# Patient Record
Sex: Male | Born: 1940
Health system: Southern US, Community
[De-identification: ages and names within clinical notes are randomized; demographics above are authoritative.]

## PROBLEM LIST (undated history)

## (undated) DIAGNOSIS — G2581 Restless legs syndrome: Secondary | ICD-10-CM

## (undated) DIAGNOSIS — M545 Low back pain, unspecified: Secondary | ICD-10-CM

## (undated) DIAGNOSIS — C4491 Basal cell carcinoma of skin, unspecified: Secondary | ICD-10-CM

## (undated) DIAGNOSIS — L859 Epidermal thickening, unspecified: Secondary | ICD-10-CM

## (undated) DIAGNOSIS — C439 Malignant melanoma of skin, unspecified: Secondary | ICD-10-CM

## (undated) DIAGNOSIS — Z973 Presence of spectacles and contact lenses: Secondary | ICD-10-CM

## (undated) DIAGNOSIS — R42 Dizziness and giddiness: Secondary | ICD-10-CM

## (undated) DIAGNOSIS — Z720 Tobacco use: Secondary | ICD-10-CM

## (undated) DIAGNOSIS — M1990 Unspecified osteoarthritis, unspecified site: Secondary | ICD-10-CM

## (undated) DIAGNOSIS — H3530 Unspecified macular degeneration: Secondary | ICD-10-CM

## (undated) DIAGNOSIS — K219 Gastro-esophageal reflux disease without esophagitis: Secondary | ICD-10-CM

## (undated) DIAGNOSIS — K5730 Diverticulosis of large intestine without perforation or abscess without bleeding: Secondary | ICD-10-CM

## (undated) DIAGNOSIS — F1020 Alcohol dependence, uncomplicated: Secondary | ICD-10-CM

## (undated) DIAGNOSIS — Z8673 Personal history of transient ischemic attack (TIA), and cerebral infarction without residual deficits: Secondary | ICD-10-CM

## (undated) DIAGNOSIS — Z972 Presence of dental prosthetic device (complete) (partial): Secondary | ICD-10-CM

## (undated) DIAGNOSIS — Z9989 Dependence on other enabling machines and devices: Secondary | ICD-10-CM

## (undated) DIAGNOSIS — N400 Enlarged prostate without lower urinary tract symptoms: Secondary | ICD-10-CM

## (undated) DIAGNOSIS — F5104 Psychophysiologic insomnia: Secondary | ICD-10-CM

## (undated) DIAGNOSIS — S42402A Unspecified fracture of lower end of left humerus, initial encounter for closed fracture: Secondary | ICD-10-CM

## (undated) DIAGNOSIS — F329 Major depressive disorder, single episode, unspecified: Secondary | ICD-10-CM

## (undated) HISTORY — DX: Diverticulosis of large intestine without perforation or abscess without bleeding: K57.30

## (undated) HISTORY — DX: Restless legs syndrome: G25.81

## (undated) HISTORY — DX: Dizziness and giddiness: R42

## (undated) HISTORY — DX: Low back pain, unspecified: M54.50

## (undated) HISTORY — DX: Epidermal thickening, unspecified: L85.9

## (undated) HISTORY — PX: CATARACT EXTRACTION: SUR2

## (undated) HISTORY — DX: Basal cell carcinoma of skin, unspecified: C44.91

## (undated) HISTORY — DX: Alcohol dependence, uncomplicated: F10.20

## (undated) HISTORY — DX: Tobacco use: Z72.0

## (undated) HISTORY — DX: Major depressive disorder, single episode, unspecified: F32.9

## (undated) HISTORY — PX: TONSILLECTOMY: SUR1361

## (undated) HISTORY — DX: Psychophysiologic insomnia: F51.04

## (undated) HISTORY — PX: OTHER SURGICAL HISTORY: SHX169

## (undated) HISTORY — DX: Personal history of transient ischemic attack (TIA), and cerebral infarction without residual deficits: Z86.73

## (undated) HISTORY — DX: Malignant melanoma of skin, unspecified: C43.9

## (undated) HISTORY — DX: Low back pain: M54.5

## (undated) HISTORY — DX: Unspecified osteoarthritis, unspecified site: M19.90

## (undated) HISTORY — DX: Unspecified fracture of lower end of left humerus, initial encounter for closed fracture: S42.402A

---

## 2001-08-13 ENCOUNTER — Ambulatory Visit (HOSPITAL_COMMUNITY): Admission: RE | Admit: 2001-08-13 | Discharge: 2001-08-13 | Payer: Self-pay | Admitting: *Deleted

## 2001-08-13 ENCOUNTER — Encounter: Payer: Self-pay | Admitting: *Deleted

## 2002-06-28 ENCOUNTER — Emergency Department (HOSPITAL_COMMUNITY): Admission: EM | Admit: 2002-06-28 | Discharge: 2002-06-28 | Payer: Self-pay | Admitting: Emergency Medicine

## 2003-02-26 ENCOUNTER — Encounter: Payer: Self-pay | Admitting: Internal Medicine

## 2003-02-26 ENCOUNTER — Ambulatory Visit (HOSPITAL_COMMUNITY): Admission: RE | Admit: 2003-02-26 | Discharge: 2003-02-26 | Payer: Self-pay | Admitting: Internal Medicine

## 2003-07-04 DIAGNOSIS — I639 Cerebral infarction, unspecified: Secondary | ICD-10-CM

## 2003-07-04 HISTORY — DX: Cerebral infarction, unspecified: I63.9

## 2003-09-24 ENCOUNTER — Ambulatory Visit (HOSPITAL_COMMUNITY): Admission: RE | Admit: 2003-09-24 | Discharge: 2003-09-24 | Payer: Self-pay | Admitting: Internal Medicine

## 2004-03-21 ENCOUNTER — Ambulatory Visit (HOSPITAL_COMMUNITY): Admission: RE | Admit: 2004-03-21 | Discharge: 2004-03-21 | Payer: Self-pay | Admitting: Internal Medicine

## 2004-09-07 ENCOUNTER — Ambulatory Visit (HOSPITAL_COMMUNITY): Admission: RE | Admit: 2004-09-07 | Discharge: 2004-09-07 | Payer: Self-pay | Admitting: Internal Medicine

## 2005-02-20 ENCOUNTER — Ambulatory Visit (HOSPITAL_COMMUNITY): Admission: RE | Admit: 2005-02-20 | Discharge: 2005-02-20 | Payer: Self-pay | Admitting: Internal Medicine

## 2005-02-23 ENCOUNTER — Ambulatory Visit: Payer: Self-pay

## 2005-05-18 ENCOUNTER — Inpatient Hospital Stay (HOSPITAL_COMMUNITY): Admission: AD | Admit: 2005-05-18 | Discharge: 2005-05-19 | Payer: Self-pay | Admitting: *Deleted

## 2005-10-13 ENCOUNTER — Ambulatory Visit (HOSPITAL_COMMUNITY): Admission: RE | Admit: 2005-10-13 | Discharge: 2005-10-13 | Payer: Self-pay | Admitting: Ophthalmology

## 2006-08-28 ENCOUNTER — Ambulatory Visit (HOSPITAL_COMMUNITY): Admission: RE | Admit: 2006-08-28 | Discharge: 2006-08-28 | Payer: Self-pay | Admitting: Internal Medicine

## 2007-07-04 DIAGNOSIS — S42402A Unspecified fracture of lower end of left humerus, initial encounter for closed fracture: Secondary | ICD-10-CM

## 2007-07-04 DIAGNOSIS — C439 Malignant melanoma of skin, unspecified: Secondary | ICD-10-CM

## 2007-07-04 HISTORY — PX: MELANOMA EXCISION: SHX5266

## 2007-07-04 HISTORY — PX: RECONSTRUCTION MEDIAL COLLATERAL LIGAMENT ELBOW W/ TENDON GRAFT: SUR1084

## 2007-07-04 HISTORY — DX: Unspecified fracture of lower end of left humerus, initial encounter for closed fracture: S42.402A

## 2007-07-04 HISTORY — DX: Malignant melanoma of skin, unspecified: C43.9

## 2007-12-19 ENCOUNTER — Ambulatory Visit: Payer: Self-pay | Admitting: Internal Medicine

## 2007-12-19 ENCOUNTER — Ambulatory Visit: Payer: Self-pay | Admitting: Cardiology

## 2007-12-19 ENCOUNTER — Inpatient Hospital Stay (HOSPITAL_COMMUNITY): Admission: EM | Admit: 2007-12-19 | Discharge: 2007-12-25 | Payer: Self-pay | Admitting: Emergency Medicine

## 2007-12-23 ENCOUNTER — Encounter (INDEPENDENT_AMBULATORY_CARE_PROVIDER_SITE_OTHER): Payer: Self-pay | Admitting: Orthopedic Surgery

## 2007-12-23 ENCOUNTER — Ambulatory Visit: Payer: Self-pay | Admitting: Vascular Surgery

## 2007-12-23 ENCOUNTER — Encounter: Payer: Self-pay | Admitting: Internal Medicine

## 2007-12-23 DIAGNOSIS — M1990 Unspecified osteoarthritis, unspecified site: Secondary | ICD-10-CM | POA: Insufficient documentation

## 2007-12-23 DIAGNOSIS — S42309A Unspecified fracture of shaft of humerus, unspecified arm, initial encounter for closed fracture: Secondary | ICD-10-CM | POA: Insufficient documentation

## 2007-12-23 DIAGNOSIS — F329 Major depressive disorder, single episode, unspecified: Secondary | ICD-10-CM

## 2007-12-23 DIAGNOSIS — G4700 Insomnia, unspecified: Secondary | ICD-10-CM | POA: Insufficient documentation

## 2007-12-23 DIAGNOSIS — F32A Depression, unspecified: Secondary | ICD-10-CM | POA: Insufficient documentation

## 2007-12-23 DIAGNOSIS — R55 Syncope and collapse: Secondary | ICD-10-CM | POA: Insufficient documentation

## 2007-12-23 DIAGNOSIS — R0989 Other specified symptoms and signs involving the circulatory and respiratory systems: Secondary | ICD-10-CM | POA: Insufficient documentation

## 2007-12-23 DIAGNOSIS — R42 Dizziness and giddiness: Secondary | ICD-10-CM | POA: Insufficient documentation

## 2007-12-23 HISTORY — DX: Unspecified fracture of shaft of humerus, unspecified arm, initial encounter for closed fracture: S42.309A

## 2007-12-26 ENCOUNTER — Telehealth (INDEPENDENT_AMBULATORY_CARE_PROVIDER_SITE_OTHER): Payer: Self-pay | Admitting: *Deleted

## 2008-01-21 ENCOUNTER — Ambulatory Visit: Payer: Self-pay | Admitting: Internal Medicine

## 2008-01-21 DIAGNOSIS — E559 Vitamin D deficiency, unspecified: Secondary | ICD-10-CM | POA: Insufficient documentation

## 2008-02-24 ENCOUNTER — Ambulatory Visit: Payer: Self-pay | Admitting: Internal Medicine

## 2008-02-24 DIAGNOSIS — K602 Anal fissure, unspecified: Secondary | ICD-10-CM | POA: Insufficient documentation

## 2008-02-24 DIAGNOSIS — R509 Fever, unspecified: Secondary | ICD-10-CM | POA: Insufficient documentation

## 2008-02-26 ENCOUNTER — Ambulatory Visit: Payer: Self-pay | Admitting: Internal Medicine

## 2008-02-26 DIAGNOSIS — N390 Urinary tract infection, site not specified: Secondary | ICD-10-CM | POA: Insufficient documentation

## 2008-02-26 LAB — CONVERTED CEMR LAB
ALT: 28 units/L (ref 0–53)
AST: 23 units/L (ref 0–37)
Albumin: 3.6 g/dL (ref 3.5–5.2)
Alkaline Phosphatase: 73 units/L (ref 39–117)
BUN: 25 mg/dL — ABNORMAL HIGH (ref 6–23)
Basophils Absolute: 0.1 10*3/uL (ref 0.0–0.1)
Basophils Relative: 1.3 % (ref 0.0–3.0)
Bilirubin, Direct: 0.3 mg/dL (ref 0.0–0.3)
CO2: 27 meq/L (ref 19–32)
Calcium: 9 mg/dL (ref 8.4–10.5)
Chloride: 107 meq/L (ref 96–112)
Creatinine, Ser: 1.6 mg/dL — ABNORMAL HIGH (ref 0.4–1.5)
Crystals: NEGATIVE
Eosinophils Absolute: 0.3 10*3/uL (ref 0.0–0.7)
Eosinophils Relative: 4.6 % (ref 0.0–5.0)
GFR calc Af Amer: 56 mL/min
GFR calc non Af Amer: 46 mL/min
Glucose, Bld: 112 mg/dL — ABNORMAL HIGH (ref 70–99)
HCT: 36.7 % — ABNORMAL LOW (ref 39.0–52.0)
Hemoglobin: 12.8 g/dL — ABNORMAL LOW (ref 13.0–17.0)
Leukocytes, UA: NEGATIVE
Lymphocytes Relative: 9.7 % — ABNORMAL LOW (ref 12.0–46.0)
MCHC: 34.9 g/dL (ref 30.0–36.0)
MCV: 87.8 fL (ref 78.0–100.0)
Monocytes Absolute: 0.4 10*3/uL (ref 0.1–1.0)
Monocytes Relative: 6.8 % (ref 3.0–12.0)
Mucus, UA: NEGATIVE
Neutro Abs: 4.3 10*3/uL (ref 1.4–7.7)
Neutrophils Relative %: 77.6 % — ABNORMAL HIGH (ref 43.0–77.0)
Nitrite: NEGATIVE
Platelets: 242 10*3/uL (ref 150–400)
Potassium: 4 meq/L (ref 3.5–5.1)
RBC: 4.18 M/uL — ABNORMAL LOW (ref 4.22–5.81)
RDW: 13.2 % (ref 11.5–14.6)
Sodium: 140 meq/L (ref 135–145)
Specific Gravity, Urine: 1.025 (ref 1.000–1.03)
Total Bilirubin: 1 mg/dL (ref 0.3–1.2)
Total Protein, Urine: 100 mg/dL — AB
Total Protein: 6.9 g/dL (ref 6.0–8.3)
Urine Glucose: NEGATIVE mg/dL
Urobilinogen, UA: 0.2 (ref 0.0–1.0)
WBC: 5.7 10*3/uL (ref 4.5–10.5)
pH: 5 (ref 5.0–8.0)

## 2008-04-15 ENCOUNTER — Ambulatory Visit: Payer: Self-pay | Admitting: Internal Medicine

## 2008-04-15 LAB — CONVERTED CEMR LAB
BUN: 15 mg/dL (ref 6–23)
Basophils Absolute: 0 10*3/uL (ref 0.0–0.1)
Basophils Relative: 0.8 % (ref 0.0–3.0)
Bilirubin Urine: NEGATIVE
CO2: 30 meq/L (ref 19–32)
Calcium: 9.3 mg/dL (ref 8.4–10.5)
Chloride: 106 meq/L (ref 96–112)
Creatinine, Ser: 1 mg/dL (ref 0.4–1.5)
Eosinophils Absolute: 0.2 10*3/uL (ref 0.0–0.7)
Eosinophils Relative: 2.6 % (ref 0.0–5.0)
GFR calc Af Amer: 96 mL/min
GFR calc non Af Amer: 79 mL/min
Glucose, Bld: 91 mg/dL (ref 70–99)
HCT: 37.1 % — ABNORMAL LOW (ref 39.0–52.0)
Hemoglobin, Urine: NEGATIVE
Hemoglobin: 12.8 g/dL — ABNORMAL LOW (ref 13.0–17.0)
Ketones, ur: NEGATIVE mg/dL
Leukocytes, UA: NEGATIVE
Lymphocytes Relative: 23.7 % (ref 12.0–46.0)
MCHC: 34.5 g/dL (ref 30.0–36.0)
MCV: 87.1 fL (ref 78.0–100.0)
Monocytes Absolute: 0.4 10*3/uL (ref 0.1–1.0)
Monocytes Relative: 7.4 % (ref 3.0–12.0)
Neutro Abs: 4 10*3/uL (ref 1.4–7.7)
Neutrophils Relative %: 65.5 % (ref 43.0–77.0)
Nitrite: NEGATIVE
Platelets: 283 10*3/uL (ref 150–400)
Potassium: 4 meq/L (ref 3.5–5.1)
RBC: 4.26 M/uL (ref 4.22–5.81)
RDW: 12.6 % (ref 11.5–14.6)
Sodium: 141 meq/L (ref 135–145)
Specific Gravity, Urine: >=1.03 (ref 1.000–1.03)
TSH: 2.4 microintl units/mL (ref 0.35–5.50)
Total Protein, Urine: NEGATIVE mg/dL
Urine Glucose: NEGATIVE mg/dL
Urobilinogen, UA: 0.2 (ref 0.0–1.0)
Vit D, 1,25-Dihydroxy: 63 (ref 30–89)
Vitamin B-12: 492 pg/mL (ref 211–911)
WBC: 6 10*3/uL (ref 4.5–10.5)
pH: 5 (ref 5.0–8.0)

## 2008-04-22 ENCOUNTER — Ambulatory Visit: Payer: Self-pay | Admitting: Internal Medicine

## 2008-04-22 DIAGNOSIS — G629 Polyneuropathy, unspecified: Secondary | ICD-10-CM | POA: Insufficient documentation

## 2008-05-05 ENCOUNTER — Telehealth: Payer: Self-pay | Admitting: Internal Medicine

## 2008-07-29 ENCOUNTER — Ambulatory Visit: Payer: Self-pay | Admitting: Internal Medicine

## 2008-08-04 ENCOUNTER — Telehealth (INDEPENDENT_AMBULATORY_CARE_PROVIDER_SITE_OTHER): Payer: Self-pay | Admitting: *Deleted

## 2008-09-30 ENCOUNTER — Telehealth (INDEPENDENT_AMBULATORY_CARE_PROVIDER_SITE_OTHER): Payer: Self-pay | Admitting: *Deleted

## 2008-10-01 ENCOUNTER — Ambulatory Visit: Payer: Self-pay | Admitting: Internal Medicine

## 2008-10-01 DIAGNOSIS — R609 Edema, unspecified: Secondary | ICD-10-CM | POA: Insufficient documentation

## 2008-10-01 DIAGNOSIS — L259 Unspecified contact dermatitis, unspecified cause: Secondary | ICD-10-CM | POA: Insufficient documentation

## 2008-10-01 DIAGNOSIS — M79609 Pain in unspecified limb: Secondary | ICD-10-CM | POA: Insufficient documentation

## 2008-10-21 ENCOUNTER — Ambulatory Visit: Payer: Self-pay | Admitting: Internal Medicine

## 2008-10-21 LAB — CONVERTED CEMR LAB
BUN: 17 mg/dL (ref 6–23)
CO2: 30 meq/L (ref 19–32)
Calcium: 9.2 mg/dL (ref 8.4–10.5)
Chloride: 105 meq/L (ref 96–112)
Creatinine, Ser: 1 mg/dL (ref 0.4–1.5)
GFR calc non Af Amer: 78.64 mL/min (ref >60)
Glucose, Bld: 91 mg/dL (ref 70–99)
Potassium: 4.1 meq/L (ref 3.5–5.1)
Sodium: 141 meq/L (ref 135–145)
TSH: 3.34 microintl units/mL (ref 0.35–5.50)
Vit D, 25-Hydroxy: 49 ng/mL (ref 30–89)
Vitamin B-12: 400 pg/mL (ref 211–911)

## 2008-10-28 ENCOUNTER — Ambulatory Visit: Payer: Self-pay | Admitting: Internal Medicine

## 2008-10-28 DIAGNOSIS — G2581 Restless legs syndrome: Secondary | ICD-10-CM | POA: Insufficient documentation

## 2008-12-28 ENCOUNTER — Ambulatory Visit: Payer: Self-pay | Admitting: Internal Medicine

## 2009-01-29 ENCOUNTER — Telehealth: Payer: Self-pay | Admitting: Internal Medicine

## 2009-03-26 ENCOUNTER — Ambulatory Visit: Payer: Self-pay | Admitting: Internal Medicine

## 2009-03-26 DIAGNOSIS — K5732 Diverticulitis of large intestine without perforation or abscess without bleeding: Secondary | ICD-10-CM | POA: Insufficient documentation

## 2009-03-26 DIAGNOSIS — R109 Unspecified abdominal pain: Secondary | ICD-10-CM | POA: Insufficient documentation

## 2009-03-26 LAB — CONVERTED CEMR LAB
ALT: 16 units/L (ref 0–53)
AST: 16 units/L (ref 0–37)
Albumin: 3.9 g/dL (ref 3.5–5.2)
Alkaline Phosphatase: 70 units/L (ref 39–117)
BUN: 15 mg/dL (ref 6–23)
Basophils Absolute: 0.1 10*3/uL (ref 0.0–0.1)
Basophils Relative: 0.6 % (ref 0.0–3.0)
Bilirubin Urine: NEGATIVE
Bilirubin, Direct: 0.1 mg/dL (ref 0.0–0.3)
CO2: 28 meq/L (ref 19–32)
Calcium: 9.5 mg/dL (ref 8.4–10.5)
Chloride: 104 meq/L (ref 96–112)
Creatinine, Ser: 1 mg/dL (ref 0.4–1.5)
Eosinophils Absolute: 0.1 10*3/uL (ref 0.0–0.7)
Eosinophils Relative: 1.5 % (ref 0.0–5.0)
GFR calc non Af Amer: 78.54 mL/min (ref >60)
Glucose, Bld: 101 mg/dL — ABNORMAL HIGH (ref 70–99)
HCT: 37.3 % — ABNORMAL LOW (ref 39.0–52.0)
Hemoglobin: 12.8 g/dL — ABNORMAL LOW (ref 13.0–17.0)
Ketones, ur: NEGATIVE mg/dL
Leukocytes, UA: NEGATIVE
Lymphocytes Relative: 19.4 % (ref 12.0–46.0)
Lymphs Abs: 1.7 10*3/uL (ref 0.7–4.0)
MCHC: 34.2 g/dL (ref 30.0–36.0)
MCV: 89 fL (ref 78.0–100.0)
Monocytes Absolute: 0.5 10*3/uL (ref 0.1–1.0)
Monocytes Relative: 5.5 % (ref 3.0–12.0)
Neutro Abs: 6.6 10*3/uL (ref 1.4–7.7)
Neutrophils Relative %: 73 % (ref 43.0–77.0)
Nitrite: NEGATIVE
Platelets: 313 10*3/uL (ref 150.0–400.0)
Potassium: 4.5 meq/L (ref 3.5–5.1)
RBC: 4.19 M/uL — ABNORMAL LOW (ref 4.22–5.81)
RDW: 12.6 % (ref 11.5–14.6)
Sed Rate: 47 mm/hr — ABNORMAL HIGH (ref 0–22)
Sodium: 139 meq/L (ref 135–145)
Specific Gravity, Urine: 1.025 (ref 1.000–1.030)
Total Bilirubin: 0.9 mg/dL (ref 0.3–1.2)
Total Protein, Urine: NEGATIVE mg/dL
Total Protein: 7.8 g/dL (ref 6.0–8.3)
Urine Glucose: NEGATIVE mg/dL
Urobilinogen, UA: 0.2 (ref 0.0–1.0)
WBC: 9 10*3/uL (ref 4.5–10.5)
pH: 5.5 (ref 5.0–8.0)

## 2009-03-28 DIAGNOSIS — K5730 Diverticulosis of large intestine without perforation or abscess without bleeding: Secondary | ICD-10-CM | POA: Insufficient documentation

## 2009-03-31 ENCOUNTER — Ambulatory Visit: Payer: Self-pay | Admitting: Internal Medicine

## 2009-04-19 ENCOUNTER — Telehealth: Payer: Self-pay | Admitting: Internal Medicine

## 2009-07-27 ENCOUNTER — Ambulatory Visit: Payer: Self-pay | Admitting: Internal Medicine

## 2009-07-27 DIAGNOSIS — Z87891 Personal history of nicotine dependence: Secondary | ICD-10-CM | POA: Insufficient documentation

## 2009-07-27 DIAGNOSIS — M545 Low back pain, unspecified: Secondary | ICD-10-CM | POA: Insufficient documentation

## 2009-10-19 ENCOUNTER — Telehealth: Payer: Self-pay | Admitting: Internal Medicine

## 2009-10-25 ENCOUNTER — Ambulatory Visit: Payer: Self-pay | Admitting: Internal Medicine

## 2009-10-25 LAB — CONVERTED CEMR LAB
ALT: 15 units/L (ref 0–53)
AST: 22 units/L (ref 0–37)
Albumin: 4.4 g/dL (ref 3.5–5.2)
Alkaline Phosphatase: 59 units/L (ref 39–117)
BUN: 16 mg/dL (ref 6–23)
Bilirubin, Direct: 0.1 mg/dL (ref 0.0–0.3)
CO2: 28 meq/L (ref 19–32)
Calcium: 9.4 mg/dL (ref 8.4–10.5)
Chloride: 105 meq/L (ref 96–112)
Creatinine, Ser: 1.1 mg/dL (ref 0.4–1.5)
GFR calc non Af Amer: 70.25 mL/min (ref >60)
Glucose, Bld: 78 mg/dL (ref 70–99)
Potassium: 4.5 meq/L (ref 3.5–5.1)
Sodium: 142 meq/L (ref 135–145)
Total Bilirubin: 0.6 mg/dL (ref 0.3–1.2)
Total Protein: 7.3 g/dL (ref 6.0–8.3)

## 2009-10-27 ENCOUNTER — Ambulatory Visit: Payer: Self-pay | Admitting: Internal Medicine

## 2010-01-17 ENCOUNTER — Telehealth: Payer: Self-pay | Admitting: Internal Medicine

## 2010-03-01 ENCOUNTER — Ambulatory Visit: Payer: Self-pay | Admitting: Internal Medicine

## 2010-03-01 DIAGNOSIS — R079 Chest pain, unspecified: Secondary | ICD-10-CM | POA: Insufficient documentation

## 2010-03-02 ENCOUNTER — Telehealth (INDEPENDENT_AMBULATORY_CARE_PROVIDER_SITE_OTHER): Payer: Self-pay | Admitting: *Deleted

## 2010-03-02 LAB — CONVERTED CEMR LAB
BUN: 21 mg/dL (ref 6–23)
Basophils Absolute: 0 10*3/uL (ref 0.0–0.1)
Basophils Relative: 0.4 % (ref 0.0–3.0)
Bilirubin Urine: NEGATIVE
CO2: 28 meq/L (ref 19–32)
Calcium: 9.5 mg/dL (ref 8.4–10.5)
Chloride: 105 meq/L (ref 96–112)
Creatinine, Ser: 1.4 mg/dL (ref 0.4–1.5)
Eosinophils Absolute: 0.3 10*3/uL (ref 0.0–0.7)
Eosinophils Relative: 3.7 % (ref 0.0–5.0)
GFR calc non Af Amer: 54.47 mL/min (ref >60)
Glucose, Bld: 74 mg/dL (ref 70–99)
HCT: 37.3 % — ABNORMAL LOW (ref 39.0–52.0)
Hemoglobin, Urine: NEGATIVE
Hemoglobin: 12.8 g/dL — ABNORMAL LOW (ref 13.0–17.0)
Ketones, ur: NEGATIVE mg/dL
Leukocytes, UA: NEGATIVE
Lymphocytes Relative: 17 % (ref 12.0–46.0)
Lymphs Abs: 1.5 10*3/uL (ref 0.7–4.0)
MCHC: 34.3 g/dL (ref 30.0–36.0)
MCV: 90.2 fL (ref 78.0–100.0)
Monocytes Absolute: 0.7 10*3/uL (ref 0.1–1.0)
Monocytes Relative: 8.1 % (ref 3.0–12.0)
Neutro Abs: 6 10*3/uL (ref 1.4–7.7)
Neutrophils Relative %: 70.8 % (ref 43.0–77.0)
Nitrite: NEGATIVE
Platelets: 244 10*3/uL (ref 150.0–400.0)
Potassium: 5.2 meq/L — ABNORMAL HIGH (ref 3.5–5.1)
RBC: 4.14 M/uL — ABNORMAL LOW (ref 4.22–5.81)
RDW: 13.5 % (ref 11.5–14.6)
Sed Rate: 23 mm/hr — ABNORMAL HIGH (ref 0–22)
Sodium: 141 meq/L (ref 135–145)
Specific Gravity, Urine: >=1.03 (ref 1.000–1.030)
Total Protein, Urine: NEGATIVE mg/dL
Urine Glucose: NEGATIVE mg/dL
Urobilinogen, UA: 0.2 (ref 0.0–1.0)
WBC: 8.5 10*3/uL (ref 4.5–10.5)
pH: 5 (ref 5.0–8.0)

## 2010-03-03 ENCOUNTER — Ambulatory Visit: Payer: Self-pay | Admitting: Cardiology

## 2010-03-24 ENCOUNTER — Ambulatory Visit: Payer: Self-pay | Admitting: Internal Medicine

## 2010-03-24 DIAGNOSIS — J019 Acute sinusitis, unspecified: Secondary | ICD-10-CM | POA: Insufficient documentation

## 2010-03-25 ENCOUNTER — Telehealth (INDEPENDENT_AMBULATORY_CARE_PROVIDER_SITE_OTHER): Payer: Self-pay | Admitting: *Deleted

## 2010-04-25 ENCOUNTER — Telehealth: Payer: Self-pay | Admitting: Internal Medicine

## 2010-05-19 ENCOUNTER — Telehealth: Payer: Self-pay | Admitting: Internal Medicine

## 2010-06-14 ENCOUNTER — Telehealth: Payer: Self-pay | Admitting: Internal Medicine

## 2010-06-23 ENCOUNTER — Ambulatory Visit: Payer: Self-pay | Admitting: Internal Medicine

## 2010-06-23 LAB — CONVERTED CEMR LAB
ALT: 12 units/L (ref 0–53)
AST: 18 units/L (ref 0–37)
Albumin: 4 g/dL (ref 3.5–5.2)
Alkaline Phosphatase: 54 units/L (ref 39–117)
BUN: 18 mg/dL (ref 6–23)
Basophils Absolute: 0 10*3/uL (ref 0.0–0.1)
Basophils Relative: 0.3 % (ref 0.0–3.0)
Bilirubin Urine: NEGATIVE
Bilirubin, Direct: 0.1 mg/dL (ref 0.0–0.3)
Blood, UA: NEGATIVE
CO2: 27 meq/L (ref 19–32)
Calcium: 9.2 mg/dL (ref 8.4–10.5)
Chloride: 106 meq/L (ref 96–112)
Cholesterol: 143 mg/dL (ref 0–200)
Creatinine, Ser: 1 mg/dL (ref 0.4–1.5)
Eosinophils Absolute: 0.3 10*3/uL (ref 0.0–0.7)
Eosinophils Relative: 3.5 % (ref 0.0–5.0)
GFR calc non Af Amer: 83.04 mL/min (ref >60.00)
Glucose, Bld: 77 mg/dL (ref 70–99)
HCT: 36.8 % — ABNORMAL LOW (ref 39.0–52.0)
HDL: 40.6 mg/dL (ref >39.00)
Hemoglobin: 12.7 g/dL — ABNORMAL LOW (ref 13.0–17.0)
Ketones, ur: NEGATIVE mg/dL
LDL Cholesterol: 93 mg/dL (ref 0–99)
Leukocytes, UA: NEGATIVE
Lymphocytes Relative: 19.4 % (ref 12.0–46.0)
Lymphs Abs: 1.5 10*3/uL (ref 0.7–4.0)
MCHC: 34.5 g/dL (ref 30.0–36.0)
MCV: 89 fL (ref 78.0–100.0)
Monocytes Absolute: 0.5 10*3/uL (ref 0.1–1.0)
Monocytes Relative: 6.7 % (ref 3.0–12.0)
Neutro Abs: 5.6 10*3/uL (ref 1.4–7.7)
Neutrophils Relative %: 70.1 % (ref 43.0–77.0)
Nitrite: NEGATIVE
PSA: 2.65 ng/mL (ref 0.10–4.00)
Platelets: 252 10*3/uL (ref 150.0–400.0)
Potassium: 4.3 meq/L (ref 3.5–5.1)
RBC: 4.13 M/uL — ABNORMAL LOW (ref 4.22–5.81)
RDW: 13.9 % (ref 11.5–14.6)
Sodium: 139 meq/L (ref 135–145)
Specific Gravity, Urine: 1.025 (ref 1.000–1.030)
TSH: 4.98 microintl units/mL (ref 0.35–5.50)
Total Bilirubin: 0.8 mg/dL (ref 0.3–1.2)
Total CHOL/HDL Ratio: 4
Total Protein, Urine: NEGATIVE mg/dL
Total Protein: 6.8 g/dL (ref 6.0–8.3)
Triglycerides: 46 mg/dL (ref 0.0–149.0)
Urine Glucose: NEGATIVE mg/dL
Urobilinogen, UA: 0.2 (ref 0.0–1.0)
VLDL: 9.2 mg/dL (ref 0.0–40.0)
WBC: 8 10*3/uL (ref 4.5–10.5)
pH: 5 (ref 5.0–8.0)

## 2010-08-02 NOTE — Assessment & Plan Note (Signed)
Summary: 2 MO ROV /NWS $50   Vital Signs:  Patient Profile:   70 Years Old Male Height:     72 inches Weight:      186 pounds Temp:     96.5 degrees F oral Pulse rate:   88 / minute BP sitting:   106 / 68  (left arm)  Vitals Entered By: Tora Perches (April 22, 2008 10:10 AM)                 Chief Complaint:  Multiple medical problems or concerns.    Current Allergies: LEVAQUIN  Past Medical History:    Reviewed history from 01/21/2008 and no changes required:       Osteoarthritis       Depression       Melanoma x3 2009 Dr Amy [place]       Basal Cell Carcinoma - right chest       Chronic Insomnia on Ambien x 2 years       ? Restless leg syndrom       History of TIA's       History of dizzy spells       History of tinnitus        Glaucoma       History of alcoholism (dry 13 years)       History of tobacco abuse       L elbow Fx Dr Carola Frost 2009   Family History:    Reviewed history from 12/23/2007 and no changes required:       Father deceased age 77 -Lung cancer       Mother deceased ? 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  Also worked as Research officer, trade union.       Married for 45 years       2 children     Physical Exam  General:     Well-developed,well-nourished,in no acute distress; alert,appropriate and cooperative throughout examination Ears:     External ear exam shows no significant lesions or deformities.  Otoscopic examination reveals clear canals, tympanic membranes are intact bilaterally without bulging, retraction, inflammation or discharge. Hearing is grossly normal bilaterally. Mouth:     Oral mucosa and oropharynx without lesions or exudates.  Teeth in good repair. Neck:     No deformities, masses, or tenderness  noted. Lungs:     Normal respiratory effort, chest expands symmetrically. Lungs are clear to auscultation, no crackles or wheezes. Heart:     Normal rate and regular rhythm. S1 and S2 normal without gallop, murmur, click, rub or other extra sounds. Abdomen:     Bowel sounds positive,abdomen soft and non-tender without masses, organomegaly or hernias noted. Msk:     L hand is in a brace Neurologic:     No cranial nerve deficits noted. Station and gait are normal. Plantar reflexes are down-going bilaterally. DTRs are symmetrical throughout. Sensory, motor and coordinative functions appear intact. Skin:     Intact without suspicious lesions or rashes Psych:     Cognition and judgment appear intact. Alert and cooperative with normal attention span and concentration. No apparent delusions, illusions, hallucinations    Impression &  Recommendations:  Problem # 1:  FRACTURE, ARM, LEFT (ICD-818.0) Assessment: Unchanged On prescription therapy   Problem # 2:  VITAMIN D DEFICIENCY (ICD-268.9) Assessment: Improved On prescription therapy   Problem # 3:  NEUROPATHY, OTHER INFLAMMATORY AND TOXIC (ICD-357.89) L arm Assessment: Deteriorated Try Gabapentin as dirr  Problem # 4:  DIZZINESS (ICD-780.4) Assessment: Improved  Problem # 5:  OSTEOARTHRITIS (ICD-715.90) Assessment: Unchanged  His updated medication list for this problem includes:    Vicodin 5-500 Mg Tabs (Hydrocodone-acetaminophen) ..... One by mouth qid prn    Aspirin 81 Mg Tbec (Aspirin) ..... One by mouth every day   Complete Medication List: 1)  Travatan 0.004 % Soln (Travoprost) .... One drop op qhs 2)  Vicodin 5-500 Mg Tabs (Hydrocodone-acetaminophen) .... One by mouth qid prn 3)  Vitamin D3 1000 Unit Tabs (Cholecalciferol) .[name].. 1 by mouth daily 4)  Aspirin 81 Mg Tbec (Aspirin) .... One by mouth every day 5)  Multivitamins Tabs (Multiple vitamin) .... Once daily 6)  Triamcinolone Acetonide 0.1 % Oint (Triamcinolone  acetonide) .... Use  qid prn 7)  Gabapentin 100 Mg Caps (Gabapentin) .[name].. 1 by mouth qid as needed pain 8)  Miralax Powd (Polyethylene glycol 3350) .[name].. 17g by mouth once daily as needed constipation  Other Orders: Admin 1st Vaccine (46962) Flu Vaccine 49yrs + (95284)   Patient Instructions: 1)  Please schedule a follow-up appointment in 3 months.   Prescriptions: MIRALAX   POWD (POLYETHYLENE GLYCOL 3350) 17g by mouth once daily as needed constipation  #qs x 12   Entered and Authorized by:   Tresa Garter MD   Signed by:   Tresa Garter MD on 04/22/2008   Method used:   Print then Give to Patient   RxID:   [id number] GABAPENTIN 100 MG CAPS (GABAPENTIN) 1 by mouth qid as needed pain  #120 x 6   Entered and Authorized by:   Tresa Garter MD   Signed by:   Tresa Garter MD on 04/22/2008   Method used:   Print then Give to Patient   RxID:   [id number]  ]  Influenza Vaccine (to be given today)           Flu Vaccine Consent Questions     Do you have a history of severe allergic reactions to this vaccine? no    Any prior history of allergic reactions to egg and/or gelatin? no    Do you have a sensitivity to the preservative Thimersol? no    Do you have a past history of Guillan-Barre Syndrome? no    Do you currently have an acute febrile illness? no    Have you ever had a severe reaction to latex? no    Vaccine information given and explained to patient? yes    Are you currently pregnant? no    Lot Number:[id number]   Exp Date:12/30/2008   Site Given  right Deltoid IMcflu

## 2010-08-02 NOTE — Progress Notes (Signed)
  Phone Note Other Incoming   Request: Send information Summary of Call: Request for records received from  [employer]. Request forwarded to [facility].     

## 2010-08-02 NOTE — Assessment & Plan Note (Signed)
Summary: 3 MO ROV /NWS $50   Vital Signs:  Patient profile:   70 year old male Weight:      186 pounds Temp:     97.6 degrees F oral Pulse rate:   86 / minute BP sitting:   114 / 70  (left arm)  Vitals Entered By: Tora Perches (March 31, 2009 9:25 AM) CC: f/u Is Patient Diabetic? No   CC:  f/u.  History of Present Illness: F/u diverticulitis - much better  Current Medications (verified): 1)  Travatan 0.004 %  Soln (Travoprost) .... One Drop Op Qhs 2)  Vitamin D3 1000 Unit  Tabs (Cholecalciferol) .[name].. 1 By Mouth Daily 3)  Aspirin 81 Mg  Tbec (Aspirin) .... One By Mouth Every Day 4)  Multivitamins  Tabs (Multiple Vitamin) .... Once Daily 5)  Triamcinolone Acetonide 0.1 % Oint (Triamcinolone Acetonide) .... Use  Qid Prn 6)  Miralax   Powd (Polyethylene Glycol 3350) .[name].. 17g By Mouth Once Daily As Needed Constipation 7)  Hydrocodone-Acetaminophen 5-325 Mg Tabs (Hydrocodone-Acetaminophen) .[name].. 1 By Mouth Up To 4 Times Per Day As Needed For Pain 8)  Hydromorphone Hcl 2 Mg Tabs (Hydromorphone Hcl) .... 2 Qhs 9)  Loratadine 10 Mg  Tabs (Loratadine) .... Once Daily As Needed Allergies 10)  Topicort 0.25 % Oint (Desoximetasone) .... Qid On Hands 11)  Triamc 0.5% Cream in Eucerin Lotion 1:10 .... Use Once Daily Prn 12)  Requip 0.5 Mg Tabs (Ropinirole Hcl) .[name].. 1-2 By Mouth At Bedtime As Needed Resless Legs 13)  Cefuroxime Axetil 500 Mg  Tabs (Cefuroxime Axetil) .[name].. 1 By Mouth 2 Times Daily 14)  Metronidazole 500 Mg  Tabs (Metronidazole) .[name].. 1 By Mouth Three Times A Day X 7 D  Allergies: 1)  Levaquin  Physical Exam  General:  Well-developed,well-nourished,in no acute distress; alert,appropriate and cooperative throughout examination Mouth:  Moist Lungs:  Normal respiratory effort, chest expands symmetrically. Lungs are clear to auscultation, no crackles or wheezes. Heart:  Normal rate and regular rhythm. S1 and S2 normal without gallop, murmur, click, rub or other extra  sounds. Abdomen:  S/NT Skin:  Intact without suspicious lesions or rashes   Impression & Recommendations:  Problem # 1:  ABDOMINAL PAIN, LOWER (ICD-789.09) due to #2 Assessment Improved  Problem # 2:  DIVERTICULITIS (ICD-562.11) Assessment: Improved Finish Rx  Complete Medication List: 1)  Travatan 0.004 % Soln (Travoprost) .... One drop op qhs 2)  Vitamin D3 1000 Unit Tabs (Cholecalciferol) .[name].. 1 by mouth daily 3)  Aspirin 81 Mg Tbec (Aspirin) .... One by mouth every day 4)  Multivitamins Tabs (Multiple vitamin) .... Once daily 5)  Triamcinolone Acetonide 0.1 % Oint (Triamcinolone acetonide) .... Use  qid prn 6)  Miralax Powd (Polyethylene glycol 3350) .[name].. 17g by mouth once daily as needed constipation 7)  Hydrocodone-acetaminophen 5-325 Mg Tabs (Hydrocodone-acetaminophen) .[name].. 1 by mouth up to 4 times per day as needed for pain 8)  Hydromorphone Hcl 2 Mg Tabs (Hydromorphone hcl) .... 2 qhs 9)  Loratadine 10 Mg Tabs (Loratadine) .... Once daily as needed allergies 10)  Topicort 0.25 % Oint (Desoximetasone) .... Qid on hands 11)  Triamc 0.5% Cream in Eucerin Lotion 1:10  .... Use once daily prn 12)  Requip 0.5 Mg Tabs (Ropinirole hcl) .[name].. 1-2 by mouth at bedtime as needed resless legs 13)  Cefuroxime Axetil 500 Mg Tabs (Cefuroxime axetil) .[name].. 1 by mouth 2 times daily 14)  Metronidazole 500 Mg Tabs (Metronidazole) .[name].. 1 by mouth three times  a day x 7 d  Other Orders: Flu Vaccine 63yrs + (04540) Administration Flu vaccine - MCR (J8119)  Patient Instructions: 1)  In 1 wk: Try to eat more raw plant food, fresh and dry fruit, raw almonds, leafy vegetables, whole foods and less red meat, less animal fat. Poultry and fish is better for you than pork and beef. Avoid processed foods (canned soups, hot dogs, sausage, bacon , frozen dinners). Avoid corn syrup, high fructose syrup or aspartam and Splenda  containing drinks. Honey, Agave and Stevia are better sweeteners. Make your own   dressing with olive oil, wine vinegar, lemon juce, garlic etc. for your salads. 2)  Please schedule a follow-up appointment in 4 months. Flu Vaccine Consent Questions     Do you have a history of severe allergic reactions to this vaccine? no    Any prior history of allergic reactions to egg and/or gelatin? no    Do you have a sensitivity to the preservative Thimersol? no    Do you have a past history of Guillan-Barre Syndrome? no    Do you currently have an acute febrile illness? no    Have you ever had a severe reaction to latex? no    Vaccine information given and explained to patient? yes    Are you currently pregnant? no    Lot Number:[id number]   Exp Date:09/30/2009   Site Given  right Deltoid IMflu   Influenza Vaccine (to be given today)   Appended Document: 3 MO ROV /NWS $50    Clinical Lists Changes  Observations: Changed observation from FLU VAXLOT: [id number] (03/31/2009 9:04) to FLU VAXLOT: [id number] (03/31/2009 9:04) Changed observation from FLU VAX EXP: 09/30/2008 (03/31/2009 9:04) to FLU VAX EXP: 12/30/2009 (03/31/2009 9:04)

## 2010-08-02 NOTE — Progress Notes (Signed)
Summary: Rf Hydroco/Acetamin  Phone Note Refill Request Message from:  Fax from Pharmacy  Refills Requested: Medication #1:  HYDROCODONE-ACETAMINOPHEN 7.5-325 MG TABS 1 by mouth qid as needed pain   Dosage confirmed as above?Dosage Confirmed   Supply Requested: 1 month   Last Refilled: 12/22/2009  Method Requested: Telephone to Pharmacy Next Appointment Scheduled: 03-01-10 Initial call taken by: Lanier Prude, [facility]),  January 17, 2010 4:12 PM  Follow-up for Phone Call        ok 2 ref Follow-up by: Tresa Garter MD,  January 17, 2010 6:05 PM  Additional Follow-up for Phone Call Additional follow up Details #1::        Rx called to pharmacy Additional Follow-up by: Lanier Prude, [facility]),  January 18, 2010 9:25 AM    Prescriptions: HYDROCODONE-ACETAMINOPHEN 7.5-325 MG TABS (HYDROCODONE-ACETAMINOPHEN) 1 by mouth qid as needed pain  #120 x 2   Entered by:   Lanier Prude, [facility])   Authorized by:   Tresa Garter MD   Signed by:   Lanier Prude, CMA(AAMA) on 01/18/2010   Method used:   Telephoned to ...       [facility] [address] (retail)       [address]       [place], [place]  [address]       Ph: [phone] or [phone]       Fax: [phone]   RxID:   [id number]

## 2010-08-02 NOTE — Assessment & Plan Note (Signed)
Summary: BOIL ON LEG/103 FEVER X WEEK END/UPSET STOMACH/BODY ACHES-$50...   Vital Signs:  Patient Profile:   70 Years Old Male Height:     72 inches Weight:      189 pounds Temp:     97.3 degrees F oral Pulse rate:   96 / minute BP sitting:   102 / 66  (left arm)  Vitals Entered By: Tora Perches (February 24, 2008 3:40 PM)                 Chief Complaint:  boil.  History of Present Illness: C/o fever 103 last night. Low grade fever since Fri. Had an irrit hemorroid x 3-4 d - very painfull. C/o diarrhea too. Chills.    Current Allergies: No known allergies   Past Medical History:    Reviewed history from 01/21/2008 and no changes required:       Osteoarthritis       Depression       Melanoma x3 2009 Dr Amy [place]       Basal Cell Carcinoma - right chest       Chronic Insomnia on Ambien x 2 years       ? Restless leg syndrom       History of TIA's       History of dizzy spells       History of tinnitus        Glaucoma       History of alcoholism (dry 13 years)       History of tobacco abuse       L elbow Fx Dr Carola Frost 2009   Family History:    Reviewed history from 12/23/2007 and no changes required:       Father deceased age 30 -Lung cancer       Mother deceased ? 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  Also worked as Research officer, trade union.       Married for 45 years       2 children    Review of Systems  The patient denies fever, chest pain, dyspnea on exertion, and abdominal pain.     Physical Exam  General:     NAD Eyes:     No corneal or conjunctival inflammation noted. EOMI. Perrla. Funduscopic exam benign, without hemorrhages, exudates or papilledema. Vision grossly normal. Nose:     External nasal examination shows no deformity or  inflammation. Nasal mucosa are pink and moist without lesions or exudates. Mouth:     Oral mucosa and oropharynx without lesions or exudates.  Teeth in good repair. Neck:     No deformities, masses, or tenderness noted. Lungs:     Normal respiratory effort, chest expands symmetrically. Lungs are clear to auscultation, no crackles or wheezes. Heart:     Normal rate and regular rhythm. S1 and S2 normal without gallop, murmur, click, rub or other extra sounds. Abdomen:     Bowel sounds positive,abdomen soft and non-tender without masses, organomegaly or hernias noted. Rectal:     6 o'clock fissure and extending to scrotum large ersion. Pea size internal hem, NT, is palpable. No abscess Prostate:  tender and 1+ enlarged.   Msk:     R arm is in a brace   Extremities:     No clubbing, cyanosis, edema, or deformity noted with normal full range of motion of all joints.   Neurologic:     No cranial nerve deficits noted. Station and gait are normal. Plantar reflexes are down-going bilaterally. DTRs are symmetrical throughout. Sensory, motor and coordinative functions appear intact. Skin:     Intact without suspicious lesions or rashes Psych:     Cognition and judgment appear intact. Alert and cooperative with normal attention span and concentration. No apparent delusions, illusions, hallucinations    Impression & Recommendations:  Problem # 1:  FEVER UNSPECIFIED (ICD-780.60) Assessment: New Levaquin 750 mg by mouth once daily starting now (after labs). Poss UTI vs prostatitis. Orders: T-Culture, Blood Routine (04540-98119) TLB-CBC Platelet - w/Differential (85025-CBCD) TLB-BMP (Basic Metabolic Panel-BMET) (80048-METABOL) TLB-Udip ONLY (81003-UDIP) TLB-Hepatic/Liver Function Pnl (80076-HEPATIC) TLB-Udip w/ Micro (81001-URINE)   Problem # 2:  RECTAL FISSURE (ICD-565.0) Assessment: New Triamc. oint Rx Orders: Anoscopy (14782) Anoscopy (95621) Procedure: Anoscopy Indication:  Rectal pain Risks and benefits were explained. The pt. was placed in the R decubitus position. Digital rectal exam revealed a pea size 6 o'clock mass 1 cm above anus. Anoscope was introduced w/o difficulties. Upon withdrawl, a carefull look at the mucosa was obtained. At 6 o'clock a   5     mm anal fissure was present without active bleeding; also 6-7 mm round purple int hem at 6 o'clock. Impression: Anal fissure. 1 internal hemorrhoid. Large erosion. No abscess Disposition: see A&P.  Tolerated well. Complications: none.    Problem # 3:  FRACTURE, ARM, LEFT (ICD-818.0) Assessment: Improved  Problem # 4:  SYNCOPE (ICD-780.2) Assessment: Improved  Complete Medication List: 1)  Travatan 0.004 % Soln (Travoprost) .... One drop op qhs 2)  Vicodin 5-500 Mg Tabs (Hydrocodone-acetaminophen) .... One by mouth qid prn 3)  Vitamin D3 1000 Unit Tabs (Cholecalciferol) .... 4 by mouth daily 4)  Aspirin 81 Mg Tbec (Aspirin) .... One by mouth every day 5)  Multivitamins Tabs (Multiple vitamin) .... Once daily 6)  Levaquin 750 Mg Tabs (Levofloxacin) .[name].. 1 by mouth daily 7)  Triamcinolone Acetonide 0.1 % Oint (Triamcinolone acetonide) .... Use  qid prn   Patient Instructions: 1)  Baby wipes 2)  RTC in 2 d 3)  To ER if T>103 4)  Immodium as needed   Prescriptions: TRIAMCINOLONE ACETONIDE 0.1 % OINT (TRIAMCINOLONE ACETONIDE) use  qid prn  #120 g x 1   Entered and Authorized by:   Tresa Garter MD   Signed by:   Tresa Garter MD on 02/24/2008   Method used:   Print then Give to Patient   RxID:   [id number] LEVAQUIN 750 MG  TABS (LEVOFLOXACIN) 1 by mouth daily  #10 x 0   Entered and Authorized by:   Tresa Garter MD   Signed by:   Tresa Garter MD on 02/24/2008   Method used:   Print then Give to Patient   RxID:   [id number]  ]

## 2010-08-02 NOTE — Progress Notes (Signed)
  Phone Note Call from Patient Call back at Home Phone [phone] Call back at [phone]   Caller: Patient Summary of Call: Patient called [facility] stating that he was to be scheduled for a CT and have not heard status yet. He says that he feels 85% better w/ very little pain and wonders if this is still needed. Please adviser Thanks Initial call taken by: Rock Nephew CMA,  March 02, 2010 9:30 AM  Follow-up for Phone Call        I would still go ahead w/CT Follow-up by: Tresa Garter MD,  March 02, 2010 1:01 PM  Additional Follow-up for Phone Call Additional follow up Details #1::        Pt informed, he would like to go forward w/CT Additional Follow-up by: Lamar Sprinkles, CMA,  March 02, 2010 1:23 PM    Additional Follow-up for Phone Call Additional follow up Details #2::    charity [facility] CT will call the pt to schedule Shelbie Proctor  March 02, 2010 2:25 PM

## 2010-08-02 NOTE — Assessment & Plan Note (Signed)
Summary: 2 mth fu   $50   stc   Vital Signs:  Patient profile:   70 year old male Weight:      185 pounds Temp:     96.8 degrees F oral Pulse rate:   54 / minute BP sitting:   126 / 70  (left arm)  Vitals Entered By: Tora Perches (December 28, 2008 2:02 PM) CC: f/u Is Patient Diabetic? No   CC:  f/u.  History of Present Illness: The patient presents for a follow up of OA, anxiety, TIA, allergies.   Current Medications (verified): 1)  Travatan 0.004 %  Soln (Travoprost) .... One Drop Op Qhs 2)  Vitamin D3 1000 Unit  Tabs (Cholecalciferol) .[name].. 1 By Mouth Daily 3)  Aspirin 81 Mg  Tbec (Aspirin) .... One By Mouth Every Day 4)  Multivitamins  Tabs (Multiple Vitamin) .... Once Daily 5)  Triamcinolone Acetonide 0.1 % Oint (Triamcinolone Acetonide) .... Use  Qid Prn 6)  Miralax   Powd (Polyethylene Glycol 3350) .[name].. 17g By Mouth Once Daily As Needed Constipation 7)  Hydrocodone-Acetaminophen 5-325 Mg Tabs (Hydrocodone-Acetaminophen) .[name].. 1 By Mouth Up To 4 Times Per Day As Needed For Pain 8)  Hydromorphone Hcl 2 Mg Tabs (Hydromorphone Hcl) .... 2 Qhs 9)  Loratadine 10 Mg  Tabs (Loratadine) .... Once Daily As Needed Allergies 10)  Topicort 0.25 % Oint (Desoximetasone) .... Qid On Hands 11)  Triamc 0.5% Cream in Eucerin Lotion 1:10 .... Use Once Daily Prn 12)  Requip 0.5 Mg Tabs (Ropinirole Hcl) .[name].. 1-2 By Mouth At Bedtime As Needed Resless Legs  Allergies: 1)  Levaquin  Past History:  Past Medical History: Last updated: 01/21/2008 Osteoarthritis Depression Melanoma x3 2009 Dr Amy [place] Basal Cell Carcinoma - right chest Chronic Insomnia on Ambien x 2 years ? Restless leg syndrom History of TIA's History of dizzy spells History of tinnitus  Glaucoma History of alcoholism (dry 13 years) History of tobacco abuse L elbow Fx Dr Carola Frost 2009  Past Surgical History: Last updated: 01/21/2008 Status post melanoma excision x 3 2009 Cataract extraction L elbow reconstruction Dr  Carola Frost 2009  Family History: Last updated: 10-25-08 Father deceased age 60 -Lung cancer Mother deceased ? 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  Also worked as Research officer, trade union. Married for 45 years 2 children  Physical Exam  General:  Well-developed,well-nourished,in no acute distress; alert,appropriate and cooperative throughout examination Eyes:  No corneal or conjunctival inflammation noted. EOMI. Perrla. Funduscopic exam benign, without hemorrhages, exudates or papilledema. Vision grossly normal. Nose:  External nasal examination shows no deformity or inflammation. Nasal mucosa are pink and moist without lesions or exudates. Mouth:  Oral mucosa and oropharynx without lesions or exudates.  Teeth in good repair. Neck:  No deformities, masses, or tenderness noted. Lungs:  Normal respiratory effort, chest expands symmetrically. Lungs are clear to auscultation, no crackles or wheezes. Heart:  Normal rate and regular rhythm. S1 and S2 normal without gallop, murmur, click, rub or other extra sounds. Abdomen:  Bowel sounds positive,abdomen soft and non-tender without masses, organomegaly or hernias noted. Msk:  L elbow - tender L 5th digit contracted with a splint on Neurologic:  L hand inteross muscles are tender and fingers 4-5 contractures L forearm w/ less atrophy Skin:  dry Psych:  Cognition and judgment appear intact. Alert and cooperative with  normal attention span and concentration. No apparent delusions, illusions, hallucinations   Impression & Recommendations:  Problem # 1:  DIZZINESS (ICD-780.4) Assessment Improved  His updated medication list for this problem includes:    Loratadine 10 Mg Tabs (Loratadine) ..... Once daily as needed allergies  Problem # 2:  OSTEOARTHRITIS (ICD-715.90) Assessment:  Unchanged  His updated medication list for this problem includes:    Aspirin 81 Mg Tbec (Aspirin) ..... One by mouth every day    Hydrocodone-acetaminophen 5-325 Mg Tabs (Hydrocodone-acetaminophen) .[name]... 1 by mouth up to 4 times per day as needed for pain    Hydromorphone Hcl 2 Mg Tabs (Hydromorphone hcl) .[name]... 2 qhs  Problem # 3:  VITAMIN D DEFICIENCY (ICD-268.9) Assessment: Improved On prescription therapy   Problem # 4:  RESTLESS LEG SYNDROME (ICD-333.94) Assessment: Improved On prescription therapy   Complete Medication List: 1)  Travatan 0.004 % Soln (Travoprost) .... One drop op qhs 2)  Vitamin D3 1000 Unit Tabs (Cholecalciferol) .[name].. 1 by mouth daily 3)  Aspirin 81 Mg Tbec (Aspirin) .... One by mouth every day 4)  Multivitamins Tabs (Multiple vitamin) .... Once daily 5)  Triamcinolone Acetonide 0.1 % Oint (Triamcinolone acetonide) .... Use  qid prn 6)  Miralax Powd (Polyethylene glycol 3350) .[name].. 17g by mouth once daily as needed constipation 7)  Hydrocodone-acetaminophen 5-325 Mg Tabs (Hydrocodone-acetaminophen) .[name].. 1 by mouth up to 4 times per day as needed for pain 8)  Hydromorphone Hcl 2 Mg Tabs (Hydromorphone hcl) .... 2 qhs 9)  Loratadine 10 Mg Tabs (Loratadine) .... Once daily as needed allergies 10)  Topicort 0.25 % Oint (Desoximetasone) .... Qid on hands 11)  Triamc 0.5% Cream in Eucerin Lotion 1:10  .... Use once daily prn 12)  Requip 0.5 Mg Tabs (Ropinirole hcl) .[name].. 1-2 by mouth at bedtime as needed resless legs  Patient Instructions: 1)  Try to eat more raw plant food, dry fruit, raw almonds, leafy vegies, whole foods and less meat, animal fat. Avoid processed foods, (canned soups, hot dogs, sausage , frozen dinners), animal fat, red meat.  2)  Start a chair yoga class  3)  Please schedule a follow-up appointment in 3 months.

## 2010-08-02 NOTE — Letter (Signed)
Summary: humerus fx/Orthopedic Trauma Spec  humerus fx/Orthopedic Trauma Spec   Imported By: Lester Caddo 04/28/2008 10:39:33  _____________________________________________________________________  External Attachment:    Type:   Image     Comment:   External Document

## 2010-08-02 NOTE — Assessment & Plan Note (Signed)
Summary: 3 MO ROV /[facility] $50   Vital Signs:  Patient profile:   70 year old male Height:      72 inches Weight:      187 pounds Temp:     97.3 degrees F oral Pulse rate:   85 / minute BP sitting:   124 / 78  (left arm)  Vitals Entered By: Tora Perches (October 28, 2008 10:46 AM) CC: f/u Is Patient Diabetic? No   CC:  f/u.  History of Present Illness: The patient presents for a follow up of back pain, anxiety, depression. C/o restless legs  Current Medications (verified): 1)  Travatan 0.004 %  Soln (Travoprost) .... One Drop Op Qhs 2)  Vitamin D3 1000 Unit  Tabs (Cholecalciferol) .[name].. 1 By Mouth Daily 3)  Aspirin 81 Mg  Tbec (Aspirin) .... One By Mouth Every Day 4)  Multivitamins  Tabs (Multiple Vitamin) .... Once Daily 5)  Triamcinolone Acetonide 0.1 % Oint (Triamcinolone Acetonide) .... Use  Qid Prn 6)  Miralax   Powd (Polyethylene Glycol 3350) .[name].. 17g By Mouth Once Daily As Needed Constipation 7)  Hydrocodone-Acetaminophen 5-325 Mg Tabs (Hydrocodone-Acetaminophen) .[name].. 1 By Mouth Up To 4 Times Per Day As Needed For Pain 8)  Hydromorphone Hcl 2 Mg Tabs (Hydromorphone Hcl) .... 2 Qhs 9)  Methocarbamol 500 Mg Tabs (Methocarbamol) .... 2qhs For Muscle Spasms 10)  Loratadine 10 Mg  Tabs (Loratadine) .... Once Daily As Needed Allergies 11)  Topicort 0.25 % Oint (Desoximetasone) .... Qid On Hands  Allergies: 1)  Levaquin  Past History:  Past Medical History:    Osteoarthritis    Depression    Melanoma x3 2009 Dr Amy [place]    Basal Cell Carcinoma - right chest    Chronic Insomnia on Ambien x 2 years    ? Restless leg syndrom    History of TIA's    History of dizzy spells    History of tinnitus     Glaucoma    History of alcoholism (dry 13 years)    History of tobacco abuse    L elbow Fx Dr Carola Frost 2009 (01/21/2008)  Past Surgical History:    Status post melanoma excision x 3 2009    Cataract extraction    L elbow reconstruction Dr Carola Frost 2009      (01/21/2008)  Family History:    Father deceased age 39 -Lung cancer    Mother deceased ? 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  Also worked as Research officer, trade union.    Married for 45 years    2 children (12/23/2007)  Physical Exam  General:  Well-developed,well-nourished,in no acute distress; alert,appropriate and cooperative throughout examination Nose:  External nasal examination shows no deformity or inflammation. Nasal mucosa are pink and moist without lesions or exudates. Mouth:  Oral mucosa and oropharynx without lesions or exudates.  Teeth in good repair. Neck:  No deformities, masses, or tenderness noted. Lungs:  Normal respiratory effort, chest expands symmetrically. Lungs are clear to auscultation, no crackles or wheezes. Heart:  Normal rate and regular rhythm. S1 and S2 normal without gallop, murmur, click, rub or other extra sounds. Abdomen:  Bowel sounds positive,abdomen soft and non-tender without masses, organomegaly or hernias noted. Msk:  L elbow - tender L 5th  digit contracted Neurologic:  L hand inteross muscles are tender and fingers 4-5 contractures L forearm w/atrophy Skin:  dry Psych:  Cognition and judgment appear intact. Alert and cooperative with normal attention span and concentration. No apparent delusions, illusions, hallucinations   Impression & Recommendations:  Problem # 1:  RESTLESS LEG SYNDROME (ICD-333.94) Assessment Deteriorated  Try Requip  Orders: Prescription Created Electronically [phone])  Problem # 2:  EDEMA (ICD-782.3) Assessment: Improved  Problem # 3:  SKIN RASH, ALLERGIC (ICD-692.9) Assessment: Improved  His updated medication list for this problem includes:    Triamcinolone Acetonide 0.1 % Oint (Triamcinolone acetonide) ..... Use  qid prn    Loratadine 10 Mg Tabs  (Loratadine) ..... Once daily as needed allergies    Topicort 0.25 % Oint (Desoximetasone) ..... Qid on hands  Problem # 4:  NEUROPATHY, OTHER INFLAMMATORY AND TOXIC (ICD-357.89) Assessment: Comment Only  Problem # 5:  FRACTURE, ARM, LEFT (ICD-818.0) Assessment: Comment Only L 5th digit will need a redo surgery  Problem # 6:  DEPRESSION (ICD-311) Assessment: Unchanged  Not on prescription therapy   Orders: Prescription Created Electronically [phone])  Complete Medication List: 1)  Travatan 0.004 % Soln (Travoprost) .... One drop op qhs 2)  Vitamin D3 1000 Unit Tabs (Cholecalciferol) .[name].. 1 by mouth daily 3)  Aspirin 81 Mg Tbec (Aspirin) .... One by mouth every day 4)  Multivitamins Tabs (Multiple vitamin) .... Once daily 5)  Triamcinolone Acetonide 0.1 % Oint (Triamcinolone acetonide) .... Use  qid prn 6)  Miralax Powd (Polyethylene glycol 3350) .[name].. 17g by mouth once daily as needed constipation 7)  Hydrocodone-acetaminophen 5-325 Mg Tabs (Hydrocodone-acetaminophen) .[name].. 1 by mouth up to 4 times per day as needed for pain 8)  Hydromorphone Hcl 2 Mg Tabs (Hydromorphone hcl) .... 2 qhs 9)  Loratadine 10 Mg Tabs (Loratadine) .... Once daily as needed allergies 10)  Topicort 0.25 % Oint (Desoximetasone) .... Qid on hands 11)  Triamc 0.5% Cream in Eucerin Lotion 1:10  .... Use once daily prn 12)  Requip 0.5 Mg Tabs (Ropinirole hcl) .[name].. 1-2 by mouth at bedtime as needed resless legs  Patient Instructions: 1)  Please schedule a follow-up appointment in 2 months. Prescriptions: HYDROCODONE-ACETAMINOPHEN 5-325 MG TABS (HYDROCODONE-ACETAMINOPHEN) 1 by mouth up to 4 times per day as needed for pain  #120 x 2   Entered and Authorized by:   Tresa Garter MD   Signed by:   Tresa Garter MD on 10/28/2008   Method used:   Print then Give to Patient   RxID:   [id number] REQUIP 0.5 MG TABS (ROPINIROLE HCL) 1-2 by mouth at bedtime as needed resless legs  #60 x 6   Entered and  Authorized by:   Tresa Garter MD   Signed by:   Tresa Garter MD on 10/28/2008   Method used:   Electronically to        [facility] [phone]* (retail)       [address]       [place], [place]  [address]       Ph: [phone] or [phone]       Fax: [phone]   RxID:   [id number] TRIAMC 0.5% CREAM IN EUCERIN LOTION 1:10 use once daily prn  #500 g x 3   Entered and Authorized by:   Tresa Garter MD   Signed by:   Tresa Garter MD on 10/28/2008   Method used:   Print then Give to Patient  RxID:   [id number]

## 2010-08-02 NOTE — Progress Notes (Signed)
  Phone Note Other Incoming   Summary of Call: Still coughing Initial call taken by: Tresa Garter MD,  April 25, 2010 9:58 AM  Follow-up for Phone Call        [facility] Follow-up by: Tresa Garter MD,  April 25, 2010 9:58 AM    New/Updated Medications: ZITHROMAX Z-PAK 250 MG TABS (AZITHROMYCIN) as dirrected Prescriptions: ZITHROMAX Z-PAK 250 MG TABS (AZITHROMYCIN) as dirrected  #1 x 0   Entered and Authorized by:   Tresa Garter MD   Signed by:   Tresa Garter MD on 04/25/2010   Method used:   Electronically to        [facility] [phone]* (retail)       [address]       [place], [place]  [address]       Ph: [phone] or [phone]       Fax: [phone]   RxID:   [id number]

## 2010-08-02 NOTE — Assessment & Plan Note (Signed)
Summary: 2 DAY RETURN PER DR PLOT/CD   Vital Signs:  Patient Profile:   70 Years Old Male Height:     72 inches Weight:      188 pounds Temp:     97.1 degrees F oral Pulse rate:   80 / minute BP sitting:   124 / 70  (left arm)  Vitals Entered By: Tora Perches (February 26, 2008 10:48 AM)                 Chief Complaint:  Multiple medical problems or concerns.  History of Present Illness: The patient presents for a follow up of fever. Much better!     Current Allergies: No known allergies   Past Medical History:    Reviewed history from 01/21/2008 and no changes required:       Osteoarthritis       Depression       Melanoma x3 2009 Dr Amy [place]       Basal Cell Carcinoma - right chest       Chronic Insomnia on Ambien x 2 years       ? Restless leg syndrom       History of TIA's       History of dizzy spells       History of tinnitus        Glaucoma       History of alcoholism (dry 13 years)       History of tobacco abuse       L elbow Fx Dr Carola Frost 2009     Review of Systems  The patient denies fever, chest pain, and dyspnea on exertion.         No chills   Physical Exam  General:     Well-developed,well-nourished,in no acute distress; alert,appropriate and cooperative throughout examination Mouth:     Oral mucosa and oropharynx without lesions or exudates.  Teeth in good repair. Lungs:     Normal respiratory effort, chest expands symmetrically. Lungs are clear to auscultation, no crackles or wheezes. Heart:     Normal rate and regular rhythm. S1 and S2 normal without gallop, murmur, click, rub or other extra sounds. Abdomen:     Bowel sounds positive,abdomen soft and non-tender without masses, organomegaly or hernias noted. Msk:     R hand w/o swelling, cast is on Extremities:     No clubbing, cyanosis, edema, or deformity noted with normal full range of motion of all joints.   Neurologic:     No cranial nerve deficits noted. Station and gait  are normal. Plantar reflexes are down-going bilaterally. DTRs are symmetrical throughout. Sensory, motor and coordinative functions appear intact.    Impression & Recommendations:  Problem # 1:  UTI (ICD-599.0) Assessment: Improved  His updated medication list for this problem includes:    Levaquin 750 Mg Tabs (Levofloxacin) .[name]... 1 by mouth daily The labs were reviewd with the patient.   Problem # 2:  FEVER UNSPECIFIED (ICD-780.60) Assessment: Improved  Problem # 3:  RECTAL FISSURE (ICD-565.0) Assessment: Improved Cont Rx  Complete Medication List: 1)  Travatan 0.004 % Soln (Travoprost) .... One drop op qhs 2)  Vicodin 5-500 Mg Tabs (Hydrocodone-acetaminophen) .... One by mouth qid prn 3)  Vitamin D3 1000 Unit Tabs (Cholecalciferol) .... 4 by mouth daily 4)  Aspirin 81 Mg Tbec (Aspirin) .... One by mouth every day 5)  Multivitamins Tabs (Multiple vitamin) .... Once daily 6)  Levaquin 750 Mg Tabs (Levofloxacin) .Marland KitchenMarland KitchenMarland Kitchen 1  by mouth daily 7)  Triamcinolone Acetonide 0.1 % Oint (Triamcinolone acetonide) .... Use  qid prn   Patient Instructions: 1)  Please schedule a follow-up appointment in 2 months with [facility] 599.0.   ]

## 2010-08-02 NOTE — Assessment & Plan Note (Signed)
Summary: 3 mo fu/$50/pn   Vital Signs:  Patient Profile:   70 Years Old Male Height:     72 inches Weight:      188 pounds Temp:     96.9 degrees F oral Pulse rate:   84 / minute BP sitting:   114 / 70  (left arm)  Vitals Entered By: Tora Perches (July 29, 2008 10:50 AM)                 Chief Complaint:  Multiple medical problems or concerns.  History of Present Illness: The patient presents for a follow up of back pain, anxiety, depression and L hand atrophy     Prior Medications Reviewed Using: Patient Recall  Prior Medication List:  TRAVATAN 0.004 %  SOLN (TRAVOPROST) one drop OP qhs VITAMIN D3 1000 UNIT  TABS (CHOLECALCIFEROL) 1 by mouth daily ASPIRIN 81 MG  TBEC (ASPIRIN) one by mouth every day MULTIVITAMINS  TABS (MULTIPLE VITAMIN) once daily TRIAMCINOLONE ACETONIDE 0.1 % OINT (TRIAMCINOLONE ACETONIDE) use  qid prn GABAPENTIN 100 MG CAPS (GABAPENTIN) 1 by mouth qid as needed pain MIRALAX   POWD (POLYETHYLENE GLYCOL 3350) 17g by mouth once daily as needed constipation HYDROCODONE-ACETAMINOPHEN 5-325 MG TABS (HYDROCODONE-ACETAMINOPHEN) 1 by mouth up to 4 times per day as needed for pain   Current Allergies (reviewed today): LEVAQUIN  Past Medical History:    Reviewed history from 01/21/2008 and no changes required:       Osteoarthritis       Depression       Melanoma x3 2009 Dr Amy [place]       Basal Cell Carcinoma - right chest       Chronic Insomnia on Ambien x 2 years       ? Restless leg syndrom       History of TIA's       History of dizzy spells       History of tinnitus        Glaucoma       History of alcoholism (dry 13 years)       History of tobacco abuse       L elbow Fx Dr Carola Frost 2009   Family History:    Reviewed history from 12/23/2007 and no changes required:       Father deceased age 16 -Lung cancer       Mother deceased ? 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  Also worked as Research officer, trade union.       Married for 45 years       2 children    Review of Systems  The patient denies fever, dyspnea on exertion, and abdominal pain.     Physical Exam  General:     Well-developed,well-nourished,in no acute distress; alert,appropriate and cooperative throughout examination Eyes:     No corneal or conjunctival inflammation noted. EOMI. Perrla. Funduscopic exam benign, without hemorrhages, exudates or papilledema. Vision grossly normal. Nose:     External nasal examination shows no deformity or inflammation. Nasal mucosa are pink and moist without lesions or exudates. Mouth:     Oral mucosa and oropharynx without lesions or  exudates.  Teeth in good repair. Neck:     No deformities, masses, or tenderness noted. Lungs:     Normal respiratory effort, chest expands symmetrically. Lungs are clear to auscultation, no crackles or wheezes. Heart:     Normal rate and regular rhythm. S1 and S2 normal without gallop, murmur, click, rub or other extra sounds. Abdomen:     Bowel sounds positive,abdomen soft and non-tender without masses, organomegaly or hernias noted. Msk:     No deformity or scoliosis noted of thoracic or lumbar spine.   Neurologic:     L hand inteross muscles are tender and fingers 4-5 contractures L forearm w/atrophy Skin:     Intact without suspicious lesions or rashes Psych:     Cognition and judgment appear intact. Alert and cooperative with normal attention span and concentration. No apparent delusions, illusions, hallucinations    Impression & Recommendations:  Problem # 1:  FRACTURE, ARM, LEFT (ICD-818.0) Assessment: Improved Dr Amanda Pea f/u is pending   Problem # 2:  VITAMIN D DEFICIENCY (ICD-268.9) Assessment: Comment Only On prescription therapy   Problem # 3:   OSTEOARTHRITIS (ICD-715.90) Assessment: Comment Only  His updated medication list for this problem includes:    Aspirin 81 Mg Tbec (Aspirin) ..... One by mouth every day    Hydrocodone-acetaminophen 5-325 Mg Tabs (Hydrocodone-acetaminophen) .[name]... 1 by mouth up to 4 times per day as needed for pain   Problem # 4:  NEUROPATHY, OTHER INFLAMMATORY AND TOXIC (ICD-357.89) Assessment: Comment Only On prescription therapy   Problem # 5:  DIZZINESS (ICD-780.4) Assessment: Improved  Complete Medication List: 1)  Travatan 0.004 % Soln (Travoprost) .... One drop op qhs 2)  Vitamin D3 1000 Unit Tabs (Cholecalciferol) .[name].. 1 by mouth daily 3)  Aspirin 81 Mg Tbec (Aspirin) .... One by mouth every day 4)  Multivitamins Tabs (Multiple vitamin) .... Once daily 5)  Triamcinolone Acetonide 0.1 % Oint (Triamcinolone acetonide) .... Use  qid prn 6)  Gabapentin 100 Mg Caps (Gabapentin) .[name].. 1 by mouth qid as needed pain 7)  Miralax Powd (Polyethylene glycol 3350) .[name].. 17g by mouth once daily as needed constipation 8)  Hydrocodone-acetaminophen 5-325 Mg Tabs (Hydrocodone-acetaminophen) .[name].. 1 by mouth up to 4 times per day as needed for pain   Patient Instructions: 1)  Please schedule a follow-up appointment in 3 months. 2)  BMP prior to visit, ICD-9: 3)  TSH prior to visit, ICD-9: 4)  Vit D, Vit B12  780.2  995.29 729.5   Prescriptions: HYDROCODONE-ACETAMINOPHEN 5-325 MG TABS (HYDROCODONE-ACETAMINOPHEN) 1 by mouth up to 4 times per day as needed for pain  #120 x 2   Entered and Authorized by:   Tresa Garter MD   Signed by:   Tresa Garter MD on 07/29/2008   Method used:   Print then Give to Patient   RxID:   [id number]

## 2010-08-02 NOTE — Progress Notes (Signed)
Summary: refill  Phone Note Refill Request Call back at Home Phone [phone] Message from:  Patient on April 19, 2009 1:32 PM  Refills Requested: Medication #1:  HYDROCODONE-ACETAMINOPHEN 5-325 MG TABS 1 by mouth up to 4 times per day as needed for pain [facility]  Initial call taken by: Rock Nephew CMA,  April 19, 2009 1:32 PM    Prescriptions: HYDROCODONE-ACETAMINOPHEN 5-325 MG TABS (HYDROCODONE-ACETAMINOPHEN) 1 by mouth up to 4 times per day as needed for pain  #120 x 3   Entered by:   Tora Perches   Authorized by:   Tresa Garter MD   Signed by:   Tora Perches on 04/20/2009   Method used:   Telephoned to ...       [facility] [address] (retail)       [address]       [place], [place]  [address]       Ph: [phone] or [phone]       Fax: [phone]   RxID:   [id number]

## 2010-08-02 NOTE — Progress Notes (Signed)
Summary: Rf hydroco/acetam  Phone Note Refill Request Message from:  Fax from Pharmacy  Refills Requested: Medication #1:  HYDROCODONE-ACETAMINOPHEN 7.5-325 MG TABS 1 by mouth qid as needed pain   Dosage confirmed as above?Dosage Confirmed   Supply Requested: 120   Last Refilled: 04/18/2010  Method Requested: Telephone to Pharmacy Initial call taken by: Lanier Prude, [facility]),  May 19, 2010 10:59 AM  Follow-up for Phone Call        ok 3 ref Follow-up by: Tresa Garter MD,  May 19, 2010 1:04 PM  Additional Follow-up for Phone Call Additional follow up Details #1::        Rx called to pharmacy Additional Follow-up by: Lanier Prude, [facility]),  May 19, 2010 2:28 PM    Prescriptions: HYDROCODONE-ACETAMINOPHEN 7.5-325 MG TABS (HYDROCODONE-ACETAMINOPHEN) 1 by mouth qid as needed pain  #120 x 2   Entered by:   Lanier Prude, [facility])   Authorized by:   Tresa Garter MD   Signed by:   Lanier Prude, CMA(AAMA) on 05/19/2010   Method used:   Telephoned to ...       [facility] [address] (retail)       [address]       [place], [place]  [address]       Ph: [phone] or [phone]       Fax: [phone]   RxID:   [id number]

## 2010-08-02 NOTE — Assessment & Plan Note (Signed)
Summary: work in per phone note/left hand,arm swelling/cd   Vital Signs:  Patient profile:   70 year old male Height:      72 inches Weight:      190 pounds BMI:     25.86 Temp:     97 degrees F oral Pulse rate:   62 / minute BP sitting:   118 / 62  (left arm)  Vitals Entered By: Tora Perches (October 01, 2008 9:14 AM)  History of Present Illness: C/o L hand and arm swelling x 3-4 d, no pain - itchy. Saw Dr Shon Baton on Tue - said brace was too tight. Now other hand is swollen too. Noticed rash on B hands/ distal forearmes  Current Medications (verified): 1)  Travatan 0.004 %  Soln (Travoprost) .... One Drop Op Qhs 2)  Vitamin D3 1000 Unit  Tabs (Cholecalciferol) .[name].. 1 By Mouth Daily 3)  Aspirin 81 Mg  Tbec (Aspirin) .... One By Mouth Every Day 4)  Multivitamins  Tabs (Multiple Vitamin) .... Once Daily 5)  Triamcinolone Acetonide 0.1 % Oint (Triamcinolone Acetonide) .... Use  Qid Prn 6)  Gabapentin 100 Mg Caps (Gabapentin) .[name].. 1 By Mouth Qid As Needed Pain 7)  Miralax   Powd (Polyethylene Glycol 3350) .[name].. 17g By Mouth Once Daily As Needed Constipation 8)  Hydrocodone-Acetaminophen 5-325 Mg Tabs (Hydrocodone-Acetaminophen) .[name].. 1 By Mouth Up To 4 Times Per Day As Needed For Pain 9)  Hydromorphone Hcl 2 Mg Tabs (Hydromorphone Hcl) .[name].. 1-2 Q4-6hrs As Needed 10)  Methocarbamol 500 Mg Tabs (Methocarbamol) .[name].. 1 Q 6-8hrs For Muscle Spasms  Allergies: 1)  Levaquin  Past History:  Past Medical History:    Osteoarthritis    Depression    Melanoma x3 2009 Dr Amy [place]    Basal Cell Carcinoma - right chest    Chronic Insomnia on Ambien x 2 years    ? Restless leg syndrom    History of TIA's    History of dizzy spells    History of tinnitus     Glaucoma    History of alcoholism (dry 13 years)    History of tobacco abuse    L elbow Fx Dr Carola Frost 2009 (01/21/2008)  Past Surgical History:    Status post melanoma excision x 3 2009    Cataract extraction    L elbow reconstruction  Dr Carola Frost 2009     (01/21/2008)  Family History:    Father deceased age 33 -Lung cancer    Mother deceased ? 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  Also worked as Research officer, trade union.    Married for 45 years    2 children (Dec 31, 2007)  Risk Factors:    Alcohol Use: N/A    >5 drinks/d w/in last 3 months: N/A    Caffeine Use: N/A    Diet: N/A    Exercise: N/A  Risk Factors:    Smoking Status: quit (01/21/2008)    Packs/Day: N/A    Cigars/wk: N/A    Pipe Use/wk: N/A    Cans of tobacco/wk: N/A    Passive Smoke Exposure: N/A  Family History:    Reviewed history from December 31, 2007 and no changes required:       Father deceased age 42 -Lung cancer  Mother deceased ? 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  Also worked as Research officer, trade union.       Married for 45 years       2 children  Physical Exam  General:  Well-developed,well-nourished,in no acute distress; alert,appropriate and cooperative throughout examination Mouth:  Oral mucosa and oropharynx without lesions or exudates.  Teeth in good repair. Neck:  No deformities, masses, or tenderness noted. Lungs:  Normal respiratory effort, chest expands symmetrically. Lungs are clear to auscultation, no crackles or wheezes. Heart:  Normal rate and regular rhythm. S1 and S2 normal without gallop, murmur, click, rub or other extra sounds. Msk:  L elbow - tender Skin:  L hand w/1+ edema; R hand - trace Dry erythem. papular rash on B hands and distal 2/3's of his forearms Psych:  Cognition and judgment appear intact. Alert and cooperative with normal attention span  and concentration. No apparent delusions, illusions, hallucinations   Impression & Recommendations:  Problem # 1:  SKIN RASH, ALLERGIC (ICD-692.9) B hands and forearms - contact vs solar dermititis Assessment New  His updated medication list for this problem includes:    Triamcinolone Acetonide 0.1 % Oint (Triamcinolone acetonide) ..... Use  qid prn    Loratadine 10 Mg Tabs (Loratadine) ..... Once daily as needed allergies    Topicort 0.25 % Oint (Desoximetasone) ..... Qid on hands  Problem # 2:  EDEMA (ICD-782.3) UE (hands) L>>R Assessment: New Treat #1, elevate forearms  Problem # 3:  ARM PAIN, LEFT (ICD-729.5) Assessment: Improved  Complete Medication List: 1)  Travatan 0.004 % Soln (Travoprost) .... One drop op qhs 2)  Vitamin D3 1000 Unit Tabs (Cholecalciferol) .[name].. 1 by mouth daily 3)  Aspirin 81 Mg Tbec (Aspirin) .... One by mouth every day 4)  Multivitamins Tabs (Multiple vitamin) .... Once daily 5)  Triamcinolone Acetonide 0.1 % Oint (Triamcinolone acetonide) .... Use  qid prn 6)  Gabapentin 100 Mg Caps (Gabapentin) .[name].. 1 by mouth qid as needed pain 7)  Miralax Powd (Polyethylene glycol 3350) .[name].. 17g by mouth once daily as needed constipation 8)  Hydrocodone-acetaminophen 5-325 Mg Tabs (Hydrocodone-acetaminophen) .[name].. 1 by mouth up to 4 times per day as needed for pain 9)  Hydromorphone Hcl 2 Mg Tabs (Hydromorphone hcl) .[name].. 1-2 q4-6hrs as needed 10)  Methocarbamol 500 Mg Tabs (Methocarbamol) .[name].. 1 q 6-8hrs for muscle spasms 11)  Loratadine 10 Mg Tabs (Loratadine) .... Once daily as needed allergies 12)  Topicort 0.25 % Oint (Desoximetasone) .... Qid on hands  Patient Instructions: 1)  Elevate hands, arms on a pillow, especially Left 2)  Please schedule a follow-up appointment in 2 weeks. Prescriptions: TOPICORT 0.25 % OINT (DESOXIMETASONE) qid on hands  #45 g x 2   Entered and Authorized by:   Tresa Garter MD   Signed by:   Tresa Garter MD on  10/01/2008   Method used:   Electronically to        [phone] (retail)       [address]       [place], [place]  [address]       Ph: [phone] or  [phone]       Fax: [phone]   RxID:   [id number] LORATADINE 10 MG  TABS (LORATADINE) once daily as needed allergies  #30 x 12   Entered and Authorized by:   Tresa Garter MD   Signed by:   Tresa Garter MD on 10/01/2008   Method used:   Electronically to        [phone] (retail)       [address]       [place], [place]  [address]       Ph: [phone] or [phone]       Fax: [phone]   RxID:   [id number]

## 2010-08-02 NOTE — Progress Notes (Signed)
Summary: Arm Swelling  Phone Note Call from Patient   Summary of Call: Pt had surgery on left arm 3 wks ago to move his ulnar nerve. C/o left hand and arm swelling. He went to ortho and they said that it was not infected and he was wearing his brace too tight. Last night he noticed his right hand and arm also started to swell. Now he thinks that it is not related to surgery b/c both have swelling. He was told to take benadryl which has not helped. NO apts avail tomorrow, what do you suggest? Initial call taken by: Lamar Sprinkles,  September 30, 2008 3:54 PM  Follow-up for Phone Call        Work in [employer]. Keep arms elevated Follow-up by: Tresa Garter MD,  September 30, 2008 5:08 PM  Additional Follow-up for Phone Call Additional follow up Details #1::        Please schedule apt for pt for thursday Additional Follow-up by: Lamar Sprinkles,  September 30, 2008 6:05 PM    Additional Follow-up for Phone Call Additional follow up Details #2::    4/1 appt at 9:15 a.m. Follow-up by: Verdell Face,  October 01, 2008 8:06 AM

## 2010-08-02 NOTE — Assessment & Plan Note (Signed)
Summary: 1-2 WK ROV /NWS   Vital Signs:  Patient profile:   70 year old male Height:      72 inches Weight:      193 pounds BMI:     23.55 Temp:     97.3 degrees F oral Pulse rate:   76 / minute Pulse rhythm:   regular Resp:     16 per minute BP sitting:   112 / 78  (left arm) Cuff size:   regular  Vitals Entered By: Lanier Prude, Beverly Gust) (March 24, 2010 10:17 AM) CC: f/u Is Patient Diabetic? No   CC:  f/u.  History of Present Illness: The patient presents for a follow up of hypertension, hand OA, hyperlipidemia The patient presents with complaints of sore throat, fever, cough, sinus congestion and drainge of several days duration. Not better with OTC meds. Chest hurts with coughing. The mucus is colored.   Current Medications (verified): 1)  Travatan 0.004 %  Soln (Travoprost) .... One Drop Op Qhs 2)  Vitamin D3 1000 Unit  Tabs (Cholecalciferol) .[name].. 1 By Mouth Daily 3)  Aspirin 81 Mg  Tbec (Aspirin) .... One By Mouth Every Day 4)  Multivitamins  Tabs (Multiple Vitamin) .... Once Daily 5)  Triamcinolone Acetonide 0.1 % Oint (Triamcinolone Acetonide) .... Use  Qid Prn 6)  Miralax   Powd (Polyethylene Glycol 3350) .[name].. 17g By Mouth Once Daily As Needed Constipation 7)  Loratadine 10 Mg  Tabs (Loratadine) .... Once Daily As Needed Allergies 8)  Topicort 0.25 % Oint (Desoximetasone) .... Qid On Hands 9)  Triamc 0.5% Cream in Eucerin Lotion 1:10 .... Use Once Daily Prn 10)  Hydrocodone-Acetaminophen 7.5-325 Mg Tabs (Hydrocodone-Acetaminophen) .[name].. 1 By Mouth Qid As Needed Pain 11)  Requip 1 Mg Tabs (Ropinirole Hcl) .[name].. 1-2 By Mouth At Bedtime As Needed Restless Legs 12)  Cialis 20 Mg Tabs (Tadalafil) .... 1/2 or 1 By Mouth Q 1-3 D Prn  Allergies (verified): 1)  Levaquin  Past History:  Past Medical History: Last updated: 07/27/2009 Osteoarthritis Depression Melanoma x3 2009 Dr Amy [place] Basal Cell Carcinoma - right chest Chronic Insomnia on Ambien x 2  years ? 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  Also worked as Research officer, trade union. Married for 45 years 2 children  Review of Systems  The patient denies fever, weight loss, weight gain, and abdominal pain.    Physical Exam  General:  Well-developed,well-nourished,in no acute distress; alert,appropriate and cooperative throughout examination Nose:  External nasal examination shows no deformity or inflammation. Nasal mucosa are pink and moist without lesions or exudates. Mouth:  Moist Neck:  No deformities, masses, or tenderness noted. Lungs:  Normal respiratory effort, chest expands symmetrically. Lungs are clear to auscultation, no crackles or wheezes. Heart:  Normal rate and regular rhythm. S1 and S2 normal without gallop, murmur, click, rub or other extra sounds. Abdomen:  S/NT Msk:  chest NT B B hands with OA L with post-op deformity Extremities:  No edema B Neurologic:  alert & oriented X3.   Heel to toe almost OK Skin:  Intact without suspicious lesions or rashes Psych:  Cognition and judgment appear intact.  Alert and cooperative with normal attention span and concentration. No apparent delusions, illusions, hallucinations   Impression & Recommendations:  Problem # 1:  CHEST PAIN (ICD-786.50) Assessment Comment Only Resolved - MSK We reviewed with pt his CT scan of the chest  Problem # 2:  SINUSITIS, ACUTE (ICD-461.9) Assessment: New  His updated medication list for this problem includes:    Amoxicillin 500 Mg Caps (Amoxicillin) .[name]... 2 caps by mouth bid  Problem # 3:  LOW BACK PAIN  (ICD-724.2) Assessment: Unchanged  His updated medication list for this problem includes:    Aspirin 81 Mg Tbec (Aspirin) ..... One by mouth every day    Hydrocodone-acetaminophen 7.5-325 Mg Tabs (Hydrocodone-acetaminophen) .[name]... 1 by mouth qid as needed pain  Problem # 4:  OSTEOARTHRITIS (ICD-715.90) Assessment: Unchanged  His updated medication list for this problem includes:    Aspirin 81 Mg Tbec (Aspirin) ..... One by mouth every day    Hydrocodone-acetaminophen 7.5-325 Mg Tabs (Hydrocodone-acetaminophen) .[name]... 1 by mouth qid as needed pain  Problem # 5:  DEPRESSION (ICD-311) Assessment: Improved  Complete Medication List: 1)  Travatan 0.004 % Soln (Travoprost) .... One drop op qhs 2)  Vitamin D3 1000 Unit Tabs (Cholecalciferol) .[name].. 1 by mouth daily 3)  Aspirin 81 Mg Tbec (Aspirin) .... One by mouth every day 4)  Multivitamins Tabs (Multiple vitamin) .... Once daily 5)  Triamcinolone Acetonide 0.1 % Oint (Triamcinolone acetonide) .... Use  qid prn 6)  Miralax Powd (Polyethylene glycol 3350) .[name].. 17g by mouth once daily as needed constipation 7)  Loratadine 10 Mg Tabs (Loratadine) .... Once daily as needed allergies 8)  Topicort 0.25 % Oint (Desoximetasone) .... Qid on hands 9)  Triamc 0.5% Cream in Eucerin Lotion 1:10  .... Use once daily prn 10)  Hydrocodone-acetaminophen 7.5-325 Mg Tabs (Hydrocodone-acetaminophen) .[name].. 1 by mouth qid as needed pain 11)  Requip 1 Mg Tabs (Ropinirole hcl) .[name].. 1-2 by mouth at bedtime as needed restless legs 12)  Cialis 20 Mg Tabs (Tadalafil) .... 1/2 or 1 by mouth q 1-3 d prn 13)  Amoxicillin 500 Mg Caps (Amoxicillin) .... 2 caps by mouth bid  Patient Instructions: 1)  Please schedule a follow-up appointment in 3 months well w/labs. Prescriptions: LORATADINE 10 MG  TABS (LORATADINE) once daily as needed allergies  #30 x 12   Entered and Authorized by:   Tresa Garter MD   Signed by:   Tresa Garter MD on 03/24/2010   Method  used:   Print then Give to Patient   RxID:   [id number] HYDROCODONE-ACETAMINOPHEN 7.5-325 MG TABS (HYDROCODONE-ACETAMINOPHEN) 1 by mouth qid as needed pain  #120 x 2   Entered and Authorized by:   Tresa Garter MD   Signed by:   Tresa Garter MD on 03/24/2010   Method used:   Print then Give to Patient   RxID:   [id number] AMOXICILLIN 500 MG CAPS (AMOXICILLIN) 2 caps by mouth bid  #40 x 0   Entered and Authorized by:   Tresa Garter MD   Signed by:   Tresa Garter MD on 03/24/2010   Method used:   Print then Give to Patient   RxID:   [id number]

## 2010-08-02 NOTE — Assessment & Plan Note (Signed)
Summary: 4 MO ROV /NWS #   Vital Signs:  Patient profile:   70 year old male Weight:      192 pounds BMI:     26.13 Temp:     97.4 degrees F oral Pulse rate:   61 / minute BP sitting:   126 / 70  (left arm)  Vitals Entered By: Tora Perches (July 27, 2009 10:02 AM) CC: f/u Is Patient Diabetic? No   CC:  f/u.  History of Present Illness: The patient presents for a follow up ofOA, back pain, restless legs. C/o ED.   Preventive Screening-Counseling & Management  Alcohol-Tobacco     Smoking Status: quit  Current Medications (verified): 1)  Travatan 0.004 %  Soln (Travoprost) .... One Drop Op Qhs 2)  Vitamin D3 1000 Unit  Tabs (Cholecalciferol) .[name].. 1 By Mouth Daily 3)  Aspirin 81 Mg  Tbec (Aspirin) .... One By Mouth Every Day 4)  Multivitamins  Tabs (Multiple Vitamin) .... Once Daily 5)  Triamcinolone Acetonide 0.1 % Oint (Triamcinolone Acetonide) .... Use  Qid Prn 6)  Miralax   Powd (Polyethylene Glycol 3350) .[name].. 17g By Mouth Once Daily As Needed Constipation 7)  Hydrocodone-Acetaminophen 5-325 Mg Tabs (Hydrocodone-Acetaminophen) .[name].. 1 By Mouth Up To 4 Times Per Day As Needed For Pain 8)  Hydromorphone Hcl 2 Mg Tabs (Hydromorphone Hcl) .... 2 Qhs 9)  Loratadine 10 Mg  Tabs (Loratadine) .... Once Daily As Needed Allergies 10)  Topicort 0.25 % Oint (Desoximetasone) .... Qid On Hands 11)  Triamc 0.5% Cream in Eucerin Lotion 1:10 .... Use Once Daily Prn 12)  Requip 0.5 Mg Tabs (Ropinirole Hcl) .[name].. 1-2 By Mouth At Bedtime As Needed Resless Legs  Allergies: 1)  Levaquin  Past History:  Past Medical History: Osteoarthritis Depression Melanoma x3 2009 Dr Amy [place] Basal Cell Carcinoma - right chest Chronic Insomnia on Ambien x 2 years ? Restless leg syndrom History of TIA's History of dizzy spells History of tinnitus  Glaucoma History of alcoholism (dry 13 years) History of tobacco abuse L elbow Fx Dr Carola Frost 2009 Diverticulosis, colon Low back pain  Physical  Exam  General:  Well-developed,well-nourished,in no acute distress; alert,appropriate and cooperative throughout examination Nose:  External nasal examination shows no deformity or inflammation. Nasal mucosa are pink and moist without lesions or exudates. Mouth:  Moist Lungs:  Normal respiratory effort, chest expands symmetrically. Lungs are clear to auscultation, no crackles or wheezes. Heart:  Normal rate and regular rhythm. S1 and S2 normal without gallop, murmur, click, rub or other extra sounds. Abdomen:  S/NT Msk:  L elbow - tender L 5th digit contracted with a splint on Lumbar-sacral spine is tender to palpation over paraspinal muscles and painfull with the ROM  Neurologic:  L hand inteross muscles are tender and fingers 4-5 contractures L forearm w/ less atrophy Skin:  Intact without suspicious lesions or rashes Psych:  Cognition and judgment appear intact. Alert and cooperative with normal attention span and concentration. No apparent delusions, illusions, hallucinations   Impression & Recommendations:  Problem # 1:  RESTLESS LEG SYNDROME (ICD-333.94) Assessment Deteriorated On prescription therapy - increase Requip  Problem # 2:  OSTEOARTHRITIS (ICD-715.90) Assessment: Deteriorated  The following medications were removed from the medication list:    Hydrocodone-acetaminophen 5-325 Mg Tabs (Hydrocodone-acetaminophen) .[name]... 1 by mouth up to 4 times per day as needed for pain    Hydromorphone Hcl 2 Mg Tabs (Hydromorphone hcl) .[name]... 2 qhs His updated medication list for this problem includes:  Aspirin 81 Mg Tbec (Aspirin) ..... One by mouth every day    Hydrocodone-acetaminophen 7.5-325 Mg Tabs (Hydrocodone-acetaminophen) .[name]... 1 by mouth qid as needed pain  Problem # 3:  DEPRESSION (ICD-311) Assessment: Improved  Problem # 4:  NEUROPATHY, OTHER INFLAMMATORY AND TOXIC (ICD-357.89) Assessment: Unchanged On prescription therapy   Problem # 5:  LOW BACK PAIN  (ICD-724.2) Assessment: Unchanged  The following medications were removed from the medication list:    Hydrocodone-acetaminophen 5-325 Mg Tabs (Hydrocodone-acetaminophen) .[name]... 1 by mouth up to 4 times per day as needed for pain    Hydromorphone Hcl 2 Mg Tabs (Hydromorphone hcl) .[name]... 2 qhs His updated medication list for this problem includes:    Aspirin 81 Mg Tbec (Aspirin) ..... One by mouth every day    Hydrocodone-acetaminophen 7.5-325 Mg Tabs (Hydrocodone-acetaminophen) .[name]... 1 by mouth qid as needed pain  Complete Medication List: 1)  Travatan 0.004 % Soln (Travoprost) .... One drop op qhs 2)  Vitamin D3 1000 Unit Tabs (Cholecalciferol) .[name].. 1 by mouth daily 3)  Aspirin 81 Mg Tbec (Aspirin) .... One by mouth every day 4)  Multivitamins Tabs (Multiple vitamin) .... Once daily 5)  Triamcinolone Acetonide 0.1 % Oint (Triamcinolone acetonide) .... Use  qid prn 6)  Miralax Powd (Polyethylene glycol 3350) .[name].. 17g by mouth once daily as needed constipation 7)  Loratadine 10 Mg Tabs (Loratadine) .... Once daily as needed allergies 8)  Topicort 0.25 % Oint (Desoximetasone) .... Qid on hands 9)  Triamc 0.5% Cream in Eucerin Lotion 1:10  .... Use once daily prn 10)  Hydrocodone-acetaminophen 7.5-325 Mg Tabs (Hydrocodone-acetaminophen) .[name].. 1 by mouth qid as needed pain 11)  Requip 1 Mg Tabs (Ropinirole hcl) .[name].. 1-2 by mouth at bedtime as needed restless legs 12)  Cialis 20 Mg Tabs (Tadalafil) .... 1/2 or 1 by mouth q 1-3 d prn  Patient Instructions: 1)  Please schedule a follow-up appointment in 3 months. 2)  BMP prior to visit, ICD-9: 3)  Hepatic Panel prior to visit, ICD-9:401.1 4)  Use stretching exercises that I have provided (15 min. or longer every day)  Prescriptions: CIALIS 20 MG TABS (TADALAFIL) 1/2 or 1 by mouth q 1-3 d prn  #6 x 3   Entered and Authorized by:   Tresa Garter MD   Signed by:   Tresa Garter MD on 07/27/2009   Method used:   Print then Give to  Patient   RxID:   [id number] REQUIP 1 MG TABS (ROPINIROLE HCL) 1-2 by mouth at bedtime as needed restless legs  #60 x 6   Entered and Authorized by:   Tresa Garter MD   Signed by:   Tresa Garter MD on 07/27/2009   Method used:   Print then Give to Patient   RxID:   [id number] HYDROCODONE-ACETAMINOPHEN 7.5-325 MG TABS (HYDROCODONE-ACETAMINOPHEN) 1 by mouth qid as needed pain  #120 x 2   Entered and Authorized by:   Tresa Garter MD   Signed by:   Tresa Garter MD on 07/27/2009   Method used:   Print then Give to Patient   RxID:   [id number]

## 2010-08-02 NOTE — Assessment & Plan Note (Signed)
Summary: 4 MO ROV /NWS   Vital Signs:  Patient profile:   70 year old male Height:      72 inches (182.88 cm) Weight:      194 pounds (88.18 kg) BMI:     26.41 O2 Sat:      94 % on Room air Temp:     97.4 degrees F (36.33 degrees C) oral Pulse rate:   66 / minute BP sitting:   112 / 70  (left arm) Cuff size:   regular  Vitals Entered By: Lucious Groves CMA (March 01, 2010 10:52 AM)  O2 Flow:  Room air CC: 4 mo rtn ov./kb Is Patient Diabetic? No Pain Assessment Patient in pain? no      Comments Patient states that he is not using Triamcinolone or Topicort. Lucious Groves CMA  March 01, 2010 10:52 AM    CC:  4 mo rtn ov./kb.  History of Present Illness: The patient presents for a follow up of back pain, anxiety, depression C/o moderate pain in R side CP since Sat -sudden onset - no injury. No SOB or cough  Current Medications (verified): 1)  Travatan 0.004 %  Soln (Travoprost) .... One Drop Op Qhs 2)  Vitamin D3 1000 Unit  Tabs (Cholecalciferol) .[name].. 1 By Mouth Daily 3)  Aspirin 81 Mg  Tbec (Aspirin) .... One By Mouth Every Day 4)  Multivitamins  Tabs (Multiple Vitamin) .... Once Daily 5)  Triamcinolone Acetonide 0.1 % Oint (Triamcinolone Acetonide) .... Use  Qid Prn 6)  Miralax   Powd (Polyethylene Glycol 3350) .[name].. 17g By Mouth Once Daily As Needed Constipation 7)  Loratadine 10 Mg  Tabs (Loratadine) .... Once Daily As Needed Allergies 8)  Topicort 0.25 % Oint (Desoximetasone) .... Qid On Hands 9)  Triamc 0.5% Cream in Eucerin Lotion 1:10 .... Use Once Daily Prn 10)  Hydrocodone-Acetaminophen 7.5-325 Mg Tabs (Hydrocodone-Acetaminophen) .[name].. 1 By Mouth Qid As Needed Pain 11)  Requip 1 Mg Tabs (Ropinirole Hcl) .[name].. 1-2 By Mouth At Bedtime As Needed Restless Legs 12)  Cialis 20 Mg Tabs (Tadalafil) .... 1/2 or 1 By Mouth Q 1-3 D Prn  Allergies (verified): 1)  Levaquin  Past History:  Past Medical History: Last updated: 07/27/2009 Osteoarthritis Depression Melanoma x3  2009 Dr Amy [place] Basal Cell Carcinoma - right chest Chronic Insomnia on Ambien x 2 years ? 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  Also worked as Research officer, trade union. Married for 45 years 2 children  Review of Systems       The patient complains of chest pain.  The patient denies syncope, dyspnea on exertion, and peripheral edema.         no leg cramps  Physical Exam  General:  Well-developed,well-nourished,in no acute distress; alert,appropriate and cooperative throughout examination Nose:  External nasal examination shows no deformity or inflammation. Nasal mucosa are pink and moist without lesions or exudates. Mouth:  Moist Neck:  No deformities, masses, or tenderness noted. Lungs:  Normal respiratory effort, chest expands symmetrically. Lungs are clear to auscultation, no crackles or wheezes. Heart:  Normal rate and regular rhythm. S1 and S2 normal without gallop,  murmur, click, rub or other extra sounds. Abdomen:  S/NT Msk:  chest NT B Neurologic:  alert & oriented X3.   Heel to toe almost OK Skin:  Intact without suspicious lesions or rashes Psych:  Cognition and judgment appear intact. Alert and cooperative with normal attention span and concentration. No apparent delusions, illusions, hallucinations   Impression & Recommendations:  Problem # 1:  CHEST PAIN (ICD-786.50) R - pleuritic Assessment New  Orders: TLB-BMP (Basic Metabolic Panel-BMET) (80048-METABOL) TLB-CBC Platelet - w/Differential (85025-CBCD) TLB-Sedimentation Rate (ESR) (85652-ESR) Radiology Referral (Radiology) CT chest to r/o PE TLB-Udip  ONLY (81003-UDIP) CK Isoenzymes (27253-66440)  Problem # 2:  LOW BACK PAIN (ICD-724.2) Assessment: Unchanged  His updated medication list for this problem includes:    Aspirin 81 Mg Tbec (Aspirin) ..... One by mouth every day    Hydrocodone-acetaminophen 7.5-325 Mg Tabs (Hydrocodone-acetaminophen) .[name]... 1 by mouth qid as needed pain  Problem # 3:  DEPRESSION (ICD-311) Assessment: Improved  Problem # 4:  INSOMNIA, CHRONIC (ICD-307.42) Assessment: Comment Only  Complete Medication List: 1)  Travatan 0.004 % Soln (Travoprost) .... One drop op qhs 2)  Vitamin D3 1000 Unit Tabs (Cholecalciferol) .[name].. 1 by mouth daily 3)  Aspirin 81 Mg Tbec (Aspirin) .... One by mouth every day 4)  Multivitamins Tabs (Multiple vitamin) .... Once daily 5)  Triamcinolone Acetonide 0.1 % Oint (Triamcinolone acetonide) .... Use  qid prn 6)  Miralax Powd (Polyethylene glycol 3350) .[name].. 17g by mouth once daily as needed constipation 7)  Loratadine 10 Mg Tabs (Loratadine) .... Once daily as needed allergies 8)  Topicort 0.25 % Oint (Desoximetasone) .... Qid on hands 9)  Triamc 0.5% Cream in Eucerin Lotion 1:10  .... Use once daily prn 10)  Hydrocodone-acetaminophen 7.5-325 Mg Tabs (Hydrocodone-acetaminophen) .[name].. 1 by mouth qid as needed pain 11)  Requip 1 Mg Tabs (Ropinirole hcl) .[name].. 1-2 by mouth at bedtime as needed restless legs 12)  Cialis 20 Mg Tabs (Tadalafil) .... 1/2 or 1 by mouth q 1-3 d prn  Patient Instructions: 1)  Call if you are not better in a reasonable amount of time or if worse. Go to ER if feeling really bad!  2)  Please schedule a follow-up appointment in 1-2 weeks.

## 2010-08-02 NOTE — Progress Notes (Signed)
Summary: Hydrocodone refill  Phone Note Refill Request Message from:  Fax from Pharmacy on October 19, 2009 4:13 PM  Refills Requested: Medication #1:  HYDROCODONE-ACETAMINOPHEN 7.5-325 MG TABS 1 by mouth qid as needed pain Next Appointment Scheduled: 10-27-09 Initial call taken by: Lucious Groves,  October 19, 2009 4:14 PM  Follow-up for Phone Call        ok 3 ref Follow-up by: Tresa Garter MD,  October 19, 2009 5:39 PM    Prescriptions: HYDROCODONE-ACETAMINOPHEN 7.5-325 MG TABS (HYDROCODONE-ACETAMINOPHEN) 1 by mouth qid as needed pain  #120 x 2   Entered and Authorized by:   Margaret Pyle, CMA   Signed by:   Margaret Pyle, CMA on 10/20/2009   Method used:   Telephoned to ...       [facility] [address] (retail)       [address]       [place], [place]  [address]       Ph: [phone] or [phone]       Fax: [phone]   RxID:   [id number]

## 2010-08-02 NOTE — Assessment & Plan Note (Signed)
Summary: 3 MO ROV /NWS #   Vital Signs:  Patient profile:   70 year old male Height:      72 inches Weight:      192.50 pounds BMI:     26.20 O2 Sat:      95 % on Room air Temp:     97.3 degrees F oral Pulse rate:   62 / minute BP sitting:   134 / 80  (left arm) Cuff size:   regular  Vitals Entered By: Lucious Groves (October 27, 2009 11:00 AM)  O2 Flow:  Room air CC: 3 mo rtn ov./kb Is Patient Diabetic? No Pain Assessment Patient in pain? no        CC:  3 mo rtn ov./kb.  History of Present Illness: The patient presents for a follow up of OA, LBP, depression  Current Medications (verified): 1)  Travatan 0.004 %  Soln (Travoprost) .... One Drop Op Qhs 2)  Vitamin D3 1000 Unit  Tabs (Cholecalciferol) .[name].. 1 By Mouth Daily 3)  Aspirin 81 Mg  Tbec (Aspirin) .... One By Mouth Every Day 4)  Multivitamins  Tabs (Multiple Vitamin) .... Once Daily 5)  Triamcinolone Acetonide 0.1 % Oint (Triamcinolone Acetonide) .... Use  Qid Prn 6)  Miralax   Powd (Polyethylene Glycol 3350) .[name].. 17g By Mouth Once Daily As Needed Constipation 7)  Loratadine 10 Mg  Tabs (Loratadine) .... Once Daily As Needed Allergies 8)  Topicort 0.25 % Oint (Desoximetasone) .... Qid On Hands 9)  Triamc 0.5% Cream in Eucerin Lotion 1:10 .... Use Once Daily Prn 10)  Hydrocodone-Acetaminophen 7.5-325 Mg Tabs (Hydrocodone-Acetaminophen) .[name].. 1 By Mouth Qid As Needed Pain 11)  Requip 1 Mg Tabs (Ropinirole Hcl) .[name].. 1-2 By Mouth At Bedtime As Needed Restless Legs 12)  Cialis 20 Mg Tabs (Tadalafil) .... 1/2 or 1 By Mouth Q 1-3 D Prn  Allergies (verified): 1)  Levaquin  Past History:  Past Medical History: Last updated: 07/27/2009 Osteoarthritis Depression Melanoma x3 2009 Dr Amy [place] Basal Cell Carcinoma - right chest Chronic Insomnia on Ambien x 2 years ? 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         Dizzy Larey Seat once - tripped  Physical Exam  General:  Well-developed,well-nourished,in no acute distress; alert,appropriate and cooperative throughout examination Eyes:  No corneal or conjunctival inflammation noted. EOMI. Perrla. Mouth:  Moist Neck:  No deformities, masses, or tenderness noted. Lungs:  Normal respiratory effort, chest expands symmetrically. Lungs are clear to auscultation, no crackles or wheezes. Heart:  Normal rate and regular rhythm. S1 and S2 normal without gallop, murmur, click, rub or other extra sounds. Abdomen:  S/NT Msk:  L elbow -less  tender L 5th digit contracted with a splint on Lumbar-sacral spine is tender to palpation over paraspinal muscles and painfull with the  R 1st MCP - tender Extremities:  No edema B Neurologic:  alert & oriented X3.   Heel to toe almost OK Skin:  Intact without suspicious lesions or rashes  Psych:  Cognition and judgment appear intact. Alert and cooperative with normal attention span and concentration. No apparent delusions, illusions, hallucinations   Impression & Recommendations:  Problem # 1:  LOW BACK PAIN (ICD-724.2) Assessment Unchanged  His updated medication list for this problem includes:    Aspirin 81 Mg Tbec (Aspirin) ..... One by mouth every day    Hydrocodone-acetaminophen 7.5-325 Mg Tabs (Hydrocodone-acetaminophen) .[name]... 1 by mouth qid as needed pain  Problem # 2:  INSOMNIA, CHRONIC (ICD-307.42) Assessment: Unchanged  Problem # 3:  NEUROPATHY, OTHER INFLAMMATORY AND TOXIC (ICD-357.89) Assessment:  Unchanged  Problem # 4:  OSTEOARTHRITIS (ICD-715.90) Assessment: Unchanged  His updated medication list for this problem includes:    Aspirin 81 Mg Tbec (Aspirin) ..... One by mouth every day    Hydrocodone-acetaminophen 7.5-325 Mg Tabs (Hydrocodone-acetaminophen) .[name]... 1 by mouth qid as needed pain  Problem # 5:  Stress w/sick wife Assessment: Deteriorated Discussed - coping OK  Complete Medication List: 1)  Travatan 0.004 % Soln (Travoprost) .... One drop op qhs 2)  Vitamin D3 1000 Unit Tabs (Cholecalciferol) .[name].. 1 by mouth daily 3)  Aspirin 81 Mg Tbec (Aspirin) .... One by mouth every day 4)  Multivitamins Tabs (Multiple vitamin) .... Once daily 5)  Triamcinolone Acetonide 0.1 % Oint (Triamcinolone acetonide) .... Use  qid prn 6)  Miralax Powd (Polyethylene glycol 3350) .[name].. 17g by mouth once daily as needed constipation 7)  Loratadine 10 Mg Tabs (Loratadine) .... Once daily as needed allergies 8)  Topicort 0.25 % Oint (Desoximetasone) .... Qid on hands 9)  Triamc 0.5% Cream in Eucerin Lotion 1:10  .... Use once daily prn 10)  Hydrocodone-acetaminophen 7.5-325 Mg Tabs (Hydrocodone-acetaminophen) .[name].. 1 by mouth qid as needed pain 11)  Requip 1 Mg Tabs (Ropinirole hcl) .[name].. 1-2 by mouth at bedtime as needed restless legs 12)  Cialis 20 Mg Tabs (Tadalafil) .... 1/2 or 1 by mouth q 1-3 d prn  Other Orders: Pneumococcal Vaccine (16109) Admin 1st Vaccine (60454) Admin 1st Vaccine [facility]) [phone])  Patient Instructions: 1)  Please schedule a follow-up appointment in 4 months. 2)  Use stretching and balance exercises that I have provided (15 min. or longer every day) 3)  Balance exercises   Pneumovax Vaccine    Vaccine Type: Pneumovax    Site: right deltoid    Mfr: Merck    Dose: 0.5 ml    Route: IM    Given by: Rock Nephew CMA    Exp. Date: 02/12/2011    Lot #: [id number]    VIS given: 01/29/96 version given October 27, 2009.

## 2010-08-02 NOTE — Progress Notes (Signed)
Summary: Vicodin  Phone Note Call from Patient Call back at Home Phone [phone]   Caller: [phone] Summary of Call: Pt says that he had discussed getting rx for vicodin 5/325. Patient is requesting this rx, wants 4 daily.  Initial call taken by: Lamar Sprinkles,  May 05, 2008 11:46 AM  Follow-up for Phone Call        OK 120, 2 ref Follow-up by: Tresa Garter MD,  May 05, 2008 1:13 PM  Additional Follow-up for Phone Call Additional follow up Details #1::        Does this replace the vicodin 5/500 or is this in addition?? Additional Follow-up by: Lamar Sprinkles,  May 05, 2008 1:39 PM    Additional Follow-up for Phone Call Additional follow up Details #2::    Replace, pls Follow-up by: Tresa Garter MD,  May 05, 2008 5:36 PM  Additional Follow-up for Phone Call Additional follow up Details #3:: Details for Additional Follow-up Action Taken: Pt informed  Additional Follow-up by: Lamar Sprinkles,  May 05, 2008 5:48 PM  New/Updated Medications: HYDROCODONE-ACETAMINOPHEN 5-325 MG TABS (HYDROCODONE-ACETAMINOPHEN) 1 by mouth up to 4 times per day as needed for pain   Prescriptions: HYDROCODONE-ACETAMINOPHEN 5-325 MG TABS (HYDROCODONE-ACETAMINOPHEN) 1 by mouth up to 4 times per day as needed for pain  #120 x 2   Entered by:   Lamar Sprinkles   Authorized by:   Tresa Garter MD   Signed by:   Lamar Sprinkles on 05/05/2008   Method used:   Telephoned to ...       [phone] ([facility]ail)       [address]       [place], [place]  [address]       Ph: [phone] or [phone]       Fax: [phone]   RxID:   [id number]

## 2010-08-02 NOTE — Progress Notes (Signed)
Summary: HYDROCODONE  Phone Note Refill Request Message from:  Fax from Pharmacy on August 04, 2008 12:24 PM  Refills Requested: Medication #1:  HYDROCODONE-ACETAMINOPHEN 5-325 MG TABS 1 by mouth up to 4 times per day as needed for pain. # 120   Last Ref[facility]: [facility] [address] [phone]  Initial call taken by: Orlan Leavens,  August 04, 2008 12:25 PM  Follow-up for Phone Call        Done on 1/27 Follow-up by: Tresa Garter MD,  August 04, 2008 12:54 PM

## 2010-08-02 NOTE — Progress Notes (Signed)
Summary: NEW PT?   Phone Note Call from Patient Call back at [facility] Phone [phone]   Caller: Patient Summary of Call: [name] CALLED REQUESTING TO BE A NEW PT WITH DR PLOTNIKOV.  HE HAS [employer] MEDICARE COMPLETE.  HE SAYS HE SAW DR. Artist Pais IN [place] [facility] AND WAS REFERRED TO [place].  I HAVE EXPLAINED THAT WE ARE NOT TAKING NEW MEDICARE AND THAT DR PLOTNIKOV IS NOT TAKING NEW PT'S.  HE STATES HIS WIFE IS A PT OF DR PLOTNIKOV. DO YOU WANT TO ACCEPT HIM AS A NEW PT? Initial call taken by: Hilarie Fredrickson,  December 26, 2007 10:20 AM  Follow-up for Phone Call        OK with me Follow-up by: Tresa Garter MD,  December 26, 2007 1:14 PM  Additional Follow-up for Phone Call Additional follow up Details #1::        PT IS AWARE.  HE HAS A NEW PT APPT ON January 21, 2008. Additional Follow-up by: Hilarie Fredrickson,  December 27, 2007 11:13 AM

## 2010-08-02 NOTE — Assessment & Plan Note (Signed)
Summary: low abd discomfort since sunday SD   Vital Signs:  Patient profile:   70 year old male Weight:      186 pounds Temp:     97 .7 degrees F oral Pulse rate:   75 / minute BP sitting:   116 / 76  (left arm)  Vitals Entered By: Tora Perches (March 26, 2009 4:29 PM) CC: lower abd pain Is Patient Diabetic? No   CC:  lower abd pain.  History of Present Illness: C/o bad LLQ abd pain since Sun - off and on - more constant than not 7-9/10, gas. No fever  Current Medications (verified): 1)  Travatan 0.004 %  Soln (Travoprost) .... One Drop Op Qhs 2)  Vitamin D3 1000 Unit  Tabs (Cholecalciferol) .[name].. 1 By Mouth Daily 3)  Aspirin 81 Mg  Tbec (Aspirin) .... One By Mouth Every Day 4)  Multivitamins  Tabs (Multiple Vitamin) .... Once Daily 5)  Triamcinolone Acetonide 0.1 % Oint (Triamcinolone Acetonide) .... Use  Qid Prn 6)  Miralax   Powd (Polyethylene Glycol 3350) .[name].. 17g By Mouth Once Daily As Needed Constipation 7)  Hydrocodone-Acetaminophen 5-325 Mg Tabs (Hydrocodone-Acetaminophen) .[name].. 1 By Mouth Up To 4 Times Per Day As Needed For Pain 8)  Hydromorphone Hcl 2 Mg Tabs (Hydromorphone Hcl) .... 2 Qhs 9)  Loratadine 10 Mg  Tabs (Loratadine) .... Once Daily As Needed Allergies 10)  Topicort 0.25 % Oint (Desoximetasone) .... Qid On Hands 11)  Triamc 0.5% Cream in Eucerin Lotion 1:10 .... Use Once Daily Prn 12)  Requip 0.5 Mg Tabs (Ropinirole Hcl) .[name].. 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 Married for 45 years 2 children  Past Medical History: Osteoarthritis Depression Melanoma x3 2009 Dr Amy [place] Basal Cell Carcinoma - right chest Chronic Insomnia on Ambien x 2 years ? Restless leg syndrom History of  TIA's History of dizzy spells History of tinnitus  Glaucoma History of alcoholism (dry 13 years) History of tobacco abuse L elbow Fx Dr Carola Frost 2009 Diverticulosis, colon  Physical Exam  General:  Well-developed,well-nourished,in no acute distress; alert,appropriate and cooperative throughout examination Mouth:  Moist Lungs:  Normal respiratory effort, chest expands symmetrically. Lungs are clear to auscultation, no crackles or wheezes. Heart:  Normal rate and regular rhythm. S1 and S2 normal without gallop, murmur, click, rub or other extra sounds. Abdomen:  Tender LLQ - no rebound Skin:  Intact without suspicious lesions or rashes   Impression & Recommendations:  Problem # 1:  ABDOMINAL PAIN, LOWER (ICD-789.09) Assessment New  His updated medication list for this problem includes:    Aspirin 81 Mg Tbec (Aspirin) ..... One by mouth every day    Hydrocodone-acetaminophen 5-325 Mg Tabs (Hydrocodone-acetaminophen) .[name]... 1 by mouth up to 4 times per day as needed for pain    Hydromorphone Hcl 2 Mg Tabs (Hydromorphone hcl) .[name]... 2 qhs  Orders: TLB-BMP (Basic Metabolic Panel-BMET) (80048-METABOL) TLB-Hepatic/Liver Function Pnl (80076-HEPATIC) TLB-CBC Platelet - w/Differential (85025-CBCD) TLB-Sedimentation Rate (ESR) (85652-ESR) TLB-Udip ONLY (81003-UDIP) Rocephin  250mg  (Z6109) Admin of Therapeutic Inj  intramuscular or subcutaneous (60454)  Problem # 2:  DIVERTICULITIS (ICD-562.11) Assessment: New Rocephin IM Abx by mouth  See "Patient Instructions".  Orders: TLB-BMP (Basic Metabolic Panel-BMET) (80048-METABOL) TLB-Hepatic/Liver Function  Pnl (80076-HEPATIC) TLB-CBC Platelet - w/Differential (85025-CBCD) TLB-Sedimentation Rate (ESR) (85652-ESR) TLB-Udip ONLY (81003-UDIP)  Problem # 3:  DIVERTICULOSIS, COLON (ICD-562.10) Assessment: Comment Only Had a colon 2 y ago  Complete Medication List: 1)  Travatan 0.004 % Soln (Travoprost) .... One drop op qhs 2)  Vitamin D3  1000 Unit Tabs (Cholecalciferol) .[name].. 1 by mouth daily 3)  Aspirin 81 Mg Tbec (Aspirin) .... One by mouth every day 4)  Multivitamins Tabs (Multiple vitamin) .... Once daily 5)  Triamcinolone Acetonide 0.1 % Oint (Triamcinolone acetonide) .... Use  qid prn 6)  Miralax Powd (Polyethylene glycol 3350) .[name].. 17g by mouth once daily as needed constipation 7)  Hydrocodone-acetaminophen 5-325 Mg Tabs (Hydrocodone-acetaminophen) .[name].. 1 by mouth up to 4 times per day as needed for pain 8)  Hydromorphone Hcl 2 Mg Tabs (Hydromorphone hcl) .... 2 qhs 9)  Loratadine 10 Mg Tabs (Loratadine) .... Once daily as needed allergies 10)  Topicort 0.25 % Oint (Desoximetasone) .... Qid on hands 11)  Triamc 0.5% Cream in Eucerin Lotion 1:10  .... Use once daily prn 12)  Requip 0.5 Mg Tabs (Ropinirole hcl) .[name].. 1-2 by mouth at bedtime as needed resless legs 13)  Cefuroxime Axetil 500 Mg Tabs (Cefuroxime axetil) .[name].. 1 by mouth 2 times daily 14)  Metronidazole 500 Mg Tabs (Metronidazole) .[name].. 1 by mouth three times a day x 7 d  Patient Instructions: 1)  Please schedule a follow-up appointment in 2 weeks. 2)  Low residue diet 10 days 3)  Call if you are not better in a reasonable amount of time or if worse. Go to ER if feeling really bad! Prescriptions: METRONIDAZOLE 500 MG  TABS (METRONIDAZOLE) 1 by mouth three times a day x 7 d  #21 x 0   Entered and Authorized by:   Tresa Garter MD   Signed by:   Tresa Garter MD on 03/26/2009   Method used:   [facility] to        [phone][facility] ([facility]ail[facility]       [address]       [place], [place]  [address]       Ph: [phone] or [phone]       Fax: [phone]   RxID:   [id number] CEFUROXIME AXETIL 500 MG  TABS (CEFUROXIME AXETIL) 1 by mouth 2 times daily  #20 x 0   Entered and Authorized by:   Tresa Garter MD   Signed by:   Tresa Garter MD on 03/26/2009   Method used:   Electronically to        [facility] [phone]* (retail)        [address]       [place], [place]  [address]       Ph: [phone] or [phone]       Fax: [phone]   RxID:   [id number]    Medication Administration  Injection # 1:    Medication: Rocephin  250mg     Diagnosis: ABDOMINAL PAIN, LOWER (ICD-789.09)    Route: IM    Site: LUOQ gluteus    Exp Date: 09/2011    Lot #: [id number]    Mfr: sandoz    Comments: 1 gram given    Patient tolerated injection without complications    Given by: Tora Perches (March 26, 2009 5:03 PM)  Orders Added: 1)  TLB-BMP (Basic Metabolic Panel-BMET) [80048-METABOL] 2)  TLB-Hepatic/Liver Function Pnl [80076-HEPATIC] 3)  TLB-CBC Platelet - w/Differential [85025-CBCD] 4)  TLB-Sedimentation Rate (ESR) [85652-ESR] 5)  TLB-Udip ONLY [81003-UDIP] 6)  Rocephin  250mg  [J0696] 7)  Admin of Therapeutic Inj  intramuscular or subcutaneous [96372] 8)  Est. Patient Level IV [16109]

## 2010-08-02 NOTE — Progress Notes (Signed)
Summary: hydrocodone  Phone Note Refill Request Message from:  Patient on January 29, 2009 9:34 AM  Refills Requested: Medication #1:  HYDROCODONE-ACETAMINOPHEN 5-325 MG TABS 1 by mouth up to 4 times per day as needed for pain #120   [facility]: [facility] [phone] Last ov 12/28/08  Next Appointment Scheduled: 03/31/09 Initial call taken by: Orlan Leavens,  January 29, 2009 9:35 AM  Follow-up for Phone Call        Dr. Jonny Ruiz since dr Patrena Santalucia is out of office is it ok to refill pain med Follow-up by: Orlan Leavens,  January 29, 2009 9:36 AM  Additional Follow-up for Phone Call Additional follow up Details #1::        done hardcopy to LIM side B - debra  Additional Follow-up by: Corwin Levins MD,  January 29, 2009 12:05 PM    Additional Follow-up for Phone Call Additional follow up Details #2::    on side A with sara Follow-up by: Shelbie Proctor,  January 29, 2009 2:04 PM  Additional Follow-up for Phone Call Additional follow up Details #3:: Details for Additional Follow-up Action Taken: Called into pharm, original shredded Additional Follow-up by: Lamar Sprinkles,  January 29, 2009 5:46 PM  New/Updated Medications: HYDROCODONE-ACETAMINOPHEN 5-325 MG TABS (HYDROCODONE-ACETAMINOPHEN) 1 by mouth up to 4 times per day as needed for pain Prescriptions: HYDROCODONE-ACETAMINOPHEN 5-325 MG TABS (HYDROCODONE-ACETAMINOPHEN) 1 by mouth up to 4 times per day as needed for pain  #120 x 1   Entered and Authorized by:   Corwin Levins MD   Signed by:   Corwin Levins MD on 01/29/2009   Method used:   Print then Give to Patient   RxID:   [id number]

## 2010-08-02 NOTE — Assessment & Plan Note (Signed)
Summary: NEW PT / [employer] MEDICARE COMPLETE/ PER PHONE NOTE/ WAS IN THE HO...   Vital Signs:  Patient Profile:   70 Years Old Male Height:     72 inches Weight:      186 pounds Temp:     96.7 degrees F oral Pulse rate:   64 / minute BP sitting:   122 / 76  (left arm)  Vitals Entered By: Tora Perches (January 21, 2008 9:40 AM)             Is Patient Diabetic? No     Chief Complaint:  new pt to est..  History of Present Illness: The patient presents for a post-hospital visit for L elbow fx, poss concussion, depression, Vit D def. C/o  severeL elbow pain, L hand weakness x since 6/17. Getting better with home PT     Current Allergies: No known allergies   Past Medical History:    Reviewed history from 12/23/2007 and no changes required:       Osteoarthritis       Depression       Melanoma x3 2009 Dr Amy [place]       Basal Cell Carcinoma - right chest       Chronic Insomnia on Ambien x 2 years       ? Restless leg syndrom       History of TIA's       History of dizzy spells       History of tinnitus        Glaucoma       History of alcoholism (dry 13 years)       History of tobacco abuse       L elbow Fx Dr Carola Frost 2009  Past Surgical History:    Reviewed history from 12/23/2007 and no changes required:       Status post melanoma excision x 3 2009       Cataract extraction       L elbow reconstruction Dr Carola Frost 2009   Family History:    Reviewed history from 12/23/2007 and no changes required:       Father deceased age 82 -Lung cancer       Mother deceased ? 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  Also worked as Research officer, trade union.       Married for 45 years       2 children   Risk Factors:  Tobacco use:  quit   Review of Systems  The patient  denies fever, chest pain, prolonged cough, abdominal pain, melena, vision loss, peripheral edema, difficulty walking, unusual weight change, and angioedema.     Physical Exam  General:     Well-developed,well-nourished,in no acute distress; alert,appropriate and cooperative throughout examination Head:     Normocephalic and atraumatic without obvious abnormalities. No apparent alopecia or balding. Eyes:     No corneal or conjunctival inflammation noted. EOMI. Perrla. Funduscopic exam benign, without hemorrhages, exudates or papilledema. Vision grossly normal. Ears:     External ear exam shows no significant lesions or deformities.  Otoscopic examination reveals clear canals, tympanic membranes are intact bilaterally without bulging, retraction,  inflammation or discharge. Hearing is grossly normal bilaterally. Nose:     External nasal examination shows no deformity or inflammation. Nasal mucosa are pink and moist without lesions or exudates. Mouth:     Oral mucosa and oropharynx without lesions or exudates.  Teeth in good repair. Neck:     No deformities, masses, or tenderness noted. Lungs:     Normal respiratory effort, chest expands symmetrically. Lungs are clear to auscultation, no crackles or wheezes. Heart:     Normal rate and regular rhythm. S1 and S2 normal without gallop, murmur, click, rub or other extra sounds. Abdomen:     Bowel sounds positive,abdomen soft and non-tender without masses, organomegaly or hernias noted. Msk:     L arm is in a brace Pulses:     R and L carotid,radial,femoral,dorsalis pedis and posterior tibial pulses are full and equal bilaterally Extremities:     No clubbing, cyanosis, edema, or deformity noted with normal full range of motion of all joints.   Neurologic:     No cranial nerve deficits noted. Station and gait are normal. Plantar reflexes are down-going bilaterally. DTRs are symmetrical throughout. Sensory, motor and coordinative functions  appear intact. Skin:     Intact without suspicious lesions or rashes Bruise on L cheek Psych:     Cognition and judgment appear intact. Alert and cooperative with normal attention span and concentration. No apparent delusions, illusions, hallucinations    Impression & Recommendations:  Problem # 1:  DEPRESSION (ICD-311) Assessment: Improved  His updated medication list for this problem includes:    Cymbalta 30 Mg Cpep (Duloxetine hcl) ..... One by mouth q 72 hrs weaning himself off  His updated medication list for this problem includes:    Cymbalta 30 Mg Cpep (Duloxetine hcl) ..... One by mouth q 72 hrs   Problem # 2:  FRACTURE, ARM, LEFT (ICD-818.0) Assessment: Improved Recovering. Hosp records reviewed > 45 min  Problem # 3:  SYNCOPE (ICD-780.2) Assessment: Comment Only No recurrence  Problem # 4:  DIZZINESS (ICD-780.4) Assessment: Improved  Problem # 5:  INSOMNIA, CHRONIC (ICD-307.42) Assessment: Unchanged Valerian root or melatonin at hs prn  Problem # 6:  OSTEOARTHRITIS (ICD-715.90) Assessment: Unchanged  His updated medication list for this problem includes:    Vicodin 5-500 Mg Tabs (Hydrocodone-acetaminophen) ..... One by mouth qid as needed has been taking for years  His updated medication list for this problem includes:    Vicodin 5-500 Mg Tabs (Hydrocodone-acetaminophen) ..... One by mouth qid prn    Aspirin 81 Mg Tbec (Aspirin) ..... One by mouth every day   Complete Medication List: 1)  Cymbalta 30 Mg Cpep (Duloxetine hcl) .... One by mouth q 72 hrs 2)  Travatan 0.004 % Soln (Travoprost) .... One drop op qhs 3)  Vicodin 5-500 Mg Tabs (Hydrocodone-acetaminophen) .... One by mouth qid prn 4)  Vitamin D3 1000 Unit Tabs (Cholecalciferol) .... 4 by mouth daily 5)  Aspirin 81 Mg Tbec (Aspirin) .... One by mouth every day   Patient Instructions: 1)  BMP prior to visit, ICD-9: 2)  TSH prior to visit, ICD-9: 3)  CBC w/ Diff prior to visit, ICD-9: 4)  Vit  B12 782.0 5)  Vit D 268.9 6)  Please schedule a follow-up appointment in 2 months.   ]

## 2010-08-04 NOTE — Assessment & Plan Note (Signed)
Summary: yearly medicare/#/cd   Vital Signs:  Patient profile:   70 year old male Height:      72 inches Weight:      192 pounds BMI:     26.13 Temp:     97.3 degrees F oral Pulse rate:   68 / minute Pulse rhythm:   regular Resp:     16 per minute BP sitting:   118 / 80  (left arm) Cuff size:   regular  Vitals Entered By: Lanier Prude, Beverly Gust) (June 23, 2010 9:27 AM) CC: MWV Is Patient Diabetic? No   CC:  MWV.  History of Present Illness: The patient presents for a preventive health examination   Current Medications (verified): 1)  Travatan 0.004 %  Soln (Travoprost) .... One Drop Op Qhs 2)  Vitamin D3 1000 Unit  Tabs (Cholecalciferol) .[name].. 1 By Mouth Daily 3)  Aspirin 81 Mg  Tbec (Aspirin) .... One By Mouth Every Day 4)  Multivitamins  Tabs (Multiple Vitamin) .... Once Daily 5)  Triamcinolone Acetonide 0.1 % Oint (Triamcinolone Acetonide) .... Use  Qid Prn 6)  Miralax   Powd (Polyethylene Glycol 3350) .[name].. 17g By Mouth Once Daily As Needed Constipation 7)  Loratadine 10 Mg  Tabs (Loratadine) .... Once Daily As Needed Allergies 8)  Topicort 0.25 % Oint (Desoximetasone) .... Qid On Hands 9)  Triamc 0.5% Cream in Eucerin Lotion 1:10 .... Use Once Daily Prn 10)  Hydrocodone-Acetaminophen 7.5-325 Mg Tabs (Hydrocodone-Acetaminophen) .[name].. 1 By Mouth Qid As Needed Pain 11)  Requip 1 Mg Tabs (Ropinirole Hcl) .[name].. 1-2 By Mouth At Bedtime As Needed Restless Legs 12)  Cialis 20 Mg Tabs (Tadalafil) .... 1/2 or 1 By Mouth Q 1-3 D Prn  Allergies (verified): 1)  Levaquin  Past History:  Past Medical History: Last updated: 07/27/2009 Osteoarthritis Depression Melanoma x3 2009 Dr Amy [place] Basal Cell Carcinoma - right chest Chronic Insomnia on Ambien x 2 years ? Restless leg syndrom History of TIA's History of dizzy spells History of tinnitus  Glaucoma History of alcoholism (dry 13 years) History of tobacco abuse L elbow Fx Dr Carola Frost 2009 Diverticulosis, colon Low  back pain  Past Surgical History: Last updated: 01/21/2008 Status post melanoma excision x 3 2009 Cataract extraction L elbow reconstruction Dr Carola Frost 2009  Family History: Last updated: 10-02-08 Father deceased age 43 -Lung cancer Mother deceased ? 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  Also worked as Research officer, trade union. Married for 45 years 2 children  Review of Systems  The patient denies anorexia, fever, weight loss, weight gain, vision loss, decreased hearing, hoarseness, chest pain, syncope, dyspnea on exertion, peripheral edema, prolonged cough, headaches, hemoptysis, abdominal pain, melena, hematochezia, severe indigestion/heartburn, hematuria, incontinence, genital sores, muscle weakness, suspicious skin lesions, transient blindness, difficulty walking, depression, unusual weight change, abnormal bleeding, enlarged lymph nodes, angioedema, breast masses, and testicular masses.    Physical Exam  General:  Well-developed,well-nourished,in no acute distress; alert,appropriate and cooperative throughout examination Head:  Normocephalic and atraumatic without obvious abnormalities. No apparent alopecia or balding. Eyes:  No corneal or conjunctival inflammation noted. EOMI. Perrla. Ears:  External ear exam shows no significant lesions or deformities.  Otoscopic examination reveals clear canals, tympanic membranes are intact bilaterally without bulging, retraction, inflammation or discharge. Hearing is grossly normal bilaterally. Nose:  External nasal examination shows no deformity or inflammation.  Nasal mucosa are pink and moist without lesions or exudates. Mouth:  Moist Neck:  No deformities, masses, or tenderness noted. Lungs:  Normal respiratory effort, chest expands symmetrically. Lungs are clear to auscultation, no  crackles or wheezes. Heart:  Normal rate and regular rhythm. S1 and S2 normal without gallop, murmur, click, rub or other extra sounds. Abdomen:  S/NT Rectal:  NE Genitalia:  NE Prostate:  NE Msk:  chest NT B B hands with OA L hand with post-op deformity contrctures in the digits #4,5 Pulses:  R and L carotid,radial,femoral,dorsalis pedis and posterior tibial pulses are full and equal bilaterally Extremities:  No edema B Neurologic:  alert & oriented X3.   Heel to toe almost OK Skin:  Intact without suspicious lesions or rashes   Impression & Recommendations:  Problem # 1:  HEALTH MAINTENANCE EXAM (ICD-V70.0) Assessment New  Health and age related issues were discussed. Available screening tests and vaccinations were discussed as well. Healthy life style including good diet and exercise was discussed.  Orders: TLB-BMP (Basic Metabolic Panel-BMET) (80048-METABOL) TLB-CBC Platelet - w/Differential (85025-CBCD) TLB-Hepatic/Liver Function Pnl (80076-HEPATIC) TLB-Lipid Panel (80061-LIPID) TLB-PSA (Prostate Specific Antigen) (84153-PSA) TLB-TSH (Thyroid Stimulating Hormone) (84443-TSH) TLB-Udip ONLY (81003-UDIP)  Problem # 2:  LOW BACK PAIN (ICD-724.2) Assessment: Unchanged  His updated medication list for this problem includes:    Aspirin 81 Mg Tbec (Aspirin) ..... One by mouth every day    Hydrocodone-acetaminophen 7.5-325 Mg Tabs (Hydrocodone-acetaminophen) .[name]... 1 by mouth qid as needed pain  Problem # 3:  OSTEOARTHRITIS (ICD-715.90) Assessment: Unchanged  His updated medication list for this problem includes:    Aspirin 81 Mg Tbec (Aspirin) ..... One by mouth every day    Hydrocodone-acetaminophen 7.5-325 Mg Tabs (Hydrocodone-acetaminophen) .[name]... 1 by mouth qid as needed pain  Problem # 4:  DEPRESSION (ICD-311) Assessment: Improved  Complete Medication List: 1)  Travatan 0.004 % Soln (Travoprost) .... One drop op qhs 2)  Vitamin D3 1000 Unit Tabs (Cholecalciferol)  .[name].. 1 by mouth daily 3)  Aspirin 81 Mg Tbec (Aspirin) .... One by mouth every day 4)  Multivitamins Tabs (Multiple vitamin) .... Once daily 5)  Triamcinolone Acetonide 0.1 % Oint (Triamcinolone acetonide) .... Use  qid prn 6)  Miralax Powd (Polyethylene glycol 3350) .[name].. 17g by mouth once daily as needed constipation 7)  Loratadine 10 Mg Tabs (Loratadine) .... Once daily as needed allergies 8)  Topicort 0.25 % Oint (Desoximetasone) .... Qid on hands 9)  Triamc 0.5% Cream in Eucerin Lotion 1:10  .... Use once daily prn 10)  Hydrocodone-acetaminophen 7.5-325 Mg Tabs (Hydrocodone-acetaminophen) .[name].. 1 by mouth qid as needed pain 11)  Requip 1 Mg Tabs (Ropinirole hcl) .[name].. 1-2 by mouth at bedtime as needed restless legs 12)  Cialis 20 Mg Tabs (Tadalafil) .... 1/2 or 1 by mouth q 1-3 d prn  Other Orders: TD Toxoids IM 7 YR + (16109) Admin 1st Vaccine (60454)  Patient Instructions: 1)  Please schedule a follow-up appointment in 3 months.   Orders Added: 1)  TD Toxoids IM 7 YR + [90714] 2)  Admin 1st Vaccine [90471] 3)  TLB-BMP (Basic Metabolic Panel-BMET) [80048-METABOL] 4)  TLB-CBC Platelet - w/Differential [85025-CBCD] 5)  TLB-Hepatic/Liver Function Pnl [80076-HEPATIC] 6)  TLB-Lipid Panel [80061-LIPID] 7)  TLB-PSA (Prostate Specific Antigen) [84153-PSA] 8)  TLB-TSH (Thyroid Stimulating Hormone) [84443-TSH] 9)  TLB-Udip ONLY [81003-UDIP] 10)  Est. Patient 65& > [[address]11]   Immunizations Administered:  Tetanus Vaccine:    Vaccine Type: Td    Site: right  deltoid    Mfr: Sanofi Pasteur    Dose: 0.5 ml    Route: IM    Given by: Lanier Prude, CMA(AAMA)    Exp. Date: 08/04/2011    Lot #: [id number]    VIS given: 05/20/08 version given June 23, 2010.   Immunizations Administered:  Tetanus Vaccine:    Vaccine Type: Td    Site: right deltoid    Mfr: Sanofi Pasteur    Dose: 0.5 ml    Route: IM    Given by: Lanier Prude, CMA(AAMA)    Exp. Date: 08/04/2011    Lot #:  [id number]    VIS given: 05/20/08 version given June 23, 2010.

## 2010-08-04 NOTE — Progress Notes (Signed)
Summary: Hydroco-Acetamin  Phone Note From Pharmacy   Caller: [facility]  [address] [phone]* Summary of Call: rec fax from pharm req rf on Hydroco-Acetamin. I spoke to pharmacist and he states on 05-19-10 no additional Rf were called in. So I gave verbal to have this time (# 120) plus one additional Rf based on the 05-19-10 phone note. Initial call taken by: Lanier Prude, [facility]),  June 14, 2010 11:00 AM  Follow-up for Phone Call        ok Follow-up by: Tresa Garter MD,  June 14, 2010 4:13 PM

## 2010-08-19 ENCOUNTER — Telehealth: Payer: Self-pay | Admitting: Internal Medicine

## 2010-08-24 NOTE — Progress Notes (Signed)
Summary: refill - Plot pt  Phone Note Call from Patient Call back at [facility] Phone [phone]   Caller: Patient Call For: Dr Posey Rea Summary of Call: Pt requests hydrocodone refill -- pt wil [facility] sunday[facility] [address] Initial call taken by: Cheryl Drake,  August 19, 2010 3:53 PM  Follow-up for Phone Call        ok to refill x 2 Follow-up by: Michael E Norins MD,  August 19, 2010 5:55 PM    Prescriptions: HYDROCODONE-ACETAMINOPHEN 7.5-325 MG TABS (HYDROCODONE-ACETAMINOPHEN) 1 by mouth qid as needed pain  #120 x 2   Entered by:   Sarah Douglas, CMA   Authorized by:   Michael E Norins MD   Signed by:   Sarah Douglas, CMA on 08/19/2010   Method used:   [facility]phoned to ...       [facility]  [address] [id number]       [address]       [place], [place]  [address]       Ph: [phone] or [phone]       Fax: [phone]   RxID:   [id number]

## 2010-09-22 ENCOUNTER — Ambulatory Visit (INDEPENDENT_AMBULATORY_CARE_PROVIDER_SITE_OTHER): Payer: Medicare Other | Admitting: Internal Medicine

## 2010-09-22 ENCOUNTER — Encounter: Payer: Self-pay | Admitting: Internal Medicine

## 2010-09-22 DIAGNOSIS — F329 Major depressive disorder, single episode, unspecified: Secondary | ICD-10-CM

## 2010-09-22 DIAGNOSIS — G6189 Other inflammatory polyneuropathies: Secondary | ICD-10-CM

## 2010-09-22 DIAGNOSIS — M545 Low back pain, unspecified: Secondary | ICD-10-CM

## 2010-09-22 DIAGNOSIS — G2581 Restless legs syndrome: Secondary | ICD-10-CM

## 2010-09-22 DIAGNOSIS — G622 Polyneuropathy due to other toxic agents: Secondary | ICD-10-CM

## 2010-09-22 DIAGNOSIS — F3289 Other specified depressive episodes: Secondary | ICD-10-CM

## 2010-09-22 MED ORDER — HYDROCODONE-ACETAMINOPHEN 7.5-325 MG PO TABS: 1.0000 | ORAL_TABLET | Freq: Four times a day (QID) | ORAL | Status: DC | PRN

## 2010-09-22 NOTE — Progress Notes (Signed)
  Subjective:    Patient ID: Justin [name], male    DOB: [birth year], 70 y.o.   MRN: [id number]  HPI The patient presents for a follow-up visit to check on  LBP, neuropathy, depression    Review of Systems  Constitutional: Negative for appetite change, fatigue and unexpected weight change.  HENT: Negative for nosebleeds, congestion, sore throat, sneezing, trouble swallowing and neck pain.   Eyes: Negative for itching and visual disturbance.  Respiratory: Negative for cough.   Cardiovascular: Negative for chest pain, palpitations and leg swelling.  Gastrointestinal: Negative for nausea, diarrhea, blood in stool and abdominal distention.  Genitourinary: Negative for frequency and hematuria.  Musculoskeletal: Positive for back pain and arthralgias. Negative for joint swelling and gait problem.  Skin: Negative for rash.  Neurological: Negative for dizziness, tremors, speech difficulty and weakness.  Psychiatric/Behavioral: Negative for sleep disturbance, dysphoric mood and agitation. The patient is not nervous/anxious.        Low appetite       Objective:   Physical Exam  Constitutional: He is oriented to person, place, and time. He appears well-developed.  HENT:  Mouth/Throat: Oropharynx is clear and moist.  Eyes: Conjunctivae are normal. Pupils are equal, round, and reactive to light.  Neck: Normal range of motion. No JVD present. No thyromegaly present.  Cardiovascular: Normal rate, regular rhythm, normal heart sounds and intact distal pulses.  Exam reveals no gallop and no friction rub.   No murmur heard. Pulmonary/Chest: Effort normal and breath sounds normal. No respiratory distress. He has no wheezes. He has no rales. He exhibits no tenderness.  Abdominal: Soft. Bowel sounds are normal. He exhibits no distension and no mass. There is no tenderness. There is no rebound and no guarding.  Musculoskeletal: He exhibits no edema and no tenderness.       L hand with post-op  deformity  Lymphadenopathy:    He has no cervical adenopathy.  Neurological: He is alert and oriented to person, place, and time. He has normal reflexes. No cranial nerve deficit. He exhibits normal muscle tone. Coordination normal.  Skin: Skin is warm and dry. No rash noted.  Psychiatric: He has a normal mood and affect. His behavior is normal. Judgment and thought content normal.          Assessment & Plan:  DEPRESSION Doing fair without meds  RESTLESS LEG SYNDROME On meds  NEUROPATHY, OTHER INFLAMMATORY AND TOXIC On meds  LOW BACK PAIN Unchanged - cont with current Rx

## 2010-09-22 NOTE — Assessment & Plan Note (Signed)
On meds

## 2010-09-22 NOTE — Assessment & Plan Note (Signed)
Doing fair without meds

## 2010-09-22 NOTE — Assessment & Plan Note (Signed)
Unchanged - cont with current Rx

## 2010-11-15 NOTE — Consult Note (Signed)
NAME:  Justin [name], Justin [name]            ACCOUNT NO.:  [id number]   MEDICAL RECORD NO.:  [id number]          PATIENT TYPE:  INP   LOCATION:  5120                         FACILITY:  [facility]   PHYSICIAN:  Doralee Albino. Carola Frost, M.D. DATE OF BIRTH:  [birth year]   DATE OF CONSULTATION:  12/20/2007  DATE OF DISCHARGE:                                 CONSULTATION   REASON FOR CONSULTATION:  Severely comminuted left distal humerus  fracture.   REQUESTING PHYSICIAN:  Harvie Junior, MD.   PRIMARY CARE PHYSICIAN:  Lovenia Kim, DO., of Internal Medicine.   HISTORY OF PRESENT ILLNESS:  Justin [name] is a very pleasant 70 year old  Caucasian male who presented to the emergency room on December 19, 2007,  after sustaining a fall as he was preparing to retire for the night.  The patient states that he was doing into the kitchen, he turned on his  dishwasher and apparently slipped and fell onto a large butcher's rock  resulting in trauma to his left face, arm, and rib cage.  The patient  does not recall much of the incident other than he fell and had  immediate onset of left upper extremity pain.  The patient states that  he did not had a sensation of blacking out or passing out prior to the  accident.  However, he is unable to recall specific mechanism and he  states there were no objects in the kitchen that would have caused him  to fall.  After the fall, the patient had immediate onset of left arm  pain and also sustained a wound to his left elbow region resulting in an  open fracture to his left distal humerus.  The patient was brought to  the emergency room for evaluation and treatment.  The patient was seen  in the emergency room and was evaluated by Dr. Luiz Blare, who determined  that he did indeed have a open fracture of his left distal humerus and  was brought to the operating room for irrigation and debridement of his  wound.  They also attempted closed reduction with placement of the  posterior  splint after irrigation and debridement.   After operative treatment, the patient underwent a CT scan to further  delineate the fracture pattern and severity.  Given the extreme  comminution of the fracture, Dr. Luiz Blare contacted Dr. Carola Frost in the  Orthopedic Trauma Service to consult on furthre care for this patient,  with possible assumption of management.   On clinical encounter today in the hospital, Justin [name] is doing  fairly well.  He states that he is in some pain, but it is well-  controlled with IV pain medication.  He does not on any dizziness,  lightheadedness, chest pain, or shortness of breath at this point in  time.  He denies any numbness or tingling into his left upper extremity.  His pain is approximately 4/10 right now and pretty well controlled.  It  is exacerbated with movement and is fairly comfortable when he is at  rest.  Justin [name] is also tolerating his splint well but does  complaint of some  discomfort along the wrist where the splint contacts  the distal aspect of his forearm.  Per the patient, he is ready to have  this surgical procedure to fix the fracture and put this whole incident  behind him.  The patient does occasionally complaint of intermittent  tinnitus as well as occasional dizziness.  The patient describes his  dizziness as having the room spin.   Family history is significant for lung cancer.   Past medical history is significant for osteoarthritis, depression, and  melanoma.   SURGICAL HISTORY:  Status post melanoma excision x3.  The patient is  also scheduled to have a basal cell carcinoma removed from his right  chest.  He was actually scheduled today, but this has obviously been  delayed.   ALLERGIES:  No known drug allergies.   SOCIAL HISTORY:  The patient was a former smoker and free of tobacco  products for 20 years.  The patient also abstained from alcohol use for  about 13 years.  Justin [name] is a former [employer] in  [employer]  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  The patient is married and is accompanied by his  wife.   PHYSICAL EXAMINATION:  VITAL SIGNS:  Temperature 98.1, pulse 82,  respirations 18, BP 139/87, and O2 sat 94% on room air.  The patient  does not have Foley and has adequate I's and O's.  GENERAL:  Justin [name] is a very pleasant, well-appearing 70 year old  male who appears of stated age.  He is rested comfortably in his bed and  is accompanied by his wife and is in no acute distress.  HEENT:  Head is normocephalic.  There is positive ecchymosis around the  left orbit and forehead.  Extraocular muscles are intact.  Moist mucous  membranes.  NECK:  Supple.  No lymphadenopathy is appreciated.  The patient  demonstrates full active cervical spine range of motion.  LUNGS:  Clear to auscultation bilaterally.  CARDIAC:  Regular rate rhythm.  ABDOMEN:  Soft, nontender, nondistended.  CHEST:  Positive ecchymosis along the left thoracic region.  Right upper extremity, right lower extremity, and left lower extremity  are atraumatic.  No gross deformities are noted.  Soft tissues are  nontender to palpation and are soft.  No crepitus or blocked motion is  appreciated.  The patient demonstrates full range of motion all joints.  Sensation is intact on the right upper extremity with regards to radial,  median, ulnar nerve, and axillary nerve.  Strength is also appropriate  along the radial, median, ulnar, anterior interosseus, and posterior  interosseus nerves in the right upper extremity.  Left and right lower  extremity sensation is intact along the deep peroneal nerve, superficial  peroneal nerve, and tibial nerves.  Motor function is symmetric and  intact with regards to extensor halluces longus, flexor longus, anterior  tibialis, posterior tibialis, peroneal tendons, and gastroc-soleus  complex.  With respect to the vascular status, Mr.  [name] demonstrates  2+ radial pulse on the right side as well as 2+ dorsalis pedis pulses  bilaterally.   With regards to the left upper extremity, he is currently in a long-arm  posterior splint in approximate 90 degrees of elbow flexion.  His arm is  also in sling.  I do appreciate significant edema in the distal left  upper extremity.  His shoulder is nontender to palpation without any  gross deformities.  Range of motion testing  is limited secondary to  fracture and pain in the elbow.  Elbow is not examined secondary to  being in a long-arm splint.  With regards to sensory function, he has  intact sensory function along the distal, median, radial, and ulnar  nerves.  The patient is able to demonstrate intact distal motor function  with regards to the median, ulnar, and radial nerves.  Also demonstrates  intact anterior interosseus nerve and posterior interosseus nerve and  motor function distally.  He has brisk capillary refill.  Extremity is  warm.  I do appreciate a radial pulse; however, it is somewhat beated  due to the dressing.   Labs are as follows; CBC:  White blood cells 11.6, hemoglobin 12.6,  hematocrit 37.0, platelets of 288, sodium 139, potassium 3.5, chloride  1.5, glucose 105, BUN 19, creatinine 1.2.   Plain film of the left elbow demonstrate a highly comminuted left distal  humerus fracture with intercondylar extension and distal humerus  fracture.  CT scan also demonstrates extensive comminuted intra-  articular fracture of the left distal humerus.  There appears to be  significant impaction along the coronoid fossa as well as distinct  trochlea and capitellar pieces.   ASSESSMENT AND PLAN:  This is an 70 year old Caucasian male status post  fall with a resultant left distal humerus fracture OTA classification 13  - C2.  At this time, we will proceed with definitive management which  would include open reduction internal fixation of the left distal  humerus.   Given the patient's age and anticipated quality of bone, he  would benefit greatly from open reduction and internal fixation versus a  total elbow arthroplasty.  The patient has been informed of the risks  and benefits of the surgery which include and are not limited to  infection, nonunion, vessel injury, nerve injury, and has elected to  proceed with the procedure.  We will also have Ancef 1 g IV on-call to  the OR.  The patient is to avoid on-call to the OR as well.  The patient  will be n.p.o.  We anticipate that after the procedure, the patient will  be nonweightbearing to the left upper extremity for the next 6-8 weeks.  He will also be in a posterior splint for approximately 2 weeks, at  which time, we will then begin very gentle active motion to help prevent  flexion contracture.   We thank Dr. Luiz Blare for this consultation and look forward to caring for  this patient.  Also given the unclear etiology of the patient's fall  once this acute issue is dealt with, we may want to refer the patient  back to his primary care doctor for a more adequate workup with regards  to these falling spells.  Given his description, the patient may be a  candidate for neurologic followup in the future.      Mearl Latin, PA      Doralee Albino. Carola Frost, M.D.  Electronically Signed    KWP/MEDQ  D:  12/20/2007  T:  12/20/2007  Job:  [id number]   cc:   Harvie Junior, M.D.  Lovenia Kim, D.O.

## 2010-11-15 NOTE — Discharge Summary (Signed)
NAME:  Justin [name], Justin [name]            ACCOUNT NO.:  [id number]   MEDICAL RECORD NO.:  [id number]          PATIENT TYPE:  INP   LOCATION:  3732                         FACILITY:  [facility]   PHYSICIAN:  Doralee Albino. Carola Frost, M.D. DATE OF BIRTH:  [birth year]   DATE OF ADMISSION:  12/19/2007  DATE OF DISCHARGE:  12/25/2007                               DISCHARGE SUMMARY   DISCHARGE DIAGNOSES:  1. Severely comminuted left distal intraarticular humerus fracture.  2. OTA classification 13 - C3.  3. Acute blood loss anemia.   ADDITIONAL DISCHARGE DIAGNOSES:  1. Osteoarthritis.  2. Depression.  3. Melanoma.   PROCEDURE PERFORMED:  On December 20, 2007, open reduction and internal  fixation of comminuted left distal humerus fracture with [employer] plating  system.   BRIEF HISTORY AND HOSPITAL COURSE:  Justin [name] is a very pleasant 70-  year-old Caucasian male who initially presented to the emergency room on  December 19, 2007, after sustaining a fall in his kitchen, which resulted in  an open left distal humerus fracture.  The patient was initially seen  and treated by Dr. Luiz Blare with irrigation and debridement of the open  wound with the application of a posterior long-arm splint.  Due to the  complexity of the fracture, Dr. Luiz Blare contacted Dr. Carola Frost in the  Orthopedic Trauma Service to consult the patient and to provide  definitive management.  The patient was seen and evaluated on December 20, 2007, and was found to be fit for surgery and was taken to the operating  room later that day.  The patient tolerated the patient procedure well.  Fixation was achieved and the patient was placed back into a posterior  long arm splint.  On postoperative day #1, the patient had well  controlled pain with IV pain medications through the use of a PCA.  He  did not demonstrate any deficits with regards to sensory or motor  function of his left hand.  On postoperative day #2, it was noted that  his H&H decreased to  approximately 8 and 25 respectively, but he was  asymptomatic, and it was decided that continued monitoring for his acute  blood loss anemia would be sufficient.  On postoperative day #2, the PCA  and Foley were both discontinued at the end of the day, and the patient  tolerated well.  The patient continued to work with occupational therapy  throughout his hospital stay and continued to make gains each day.  On  postoperative day #3, the patient was doing well with well controlled  pain, but did complain of getting medications at infrequent intervals  due to a p.r.n. nature.  I informed Justin [name] that he needs to be  vigilant as to the timing of his medications and ask for them in an  appropriate manner.  The patient agreed and followed in suit and did not  have any significant exacerbation of the pain.  Repeat H&H, although  ordered the day prior was not available upon clinical encounter on  postoperative day #3.  Therefore, another H&H was ordered later on that  day.  The repeat  H&H later in the day on postoperative day #3, on  12/23/2007, demonstrated H&H of 9.8 and 28.3.  Based on this  information, I did anticipate discharge to home with home health the  following day.  However, on repeat laboratory evaluation on 12/24/2007,  72.1 and 23.1 with some associated shortness of breath with  ambulation. hemoglobin and hematocrit had  decreased to 8.1 and 23.1 with some associated shortness of breath with  ambulation.  Therefore, indicated to me that he was symptomatic from the  standpoint of his acute blood loss anemia and proceeded with transfusion  of 2 units of packed red blood cells.  The patient did tolerate this  procedure well and on postoperative day #5, he had a hemoglobin and  hematocrit of 10.4 and 38.1 respectively with well-controlled pain and  feeling significantly better.  Therefore, on postoperative day #5, the  patient was stable from laboratory and physical exam perspective to  discharge him to home  with home health PT and occupational therapy.   In addition, the patient was seen by [facility] during his  hospital stay.  The rationale behind obtaining a consultation from the  family practice was two-pronged.  First, the patient's wife is a patient  at [facility], is a patient of Dr. Posey Rea and the patient  desired to change his PCP to Dr. Posey Rea as well.  Second reason for  consultation was surrounding the unclear etiology of the patient's  accident, which resulted in his left distal humerus fracture.  There is  some question as to whether or not the patient had a syncopal episode,  possible seizure, polypharmacy, or some neurologic etiology surrounding  his fall.  [facility] Team initiated workup for possible  vascular or cardiogenic causes.  A 2D echo of the patient's heart  demonstrated normal ejection fraction without any wall motion  abnormality.  A carotid duplex did demonstrate some internal carotid  stenosis bilaterally with the right having 40%-60% ICA stenosis and the  left with 60%-80% ICA stenosis.   DISCHARGE LABS:  Discharge hemoglobin and hematocrit 10.4 and 30.1  respectively.  Hemoglobin A1c normal at 5.7.  Thyroid panel is within  normal limits with regards to free T3, free T4, and TSH.  The patient's  lipid profile: His cholesterol is 98, triglycerides 51, HDLs are low at  28, LDL is 60, VLDL 10, and his cholesterol to HDL ratio is 3.5.  Clinical encounter note for postoperative day #5, the patient is doing  much better today and tolerating his transfusion well.  He did complain  of some numbness and tingling in his left arm yesterday evening, but got  significant relief with loosening of his ACE wrap, and the patient  states that is ready to be discharge today.  The patient is voiding well  and has no complains of shortness of breath or chest pain.   PHYSICAL EXAMINATION:  VITAL SIGNS:  Temperature 97.8, heart rate  87,  respirations 18, sat 95% on room air, and blood pressure 144/86.  GENERAL:  The patient is out of bed and ambulating upon encounter, is in  no acute distress.  LUNGS:  Clear to auscultation bilaterally.  CARDIAC:  Regular rate and rhythm.  ABDOMEN:  Soft, nontender, and nondistended with positive bowel sounds.  EXTREMITIES:  Left upper extremity, posterior splint is intact and the  ACE wrap is also intact.  There is a significant decrease in edema on  the left hand, but still appreciable.  Sensation along the  median,  radial, and ulnar nerves is intact to light touch.  Radial, median, and  ulnar nerve motor function is also intact.  Posterior interosseous and  anterior interosseous nerves motor function is also intact.  There is  brisk capillary refill.  The extremities are warm and appropriate color.   ASSESSMENT AND PLAN:  This is a 70 year old male status open reduction  and internal fixation with distal humerus fracture, OTA classification  13/C3.  1. Nonweightbearing left upper extremity with sling for comfort.  2. Home health PT/OT.  3. The patient did not require any formal deep venous thrombosis      prophylaxis.  4. Acute blood loss anemia, much improved and the patient currently      asymptomatic.  5. We will discharge the patient home today with associated home      health care.  The patient will follow with [facility]      in next 1-2 weeks.  He will also follow up in our office in 10 days      at which time we anticipate removal of splint, discontinuation of      staples, and obtaining PA and lateral x-rays of his left elbow to      evaluate fracture and hardware.   DISCHARGE MEDICATIONS:  1. Norco 5/325 1-2 p.o. q.6 h. as needed for pain.  2. Oxycodone 5 mg 1-2 p.o. q.6 h. between Norco doses for breakthrough      pain.   The patient may resume home medications as follows;  1. Cymbalta 30 mg 1 p.o. daily.  2. Travatan 1-2 drops each eye at bed  time.  3. Aspirin 81 mg 1 p.o. daily.  4. Vitamin D 1 p.o. daily and multivitamin 1 p.o. daily.  5. The patient is to discontinue the use of his Ambien as it is felt      that this may have contributed in part to his fall.  However, a      complete workup has not been completed yet.   DISCHARGE INSTRUCTIONS:  Justin [name] sustained a severely comminuted  fracture of his left distal humerus that was amenable to open reduction  and internal fixation.  He will continue to remain in the posterior long  arm splint for the next 2 weeks or so.  He is to be nonweightbearing to  his left upper extremity and anticipate this for the next 6-8 weeks or  so.  When Justin [name] returns to the office in approximately 10 days,  we will remove the posterior long arm splint and then obtain AP and  lateral views of his left elbow to assess this fracture  healing and to  evaluate hardware replacement as well as any migration of fracture  fragments.  Justin [name] can continue to use his sling for comfort and  should continue to use ice and elevation for pain and swelling control.  With regards to wound care instruction, there is no formal wound care  instruction that this point.  However, he is to follow splint care  instructions which were needed to keep the splint clean, dry, and  intact.  The splint is plastered and will breakdown if it gets wet.  I  have informed Justin [name] of these general splint care instructions,  and he acknowledges and understands all directions.   The patient will have home health physical therapy and occupational  therapy of his left hand and wrist.  They should focus on gentle,  passive,  and active range of motion for edema control.  Anticipating for  a short period of time, his therapy would be fairly limited and it may  be more feasible to set up a simple home exercise program until range of  motion is started at his left elbow.  I do anticipate that at  approximately  postoperative day #14.  After removal of the splint, we  will then begin some gentle motion of his left elbow in addition to  placing the patient in a hinged elbow brace.  The patient does not  require any formal DVT prophylaxis as this was an upper extremity injury  and the patient is fairly independent.  The patient should increase his  activity slowly and let pain dictate his activities as well.  The  patient will follow up with Dr. Carola Frost in 10 days.  He will call the  office at [phone] to schedule an appointment.  The patient should also  schedule an appointment with Dr. Posey Rea at [facility]  within the next 1-2 weeks to review labs obtained in the hospital as  well as to further evaluate possible etiologies for his fall, which  resulted in his admission this time.      Mearl Latin, PA      Doralee Albino. Carola Frost, M.D.  Electronically Signed    KWP/MEDQ  D:  12/25/2007  T:  12/26/2007  Job:  [id number]   cc:   Georgina Quint. Plotnikov, MD

## 2010-11-15 NOTE — Op Note (Signed)
NAME:  GEMINI, BEAUMIER            ACCOUNT NO.:  [id number]   MEDICAL RECORD NO.:  [id number]          PATIENT TYPE:  INP   LOCATION:  2550                         FACILITY:  [facility]   PHYSICIAN:  Harvie Junior, M.D.   DATE OF BIRTH:  1940-07-06   DATE OF PROCEDURE:  12/19/2007  DATE OF DISCHARGE:                               OPERATIVE REPORT   PREOPERATIVE DIAGNOSES:  Grade 1 open supracondylar and intercondylar  humerus fracture.   POSTOPERATIVE DIAGNOSES:  Grade 1 open supracondylar and intercondylar  humerus fracture.   PROCEDURE:  1. Irrigation and debridement of skin, fascia, muscle, and bone from      open fracture.  2. Manipulative closed reduction of supracondylar and intercondylar      humerus fracture.   SURGEON:  Harvie Junior, MD   ASSISTANT:  Marshia Ly, P.   ANESTHESIA:  General.   BRIEF HISTORY:  Mr. Stirewalt is a 70 year old male who was at home,  getting ready to open his dish washer and slipped fell onto his left  arm.  He presented to the emergency room with an open fracture,  consulted for his management.  At that time, he was noted to have  supracondylar and intercondylar humerus and severely comminuted fracture  with a grade 1 open wound.  At that point, we felt that we were going to  need some planning before we could undertake up reduction and fixation  of this and we needed to get him to the operating room for irrigation  and debridement.  He was brought to the operating room for this  procedure.   PROCEDURE:  The patient was brought to the operating room.  After  adequate anesthesia was obtained with general anesthetic the patient was  placed on the operating table.  The left arm was prepped and draped in  usual sterile fashion.  Following this, the wound was excised and  extended in both directions.  The bones were then all brought into the  wound, copiously and thoroughly irrigated, and suctioned dry.  Normal  saline of 6 liters irrigation  was instilled and made to wash out the  wounds.  Once this was completed, the wound was closed with a skin  stapler, and then underwent a manipulative closed reduction to try to  get a better alignment of the fracture fragments.  So that we took him  to CAT scan, we could get a better sense of what the injury was.  Once  this closed reduction was undertaken, a sterile compression dressing was  applied.  A long arm fiberglass cement was placed.  The patient was  taken to recovery room and was noted to be in satisfactory condition.   ESTIMATED BLOOD LOSS:  None.     Harvie Junior, M.D.  Electronically Signed    JLG/MEDQ  D:  12/19/2007  T:  12/19/2007  Job:  [id number]

## 2010-11-18 NOTE — Consult Note (Signed)
NAMEGAETAN, [name]            ACCOUNT NO.:  [id number]   MEDICAL RECORD NO.:  [id number]          PATIENT TYPE:  INP   LOCATION:  6732                         FACILITY:  [facility]   PHYSICIAN:  Anselmo Rod, M.D.  DATE OF BIRTH:  Nov 29, 1940   DATE OF CONSULTATION:  05/18/2005  DATE OF DISCHARGE:                                   CONSULTATION   REASON FOR CONSULTATION:  Rectal bleeding.   ASSESSMENT:  1.  Lower gastrointestinal bleed,rule out diverticular bleed versus colonic      polyps, arteriovenous malformations versus gastric ulcers.  2.  History of gastric ulcers 25 years ago treated medically.  3.  History of arthritis on Celebrex, aspirin, diclofenac on a regular      basis.  4.  Insomnia on Ambien.  5.  A 60-pack-year history of smoking, quit 20 years ago.  6.  History of alcohol abuse, quit 10 years ago.  7.  History of transient ischemic attack in the remote past.   RECOMMENDATIONS:  1.  Colonoscopy, plan possible EGD today by Dr. Elnoria Howard.  2.  Will start the prep now.  3.  Agree with serial CBCs.  4.  Discontinue all nonsteroidals including aspirin and diclofenac.  5.  Agree with Protonix.  6.  Transfuse as needed to keep hematocrit above 25%.   DISCUSSION:  [name] is a 70 year old white male who was  finishing up supper last night about 6 p.m. when he felt some abdominal  cramping and had a large bloody bowel movement.  He has had several blood  bowel movements since then.  He denies abdominal pain except for mild  cramping before a bowel movement.  There is no history of fevers, chills or  rigors.  Appetite has been good and weight has been stable.  History of  ulcer disease in the remote past but not recently.  There is no nausea,  vomiting, dysphagia or odynophagia.  He has regular bowel movements and  denies problems with constipation.  His appetite is good.  There is no  history of melenic stools.  He denies any genitourinary or  cardiorespiratory  complaints at this time. He has occasional dizzy spells and has history of  TIA in the remote past.   ALLERGIES:  NO KNOWN DRUG ALLERGIES.   HOME MEDICATIONS:  1.  Celebrex 200 mg daily.  2.  Ambien 10 mg p.o. nightly.  3.  Diclofenac 50 mg daily.  4.  Aspirin 325 mg daily.   PAST MEDICAL HISTORY:  See list above.   SOCIAL HISTORY:  He is retired and [place] 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  He is now  retired.  He has two grown children who are healthy.  He use to drink  heavily and smoke two to three packs per day until he quit smoking 20 years  ago and  stopped drinking  10 years ago.  He denies use of illicit drugs.   FAMILY HISTORY:  Father died at 64 of lung cancer.  His father was a smoker.  His mother died at 9 of bone cancer.  There were lesions in the lung as  well but the except type of the primary is not clear.  There is no known  family history of lung cancer.   PHYSICAL EXAMINATION:  GENERAL APPEARANCE:  Very pleasant, cooperative white  male in no acute distress laying comfortably in bed.  VITAL SIGNS:  Stable.  Temperature 97.6, blood pressure 141/87, pulse 70 per  minute, respiratory rate 18, weight 185.7 pounds.  HEENT:  Normocephalic and atraumatic.  PERRLA.  Facial symmetry preserved.  Oropharyngeal mucosa without exudates.  NECK:  Supple.  No JVD, thyromegaly, lymphadenopathy.  CHEST:  Clear to auscultation. No wheezing, rhonchi or rales.  CARDIOVASCULAR:  S1 and S2 regular, no murmurs, rubs, or gallops.  ABDOMEN:  Soft with normal bowel sounds.  No hepatosplenomegaly appreciated.  RECTAL:  Digital examination was not done as the patient had one done at the  [facility] ER yesterday.   ADMISSION LABORATORY DATA:  Hemoglobin  12.1 with white count of 8.1,  hematocrit 34.8, platelets 334, 68% neutrophils. Protime 13.7, INR 1.  Sodium 140, potassium 4.1, chloride 108, CO2 25, glucose 94, BUN 16,  creatinine 1, total bilirubin 0.6, alkaline phosphatase 64, AST 18, ALT 9,  total protein 6.2, albumin 3.5, calcium 8.8.  Hemoglobin today is down to  10.1 with MCV of 84.2, platelets 304.  Electrolytes are within normal  limits.  BUN is 14, calcium 8.4.   PLAN:  As above.  Further recommendations will be made in follow-up.      Anselmo Rod, M.D.  Electronically Signed     JNM/MEDQ  D:  05/18/2005  T:  05/18/2005  Job:  [id number]   cc:   Lovenia Kim, D.O.  Fax: [phone]

## 2010-11-18 NOTE — H&P (Signed)
NAMEJAHEEM, HEDGEPATH            ACCOUNT NO.:  [id number]   MEDICAL RECORD NO.:  [id number]          PATIENT TYPE:  EMS   LOCATION:  ED                           FACILITY:  [facility]   PHYSICIAN:  Hettie Holstein, D.O.    DATE OF BIRTH:  29-Mar-1941   DATE OF ADMISSION:  05/17/2005  DATE OF DISCHARGE:                                HISTORY & PHYSICAL   PRIMARY CARE PHYSICIAN:  Lovenia Kim, D.O.   CHIEF COMPLAINT:  Blood in stools.   HISTORY OF PRESENT ILLNESS:  Mr. Scala is a pleasant 70 year old  Caucasian male with past medical history only significant for TIAs in the  past as well as transient episodes of dizziness which he has been told was  probably Meniere's, though he is not certain of this. After having dinner at  a restaurant today, he experienced an episode of abdominal cramping and had  dark bloody stools x1 at the restaurant. He drove home and again had a  couple of episodes at home. Subsequently, he presented to the emergency  department and had a couple episodes here as well of dark, red, bloody  diarrhea mixed with stool. In any event, he was found to be hemodynamically  stable in the emergency department. His hemoglobin at the time was 12.1. No  prior for comparison. His BUN was 16. Otherwise, all laboratory data was  normal.   PAST MEDICAL HISTORY:  As noted above. He has undergone colonoscopy about  five years. He stated that the findings were unremarkable. He had a history  of TIA about five years ago and since has been on aspirin. He has possible  Meniere's. He has vertiginous symptoms intermittently as well as arthritis.  Denies any previous history of surgery.   MEDICATIONS:  1.  He takes aspirin 325 milligrams daily.  2.  Ambien 10 milligrams q.h.s.  3.  He takes Celebrex every other day.  4.  In addition, he takes another arthritis medicine prescribed to him by      the [facility] He does not know the name or the dose.   ALLERGIES:  He has no known drug  allergies.   SOCIAL HISTORY:  He quit smoking tobacco about 20 years ago. Quit drinking  alcohol about 10 years ago. He has two children. He was formerly a Production designer, theatre/television/film  of a convenient store for 11 years. Currently married.   FAMILY HISTORY:  His mother died at age 97 with what sounds like leukemia.  His father died at age 18 with lung cancer.   REVIEW OF SYSTEMS:  His weight has been stable. Appetite stable. No nausea,  chest pain, or shortness of breath. He has had a stress test within the past  year which he states was unremarkable. He has no lower extremity swelling or  edema. No shortness of breath, no PND.   REVIEW OF SYSTEMS:  Unremarkable.   PHYSICAL EXAMINATION:  VITAL SIGNS:  His vital signs in the emergency  department were stable. Heart rate 63, blood pressure 141/88. He was  afebrile with a temperature 97.6, respirations 18, O2 saturation 97%.  GENERAL:  The patient is alert. He is in no acute distress.  HEENT:  He exhibits no conjunctival pallor. His head is normocephalic and  atraumatic. Extraocular movements intact.  NECK:  Supple and nontender. No palpable thyromegaly or mass.  CARDIOVASCULAR:  Reveals normal S1 and S2 without appreciable murmur.  LUNGS:  Clear to auscultation. He exhibits normal effort. There is no  dullness to percussion.  ABDOMEN:  Soft. Minimally tender diffusely. There are no focal areas. Bowel  sounds are normal to hyperactive. He has no suprapubic or costovertebral  angle tenderness.  NEUROLOGICAL:  Reveals the patient to be euthymic. His affect is stable. He  moves all four extremities spontaneously. His peripheral pulses are  symmetrical and palpable bilaterally.   LABORATORY DATA:  His WBC is 8.1, hemoglobin 12.1, platelet count 334,000,  MCV 84. Albumin 3.5. His BUN is 16, creatinine 1.0. Sodium 140, potassium  4.1, INR 1.0, and glucose 94.   ASSESSMENT:  1.  Gastrointestinal bleed:  Suspect lower.  2.  History of transient ischemic  attacks in the past.   PLAN:  We are going to admit Mr. Kanzler on telemetry floor. Check his  hemoglobin q.8h. Follow his clinical course. Administer gentle IV hydration.  Assure a large bore IV access and transfuse if required. I have contacted  Dr. Anselmo Rod on call for gastroenterology in anticipation of  endoscopy. She has requested that he remain on clear liquids until she sees  him in the morning.      Hettie Holstein, D.O.  Electronically Signed     ESS/MEDQ  D:  05/17/2005  T:  05/17/2005  Job:  [id number]   cc:   Lovenia Kim, D.O.  Fax: [phone]   [id number] NWG NFAO, M.D.  Fax: [phone]

## 2010-11-18 NOTE — Discharge Summary (Signed)
NAMEDIETRICH, Justin            ACCOUNT NO.:  [id number]   MEDICAL RECORD NO.:  [id number]          PATIENT TYPE:  INP   LOCATION:  6732                         FACILITY:  [facility]   PHYSICIAN:  Isidor Holts, M.D.  DATE OF BIRTH:  [birth year]   DATE OF ADMISSION:  05/18/2005  DATE OF DISCHARGE:  05/19/2005                                 DISCHARGE SUMMARY   DISCHARGE DIAGNOSES:  1.  Hematochezia, secondary to diverticular bleed.  2.  Extensive diverticulosis confirmed by colonoscopy, May 18, 2005.  3.  History of transient ischemic attacks.  4.  Osteoarthritis.  5.  History of Meniere's syndrome.   DISCHARGE MEDICATIONS:  1.  Ambien 10 mg p.o. p.r.n. q.h.s.  2.  Aspirin 325 mg p.o. daily.  3.  Tylenol extra-strength 1 g p.o. p.r.n. q.6h. for arthritis over-the-      counter.  4.  Metamucil daily (over-the-counter) for fiber.  5.  Avoid NSAIDs until seen by primary M.D.   PROCEDURES:  1.  Lower gastrointestinal endoscopy May 18, 2005 by Dr. Jeani Hawking.      This showed no active bleeding, extensive diverticulosis throughout the      colon, highest concentration in the descending and sigmoid colon, also      internal/external hemorrhoids.  2.  Upper gastrointestinal endoscopy May 18, 2005 by Dr. Jeani Hawking.      This was unremarkable.   CONSULTS:  1.  Dr. Charna Elizabeth, gastroenterologist  2.  Dr. Jeani Hawking, gastroenterologist   ADMISSION HISTORY:  As in H&P note of May 18, 2005.  However, in brief,  this is a 70 year old male, with known history of previous TIAs,  osteoarthritis, Meniere's syndrome who utilizes Celebrex and diclofenac for  his osteoarthritis.  He presents with abdominal cramping followed by bloody  bowel movement after supper on May 18, 2005.  No history of previous  similar episodes.  No hematemesis.  He was admitted for further evaluation,  investigation, and management.   CLINICAL COURSE:  #1 - LOWER GASTROINTESTINAL  BLEED/HEMATOCHEZIA:  This is a  patient, who was on regular NSAID and COX-2 inhibitor therapy for  osteoarthritis, presenting with several episodes of hematochezia.  He  remained, however, hemodynamically stable and hemoglobin measured serially,  remained stable.  He was commenced on intravenous fluids, kept n.p.o.,  started on proton-pump inhibitor treatment.  GI consultation was called,  which was kindly provided by Dr. Charna Elizabeth who concluded that patient  would need both upper and lower GI endoscopies.  Patient underwent this  procedure on May 18, 2005 by Dr. Jeani Hawking.  Upper GI endoscopy was  absolutely unremarkable.  Lower GI endoscopy revealed several diverticula  but no active bleeding.  There were also internal and external hemorrhoids.  Patient remained clinically stable.  As of May 19, 2005 he had no  further episodes of hematochezia.  His hemoglobin levels remained stable  and, as a matter of fact, on May 19, 2005 hemoglobin was 9.4.  Patient  is completely asymptomatic and per GI is deemed stable for discharge and  follow-up with primary physician.   #2 - HISTORY  OF TRANSIENT ISCHEMIC ATTACKS:  Patient continues on low dose  aspirin.  He had no episodes during the course of his hospital stay.   #3 - HISTORY OF MENIERE'S SYNDROME:  Patient remained asymptomatic from this  viewpoint, during  the course of his hospital stay.   #4 - HISTORY OF OSTEOARTHRITIS:  In view of recent lower GI bleed, patient  has been recommended to discontinue COX-2 inhibitor and NSAID therapy until  reevaluated by his PMD.  He may utilize Tylenol extra-strength for now, for  control of his arthritis symptoms.   DISPOSITION:  Patient was discharged in satisfactory condition on May 19, 2005.   DIET:  Increased dietary fiber.   ACTIVITY:  As tolerated.   WOUND CARE:  Not applicable.   PAIN MANAGEMENT:  Not applicable.   FOLLOW-UP INSTRUCTIONS:  Patient is to follow  up with his primary care  physician, Dr. Marisue Brooklyn within one week.  He is to call to schedule an  appointment.   SPECIAL INSTRUCTIONS:  It is expected that patient's primary M.D. will  follow up on patient's hemoglobin level, monitor his arthritis and adjust  his medications, should the need arise.  All this has been communicated to  patient.  He has verbalized understanding.      Isidor Holts, M.D.  Electronically Signed     CO/MEDQ  D:  05/19/2005  T:  05/19/2005  Job:  [id number]   cc:   Lovenia Kim, D.O.  Fax: [phone]   WGNFAO ZHY QMVH, M.D.  Fax: [phone]   Jordan Hawks. Elnoria Howard, MD  Fax: [phone]

## 2010-11-18 NOTE — Op Note (Signed)
NAME:  Justin [name]            ACCOUNT NO.:  [id number]   MEDICAL RECORD NO.:  [id number]          PATIENT TYPE:  INP   LOCATION:  5120                         FACILITY:  [facility]   PHYSICIAN:  Doralee Albino. Carola Frost, M.D. DATE OF BIRTH:  07/15/1940   DATE OF PROCEDURE:  03/21/2008  DATE OF DISCHARGE:                               OPERATIVE REPORT   PREOPERATIVE DIAGNOSIS:  Irrigation and debridement of open left T  intercondylar and supracondylar humerus fracture.   POSTOPERATIVE DIAGNOSIS:  Irrigation and debridement of open left T  intercondylar and supracondylar humerus fracture.   PROCEDURE:  1. Open reduction and internal fixation of left T-type intercondylar      and supracondylar humerus fracture.  2. Irrigation and debridement of open fracture including debridement      of devitalized bone.   SURGEON:  Doralee Albino. Carola Frost, MD   ASSISTANT:  Mearl Latin, PA   ANESTHESIA:  General.   COMPLICATIONS:  None.   ESTIMATED BLOOD LOSS:  100 mL.   TOTAL TOURNIQUET TIME:  29 minutes.   DISPOSITION:  To PACU.   CONDITION:  Stable.   BRIEF SUMMARY AND INDICATIONS FOR PROCEDURE:  Justin [name] is a  right-hand dominant white male who sustained a ground-level fall  resulting in severe left elbow injury.  He was initially seen and  evaluated by Dr. Luiz Blare who promptly irrigated his fracture in the  operating room and then closed his wound loosely.  Plan was to return to  the OR for definitive fixation.  Given the enormous complexity of this  fracture, he requested consultation from the Orthopedic Trauma Service.  We evaluated the patient and felt that he was a candidate for  reconstruction, though total elbow was an option.  We discussed it  thoroughly with the patient and he wished to proceed with osteosynthesis  given the need for long-term lifting restrictions with the total elbow  as well as the infection risk given this was an open fracture.  He  understood those risks  that included failure to prevent infection, nerve  injury, vessel injury, decreased range of motion, symptomatic hardware,  heart attack, stroke, and multiple others.   BRIEF DESCRIPTION OF PROCEDURE:  Justin [name] was taken to the operating  room where general anesthesia was induced.  His left upper extremity was  prepped and draped in usual sterile fashion after positioning on left  side up and padding all prominences appropriately.  We made a standard  posterior approach and performed olecranon osteotomy with an oscillating  saw after first identifying the ulnar nerve and performing a complete  ulnar nerve release and immobilization, so that we could protect it  throughout the procedure.  The triceps were reflected proximally  revealing a severely comminuted fracture with multiple free fragments  including those along the trochlear spool which did have segmental  comminution and was missing at least half of its circumference without  any soft tissue attachment.  We thoroughly irrigated the surrounding  soft tissues removing again the devitalized bone.   We then began reconstruction with provisional pin fixation as well as  lag screws  in the metaphysis.  This was followed by articular  reconstruction and plate application.  We checked our plate placement  provisionally and then continued with placement of screws.  We placed  bone graft into the containable defects, but did leave part of the  trochlea spool without grafting and simply missing some of its  substance.  We then repaired the olecranon osteotomy with K-wires  visually followed by hook plate to achieve compression both along the  dorsal cortex and more anteriorly along the articular surface.  We  obtained final AP and lateral images which showed appropriate reduction,  hardware placement, and length.  We irrigated thoroughly and then closed  in standard layered fashion with 0 Vicryl, 2-0 Vicryl, and staples for  the skin.   We did reapproximate the soft tissues underneath the ulna  over the plate to prevent any direct contact of the ulna with the plate,  but this did allow the ulna to sit directly in the cubital groove  without any tethering or encumbrance.  Posterior and side splint was  then applied.  The patient was awakened from anesthesia and transported  to PACU in stable condition.  [name], PA, assisted throughout  the procedure with retraction during exposure, protection of the ulnar  and nerve mobilization and also with maintaining reduction and  facilitating instrumentation.  The assistance he provided was necessary  to completion of the case.   PROGNOSIS:  Justin [name] has had tremendous injury to his left humerus  and remains at very high risk for multiple complications including  decreased range of motion, arthritis, delayed union or nonunion as well  as the need for further surgery.  As soon as his wound is stable, we  will began a gentle protected range of motion which we anticipate being  in 10 days.  He will remain on perioperative antibiotics for another 48  hours and then these will be discontinued.      Doralee Albino. Carola Frost, M.D.  Electronically Signed     MHH/MEDQ  D:  12/20/2007  T:  12/21/2007  Job:  [id number]

## 2010-12-19 ENCOUNTER — Other Ambulatory Visit (INDEPENDENT_AMBULATORY_CARE_PROVIDER_SITE_OTHER): Payer: Medicare Other

## 2010-12-19 DIAGNOSIS — M545 Low back pain, unspecified: Secondary | ICD-10-CM

## 2010-12-19 DIAGNOSIS — Z79899 Other long term (current) drug therapy: Secondary | ICD-10-CM

## 2010-12-19 LAB — URINALYSIS
Bilirubin Urine: NEGATIVE
Hgb urine dipstick: NEGATIVE
Ketones, ur: NEGATIVE
Leukocytes, UA: NEGATIVE
Nitrite: NEGATIVE
Specific Gravity, Urine: 1.02 (ref 1.000–1.030)
Total Protein, Urine: NEGATIVE
Urine Glucose: NEGATIVE
Urobilinogen, UA: 0.2 (ref 0.0–1.0)
pH: 6 (ref 5.0–8.0)

## 2010-12-19 LAB — LIPID PANEL
Cholesterol: 146 mg/dL (ref 0–200)
HDL: 40.2 mg/dL (ref >39.00)
LDL Cholesterol: 91 mg/dL (ref 0–99)
Total CHOL/HDL Ratio: 4
Triglycerides: 76 mg/dL (ref 0.0–149.0)
VLDL: 15.2 mg/dL (ref 0.0–40.0)

## 2010-12-19 LAB — BASIC METABOLIC PANEL
BUN: 15 mg/dL (ref 6–23)
CO2: 27 mEq/L (ref 19–32)
Calcium: 8.9 mg/dL (ref 8.4–10.5)
Chloride: 103 mEq/L (ref 96–112)
Creatinine, Ser: 0.9 mg/dL (ref 0.4–1.5)
GFR: 84.98 mL/min (ref >60.00)
Glucose, Bld: 92 mg/dL (ref 70–99)
Potassium: 4.4 mEq/L (ref 3.5–5.1)
Sodium: 142 mEq/L (ref 135–145)

## 2010-12-19 LAB — HEPATIC FUNCTION PANEL
ALT: 13 U/L (ref 0–53)
AST: 19 U/L (ref 0–37)
Albumin: 4.2 g/dL (ref 3.5–5.2)
Alkaline Phosphatase: 48 U/L (ref 39–117)
Bilirubin, Direct: 0.1 mg/dL (ref 0.0–0.3)
Total Bilirubin: 0.5 mg/dL (ref 0.3–1.2)
Total Protein: 6.5 g/dL (ref 6.0–8.3)

## 2010-12-19 LAB — CBC WITH DIFFERENTIAL/PLATELET
Basophils Absolute: 0 10*3/uL (ref 0.0–0.1)
Basophils Relative: 0.4 % (ref 0.0–3.0)
Eosinophils Absolute: 0.1 10*3/uL (ref 0.0–0.7)
Eosinophils Relative: 1.8 % (ref 0.0–5.0)
HCT: 32.4 % — ABNORMAL LOW (ref 39.0–52.0)
Hemoglobin: 11.3 g/dL — ABNORMAL LOW (ref 13.0–17.0)
Lymphocytes Relative: 24 % (ref 12.0–46.0)
Lymphs Abs: 1.6 10*3/uL (ref 0.7–4.0)
MCHC: 34.8 g/dL (ref 30.0–36.0)
MCV: 88.2 fl (ref 78.0–100.0)
Monocytes Absolute: 0.5 10*3/uL (ref 0.1–1.0)
Monocytes Relative: 6.8 % (ref 3.0–12.0)
Neutro Abs: 4.4 10*3/uL (ref 1.4–7.7)
Neutrophils Relative %: 67 % (ref 43.0–77.0)
Platelets: 214 10*3/uL (ref 150.0–400.0)
RBC: 3.67 Mil/uL — ABNORMAL LOW (ref 4.22–5.81)
RDW: 13.5 % (ref 11.5–14.6)
WBC: 6.6 10*3/uL (ref 4.5–10.5)

## 2010-12-19 LAB — TSH: TSH: 2.7 u[IU]/mL (ref 0.35–5.50)

## 2010-12-23 ENCOUNTER — Ambulatory Visit (INDEPENDENT_AMBULATORY_CARE_PROVIDER_SITE_OTHER): Payer: Medicare Other | Admitting: Internal Medicine

## 2010-12-23 ENCOUNTER — Encounter: Payer: Self-pay | Admitting: Internal Medicine

## 2010-12-23 DIAGNOSIS — M545 Low back pain, unspecified: Secondary | ICD-10-CM

## 2010-12-23 DIAGNOSIS — N400 Enlarged prostate without lower urinary tract symptoms: Secondary | ICD-10-CM

## 2010-12-23 DIAGNOSIS — E538 Deficiency of other specified B group vitamins: Secondary | ICD-10-CM | POA: Insufficient documentation

## 2010-12-23 MED ORDER — TAMSULOSIN HCL 0.4 MG PO CAPS: 0.4000 mg | ORAL_CAPSULE | Freq: Every day | ORAL | Status: DC

## 2010-12-23 NOTE — Assessment & Plan Note (Signed)
Will check B12 level

## 2010-12-23 NOTE — Assessment & Plan Note (Signed)
On Rx 

## 2010-12-23 NOTE — Progress Notes (Signed)
  Subjective:    Patient ID: Justin [name], male    DOB: 11-01-1940, 70 y.o.   MRN: [id number]  HPI   The patient is here to follow up on chronic depression, anxiety, headaches and chronic moderate OA symptoms controlled with medicineC/o dribbling and using bathroom q 2 hrs  Review of Systems  Constitutional: Negative for fever, appetite change, fatigue and unexpected weight change.  HENT: Negative for nosebleeds, congestion, sore throat, sneezing, trouble swallowing and neck pain.   Eyes: Negative for itching and visual disturbance.  Respiratory: Negative for cough.   Cardiovascular: Negative for chest pain, palpitations and leg swelling.  Gastrointestinal: Negative for nausea, diarrhea, blood in stool and abdominal distention.  Genitourinary: Positive for urgency and frequency. Negative for hematuria and penile swelling.  Musculoskeletal: Positive for back pain and arthralgias. Negative for joint swelling and gait problem.  Skin: Negative for rash.  Neurological: Negative for dizziness, tremors, speech difficulty and weakness.  Psychiatric/Behavioral: Negative for sleep disturbance, dysphoric mood and agitation. The patient is not nervous/anxious.        Objective:   Physical Exam  Constitutional: He is oriented to person, place, and time. He appears well-developed.  HENT:  Mouth/Throat: Oropharynx is clear and moist.  Eyes: Conjunctivae are normal. Pupils are equal, round, and reactive to light.  Neck: Normal range of motion. No JVD present. No thyromegaly present.  Cardiovascular: Normal rate, regular rhythm, normal heart sounds and intact distal pulses.  Exam reveals no gallop and no friction rub.   No murmur heard. Pulmonary/Chest: Effort normal and breath sounds normal. No respiratory distress. He has no wheezes. He has no rales. He exhibits no tenderness.  Abdominal: Soft. Bowel sounds are normal. He exhibits no distension and no mass. There is no tenderness. There is no  rebound and no guarding.  Genitourinary: Guaiac negative stool.       Enlarged prostate No mass  Musculoskeletal: Normal range of motion. He exhibits no edema and no tenderness.  Lymphadenopathy:    He has no cervical adenopathy.  Neurological: He is alert and oriented to person, place, and time. He has normal reflexes. No cranial nerve deficit. He exhibits normal muscle tone. Coordination normal.  Skin: Skin is warm and dry. No rash noted.  Psychiatric: He has a normal mood and affect. His behavior is normal. Judgment and thought content normal.        Prostate 1+ R>L, NT  Assessment & Plan:

## 2010-12-27 NOTE — Assessment & Plan Note (Signed)
See Meds 

## 2011-02-02 ENCOUNTER — Telehealth: Payer: Self-pay | Admitting: *Deleted

## 2011-02-02 DIAGNOSIS — M545 Low back pain, unspecified: Secondary | ICD-10-CM

## 2011-02-02 NOTE — Telephone Encounter (Signed)
FAX RF REQUEST for Hydrocodone 7.5 325 qid prn # 120. OK?

## 2011-02-03 MED ORDER — HYDROCODONE-ACETAMINOPHEN 7.5-325 MG PO TABS: 1.0000 | ORAL_TABLET | Freq: Four times a day (QID) | ORAL | Status: DC | PRN

## 2011-02-03 NOTE — Telephone Encounter (Signed)
okey dokey with 1 add'l refill

## 2011-02-03 NOTE — Telephone Encounter (Signed)
DONE

## 2011-03-27 ENCOUNTER — Telehealth: Payer: Self-pay | Admitting: *Deleted

## 2011-03-27 DIAGNOSIS — M545 Low back pain, unspecified: Secondary | ICD-10-CM

## 2011-03-27 NOTE — Telephone Encounter (Signed)
OK to fill this prescription with additional refills x2 Thank you!  

## 2011-03-27 NOTE — Telephone Encounter (Signed)
Rf req for Hydroco/APAP 7.5-325mg  1 po qid prn # 120. Ok to rf?

## 2011-03-28 MED ORDER — HYDROCODONE-ACETAMINOPHEN 7.5-325 MG PO TABS: 1.0000 | ORAL_TABLET | Freq: Four times a day (QID) | ORAL | Status: DC | PRN

## 2011-03-30 ENCOUNTER — Ambulatory Visit (INDEPENDENT_AMBULATORY_CARE_PROVIDER_SITE_OTHER): Payer: Medicare Other | Admitting: Internal Medicine

## 2011-03-30 ENCOUNTER — Encounter: Payer: Self-pay | Admitting: Internal Medicine

## 2011-03-30 VITALS — BP 130/84 | HR 80 | Temp 97.8°F | Resp 16 | Ht 72.0 in | Wt 177.0 lb

## 2011-03-30 DIAGNOSIS — Z23 Encounter for immunization: Secondary | ICD-10-CM

## 2011-03-30 DIAGNOSIS — E538 Deficiency of other specified B group vitamins: Secondary | ICD-10-CM

## 2011-03-30 DIAGNOSIS — F3289 Other specified depressive episodes: Secondary | ICD-10-CM

## 2011-03-30 DIAGNOSIS — M545 Low back pain, unspecified: Secondary | ICD-10-CM

## 2011-03-30 DIAGNOSIS — F329 Major depressive disorder, single episode, unspecified: Secondary | ICD-10-CM

## 2011-03-30 DIAGNOSIS — E559 Vitamin D deficiency, unspecified: Secondary | ICD-10-CM

## 2011-03-30 DIAGNOSIS — N400 Enlarged prostate without lower urinary tract symptoms: Secondary | ICD-10-CM

## 2011-03-30 LAB — DIFFERENTIAL
Basophils Absolute: 0
Basophils Absolute: 0.1
Basophils Relative: 0
Basophils Relative: 1
Eosinophils Absolute: 0.1
Eosinophils Absolute: 0.2
Eosinophils Relative: 2
Eosinophils Relative: 2
Lymphocytes Relative: 10 — ABNORMAL LOW
Lymphocytes Relative: 20
Lymphs Abs: 0.8
Lymphs Abs: 2.3
Monocytes Absolute: 0.6
Monocytes Absolute: 1
Monocytes Relative: 11
Monocytes Relative: 5
Neutro Abs: 6.7
Neutro Abs: 8.5 — ABNORMAL HIGH
Neutrophils Relative %: 73
Neutrophils Relative %: 77

## 2011-03-30 LAB — BASIC METABOLIC PANEL
BUN: 8
BUN: 9
CO2: 29
CO2: 30
Calcium: 8.3 — ABNORMAL LOW
Calcium: 8.7
Chloride: 101
Chloride: 101
Creatinine, Ser: 0.98
Creatinine, Ser: 1.05
GFR calc Af Amer: >60
GFR calc Af Amer: >60
GFR calc non Af Amer: >60
GFR calc non Af Amer: >60
Glucose, Bld: 117 — ABNORMAL HIGH
Glucose, Bld: 128 — ABNORMAL HIGH
Potassium: 3.6
Potassium: 4.2
Sodium: 134 — ABNORMAL LOW
Sodium: 138

## 2011-03-30 LAB — RENAL FUNCTION PANEL
Albumin: 3 — ABNORMAL LOW
BUN: 6
CO2: 28
Calcium: 9
Chloride: 102
Creatinine, Ser: 0.95
GFR calc Af Amer: >60
GFR calc non Af Amer: >60
Glucose, Bld: 116 — ABNORMAL HIGH
Phosphorus: 1.5 — ABNORMAL LOW
Potassium: 3.7
Sodium: 138

## 2011-03-30 LAB — TYPE AND SCREEN
ABO/RH(D): O NEG
Antibody Screen: NEGATIVE

## 2011-03-30 LAB — POCT I-STAT, CHEM 8
BUN: 19
Calcium, Ion: 1.16
Chloride: 105
Creatinine, Ser: 1.2
Glucose, Bld: 105 — ABNORMAL HIGH
HCT: 37 — ABNORMAL LOW
Hemoglobin: 12.6 — ABNORMAL LOW
Potassium: 3.5
Sodium: 139
TCO2: 24

## 2011-03-30 LAB — TSH: TSH: 2.912

## 2011-03-30 LAB — PREPARE RBC (CROSSMATCH)

## 2011-03-30 LAB — CBC
HCT: 23.1 — ABNORMAL LOW
HCT: 25.4 — ABNORMAL LOW
HCT: 28.3 — ABNORMAL LOW
HCT: 29 — ABNORMAL LOW
HCT: 36.4 — ABNORMAL LOW
Hemoglobin: 12.4 — ABNORMAL LOW
Hemoglobin: 8.1 — ABNORMAL LOW
Hemoglobin: 8.8 — ABNORMAL LOW
Hemoglobin: 9.8 — ABNORMAL LOW
Hemoglobin: 9.8 — ABNORMAL LOW
MCHC: 33.9
MCHC: 34
MCHC: 34.5
MCHC: 34.6
MCHC: 34.9
MCV: 87.5
MCV: 87.6
MCV: 87.7
MCV: 87.8
MCV: 87.9
Platelets: 198
Platelets: 218
Platelets: 237
Platelets: 253
Platelets: 288
RBC: 2.63 — ABNORMAL LOW
RBC: 2.89 — ABNORMAL LOW
RBC: 3.23 — ABNORMAL LOW
RBC: 3.3 — ABNORMAL LOW
RBC: 4.16 — ABNORMAL LOW
RDW: 12.7
RDW: 12.8
RDW: 12.9
RDW: 13
RDW: 13.2
WBC: 10.8 — ABNORMAL HIGH
WBC: 10.8 — ABNORMAL HIGH
WBC: 11.6 — ABNORMAL HIGH
WBC: 8
WBC: 8.8

## 2011-03-30 LAB — LIPID PANEL
Cholesterol: 98
HDL: 28 — ABNORMAL LOW
LDL Cholesterol: 60
Total CHOL/HDL Ratio: 3.5
Triglycerides: 51
VLDL: 10

## 2011-03-30 LAB — HEMOGLOBIN AND HEMATOCRIT, BLOOD
HCT: 29.7 — ABNORMAL LOW
HCT: 30.1 — ABNORMAL LOW
HCT: 30.6 — ABNORMAL LOW
Hemoglobin: 10.1 — ABNORMAL LOW
Hemoglobin: 10.4 — ABNORMAL LOW
Hemoglobin: 10.4 — ABNORMAL LOW

## 2011-03-30 LAB — VITAMIN D 1,25 DIHYDROXY: Vit D, 1,25-Dihydroxy: 44 pg/mL (ref 15–75)

## 2011-03-30 LAB — ABO/RH: ABO/RH(D): O NEG

## 2011-03-30 LAB — T4, FREE: Free T4: 1.26

## 2011-03-30 LAB — T3, FREE: T3, Free: 2.3 (ref 2.3–4.2)

## 2011-03-30 LAB — HEMOGLOBIN A1C
Hgb A1c MFr Bld: 5.7
Mean Plasma Glucose: 126

## 2011-03-30 NOTE — Assessment & Plan Note (Signed)
Check B12 

## 2011-03-30 NOTE — Assessment & Plan Note (Signed)
Continue with current prescription therapy as reflected on the Med list.  

## 2011-03-30 NOTE — Progress Notes (Signed)
  Subjective:    Patient ID: Justin [name], male    DOB: 14-Feb-1941, 70 y.o.   MRN: [id number]  HPI   The patient is here to follow up on chronic depression, anxiety, headaches and chronic moderate OA and fibromyalgia symptoms controlled partially with medicines, diet and exercise.   Review of Systems  Constitutional: Unexpected weight change: lost wt.  Gastrointestinal:       No appetite   Musculoskeletal: Positive for back pain, arthralgias and gait problem.  Psychiatric/Behavioral: Positive for sleep disturbance. Negative for suicidal ideas.       Objective:   Physical Exam  Constitutional: He is oriented to person, place, and time. He appears well-developed and well-nourished.  HENT:  Mouth/Throat: Oropharynx is clear and moist.  Eyes: Conjunctivae are normal. Pupils are equal, round, and reactive to light.  Neck: Normal range of motion. No JVD present. No thyromegaly present.  Cardiovascular: Normal rate, regular rhythm, normal heart sounds and intact distal pulses.  Exam reveals no gallop and no friction rub.   No murmur heard. Pulmonary/Chest: Effort normal and breath sounds normal. No respiratory distress. He has no wheezes. He has no rales. He exhibits no tenderness.  Abdominal: Soft. Bowel sounds are normal. He exhibits no distension and no mass. There is no tenderness. There is no rebound and no guarding.  Musculoskeletal: Normal range of motion. He exhibits tenderness. He exhibits no edema.  Lymphadenopathy:    He has no cervical adenopathy.  Neurological: He is alert and oriented to person, place, and time. He has normal reflexes. No cranial nerve deficit. He exhibits normal muscle tone. Coordination normal.  Skin: Skin is warm and dry. No rash noted.  Psychiatric: He has a normal mood and affect. His behavior is normal. Judgment and thought content normal.          Assessment & Plan:

## 2011-06-14 ENCOUNTER — Telehealth: Payer: Self-pay | Admitting: *Deleted

## 2011-06-14 DIAGNOSIS — M545 Low back pain, unspecified: Secondary | ICD-10-CM

## 2011-06-14 NOTE — Telephone Encounter (Signed)
Rf req for Norco 7.5/325 1 po qid prn. Ok to Rf?

## 2011-06-14 NOTE — Telephone Encounter (Signed)
OK to fill this prescription with additional refills x2 Thank you!  

## 2011-06-15 MED ORDER — HYDROCODONE-ACETAMINOPHEN 7.5-325 MG PO TABS: 1.0000 | ORAL_TABLET | Freq: Four times a day (QID) | ORAL | Status: DC | PRN

## 2011-07-05 ENCOUNTER — Ambulatory Visit (INDEPENDENT_AMBULATORY_CARE_PROVIDER_SITE_OTHER): Payer: Medicare Other | Admitting: Internal Medicine

## 2011-07-05 ENCOUNTER — Encounter: Payer: Self-pay | Admitting: Internal Medicine

## 2011-07-05 DIAGNOSIS — M545 Low back pain, unspecified: Secondary | ICD-10-CM

## 2011-07-05 DIAGNOSIS — F329 Major depressive disorder, single episode, unspecified: Secondary | ICD-10-CM

## 2011-07-05 DIAGNOSIS — M1990 Unspecified osteoarthritis, unspecified site: Secondary | ICD-10-CM

## 2011-07-05 DIAGNOSIS — G4700 Insomnia, unspecified: Secondary | ICD-10-CM

## 2011-07-05 DIAGNOSIS — N400 Enlarged prostate without lower urinary tract symptoms: Secondary | ICD-10-CM

## 2011-07-05 DIAGNOSIS — M720 Palmar fascial fibromatosis [Dupuytren]: Secondary | ICD-10-CM

## 2011-07-05 DIAGNOSIS — F3289 Other specified depressive episodes: Secondary | ICD-10-CM

## 2011-07-05 MED ORDER — FINASTERIDE 5 MG PO TABS: 5.0000 mg | ORAL_TABLET | Freq: Every day | ORAL | Status: DC

## 2011-07-05 NOTE — Assessment & Plan Note (Signed)
Continue with current prescription therapy as reflected on the Med list.  

## 2011-07-05 NOTE — Progress Notes (Signed)
  Subjective:    Patient ID: Justin [name], male    DOB: Dec 21, 1940, 71 y.o.   MRN: [id number]  HPI   The patient is here to follow up on chronic depression, anxiety, headaches and chronic moderate OA, LBP symptoms controlled with medicines.   Review of Systems  Constitutional: Negative for appetite change, fatigue and unexpected weight change.  HENT: Negative for nosebleeds, congestion, sore throat, sneezing, trouble swallowing and neck pain.   Eyes: Negative for itching and visual disturbance.  Respiratory: Negative for cough.   Cardiovascular: Negative for chest pain, palpitations and leg swelling.  Gastrointestinal: Negative for nausea, diarrhea, blood in stool and abdominal distention.  Genitourinary: Positive for urgency and frequency. Negative for hematuria.  Musculoskeletal: Positive for back pain and arthralgias. Negative for joint swelling and gait problem.  Skin: Negative for rash.  Neurological: Negative for dizziness, tremors, speech difficulty and weakness.  Psychiatric/Behavioral: Negative for sleep disturbance, dysphoric mood and agitation. The patient is not nervous/anxious.        Objective:   Physical Exam  Constitutional: He is oriented to person, place, and time. He appears well-developed.  HENT:  Mouth/Throat: Oropharynx is clear and moist.  Eyes: Conjunctivae are normal. Pupils are equal, round, and reactive to light.  Neck: Normal range of motion. No JVD present. No thyromegaly present.  Cardiovascular: Normal rate, regular rhythm, normal heart sounds and intact distal pulses.  Exam reveals no gallop and no friction rub.   No murmur heard. Pulmonary/Chest: Effort normal and breath sounds normal. No respiratory distress. He has no wheezes. He has no rales. He exhibits no tenderness.  Abdominal: Soft. Bowel sounds are normal. He exhibits no distension and no mass. There is no tenderness. There is no rebound and no guarding.  Musculoskeletal: Normal range  of motion. He exhibits tenderness (shoulders, LS. L palm is contracted). He exhibits no edema.  Lymphadenopathy:    He has no cervical adenopathy.  Neurological: He is alert and oriented to person, place, and time. He has normal reflexes. No cranial nerve deficit. He exhibits normal muscle tone. Coordination normal.  Skin: Skin is warm and dry. No rash noted.  Psychiatric: He has a normal mood and affect. His behavior is normal. Judgment and thought content normal.          Assessment & Plan:

## 2011-07-05 NOTE — Assessment & Plan Note (Signed)
Not better w/Tumsulosin will d/c Will try Proscar

## 2011-07-05 NOTE — Assessment & Plan Note (Signed)
He will f/u w/Dr Amanda Pea L hand - chronic, worse in 2012

## 2011-10-10 ENCOUNTER — Encounter: Payer: Self-pay | Admitting: Internal Medicine

## 2011-10-10 ENCOUNTER — Ambulatory Visit: Payer: Medicare Other | Admitting: Internal Medicine

## 2011-10-10 ENCOUNTER — Ambulatory Visit (INDEPENDENT_AMBULATORY_CARE_PROVIDER_SITE_OTHER): Payer: Medicare Other | Admitting: Internal Medicine

## 2011-10-10 DIAGNOSIS — M545 Low back pain, unspecified: Secondary | ICD-10-CM

## 2011-10-10 DIAGNOSIS — G2581 Restless legs syndrome: Secondary | ICD-10-CM

## 2011-10-10 DIAGNOSIS — J309 Allergic rhinitis, unspecified: Secondary | ICD-10-CM

## 2011-10-10 DIAGNOSIS — E538 Deficiency of other specified B group vitamins: Secondary | ICD-10-CM

## 2011-10-10 DIAGNOSIS — F329 Major depressive disorder, single episode, unspecified: Secondary | ICD-10-CM

## 2011-10-10 MED ORDER — LORATADINE 10 MG PO TABS: 10.0000 mg | ORAL_TABLET | Freq: Every day | ORAL | Status: DC | PRN

## 2011-10-10 MED ORDER — HYDROCODONE-ACETAMINOPHEN 7.5-325 MG PO TABS: 1.0000 | ORAL_TABLET | Freq: Four times a day (QID) | ORAL | Status: DC | PRN

## 2011-10-10 MED ORDER — FLUTICASONE PROPIONATE 50 MCG/ACT NA SUSP: 2.0000 | Freq: Every day | NASAL | Status: DC

## 2011-10-10 NOTE — Assessment & Plan Note (Signed)
Flonase Loratidine 

## 2011-10-10 NOTE — Assessment & Plan Note (Signed)
Continue with current prescription therapy as reflected on the Med list.  

## 2011-10-10 NOTE — Progress Notes (Signed)
Patient ID: Justin [name], male   DOB: 05-13-1941, 71 y.o.   MRN: [id number]  Subjective:    Patient ID: Justin [name], male    DOB: 21-Dec-1940, 71 y.o.   MRN: [id number]  HPI   The patient is here to follow up on chronic depression, anxiety, headaches and chronic moderate OA, LBP symptoms controlled with medicines. C/o occ ST - nasal drainage  Wt Readings from Last 3 Encounters:  10/10/11 188 lb (85.276 kg)  07/05/11 180 lb (81.647 kg)  03/30/11 177 lb (80.287 kg)   Wt Readings from Last 3 Encounters:  10/10/11 188 lb (85.276 kg)  07/05/11 180 lb (81.647 kg)  03/30/11 177 lb (80.287 kg)      Review of Systems  Constitutional: Negative for appetite change, fatigue and unexpected weight change.  HENT: Negative for nosebleeds, congestion, sore throat, sneezing, trouble swallowing and neck pain.   Eyes: Negative for itching and visual disturbance.  Respiratory: Negative for cough.   Cardiovascular: Negative for chest pain, palpitations and leg swelling.  Gastrointestinal: Negative for nausea, diarrhea, blood in stool and abdominal distention.  Genitourinary: Positive for urgency and frequency. Negative for hematuria.  Musculoskeletal: Positive for back pain and arthralgias. Negative for joint swelling and gait problem.  Skin: Negative for rash.  Neurological: Negative for dizziness, tremors, speech difficulty and weakness.  Psychiatric/Behavioral: Negative for sleep disturbance, dysphoric mood and agitation. The patient is not nervous/anxious.        Objective:   Physical Exam  Constitutional: He is oriented to person, place, and time. He appears well-developed.  HENT:  Mouth/Throat: Oropharynx is clear and moist.  Eyes: Conjunctivae are normal. Pupils are equal, round, and reactive to light.  Neck: Normal range of motion. No JVD present. No thyromegaly present.  Cardiovascular: Normal rate, regular rhythm, normal heart sounds and intact distal pulses.  Exam reveals  no gallop and no friction rub.   No murmur heard. Pulmonary/Chest: Effort normal and breath sounds normal. No respiratory distress. He has no wheezes. He has no rales. He exhibits no tenderness.  Abdominal: Soft. Bowel sounds are normal. He exhibits no distension and no mass. There is no tenderness. There is no rebound and no guarding.  Musculoskeletal: Normal range of motion. He exhibits tenderness (shoulders, LS. L palm is contracted). He exhibits no edema.  Lymphadenopathy:    He has no cervical adenopathy.  Neurological: He is alert and oriented to person, place, and time. He has normal reflexes. No cranial nerve deficit. He exhibits normal muscle tone. Coordination normal.  Skin: Skin is warm and dry. No rash noted.  Psychiatric: He has a normal mood and affect. His behavior is normal. Judgment and thought content normal.    Lab Results  Component Value Date   WBC 6.6 12/19/2010   HGB 11.3* 12/19/2010   HCT 32.4* 12/19/2010   PLT 214.0 12/19/2010   GLUCOSE 92 12/19/2010   CHOL 146 12/19/2010   TRIG 76.0 12/19/2010   HDL 40.20 12/19/2010   LDLCALC 91 12/19/2010   ALT 13 12/19/2010   AST 19 12/19/2010   NA 142 12/19/2010   K 4.4 12/19/2010   CL 103 12/19/2010   CREATININE 0.9 12/19/2010   BUN 15 12/19/2010   CO2 27 12/19/2010   TSH 2.70 12/19/2010   PSA 2.65 06/23/2010   HGBA1C  Value: 5.7 (NOTE)   The ADA recommends the following therapeutic goals for glycemic   control related to Hgb A1C measurement:   Goal of Therapy:   <  7.0% Hgb A1C   Action Suggested:  > 8.0% Hgb A1C   Ref:  Diabetes Care, 22, Suppl. 1, 1999 12/24/2007         Assessment & Plan:

## 2011-10-27 ENCOUNTER — Telehealth: Payer: Self-pay

## 2011-10-27 MED ORDER — AMOXICILLIN 500 MG PO CAPS: 1000.0000 mg | ORAL_CAPSULE | Freq: Two times a day (BID) | ORAL | Status: AC

## 2011-10-27 NOTE — Telephone Encounter (Signed)
Pt called stating he has developed a sinus infection, green mucus and low grade fever.  Pt is requesting Rx for zpak or amoxicillin. Please advise.

## 2011-10-27 NOTE — Telephone Encounter (Signed)
Ok amoxicillin Thx 

## 2011-10-27 NOTE — Telephone Encounter (Signed)
Pt advised of Rx and pharmacy 

## 2011-11-28 ENCOUNTER — Telehealth: Payer: Self-pay | Admitting: Internal Medicine

## 2011-11-28 NOTE — Telephone Encounter (Signed)
Caller: Justin [name]/Patient; PCP: Sonda Primes;  Call regarding Son Is Not Established Patient But Will Having Disablitly Hearing On 12/06/11 and Read Is Wondering If Report Can Be Sent To Dr. Posey Rea So Annia Friendly Can Pick It Up for Personal Records. [name] and his wife have patients for over 15 years. Son's name is: Justin [name]; DOB: 08/22/1963- He Does Not Currently Have Insurance Or PCP. Uses [facility] for [facility] Issues Right Now; PLEASE GIVE Arth A CALL TO LET HIM KNOW IF RECORDS CAN BE SENT TO OFFICE.  CB#: [phone];

## 2011-11-29 NOTE — Telephone Encounter (Signed)
If the son is not my pt I can't have any info on him. Sorry. Thx

## 2012-02-09 ENCOUNTER — Ambulatory Visit: Payer: Medicare Other | Admitting: Internal Medicine

## 2012-02-12 ENCOUNTER — Other Ambulatory Visit (INDEPENDENT_AMBULATORY_CARE_PROVIDER_SITE_OTHER): Payer: Medicare Other

## 2012-02-12 ENCOUNTER — Ambulatory Visit (INDEPENDENT_AMBULATORY_CARE_PROVIDER_SITE_OTHER): Payer: Medicare Other | Admitting: Internal Medicine

## 2012-02-12 ENCOUNTER — Encounter: Payer: Self-pay | Admitting: Internal Medicine

## 2012-02-12 ENCOUNTER — Other Ambulatory Visit: Payer: Self-pay | Admitting: *Deleted

## 2012-02-12 VITALS — BP 124/72 | HR 55 | Temp 97.6°F | Ht 72.0 in | Wt 184.0 lb

## 2012-02-12 DIAGNOSIS — F3289 Other specified depressive episodes: Secondary | ICD-10-CM

## 2012-02-12 DIAGNOSIS — E538 Deficiency of other specified B group vitamins: Secondary | ICD-10-CM

## 2012-02-12 DIAGNOSIS — M545 Low back pain, unspecified: Secondary | ICD-10-CM

## 2012-02-12 DIAGNOSIS — N400 Enlarged prostate without lower urinary tract symptoms: Secondary | ICD-10-CM

## 2012-02-12 DIAGNOSIS — F329 Major depressive disorder, single episode, unspecified: Secondary | ICD-10-CM

## 2012-02-12 DIAGNOSIS — G2581 Restless legs syndrome: Secondary | ICD-10-CM

## 2012-02-12 LAB — URINALYSIS, ROUTINE W REFLEX MICROSCOPIC
Bilirubin Urine: NEGATIVE
Hgb urine dipstick: NEGATIVE
Ketones, ur: NEGATIVE
Leukocytes, UA: NEGATIVE
Nitrite: NEGATIVE
Specific Gravity, Urine: >=1.03 (ref 1.000–1.030)
Total Protein, Urine: NEGATIVE
Urine Glucose: NEGATIVE
Urobilinogen, UA: 0.2 (ref 0.0–1.0)
pH: 5 (ref 5.0–8.0)

## 2012-02-12 LAB — CBC WITH DIFFERENTIAL/PLATELET
Basophils Absolute: 0 10*3/uL (ref 0.0–0.1)
Basophils Relative: 0.5 % (ref 0.0–3.0)
Eosinophils Absolute: 0.3 10*3/uL (ref 0.0–0.7)
Eosinophils Relative: 5.5 % — ABNORMAL HIGH (ref 0.0–5.0)
HCT: 34.1 % — ABNORMAL LOW (ref 39.0–52.0)
Hemoglobin: 11.5 g/dL — ABNORMAL LOW (ref 13.0–17.0)
Lymphocytes Relative: 24.7 % (ref 12.0–46.0)
Lymphs Abs: 1.4 10*3/uL (ref 0.7–4.0)
MCHC: 33.8 g/dL (ref 30.0–36.0)
MCV: 87.9 fl (ref 78.0–100.0)
Monocytes Absolute: 0.5 10*3/uL (ref 0.1–1.0)
Monocytes Relative: 8.3 % (ref 3.0–12.0)
Neutro Abs: 3.5 10*3/uL (ref 1.4–7.7)
Neutrophils Relative %: 61 % (ref 43.0–77.0)
Platelets: 200 10*3/uL (ref 150.0–400.0)
RBC: 3.88 Mil/uL — ABNORMAL LOW (ref 4.22–5.81)
RDW: 14.8 % — ABNORMAL HIGH (ref 11.5–14.6)
WBC: 5.7 10*3/uL (ref 4.5–10.5)

## 2012-02-12 LAB — BASIC METABOLIC PANEL
BUN: 26 mg/dL — ABNORMAL HIGH (ref 6–23)
CO2: 28 mEq/L (ref 19–32)
Calcium: 9.2 mg/dL (ref 8.4–10.5)
Chloride: 106 mEq/L (ref 96–112)
Creatinine, Ser: 1.2 mg/dL (ref 0.4–1.5)
GFR: 63.73 mL/min (ref >60.00)
Glucose, Bld: 81 mg/dL (ref 70–99)
Potassium: 4.9 mEq/L (ref 3.5–5.1)
Sodium: 141 mEq/L (ref 135–145)

## 2012-02-12 LAB — VITAMIN B12: Vitamin B-12: 701 pg/mL (ref 211–911)

## 2012-02-12 LAB — PSA: PSA: 0.62 ng/mL (ref 0.10–4.00)

## 2012-02-12 MED ORDER — HYDROCODONE-ACETAMINOPHEN 7.5-325 MG PO TABS: 1.0000 | ORAL_TABLET | Freq: Four times a day (QID) | ORAL | Status: DC | PRN

## 2012-02-12 NOTE — Assessment & Plan Note (Signed)
Continue with current prescription therapy as reflected on the Med list.  

## 2012-02-12 NOTE — Assessment & Plan Note (Signed)
better 

## 2012-02-12 NOTE — Assessment & Plan Note (Signed)
Not on meds 

## 2012-02-12 NOTE — Progress Notes (Signed)
Subjective:    Patient ID: Justin [name], male    DOB: July 01, 1941, 71 y.o.   MRN: [id number]  HPI   The patient is here to follow up on chronic depression, anxiety, headaches and chronic moderate OA, LBP symptoms controlled with medicines. C/o occ ST - nasal drainage  Wt Readings from Last 3 Encounters:  02/12/12 184 lb (83.462 kg)  10/10/11 188 lb (85.276 kg)  07/05/11 180 lb (81.647 kg)   Wt Readings from Last 3 Encounters:  02/12/12 184 lb (83.462 kg)  10/10/11 188 lb (85.276 kg)  07/05/11 180 lb (81.647 kg)      Review of Systems  Constitutional: Negative for appetite change, fatigue and unexpected weight change.  HENT: Negative for nosebleeds, congestion, sore throat, sneezing, trouble swallowing and neck pain.   Eyes: Negative for itching and visual disturbance.  Respiratory: Negative for cough.   Cardiovascular: Negative for chest pain, palpitations and leg swelling.  Gastrointestinal: Negative for nausea, diarrhea, blood in stool and abdominal distention.  Genitourinary: Positive for urgency and frequency. Negative for hematuria.  Musculoskeletal: Positive for back pain and arthralgias. Negative for joint swelling and gait problem.  Skin: Negative for rash.  Neurological: Negative for dizziness, tremors, speech difficulty and weakness.  Psychiatric/Behavioral: Negative for disturbed wake/sleep cycle, dysphoric mood and agitation. The patient is not nervous/anxious.        Objective:   Physical Exam  Constitutional: He is oriented to person, place, and time. He appears well-developed.  HENT:  Mouth/Throat: Oropharynx is clear and moist.  Eyes: Conjunctivae are normal. Pupils are equal, round, and reactive to light.  Neck: Normal range of motion. No JVD present. No thyromegaly present.  Cardiovascular: Normal rate, regular rhythm, normal heart sounds and intact distal pulses.  Exam reveals no gallop and no friction rub.   No murmur heard. Pulmonary/Chest:  Effort normal and breath sounds normal. No respiratory distress. He has no wheezes. He has no rales. He exhibits no tenderness.  Abdominal: Soft. Bowel sounds are normal. He exhibits no distension and no mass. There is no tenderness. There is no rebound and no guarding.  Musculoskeletal: Normal range of motion. He exhibits tenderness (shoulders, LS. L palm is contracted). He exhibits no edema.  Lymphadenopathy:    He has no cervical adenopathy.  Neurological: He is alert and oriented to person, place, and time. He has normal reflexes. No cranial nerve deficit. He exhibits normal muscle tone. Coordination normal.  Skin: Skin is warm and dry. No rash noted.  Psychiatric: He has a normal mood and affect. His behavior is normal. Judgment and thought content normal.    Lab Results  Component Value Date   WBC 6.6 12/19/2010   HGB 11.3* 12/19/2010   HCT 32.4* 12/19/2010   PLT 214.0 12/19/2010   GLUCOSE 92 12/19/2010   CHOL 146 12/19/2010   TRIG 76.0 12/19/2010   HDL 40.20 12/19/2010   LDLCALC 91 12/19/2010   ALT 13 12/19/2010   AST 19 12/19/2010   NA 142 12/19/2010   K 4.4 12/19/2010   CL 103 12/19/2010   CREATININE 0.9 12/19/2010   BUN 15 12/19/2010   CO2 27 12/19/2010   TSH 2.70 12/19/2010   PSA 2.65 06/23/2010   HGBA1C  Value: 5.7 (NOTE)   The ADA recommends the following therapeutic goals for glycemic   control related to Hgb A1C measurement:   Goal of Therapy:   < 7.0% Hgb A1C   Action Suggested:  > 8.0% Hgb A1C   Ref:  Diabetes Care, 22, Suppl. 1, 1999 12/24/2007         Assessment & Plan:

## 2012-02-13 ENCOUNTER — Telehealth: Payer: Self-pay | Admitting: Internal Medicine

## 2012-02-13 NOTE — Telephone Encounter (Signed)
Justin [name], please, inform patient that all labs are normal except for a mild anemia - chronic, OK Thx

## 2012-02-13 NOTE — Telephone Encounter (Signed)
Notified pt with md response... 02/13/12@3 :12pm/LMB

## 2012-05-09 ENCOUNTER — Ambulatory Visit (INDEPENDENT_AMBULATORY_CARE_PROVIDER_SITE_OTHER): Payer: Medicare Other | Admitting: *Deleted

## 2012-05-09 DIAGNOSIS — Z23 Encounter for immunization: Secondary | ICD-10-CM

## 2012-05-16 ENCOUNTER — Ambulatory Visit: Payer: Medicare Other | Admitting: Internal Medicine

## 2012-05-29 ENCOUNTER — Ambulatory Visit (INDEPENDENT_AMBULATORY_CARE_PROVIDER_SITE_OTHER): Payer: Medicare Other | Admitting: Internal Medicine

## 2012-05-29 ENCOUNTER — Encounter: Payer: Self-pay | Admitting: Internal Medicine

## 2012-05-29 VITALS — BP 120/60 | HR 58 | Temp 97.1°F | Resp 16 | Wt 177.0 lb

## 2012-05-29 DIAGNOSIS — N400 Enlarged prostate without lower urinary tract symptoms: Secondary | ICD-10-CM

## 2012-05-29 DIAGNOSIS — M545 Low back pain, unspecified: Secondary | ICD-10-CM

## 2012-05-29 DIAGNOSIS — E559 Vitamin D deficiency, unspecified: Secondary | ICD-10-CM

## 2012-05-29 DIAGNOSIS — F329 Major depressive disorder, single episode, unspecified: Secondary | ICD-10-CM

## 2012-05-29 DIAGNOSIS — E538 Deficiency of other specified B group vitamins: Secondary | ICD-10-CM

## 2012-05-29 MED ORDER — TAMSULOSIN HCL 0.4 MG PO CAPS: 0.4000 mg | ORAL_CAPSULE | Freq: Every day | ORAL | Status: DC

## 2012-05-29 NOTE — Assessment & Plan Note (Signed)
Continue with current prescription therapy as reflected on the Med list.  

## 2012-05-29 NOTE — Progress Notes (Signed)
Subjective:    Patient ID: Justin [name], male    DOB: March 10, 1941, 71 y.o.   MRN: [id number]  HPI   The patient is here to follow up on chronic depression, anxiety, headaches and chronic moderate OA, LBP symptoms controlled with medicines. C/o urinating more often now  Wt Readings from Last 3 Encounters:  05/29/12 177 lb (80.287 kg)  02/12/12 184 lb (83.462 kg)  10/10/11 188 lb (85.276 kg)   BP Readings from Last 3 Encounters:  05/29/12 120/60  02/12/12 124/72  10/10/11 120/78   Review of Systems  Constitutional: Negative for appetite change, fatigue and unexpected weight change.  HENT: Negative for nosebleeds, congestion, sore throat, sneezing, trouble swallowing and neck pain.   Eyes: Negative for itching and visual disturbance.  Respiratory: Negative for cough.   Cardiovascular: Negative for chest pain, palpitations and leg swelling.  Gastrointestinal: Negative for nausea, diarrhea, blood in stool and abdominal distention.  Genitourinary: Positive for urgency and frequency. Negative for hematuria.  Musculoskeletal: Positive for back pain and arthralgias. Negative for joint swelling and gait problem.  Skin: Negative for rash.  Neurological: Negative for dizziness, tremors, speech difficulty and weakness.  Psychiatric/Behavioral: Negative for sleep disturbance, dysphoric mood and agitation. The patient is not nervous/anxious.        Objective:   Physical Exam  Constitutional: He is oriented to person, place, and time. He appears well-developed.  HENT:  Mouth/Throat: Oropharynx is clear and moist.  Eyes: Conjunctivae normal are normal. Pupils are equal, round, and reactive to light.  Neck: Normal range of motion. No JVD present. No thyromegaly present.  Cardiovascular: Normal rate, regular rhythm, normal heart sounds and intact distal pulses.  Exam reveals no gallop and no friction rub.   No murmur heard. Pulmonary/Chest: Effort normal and breath sounds normal. No  respiratory distress. He has no wheezes. He has no rales. He exhibits no tenderness.  Abdominal: Soft. Bowel sounds are normal. He exhibits no distension and no mass. There is no tenderness. There is no rebound and no guarding.  Musculoskeletal: Normal range of motion. He exhibits tenderness (shoulders, LS. L palm is contracted). He exhibits no edema.  Lymphadenopathy:    He has no cervical adenopathy.  Neurological: He is alert and oriented to person, place, and time. He has normal reflexes. No cranial nerve deficit. He exhibits normal muscle tone. Coordination normal.  Skin: Skin is warm and dry. No rash noted.  Psychiatric: He has a normal mood and affect. His behavior is normal. Judgment and thought content normal.    Lab Results  Component Value Date   WBC 5.7 02/12/2012   HGB 11.5* 02/12/2012   HCT 34.1* 02/12/2012   PLT 200.0 02/12/2012   GLUCOSE 81 02/12/2012   CHOL 146 12/19/2010   TRIG 76.0 12/19/2010   HDL 40.20 12/19/2010   LDLCALC 91 12/19/2010   ALT 13 12/19/2010   AST 19 12/19/2010   NA 141 02/12/2012   K 4.9 02/12/2012   CL 106 02/12/2012   CREATININE 1.2 02/12/2012   BUN 26* 02/12/2012   CO2 28 02/12/2012   TSH 2.70 12/19/2010   PSA 0.62 02/12/2012   HGBA1C  Value: 5.7 (NOTE)   The ADA recommends the following therapeutic goals for glycemic   control related to Hgb A1C measurement:   Goal of Therapy:   < 7.0% Hgb A1C   Action Suggested:  > 8.0% Hgb A1C   Ref:  Diabetes Care, 22, Suppl. 1, 1999 12/24/2007  Assessment & Plan:

## 2012-07-16 ENCOUNTER — Other Ambulatory Visit: Payer: Self-pay | Admitting: Internal Medicine

## 2012-07-23 ENCOUNTER — Other Ambulatory Visit: Payer: Self-pay | Admitting: Internal Medicine

## 2012-08-29 ENCOUNTER — Ambulatory Visit (INDEPENDENT_AMBULATORY_CARE_PROVIDER_SITE_OTHER): Payer: Medicare Other | Admitting: Internal Medicine

## 2012-08-29 ENCOUNTER — Encounter: Payer: Self-pay | Admitting: Internal Medicine

## 2012-08-29 VITALS — BP 130/94 | HR 76 | Temp 96.8°F | Resp 16 | Wt 177.0 lb

## 2012-08-29 DIAGNOSIS — M545 Low back pain, unspecified: Secondary | ICD-10-CM

## 2012-08-29 DIAGNOSIS — F3289 Other specified depressive episodes: Secondary | ICD-10-CM

## 2012-08-29 DIAGNOSIS — E538 Deficiency of other specified B group vitamins: Secondary | ICD-10-CM

## 2012-08-29 DIAGNOSIS — M720 Palmar fascial fibromatosis [Dupuytren]: Secondary | ICD-10-CM

## 2012-08-29 DIAGNOSIS — L600 Ingrowing nail: Secondary | ICD-10-CM

## 2012-08-29 DIAGNOSIS — F329 Major depressive disorder, single episode, unspecified: Secondary | ICD-10-CM

## 2012-08-29 DIAGNOSIS — E559 Vitamin D deficiency, unspecified: Secondary | ICD-10-CM

## 2012-08-29 DIAGNOSIS — G4700 Insomnia, unspecified: Secondary | ICD-10-CM

## 2012-08-29 DIAGNOSIS — N400 Enlarged prostate without lower urinary tract symptoms: Secondary | ICD-10-CM

## 2012-08-29 DIAGNOSIS — G2581 Restless legs syndrome: Secondary | ICD-10-CM

## 2012-08-29 MED ORDER — HYDROCODONE-ACETAMINOPHEN 7.5-325 MG PO TABS: 1.0000 | ORAL_TABLET | Freq: Four times a day (QID) | ORAL | Status: DC | PRN

## 2012-08-29 MED ORDER — DOXYCYCLINE HYCLATE 100 MG PO TABS: 100.0000 mg | ORAL_TABLET | Freq: Two times a day (BID) | ORAL | Status: DC

## 2012-08-29 NOTE — Assessment & Plan Note (Signed)
Continue with current prescription therapy as reflected on the Med list.  

## 2012-08-29 NOTE — Assessment & Plan Note (Signed)
Dr Ralene Cork consult Doxy if gets infected

## 2012-08-29 NOTE — Assessment & Plan Note (Signed)
Doing better.   

## 2012-08-29 NOTE — Progress Notes (Signed)
Subjective:     HPI   The patient is here to follow up on chronic depression, anxiety, headaches and chronic moderate OA, LBP symptoms controlled with medicines. C/o urinating more often now C/o L big toe pain off and on Wt Readings from Last 3 Encounters:  08/29/12 177 lb (80.287 kg)  05/29/12 177 lb (80.287 kg)  02/12/12 184 lb (83.462 kg)   BP Readings from Last 3 Encounters:  08/29/12 130/94  05/29/12 120/60  02/12/12 124/72   Review of Systems  Constitutional: Negative for appetite change, fatigue and unexpected weight change.  HENT: Negative for nosebleeds, congestion, sore throat, sneezing, trouble swallowing and neck pain.   Eyes: Negative for itching and visual disturbance.  Respiratory: Negative for cough.   Cardiovascular: Negative for chest pain, palpitations and leg swelling.  Gastrointestinal: Negative for nausea, diarrhea, blood in stool and abdominal distention.  Genitourinary: Positive for urgency and frequency. Negative for hematuria.  Musculoskeletal: Positive for back pain and arthralgias. Negative for joint swelling and gait problem.  Skin: Negative for rash.  Neurological: Negative for dizziness, tremors, speech difficulty and weakness.  Psychiatric/Behavioral: Negative for sleep disturbance, dysphoric mood and agitation. The patient is not nervous/anxious.        Objective:   Physical Exam  Constitutional: He is oriented to person, place, and time. He appears well-developed.  HENT:  Mouth/Throat: Oropharynx is clear and moist.  Eyes: Conjunctivae are normal. Pupils are equal, round, and reactive to light.  Neck: Normal range of motion. No JVD present. No thyromegaly present.  Cardiovascular: Normal rate, regular rhythm, normal heart sounds and intact distal pulses.  Exam reveals no gallop and no friction rub.   No murmur heard. Pulmonary/Chest: Effort normal and breath sounds normal. No respiratory distress. He has no wheezes. He has no rales. He  exhibits no tenderness.  Abdominal: Soft. Bowel sounds are normal. He exhibits no distension and no mass. There is no tenderness. There is no rebound and no guarding.  Musculoskeletal: Normal range of motion. He exhibits tenderness (shoulders, LS. L palm is contracted). He exhibits no edema.  Lymphadenopathy:    He has no cervical adenopathy.  Neurological: He is alert and oriented to person, place, and time. He has normal reflexes. No cranial nerve deficit. He exhibits normal muscle tone. Coordination normal.  Skin: Skin is warm and dry. No rash noted.  Psychiatric: He has a normal mood and affect. His behavior is normal. Judgment and thought content normal.  L big toe - ingr toenail  Lab Results  Component Value Date   WBC 5.7 02/12/2012   HGB 11.5* 02/12/2012   HCT 34.1* 02/12/2012   PLT 200.0 02/12/2012   GLUCOSE 81 02/12/2012   CHOL 146 12/19/2010   TRIG 76.0 12/19/2010   HDL 40.20 12/19/2010   LDLCALC 91 12/19/2010   ALT 13 12/19/2010   AST 19 12/19/2010   NA 141 02/12/2012   K 4.9 02/12/2012   CL 106 02/12/2012   CREATININE 1.2 02/12/2012   BUN 26* 02/12/2012   CO2 28 02/12/2012   TSH 2.70 12/19/2010   PSA 0.62 02/12/2012   HGBA1C  Value: 5.7 (NOTE)   The ADA recommends the following therapeutic goals for glycemic   control related to Hgb A1C measurement:   Goal of Therapy:   < 7.0% Hgb A1C   Action Suggested:  > 8.0% Hgb A1C   Ref:  Diabetes Care, 22, Suppl. 1, 1999 12/24/2007         Assessment &  Plan:

## 2012-11-27 ENCOUNTER — Ambulatory Visit: Payer: Medicare Other | Admitting: Internal Medicine

## 2012-11-28 ENCOUNTER — Ambulatory Visit (INDEPENDENT_AMBULATORY_CARE_PROVIDER_SITE_OTHER): Payer: Medicare Other | Admitting: Internal Medicine

## 2012-11-28 ENCOUNTER — Encounter: Payer: Self-pay | Admitting: Internal Medicine

## 2012-11-28 VITALS — BP 130/80 | HR 68 | Temp 97.1°F | Resp 16 | Wt 182.0 lb

## 2012-11-28 DIAGNOSIS — F329 Major depressive disorder, single episode, unspecified: Secondary | ICD-10-CM

## 2012-11-28 DIAGNOSIS — N400 Enlarged prostate without lower urinary tract symptoms: Secondary | ICD-10-CM

## 2012-11-28 DIAGNOSIS — M545 Low back pain, unspecified: Secondary | ICD-10-CM

## 2012-11-28 DIAGNOSIS — E559 Vitamin D deficiency, unspecified: Secondary | ICD-10-CM

## 2012-11-28 DIAGNOSIS — E538 Deficiency of other specified B group vitamins: Secondary | ICD-10-CM

## 2012-11-28 MED ORDER — HYDROCODONE-ACETAMINOPHEN 7.5-325 MG PO TABS: 1.0000 | ORAL_TABLET | Freq: Four times a day (QID) | ORAL | Status: DC | PRN

## 2012-11-28 MED ORDER — VITAMIN B-12 1000 MCG SL SUBL: SUBLINGUAL_TABLET | SUBLINGUAL | Status: DC

## 2012-11-28 NOTE — Assessment & Plan Note (Signed)
Not on rx 

## 2012-11-28 NOTE — Assessment & Plan Note (Signed)
Continue with current prescription therapy as reflected on the Med list.  

## 2012-11-28 NOTE — Progress Notes (Deleted)
  Subjective:    Patient ID: Justin [name], male    DOB: [birth year], 72 y.o.   MRN: [id number]  HPI    Review of Systems     Objective:   Physical Exam        Assessment & Plan:

## 2012-11-28 NOTE — Assessment & Plan Note (Addendum)
Re-start rx 

## 2012-11-28 NOTE — Progress Notes (Signed)
   Subjective:     HPI   The patient is here to follow up on chronic depression, anxiety, headaches and chronic moderate OA, LBP symptoms controlled with medicines. C/o urinating more often now  Wt Readings from Last 3 Encounters:  11/28/12 182 lb (82.555 kg)  08/29/12 177 lb (80.287 kg)  05/29/12 177 lb (80.287 kg)   BP Readings from Last 3 Encounters:  11/28/12 130/80  08/29/12 130/94  05/29/12 120/60   Review of Systems  Constitutional: Negative for appetite change, fatigue and unexpected weight change.  HENT: Negative for nosebleeds, congestion, sore throat, sneezing, trouble swallowing and neck pain.   Eyes: Negative for itching and visual disturbance.  Respiratory: Negative for cough.   Cardiovascular: Negative for chest pain, palpitations and leg swelling.  Gastrointestinal: Negative for nausea, diarrhea, blood in stool and abdominal distention.  Genitourinary: Positive for urgency and frequency. Negative for hematuria.  Musculoskeletal: Positive for back pain and arthralgias. Negative for joint swelling and gait problem.  Skin: Negative for rash.  Neurological: Negative for dizziness, tremors, speech difficulty and weakness.  Psychiatric/Behavioral: Negative for sleep disturbance, dysphoric mood and agitation. The patient is not nervous/anxious.        Objective:   Physical Exam  Constitutional: He is oriented to person, place, and time. He appears well-developed.  HENT:  Mouth/Throat: Oropharynx is clear and moist.  Eyes: Conjunctivae are normal. Pupils are equal, round, and reactive to light.  Neck: Normal range of motion. No JVD present. No thyromegaly present.  Cardiovascular: Normal rate, regular rhythm, normal heart sounds and intact distal pulses.  Exam reveals no gallop and no friction rub.   No murmur heard. Pulmonary/Chest: Effort normal and breath sounds normal. No respiratory distress. He has no wheezes. He has no rales. He exhibits no tenderness.   Abdominal: Soft. Bowel sounds are normal. He exhibits no distension and no mass. There is no tenderness. There is no rebound and no guarding.  Musculoskeletal: Normal range of motion. He exhibits tenderness (shoulders, LS. L palm is contracted). He exhibits no edema.  Lymphadenopathy:    He has no cervical adenopathy.  Neurological: He is alert and oriented to person, place, and time. He has normal reflexes. No cranial nerve deficit. He exhibits normal muscle tone. Coordination normal.  Skin: Skin is warm and dry. No rash noted.  Psychiatric: He has a normal mood and affect. His behavior is normal. Judgment and thought content normal.    Lab Results  Component Value Date   WBC 5.7 02/12/2012   HGB 11.5* 02/12/2012   HCT 34.1* 02/12/2012   PLT 200.0 02/12/2012   GLUCOSE 81 02/12/2012   CHOL 146 12/19/2010   TRIG 76.0 12/19/2010   HDL 40.20 12/19/2010   LDLCALC 91 12/19/2010   ALT 13 12/19/2010   AST 19 12/19/2010   NA 141 02/12/2012   K 4.9 02/12/2012   CL 106 02/12/2012   CREATININE 1.2 02/12/2012   BUN 26* 02/12/2012   CO2 28 02/12/2012   TSH 2.70 12/19/2010   PSA 0.62 02/12/2012   HGBA1C  Value: 5.7 (NOTE)   The ADA recommends the following therapeutic goals for glycemic   control related to Hgb A1C measurement:   Goal of Therapy:   < 7.0% Hgb A1C   Action Suggested:  > 8.0% Hgb A1C   Ref:  Diabetes Care, 22, Suppl. 1, 1999 12/24/2007         Assessment & Plan:

## 2013-02-25 ENCOUNTER — Other Ambulatory Visit (INDEPENDENT_AMBULATORY_CARE_PROVIDER_SITE_OTHER): Payer: Medicare Other

## 2013-02-25 DIAGNOSIS — N400 Enlarged prostate without lower urinary tract symptoms: Secondary | ICD-10-CM

## 2013-02-25 DIAGNOSIS — M545 Low back pain, unspecified: Secondary | ICD-10-CM

## 2013-02-25 DIAGNOSIS — E538 Deficiency of other specified B group vitamins: Secondary | ICD-10-CM

## 2013-02-25 DIAGNOSIS — E559 Vitamin D deficiency, unspecified: Secondary | ICD-10-CM

## 2013-02-25 DIAGNOSIS — F3289 Other specified depressive episodes: Secondary | ICD-10-CM

## 2013-02-25 DIAGNOSIS — Z136 Encounter for screening for cardiovascular disorders: Secondary | ICD-10-CM

## 2013-02-25 DIAGNOSIS — F329 Major depressive disorder, single episode, unspecified: Secondary | ICD-10-CM

## 2013-02-25 LAB — URINALYSIS
Bilirubin Urine: NEGATIVE
Hgb urine dipstick: NEGATIVE
Ketones, ur: NEGATIVE
Leukocytes, UA: NEGATIVE
Nitrite: NEGATIVE
Specific Gravity, Urine: >=1.03 (ref 1.000–1.030)
Total Protein, Urine: NEGATIVE
Urine Glucose: NEGATIVE
Urobilinogen, UA: 0.2 (ref 0.0–1.0)
pH: 5.5 (ref 5.0–8.0)

## 2013-02-25 LAB — LIPID PANEL
Cholesterol: 141 mg/dL (ref 0–200)
HDL: 52.9 mg/dL (ref >39.00)
LDL Cholesterol: 64 mg/dL (ref 0–99)
Total CHOL/HDL Ratio: 3
Triglycerides: 119 mg/dL (ref 0.0–149.0)
VLDL: 23.8 mg/dL (ref 0.0–40.0)

## 2013-02-25 LAB — BASIC METABOLIC PANEL
BUN: 18 mg/dL (ref 6–23)
CO2: 27 mEq/L (ref 19–32)
Calcium: 9.6 mg/dL (ref 8.4–10.5)
Chloride: 105 mEq/L (ref 96–112)
Creatinine, Ser: 1.1 mg/dL (ref 0.4–1.5)
GFR: 70.33 mL/min (ref >60.00)
Glucose, Bld: 84 mg/dL (ref 70–99)
Potassium: 5 mEq/L (ref 3.5–5.1)
Sodium: 138 mEq/L (ref 135–145)

## 2013-02-25 LAB — CBC WITH DIFFERENTIAL/PLATELET
Basophils Absolute: 0 10*3/uL (ref 0.0–0.1)
Basophils Relative: 0.3 % (ref 0.0–3.0)
Eosinophils Absolute: 0.2 10*3/uL (ref 0.0–0.7)
Eosinophils Relative: 2.3 % (ref 0.0–5.0)
HCT: 35.7 % — ABNORMAL LOW (ref 39.0–52.0)
Hemoglobin: 12.1 g/dL — ABNORMAL LOW (ref 13.0–17.0)
Lymphocytes Relative: 27.7 % (ref 12.0–46.0)
Lymphs Abs: 1.9 10*3/uL (ref 0.7–4.0)
MCHC: 34 g/dL (ref 30.0–36.0)
MCV: 87.5 fl (ref 78.0–100.0)
Monocytes Absolute: 0.5 10*3/uL (ref 0.1–1.0)
Monocytes Relative: 7.4 % (ref 3.0–12.0)
Neutro Abs: 4.4 10*3/uL (ref 1.4–7.7)
Neutrophils Relative %: 62.3 % (ref 43.0–77.0)
Platelets: 244 10*3/uL (ref 150.0–400.0)
RBC: 4.08 Mil/uL — ABNORMAL LOW (ref 4.22–5.81)
RDW: 13.8 % (ref 11.5–14.6)
WBC: 7 10*3/uL (ref 4.5–10.5)

## 2013-02-25 LAB — HEPATIC FUNCTION PANEL
ALT: 12 U/L (ref 0–53)
AST: 20 U/L (ref 0–37)
Albumin: 4.2 g/dL (ref 3.5–5.2)
Alkaline Phosphatase: 52 U/L (ref 39–117)
Bilirubin, Direct: 0.1 mg/dL (ref 0.0–0.3)
Total Bilirubin: 0.5 mg/dL (ref 0.3–1.2)
Total Protein: 7 g/dL (ref 6.0–8.3)

## 2013-02-25 LAB — TSH: TSH: 2.18 u[IU]/mL (ref 0.35–5.50)

## 2013-02-25 LAB — PSA: PSA: 0.64 ng/mL (ref 0.10–4.00)

## 2013-02-25 LAB — VITAMIN B12: Vitamin B-12: >1500 pg/mL — ABNORMAL HIGH (ref 211–911)

## 2013-02-26 LAB — VITAMIN D 25 HYDROXY (VIT D DEFICIENCY, FRACTURES): Vit D, 25-Hydroxy: 50 ng/mL (ref 30–89)

## 2013-02-28 ENCOUNTER — Encounter: Payer: Self-pay | Admitting: Internal Medicine

## 2013-02-28 ENCOUNTER — Ambulatory Visit (INDEPENDENT_AMBULATORY_CARE_PROVIDER_SITE_OTHER): Payer: Medicare Other | Admitting: Internal Medicine

## 2013-02-28 VITALS — BP 138/78 | HR 64 | Resp 16 | Ht 72.0 in | Wt 183.0 lb

## 2013-02-28 DIAGNOSIS — Z0000 Encounter for general adult medical examination without abnormal findings: Secondary | ICD-10-CM | POA: Insufficient documentation

## 2013-02-28 DIAGNOSIS — G2581 Restless legs syndrome: Secondary | ICD-10-CM

## 2013-02-28 DIAGNOSIS — M545 Low back pain, unspecified: Secondary | ICD-10-CM

## 2013-02-28 DIAGNOSIS — E538 Deficiency of other specified B group vitamins: Secondary | ICD-10-CM

## 2013-02-28 DIAGNOSIS — F1021 Alcohol dependence, in remission: Secondary | ICD-10-CM | POA: Insufficient documentation

## 2013-02-28 DIAGNOSIS — R05 Cough: Secondary | ICD-10-CM

## 2013-02-28 DIAGNOSIS — N400 Enlarged prostate without lower urinary tract symptoms: Secondary | ICD-10-CM

## 2013-02-28 DIAGNOSIS — R059 Cough, unspecified: Secondary | ICD-10-CM

## 2013-02-28 DIAGNOSIS — F1020 Alcohol dependence, uncomplicated: Secondary | ICD-10-CM

## 2013-02-28 MED ORDER — HYDROCODONE-ACETAMINOPHEN 7.5-325 MG PO TABS: 1.0000 | ORAL_TABLET | Freq: Four times a day (QID) | ORAL | Status: DC | PRN

## 2013-02-28 MED ORDER — MIRABEGRON ER 50 MG PO TB24: 50.0000 mg | ORAL_TABLET | Freq: Every day | ORAL | Status: DC

## 2013-02-28 NOTE — Assessment & Plan Note (Signed)
Continue with current prescription therapy as reflected on the Med list.  

## 2013-02-28 NOTE — Progress Notes (Signed)
Subjective:     HPI  The patient is here for a wellness exam. The patient has been doing well overall without major physical or psychological issues going on lately.  The patient is here to follow up on chronic depression, anxiety, headaches and chronic moderate OA, LBP symptoms controlled with medicines. C/o urinating more often and dribbling - worse  Wt Readings from Last 3 Encounters:  02/28/13 183 lb (83.008 kg)  11/28/12 182 lb (82.555 kg)  08/29/12 177 lb (80.287 kg)   BP Readings from Last 3 Encounters:  02/28/13 138/78  11/28/12 130/80  08/29/12 130/94   Review of Systems  Constitutional: Positive for diaphoresis. Negative for chills, appetite change, fatigue and unexpected weight change.  HENT: Negative for nosebleeds, congestion, sore throat, sneezing, trouble swallowing and neck pain.   Eyes: Negative for pain, itching and visual disturbance.  Respiratory: Negative for cough.   Cardiovascular: Negative for chest pain, palpitations and leg swelling.  Gastrointestinal: Negative for nausea, vomiting, diarrhea, blood in stool and abdominal distention.  Endocrine: Negative for polydipsia and polyphagia.  Genitourinary: Positive for urgency, frequency, decreased urine volume and difficulty urinating. Negative for dysuria and hematuria.  Musculoskeletal: Positive for back pain and arthralgias. Negative for joint swelling and gait problem.  Skin: Negative for rash.  Neurological: Negative for dizziness, tremors, speech difficulty and weakness.  Psychiatric/Behavioral: Negative for suicidal ideas, sleep disturbance, dysphoric mood and agitation. The patient is not nervous/anxious.        Objective:   Physical Exam  Constitutional: He is oriented to person, place, and time. He appears well-developed.  HENT:  Mouth/Throat: Oropharynx is clear and moist.  Eyes: Conjunctivae are normal. Pupils are equal, round, and reactive to light.  Neck: Normal range of motion. No JVD  present. No thyromegaly present.  Cardiovascular: Normal rate, regular rhythm, normal heart sounds and intact distal pulses.  Exam reveals no gallop and no friction rub.   No murmur heard. Pulmonary/Chest: Effort normal and breath sounds normal. No respiratory distress. He has no wheezes. He has no rales. He exhibits no tenderness.  Abdominal: Soft. Bowel sounds are normal. He exhibits no distension and no mass. There is no tenderness. There is no rebound and no guarding.  Musculoskeletal: Normal range of motion. He exhibits tenderness (shoulders, LS. L palm is contracted). He exhibits no edema.  Lymphadenopathy:    He has no cervical adenopathy.  Neurological: He is alert and oriented to person, place, and time. He has normal reflexes. No cranial nerve deficit. He exhibits normal muscle tone. Coordination normal.  Skin: Skin is warm and dry. No rash noted.  Psychiatric: He has a normal mood and affect. His behavior is normal. Judgment and thought content normal.    Lab Results  Component Value Date   WBC 7.0 02/25/2013   HGB 12.1* 02/25/2013   HCT 35.7* 02/25/2013   PLT 244.0 02/25/2013   GLUCOSE 84 02/25/2013   CHOL 141 02/25/2013   TRIG 119.0 02/25/2013   HDL 52.90 02/25/2013   LDLCALC 64 02/25/2013   ALT 12 02/25/2013   AST 20 02/25/2013   NA 138 02/25/2013   K 5.0 02/25/2013   CL 105 02/25/2013   CREATININE 1.1 02/25/2013   BUN 18 02/25/2013   CO2 27 02/25/2013   TSH 2.18 02/25/2013   PSA 0.64 02/25/2013   HGBA1C  Value: 5.7 (NOTE)   The ADA recommends the following therapeutic goals for glycemic   control related to Hgb A1C measurement:   Goal of  Therapy:   < 7.0% Hgb A1C   Action Suggested:  > 8.0% Hgb A1C   Ref:  Diabetes Care, 22, Suppl. 1, 1999 12/24/2007         Assessment & Plan:

## 2013-02-28 NOTE — Assessment & Plan Note (Addendum)
Here for medicare wellness/physical  Diet: heart healthy  Physical activity: sedentary  Depression/mood screen: negative  Hearing: intact to whispered voice  Visual acuity: grossly normal, performs annual eye exam  ADLs: capable  Fall risk: none  Home safety: good  Cognitive evaluation: intact to orientation, naming, recall and repetition  EOL planning: adv directives, full code/ I agree  I have personally reviewed and have noted  1. The patient's medical and social history  2. Their use of alcohol, tobacco or illicit drugs  3. Their current medications and supplements  4. The patient's functional ability including ADL's, fall risks, home safety risks and hearing or visual impairment.  5. Diet and physical activities  6. Evidence for depression or mood disorders    Today patient counseled on age appropriate routine health concerns for screening and prevention, each reviewed and up to date or declined. Immunizations reviewed and up to date or declined. Labs ordered and reviewed. Risk factors for depression reviewed and negative. Hearing function and visual acuity are intact. ADLs screened and addressed as needed. Functional ability and level of safety reviewed and appropriate. Education, counseling and referrals performed based on assessed risks today. Patient provided with a copy of personalized plan for preventive services.  Colon -- Dr Janee Morn Flu shot next mo zostavax recommended

## 2013-02-28 NOTE — Assessment & Plan Note (Signed)
2014 wose Added Myrbetriq Urol ref

## 2013-02-28 NOTE — Assessment & Plan Note (Signed)
Dry x 18 years

## 2013-03-06 ENCOUNTER — Ambulatory Visit (INDEPENDENT_AMBULATORY_CARE_PROVIDER_SITE_OTHER)
Admission: RE | Admit: 2013-03-06 | Discharge: 2013-03-06 | Disposition: A | Discharge status: Home / Self Care | Payer: Medicare Other | Source: Ambulatory Visit | Attending: Internal Medicine | Admitting: Internal Medicine

## 2013-03-06 DIAGNOSIS — R059 Cough, unspecified: Secondary | ICD-10-CM

## 2013-03-06 DIAGNOSIS — R05 Cough: Secondary | ICD-10-CM

## 2013-03-18 ENCOUNTER — Telehealth: Payer: Self-pay | Admitting: *Deleted

## 2013-03-18 NOTE — Telephone Encounter (Signed)
Pt called requesting chest xray results from 9.4.2014.  Please advise

## 2013-03-19 NOTE — Telephone Encounter (Signed)
CXR was ok Thx 

## 2013-03-20 NOTE — Telephone Encounter (Signed)
Spoke with pt advised of MDs result note. 

## 2013-04-24 ENCOUNTER — Other Ambulatory Visit: Payer: Self-pay | Admitting: Internal Medicine

## 2013-05-07 ENCOUNTER — Ambulatory Visit (INDEPENDENT_AMBULATORY_CARE_PROVIDER_SITE_OTHER): Payer: Medicare Other

## 2013-05-07 DIAGNOSIS — Z23 Encounter for immunization: Secondary | ICD-10-CM

## 2013-05-25 ENCOUNTER — Other Ambulatory Visit: Payer: Self-pay | Admitting: Internal Medicine

## 2013-06-03 ENCOUNTER — Ambulatory Visit: Payer: Medicare Other | Admitting: Internal Medicine

## 2013-06-13 ENCOUNTER — Encounter: Payer: Self-pay | Admitting: Internal Medicine

## 2013-06-13 ENCOUNTER — Ambulatory Visit (INDEPENDENT_AMBULATORY_CARE_PROVIDER_SITE_OTHER): Payer: Medicare Other | Admitting: Internal Medicine

## 2013-06-13 VITALS — BP 148/84 | HR 64 | Temp 97.4°F | Resp 16 | Wt 188.0 lb

## 2013-06-13 DIAGNOSIS — R55 Syncope and collapse: Secondary | ICD-10-CM

## 2013-06-13 DIAGNOSIS — F3289 Other specified depressive episodes: Secondary | ICD-10-CM

## 2013-06-13 DIAGNOSIS — M545 Low back pain, unspecified: Secondary | ICD-10-CM

## 2013-06-13 DIAGNOSIS — E559 Vitamin D deficiency, unspecified: Secondary | ICD-10-CM

## 2013-06-13 DIAGNOSIS — Z23 Encounter for immunization: Secondary | ICD-10-CM

## 2013-06-13 DIAGNOSIS — E538 Deficiency of other specified B group vitamins: Secondary | ICD-10-CM

## 2013-06-13 DIAGNOSIS — F329 Major depressive disorder, single episode, unspecified: Secondary | ICD-10-CM

## 2013-06-13 MED ORDER — HYDROCODONE-ACETAMINOPHEN 7.5-325 MG PO TABS: 1.0000 | ORAL_TABLET | Freq: Four times a day (QID) | ORAL | Status: DC | PRN

## 2013-06-13 NOTE — Assessment & Plan Note (Signed)
Continue with current prescription therapy as reflected on the Med list.  

## 2013-06-13 NOTE — Assessment & Plan Note (Signed)
No relapse 

## 2013-06-13 NOTE — Progress Notes (Signed)
Pre visit review using our clinic review tool, if applicable. No additional management support is needed unless otherwise documented below in the visit note. 

## 2013-06-13 NOTE — Assessment & Plan Note (Signed)
   Milk free trial (no milk, ice cream, cheese and yogurt) for 4-6 weeks. OK to use almond, coconut, rice milk. "Almond breeze" brand tastes good.  

## 2013-06-15 ENCOUNTER — Encounter: Payer: Self-pay | Admitting: Internal Medicine

## 2013-09-12 ENCOUNTER — Ambulatory Visit: Payer: Medicare Other | Admitting: Internal Medicine

## 2013-09-18 ENCOUNTER — Encounter: Payer: Self-pay | Admitting: Internal Medicine

## 2013-09-18 ENCOUNTER — Ambulatory Visit (INDEPENDENT_AMBULATORY_CARE_PROVIDER_SITE_OTHER): Payer: Medicare Other | Admitting: Internal Medicine

## 2013-09-18 VITALS — BP 152/82 | HR 72 | Temp 96.6°F | Resp 16 | Wt 187.0 lb

## 2013-09-18 DIAGNOSIS — M545 Low back pain, unspecified: Secondary | ICD-10-CM

## 2013-09-18 DIAGNOSIS — F3289 Other specified depressive episodes: Secondary | ICD-10-CM

## 2013-09-18 DIAGNOSIS — F329 Major depressive disorder, single episode, unspecified: Secondary | ICD-10-CM

## 2013-09-18 DIAGNOSIS — F1020 Alcohol dependence, uncomplicated: Secondary | ICD-10-CM

## 2013-09-18 DIAGNOSIS — E538 Deficiency of other specified B group vitamins: Secondary | ICD-10-CM

## 2013-09-18 DIAGNOSIS — F1021 Alcohol dependence, in remission: Secondary | ICD-10-CM

## 2013-09-18 MED ORDER — HYDROCODONE-ACETAMINOPHEN 7.5-325 MG PO TABS: 1.0000 | ORAL_TABLET | Freq: Four times a day (QID) | ORAL | Status: DC | PRN

## 2013-09-18 NOTE — Assessment & Plan Note (Signed)
Dry x 19 years

## 2013-09-18 NOTE — Assessment & Plan Note (Signed)
Continue with current prescription therapy as reflected on the Med list.  

## 2013-09-18 NOTE — Progress Notes (Signed)
Pre visit review using our clinic review tool, if applicable. No additional management support is needed unless otherwise documented below in the visit note. 

## 2013-09-18 NOTE — Assessment & Plan Note (Signed)
.  .  .        xx

## 2013-09-18 NOTE — Progress Notes (Signed)
Subjective:     HPI   The patient is here to follow up on chronic depression, anxiety, headaches and chronic moderate OA, LBP symptoms controlled with medicines. He was dx'd w/macular degeneration   Wt Readings from Last 3 Encounters:  09/18/13 187 lb (84.823 kg)  06/13/13 188 lb (85.276 kg)  02/28/13 183 lb (83.008 kg)   BP Readings from Last 3 Encounters:  09/18/13 152/82  06/13/13 148/84  02/28/13 138/78   Review of Systems  Constitutional: Positive for diaphoresis. Negative for chills, appetite change, fatigue and unexpected weight change.  HENT: Negative for congestion, nosebleeds, sneezing, sore throat and trouble swallowing.   Eyes: Negative for pain, itching and visual disturbance.  Respiratory: Negative for cough.   Cardiovascular: Negative for chest pain, palpitations and leg swelling.  Gastrointestinal: Negative for nausea, vomiting, diarrhea, blood in stool and abdominal distention.  Endocrine: Negative for polydipsia and polyphagia.  Genitourinary: Positive for urgency, frequency, decreased urine volume and difficulty urinating. Negative for dysuria and hematuria.  Musculoskeletal: Positive for arthralgias and back pain. Negative for gait problem, joint swelling and neck pain.  Skin: Negative for rash.  Neurological: Negative for dizziness, tremors, speech difficulty and weakness.  Psychiatric/Behavioral: Negative for suicidal ideas, sleep disturbance, dysphoric mood and agitation. The patient is not nervous/anxious.        Objective:   Physical Exam  Constitutional: He is oriented to person, place, and time. He appears well-developed.  HENT:  Mouth/Throat: Oropharynx is clear and moist.  Eyes: Conjunctivae are normal. Pupils are equal, round, and reactive to light.  Neck: Normal range of motion. No JVD present. No thyromegaly present.  Cardiovascular: Normal rate, regular rhythm, normal heart sounds and intact distal pulses.  Exam reveals no gallop and no  friction rub.   No murmur heard. Pulmonary/Chest: Effort normal and breath sounds normal. No respiratory distress. He has no wheezes. He has no rales. He exhibits no tenderness.  Abdominal: Soft. Bowel sounds are normal. He exhibits no distension and no mass. There is no tenderness. There is no rebound and no guarding.  Musculoskeletal: Normal range of motion. He exhibits tenderness (shoulders, LS. L palm is contracted). He exhibits no edema.  Lymphadenopathy:    He has no cervical adenopathy.  Neurological: He is alert and oriented to person, place, and time. He has normal reflexes. No cranial nerve deficit. He exhibits normal muscle tone. Coordination normal.  Skin: Skin is warm and dry. No rash noted.  Psychiatric: He has a normal mood and affect. His behavior is normal. Judgment and thought content normal.    Lab Results  Component Value Date   WBC 7.0 02/25/2013   HGB 12.1* 02/25/2013   HCT 35.7* 02/25/2013   PLT 244.0 02/25/2013   GLUCOSE 84 02/25/2013   CHOL 141 02/25/2013   TRIG 119.0 02/25/2013   HDL 52.90 02/25/2013   LDLCALC 64 02/25/2013   ALT 12 02/25/2013   AST 20 02/25/2013   NA 138 02/25/2013   K 5.0 02/25/2013   CL 105 02/25/2013   CREATININE 1.1 02/25/2013   BUN 18 02/25/2013   CO2 27 02/25/2013   TSH 2.18 02/25/2013   PSA 0.64 02/25/2013   HGBA1C  Value: 5.7 (NOTE)   The ADA recommends the following therapeutic goals for glycemic   control related to Hgb A1C measurement:   Goal of Therapy:   < 7.0% Hgb A1C   Action Suggested:  > 8.0% Hgb A1C   Ref:  Diabetes Care, 22, Suppl. 1, 1999  12/24/2007         Assessment & Plan:

## 2013-12-09 ENCOUNTER — Other Ambulatory Visit: Payer: Self-pay | Admitting: Internal Medicine

## 2013-12-10 ENCOUNTER — Telehealth: Payer: Self-pay

## 2013-12-10 NOTE — Telephone Encounter (Signed)
Stay on what you are on now Thx

## 2013-12-10 NOTE — Telephone Encounter (Signed)
Pharmacy had questions in regards to the dose of the vitamin b12 sublingual tab that you prescribed. You ordered b 12 1000 mg sub-ling bid, pt normally takes 500 mg sub-ling bid.  Do you want pt to increase to 1000 mcg or stay at 500 mcg? Please advise

## 2013-12-11 NOTE — Telephone Encounter (Signed)
Pharmacy notified.

## 2013-12-24 ENCOUNTER — Ambulatory Visit (INDEPENDENT_AMBULATORY_CARE_PROVIDER_SITE_OTHER): Payer: Medicare Other | Admitting: Internal Medicine

## 2013-12-24 ENCOUNTER — Encounter: Payer: Self-pay | Admitting: Internal Medicine

## 2013-12-24 VITALS — BP 138/80 | HR 72 | Temp 97.6°F | Resp 16 | Wt 185.0 lb

## 2013-12-24 DIAGNOSIS — M545 Low back pain, unspecified: Secondary | ICD-10-CM

## 2013-12-24 MED ORDER — HYDROCODONE-ACETAMINOPHEN 7.5-325 MG PO TABS: 1.0000 | ORAL_TABLET | Freq: Four times a day (QID) | ORAL | Status: DC | PRN

## 2013-12-24 NOTE — Progress Notes (Signed)
Pre visit review using our clinic review tool, if applicable. No additional management support is needed unless otherwise documented below in the visit note. 

## 2013-12-24 NOTE — Progress Notes (Signed)
Subjective:     HPI   The patient is here to follow up on chronic depression, anxiety, headaches and chronic moderate OA, LBP symptoms controlled with medicines.   He was dx'd w/macular degeneration   Wt Readings from Last 3 Encounters:  12/24/13 185 lb (83.915 kg)  09/18/13 187 lb (84.823 kg)  06/13/13 188 lb (85.276 kg)   BP Readings from Last 3 Encounters:  12/24/13 138/80  09/18/13 152/82  06/13/13 148/84   Review of Systems  Constitutional: Positive for diaphoresis. Negative for chills, appetite change, fatigue and unexpected weight change.  HENT: Negative for congestion, nosebleeds, sneezing, sore throat and trouble swallowing.   Eyes: Negative for pain, itching and visual disturbance.  Respiratory: Negative for cough.   Cardiovascular: Negative for chest pain, palpitations and leg swelling.  Gastrointestinal: Negative for nausea, vomiting, diarrhea, blood in stool and abdominal distention.  Endocrine: Negative for polydipsia and polyphagia.  Genitourinary: Positive for urgency, frequency, decreased urine volume and difficulty urinating. Negative for dysuria and hematuria.  Musculoskeletal: Positive for arthralgias and back pain. Negative for gait problem, joint swelling and neck pain.  Skin: Negative for rash.  Neurological: Negative for dizziness, tremors, speech difficulty and weakness.  Psychiatric/Behavioral: Negative for suicidal ideas, sleep disturbance, dysphoric mood and agitation. The patient is not nervous/anxious.        Objective:   Physical Exam  Constitutional: He is oriented to person, place, and time. He appears well-developed.  HENT:  Mouth/Throat: Oropharynx is clear and moist.  Eyes: Conjunctivae are normal. Pupils are equal, round, and reactive to light.  Neck: Normal range of motion. No JVD present. No thyromegaly present.  Cardiovascular: Normal rate, regular rhythm, normal heart sounds and intact distal pulses.  Exam reveals no gallop and  no friction rub.   No murmur heard. Pulmonary/Chest: Effort normal and breath sounds normal. No respiratory distress. He has no wheezes. He has no rales. He exhibits no tenderness.  Abdominal: Soft. Bowel sounds are normal. He exhibits no distension and no mass. There is no tenderness. There is no rebound and no guarding.  Musculoskeletal: Normal range of motion. He exhibits tenderness (shoulders, LS. L palm is contracted). He exhibits no edema.  Lymphadenopathy:    He has no cervical adenopathy.  Neurological: He is alert and oriented to person, place, and time. He has normal reflexes. No cranial nerve deficit. He exhibits normal muscle tone. Coordination normal.  Skin: Skin is warm and dry. No rash noted.  Psychiatric: He has a normal mood and affect. His behavior is normal. Judgment and thought content normal.    Lab Results  Component Value Date   WBC 7.0 02/25/2013   HGB 12.1* 02/25/2013   HCT 35.7* 02/25/2013   PLT 244.0 02/25/2013   GLUCOSE 84 02/25/2013   CHOL 141 02/25/2013   TRIG 119.0 02/25/2013   HDL 52.90 02/25/2013   LDLCALC 64 02/25/2013   ALT 12 02/25/2013   AST 20 02/25/2013   NA 138 02/25/2013   K 5.0 02/25/2013   CL 105 02/25/2013   CREATININE 1.1 02/25/2013   BUN 18 02/25/2013   CO2 27 02/25/2013   TSH 2.18 02/25/2013   PSA 0.64 02/25/2013   HGBA1C  Value: 5.7 (NOTE)   The ADA recommends the following therapeutic goals for glycemic   control related to Hgb A1C measurement:   Goal of Therapy:   < 7.0% Hgb A1C   Action Suggested:  > 8.0% Hgb A1C   Ref:  Diabetes Care, 22, Suppl.  1, 1999 12/24/2007         Assessment & Plan:

## 2014-01-20 ENCOUNTER — Other Ambulatory Visit: Payer: Self-pay | Admitting: Internal Medicine

## 2014-04-07 ENCOUNTER — Other Ambulatory Visit (INDEPENDENT_AMBULATORY_CARE_PROVIDER_SITE_OTHER): Payer: Medicare Other

## 2014-04-07 ENCOUNTER — Encounter: Payer: Self-pay | Admitting: Internal Medicine

## 2014-04-07 ENCOUNTER — Ambulatory Visit (INDEPENDENT_AMBULATORY_CARE_PROVIDER_SITE_OTHER): Payer: Medicare Other | Admitting: Internal Medicine

## 2014-04-07 VITALS — BP 136/84 | HR 59 | Temp 97.5°F | Resp 18 | Ht 72.0 in | Wt 186.6 lb

## 2014-04-07 DIAGNOSIS — Z23 Encounter for immunization: Secondary | ICD-10-CM

## 2014-04-07 DIAGNOSIS — Z79899 Other long term (current) drug therapy: Secondary | ICD-10-CM

## 2014-04-07 DIAGNOSIS — R7989 Other specified abnormal findings of blood chemistry: Secondary | ICD-10-CM

## 2014-04-07 DIAGNOSIS — R946 Abnormal results of thyroid function studies: Secondary | ICD-10-CM

## 2014-04-07 DIAGNOSIS — Z0000 Encounter for general adult medical examination without abnormal findings: Secondary | ICD-10-CM

## 2014-04-07 DIAGNOSIS — N320 Bladder-neck obstruction: Secondary | ICD-10-CM

## 2014-04-07 DIAGNOSIS — E538 Deficiency of other specified B group vitamins: Secondary | ICD-10-CM

## 2014-04-07 DIAGNOSIS — R1030 Lower abdominal pain, unspecified: Secondary | ICD-10-CM | POA: Insufficient documentation

## 2014-04-07 DIAGNOSIS — Z125 Encounter for screening for malignant neoplasm of prostate: Secondary | ICD-10-CM

## 2014-04-07 DIAGNOSIS — M545 Low back pain, unspecified: Secondary | ICD-10-CM

## 2014-04-07 DIAGNOSIS — E559 Vitamin D deficiency, unspecified: Secondary | ICD-10-CM

## 2014-04-07 DIAGNOSIS — G4700 Insomnia, unspecified: Secondary | ICD-10-CM

## 2014-04-07 LAB — URINALYSIS
Bilirubin Urine: NEGATIVE
Hgb urine dipstick: NEGATIVE
Ketones, ur: NEGATIVE
Leukocytes, UA: NEGATIVE
Nitrite: NEGATIVE
Specific Gravity, Urine: 1.025 (ref 1.000–1.030)
Total Protein, Urine: NEGATIVE
Urine Glucose: NEGATIVE
Urobilinogen, UA: 0.2 (ref 0.0–1.0)
pH: 5.5 (ref 5.0–8.0)

## 2014-04-07 LAB — CBC WITH DIFFERENTIAL/PLATELET
Basophils Absolute: 0 10*3/uL (ref 0.0–0.1)
Basophils Relative: 0.2 % (ref 0.0–3.0)
Eosinophils Absolute: 0.3 10*3/uL (ref 0.0–0.7)
Eosinophils Relative: 4.1 % (ref 0.0–5.0)
HCT: 34.3 % — ABNORMAL LOW (ref 39.0–52.0)
Hemoglobin: 11.8 g/dL — ABNORMAL LOW (ref 13.0–17.0)
Lymphocytes Relative: 26.6 % (ref 12.0–46.0)
Lymphs Abs: 1.8 10*3/uL (ref 0.7–4.0)
MCHC: 34.3 g/dL (ref 30.0–36.0)
MCV: 86.7 fl (ref 78.0–100.0)
Monocytes Absolute: 0.6 10*3/uL (ref 0.1–1.0)
Monocytes Relative: 9 % (ref 3.0–12.0)
Neutro Abs: 4.1 10*3/uL (ref 1.4–7.7)
Neutrophils Relative %: 60.1 % (ref 43.0–77.0)
Platelets: 225 10*3/uL (ref 150.0–400.0)
RBC: 3.96 Mil/uL — ABNORMAL LOW (ref 4.22–5.81)
RDW: 13.9 % (ref 11.5–15.5)
WBC: 6.8 10*3/uL (ref 4.0–10.5)

## 2014-04-07 MED ORDER — HYDROCODONE-ACETAMINOPHEN 7.5-325 MG PO TABS: 1.0000 | ORAL_TABLET | Freq: Four times a day (QID) | ORAL | Status: DC | PRN

## 2014-04-07 NOTE — Assessment & Plan Note (Signed)
Continue with current prescription therapy as reflected on the Med list.  

## 2014-04-07 NOTE — Assessment & Plan Note (Signed)

## 2014-04-07 NOTE — Progress Notes (Signed)
Pre visit review using our clinic review tool, if applicable. No additional management support is needed unless otherwise documented below in the visit note. 

## 2014-04-07 NOTE — Assessment & Plan Note (Signed)
Labs We could use empric Cipro

## 2014-04-07 NOTE — Progress Notes (Signed)
Subjective:     HPI The patient is here for a wellness exam.   C/o abd pain/gas irrad to R testicle The patient is here to follow up on chronic depression, anxiety, headaches and chronic moderate OA, LBP symptoms controlled with medicines.   He was dx'd w/macular degeneration   Wt Readings from Last 3 Encounters:  04/07/14 186 lb 9.6 oz (84.641 kg)  12/24/13 185 lb (83.915 kg)  09/18/13 187 lb (84.823 kg)   BP Readings from Last 3 Encounters:  04/07/14 136/84  12/24/13 138/80  09/18/13 152/82   Review of Systems  Constitutional: Positive for diaphoresis. Negative for chills, appetite change, fatigue and unexpected weight change.  HENT: Negative for congestion, nosebleeds, sneezing, sore throat and trouble swallowing.   Eyes: Negative for pain, itching and visual disturbance.  Respiratory: Negative for cough.   Cardiovascular: Negative for chest pain, palpitations and leg swelling.  Gastrointestinal: Negative for nausea, vomiting, diarrhea, blood in stool and abdominal distention.  Endocrine: Negative for polydipsia and polyphagia.  Genitourinary: Positive for urgency, frequency, decreased urine volume and difficulty urinating. Negative for dysuria and hematuria.  Musculoskeletal: Positive for arthralgias and back pain. Negative for gait problem, joint swelling and neck pain.  Skin: Negative for rash.  Neurological: Negative for dizziness, tremors, speech difficulty and weakness.  Psychiatric/Behavioral: Negative for suicidal ideas, sleep disturbance, dysphoric mood and agitation. The patient is not nervous/anxious.        Objective:   Physical Exam  Constitutional: He is oriented to person, place, and time. He appears well-developed.  HENT:  Mouth/Throat: Oropharynx is clear and moist.  Eyes: Conjunctivae are normal. Pupils are equal, round, and reactive to light.  Neck: Normal range of motion. No JVD present. No thyromegaly present.  Cardiovascular: Normal rate,  regular rhythm, normal heart sounds and intact distal pulses.  Exam reveals no gallop and no friction rub.   No murmur heard. Pulmonary/Chest: Effort normal and breath sounds normal. No respiratory distress. He has no wheezes. He has no rales. He exhibits no tenderness.  Abdominal: Soft. Bowel sounds are normal. He exhibits no distension and no mass. There is no tenderness. There is no rebound and no guarding.  Musculoskeletal: Normal range of motion. He exhibits tenderness (shoulders, LS. L palm is contracted). He exhibits no edema.  Lymphadenopathy:    He has no cervical adenopathy.  Neurological: He is alert and oriented to person, place, and time. He has normal reflexes. No cranial nerve deficit. He exhibits normal muscle tone. Coordination normal.  Skin: Skin is warm and dry. No rash noted.  Psychiatric: He has a normal mood and affect. His behavior is normal. Judgment and thought content normal.  Prostate 2+ Testes ok B G(-) stool  Lab Results  Component Value Date   WBC 7.0 02/25/2013   HGB 12.1* 02/25/2013   HCT 35.7* 02/25/2013   PLT 244.0 02/25/2013   GLUCOSE 84 02/25/2013   CHOL 141 02/25/2013   TRIG 119.0 02/25/2013   HDL 52.90 02/25/2013   LDLCALC 64 02/25/2013   ALT 12 02/25/2013   AST 20 02/25/2013   NA 138 02/25/2013   K 5.0 02/25/2013   CL 105 02/25/2013   CREATININE 1.1 02/25/2013   BUN 18 02/25/2013   CO2 27 02/25/2013   TSH 2.18 02/25/2013   PSA 0.64 02/25/2013   HGBA1C  Value: 5.7 (NOTE)   The ADA recommends the following therapeutic goals for glycemic   control related to Hgb A1C measurement:   Goal of Therapy:   <  7.0% Hgb A1C   Action Suggested:  > 8.0% Hgb A1C   Ref:  Diabetes Care, 22, Suppl. 1, 1999 12/24/2007         Assessment & Plan:

## 2014-04-08 LAB — BASIC METABOLIC PANEL
BUN: 18 mg/dL (ref 6–23)
CO2: 23 mEq/L (ref 19–32)
Calcium: 9.4 mg/dL (ref 8.4–10.5)
Chloride: 107 mEq/L (ref 96–112)
Creatinine, Ser: 1.1 mg/dL (ref 0.4–1.5)
GFR: 72.41 mL/min (ref >60.00)
Glucose, Bld: 81 mg/dL (ref 70–99)
Potassium: 4.7 mEq/L (ref 3.5–5.1)
Sodium: 141 mEq/L (ref 135–145)

## 2014-04-08 LAB — TSH: TSH: 7.29 u[IU]/mL — ABNORMAL HIGH (ref 0.35–4.50)

## 2014-04-08 LAB — LIPID PANEL
Cholesterol: 156 mg/dL (ref 0–200)
HDL: 47.2 mg/dL (ref >39.00)
LDL Cholesterol: 100 mg/dL — ABNORMAL HIGH (ref 0–99)
NonHDL: 108.8
Total CHOL/HDL Ratio: 3
Triglycerides: 44 mg/dL (ref 0.0–149.0)
VLDL: 8.8 mg/dL (ref 0.0–40.0)

## 2014-04-08 LAB — HEPATIC FUNCTION PANEL
ALT: 12 U/L (ref 0–53)
AST: 18 U/L (ref 0–37)
Albumin: 3.9 g/dL (ref 3.5–5.2)
Alkaline Phosphatase: 52 U/L (ref 39–117)
Bilirubin, Direct: 0.1 mg/dL (ref 0.0–0.3)
Total Bilirubin: 0.5 mg/dL (ref 0.2–1.2)
Total Protein: 7.2 g/dL (ref 6.0–8.3)

## 2014-04-08 LAB — PSA: PSA: 0.9 ng/mL (ref 0.10–4.00)

## 2014-04-09 ENCOUNTER — Encounter: Payer: Self-pay | Admitting: Internal Medicine

## 2014-04-09 DIAGNOSIS — R7989 Other specified abnormal findings of blood chemistry: Secondary | ICD-10-CM | POA: Insufficient documentation

## 2014-04-09 NOTE — Assessment & Plan Note (Signed)
Repeat labs:

## 2014-04-21 ENCOUNTER — Encounter: Payer: Self-pay | Admitting: Internal Medicine

## 2014-05-24 ENCOUNTER — Other Ambulatory Visit: Payer: Self-pay | Admitting: Internal Medicine

## 2014-06-04 ENCOUNTER — Telehealth: Payer: Self-pay | Admitting: *Deleted

## 2014-06-04 NOTE — Telephone Encounter (Signed)
Pt left msg on triage requesting call bck. Been feeling tired wanted to know is it related to his thyroid. Called pt back pt had labs done back in Oct & TSH was abnormal. Inform pt by his TSH been underactive it could cause symptoms of feeling tired/sluggish. MD want to recheck TSH prior to appt 07/10/14. Inform pt if he want to move appt up we can or if he can wait until January appt he can. Did advise md has already place TSH labs need to have done prior to appt. Pt states he will try & wait until January appt, but if sxs get worse will call back...Justin [name]

## 2014-06-09 ENCOUNTER — Other Ambulatory Visit: Payer: Self-pay | Admitting: Internal Medicine

## 2014-07-01 ENCOUNTER — Ambulatory Visit: Payer: Medicare Other | Admitting: Internal Medicine

## 2014-07-10 ENCOUNTER — Ambulatory Visit: Payer: Medicare Other | Admitting: Internal Medicine

## 2014-07-20 ENCOUNTER — Other Ambulatory Visit: Payer: Self-pay | Admitting: Internal Medicine

## 2014-07-21 ENCOUNTER — Other Ambulatory Visit (INDEPENDENT_AMBULATORY_CARE_PROVIDER_SITE_OTHER): Payer: Medicare Other

## 2014-07-21 ENCOUNTER — Ambulatory Visit (INDEPENDENT_AMBULATORY_CARE_PROVIDER_SITE_OTHER): Payer: Medicare Other | Admitting: Internal Medicine

## 2014-07-21 ENCOUNTER — Encounter: Payer: Self-pay | Admitting: Internal Medicine

## 2014-07-21 VITALS — BP 150/80 | HR 70 | Wt 186.0 lb

## 2014-07-21 DIAGNOSIS — M545 Low back pain, unspecified: Secondary | ICD-10-CM

## 2014-07-21 DIAGNOSIS — R946 Abnormal results of thyroid function studies: Secondary | ICD-10-CM | POA: Diagnosis not present

## 2014-07-21 DIAGNOSIS — M1990 Unspecified osteoarthritis, unspecified site: Secondary | ICD-10-CM

## 2014-07-21 DIAGNOSIS — E559 Vitamin D deficiency, unspecified: Secondary | ICD-10-CM

## 2014-07-21 DIAGNOSIS — R7989 Other specified abnormal findings of blood chemistry: Secondary | ICD-10-CM

## 2014-07-21 LAB — T4, FREE: Free T4: 0.99 ng/dL (ref 0.60–1.60)

## 2014-07-21 LAB — TSH: TSH: 5.24 u[IU]/mL — ABNORMAL HIGH (ref 0.35–4.50)

## 2014-07-21 MED ORDER — HYDROCODONE-ACETAMINOPHEN 7.5-325 MG PO TABS: 1.0000 | ORAL_TABLET | Freq: Four times a day (QID) | ORAL | Status: DC | PRN

## 2014-07-21 NOTE — Progress Notes (Signed)
Subjective:      The patient is here to follow up on chronic depression, anxiety, headaches and chronic moderate OA, LBP symptoms controlled with medicines.   He was dx'd w/macular degeneration   Wt Readings from Last 3 Encounters:  07/21/14 186 lb (84.369 kg)  04/07/14 186 lb 9.6 oz (84.641 kg)  12/24/13 185 lb (83.915 kg)   BP Readings from Last 3 Encounters:  07/21/14 150/80  04/07/14 136/84  12/24/13 138/80   Review of Systems  Constitutional: Positive for diaphoresis. Negative for chills, appetite change, fatigue and unexpected weight change.  HENT: Negative for congestion, nosebleeds, sneezing, sore throat and trouble swallowing.   Eyes: Negative for pain, itching and visual disturbance.  Respiratory: Negative for cough.   Cardiovascular: Negative for chest pain, palpitations and leg swelling.  Gastrointestinal: Negative for nausea, vomiting, diarrhea, blood in stool and abdominal distention.  Endocrine: Negative for polydipsia and polyphagia.  Genitourinary: Positive for urgency, frequency, decreased urine volume and difficulty urinating. Negative for dysuria and hematuria.  Musculoskeletal: Positive for arthralgias and back pain. Negative for gait problem, joint swelling and neck pain.  Skin: Negative for rash.  Neurological: Negative for dizziness, tremors, speech difficulty and weakness.  Psychiatric/Behavioral: Negative for suicidal ideas, sleep disturbance, dysphoric mood and agitation. The patient is not nervous/anxious.        Objective:   Physical Exam  Constitutional: He is oriented to person, place, and time. He appears well-developed.  HENT:  Mouth/Throat: Oropharynx is clear and moist.  Eyes: Conjunctivae are normal. Pupils are equal, round, and reactive to light.  Neck: Normal range of motion. No JVD present. No thyromegaly present.  Cardiovascular: Normal rate, regular rhythm, normal heart sounds and intact distal pulses.  Exam reveals no gallop and  no friction rub.   No murmur heard. Pulmonary/Chest: Effort normal and breath sounds normal. No respiratory distress. He has no wheezes. He has no rales. He exhibits no tenderness.  Abdominal: Soft. Bowel sounds are normal. He exhibits no distension and no mass. There is no tenderness. There is no rebound and no guarding.  Musculoskeletal: Normal range of motion. He exhibits tenderness (shoulders, LS. L palm is contracted). He exhibits no edema.  Lymphadenopathy:    He has no cervical adenopathy.  Neurological: He is alert and oriented to person, place, and time. He has normal reflexes. No cranial nerve deficit. He exhibits normal muscle tone. Coordination normal.  Skin: Skin is warm and dry. No rash noted.  Psychiatric: He has a normal mood and affect. His behavior is normal. Judgment and thought content normal.  Prostate 2+ Testes ok B G(-) stool  Lab Results  Component Value Date   WBC 6.8 04/07/2014   HGB 11.8* 04/07/2014   HCT 34.3* 04/07/2014   PLT 225.0 04/07/2014   GLUCOSE 81 04/07/2014   CHOL 156 04/07/2014   TRIG 44.0 04/07/2014   HDL 47.20 04/07/2014   LDLCALC 100* 04/07/2014   ALT 12 04/07/2014   AST 18 04/07/2014   NA 141 04/07/2014   K 4.7 04/07/2014   CL 107 04/07/2014   CREATININE 1.1 04/07/2014   BUN 18 04/07/2014   CO2 23 04/07/2014   TSH 7.29* 04/07/2014   PSA 0.90 04/07/2014   HGBA1C  12/24/2007    5.7 (NOTE)   The ADA recommends the following therapeutic goals for glycemic   control related to Hgb A1C measurement:   Goal of Therapy:   < 7.0% Hgb A1C   Action Suggested:  > 8.0%  Hgb A1C   Ref:  Diabetes Care, 22, Suppl. 1, 1999         Assessment & Plan:

## 2014-07-21 NOTE — Assessment & Plan Note (Signed)
Continue with current prescription therapy as reflected on the Med list.  

## 2014-07-21 NOTE — Assessment & Plan Note (Signed)
Chronic  Potential benefits of a long term opioids use as well as potential risks (i.e. addiction risk, apnea etc) and complications (i.e. Somnolence, constipation and others) were explained to the patient and were aknowledged. Continue with current prescription therapy as reflected on the Med list.

## 2014-08-07 DIAGNOSIS — L821 Other seborrheic keratosis: Secondary | ICD-10-CM | POA: Diagnosis not present

## 2014-08-07 DIAGNOSIS — D225 Melanocytic nevi of trunk: Secondary | ICD-10-CM | POA: Diagnosis not present

## 2014-08-07 DIAGNOSIS — L570 Actinic keratosis: Secondary | ICD-10-CM | POA: Diagnosis not present

## 2014-08-07 DIAGNOSIS — D2371 Other benign neoplasm of skin of right lower limb, including hip: Secondary | ICD-10-CM | POA: Diagnosis not present

## 2014-08-07 DIAGNOSIS — Z85828 Personal history of other malignant neoplasm of skin: Secondary | ICD-10-CM | POA: Diagnosis not present

## 2014-10-09 ENCOUNTER — Telehealth: Payer: Self-pay | Admitting: Internal Medicine

## 2014-10-09 NOTE — Telephone Encounter (Signed)
Patient wanted to know if you called him

## 2014-10-09 NOTE — Telephone Encounter (Signed)
I have not called him. However, I did just try him on his home number to inform him. No answer /unable to leave vm.

## 2014-10-21 ENCOUNTER — Ambulatory Visit (INDEPENDENT_AMBULATORY_CARE_PROVIDER_SITE_OTHER): Payer: Medicare Other | Admitting: Internal Medicine

## 2014-10-21 ENCOUNTER — Encounter: Payer: Self-pay | Admitting: Internal Medicine

## 2014-10-21 VITALS — BP 134/78 | HR 55 | Wt 183.0 lb

## 2014-10-21 DIAGNOSIS — F32A Depression, unspecified: Secondary | ICD-10-CM

## 2014-10-21 DIAGNOSIS — M545 Low back pain, unspecified: Secondary | ICD-10-CM

## 2014-10-21 DIAGNOSIS — R7989 Other specified abnormal findings of blood chemistry: Secondary | ICD-10-CM

## 2014-10-21 DIAGNOSIS — M5440 Lumbago with sciatica, unspecified side: Secondary | ICD-10-CM | POA: Diagnosis not present

## 2014-10-21 DIAGNOSIS — E538 Deficiency of other specified B group vitamins: Secondary | ICD-10-CM

## 2014-10-21 DIAGNOSIS — R946 Abnormal results of thyroid function studies: Secondary | ICD-10-CM

## 2014-10-21 DIAGNOSIS — F329 Major depressive disorder, single episode, unspecified: Secondary | ICD-10-CM | POA: Diagnosis not present

## 2014-10-21 MED ORDER — HYDROCODONE-ACETAMINOPHEN 7.5-325 MG PO TABS: 1.0000 | ORAL_TABLET | Freq: Four times a day (QID) | ORAL | Status: DC | PRN

## 2014-10-21 NOTE — Progress Notes (Signed)
Pre visit review using our clinic review tool, if applicable. No additional management support is needed unless otherwise documented below in the visit note. 

## 2014-10-21 NOTE — Progress Notes (Signed)
Subjective:     HPI    The patient is here to follow up on chronic depression, anxiety, headaches and chronic moderate OA, LBP symptoms controlled with medicines.   He was dx'd w/macular degeneration   Wt Readings from Last 3 Encounters:  10/21/14 183 lb (83.008 kg)  07/21/14 186 lb (84.369 kg)  04/07/14 186 lb 9.6 oz (84.641 kg)   BP Readings from Last 3 Encounters:  10/21/14 134/78  07/21/14 150/80  04/07/14 136/84   Review of Systems  Constitutional: Positive for diaphoresis. Negative for chills, appetite change, fatigue and unexpected weight change.  HENT: Negative for congestion, nosebleeds, sneezing, sore throat and trouble swallowing.   Eyes: Negative for pain, itching and visual disturbance.  Respiratory: Negative for cough.   Cardiovascular: Negative for chest pain, palpitations and leg swelling.  Gastrointestinal: Negative for nausea, vomiting, diarrhea, blood in stool and abdominal distention.  Endocrine: Negative for polydipsia and polyphagia.  Genitourinary: Positive for urgency, frequency, decreased urine volume and difficulty urinating. Negative for dysuria and hematuria.  Musculoskeletal: Positive for back pain and arthralgias. Negative for joint swelling, gait problem and neck pain.  Skin: Negative for rash.  Neurological: Negative for dizziness, tremors, speech difficulty and weakness.  Psychiatric/Behavioral: Negative for suicidal ideas, sleep disturbance, dysphoric mood and agitation. The patient is not nervous/anxious.        Objective:   Physical Exam  Constitutional: He is oriented to person, place, and time. He appears well-developed.  HENT:  Mouth/Throat: Oropharynx is clear and moist.  Eyes: Conjunctivae are normal. Pupils are equal, round, and reactive to light.  Neck: Normal range of motion. No JVD present. No thyromegaly present.  Cardiovascular: Normal rate, regular rhythm, normal heart sounds and intact distal pulses.  Exam reveals no  gallop and no friction rub.   No murmur heard. Pulmonary/Chest: Effort normal and breath sounds normal. No respiratory distress. He has no wheezes. He has no rales. He exhibits no tenderness.  Abdominal: Soft. Bowel sounds are normal. He exhibits no distension and no mass. There is no tenderness. There is no rebound and no guarding.  Musculoskeletal: Normal range of motion. He exhibits tenderness (shoulders, LS. L palm is contracted). He exhibits no edema.  Lymphadenopathy:    He has no cervical adenopathy.  Neurological: He is alert and oriented to person, place, and time. He has normal reflexes. No cranial nerve deficit. He exhibits normal muscle tone. Coordination normal.  Skin: Skin is warm and dry. No rash noted.  Psychiatric: He has a normal mood and affect. His behavior is normal. Judgment and thought content normal.    Lab Results  Component Value Date   WBC 6.8 04/07/2014   HGB 11.8* 04/07/2014   HCT 34.3* 04/07/2014   PLT 225.0 04/07/2014   GLUCOSE 81 04/07/2014   CHOL 156 04/07/2014   TRIG 44.0 04/07/2014   HDL 47.20 04/07/2014   LDLCALC 100* 04/07/2014   ALT 12 04/07/2014   AST 18 04/07/2014   NA 141 04/07/2014   K 4.7 04/07/2014   CL 107 04/07/2014   CREATININE 1.1 04/07/2014   BUN 18 04/07/2014   CO2 23 04/07/2014   TSH 5.24* 07/21/2014   PSA 0.90 04/07/2014   HGBA1C  12/24/2007    5.7 (NOTE)   The ADA recommends the following therapeutic goals for glycemic   control related to Hgb A1C measurement:   Goal of Therapy:   < 7.0% Hgb A1C   Action Suggested:  > 8.0% Hgb A1C  Ref:  Diabetes Care, 22, Suppl. 1, 1999         Assessment & Plan:

## 2014-10-21 NOTE — Assessment & Plan Note (Signed)
On B12 

## 2014-10-21 NOTE — Assessment & Plan Note (Signed)
Doing well 

## 2014-10-21 NOTE — Assessment & Plan Note (Signed)
Chronic  On Norco  Potential benefits of a long term opioids use as well as potential risks (i.e. addiction risk, apnea etc) and complications (i.e. Somnolence, constipation and others) were explained to the patient and were aknowledged. 

## 2014-10-21 NOTE — Assessment & Plan Note (Signed)
Labs in 3 mo

## 2014-11-04 DIAGNOSIS — H409 Unspecified glaucoma: Secondary | ICD-10-CM | POA: Diagnosis not present

## 2014-11-04 DIAGNOSIS — H4020X0 Unspecified primary angle-closure glaucoma, stage unspecified: Secondary | ICD-10-CM | POA: Diagnosis not present

## 2014-11-04 DIAGNOSIS — H3531 Nonexudative age-related macular degeneration: Secondary | ICD-10-CM | POA: Diagnosis not present

## 2014-11-04 DIAGNOSIS — H2513 Age-related nuclear cataract, bilateral: Secondary | ICD-10-CM | POA: Diagnosis not present

## 2014-11-18 DIAGNOSIS — H3562 Retinal hemorrhage, left eye: Secondary | ICD-10-CM | POA: Diagnosis not present

## 2014-11-18 DIAGNOSIS — H2513 Age-related nuclear cataract, bilateral: Secondary | ICD-10-CM | POA: Diagnosis not present

## 2014-11-18 DIAGNOSIS — H3531 Nonexudative age-related macular degeneration: Secondary | ICD-10-CM | POA: Diagnosis not present

## 2014-11-18 DIAGNOSIS — H35033 Hypertensive retinopathy, bilateral: Secondary | ICD-10-CM | POA: Diagnosis not present

## 2014-11-26 ENCOUNTER — Other Ambulatory Visit: Payer: Self-pay | Admitting: *Deleted

## 2014-11-26 MED ORDER — FINASTERIDE 5 MG PO TABS: 5.0000 mg | ORAL_TABLET | Freq: Every day | ORAL | Status: DC

## 2014-11-26 MED ORDER — TAMSULOSIN HCL 0.4 MG PO CAPS: 0.4000 mg | ORAL_CAPSULE | Freq: Every day | ORAL | Status: DC

## 2014-12-01 ENCOUNTER — Telehealth: Payer: Self-pay | Admitting: Internal Medicine

## 2014-12-01 NOTE — Telephone Encounter (Signed)
Patient states Dr. Rosana Hoes at [facility] was suppose to send letter over in regards to bleeding in left eye.  Patient states Dr. Rosana Hoes did not know why his eye was bleeding and would like Dr. Alain Marion to run test.  Patient would like a call back in regards.

## 2014-12-02 NOTE — Telephone Encounter (Signed)
I have not seen any info yet Thx

## 2014-12-03 NOTE — Telephone Encounter (Signed)
Pts wife informed.

## 2015-01-13 DIAGNOSIS — H3531 Nonexudative age-related macular degeneration: Secondary | ICD-10-CM | POA: Diagnosis not present

## 2015-01-13 DIAGNOSIS — H2513 Age-related nuclear cataract, bilateral: Secondary | ICD-10-CM | POA: Diagnosis not present

## 2015-01-13 DIAGNOSIS — H402222 Chronic angle-closure glaucoma, left eye, moderate stage: Secondary | ICD-10-CM | POA: Diagnosis not present

## 2015-01-13 DIAGNOSIS — H402211 Chronic angle-closure glaucoma, right eye, mild stage: Secondary | ICD-10-CM | POA: Diagnosis not present

## 2015-01-14 ENCOUNTER — Other Ambulatory Visit (INDEPENDENT_AMBULATORY_CARE_PROVIDER_SITE_OTHER): Payer: Medicare Other

## 2015-01-14 ENCOUNTER — Encounter: Payer: Self-pay | Admitting: Internal Medicine

## 2015-01-14 ENCOUNTER — Ambulatory Visit (INDEPENDENT_AMBULATORY_CARE_PROVIDER_SITE_OTHER): Payer: Medicare Other | Admitting: Internal Medicine

## 2015-01-14 VITALS — BP 130/82 | HR 60 | Wt 180.0 lb

## 2015-01-14 DIAGNOSIS — E559 Vitamin D deficiency, unspecified: Secondary | ICD-10-CM

## 2015-01-14 DIAGNOSIS — E538 Deficiency of other specified B group vitamins: Secondary | ICD-10-CM

## 2015-01-14 DIAGNOSIS — M79642 Pain in left hand: Secondary | ICD-10-CM | POA: Insufficient documentation

## 2015-01-14 DIAGNOSIS — M545 Low back pain, unspecified: Secondary | ICD-10-CM

## 2015-01-14 DIAGNOSIS — M5440 Lumbago with sciatica, unspecified side: Secondary | ICD-10-CM

## 2015-01-14 DIAGNOSIS — F32A Depression, unspecified: Secondary | ICD-10-CM

## 2015-01-14 DIAGNOSIS — F329 Major depressive disorder, single episode, unspecified: Secondary | ICD-10-CM | POA: Diagnosis not present

## 2015-01-14 DIAGNOSIS — R7989 Other specified abnormal findings of blood chemistry: Secondary | ICD-10-CM

## 2015-01-14 DIAGNOSIS — R946 Abnormal results of thyroid function studies: Secondary | ICD-10-CM

## 2015-01-14 LAB — TSH: TSH: 6.47 u[IU]/mL — ABNORMAL HIGH (ref 0.35–4.50)

## 2015-01-14 LAB — VITAMIN B12: Vitamin B-12: >1500 pg/mL — ABNORMAL HIGH (ref 211–911)

## 2015-01-14 LAB — BASIC METABOLIC PANEL
BUN: 19 mg/dL (ref 6–23)
CO2: 28 mEq/L (ref 19–32)
Calcium: 9 mg/dL (ref 8.4–10.5)
Chloride: 105 mEq/L (ref 96–112)
Creatinine, Ser: 1.09 mg/dL (ref 0.40–1.50)
GFR: 69.97 mL/min (ref >60.00)
Glucose, Bld: 78 mg/dL (ref 70–99)
Potassium: 4.1 mEq/L (ref 3.5–5.1)
Sodium: 139 mEq/L (ref 135–145)

## 2015-01-14 LAB — T4, FREE: Free T4: 1.01 ng/dL (ref 0.60–1.60)

## 2015-01-14 MED ORDER — HYDROCODONE-ACETAMINOPHEN 7.5-325 MG PO TABS: 1.0000 | ORAL_TABLET | Freq: Four times a day (QID) | ORAL | Status: DC | PRN

## 2015-01-14 NOTE — Progress Notes (Signed)
Pre visit review using our clinic review tool, if applicable. No additional management support is needed unless otherwise documented below in the visit note. 

## 2015-01-14 NOTE — Progress Notes (Signed)
Subjective:  Patient ID: Justin [name], male    DOB: Nov 10, 1940  Age: 74 y.o. MRN: [id number]  CC: No chief complaint on file.   HPI [place] presents for OA,   Outpatient Prescriptions Prior to Visit  Medication Sig Dispense Refill  . aspirin 81 MG EC tablet Take 81 mg by mouth daily.      . Cholecalciferol 1000 UNITS tablet Take 1,000 Units by mouth daily.      . Cyanocobalamin (VITAMIN B-12) 1000 MCG SUBL DISSOLVE 2 TABLETS IN MOUTH EVERY DAY 60 each 11  . Desoximetasone (TOPICORT) 0.25 % ointment Apply topically 4 (four) times daily. On hands     . finasteride (PROSCAR) 5 MG tablet Take 1 tablet (5 mg total) by mouth daily. 90 tablet 3  . HYDROcodone-acetaminophen (NORCO) 7.5-325 MG per tablet Take 1 tablet by mouth 4 (four) times daily as needed for severe pain. Please fill on or after 12/21/14 120 tablet 0  . loratadine (CLARITIN) 10 MG tablet Take 1 tablet (10 mg total) by mouth daily as needed. For allergies 100 tablet 3  . Multiple Vitamin (MULTIVITAMIN) capsule Take 1 capsule by mouth daily.      . polyethylene glycol (GLYCOLAX/MIRALAX) powder Take 17 g by mouth daily as needed. For constipation     . rOPINIRole (REQUIP) 1 MG tablet Take 1-2 mg by mouth at bedtime as needed. For restless legs     . tadalafil (CIALIS) 20 MG tablet Take 10-20 mg by mouth as directed. Every one to three days as needed     . tamsulosin (FLOMAX) 0.4 MG CAPS capsule Take 1 capsule (0.4 mg total) by mouth daily. 90 capsule 3  . triamcinolone (KENALOG) 0.5 % cream Apply topically daily. In eucerin lotion 1:10     . LATANOPROST OP Apply 1 drop to eye at bedtime.      . fluticasone (FLONASE) 50 MCG/ACT nasal spray Place 2 sprays into the nose daily. 16 g 6  . travoprost, benzalkonium, (TRAVATAN) 0.004 % ophthalmic solution 1 drop at bedtime.       No facility-administered medications prior to visit.    ROS Review of Systems  Constitutional: Negative for appetite change, fatigue and  unexpected weight change.  HENT: Negative for congestion, nosebleeds, sneezing, sore throat and trouble swallowing.   Eyes: Positive for visual disturbance. Negative for itching.  Respiratory: Negative for cough.   Cardiovascular: Negative for chest pain, palpitations and leg swelling.  Gastrointestinal: Negative for nausea, diarrhea, blood in stool and abdominal distention.  Genitourinary: Negative for frequency and hematuria.  Musculoskeletal: Positive for back pain, arthralgias and neck pain. Negative for joint swelling and gait problem.  Skin: Negative for rash.  Neurological: Negative for dizziness, tremors, speech difficulty and weakness.  Psychiatric/Behavioral: Positive for sleep disturbance and decreased concentration. Negative for dysphoric mood and agitation. The patient is not nervous/anxious.     Objective:  BP 130/82 mmHg  Pulse 60  Wt 180 lb (81.647 kg)  SpO2 98%  BP Readings from Last 3 Encounters:  01/14/15 130/82  10/21/14 134/78  07/21/14 150/80    Wt Readings from Last 3 Encounters:  01/14/15 180 lb (81.647 kg)  10/21/14 183 lb (83.008 kg)  07/21/14 186 lb (84.369 kg)    Physical Exam  Constitutional: He is oriented to person, place, and time. He appears well-developed. No distress.  NAD  HENT:  Mouth/Throat: Oropharynx is clear and moist.  Eyes: Conjunctivae are normal. Pupils are equal, round, and reactive  to light.  Neck: Normal range of motion. No JVD present. No thyromegaly present.  Cardiovascular: Normal rate, regular rhythm, normal heart sounds and intact distal pulses.  Exam reveals no gallop and no friction rub.   No murmur heard. Pulmonary/Chest: Effort normal and breath sounds normal. No respiratory distress. He has no wheezes. He has no rales. He exhibits no tenderness.  Abdominal: Soft. Bowel sounds are normal. He exhibits no distension and no mass. There is no tenderness. There is no rebound and no guarding.  Musculoskeletal: Normal range  of motion. He exhibits tenderness. He exhibits no edema.  Lymphadenopathy:    He has no cervical adenopathy.  Neurological: He is alert and oriented to person, place, and time. He has normal reflexes. No cranial nerve deficit. He exhibits normal muscle tone. He displays a negative Romberg sign. Coordination and gait normal.  Skin: Skin is warm and dry. No rash noted.  Psychiatric: He has a normal mood and affect. His behavior is normal. Judgment and thought content normal.   LS tender Hands - L hand with fingers #4-5 contracted  Lab Results  Component Value Date   WBC 6.8 04/07/2014   HGB 11.8* 04/07/2014   HCT 34.3* 04/07/2014   PLT 225.0 04/07/2014   GLUCOSE 81 04/07/2014   CHOL 156 04/07/2014   TRIG 44.0 04/07/2014   HDL 47.20 04/07/2014   LDLCALC 100* 04/07/2014   ALT 12 04/07/2014   AST 18 04/07/2014   NA 141 04/07/2014   K 4.7 04/07/2014   CL 107 04/07/2014   CREATININE 1.1 04/07/2014   BUN 18 04/07/2014   CO2 23 04/07/2014   TSH 5.24* 07/21/2014   PSA 0.90 04/07/2014   HGBA1C  12/24/2007    5.7 (NOTE)   The ADA recommends the following therapeutic goals for glycemic   control related to Hgb A1C measurement:   Goal of Therapy:   < 7.0% Hgb A1C   Action Suggested:  > 8.0% Hgb A1C   Ref:  Diabetes Care, 22, Suppl. 1, 1999    Dg Chest 2 View  03/06/2013   *RADIOLOGY REPORT*  Clinical Data: Cough and history of tobacco use.  CHEST - 2 VIEW  Comparison: 12/19/2007  Findings: Stable probable mild chronic lung disease.  No evidence of edema, infiltrate, nodule or pleural fluid.  The heart size and mediastinal contours are within normal limits.  Bony thorax is unremarkable.  IMPRESSION: Probable mild chronic lung disease.  No acute findings.   Original Report Authenticated By: Aletta Edouard, M.D.    Assessment & Plan:   There are no diagnoses linked to this encounter. I have discontinued Mr. Mccary's LATANOPROST OP. I am also having him maintain his aspirin, tadalafil,  polyethylene glycol powder, multivitamin, rOPINIRole, Desoximetasone, travoprost (benzalkonium), triamcinolone cream, Cholecalciferol, loratadine, fluticasone, Vitamin B-12, HYDROcodone-acetaminophen, finasteride, tamsulosin, dorzolamide, and latanoprost.  Meds ordered this encounter  Medications  . dorzolamide (TRUSOPT) 2 % ophthalmic solution    Sig: Place 1 drop into both eyes 2 (two) times daily.  [name] latanoprost (XALATAN) 0.005 % ophthalmic solution    Sig: Place 1 drop into both eyes daily.     Follow-up: No Follow-up on file.  [name], MD

## 2015-01-14 NOTE — Assessment & Plan Note (Signed)
On B12 

## 2015-01-14 NOTE — Assessment & Plan Note (Signed)
Vit D 

## 2015-01-14 NOTE — Assessment & Plan Note (Signed)
-   L hand with fingers #4-5 contracted

## 2015-01-14 NOTE — Assessment & Plan Note (Signed)
Chronic  On Norco  Potential benefits of a long term opioids use as well as potential risks (i.e. addiction risk, apnea etc) and complications (i.e. Somnolence, constipation and others) were explained to the patient and were aknowledged. 

## 2015-01-14 NOTE — Assessment & Plan Note (Signed)
Doing well 

## 2015-01-15 DIAGNOSIS — H3532 Exudative age-related macular degeneration: Secondary | ICD-10-CM | POA: Diagnosis not present

## 2015-01-15 DIAGNOSIS — H3531 Nonexudative age-related macular degeneration: Secondary | ICD-10-CM | POA: Diagnosis not present

## 2015-01-20 DIAGNOSIS — H3532 Exudative age-related macular degeneration: Secondary | ICD-10-CM | POA: Diagnosis not present

## 2015-02-09 ENCOUNTER — Ambulatory Visit (INDEPENDENT_AMBULATORY_CARE_PROVIDER_SITE_OTHER): Payer: Medicare Other | Admitting: Internal Medicine

## 2015-02-09 ENCOUNTER — Encounter: Payer: Self-pay | Admitting: Internal Medicine

## 2015-02-09 ENCOUNTER — Telehealth: Payer: Self-pay | Admitting: *Deleted

## 2015-02-09 VITALS — BP 144/80 | HR 54 | Wt 178.0 lb

## 2015-02-09 DIAGNOSIS — E538 Deficiency of other specified B group vitamins: Secondary | ICD-10-CM | POA: Diagnosis not present

## 2015-02-09 DIAGNOSIS — M5440 Lumbago with sciatica, unspecified side: Secondary | ICD-10-CM

## 2015-02-09 DIAGNOSIS — R42 Dizziness and giddiness: Secondary | ICD-10-CM | POA: Insufficient documentation

## 2015-02-09 MED ORDER — ONDANSETRON HCL 4 MG PO TABS: 4.0000 mg | ORAL_TABLET | Freq: Three times a day (TID) | ORAL | Status: DC | PRN

## 2015-02-09 MED ORDER — MECLIZINE HCL 12.5 MG PO TABS: 12.5000 mg | ORAL_TABLET | Freq: Three times a day (TID) | ORAL | Status: DC | PRN

## 2015-02-09 NOTE — Progress Notes (Signed)
Subjective:  Patient ID: Justin [name], male    DOB: Feb 08, 1941  Age: 74 y.o. MRN: [id number]  CC: Dizziness   HPI Kareem Cathey Marcelo Baldy [place] presents for dizzy spells x2. H/o chronic pain, B12 def, LBP. He has had recurrent near-syncope related to dizzy spells x years (had a couple near-syncope episodes lately, last on 8/2). H/o Menier's disease, [place]  Outpatient Prescriptions Prior to Visit  Medication Sig Dispense Refill  . aspirin 81 MG EC tablet Take 81 mg by mouth daily.      . Cholecalciferol 1000 UNITS tablet Take 1,000 Units by mouth daily.      . Cyanocobalamin (VITAMIN B-12) 1000 MCG SUBL DISSOLVE 2 TABLETS IN MOUTH EVERY DAY 60 each 11  . dorzolamide (TRUSOPT) 2 % ophthalmic solution Place 1 drop into both eyes 2 (two) times daily.    . finasteride (PROSCAR) 5 MG tablet Take 1 tablet (5 mg total) by mouth daily. 90 tablet 3  . HYDROcodone-acetaminophen (NORCO) 7.5-325 MG per tablet Take 1 tablet by mouth 4 (four) times daily as needed for severe pain. Please fill on or after 03/23/15 120 tablet 0  . latanoprost (XALATAN) 0.005 % ophthalmic solution Place 1 drop into both eyes daily.    . Multiple Vitamin (MULTIVITAMIN) capsule Take 1 capsule by mouth daily.      [name] rOPINIRole (REQUIP) 1 MG tablet Take 1-2 mg by mouth at bedtime as needed. For restless legs     . tamsulosin (FLOMAX) 0.4 MG CAPS capsule Take 1 capsule (0.4 mg total) by mouth daily. 90 capsule 3  . Desoximetasone (TOPICORT) 0.25 % ointment Apply topically 4 (four) times daily. On hands     . fluticasone (FLONASE) 50 MCG/ACT nasal spray Place 2 sprays into the nose daily. 16 g 6  . loratadine (CLARITIN) 10 MG tablet Take 1 tablet (10 mg total) by mouth daily as needed. For allergies (Patient not taking: Reported on 02/09/2015) 100 tablet 3  . polyethylene glycol (GLYCOLAX/MIRALAX) powder Take 17 g by mouth daily as needed. For constipation     . tadalafil (CIALIS) 20 MG tablet Take 10-20  mg by mouth as directed. Every one to three days as needed     . travoprost, benzalkonium, (TRAVATAN) 0.004 % ophthalmic solution 1 drop at bedtime.      . triamcinolone (KENALOG) 0.5 % cream Apply topically daily. In eucerin lotion 1:10      No facility-administered medications prior to visit.    ROS Review of Systems  Constitutional: Negative for appetite change, fatigue and unexpected weight change.  HENT: Positive for hearing loss. Negative for congestion, nosebleeds, sneezing, sore throat, trouble swallowing and voice change.   Eyes: Negative for itching and visual disturbance.  Respiratory: Negative for cough.   Cardiovascular: Negative for chest pain, palpitations and leg swelling.  Gastrointestinal: Negative for nausea, diarrhea, blood in stool and abdominal distention.  Genitourinary: Negative for frequency and hematuria.  Musculoskeletal: Positive for back pain, arthralgias, neck pain and neck stiffness. Negative for joint swelling and gait problem.  Skin: Negative for pallor and rash.  Neurological: Positive for dizziness and syncope. Negative for tremors, speech difficulty and weakness.  Psychiatric/Behavioral: Negative for suicidal ideas, sleep disturbance, dysphoric mood and agitation. The patient is not nervous/anxious.     Objective:  BP 144/80 mmHg  Pulse 54  Wt 178 lb (80.74 kg)  SpO2 98%  BP Readings from Last 3 Encounters:  02/09/15 144/80  01/14/15 130/82  10/21/14 134/78    Wt Readings from Last 3 Encounters:  02/09/15 178 lb (80.74 kg)  01/14/15 180 lb (81.647 kg)  10/21/14 183 lb (83.008 kg)    Physical Exam  Constitutional: He is oriented to person, place, and time. He appears well-developed. No distress.  NAD  HENT:  Mouth/Throat: Oropharynx is clear and moist.  Eyes: Conjunctivae are normal. Pupils are equal, round, and reactive to light.  Neck: Normal range of motion. No JVD present. No thyromegaly present.  Cardiovascular: Normal rate,  regular rhythm, normal heart sounds and intact distal pulses.  Exam reveals no gallop and no friction rub.   No murmur heard. Pulmonary/Chest: Effort normal and breath sounds normal. No respiratory distress. He has no wheezes. He has no rales. He exhibits no tenderness.  Abdominal: Soft. Bowel sounds are normal. He exhibits no distension and no mass. There is no tenderness. There is no rebound and no guarding.  Musculoskeletal: Normal range of motion. He exhibits tenderness. He exhibits no edema.  Lymphadenopathy:    He has no cervical adenopathy.  Neurological: He is alert and oriented to person, place, and time. He has normal reflexes. No cranial nerve deficit. He exhibits normal muscle tone. He displays a negative Romberg sign. Coordination and gait normal.  Skin: Skin is warm and dry. No rash noted.  Psychiatric: He has a normal mood and affect. His behavior is normal. Judgment and thought content normal.  Mild R nystagmus  Lab Results  Component Value Date   WBC 6.8 04/07/2014   HGB 11.8* 04/07/2014   HCT 34.3* 04/07/2014   PLT 225.0 04/07/2014   GLUCOSE 78 01/14/2015   CHOL 156 04/07/2014   TRIG 44.0 04/07/2014   HDL 47.20 04/07/2014   LDLCALC 100* 04/07/2014   ALT 12 04/07/2014   AST 18 04/07/2014   NA 139 01/14/2015   K 4.1 01/14/2015   CL 105 01/14/2015   CREATININE 1.09 01/14/2015   BUN 19 01/14/2015   CO2 28 01/14/2015   TSH 6.47* 01/14/2015   PSA 0.90 04/07/2014   HGBA1C  12/24/2007    5.7 (NOTE)   The ADA recommends the following therapeutic goals for glycemic   control related to Hgb A1C measurement:   Goal of Therapy:   < 7.0% Hgb A1C   Action Suggested:  > 8.0% Hgb A1C   Ref:  Diabetes Care, 22, Suppl. 1, 1999    Dg Chest 2 View  03/06/2013   *RADIOLOGY REPORT*  Clinical Data: Cough and history of tobacco use.  CHEST - 2 VIEW  Comparison: 12/19/2007  Findings: Stable probable mild chronic lung disease.  No evidence of edema, infiltrate, nodule or pleural fluid.   The heart size and mediastinal contours are within normal limits.  Bony thorax is unremarkable.  IMPRESSION: Probable mild chronic lung disease.  No acute findings.   Original Report Authenticated By: Aletta Edouard, M.D.    Assessment & Plan:   Jace was seen today for dizziness.  Diagnoses and all orders for this visit:  Vertigo  Other orders -     Discontinue: ondansetron (ZOFRAN) 4 MG tablet; Take 1 tablet (4 mg total) by mouth every 8 (eight) hours as needed for nausea or vomiting. -     Discontinue: meclizine (ANTIVERT) 12.5 MG tablet; Take 1 tablet (12.5 mg total) by mouth 3 (three) times daily as needed for dizziness.  I am having Mr. Bittinger maintain his aspirin, tadalafil, polyethylene glycol powder, multivitamin, rOPINIRole, Desoximetasone, travoprost (benzalkonium), triamcinolone cream, Cholecalciferol,  loratadine, fluticasone, Vitamin B-12, finasteride, tamsulosin, dorzolamide, latanoprost, and HYDROcodone-acetaminophen.  Meds ordered this encounter  Medications  . DISCONTD: ondansetron (ZOFRAN) 4 MG tablet    Sig: Take 1 tablet (4 mg total) by mouth every 8 (eight) hours as needed for nausea or vomiting.    Dispense:  20 tablet    Refill:  0  . DISCONTD: meclizine (ANTIVERT) 12.5 MG tablet    Sig: Take 1 tablet (12.5 mg total) by mouth 3 (three) times daily as needed for dizziness.    Dispense:  60 tablet    Refill:  1     Follow-up: No Follow-up on file.  [name], MD

## 2015-02-09 NOTE — Assessment & Plan Note (Signed)
Recurrent. BPV vs other. Poss Menire's  Meclizine prn Zofran prn

## 2015-02-09 NOTE — Telephone Encounter (Signed)
Left msg on triage stating received script for zofran & meclizine md only gave # 20 on the zofran needing to clarify if pt is receiving any chemotherapy because insurance handle that differently. Called Joanne Ch[facility] to void script should have went to local pharmacy [facility]. Sent [facility] to [facility].../lmb

## 2015-02-09 NOTE — Progress Notes (Signed)
Pre visit review using our clinic review tool, if applicable. No additional management support is needed unless otherwise documented below in the visit note. 

## 2015-02-15 ENCOUNTER — Encounter: Payer: Self-pay | Admitting: Internal Medicine

## 2015-02-15 NOTE — Assessment & Plan Note (Signed)
On B12 

## 2015-02-15 NOTE — Assessment & Plan Note (Signed)
On Norco  Potential benefits of a long term opioids use as well as potential risks (i.e. addiction risk, apnea etc) and complications (i.e. Somnolence, constipation and others) were explained to the patient and were aknowledged.  

## 2015-02-23 DIAGNOSIS — H3532 Exudative age-related macular degeneration: Secondary | ICD-10-CM | POA: Diagnosis not present

## 2015-02-24 DIAGNOSIS — Z85828 Personal history of other malignant neoplasm of skin: Secondary | ICD-10-CM | POA: Diagnosis not present

## 2015-02-24 DIAGNOSIS — L570 Actinic keratosis: Secondary | ICD-10-CM | POA: Diagnosis not present

## 2015-02-24 DIAGNOSIS — D485 Neoplasm of uncertain behavior of skin: Secondary | ICD-10-CM | POA: Diagnosis not present

## 2015-02-24 DIAGNOSIS — D1801 Hemangioma of skin and subcutaneous tissue: Secondary | ICD-10-CM | POA: Diagnosis not present

## 2015-02-24 DIAGNOSIS — L821 Other seborrheic keratosis: Secondary | ICD-10-CM | POA: Diagnosis not present

## 2015-02-24 DIAGNOSIS — D225 Melanocytic nevi of trunk: Secondary | ICD-10-CM | POA: Diagnosis not present

## 2015-03-25 ENCOUNTER — Telehealth: Payer: Self-pay | Admitting: Internal Medicine

## 2015-03-25 NOTE — Telephone Encounter (Signed)
Patient called to advise that he would like a phone call from you- even if its after hours. He stated that he has a family issue that he would [facility] talk about, but gave no other detail.

## 2015-03-27 NOTE — Telephone Encounter (Signed)
I spoke w/pt on Fri

## 2015-03-30 DIAGNOSIS — H3532 Exudative age-related macular degeneration: Secondary | ICD-10-CM | POA: Diagnosis not present

## 2015-04-14 ENCOUNTER — Ambulatory Visit: Payer: Medicare Other | Admitting: Internal Medicine

## 2015-04-15 DIAGNOSIS — H01003 Unspecified blepharitis right eye, unspecified eyelid: Secondary | ICD-10-CM | POA: Diagnosis not present

## 2015-04-15 DIAGNOSIS — H402211 Chronic angle-closure glaucoma, right eye, mild stage: Secondary | ICD-10-CM | POA: Diagnosis not present

## 2015-04-15 DIAGNOSIS — H04123 Dry eye syndrome of bilateral lacrimal glands: Secondary | ICD-10-CM | POA: Diagnosis not present

## 2015-04-15 DIAGNOSIS — H402222 Chronic angle-closure glaucoma, left eye, moderate stage: Secondary | ICD-10-CM | POA: Diagnosis not present

## 2015-04-20 ENCOUNTER — Ambulatory Visit: Payer: Medicare Other | Admitting: Internal Medicine

## 2015-04-29 ENCOUNTER — Encounter: Payer: Self-pay | Admitting: Internal Medicine

## 2015-04-29 ENCOUNTER — Ambulatory Visit (INDEPENDENT_AMBULATORY_CARE_PROVIDER_SITE_OTHER): Payer: Medicare Other | Admitting: Internal Medicine

## 2015-04-29 VITALS — BP 140/78 | HR 67 | Wt 178.0 lb

## 2015-04-29 DIAGNOSIS — M5440 Lumbago with sciatica, unspecified side: Secondary | ICD-10-CM

## 2015-04-29 DIAGNOSIS — M545 Low back pain, unspecified: Secondary | ICD-10-CM

## 2015-04-29 DIAGNOSIS — E538 Deficiency of other specified B group vitamins: Secondary | ICD-10-CM

## 2015-04-29 DIAGNOSIS — N320 Bladder-neck obstruction: Secondary | ICD-10-CM

## 2015-04-29 DIAGNOSIS — E559 Vitamin D deficiency, unspecified: Secondary | ICD-10-CM | POA: Diagnosis not present

## 2015-04-29 DIAGNOSIS — G8929 Other chronic pain: Secondary | ICD-10-CM

## 2015-04-29 DIAGNOSIS — Z23 Encounter for immunization: Secondary | ICD-10-CM

## 2015-04-29 DIAGNOSIS — R739 Hyperglycemia, unspecified: Secondary | ICD-10-CM

## 2015-04-29 DIAGNOSIS — M720 Palmar fascial fibromatosis [Dupuytren]: Secondary | ICD-10-CM

## 2015-04-29 MED ORDER — HYDROCODONE-ACETAMINOPHEN 7.5-325 MG PO TABS: 1.0000 | ORAL_TABLET | Freq: Four times a day (QID) | ORAL | Status: DC | PRN

## 2015-04-29 NOTE — Progress Notes (Signed)
Pre visit review using our clinic review tool, if applicable. No additional management support is needed unless otherwise documented below in the visit note. 

## 2015-04-29 NOTE — Progress Notes (Signed)
Subjective:  Patient ID: Justin Pagan Su Grand., male    DOB: Jan 22, 1941  Age: 74 y.o. MRN: [id number]  CC: No chief complaint on file.   HPI Justin Nuon Prine Jr. presents for chronic pain, B12 def, BPH f/u  Outpatient Prescriptions Prior to Visit  Medication Sig Dispense Refill  . aspirin 81 MG EC tablet Take 81 mg by mouth daily.      . Cholecalciferol 1000 UNITS tablet Take 1,000 Units by mouth daily.      . Cyanocobalamin (VITAMIN B-12) 1000 MCG SUBL DISSOLVE 2 TABLETS IN MOUTH EVERY DAY 60 each 11  . Desoximetasone (TOPICORT) 0.25 % ointment Apply topically 4 (four) times daily. On hands     . dorzolamide (TRUSOPT) 2 % ophthalmic solution Place 1 drop into both eyes 2 (two) times daily.    . finasteride (PROSCAR) 5 MG tablet Take 1 tablet (5 mg total) by mouth daily. 90 tablet 3  . latanoprost (XALATAN) 0.005 % ophthalmic solution Place 1 drop into both eyes daily.    [name] loratadine (CLARITIN) 10 MG tablet Take 1 tablet (10 mg total) by mouth daily as needed. For allergies 100 tablet 3  . meclizine (ANTIVERT) 12.5 MG tablet Take 1 tablet (12.5 mg total) by mouth 3 (three) times daily as needed for dizziness. 60 tablet 1  . Multiple Vitamin (MULTIVITAMIN) capsule Take 1 capsule by mouth daily.      . ondansetron (ZOFRAN) 4 MG tablet Take 1 tablet (4 mg total) by mouth every 8 (eight) hours as needed for nausea or vomiting. 20 tablet 0  . polyethylene glycol (GLYCOLAX/MIRALAX) powder Take 17 g by mouth daily as needed. For constipation     . rOPINIRole (REQUIP) 1 MG tablet Take 1-2 mg by mouth at bedtime as needed. For restless legs     . tadalafil (CIALIS) 20 MG tablet Take 10-20 mg by mouth as directed. Every one to three days as needed     . tamsulosin (FLOMAX) 0.4 MG CAPS capsule Take 1 capsule (0.4 mg total) by mouth daily. 90 capsule 3  . travoprost, benzalkonium, (TRAVATAN) 0.004 % ophthalmic solution 1 drop at bedtime.      . triamcinolone (KENALOG) 0.5 % cream Apply topically  daily. In eucerin lotion 1:10     . HYDROcodone-acetaminophen (NORCO) 7.5-325 MG per tablet Take 1 tablet by mouth 4 (four) times daily as needed for severe pain. Please fill on or after 03/23/15 120 tablet 0  . fluticasone (FLONASE) 50 MCG/ACT nasal spray Place 2 sprays into the nose daily. 16 g 6   No facility-administered medications prior to visit.    ROS Review of Systems  Constitutional: Negative for appetite change, fatigue and unexpected weight change.  HENT: Negative for congestion, nosebleeds, sneezing, sore throat and trouble swallowing.   Eyes: Negative for itching and visual disturbance.  Respiratory: Negative for cough.   Cardiovascular: Negative for chest pain, palpitations and leg swelling.  Gastrointestinal: Negative for nausea, diarrhea, blood in stool and abdominal distention.  Genitourinary: Negative for frequency and hematuria.  Musculoskeletal: Positive for back pain and arthralgias. Negative for joint swelling, gait problem and neck pain.  Skin: Negative for rash.  Neurological: Negative for dizziness, tremors, speech difficulty and weakness.  Psychiatric/Behavioral: Negative for suicidal ideas, sleep disturbance, dysphoric mood and agitation. The patient is not nervous/anxious.     Objective:  BP 140/78 mmHg  Pulse 67  Wt 178 lb (80.74 kg)  SpO2 97%  BP Readings from Last 3 Encounters:  04/29/15 140/78  02/09/15 144/80  01/14/15 130/82    Wt Readings from Last 3 Encounters:  04/29/15 178 lb (80.74 kg)  02/09/15 178 lb (80.74 kg)  01/14/15 180 lb (81.647 kg)    Physical Exam  Constitutional: He is oriented to person, place, and time. He appears well-developed. No distress.  NAD  HENT:  Mouth/Throat: Oropharynx is clear and moist.  Eyes: Conjunctivae are normal. Pupils are equal, round, and reactive to light.  Neck: Normal range of motion. No JVD present. No thyromegaly present.  Cardiovascular: Normal rate, regular rhythm, normal heart sounds and  intact distal pulses.  Exam reveals no gallop and no friction rub.   No murmur heard. Pulmonary/Chest: Effort normal and breath sounds normal. No respiratory distress. He has no wheezes. He has no rales. He exhibits no tenderness.  Abdominal: Soft. Bowel sounds are normal. He exhibits no distension and no mass. There is no tenderness. There is no rebound and no guarding.  Musculoskeletal: Normal range of motion. He exhibits no edema or tenderness.  Lymphadenopathy:    He has no cervical adenopathy.  Neurological: He is alert and oriented to person, place, and time. He has normal reflexes. No cranial nerve deficit. He exhibits normal muscle tone. He displays a negative Romberg sign. Coordination and gait normal.  Skin: Skin is warm and dry. No rash noted.  Psychiatric: He has a normal mood and affect. His behavior is normal. Judgment and thought content normal.    Lab Results  Component Value Date   WBC 6.8 04/07/2014   HGB 11.8* 04/07/2014   HCT 34.3* 04/07/2014   PLT 225.0 04/07/2014   GLUCOSE 78 01/14/2015   CHOL 156 04/07/2014   TRIG 44.0 04/07/2014   HDL 47.20 04/07/2014   LDLCALC 100* 04/07/2014   ALT 12 04/07/2014   AST 18 04/07/2014   NA 139 01/14/2015   K 4.1 01/14/2015   CL 105 01/14/2015   CREATININE 1.09 01/14/2015   BUN 19 01/14/2015   CO2 28 01/14/2015   TSH 6.47* 01/14/2015   PSA 0.90 04/07/2014   HGBA1C  12/24/2007    5.7 (NOTE)   The ADA recommends the following therapeutic goals for glycemic   control related to Hgb A1C measurement:   Goal of Therapy:   < 7.0% Hgb A1C   Action Suggested:  > 8.0% Hgb A1C   Ref:  Diabetes Care, 22, Suppl. 1, 1999    Dg Chest 2 View  03/06/2013  *RADIOLOGY REPORT* Clinical Data: Cough and history of tobacco use. CHEST - 2 VIEW Comparison: 12/19/2007 Findings: Stable probable mild chronic lung disease.  No evidence of edema, infiltrate, nodule or pleural fluid.  The heart size and mediastinal contours are within normal limits.  Bony  thorax is unremarkable. IMPRESSION: Probable mild chronic lung disease.  No acute findings. Original Report Authenticated By: Aletta Edouard, M.D.    Assessment & Plan:   Diagnoses and all orders for this visit:  Bilateral low back pain without sciatica -     Basic metabolic panel; Future -     CBC with Differential/Platelet; Future -     Hepatic function panel; Future -     Hemoglobin A1c; Future -     Lipid panel; Future -     TSH; Future -     Urinalysis; Future -     PSA; Future  Chronic midline low back pain with sciatica, sciatica laterality unspecified -     Basic metabolic panel; Future -  CBC with Differential/Platelet; Future -     Hepatic function panel; Future -     Hemoglobin A1c; Future -     Lipid panel; Future -     TSH; Future -     Urinalysis; Future -     PSA; Future  Vitamin B12 deficiency -     Basic metabolic panel; Future -     CBC with Differential/Platelet; Future -     Hepatic function panel; Future -     Hemoglobin A1c; Future -     Lipid panel; Future -     TSH; Future -     Urinalysis; Future -     PSA; Future  Vitamin D deficiency -     Basic metabolic panel; Future -     CBC with Differential/Platelet; Future -     Hepatic function panel; Future -     Hemoglobin A1c; Future -     Lipid panel; Future -     TSH; Future -     Urinalysis; Future -     PSA; Future  Dupuytren's contracture of hand -     Basic metabolic panel; Future -     CBC with Differential/Platelet; Future -     Hepatic function panel; Future -     Hemoglobin A1c; Future -     Lipid panel; Future -     TSH; Future -     Urinalysis; Future -     PSA; Future  Hyperglycemia -     Basic metabolic panel; Future -     CBC with Differential/Platelet; Future -     Hepatic function panel; Future -     Hemoglobin A1c; Future -     Lipid panel; Future -     TSH; Future -     Urinalysis; Future -     PSA; Future  Bladder neck obstruction -     Basic metabolic  panel; Future -     CBC with Differential/Platelet; Future -     Hepatic function panel; Future -     Hemoglobin A1c; Future -     Lipid panel; Future -     TSH; Future -     Urinalysis; Future -     PSA; Future  Need for influenza vaccination -     Flu Vaccine QUAD 36+ mos IM  Other orders -     Discontinue: HYDROcodone-acetaminophen (NORCO) 7.5-325 MG tablet; Take 1 tablet by mouth 4 (four) times daily as needed for severe pain. Please fill on or after 03/23/15 -     Discontinue: HYDROcodone-acetaminophen (NORCO) 7.5-325 MG tablet; Take 1 tablet by mouth 4 (four) times daily as needed for severe pain. Please fill on or after 04/29/15 -     Discontinue: HYDROcodone-acetaminophen (NORCO) 7.5-325 MG tablet; Take 1 tablet by mouth 4 (four) times daily as needed for severe pain. Please fill on or after 05/30/15 -     HYDROcodone-acetaminophen (NORCO) 7.5-325 MG tablet; Take 1 tablet by mouth 4 (four) times daily as needed for severe pain. Please fill on or after 06/29/15   I have discontinued Justin [name]'s HYDROcodone-acetaminophen, HYDROcodone-acetaminophen, and HYDROcodone-acetaminophen. I have also changed his HYDROcodone-acetaminophen. Additionally, I am having him maintain his aspirin, tadalafil, polyethylene glycol powder, multivitamin, rOPINIRole, Desoximetasone, travoprost (benzalkonium), triamcinolone cream, Cholecalciferol, loratadine, fluticasone, Vitamin B-12, finasteride, tamsulosin, dorzolamide, latanoprost, ondansetron, and meclizine.  Meds ordered this encounter  Medications  . DISCONTD: HYDROcodone-acetaminophen (NORCO) 7.5-325 MG tablet    Sig: Take  1 tablet by mouth 4 (four) times daily as needed for severe pain. Please fill on or after 03/23/15    Dispense:  120 tablet    Refill:  0  . DISCONTD: HYDROcodone-acetaminophen (NORCO) 7.5-325 MG tablet    Sig: Take 1 tablet by mouth 4 (four) times daily as needed for severe pain. Please fill on or after 04/29/15    Dispense:   120 tablet    Refill:  0  . DISCONTD: HYDROcodone-acetaminophen (NORCO) 7.5-325 MG tablet    Sig: Take 1 tablet by mouth 4 (four) times daily as needed for severe pain. Please fill on or after 05/30/15    Dispense:  120 tablet    Refill:  0  . HYDROcodone-acetaminophen (NORCO) 7.5-325 MG tablet    Sig: Take 1 tablet by mouth 4 (four) times daily as needed for severe pain. Please fill on or after 06/29/15    Dispense:  120 tablet    Refill:  0     Follow-up: Return in about 3 months (around 07/30/2015) for a follow-up visit, Wellness Exam.  [name], MD

## 2015-04-29 NOTE — Assessment & Plan Note (Signed)
No change 

## 2015-04-29 NOTE — Assessment & Plan Note (Signed)
Chronic  On Norco  Potential benefits of a long term opioids use as well as potential risks (i.e. addiction risk, apnea etc) and complications (i.e. Somnolence, constipation and others) were explained to the patient and were aknowledged. 

## 2015-04-29 NOTE — Assessment & Plan Note (Signed)
On B12 

## 2015-04-29 NOTE — Assessment & Plan Note (Signed)
On Vit D 

## 2015-04-30 DIAGNOSIS — H18833 Recurrent erosion of cornea, bilateral: Secondary | ICD-10-CM | POA: Diagnosis not present

## 2015-04-30 DIAGNOSIS — H04123 Dry eye syndrome of bilateral lacrimal glands: Secondary | ICD-10-CM | POA: Diagnosis not present

## 2015-04-30 DIAGNOSIS — H01003 Unspecified blepharitis right eye, unspecified eyelid: Secondary | ICD-10-CM | POA: Diagnosis not present

## 2015-05-04 DIAGNOSIS — H353221 Exudative age-related macular degeneration, left eye, with active choroidal neovascularization: Secondary | ICD-10-CM | POA: Diagnosis not present

## 2015-06-08 DIAGNOSIS — H353221 Exudative age-related macular degeneration, left eye, with active choroidal neovascularization: Secondary | ICD-10-CM | POA: Diagnosis not present

## 2015-06-23 ENCOUNTER — Other Ambulatory Visit: Payer: Self-pay | Admitting: Internal Medicine

## 2015-07-13 DIAGNOSIS — H353131 Nonexudative age-related macular degeneration, bilateral, early dry stage: Secondary | ICD-10-CM | POA: Diagnosis not present

## 2015-07-13 DIAGNOSIS — H353221 Exudative age-related macular degeneration, left eye, with active choroidal neovascularization: Secondary | ICD-10-CM | POA: Diagnosis not present

## 2015-07-27 ENCOUNTER — Telehealth: Payer: Self-pay

## 2015-07-27 NOTE — Telephone Encounter (Signed)
Call to see if Mr Dunkerley can  Come in around 9am for AWV on Friday the 27th prior to seeing Plotnikov at 9:45 or if he can stay after apt. To call back to confirm; will hold schedule

## 2015-07-28 ENCOUNTER — Other Ambulatory Visit (INDEPENDENT_AMBULATORY_CARE_PROVIDER_SITE_OTHER): Payer: PPO

## 2015-07-28 DIAGNOSIS — E559 Vitamin D deficiency, unspecified: Secondary | ICD-10-CM

## 2015-07-28 DIAGNOSIS — G8929 Other chronic pain: Secondary | ICD-10-CM

## 2015-07-28 DIAGNOSIS — M5440 Lumbago with sciatica, unspecified side: Secondary | ICD-10-CM

## 2015-07-28 DIAGNOSIS — M545 Low back pain, unspecified: Secondary | ICD-10-CM

## 2015-07-28 DIAGNOSIS — M720 Palmar fascial fibromatosis [Dupuytren]: Secondary | ICD-10-CM

## 2015-07-28 DIAGNOSIS — E538 Deficiency of other specified B group vitamins: Secondary | ICD-10-CM

## 2015-07-28 DIAGNOSIS — R739 Hyperglycemia, unspecified: Secondary | ICD-10-CM

## 2015-07-28 DIAGNOSIS — N320 Bladder-neck obstruction: Secondary | ICD-10-CM

## 2015-07-28 LAB — LIPID PANEL
Cholesterol: 131 mg/dL (ref 0–200)
HDL: 49.8 mg/dL (ref >39.00)
LDL Cholesterol: 62 mg/dL (ref 0–99)
NonHDL: 80.87
Total CHOL/HDL Ratio: 3
Triglycerides: 96 mg/dL (ref 0.0–149.0)
VLDL: 19.2 mg/dL (ref 0.0–40.0)

## 2015-07-28 LAB — HEPATIC FUNCTION PANEL
ALT: 9 U/L (ref 0–53)
AST: 15 U/L (ref 0–37)
Albumin: 4 g/dL (ref 3.5–5.2)
Alkaline Phosphatase: 53 U/L (ref 39–117)
Bilirubin, Direct: 0.1 mg/dL (ref 0.0–0.3)
Total Bilirubin: 0.4 mg/dL (ref 0.2–1.2)
Total Protein: 6.3 g/dL (ref 6.0–8.3)

## 2015-07-28 LAB — URINALYSIS
Bilirubin Urine: NEGATIVE
Hgb urine dipstick: NEGATIVE
Ketones, ur: NEGATIVE
Leukocytes, UA: NEGATIVE
Nitrite: NEGATIVE
Specific Gravity, Urine: >=1.03 — AB (ref 1.000–1.030)
Total Protein, Urine: NEGATIVE
Urine Glucose: NEGATIVE
Urobilinogen, UA: 0.2 (ref 0.0–1.0)
pH: 5 (ref 5.0–8.0)

## 2015-07-28 LAB — BASIC METABOLIC PANEL
BUN: 21 mg/dL (ref 6–23)
CO2: 28 mEq/L (ref 19–32)
Calcium: 8.9 mg/dL (ref 8.4–10.5)
Chloride: 106 mEq/L (ref 96–112)
Creatinine, Ser: 1.07 mg/dL (ref 0.40–1.50)
GFR: 71.38 mL/min (ref >60.00)
Glucose, Bld: 100 mg/dL — ABNORMAL HIGH (ref 70–99)
Potassium: 4.5 mEq/L (ref 3.5–5.1)
Sodium: 139 mEq/L (ref 135–145)

## 2015-07-28 LAB — CBC WITH DIFFERENTIAL/PLATELET
Basophils Absolute: 0 10*3/uL (ref 0.0–0.1)
Basophils Relative: 0.3 % (ref 0.0–3.0)
Eosinophils Absolute: 0.1 10*3/uL (ref 0.0–0.7)
Eosinophils Relative: 1.9 % (ref 0.0–5.0)
HCT: 33.5 % — ABNORMAL LOW (ref 39.0–52.0)
Hemoglobin: 11 g/dL — ABNORMAL LOW (ref 13.0–17.0)
Lymphocytes Relative: 25 % (ref 12.0–46.0)
Lymphs Abs: 1.7 10*3/uL (ref 0.7–4.0)
MCHC: 32.8 g/dL (ref 30.0–36.0)
MCV: 87.8 fl (ref 78.0–100.0)
Monocytes Absolute: 0.6 10*3/uL (ref 0.1–1.0)
Monocytes Relative: 8.6 % (ref 3.0–12.0)
Neutro Abs: 4.3 10*3/uL (ref 1.4–7.7)
Neutrophils Relative %: 64.2 % (ref 43.0–77.0)
Platelets: 226 10*3/uL (ref 150.0–400.0)
RBC: 3.82 Mil/uL — ABNORMAL LOW (ref 4.22–5.81)
RDW: 13.8 % (ref 11.5–15.5)
WBC: 6.8 10*3/uL (ref 4.0–10.5)

## 2015-07-28 LAB — PSA: PSA: 0.47 ng/mL (ref 0.10–4.00)

## 2015-07-28 LAB — HEMOGLOBIN A1C: Hgb A1c MFr Bld: 5.6 % (ref 4.6–6.5)

## 2015-07-28 LAB — TSH: TSH: 2.15 u[IU]/mL (ref 0.35–4.50)

## 2015-07-30 ENCOUNTER — Ambulatory Visit (INDEPENDENT_AMBULATORY_CARE_PROVIDER_SITE_OTHER): Payer: PPO | Admitting: Internal Medicine

## 2015-07-30 ENCOUNTER — Encounter: Payer: Self-pay | Admitting: Internal Medicine

## 2015-07-30 VITALS — BP 130/70 | HR 54 | Temp 97.7°F | Ht 72.0 in | Wt 177.8 lb

## 2015-07-30 DIAGNOSIS — M5440 Lumbago with sciatica, unspecified side: Secondary | ICD-10-CM | POA: Diagnosis not present

## 2015-07-30 DIAGNOSIS — M1990 Unspecified osteoarthritis, unspecified site: Secondary | ICD-10-CM | POA: Diagnosis not present

## 2015-07-30 DIAGNOSIS — G8929 Other chronic pain: Secondary | ICD-10-CM

## 2015-07-30 DIAGNOSIS — E538 Deficiency of other specified B group vitamins: Secondary | ICD-10-CM | POA: Diagnosis not present

## 2015-07-30 DIAGNOSIS — E559 Vitamin D deficiency, unspecified: Secondary | ICD-10-CM

## 2015-07-30 DIAGNOSIS — Z0000 Encounter for general adult medical examination without abnormal findings: Secondary | ICD-10-CM | POA: Diagnosis not present

## 2015-07-30 DIAGNOSIS — F32A Depression, unspecified: Secondary | ICD-10-CM

## 2015-07-30 DIAGNOSIS — F329 Major depressive disorder, single episode, unspecified: Secondary | ICD-10-CM

## 2015-07-30 MED ORDER — HYDROCODONE-ACETAMINOPHEN 7.5-325 MG PO TABS: 1.0000 | ORAL_TABLET | Freq: Four times a day (QID) | ORAL | Status: DC | PRN

## 2015-07-30 MED ORDER — IPRATROPIUM BROMIDE 0.06 % NA SOLN
2.0000 | Freq: Three times a day (TID) | NASAL | Status: DC
Start: 2015-07-30 — End: 2016-04-12

## 2015-07-30 NOTE — Assessment & Plan Note (Signed)
Chronic Risks associated with treatment noncompliance were discussed. Compliance was encouraged. 

## 2015-07-30 NOTE — Assessment & Plan Note (Signed)
On Norco  Potential benefits of a long term opioids use as well as potential risks (i.e. addiction risk, apnea etc) and complications (i.e. Somnolence, constipation and others) were explained to the patient and were aknowledged.  

## 2015-07-30 NOTE — Assessment & Plan Note (Signed)
On vit D 

## 2015-07-30 NOTE — Assessment & Plan Note (Signed)
Doing ok.

## 2015-07-30 NOTE — Progress Notes (Signed)
Subjective:   Justin [name]. is a 75 y.o. male who presents for Medicare Annual/Subsequent preventive examination.  Review of Systems:   Cardiac Risk Factors include: advanced age (>47men, >65 women) HRA assessment completed during visit; [place]  The Patient was informed that this wellness visit is to identify risk and educate on how to reduce risk for increase disease through lifestyle changes.   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; Stay on feet; care giver;   Stress: 3; Watches "gun smoke" to relax; [employer] show at hs   SAFETY one level; 2 bedroom; no stairs; / moved [place] to [employer] reviewed for the home;  Removal of clutter clearing paths through the home,  Railing as needed; not at this time Bathroom safety; free standing shower; wife d/a has a Hospital doctor; yes Smoke detectors / yes  Firearms safety; no  Driving accidents and seatbelt/ no  Sun protection/ yes Medication review/ New meds  Fall assessment/ no   Mobilization and Functional losses in the last year./  Sleep patterns 12:30 and up 9am; sleeps good; to the bathroom a couple of times   Urinary or fecal incontinence reviewed / no   Counseling: Colonoscopy; on [address]/ had 2 or 3; not associated with [place];  Had diverticular bleed and colon was perforated per the patient Dr. Benson [place]  EKG: has had one;  Hearing: some hearing; checked by audiology / some ringing in ears since tour in [place]  Ophthalmology exam; wet MD; in both eyes; Wet in left eye and is taking injections; vision is still go Vision has not changed (Dr. Herbert Deaner ) (Dr. [name])  Immunizations Due: had chicken  pox;    Current Care Team reviewed and updated      Objective:    Vitals: BP 130/70 mmHg  Pulse 54  Temp(Src) 97.7 F (36.5 C) (Oral)  Ht 6' (1.829 m)  Wt 177 lb 12 oz (80.627 kg)  BMI 24.10 kg/m2  SpO2 95%  Tobacco History  Smoking status  . Former Smoker -- 3.00 packs/day for 38 years  . Types: Cigarettes  . Quit date: 08/30/1990  Smokeless tobacco  . Not on file    Comment: states he quit 1986     Counseling given: Yes   Past Medical History  Diagnosis Date  . Osteoarthritis   . Depression   . Melanoma [facility]) 2009    x3 [name]  . Basal cell carcinoma     Right Chest  . Chronic insomnia     On ambien x 2 years  . Restless leg syndrome   . History of TIAs   . Dizzy spells   . Tinnitus   . Glaucoma   . Alcoholism ([place])     Dry 13 years  . Tobacco abuse   . Elbow fracture, left 2009    [name]  . Diverticulosis of colon   . LBP (low back pain)    Past Surgical History  Procedure Laterality Date  . Melanoma excision  2009    x3  . Cataract extraction    . Reconstruction medial collateral ligament elbow w/ tendon graft  2009    Left, [name]   Family History  Problem Relation Age of Onset  . Cancer Mother     ?breast  . Cancer Father     Lung   History  Sexual Activity  . Sexual Activity: Not Currently    Outpatient Encounter Prescriptions as of 07/30/2015  Medication Sig  . aspirin 81 MG EC tablet Take 81 mg by mouth daily.    . Cholecalciferol 1000 UNITS tablet Take 1,000 Units by mouth daily.    . Cyanocobalamin (VITAMIN B-12) 1000 MCG SUBL DISSOLVE 2 TABLETS IN MOUTH EVERY DAY  . dorzolamide (TRUSOPT) 2 % ophthalmic solution Place 1 drop into both eyes 2 (two) times daily.  . finasteride (PROSCAR) 5 MG tablet Take 1 tablet (5 mg total) by mouth daily.  [name] HYDROcodone-acetaminophen (NORCO) 7.5-325 MG tablet Take 1 tablet by mouth 4 (four) times daily as needed for severe pain. Please fill on or after 06/29/15  . latanoprost  (XALATAN) 0.005 % ophthalmic solution Place 1 drop into both eyes daily.  . meclizine (ANTIVERT) 12.5 MG tablet Take 1 tablet (12.5 mg total) by mouth 3 (three) times daily as needed for dizziness.  . Multiple Vitamin (MULTIVITAMIN) capsule Take 1 capsule by mouth daily.    . ondansetron (ZOFRAN) 4 MG tablet Take 1 tablet (4 mg total) by mouth every 8 (eight) hours as needed for nausea or vomiting.  . polyethylene glycol (GLYCOLAX/MIRALAX) powder Take 17 g by mouth daily as needed. For constipation   . tamsulosin (FLOMAX) 0.4 MG CAPS capsule Take 1 capsule (0.4 mg total) by mouth daily.  . travoprost, benzalkonium, (TRAVATAN) 0.004 % ophthalmic solution 1 drop at bedtime.    . triamcinolone (KENALOG) 0.5 % cream Apply topically daily. In eucerin lotion 1:10   . Desoximetasone (TOPICORT) 0.25 % ointment Apply topically 4 (four) times daily. Reported on 07/30/2015  . fluticasone (FLONASE) 50 MCG/ACT nasal spray Place 2 sprays into the nose daily.  [name] loratadine (CLARITIN) 10 MG tablet Take 1 tablet (10 mg total) by mouth daily as needed. For allergies (Patient not taking: Reported on 07/30/2015)  . rOPINIRole (REQUIP) 1 MG tablet Take 1-2 mg by mouth at bedtime as needed. Reported on 07/30/2015  . tadalafil (CIALIS) 20 MG tablet Take 10-20 mg by mouth as directed. Reported on 07/30/2015   No facility-administered encounter medications on file as of 07/30/2015.    Activities of Daily Living In your present state of health, do you have any difficulty performing the following activities: 07/30/2015 01/14/2015  Hearing? (No Data) N  Vision? Y Y  Difficulty concentrating or making decisions? N N  Walking or climbing stairs? N N  Dressing or bathing? N N  Doing errands, shopping? N N  Preparing Food and eating ? N -  Using the Toilet? N -  In the past six months, have you accidently leaked urine? N -  Do you have problems with loss of bowel control? N -  Managing your Medications? N -  Managing your  Finances? N -  Housekeeping or managing your Housekeeping? N -    Patient Care Team: [name], MD as PCP - General   Assessment:     Assessment   Patient presents for yearly preventative medicine examination. Medicare questionnaire screening were completed, i.e. Functional; fall risk; depression, memory loss and hearing were unremarkable   All immunizations and health maintenance protocols were reviewed with the patient will check with insurer for zostavax b or d for  oop.  Education provided for laboratory screens;  MD to review  Medication reconciliation, past medical history, social history, problem list and allergies were reviewed in detail with the patient  Goals were established with regard to weight loss, exercise, and diet in compliance with medications based on the patient individualized risk;   End of life planning was discussed and had been completed    Exercise Activities and Dietary recommendations Current Exercise Habits:: Home exercise routine (caregiver taking care of family )  Goals    . patient     Wants to stay healthy;  Be with grand-children      Fall Risk Fall Risk  07/30/2015 01/14/2015 10/21/2014  Falls in the past year? No No No   Depression Screen PHQ 2/9 Scores 07/30/2015 10/21/2014  PHQ - 2 Score 0 0    Cognitive Testing No flowsheet data found.  Immunization History  Administered Date(s) Administered  . Influenza Split 03/30/2011, 05/09/2012  . Influenza Whole 04/22/2008, 03/31/2009  . Influenza, High Dose Seasonal PF 05/07/2013  . Influenza,inj,Quad PF,36+ Mos 04/07/2014, 04/29/2015  . Pneumococcal Conjugate-13 06/13/2013  . Pneumococcal Polysaccharide-23 10/27/2009  . Td 06/23/2010   Screening Tests Health Maintenance  Topic Date Due  . ZOSTAVAX  05/12/1999  . INFLUENZA VACCINE  02/01/2016  . TETANUS/TDAP  06/23/2020  . PNA vac Low Risk Adult  Completed      Plan:    During the course of the visit the patient  was educated and counseled about the following appropriate screening and preventive services:   Vaccines to include Pneumoccal, Influenza, Hepatitis B, Td, Zostavax, HCV  Electrocardiogram/ Not sure when  Cardiovascular Disease/no issues  Colorectal cancer screening/ will check with prior doctor  Diabetes screening/neg  Prostate Cancer Screening neg  Glaucoma screening ; under tx for MD  Nutrition counseling / good BMI normal  Smoking cessation counseling/ quit;  Call to Dr. Carol Ada;  Patient Instructions (the written plan) was given to the patient.    Wynetta Fines, RN  07/30/2015

## 2015-07-30 NOTE — Progress Notes (Signed)
Subjective:  Patient ID: Justin [name]., male    DOB: October 18, 1940  Age: 75 y.o. MRN: [id number]  CC: Medicare Wellness   HPI Justin [name]. presents for a well exam. F/u LBP, OA, anxiety   Outpatient Prescriptions Prior to Visit  Medication Sig Dispense Refill  . aspirin 81 MG EC tablet Take 81 mg by mouth daily.      . Cholecalciferol 1000 UNITS tablet Take 1,000 Units by mouth daily.      . Cyanocobalamin (VITAMIN B-12) 1000 MCG SUBL DISSOLVE 2 TABLETS IN MOUTH EVERY DAY 60 tablet 8  . dorzolamide (TRUSOPT) 2 % ophthalmic solution Place 1 drop into both eyes 2 (two) times daily.    . finasteride (PROSCAR) 5 MG tablet Take 1 tablet (5 mg total) by mouth daily. 90 tablet 3  . latanoprost (XALATAN) 0.005 % ophthalmic solution Place 1 drop into both eyes daily.    . meclizine (ANTIVERT) 12.5 MG tablet Take 1 tablet (12.5 mg total) by mouth 3 (three) times daily as needed for dizziness. 60 tablet 1  . Multiple Vitamin (MULTIVITAMIN) capsule Take 1 capsule by mouth daily.      . ondansetron (ZOFRAN) 4 MG tablet Take 1 tablet (4 mg total) by mouth every 8 (eight) hours as needed for nausea or vomiting. 20 tablet 0  . polyethylene glycol (GLYCOLAX/MIRALAX) powder Take 17 g by mouth daily as needed. For constipation     . tamsulosin (FLOMAX) 0.4 MG CAPS capsule Take 1 capsule (0.4 mg total) by mouth daily. 90 capsule 3  . travoprost, benzalkonium, (TRAVATAN) 0.004 % ophthalmic solution 1 drop at bedtime.      . triamcinolone (KENALOG) 0.5 % cream Apply topically daily. In eucerin lotion 1:10     . HYDROcodone-acetaminophen (NORCO) 7.5-325 MG tablet Take 1 tablet by mouth 4 (four) times daily as needed for severe pain. Please fill on or after 06/29/15 120 tablet 0  . Desoximetasone (TOPICORT) 0.25 % ointment Apply topically 4 (four) times daily. Reported on 07/30/2015    . fluticasone (FLONASE) 50 MCG/ACT nasal spray Place 2 sprays into the nose daily. 16 g 6  . loratadine  (CLARITIN) 10 MG tablet Take 1 tablet (10 mg total) by mouth daily as needed. For allergies (Patient not taking: Reported on 07/30/2015) 100 tablet 3  . rOPINIRole (REQUIP) 1 MG tablet Take 1-2 mg by mouth at bedtime as needed. Reported on 07/30/2015    . tadalafil (CIALIS) 20 MG tablet Take 10-20 mg by mouth as directed. Reported on 07/30/2015     No facility-administered medications prior to visit.    ROS Review of Systems  Constitutional: Negative for appetite change, fatigue and unexpected weight change.  HENT: Negative for congestion, nosebleeds, sneezing, sore throat and trouble swallowing.   Eyes: Negative for itching and visual disturbance.  Respiratory: Negative for cough.   Cardiovascular: Negative for chest pain, palpitations and leg swelling.  Gastrointestinal: Negative for nausea, diarrhea, blood in stool and abdominal distention.  Genitourinary: Negative for frequency and hematuria.  Musculoskeletal: Positive for back pain and arthralgias. Negative for joint swelling, gait problem and neck pain.  Skin: Negative for rash.  Neurological: Negative for dizziness, tremors, speech difficulty and weakness.  Psychiatric/Behavioral: Negative for sleep disturbance, dysphoric mood and agitation. The patient is not nervous/anxious.     Objective:  BP 130/70 mmHg  Pulse 54  Temp(Src) 97.7 F (36.5 C) (Oral)  Ht 6' (1.829 m)  Wt 177 lb 12 oz (80.627 kg)  BMI 24.10 kg/m2  SpO2 95%  BP Readings from Last 3 Encounters:  07/30/15 130/70  04/29/15 140/78  02/09/15 144/80    Wt Readings from Last 3 Encounters:  07/30/15 177 lb 12 oz (80.627 kg)  04/29/15 178 lb (80.74 kg)  02/09/15 178 lb (80.74 kg)    Physical Exam  Constitutional: He is oriented to person, place, and time. He appears well-developed. No distress.  NAD  HENT:  Mouth/Throat: Oropharynx is clear and moist.  Eyes: Conjunctivae are normal. Pupils are equal, round, and reactive to light.  Neck: Normal range of  motion. No JVD present. No thyromegaly present.  Cardiovascular: Normal rate, regular rhythm, normal heart sounds and intact distal pulses.  Exam reveals no gallop and no friction rub.   No murmur heard. Pulmonary/Chest: Effort normal and breath sounds normal. No respiratory distress. He has no wheezes. He has no rales. He exhibits no tenderness.  Abdominal: Soft. Bowel sounds are normal. He exhibits no distension and no mass. There is no tenderness. There is no rebound and no guarding.  Musculoskeletal: Normal range of motion. He exhibits no edema or tenderness.  Lymphadenopathy:    He has no cervical adenopathy.  Neurological: He is alert and oriented to person, place, and time. He has normal reflexes. No cranial nerve deficit. He exhibits normal muscle tone. He displays a negative Romberg sign. Coordination and gait normal.  Skin: Skin is warm and dry. No rash noted.  Psychiatric: He has a normal mood and affect. His behavior is normal. Judgment and thought content normal.    Lab Results  Component Value Date   WBC 6.8 07/28/2015   HGB 11.0* 07/28/2015   HCT 33.5* 07/28/2015   PLT 226.0 07/28/2015   GLUCOSE 100* 07/28/2015   CHOL 131 07/28/2015   TRIG 96.0 07/28/2015   HDL 49.80 07/28/2015   LDLCALC 62 07/28/2015   ALT 9 07/28/2015   AST 15 07/28/2015   NA 139 07/28/2015   K 4.5 07/28/2015   CL 106 07/28/2015   CREATININE 1.07 07/28/2015   BUN 21 07/28/2015   CO2 28 07/28/2015   TSH 2.15 07/28/2015   PSA 0.47 07/28/2015   HGBA1C 5.6 07/28/2015    Dg Chest 2 View  03/06/2013  *RADIOLOGY REPORT* Clinical Data: Cough and history of tobacco use. CHEST - 2 VIEW Comparison: 12/19/2007 Findings: Stable probable mild chronic lung disease.  No evidence of edema, infiltrate, nodule or pleural fluid.  The heart size and mediastinal contours are within normal limits.  Bony thorax is unremarkable. IMPRESSION: Probable mild chronic lung disease.  No acute findings. Original Report  Authenticated By: Aletta Edouard, M.D.    Assessment & Plan:   Justin [name] was seen today for medicare wellness.  Diagnoses and all orders for this visit:  Vitamin B12 deficiency  Depression  Chronic midline low back pain with sciatica, sciatica laterality unspecified  Osteoarthritis, unspecified osteoarthritis type, unspecified site  Vitamin D deficiency  Well adult exam  Other orders -     Discontinue: HYDROcodone-acetaminophen (NORCO) 7.5-325 MG tablet; Take 1 tablet by mouth 4 (four) times daily as needed for severe pain. Please fill on or after 07/30/15 -     Discontinue: HYDROcodone-acetaminophen (NORCO) 7.5-325 MG tablet; Take 1 tablet by mouth 4 (four) times daily as needed for severe pain. Please fill on or after 08/30/15 -     HYDROcodone-acetaminophen (NORCO) 7.5-325 MG tablet; Take 1 tablet by mouth 4 (four) times daily as needed for severe pain. Please fill on  or after 09/27/15 -     ipratropium (ATROVENT) 0.06 % nasal spray; Place 2 sprays into the nose 3 (three) times daily.   I have discontinued Mr. Height's HYDROcodone-acetaminophen and HYDROcodone-acetaminophen. I have also changed his HYDROcodone-acetaminophen. Additionally, I am having him start on ipratropium. Lastly, I am having him maintain his aspirin, tadalafil, polyethylene glycol powder, multivitamin, rOPINIRole, Desoximetasone, travoprost (benzalkonium), triamcinolone cream, Cholecalciferol, loratadine, fluticasone, finasteride, tamsulosin, dorzolamide, latanoprost, ondansetron, meclizine, and Vitamin B-12.  Meds ordered this encounter  Medications  . DISCONTD: HYDROcodone-acetaminophen (NORCO) 7.5-325 MG tablet    Sig: Take 1 tablet by mouth 4 (four) times daily as needed for severe pain. Please fill on or after 07/30/15    Dispense:  120 tablet    Refill:  0  . DISCONTD: HYDROcodone-acetaminophen (NORCO) 7.5-325 MG tablet    Sig: Take 1 tablet by mouth 4 (four) times daily as needed for severe pain. Please  fill on or after 08/30/15    Dispense:  120 tablet    Refill:  0  . HYDROcodone-acetaminophen (NORCO) 7.5-325 MG tablet    Sig: Take 1 tablet by mouth 4 (four) times daily as needed for severe pain. Please fill on or after 09/27/15    Dispense:  120 tablet    Refill:  0  . ipratropium (ATROVENT) 0.06 % nasal spray    Sig: Place 2 sprays into the nose 3 (three) times daily.    Dispense:  15 mL    Refill:  2     Follow-up: Return in about 3 months (around 10/28/2015) for a follow-up visit.  [name], MD

## 2015-07-30 NOTE — Telephone Encounter (Signed)
-  Call to Dr. Benson [place] office  And states they have record of endoscopy but no endoscopy per staff.

## 2015-07-30 NOTE — Patient Instructions (Addendum)
[name] , Thank you for taking time to come for your Medicare Wellness Visit. I appreciate your ongoing commitment to your health goals. Please review the following plan we discussed and let me know if I can assist you in the future.  Will try to get copy of colonosocpy Dr. Benson [place]  These are the goals we discussed: Goals    . patient     Wants to stay healthy;  Be with grand-children       This is a list of the screening recommended for you and due dates:  Health Maintenance  Topic Date Due  . Shingles Vaccine  05/12/1999  . Flu Shot  02/01/2016  . Tetanus Vaccine  06/23/2020  . Pneumonia vaccines  Completed    Preventive Care for Adults, Male A healthy lifestyle and preventive care can promote health and wellness. Preventive health guidelines for men include the following key practices:  A routine yearly physical is a good way to check with your health care provider about your health and preventative screening. It is a chance to share any concerns and updates on your health and to receive a thorough exam.  Visit your dentist for a routine exam and preventative care every 6 months. Brush your teeth twice a day and floss once a day. Good oral hygiene prevents tooth decay and gum disease.  The frequency of eye exams is based on your age, health, family medical history, use of contact lenses, and other factors. Follow your health care provider's recommendations for frequency of eye exams.  Eat a healthy diet. Foods such as vegetables, fruits, whole grains, low-fat dairy products, and lean protein foods contain the nutrients you need without too many calories. Decrease your intake of foods high in solid fats, added sugars, and salt. Eat the right amount of calories for you.Get information about a proper diet from your health care provider, if necessary.  Regular physical exercise is one of the most important things you can do for your health. Most adults should get at least 150  minutes of moderate-intensity exercise (any activity that increases your heart rate and causes you to sweat) each week. In addition, most adults need muscle-strengthening exercises on 2 or more days a week.  Maintain a healthy weight. The body mass index (BMI) is a screening tool to identify possible weight problems. It provides an estimate of body fat based on height and weight. Your health care provider can find your BMI and can help you achieve or maintain a healthy weight.For adults 20 years and older:  A BMI below 18.5 is considered underweight.  A BMI of 18.5 to 24.9 is normal.  A BMI of 25 to 29.9 is considered overweight.  A BMI of 30 and above is considered obese.  Maintain normal blood lipids and cholesterol levels by exercising and minimizing your intake of saturated fat. Eat a balanced diet with plenty of fruit and vegetables. Blood tests for lipids and cholesterol should begin at age 64 and be repeated every 5 years. If your lipid or cholesterol levels are high, you are over 50, or you are at high risk for heart disease, you may need your cholesterol levels checked more frequently.Ongoing high lipid and cholesterol levels should be treated with medicines if diet and exercise are not working.  If you smoke, find out from your health care provider how to quit. If you do not use tobacco, do not start.  Lung cancer screening is recommended for adults aged 67-80 years who  are at high risk for developing lung cancer because of a history of smoking. A yearly low-dose CT scan of the lungs is recommended for people who have at least a 30-pack-year history of smoking and are a current smoker or have quit within the past 15 years. A pack year of smoking is smoking an average of 1 pack of cigarettes a day for 1 year (for example: 1 pack a day for 30 years or 2 packs a day for 15 years). Yearly screening should continue until the smoker has stopped smoking for at least 15 years. Yearly screening  should be stopped for people who develop a health problem that would prevent them from having lung cancer treatment.  If you choose to drink alcohol, do not have more than 2 drinks per day. One drink is considered to be 12 ounces (355 mL) of beer, 5 ounces (148 mL) of wine, or 1.5 ounces (44 mL) of liquor.  Avoid use of street drugs. Do not share needles with anyone. Ask for help if you need support or instructions about stopping the use of drugs.  High blood pressure causes heart disease and increases the risk of stroke. Your blood pressure should be checked at least every 1-2 years. Ongoing high blood pressure should be treated with medicines, if weight loss and exercise are not effective.  If you are 23-8 years old, ask your health care provider if you should take aspirin to prevent heart disease.  Diabetes screening is done by taking a blood sample to check your blood glucose level after you have not eaten for a certain period of time (fasting). If you are not overweight and you do not have risk factors for diabetes, you should be screened once every 3 years starting at age 33. If you are overweight or obese and you are 68-58 years of age, you should be screened for diabetes every year as part of your cardiovascular risk assessment.  Colorectal cancer can be detected and often prevented. Most routine colorectal cancer screening begins at the age of 32 and continues through age 50. However, your health care provider may recommend screening at an earlier age if you have risk factors for colon cancer. On a yearly basis, your health care provider may provide home test kits to check for hidden blood in the stool. Use of a small camera at the end of a tube to directly examine the colon (sigmoidoscopy or colonoscopy) can detect the earliest forms of colorectal cancer. Talk to your health care provider about this at age 34, when routine screening begins. Direct exam of the colon should be repeated every  5-10 years through age 16, unless early forms of precancerous polyps or small growths are found.  People who are at an increased risk for hepatitis B should be screened for this virus. You are considered at high risk for hepatitis B if:  You were born in a country where hepatitis B occurs often. Talk with your health care provider about which countries are considered high risk.  Your parents were born in a high-risk country and you have not received a shot to protect against hepatitis B (hepatitis B vaccine).  You have HIV or AIDS.  You use needles to inject street drugs.  You live with, or have sex with, someone who has hepatitis B.  You are a man who has sex with other men (MSM).  You get hemodialysis treatment.  You take certain medicines for conditions such as cancer, organ transplantation, and  autoimmune conditions.  Hepatitis C blood testing is recommended for all people born from [birth year] through 1965 and any individual with known risks for hepatitis C.  Practice safe sex. Use condoms and avoid high-risk sexual practices to reduce the spread of sexually transmitted infections (STIs). STIs include gonorrhea, chlamydia, syphilis, trichomonas, herpes, HPV, and human immunodeficiency virus (HIV). Herpes, HIV, and HPV are viral illnesses that have no cure. They can result in disability, cancer, and death.  If you are a man who has sex with other men, you should be screened at least once per year for:  HIV.  Urethral, rectal, and pharyngeal infection of gonorrhea, chlamydia, or both.  If you are at risk of being infected with HIV, it is recommended that you take a prescription medicine daily to prevent HIV infection. This is called preexposure prophylaxis (PrEP). You are considered at risk if:  You are a man who has sex with other men (MSM) and have other risk factors.  You are a heterosexual man, are sexually active, and are at increased risk for HIV infection.  You take drugs by  injection.  You are sexually active with a partner who has HIV.  Talk with your health care provider about whether you are at high risk of being infected with HIV. If you choose to begin PrEP, you should first be tested for HIV. You should then be tested every 3 months for as long as you are taking PrEP.  A one-time screening for abdominal aortic aneurysm (AAA) and surgical repair of large AAAs by ultrasound are recommended for men ages 66 to 1 years who are current or former smokers.  Healthy men should no longer receive prostate-specific antigen (PSA) blood tests as part of routine cancer screening. Talk with your health care provider about prostate cancer screening.  Testicular cancer screening is not recommended for adult males who have no symptoms. Screening includes self-exam, a health care provider exam, and other screening tests. Consult with your health care provider about any symptoms you have or any concerns you have about testicular cancer.  Use sunscreen. Apply sunscreen liberally and repeatedly throughout the day. You should seek shade when your shadow is shorter than you. Protect yourself by wearing long sleeves, pants, a wide-brimmed hat, and sunglasses year round, whenever you are outdoors.  Once a month, do a whole-body skin exam, using a mirror to look at the skin on your back. Tell your health care provider about new moles, moles that have irregular borders, moles that are larger than a pencil eraser, or moles that have changed in shape or color.  Stay current with required vaccines (immunizations).  Influenza vaccine. All adults should be immunized every year.  Tetanus, diphtheria, and acellular pertussis (Td, Tdap) vaccine. An adult who has not previously received Tdap or who does not know his vaccine status should receive 1 dose of Tdap. This initial dose should be followed by tetanus and diphtheria toxoids (Td) booster doses every 10 years. Adults with an unknown or  incomplete history of completing a 3-dose immunization series with Td-containing vaccines should begin or complete a primary immunization series including a Tdap dose. Adults should receive a Td booster every 10 years.  Varicella vaccine. An adult without evidence of immunity to varicella should receive 2 doses or a second dose if he has previously received 1 dose.  Human papillomavirus (HPV) vaccine. Males aged 11-21 years who have not received the vaccine previously should receive the 3-dose series. Males aged 22-26 years may  be immunized. Immunization is recommended through the age of 95 years for any male who has sex with males and did not get any or all doses earlier. Immunization is recommended for any person with an immunocompromised condition through the age of 46 years if he did not get any or all doses earlier. During the 3-dose series, the second dose should be obtained 4-8 weeks after the first dose. The third dose should be obtained 24 weeks after the first dose and 16 weeks after the second dose.  Zoster vaccine. One dose is recommended for adults aged 58 years or older unless certain conditions are present.  Measles, mumps, and rubella (MMR) vaccine. Adults born before [birth year] generally are considered immune to measles and mumps. Adults born in [birth year] or later should have 1 or more doses of MMR vaccine unless there is a contraindication to the vaccine or there is laboratory evidence of immunity to each of the three diseases. A routine second dose of MMR vaccine should be obtained at least 28 days after the first dose for students attending postsecondary schools, health care workers, or international travelers. People who received inactivated measles vaccine or an unknown type of measles vaccine during 1963-1967 should receive 2 doses of MMR vaccine. People who received inactivated mumps vaccine or an unknown type of mumps vaccine before 1979 and are at high risk for mumps infection should consider  immunization with 2 doses of MMR vaccine. Unvaccinated health care workers born before [birth year] who lack laboratory evidence of measles, mumps, or rubella immunity or laboratory confirmation of disease should consider measles and mumps immunization with 2 doses of MMR vaccine or rubella immunization with 1 dose of MMR vaccine.  Pneumococcal 13-valent conjugate (PCV13) vaccine. When indicated, a person who is uncertain of his immunization history and has no record of immunization should receive the PCV13 vaccine. All adults 70 years of age and older should receive this vaccine. An adult aged 14 years or older who has certain medical conditions and has not been previously immunized should receive 1 dose of PCV13 vaccine. This PCV13 should be followed with a dose of pneumococcal polysaccharide (PPSV23) vaccine. Adults who are at high risk for pneumococcal disease should obtain the PPSV23 vaccine at least 8 weeks after the dose of PCV13 vaccine. Adults older than 77 years of age who have normal immune system function should obtain the PPSV23 vaccine dose at least 1 year after the dose of PCV13 vaccine.  Pneumococcal polysaccharide (PPSV23) vaccine. When PCV13 is also indicated, PCV13 should be obtained first. All adults aged 11 years and older should be immunized. An adult younger than age 63 years who has certain medical conditions should be immunized. Any person who resides in a nursing home or long-term care facility should be immunized. An adult smoker should be immunized. People with an immunocompromised condition and certain other conditions should receive both PCV13 and PPSV23 vaccines. People with human immunodeficiency virus (HIV) infection should be immunized as soon as possible after diagnosis. Immunization during chemotherapy or radiation therapy should be avoided. Routine use of PPSV23 vaccine is not recommended for American Indians, [place] Natives, or people younger than 65 years unless there are medical  conditions that require PPSV23 vaccine. When indicated, people who have unknown immunization and have no record of immunization should receive PPSV23 vaccine. One-time revaccination 5 years after the first dose of PPSV23 is recommended for people aged 19-64 years who have chronic kidney failure, nephrotic syndrome, asplenia, or immunocompromised conditions. People who received  1-2 doses of PPSV23 before age 76 years should receive another dose of PPSV23 vaccine at age 35 years or later if at least 5 years have passed since the previous dose. Doses of PPSV23 are not needed for people immunized with PPSV23 at or after age 32 years.  Meningococcal vaccine. Adults with asplenia or persistent complement component deficiencies should receive 2 doses of quadrivalent meningococcal conjugate (MenACWY-D) vaccine. The doses should be obtained at least 2 months apart. Microbiologists working with certain meningococcal bacteria, [place] recruits, people at risk during an outbreak, and people who travel to or live in countries with a high rate of meningitis should be immunized. A first-year college student up through age 83 years who is living in a residence hall should receive a dose if he did not receive a dose on or after his 16th birthday. Adults who have certain high-risk conditions should receive one or more doses of vaccine.  Hepatitis A vaccine. Adults who wish to be protected from this disease, have chronic liver disease, work with hepatitis A-infected animals, work in hepatitis A research labs, or travel to or work in countries with a high rate of hepatitis A should be immunized. Adults who were previously unvaccinated and who anticipate close contact with an international adoptee during the first 60 days after arrival in the [place] from a country with a high rate of hepatitis A should be immunized.  Hepatitis B vaccine. Adults should be immunized if they wish to be protected from this disease, are under  age 39 years and have diabetes, have chronic liver disease, have had more than one sex partner in the past 6 months, may be exposed to blood or other infectious body fluids, are household contacts or sex partners of hepatitis B positive people, are clients or workers in certain care facilities, or travel to or work in countries with a high rate of hepatitis B.  Haemophilus influenzae type b (Hib) vaccine. A previously unvaccinated person with asplenia or sickle cell disease or having a scheduled splenectomy should receive 1 dose of Hib vaccine. Regardless of previous immunization, a recipient of a hematopoietic stem cell transplant should receive a 3-dose series 6-12 months after his successful transplant. Hib vaccine is not recommended for adults with HIV infection. Preventive Service / Frequency Ages 22 to 59  Blood pressure check.** / Every 3-5 years.  Lipid and cholesterol check.** / Every 5 years beginning at age 41.  Hepatitis C blood test.** / For any individual with known risks for hepatitis C.  Skin self-exam. / Monthly.  Influenza vaccine. / Every year.  Tetanus, diphtheria, and acellular pertussis (Tdap, Td) vaccine.** / Consult your health care provider. 1 dose of Td every 10 years.  Varicella vaccine.** / Consult your health care provider.  HPV vaccine. / 3 doses over 6 months, if 37 or younger.  Measles, mumps, rubella (MMR) vaccine.** / You need at least 1 dose of MMR if you were born in 1957 or later. You may also need a second dose.  Pneumococcal 13-valent conjugate (PCV13) vaccine.** / Consult your health care provider.  Pneumococcal polysaccharide (PPSV23) vaccine.** / 1 to 2 doses if you smoke cigarettes or if you have certain conditions.  Meningococcal vaccine.** / 1 dose if you are age 37 to 44 years and a Market researcher living in a residence hall, or have one of several medical conditions. You may also need additional booster doses.  Hepatitis A  vaccine.** / Consult your health care provider.  Hepatitis B vaccine.** / Consult your health care provider.  Haemophilus influenzae type b (Hib) vaccine.** / Consult your health care provider. Ages 18 to 37  Blood pressure check.** / Every year.  Lipid and cholesterol check.** / Every 5 years beginning at age 67.  Lung cancer screening. / Every year if you are aged 51-80 years and have a 30-pack-year history of smoking and currently smoke or have quit within the past 15 years. Yearly screening is stopped once you have quit smoking for at least 15 years or develop a health problem that would prevent you from having lung cancer treatment.  Fecal occult blood test (FOBT) of stool. / Every year beginning at age 66 and continuing until age 2. You may not have to do this test if you get a colonoscopy every 10 years.  Flexible sigmoidoscopy** or colonoscopy.** / Every 5 years for a flexible sigmoidoscopy or every 10 years for a colonoscopy beginning at age 76 and continuing until age 61.  Hepatitis C blood test.** / For all people born from [birth year] through 1965 and any individual with known risks for hepatitis C.  Skin self-exam. / Monthly.  Influenza vaccine. / Every year.  Tetanus, diphtheria, and acellular pertussis (Tdap/Td) vaccine.** / Consult your health care provider. 1 dose of Td every 10 years.  Varicella vaccine.** / Consult your health care provider.  Zoster vaccine.** / 1 dose for adults aged 69 years or older.  Measles, mumps, rubella (MMR) vaccine.** / You need at least 1 dose of MMR if you were born in 1959 or later. You may also need a second dose.  Pneumococcal 13-valent conjugate (PCV13) vaccine.** / Consult your health care provider.  Pneumococcal polysaccharide (PPSV23) vaccine.** / 1 to 2 doses if you smoke cigarettes or if you have certain conditions.  Meningococcal vaccine.** / Consult your health care provider.  Hepatitis A vaccine.** / Consult your health care  provider.  Hepatitis B vaccine.** / Consult your health care provider.  Haemophilus influenzae type b (Hib) vaccine.** / Consult your health care provider. Ages 65 and over  Blood pressure check.** / Every year.  Lipid and cholesterol check.**/ Every 5 years beginning at age 19.  Lung cancer screening. / Every year if you are aged 66-80 years and have a 30-pack-year history of smoking and currently smoke or have quit within the past 15 years. Yearly screening is stopped once you have quit smoking for at least 15 years or develop a health problem that would prevent you from having lung cancer treatment.  Fecal occult blood test (FOBT) of stool. / Every year beginning at age 31 and continuing until age 80. You may not have to do this test if you get a colonoscopy every 10 years.  Flexible sigmoidoscopy** or colonoscopy.** / Every 5 years for a flexible sigmoidoscopy or every 10 years for a colonoscopy beginning at age 58 and continuing until age 29.  Hepatitis C blood test.** / For all people born from [birth year] through 1965 and any individual with known risks for hepatitis C.  Abdominal aortic aneurysm (AAA) screening.** / A one-time screening for ages 76 to 12 years who are current or former smokers.  Skin self-exam. / Monthly.  Influenza vaccine. / Every year.  Tetanus, diphtheria, and acellular pertussis (Tdap/Td) vaccine.** / 1 dose of Td every 10 years.  Varicella vaccine.** / Consult your health care provider.  Zoster vaccine.** / 1 dose for adults aged 72 years or older.  Pneumococcal 13-valent conjugate (PCV13) vaccine.** /  1 dose for all adults aged 66 years and older.  Pneumococcal polysaccharide (PPSV23) vaccine.** / 1 dose for all adults aged 58 years and older.  Meningococcal vaccine.** / Consult your health care provider.  Hepatitis A vaccine.** / Consult your health care provider.  Hepatitis B vaccine.** / Consult your health care provider.  Haemophilus influenzae  type b (Hib) vaccine.** / Consult your health care provider. **Family history and personal history of risk and conditions may change your health care provider's recommendations.   This information is not intended to replace advice given to you by your health care provider. Make sure you discuss any questions you have with your health care provider.   Document Released: 08/15/2001 Document Revised: 07/10/2014 Document Reviewed: 11/14/2010 Elsevier Interactive Patient Education [employer].

## 2015-07-30 NOTE — Assessment & Plan Note (Signed)

## 2015-08-11 DIAGNOSIS — H402222 Chronic angle-closure glaucoma, left eye, moderate stage: Secondary | ICD-10-CM | POA: Diagnosis not present

## 2015-08-11 DIAGNOSIS — H402211 Chronic angle-closure glaucoma, right eye, mild stage: Secondary | ICD-10-CM | POA: Diagnosis not present

## 2015-08-16 DIAGNOSIS — H353221 Exudative age-related macular degeneration, left eye, with active choroidal neovascularization: Secondary | ICD-10-CM | POA: Diagnosis not present

## 2015-08-25 DIAGNOSIS — Z85828 Personal history of other malignant neoplasm of skin: Secondary | ICD-10-CM | POA: Diagnosis not present

## 2015-08-25 DIAGNOSIS — D2371 Other benign neoplasm of skin of right lower limb, including hip: Secondary | ICD-10-CM | POA: Diagnosis not present

## 2015-08-25 DIAGNOSIS — D1801 Hemangioma of skin and subcutaneous tissue: Secondary | ICD-10-CM | POA: Diagnosis not present

## 2015-08-25 DIAGNOSIS — D225 Melanocytic nevi of trunk: Secondary | ICD-10-CM | POA: Diagnosis not present

## 2015-08-25 DIAGNOSIS — D485 Neoplasm of uncertain behavior of skin: Secondary | ICD-10-CM | POA: Diagnosis not present

## 2015-08-25 DIAGNOSIS — L821 Other seborrheic keratosis: Secondary | ICD-10-CM | POA: Diagnosis not present

## 2015-08-25 DIAGNOSIS — D224 Melanocytic nevi of scalp and neck: Secondary | ICD-10-CM | POA: Diagnosis not present

## 2015-08-25 DIAGNOSIS — D2372 Other benign neoplasm of skin of left lower limb, including hip: Secondary | ICD-10-CM | POA: Diagnosis not present

## 2015-08-25 DIAGNOSIS — L570 Actinic keratosis: Secondary | ICD-10-CM | POA: Diagnosis not present

## 2015-09-20 DIAGNOSIS — H353221 Exudative age-related macular degeneration, left eye, with active choroidal neovascularization: Secondary | ICD-10-CM | POA: Diagnosis not present

## 2015-09-20 DIAGNOSIS — H353131 Nonexudative age-related macular degeneration, bilateral, early dry stage: Secondary | ICD-10-CM | POA: Diagnosis not present

## 2015-10-12 ENCOUNTER — Other Ambulatory Visit: Payer: Self-pay | Admitting: Internal Medicine

## 2015-10-26 DIAGNOSIS — H353221 Exudative age-related macular degeneration, left eye, with active choroidal neovascularization: Secondary | ICD-10-CM | POA: Diagnosis not present

## 2015-10-29 ENCOUNTER — Encounter: Payer: Self-pay | Admitting: Internal Medicine

## 2015-10-29 ENCOUNTER — Ambulatory Visit (INDEPENDENT_AMBULATORY_CARE_PROVIDER_SITE_OTHER): Payer: PPO | Admitting: Internal Medicine

## 2015-10-29 VITALS — BP 150/90 | HR 49 | Wt 175.0 lb

## 2015-10-29 DIAGNOSIS — E538 Deficiency of other specified B group vitamins: Secondary | ICD-10-CM | POA: Diagnosis not present

## 2015-10-29 DIAGNOSIS — G8929 Other chronic pain: Secondary | ICD-10-CM | POA: Diagnosis not present

## 2015-10-29 DIAGNOSIS — N400 Enlarged prostate without lower urinary tract symptoms: Secondary | ICD-10-CM | POA: Diagnosis not present

## 2015-10-29 DIAGNOSIS — M5440 Lumbago with sciatica, unspecified side: Secondary | ICD-10-CM

## 2015-10-29 MED ORDER — HYDROCODONE-ACETAMINOPHEN 7.5-325 MG PO TABS: 1.0000 | ORAL_TABLET | Freq: Four times a day (QID) | ORAL | Status: DC | PRN

## 2015-10-29 NOTE — Assessment & Plan Note (Signed)
Proscar 

## 2015-10-29 NOTE — Progress Notes (Signed)
Subjective:  Patient ID: [name], male    DOB: [birth year]  Age: 75 y.o. MRN: [id number]  CC: No chief complaint on file.   HPI Omir Griffin Krog Jr. presents for OA, BPH, HTN f/up  Outpatient Prescriptions Prior to Visit  Medication Sig Dispense Refill  . aspirin 81 MG EC tablet Take 81 mg by mouth daily.      . Cholecalciferol 1000 UNITS tablet Take 1,000 Units by mouth daily.      . Cyanocobalamin (VITAMIN B-12) 1000 MCG SUBL DISSOLVE 2 TABLETS IN MOUTH EVERY DAY 60 tablet 8  . Desoximetasone (TOPICORT) 0.25 % ointment Apply topically 4 (four) times daily. Reported on 07/30/2015    . dorzolamide (TRUSOPT) 2 % ophthalmic solution Place 1 drop into both eyes 2 (two) times daily.    . finasteride (PROSCAR) 5 MG tablet Take 1 tablet (5 mg total) by mouth daily. 90 tablet 3  . finasteride (PROSCAR) 5 MG tablet TAKE 1 TABLET (5 MG TOTAL) BY MOUTH DAILY. 90 tablet 1  . HYDROcodone-acetaminophen (NORCO) 7.5-325 MG tablet Take 1 tablet by mouth 4 (four) times daily as needed for severe pain. Please fill on or after 09/27/15 120 tablet 0  . ipratropium (ATROVENT) 0.06 % nasal spray Place 2 sprays into the nose 3 (three) times daily. 15 mL 2  . latanoprost (XALATAN) 0.005 % ophthalmic solution Place 1 drop into both eyes daily.    [name] loratadine (CLARITIN) 10 MG tablet Take 1 tablet (10 mg total) by mouth daily as needed. For allergies 100 tablet 3  . meclizine (ANTIVERT) 12.5 MG tablet Take 1 tablet (12.5 mg total) by mouth 3 (three) times daily as needed for dizziness. 60 tablet 1  . Multiple Vitamin (MULTIVITAMIN) capsule Take 1 capsule by mouth daily.      . ondansetron (ZOFRAN) 4 MG tablet Take 1 tablet (4 mg total) by mouth every 8 (eight) hours as needed for nausea or vomiting. 20 tablet 0  . polyethylene glycol (GLYCOLAX/MIRALAX) powder Take 17 g by mouth daily as needed. For constipation     . rOPINIRole (REQUIP) 1 MG tablet Take 1-2 mg by mouth at bedtime as needed. Reported  on 07/30/2015    . tadalafil (CIALIS) 20 MG tablet Take 10-20 mg by mouth as directed. Reported on 07/30/2015    . tamsulosin (FLOMAX) 0.4 MG CAPS capsule Take 1 capsule (0.4 mg total) by mouth daily. 90 capsule 3  . travoprost, benzalkonium, (TRAVATAN) 0.004 % ophthalmic solution 1 drop at bedtime.      . triamcinolone (KENALOG) 0.5 % cream Apply topically daily. In eucerin lotion 1:10     . fluticasone (FLONASE) 50 MCG/ACT nasal spray Place 2 sprays into the nose daily. 16 g 6   No facility-administered medications prior to visit.    ROS Review of Systems  Constitutional: Negative for appetite change, fatigue and unexpected weight change.  HENT: Negative for congestion, nosebleeds, sneezing, sore throat and trouble swallowing.   Eyes: Negative for itching and visual disturbance.  Respiratory: Negative for cough.   Cardiovascular: Negative for chest pain, palpitations and leg swelling.  Gastrointestinal: Negative for nausea, diarrhea, blood in stool and abdominal distention.  Genitourinary: Positive for frequency. Negative for hematuria.  Musculoskeletal: Positive for arthralgias and gait problem. Negative for back pain, joint swelling and neck pain.  Skin: Negative for rash.  Neurological: Negative for dizziness, tremors, speech difficulty and weakness.  Psychiatric/Behavioral: Negative for sleep disturbance, dysphoric mood and agitation. The patient is  not nervous/anxious.     Objective:  BP 150/90 mmHg  Pulse 49  Wt 175 lb (79.379 kg)  SpO2 96%  BP Readings from Last 3 Encounters:  10/29/15 150/90  07/30/15 130/70  04/29/15 140/78    Wt Readings from Last 3 Encounters:  10/29/15 175 lb (79.379 kg)  07/30/15 177 lb 12 oz (80.627 kg)  04/29/15 178 lb (80.74 kg)    Physical Exam  Constitutional: He is oriented to person, place, and time. He appears well-developed. No distress.  NAD  HENT:  Mouth/Throat: Oropharynx is clear and moist.  Eyes: Conjunctivae are normal.  Pupils are equal, round, and reactive to light.  Neck: Normal range of motion. No JVD present. No thyromegaly present.  Cardiovascular: Normal rate, regular rhythm, normal heart sounds and intact distal pulses.  Exam reveals no gallop and no friction rub.   No murmur heard. Pulmonary/Chest: Effort normal and breath sounds normal. No respiratory distress. He has no wheezes. He has no rales. He exhibits no tenderness.  Abdominal: Soft. Bowel sounds are normal. He exhibits no distension and no mass. There is no tenderness. There is no rebound and no guarding.  Musculoskeletal: Normal range of motion. He exhibits tenderness. He exhibits no edema.  Lymphadenopathy:    He has no cervical adenopathy.  Neurological: He is alert and oriented to person, place, and time. He has normal reflexes. No cranial nerve deficit. He exhibits normal muscle tone. He displays a negative Romberg sign. Coordination abnormal. Gait normal.  Skin: Skin is warm and dry. No rash noted.  Psychiatric: He has a normal mood and affect. His behavior is normal. Judgment and thought content normal.  OA changes  cologuard info given  Lab Results  Component Value Date   WBC 6.8 07/28/2015   HGB 11.0* 07/28/2015   HCT 33.5* 07/28/2015   PLT 226.0 07/28/2015   GLUCOSE 100* 07/28/2015   CHOL 131 07/28/2015   TRIG 96.0 07/28/2015   HDL 49.80 07/28/2015   LDLCALC 62 07/28/2015   ALT 9 07/28/2015   AST 15 07/28/2015   NA 139 07/28/2015   K 4.5 07/28/2015   CL 106 07/28/2015   CREATININE 1.07 07/28/2015   BUN 21 07/28/2015   CO2 28 07/28/2015   TSH 2.15 07/28/2015   PSA 0.47 07/28/2015   HGBA1C 5.6 07/28/2015    Dg Chest 2 View  03/06/2013  *RADIOLOGY REPORT* Clinical Data: Cough and history of tobacco use. CHEST - 2 VIEW Comparison: 12/19/2007 Findings: Stable probable mild chronic lung disease.  No evidence of edema, infiltrate, nodule or pleural fluid.  The heart size and mediastinal contours are within normal limits.   Bony thorax is unremarkable. IMPRESSION: Probable mild chronic lung disease.  No acute findings. Original Report Authenticated By: Aletta Edouard, M.D.    Assessment & Plan:   There are no diagnoses linked to this encounter. I am having Mr. Ouellet maintain his aspirin, tadalafil, polyethylene glycol powder, multivitamin, rOPINIRole, Desoximetasone, travoprost (benzalkonium), triamcinolone cream, Cholecalciferol, loratadine, fluticasone, finasteride, tamsulosin, dorzolamide, latanoprost, ondansetron, meclizine, Vitamin B-12, HYDROcodone-acetaminophen, ipratropium, and finasteride.  No orders of the defined types were placed in this encounter.     Follow-up: No Follow-up on file.  [name], MD

## 2015-10-29 NOTE — Assessment & Plan Note (Signed)
Chronic  On Norco  Potential benefits of a long term opioids use as well as potential risks (i.e. addiction risk, apnea etc) and complications (i.e. Somnolence, constipation and others) were explained to the patient and were aknowledged. 

## 2015-10-29 NOTE — Progress Notes (Signed)
Pre visit review using our clinic review tool, if applicable. No additional management support is needed unless otherwise documented below in the visit note. 

## 2015-10-29 NOTE — Assessment & Plan Note (Signed)
On B12 

## 2015-11-12 ENCOUNTER — Other Ambulatory Visit: Payer: Self-pay | Admitting: Internal Medicine

## 2015-12-01 DIAGNOSIS — H353221 Exudative age-related macular degeneration, left eye, with active choroidal neovascularization: Secondary | ICD-10-CM | POA: Diagnosis not present

## 2015-12-01 DIAGNOSIS — H353131 Nonexudative age-related macular degeneration, bilateral, early dry stage: Secondary | ICD-10-CM | POA: Diagnosis not present

## 2016-01-05 DIAGNOSIS — H353221 Exudative age-related macular degeneration, left eye, with active choroidal neovascularization: Secondary | ICD-10-CM | POA: Diagnosis not present

## 2016-01-25 ENCOUNTER — Ambulatory Visit: Payer: PPO | Admitting: Internal Medicine

## 2016-01-26 DIAGNOSIS — H353113 Nonexudative age-related macular degeneration, right eye, advanced atrophic without subfoveal involvement: Secondary | ICD-10-CM | POA: Diagnosis not present

## 2016-01-26 DIAGNOSIS — H402211 Chronic angle-closure glaucoma, right eye, mild stage: Secondary | ICD-10-CM | POA: Diagnosis not present

## 2016-01-26 DIAGNOSIS — H402222 Chronic angle-closure glaucoma, left eye, moderate stage: Secondary | ICD-10-CM | POA: Diagnosis not present

## 2016-01-26 DIAGNOSIS — H353221 Exudative age-related macular degeneration, left eye, with active choroidal neovascularization: Secondary | ICD-10-CM | POA: Diagnosis not present

## 2016-01-27 ENCOUNTER — Ambulatory Visit: Payer: PPO | Admitting: Internal Medicine

## 2016-02-02 ENCOUNTER — Encounter: Payer: Self-pay | Admitting: Internal Medicine

## 2016-02-02 ENCOUNTER — Ambulatory Visit (INDEPENDENT_AMBULATORY_CARE_PROVIDER_SITE_OTHER): Payer: PPO | Admitting: Internal Medicine

## 2016-02-02 DIAGNOSIS — N400 Enlarged prostate without lower urinary tract symptoms: Secondary | ICD-10-CM

## 2016-02-02 DIAGNOSIS — G8929 Other chronic pain: Secondary | ICD-10-CM

## 2016-02-02 DIAGNOSIS — G2581 Restless legs syndrome: Secondary | ICD-10-CM

## 2016-02-02 DIAGNOSIS — E538 Deficiency of other specified B group vitamins: Secondary | ICD-10-CM | POA: Diagnosis not present

## 2016-02-02 DIAGNOSIS — M79642 Pain in left hand: Secondary | ICD-10-CM

## 2016-02-02 DIAGNOSIS — M5440 Lumbago with sciatica, unspecified side: Secondary | ICD-10-CM

## 2016-02-02 DIAGNOSIS — F17210 Nicotine dependence, cigarettes, uncomplicated: Secondary | ICD-10-CM | POA: Insufficient documentation

## 2016-02-02 MED ORDER — HYDROCODONE-ACETAMINOPHEN 7.5-325 MG PO TABS: 1.0000 | ORAL_TABLET | Freq: Four times a day (QID) | ORAL | 0 refills | Status: DC | PRN

## 2016-02-02 NOTE — Assessment & Plan Note (Signed)
On Norco  Potential benefits of a long term opioids use as well as potential risks (i.e. addiction risk, apnea etc) and complications (i.e. Somnolence, constipation and others) were explained to the patient and were aknowledged.  

## 2016-02-02 NOTE — Assessment & Plan Note (Signed)
Proscar 

## 2016-02-02 NOTE — Assessment & Plan Note (Signed)
Screen CT

## 2016-02-02 NOTE — Assessment & Plan Note (Signed)
Requip  ?

## 2016-02-02 NOTE — Progress Notes (Signed)
Subjective:  Patient ID: Justin Pagan Su Grand., male    DOB: 14-Oct-1940  Age: 75 y.o. MRN: [id number]  CC: No chief complaint on file.   HPI Justin [name]. presents for chronic pain, BPH, restless legs f/u. C/o fam h/o lung ca  Outpatient Medications Prior to Visit  Medication Sig Dispense Refill  . aspirin 81 MG EC tablet Take 81 mg by mouth daily.      . Cholecalciferol 1000 UNITS tablet Take 1,000 Units by mouth daily.      . Cyanocobalamin (VITAMIN B-12) 1000 MCG SUBL DISSOLVE 2 TABLETS IN MOUTH EVERY DAY 60 tablet 8  . dorzolamide (TRUSOPT) 2 % ophthalmic solution Place 1 drop into both eyes 2 (two) times daily.    . finasteride (PROSCAR) 5 MG tablet TAKE 1 TABLET (5 MG TOTAL) BY MOUTH DAILY. 90 tablet 1  . HYDROcodone-acetaminophen (NORCO) 7.5-325 MG tablet Take 1 tablet by mouth 4 (four) times daily as needed for severe pain. Please fill on or after 12/29/15 120 tablet 0  . latanoprost (XALATAN) 0.005 % ophthalmic solution Place 1 drop into both eyes daily.    . meclizine (ANTIVERT) 12.5 MG tablet Take 1 tablet (12.5 mg total) by mouth 3 (three) times daily as needed for dizziness. 60 tablet 1  . Multiple Vitamin (MULTIVITAMIN) capsule Take 1 capsule by mouth daily.      . ondansetron (ZOFRAN) 4 MG tablet Take 1 tablet (4 mg total) by mouth every 8 (eight) hours as needed for nausea or vomiting. 20 tablet 0  . tamsulosin (FLOMAX) 0.4 MG CAPS capsule TAKE ONE CAPSULE EVERY DAY 90 capsule 2  . travoprost, benzalkonium, (TRAVATAN) 0.004 % ophthalmic solution 1 drop at bedtime.      . Desoximetasone (TOPICORT) 0.25 % ointment Apply topically 4 (four) times daily. Reported on 07/30/2015    . fluticasone (FLONASE) 50 MCG/ACT nasal spray Place 2 sprays into the nose daily. 16 g 6  . ipratropium (ATROVENT) 0.06 % nasal spray Place 2 sprays into the nose 3 (three) times daily. (Patient not taking: Reported on 02/02/2016) 15 mL 2  . loratadine (CLARITIN) 10 MG tablet Take 1 tablet (10  mg total) by mouth daily as needed. For allergies (Patient not taking: Reported on 02/02/2016) 100 tablet 3  . polyethylene glycol (GLYCOLAX/MIRALAX) powder Take 17 g by mouth daily as needed. For constipation     . rOPINIRole (REQUIP) 1 MG tablet Take 1-2 mg by mouth at bedtime as needed. Reported on 07/30/2015    . tadalafil (CIALIS) 20 MG tablet Take 10-20 mg by mouth as directed. Reported on 07/30/2015    . triamcinolone (KENALOG) 0.5 % cream Apply topically daily. In eucerin lotion 1:10     . tamsulosin (FLOMAX) 0.4 MG CAPS capsule Take 1 capsule (0.4 mg total) by mouth daily. (Patient not taking: Reported on 02/02/2016) 90 capsule 3   No facility-administered medications prior to visit.     ROS Review of Systems  Constitutional: Positive for fatigue. Negative for appetite change and unexpected weight change.  HENT: Negative for congestion, nosebleeds, sneezing, sore throat and trouble swallowing.   Eyes: Negative for itching and visual disturbance.  Respiratory: Negative for cough.   Cardiovascular: Negative for chest pain, palpitations and leg swelling.  Gastrointestinal: Negative for abdominal distention, blood in stool, diarrhea and nausea.  Genitourinary: Negative for frequency and hematuria.  Musculoskeletal: Positive for arthralgias. Negative for back pain, gait problem, joint swelling and neck pain.  Skin: Negative for rash.  Neurological: Negative for dizziness, tremors, speech difficulty and weakness.  Psychiatric/Behavioral: Negative for agitation, dysphoric mood and sleep disturbance. The patient is not nervous/anxious.     Objective:  BP 140/90   Pulse 60   Wt 174 lb (78.9 kg)   SpO2 98%   BMI 23.60 kg/m   BP Readings from Last 3 Encounters:  02/02/16 140/90  10/29/15 (!) 150/90  07/30/15 130/70    Wt Readings from Last 3 Encounters:  02/02/16 174 lb (78.9 kg)  10/29/15 175 lb (79.4 kg)  07/30/15 177 lb 12 oz (80.6 kg)    Physical Exam  Constitutional: He is  oriented to person, place, and time. He appears well-developed. No distress.  NAD  HENT:  Mouth/Throat: Oropharynx is clear and moist.  Eyes: Conjunctivae are normal. Pupils are equal, round, and reactive to light.  Neck: Normal range of motion. No JVD present. No thyromegaly present.  Cardiovascular: Normal rate, regular rhythm, normal heart sounds and intact distal pulses.  Exam reveals no gallop and no friction rub.   No murmur heard. Pulmonary/Chest: Effort normal and breath sounds normal. No respiratory distress. He has no wheezes. He has no rales. He exhibits no tenderness.  Abdominal: Soft. Bowel sounds are normal. He exhibits no distension and no mass. There is no tenderness. There is no rebound and no guarding.  Musculoskeletal: Normal range of motion. He exhibits tenderness. He exhibits no edema.  Lymphadenopathy:    He has no cervical adenopathy.  Neurological: He is alert and oriented to person, place, and time. He has normal reflexes. No cranial nerve deficit. He exhibits normal muscle tone. He displays a negative Romberg sign. Coordination and gait normal.  Skin: Skin is warm and dry. No rash noted.  Psychiatric: He has a normal mood and affect. His behavior is normal. Judgment and thought content normal.  shoulders, neck - tender  Lab Results  Component Value Date   WBC 6.8 07/28/2015   HGB 11.0 (L) 07/28/2015   HCT 33.5 (L) 07/28/2015   PLT 226.0 07/28/2015   GLUCOSE 100 (H) 07/28/2015   CHOL 131 07/28/2015   TRIG 96.0 07/28/2015   HDL 49.80 07/28/2015   LDLCALC 62 07/28/2015   ALT 9 07/28/2015   AST 15 07/28/2015   NA 139 07/28/2015   K 4.5 07/28/2015   CL 106 07/28/2015   CREATININE 1.07 07/28/2015   BUN 21 07/28/2015   CO2 28 07/28/2015   TSH 2.15 07/28/2015   PSA 0.47 07/28/2015   HGBA1C 5.6 07/28/2015    Dg Chest 2 View  Result Date: 03/06/2013 *RADIOLOGY REPORT* Clinical Data: Cough and history of tobacco use. CHEST - 2 VIEW Comparison: 12/19/2007  Findings: Stable probable mild chronic lung disease.  No evidence of edema, infiltrate, nodule or pleural fluid.  The heart size and mediastinal contours are within normal limits.  Bony thorax is unremarkable. IMPRESSION: Probable mild chronic lung disease.  No acute findings. Original Report Authenticated By: Aletta Edouard, M.D.    Assessment & Plan:   There are no diagnoses linked to this encounter. I am having Justin [name] maintain his aspirin, tadalafil, polyethylene glycol powder, multivitamin, rOPINIRole, Desoximetasone, travoprost (benzalkonium), triamcinolone cream, Cholecalciferol, loratadine, fluticasone, dorzolamide, latanoprost, ondansetron, meclizine, Vitamin B-12, ipratropium, finasteride, HYDROcodone-acetaminophen, and tamsulosin.  No orders of the defined types were placed in this encounter.    Follow-up: No Follow-up on file.  [name], MD

## 2016-02-02 NOTE — Assessment & Plan Note (Signed)
On B12 

## 2016-02-02 NOTE — Progress Notes (Signed)
Pre visit review using our clinic review tool, if applicable. No additional management support is needed unless otherwise documented below in the visit note. 

## 2016-02-03 ENCOUNTER — Encounter: Payer: Self-pay | Admitting: Internal Medicine

## 2016-02-07 ENCOUNTER — Encounter: Payer: Self-pay | Admitting: Internal Medicine

## 2016-02-14 ENCOUNTER — Other Ambulatory Visit: Payer: Self-pay | Admitting: Internal Medicine

## 2016-02-14 DIAGNOSIS — Z129 Encounter for screening for malignant neoplasm, site unspecified: Secondary | ICD-10-CM

## 2016-02-16 DIAGNOSIS — H353221 Exudative age-related macular degeneration, left eye, with active choroidal neovascularization: Secondary | ICD-10-CM | POA: Diagnosis not present

## 2016-02-17 ENCOUNTER — Encounter: Payer: Self-pay | Admitting: Internal Medicine

## 2016-02-17 ENCOUNTER — Telehealth: Payer: Self-pay | Admitting: Internal Medicine

## 2016-02-17 DIAGNOSIS — R059 Cough, unspecified: Secondary | ICD-10-CM

## 2016-02-17 DIAGNOSIS — R05 Cough: Secondary | ICD-10-CM

## 2016-02-17 NOTE — Telephone Encounter (Signed)
Per Judson Roch pt does not qualify for this program. Due to his quite date being past the 15 year mark.

## 2016-02-17 NOTE — Telephone Encounter (Signed)
Noted. Thx.

## 2016-02-23 DIAGNOSIS — L821 Other seborrheic keratosis: Secondary | ICD-10-CM | POA: Diagnosis not present

## 2016-02-23 DIAGNOSIS — D2261 Melanocytic nevi of right upper limb, including shoulder: Secondary | ICD-10-CM | POA: Diagnosis not present

## 2016-02-23 DIAGNOSIS — D692 Other nonthrombocytopenic purpura: Secondary | ICD-10-CM | POA: Diagnosis not present

## 2016-02-23 DIAGNOSIS — D225 Melanocytic nevi of trunk: Secondary | ICD-10-CM | POA: Diagnosis not present

## 2016-02-23 DIAGNOSIS — D1801 Hemangioma of skin and subcutaneous tissue: Secondary | ICD-10-CM | POA: Diagnosis not present

## 2016-02-23 DIAGNOSIS — L570 Actinic keratosis: Secondary | ICD-10-CM | POA: Diagnosis not present

## 2016-02-23 DIAGNOSIS — D2371 Other benign neoplasm of skin of right lower limb, including hip: Secondary | ICD-10-CM | POA: Diagnosis not present

## 2016-02-23 DIAGNOSIS — Z85828 Personal history of other malignant neoplasm of skin: Secondary | ICD-10-CM | POA: Diagnosis not present

## 2016-02-24 ENCOUNTER — Telehealth: Payer: Self-pay | Admitting: Acute Care

## 2016-02-24 NOTE — Telephone Encounter (Signed)
Please advise 

## 2016-02-24 NOTE — Telephone Encounter (Signed)
Referral was canceled on 02/16/16 for the lung cancer program due to pt's quit date being longer then 15 years ago. I sent notification to Dr. Alain Marion via fax on 02/16/16. Nothing further needed.

## 2016-02-24 NOTE — Telephone Encounter (Signed)
Pt called regarding him not qualifying for the CT lung cancer screening.  He's is wondering if maybe he could get a chest xray just to see what's going on in there for preventative measures.  Please advise

## 2016-02-24 NOTE — Telephone Encounter (Signed)
OK CXR °Thx °

## 2016-02-25 NOTE — Telephone Encounter (Signed)
Please call patient

## 2016-02-25 NOTE — Addendum Note (Signed)
Addended by: Cresenciano Lick on: 02/25/2016 11:27 AM   Modules accepted: Orders

## 2016-02-25 NOTE — Telephone Encounter (Signed)
CXR ordered. Left detailed mess informing pt.

## 2016-02-29 ENCOUNTER — Ambulatory Visit (INDEPENDENT_AMBULATORY_CARE_PROVIDER_SITE_OTHER)
Admission: RE | Admit: 2016-02-29 | Discharge: 2016-02-29 | Disposition: A | Discharge status: Home / Self Care | Payer: PPO | Source: Ambulatory Visit | Attending: Internal Medicine | Admitting: Internal Medicine

## 2016-02-29 DIAGNOSIS — R05 Cough: Secondary | ICD-10-CM

## 2016-02-29 DIAGNOSIS — R059 Cough, unspecified: Secondary | ICD-10-CM

## 2016-02-29 DIAGNOSIS — R079 Chest pain, unspecified: Secondary | ICD-10-CM | POA: Diagnosis not present

## 2016-03-22 DIAGNOSIS — H353221 Exudative age-related macular degeneration, left eye, with active choroidal neovascularization: Secondary | ICD-10-CM | POA: Diagnosis not present

## 2016-03-23 ENCOUNTER — Ambulatory Visit (INDEPENDENT_AMBULATORY_CARE_PROVIDER_SITE_OTHER): Payer: PPO

## 2016-03-23 DIAGNOSIS — Z23 Encounter for immunization: Secondary | ICD-10-CM | POA: Diagnosis not present

## 2016-04-11 ENCOUNTER — Other Ambulatory Visit: Payer: Self-pay | Admitting: Internal Medicine

## 2016-04-12 ENCOUNTER — Encounter: Payer: Self-pay | Admitting: Internal Medicine

## 2016-04-12 ENCOUNTER — Ambulatory Visit (INDEPENDENT_AMBULATORY_CARE_PROVIDER_SITE_OTHER): Payer: PPO | Admitting: Internal Medicine

## 2016-04-12 VITALS — BP 134/84 | HR 55 | Temp 97.6°F | Resp 16 | Wt 176.0 lb

## 2016-04-12 DIAGNOSIS — J209 Acute bronchitis, unspecified: Secondary | ICD-10-CM | POA: Diagnosis not present

## 2016-04-12 MED ORDER — DOXYCYCLINE HYCLATE 100 MG PO TABS: 100.0000 mg | ORAL_TABLET | Freq: Two times a day (BID) | ORAL | 0 refills | Status: DC

## 2016-04-12 NOTE — Assessment & Plan Note (Signed)
His symptoms are moderate in nature and consistent with bacterial infection We will prescribe doxycycline twice a day 10 days Discussed over-the-counter medications he can take for symptom relief Increase rest and fluids Call or return if there is no improvement or if his symptoms worsen

## 2016-04-12 NOTE — Progress Notes (Signed)
Subjective:    Patient ID: Justin Pagan Su Grand., male    DOB: [birth year], 75 y.o.   MRN: [id number]  HPI He is here for an acute visit. .  Cold symptoms: The symptoms started 4-5 days ago. He states he feels terrible. He has chest congestion and is having difficulty getting up the phlegm. When he is able to cough it up it is green in color. When the phlegm is stuck in his chest he has chest discomfort and shortness of breath. Once he is able to get the phlegm up he feels much better.  He states fatigue, mild nasal congestion, postnasal drip and dizziness.  He denies any wheezing, fevers, ear pain, sore throat, sinus pressure and headaches. He has not tried any medication for his symptoms.   Medications and allergies reviewed with patient and updated if appropriate.  Patient Active Problem List   Diagnosis Date Noted  . Smoking greater than 30 pack years 02/02/2016  . Vertigo 02/09/2015  . Left hand pain 01/14/2015  . Elevated TSH 04/09/2014  . Abdominal pain, lower 04/07/2014  . Well adult exam 02/28/2013  . Alcoholism in recovery ([place]) 02/28/2013  . Ingrowing toenail of left foot 08/29/2012  . Allergic rhinitis 10/10/2011  . Dupuytren's contracture of hand 07/05/2011  . Vitamin B12 deficiency 12/23/2010  . BPH (benign prostatic hyperplasia) 12/23/2010  . SINUSITIS, ACUTE 03/24/2010  . CHEST PAIN 03/01/2010  . LOW BACK PAIN 07/27/2009  . TOBACCO USE, QUIT 07/27/2009  . DIVERTICULOSIS, COLON 03/28/2009  . DIVERTICULITIS 03/26/2009  . ABDOMINAL PAIN, LOWER 03/26/2009  . RESTLESS LEG SYNDROME 10/28/2008  . SKIN RASH, ALLERGIC 10/01/2008  . ARM PAIN, LEFT 10/01/2008  . EDEMA 10/01/2008  . NEUROPATHY, OTHER INFLAMMATORY AND TOXIC 04/22/2008  . UTI 02/26/2008  . RECTAL FISSURE 02/24/2008  . FEVER UNSPECIFIED 02/24/2008  . Vitamin D deficiency 01/21/2008  . INSOMNIA, CHRONIC 12/23/2007  . Depression 12/23/2007  . Osteoarthritis 12/23/2007  . SYNCOPE 12/23/2007  .  DIZZINESS 12/23/2007  . CAROTID BRUIT, LEFT 12/23/2007  . FRACTURE, ARM, LEFT 12/23/2007    Current Outpatient Prescriptions on File Prior to Visit  Medication Sig Dispense Refill  . aspirin 81 MG EC tablet Take 81 mg by mouth daily.      . Cholecalciferol 1000 UNITS tablet Take 1,000 Units by mouth daily.      . Cyanocobalamin (VITAMIN B-12) 1000 MCG SUBL DISSOLVE 2 TABLETS IN MOUTH EVERY DAY 60 tablet 8  . dorzolamide (TRUSOPT) 2 % ophthalmic solution Place 1 drop into both eyes 2 (two) times daily.    . finasteride (PROSCAR) 5 MG tablet TAKE 1 TABLET (5 MG TOTAL) BY MOUTH DAILY. 90 tablet 3  . HYDROcodone-acetaminophen (NORCO) 7.5-325 MG tablet Take 1 tablet by mouth 4 (four) times daily as needed for severe pain. Please fill on or after 04/03/16 120 tablet 0  . latanoprost (XALATAN) 0.005 % ophthalmic solution Place 1 drop into both eyes daily.    . meclizine (ANTIVERT) 12.5 MG tablet Take 1 tablet (12.5 mg total) by mouth 3 (three) times daily as needed for dizziness. 60 tablet 1  . Multiple Vitamin (MULTIVITAMIN) capsule Take 1 capsule by mouth daily.      . ondansetron (ZOFRAN) 4 MG tablet Take 1 tablet (4 mg total) by mouth every 8 (eight) hours as needed for nausea or vomiting. 20 tablet 0  . tamsulosin (FLOMAX) 0.4 MG CAPS capsule TAKE ONE CAPSULE EVERY DAY 90 capsule 2   No current facility-administered medications  on file prior to visit.     Past Medical History:  Diagnosis Date  . Alcoholism ([place])    Dry 13 years  . Basal cell carcinoma    Right Chest  . Chronic insomnia    On ambien x 2 years  . Depression   . Diverticulosis of colon   . Dizzy spells   . Elbow fracture, left 2009   [name]  . Glaucoma   . History of TIAs   . Hyperkeratosis   . LBP (low back pain)   . Melanoma [facility]) 2009   x3 [name]  . Osteoarthritis   . Restless leg syndrome   . Tinnitus   . Tobacco abuse     Past Surgical History:  Procedure Laterality Date  . CATARACT EXTRACTION     . MELANOMA EXCISION  2009   x3  . RECONSTRUCTION MEDIAL COLLATERAL LIGAMENT ELBOW W/ TENDON GRAFT  2009   Left, [name]    Social History   Social History  . Marital status: Married    Spouse name: N/A  . Number of children: 2  . Years of education: N/A   Occupational History  . [place] Veteran - [employer] Retired  . Chiropodist   . Heritage manager    Social History Main Topics  . Smoking status: Former Smoker    Packs/day: 3.00    Years: 38.00    Types: Cigarettes    Quit date: 08/30/1990  . Smokeless tobacco: Not on file     Comment: states he quit 1986  . Alcohol use No     Comment: PREVIOUS - DRY 13 yrs  . Drug use: Unknown  . Sexual activity: Not Currently   Other Topics Concern  . Not on file   Social History Narrative  . No narrative on file    Family History  Problem Relation Age of Onset  . Cancer Mother     ?breast  . Cancer Father     Lung    Review of Systems  Constitutional: Negative for chills and fever.  HENT: Positive for congestion (mild) and postnasal drip. Negative for ear pain, sinus pressure and sore throat.   Respiratory: Positive for cough and shortness of breath (at night when laying down). Negative for wheezing.   Cardiovascular: Positive for chest pain (only when phlegm stuck in airway).  Gastrointestinal: Negative for abdominal pain, diarrhea and nausea.  Neurological: Positive for dizziness (mild). Negative for light-headedness and headaches.       Objective:   Vitals:   04/12/16 1603  BP: 134/84  Pulse: (!) 55  Resp: 16  Temp: 97.6 F (36.4 C)   Filed Weights   04/12/16 1603  Weight: 176 lb (79.8 kg)   Body mass index is 23.87 kg/m.   Physical Exam GENERAL APPEARANCE: Appears stated age, well appearing, NAD EYES: conjunctiva clear, no icterus HEENT: bilateral tympanic membranes and ear canals normal, oropharynx with no erythema, no thyromegaly, trachea midline, no cervical or supraclavicular  lymphadenopathy LUNGS: Clear to auscultation without wheeze or crackles, unlabored breathing, good air entry bilaterally HEART: Normal S1,S2 without murmurs EXTREMITIES: Without clubbing, cyanosis, or edema        Assessment & Plan:   See Problem List for Assessment and Plan of chronic medical problems.

## 2016-04-12 NOTE — Patient Instructions (Signed)

## 2016-04-12 NOTE — Progress Notes (Signed)
Pre visit review using our clinic review tool, if applicable. No additional management support is needed unless otherwise documented below in the visit note. 

## 2016-04-26 DIAGNOSIS — H353131 Nonexudative age-related macular degeneration, bilateral, early dry stage: Secondary | ICD-10-CM | POA: Diagnosis not present

## 2016-04-26 DIAGNOSIS — H353221 Exudative age-related macular degeneration, left eye, with active choroidal neovascularization: Secondary | ICD-10-CM | POA: Diagnosis not present

## 2016-05-04 ENCOUNTER — Ambulatory Visit: Payer: PPO | Admitting: Internal Medicine

## 2016-05-30 ENCOUNTER — Encounter: Payer: Self-pay | Admitting: Internal Medicine

## 2016-05-30 ENCOUNTER — Ambulatory Visit (INDEPENDENT_AMBULATORY_CARE_PROVIDER_SITE_OTHER): Payer: PPO | Admitting: Internal Medicine

## 2016-05-30 DIAGNOSIS — R3911 Hesitancy of micturition: Secondary | ICD-10-CM

## 2016-05-30 DIAGNOSIS — M5440 Lumbago with sciatica, unspecified side: Secondary | ICD-10-CM | POA: Diagnosis not present

## 2016-05-30 DIAGNOSIS — F329 Major depressive disorder, single episode, unspecified: Secondary | ICD-10-CM | POA: Diagnosis not present

## 2016-05-30 DIAGNOSIS — N401 Enlarged prostate with lower urinary tract symptoms: Secondary | ICD-10-CM

## 2016-05-30 DIAGNOSIS — E538 Deficiency of other specified B group vitamins: Secondary | ICD-10-CM

## 2016-05-30 DIAGNOSIS — F32A Depression, unspecified: Secondary | ICD-10-CM

## 2016-05-30 DIAGNOSIS — G8929 Other chronic pain: Secondary | ICD-10-CM

## 2016-05-30 MED ORDER — HYDROCODONE-ACETAMINOPHEN 7.5-325 MG PO TABS: 1.0000 | ORAL_TABLET | Freq: Four times a day (QID) | ORAL | 0 refills | Status: DC | PRN

## 2016-05-30 NOTE — Assessment & Plan Note (Signed)
Norco  Potential benefits of a long term opioids use as well as potential risks (i.e. addiction risk, apnea etc) and complications (i.e. Somnolence, constipation and others) were explained to the patient and were aknowledged. 

## 2016-05-30 NOTE — Assessment & Plan Note (Signed)
On Proscar 

## 2016-05-30 NOTE — Progress Notes (Signed)
Subjective:  Patient ID: Justin Pagan Su Grand., male    DOB: 04/26/1941  Age: 75 y.o. MRN: [id number]  CC: Follow-up (3-Mths: Vit B12 deficiency; BPH; RLS; Lower back pain, chronic)   HPI Justin Vaden [employer]. presents for chronic pain, BPH, B12 def f/u  Outpatient Medications Prior to Visit  Medication Sig Dispense Refill  . aspirin 81 MG EC tablet Take 81 mg by mouth daily.      . Cholecalciferol 1000 UNITS tablet Take 1,000 Units by mouth daily.      . Cyanocobalamin (VITAMIN B-12) 1000 MCG SUBL DISSOLVE 2 TABLETS IN MOUTH EVERY DAY 60 tablet 8  . dorzolamide (TRUSOPT) 2 % ophthalmic solution Place 1 drop into both eyes 2 (two) times daily.    . finasteride (PROSCAR) 5 MG tablet TAKE 1 TABLET (5 MG TOTAL) BY MOUTH DAILY. 90 tablet 3  . HYDROcodone-acetaminophen (NORCO) 7.5-325 MG tablet Take 1 tablet by mouth 4 (four) times daily as needed for severe pain. Please fill on or after 04/03/16 120 tablet 0  . latanoprost (XALATAN) 0.005 % ophthalmic solution Place 1 drop into both eyes daily.    . meclizine (ANTIVERT) 12.5 MG tablet Take 1 tablet (12.5 mg total) by mouth 3 (three) times daily as needed for dizziness. 60 tablet 1  . Multiple Vitamin (MULTIVITAMIN) capsule Take 1 capsule by mouth daily.      . ondansetron (ZOFRAN) 4 MG tablet Take 1 tablet (4 mg total) by mouth every 8 (eight) hours as needed for nausea or vomiting. 20 tablet 0  . tamsulosin (FLOMAX) 0.4 MG CAPS capsule TAKE ONE CAPSULE EVERY DAY 90 capsule 2  . doxycycline (VIBRA-TABS) 100 MG tablet Take 1 tablet (100 mg total) by mouth 2 (two) times daily. 20 tablet 0   No facility-administered medications prior to visit.     ROS Review of Systems  Constitutional: Negative for appetite change, fatigue and unexpected weight change.  HENT: Negative for congestion, nosebleeds, sneezing, sore throat and trouble swallowing.   Eyes: Negative for itching and visual disturbance.  Respiratory: Negative for cough.     Cardiovascular: Negative for chest pain, palpitations and leg swelling.  Gastrointestinal: Negative for abdominal distention, blood in stool, diarrhea and nausea.  Genitourinary: Negative for frequency and hematuria.  Musculoskeletal: Positive for arthralgias, back pain and gait problem. Negative for joint swelling and neck pain.  Skin: Negative for rash.  Neurological: Negative for dizziness, tremors, speech difficulty and weakness.  Psychiatric/Behavioral: Negative for agitation, dysphoric mood and sleep disturbance. The patient is not nervous/anxious.     Objective:  BP 132/78 (BP Location: Right Arm, Patient Position: Sitting, Cuff Size: Large)   Pulse (!) 51   Temp 97.4 F (36.3 C) (Oral)   Resp 16   Ht 6' (1.829 m)   Wt 176 lb 12 oz (80.2 kg)   SpO2 98%   BMI 23.97 kg/m   BP Readings from Last 3 Encounters:  05/30/16 132/78  04/12/16 134/84  02/02/16 140/90    Wt Readings from Last 3 Encounters:  05/30/16 176 lb 12 oz (80.2 kg)  04/12/16 176 lb (79.8 kg)  02/02/16 174 lb (78.9 kg)    Physical Exam  Constitutional: He is oriented to person, place, and time. He appears well-developed. No distress.  NAD  HENT:  Mouth/Throat: Oropharynx is clear and moist.  Eyes: Conjunctivae are normal. Pupils are equal, round, and reactive to light.  Neck: Normal range of motion. No JVD present. No thyromegaly present.  Cardiovascular:  Normal rate, regular rhythm, normal heart sounds and intact distal pulses.  Exam reveals no gallop and no friction rub.   No murmur heard. Pulmonary/Chest: Effort normal and breath sounds normal. No respiratory distress. He has no wheezes. He has no rales. He exhibits no tenderness.  Abdominal: Soft. Bowel sounds are normal. He exhibits no distension and no mass. There is no tenderness. There is no rebound and no guarding.  Musculoskeletal: Normal range of motion. He exhibits tenderness. He exhibits no edema.  Lymphadenopathy:    He has no cervical  adenopathy.  Neurological: He is alert and oriented to person, place, and time. He has normal reflexes. No cranial nerve deficit. He exhibits normal muscle tone. He displays a negative Romberg sign. Coordination and gait normal.  Skin: Skin is warm and dry. No rash noted.  Psychiatric: He has a normal mood and affect. His behavior is normal. Judgment and thought content normal.  L hand deformed  Lab Results  Component Value Date   WBC 6.8 07/28/2015   HGB 11.0 (L) 07/28/2015   HCT 33.5 (L) 07/28/2015   PLT 226.0 07/28/2015   GLUCOSE 100 (H) 07/28/2015   CHOL 131 07/28/2015   TRIG 96.0 07/28/2015   HDL 49.80 07/28/2015   LDLCALC 62 07/28/2015   ALT 9 07/28/2015   AST 15 07/28/2015   NA 139 07/28/2015   K 4.5 07/28/2015   CL 106 07/28/2015   CREATININE 1.07 07/28/2015   BUN 21 07/28/2015   CO2 28 07/28/2015   TSH 2.15 07/28/2015   PSA 0.47 07/28/2015   HGBA1C 5.6 07/28/2015    Dg Chest 2 View  Result Date: 02/29/2016 CLINICAL DATA:  Chest pain. EXAM: CHEST  2 VIEW COMPARISON:  09/04/ 2014. FINDINGS: Mediastinum hilar structures are normal. No focal infiltrate. Low lung volumes. Heart size normal. No pleural effusion pneumothorax. Degenerative changes osteopenia thoracic spine. IMPRESSION: No acute cardiopulmonary disease. Electronically Signed   By: Marcello Moores  Register   On: 02/29/2016 12:05    Assessment & Plan:   There are no diagnoses linked to this encounter. I have discontinued Justin [name]'s doxycycline. I am also having him maintain his aspirin, multivitamin, Cholecalciferol, dorzolamide, latanoprost, ondansetron, meclizine, Vitamin B-12, tamsulosin, HYDROcodone-acetaminophen, and finasteride.  No orders of the defined types were placed in this encounter.    Follow-up: No Follow-up on file.  [name], MD

## 2016-05-30 NOTE — Progress Notes (Signed)
Pre visit review using our clinic review tool, if applicable. No additional management support is needed unless otherwise documented below in the visit note/SLS  

## 2016-05-30 NOTE — Assessment & Plan Note (Signed)
Doing fair 

## 2016-05-30 NOTE — Assessment & Plan Note (Signed)
On B12 

## 2016-05-31 DIAGNOSIS — H353221 Exudative age-related macular degeneration, left eye, with active choroidal neovascularization: Secondary | ICD-10-CM | POA: Diagnosis not present

## 2016-07-03 HISTORY — PX: CATARACT EXTRACTION: SUR2

## 2016-07-05 DIAGNOSIS — H353221 Exudative age-related macular degeneration, left eye, with active choroidal neovascularization: Secondary | ICD-10-CM | POA: Diagnosis not present

## 2016-07-11 ENCOUNTER — Encounter: Payer: Self-pay | Admitting: Internal Medicine

## 2016-07-11 DIAGNOSIS — D485 Neoplasm of uncertain behavior of skin: Secondary | ICD-10-CM | POA: Diagnosis not present

## 2016-07-11 DIAGNOSIS — C44329 Squamous cell carcinoma of skin of other parts of face: Secondary | ICD-10-CM | POA: Diagnosis not present

## 2016-07-11 DIAGNOSIS — Z85828 Personal history of other malignant neoplasm of skin: Secondary | ICD-10-CM | POA: Diagnosis not present

## 2016-08-01 DIAGNOSIS — Z85828 Personal history of other malignant neoplasm of skin: Secondary | ICD-10-CM | POA: Diagnosis not present

## 2016-08-01 DIAGNOSIS — C44329 Squamous cell carcinoma of skin of other parts of face: Secondary | ICD-10-CM | POA: Diagnosis not present

## 2016-08-08 DIAGNOSIS — Z4802 Encounter for removal of sutures: Secondary | ICD-10-CM | POA: Diagnosis not present

## 2016-08-09 DIAGNOSIS — H353221 Exudative age-related macular degeneration, left eye, with active choroidal neovascularization: Secondary | ICD-10-CM | POA: Diagnosis not present

## 2016-08-14 ENCOUNTER — Other Ambulatory Visit: Payer: Self-pay | Admitting: Internal Medicine

## 2016-08-16 DIAGNOSIS — H402222 Chronic angle-closure glaucoma, left eye, moderate stage: Secondary | ICD-10-CM | POA: Diagnosis not present

## 2016-08-16 DIAGNOSIS — H402211 Chronic angle-closure glaucoma, right eye, mild stage: Secondary | ICD-10-CM | POA: Diagnosis not present

## 2016-08-23 DIAGNOSIS — I788 Other diseases of capillaries: Secondary | ICD-10-CM | POA: Diagnosis not present

## 2016-08-23 DIAGNOSIS — L570 Actinic keratosis: Secondary | ICD-10-CM | POA: Diagnosis not present

## 2016-08-23 DIAGNOSIS — Z85828 Personal history of other malignant neoplasm of skin: Secondary | ICD-10-CM | POA: Diagnosis not present

## 2016-08-23 DIAGNOSIS — L821 Other seborrheic keratosis: Secondary | ICD-10-CM | POA: Diagnosis not present

## 2016-08-23 DIAGNOSIS — D2371 Other benign neoplasm of skin of right lower limb, including hip: Secondary | ICD-10-CM | POA: Diagnosis not present

## 2016-08-23 DIAGNOSIS — D225 Melanocytic nevi of trunk: Secondary | ICD-10-CM | POA: Diagnosis not present

## 2016-08-23 DIAGNOSIS — D692 Other nonthrombocytopenic purpura: Secondary | ICD-10-CM | POA: Diagnosis not present

## 2016-08-23 DIAGNOSIS — D2271 Melanocytic nevi of right lower limb, including hip: Secondary | ICD-10-CM | POA: Diagnosis not present

## 2016-08-30 ENCOUNTER — Other Ambulatory Visit (INDEPENDENT_AMBULATORY_CARE_PROVIDER_SITE_OTHER): Payer: PPO

## 2016-08-30 ENCOUNTER — Encounter: Payer: Self-pay | Admitting: Internal Medicine

## 2016-08-30 ENCOUNTER — Ambulatory Visit (INDEPENDENT_AMBULATORY_CARE_PROVIDER_SITE_OTHER): Payer: PPO | Admitting: Internal Medicine

## 2016-08-30 VITALS — BP 144/82 | HR 54 | Temp 97.9°F | Wt 174.0 lb

## 2016-08-30 DIAGNOSIS — E538 Deficiency of other specified B group vitamins: Secondary | ICD-10-CM

## 2016-08-30 DIAGNOSIS — E559 Vitamin D deficiency, unspecified: Secondary | ICD-10-CM | POA: Diagnosis not present

## 2016-08-30 DIAGNOSIS — Z0000 Encounter for general adult medical examination without abnormal findings: Secondary | ICD-10-CM

## 2016-08-30 DIAGNOSIS — E785 Hyperlipidemia, unspecified: Secondary | ICD-10-CM

## 2016-08-30 DIAGNOSIS — Z79891 Long term (current) use of opiate analgesic: Secondary | ICD-10-CM | POA: Diagnosis not present

## 2016-08-30 LAB — LIPID PANEL
Cholesterol: 157 mg/dL (ref 0–200)
HDL: 64.8 mg/dL (ref >39.00)
LDL Cholesterol: 80 mg/dL (ref 0–99)
NonHDL: 91.8
Total CHOL/HDL Ratio: 2
Triglycerides: 58 mg/dL (ref 0.0–149.0)
VLDL: 11.6 mg/dL (ref 0.0–40.0)

## 2016-08-30 LAB — URINALYSIS
Bilirubin Urine: NEGATIVE
Hgb urine dipstick: NEGATIVE
Ketones, ur: NEGATIVE
Leukocytes, UA: NEGATIVE
Nitrite: NEGATIVE
Specific Gravity, Urine: 1.01 (ref 1.000–1.030)
Total Protein, Urine: NEGATIVE
Urine Glucose: NEGATIVE
Urobilinogen, UA: 0.2 (ref 0.0–1.0)
pH: 5.5 (ref 5.0–8.0)

## 2016-08-30 LAB — CBC WITH DIFFERENTIAL/PLATELET
Basophils Absolute: 0 10*3/uL (ref 0.0–0.1)
Basophils Relative: 0.5 % (ref 0.0–3.0)
Eosinophils Absolute: 0.1 10*3/uL (ref 0.0–0.7)
Eosinophils Relative: 1.5 % (ref 0.0–5.0)
HCT: 36.5 % — ABNORMAL LOW (ref 39.0–52.0)
Hemoglobin: 12.3 g/dL — ABNORMAL LOW (ref 13.0–17.0)
Lymphocytes Relative: 23.8 % (ref 12.0–46.0)
Lymphs Abs: 1.6 10*3/uL (ref 0.7–4.0)
MCHC: 33.7 g/dL (ref 30.0–36.0)
MCV: 87.9 fl (ref 78.0–100.0)
Monocytes Absolute: 0.4 10*3/uL (ref 0.1–1.0)
Monocytes Relative: 6 % (ref 3.0–12.0)
Neutro Abs: 4.6 10*3/uL (ref 1.4–7.7)
Neutrophils Relative %: 68.2 % (ref 43.0–77.0)
Platelets: 236 10*3/uL (ref 150.0–400.0)
RBC: 4.16 Mil/uL — ABNORMAL LOW (ref 4.22–5.81)
RDW: 13.5 % (ref 11.5–15.5)
WBC: 6.8 10*3/uL (ref 4.0–10.5)

## 2016-08-30 LAB — BASIC METABOLIC PANEL
BUN: 18 mg/dL (ref 6–23)
CO2: 27 mEq/L (ref 19–32)
Calcium: 9.7 mg/dL (ref 8.4–10.5)
Chloride: 106 mEq/L (ref 96–112)
Creatinine, Ser: 0.99 mg/dL (ref 0.40–1.50)
GFR: 77.85 mL/min (ref >60.00)
Glucose, Bld: 97 mg/dL (ref 70–99)
Potassium: 4.2 mEq/L (ref 3.5–5.1)
Sodium: 141 mEq/L (ref 135–145)

## 2016-08-30 LAB — PSA: PSA: 0.47 ng/mL (ref 0.10–4.00)

## 2016-08-30 LAB — VITAMIN B12: Vitamin B-12: >1500 pg/mL — ABNORMAL HIGH (ref 211–911)

## 2016-08-30 LAB — HEPATIC FUNCTION PANEL
ALT: 10 U/L (ref 0–53)
AST: 16 U/L (ref 0–37)
Albumin: 4.5 g/dL (ref 3.5–5.2)
Alkaline Phosphatase: 57 U/L (ref 39–117)
Bilirubin, Direct: 0.2 mg/dL (ref 0.0–0.3)
Total Bilirubin: 0.8 mg/dL (ref 0.2–1.2)
Total Protein: 7.3 g/dL (ref 6.0–8.3)

## 2016-08-30 LAB — TSH: TSH: 4.2 u[IU]/mL (ref 0.35–4.50)

## 2016-08-30 MED ORDER — HYDROCODONE-ACETAMINOPHEN 7.5-325 MG PO TABS: 1.0000 | ORAL_TABLET | Freq: Four times a day (QID) | ORAL | 0 refills | Status: DC | PRN

## 2016-08-30 NOTE — Assessment & Plan Note (Addendum)
Here for medicare wellness/physical  Diet: heart healthy  Physical activity: not sedentary  Depression/mood screen: situational stress  Hearing: intact to whispered voice  Visual acuity: grossly normal w/glasses, performs annual eye exam  ADLs: capable  Fall risk: low  Home safety: good  Cognitive evaluation: intact to orientation, naming, recall and repetition  EOL planning: adv directives, full code/ I agree  I have personally reviewed and have noted  1. The patient's medical, surgical and social history  2. Their use of alcohol, tobacco or illicit drugs  3. Their current medications and supplements  4. The patient's functional ability including ADL's, fall risks, home safety risks and hearing or visual impairment.  5. Diet and physical activities  6. Evidence for depression or mood disorders 7. The roster of all physicians providing medical care to patient - is listed in the Snapshot section of the chart and reviewed today.    Today patient counseled on age appropriate routine health concerns for screening and prevention, each reviewed and up to date or declined. Immunizations reviewed and up to date or declined. Labs ordered and reviewed. Risk factors for depression reviewed and negative. Hearing function and visual acuity are intact. ADLs screened and addressed as needed. Functional ability and level of safety reviewed and appropriate. Education, counseling and referrals performed based on assessed risks today. Patient provided with a copy of personalized plan for preventive services.

## 2016-08-30 NOTE — Progress Notes (Signed)
Subjective:  Patient ID: Justin Pagan Su Grand., male    DOB: Jun 28, 1941  Age: 76 y.o. MRN: [id number]  CC: No chief complaint on file.   HPI Justin Debar [employer]. presents for a well exam C/o stress w/wife's illness F/u chronic pain  Outpatient Medications Prior to Visit  Medication Sig Dispense Refill  . aspirin 81 MG EC tablet Take 81 mg by mouth daily.      . Cholecalciferol 1000 UNITS tablet Take 1,000 Units by mouth daily.      . Cyanocobalamin (VITAMIN B-12) 1000 MCG SUBL DISSOLVE 2 TABLETS IN MOUTH EVERY DAY 60 tablet 8  . finasteride (PROSCAR) 5 MG tablet TAKE 1 TABLET (5 MG TOTAL) BY MOUTH DAILY. 90 tablet 3  . HYDROcodone-acetaminophen (NORCO) 7.5-325 MG tablet Take 1 tablet by mouth 4 (four) times daily as needed for severe pain. Please fill on or after 08/04/16 120 tablet 0  . latanoprost (XALATAN) 0.005 % ophthalmic solution Place 1 drop into both eyes daily.    . meclizine (ANTIVERT) 12.5 MG tablet Take 1 tablet (12.5 mg total) by mouth 3 (three) times daily as needed for dizziness. 60 tablet 1  . Multiple Vitamin (MULTIVITAMIN) capsule Take 1 capsule by mouth daily.      . ondansetron (ZOFRAN) 4 MG tablet Take 1 tablet (4 mg total) by mouth every 8 (eight) hours as needed for nausea or vomiting. 20 tablet 0  . tamsulosin (FLOMAX) 0.4 MG CAPS capsule TAKE ONE CAPSULE EVERY DAY 90 capsule 1  . dorzolamide (TRUSOPT) 2 % ophthalmic solution Place 1 drop into both eyes 2 (two) times daily.     No facility-administered medications prior to visit.     ROS Review of Systems  Constitutional: Negative for appetite change, fatigue and unexpected weight change.  HENT: Negative for congestion, nosebleeds, sneezing, sore throat and trouble swallowing.   Eyes: Negative for itching and visual disturbance.  Respiratory: Negative for cough.   Cardiovascular: Negative for chest pain, palpitations and leg swelling.  Gastrointestinal: Negative for abdominal distention, blood in stool,  diarrhea and nausea.  Genitourinary: Negative for frequency and hematuria.  Musculoskeletal: Positive for arthralgias and back pain. Negative for gait problem, joint swelling and neck pain.  Skin: Negative for rash.  Neurological: Negative for dizziness, tremors, speech difficulty and weakness.  Psychiatric/Behavioral: Negative for agitation, dysphoric mood, sleep disturbance and suicidal ideas. The patient is not nervous/anxious.     Objective:  BP (!) 144/82   Pulse (!) 54   Temp 97.9 F (36.6 C)   Wt 174 lb (78.9 kg)   SpO2 98%   BMI 23.60 kg/m   BP Readings from Last 3 Encounters:  08/30/16 (!) 144/82  05/30/16 132/78  04/12/16 134/84    Wt Readings from Last 3 Encounters:  08/30/16 174 lb (78.9 kg)  05/30/16 176 lb 12 oz (80.2 kg)  04/12/16 176 lb (79.8 kg)    Physical Exam  Constitutional: He is oriented to person, place, and time. He appears well-developed. No distress.  NAD  HENT:  Mouth/Throat: Oropharynx is clear and moist.  Eyes: Conjunctivae are normal. Pupils are equal, round, and reactive to light.  Neck: Normal range of motion. No JVD present. No thyromegaly present.  Cardiovascular: Normal rate, regular rhythm, normal heart sounds and intact distal pulses.  Exam reveals no gallop and no friction rub.   No murmur heard. Pulmonary/Chest: Effort normal and breath sounds normal. No respiratory distress. He has no wheezes. He has no rales. He  exhibits no tenderness.  Abdominal: Soft. Bowel sounds are normal. He exhibits no distension and no mass. There is no tenderness. There is no rebound and no guarding.  Musculoskeletal: Normal range of motion. He exhibits tenderness. He exhibits no edema.  Lymphadenopathy:    He has no cervical adenopathy.  Neurological: He is alert and oriented to person, place, and time. He has normal reflexes. No cranial nerve deficit. He exhibits normal muscle tone. He displays a negative Romberg sign. Coordination and gait normal.    Skin: Skin is warm and dry. No rash noted.  Psychiatric: He has a normal mood and affect. His behavior is normal. Judgment and thought content normal.  shoulders tender L hand deformity  Lab Results  Component Value Date   WBC 6.8 07/28/2015   HGB 11.0 (L) 07/28/2015   HCT 33.5 (L) 07/28/2015   PLT 226.0 07/28/2015   GLUCOSE 100 (H) 07/28/2015   CHOL 131 07/28/2015   TRIG 96.0 07/28/2015   HDL 49.80 07/28/2015   LDLCALC 62 07/28/2015   ALT 9 07/28/2015   AST 15 07/28/2015   NA 139 07/28/2015   K 4.5 07/28/2015   CL 106 07/28/2015   CREATININE 1.07 07/28/2015   BUN 21 07/28/2015   CO2 28 07/28/2015   TSH 2.15 07/28/2015   PSA 0.47 07/28/2015   HGBA1C 5.6 07/28/2015    Dg Chest 2 View  Result Date: 02/29/2016 CLINICAL DATA:  Chest pain. EXAM: CHEST  2 VIEW COMPARISON:  09/04/ 2014. FINDINGS: Mediastinum hilar structures are normal. No focal infiltrate. Low lung volumes. Heart size normal. No pleural effusion pneumothorax. Degenerative changes osteopenia thoracic spine. IMPRESSION: No acute cardiopulmonary disease. Electronically Signed   By: Marcello Moores  Register   On: 02/29/2016 12:05    Assessment & Plan:   There are no diagnoses linked to this encounter. I am having Justin [name] maintain his aspirin, multivitamin, Cholecalciferol, dorzolamide, latanoprost, ondansetron, meclizine, Vitamin B-12, finasteride, HYDROcodone-acetaminophen, tamsulosin, and brinzolamide.  Meds ordered this encounter  Medications  . brinzolamide (AZOPT) 1 % ophthalmic suspension    Sig: 1 drop 3 (three) times daily.     Follow-up: No Follow-up on file.  [name], MD

## 2016-08-30 NOTE — Assessment & Plan Note (Signed)
On Vit D 

## 2016-08-30 NOTE — Assessment & Plan Note (Signed)
On B12 

## 2016-08-30 NOTE — Progress Notes (Signed)
Pre visit review using our clinic review tool, if applicable. No additional management support is needed unless otherwise documented below in the visit note. 

## 2016-09-08 NOTE — Telephone Encounter (Signed)
This encounter was created in error - please disregard.

## 2016-09-20 DIAGNOSIS — H353131 Nonexudative age-related macular degeneration, bilateral, early dry stage: Secondary | ICD-10-CM | POA: Diagnosis not present

## 2016-09-20 DIAGNOSIS — H353221 Exudative age-related macular degeneration, left eye, with active choroidal neovascularization: Secondary | ICD-10-CM | POA: Diagnosis not present

## 2016-09-20 DIAGNOSIS — H2512 Age-related nuclear cataract, left eye: Secondary | ICD-10-CM | POA: Diagnosis not present

## 2016-09-20 DIAGNOSIS — H3562 Retinal hemorrhage, left eye: Secondary | ICD-10-CM | POA: Diagnosis not present

## 2016-09-22 ENCOUNTER — Telehealth: Payer: Self-pay | Admitting: Internal Medicine

## 2016-09-22 NOTE — Telephone Encounter (Signed)
The pts sister called to check on some results for him. He got a call stating that his results were fine but he wanted to check to see which ones were done and the results for them. One in particular was the test done for narcotic users? Please advise. Thanks [employer]

## 2016-09-27 NOTE — Telephone Encounter (Signed)
Dr Alain Marion, I think this form was given to you, please advise, I will call patient back

## 2016-09-30 NOTE — Telephone Encounter (Signed)
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

## 2016-10-02 NOTE — Telephone Encounter (Signed)
Advised patient of dr plotnikovs note

## 2016-10-06 ENCOUNTER — Telehealth: Payer: Self-pay | Admitting: Internal Medicine

## 2016-10-06 NOTE — Telephone Encounter (Signed)
Patient called in and states he is having restless legs at night. It is causing him to be unable to sleep. He states years ago he use to take a medication for this. He does not know what it was called. But he would like to know if he could get this again. Please advise if patient needs an appointment. Thank you.

## 2016-10-26 ENCOUNTER — Telehealth: Payer: Self-pay | Admitting: Internal Medicine

## 2016-10-26 NOTE — Telephone Encounter (Signed)
Next OV Thx

## 2016-10-26 NOTE — Telephone Encounter (Signed)
Patient states he took a drug test a month ago for Dr. Camila Li and states Dr. Camila Li wanted him to come back in a month to do another.  Patient is confused as to if Dr. Camila Li wants on OV or if he needs to just come in any time?

## 2016-10-26 NOTE — Telephone Encounter (Signed)
Please advise.   Patient has appt next month do you want him to wait till then or do you want him to come in right away?

## 2016-10-27 NOTE — Telephone Encounter (Signed)
Pt.notified

## 2016-11-01 DIAGNOSIS — H353131 Nonexudative age-related macular degeneration, bilateral, early dry stage: Secondary | ICD-10-CM | POA: Diagnosis not present

## 2016-11-01 DIAGNOSIS — H353221 Exudative age-related macular degeneration, left eye, with active choroidal neovascularization: Secondary | ICD-10-CM | POA: Diagnosis not present

## 2016-11-13 NOTE — Telephone Encounter (Signed)
Try Benadryl 25 mg a dt night prn Thx

## 2016-11-13 NOTE — Telephone Encounter (Signed)
Patient coming in on 11/29/16. Should we wait until then of send in a medication?

## 2016-11-13 NOTE — Telephone Encounter (Signed)
don't know if this was ever taken care of.

## 2016-11-14 NOTE — Telephone Encounter (Signed)
Pt.notified

## 2016-11-29 ENCOUNTER — Ambulatory Visit (INDEPENDENT_AMBULATORY_CARE_PROVIDER_SITE_OTHER): Payer: PPO | Admitting: Internal Medicine

## 2016-11-29 ENCOUNTER — Encounter: Payer: Self-pay | Admitting: Internal Medicine

## 2016-11-29 DIAGNOSIS — M79602 Pain in left arm: Secondary | ICD-10-CM | POA: Diagnosis not present

## 2016-11-29 DIAGNOSIS — E538 Deficiency of other specified B group vitamins: Secondary | ICD-10-CM | POA: Diagnosis not present

## 2016-11-29 DIAGNOSIS — G629 Polyneuropathy, unspecified: Secondary | ICD-10-CM | POA: Diagnosis not present

## 2016-11-29 DIAGNOSIS — N401 Enlarged prostate with lower urinary tract symptoms: Secondary | ICD-10-CM | POA: Diagnosis not present

## 2016-11-29 DIAGNOSIS — I10 Essential (primary) hypertension: Secondary | ICD-10-CM

## 2016-11-29 DIAGNOSIS — M5440 Lumbago with sciatica, unspecified side: Secondary | ICD-10-CM | POA: Diagnosis not present

## 2016-11-29 MED ORDER — HYDROCODONE-ACETAMINOPHEN 7.5-325 MG PO TABS: 1.0000 | ORAL_TABLET | Freq: Four times a day (QID) | ORAL | 0 refills | Status: DC | PRN

## 2016-11-29 MED ORDER — TERAZOSIN HCL 2 MG PO CAPS: 2.0000 mg | ORAL_CAPSULE | Freq: Every day | ORAL | 11 refills | Status: DC

## 2016-11-29 MED ORDER — GABAPENTIN 300 MG PO CAPS: 300.0000 mg | ORAL_CAPSULE | Freq: Every day | ORAL | 3 refills | Status: DC

## 2016-11-29 NOTE — Progress Notes (Signed)
Subjective:  Patient ID: Justin [name]., male    DOB: [birth year]  Age: 76 y.o. MRN: [id number]  CC: No chief complaint on file.   HPI Justin [name] [place]. presents for chronic pain, HTN, BPH, B12 def f/u. Legs hurt a lot at night - calves, restless legs sensation  Outpatient Medications Prior to Visit  Medication Sig Dispense Refill  . aspirin 81 MG EC tablet Take 81 mg by mouth daily.      . brinzolamide (AZOPT) 1 % ophthalmic suspension 1 drop 3 (three) times daily.    . Cholecalciferol 1000 UNITS tablet Take 1,000 Units by mouth daily.      . Cyanocobalamin (VITAMIN B-12) 1000 MCG SUBL DISSOLVE 2 TABLETS IN MOUTH EVERY DAY 60 tablet 8  . finasteride (PROSCAR) 5 MG tablet TAKE 1 TABLET (5 MG TOTAL) BY MOUTH DAILY. 90 tablet 3  . HYDROcodone-acetaminophen (NORCO) 7.5-325 MG tablet Take 1 tablet by mouth 4 (four) times daily as needed for severe pain. Please fill on or after 11/01/16 120 tablet 0  . latanoprost (XALATAN) 0.005 % ophthalmic solution Place 1 drop into both eyes daily.    . meclizine (ANTIVERT) 12.5 MG tablet Take 1 tablet (12.5 mg total) by mouth 3 (three) times daily as needed for dizziness. 60 tablet 1  . Multiple Vitamin (MULTIVITAMIN) capsule Take 1 capsule by mouth daily.      . ondansetron (ZOFRAN) 4 MG tablet Take 1 tablet (4 mg total) by mouth every 8 (eight) hours as needed for nausea or vomiting. 20 tablet 0  . tamsulosin (FLOMAX) 0.4 MG CAPS capsule TAKE ONE CAPSULE EVERY DAY 90 capsule 1  . dorzolamide (TRUSOPT) 2 % ophthalmic solution Place 1 drop into both eyes 2 (two) times daily.     No facility-administered medications prior to visit.     ROS Review of Systems  Constitutional: Positive for fatigue. Negative for appetite change and unexpected weight change.  HENT: Negative for congestion, nosebleeds, sneezing, sore throat and trouble swallowing.   Eyes: Negative for itching and visual disturbance.  Respiratory: Negative for cough.     Cardiovascular: Negative for chest pain, palpitations and leg swelling.  Gastrointestinal: Negative for abdominal distention, blood in stool, diarrhea and nausea.  Genitourinary: Negative for frequency and hematuria.  Musculoskeletal: Positive for arthralgias and back pain. Negative for gait problem, joint swelling and neck pain.  Skin: Negative for rash.  Neurological: Negative for dizziness, tremors, speech difficulty and weakness.  Psychiatric/Behavioral: Negative for agitation, dysphoric mood and sleep disturbance. The patient is not nervous/anxious.     Objective:  BP (!) 162/88 (BP Location: Left Arm, Patient Position: Sitting, Cuff Size: Normal)   Pulse (!) 57   Temp 97.7 F (36.5 C) (Oral)   Ht 6' (1.829 m)   Wt 181 lb (82.1 kg)   SpO2 99%   BMI 24.55 kg/m   BP Readings from Last 3 Encounters:  11/29/16 (!) 162/88  08/30/16 (!) 144/82  05/30/16 132/78    Wt Readings from Last 3 Encounters:  11/29/16 181 lb (82.1 kg)  08/30/16 174 lb (78.9 kg)  05/30/16 176 lb 12 oz (80.2 kg)    Physical Exam  Constitutional: He is oriented to person, place, and time. He appears well-developed. No distress.  NAD  HENT:  Mouth/Throat: Oropharynx is clear and moist.  Eyes: Conjunctivae are normal. Pupils are equal, round, and reactive to light.  Neck: Normal range of motion. No JVD present. No thyromegaly present.  Cardiovascular: Normal  rate, regular rhythm, normal heart sounds and intact distal pulses.  Exam reveals no gallop and no friction rub.   No murmur heard. Pulmonary/Chest: Effort normal and breath sounds normal. No respiratory distress. He has no wheezes. He has no rales. He exhibits no tenderness.  Abdominal: Soft. Bowel sounds are normal. He exhibits no distension and no mass. There is no tenderness. There is no rebound and no guarding.  Musculoskeletal: Normal range of motion. He exhibits tenderness. He exhibits no edema.  Lymphadenopathy:    He has no cervical  adenopathy.  Neurological: He is alert and oriented to person, place, and time. He has normal reflexes. No cranial nerve deficit. He exhibits normal muscle tone. He displays a negative Romberg sign. Coordination and gait normal.  Skin: Skin is warm and dry. No rash noted.  Psychiatric: He has a normal mood and affect. His behavior is normal. Judgment and thought content normal.    Lab Results  Component Value Date   WBC 6.8 08/30/2016   HGB 12.3 (L) 08/30/2016   HCT 36.5 (L) 08/30/2016   PLT 236.0 08/30/2016   GLUCOSE 97 08/30/2016   CHOL 157 08/30/2016   TRIG 58.0 08/30/2016   HDL 64.80 08/30/2016   LDLCALC 80 08/30/2016   ALT 10 08/30/2016   AST 16 08/30/2016   NA 141 08/30/2016   K 4.2 08/30/2016   CL 106 08/30/2016   CREATININE 0.99 08/30/2016   BUN 18 08/30/2016   CO2 27 08/30/2016   TSH 4.20 08/30/2016   PSA 0.47 08/30/2016   HGBA1C 5.6 07/28/2015    Dg Chest 2 View  Result Date: 02/29/2016 CLINICAL DATA:  Chest pain. EXAM: CHEST  2 VIEW COMPARISON:  09/04/ 2014. FINDINGS: Mediastinum hilar structures are normal. No focal infiltrate. Low lung volumes. Heart size normal. No pleural effusion pneumothorax. Degenerative changes osteopenia thoracic spine. IMPRESSION: No acute cardiopulmonary disease. Electronically Signed   By: Marcello Moores  Register   On: 02/29/2016 12:05    Assessment & Plan:   There are no diagnoses linked to this encounter. I have discontinued Justin [name]'s dorzolamide. I am also having him maintain his aspirin, multivitamin, Cholecalciferol, latanoprost, ondansetron, meclizine, Vitamin B-12, finasteride, tamsulosin, brinzolamide, and HYDROcodone-acetaminophen.  No orders of the defined types were placed in this encounter.    Follow-up: No Follow-up on file.  [name], MD

## 2016-11-29 NOTE — Assessment & Plan Note (Signed)
Worse Added Gabapentin at HS

## 2016-11-29 NOTE — Assessment & Plan Note (Signed)
Start Hytrin today Labs May need a renal US

## 2016-11-29 NOTE — Assessment & Plan Note (Signed)
On B12 

## 2016-11-29 NOTE — Assessment & Plan Note (Signed)
On Norco  Potential benefits of a long term opioids use as well as potential risks (i.e. addiction risk, apnea etc) and complications (i.e. Somnolence, constipation and others) were explained to the patient and were aknowledged.  

## 2016-11-29 NOTE — Assessment & Plan Note (Signed)
Chronic  On Norco  Potential benefits of a long term opioids use as well as potential risks (i.e. addiction risk, apnea etc) and complications (i.e. Somnolence, constipation and others) were explained to the patient and were aknowledged. 

## 2016-11-29 NOTE — Assessment & Plan Note (Signed)
Add Hytrin

## 2016-12-13 DIAGNOSIS — H353111 Nonexudative age-related macular degeneration, right eye, early dry stage: Secondary | ICD-10-CM | POA: Diagnosis not present

## 2016-12-13 DIAGNOSIS — H401132 Primary open-angle glaucoma, bilateral, moderate stage: Secondary | ICD-10-CM | POA: Diagnosis not present

## 2016-12-13 DIAGNOSIS — H353221 Exudative age-related macular degeneration, left eye, with active choroidal neovascularization: Secondary | ICD-10-CM | POA: Diagnosis not present

## 2016-12-13 DIAGNOSIS — H353121 Nonexudative age-related macular degeneration, left eye, early dry stage: Secondary | ICD-10-CM | POA: Diagnosis not present

## 2016-12-13 DIAGNOSIS — H43812 Vitreous degeneration, left eye: Secondary | ICD-10-CM | POA: Diagnosis not present

## 2016-12-13 DIAGNOSIS — H43392 Other vitreous opacities, left eye: Secondary | ICD-10-CM | POA: Diagnosis not present

## 2017-01-10 ENCOUNTER — Ambulatory Visit (INDEPENDENT_AMBULATORY_CARE_PROVIDER_SITE_OTHER): Payer: PPO | Admitting: Family Medicine

## 2017-01-10 ENCOUNTER — Encounter: Payer: Self-pay | Admitting: Family Medicine

## 2017-01-10 VITALS — BP 154/78 | HR 62 | Temp 97.5°F | Resp 12 | Ht 72.0 in | Wt 187.0 lb

## 2017-01-10 DIAGNOSIS — M7989 Other specified soft tissue disorders: Secondary | ICD-10-CM | POA: Diagnosis not present

## 2017-01-10 NOTE — Patient Instructions (Signed)
It was a pleasure to meet you today  Keep your leg elevated when seated  You will get a call about your ultrasound- if you don't hear by tomorrow morning, please call the office

## 2017-01-12 ENCOUNTER — Other Ambulatory Visit (INDEPENDENT_AMBULATORY_CARE_PROVIDER_SITE_OTHER): Payer: PPO

## 2017-01-12 ENCOUNTER — Encounter: Payer: Self-pay | Admitting: Internal Medicine

## 2017-01-12 ENCOUNTER — Ambulatory Visit (INDEPENDENT_AMBULATORY_CARE_PROVIDER_SITE_OTHER): Payer: PPO | Admitting: Internal Medicine

## 2017-01-12 ENCOUNTER — Ambulatory Visit (HOSPITAL_COMMUNITY)
Admission: RE | Admit: 2017-01-12 | Discharge: 2017-01-12 | Disposition: A | Discharge status: Home / Self Care | Payer: PPO | Source: Ambulatory Visit | Attending: Family Medicine | Admitting: Family Medicine

## 2017-01-12 ENCOUNTER — Encounter (HOSPITAL_COMMUNITY): Payer: Self-pay

## 2017-01-12 ENCOUNTER — Telehealth: Payer: Self-pay

## 2017-01-12 DIAGNOSIS — I10 Essential (primary) hypertension: Secondary | ICD-10-CM | POA: Diagnosis not present

## 2017-01-12 DIAGNOSIS — Z85828 Personal history of other malignant neoplasm of skin: Secondary | ICD-10-CM | POA: Diagnosis not present

## 2017-01-12 DIAGNOSIS — M79605 Pain in left leg: Secondary | ICD-10-CM

## 2017-01-12 DIAGNOSIS — E538 Deficiency of other specified B group vitamins: Secondary | ICD-10-CM | POA: Diagnosis not present

## 2017-01-12 DIAGNOSIS — C4442 Squamous cell carcinoma of skin of scalp and neck: Secondary | ICD-10-CM | POA: Diagnosis not present

## 2017-01-12 DIAGNOSIS — R601 Generalized edema: Secondary | ICD-10-CM | POA: Diagnosis not present

## 2017-01-12 DIAGNOSIS — M7989 Other specified soft tissue disorders: Secondary | ICD-10-CM

## 2017-01-12 DIAGNOSIS — R7989 Other specified abnormal findings of blood chemistry: Secondary | ICD-10-CM

## 2017-01-12 DIAGNOSIS — R946 Abnormal results of thyroid function studies: Secondary | ICD-10-CM

## 2017-01-12 LAB — CBC WITH DIFFERENTIAL/PLATELET
Basophils Absolute: 0 10*3/uL (ref 0.0–0.1)
Basophils Relative: 0.5 % (ref 0.0–3.0)
Eosinophils Absolute: 0.1 10*3/uL (ref 0.0–0.7)
Eosinophils Relative: 2.2 % (ref 0.0–5.0)
HCT: 32.9 % — ABNORMAL LOW (ref 39.0–52.0)
Hemoglobin: 11.4 g/dL — ABNORMAL LOW (ref 13.0–17.0)
Lymphocytes Relative: 19.8 % (ref 12.0–46.0)
Lymphs Abs: 1.3 10*3/uL (ref 0.7–4.0)
MCHC: 34.6 g/dL (ref 30.0–36.0)
MCV: 89 fl (ref 78.0–100.0)
Monocytes Absolute: 0.5 10*3/uL (ref 0.1–1.0)
Monocytes Relative: 8.1 % (ref 3.0–12.0)
Neutro Abs: 4.6 10*3/uL (ref 1.4–7.7)
Neutrophils Relative %: 69.4 % (ref 43.0–77.0)
Platelets: 195 10*3/uL (ref 150.0–400.0)
RBC: 3.7 Mil/uL — ABNORMAL LOW (ref 4.22–5.81)
RDW: 13.9 % (ref 11.5–15.5)
WBC: 6.6 10*3/uL (ref 4.0–10.5)

## 2017-01-12 LAB — URINALYSIS
Bilirubin Urine: NEGATIVE
Hgb urine dipstick: NEGATIVE
Ketones, ur: NEGATIVE
Leukocytes, UA: NEGATIVE
Nitrite: NEGATIVE
Specific Gravity, Urine: 1.015 (ref 1.000–1.030)
Total Protein, Urine: NEGATIVE
Urine Glucose: NEGATIVE
Urobilinogen, UA: 0.2 (ref 0.0–1.0)
pH: 6 (ref 5.0–8.0)

## 2017-01-12 LAB — HEPATIC FUNCTION PANEL
ALT: 13 U/L (ref 0–53)
AST: 20 U/L (ref 0–37)
Albumin: 4.3 g/dL (ref 3.5–5.2)
Alkaline Phosphatase: 53 U/L (ref 39–117)
Bilirubin, Direct: 0.1 mg/dL (ref 0.0–0.3)
Total Bilirubin: 0.5 mg/dL (ref 0.2–1.2)
Total Protein: 6.6 g/dL (ref 6.0–8.3)

## 2017-01-12 LAB — LIPID PANEL
Cholesterol: 136 mg/dL (ref 0–200)
HDL: 54.7 mg/dL (ref >39.00)
LDL Cholesterol: 68 mg/dL (ref 0–99)
NonHDL: 81.65
Total CHOL/HDL Ratio: 2
Triglycerides: 67 mg/dL (ref 0.0–149.0)
VLDL: 13.4 mg/dL (ref 0.0–40.0)

## 2017-01-12 LAB — BASIC METABOLIC PANEL
BUN: 21 mg/dL (ref 6–23)
CO2: 26 mEq/L (ref 19–32)
Calcium: 9.4 mg/dL (ref 8.4–10.5)
Chloride: 107 mEq/L (ref 96–112)
Creatinine, Ser: 1.12 mg/dL (ref 0.40–1.50)
GFR: 67.45 mL/min (ref >60.00)
Glucose, Bld: 86 mg/dL (ref 70–99)
Potassium: 5 mEq/L (ref 3.5–5.1)
Sodium: 140 mEq/L (ref 135–145)

## 2017-01-12 LAB — BRAIN NATRIURETIC PEPTIDE: Pro B Natriuretic peptide (BNP): 292 pg/mL — ABNORMAL HIGH (ref 0.0–100.0)

## 2017-01-12 LAB — TSH: TSH: 3.11 u[IU]/mL (ref 0.35–4.50)

## 2017-01-12 MED ORDER — FUROSEMIDE 20 MG PO TABS: 20.0000 mg | ORAL_TABLET | Freq: Every day | ORAL | 3 refills | Status: DC | PRN

## 2017-01-12 NOTE — Telephone Encounter (Signed)
Pt came by the office

## 2017-01-12 NOTE — Patient Instructions (Signed)
Stop Ibuprofen Elevate legs Compression socks

## 2017-01-12 NOTE — Progress Notes (Signed)
Subjective:  Patient ID: Justin [name]., male    DOB: 1941/01/03  Age: 76 y.o. MRN: [id number]  CC: No chief complaint on file.   HPI Saverio Kader Wamsutter. presents for leg edema L>R and today had R cheek edema. No new meds. Doppler US was (-) for DVT  Outpatient Medications Prior to Visit  Medication Sig Dispense Refill  . aspirin 81 MG EC tablet Take 81 mg by mouth daily.      . brinzolamide (AZOPT) 1 % ophthalmic suspension 1 drop 3 (three) times daily.    . Cholecalciferol 1000 UNITS tablet Take 1,000 Units by mouth daily.      . Cyanocobalamin (VITAMIN B-12) 1000 MCG SUBL DISSOLVE 2 TABLETS IN MOUTH EVERY DAY 60 tablet 8  . finasteride (PROSCAR) 5 MG tablet TAKE 1 TABLET (5 MG TOTAL) BY MOUTH DAILY. 90 tablet 3  . gabapentin (NEURONTIN) 300 MG capsule Take 1 capsule (300 mg total) by mouth at bedtime. 30 capsule 3  . HYDROcodone-acetaminophen (NORCO) 7.5-325 MG tablet Take 1 tablet by mouth every 6 (six) hours as needed for severe pain. Please fill on or after 12/31/16 120 tablet 0  . latanoprost (XALATAN) 0.005 % ophthalmic solution Place 1 drop into both eyes daily.    . meclizine (ANTIVERT) 12.5 MG tablet Take 1 tablet (12.5 mg total) by mouth 3 (three) times daily as needed for dizziness. 60 tablet 1  . Multiple Vitamin (MULTIVITAMIN) capsule Take 1 capsule by mouth daily.      . ondansetron (ZOFRAN) 4 MG tablet Take 1 tablet (4 mg total) by mouth every 8 (eight) hours as needed for nausea or vomiting. 20 tablet 0  . tamsulosin (FLOMAX) 0.4 MG CAPS capsule TAKE ONE CAPSULE EVERY DAY 90 capsule 1  . terazosin (HYTRIN) 2 MG capsule Take 1 capsule (2 mg total) by mouth at bedtime. 30 capsule 11   No facility-administered medications prior to visit.     ROS Review of Systems  Constitutional: Negative for appetite change, fatigue and unexpected weight change.  HENT: Negative for congestion, nosebleeds, sneezing, sore throat and trouble swallowing.   Eyes: Negative for  itching and visual disturbance.  Respiratory: Negative for cough.   Cardiovascular: Positive for leg swelling. Negative for chest pain and palpitations.  Gastrointestinal: Negative for abdominal distention, blood in stool, diarrhea and nausea.  Genitourinary: Negative for frequency and hematuria.  Musculoskeletal: Positive for arthralgias, back pain and gait problem. Negative for joint swelling and neck pain.  Skin: Negative for rash.  Neurological: Negative for dizziness, tremors, speech difficulty and weakness.  Psychiatric/Behavioral: Negative for agitation, dysphoric mood and sleep disturbance. The patient is not nervous/anxious.     Objective:  BP 140/82 (BP Location: Left Arm, Patient Position: Sitting, Cuff Size: Normal)   Pulse (!) 51   Temp 97.7 F (36.5 C) (Oral)   Ht 6' (1.829 m)   Wt 188 lb (85.3 kg)   SpO2 100%   BMI 25.50 kg/m   BP Readings from Last 3 Encounters:  01/12/17 140/82  01/10/17 (!) 154/78  11/29/16 (!) 162/88    Wt Readings from Last 3 Encounters:  01/12/17 188 lb (85.3 kg)  01/10/17 187 lb (84.8 kg)  11/29/16 181 lb (82.1 kg)    Physical Exam  Constitutional: He is oriented to person, place, and time. He appears well-developed. No distress.  NAD  HENT:  Mouth/Throat: Oropharynx is clear and moist.  Eyes: Pupils are equal, round, and reactive to light. Conjunctivae are  normal.  Neck: Normal range of motion. No JVD present. No thyromegaly present.  Cardiovascular: Normal rate, regular rhythm, normal heart sounds and intact distal pulses.  Exam reveals no gallop and no friction rub.   No murmur heard. Pulmonary/Chest: Effort normal and breath sounds normal. No respiratory distress. He has no wheezes. He has no rales. He exhibits no tenderness.  Abdominal: Soft. Bowel sounds are normal. He exhibits no distension and no mass. There is no tenderness. There is no rebound and no guarding.  Musculoskeletal: Normal range of motion. He exhibits  tenderness. He exhibits no edema (L> R leg  1+edema).  Lymphadenopathy:    He has no cervical adenopathy.  Neurological: He is alert and oriented to person, place, and time. He has normal reflexes. No cranial nerve deficit. He exhibits normal muscle tone. He displays a negative Romberg sign. Coordination and gait normal.  Skin: Skin is warm and dry. No rash noted.  Psychiatric: He has a normal mood and affect. His behavior is normal. Judgment and thought content normal.  R cheek and L forearm - watery edema  Lab Results  Component Value Date   WBC 6.8 08/30/2016   HGB 12.3 (L) 08/30/2016   HCT 36.5 (L) 08/30/2016   PLT 236.0 08/30/2016   GLUCOSE 97 08/30/2016   CHOL 157 08/30/2016   TRIG 58.0 08/30/2016   HDL 64.80 08/30/2016   LDLCALC 80 08/30/2016   ALT 10 08/30/2016   AST 16 08/30/2016   NA 141 08/30/2016   K 4.2 08/30/2016   CL 106 08/30/2016   CREATININE 0.99 08/30/2016   BUN 18 08/30/2016   CO2 27 08/30/2016   TSH 4.20 08/30/2016   PSA 0.47 08/30/2016   HGBA1C 5.6 07/28/2015    No results found.  Assessment & Plan:   There are no diagnoses linked to this encounter. I am having Mr. Whidby maintain his aspirin, multivitamin, Cholecalciferol, latanoprost, ondansetron, meclizine, Vitamin B-12, finasteride, tamsulosin, brinzolamide, terazosin, HYDROcodone-acetaminophen, and gabapentin.  No orders of the defined types were placed in this encounter.    Follow-up: No Follow-up on file.  [name], MD

## 2017-01-12 NOTE — Progress Notes (Signed)
Subjective:    Patient ID: Justin Pagan Su Grand., male    DOB: July 11, 1940, 76 y.o.   MRN: [id number]  HPI This is a 76 yo male who presents today with new left leg swelling x 2 days. First noticed two nights ago. No known injury, no pain, no unusual activity. Has not been on his feet more than usual. Does not go down significantly at night. No cough, no chest pain, no SOB.   Past Medical History:  Diagnosis Date  . Alcoholism ([place])    Dry 13 years  . Basal cell carcinoma    Right Chest  . Chronic insomnia    On ambien x 2 years  . Depression   . Diverticulosis of colon   . Dizzy spells   . Elbow fracture, left 2009   [name]  . Glaucoma   . History of TIAs   . Hyperkeratosis   . LBP (low back pain)   . Melanoma [facility]) 2009   x3 [name]  . Osteoarthritis   . Restless leg syndrome   . Tinnitus   . Tobacco abuse    Past Surgical History:  Procedure Laterality Date  . CATARACT EXTRACTION    . MELANOMA EXCISION  2009   x3  . RECONSTRUCTION MEDIAL COLLATERAL LIGAMENT ELBOW W/ TENDON GRAFT  2009   Left, [name]   Family History  Problem Relation Age of Onset  . Cancer Mother        ?breast  . Cancer Father        Lung   Social History  Substance Use Topics  . Smoking status: Former Smoker    Packs/day: 3.00    Years: 38.00    Types: Cigarettes    Quit date: 08/30/1990  . Smokeless tobacco: Never Used     Comment: states he quit 1986  . Alcohol use No     Comment: PREVIOUS - DRY 13 yrs      Review of Systems Per HPI    Objective:   Physical Exam  Constitutional: He is oriented to person, place, and time. He appears well-developed and well-nourished. No distress.  HENT:  Head: Normocephalic and atraumatic.  Cardiovascular: Normal rate, regular rhythm and normal heart sounds.   Pulmonary/Chest: Effort normal and breath sounds normal.  Musculoskeletal: He exhibits edema.  Bilateral legs with varicosities. Left lower leg noticeably edematous, +1-  +2 edema, calf tight, non tender to palpation. No erythema. Pedal pulses palpable.   Neurological: He is alert and oriented to person, place, and time.  Skin: Skin is warm and dry. He is not diaphoretic.  Psychiatric: He has a normal mood and affect. His behavior is normal. Judgment and thought content normal.  Vitals reviewed.     BP (!) 154/78   Pulse 62   Temp (!) 97.5 F (36.4 C) (Oral)   Resp 12   Ht 6' (1.829 m)   Wt 187 lb (84.8 kg)   SpO2 99%   BMI 25.36 kg/m  BP Readings from Last 3 Encounters:  01/12/17 140/82  01/10/17 (!) 154/78  11/29/16 (!) 162/88       Assessment & Plan:  1. Left leg swelling - new onset and unilateral, will get doppler US to r/o DVT - Provided written and verbal information regarding diagnosis and treatment. - elevate as much as possible - RTC precautions reviewed - VAS [place] LOWER EXTREMITY VENOUS (DVT); Future   Clarene Reamer, FNP-BC  [facility] [place]  [place], [facility]  01/12/2017 6:01 PM

## 2017-01-12 NOTE — Telephone Encounter (Signed)
Please call patient and let him know that his ultrasound was negative for a clot in his leg.

## 2017-01-12 NOTE — Telephone Encounter (Signed)
Greg with Vascular called and stated that the imaging for pt came back negative.

## 2017-01-12 NOTE — Assessment & Plan Note (Signed)
Labs Stop Ibuprofen Elevate legs Compression socks

## 2017-01-12 NOTE — Progress Notes (Addendum)
**  Preliminary report by tech**  Left lower extremity venous duplex complete. There is no evidence of deep or superficial vein thrombosis involving the left lower extremity. All visualized vessels appear patent and compressible. There is no evidence of a Baker's cyst on the left. Results were given to [facility] [place] office.  01/12/17 2:34 PM Carlos Levering RVT

## 2017-01-12 NOTE — Telephone Encounter (Signed)
Called and spoke to patients wife informing her of results. Okay to speak with Wife per Ocie Bob, Understanding verbalized nothing further needed at this time.

## 2017-01-14 ENCOUNTER — Other Ambulatory Visit: Payer: Self-pay | Admitting: Internal Medicine

## 2017-01-14 DIAGNOSIS — R0600 Dyspnea, unspecified: Secondary | ICD-10-CM

## 2017-01-14 DIAGNOSIS — R609 Edema, unspecified: Secondary | ICD-10-CM

## 2017-01-15 ENCOUNTER — Telehealth: Payer: Self-pay | Admitting: Internal Medicine

## 2017-01-15 ENCOUNTER — Other Ambulatory Visit: Payer: Self-pay | Admitting: Family Medicine

## 2017-01-15 DIAGNOSIS — M7989 Other specified soft tissue disorders: Principal | ICD-10-CM

## 2017-01-15 DIAGNOSIS — M79605 Pain in left leg: Secondary | ICD-10-CM

## 2017-01-15 NOTE — Telephone Encounter (Signed)
noted 

## 2017-01-15 NOTE — Telephone Encounter (Signed)
Pt informed of test results.

## 2017-01-19 ENCOUNTER — Ambulatory Visit (HOSPITAL_COMMUNITY)
Admission: RE | Admit: 2017-01-19 | Discharge: 2017-01-19 | Disposition: A | Discharge status: Home / Self Care | Payer: PPO | Source: Ambulatory Visit | Attending: Internal Medicine | Admitting: Internal Medicine

## 2017-01-19 DIAGNOSIS — R0600 Dyspnea, unspecified: Secondary | ICD-10-CM | POA: Diagnosis not present

## 2017-01-19 DIAGNOSIS — I420 Dilated cardiomyopathy: Secondary | ICD-10-CM | POA: Diagnosis not present

## 2017-01-19 DIAGNOSIS — R609 Edema, unspecified: Secondary | ICD-10-CM

## 2017-01-19 NOTE — Progress Notes (Signed)
  Echocardiogram 2D Echocardiogram has been performed.  Justin [name] 01/19/2017, 3:06 PM

## 2017-01-23 ENCOUNTER — Telehealth: Payer: Self-pay | Admitting: Internal Medicine

## 2017-01-23 ENCOUNTER — Other Ambulatory Visit: Payer: Self-pay

## 2017-01-23 MED ORDER — GABAPENTIN 300 MG PO CAPS: 300.0000 mg | ORAL_CAPSULE | Freq: Every day | ORAL | 1 refills | Status: DC

## 2017-01-23 NOTE — Telephone Encounter (Signed)
Patient is requesting results of echocardiogram.  Would like to know if Dr. Alain Marion has reviewed yet.

## 2017-01-24 DIAGNOSIS — H43812 Vitreous degeneration, left eye: Secondary | ICD-10-CM | POA: Diagnosis not present

## 2017-01-24 DIAGNOSIS — H353111 Nonexudative age-related macular degeneration, right eye, early dry stage: Secondary | ICD-10-CM | POA: Diagnosis not present

## 2017-01-24 DIAGNOSIS — H401132 Primary open-angle glaucoma, bilateral, moderate stage: Secondary | ICD-10-CM | POA: Diagnosis not present

## 2017-01-24 DIAGNOSIS — H43392 Other vitreous opacities, left eye: Secondary | ICD-10-CM | POA: Diagnosis not present

## 2017-01-24 DIAGNOSIS — H353221 Exudative age-related macular degeneration, left eye, with active choroidal neovascularization: Secondary | ICD-10-CM | POA: Diagnosis not present

## 2017-01-24 DIAGNOSIS — H353121 Nonexudative age-related macular degeneration, left eye, early dry stage: Secondary | ICD-10-CM | POA: Diagnosis not present

## 2017-01-24 NOTE — Telephone Encounter (Signed)
Pt was given results of echo

## 2017-01-31 ENCOUNTER — Encounter: Payer: Self-pay | Admitting: Internal Medicine

## 2017-01-31 ENCOUNTER — Ambulatory Visit (INDEPENDENT_AMBULATORY_CARE_PROVIDER_SITE_OTHER): Payer: PPO | Admitting: Internal Medicine

## 2017-01-31 DIAGNOSIS — N401 Enlarged prostate with lower urinary tract symptoms: Secondary | ICD-10-CM

## 2017-01-31 DIAGNOSIS — I10 Essential (primary) hypertension: Secondary | ICD-10-CM | POA: Diagnosis not present

## 2017-01-31 DIAGNOSIS — M5440 Lumbago with sciatica, unspecified side: Secondary | ICD-10-CM

## 2017-01-31 DIAGNOSIS — M79642 Pain in left hand: Secondary | ICD-10-CM | POA: Diagnosis not present

## 2017-01-31 MED ORDER — HYDROCODONE-ACETAMINOPHEN 7.5-325 MG PO TABS: 1.0000 | ORAL_TABLET | Freq: Four times a day (QID) | ORAL | 0 refills | Status: DC | PRN

## 2017-01-31 NOTE — Assessment & Plan Note (Signed)
Proscar, Tamsulosin Hytrin 

## 2017-01-31 NOTE — Assessment & Plan Note (Signed)
On Norco  Potential benefits of a long term opioids use as well as potential risks (i.e. addiction risk, apnea etc) and complications (i.e. Somnolence, constipation and others) were explained to the patient and were aknowledged.  

## 2017-01-31 NOTE — Progress Notes (Signed)
Subjective:  Patient ID: Justin Pagan Su Grand., male    DOB: 1940/07/06  Age: 76 y.o. MRN: [id number]  CC: No chief complaint on file.   HPI Justin [name]. presents for edema f/u F/u chronic pain, B12 def, BPH  Outpatient Medications Prior to Visit  Medication Sig Dispense Refill  . aspirin 81 MG EC tablet Take 81 mg by mouth daily.      . brinzolamide (AZOPT) 1 % ophthalmic suspension 1 drop 3 (three) times daily.    . Cholecalciferol 1000 UNITS tablet Take 1,000 Units by mouth daily.      . Cyanocobalamin (VITAMIN B-12) 1000 MCG SUBL DISSOLVE 2 TABLETS IN MOUTH EVERY DAY 60 tablet 8  . finasteride (PROSCAR) 5 MG tablet TAKE 1 TABLET (5 MG TOTAL) BY MOUTH DAILY. 90 tablet 3  . furosemide (LASIX) 20 MG tablet Take 1-2 tablets (20-40 mg total) by mouth daily as needed. 60 tablet 3  . gabapentin (NEURONTIN) 300 MG capsule Take 1 capsule (300 mg total) by mouth at bedtime. 90 capsule 1  . HYDROcodone-acetaminophen (NORCO) 7.5-325 MG tablet Take 1 tablet by mouth every 6 (six) hours as needed for severe pain. Please fill on or after 12/31/16 120 tablet 0  . latanoprost (XALATAN) 0.005 % ophthalmic solution Place 1 drop into both eyes daily.    . meclizine (ANTIVERT) 12.5 MG tablet Take 1 tablet (12.5 mg total) by mouth 3 (three) times daily as needed for dizziness. 60 tablet 1  . Multiple Vitamin (MULTIVITAMIN) capsule Take 1 capsule by mouth daily.      . ondansetron (ZOFRAN) 4 MG tablet Take 1 tablet (4 mg total) by mouth every 8 (eight) hours as needed for nausea or vomiting. 20 tablet 0  . tamsulosin (FLOMAX) 0.4 MG CAPS capsule TAKE ONE CAPSULE EVERY DAY 90 capsule 1  . terazosin (HYTRIN) 2 MG capsule Take 1 capsule (2 mg total) by mouth at bedtime. 30 capsule 11   No facility-administered medications prior to visit.     ROS Review of Systems  Constitutional: Negative for appetite change, fatigue and unexpected weight change.  HENT: Negative for congestion, nosebleeds,  sneezing, sore throat and trouble swallowing.   Eyes: Negative for itching and visual disturbance.  Respiratory: Negative for cough.   Cardiovascular: Positive for leg swelling. Negative for chest pain and palpitations.  Gastrointestinal: Negative for abdominal distention, blood in stool, diarrhea and nausea.  Genitourinary: Negative for frequency and hematuria.  Musculoskeletal: Positive for arthralgias and back pain. Negative for gait problem, joint swelling and neck pain.  Skin: Negative for rash.  Neurological: Negative for dizziness, tremors, speech difficulty and weakness.  Psychiatric/Behavioral: Negative for agitation, dysphoric mood and sleep disturbance. The patient is not nervous/anxious.     Objective:  BP 132/80 (BP Location: Right Arm, Patient Position: Sitting, Cuff Size: Normal)   Pulse (!) 56   Temp 97.8 F (36.6 C) (Oral)   Ht 6' (1.829 m)   Wt 188 lb (85.3 kg)   SpO2 100%   BMI 25.50 kg/m   BP Readings from Last 3 Encounters:  01/31/17 132/80  01/12/17 140/82  01/10/17 (!) 154/78    Wt Readings from Last 3 Encounters:  01/31/17 188 lb (85.3 kg)  01/12/17 188 lb (85.3 kg)  01/10/17 187 lb (84.8 kg)    Physical Exam  Constitutional: He is oriented to person, place, and time. He appears well-developed. No distress.  NAD  HENT:  Mouth/Throat: Oropharynx is clear and moist.  Eyes:  Pupils are equal, round, and reactive to light. Conjunctivae are normal.  Neck: Normal range of motion. No JVD present. No thyromegaly present.  Cardiovascular: Normal rate, regular rhythm, normal heart sounds and intact distal pulses.  Exam reveals no gallop and no friction rub.   No murmur heard. Pulmonary/Chest: Effort normal and breath sounds normal. No respiratory distress. He has no wheezes. He has no rales. He exhibits no tenderness.  Abdominal: Soft. Bowel sounds are normal. He exhibits no distension and no mass. There is no tenderness. There is no rebound and no guarding.    Musculoskeletal: Normal range of motion. He exhibits edema. He exhibits no tenderness.  Lymphadenopathy:    He has no cervical adenopathy.  Neurological: He is alert and oriented to person, place, and time. He has normal reflexes. No cranial nerve deficit. He exhibits normal muscle tone. He displays a negative Romberg sign. Coordination and gait normal.  Skin: Skin is warm and dry. No rash noted.  Psychiatric: He has a normal mood and affect. His behavior is normal. Judgment and thought content normal.   Trace edema B occ irreg beats   Lab Results  Component Value Date   WBC 6.6 01/12/2017   HGB 11.4 (L) 01/12/2017   HCT 32.9 (L) 01/12/2017   PLT 195.0 01/12/2017   GLUCOSE 86 01/12/2017   CHOL 136 01/12/2017   TRIG 67.0 01/12/2017   HDL 54.70 01/12/2017   LDLCALC 68 01/12/2017   ALT 13 01/12/2017   AST 20 01/12/2017   NA 140 01/12/2017   K 5.0 01/12/2017   CL 107 01/12/2017   CREATININE 1.12 01/12/2017   BUN 21 01/12/2017   CO2 26 01/12/2017   TSH 3.11 01/12/2017   PSA 0.47 08/30/2016   HGBA1C 5.6 07/28/2015    No results found.  Assessment & Plan:   There are no diagnoses linked to this encounter. I am having Justin [name] maintain his aspirin, multivitamin, Cholecalciferol, latanoprost, ondansetron, meclizine, Vitamin B-12, finasteride, tamsulosin, brinzolamide, terazosin, HYDROcodone-acetaminophen, furosemide, and gabapentin.  No orders of the defined types were placed in this encounter.    Follow-up: No Follow-up on file.  [name], MD

## 2017-01-31 NOTE — Assessment & Plan Note (Signed)
Hytrin 

## 2017-02-01 DIAGNOSIS — Z85828 Personal history of other malignant neoplasm of skin: Secondary | ICD-10-CM | POA: Diagnosis not present

## 2017-02-01 DIAGNOSIS — C4442 Squamous cell carcinoma of skin of scalp and neck: Secondary | ICD-10-CM | POA: Diagnosis not present

## 2017-02-09 ENCOUNTER — Other Ambulatory Visit: Payer: Self-pay | Admitting: Internal Medicine

## 2017-02-09 DIAGNOSIS — H353113 Nonexudative age-related macular degeneration, right eye, advanced atrophic without subfoveal involvement: Secondary | ICD-10-CM | POA: Diagnosis not present

## 2017-02-09 DIAGNOSIS — H402222 Chronic angle-closure glaucoma, left eye, moderate stage: Secondary | ICD-10-CM | POA: Diagnosis not present

## 2017-02-09 DIAGNOSIS — H402211 Chronic angle-closure glaucoma, right eye, mild stage: Secondary | ICD-10-CM | POA: Diagnosis not present

## 2017-02-09 DIAGNOSIS — H353221 Exudative age-related macular degeneration, left eye, with active choroidal neovascularization: Secondary | ICD-10-CM | POA: Diagnosis not present

## 2017-02-19 ENCOUNTER — Other Ambulatory Visit: Payer: Self-pay | Admitting: Internal Medicine

## 2017-02-21 DIAGNOSIS — D1801 Hemangioma of skin and subcutaneous tissue: Secondary | ICD-10-CM | POA: Diagnosis not present

## 2017-02-21 DIAGNOSIS — L570 Actinic keratosis: Secondary | ICD-10-CM | POA: Diagnosis not present

## 2017-02-21 DIAGNOSIS — Z85828 Personal history of other malignant neoplasm of skin: Secondary | ICD-10-CM | POA: Diagnosis not present

## 2017-02-21 DIAGNOSIS — D2261 Melanocytic nevi of right upper limb, including shoulder: Secondary | ICD-10-CM | POA: Diagnosis not present

## 2017-02-21 DIAGNOSIS — D225 Melanocytic nevi of trunk: Secondary | ICD-10-CM | POA: Diagnosis not present

## 2017-02-21 DIAGNOSIS — L821 Other seborrheic keratosis: Secondary | ICD-10-CM | POA: Diagnosis not present

## 2017-03-07 NOTE — Assessment & Plan Note (Signed)
BP Readings from Last 3 Encounters:  01/31/17 132/80  01/12/17 140/82  01/10/17 (!) 154/78

## 2017-03-07 NOTE — Assessment & Plan Note (Signed)
Monitor TSH 

## 2017-03-07 NOTE — Assessment & Plan Note (Signed)
Cont w/B12 

## 2017-03-14 DIAGNOSIS — H353121 Nonexudative age-related macular degeneration, left eye, early dry stage: Secondary | ICD-10-CM | POA: Diagnosis not present

## 2017-03-14 DIAGNOSIS — H353111 Nonexudative age-related macular degeneration, right eye, early dry stage: Secondary | ICD-10-CM | POA: Diagnosis not present

## 2017-03-14 DIAGNOSIS — H3562 Retinal hemorrhage, left eye: Secondary | ICD-10-CM | POA: Diagnosis not present

## 2017-03-14 DIAGNOSIS — H353222 Exudative age-related macular degeneration, left eye, with inactive choroidal neovascularization: Secondary | ICD-10-CM | POA: Diagnosis not present

## 2017-03-27 ENCOUNTER — Other Ambulatory Visit: Payer: Self-pay | Admitting: Internal Medicine

## 2017-03-27 ENCOUNTER — Ambulatory Visit (INDEPENDENT_AMBULATORY_CARE_PROVIDER_SITE_OTHER): Payer: PPO

## 2017-03-27 DIAGNOSIS — Z23 Encounter for immunization: Secondary | ICD-10-CM

## 2017-04-04 DIAGNOSIS — H43392 Other vitreous opacities, left eye: Secondary | ICD-10-CM | POA: Diagnosis not present

## 2017-04-04 DIAGNOSIS — H353221 Exudative age-related macular degeneration, left eye, with active choroidal neovascularization: Secondary | ICD-10-CM | POA: Diagnosis not present

## 2017-04-04 DIAGNOSIS — H353111 Nonexudative age-related macular degeneration, right eye, early dry stage: Secondary | ICD-10-CM | POA: Diagnosis not present

## 2017-04-04 DIAGNOSIS — H353121 Nonexudative age-related macular degeneration, left eye, early dry stage: Secondary | ICD-10-CM | POA: Diagnosis not present

## 2017-04-10 ENCOUNTER — Telehealth: Payer: Self-pay | Admitting: Internal Medicine

## 2017-04-10 MED ORDER — ROPINIROLE HCL 0.5 MG PO TABS: 0.5000 mg | ORAL_TABLET | Freq: Every day | ORAL | 5 refills | Status: DC

## 2017-04-10 NOTE — Telephone Encounter (Signed)
Ok Requip - Rx emailed Thx

## 2017-04-10 NOTE — Telephone Encounter (Signed)
Patient states he is very nervous and he has to move his legs at night.  States is not restless leg syndrome.  States he has seen Dr. Alain Marion for this in the past.  States does not want to come in to see Dr. Camila Li.  States he had prescribed him a medication that was not gabapentin for this and would like to be prescribed this med again without an appointment.  Patient requested this to be asked of Dr. Alain Marion.  [facility] uses [facility] on [address].

## 2017-04-11 NOTE — Telephone Encounter (Signed)
Notified pt w/MD response.../lmb 

## 2017-05-03 ENCOUNTER — Encounter: Payer: Self-pay | Admitting: Internal Medicine

## 2017-05-03 ENCOUNTER — Ambulatory Visit (INDEPENDENT_AMBULATORY_CARE_PROVIDER_SITE_OTHER): Payer: PPO | Admitting: Internal Medicine

## 2017-05-03 VITALS — BP 132/78 | HR 53 | Temp 97.6°F | Resp 20 | Ht 72.0 in | Wt 186.0 lb

## 2017-05-03 DIAGNOSIS — M5440 Lumbago with sciatica, unspecified side: Secondary | ICD-10-CM | POA: Diagnosis not present

## 2017-05-03 DIAGNOSIS — G2581 Restless legs syndrome: Secondary | ICD-10-CM | POA: Diagnosis not present

## 2017-05-03 DIAGNOSIS — E538 Deficiency of other specified B group vitamins: Secondary | ICD-10-CM

## 2017-05-03 DIAGNOSIS — Z0000 Encounter for general adult medical examination without abnormal findings: Secondary | ICD-10-CM

## 2017-05-03 DIAGNOSIS — M79642 Pain in left hand: Secondary | ICD-10-CM

## 2017-05-03 MED ORDER — HYDROCODONE-ACETAMINOPHEN 7.5-325 MG PO TABS: 1.0000 | ORAL_TABLET | Freq: Four times a day (QID) | ORAL | 0 refills | Status: DC | PRN

## 2017-05-03 NOTE — Assessment & Plan Note (Signed)
On B12 

## 2017-05-03 NOTE — Assessment & Plan Note (Signed)
On Norco  Potential benefits of a long term opioids use as well as potential risks (i.e. addiction risk, apnea etc) and complications (i.e. Somnolence, constipation and others) were explained to the patient and were aknowledged.  

## 2017-05-03 NOTE — Assessment & Plan Note (Signed)
On Requip - better

## 2017-05-03 NOTE — Progress Notes (Signed)
Pre visit review using our clinic review tool, if applicable. No additional management support is needed unless otherwise documented below in the visit note. 

## 2017-05-03 NOTE — Progress Notes (Signed)
Subjective:   Justin [name]. is a 76 y.o. male who presents for Medicare Annual/Subsequent preventive examination.  Review of Systems:  No ROS.  Medicare Wellness Visit. Additional risk factors are reflected in the social history.    Sleep patterns: gets up 1-2 times nightly to void and sleeps 6-7 hours nightly.    Home Safety/Smoke Alarms: Feels safe in home. Smoke alarms in place.  Living environment; residence and Firearm Safety: 1-story house/ trailer, no firearms, Lives with wife, no needs for DME, good support system Seat Belt Safety/Bike Helmet: Wears seat belt.     Objective:    Vitals: There were no vitals taken for this visit.  There is no height or weight on file to calculate BMI.  Tobacco History  Smoking Status  . Former Smoker  . Packs/day: 3.00  . Years: 38.00  . Types: Cigarettes  . Quit date: 08/30/1990  Smokeless Tobacco  . Never Used    Comment: states he quit 1986     Counseling given: Not Answered   Past Medical History:  Diagnosis Date  . Alcoholism ([place])    Dry 13 years  . Basal cell carcinoma    Right Chest  . Chronic insomnia    On ambien x 2 years  . Depression   . Diverticulosis of colon   . Dizzy spells   . Elbow fracture, left 2009   [name]  . Glaucoma   . History of TIAs   . Hyperkeratosis   . LBP (low back pain)   . Melanoma [facility]) 2009   x3 [name]  . Osteoarthritis   . Restless leg syndrome   . Tinnitus   . Tobacco abuse    Past Surgical History:  Procedure Laterality Date  . CATARACT EXTRACTION    . MELANOMA EXCISION  2009   x3  . RECONSTRUCTION MEDIAL COLLATERAL LIGAMENT ELBOW W/ TENDON GRAFT  2009   Left, [name]   Family History  Problem Relation Age of Onset  . Cancer Mother        ?breast  . Cancer Father        Lung   History  Sexual Activity  . Sexual activity: Not Currently    Outpatient Encounter Prescriptions as of 05/03/2017  Medication Sig  . aspirin 81 MG EC tablet Take 81  mg by mouth daily.    . brinzolamide (AZOPT) 1 % ophthalmic suspension 1 drop 3 (three) times daily.  . Cholecalciferol 1000 UNITS tablet Take 1,000 Units by mouth daily.    . Cyanocobalamin (VITAMIN B-12) 1000 MCG SUBL DISSOLVE 2 TABLETS IN MOUTH EVERY DAY  . finasteride (PROSCAR) 5 MG tablet TAKE 1 TABLET (5 MG TOTAL) BY MOUTH DAILY.  . furosemide (LASIX) 20 MG tablet Take 1-2 tablets (20-40 mg total) by mouth daily as needed.  . gabapentin (NEURONTIN) 300 MG capsule Take 1 capsule (300 mg total) by mouth at bedtime.  . gabapentin (NEURONTIN) 300 MG capsule TAKE 1 CAPSULE (300 MG TOTAL) BY MOUTH AT BEDTIME.  [name] HYDROcodone-acetaminophen (NORCO) 7.5-325 MG tablet Take 1 tablet by mouth every 6 (six) hours as needed for severe pain. Please fill on or after 04/02/17  . latanoprost (XALATAN) 0.005 % ophthalmic solution Place 1 drop into both eyes daily.  . meclizine (ANTIVERT) 12.5 MG tablet Take 1 tablet (12.5 mg total) by mouth 3 (three) times daily as needed for dizziness.  . Multiple Vitamin (MULTIVITAMIN) capsule Take 1 capsule by mouth daily.    [name]  ondansetron (ZOFRAN) 4 MG tablet Take 1 tablet (4 mg total) by mouth every 8 (eight) hours as needed for nausea or vomiting.  [name] rOPINIRole (REQUIP) 0.5 MG tablet Take 1 tablet (0.5 mg total) by mouth at bedtime.  . tamsulosin (FLOMAX) 0.4 MG CAPS capsule TAKE ONE CAPSULE EVERY DAY  . terazosin (HYTRIN) 2 MG capsule Take 1 capsule (2 mg total) by mouth at bedtime.   No facility-administered encounter medications on file as of 05/03/2017.     Activities of Daily Living In your present state of health, do you have any difficulty performing the following activities: 05/30/2016  Hearing? N  Vision? Y  Comment Issue with night driving.  Difficulty concentrating or making decisions? N  Walking or climbing stairs? N  Dressing or bathing? N  Doing errands, shopping? N  Some recent data might be hidden    Patient Care Team: Plotnikov, Evie Lacks, MD  as PCP - General Carol Ada, MD as Consulting Physician (Gastroenterology)   Assessment:    Physical assessment deferred to PCP.  Exercise Activities and Dietary recommendations   Diet (meal preparation, eat out, water intake, caffeinated beverages, dairy products, fruits and vegetables): in general, a "healthy" diet  , well balanced, eats a variety of fruits and vegetables daily, limits salt, fat/cholesterol, sugar, caffeine, drinks 2-3 glasses of water daily.   Encouraged patient to increase daily water intake.   Goals      Patient Stated   . patient (pt-stated)          Wants to stay healthy;  Be with grand-children      Fall Risk Fall Risk  11/29/2016 07/30/2015 01/14/2015 10/21/2014  Falls in the past year? No No No No   Depression Screen PHQ 2/9 Scores 11/30/2016 07/30/2015 10/21/2014  PHQ - 2 Score 0 0 0  PHQ- 9 Score 7 - -    Cognitive Function        Immunization History  Administered Date(s) Administered  . Influenza Split 03/30/2011, 05/09/2012  . Influenza Whole 04/22/2008, 03/31/2009  . Influenza, High Dose Seasonal PF 05/07/2013, 03/23/2016, 03/27/2017  . Influenza,inj,Quad PF,6+ Mos 04/07/2014, 04/29/2015  . Pneumococcal Conjugate-13 06/13/2013  . Pneumococcal Polysaccharide-23 10/27/2009  . Td 06/23/2010   Screening Tests Health Maintenance  Topic Date Due  . TETANUS/TDAP  06/23/2020  . INFLUENZA VACCINE  Completed  . PNA vac Low Risk Adult  Completed      Plan:    Continue doing brain stimulating activities (puzzles, reading, adult coloring books, staying active) to keep memory sharp.   Continue to eat heart healthy diet (full of fruits, vegetables, whole grains, lean protein, water--limit salt, fat, and sugar intake) and increase physical activity as tolerated.  I have personally reviewed and noted the following in the patient's chart:   . Medical and social history . Use of alcohol, tobacco or illicit drugs  . Current medications and  supplements . Functional ability and status . Nutritional status . Physical activity . Advanced directives . List of other physicians . Vitals . Screenings to include cognitive, depression, and falls . Referrals and appointments  In addition, I have reviewed and discussed with patient certain preventive protocols, quality metrics, and best practice recommendations. A written personalized care plan for preventive services as well as general preventive health recommendations were provided to patient.     Michiel Cowboy, RN  05/03/2017  Medical screening examination/treatment/procedure(s) were performed by non-physician practitioner and as supervising physician I was immediately available for consultation/collaboration. I  agree with above. [name], MD

## 2017-05-03 NOTE — Progress Notes (Signed)
Subjective:  Patient ID: [name], male    DOB: 04-29-1941  Age: 76 y.o. MRN: [id number]  CC: Medicare Wellness   HPI Aaron Bostwick [place]. presents for chronic pain f/u C/o R lat hip x 2 weeks C/o RLS - better on Rx F/u leg edema - better, resolved  Outpatient Medications Prior to Visit  Medication Sig Dispense Refill  . aspirin 81 MG EC tablet Take 81 mg by mouth daily.      . brinzolamide (AZOPT) 1 % ophthalmic suspension 1 drop 3 (three) times daily.    . Cholecalciferol 1000 UNITS tablet Take 1,000 Units by mouth daily.      . Cyanocobalamin (VITAMIN B-12) 1000 MCG SUBL DISSOLVE 2 TABLETS IN MOUTH EVERY DAY 60 tablet 8  . finasteride (PROSCAR) 5 MG tablet TAKE 1 TABLET (5 MG TOTAL) BY MOUTH DAILY. 90 tablet 1  . gabapentin (NEURONTIN) 300 MG capsule TAKE 1 CAPSULE (300 MG TOTAL) BY MOUTH AT BEDTIME. 30 capsule 1  . HYDROcodone-acetaminophen (NORCO) 7.5-325 MG tablet Take 1 tablet by mouth every 6 (six) hours as needed for severe pain. Please fill on or after 04/02/17 120 tablet 0  . latanoprost (XALATAN) 0.005 % ophthalmic solution Place 1 drop into both eyes daily.    . meclizine (ANTIVERT) 12.5 MG tablet Take 1 tablet (12.5 mg total) by mouth 3 (three) times daily as needed for dizziness. 60 tablet 1  . Multiple Vitamin (MULTIVITAMIN) capsule Take 1 capsule by mouth daily.      [name] rOPINIRole (REQUIP) 0.5 MG tablet Take 1 tablet (0.5 mg total) by mouth at bedtime. 30 tablet 5  . tamsulosin (FLOMAX) 0.4 MG CAPS capsule TAKE ONE CAPSULE EVERY DAY 90 capsule 1  . terazosin (HYTRIN) 2 MG capsule Take 1 capsule (2 mg total) by mouth at bedtime. 30 capsule 11  . furosemide (LASIX) 20 MG tablet Take 1-2 tablets (20-40 mg total) by mouth daily as needed. (Patient not taking: Reported on 05/03/2017) 60 tablet 3  . gabapentin (NEURONTIN) 300 MG capsule Take 1 capsule (300 mg total) by mouth at bedtime. (Patient not taking: Reported on 05/03/2017) 90 capsule 1  . ondansetron  (ZOFRAN) 4 MG tablet Take 1 tablet (4 mg total) by mouth every 8 (eight) hours as needed for nausea or vomiting. (Patient not taking: Reported on 05/03/2017) 20 tablet 0   No facility-administered medications prior to visit.     ROS Review of Systems  Constitutional: Positive for fatigue. Negative for appetite change and unexpected weight change.  HENT: Negative for congestion, nosebleeds, sneezing, sore throat and trouble swallowing.   Eyes: Negative for itching and visual disturbance.  Respiratory: Negative for cough.   Cardiovascular: Negative for chest pain, palpitations and leg swelling.  Gastrointestinal: Negative for abdominal distention, blood in stool, diarrhea and nausea.  Genitourinary: Negative for frequency and hematuria.  Musculoskeletal: Positive for arthralgias, back pain and gait problem. Negative for joint swelling and neck pain.  Skin: Negative for rash.  Neurological: Negative for dizziness, tremors, speech difficulty and weakness.  Psychiatric/Behavioral: Negative for agitation, dysphoric mood and sleep disturbance. The patient is not nervous/anxious.     Objective:  BP 132/78   Pulse (!) 53   Temp 97.6 F (36.4 C)   Resp 20   Ht 6' (1.829 m)   Wt 186 lb (84.4 kg)   SpO2 98%   BMI 25.23 kg/m   BP Readings from Last 3 Encounters:  05/03/17 132/78  01/31/17 132/80  01/12/17  140/82    Wt Readings from Last 3 Encounters:  05/03/17 186 lb (84.4 kg)  01/31/17 188 lb (85.3 kg)  01/12/17 188 lb (85.3 kg)    Physical Exam  Constitutional: He is oriented to person, place, and time. He appears well-developed. No distress.  NAD  HENT:  Mouth/Throat: Oropharynx is clear and moist.  Eyes: Pupils are equal, round, and reactive to light. Conjunctivae are normal.  Neck: Normal range of motion. No JVD present. No thyromegaly present.  Cardiovascular: Normal rate, regular rhythm, normal heart sounds and intact distal pulses.  Exam reveals no gallop and no friction  rub.   No murmur heard. Pulmonary/Chest: Effort normal and breath sounds normal. No respiratory distress. He has no wheezes. He has no rales. He exhibits no tenderness.  Abdominal: Soft. Bowel sounds are normal. He exhibits no distension and no mass. There is no tenderness. There is no rebound and no guarding.  Musculoskeletal: Normal range of motion. He exhibits tenderness. He exhibits no edema.  Lymphadenopathy:    He has no cervical adenopathy.  Neurological: He is alert and oriented to person, place, and time. He has normal reflexes. No cranial nerve deficit. He exhibits normal muscle tone. He displays a negative Romberg sign. Coordination and gait normal.  Skin: Skin is warm and dry. No rash noted.  Psychiatric: He has a normal mood and affect. His behavior is normal. Judgment and thought content normal.  L arm and wrist - tender, R lat hip - a little tender laterally LS tender Rectal - NE - recent colonoscopy  Lab Results  Component Value Date   WBC 6.6 01/12/2017   HGB 11.4 (L) 01/12/2017   HCT 32.9 (L) 01/12/2017   PLT 195.0 01/12/2017   GLUCOSE 86 01/12/2017   CHOL 136 01/12/2017   TRIG 67.0 01/12/2017   HDL 54.70 01/12/2017   LDLCALC 68 01/12/2017   ALT 13 01/12/2017   AST 20 01/12/2017   NA 140 01/12/2017   K 5.0 01/12/2017   CL 107 01/12/2017   CREATININE 1.12 01/12/2017   BUN 21 01/12/2017   CO2 26 01/12/2017   TSH 3.11 01/12/2017   PSA 0.47 08/30/2016   HGBA1C 5.6 07/28/2015    No results found.  Assessment & Plan:   There are no diagnoses linked to this encounter. I have discontinued Mr. Markin's ondansetron and furosemide. I am also having him maintain his aspirin, multivitamin, Cholecalciferol, latanoprost, meclizine, Vitamin B-12, brinzolamide, terazosin, HYDROcodone-acetaminophen, tamsulosin, gabapentin, finasteride, rOPINIRole, and Multiple Vitamins-Minerals (PRESERVISION AREDS PO).  Meds ordered this encounter  Medications  . Multiple  Vitamins-Minerals (PRESERVISION AREDS PO)    Sig: Take 2 tablets by mouth 2 (two) times daily.     Follow-up: No Follow-up on file.  [name], MD

## 2017-05-03 NOTE — Patient Instructions (Addendum)
Continue doing brain stimulating activities (puzzles, reading, adult coloring books, staying active) to keep memory sharp.   Continue to eat heart healthy diet (full of fruits, vegetables, whole grains, lean protein, water--limit salt, fat, and sugar intake) and increase physical activity as tolerated.  Hip Exercises Ask your health care provider which exercises are safe for you. Do exercises exactly as told by your health care provider and adjust them as directed. It is normal to feel mild stretching, pulling, tightness, or discomfort as you do these exercises, but you should stop right away if you feel sudden pain or your pain gets worse.Do not begin these exercises until told by your health care provider. STRETCHING AND RANGE OF MOTION EXERCISES These exercises warm up your muscles and joints and improve the movement and flexibility of your hip. These exercises also help to relieve pain, numbness, and tingling. Exercise A: Hamstrings, Supine  1. Lie on your back. 2. Loop a belt or towel over the ball of your left / rightfoot. The ball of your foot is on the walking surface, right under your toes. 3. Straighten your left / rightknee and slowly pull on the belt to raise your leg. ? Do not let your left / right knee bend while you do this. ? Keep your other leg flat on the floor. ? Raise the left / right leg until you feel a gentle stretch behind your left / right knee or thigh. 4. Hold this position for __________ seconds. 5. Slowly return your leg to the starting position. Repeat __________ times. Complete this stretch __________ times a day. Exercise B: Hip Rotators  1. Lie on your back on a firm surface. 2. Hold your left / right knee with your left / right hand. Hold your ankle with your other hand. 3. Gently pull your left / right knee and rotate your lower leg toward your other shoulder. ? Pull until you feel a stretch in your buttocks. ? Keep your hips and shoulders firmly planted  while you do this stretch. 4. Hold this position for __________ seconds. Repeat __________ times. Complete this stretch __________ times a day. Exercise C: V-Sit (Hamstrings and Adductors)  1. Sit on the floor with your legs extended in a large "V" shape. Keep your knees straight during this exercise. 2. Start with your head and chest upright, then bend at your waist to reach for your left foot (position A). You should feel a stretch in your right inner thigh. 3. Hold this position for __________ seconds. Then slowly return to the upright position. 4. Bend at your waist to reach forward (position B). You should feel a stretch behind both of your thighs and knees. 5. Hold this position for __________ seconds. Then slowly return to the upright position. 6. Bend at your waist to reach for your right foot (position C). You should feel a stretch in your left inner thigh. 7. Hold this position for __________ seconds. Then slowly return to the upright position. Repeat __________ times. Complete this stretch __________ times a day. Exercise D: Lunge (Hip Flexors)  1. Place your left / right knee on the floor and bend your other knee so that is directly over your ankle. You should be half-kneeling. 2. Keep good posture with your head over your shoulders. 3. Tighten your buttocks to point your tailbone downward. This helps your back to keep from arching too much. 4. You should feel a gentle stretch in the front of your left / right thigh and hip.  If you do not feel any resistance, slightly slide your other foot forward and then slowly lunge forward so your knee once again lines up over your ankle. 5. Make sure your tailbone continues to point downward. 6. Hold this position for __________ seconds. Repeat __________ times. Complete this stretch __________ times a day. STRENGTHENING EXERCISES These exercises build strength and endurance in your hip. Endurance is the ability to use your muscles for a long  time, even after they get tired. Exercise E: Bridge (Hip Extensors)  1. Lie on your back on a firm surface with your knees bent and your feet flat on the floor. 2. Tighten your buttocks muscles and lift your bottom off the floor until the trunk of your body is level with your thighs. ? Do not arch your back. ? You should feel the muscles working in your buttocks and the back of your thighs. If you do not feel these muscles, slide your feet 1-2 inches (2.5-5 cm) farther away from your buttocks. 3. Hold this position for __________ seconds. 4. Slowly lower your hips to the starting position. 5. Let your muscles relax completely between repetitions. 6. If this exercise is too easy, try doing it with your arms crossed over your chest. Repeat __________ times. Complete this exercise __________ times a day. Exercise F: Straight Leg Raises - Hip Abductors  1. Lie on your side with your left / right leg in the top position. Lie so your head, shoulder, knee, and hip line up with each other. You may bend your bottom knee to help you balance. 2. Roll your hips slightly forward, so your hips are stacked directly over each other and your left / right knee is facing forward. 3. Leading with your heel, lift your top leg 4-6 inches (10-15 cm). You should feel the muscles in your outer hip lifting. ? Do not let your foot drift forward. ? Do not let your knee roll toward the ceiling. 4. Hold this position for __________ seconds. 5. Slowly return to the starting position. 6. Let your muscles relax completely between repetitions. Repeat __________ times. Complete this exercise __________ times a day. Exercise G: Straight Leg Raises - Hip Adductors  1. Lie on your side with your left / right leg in the bottom position. Lie so your head, shoulder, knee, and hip line up. You may place your upper foot in front to help you balance. 2. Roll your hips slightly forward, so your hips are stacked directly over each  other and your left / right knee is facing forward. 3. Tense the muscles in your inner thigh and lift your bottom leg 4-6 inches (10-15 cm). 4. Hold this position for __________ seconds. 5. Slowly return to the starting position. 6. Let your muscles relax completely between repetitions. Repeat __________ times. Complete this exercise __________ times a day. Exercise H: Straight Leg Raises - Quadriceps  1. Lie on your back with your left / right leg extended and your other knee bent. 2. Tense the muscles in the front of your left / right thigh. When you do this, you should see your kneecap slide up or see increased dimpling just above your knee. 3. Tighten these muscles even more and raise your leg 4-6 inches (10-15 cm) off the floor. 4. Hold this position for __________ seconds. 5. Keep these muscles tense as you lower your leg. 6. Relax the muscles slowly and completely between repetitions. Repeat __________ times. Complete this exercise __________ times a day. Exercise I:  Hip Abductors, Standing 1. Tie one end of a rubber exercise band or tubing to a secure surface, such as a table or pole. 2. Loop the other end of the band or tubing around your left / right ankle. 3. Keeping your ankle with the band or tubing directly opposite of the secured end, step away until there is tension in the tubing or band. Hold onto a chair as needed for balance. 4. Lift your left / right leg out to your side. While you do this: ? Keep your back upright. ? Keep your shoulders over your hips. ? Keep your toes pointing forward. ? Make sure to use your hip muscles to lift your leg. Do not "throw" your leg or tip your body to lift your leg. 5. Hold this position for __________ seconds. 6. Slowly return to the starting position. Repeat __________ times. Complete this exercise __________ times a day. Exercise J: Squats (Quadriceps) 1. Stand in a door frame so your feet and knees are in line with the frame. You  may place your hands on the frame for balance. 2. Slowly bend your knees and lower your hips like you are going to sit in a chair. ? Keep your lower legs in a straight-up-and-down position. ? Do not let your hips go lower than your knees. ? Do not bend your knees lower than told by your health care provider. ? If your hip pain increases, do not bend as low. 3. Hold this position for ___________ seconds. 4. Slowly push with your legs to return to standing. Do not use your hands to pull yourself to standing. Repeat __________ times. Complete this exercise __________ times a day. This information is not intended to replace advice given to you by your health care provider. Make sure you discuss any questions you have with your health care provider. Document Released: 07/07/2005 Document Revised: 03/13/2016 Document Reviewed: 06/14/2015 Elsevier Interactive Patient Education  2018 [employer].   Justin [name] , Thank you for taking time to come for your Medicare Wellness Visit. I appreciate your ongoing commitment to your health goals. Please review the following plan we discussed and let me know if I can assist you in the future.   These are the goals we discussed: Goals      Patient Stated   . patient (pt-stated)          Wants to stay healthy;  Be with grand-children      Other   . Continue to be as active and as independent as possible       This is a list of the screening recommended for you and due dates:  Health Maintenance  Topic Date Due  . Tetanus Vaccine  06/23/2020  . Flu Shot  Completed  . Pneumonia vaccines  Completed

## 2017-05-08 ENCOUNTER — Ambulatory Visit: Payer: PPO | Admitting: Internal Medicine

## 2017-05-09 DIAGNOSIS — H353221 Exudative age-related macular degeneration, left eye, with active choroidal neovascularization: Secondary | ICD-10-CM | POA: Diagnosis not present

## 2017-05-09 DIAGNOSIS — H353121 Nonexudative age-related macular degeneration, left eye, early dry stage: Secondary | ICD-10-CM | POA: Diagnosis not present

## 2017-05-09 DIAGNOSIS — H353111 Nonexudative age-related macular degeneration, right eye, early dry stage: Secondary | ICD-10-CM | POA: Diagnosis not present

## 2017-05-09 DIAGNOSIS — H43392 Other vitreous opacities, left eye: Secondary | ICD-10-CM | POA: Diagnosis not present

## 2017-05-22 DIAGNOSIS — D2271 Melanocytic nevi of right lower limb, including hip: Secondary | ICD-10-CM | POA: Diagnosis not present

## 2017-05-22 DIAGNOSIS — D2371 Other benign neoplasm of skin of right lower limb, including hip: Secondary | ICD-10-CM | POA: Diagnosis not present

## 2017-05-22 DIAGNOSIS — D225 Melanocytic nevi of trunk: Secondary | ICD-10-CM | POA: Diagnosis not present

## 2017-05-22 DIAGNOSIS — L570 Actinic keratosis: Secondary | ICD-10-CM | POA: Diagnosis not present

## 2017-05-22 DIAGNOSIS — Z85828 Personal history of other malignant neoplasm of skin: Secondary | ICD-10-CM | POA: Diagnosis not present

## 2017-05-22 DIAGNOSIS — D1801 Hemangioma of skin and subcutaneous tissue: Secondary | ICD-10-CM | POA: Diagnosis not present

## 2017-05-22 DIAGNOSIS — C44319 Basal cell carcinoma of skin of other parts of face: Secondary | ICD-10-CM | POA: Diagnosis not present

## 2017-06-15 ENCOUNTER — Telehealth: Payer: Self-pay | Admitting: Internal Medicine

## 2017-06-15 MED ORDER — ROPINIROLE HCL 0.5 MG PO TABS: 1.0000 mg | ORAL_TABLET | Freq: Every day | ORAL | 5 refills | Status: DC

## 2017-06-15 NOTE — Telephone Encounter (Signed)
MD is out of office pls advise in his abscence...Johny Chess

## 2017-06-15 NOTE — Telephone Encounter (Signed)
Copied from [place]. Topic: Quick Communication - See Telephone Encounter >> Jun 15, 2017  8:40 AM Ether Griffins B wrote: CRM for notification. See Telephone encounter for:  Pt taking ropinirole 0.5 mg. And it is no longer working for him. It doesn't give him relief at all anymore. Could he increase the dose or have something different called in.  06/15/17.

## 2017-06-15 NOTE — Telephone Encounter (Signed)
Notified pt w/MD response.../lmb 

## 2017-06-15 NOTE — Telephone Encounter (Signed)
Have him try taking two tabs at night to see how her does.  He should let pcp know if it is not effective.

## 2017-06-20 DIAGNOSIS — H353121 Nonexudative age-related macular degeneration, left eye, early dry stage: Secondary | ICD-10-CM | POA: Diagnosis not present

## 2017-06-20 DIAGNOSIS — H353221 Exudative age-related macular degeneration, left eye, with active choroidal neovascularization: Secondary | ICD-10-CM | POA: Diagnosis not present

## 2017-06-20 DIAGNOSIS — H43392 Other vitreous opacities, left eye: Secondary | ICD-10-CM | POA: Diagnosis not present

## 2017-06-20 DIAGNOSIS — H353111 Nonexudative age-related macular degeneration, right eye, early dry stage: Secondary | ICD-10-CM | POA: Diagnosis not present

## 2017-06-21 ENCOUNTER — Telehealth: Payer: Self-pay | Admitting: Internal Medicine

## 2017-06-21 MED ORDER — ROPINIROLE HCL 0.5 MG PO TABS: 1.0000 mg | ORAL_TABLET | Freq: Every day | ORAL | 5 refills | Status: DC

## 2017-06-21 NOTE — Telephone Encounter (Signed)
Pt called to say that he went to pick up Requip and it was not there. Looks like it ordered 06/15/17 60 tabs with 5 refills. Reordered and sent to [facility]

## 2017-06-22 ENCOUNTER — Ambulatory Visit: Payer: PPO | Admitting: Podiatry

## 2017-06-22 ENCOUNTER — Ambulatory Visit (INDEPENDENT_AMBULATORY_CARE_PROVIDER_SITE_OTHER): Payer: PPO

## 2017-06-22 ENCOUNTER — Encounter: Payer: Self-pay | Admitting: Podiatry

## 2017-06-22 DIAGNOSIS — M779 Enthesopathy, unspecified: Secondary | ICD-10-CM | POA: Diagnosis not present

## 2017-06-22 DIAGNOSIS — S93402A Sprain of unspecified ligament of left ankle, initial encounter: Secondary | ICD-10-CM

## 2017-06-22 DIAGNOSIS — M76829 Posterior tibial tendinitis, unspecified leg: Secondary | ICD-10-CM

## 2017-06-22 MED ORDER — METHYLPREDNISOLONE 4 MG PO TBPK: ORAL_TABLET | ORAL | 0 refills | Status: DC

## 2017-06-23 NOTE — Progress Notes (Signed)
Subjective:   Patient ID: Justin Pagan Su Grand., male   DOB: 76 y.o.   MRN: [id number]   Justin [name] presents the office today for concerns of left ankle pain which is been ongoing for the last couple of months.  He states that at times the area onto the inside aspect ankle is very painful and he cannot walk he feels like his ankles give out at times.  He also notices some swelling to the left foot.  Denies any recent injury or trauma.  Denies any swelling or redness.  He does have neuropathy and has been taking some medication for this but is not helping the ankle pain.  The pain is intermittent.  He has no other concerns today.   Review of Systems  All other systems reviewed and are negative.  Past Medical History:  Diagnosis Date  . Alcoholism ([place])    Dry 13 years  . Basal cell carcinoma    Right Chest  . Chronic insomnia    On ambien x 2 years  . Depression   . Diverticulosis of colon   . Dizzy spells   . Elbow fracture, left 2009   [name]  . Glaucoma   . History of TIAs   . Hyperkeratosis   . LBP (low back pain)   . Melanoma [facility]) 2009   x3 [name]  . Osteoarthritis   . Restless leg syndrome   . Tinnitus   . Tobacco abuse     Past Surgical History:  Procedure Laterality Date  . CATARACT EXTRACTION    . MELANOMA EXCISION  2009   x3  . RECONSTRUCTION MEDIAL COLLATERAL LIGAMENT ELBOW W/ TENDON GRAFT  2009   Left, [name]     Current Outpatient Medications:  .  aspirin 81 MG EC tablet, Take 81 mg by mouth daily.  , Disp: , Rfl:  .  brinzolamide (AZOPT) 1 % ophthalmic suspension, 1 drop 3 (three) times daily., Disp: , Rfl:  .  Cholecalciferol 1000 UNITS tablet, Take 1,000 Units by mouth daily.  , Disp: , Rfl:  .  Cyanocobalamin (VITAMIN B-12) 1000 MCG SUBL, DISSOLVE 2 TABLETS IN MOUTH EVERY DAY, Disp: 60 tablet, Rfl: 8 .  finasteride (PROSCAR) 5 MG tablet, TAKE 1 TABLET (5 MG TOTAL) BY MOUTH DAILY., Disp: 90 tablet, Rfl: 1 .  gabapentin (NEURONTIN) 300 MG  capsule, TAKE 1 CAPSULE (300 MG TOTAL) BY MOUTH AT BEDTIME., Disp: 30 capsule, Rfl: 1 .  HYDROcodone-acetaminophen (NORCO) 7.5-325 MG tablet, Take 1 tablet by mouth every 6 (six) hours as needed for severe pain. Please fill on or after 07/03/17, Disp: 120 tablet, Rfl: 0 .  latanoprost (XALATAN) 0.005 % ophthalmic solution, Place 1 drop into both eyes daily., Disp: , Rfl:  .  meclizine (ANTIVERT) 12.5 MG tablet, Take 1 tablet (12.5 mg total) by mouth 3 (three) times daily as needed for dizziness., Disp: 60 tablet, Rfl: 1 .  methylPREDNISolone (MEDROL DOSEPAK) 4 MG TBPK tablet, Take as directed, Disp: 21 tablet, Rfl: 0 .  Multiple Vitamin (MULTIVITAMIN) capsule, Take 1 capsule by mouth daily.  , Disp: , Rfl:  .  Multiple Vitamins-Minerals (PRESERVISION AREDS PO), Take 2 tablets by mouth 2 (two) times daily., Disp: , Rfl:  .  rOPINIRole (REQUIP) 0.5 MG tablet, Take 2 tablets (1 mg total) by mouth at bedtime., Disp: 60 tablet, Rfl: 5 .  tamsulosin (FLOMAX) 0.4 MG CAPS capsule, TAKE ONE CAPSULE EVERY DAY, Disp: 90 capsule, Rfl: 1 .  terazosin (HYTRIN) 2 MG capsule, Take 1 capsule (2 mg total) by mouth at bedtime., Disp: 30 capsule, Rfl: 11  Allergies  Allergen Reactions  . Levofloxacin     REACTION: hands pealed    Social History   Socioeconomic History  . Marital status: Married    Spouse name: Not on file  . Number of children: 2  . Years of education: Not on file  . Highest education level: Not on file  Social Needs  . Financial resource strain: Not on file  . Food insecurity - worry: Not on file  . Food insecurity - inability: Not on file  . Transportation needs - medical: Not on file  . Transportation needs - non-medical: Not on file  Occupational History  . Occupation: [place] [employer]    Employer: RETIRED  . Occupation: Chiropodist  . Occupation: Heritage manager  Tobacco Use  . Smoking status: Former Smoker    Packs/day: 3.00    Years: 38.00    Pack  years: 114.00    Types: Cigarettes    Last attempt to quit: 08/30/1990    Years since quitting: 26.8  . Smokeless tobacco: Never Used  . Tobacco comment: states he quit 1986  Substance and Sexual Activity  . Alcohol use: No    Alcohol/week: 0.0 oz    Comment: PREVIOUS - DRY 13 yrs  . Drug use: Not on file  . Sexual activity: Not Currently  Other Topics Concern  . Not on file  Social History Narrative  . Not on file       Objective:  Physical Exam  General: AAO x3, NAD  Dermatological: Skin is warm, dry and supple bilateral. Nails x 10 are well manicured; remaining integument appears unremarkable at this time. There are no open sores, no preulcerative lesions, no rash or signs of infection present.  Vascular: Dorsalis Pedis artery and Posterior Tibial artery pedal pulses are 2/4 bilateral with immedate capillary fill time. Pedal hair growth present.  Mild varicosities  present bilateral. There is no pain with calf compression, swelling, warmth, erythema.   Neruologic: Grossly intact via light touch bilateral.Protective threshold with Semmes Wienstein monofilament intact to all pedal sites bilateral.   Musculoskeletal: There is mild edema to the left ankle.  There is no area of tenderness identified to the ankle joint along the lateral ankle ligaments.  Achilles tendon, plantar fascia appear to be intact.  There is discomfort along the navicular tuberosity on the insertion of the posterior tibial tendon.  There is minimal discomfort on the course the posterior tibial tendon.  He is able to do a double heel rise but he could not perform a single heel rise on the left side.  The tendon does appear to be intact.  There is no area pinpoint bony tenderness pain to vibratory sensation.  There is no pain to the foot.  Muscular strength 5/5 in all groups tested bilateral.  Gait: Unassisted, Nonantalgic.      Assessment:   76 year old male with posterior tibial tendon dysfunction,  tendinitis    Plan:  -Treatment options discussed including all alternatives, risks, and complications -Etiology of symptoms were discussed -X-rays were obtained and reviewed with the patient. No evidence of acute fracture identified -Medrol dose pack -Trilock ankle brace -Ice -He is wearing his more flexible shoe and I discussed the change of shoes and possibly insert and set of issues. -Once pain improves will start physical therapy hopefully.  We will likely do this  on his own at home due to [place] 3 weeks or sooner if needed. Agrees to this plan and has no further questions or concerns  Trula Slade DPM

## 2017-06-28 ENCOUNTER — Other Ambulatory Visit: Payer: Self-pay | Admitting: Podiatry

## 2017-06-28 NOTE — Telephone Encounter (Signed)
Dr. Jacqualyn Posey prescribed me a prednisone medrol Dosepak that goes 6, 5, 4, 3, 2, 1. After the first day of 6 it doesn't work to good and I was wondering if he could give me a certain amount each day until I see him again. Maybe more than 3, 2, 1, maybe 5 or 6 of those tablets for the next week until I see him a week from Friday. The number is [phone]. Thank you.

## 2017-06-28 NOTE — Addendum Note (Signed)
Addended by: Harriett Sine D on: 06/28/2017 03:04 PM   Modules accepted: Orders

## 2017-06-28 NOTE — Telephone Encounter (Signed)
Can you order a 10 day steroid pack please? Thanks.

## 2017-06-29 ENCOUNTER — Other Ambulatory Visit: Payer: Self-pay | Admitting: Podiatry

## 2017-06-29 MED ORDER — PREDNISONE 10 MG PO TABS: ORAL_TABLET | ORAL | 0 refills | Status: DC

## 2017-06-29 NOTE — Telephone Encounter (Signed)
Sent it to his pharmacy

## 2017-06-29 NOTE — Telephone Encounter (Signed)
Dr. Jacqualyn Posey answered in Rx Response to send the Deltasone 10 mg to pt's pharmacy.

## 2017-06-29 NOTE — Telephone Encounter (Signed)
I informed pt [facility] [address]

## 2017-06-29 NOTE — Addendum Note (Signed)
Addended by: Harriett Sine D on: 06/29/2017 04:19 PM   Modules accepted: Orders

## 2017-07-06 ENCOUNTER — Encounter: Payer: Self-pay | Admitting: Podiatry

## 2017-07-06 ENCOUNTER — Encounter: Payer: PPO | Admitting: Podiatry

## 2017-07-09 ENCOUNTER — Telehealth: Payer: Self-pay | Admitting: Internal Medicine

## 2017-07-09 MED ORDER — ROPINIROLE HCL 2 MG PO TABS: 2.0000 mg | ORAL_TABLET | Freq: Every day | ORAL | 5 refills | Status: DC

## 2017-07-09 NOTE — Telephone Encounter (Signed)
Please advise 

## 2017-07-09 NOTE — Progress Notes (Signed)
Cancelled  He came in with his wife for her appointment and he is doing much better he is still on steroids.  Because of this I will see him in 2 weeks once he comes off the steroids to see how he does.  Continue the ankle brace discussed with him.

## 2017-07-09 NOTE — Telephone Encounter (Signed)
Pt.notified

## 2017-07-09 NOTE — Telephone Encounter (Signed)
[facility] - new Rx email: use 2-4 mg at hs Thx

## 2017-07-09 NOTE — Telephone Encounter (Signed)
Copied from [place]. Topic: Quick Communication - See Telephone Encounter >> Jul 09, 2017 10:39 AM Conception Chancy, NT wrote: CRM for notification. See Telephone encounter for:  07/09/17.  Patient is calling and stating he is needing a higher dose for Ropinirole. He states taking 2 at bedtime is not working for him, he can not even take a nap in the evening time because the pain is so bad. Please advise and contact patient if he will need an appt for a higher dose.

## 2017-07-19 ENCOUNTER — Ambulatory Visit: Payer: PPO | Admitting: Podiatry

## 2017-07-20 ENCOUNTER — Telehealth: Payer: Self-pay

## 2017-07-20 DIAGNOSIS — D509 Iron deficiency anemia, unspecified: Secondary | ICD-10-CM

## 2017-07-20 NOTE — Telephone Encounter (Signed)
Please advise about labs  Copied from [place] [id number]. Topic: Appointment Scheduling - Scheduling Inquiry for Clinic >> Jul 20, 2017  1:29 PM Arletha Grippe wrote: Reason for CRM: pt feels very tired and would like blood work for anemia.  Please call pt with if approved and when he can come in for bloodwork. He has appt on 07/31/17 and would like ot have results for that appt. I offered to make appt with a different provider, but pt declined.  Cb # is [phone]

## 2017-07-22 NOTE — Telephone Encounter (Signed)
Please order iron/TIBC and CBC.  Diagnosis: Anemia.  Thank you

## 2017-07-25 NOTE — Telephone Encounter (Signed)
Notified pt w/MD response. Place labs in system.Marland KitchenJohny [name]

## 2017-07-26 ENCOUNTER — Other Ambulatory Visit (INDEPENDENT_AMBULATORY_CARE_PROVIDER_SITE_OTHER): Payer: PPO

## 2017-07-26 DIAGNOSIS — D509 Iron deficiency anemia, unspecified: Secondary | ICD-10-CM

## 2017-07-26 LAB — CBC WITH DIFFERENTIAL/PLATELET
Basophils Absolute: 0 10*3/uL (ref 0.0–0.1)
Basophils Relative: 0.4 % (ref 0.0–3.0)
Eosinophils Absolute: 0.6 10*3/uL (ref 0.0–0.7)
Eosinophils Relative: 8.9 % — ABNORMAL HIGH (ref 0.0–5.0)
HCT: 33.2 % — ABNORMAL LOW (ref 39.0–52.0)
Hemoglobin: 11.3 g/dL — ABNORMAL LOW (ref 13.0–17.0)
Lymphocytes Relative: 20.8 % (ref 12.0–46.0)
Lymphs Abs: 1.3 10*3/uL (ref 0.7–4.0)
MCHC: 34 g/dL (ref 30.0–36.0)
MCV: 89.9 fl (ref 78.0–100.0)
Monocytes Absolute: 0.7 10*3/uL (ref 0.1–1.0)
Monocytes Relative: 11.8 % (ref 3.0–12.0)
Neutro Abs: 3.6 10*3/uL (ref 1.4–7.7)
Neutrophils Relative %: 58.1 % (ref 43.0–77.0)
Platelets: 239 10*3/uL (ref 150.0–400.0)
RBC: 3.69 Mil/uL — ABNORMAL LOW (ref 4.22–5.81)
RDW: 13.5 % (ref 11.5–15.5)
WBC: 6.2 10*3/uL (ref 4.0–10.5)

## 2017-07-26 NOTE — Addendum Note (Signed)
Addended by: Trenda Moots on: 3/38/3291 11:07 AM   Modules accepted: Orders

## 2017-07-27 LAB — IRON AND TIBC
Iron Saturation: 17 % (ref 15–55)
Iron: 46 ug/dL (ref 38–169)
Total Iron Binding Capacity: 273 ug/dL (ref 250–450)
UIBC: 227 ug/dL (ref 111–343)

## 2017-07-31 ENCOUNTER — Encounter: Payer: Self-pay | Admitting: Internal Medicine

## 2017-07-31 ENCOUNTER — Ambulatory Visit (INDEPENDENT_AMBULATORY_CARE_PROVIDER_SITE_OTHER): Payer: PPO | Admitting: Internal Medicine

## 2017-07-31 ENCOUNTER — Other Ambulatory Visit (INDEPENDENT_AMBULATORY_CARE_PROVIDER_SITE_OTHER): Payer: PPO

## 2017-07-31 VITALS — BP 124/66 | HR 74 | Temp 97.5°F | Ht 72.0 in | Wt 191.0 lb

## 2017-07-31 DIAGNOSIS — R5383 Other fatigue: Secondary | ICD-10-CM

## 2017-07-31 DIAGNOSIS — E538 Deficiency of other specified B group vitamins: Secondary | ICD-10-CM | POA: Diagnosis not present

## 2017-07-31 DIAGNOSIS — R0602 Shortness of breath: Secondary | ICD-10-CM | POA: Diagnosis not present

## 2017-07-31 DIAGNOSIS — R0600 Dyspnea, unspecified: Secondary | ICD-10-CM

## 2017-07-31 DIAGNOSIS — R0609 Other forms of dyspnea: Secondary | ICD-10-CM | POA: Diagnosis not present

## 2017-07-31 DIAGNOSIS — R079 Chest pain, unspecified: Secondary | ICD-10-CM

## 2017-07-31 DIAGNOSIS — M5440 Lumbago with sciatica, unspecified side: Secondary | ICD-10-CM

## 2017-07-31 LAB — BASIC METABOLIC PANEL
BUN: 22 mg/dL (ref 6–23)
CO2: 26 mEq/L (ref 19–32)
Calcium: 9.1 mg/dL (ref 8.4–10.5)
Chloride: 107 mEq/L (ref 96–112)
Creatinine, Ser: 1.16 mg/dL (ref 0.40–1.50)
GFR: 64.68 mL/min (ref >60.00)
Glucose, Bld: 103 mg/dL — ABNORMAL HIGH (ref 70–99)
Potassium: 4.3 mEq/L (ref 3.5–5.1)
Sodium: 140 mEq/L (ref 135–145)

## 2017-07-31 LAB — URINALYSIS
Bilirubin Urine: NEGATIVE
Hgb urine dipstick: NEGATIVE
Ketones, ur: NEGATIVE
Leukocytes, UA: NEGATIVE
Nitrite: NEGATIVE
Specific Gravity, Urine: 1.02 (ref 1.000–1.030)
Total Protein, Urine: NEGATIVE
Urine Glucose: NEGATIVE
Urobilinogen, UA: 0.2 (ref 0.0–1.0)
pH: 6 (ref 5.0–8.0)

## 2017-07-31 LAB — HEPATIC FUNCTION PANEL
ALT: 11 U/L (ref 0–53)
AST: 17 U/L (ref 0–37)
Albumin: 3.8 g/dL (ref 3.5–5.2)
Alkaline Phosphatase: 55 U/L (ref 39–117)
Bilirubin, Direct: 0.1 mg/dL (ref 0.0–0.3)
Total Bilirubin: 0.5 mg/dL (ref 0.2–1.2)
Total Protein: 6.7 g/dL (ref 6.0–8.3)

## 2017-07-31 LAB — TSH: TSH: 2.83 u[IU]/mL (ref 0.35–4.50)

## 2017-07-31 MED ORDER — HYDROCODONE-ACETAMINOPHEN 7.5-325 MG PO TABS: 1.0000 | ORAL_TABLET | Freq: Four times a day (QID) | ORAL | 0 refills | Status: DC | PRN

## 2017-07-31 NOTE — Assessment & Plan Note (Signed)
On B12 

## 2017-07-31 NOTE — Assessment & Plan Note (Addendum)
We need to rule out coronary artery disease, pulmonary embolism, malignancy and other potential serious issues.  We will obtain blood work today.  Will obtain CL stress test, CHEST ANGIO CT. he was instructed to go to the emergency room if worse. EKG OK

## 2017-07-31 NOTE — Assessment & Plan Note (Signed)
On Norco  Potential benefits of a long term opioids use as well as potential risks (i.e. addiction risk, apnea etc) and complications (i.e. Somnolence, constipation and others) were explained to the patient and were aknowledged.  

## 2017-07-31 NOTE — Patient Instructions (Signed)
Go to ER if worse 

## 2017-07-31 NOTE — Assessment & Plan Note (Signed)
CL stress test CHEST ANGIO CT EKG OK Labs OK

## 2017-07-31 NOTE — Progress Notes (Signed)
Subjective:  Patient ID: Justin Pagan Su Grand., male    DOB: [birth year]  Age: 78 y.o. MRN: [id number]  CC: No chief complaint on file.   HPI Justin [name]. presents for fatigue x 2 mo, SOB on exertion.  Chest pressure is bothersome at times; on occasion he feels something is caught in his throat.  He does not think that any of his medicines are causing fatigue.  He describes his fatigue as severe at times.  There has been no improvement in his symptoms.  It is a complex case. F/u chronic pain, BPH, RLS  Outpatient Medications Prior to Visit  Medication Sig Dispense Refill  . aspirin 81 MG EC tablet Take 81 mg by mouth daily.      . brinzolamide (AZOPT) 1 % ophthalmic suspension 1 drop 3 (three) times daily.    . Cholecalciferol 1000 UNITS tablet Take 1,000 Units by mouth daily.      . Cyanocobalamin (VITAMIN B-12) 1000 MCG SUBL DISSOLVE 2 TABLETS IN MOUTH EVERY DAY 60 tablet 8  . finasteride (PROSCAR) 5 MG tablet TAKE 1 TABLET (5 MG TOTAL) BY MOUTH DAILY. 90 tablet 1  . gabapentin (NEURONTIN) 300 MG capsule TAKE 1 CAPSULE (300 MG TOTAL) BY MOUTH AT BEDTIME. 30 capsule 1  . HYDROcodone-acetaminophen (NORCO) 7.5-325 MG tablet Take 1 tablet by mouth every 6 (six) hours as needed for severe pain. Please fill on or after 07/03/17 120 tablet 0  . latanoprost (XALATAN) 0.005 % ophthalmic solution Place 1 drop into both eyes daily.    . meclizine (ANTIVERT) 12.5 MG tablet Take 1 tablet (12.5 mg total) by mouth 3 (three) times daily as needed for dizziness. 60 tablet 1  . Multiple Vitamin (MULTIVITAMIN) capsule Take 1 capsule by mouth daily.      . Multiple Vitamins-Minerals (PRESERVISION AREDS PO) Take 2 tablets by mouth 2 (two) times daily.    [name] rOPINIRole (REQUIP) 2 MG tablet Take 1-2 tablets (2-4 mg total) by mouth at bedtime. 60 tablet 5  . tamsulosin (FLOMAX) 0.4 MG CAPS capsule TAKE ONE CAPSULE EVERY DAY 90 capsule 1  . terazosin (HYTRIN) 2 MG capsule Take 1 capsule (2 mg total) by  mouth at bedtime. 30 capsule 11  . methylPREDNISolone (MEDROL DOSEPAK) 4 MG TBPK tablet Take as directed 21 tablet 0  . predniSONE (DELTASONE) 10 MG tablet 12 day taper dose 48 tablet 0  . predniSONE (DELTASONE) 10 MG tablet Take as directed 48 tablet 0   No facility-administered medications prior to visit.     ROS Review of Systems  Constitutional: Negative for appetite change and unexpected weight change.  HENT: Negative for congestion, nosebleeds, sneezing, sore throat and trouble swallowing.   Eyes: Negative for itching and visual disturbance.  Respiratory: Positive for chest tightness and shortness of breath. Negative for cough.   Cardiovascular: Negative for chest pain, palpitations and leg swelling.  Gastrointestinal: Negative for abdominal distention, blood in stool, diarrhea and nausea.  Genitourinary: Negative for frequency and hematuria.  Musculoskeletal: Negative for back pain, gait problem, joint swelling and neck pain.  Skin: Negative for rash.  Neurological: Positive for weakness. Negative for dizziness, tremors and speech difficulty.  Psychiatric/Behavioral: Negative for agitation, dysphoric mood and sleep disturbance. The patient is not nervous/anxious.     Objective:  BP 124/66 (BP Location: Right Arm, Patient Position: Sitting, Cuff Size: Large)   Pulse 74   Temp (!) 97.5 F (36.4 C) (Oral)   Ht 6' (1.829 m)  Wt 191 lb (86.6 kg)   SpO2 96%   BMI 25.90 kg/m   BP Readings from Last 3 Encounters:  07/31/17 124/66  05/03/17 132/78  01/31/17 132/80    Wt Readings from Last 3 Encounters:  07/31/17 191 lb (86.6 kg)  05/03/17 186 lb (84.4 kg)  01/31/17 188 lb (85.3 kg)    Physical Exam  Constitutional: He is oriented to person, place, and time. He appears well-developed. No distress.  NAD  HENT:  Mouth/Throat: Oropharynx is clear and moist.  Eyes: Conjunctivae are normal. Pupils are equal, round, and reactive to light.  Neck: Normal range of motion. No  JVD present. No thyromegaly present.  Cardiovascular: Normal rate, regular rhythm, normal heart sounds and intact distal pulses. Exam reveals no gallop and no friction rub.  No murmur heard. Pulmonary/Chest: Effort normal and breath sounds normal. No respiratory distress. He has no wheezes. He has no rales. He exhibits no tenderness.  Abdominal: Soft. Bowel sounds are normal. He exhibits no distension and no mass. There is no tenderness. There is no rebound and no guarding.  Musculoskeletal: Normal range of motion. He exhibits tenderness. He exhibits no edema.  Lymphadenopathy:    He has no cervical adenopathy.  Neurological: He is alert and oriented to person, place, and time. He has normal reflexes. No cranial nerve deficit. He exhibits normal muscle tone. He displays a negative Romberg sign. Coordination and gait normal.  Skin: Skin is warm and dry. No rash noted.  Psychiatric: He has a normal mood and affect. His behavior is normal. Judgment and thought content normal.  LS tender   Procedure: EKG Indication: chest pain Impression: NSR. No acute changes.   Lab Results  Component Value Date   WBC 6.2 07/26/2017   HGB 11.3 (L) 07/26/2017   HCT 33.2 (L) 07/26/2017   PLT 239.0 07/26/2017   GLUCOSE 86 01/12/2017   CHOL 136 01/12/2017   TRIG 67.0 01/12/2017   HDL 54.70 01/12/2017   LDLCALC 68 01/12/2017   ALT 13 01/12/2017   AST 20 01/12/2017   NA 140 01/12/2017   K 5.0 01/12/2017   CL 107 01/12/2017   CREATININE 1.12 01/12/2017   BUN 21 01/12/2017   CO2 26 01/12/2017   TSH 3.11 01/12/2017   PSA 0.47 08/30/2016   HGBA1C 5.6 07/28/2015    No results found.  Assessment & Plan:   There are no diagnoses linked to this encounter. I have discontinued Justin Baltimore Jr.'s methylPREDNISolone, predniSONE, and predniSONE. I am also having him maintain his aspirin, multivitamin, Cholecalciferol, latanoprost, meclizine, Vitamin B-12, brinzolamide, terazosin, tamsulosin,  gabapentin, finasteride, Multiple Vitamins-Minerals (PRESERVISION AREDS PO), HYDROcodone-acetaminophen, and rOPINIRole.  No orders of the defined types were placed in this encounter.    Follow-up: No Follow-up on file.  [name], MD

## 2017-08-01 DIAGNOSIS — H43812 Vitreous degeneration, left eye: Secondary | ICD-10-CM | POA: Diagnosis not present

## 2017-08-01 DIAGNOSIS — H401132 Primary open-angle glaucoma, bilateral, moderate stage: Secondary | ICD-10-CM | POA: Diagnosis not present

## 2017-08-01 DIAGNOSIS — H35012 Changes in retinal vascular appearance, left eye: Secondary | ICD-10-CM | POA: Diagnosis not present

## 2017-08-01 DIAGNOSIS — H353221 Exudative age-related macular degeneration, left eye, with active choroidal neovascularization: Secondary | ICD-10-CM | POA: Diagnosis not present

## 2017-08-02 ENCOUNTER — Ambulatory Visit (INDEPENDENT_AMBULATORY_CARE_PROVIDER_SITE_OTHER)
Admission: RE | Admit: 2017-08-02 | Discharge: 2017-08-02 | Disposition: A | Discharge status: Home / Self Care | Payer: PPO | Source: Ambulatory Visit | Attending: Internal Medicine | Admitting: Internal Medicine

## 2017-08-02 ENCOUNTER — Other Ambulatory Visit: Payer: Self-pay | Admitting: Internal Medicine

## 2017-08-02 DIAGNOSIS — R0609 Other forms of dyspnea: Secondary | ICD-10-CM

## 2017-08-02 DIAGNOSIS — R079 Chest pain, unspecified: Secondary | ICD-10-CM | POA: Diagnosis not present

## 2017-08-02 DIAGNOSIS — R0602 Shortness of breath: Secondary | ICD-10-CM | POA: Diagnosis not present

## 2017-08-02 DIAGNOSIS — R5383 Other fatigue: Secondary | ICD-10-CM | POA: Diagnosis not present

## 2017-08-02 DIAGNOSIS — I700 Atherosclerosis of aorta: Secondary | ICD-10-CM | POA: Diagnosis not present

## 2017-08-02 DIAGNOSIS — R0600 Dyspnea, unspecified: Secondary | ICD-10-CM

## 2017-08-02 MED ORDER — IOPAMIDOL (ISOVUE-370) INJECTION 76%
80.0000 mL | Freq: Once | INTRAVENOUS | Status: AC | PRN
  Administered 2017-08-02: 80 mL via INTRAVENOUS

## 2017-08-06 ENCOUNTER — Telehealth (HOSPITAL_COMMUNITY): Payer: Self-pay | Admitting: *Deleted

## 2017-08-06 NOTE — Telephone Encounter (Signed)
Patient given detailed instructions per Myocardial Perfusion Study Information Sheet for the test on 08/08/17 at 0745. Patient notified to arrive 15 minutes early and that it is imperative to arrive on time for appointment to keep from having the test rescheduled.  If you need to cancel or reschedule your appointment, please call the office within 24 hours of your appointment. . Patient verbalized understanding.Jhada Risk W    

## 2017-08-08 ENCOUNTER — Ambulatory Visit (HOSPITAL_COMMUNITY): Payer: PPO | Attending: Cardiology

## 2017-08-08 DIAGNOSIS — R0602 Shortness of breath: Secondary | ICD-10-CM

## 2017-08-08 DIAGNOSIS — R079 Chest pain, unspecified: Secondary | ICD-10-CM | POA: Diagnosis not present

## 2017-08-08 DIAGNOSIS — R5383 Other fatigue: Secondary | ICD-10-CM

## 2017-08-08 DIAGNOSIS — I2510 Atherosclerotic heart disease of native coronary artery without angina pectoris: Secondary | ICD-10-CM | POA: Insufficient documentation

## 2017-08-08 DIAGNOSIS — R0609 Other forms of dyspnea: Secondary | ICD-10-CM

## 2017-08-08 DIAGNOSIS — R9439 Abnormal result of other cardiovascular function study: Secondary | ICD-10-CM | POA: Diagnosis not present

## 2017-08-08 DIAGNOSIS — R0600 Dyspnea, unspecified: Secondary | ICD-10-CM

## 2017-08-08 LAB — MYOCARDIAL PERFUSION IMAGING
LV dias vol: 113 mL (ref 62–150)
LV sys vol: 45 mL
Peak HR: 96 {beats}/min
RATE: 0.37
Rest HR: 69 {beats}/min
SDS: 3
SRS: 3
SSS: 6
TID: 0.87

## 2017-08-08 MED ORDER — TECHNETIUM TC 99M TETROFOSMIN IV KIT
10.7000 | PACK | Freq: Once | INTRAVENOUS | Status: AC | PRN
  Administered 2017-08-08: 10.7 via INTRAVENOUS
  Filled 2017-08-08: qty 11

## 2017-08-08 MED ORDER — TECHNETIUM TC 99M TETROFOSMIN IV KIT
31.8000 | PACK | Freq: Once | INTRAVENOUS | Status: AC | PRN
  Administered 2017-08-08: 31.8 via INTRAVENOUS
  Filled 2017-08-08: qty 32

## 2017-08-08 MED ORDER — REGADENOSON 0.4 MG/5ML IV SOLN
0.4000 mg | Freq: Once | INTRAVENOUS | Status: AC
  Administered 2017-08-08: 0.4 mg via INTRAVENOUS

## 2017-08-09 ENCOUNTER — Telehealth: Payer: Self-pay | Admitting: Internal Medicine

## 2017-08-09 NOTE — Telephone Encounter (Signed)
Copied from [place] [id number]. Topic: Quick Communication - See Telephone Encounter >> Aug 09, 2017  2:13 PM Aurelio Brash B wrote: CRM for notification. See Telephone encounter for:  Pt called for results of stress test 08/09/17.

## 2017-08-13 ENCOUNTER — Telehealth: Payer: Self-pay | Admitting: Internal Medicine

## 2017-08-13 NOTE — Telephone Encounter (Signed)
Copied from [place]. Topic: Inquiry >> Aug 13, 2017  2:42 PM [place], [name], [place] wrote: Reason for CRM: Patient calling because he would like to let Dr.Plotnikov know that his restless leg syndrome is getting worse and he would like to know what he can do about it or is there someothing that he can take for it. If him or his nurse could give him a call back about this at [phone]    Patient has an appointment for a FU on 2/26.

## 2017-08-13 NOTE — Telephone Encounter (Addendum)
Stress test was good: it is a low risk study for coronary artery disease.  With all other tests being negative, it is safe to assume that the problems are related to the patient's physical deconditioning.  I would recommend to start exercising, i.e. walking and gradually build up his stamina back.  Thank you

## 2017-08-13 NOTE — Telephone Encounter (Signed)
Please advise 

## 2017-08-14 NOTE — Telephone Encounter (Signed)
Patient advised of dr plotnikov's note/instructions

## 2017-08-21 DIAGNOSIS — H25013 Cortical age-related cataract, bilateral: Secondary | ICD-10-CM | POA: Diagnosis not present

## 2017-08-21 DIAGNOSIS — H2513 Age-related nuclear cataract, bilateral: Secondary | ICD-10-CM | POA: Diagnosis not present

## 2017-08-21 DIAGNOSIS — H402222 Chronic angle-closure glaucoma, left eye, moderate stage: Secondary | ICD-10-CM | POA: Diagnosis not present

## 2017-08-21 DIAGNOSIS — H2512 Age-related nuclear cataract, left eye: Secondary | ICD-10-CM | POA: Diagnosis not present

## 2017-08-21 DIAGNOSIS — H402211 Chronic angle-closure glaucoma, right eye, mild stage: Secondary | ICD-10-CM | POA: Diagnosis not present

## 2017-08-22 DIAGNOSIS — L821 Other seborrheic keratosis: Secondary | ICD-10-CM | POA: Diagnosis not present

## 2017-08-22 DIAGNOSIS — D1801 Hemangioma of skin and subcutaneous tissue: Secondary | ICD-10-CM | POA: Diagnosis not present

## 2017-08-22 DIAGNOSIS — Z85828 Personal history of other malignant neoplasm of skin: Secondary | ICD-10-CM | POA: Diagnosis not present

## 2017-08-22 DIAGNOSIS — H61001 Unspecified perichondritis of right external ear: Secondary | ICD-10-CM | POA: Diagnosis not present

## 2017-08-22 DIAGNOSIS — D2371 Other benign neoplasm of skin of right lower limb, including hip: Secondary | ICD-10-CM | POA: Diagnosis not present

## 2017-08-22 DIAGNOSIS — L570 Actinic keratosis: Secondary | ICD-10-CM | POA: Diagnosis not present

## 2017-08-22 DIAGNOSIS — D692 Other nonthrombocytopenic purpura: Secondary | ICD-10-CM | POA: Diagnosis not present

## 2017-08-22 DIAGNOSIS — C44319 Basal cell carcinoma of skin of other parts of face: Secondary | ICD-10-CM | POA: Diagnosis not present

## 2017-08-22 DIAGNOSIS — D2261 Melanocytic nevi of right upper limb, including shoulder: Secondary | ICD-10-CM | POA: Diagnosis not present

## 2017-08-28 ENCOUNTER — Ambulatory Visit (INDEPENDENT_AMBULATORY_CARE_PROVIDER_SITE_OTHER): Payer: PPO | Admitting: Internal Medicine

## 2017-08-28 ENCOUNTER — Encounter: Payer: Self-pay | Admitting: Internal Medicine

## 2017-08-28 DIAGNOSIS — R079 Chest pain, unspecified: Secondary | ICD-10-CM | POA: Diagnosis not present

## 2017-08-28 DIAGNOSIS — R0609 Other forms of dyspnea: Secondary | ICD-10-CM

## 2017-08-28 DIAGNOSIS — D649 Anemia, unspecified: Secondary | ICD-10-CM | POA: Insufficient documentation

## 2017-08-28 DIAGNOSIS — R0600 Dyspnea, unspecified: Secondary | ICD-10-CM

## 2017-08-28 DIAGNOSIS — M5440 Lumbago with sciatica, unspecified side: Secondary | ICD-10-CM | POA: Diagnosis not present

## 2017-08-28 DIAGNOSIS — E538 Deficiency of other specified B group vitamins: Secondary | ICD-10-CM | POA: Diagnosis not present

## 2017-08-28 DIAGNOSIS — G2581 Restless legs syndrome: Secondary | ICD-10-CM | POA: Diagnosis not present

## 2017-08-28 MED ORDER — HYDROCODONE-ACETAMINOPHEN 7.5-325 MG PO TABS: 1.0000 | ORAL_TABLET | Freq: Four times a day (QID) | ORAL | 0 refills | Status: DC | PRN

## 2017-08-28 MED ORDER — ROPINIROLE HCL 2 MG PO TABS: 4.0000 mg | ORAL_TABLET | Freq: Every day | ORAL | 5 refills | Status: DC

## 2017-08-28 NOTE — Progress Notes (Signed)
Subjective:  Patient ID: Justin Pagan Su Grand., male    DOB: 06-30-1941  Age: 77 y.o. MRN: [id number]  CC: No chief complaint on file.   HPI Justin [name] [employer]. presents for CP and DOE f/u. No CP. DOE is better C/o RLS is worse    Outpatient Medications Prior to Visit  Medication Sig Dispense Refill  . aspirin 81 MG EC tablet Take 81 mg by mouth daily.      . Cholecalciferol 1000 UNITS tablet Take 1,000 Units by mouth daily.      . Cyanocobalamin (VITAMIN B-12) 1000 MCG SUBL DISSOLVE 2 TABLETS IN MOUTH EVERY DAY 60 tablet 8  . dorzolamide (TRUSOPT) 2 % ophthalmic solution 1 drop daily.    . finasteride (PROSCAR) 5 MG tablet TAKE 1 TABLET (5 MG TOTAL) BY MOUTH DAILY. 90 tablet 1  . gabapentin (NEURONTIN) 300 MG capsule TAKE 1 CAPSULE (300 MG TOTAL) BY MOUTH AT BEDTIME. 30 capsule 1  . HYDROcodone-acetaminophen (NORCO) 7.5-325 MG tablet Take 1 tablet by mouth every 6 (six) hours as needed for severe pain. Please fill on or after 08/03/17 120 tablet 0  . latanoprost (XALATAN) 0.005 % ophthalmic solution Place 1 drop into both eyes daily.    . meclizine (ANTIVERT) 12.5 MG tablet Take 1 tablet (12.5 mg total) by mouth 3 (three) times daily as needed for dizziness. 60 tablet 1  . Multiple Vitamin (MULTIVITAMIN) capsule Take 1 capsule by mouth daily.      . Multiple Vitamins-Minerals (PRESERVISION AREDS PO) Take 2 tablets by mouth 2 (two) times daily.    [name] rOPINIRole (REQUIP) 2 MG tablet Take 1-2 tablets (2-4 mg total) by mouth at bedtime. 60 tablet 5  . tamsulosin (FLOMAX) 0.4 MG CAPS capsule TAKE ONE CAPSULE EVERY DAY 90 capsule 1  . terazosin (HYTRIN) 2 MG capsule Take 1 capsule (2 mg total) by mouth at bedtime. 30 capsule 11  . brinzolamide (AZOPT) 1 % ophthalmic suspension 1 drop 3 (three) times daily.     No facility-administered medications prior to visit.     ROS Review of Systems  Constitutional: Negative for appetite change, fatigue and unexpected weight change.  HENT:  Negative for congestion, nosebleeds, sneezing, sore throat and trouble swallowing.   Eyes: Negative for itching and visual disturbance.  Respiratory: Negative for cough.   Cardiovascular: Negative for chest pain, palpitations and leg swelling.  Gastrointestinal: Negative for abdominal distention, blood in stool, diarrhea and nausea.  Genitourinary: Negative for frequency and hematuria.  Musculoskeletal: Positive for arthralgias. Negative for back pain, gait problem, joint swelling and neck pain.  Skin: Negative for rash.  Neurological: Negative for dizziness, tremors, speech difficulty and weakness.  Psychiatric/Behavioral: Negative for agitation, dysphoric mood and sleep disturbance. The patient is not nervous/anxious.     Objective:  BP 122/68 (BP Location: Right Arm, Patient Position: Sitting, Cuff Size: Normal)   Pulse 61   Temp 97.8 F (36.6 C) (Oral)   Ht 6' (1.829 m)   Wt 194 lb (88 kg)   SpO2 97%   BMI 26.31 kg/m   BP Readings from Last 3 Encounters:  08/28/17 122/68  07/31/17 124/66  05/03/17 132/78    Wt Readings from Last 3 Encounters:  08/28/17 194 lb (88 kg)  08/08/17 191 lb (86.6 kg)  07/31/17 191 lb (86.6 kg)    Physical Exam  Constitutional: He is oriented to person, place, and time. He appears well-developed. No distress.  NAD  HENT:  Mouth/Throat: Oropharynx is clear  and moist.  Eyes: Conjunctivae are normal. Pupils are equal, round, and reactive to light.  Neck: Normal range of motion. No JVD present. No thyromegaly present.  Cardiovascular: Normal rate, regular rhythm, normal heart sounds and intact distal pulses. Exam reveals no gallop and no friction rub.  No murmur heard. Pulmonary/Chest: Effort normal and breath sounds normal. No respiratory distress. He has no wheezes. He has no rales. He exhibits no tenderness.  Abdominal: Soft. Bowel sounds are normal. He exhibits no distension and no mass. There is no tenderness. There is no rebound and no  guarding.  Musculoskeletal: Normal range of motion. He exhibits tenderness. He exhibits no edema.  Lymphadenopathy:    He has no cervical adenopathy.  Neurological: He is alert and oriented to person, place, and time. He has normal reflexes. No cranial nerve deficit. He exhibits normal muscle tone. He displays a negative Romberg sign. Coordination abnormal. Gait normal.  Skin: Skin is warm and dry. No rash noted.  Psychiatric: He has a normal mood and affect. His behavior is normal. Judgment and thought content normal.    Lab Results  Component Value Date   WBC 6.2 07/26/2017   HGB 11.3 (L) 07/26/2017   HCT 33.2 (L) 07/26/2017   PLT 239.0 07/26/2017   GLUCOSE 103 (H) 07/31/2017   CHOL 136 01/12/2017   TRIG 67.0 01/12/2017   HDL 54.70 01/12/2017   LDLCALC 68 01/12/2017   ALT 11 07/31/2017   AST 17 07/31/2017   NA 140 07/31/2017   K 4.3 07/31/2017   CL 107 07/31/2017   CREATININE 1.16 07/31/2017   BUN 22 07/31/2017   CO2 26 07/31/2017   TSH 2.83 07/31/2017   PSA 0.47 08/30/2016   HGBA1C 5.6 07/28/2015    Ct Angio Chest Pe W Or Wo Contrast  Result Date: 08/02/2017 CLINICAL DATA:  Slight shortness of breath, fatigue for several months. EXAM: CT ANGIOGRAPHY CHEST WITH CONTRAST TECHNIQUE: Multidetector CT imaging of the chest was performed using the standard protocol during bolus administration of intravenous contrast. Multiplanar CT image reconstructions and MIPs were obtained to evaluate the vascular anatomy. CONTRAST:  41mL ISOVUE-370 IOPAMIDOL (ISOVUE-370) INJECTION 76% COMPARISON:  03/03/2010 FINDINGS: Cardiovascular: Satisfactory opacification of the pulmonary arteries to the segmental level. No evidence of pulmonary embolism. Normal heart size. No pericardial effusion. Normal caliber thoracic aorta. No thoracic aortic dissection. Thoracic aortic atherosclerosis. Mediastinum/Nodes: No enlarged mediastinal, hilar, or axillary lymph nodes. Thyroid gland, trachea, and esophagus  demonstrate no significant findings. Lungs/Pleura: Lungs are clear. No pleural effusion or pneumothorax. Upper Abdomen: No acute abnormality. Musculoskeletal: Levoscoliosis of the thoracolumbar spine. No acute osseous abnormality. No aggressive osseous lesion. Review of the MIP images confirms the above findings. IMPRESSION: 1. No evidence pulmonary embolus. 2. Normal caliber thoracic aorta without dissection. 3.  Aortic Atherosclerosis (ICD10-I70.0). Electronically Signed   By: Kathreen Devoid   On: 08/02/2017 14:21    Assessment & Plan:   There are no diagnoses linked to this encounter. I have discontinued Lewellen Jr.'s brinzolamide. I am also having him maintain his aspirin, multivitamin, Cholecalciferol, latanoprost, meclizine, Vitamin B-12, terazosin, gabapentin, finasteride, Multiple Vitamins-Minerals (PRESERVISION AREDS PO), rOPINIRole, HYDROcodone-acetaminophen, tamsulosin, and dorzolamide.  No orders of the defined types were placed in this encounter.    Follow-up: No Follow-up on file.  [name], MD

## 2017-08-28 NOTE — Assessment & Plan Note (Signed)
Norco Rx x3

## 2017-08-28 NOTE — Assessment & Plan Note (Addendum)
Worse Requip 4-6 mg at hs

## 2017-08-28 NOTE — Assessment & Plan Note (Signed)
Tests - reviewed. Resolved. Start exercising

## 2017-08-28 NOTE — Assessment & Plan Note (Signed)
Chronic, mild, lifelong

## 2017-08-28 NOTE — Assessment & Plan Note (Signed)
Tests - reviewed. Deconditioning. Start exercising

## 2017-08-28 NOTE — Assessment & Plan Note (Signed)
On B12 

## 2017-09-12 DIAGNOSIS — H35012 Changes in retinal vascular appearance, left eye: Secondary | ICD-10-CM | POA: Diagnosis not present

## 2017-09-12 DIAGNOSIS — H353221 Exudative age-related macular degeneration, left eye, with active choroidal neovascularization: Secondary | ICD-10-CM | POA: Diagnosis not present

## 2017-09-12 DIAGNOSIS — H43812 Vitreous degeneration, left eye: Secondary | ICD-10-CM | POA: Diagnosis not present

## 2017-09-12 DIAGNOSIS — H3562 Retinal hemorrhage, left eye: Secondary | ICD-10-CM | POA: Diagnosis not present

## 2017-09-17 ENCOUNTER — Other Ambulatory Visit: Payer: Self-pay | Admitting: Internal Medicine

## 2017-09-29 ENCOUNTER — Emergency Department (HOSPITAL_COMMUNITY)
Admission: EM | Admit: 2017-09-29 | Discharge: 2017-09-29 | Disposition: A | Discharge status: Home / Self Care | Payer: PPO | Attending: Emergency Medicine | Admitting: Emergency Medicine

## 2017-09-29 ENCOUNTER — Encounter (HOSPITAL_COMMUNITY): Payer: Self-pay

## 2017-09-29 DIAGNOSIS — Z5321 Procedure and treatment not carried out due to patient leaving prior to being seen by health care provider: Secondary | ICD-10-CM | POA: Diagnosis not present

## 2017-09-29 DIAGNOSIS — R109 Unspecified abdominal pain: Secondary | ICD-10-CM | POA: Insufficient documentation

## 2017-09-29 LAB — URINALYSIS, ROUTINE W REFLEX MICROSCOPIC
Bilirubin Urine: NEGATIVE
Glucose, UA: NEGATIVE mg/dL
Hgb urine dipstick: NEGATIVE
Ketones, ur: 5 mg/dL — AB
Leukocytes, UA: NEGATIVE
Nitrite: NEGATIVE
Protein, ur: NEGATIVE mg/dL
Specific Gravity, Urine: 1.023 (ref 1.005–1.030)
pH: 5 (ref 5.0–8.0)

## 2017-09-29 NOTE — ED Triage Notes (Signed)
Pt reports 3/10 right flank pain w/ radiation to groin. Pt denies dysuria or penile d/c. Pt denies fevers at home. Pt A+OX4, speaking in complete sentences, ambulatory independently to triage.

## 2017-10-01 ENCOUNTER — Ambulatory Visit (INDEPENDENT_AMBULATORY_CARE_PROVIDER_SITE_OTHER): Payer: PPO | Admitting: Internal Medicine

## 2017-10-01 ENCOUNTER — Encounter: Payer: Self-pay | Admitting: Internal Medicine

## 2017-10-01 DIAGNOSIS — M545 Low back pain, unspecified: Secondary | ICD-10-CM

## 2017-10-01 MED ORDER — METHYLPREDNISOLONE ACETATE 40 MG/ML IJ SUSP
40.0000 mg | Freq: Once | INTRAMUSCULAR | Status: AC
  Administered 2017-10-01: 40 mg via INTRAMUSCULAR

## 2017-10-01 NOTE — Assessment & Plan Note (Signed)
Pain is acute since Friday, suspect muscular injury. Given depo-medrol 40 mg IM at visit. Low suspicion for kidney stone given U/A normal and history compatible but not likely. If no improvement will check CT abdomen non contrast for stone. Advised to continue with heating pad. Given chronic pain regimen it is not appropriate or needed to increase hydrocodone.

## 2017-10-01 NOTE — Progress Notes (Signed)
   Subjective:    Patient ID: Justin Pagan Su Grand., male    DOB: May 15, 1941, 76 y.o.   MRN: [id number]  HPI The patient is a 77 YO man coming in for lower right back pain. Going on since Friday. Denies any known injury or overuse but due to wife's illness is taking care of all household tasks. Saturday worsened to 8/10 all the time and he went to ER (left after 3 hours without being seen, U/A done negative). The pain improved some to 4/10 pain all the time on Sunday. He has taken his usual hydrocodone which does not seem to help the pain. Tried ibuprofen which did not help. Used heating pad which helped some. Denies fevers or chills. Denies nausea or vomiting. Denies constipation or diarrhea. Denies blood in stool or urine. No history of kidney stones. No prior abdominal imaging available for review in Epic. Pain rarely radiates into the right lower abdomen for 5 minutes then stops (3-4 times total). Denies history of back injury or surgery. Denies numbness or weakness. Pain does not go into legs.   Review of Systems  Constitutional: Positive for activity change. Negative for appetite change, chills, fatigue, fever and unexpected weight change.  HENT: Negative.   Eyes: Negative.   Respiratory: Negative for cough, chest tightness and shortness of breath.   Cardiovascular: Negative for chest pain, palpitations and leg swelling.  Gastrointestinal: Negative for abdominal distention, abdominal pain, constipation, diarrhea, nausea and vomiting.  Musculoskeletal: Positive for arthralgias, back pain and myalgias.  Skin: Negative.   Neurological: Negative.  Negative for weakness and numbness.  Psychiatric/Behavioral: Negative.       Objective:   Physical Exam  Constitutional: He is oriented to person, place, and time. He appears well-developed and well-nourished.  HENT:  Head: Normocephalic and atraumatic.  Eyes: EOM are normal.  Neck: Normal range of motion.  Cardiovascular: Normal rate and  regular rhythm.  Pulmonary/Chest: Effort normal and breath sounds normal. No respiratory distress. He has no wheezes. He has no rales.  Abdominal: Soft. Bowel sounds are normal. He exhibits no distension. There is no tenderness. There is no rebound.  Musculoskeletal: He exhibits tenderness. He exhibits no edema.  Pain paraspinal lumbar right, no flank tenderness bilaterally  Neurological: He is alert and oriented to person, place, and time. Coordination normal.  Skin: Skin is warm and dry.  Psychiatric: He has a normal mood and affect.   Vitals:   10/01/17 1412  BP: 138/60  Pulse: 77  Temp: 97.6 F (36.4 C)  TempSrc: Oral  SpO2: 97%  Weight: 195 lb (88.5 kg)  Height: 6' (1.829 m)      Assessment & Plan:  Depo-medrol 40 mg IM given at visit

## 2017-10-01 NOTE — Patient Instructions (Signed)
We have given you an injection today which will help the back.   If you start feeling worse or fail to get any better in 2 days call the office.

## 2017-10-03 DIAGNOSIS — H402211 Chronic angle-closure glaucoma, right eye, mild stage: Secondary | ICD-10-CM | POA: Diagnosis not present

## 2017-10-03 DIAGNOSIS — H2181 Floppy iris syndrome: Secondary | ICD-10-CM | POA: Diagnosis not present

## 2017-10-03 DIAGNOSIS — H401111 Primary open-angle glaucoma, right eye, mild stage: Secondary | ICD-10-CM | POA: Diagnosis not present

## 2017-10-03 DIAGNOSIS — H2511 Age-related nuclear cataract, right eye: Secondary | ICD-10-CM | POA: Diagnosis not present

## 2017-10-03 DIAGNOSIS — H5703 Miosis: Secondary | ICD-10-CM | POA: Diagnosis not present

## 2017-10-03 DIAGNOSIS — H25811 Combined forms of age-related cataract, right eye: Secondary | ICD-10-CM | POA: Diagnosis not present

## 2017-10-05 ENCOUNTER — Ambulatory Visit: Payer: Self-pay

## 2017-10-05 NOTE — Telephone Encounter (Signed)
Patient states his hip pain is a little worse today---per Jodi Mourning, NP, ok for patient to take 2 capsules gabapentin daily, once in morning and once in evening---300mg  each tablet---routing to dr crawford---can you please send referral for CT scan per patient request?  Please advise, I will call patient back, thanks

## 2017-10-05 NOTE — Telephone Encounter (Signed)
Justin [name] called again regarding his earlier message. I have reached out to [place], [place] at the office to reach out to him this afternoon.

## 2017-10-05 NOTE — Telephone Encounter (Signed)
Pt. Seen 10/01/17 with right sided back pain. Reports that now it has moved to his hip and he is having increased pain. Heating pad helps "a little bit." States Dr. Sharlet Salina mentioned him having a CT Scan. States he needs pain relief as well. Please advise pt.  Answer Assessment - Initial Assessment Questions 1. LOCATION and RADIATION: "Where is the pain located?"      Right hip 2. QUALITY: "What does the pain feel like?"  (e.g., sharp, dull, aching, burning)     Aches 3. SEVERITY: "How bad is the pain?" "What does it keep you from doing?"   (Scale 1-10; or mild, moderate, severe)   -  MILD (1-3): doesn't interfere with normal activities    -  MODERATE (4-7): interferes with normal activities (e.g., work or school) or awakens from sleep, limping    -  SEVERE (8-10): excruciating pain, unable to do any normal activities, unable to walk     4-5 4. ONSET: "When did the pain start?" "Does it come and go, or is it there all the time?"     Started 1 week ago 5. WORK OR EXERCISE: "Has there been any recent work or exercise that involved this part of the body?"      No 6. CAUSE: "What do you think is causing the hip pain?"      Unsure 7. AGGRAVATING FACTORS: "What makes the hip pain worse?" (e.g., walking, climbing stairs, running)     Moving 8. OTHER SYMPTOMS: "Do you have any other symptoms?" (e.g., back pain, pain shooting down leg,  fever, rash)     No  Protocols used: HIP PAIN-A-AH

## 2017-10-08 ENCOUNTER — Ambulatory Visit (INDEPENDENT_AMBULATORY_CARE_PROVIDER_SITE_OTHER)
Admission: RE | Admit: 2017-10-08 | Discharge: 2017-10-08 | Disposition: A | Discharge status: Home / Self Care | Payer: PPO | Source: Ambulatory Visit | Attending: Family | Admitting: Family

## 2017-10-08 ENCOUNTER — Encounter: Payer: Self-pay | Admitting: Family

## 2017-10-08 ENCOUNTER — Ambulatory Visit (INDEPENDENT_AMBULATORY_CARE_PROVIDER_SITE_OTHER): Payer: PPO | Admitting: Family

## 2017-10-08 DIAGNOSIS — R1031 Right lower quadrant pain: Secondary | ICD-10-CM

## 2017-10-08 DIAGNOSIS — R109 Unspecified abdominal pain: Secondary | ICD-10-CM | POA: Diagnosis not present

## 2017-10-08 MED ORDER — METHOCARBAMOL 500 MG PO TABS: 500.0000 mg | ORAL_TABLET | Freq: Three times a day (TID) | ORAL | 0 refills | Status: DC | PRN

## 2017-10-08 NOTE — Progress Notes (Signed)
Master Touchet Swiss Brooke Bonito. is a 77 y.o. male with the following history as recorded in EpicCare:  Patient Active Problem List   Diagnosis Date Noted  . Anemia 08/28/2017  . DOE (dyspnea on exertion) 07/31/2017  . Fatigue 07/31/2017  . HTN (hypertension) 11/29/2016  . Acute bronchitis 04/12/2016  . Smoking greater than 30 pack years 02/02/2016  . Vertigo 02/09/2015  . Left hand pain 01/14/2015  . Elevated TSH 04/09/2014  . Abdominal pain, lower 04/07/2014  . Well adult exam 02/28/2013  . Alcoholism in recovery ([place]) 02/28/2013  . Ingrowing toenail of left foot 08/29/2012  . Allergic rhinitis 10/10/2011  . Dupuytren's contracture of hand 07/05/2011  . Vitamin B12 deficiency 12/23/2010  . BPH (benign prostatic hyperplasia) 12/23/2010  . SINUSITIS, ACUTE 03/24/2010  . CHEST PAIN 03/01/2010  . Low back pain 07/27/2009  . TOBACCO USE, QUIT 07/27/2009  . DIVERTICULOSIS, COLON 03/28/2009  . DIVERTICULITIS 03/26/2009  . ABDOMINAL PAIN, LOWER 03/26/2009  . RESTLESS LEG SYNDROME 10/28/2008  . SKIN RASH, ALLERGIC 10/01/2008  . ARM PAIN, LEFT 10/01/2008  . Edema 10/01/2008  . Neuropathy 04/22/2008  . UTI 02/26/2008  . RECTAL FISSURE 02/24/2008  . FEVER UNSPECIFIED 02/24/2008  . Vitamin D deficiency 01/21/2008  . INSOMNIA, CHRONIC 12/23/2007  . Depression 12/23/2007  . Osteoarthritis 12/23/2007  . SYNCOPE 12/23/2007  . DIZZINESS 12/23/2007  . CAROTID BRUIT, LEFT 12/23/2007  . FRACTURE, ARM, LEFT 12/23/2007    Current Outpatient Medications  Medication Sig Dispense Refill  . aspirin 81 MG EC tablet Take 81 mg by mouth daily.      . Cholecalciferol 1000 UNITS tablet Take 1,000 Units by mouth daily.      . Cyanocobalamin (VITAMIN B-12) 1000 MCG SUBL DISSOLVE 2 TABLETS IN MOUTH EVERY DAY 60 tablet 8  . dorzolamide (TRUSOPT) 2 % ophthalmic solution 1 drop daily.    . finasteride (PROSCAR) 5 MG tablet TAKE 1 TABLET (5 MG TOTAL) BY MOUTH DAILY. 90 tablet 3  . gabapentin (NEURONTIN) 300 MG  capsule TAKE 1 CAPSULE (300 MG TOTAL) BY MOUTH AT BEDTIME. 30 capsule 1  . HYDROcodone-acetaminophen (NORCO) 7.5-325 MG tablet Take 1 tablet by mouth every 6 (six) hours as needed for severe pain. Please fill on or after 10/31/17 120 tablet 0  . ketorolac (ACULAR) 0.5 % ophthalmic solution 1 DROP IN RIGHT EYE FOUR TIMES A DAY START 72 HOURS PRIOR TO SURGERY  1  . latanoprost (XALATAN) 0.005 % ophthalmic solution Place 1 drop into both eyes daily.    . meclizine (ANTIVERT) 12.5 MG tablet Take 1 tablet (12.5 mg total) by mouth 3 (three) times daily as needed for dizziness. 60 tablet 1  . Multiple Vitamin (MULTIVITAMIN) capsule Take 1 capsule by mouth daily.      . Multiple Vitamins-Minerals (PRESERVISION AREDS PO) Take 2 tablets by mouth 2 (two) times daily.    [name] ofloxacin (OCUFLOX) 0.3 % ophthalmic solution 1 DROP IN BOTH EYES FOUR TIMES A DAY START 72 HOURS PRIOR TO SURGERY  1  . prednisoLONE acetate (PRED FORTE) 1 % ophthalmic suspension 1 DROP IN RIGHT EYE FOUR TIMES A DAY START AFTER SURGERY  1  . rOPINIRole (REQUIP) 2 MG tablet Take 2-3 tablets (4-6 mg total) by mouth at bedtime. 90 tablet 5  . tamsulosin (FLOMAX) 0.4 MG CAPS capsule TAKE ONE CAPSULE EVERY DAY 90 capsule 1  . terazosin (HYTRIN) 2 MG capsule TAKE 1 CAPSULE (2 MG TOTAL) BY MOUTH AT BEDTIME. 90 capsule 3  . methocarbamol (ROBAXIN)  500 MG tablet Take 1 tablet (500 mg total) by mouth every 8 (eight) hours as needed for muscle spasms. 30 tablet 0   No current facility-administered medications for this visit.     Allergies: Levofloxacin  Past Medical History:  Diagnosis Date  . Alcoholism ([place])    Dry 13 years  . Basal cell carcinoma    Right Chest  . Chronic insomnia    On ambien x 2 years  . Depression   . Diverticulosis of colon   . Dizzy spells   . Elbow fracture, left 2009   [name]  . Glaucoma   . History of TIAs   . Hyperkeratosis   . LBP (low back pain)   . Melanoma [facility]) 2009   x3 [name]  .  Osteoarthritis   . Restless leg syndrome   . Tinnitus   . Tobacco abuse     Past Surgical History:  Procedure Laterality Date  . CATARACT EXTRACTION    . MELANOMA EXCISION  2009   x3  . RECONSTRUCTION MEDIAL COLLATERAL LIGAMENT ELBOW W/ TENDON GRAFT  2009   Left, [name]    Family History  Problem Relation Age of Onset  . Cancer Mother        ?breast  . Cancer Father        Lung    Social History   Tobacco Use  . Smoking status: Former Smoker    Packs/day: 3.00    Years: 38.00    Pack years: 114.00    Types: Cigarettes    Last attempt to quit: 08/30/1990    Years since quitting: 27.1  . Smokeless tobacco: Never Used  . Tobacco comment: states he quit 1986  Substance Use Topics  . Alcohol use: No    Alcohol/week: 0.0 oz    Comment: PREVIOUS - DRY 13 yrs    Subjective:  Patient presents with concerns for persisting right sided flank/ right hip pain x 1 week; was seen here on 4/1 with suspicion for muscular source- was given injection of steroids in the office; discussed that no benefit with the steroid injection; pain "comes and goes"; has tried to increase his Gabapentin with no benefit; symptoms seemed to start after increased amount of housework; notes that pain seems to have moved from his low back down into his hip; has also developed abdominal pain in the past few days- feels more full and bloated; already on high dose of Hydrocodone for chronic left elbow/ hand pain- feels that Hydrocodone has offered no benefit with this particular pain;   Of note, had cataract surgery last Wednesday;   Objective:  Vitals:   10/08/17 1359  BP: 132/68  Pulse: 82  Temp: (!) 97.5 F (36.4 C)  TempSrc: Oral  SpO2: 96%  Weight: 187 lb 1.3 oz (84.9 kg)  Height: 6' (1.829 m)    General: Well developed, well nourished, in no acute distress  Skin : Warm and dry.  Head: Normocephalic and atraumatic  Lungs: Respirations unlabored; clear to auscultation bilaterally without wheeze,  rales, rhonchi  CVS exam: normal rate and regular rhythm.  Abdomen: Soft; nontender; nondistended; normoactive bowel sounds; no masses or hepatosplenomegaly  Neurologic: Alert and oriented; speech intact; face symmetrical; moves all extremities well; CNII-XII intact without focal deficit   Assessment:  1. Right lower quadrant abdominal pain     Plan:  ? Muscular source vs renal stone; check renal stone CT; Rx for Robaxin 500 mg; may also need to  consider oral prednisone; follow-up to be determined after CT results obtained.   No follow-ups on file.  Orders Placed This Encounter  Procedures  . CT RENAL STONE STUDY    SS. Cecille Rubin [phone]/ M[facility]    Standing Status:   Future    Number of Occurrences:   1    Standing Expiration Date:   01/08/2019    Order Specific Question:   Preferred imaging location?    Answer:   [place]    Order Specific Question:   Radiology Contrast Protocol - do NOT remove file path    Answer:   [email or web address]    Requested Prescriptions   Signed Prescriptions Disp Refills  . methocarbamol (ROBAXIN) 500 MG tablet 30 tablet 0    Sig: Take 1 tablet (500 mg total) by mouth every 8 (eight) hours as needed for muscle spasms.

## 2017-10-08 NOTE — Telephone Encounter (Signed)
Would recommend him coming back in for pain management visit if pain not controlled and can assess for CT scan with that provider at that time. Given his chronic pain medicines I would not make changes to medical regimen over the phone.

## 2017-10-09 NOTE — Progress Notes (Signed)
Spoke with patient today and results given. He was still having a little of the stomach pain and was doing alittle better with the muscle relaxer. He in the meantime wants clarification on if he should see a surgeon or not based on and Dr. Judeen Hammans opinion. He stated if it was felt that he didn't really need to see one right now (since symptoms were more muscular related) or not with the hernia and gallstones. He just wants to make sure he is making the right choice and wanted re-assurance on what to do. Please advise

## 2017-10-10 ENCOUNTER — Other Ambulatory Visit: Payer: Self-pay | Admitting: Family

## 2017-10-10 DIAGNOSIS — K429 Umbilical hernia without obstruction or gangrene: Secondary | ICD-10-CM

## 2017-10-10 DIAGNOSIS — K8020 Calculus of gallbladder without cholecystitis without obstruction: Secondary | ICD-10-CM

## 2017-10-11 ENCOUNTER — Other Ambulatory Visit: Payer: Self-pay | Admitting: Internal Medicine

## 2017-10-31 DIAGNOSIS — B0230 Zoster ocular disease, unspecified: Secondary | ICD-10-CM | POA: Diagnosis not present

## 2017-10-31 DIAGNOSIS — H43812 Vitreous degeneration, left eye: Secondary | ICD-10-CM | POA: Diagnosis not present

## 2017-10-31 DIAGNOSIS — H353221 Exudative age-related macular degeneration, left eye, with active choroidal neovascularization: Secondary | ICD-10-CM | POA: Diagnosis not present

## 2017-10-31 DIAGNOSIS — H353121 Nonexudative age-related macular degeneration, left eye, early dry stage: Secondary | ICD-10-CM | POA: Diagnosis not present

## 2017-11-06 DIAGNOSIS — H25012 Cortical age-related cataract, left eye: Secondary | ICD-10-CM | POA: Diagnosis not present

## 2017-11-06 DIAGNOSIS — H2512 Age-related nuclear cataract, left eye: Secondary | ICD-10-CM | POA: Diagnosis not present

## 2017-11-21 DIAGNOSIS — D225 Melanocytic nevi of trunk: Secondary | ICD-10-CM | POA: Diagnosis not present

## 2017-11-21 DIAGNOSIS — D2371 Other benign neoplasm of skin of right lower limb, including hip: Secondary | ICD-10-CM | POA: Diagnosis not present

## 2017-11-21 DIAGNOSIS — L570 Actinic keratosis: Secondary | ICD-10-CM | POA: Diagnosis not present

## 2017-11-21 DIAGNOSIS — L821 Other seborrheic keratosis: Secondary | ICD-10-CM | POA: Diagnosis not present

## 2017-11-21 DIAGNOSIS — Z85828 Personal history of other malignant neoplasm of skin: Secondary | ICD-10-CM | POA: Diagnosis not present

## 2017-11-21 DIAGNOSIS — D2271 Melanocytic nevi of right lower limb, including hip: Secondary | ICD-10-CM | POA: Diagnosis not present

## 2017-11-21 DIAGNOSIS — D1801 Hemangioma of skin and subcutaneous tissue: Secondary | ICD-10-CM | POA: Diagnosis not present

## 2017-11-23 ENCOUNTER — Encounter: Payer: Self-pay | Admitting: Internal Medicine

## 2017-11-23 ENCOUNTER — Ambulatory Visit (INDEPENDENT_AMBULATORY_CARE_PROVIDER_SITE_OTHER): Payer: PPO | Admitting: Internal Medicine

## 2017-11-23 DIAGNOSIS — B0230 Zoster ocular disease, unspecified: Secondary | ICD-10-CM | POA: Insufficient documentation

## 2017-11-23 DIAGNOSIS — G8929 Other chronic pain: Secondary | ICD-10-CM | POA: Diagnosis not present

## 2017-11-23 DIAGNOSIS — M545 Low back pain, unspecified: Secondary | ICD-10-CM

## 2017-11-23 DIAGNOSIS — I10 Essential (primary) hypertension: Secondary | ICD-10-CM | POA: Diagnosis not present

## 2017-11-23 DIAGNOSIS — E538 Deficiency of other specified B group vitamins: Secondary | ICD-10-CM | POA: Diagnosis not present

## 2017-11-23 DIAGNOSIS — K8020 Calculus of gallbladder without cholecystitis without obstruction: Secondary | ICD-10-CM | POA: Diagnosis not present

## 2017-11-23 MED ORDER — HYDROCODONE-ACETAMINOPHEN 7.5-325 MG PO TABS: 1.0000 | ORAL_TABLET | Freq: Four times a day (QID) | ORAL | 0 refills | Status: DC | PRN

## 2017-11-23 NOTE — Assessment & Plan Note (Signed)
Surg ref 

## 2017-11-23 NOTE — Progress Notes (Signed)
Subjective:  Patient ID: Justin [name]., male    DOB: 1941/01/14  Age: 77 y.o. MRN: [id number]  CC: No chief complaint on file.   HPI Justin [name]. presents for H zoster in the eye, B12 def, GS's on CT scan f/u, LBP  Outpatient Medications Prior to Visit  Medication Sig Dispense Refill  . aspirin 81 MG EC tablet Take 81 mg by mouth daily.      . Cholecalciferol 1000 UNITS tablet Take 1,000 Units by mouth daily.      . Cyanocobalamin (VITAMIN B-12) 1000 MCG SUBL DISSOLVE 2 TABLETS IN MOUTH EVERY DAY 60 tablet 8  . dorzolamide (TRUSOPT) 2 % ophthalmic solution 1 drop daily.    . finasteride (PROSCAR) 5 MG tablet TAKE 1 TABLET (5 MG TOTAL) BY MOUTH DAILY. 90 tablet 3  . gabapentin (NEURONTIN) 300 MG capsule TAKE 1 CAPSULE (300 MG TOTAL) BY MOUTH AT BEDTIME. 90 capsule 1  . HYDROcodone-acetaminophen (NORCO) 7.5-325 MG tablet Take 1 tablet by mouth every 6 (six) hours as needed for severe pain. Please fill on or after 10/31/17 120 tablet 0  . latanoprost (XALATAN) 0.005 % ophthalmic solution Place 1 drop into both eyes daily.    . meclizine (ANTIVERT) 12.5 MG tablet Take 1 tablet (12.5 mg total) by mouth 3 (three) times daily as needed for dizziness. 60 tablet 1  . Multiple Vitamin (MULTIVITAMIN) capsule Take 1 capsule by mouth daily.      . Multiple Vitamins-Minerals (PRESERVISION AREDS PO) Take 2 tablets by mouth 2 (two) times daily.    [name] ofloxacin (OCUFLOX) 0.3 % ophthalmic solution 1 DROP IN BOTH EYES FOUR TIMES A DAY START 72 HOURS PRIOR TO SURGERY  1  . prednisoLONE acetate (PRED FORTE) 1 % ophthalmic suspension 1 DROP IN RIGHT EYE FOUR TIMES A DAY START AFTER SURGERY  1  . rOPINIRole (REQUIP) 2 MG tablet Take 2-3 tablets (4-6 mg total) by mouth at bedtime. 90 tablet 5  . tamsulosin (FLOMAX) 0.4 MG CAPS capsule TAKE ONE CAPSULE EVERY DAY 90 capsule 1  . terazosin (HYTRIN) 2 MG capsule TAKE 1 CAPSULE (2 MG TOTAL) BY MOUTH AT BEDTIME. 90 capsule 3  . gabapentin (NEURONTIN)  300 MG capsule TAKE 1 CAPSULE (300 MG TOTAL) BY MOUTH AT BEDTIME. 30 capsule 1  . ketorolac (ACULAR) 0.5 % ophthalmic solution 1 DROP IN RIGHT EYE FOUR TIMES A DAY START 72 HOURS PRIOR TO SURGERY  1  . methocarbamol (ROBAXIN) 500 MG tablet Take 1 tablet (500 mg total) by mouth every 8 (eight) hours as needed for muscle spasms. 30 tablet 0   No facility-administered medications prior to visit.     ROS: Review of Systems  Constitutional: Negative for appetite change, fatigue and unexpected weight change.  HENT: Negative for congestion, nosebleeds, sneezing, sore throat and trouble swallowing.   Eyes: Positive for pain and visual disturbance. Negative for itching.  Respiratory: Negative for cough.   Cardiovascular: Negative for chest pain, palpitations and leg swelling.  Gastrointestinal: Negative for abdominal distention, blood in stool, diarrhea and nausea.  Genitourinary: Negative for frequency and hematuria.  Musculoskeletal: Positive for arthralgias and back pain. Negative for gait problem, joint swelling and neck pain.  Skin: Negative for rash.  Neurological: Negative for dizziness, tremors, speech difficulty and weakness.  Psychiatric/Behavioral: Negative for agitation, dysphoric mood and sleep disturbance. The patient is not nervous/anxious.     Objective:  BP 128/74 (BP Location: Left Arm, Patient Position: Sitting, Cuff Size: Normal)  Pulse 62   Temp 97.6 F (36.4 C) (Oral)   Ht 6' (1.829 m)   Wt 185 lb (83.9 kg)   SpO2 98%   BMI 25.09 kg/m   BP Readings from Last 3 Encounters:  11/23/17 128/74  10/08/17 132/68  10/01/17 138/60    Wt Readings from Last 3 Encounters:  11/23/17 185 lb (83.9 kg)  10/08/17 187 lb 1.3 oz (84.9 kg)  10/01/17 195 lb (88.5 kg)    Physical Exam  Constitutional: He is oriented to person, place, and time. He appears well-developed. No distress.  NAD  HENT:  Mouth/Throat: Oropharynx is clear and moist.  Eyes: Pupils are equal, round, and  reactive to light. Conjunctivae are normal.  Neck: Normal range of motion. No JVD present. No thyromegaly present.  Cardiovascular: Normal rate, regular rhythm, normal heart sounds and intact distal pulses. Exam reveals no gallop and no friction rub.  No murmur heard. Pulmonary/Chest: Effort normal and breath sounds normal. No respiratory distress. He has no wheezes. He has no rales. He exhibits no tenderness.  Abdominal: Soft. Bowel sounds are normal. He exhibits no distension and no mass. There is no tenderness. There is no rebound and no guarding.  Musculoskeletal: He exhibits tenderness. He exhibits no edema.  Lymphadenopathy:    He has no cervical adenopathy.  Neurological: He is alert and oriented to person, place, and time. He has normal reflexes. No cranial nerve deficit. He exhibits normal muscle tone. He displays a negative Romberg sign. Coordination and gait normal.  Skin: Skin is warm and dry. No rash noted.  Psychiatric: He has a normal mood and affect. His behavior is normal. Judgment and thought content normal.   LS tender  Lab Results  Component Value Date   WBC 6.2 07/26/2017   HGB 11.3 (L) 07/26/2017   HCT 33.2 (L) 07/26/2017   PLT 239.0 07/26/2017   GLUCOSE 103 (H) 07/31/2017   CHOL 136 01/12/2017   TRIG 67.0 01/12/2017   HDL 54.70 01/12/2017   LDLCALC 68 01/12/2017   ALT 11 07/31/2017   AST 17 07/31/2017   NA 140 07/31/2017   K 4.3 07/31/2017   CL 107 07/31/2017   CREATININE 1.16 07/31/2017   BUN 22 07/31/2017   CO2 26 07/31/2017   TSH 2.83 07/31/2017   PSA 0.47 08/30/2016   HGBA1C 5.6 07/28/2015    Ct Renal Stone Study  Result Date: 10/08/2017 CLINICAL DATA:  RIGHT flank pain for 10 days. EXAM: CT ABDOMEN AND PELVIS WITHOUT CONTRAST TECHNIQUE: Multidetector CT imaging of the abdomen and pelvis was performed following the standard protocol without IV contrast. COMPARISON:  None. FINDINGS: Lower chest: No acute abnormality. Hepatobiliary: No focal liver  abnormality is seen. Gallbladder is somewhat contracted with small stones. No biliary ductal dilatation. Pancreas: Unremarkable. No pancreatic ductal dilatation or surrounding inflammatory changes. Spleen: Normal in size without focal abnormality. Adrenals/Urinary Tract: No hydronephrosis. No hydroureter. Tiny calculus 1 mm size, RIGHT upper pole. Wedge-shaped defect RIGHT lateral kidney could represent an area of scarring due to previous infection. Stomach/Bowel: Stomach is within normal limits. Appendix appears normal. No evidence of bowel wall thickening, distention, or inflammatory changes. Diverticulosis without changes of diverticulitis. Multiple radiodensities within the bowel suggest ingested pills moderate stool burden. Vascular/Lymphatic: Aortic atherosclerosis. No enlarged abdominal or pelvic lymph nodes. Reproductive: Prostate is enlarged. Other: Small umbilical hernia containing only fat.  No ascites. Musculoskeletal: Lumbar spondylosis. Grade 1 slip L5-S1 secondary to pars defects. Multilevel disc space narrowing. IMPRESSION: RIGHT nephrolithiasis without  hydronephrosis. Cholelithiasis without signs of acute cholecystitis. Aortic Atherosclerosis (ICD10-I70.0). Electronically Signed   By: Staci Righter M.D.   On: 10/08/2017 15:32    Assessment & Plan:   There are no diagnoses linked to this encounter.   No orders of the defined types were placed in this encounter.    Follow-up: No follow-ups on file.  [name], MD

## 2017-11-23 NOTE — Assessment & Plan Note (Addendum)
Resolving on Rx Valtrex for ophth procedures

## 2017-11-23 NOTE — Assessment & Plan Note (Signed)
On B12 

## 2017-11-23 NOTE — Assessment & Plan Note (Signed)
Chronic  On Norco  Potential benefits of a long term opioids use as well as potential risks (i.e. addiction risk, apnea etc) and complications (i.e. Somnolence, constipation and others) were explained to the patient and were aknowledged. 

## 2017-11-23 NOTE — Assessment & Plan Note (Signed)
Hytrin 

## 2017-11-28 ENCOUNTER — Ambulatory Visit: Payer: PPO | Admitting: Internal Medicine

## 2017-11-29 ENCOUNTER — Other Ambulatory Visit: Payer: Self-pay | Admitting: Internal Medicine

## 2017-11-29 NOTE — Telephone Encounter (Signed)
Copied from [place] [id number]. Topic: Quick Communication - Rx Refill/Question >> Nov 29, 2017 12:51 PM Boyd Kerbs wrote: Medication:  meclizine (ANTIVERT) 12.5 MG tablet  Has the patient contacted their pharmacy? No. (Agent: If no, request that the patient contact the pharmacy for the refill.) (Agent: If yes, when and what did the pharmacy advise?)  Preferred Pharmacy (with phone number or street name):   [id number] - [place], [place] - [address] [place] [address] Phone: [phone] Fax: [phone]    Agent: Please be advised that RX refills may take up to 3 business days. We ask that you follow-up with your pharmacy.

## 2017-11-30 MED ORDER — MECLIZINE HCL 12.5 MG PO TABS: 12.5000 mg | ORAL_TABLET | Freq: Three times a day (TID) | ORAL | 1 refills | Status: DC | PRN

## 2017-11-30 NOTE — Telephone Encounter (Signed)
Rx request for long term medication last addressed: 07/31/17 : meclizine 12.5 mg  LOV: 11/23/17  PCP: Plot[place]  Pharmacy: verified

## 2017-11-30 NOTE — Telephone Encounter (Signed)
Reviewed chart pt is up-to-date sent refills to [facility].../lmb  

## 2017-12-05 DIAGNOSIS — H25812 Combined forms of age-related cataract, left eye: Secondary | ICD-10-CM | POA: Diagnosis not present

## 2017-12-05 DIAGNOSIS — H402222 Chronic angle-closure glaucoma, left eye, moderate stage: Secondary | ICD-10-CM | POA: Diagnosis not present

## 2017-12-05 DIAGNOSIS — H2512 Age-related nuclear cataract, left eye: Secondary | ICD-10-CM | POA: Diagnosis not present

## 2017-12-05 DIAGNOSIS — H401122 Primary open-angle glaucoma, left eye, moderate stage: Secondary | ICD-10-CM | POA: Diagnosis not present

## 2017-12-19 DIAGNOSIS — H353221 Exudative age-related macular degeneration, left eye, with active choroidal neovascularization: Secondary | ICD-10-CM | POA: Diagnosis not present

## 2017-12-19 DIAGNOSIS — H3562 Retinal hemorrhage, left eye: Secondary | ICD-10-CM | POA: Diagnosis not present

## 2017-12-19 DIAGNOSIS — H35012 Changes in retinal vascular appearance, left eye: Secondary | ICD-10-CM | POA: Diagnosis not present

## 2017-12-19 DIAGNOSIS — H43812 Vitreous degeneration, left eye: Secondary | ICD-10-CM | POA: Diagnosis not present

## 2018-01-16 DIAGNOSIS — H353221 Exudative age-related macular degeneration, left eye, with active choroidal neovascularization: Secondary | ICD-10-CM | POA: Diagnosis not present

## 2018-01-16 DIAGNOSIS — H353111 Nonexudative age-related macular degeneration, right eye, early dry stage: Secondary | ICD-10-CM | POA: Diagnosis not present

## 2018-01-16 DIAGNOSIS — H353121 Nonexudative age-related macular degeneration, left eye, early dry stage: Secondary | ICD-10-CM | POA: Diagnosis not present

## 2018-01-18 ENCOUNTER — Other Ambulatory Visit: Payer: Self-pay | Admitting: Internal Medicine

## 2018-01-22 DIAGNOSIS — H353221 Exudative age-related macular degeneration, left eye, with active choroidal neovascularization: Secondary | ICD-10-CM | POA: Diagnosis not present

## 2018-02-06 DIAGNOSIS — H353221 Exudative age-related macular degeneration, left eye, with active choroidal neovascularization: Secondary | ICD-10-CM | POA: Diagnosis not present

## 2018-02-06 DIAGNOSIS — Z09 Encounter for follow-up examination after completed treatment for conditions other than malignant neoplasm: Secondary | ICD-10-CM | POA: Diagnosis not present

## 2018-02-15 ENCOUNTER — Encounter: Payer: Self-pay | Admitting: Podiatry

## 2018-02-15 ENCOUNTER — Ambulatory Visit: Payer: PPO | Admitting: Podiatry

## 2018-02-15 DIAGNOSIS — G629 Polyneuropathy, unspecified: Secondary | ICD-10-CM

## 2018-02-15 DIAGNOSIS — B351 Tinea unguium: Secondary | ICD-10-CM | POA: Diagnosis not present

## 2018-02-15 DIAGNOSIS — M79675 Pain in left toe(s): Secondary | ICD-10-CM | POA: Diagnosis not present

## 2018-02-15 DIAGNOSIS — Q828 Other specified congenital malformations of skin: Secondary | ICD-10-CM | POA: Diagnosis not present

## 2018-02-15 DIAGNOSIS — M79674 Pain in right toe(s): Secondary | ICD-10-CM

## 2018-02-19 NOTE — Progress Notes (Signed)
Subjective: 77 year old male presents the office today for concerns of a painful callus to the left 2nd toe which is painful with pressure in shoes he also has nails that are elongated taking the other toes causing pain.  Denies any redness or drainage or swelling to either of these areas.  No other concerns.  No recent injury.  Overall his ankle is doing well from I saw him last.  He also states that his neuropathy is getting worse and he is on gabapentin for this.Denies any systemic complaints such as fevers, chills, nausea, vomiting. No acute changes since last appointment, and no other complaints at this time.   Objective: AAO x3, NAD DP/PT pulses palpable bilaterally, CRT less than 3 seconds Sensation grossly intact he does report increased neuropathy symptoms going up his leg and weakness. Hyperkeratotic lesion present left 2nd toe.  Upon debridement there is no underlying ulceration, drainage or any signs of infection.  The nails are hypertrophic, dystrophic, discolored, elongated x10 with tenderness nails 1-5 bilaterally.  No surrounding redness or drainage or any signs of infection. Hammertoe deformities present.  No open lesions or pre-ulcerative lesions.  No pain with calf compression, swelling, warmth, erythema  Assessment: Hyperkeratotic lesion left third toe with symptomatic onychomycosis  Plan: -All treatment options discussed with the patient including all alternatives, risks, complications.  -Keratotic lesion sharply debrided x1 without any complications or bleeding -Nails sharply debrided x10 without any complications or bleeding. -Continue current dose of gabapentin increase this with his primary care physician. -He will be on a regular schedule for trimming. Will have him come in with his wife as scheduled.  -Patient encouraged to call the office with any questions, concerns, change in symptoms.   Trula Slade DPM

## 2018-02-20 DIAGNOSIS — Z09 Encounter for follow-up examination after completed treatment for conditions other than malignant neoplasm: Secondary | ICD-10-CM | POA: Diagnosis not present

## 2018-02-20 DIAGNOSIS — H353121 Nonexudative age-related macular degeneration, left eye, early dry stage: Secondary | ICD-10-CM | POA: Diagnosis not present

## 2018-02-20 DIAGNOSIS — H353111 Nonexudative age-related macular degeneration, right eye, early dry stage: Secondary | ICD-10-CM | POA: Diagnosis not present

## 2018-02-20 DIAGNOSIS — H353221 Exudative age-related macular degeneration, left eye, with active choroidal neovascularization: Secondary | ICD-10-CM | POA: Diagnosis not present

## 2018-02-22 ENCOUNTER — Ambulatory Visit (INDEPENDENT_AMBULATORY_CARE_PROVIDER_SITE_OTHER): Payer: PPO | Admitting: Internal Medicine

## 2018-02-22 ENCOUNTER — Encounter: Payer: Self-pay | Admitting: Internal Medicine

## 2018-02-22 VITALS — BP 152/80 | HR 53 | Temp 98.2°F | Ht 72.0 in | Wt 191.0 lb

## 2018-02-22 DIAGNOSIS — M545 Low back pain, unspecified: Secondary | ICD-10-CM

## 2018-02-22 DIAGNOSIS — E559 Vitamin D deficiency, unspecified: Secondary | ICD-10-CM | POA: Diagnosis not present

## 2018-02-22 DIAGNOSIS — G8929 Other chronic pain: Secondary | ICD-10-CM | POA: Diagnosis not present

## 2018-02-22 DIAGNOSIS — G629 Polyneuropathy, unspecified: Secondary | ICD-10-CM | POA: Diagnosis not present

## 2018-02-22 DIAGNOSIS — I739 Peripheral vascular disease, unspecified: Secondary | ICD-10-CM | POA: Diagnosis not present

## 2018-02-22 DIAGNOSIS — E538 Deficiency of other specified B group vitamins: Secondary | ICD-10-CM

## 2018-02-22 MED ORDER — HYDROCODONE-ACETAMINOPHEN 7.5-325 MG PO TABS: 1.0000 | ORAL_TABLET | Freq: Four times a day (QID) | ORAL | 0 refills | Status: DC | PRN

## 2018-02-22 MED ORDER — GABAPENTIN 300 MG PO CAPS: 300.0000 mg | ORAL_CAPSULE | Freq: Every day | ORAL | 1 refills | Status: DC

## 2018-02-22 NOTE — Assessment & Plan Note (Signed)
On B12 

## 2018-02-22 NOTE — Assessment & Plan Note (Signed)
On Norco  Potential benefits of a long term opioids use as well as potential risks (i.e. addiction risk, apnea etc) and complications (i.e. Somnolence, constipation and others) were explained to the patient and were aknowledged.  

## 2018-02-22 NOTE — Assessment & Plan Note (Signed)
Gabapapentin

## 2018-02-22 NOTE — Progress Notes (Signed)
Subjective:  Patient ID: Justin [name] Grand., male    DOB: 02-05-1941  Age: 77 y.o. MRN: [id number]  CC: No chief complaint on file.   HPI [name] presents for chronic pain, B12 def, BPH, RLS f/u C/o claudication  Outpatient Medications Prior to Visit  Medication Sig Dispense Refill  . aspirin 81 MG EC tablet Take 81 mg by mouth daily.      . Cholecalciferol 1000 UNITS tablet Take 1,000 Units by mouth daily.      . Cyanocobalamin (VITAMIN B-12) 1000 MCG SUBL DISSOLVE 2 TABLETS IN MOUTH EVERY DAY 60 tablet 8  . finasteride (PROSCAR) 5 MG tablet TAKE 1 TABLET (5 MG TOTAL) BY MOUTH DAILY. 90 tablet 3  . gabapentin (NEURONTIN) 300 MG capsule TAKE 1 CAPSULE (300 MG TOTAL) BY MOUTH AT BEDTIME. 90 capsule 1  . HYDROcodone-acetaminophen (NORCO) 7.5-325 MG tablet Take 1 tablet by mouth every 6 (six) hours as needed for severe pain. Please fill on or after 01/31/18 120 tablet 0  . meclizine (ANTIVERT) 12.5 MG tablet Take 1 tablet (12.5 mg total) by mouth 3 (three) times daily as needed for dizziness. 60 tablet 1  . Multiple Vitamin (MULTIVITAMIN) capsule Take 1 capsule by mouth daily.      . Multiple Vitamins-Minerals (PRESERVISION AREDS PO) Take 2 tablets by mouth 2 (two) times daily.    [name] rOPINIRole (REQUIP) 2 MG tablet Take 2-3 tablets (4-6 mg total) by mouth at bedtime. 90 tablet 5  . tamsulosin (FLOMAX) 0.4 MG CAPS capsule TAKE ONE CAPSULE EVERY DAY 90 capsule 1  . terazosin (HYTRIN) 2 MG capsule TAKE 1 CAPSULE (2 MG TOTAL) BY MOUTH AT BEDTIME. 90 capsule 3  . dorzolamide (TRUSOPT) 2 % ophthalmic solution 1 drop daily.    [name] latanoprost (XALATAN) 0.005 % ophthalmic solution Place 1 drop into both eyes daily.    [name] ofloxacin (OCUFLOX) 0.3 % ophthalmic solution 1 DROP IN BOTH EYES FOUR TIMES A DAY START 72 HOURS PRIOR TO SURGERY  1  . prednisoLONE acetate (PRED FORTE) 1 % ophthalmic suspension 1 DROP IN RIGHT EYE FOUR TIMES A DAY START AFTER SURGERY  1   No facility-administered  medications prior to visit.     ROS: Review of Systems  Constitutional: Positive for fatigue. Negative for appetite change and unexpected weight change.  HENT: Negative for congestion, nosebleeds, sneezing, sore throat and trouble swallowing.   Eyes: Negative for itching and visual disturbance.  Respiratory: Negative for cough.   Cardiovascular: Negative for chest pain, palpitations and leg swelling.  Gastrointestinal: Negative for abdominal distention, blood in stool, diarrhea and nausea.  Genitourinary: Negative for frequency and hematuria.  Musculoskeletal: Positive for arthralgias, back pain and gait problem. Negative for joint swelling and neck pain.  Skin: Negative for rash.  Neurological: Negative for dizziness, tremors, speech difficulty and weakness.  Psychiatric/Behavioral: Positive for dysphoric mood. Negative for agitation, sleep disturbance and suicidal ideas. The patient is nervous/anxious.     Objective:  BP (!) 152/80 (BP Location: Right Arm, Patient Position: Sitting, Cuff Size: Normal)   Pulse (!) 53   Temp 98.2 F (36.8 C) (Oral)   Ht 6' (1.829 m)   Wt 191 lb (86.6 kg)   SpO2 98%   BMI 25.90 kg/m   BP Readings from Last 3 Encounters:  02/22/18 (!) 152/80  11/23/17 128/74  10/08/17 132/68    Wt Readings from Last 3 Encounters:  02/22/18 191 lb (86.6 kg)  11/23/17 185 lb (83.9  kg)  10/08/17 187 lb 1.3 oz (84.9 kg)    Physical Exam  Constitutional: He is oriented to person, place, and time. He appears well-developed. No distress.  NAD  HENT:  Mouth/Throat: Oropharynx is clear and moist.  Eyes: Pupils are equal, round, and reactive to light. Conjunctivae are normal.  Neck: Normal range of motion. No JVD present. No thyromegaly present.  Cardiovascular: Normal rate, regular rhythm, normal heart sounds and intact distal pulses. Exam reveals no gallop and no friction rub.  No murmur heard. Pulmonary/Chest: Effort normal and breath sounds normal. No  respiratory distress. He has no wheezes. He has no rales. He exhibits no tenderness.  Abdominal: Soft. Bowel sounds are normal. He exhibits no distension and no mass. There is no tenderness. There is no rebound and no guarding.  Musculoskeletal: Normal range of motion. He exhibits tenderness and deformity. He exhibits no edema.  Lymphadenopathy:    He has no cervical adenopathy.  Neurological: He is alert and oriented to person, place, and time. He has normal reflexes. No cranial nerve deficit. He exhibits normal muscle tone. He displays a negative Romberg sign. Coordination abnormal. Gait normal.  Skin: Skin is warm and dry. No rash noted.  Psychiatric: He has a normal mood and affect. His behavior is normal. Judgment and thought content normal.  bruises L hand deformed LS tender   Lab Results  Component Value Date   WBC 6.2 07/26/2017   HGB 11.3 (L) 07/26/2017   HCT 33.2 (L) 07/26/2017   PLT 239.0 07/26/2017   GLUCOSE 103 (H) 07/31/2017   CHOL 136 01/12/2017   TRIG 67.0 01/12/2017   HDL 54.70 01/12/2017   LDLCALC 68 01/12/2017   ALT 11 07/31/2017   AST 17 07/31/2017   NA 140 07/31/2017   K 4.3 07/31/2017   CL 107 07/31/2017   CREATININE 1.16 07/31/2017   BUN 22 07/31/2017   CO2 26 07/31/2017   TSH 2.83 07/31/2017   PSA 0.47 08/30/2016   HGBA1C 5.6 07/28/2015    Ct Renal Stone Study  Result Date: 10/08/2017 CLINICAL DATA:  RIGHT flank pain for 10 days. EXAM: CT ABDOMEN AND PELVIS WITHOUT CONTRAST TECHNIQUE: Multidetector CT imaging of the abdomen and pelvis was performed following the standard protocol without IV contrast. COMPARISON:  None. FINDINGS: Lower chest: No acute abnormality. Hepatobiliary: No focal liver abnormality is seen. Gallbladder is somewhat contracted with small stones. No biliary ductal dilatation. Pancreas: Unremarkable. No pancreatic ductal dilatation or surrounding inflammatory changes. Spleen: Normal in size without focal abnormality. Adrenals/Urinary  Tract: No hydronephrosis. No hydroureter. Tiny calculus 1 mm size, RIGHT upper pole. Wedge-shaped defect RIGHT lateral kidney could represent an area of scarring due to previous infection. Stomach/Bowel: Stomach is within normal limits. Appendix appears normal. No evidence of bowel wall thickening, distention, or inflammatory changes. Diverticulosis without changes of diverticulitis. Multiple radiodensities within the bowel suggest ingested pills moderate stool burden. Vascular/Lymphatic: Aortic atherosclerosis. No enlarged abdominal or pelvic lymph nodes. Reproductive: Prostate is enlarged. Other: Small umbilical hernia containing only fat.  No ascites. Musculoskeletal: Lumbar spondylosis. Grade 1 slip L5-S1 secondary to pars defects. Multilevel disc space narrowing. IMPRESSION: RIGHT nephrolithiasis without hydronephrosis. Cholelithiasis without signs of acute cholecystitis. Aortic Atherosclerosis (ICD10-I70.0). Electronically Signed   By: Staci Righter M.D.   On: 10/08/2017 15:32    Assessment & Plan:   There are no diagnoses linked to this encounter.   No orders of the defined types were placed in this encounter.    Follow-up: No follow-ups  on file.  [name], MD

## 2018-02-22 NOTE — Assessment & Plan Note (Signed)
Vit D 

## 2018-02-22 NOTE — Addendum Note (Signed)
Addended by: Karren Cobble on: 02/22/2018 10:52 AM   Modules accepted: Orders

## 2018-02-22 NOTE — Assessment & Plan Note (Signed)
?  neuro vs vascular Doppler US

## 2018-02-26 DIAGNOSIS — L570 Actinic keratosis: Secondary | ICD-10-CM | POA: Diagnosis not present

## 2018-02-26 DIAGNOSIS — Z85828 Personal history of other malignant neoplasm of skin: Secondary | ICD-10-CM | POA: Diagnosis not present

## 2018-02-26 DIAGNOSIS — L821 Other seborrheic keratosis: Secondary | ICD-10-CM | POA: Diagnosis not present

## 2018-02-26 DIAGNOSIS — D2371 Other benign neoplasm of skin of right lower limb, including hip: Secondary | ICD-10-CM | POA: Diagnosis not present

## 2018-02-26 DIAGNOSIS — D225 Melanocytic nevi of trunk: Secondary | ICD-10-CM | POA: Diagnosis not present

## 2018-02-28 ENCOUNTER — Ambulatory Visit (HOSPITAL_COMMUNITY)
Admission: RE | Admit: 2018-02-28 | Discharge: 2018-02-28 | Disposition: A | Discharge status: Home / Self Care | Payer: PPO | Source: Ambulatory Visit | Attending: Vascular Surgery | Admitting: Vascular Surgery

## 2018-02-28 ENCOUNTER — Ambulatory Visit (INDEPENDENT_AMBULATORY_CARE_PROVIDER_SITE_OTHER)
Admission: RE | Admit: 2018-02-28 | Discharge: 2018-02-28 | Disposition: A | Discharge status: Home / Self Care | Payer: PPO | Source: Ambulatory Visit | Attending: Vascular Surgery | Admitting: Vascular Surgery

## 2018-02-28 DIAGNOSIS — I739 Peripheral vascular disease, unspecified: Secondary | ICD-10-CM | POA: Insufficient documentation

## 2018-03-20 ENCOUNTER — Other Ambulatory Visit: Payer: Self-pay | Admitting: Internal Medicine

## 2018-03-20 DIAGNOSIS — H353221 Exudative age-related macular degeneration, left eye, with active choroidal neovascularization: Secondary | ICD-10-CM | POA: Diagnosis not present

## 2018-03-20 DIAGNOSIS — Z09 Encounter for follow-up examination after completed treatment for conditions other than malignant neoplasm: Secondary | ICD-10-CM | POA: Diagnosis not present

## 2018-03-20 DIAGNOSIS — H353121 Nonexudative age-related macular degeneration, left eye, early dry stage: Secondary | ICD-10-CM | POA: Diagnosis not present

## 2018-03-20 DIAGNOSIS — H3562 Retinal hemorrhage, left eye: Secondary | ICD-10-CM | POA: Diagnosis not present

## 2018-03-21 ENCOUNTER — Other Ambulatory Visit: Payer: Self-pay | Admitting: Internal Medicine

## 2018-04-03 ENCOUNTER — Ambulatory Visit (INDEPENDENT_AMBULATORY_CARE_PROVIDER_SITE_OTHER): Payer: PPO

## 2018-04-03 DIAGNOSIS — Z23 Encounter for immunization: Secondary | ICD-10-CM | POA: Diagnosis not present

## 2018-04-06 ENCOUNTER — Other Ambulatory Visit: Payer: Self-pay | Admitting: Internal Medicine

## 2018-04-10 ENCOUNTER — Telehealth: Payer: Self-pay | Admitting: Internal Medicine

## 2018-04-10 NOTE — Telephone Encounter (Signed)
Copied from [place] [id number]. Topic: Quick Communication - See Telephone Encounter >> Apr 10, 2018  2:34 PM Bea Graff, NT wrote: CRM for notification. See Telephone encounter for: 04/10/18. Patient states he was up to 4am this morning due to his restless legs. He states the rOPINIRole (REQUIP) 2 MG tablet does not seem to be working well for him anymore and would like to see if something else can be ordered. Please advise.

## 2018-04-11 NOTE — Telephone Encounter (Signed)
Please advise 

## 2018-04-12 MED ORDER — ROPINIROLE HCL 2 MG PO TABS: 2.0000 mg | ORAL_TABLET | Freq: Two times a day (BID) | ORAL | 5 refills | Status: DC

## 2018-04-12 NOTE — Telephone Encounter (Signed)
Pt.notified

## 2018-04-12 NOTE — Telephone Encounter (Signed)
Requip new Rx emailed: rebeat 1-2 tabs in a few hours if needed Thx

## 2018-04-15 DIAGNOSIS — H402211 Chronic angle-closure glaucoma, right eye, mild stage: Secondary | ICD-10-CM | POA: Diagnosis not present

## 2018-04-15 DIAGNOSIS — H402222 Chronic angle-closure glaucoma, left eye, moderate stage: Secondary | ICD-10-CM | POA: Diagnosis not present

## 2018-04-19 ENCOUNTER — Ambulatory Visit: Payer: PPO | Admitting: Podiatry

## 2018-04-19 ENCOUNTER — Encounter: Payer: Self-pay | Admitting: Podiatry

## 2018-04-19 DIAGNOSIS — G629 Polyneuropathy, unspecified: Secondary | ICD-10-CM

## 2018-04-19 DIAGNOSIS — B351 Tinea unguium: Secondary | ICD-10-CM | POA: Diagnosis not present

## 2018-04-19 DIAGNOSIS — M79675 Pain in left toe(s): Secondary | ICD-10-CM | POA: Diagnosis not present

## 2018-04-19 DIAGNOSIS — Q828 Other specified congenital malformations of skin: Secondary | ICD-10-CM | POA: Diagnosis not present

## 2018-04-19 DIAGNOSIS — M79674 Pain in right toe(s): Secondary | ICD-10-CM

## 2018-04-20 NOTE — Progress Notes (Signed)
Subjective: 77 y.o. returns the office today for painful, elongated, thickened toenails which he cannot trim himself. Denies any redness or drainage around the nails. Denies any acute changes since last appointment and no new complaints today. Denies any systemic complaints such as fevers, chills, nausea, vomiting.   PCP: Plotnikov, Evie Lacks, MD  Objective: AAO 3, NAD DP/PT pulses palpable, CRT less than 3 seconds Neurological status is unchanged.  Nails hypertrophic, dystrophic, elongated, brittle, discolored 10. There is tenderness overlying the nails 1-5 bilaterally. There is no surrounding erythema or drainage along the nail sites. Hyperkeratotic lesion right foot submetatarsal area. No underlying ulceration, drainage, or signs of infection.  No open lesions or pre-ulcerative lesions are identified. No other areas of tenderness bilateral lower extremities. No overlying edema, erythema, increased warmth. No pain with calf compression, swelling, warmth, erythema.  Assessment: Patient presents with symptomatic onychomycosis; hyperkeratotic lesion  Plan: -Treatment options including alternatives, risks, complications were discussed -Nails sharply debrided 10 without complication/bleeding. -Hyperkeratotic lesion sharply debrided x 1 without any complications or bleeding.  -Continue gabapentin for neuropathy  -Discussed daily foot inspection. If there are any changes, to call the office immediately.  -Follow-up in 3 months or sooner if any problems are to arise. In the meantime, encouraged to call the office with any questions, concerns, changes symptoms.  [name], DPM

## 2018-04-22 ENCOUNTER — Telehealth: Payer: Self-pay | Admitting: Internal Medicine

## 2018-04-22 ENCOUNTER — Other Ambulatory Visit: Payer: Self-pay | Admitting: *Deleted

## 2018-04-22 MED ORDER — ROPINIROLE HCL 2 MG PO TABS: 2.0000 mg | ORAL_TABLET | Freq: Two times a day (BID) | ORAL | 5 refills | Status: DC

## 2018-04-22 NOTE — Telephone Encounter (Signed)
Patient states Rx sent to wrong pharmacy- mail order. Rx reordered at [facility]. Patient notified.

## 2018-04-22 NOTE — Telephone Encounter (Signed)
Copied from [place] [id number]. Topic: Quick Communication - Rx Refill/Question >> Apr 22, 2018 10:01 AM Gardiner Ramus wrote: Medication:rOPINIRole (REQUIP) 2 MG tablet [[id number]]  pt called and stated that RX was sent to wrong pharmacy   Has the patient contacted their pharmacy? No  Preferred Pharmacy (with phone number or street name):[id number] - [place], [place] [address] [place] [phone] (Phone) [phone] (Fax)  Agent: Please be advised that RX refills may take up to 3 business days. We ask that you follow-up with your pharmacy.

## 2018-04-24 DIAGNOSIS — H353221 Exudative age-related macular degeneration, left eye, with active choroidal neovascularization: Secondary | ICD-10-CM | POA: Diagnosis not present

## 2018-04-24 DIAGNOSIS — H3562 Retinal hemorrhage, left eye: Secondary | ICD-10-CM | POA: Diagnosis not present

## 2018-04-24 DIAGNOSIS — H43812 Vitreous degeneration, left eye: Secondary | ICD-10-CM | POA: Diagnosis not present

## 2018-05-06 ENCOUNTER — Ambulatory Visit: Payer: PPO

## 2018-05-13 DIAGNOSIS — D2371 Other benign neoplasm of skin of right lower limb, including hip: Secondary | ICD-10-CM | POA: Diagnosis not present

## 2018-05-13 DIAGNOSIS — D2271 Melanocytic nevi of right lower limb, including hip: Secondary | ICD-10-CM | POA: Diagnosis not present

## 2018-05-13 DIAGNOSIS — L853 Xerosis cutis: Secondary | ICD-10-CM | POA: Diagnosis not present

## 2018-05-13 DIAGNOSIS — I788 Other diseases of capillaries: Secondary | ICD-10-CM | POA: Diagnosis not present

## 2018-05-13 DIAGNOSIS — L570 Actinic keratosis: Secondary | ICD-10-CM | POA: Diagnosis not present

## 2018-05-13 DIAGNOSIS — Z85828 Personal history of other malignant neoplasm of skin: Secondary | ICD-10-CM | POA: Diagnosis not present

## 2018-05-13 DIAGNOSIS — L738 Other specified follicular disorders: Secondary | ICD-10-CM | POA: Diagnosis not present

## 2018-05-13 DIAGNOSIS — D692 Other nonthrombocytopenic purpura: Secondary | ICD-10-CM | POA: Diagnosis not present

## 2018-05-13 DIAGNOSIS — C44311 Basal cell carcinoma of skin of nose: Secondary | ICD-10-CM | POA: Diagnosis not present

## 2018-05-13 DIAGNOSIS — L820 Inflamed seborrheic keratosis: Secondary | ICD-10-CM | POA: Diagnosis not present

## 2018-05-13 DIAGNOSIS — C44519 Basal cell carcinoma of skin of other part of trunk: Secondary | ICD-10-CM | POA: Diagnosis not present

## 2018-05-13 DIAGNOSIS — D485 Neoplasm of uncertain behavior of skin: Secondary | ICD-10-CM | POA: Diagnosis not present

## 2018-05-13 NOTE — Progress Notes (Addendum)
Subjective:   Justin [name]. is a 77 y.o. male who presents for Medicare Annual/Subsequent preventive examination.  Review of Systems:  No ROS.  Medicare Wellness Visit. Additional risk factors are reflected in the social history.  Cardiac Risk Factors include: advanced age (>64men, >6 women);hypertension Sleep patterns: gets up 1-2 times nightly to void and sleeps 5 hours nightly.  Patient reports insomnia issues, discussed recommended sleep tips and stress reduction tips.   Home Safety/Smoke Alarms: Feels safe in home. Smoke alarms in place.  Living environment; residence and Firearm Safety: 2-story house, no firearms. Lives with wife  no needs for DME, good support system Seat Belt Safety/Bike Helmet: Wears seat belt.     Objective:    Vitals: BP (!) 151/72   Pulse 69   Resp 17   Ht 6' (1.829 m)   Wt 193 lb (87.5 kg)   SpO2 99%   BMI 26.18 kg/m   Body mass index is 26.18 kg/m.  Advanced Directives 05/14/2018 05/03/2017 07/30/2015  Does Patient Have a Medical Advance Directive? No No Yes  Does patient want to make changes to medical advance directive? No - Patient declined - -  Would patient like information on creating a medical advance directive? - Yes (ED - Information included in AVS) -    Tobacco Social History   Tobacco Use  Smoking Status Former Smoker  . Packs/day: 3.00  . Years: 38.00  . Pack years: 114.00  . Types: Cigarettes  . Last attempt to quit: 08/30/1990  . Years since quitting: 27.7  Smokeless Tobacco Never Used  Tobacco Comment   states he quit 1986     Counseling given: Not Answered Comment: states he quit 1986  Past Medical History:  Diagnosis Date  . Alcoholism ([place])    Dry 13 years  . Basal cell carcinoma    Right Chest  . Chronic insomnia    On ambien x 2 years  . Depression   . Diverticulosis of colon   . Dizzy spells   . Elbow fracture, left 2009   [name]  . Glaucoma   . History of TIAs   . Hyperkeratosis   .  LBP (low back pain)   . Melanoma [facility]) 2009   x3 [name]  . Osteoarthritis   . Restless leg syndrome   . Tinnitus   . Tobacco abuse    Past Surgical History:  Procedure Laterality Date  . CATARACT EXTRACTION    . MELANOMA EXCISION  2009   x3  . RECONSTRUCTION MEDIAL COLLATERAL LIGAMENT ELBOW W/ TENDON GRAFT  2009   Left, [name]   Family History  Problem Relation Age of Onset  . Cancer Mother        ?breast  . Cancer Father        Lung   Social History   Socioeconomic History  . Marital status: Married    Spouse name: Not on file  . Number of children: 2  . Years of education: Not on file  . Highest education level: Not on file  Occupational History  . Occupation: [place] [employer]    Employer: RETIRED  . Occupation: Chiropodist  . Occupation: Heritage manager  Social Needs  . Financial resource strain: Not hard at all  . Food insecurity:    Worry: Never true    Inability: Never true  . Transportation needs:    Medical: No    Non-medical: No  Tobacco Use  .  Smoking status: Former Smoker    Packs/day: 3.00    Years: 38.00    Pack years: 114.00    Types: Cigarettes    Last attempt to quit: 08/30/1990    Years since quitting: 27.7  . Smokeless tobacco: Never Used  . Tobacco comment: states he quit 1986  Substance and Sexual Activity  . Alcohol use: No    Alcohol/week: 0.0 standard drinks    Frequency: Never    Comment: PREVIOUS - DRY 13 yrs  . Drug use: Not Currently  . Sexual activity: Not Currently  Lifestyle  . Physical activity:    Days per week: 0 days    Minutes per session: 0 min  . Stress: Not at all  Relationships  . Social connections:    Talks on phone: More than three times a week    Gets together: More than three times a week    Attends religious service: More than 4 times per year    Active member of club or organization: Yes    Attends meetings of clubs or organizations: More than 4 times per year     Relationship status: Married  Other Topics Concern  . Not on file  Social History Narrative  . Not on file    Outpatient Encounter Medications as of 05/14/2018  Medication Sig  . aspirin 81 MG EC tablet Take 81 mg by mouth daily.    . Cholecalciferol 1000 UNITS tablet Take 1,000 Units by mouth daily.    . Cyanocobalamin (VITAMIN B-12) 1000 MCG SUBL DISSOLVE 2 TABLETS IN MOUTH EVERY DAY  . finasteride (PROSCAR) 5 MG tablet TAKE 1 TABLET (5 MG TOTAL) BY MOUTH DAILY.  [name] gabapentin (NEURONTIN) 300 MG capsule TAKE 1 CAPSULE (300 MG TOTAL) BY MOUTH AT BEDTIME.  [name] HYDROcodone-acetaminophen (NORCO) 7.5-325 MG tablet Take 1 tablet by mouth every 6 (six) hours as needed for severe pain. Please fill on or after 05/03/18  . meclizine (ANTIVERT) 12.5 MG tablet TAKE 1 TABLET (12.5 MG TOTAL) BY MOUTH 3 (THREE) TIMES DAILY AS NEEDED FOR DIZZINESS.  . Multiple Vitamin (MULTIVITAMIN) capsule Take 1 capsule by mouth daily.    . Multiple Vitamins-Minerals (PRESERVISION AREDS PO) Take 2 tablets by mouth 2 (two) times daily.  [name] rOPINIRole (REQUIP) 2 MG tablet Take 1 tablet (2 mg total) by mouth 2 times daily at 12 noon and 4 pm.  . tamsulosin (FLOMAX) 0.4 MG CAPS capsule TAKE ONE CAPSULE EVERY DAY  . terazosin (HYTRIN) 2 MG capsule TAKE 1 CAPSULE (2 MG TOTAL) BY MOUTH AT BEDTIME.  . [DISCONTINUED] gabapentin (NEURONTIN) 300 MG capsule Take 1-2 capsules (300-600 mg total) by mouth at bedtime. (Patient not taking: Reported on 05/14/2018)   No facility-administered encounter medications on file as of 05/14/2018.     Activities of Daily Living In your present state of health, do you have any difficulty performing the following activities: 05/14/2018  Hearing? N  Vision? N  Difficulty concentrating or making decisions? N  Walking or climbing stairs? N  Dressing or bathing? N  Doing errands, shopping? N  Preparing Food and eating ? N  Using the Toilet? N  In the past six months, have you accidently leaked  urine? N  Do you have problems with loss of bowel control? N  Managing your Medications? N  Managing your Finances? N  Housekeeping or managing your Housekeeping? N  Some recent data might be hidden    Patient Care Team: Plotnikov, Evie Lacks, MD as PCP -  Cleda Daub, MD as Consulting Physician (Gastroenterology) Martinique, Amy, MD as Consulting Physician (Dermatology)   Assessment:   This is a routine wellness examination for [place]. Physical assessment deferred to PCP.   Exercise Activities and Dietary recommendations Current Exercise Habits: The patient does not participate in regular exercise at present(states he stays active doing house-hold chores and yard work), Exercise limited by: orthopedic condition(s) Diet (meal preparation, eat out, water intake, caffeinated beverages, dairy products, fruits and vegetables): in general, a "healthy" diet  , well balanced   Reviewed heart healthy diet. Encouraged patient to increase daily water and healthy fluid intake.   Goals      Patient Stated   . patient (pt-stated)     Wants to stay healthy;  Be with grand-children      Other   . Continue to be as active and as independent as possible    . Patient Stated     Stay healthy and independent        Fall Risk Fall Risk  05/14/2018 05/03/2017 11/29/2016 07/30/2015 01/14/2015  Falls in the past year? 1 No No No No  Number falls in past yr: 0 - - - -  Injury with Fall? 0 - - - -  Risk for fall due to : Impaired balance/gait - - - -  Follow up Falls prevention discussed - - - -   Depression Screen PHQ 2/9 Scores 05/14/2018 05/03/2017 11/30/2016 07/30/2015  PHQ - 2 Score 1 2 0 0  PHQ- 9 Score 4 3 7  -    Cognitive Function MMSE - Mini Mental State Exam 05/03/2017  Orientation to time 5  Orientation to Place 5  Registration 3  Attention/ Calculation 5  Recall 2  Language- name 2 objects 2  Language- repeat 1  Language- follow 3 step command 3  Language- read & follow  direction 1  Write a sentence 1  Copy design 1  Total score 29        Immunization History  Administered Date(s) Administered  . Influenza Split 03/30/2011, 05/09/2012  . Influenza Whole 04/22/2008, 03/31/2009  . Influenza, High Dose Seasonal PF 05/07/2013, 03/23/2016, 03/27/2017, 04/03/2018  . Influenza,inj,Quad PF,6+ Mos 04/07/2014, 04/29/2015  . Pneumococcal Conjugate-13 06/13/2013  . Pneumococcal Polysaccharide-23 10/27/2009  . Td 06/23/2010   Screening Tests Health Maintenance  Topic Date Due  . TETANUS/TDAP  06/23/2020  . INFLUENZA VACCINE  Completed  . PNA vac Low Risk Adult  Completed      Plan:     Continue doing brain stimulating activities (puzzles, reading, adult coloring books, staying active) to keep memory sharp.   Continue to eat heart healthy diet (full of fruits, vegetables, whole grains, lean protein, water--limit salt, fat, and sugar intake) and increase physical activity as tolerated.  I have personally reviewed and noted the following in the patient's chart:   . Medical and social history . Use of alcohol, tobacco or illicit drugs  . Current medications and supplements . Functional ability and status . Nutritional status . Physical activity . Advanced directives . List of other physicians . Vitals . Screenings to include cognitive, depression, and falls . Referrals and appointments  In addition, I have reviewed and discussed with patient certain preventive protocols, quality metrics, and best practice recommendations. A written personalized care plan for preventive services as well as general preventive health recommendations were provided to patient.     Michiel Cowboy, RN  05/15/2018  Medical screening examination/treatment/procedure(s) were performed by non-physician practitioner  and as supervising physician I was immediately available for consultation/collaboration. I agree with above. [name], MD

## 2018-05-14 ENCOUNTER — Ambulatory Visit (INDEPENDENT_AMBULATORY_CARE_PROVIDER_SITE_OTHER): Payer: PPO | Admitting: *Deleted

## 2018-05-14 VITALS — BP 151/72 | HR 69 | Resp 17 | Ht 72.0 in | Wt 193.0 lb

## 2018-05-14 DIAGNOSIS — Z0000 Encounter for general adult medical examination without abnormal findings: Secondary | ICD-10-CM | POA: Diagnosis not present

## 2018-05-14 NOTE — Patient Instructions (Addendum)
Continue doing brain stimulating activities (puzzles, reading, adult coloring books, staying active) to keep memory sharp.   Continue to eat heart healthy diet (full of fruits, vegetables, whole grains, lean protein, water--limit salt, fat, and sugar intake) and increase physical activity as tolerated.   Justin [name] , Thank you for taking time to come for your Medicare Wellness Visit. I appreciate your ongoing commitment to your health goals. Please review the following plan we discussed and let me know if I can assist you in the future.   These are the goals we discussed: Goals      Patient Stated   . patient (pt-stated)     Wants to stay healthy;  Be with grand-children      Other   . Continue to be as active and as independent as possible    . Patient Stated     Stay healthy and independent        This is a list of the screening recommended for you and due dates:  Health Maintenance  Topic Date Due  . Tetanus Vaccine  06/23/2020  . Flu Shot  Completed  . Pneumonia vaccines  Completed  \  Health Maintenance, Male A healthy lifestyle and preventive care is important for your health and wellness. Ask your health care provider about what schedule of regular examinations is right for you. What should I know about weight and diet? Eat a Healthy Diet  Eat plenty of vegetables, fruits, whole grains, low-fat dairy products, and lean protein.  Do not eat a lot of foods high in solid fats, added sugars, or salt.  Maintain a Healthy Weight Regular exercise can help you achieve or maintain a healthy weight. You should:  Do at least 150 minutes of exercise each week. The exercise should increase your heart rate and make you sweat (moderate-intensity exercise).  Do strength-training exercises at least twice a week.  Watch Your Levels of Cholesterol and Blood Lipids  Have your blood tested for lipids and cholesterol every 5 years starting at 79 years of age. If you are at high  risk for heart disease, you should start having your blood tested when you are 79 years old. You may need to have your cholesterol levels checked more often if: ? Your lipid or cholesterol levels are high. ? You are older than 79 years of age. ? You are at high risk for heart disease.  What should I know about cancer screening? Many types of cancers can be detected early and may often be prevented. Lung Cancer  You should be screened every year for lung cancer if: ? You are a current smoker who has smoked for at least 30 years. ? You are a former smoker who has quit within the past 15 years.  Talk to your health care provider about your screening options, when you should start screening, and how often you should be screened.  Colorectal Cancer  Routine colorectal cancer screening usually begins at 79 years of age and should be repeated every 5-10 years until you are 79 years old. You may need to be screened more often if early forms of precancerous polyps or small growths are found. Your health care provider may recommend screening at an earlier age if you have risk factors for colon cancer.  Your health care provider may recommend using home test kits to check for hidden blood in the stool.  A small camera at the end of a tube can be used to examine your  colon (sigmoidoscopy or colonoscopy). This checks for the earliest forms of colorectal cancer.  Prostate and Testicular Cancer  Depending on your age and overall health, your health care provider may do certain tests to screen for prostate and testicular cancer.  Talk to your health care provider about any symptoms or concerns you have about testicular or prostate cancer.  Skin Cancer  Check your skin from head to toe regularly.  Tell your health care provider about any new moles or changes in moles, especially if: ? There is a change in a mole's size, shape, or color. ? You have a mole that is larger than a pencil  eraser.  Always use sunscreen. Apply sunscreen liberally and repeat throughout the day.  Protect yourself by wearing long sleeves, pants, a wide-brimmed hat, and sunglasses when outside.  What should I know about heart disease, diabetes, and high blood pressure?  If you are 53-23 years of age, have your blood pressure checked every 3-5 years. If you are 32 years of age or older, have your blood pressure checked every year. You should have your blood pressure measured twice-once when you are at a hospital or clinic, and once when you are not at a hospital or clinic. Record the average of the two measurements. To check your blood pressure when you are not at a hospital or clinic, you can use: ? An automated blood pressure machine at a pharmacy. ? A home blood pressure monitor.  Talk to your health care provider about your target blood pressure.  If you are between 52-15 years old, ask your health care provider if you should take aspirin to prevent heart disease.  Have regular diabetes screenings by checking your fasting blood sugar level. ? If you are at a normal weight and have a low risk for diabetes, have this test once every three years after the age of 46. ? If you are overweight and have a high risk for diabetes, consider being tested at a younger age or more often.  A one-time screening for abdominal aortic aneurysm (AAA) by ultrasound is recommended for men aged 70-75 years who are current or former smokers. What should I know about preventing infection? Hepatitis B If you have a higher risk for hepatitis B, you should be screened for this virus. Talk with your health care provider to find out if you are at risk for hepatitis B infection. Hepatitis C Blood testing is recommended for:  Everyone born from [birth year] through 1967.  Anyone with known risk factors for hepatitis C.  Sexually Transmitted Diseases (STDs)  You should be screened each year for STDs including gonorrhea and  chlamydia if: ? You are sexually active and are younger than 79 years of age. ? You are older than 79 years of age and your health care provider tells you that you are at risk for this type of infection. ? Your sexual activity has changed since you were last screened and you are at an increased risk for chlamydia or gonorrhea. Ask your health care provider if you are at risk.  Talk with your health care provider about whether you are at high risk of being infected with HIV. Your health care provider may recommend a prescription medicine to help prevent HIV infection.  What else can I do?  Schedule regular health, dental, and eye exams.  Stay current with your vaccines (immunizations).  Do not use any tobacco products, such as cigarettes, chewing tobacco, and e-cigarettes. If you need help quitting,  ask your health care provider.  Limit alcohol intake to no more than 2 drinks per day. One drink equals 12 ounces of beer, 5 ounces of wine, or 1 ounces of hard liquor.  Do not use street drugs.  Do not share needles.  Ask your health care provider for help if you need support or information about quitting drugs.  Tell your health care provider if you often feel depressed.  Tell your health care provider if you have ever been abused or do not feel safe at home. This information is not intended to replace advice given to you by your health care provider. Make sure you discuss any questions you have with your health care provider. Document Released: 12/16/2007 Document Revised: 02/16/2016 Document Reviewed: 03/23/2015 Elsevier Interactive Patient Education  [employer].

## 2018-05-15 ENCOUNTER — Telehealth: Payer: Self-pay | Admitting: *Deleted

## 2018-05-15 MED ORDER — ROPINIROLE HCL 2 MG PO TABS: ORAL_TABLET | ORAL | 5 refills | Status: DC

## 2018-05-15 NOTE — Telephone Encounter (Signed)
OK Requip We'll discuss other rx at the next OV Thx

## 2018-05-15 NOTE — Telephone Encounter (Signed)
Called and LVM for patient to inform that [facility] [address] and that PCP will discuss flomax and proscar medications during his next visit with PCP.

## 2018-05-15 NOTE — Telephone Encounter (Signed)
During AWV, patient stated that he did not receive his prescription refill for Requip. He [facility] [address] and adds that he takes 1 pill at 12:00, 1 pill at 4:00, and 2 pills at HS.  Also, patient shared that flomax and proscar are not as effective as they use to be and asked if PCP could prescribe something else to replace them that may be more effective.

## 2018-05-17 ENCOUNTER — Telehealth: Payer: Self-pay | Admitting: Internal Medicine

## 2018-05-17 NOTE — Telephone Encounter (Signed)
Copied from [place] [id number]. Topic: Quick Communication - See Telephone Encounter >> May 17, 2018  1:36 PM Antonieta Iba C wrote: CRM for notification. See Telephone encounter for: 05/17/18.  Pt called in to ask if provider would approve him taking Carbidopa, pt says that it helps with restless leg syndrome OR 30MG  morphine? Pt says that his legs hurt and he needs relief. Pt says that he is currently taking medication for this but it is not helping.   Please advise further.

## 2018-05-17 NOTE — Telephone Encounter (Signed)
Please advise 

## 2018-05-19 NOTE — Telephone Encounter (Signed)
pls sch OV Thx 

## 2018-05-20 NOTE — Telephone Encounter (Signed)
Spoke with pt's son to inform that an appt was needed for medication. Pt has appt on 05/25/18 but can call back if he would like to be seen sooner.

## 2018-05-28 ENCOUNTER — Ambulatory Visit (INDEPENDENT_AMBULATORY_CARE_PROVIDER_SITE_OTHER): Payer: PPO | Admitting: Internal Medicine

## 2018-05-28 ENCOUNTER — Encounter: Payer: Self-pay | Admitting: Internal Medicine

## 2018-05-28 DIAGNOSIS — G2581 Restless legs syndrome: Secondary | ICD-10-CM | POA: Diagnosis not present

## 2018-05-28 DIAGNOSIS — E538 Deficiency of other specified B group vitamins: Secondary | ICD-10-CM | POA: Diagnosis not present

## 2018-05-28 DIAGNOSIS — I10 Essential (primary) hypertension: Secondary | ICD-10-CM

## 2018-05-28 DIAGNOSIS — N401 Enlarged prostate with lower urinary tract symptoms: Secondary | ICD-10-CM

## 2018-05-28 DIAGNOSIS — M79642 Pain in left hand: Secondary | ICD-10-CM | POA: Diagnosis not present

## 2018-05-28 MED ORDER — HYDROCODONE-ACETAMINOPHEN 7.5-325 MG PO TABS: 1.0000 | ORAL_TABLET | Freq: Four times a day (QID) | ORAL | 0 refills | Status: DC | PRN

## 2018-05-28 MED ORDER — CARBIDOPA-LEVODOPA 10-100 MG PO TABS: 1.0000 | ORAL_TABLET | Freq: Every day | ORAL | 5 refills | Status: DC

## 2018-05-28 NOTE — Assessment & Plan Note (Signed)
Worse Added Sinemet at HS

## 2018-05-28 NOTE — Assessment & Plan Note (Signed)
Hytrin 

## 2018-05-28 NOTE — Progress Notes (Signed)
Subjective:  Patient ID: Justin Pagan Su Grand., male    DOB: Nov 13, 1940  Age: 77 y.o. MRN: [id number]  CC: No chief complaint on file.   HPI Justin Basu [employer]. presents for worsening RLS sx's - worse, leg jerking in pm F/u chronic pain, BPH  Outpatient Medications Prior to Visit  Medication Sig Dispense Refill  . aspirin 81 MG EC tablet Take 81 mg by mouth daily.      . Cholecalciferol 1000 UNITS tablet Take 1,000 Units by mouth daily.      . Cyanocobalamin (VITAMIN B-12) 1000 MCG SUBL DISSOLVE 2 TABLETS IN MOUTH EVERY DAY 60 tablet 8  . finasteride (PROSCAR) 5 MG tablet TAKE 1 TABLET (5 MG TOTAL) BY MOUTH DAILY. 90 tablet 3  . gabapentin (NEURONTIN) 300 MG capsule TAKE 1 CAPSULE (300 MG TOTAL) BY MOUTH AT BEDTIME. 90 capsule 1  . HYDROcodone-acetaminophen (NORCO) 7.5-325 MG tablet Take 1 tablet by mouth every 6 (six) hours as needed for severe pain. Please fill on or after 05/03/18 120 tablet 0  . meclizine (ANTIVERT) 12.5 MG tablet TAKE 1 TABLET (12.5 MG TOTAL) BY MOUTH 3 (THREE) TIMES DAILY AS NEEDED FOR DIZZINESS. 60 tablet 1  . Multiple Vitamin (MULTIVITAMIN) capsule Take 1 capsule by mouth daily.      . Multiple Vitamins-Minerals (PRESERVISION AREDS PO) Take 2 tablets by mouth 2 (two) times daily.    [name] rOPINIRole (REQUIP) 2 MG tablet 1 pill at 12:00, 1 pill at 4:00, and 2 pills at HS 120 tablet 5  . tamsulosin (FLOMAX) 0.4 MG CAPS capsule TAKE ONE CAPSULE EVERY DAY 90 capsule 1  . terazosin (HYTRIN) 2 MG capsule TAKE 1 CAPSULE (2 MG TOTAL) BY MOUTH AT BEDTIME. 90 capsule 3   No facility-administered medications prior to visit.     ROS: Review of Systems  Constitutional: Negative for appetite change, fatigue and unexpected weight change.  HENT: Negative for congestion, nosebleeds, sneezing, sore throat and trouble swallowing.   Eyes: Negative for itching and visual disturbance.  Respiratory: Negative for cough.   Cardiovascular: Negative for chest pain, palpitations and  leg swelling.  Gastrointestinal: Negative for abdominal distention, blood in stool, diarrhea and nausea.  Genitourinary: Negative for frequency and hematuria.  Musculoskeletal: Positive for arthralgias, back pain and gait problem. Negative for joint swelling and neck pain.  Skin: Negative for rash.  Neurological: Positive for tremors. Negative for dizziness, seizures, speech difficulty and weakness.  Psychiatric/Behavioral: Negative for agitation, dysphoric mood, sleep disturbance and suicidal ideas. The patient is not nervous/anxious.     Objective:  BP 138/78 (BP Location: Left Arm, Patient Position: Sitting, Cuff Size: Normal)   Pulse (!) 50   Temp 98 F (36.7 C) (Oral)   Ht 6' (1.829 m)   Wt 195 lb (88.5 kg)   SpO2 99%   BMI 26.45 kg/m   BP Readings from Last 3 Encounters:  05/28/18 138/78  05/14/18 (!) 151/72  02/22/18 (!) 152/80    Wt Readings from Last 3 Encounters:  05/28/18 195 lb (88.5 kg)  05/14/18 193 lb (87.5 kg)  02/22/18 191 lb (86.6 kg)    Physical Exam  Constitutional: He is oriented to person, place, and time. He appears well-developed. No distress.  NAD  HENT:  Mouth/Throat: Oropharynx is clear and moist.  Eyes: Pupils are equal, round, and reactive to light. Conjunctivae are normal.  Neck: Normal range of motion. No JVD present. No thyromegaly present.  Cardiovascular: Normal rate, regular rhythm, normal heart sounds  and intact distal pulses. Exam reveals no gallop and no friction rub.  No murmur heard. Pulmonary/Chest: Effort normal and breath sounds normal. No respiratory distress. He has no wheezes. He has no rales. He exhibits no tenderness.  Abdominal: Soft. Bowel sounds are normal. He exhibits no distension and no mass. There is no tenderness. There is no rebound and no guarding.  Musculoskeletal: Normal range of motion. He exhibits tenderness. He exhibits no edema.  Lymphadenopathy:    He has no cervical adenopathy.  Neurological: He is alert  and oriented to person, place, and time. He has normal reflexes. No cranial nerve deficit. He exhibits normal muscle tone. He displays a negative Romberg sign. Coordination and gait normal.  Skin: Skin is warm and dry. No rash noted.  Psychiatric: He has a normal mood and affect. His behavior is normal. Judgment and thought content normal.  shoulders, elbows w/pain L hand w/painful contractures  Lab Results  Component Value Date   WBC 6.2 07/26/2017   HGB 11.3 (L) 07/26/2017   HCT 33.2 (L) 07/26/2017   PLT 239.0 07/26/2017   GLUCOSE 103 (H) 07/31/2017   CHOL 136 01/12/2017   TRIG 67.0 01/12/2017   HDL 54.70 01/12/2017   LDLCALC 68 01/12/2017   ALT 11 07/31/2017   AST 17 07/31/2017   NA 140 07/31/2017   K 4.3 07/31/2017   CL 107 07/31/2017   CREATININE 1.16 07/31/2017   BUN 22 07/31/2017   CO2 26 07/31/2017   TSH 2.83 07/31/2017   PSA 0.47 08/30/2016   HGBA1C 5.6 07/28/2015    Vas [place] Abi With/wo Tbi  Result Date: 03/01/2018 LOWER EXTREMITY DOPPLER STUDY Indications: Restless leg syndrome, pain with standing. High Risk Factors: Past history of smoking.  Comparison Study: No prior exam. Performing Technologist: Alvia Grove RVT  Examination Guidelines: A complete evaluation includes at minimum, Doppler waveform signals and systolic blood pressure reading at the level of bilateral brachial, anterior tibial, and posterior tibial arteries, when vessel segments are accessible. Bilateral testing is considered an integral part of a complete examination. Photoelectric Plethysmograph (PPG) waveforms and toe systolic pressure readings are included as required and additional duplex testing as needed. Limited examinations for reoccurring indications may be performed as noted.  ABI Findings: +---------+------------------+-----+---------+--------+ Right    Rt Pressure (mmHg)IndexWaveform Comment  +---------+------------------+-----+---------+--------+ Brachial 130                                       +---------+------------------+-----+---------+--------+ PTA      149               1.12 triphasic         +---------+------------------+-----+---------+--------+ DP       152               1.14 triphasic         +---------+------------------+-----+---------+--------+ Great Toe103               0.77 Normal            +---------+------------------+-----+---------+--------+ +---------+------------------+-----+---------+-------+ Left     Lt Pressure (mmHg)IndexWaveform Comment +---------+------------------+-----+---------+-------+ Brachial 133                                     +---------+------------------+-----+---------+-------+ PTA      148  1.11 triphasic        +---------+------------------+-----+---------+-------+ DP       156               1.17 triphasic        +---------+------------------+-----+---------+-------+ Great Toe117               0.88 Normal           +---------+------------------+-----+---------+-------+ +-------+-----------+-----------+------------+------------+ ABI/TBIToday's ABIToday's TBIPrevious ABIPrevious TBI +-------+-----------+-----------+------------+------------+ Right  1.14       0.77                                +-------+-----------+-----------+------------+------------+ Left   1.17       0.88                                +-------+-----------+-----------+------------+------------+  Final Interpretation: Right: Resting right ankle-brachial index is within normal range. No evidence of significant right lower extremity arterial disease. The right toe-brachial index is normal. Left: Resting left ankle-brachial index is within normal range. No evidence of significant left lower extremity arterial disease. The left toe-brachial index is normal.  *See table(s) above for measurements and observations.  Electronically signed by Ruta Hinds MD on 03/01/2018 at 11:03:47 AM.    Final    Vas [place] Lower  Extremity Arterial Duplex  Result Date: 03/01/2018 LOWER EXTREMITY ARTERIAL DUPLEX STUDY Indications: Restless leg syndrome, pain with standing. High Risk Factors: Past history of smoking.  Current ABI: Right 1.14 left 1.17 Comparison Study: No prior exam Performing Technologist: Alvia Grove RVT  Examination Guidelines: A complete evaluation includes B-mode imaging, spectral Doppler, color Doppler, and power Doppler as needed of all accessible portions of each vessel. Bilateral testing is considered an integral part of a complete examination. Limited examinations for reoccurring indications may be performed as noted.  Right Duplex Findings: +-----------+--------+-----+--------+---------+--------+            PSV cm/sRatioStenosisWaveform Comments +-----------+--------+-----+--------+---------+--------+ CFA Prox   121                  triphasic         +-----------+--------+-----+--------+---------+--------+ DFA        64                   triphasic         +-----------+--------+-----+--------+---------+--------+ SFA Prox   118                  triphasic         +-----------+--------+-----+--------+---------+--------+ SFA Mid    120                  triphasic         +-----------+--------+-----+--------+---------+--------+ SFA Distal 101                  triphasic         +-----------+--------+-----+--------+---------+--------+ POP Mid    103                  triphasic         +-----------+--------+-----+--------+---------+--------+ ATA Distal 37                   triphasic         +-----------+--------+-----+--------+---------+--------+ PTA Distal 82  triphasic         +-----------+--------+-----+--------+---------+--------+ PERO Distal82                   biphasic          +-----------+--------+-----+--------+---------+--------+  Left Duplex Findings: +-----------+--------+-----+--------+---------+------------------------+             PSV cm/sRatioStenosisWaveform Comments                 +-----------+--------+-----+--------+---------+------------------------+ CFA Prox   145                  triphasic                         +-----------+--------+-----+--------+---------+------------------------+ CFA Distal 120                  triphasic                         +-----------+--------+-----+--------+---------+------------------------+ DFA        88                   triphasic                         +-----------+--------+-----+--------+---------+------------------------+ SFA Prox   128                  triphasic                         +-----------+--------+-----+--------+---------+------------------------+ SFA Mid    113                  triphasic                         +-----------+--------+-----+--------+---------+------------------------+ SFA Distal 107                  triphasic                         +-----------+--------+-----+--------+---------+------------------------+ POP Mid    83                   triphasicnon flow reducing plaque +-----------+--------+-----+--------+---------+------------------------+ POP Distal 87                   triphasic                         +-----------+--------+-----+--------+---------+------------------------+ ATA Distal 38                   biphasic                          +-----------+--------+-----+--------+---------+------------------------+ PTA Distal 141                  triphasic                         +-----------+--------+-----+--------+---------+------------------------+ PERO Distal48                   triphasic                         +-----------+--------+-----+--------+---------+------------------------+  Final Interpretation: Right: Normal examination. No evidence of significant arterial occlusive disease. Left: Normal examination.  No evidence of significant arterial occlusive disease.  See table(s)  above for measurements and observations. Electronically signed by Ruta Hinds MD on 03/01/2018 at 11:03:23 AM.    Final     Assessment & Plan:   There are no diagnoses linked to this encounter.   No orders of the defined types were placed in this encounter.    Follow-up: No follow-ups on file.  [name], MD

## 2018-05-28 NOTE — Assessment & Plan Note (Signed)
On Vit B12 

## 2018-05-28 NOTE — Assessment & Plan Note (Signed)
On Norco  Potential benefits of a long term opioids use as well as potential risks (i.e. addiction risk, apnea etc) and complications (i.e. Somnolence, constipation and others) were explained to the patient and were aknowledged.  

## 2018-05-28 NOTE — Assessment & Plan Note (Signed)
Urol ref if worse

## 2018-05-28 NOTE — Patient Instructions (Signed)

## 2018-06-05 DIAGNOSIS — H3562 Retinal hemorrhage, left eye: Secondary | ICD-10-CM | POA: Diagnosis not present

## 2018-06-05 DIAGNOSIS — H35012 Changes in retinal vascular appearance, left eye: Secondary | ICD-10-CM | POA: Diagnosis not present

## 2018-06-05 DIAGNOSIS — H353221 Exudative age-related macular degeneration, left eye, with active choroidal neovascularization: Secondary | ICD-10-CM | POA: Diagnosis not present

## 2018-06-05 DIAGNOSIS — H43812 Vitreous degeneration, left eye: Secondary | ICD-10-CM | POA: Diagnosis not present

## 2018-06-19 DIAGNOSIS — C44311 Basal cell carcinoma of skin of nose: Secondary | ICD-10-CM | POA: Diagnosis not present

## 2018-06-19 DIAGNOSIS — Z85828 Personal history of other malignant neoplasm of skin: Secondary | ICD-10-CM | POA: Diagnosis not present

## 2018-06-21 ENCOUNTER — Encounter: Payer: Self-pay | Admitting: Podiatry

## 2018-06-21 ENCOUNTER — Ambulatory Visit: Payer: PPO | Admitting: Podiatry

## 2018-06-21 DIAGNOSIS — B351 Tinea unguium: Secondary | ICD-10-CM | POA: Diagnosis not present

## 2018-06-21 DIAGNOSIS — M79674 Pain in right toe(s): Secondary | ICD-10-CM

## 2018-06-21 DIAGNOSIS — M2062 Acquired deformities of toe(s), unspecified, left foot: Secondary | ICD-10-CM | POA: Diagnosis not present

## 2018-06-21 DIAGNOSIS — M79675 Pain in left toe(s): Secondary | ICD-10-CM | POA: Diagnosis not present

## 2018-06-21 DIAGNOSIS — G629 Polyneuropathy, unspecified: Secondary | ICD-10-CM | POA: Diagnosis not present

## 2018-06-23 NOTE — Progress Notes (Signed)
Subjective: 77 y.o. returns the office today for painful, elongated, thickened toenails which he cannot trim himself. Denies any redness or drainage around the nails.  He also states that the left third toe is very tender at the tip of the toe as it is curled and he puts a lot of pressure to the tip of the toe is asked to vision and that he can do.  He is tried offloading padding without any significant resolution.  Denies any acute changes since last appointment and no new complaints today. Denies any systemic complaints such as fevers, chills, nausea, vomiting.   PCP: Plotnikov, Evie Lacks, MD  Objective: AAO 3, NAD DP/PT pulses palpable, CRT less than 3 seconds Neurological status is unchanged.  Nails hypertrophic, dystrophic, elongated, brittle, discolored 10. There is tenderness overlying the nails 1-5 bilaterally. There is no surrounding erythema or drainage along the nail sites. There is deformity present the left third toe and the toe is rigid contracture at the level of the DIPJ.  He is putting a lot of pressure of the tip of the toe and there is some erythema to the tip of the toe from pressure.  This is not appear to be an infection.  Subjectively is getting a lot of pain to the tip of the toe as well. No other areas of tenderness bilateral lower extremities. No overlying edema, erythema, increased warmth. No pain with calf compression, swelling, warmth, erythema.  Assessment: Patient presents with symptomatic onychomycosis; left third toe deformity  Plan: -Treatment options including alternatives, risks, complications were discussed -Nails sharply debrided 10 without complication/bleeding. -Regards to the left third toe we discussed options.  He is attempted offloading the insignificant improvement.  We discussed possible surgical intervention.  We discussed options including trying to straighten the toe with wire fixation or an amputation of the distal portion of the toe at the  level of the DIPJ.  Given the recovery he likely want to do the amputation at the DIPJ.  Will check insurance coverage and let him know.  He will consider his options. -Continue gabapentin for neuropathy  -Discussed daily foot inspection. If there are any changes, to call the office immediately.  -Follow-up in 9 weeks or sooner if any problems are to arise. In the meantime, encouraged to call the office with any questions, concerns, changes symptoms.  [name], DPM

## 2018-07-01 ENCOUNTER — Other Ambulatory Visit: Payer: Self-pay | Admitting: Internal Medicine

## 2018-07-01 DIAGNOSIS — E785 Hyperlipidemia, unspecified: Secondary | ICD-10-CM

## 2018-07-01 NOTE — Progress Notes (Signed)
Ct calcium

## 2018-07-23 ENCOUNTER — Ambulatory Visit (INDEPENDENT_AMBULATORY_CARE_PROVIDER_SITE_OTHER)
Admission: RE | Admit: 2018-07-23 | Discharge: 2018-07-23 | Disposition: A | Discharge status: Home / Self Care | Payer: Self-pay | Source: Ambulatory Visit | Attending: Internal Medicine | Admitting: Internal Medicine

## 2018-07-23 DIAGNOSIS — E785 Hyperlipidemia, unspecified: Secondary | ICD-10-CM

## 2018-07-24 DIAGNOSIS — H401132 Primary open-angle glaucoma, bilateral, moderate stage: Secondary | ICD-10-CM | POA: Diagnosis not present

## 2018-07-24 DIAGNOSIS — H353111 Nonexudative age-related macular degeneration, right eye, early dry stage: Secondary | ICD-10-CM | POA: Diagnosis not present

## 2018-07-24 DIAGNOSIS — H353121 Nonexudative age-related macular degeneration, left eye, early dry stage: Secondary | ICD-10-CM | POA: Diagnosis not present

## 2018-07-24 DIAGNOSIS — H353221 Exudative age-related macular degeneration, left eye, with active choroidal neovascularization: Secondary | ICD-10-CM | POA: Diagnosis not present

## 2018-07-25 ENCOUNTER — Telehealth: Payer: Self-pay | Admitting: Internal Medicine

## 2018-07-25 NOTE — Telephone Encounter (Signed)
Patient would like the small dose of [facility]astatin [facility] [address]

## 2018-07-25 NOTE — Telephone Encounter (Signed)
Copied from [place] [phone]. Topic: Quick Communication - See Telephone Encounter >> Jul 25, 2018  2:23 PM Blase Mess A wrote: CRM for notification. See Telephone encounter for: 07/25/18.  Patient is calling to receive the result of his chest scan. Please advise [phone]

## 2018-07-26 MED ORDER — PRAVASTATIN SODIUM 20 MG PO TABS: 20.0000 mg | ORAL_TABLET | Freq: Every day | ORAL | 11 refills | Status: DC

## 2018-07-26 NOTE — Telephone Encounter (Signed)
Okay. Thank you.

## 2018-08-01 DIAGNOSIS — H402222 Chronic angle-closure glaucoma, left eye, moderate stage: Secondary | ICD-10-CM | POA: Diagnosis not present

## 2018-08-01 DIAGNOSIS — H16111 Macular keratitis, right eye: Secondary | ICD-10-CM | POA: Diagnosis not present

## 2018-08-01 DIAGNOSIS — H402211 Chronic angle-closure glaucoma, right eye, mild stage: Secondary | ICD-10-CM | POA: Diagnosis not present

## 2018-08-01 DIAGNOSIS — Z961 Presence of intraocular lens: Secondary | ICD-10-CM | POA: Diagnosis not present

## 2018-08-06 ENCOUNTER — Other Ambulatory Visit: Payer: Self-pay | Admitting: Internal Medicine

## 2018-08-13 DIAGNOSIS — D692 Other nonthrombocytopenic purpura: Secondary | ICD-10-CM | POA: Diagnosis not present

## 2018-08-13 DIAGNOSIS — L570 Actinic keratosis: Secondary | ICD-10-CM | POA: Diagnosis not present

## 2018-08-13 DIAGNOSIS — Z85828 Personal history of other malignant neoplasm of skin: Secondary | ICD-10-CM | POA: Diagnosis not present

## 2018-08-13 DIAGNOSIS — D1801 Hemangioma of skin and subcutaneous tissue: Secondary | ICD-10-CM | POA: Diagnosis not present

## 2018-08-13 DIAGNOSIS — L910 Hypertrophic scar: Secondary | ICD-10-CM | POA: Diagnosis not present

## 2018-08-13 DIAGNOSIS — D2371 Other benign neoplasm of skin of right lower limb, including hip: Secondary | ICD-10-CM | POA: Diagnosis not present

## 2018-08-13 DIAGNOSIS — L853 Xerosis cutis: Secondary | ICD-10-CM | POA: Diagnosis not present

## 2018-08-19 ENCOUNTER — Ambulatory Visit: Payer: PPO | Admitting: Podiatry

## 2018-08-19 ENCOUNTER — Encounter: Payer: Self-pay | Admitting: Podiatry

## 2018-08-19 DIAGNOSIS — M79674 Pain in right toe(s): Secondary | ICD-10-CM

## 2018-08-19 DIAGNOSIS — Q828 Other specified congenital malformations of skin: Secondary | ICD-10-CM | POA: Diagnosis not present

## 2018-08-19 DIAGNOSIS — B351 Tinea unguium: Secondary | ICD-10-CM

## 2018-08-19 DIAGNOSIS — G629 Polyneuropathy, unspecified: Secondary | ICD-10-CM

## 2018-08-19 DIAGNOSIS — M79675 Pain in left toe(s): Secondary | ICD-10-CM | POA: Diagnosis not present

## 2018-08-20 NOTE — Progress Notes (Signed)
Subjective: 78 y.o. returns the office today for painful, elongated, thickened toenails which he cannot trim himself. He gets a callus to the tip of the left 2nd toe. Denies any redness, drainage or signs of infection. He wants to hold off on the surgery to the left 3rd toe at this time. Denies any systemic complaints such as fevers, chills, nausea, vomiting.   PCP: Plotnikov, Evie Lacks, MD  Objective: AAO 3, NAD DP/PT pulses palpable, CRT less than 3 seconds Neurological status is unchanged.  Nails hypertrophic, dystrophic, elongated, brittle, discolored 10. There is tenderness overlying the nails 1-5 bilaterally. There is no surrounding erythema or drainage along the nail sites. Hyperkeratotic lesion sharply debrided x 1 without any complications or bleeding.  There is deformity present the left third toe and the toe is rigid contracture at the level of the DIPJ.  No open sores, swelling. No other areas of tenderness bilateral lower extremities. No overlying edema, erythema, increased warmth. Hammertoes present.  No pain with calf compression, swelling, warmth, erythema.  Assessment: Patient presents with symptomatic onychomycosis; hyperkeratotic lesion; left third toe deformity  Plan: -Treatment options including alternatives, risks, complications were discussed -Nails sharply debrided 10 without complication/bleeding. -Hyperkeratotic lesion sharply debrided x 1 without any complications or bleeding. Continue offloading to the toes.  -Continue gabapentin for neuropathy  -Discussed daily foot inspection. If there are any changes, to call the office immediately.  -Follow-up in 9 weeks or sooner if any problems are to arise. In the meantime, encouraged to call the office with any questions, concerns, changes symptoms.  [name], DPM

## 2018-08-23 ENCOUNTER — Ambulatory Visit: Payer: PPO | Admitting: Podiatry

## 2018-08-26 ENCOUNTER — Other Ambulatory Visit: Payer: Self-pay

## 2018-08-26 ENCOUNTER — Ambulatory Visit: Payer: PPO | Admitting: Internal Medicine

## 2018-08-28 MED ORDER — HYDROCODONE-ACETAMINOPHEN 7.5-325 MG PO TABS: 1.0000 | ORAL_TABLET | Freq: Four times a day (QID) | ORAL | 0 refills | Status: DC | PRN

## 2018-08-28 NOTE — Progress Notes (Unsigned)
Ok Thx 

## 2018-08-29 DIAGNOSIS — H353221 Exudative age-related macular degeneration, left eye, with active choroidal neovascularization: Secondary | ICD-10-CM | POA: Diagnosis not present

## 2018-08-29 DIAGNOSIS — H3562 Retinal hemorrhage, left eye: Secondary | ICD-10-CM | POA: Diagnosis not present

## 2018-08-29 DIAGNOSIS — H35012 Changes in retinal vascular appearance, left eye: Secondary | ICD-10-CM | POA: Diagnosis not present

## 2018-08-29 DIAGNOSIS — H43812 Vitreous degeneration, left eye: Secondary | ICD-10-CM | POA: Diagnosis not present

## 2018-08-29 DIAGNOSIS — H353121 Nonexudative age-related macular degeneration, left eye, early dry stage: Secondary | ICD-10-CM | POA: Diagnosis not present

## 2018-09-02 ENCOUNTER — Ambulatory Visit: Payer: PPO | Admitting: Internal Medicine

## 2018-09-11 ENCOUNTER — Other Ambulatory Visit: Payer: Self-pay | Admitting: Internal Medicine

## 2018-09-25 ENCOUNTER — Other Ambulatory Visit: Payer: Self-pay | Admitting: Internal Medicine

## 2018-09-25 ENCOUNTER — Ambulatory Visit: Payer: PPO | Admitting: Internal Medicine

## 2018-09-26 DIAGNOSIS — H353221 Exudative age-related macular degeneration, left eye, with active choroidal neovascularization: Secondary | ICD-10-CM | POA: Diagnosis not present

## 2018-09-26 DIAGNOSIS — H3562 Retinal hemorrhage, left eye: Secondary | ICD-10-CM | POA: Diagnosis not present

## 2018-09-26 DIAGNOSIS — H43812 Vitreous degeneration, left eye: Secondary | ICD-10-CM | POA: Diagnosis not present

## 2018-09-30 ENCOUNTER — Other Ambulatory Visit: Payer: Self-pay | Admitting: Internal Medicine

## 2018-09-30 NOTE — Telephone Encounter (Signed)
Copied from [place] [id number]. Topic: Quick Communication - Rx Refill/Question >> Sep 30, 2018 12:00 PM Dimple Nanas, RN wrote: Medication: Hydrocodone-acetaminophen 7.5-325 mg  Has the patient contacted their pharmacy? No. (Agent: If no, request that the patient contact the pharmacy for the refill.) (Agent: If yes, when and what did the pharmacy advise?)  Preferred Pharm[facility] (with phone number [facility] street [facility] [address] Please be advised that RX refills may take up to 3 business days. We ask that you follow-up with your pharmacy.

## 2018-10-03 MED ORDER — HYDROCODONE-ACETAMINOPHEN 7.5-325 MG PO TABS: 1.0000 | ORAL_TABLET | Freq: Four times a day (QID) | ORAL | 0 refills | Status: DC | PRN

## 2018-10-21 ENCOUNTER — Ambulatory Visit: Payer: PPO | Admitting: Podiatry

## 2018-10-24 ENCOUNTER — Encounter: Payer: Self-pay | Admitting: Internal Medicine

## 2018-10-24 ENCOUNTER — Ambulatory Visit (INDEPENDENT_AMBULATORY_CARE_PROVIDER_SITE_OTHER): Payer: PPO | Admitting: Internal Medicine

## 2018-10-24 DIAGNOSIS — G2581 Restless legs syndrome: Secondary | ICD-10-CM

## 2018-10-24 DIAGNOSIS — G8929 Other chronic pain: Secondary | ICD-10-CM

## 2018-10-24 DIAGNOSIS — I10 Essential (primary) hypertension: Secondary | ICD-10-CM

## 2018-10-24 DIAGNOSIS — G629 Polyneuropathy, unspecified: Secondary | ICD-10-CM

## 2018-10-24 DIAGNOSIS — E538 Deficiency of other specified B group vitamins: Secondary | ICD-10-CM | POA: Diagnosis not present

## 2018-10-24 DIAGNOSIS — I2510 Atherosclerotic heart disease of native coronary artery without angina pectoris: Secondary | ICD-10-CM

## 2018-10-24 DIAGNOSIS — M545 Low back pain, unspecified: Secondary | ICD-10-CM

## 2018-10-24 DIAGNOSIS — R5383 Other fatigue: Secondary | ICD-10-CM | POA: Diagnosis not present

## 2018-10-24 MED ORDER — GABAPENTIN 300 MG PO CAPS: 300.0000 mg | ORAL_CAPSULE | Freq: Four times a day (QID) | ORAL | 1 refills | Status: DC

## 2018-10-24 MED ORDER — LORATADINE 10 MG PO TABS: 10.0000 mg | ORAL_TABLET | Freq: Every day | ORAL | 11 refills | Status: DC

## 2018-10-24 MED ORDER — HYDROCODONE-ACETAMINOPHEN 7.5-325 MG PO TABS: 1.0000 | ORAL_TABLET | Freq: Four times a day (QID) | ORAL | 0 refills | Status: DC | PRN

## 2018-10-24 NOTE — Assessment & Plan Note (Signed)
On B12 

## 2018-10-24 NOTE — Assessment & Plan Note (Signed)
mild CAD according to cardiac calcium score of 41.  However, more plaque is noted in the main artery (mid-LAD).  Pravastatin, baby aspirin

## 2018-10-24 NOTE — Assessment & Plan Note (Signed)
Exercise.  Exercise for balance

## 2018-10-24 NOTE — Assessment & Plan Note (Signed)
Chronic  On Norco  Potential benefits of a long term opioids use as well as potential risks (i.e. addiction risk, apnea etc) and complications (i.e. Somnolence, constipation and others) were explained to the patient and were aknowledged. 

## 2018-10-24 NOTE — Assessment & Plan Note (Signed)
Will try gabapentin 300 mg breakfast, 300 mg lunch, 600 mg bedtime

## 2018-10-24 NOTE — Assessment & Plan Note (Signed)
Continue with Hytrin

## 2018-10-24 NOTE — Progress Notes (Signed)
Virtual Visit via Telephone Note  I connected with Justin [name]. on 10/24/18 at  9:10 AM EDT by telephone and verified that I am speaking with the correct person using two identifiers.   I discussed the limitations, risks, security and privacy concerns of performing an evaluation and management service by telephone and the availability of in person appointments. I also discussed with the patient that there may be a patient responsible charge related to this service. The patient expressed understanding and agreed to proceed.   History of Present Illness:   F/u RLS, chronic pain, CAD f/u The patient is complaining of more problems with restless legs.  Requip has helped initially.  The patient does not think Sinemet is helping.  Gabapentin may be helping a little. Observations/Objective: The patient is no acute distress.  He looks very well  Assessment and Plan:  See plan Follow Up Instructions:    I discussed the assessment and treatment plan with the patient. The patient was provided an opportunity to ask questions and all were answered. The patient agreed with the plan and demonstrated an understanding of the instructions.   The patient was advised to call back or seek an in-person evaluation if the symptoms worsen or if the condition fails to improve as anticipated.  I provided 20 minutes of non-face-to-face time during this encounter.   [name], MD

## 2018-10-30 DIAGNOSIS — H353221 Exudative age-related macular degeneration, left eye, with active choroidal neovascularization: Secondary | ICD-10-CM | POA: Diagnosis not present

## 2018-10-30 DIAGNOSIS — H353111 Nonexudative age-related macular degeneration, right eye, early dry stage: Secondary | ICD-10-CM | POA: Diagnosis not present

## 2018-10-30 DIAGNOSIS — H35012 Changes in retinal vascular appearance, left eye: Secondary | ICD-10-CM | POA: Diagnosis not present

## 2018-10-30 DIAGNOSIS — H353121 Nonexudative age-related macular degeneration, left eye, early dry stage: Secondary | ICD-10-CM | POA: Diagnosis not present

## 2018-10-30 DIAGNOSIS — H3562 Retinal hemorrhage, left eye: Secondary | ICD-10-CM | POA: Diagnosis not present

## 2018-10-30 DIAGNOSIS — H43392 Other vitreous opacities, left eye: Secondary | ICD-10-CM | POA: Diagnosis not present

## 2018-11-08 ENCOUNTER — Other Ambulatory Visit: Payer: Self-pay

## 2018-11-08 ENCOUNTER — Ambulatory Visit (INDEPENDENT_AMBULATORY_CARE_PROVIDER_SITE_OTHER): Payer: PPO | Admitting: Internal Medicine

## 2018-11-08 ENCOUNTER — Encounter: Payer: Self-pay | Admitting: Internal Medicine

## 2018-11-08 DIAGNOSIS — I2510 Atherosclerotic heart disease of native coronary artery without angina pectoris: Secondary | ICD-10-CM | POA: Diagnosis not present

## 2018-11-08 DIAGNOSIS — L03114 Cellulitis of left upper limb: Secondary | ICD-10-CM

## 2018-11-08 DIAGNOSIS — I10 Essential (primary) hypertension: Secondary | ICD-10-CM

## 2018-11-08 MED ORDER — SULFAMETHOXAZOLE-TRIMETHOPRIM 800-160 MG PO TABS: 1.0000 | ORAL_TABLET | Freq: Two times a day (BID) | ORAL | 0 refills | Status: DC

## 2018-11-08 NOTE — Progress Notes (Signed)
Patient ID: Justin Fabiano Su Grand., male   DOB: [birth year], 78 y.o.   MRN: [id number]  Virtual Visit via Video Note  I connected with [place]. on 11/08/18 at  2:00 PM EDT by a video enabled telemedicine application and verified that I am speaking with the correct person using two identifiers.  Location: Patient: at home with wife next to him for assist Provider: at [facility]   I discussed the limitations of evaluation and management by telemedicine and the availability of in person appointments. The patient expressed understanding and agreed to proceed.  History of Present Illness: Here to f/u with c/o 2 days onset gradually worsening now moderate constant sharp red pain swelling to post left had a puncture wound type to post hand near wrist.  Tender to touch, better to not do this.  No fever, red streaks or drainage. No hx of MRSA.  Pt denies chest pain, increased sob or doe, wheezing, orthopnea, PND, increased LE swelling, palpitations, dizziness or syncope.  Pt denies new neurological symptoms such as new headache, or facial or extremity weakness or numbness   Pt denies polydipsia, polyuria  Has not checked BP at home recently but plans to check. Past Medical History:  Diagnosis Date  . Alcoholism ([place])    Dry 13 years  . Basal cell carcinoma    Right Chest  . Chronic insomnia    On ambien x 2 years  . Depression   . Diverticulosis of colon   . Dizzy spells   . Elbow fracture, left 2009   [name]  . Glaucoma   . History of TIAs   . Hyperkeratosis   . LBP (low back pain)   . Melanoma [facility]) 2009   x3 [name]  . Osteoarthritis   . Restless leg syndrome   . Tinnitus   . Tobacco abuse    Past Surgical History:  Procedure Laterality Date  . CATARACT EXTRACTION    . MELANOMA EXCISION  2009   x3  . RECONSTRUCTION MEDIAL COLLATERAL LIGAMENT ELBOW W/ TENDON GRAFT  2009   Left, [name]    reports that he quit smoking about 28 years ago. His smoking use included  cigarettes. He has a 114.00 pack-year smoking history. He has never used smokeless tobacco. He reports previous drug use. He reports that he does not drink alcohol. family history includes Cancer in his father and mother. Allergies  Allergen Reactions  . Levofloxacin     REACTION: hands pealed   Current Outpatient Medications on File Prior to Visit  Medication Sig Dispense Refill  . aspirin 81 MG EC tablet Take 81 mg by mouth daily.      . Cholecalciferol 1000 UNITS tablet Take 1,000 Units by mouth daily.      . Cyanocobalamin (VITAMIN B-12) 1000 MCG SUBL DISSOLVE 2 TABLETS IN MOUTH EVERY DAY 60 tablet 8  . finasteride (PROSCAR) 5 MG tablet TAKE 1 TABLET (5 MG TOTAL) BY MOUTH DAILY. 90 tablet 3  . gabapentin (NEURONTIN) 300 MG capsule Take 1 capsule (300 mg total) by mouth 4 (four) times daily. 360 capsule 1  . HYDROcodone-acetaminophen (NORCO) 7.5-325 MG tablet Take 1 tablet by mouth every 6 (six) hours as needed for severe pain. Please fill on or after 04/31/20 120 tablet 0  . HYDROcodone-acetaminophen (NORCO) 7.5-325 MG tablet Take 1 tablet by mouth every 6 (six) hours as needed for moderate pain. 120 tablet 0  . loratadine (CLARITIN) 10 MG tablet Take 1  tablet (10 mg total) by mouth daily. 30 tablet 11  . meclizine (ANTIVERT) 12.5 MG tablet TAKE 1 TABLET (12.5 MG TOTAL) BY MOUTH 3 (THREE) TIMES DAILY AS NEEDED FOR DIZZINESS. 60 tablet 1  . Multiple Vitamin (MULTIVITAMIN) capsule Take 1 capsule by mouth daily.      . Multiple Vitamins-Minerals (PRESERVISION AREDS PO) Take 2 tablets by mouth 2 (two) times daily.    . pravastatin (PRAVACHOL) 20 MG tablet Take 1 tablet (20 mg total) by mouth daily. 30 tablet 11  . rOPINIRole (REQUIP) 2 MG tablet 1 pill at 12:00, 1 pill at 4:00, and 2 pills at HS 120 tablet 5  . tamsulosin (FLOMAX) 0.4 MG CAPS capsule TAKE ONE CAPSULE EVERY DAY 90 capsule 3  . terazosin (HYTRIN) 2 MG capsule TAKE 1 CAPSULE (2 MG TOTAL) BY MOUTH AT BEDTIME. 90 capsule 3   No  current facility-administered medications on file prior to visit.     Observations/Objective: Alert, NAD, appropriate mood and affect, resps normal, cn 2-12 intact, moves all 4s, has visible 1-2+ red, tender swelling of post left hand from wrist to MCPs with small dark puncture wound near wrist, no drainage or red streaks Lab Results  Component Value Date   WBC 6.2 07/26/2017   HGB 11.3 (L) 07/26/2017   HCT 33.2 (L) 07/26/2017   PLT 239.0 07/26/2017   GLUCOSE 103 (H) 07/31/2017   CHOL 136 01/12/2017   TRIG 67.0 01/12/2017   HDL 54.70 01/12/2017   LDLCALC 68 01/12/2017   ALT 11 07/31/2017   AST 17 07/31/2017   NA 140 07/31/2017   K 4.3 07/31/2017   CL 107 07/31/2017   CREATININE 1.16 07/31/2017   BUN 22 07/31/2017   CO2 26 07/31/2017   TSH 2.83 07/31/2017   PSA 0.47 08/30/2016   HGBA1C 5.6 07/28/2015   Assessment and Plan: See notes  Follow Up Instructions: See notes   I discussed the assessment and treatment plan with the patient. The patient was provided an opportunity to ask questions and all were answered. The patient agreed with the plan and demonstrated an understanding of the instructions.   The patient was advised to call back or seek an in-person evaluation if the symptoms worsen or if the condition fails to improve as anticipated.  Cathlean Cower, MD

## 2018-11-09 ENCOUNTER — Encounter: Payer: Self-pay | Admitting: Internal Medicine

## 2018-11-09 DIAGNOSIS — L03114 Cellulitis of left upper limb: Secondary | ICD-10-CM | POA: Insufficient documentation

## 2018-11-09 NOTE — Patient Instructions (Signed)
Please take all new medication as prescribed - the antibiotic  Please go to ED as soon as tomorrow for any worsening fever, redness, swelling or red streaks or drainage  Please continue all other medications as before, and refills have been done if requested.  Please have the pharmacy call with any other refills you may need.  Please continue your efforts at being more active, low cholesterol diet, and weight control.  Please keep your appointments with your specialists as you may have planned

## 2018-11-09 NOTE — Assessment & Plan Note (Signed)
stable overall by history and exam, recent data reviewed with pt, and pt to continue medical treatment as before,  to f/u any worsening symptoms or concerns  

## 2018-11-09 NOTE — Assessment & Plan Note (Signed)
Mild to mod, for septra course,  to f/u any worsening symptoms or concerns by going to ED for any onset worsening or fever, drainage, red streaks

## 2018-11-09 NOTE — Assessment & Plan Note (Signed)
Urged to continue to monitor BP at home with goal < 140/90

## 2018-11-11 ENCOUNTER — Ambulatory Visit: Payer: Self-pay | Admitting: Internal Medicine

## 2018-11-11 ENCOUNTER — Ambulatory Visit (INDEPENDENT_AMBULATORY_CARE_PROVIDER_SITE_OTHER): Payer: PPO | Admitting: Internal Medicine

## 2018-11-11 ENCOUNTER — Other Ambulatory Visit: Payer: Self-pay | Admitting: Internal Medicine

## 2018-11-11 DIAGNOSIS — J309 Allergic rhinitis, unspecified: Secondary | ICD-10-CM | POA: Diagnosis not present

## 2018-11-11 DIAGNOSIS — R079 Chest pain, unspecified: Secondary | ICD-10-CM

## 2018-11-11 DIAGNOSIS — L03114 Cellulitis of left upper limb: Secondary | ICD-10-CM | POA: Diagnosis not present

## 2018-11-11 DIAGNOSIS — K219 Gastro-esophageal reflux disease without esophagitis: Secondary | ICD-10-CM | POA: Diagnosis not present

## 2018-11-11 MED ORDER — TRIAMCINOLONE ACETONIDE 55 MCG/ACT NA AERO: 2.0000 | INHALATION_SPRAY | Freq: Every day | NASAL | 12 refills | Status: DC

## 2018-11-11 NOTE — Telephone Encounter (Signed)
Opened this by accident.  See chart for triage notes.

## 2018-11-11 NOTE — Telephone Encounter (Signed)
Pt called in c/o "wheezing yesterday and all night".    "My chest is not hurting".   " I pulled weeds last Wednesday in my yard". " I'm hurting under my rib cage on both sides".  I did a video visit with Dr. Alain Marion and he put me on Bactrim DS for infection in my left hand.   Maybe the medicine is causing me to wheeze or to hurt under my rib cage I don't know what is causing all of this.  I'm not wheezing this morning.   Denies shortness of breath or chest pain.  See triage notes.  I warm transferred his call to Sam in the office for scheduling.   (Dr. Alain Marion is out of the office all week).  COVID-19 pandemic so virtual visits are being done.  I sent my notes to Dr. Judeen Hammans office.   Reason for Disposition . [1] MILD difficulty breathing (e.g., minimal/no SOB at rest, SOB with walking, pulse <100) AND [2] NEW-onset or WORSE than normal    I'm think I'm have acid reflux.   I'm using Tums.   I'm know I'm vague but that's best way I know to describe  Answer Assessment - Initial Assessment Questions 1. RESPIRATORY STATUS: "Describe your breathing?" (e.g., wheezing, shortness of breath, unable to speak, severe coughing)      At the bottom of my rib cage I hurt.  It's on the right the most but the left also hurts.     I don't know if it's from working in the yard.  That wore me out.     2. ONSET: "When did this breathing problem begin?"      This started Saturday.    I'm Bactrim DS for an infected hand.   I didn't know if it was from the medicine he gave me.   I worked in the yard Wednesday but I started hurting on Saturday. 3. PATTERN "Does the difficult breathing come and go, or has it been constant since it started?"      I'm not wheezing now.   I wheezed all night and yesterday.   No fever.  I checked it.  4. SEVERITY: "How bad is your breathing?" (e.g., mild, moderate, severe)    - MILD: No SOB at rest, mild SOB with walking, speaks normally in sentences, can lay down, no retractions,  pulse < 100.    - MODERATE: SOB at rest, SOB with minimal exertion and prefers to sit, cannot lie down flat, speaks in phrases, mild retractions, audible wheezing, pulse 100-120.    - SEVERE: Very SOB at rest, speaks in single words, struggling to breathe, sitting hunched forward, retractions, pulse > 120      I'm fine now. 5. RECURRENT SYMPTOM: "Have you had difficulty breathing before?" If so, ask: "When was the last time?" and "What happened that time?"      It may have happened sometime during my 77 years.   I quit smoking a long time ago. 6. CARDIAC HISTORY: "Do you have any history of heart disease?" (e.g., heart attack, angina, bypass surgery, angioplasty)      "I had a CT scan" in January.   He told me I had bad arteries. 7. LUNG HISTORY: "Do you have any history of lung disease?"  (e.g., pulmonary embolus, asthma, emphysema)     I take Zyrtec for allergies. 8. CAUSE: "What do you think is causing the breathing problem?"      I hurt right below my rib  cage like a muscle.   If I get in the wrong position it hurts. 9. OTHER SYMPTOMS: "Do you have any other symptoms? (e.g., dizziness, runny nose, cough, chest pain, fever)     "I'm just getting old" 10. PREGNANCY: "Is there any chance you are pregnant?" "When was your last menstrual period?"       N/A 11. TRAVEL: "Have you traveled out of the country in the last month?" (e.g., travel history, exposures)       No travels.   I've been staying home due to this coronavirus stay at home order.  Protocols used: BREATHING DIFFICULTY-A-AH

## 2018-11-13 ENCOUNTER — Encounter: Payer: Self-pay | Admitting: Internal Medicine

## 2018-11-13 DIAGNOSIS — K219 Gastro-esophageal reflux disease without esophagitis: Secondary | ICD-10-CM | POA: Insufficient documentation

## 2018-11-13 DIAGNOSIS — R079 Chest pain, unspecified: Secondary | ICD-10-CM | POA: Insufficient documentation

## 2018-11-13 NOTE — Progress Notes (Signed)
Patient ID: Justin Cullens Su Grand., male   DOB: 02/05/1941, 78 y.o.   MRN: [id number]  Virtual Visit via Video Note  I connected with Justin [name]. on 11/13/18 at  1:40 PM EDT by a video enabled telemedicine application and verified that I am speaking with the correct person using two identifiers.  Location: Patient: at home with wife present Provider: at [facility]   I discussed the limitations of evaluation and management by telemedicine and the availability of in person appointments. The patient expressed understanding and agreed to proceed.  History of Present Illness: Here to f/u recent eVisit for left post hand cellulitis and he is quite pleased to show his hand has resolved red, tender, swelling.  Does have several wks ongoing nasal allergy symptoms with clearish congestion, itch and sneezing, without fever, pain, ST, swelling or wheezing except for "wheezing in the nose".  Also has occasional non prod cough worse to lie down at night, assoc with a sore tender area at the right lower chest mid costal margin, without swelling, redness or rash.   Pt denies fever, wt loss, night sweats, loss of appetite, or other constitutional symptoms .  Has some mild reflux but seems ok with TUMS prn. Denies worsening abd pain, dysphagia, n/v, bowel change or blood. Past Medical History:  Diagnosis Date  . Alcoholism ([place])    Dry 13 years  . Basal cell carcinoma    Right Chest  . Chronic insomnia    On ambien x 2 years  . Depression   . Diverticulosis of colon   . Dizzy spells   . Elbow fracture, left 2009   [name]  . Glaucoma   . History of TIAs   . Hyperkeratosis   . LBP (low back pain)   . Melanoma [facility]) 2009   x3 [name]  . Osteoarthritis   . Restless leg syndrome   . Tinnitus   . Tobacco abuse    Past Surgical History:  Procedure Laterality Date  . CATARACT EXTRACTION    . MELANOMA EXCISION  2009   x3  . RECONSTRUCTION MEDIAL COLLATERAL LIGAMENT ELBOW W/ TENDON GRAFT   2009   Left, [name]    reports that he quit smoking about 28 years ago. His smoking use included cigarettes. He has a 114.00 pack-year smoking history. He has never used smokeless tobacco. He reports previous drug use. He reports that he does not drink alcohol. family history includes Cancer in his father and mother. Allergies  Allergen Reactions  . Levofloxacin     REACTION: hands pealed   Current Outpatient Medications on File Prior to Visit  Medication Sig Dispense Refill  . aspirin 81 MG EC tablet Take 81 mg by mouth daily.      . Cholecalciferol 1000 UNITS tablet Take 1,000 Units by mouth daily.      . Cyanocobalamin (VITAMIN B-12) 1000 MCG SUBL DISSOLVE 2 TABLETS IN MOUTH EVERY DAY 60 tablet 8  . finasteride (PROSCAR) 5 MG tablet TAKE 1 TABLET (5 MG TOTAL) BY MOUTH DAILY. 90 tablet 3  . gabapentin (NEURONTIN) 300 MG capsule Take 1 capsule (300 mg total) by mouth 4 (four) times daily. 360 capsule 1  . HYDROcodone-acetaminophen (NORCO) 7.5-325 MG tablet Take 1 tablet by mouth every 6 (six) hours as needed for severe pain. Please fill on or after 04/31/20 120 tablet 0  . HYDROcodone-acetaminophen (NORCO) 7.5-325 MG tablet Take 1 tablet by mouth every 6 (six) hours as needed for  moderate pain. 120 tablet 0  . loratadine (CLARITIN) 10 MG tablet Take 1 tablet (10 mg total) by mouth daily. 30 tablet 11  . meclizine (ANTIVERT) 12.5 MG tablet TAKE 1 TABLET (12.5 MG TOTAL) BY MOUTH 3 (THREE) TIMES DAILY AS NEEDED FOR DIZZINESS. 60 tablet 1  . Multiple Vitamin (MULTIVITAMIN) capsule Take 1 capsule by mouth daily.      . Multiple Vitamins-Minerals (PRESERVISION AREDS PO) Take 2 tablets by mouth 2 (two) times daily.    . pravastatin (PRAVACHOL) 20 MG tablet Take 1 tablet (20 mg total) by mouth daily. 30 tablet 11  . rOPINIRole (REQUIP) 2 MG tablet 1 pill at 12:00, 1 pill at 4:00, and 2 pills at HS 120 tablet 5  . sulfamethoxazole-trimethoprim (BACTRIM DS) 800-160 MG tablet Take 1 tablet by mouth  2 (two) times daily. 20 tablet 0  . tamsulosin (FLOMAX) 0.4 MG CAPS capsule TAKE ONE CAPSULE EVERY DAY 90 capsule 3  . terazosin (HYTRIN) 2 MG capsule TAKE ONE CAPSULE BY MOUTH AT BEDTIME 90 capsule 3   No current facility-administered medications on file prior to visit.     Observations/Objective: Alert, NAD, appropriate mood and affect, resps normal, cn 2-12 intact, moves all 4s, no visible rash or swelling Lab Results  Component Value Date   WBC 6.2 07/26/2017   HGB 11.3 (L) 07/26/2017   HCT 33.2 (L) 07/26/2017   PLT 239.0 07/26/2017   GLUCOSE 103 (H) 07/31/2017   CHOL 136 01/12/2017   TRIG 67.0 01/12/2017   HDL 54.70 01/12/2017   LDLCALC 68 01/12/2017   ALT 11 07/31/2017   AST 17 07/31/2017   NA 140 07/31/2017   K 4.3 07/31/2017   CL 107 07/31/2017   CREATININE 1.16 07/31/2017   BUN 22 07/31/2017   CO2 26 07/31/2017   TSH 2.83 07/31/2017   PSA 0.47 08/30/2016   HGBA1C 5.6 07/28/2015   Assessment and Plan: See notes  Follow Up Instructions: Seen notes   I discussed the assessment and treatment plan with the patient. The patient was provided an opportunity to ask questions and all were answered. The patient agreed with the plan and demonstrated an understanding of the instructions.   The patient was advised to call back or seek an in-person evaluation if the symptoms worsen or if the condition fails to improve as anticipated.   Cathlean Cower, MD

## 2018-11-13 NOTE — Patient Instructions (Signed)
Please take all new medication as prescribed - the nasacort as directed  OK for tylenol as needed for pain, and TUMS as needed for reflux.    Please continue all other medications as before, including finishing the antibiotic  Please have the pharmacy call with any other refills you may need.  Please keep your appointments with your specialists as you may have planned

## 2018-11-13 NOTE — Assessment & Plan Note (Signed)
Mild uncontrolled, to add nasacort asd,  to f/u any worsening symptoms or concerns

## 2018-11-13 NOTE — Assessment & Plan Note (Signed)
stable overall by history and exam, recent data reviewed with pt, and pt to continue medical treatment as before,  to f/u any worsening symptoms or concerns  

## 2018-11-13 NOTE — Assessment & Plan Note (Signed)
C/ msk pain, for tylenol prn, and control cough

## 2018-11-13 NOTE — Assessment & Plan Note (Signed)
Resolved,  to f/u any worsening symptoms or concerns, finish current antibx

## 2018-11-18 ENCOUNTER — Telehealth: Payer: Self-pay | Admitting: *Deleted

## 2018-11-18 DIAGNOSIS — R0781 Pleurodynia: Secondary | ICD-10-CM

## 2018-11-18 NOTE — Telephone Encounter (Signed)
Copied from [place] [id number]. Topic: General - Other >> Nov 18, 2018  9:05 AM Carolyn Stare wrote:  Pt call to say he is still having that rib pain and is asking if u may need an xray to see what is going on >> Nov 18, 2018  9:22 AM Para Skeans A wrote: Saw Dr.John on 5/11 for this.

## 2018-11-18 NOTE — Telephone Encounter (Signed)
Patient's son informed of below. He states he will let patient know.

## 2018-11-18 NOTE — Telephone Encounter (Signed)
Ok for cxr

## 2018-11-19 ENCOUNTER — Ambulatory Visit (INDEPENDENT_AMBULATORY_CARE_PROVIDER_SITE_OTHER)
Admission: RE | Admit: 2018-11-19 | Discharge: 2018-11-19 | Disposition: A | Discharge status: Home / Self Care | Payer: PPO | Source: Ambulatory Visit | Attending: Internal Medicine | Admitting: Internal Medicine

## 2018-11-19 ENCOUNTER — Telehealth: Payer: Self-pay | Admitting: *Deleted

## 2018-11-19 ENCOUNTER — Other Ambulatory Visit: Payer: Self-pay

## 2018-11-19 DIAGNOSIS — R0781 Pleurodynia: Secondary | ICD-10-CM | POA: Diagnosis not present

## 2018-11-19 DIAGNOSIS — D2261 Melanocytic nevi of right upper limb, including shoulder: Secondary | ICD-10-CM | POA: Diagnosis not present

## 2018-11-19 DIAGNOSIS — L570 Actinic keratosis: Secondary | ICD-10-CM | POA: Diagnosis not present

## 2018-11-19 DIAGNOSIS — D2371 Other benign neoplasm of skin of right lower limb, including hip: Secondary | ICD-10-CM | POA: Diagnosis not present

## 2018-11-19 DIAGNOSIS — D1801 Hemangioma of skin and subcutaneous tissue: Secondary | ICD-10-CM | POA: Diagnosis not present

## 2018-11-19 DIAGNOSIS — D225 Melanocytic nevi of trunk: Secondary | ICD-10-CM | POA: Diagnosis not present

## 2018-11-19 DIAGNOSIS — R079 Chest pain, unspecified: Secondary | ICD-10-CM | POA: Diagnosis not present

## 2018-11-19 DIAGNOSIS — L821 Other seborrheic keratosis: Secondary | ICD-10-CM | POA: Diagnosis not present

## 2018-11-19 DIAGNOSIS — Z85828 Personal history of other malignant neoplasm of skin: Secondary | ICD-10-CM | POA: Diagnosis not present

## 2018-11-19 DIAGNOSIS — D692 Other nonthrombocytopenic purpura: Secondary | ICD-10-CM | POA: Diagnosis not present

## 2018-11-19 NOTE — Telephone Encounter (Signed)
Pt informed of below: See 11/19/18 CXR.  IMPRESSION: No active cardiopulmonary disease.

## 2018-11-26 ENCOUNTER — Encounter: Payer: Self-pay | Admitting: Podiatry

## 2018-11-26 ENCOUNTER — Ambulatory Visit: Payer: PPO | Admitting: Podiatry

## 2018-11-26 ENCOUNTER — Other Ambulatory Visit: Payer: Self-pay

## 2018-11-26 VITALS — Temp 97.5°F

## 2018-11-26 DIAGNOSIS — G629 Polyneuropathy, unspecified: Secondary | ICD-10-CM

## 2018-11-26 DIAGNOSIS — M79674 Pain in right toe(s): Secondary | ICD-10-CM | POA: Diagnosis not present

## 2018-11-26 DIAGNOSIS — M79675 Pain in left toe(s): Secondary | ICD-10-CM

## 2018-11-26 DIAGNOSIS — B351 Tinea unguium: Secondary | ICD-10-CM | POA: Diagnosis not present

## 2018-11-27 ENCOUNTER — Other Ambulatory Visit: Payer: Self-pay | Admitting: Internal Medicine

## 2018-11-27 NOTE — Progress Notes (Signed)
Subjective: 78 y.o. returns the office today for painful, elongated, thickened toenails which he cannot trim himself. He gets a callus to the tip of the left 2nd toe. Denies any redness, drainage or signs of infection. He wants to hold off on the surgery to the left 3rd toe at this time due to the pandemic but the toe is still causing quite a bit of discomfort he wants to proceed with a partial amputation of the toe in the future.. Denies any systemic complaints such as fevers, chills, nausea, vomiting.   PCP: Plotnikov, Evie Lacks, MD  Objective: AAO 3, NAD DP/PT pulses palpable, CRT less than 3 seconds Neurological status is unchanged.  Nails hypertrophic, dystrophic, elongated, brittle, discolored 10. There is tenderness overlying the nails 1-5 bilaterally. There is no surrounding erythema or drainage along the nail sites. Hyperkeratotic lesion sharply debrided x 1 without any complications or bleeding.  There is deformity present the left third toe and the toe is rigid contracture at the level of the DIPJ.  No open sores, swelling. No other areas of tenderness bilateral lower extremities. No overlying edema, erythema, increased warmth. Hammertoes present.  No pain with calf compression, swelling, warmth, erythema.  Assessment: Patient presents with symptomatic onychomycosis; hyperkeratotic lesion; left third toe deformity  Plan: -Treatment options including alternatives, risks, complications were discussed -Nails sharply debrided 10 without complication/bleeding. -Hyperkeratotic lesion sharply debrided x 1 without any complications or bleeding. Continue offloading to the toes.  -Continue gabapentin for neuropathy  -Discussed daily foot inspection. If there are any changes, to call the office immediately.  -Follow-up in 9 weeks or sooner if any problems are to arise. In the meantime, encouraged to call the office with any questions, concerns, changes symptoms.  [name],  DPM

## 2018-11-29 ENCOUNTER — Telehealth: Payer: Self-pay | Admitting: Internal Medicine

## 2018-11-29 NOTE — Telephone Encounter (Unsigned)
Copied from [place] [id number]. Topic: Quick Communication - Rx Refill/Question >> Nov 29, 2018  8:43 AM Alanda Slim E wrote: Medication: HYDROcodone-acetaminophen (NORCO) 7.5-325 MG tablet  Has the patient contacted their pharmacy?  No   Preferred Pharmacy (with phone number or street name): [id number] [place] [place] [phone] (Phone) [phone] (Fax)    Agent: Please be advised that RX refills may take up to 3 business days. We ask that you follow-up with your pharmacy.

## 2018-11-29 NOTE — Telephone Encounter (Signed)
Pt notified pharmacy has refill and to contact them 

## 2018-12-04 DIAGNOSIS — H3562 Retinal hemorrhage, left eye: Secondary | ICD-10-CM | POA: Diagnosis not present

## 2018-12-04 DIAGNOSIS — H353121 Nonexudative age-related macular degeneration, left eye, early dry stage: Secondary | ICD-10-CM | POA: Diagnosis not present

## 2018-12-04 DIAGNOSIS — H353221 Exudative age-related macular degeneration, left eye, with active choroidal neovascularization: Secondary | ICD-10-CM | POA: Diagnosis not present

## 2018-12-04 DIAGNOSIS — H353111 Nonexudative age-related macular degeneration, right eye, early dry stage: Secondary | ICD-10-CM | POA: Diagnosis not present

## 2018-12-11 DIAGNOSIS — H353221 Exudative age-related macular degeneration, left eye, with active choroidal neovascularization: Secondary | ICD-10-CM | POA: Diagnosis not present

## 2018-12-20 ENCOUNTER — Other Ambulatory Visit: Payer: Self-pay

## 2018-12-20 ENCOUNTER — Encounter: Payer: Self-pay | Admitting: Family Medicine

## 2018-12-20 ENCOUNTER — Ambulatory Visit (INDEPENDENT_AMBULATORY_CARE_PROVIDER_SITE_OTHER): Payer: PPO | Admitting: Family Medicine

## 2018-12-20 ENCOUNTER — Ambulatory Visit: Payer: Self-pay

## 2018-12-20 VITALS — BP 150/72 | HR 59 | Ht 72.0 in | Wt 203.0 lb

## 2018-12-20 DIAGNOSIS — M25562 Pain in left knee: Secondary | ICD-10-CM

## 2018-12-20 DIAGNOSIS — S76302A Unspecified injury of muscle, fascia and tendon of the posterior muscle group at thigh level, left thigh, initial encounter: Secondary | ICD-10-CM

## 2018-12-20 NOTE — Progress Notes (Signed)
[facility] [place], [place] [address] Phone: [phone] Subjective:   I Kandace Blitz am serving as a Education administrator for Dr. Hulan Saas.    CC: Left knee pain  SHF:[id number]  Nichlas Pitera Chrobak Brooke Bonito. is a 78 y.o. male coming in with complaint of left knee pain. States he fell 2 weeks ago. His foot got caught on the door and he fell on his knee. Planted flowers about a week ago and felt pain. Has had trouble walking. Not sure if he has had swelling.   Onset- 2 weeks ago Location MCL, medial knee  Duration-  Character-aching sensation with a sharp pain with certain movements Aggravating factors- "turning", walking  Reliving factors-  Therapies tried- using a compression sleeve that is not helpful with walking (medium tightness) Severity-     Past Medical History:  Diagnosis Date  . Alcoholism ([place])    Dry 13 years  . Basal cell carcinoma    Right Chest  . Chronic insomnia    On ambien x 2 years  . Depression   . Diverticulosis of colon   . Dizzy spells   . Elbow fracture, left 2009   [name]  . Glaucoma   . History of TIAs   . Hyperkeratosis   . LBP (low back pain)   . Melanoma [facility]) 2009   x3 [name]  . Osteoarthritis   . Restless leg syndrome   . Tinnitus   . Tobacco abuse    Past Surgical History:  Procedure Laterality Date  . CATARACT EXTRACTION    . MELANOMA EXCISION  2009   x3  . RECONSTRUCTION MEDIAL COLLATERAL LIGAMENT ELBOW W/ TENDON GRAFT  2009   Left, [name]   Social History   Socioeconomic History  . Marital status: Married    Spouse name: Not on file  . Number of children: 2  . Years of education: Not on file  . Highest education level: Not on file  Occupational History  . Occupation: [place] [employer]    Employer: RETIRED  . Occupation: Chiropodist  . Occupation: Heritage manager  Social Needs  . Financial resource strain: Not hard at all  . Food insecurity    Worry:  Never true    Inability: Never true  . Transportation needs    Medical: No    Non-medical: No  Tobacco Use  . Smoking status: Former Smoker    Packs/day: 3.00    Years: 38.00    Pack years: 114.00    Types: Cigarettes    Quit date: 08/30/1990    Years since quitting: 28.3  . Smokeless tobacco: Never Used  . Tobacco comment: states he quit 1986  Substance and Sexual Activity  . Alcohol use: No    Alcohol/week: 0.0 standard drinks    Frequency: Never    Comment: PREVIOUS - DRY 13 yrs  . Drug use: Not Currently  . Sexual activity: Not Currently  Lifestyle  . Physical activity    Days per week: 0 days    Minutes per session: 0 min  . Stress: Not at all  Relationships  . Social connections    Talks on phone: More than three times a week    Gets together: More than three times a week    Attends religious service: More than 4 times per year    Active member of club or organization: Yes    Attends meetings of clubs or  organizations: More than 4 times per year    Relationship status: Married  Other Topics Concern  . Not on file  Social History Narrative  . Not on file   Allergies  Allergen Reactions  . Levofloxacin     REACTION: hands pealed   Family History  Problem Relation Age of Onset  . Cancer Mother        ?breast  . Cancer Father        Lung     Current Outpatient Medications (Cardiovascular):  .  pravastatin (PRAVACHOL) 20 MG tablet, Take 1 tablet (20 mg total) by mouth daily. [name]  terazosin (HYTRIN) 2 MG capsule, TAKE ONE CAPSULE BY MOUTH AT BEDTIME  Current Outpatient Medications (Respiratory):  .  loratadine (CLARITIN) 10 MG tablet, Take 1 tablet (10 mg total) by mouth daily. [name]  triamcinolone (NASACORT) 55 MCG/ACT AERO nasal inhaler, Place 2 sprays into the nose daily.  Current Outpatient Medications (Analgesics):  .  aspirin 81 MG EC tablet, Take 81 mg by mouth daily.   [name]  HYDROcodone-acetaminophen (NORCO) 7.5-325 MG tablet, Take 1 tablet by mouth every  6 (six) hours as needed for severe pain. Please fill on or after 04/31/20 .  HYDROcodone-acetaminophen (NORCO) 7.5-325 MG tablet, Take 1 tablet by mouth every 6 (six) hours as needed for moderate pain.  Current Outpatient Medications (Hematological):  [name]  Cyanocobalamin (VITAMIN B-12) 1000 MCG SUBL, DISSOLVE 2 TABLETS IN MOUTH EVERY DAY  Current Outpatient Medications (Other):  [name]  Cholecalciferol 1000 UNITS tablet, Take 1,000 Units by mouth daily.   .  finasteride (PROSCAR) 5 MG tablet, TAKE 1 TABLET (5 MG TOTAL) BY MOUTH DAILY. [name]  gabapentin (NEURONTIN) 300 MG capsule, Take 1 capsule (300 mg total) by mouth 4 (four) times daily. .  meclizine (ANTIVERT) 12.5 MG tablet, TAKE 1 TABLET (12.5 MG TOTAL) BY MOUTH 3 (THREE) TIMES DAILY AS NEEDED FOR DIZZINESS. .  Multiple Vitamin (MULTIVITAMIN) capsule, Take 1 capsule by mouth daily.   .  Multiple Vitamins-Minerals (PRESERVISION AREDS PO), Take 2 tablets by mouth 2 (two) times daily. [name]  rOPINIRole (REQUIP) 2 MG tablet, 1 PILL AT 12:00, 1 PILL AT 4:00, AND 2 PILLS AT AT BEDTIME .  sulfamethoxazole-trimethoprim (BACTRIM DS) 800-160 MG tablet, Take 1 tablet by mouth 2 (two) times daily. .  tamsulosin (FLOMAX) 0.4 MG CAPS capsule, TAKE ONE CAPSULE EVERY DAY    Past medical history, social, surgical and family history all reviewed in electronic medical record.  No pertanent information unless stated regarding to the chief complaint.   Review of Systems:  No headache, visual changes, nausea, vomiting, diarrhea, constipation, dizziness, abdominal pain, skin rash, fevers, chills, night sweats, weight loss, swollen lymph nodes, body aches, joint swelling,  chest pain, shortness of breath, mood changes.  Positive muscle aches  Objective  Blood pressure (!) 150/72, pulse (!) 59, height 6' (1.829 m), weight 203 lb (92.1 kg), SpO2 98 %.    General: No apparent distress alert and oriented x3 mood and affect normal, dressed appropriately.  HEENT: Pupils equal,  extraocular movements intact  Respiratory: Patient's speak in full sentences and does not appear short of breath  Cardiovascular: No lower extremity edema, non tender, no erythema  Skin: Warm dry intact with no signs of infection or rash on extremities or on axial skeleton.  Abdomen: Soft nontender  Neuro: Cranial nerves II through XII are intact, neurovascularly intact in all extremities with 2+ DTRs and 2+ pulses.  Lymph: No lymphadenopathy of posterior or  anterior cervical chain or axillae bilaterally.  Gait mildly antalgic MSK: Arthritic changes of multiple joints Left knee mild effusion. Full rom, no instability. Mild crepitus.  Right knee mild oa but otherwise   Ltd msk [place] preformed and interpreted by Lyndal Pulley  Medial hamstring tendon split tear   hypoechoic changes and doppler Knee mild narrowing of the medial joint     Impression and Recommendations:     This case required medical decision making of moderate complexity. The above documentation has been reviewed and is accurate and complete Lyndal Pulley, DO       Note: This dictation was prepared with Dragon dictation along with smaller phrase technology. Any transcriptional errors that result from this process are unintentional.

## 2018-12-20 NOTE — Assessment & Plan Note (Signed)
Discuss hep, compression  voltaren gel  rtc in 4 weeks

## 2018-12-20 NOTE — Patient Instructions (Signed)
Good to see you.  Ice 20 minutes 2 times daily. Usually after activity and before bed. Exercises 3 times a week.  Voltaren gel over the counter Vitamin D 4000 IU daily  Thigh compression sleeve See me again in 4-5 weeks

## 2018-12-30 ENCOUNTER — Other Ambulatory Visit: Payer: Self-pay | Admitting: Internal Medicine

## 2018-12-30 NOTE — Telephone Encounter (Signed)
Medication: HYDROcodone-acetaminophen (NORCO) 7.5-325 MG tablet    Patient is requesting refill of this medication.   Pharmacy: [id number] - [place], [address] [phone] (Phone) [phone] (Fax

## 2019-01-01 MED ORDER — HYDROCODONE-ACETAMINOPHEN 7.5-325 MG PO TABS: 1.0000 | ORAL_TABLET | Freq: Four times a day (QID) | ORAL | 0 refills | Status: DC | PRN

## 2019-01-15 DIAGNOSIS — H353111 Nonexudative age-related macular degeneration, right eye, early dry stage: Secondary | ICD-10-CM | POA: Diagnosis not present

## 2019-01-15 DIAGNOSIS — H353121 Nonexudative age-related macular degeneration, left eye, early dry stage: Secondary | ICD-10-CM | POA: Diagnosis not present

## 2019-01-15 DIAGNOSIS — H353221 Exudative age-related macular degeneration, left eye, with active choroidal neovascularization: Secondary | ICD-10-CM | POA: Diagnosis not present

## 2019-01-15 DIAGNOSIS — B0230 Zoster ocular disease, unspecified: Secondary | ICD-10-CM | POA: Diagnosis not present

## 2019-01-17 ENCOUNTER — Ambulatory Visit: Payer: PPO | Admitting: Family Medicine

## 2019-01-29 ENCOUNTER — Other Ambulatory Visit: Payer: Self-pay | Admitting: Internal Medicine

## 2019-01-29 ENCOUNTER — Telehealth: Payer: Self-pay | Admitting: Internal Medicine

## 2019-01-29 NOTE — Telephone Encounter (Signed)
Refill for HYDROcodone-acetaminophen ([place]) 7.5-325 MG tablet     Pharmacy:  [id number] - [place], [address] [place] [phone] (Phone) [phone] (Fax)

## 2019-01-29 NOTE — Telephone Encounter (Signed)
Check [place] registry last filled 01/01/2019.Marland KitchenJohny Chess

## 2019-01-30 DIAGNOSIS — H402222 Chronic angle-closure glaucoma, left eye, moderate stage: Secondary | ICD-10-CM | POA: Diagnosis not present

## 2019-01-30 DIAGNOSIS — H353113 Nonexudative age-related macular degeneration, right eye, advanced atrophic without subfoveal involvement: Secondary | ICD-10-CM | POA: Diagnosis not present

## 2019-01-30 MED ORDER — HYDROCODONE-ACETAMINOPHEN 7.5-325 MG PO TABS: 1.0000 | ORAL_TABLET | Freq: Four times a day (QID) | ORAL | 0 refills | Status: DC | PRN

## 2019-01-30 NOTE — Telephone Encounter (Signed)
Okay.  Thanks.

## 2019-02-05 ENCOUNTER — Ambulatory Visit (INDEPENDENT_AMBULATORY_CARE_PROVIDER_SITE_OTHER): Payer: PPO | Admitting: Internal Medicine

## 2019-02-05 ENCOUNTER — Encounter: Payer: Self-pay | Admitting: Internal Medicine

## 2019-02-05 ENCOUNTER — Other Ambulatory Visit: Payer: Self-pay

## 2019-02-05 DIAGNOSIS — G2581 Restless legs syndrome: Secondary | ICD-10-CM | POA: Diagnosis not present

## 2019-02-05 DIAGNOSIS — I10 Essential (primary) hypertension: Secondary | ICD-10-CM | POA: Diagnosis not present

## 2019-02-05 DIAGNOSIS — M79642 Pain in left hand: Secondary | ICD-10-CM

## 2019-02-05 DIAGNOSIS — E538 Deficiency of other specified B group vitamins: Secondary | ICD-10-CM

## 2019-02-05 DIAGNOSIS — E559 Vitamin D deficiency, unspecified: Secondary | ICD-10-CM | POA: Diagnosis not present

## 2019-02-05 DIAGNOSIS — N401 Enlarged prostate with lower urinary tract symptoms: Secondary | ICD-10-CM

## 2019-02-05 MED ORDER — HYDROCODONE-ACETAMINOPHEN 7.5-325 MG PO TABS: 1.0000 | ORAL_TABLET | Freq: Four times a day (QID) | ORAL | 0 refills | Status: DC | PRN

## 2019-02-05 NOTE — Assessment & Plan Note (Signed)
Vit D 

## 2019-02-05 NOTE — Assessment & Plan Note (Signed)
On B12 

## 2019-02-05 NOTE — Assessment & Plan Note (Signed)
On Norco  Potential benefits of a long term opioids use as well as potential risks (i.e. addiction risk, apnea etc) and complications (i.e. Somnolence, constipation and others) were explained to the patient and were aknowledged.  

## 2019-02-05 NOTE — Assessment & Plan Note (Signed)
BP Readings from Last 3 Encounters:  02/05/19 128/70  12/20/18 (!) 150/72  05/28/18 138/78

## 2019-02-05 NOTE — Progress Notes (Signed)
Subjective:  Patient ID: Justin [name]., male    DOB: 1941/05/25  Age: 78 y.o. MRN: [id number]  CC: No chief complaint on file.   HPI Justin [name] [employer]. presents for chronic pain, B12 def, RLS  Outpatient Medications Prior to Visit  Medication Sig Dispense Refill  . aspirin 81 MG EC tablet Take 81 mg by mouth daily.      . Cholecalciferol 1000 UNITS tablet Take 1,000 Units by mouth daily.      . Cyanocobalamin (VITAMIN B-12) 1000 MCG SUBL DISSOLVE 2 TABLETS IN MOUTH EVERY DAY 60 tablet 8  . finasteride (PROSCAR) 5 MG tablet TAKE 1 TABLET (5 MG TOTAL) BY MOUTH DAILY. 90 tablet 3  . gabapentin (NEURONTIN) 300 MG capsule Take 1 capsule (300 mg total) by mouth 4 (four) times daily. 360 capsule 1  . HYDROcodone-acetaminophen (NORCO) 7.5-325 MG tablet Take 1 tablet by mouth every 6 (six) hours as needed for severe pain. Please fill on or after 04/31/20 120 tablet 0  . HYDROcodone-acetaminophen (NORCO) 7.5-325 MG tablet Take 1 tablet by mouth every 6 (six) hours as needed for moderate pain. 120 tablet 0  . loratadine (CLARITIN) 10 MG tablet Take 1 tablet (10 mg total) by mouth daily. 30 tablet 11  . meclizine (ANTIVERT) 12.5 MG tablet TAKE 1 TABLET (12.5 MG TOTAL) BY MOUTH 3 (THREE) TIMES DAILY AS NEEDED FOR DIZZINESS. 60 tablet 1  . Multiple Vitamin (MULTIVITAMIN) capsule Take 1 capsule by mouth daily.      . Multiple Vitamins-Minerals (PRESERVISION AREDS PO) Take 2 tablets by mouth 2 (two) times daily.    . pravastatin (PRAVACHOL) 20 MG tablet Take 1 tablet (20 mg total) by mouth daily. 30 tablet 11  . rOPINIRole (REQUIP) 2 MG tablet 1 PILL AT 12:00, 1 PILL AT 4:00, AND 2 PILLS AT AT BEDTIME 120 tablet 5  . sulfamethoxazole-trimethoprim (BACTRIM DS) 800-160 MG tablet Take 1 tablet by mouth 2 (two) times daily. 20 tablet 0  . tamsulosin (FLOMAX) 0.4 MG CAPS capsule TAKE ONE CAPSULE EVERY DAY 90 capsule 3  . terazosin (HYTRIN) 2 MG capsule TAKE ONE CAPSULE BY MOUTH AT BEDTIME 90  capsule 3  . triamcinolone (NASACORT) 55 MCG/ACT AERO nasal inhaler Place 2 sprays into the nose daily. 1 Inhaler 12   No facility-administered medications prior to visit.     ROS: Review of Systems  Constitutional: Negative for appetite change, fatigue and unexpected weight change.  HENT: Negative for congestion, nosebleeds, sneezing, sore throat and trouble swallowing.   Eyes: Negative for itching and visual disturbance.  Respiratory: Negative for cough.   Cardiovascular: Negative for chest pain, palpitations and leg swelling.  Gastrointestinal: Negative for abdominal distention, blood in stool, diarrhea and nausea.  Genitourinary: Negative for frequency and hematuria.  Musculoskeletal: Positive for arthralgias. Negative for back pain, gait problem, joint swelling and neck pain.  Skin: Negative for rash.  Neurological: Negative for dizziness, tremors, speech difficulty and weakness.  Psychiatric/Behavioral: Positive for sleep disturbance. Negative for agitation, dysphoric mood and suicidal ideas. The patient is not nervous/anxious.     Objective:  BP 128/70 (BP Location: Right Arm, Patient Position: Sitting, Cuff Size: Large)   Pulse 71   Temp 98.1 F (36.7 C) (Oral)   Ht 6' (1.829 m)   Wt 202 lb (91.6 kg)   SpO2 96%   BMI 27.40 kg/m   BP Readings from Last 3 Encounters:  02/05/19 128/70  12/20/18 (!) 150/72  05/28/18 138/78  Wt Readings from Last 3 Encounters:  02/05/19 202 lb (91.6 kg)  12/20/18 203 lb (92.1 kg)  05/28/18 195 lb (88.5 kg)    Physical Exam Constitutional:      General: He is not in acute distress.    Appearance: He is well-developed.     Comments: NAD  Eyes:     Conjunctiva/sclera: Conjunctivae normal.     Pupils: Pupils are equal, round, and reactive to light.  Neck:     Musculoskeletal: Normal range of motion.     Thyroid: No thyromegaly.     Vascular: No JVD.  Cardiovascular:     Rate and Rhythm: Normal rate and regular rhythm.      Heart sounds: Normal heart sounds. No murmur. No friction rub. No gallop.   Pulmonary:     Effort: Pulmonary effort is normal. No respiratory distress.     Breath sounds: Normal breath sounds. No wheezing or rales.  Chest:     Chest wall: No tenderness.  Abdominal:     General: Bowel sounds are normal. There is no distension.     Palpations: Abdomen is soft. There is no mass.     Tenderness: There is no abdominal tenderness. There is no guarding or rebound.  Musculoskeletal: Normal range of motion.        General: Tenderness present.  Lymphadenopathy:     Cervical: No cervical adenopathy.  Skin:    General: Skin is warm and dry.     Findings: No rash.  Neurological:     Mental Status: He is alert and oriented to person, place, and time.     Cranial Nerves: No cranial nerve deficit.     Motor: No abnormal muscle tone.     Coordination: Coordination normal.     Gait: Gait normal.     Deep Tendon Reflexes: Reflexes are normal and symmetric.  Psychiatric:        Behavior: Behavior normal.        Thought Content: Thought content normal.        Judgment: Judgment normal.   L arm, hand deformity,pain  Lab Results  Component Value Date   WBC 6.2 07/26/2017   HGB 11.3 (L) 07/26/2017   HCT 33.2 (L) 07/26/2017   PLT 239.0 07/26/2017   GLUCOSE 103 (H) 07/31/2017   CHOL 136 01/12/2017   TRIG 67.0 01/12/2017   HDL 54.70 01/12/2017   LDLCALC 68 01/12/2017   ALT 11 07/31/2017   AST 17 07/31/2017   NA 140 07/31/2017   K 4.3 07/31/2017   CL 107 07/31/2017   CREATININE 1.16 07/31/2017   BUN 22 07/31/2017   CO2 26 07/31/2017   TSH 2.83 07/31/2017   PSA 0.47 08/30/2016   HGBA1C 5.6 07/28/2015    Dg Chest 2 View  Result Date: 11/19/2018 CLINICAL DATA:  Anterior chest pain for 1 month EXAM: CHEST - 2 VIEW COMPARISON:  08/02/2017 FINDINGS: The heart size and mediastinal contours are within normal limits. Both lungs are clear. The visualized skeletal structures are unremarkable.  IMPRESSION: No active cardiopulmonary disease. Electronically Signed   By: Inez Catalina M.D.   On: 11/19/2018 14:58    Assessment & Plan:   There are no diagnoses linked to this encounter.   No orders of the defined types were placed in this encounter.    Follow-up: No follow-ups on file.  [name], MD

## 2019-02-05 NOTE — Patient Instructions (Signed)
If you have medicare related insurance (such as traditional Medicare, Blue Cross Medicare, United HealthCare Medicare, or similar), Please make an appointment at the scheduling desk with Jill, the Wellness Health Coach, for your Wellness visit in this office, which is a benefit with your insurance.  

## 2019-02-05 NOTE — Assessment & Plan Note (Signed)
Proscar, Tamsulosin Hytrin

## 2019-02-05 NOTE — Assessment & Plan Note (Signed)
Gabapentin

## 2019-02-18 DIAGNOSIS — H353111 Nonexudative age-related macular degeneration, right eye, early dry stage: Secondary | ICD-10-CM | POA: Diagnosis not present

## 2019-02-18 DIAGNOSIS — H43392 Other vitreous opacities, left eye: Secondary | ICD-10-CM | POA: Diagnosis not present

## 2019-02-18 DIAGNOSIS — H353121 Nonexudative age-related macular degeneration, left eye, early dry stage: Secondary | ICD-10-CM | POA: Diagnosis not present

## 2019-02-18 DIAGNOSIS — H353221 Exudative age-related macular degeneration, left eye, with active choroidal neovascularization: Secondary | ICD-10-CM | POA: Diagnosis not present

## 2019-02-19 DIAGNOSIS — D692 Other nonthrombocytopenic purpura: Secondary | ICD-10-CM | POA: Diagnosis not present

## 2019-02-19 DIAGNOSIS — L570 Actinic keratosis: Secondary | ICD-10-CM | POA: Diagnosis not present

## 2019-02-19 DIAGNOSIS — Z85820 Personal history of malignant melanoma of skin: Secondary | ICD-10-CM | POA: Diagnosis not present

## 2019-02-19 DIAGNOSIS — D2371 Other benign neoplasm of skin of right lower limb, including hip: Secondary | ICD-10-CM | POA: Diagnosis not present

## 2019-02-19 DIAGNOSIS — L814 Other melanin hyperpigmentation: Secondary | ICD-10-CM | POA: Diagnosis not present

## 2019-02-19 DIAGNOSIS — D225 Melanocytic nevi of trunk: Secondary | ICD-10-CM | POA: Diagnosis not present

## 2019-02-19 DIAGNOSIS — Z85828 Personal history of other malignant neoplasm of skin: Secondary | ICD-10-CM | POA: Diagnosis not present

## 2019-02-19 DIAGNOSIS — D1801 Hemangioma of skin and subcutaneous tissue: Secondary | ICD-10-CM | POA: Diagnosis not present

## 2019-02-25 ENCOUNTER — Ambulatory Visit: Payer: PPO | Admitting: Podiatry

## 2019-02-25 ENCOUNTER — Other Ambulatory Visit: Payer: Self-pay

## 2019-02-25 VITALS — Temp 97.9°F

## 2019-02-25 DIAGNOSIS — B351 Tinea unguium: Secondary | ICD-10-CM

## 2019-02-25 DIAGNOSIS — G629 Polyneuropathy, unspecified: Secondary | ICD-10-CM | POA: Diagnosis not present

## 2019-02-25 DIAGNOSIS — M79674 Pain in right toe(s): Secondary | ICD-10-CM

## 2019-02-25 DIAGNOSIS — Q828 Other specified congenital malformations of skin: Secondary | ICD-10-CM | POA: Diagnosis not present

## 2019-02-25 DIAGNOSIS — M79675 Pain in left toe(s): Secondary | ICD-10-CM

## 2019-03-02 NOTE — Progress Notes (Signed)
Subjective: 78 y.o. returns the office today for painful, elongated, thickened toenails which he cannot trim himself. He gets a callus to the tip of the left 2nd toe. Denies any redness, drainage or signs of infection. He wants to hold off on the surgery to the left 3rd toe at this time due to his wife being sick but does want to consider it in the future. Denies any systemic complaints such as fevers, chills, nausea, vomiting.   PCP: Plotnikov, Evie Lacks, MD  Objective: AAO 3, NAD DP/PT pulses palpable, CRT less than 3 seconds Neurological status is unchanged.  Nails hypertrophic, dystrophic, elongated, brittle, discolored 10. There is tenderness overlying the nails 1-5 bilaterally. There is no surrounding erythema or drainage along the nail sites. Hyperkeratotic lesion sharply debrided x 1 without any complications or bleeding.  There is deformity present the left third toe and the toe is rigid contracture at the level of the DIPJ.  No open sores, swelling. No other areas of tenderness bilateral lower extremities. No overlying edema, erythema, increased warmth. Hammertoes present.  No pain with calf compression, swelling, warmth, erythema.  Assessment: Patient presents with symptomatic onychomycosis; hyperkeratotic lesion; left third toe deformity  Plan: -Treatment options including alternatives, risks, complications were discussed -Nails sharply debrided 10 without complication/bleeding. -Hyperkeratotic lesion sharply debrided x 1 without any complications or bleeding. Continue offloading to the toes.  -Continue gabapentin for neuropathy  -Discussed daily foot inspection. If there are any changes, to call the office immediately.  -Follow-up in 9 weeks or sooner if any problems are to arise. In the meantime, encouraged to call the office with any questions, concerns, changes symptoms.  [name], DPM

## 2019-03-19 ENCOUNTER — Ambulatory Visit (INDEPENDENT_AMBULATORY_CARE_PROVIDER_SITE_OTHER): Payer: PPO

## 2019-03-19 ENCOUNTER — Other Ambulatory Visit: Payer: Self-pay

## 2019-03-19 DIAGNOSIS — Z23 Encounter for immunization: Secondary | ICD-10-CM | POA: Diagnosis not present

## 2019-04-07 ENCOUNTER — Ambulatory Visit (INDEPENDENT_AMBULATORY_CARE_PROVIDER_SITE_OTHER): Payer: PPO | Admitting: Internal Medicine

## 2019-04-07 ENCOUNTER — Encounter: Payer: Self-pay | Admitting: Internal Medicine

## 2019-04-07 ENCOUNTER — Other Ambulatory Visit: Payer: Self-pay

## 2019-04-07 DIAGNOSIS — M79642 Pain in left hand: Secondary | ICD-10-CM

## 2019-04-07 DIAGNOSIS — I10 Essential (primary) hypertension: Secondary | ICD-10-CM | POA: Diagnosis not present

## 2019-04-07 DIAGNOSIS — E538 Deficiency of other specified B group vitamins: Secondary | ICD-10-CM

## 2019-04-07 DIAGNOSIS — I2510 Atherosclerotic heart disease of native coronary artery without angina pectoris: Secondary | ICD-10-CM

## 2019-04-07 DIAGNOSIS — E559 Vitamin D deficiency, unspecified: Secondary | ICD-10-CM

## 2019-04-07 MED ORDER — HYDROCODONE-ACETAMINOPHEN 7.5-325 MG PO TABS: 1.0000 | ORAL_TABLET | Freq: Four times a day (QID) | ORAL | 0 refills | Status: DC | PRN

## 2019-04-07 NOTE — Progress Notes (Signed)
Subjective:  Patient ID: Justin [name]., male    DOB: [birth year]  Age: 78 y.o. MRN: [id number]  CC: No chief complaint on file.   HPI Justin Rask Dettmer Jr. presents for OA, chronic pain, neuropathy, dyslipidemia  Outpatient Medications Prior to Visit  Medication Sig Dispense Refill  . aspirin 81 MG EC tablet Take 81 mg by mouth daily.      . Cholecalciferol 1000 UNITS tablet Take 1,000 Units by mouth daily.      . Cyanocobalamin (VITAMIN B-12) 1000 MCG SUBL DISSOLVE 2 TABLETS IN MOUTH EVERY DAY 60 tablet 8  . finasteride (PROSCAR) 5 MG tablet TAKE 1 TABLET (5 MG TOTAL) BY MOUTH DAILY. 90 tablet 3  . gabapentin (NEURONTIN) 300 MG capsule Take 1 capsule (300 mg total) by mouth 4 (four) times daily. 360 capsule 1  . HYDROcodone-acetaminophen (NORCO) 7.5-325 MG tablet Take 1 tablet by mouth every 6 (six) hours as needed for severe pain. Please fill on or after 04/02/19 120 tablet 0  . HYDROcodone-acetaminophen (NORCO) 7.5-325 MG tablet Take 1 tablet by mouth every 6 (six) hours as needed for moderate pain. 120 tablet 0  . loratadine (CLARITIN) 10 MG tablet Take 1 tablet (10 mg total) by mouth daily. 30 tablet 11  . meclizine (ANTIVERT) 12.5 MG tablet TAKE 1 TABLET (12.5 MG TOTAL) BY MOUTH 3 (THREE) TIMES DAILY AS NEEDED FOR DIZZINESS. 60 tablet 1  . Multiple Vitamin (MULTIVITAMIN) capsule Take 1 capsule by mouth daily.      . Multiple Vitamins-Minerals (PRESERVISION AREDS PO) Take 2 tablets by mouth 2 (two) times daily.    . pravastatin (PRAVACHOL) 20 MG tablet Take 1 tablet (20 mg total) by mouth daily. 30 tablet 11  . rOPINIRole (REQUIP) 2 MG tablet 1 PILL AT 12:00, 1 PILL AT 4:00, AND 2 PILLS AT AT BEDTIME 120 tablet 5  . sulfamethoxazole-trimethoprim (BACTRIM DS) 800-160 MG tablet Take 1 tablet by mouth 2 (two) times daily. 20 tablet 0  . tamsulosin (FLOMAX) 0.4 MG CAPS capsule TAKE ONE CAPSULE EVERY DAY 90 capsule 3  . terazosin (HYTRIN) 2 MG capsule TAKE ONE CAPSULE BY MOUTH AT  BEDTIME 90 capsule 3  . triamcinolone (NASACORT) 55 MCG/ACT AERO nasal inhaler Place 2 sprays into the nose daily. 1 Inhaler 12   No facility-administered medications prior to visit.     ROS: Review of Systems  Constitutional: Positive for fatigue. Negative for appetite change and unexpected weight change.  HENT: Negative for congestion, nosebleeds, sneezing, sore throat and trouble swallowing.   Eyes: Negative for itching and visual disturbance.  Respiratory: Negative for cough.   Cardiovascular: Negative for chest pain, palpitations and leg swelling.  Gastrointestinal: Negative for abdominal distention, blood in stool, diarrhea and nausea.  Genitourinary: Negative for frequency and hematuria.  Musculoskeletal: Positive for arthralgias and back pain. Negative for gait problem, joint swelling and neck pain.  Skin: Negative for rash.  Neurological: Negative for dizziness, tremors, speech difficulty and weakness.  Psychiatric/Behavioral: Negative for agitation, dysphoric mood, sleep disturbance and suicidal ideas. The patient is not nervous/anxious.     Objective:  BP 130/82 (BP Location: Left Arm, Patient Position: Sitting, Cuff Size: Normal)   Pulse 66   Temp (!) 97.5 F (36.4 C) (Oral)   Ht 6' (1.829 m)   Wt 204 lb (92.5 kg)   SpO2 96%   BMI 27.67 kg/m   BP Readings from Last 3 Encounters:  04/07/19 130/82  02/05/19 128/70  12/20/18 (!) 150/72  Wt Readings from Last 3 Encounters:  04/07/19 204 lb (92.5 kg)  02/05/19 202 lb (91.6 kg)  12/20/18 203 lb (92.1 kg)    Physical Exam Constitutional:      General: He is not in acute distress.    Appearance: He is well-developed.     Comments: NAD  Eyes:     Conjunctiva/sclera: Conjunctivae normal.     Pupils: Pupils are equal, round, and reactive to light.  Neck:     Musculoskeletal: Normal range of motion.     Thyroid: No thyromegaly.     Vascular: No JVD.  Cardiovascular:     Rate and Rhythm: Normal rate and  regular rhythm.     Heart sounds: Normal heart sounds. No murmur. No friction rub. No gallop.   Pulmonary:     Effort: Pulmonary effort is normal. No respiratory distress.     Breath sounds: Normal breath sounds. No wheezing or rales.  Chest:     Chest wall: No tenderness.  Abdominal:     General: Bowel sounds are normal. There is no distension.     Palpations: Abdomen is soft. There is no mass.     Tenderness: There is no abdominal tenderness. There is no guarding or rebound.  Musculoskeletal: Normal range of motion.        General: Tenderness present.  Lymphadenopathy:     Cervical: No cervical adenopathy.  Skin:    General: Skin is warm and dry.     Findings: No rash.  Neurological:     Mental Status: He is alert and oriented to person, place, and time.     Cranial Nerves: No cranial nerve deficit.     Motor: No abnormal muscle tone.     Coordination: Coordination normal.     Gait: Gait normal.     Deep Tendon Reflexes: Reflexes are normal and symmetric.  Psychiatric:        Behavior: Behavior normal.        Thought Content: Thought content normal.        Judgment: Judgment normal.   painful joints, L hand, back  Lab Results  Component Value Date   WBC 6.2 07/26/2017   HGB 11.3 (L) 07/26/2017   HCT 33.2 (L) 07/26/2017   PLT 239.0 07/26/2017   GLUCOSE 103 (H) 07/31/2017   CHOL 136 01/12/2017   TRIG 67.0 01/12/2017   HDL 54.70 01/12/2017   LDLCALC 68 01/12/2017   ALT 11 07/31/2017   AST 17 07/31/2017   NA 140 07/31/2017   K 4.3 07/31/2017   CL 107 07/31/2017   CREATININE 1.16 07/31/2017   BUN 22 07/31/2017   CO2 26 07/31/2017   TSH 2.83 07/31/2017   PSA 0.47 08/30/2016   HGBA1C 5.6 07/28/2015    Dg Chest 2 View  Result Date: 11/19/2018 CLINICAL DATA:  Anterior chest pain for 1 month EXAM: CHEST - 2 VIEW COMPARISON:  08/02/2017 FINDINGS: The heart size and mediastinal contours are within normal limits. Both lungs are clear. The visualized skeletal structures  are unremarkable. IMPRESSION: No active cardiopulmonary disease. Electronically Signed   By: Inez Catalina M.D.   On: 11/19/2018 14:58    Assessment & Plan:   There are no diagnoses linked to this encounter.   No orders of the defined types were placed in this encounter.    Follow-up: No follow-ups on file.  [name], MD

## 2019-04-07 NOTE — Patient Instructions (Signed)
If you have medicare related insurance (such as traditional Medicare, Blue Cross Medicare, United HealthCare Medicare, or similar), Please make an appointment at the scheduling desk with Jill, the Wellness Health Coach, for your Wellness visit in this office, which is a benefit with your insurance.  

## 2019-04-07 NOTE — Assessment & Plan Note (Signed)
Vit D 

## 2019-04-07 NOTE — Assessment & Plan Note (Signed)
On Norco  Potential benefits of a long term opioids use as well as potential risks (i.e. addiction risk, apnea etc) and complications (i.e. Somnolence, constipation and others) were explained to the patient and were aknowledged.  

## 2019-04-07 NOTE — Assessment & Plan Note (Signed)
Pravastatin, ASA 

## 2019-04-07 NOTE — Assessment & Plan Note (Signed)
On B12 

## 2019-04-07 NOTE — Assessment & Plan Note (Signed)
Hytrin 

## 2019-04-09 DIAGNOSIS — H43392 Other vitreous opacities, left eye: Secondary | ICD-10-CM | POA: Diagnosis not present

## 2019-04-09 DIAGNOSIS — H353111 Nonexudative age-related macular degeneration, right eye, early dry stage: Secondary | ICD-10-CM | POA: Diagnosis not present

## 2019-04-09 DIAGNOSIS — H353221 Exudative age-related macular degeneration, left eye, with active choroidal neovascularization: Secondary | ICD-10-CM | POA: Diagnosis not present

## 2019-04-09 DIAGNOSIS — H353121 Nonexudative age-related macular degeneration, left eye, early dry stage: Secondary | ICD-10-CM | POA: Diagnosis not present

## 2019-04-25 ENCOUNTER — Other Ambulatory Visit: Payer: Self-pay | Admitting: Internal Medicine

## 2019-04-30 ENCOUNTER — Telehealth: Payer: Self-pay | Admitting: Internal Medicine

## 2019-04-30 NOTE — Telephone Encounter (Signed)
See spouse chart

## 2019-04-30 NOTE — Telephone Encounter (Signed)
Patient called stating that he received a call from [facility] yesterday regarding a medication? Can you call the patient back and advise please?

## 2019-05-01 ENCOUNTER — Other Ambulatory Visit: Payer: Self-pay | Admitting: Internal Medicine

## 2019-05-21 ENCOUNTER — Ambulatory Visit: Payer: PPO

## 2019-05-21 DIAGNOSIS — H353111 Nonexudative age-related macular degeneration, right eye, early dry stage: Secondary | ICD-10-CM | POA: Diagnosis not present

## 2019-05-21 DIAGNOSIS — H353221 Exudative age-related macular degeneration, left eye, with active choroidal neovascularization: Secondary | ICD-10-CM | POA: Diagnosis not present

## 2019-05-21 DIAGNOSIS — H353121 Nonexudative age-related macular degeneration, left eye, early dry stage: Secondary | ICD-10-CM | POA: Diagnosis not present

## 2019-05-21 DIAGNOSIS — H3562 Retinal hemorrhage, left eye: Secondary | ICD-10-CM | POA: Diagnosis not present

## 2019-05-27 ENCOUNTER — Other Ambulatory Visit: Payer: Self-pay

## 2019-05-27 ENCOUNTER — Ambulatory Visit (INDEPENDENT_AMBULATORY_CARE_PROVIDER_SITE_OTHER): Payer: PPO | Admitting: Podiatry

## 2019-05-27 DIAGNOSIS — Q828 Other specified congenital malformations of skin: Secondary | ICD-10-CM | POA: Diagnosis not present

## 2019-05-27 DIAGNOSIS — B351 Tinea unguium: Secondary | ICD-10-CM | POA: Diagnosis not present

## 2019-05-27 DIAGNOSIS — M79674 Pain in right toe(s): Secondary | ICD-10-CM

## 2019-05-27 DIAGNOSIS — G629 Polyneuropathy, unspecified: Secondary | ICD-10-CM

## 2019-05-27 DIAGNOSIS — M79675 Pain in left toe(s): Secondary | ICD-10-CM | POA: Diagnosis not present

## 2019-05-27 NOTE — Progress Notes (Signed)
Subjective: 78 y.o. returns the office today for painful, elongated, thickened toenails which he cannot trim himself. He gets a callus to the tip of the left 2nd toe. Denies any redness, drainage or signs of infection. Denies any systemic complaints such as fevers, chills, nausea, vomiting.   PCP: Plotnikov, Evie Lacks, MD  Objective: AAO 3, NAD DP/PT pulses palpable, CRT less than 3 seconds Neurological status is unchanged.  Nails hypertrophic, dystrophic, elongated, brittle, discolored 10. There is tenderness overlying the nails 1-5 bilaterally. There is no surrounding erythema or drainage along the nail sites. Hyperkeratotic lesion sharply debrided x 1 without any complications or bleeding.  There is deformity present the left third toe and the toe is rigid contracture at the level of the DIPJ.  No open sores, swelling. No other areas of tenderness bilateral lower extremities. No overlying edema, erythema, increased warmth. Hammertoes present.  No pain with calf compression, swelling, warmth, erythema.  Assessment: Patient presents with symptomatic onychomycosis; hyperkeratotic lesion; left third toe deformity  Plan: -Treatment options including alternatives, risks, complications were discussed -Nails sharply debrided 10 without complication/bleeding. -Hyperkeratotic lesion sharply debrided x 1 without any complications or bleeding. Continue offloading to the toes.  -Discussed daily foot inspection. If there are any changes, to call the office immediately.  -Follow-up in 9 weeks or sooner if any problems are to arise. In the meantime, encouraged to call the office with any questions, concerns, changes symptoms.  [name], DPM

## 2019-05-28 ENCOUNTER — Other Ambulatory Visit: Payer: Self-pay | Admitting: Internal Medicine

## 2019-05-28 DIAGNOSIS — D1801 Hemangioma of skin and subcutaneous tissue: Secondary | ICD-10-CM | POA: Diagnosis not present

## 2019-05-28 DIAGNOSIS — L821 Other seborrheic keratosis: Secondary | ICD-10-CM | POA: Diagnosis not present

## 2019-05-28 DIAGNOSIS — D2261 Melanocytic nevi of right upper limb, including shoulder: Secondary | ICD-10-CM | POA: Diagnosis not present

## 2019-05-28 DIAGNOSIS — Z85828 Personal history of other malignant neoplasm of skin: Secondary | ICD-10-CM | POA: Diagnosis not present

## 2019-05-28 DIAGNOSIS — D225 Melanocytic nevi of trunk: Secondary | ICD-10-CM | POA: Diagnosis not present

## 2019-05-28 DIAGNOSIS — D692 Other nonthrombocytopenic purpura: Secondary | ICD-10-CM | POA: Diagnosis not present

## 2019-05-28 DIAGNOSIS — D2371 Other benign neoplasm of skin of right lower limb, including hip: Secondary | ICD-10-CM | POA: Diagnosis not present

## 2019-05-28 DIAGNOSIS — L570 Actinic keratosis: Secondary | ICD-10-CM | POA: Diagnosis not present

## 2019-06-06 NOTE — Progress Notes (Deleted)
Subjective:   Justin [name]. is a 78 y.o. male who presents for Medicare Annual/Subsequent preventive examination.  This visit occurred during the SARS-CoV-2 public health emergency.  Safety protocols were in place, including screening questions prior to the visit, additional usage of staff PPE, and extensive cleaning of exam room while observing appropriate contact time as indicated for disinfecting solutions.   Review of Systems:     Sleep patterns: {SX; SLEEP PATTERNS:18802::"feels rested on waking","does not get up to void","gets up *** times nightly to void","sleeps *** hours nightly"}.    Home Safety/Smoke Alarms: Feels safe in home. Smoke alarms in place.  Living environment; residence and Firearm Safety: {Rehab home environment / accessibility:30080::"no firearms","firearms stored safely"}. Seat Belt Safety/Bike Helmet: Wears seat belt.     Objective:    Vitals: There were no vitals taken for this visit.  There is no height or weight on file to calculate BMI.  Advanced Directives 05/14/2018 05/03/2017 07/30/2015  Does Patient Have a Medical Advance Directive? No No Yes  Does patient want to make changes to medical advance directive? No - Patient declined - -  Would patient like information on creating a medical advance directive? - Yes (ED - Information included in AVS) -    Tobacco Social History   Tobacco Use  Smoking Status Former Smoker  . Packs/day: 3.00  . Years: 38.00  . Pack years: 114.00  . Types: Cigarettes  . Quit date: 08/30/1990  . Years since quitting: 28.7  Smokeless Tobacco Never Used  Tobacco Comment   states he quit 1986     Counseling given: Not Answered Comment: states he quit 1986  Past Medical History:  Diagnosis Date  . Alcoholism ([place])    Dry 13 years  . Basal cell carcinoma    Right Chest  . Chronic insomnia    On ambien x 2 years  . Depression   . Diverticulosis of colon   . Dizzy spells   . Elbow fracture, left 2009   [name]  . Glaucoma   . History of TIAs   . Hyperkeratosis   . LBP (low back pain)   . Melanoma [facility]) 2009   x3 [name]  . Osteoarthritis   . Restless leg syndrome   . Tinnitus   . Tobacco abuse    Past Surgical History:  Procedure Laterality Date  . CATARACT EXTRACTION    . MELANOMA EXCISION  2009   x3  . RECONSTRUCTION MEDIAL COLLATERAL LIGAMENT ELBOW W/ TENDON GRAFT  2009   Left, [name]   Family History  Problem Relation Age of Onset  . Cancer Mother        ?breast  . Cancer Father        Lung   Social History   Socioeconomic History  . Marital status: Married    Spouse name: Not on file  . Number of children: 2  . Years of education: Not on file  . Highest education level: Not on file  Occupational History  . Occupation: [place] Veteran [employer]    Employer: RETIRED  . Occupation: Chiropodist  . Occupation: Heritage manager  Social Needs  . Financial resource strain: Not hard at all  . Food insecurity    Worry: Never true    Inability: Never true  . Transportation needs    Medical: No    Non-medical: No  Tobacco Use  . Smoking status: Former Smoker    Packs/day: 3.00  Years: 38.00    Pack years: 114.00    Types: Cigarettes    Quit date: 08/30/1990    Years since quitting: 28.7  . Smokeless tobacco: Never Used  . Tobacco comment: states he quit 1986  Substance and Sexual Activity  . Alcohol use: No    Alcohol/week: 0.0 standard drinks    Frequency: Never    Comment: PREVIOUS - DRY 13 yrs  . Drug use: Not Currently  . Sexual activity: Not Currently  Lifestyle  . Physical activity    Days per week: 0 days    Minutes per session: 0 min  . Stress: Not at all  Relationships  . Social connections    Talks on phone: More than three times a week    Gets together: More than three times a week    Attends religious service: More than 4 times per year    Active member of club or organization: Yes    Attends meetings of clubs or  organizations: More than 4 times per year    Relationship status: Married  Other Topics Concern  . Not on file  Social History Narrative  . Not on file    Outpatient Encounter Medications as of 06/09/2019  Medication Sig  . aspirin 81 MG EC tablet Take 81 mg by mouth daily.    . Cholecalciferol 1000 UNITS tablet Take 1,000 Units by mouth daily.    . Cyanocobalamin (VITAMIN B-12) 1000 MCG SUBL DISSOLVE 2 TABLETS IN MOUTH EVERY DAY  . finasteride (PROSCAR) 5 MG tablet TAKE 1 TABLET (5 MG TOTAL) BY MOUTH DAILY.  [name] gabapentin (NEURONTIN) 300 MG capsule TAKE 1 CAPSULE (300 MG TOTAL) BY MOUTH 4 (FOUR) TIMES DAILY.  [name] HYDROcodone-acetaminophen (NORCO) 7.5-325 MG tablet Take 1 tablet by mouth every 6 (six) hours as needed for severe pain. Please fill on or after 05/02/19  . HYDROcodone-acetaminophen (NORCO) 7.5-325 MG tablet Take 1 tablet by mouth every 6 (six) hours as needed for moderate pain.  [name] loratadine (CLARITIN) 10 MG tablet Take 1 tablet (10 mg total) by mouth daily.  . meclizine (ANTIVERT) 12.5 MG tablet TAKE 1 TABLET (12.5 MG TOTAL) BY MOUTH 3 (THREE) TIMES DAILY AS NEEDED FOR DIZZINESS.  . Multiple Vitamin (MULTIVITAMIN) capsule Take 1 capsule by mouth daily.    . Multiple Vitamins-Minerals (PRESERVISION AREDS PO) Take 2 tablets by mouth 2 (two) times daily.  . pravastatin (PRAVACHOL) 20 MG tablet Take 1 tablet (20 mg total) by mouth daily.  [name] rOPINIRole (REQUIP) 2 MG tablet 1 PILL AT 12:00, 1 PILL AT 4:00, AND 2 PILLS AT AT BEDTIME  . sulfamethoxazole-trimethoprim (BACTRIM DS) 800-160 MG tablet Take 1 tablet by mouth 2 (two) times daily.  . tamsulosin (FLOMAX) 0.4 MG CAPS capsule TAKE 1 CAPSULE BY MOUTH EVERY DAY  . terazosin (HYTRIN) 2 MG capsule TAKE ONE CAPSULE BY MOUTH AT BEDTIME  . triamcinolone (NASACORT) 55 MCG/ACT AERO nasal inhaler Place 2 sprays into the nose daily.   No facility-administered encounter medications on file as of 06/09/2019.     Activities of Daily Living  No flowsheet data found.  Patient Care Team: Plotnikov, Evie Lacks, MD as PCP - Cleda Daub, MD as Consulting Physician (Gastroenterology) Martinique, Amy, MD as Consulting Physician (Dermatology)   Assessment:   This is a routine wellness examination for [place]. Physical assessment deferred to PCP.  Exercise Activities and Dietary recommendations   Diet (meal preparation, eat out, water intake, caffeinated beverages, dairy products, fruits and vegetables): {Desc;  diets:16563}   Goals      Patient Stated   . patient (pt-stated)     Wants to stay healthy;  Be with grand-children      Other   . Continue to be as active and as independent as possible    . Patient Stated     Stay healthy and independent        Fall Risk Fall Risk  05/14/2018 05/03/2017 11/29/2016 07/30/2015 01/14/2015  Falls in the past year? 1 No No No No  Number falls in past yr: 0 - - - -  Injury with Fall? 0 - - - -  Risk for fall due to : Impaired balance/gait - - - -  Follow up Falls prevention discussed - - - -   Is the patient's home free of loose throw rugs in walkways, pet beds, electrical cords, etc?   {Blank single:19197::"yes","no"}      Grab bars in the bathroom? {Blank single:19197::"yes","no"}      Handrails on the stairs?   {Blank single:19197::"yes","no"}      Adequate lighting?   {Blank single:19197::"yes","no"}  Depression Screen PHQ 2/9 Scores 05/14/2018 05/03/2017 11/30/2016 07/30/2015  PHQ - 2 Score 1 2 0 0  PHQ- 9 Score 4 3 7  -    Cognitive Function MMSE - Mini Mental State Exam 05/03/2017  Orientation to time 5  Orientation to Place 5  Registration 3  Attention/ Calculation 5  Recall 2  Language- name 2 objects 2  Language- repeat 1  Language- follow 3 step command 3  Language- read & follow direction 1  Write a sentence 1  Copy design 1  Total score 29        Immunization History  Administered Date(s) Administered  . Fluad Quad(high Dose 65+) 03/19/2019  .  Influenza Split 03/30/2011, 05/09/2012  . Influenza Whole 04/22/2008, 03/31/2009  . Influenza, High Dose Seasonal PF 05/07/2013, 03/23/2016, 03/27/2017, 04/03/2018  . Influenza,inj,Quad PF,6+ Mos 04/07/2014, 04/29/2015  . Pneumococcal Conjugate-13 06/13/2013  . Pneumococcal Polysaccharide-23 10/27/2009  . Td 06/23/2010   Screening Tests Health Maintenance  Topic Date Due  . TETANUS/TDAP  06/23/2020  . INFLUENZA VACCINE  Completed  . PNA vac Low Risk Adult  Completed      Plan:     I have personally reviewed and noted the following in the patient's chart:   . Medical and social history . Use of alcohol, tobacco or illicit drugs  . Current medications and supplements . Functional ability and status . Nutritional status . Physical activity . Advanced directives . List of other physicians . Vitals . Screenings to include cognitive, depression, and falls . Referrals and appointments  In addition, I have reviewed and discussed with patient certain preventive protocols, quality metrics, and best practice recommendations. A written personalized care plan for preventive services as well as general preventive health recommendations were provided to patient.     Michiel Cowboy, RN  06/06/2019

## 2019-06-09 ENCOUNTER — Ambulatory Visit: Payer: PPO

## 2019-06-09 ENCOUNTER — Ambulatory Visit (INDEPENDENT_AMBULATORY_CARE_PROVIDER_SITE_OTHER): Payer: PPO | Admitting: Internal Medicine

## 2019-06-09 ENCOUNTER — Other Ambulatory Visit: Payer: Self-pay

## 2019-06-09 ENCOUNTER — Encounter: Payer: Self-pay | Admitting: Internal Medicine

## 2019-06-09 DIAGNOSIS — M545 Low back pain, unspecified: Secondary | ICD-10-CM

## 2019-06-09 DIAGNOSIS — G8929 Other chronic pain: Secondary | ICD-10-CM | POA: Diagnosis not present

## 2019-06-09 DIAGNOSIS — E559 Vitamin D deficiency, unspecified: Secondary | ICD-10-CM

## 2019-06-09 DIAGNOSIS — I10 Essential (primary) hypertension: Secondary | ICD-10-CM

## 2019-06-09 DIAGNOSIS — E538 Deficiency of other specified B group vitamins: Secondary | ICD-10-CM

## 2019-06-09 MED ORDER — HYDROCODONE-ACETAMINOPHEN 7.5-325 MG PO TABS: 1.0000 | ORAL_TABLET | Freq: Four times a day (QID) | ORAL | 0 refills | Status: DC | PRN

## 2019-06-09 NOTE — Assessment & Plan Note (Signed)
On B12 

## 2019-06-09 NOTE — Progress Notes (Signed)
Subjective:  Patient ID: Justin Pagan Su Grand., male    DOB: 1940-12-23  Age: 78 y.o. MRN: [id number]  CC: No chief complaint on file.   HPI Justin Lundrigan [employer]. presents for chronic pain, BPH, B12 def Wife w/worsening dementia - stress  Outpatient Medications Prior to Visit  Medication Sig Dispense Refill  . aspirin 81 MG EC tablet Take 81 mg by mouth daily.      . Cholecalciferol 1000 UNITS tablet Take 1,000 Units by mouth daily.      . Cyanocobalamin (VITAMIN B-12) 1000 MCG SUBL DISSOLVE 2 TABLETS IN MOUTH EVERY DAY 60 tablet 8  . finasteride (PROSCAR) 5 MG tablet TAKE 1 TABLET (5 MG TOTAL) BY MOUTH DAILY. 90 tablet 3  . gabapentin (NEURONTIN) 300 MG capsule TAKE 1 CAPSULE (300 MG TOTAL) BY MOUTH 4 (FOUR) TIMES DAILY. 360 capsule 1  . HYDROcodone-acetaminophen (NORCO) 7.5-325 MG tablet Take 1 tablet by mouth every 6 (six) hours as needed for severe pain. Please fill on or after 05/02/19 120 tablet 0  . HYDROcodone-acetaminophen (NORCO) 7.5-325 MG tablet Take 1 tablet by mouth every 6 (six) hours as needed for moderate pain. 120 tablet 0  . loratadine (CLARITIN) 10 MG tablet Take 1 tablet (10 mg total) by mouth daily. 30 tablet 11  . meclizine (ANTIVERT) 12.5 MG tablet TAKE 1 TABLET (12.5 MG TOTAL) BY MOUTH 3 (THREE) TIMES DAILY AS NEEDED FOR DIZZINESS. 60 tablet 1  . Multiple Vitamin (MULTIVITAMIN) capsule Take 1 capsule by mouth daily.      . Multiple Vitamins-Minerals (PRESERVISION AREDS PO) Take 2 tablets by mouth 2 (two) times daily.    . pravastatin (PRAVACHOL) 20 MG tablet Take 1 tablet (20 mg total) by mouth daily. 30 tablet 11  . rOPINIRole (REQUIP) 2 MG tablet 1 PILL AT 12:00, 1 PILL AT 4:00, AND 2 PILLS AT AT BEDTIME 120 tablet 5  . sulfamethoxazole-trimethoprim (BACTRIM DS) 800-160 MG tablet Take 1 tablet by mouth 2 (two) times daily. 20 tablet 0  . tamsulosin (FLOMAX) 0.4 MG CAPS capsule TAKE 1 CAPSULE BY MOUTH EVERY DAY 90 capsule 3  . terazosin (HYTRIN) 2 MG capsule  TAKE ONE CAPSULE BY MOUTH AT BEDTIME 90 capsule 3  . triamcinolone (NASACORT) 55 MCG/ACT AERO nasal inhaler Place 2 sprays into the nose daily. 1 Inhaler 12   No facility-administered medications prior to visit.     ROS: Review of Systems  Constitutional: Negative for appetite change, fatigue and unexpected weight change.  HENT: Negative for congestion, nosebleeds, sneezing, sore throat and trouble swallowing.   Eyes: Negative for itching and visual disturbance.  Respiratory: Negative for cough.   Cardiovascular: Negative for chest pain, palpitations and leg swelling.  Gastrointestinal: Negative for abdominal distention, blood in stool, diarrhea and nausea.  Genitourinary: Negative for frequency and hematuria.  Musculoskeletal: Positive for arthralgias and back pain. Negative for gait problem, joint swelling and neck pain.  Skin: Negative for rash.  Neurological: Negative for dizziness, tremors, speech difficulty and weakness.  Psychiatric/Behavioral: Negative for agitation, dysphoric mood and sleep disturbance. The patient is not nervous/anxious.     Objective:  BP (!) 142/74 (BP Location: Right Arm, Patient Position: Sitting, Cuff Size: Normal)   Pulse 63   Temp 97.6 F (36.4 C) (Oral)   Ht 6' (1.829 m)   Wt 205 lb (93 kg)   SpO2 95%   BMI 27.80 kg/m   BP Readings from Last 3 Encounters:  06/09/19 (!) 142/74  04/07/19 130/82  02/05/19 128/70    Wt Readings from Last 3 Encounters:  06/09/19 205 lb (93 kg)  04/07/19 204 lb (92.5 kg)  02/05/19 202 lb (91.6 kg)    Physical Exam Constitutional:      General: He is not in acute distress.    Appearance: He is well-developed.     Comments: NAD  Eyes:     Conjunctiva/sclera: Conjunctivae normal.     Pupils: Pupils are equal, round, and reactive to light.  Neck:     Musculoskeletal: Normal range of motion.     Thyroid: No thyromegaly.     Vascular: No JVD.  Cardiovascular:     Rate and Rhythm: Normal rate and regular  rhythm.     Heart sounds: Normal heart sounds. No murmur. No friction rub. No gallop.   Pulmonary:     Effort: Pulmonary effort is normal. No respiratory distress.     Breath sounds: Normal breath sounds. No wheezing or rales.  Chest:     Chest wall: No tenderness.  Abdominal:     General: Bowel sounds are normal. There is no distension.     Palpations: Abdomen is soft. There is no mass.     Tenderness: There is no abdominal tenderness. There is no guarding or rebound.  Musculoskeletal: Normal range of motion.        General: Tenderness present.  Lymphadenopathy:     Cervical: No cervical adenopathy.  Skin:    General: Skin is warm and dry.     Findings: No rash.  Neurological:     Mental Status: He is alert and oriented to person, place, and time.     Cranial Nerves: No cranial nerve deficit.     Motor: No abnormal muscle tone.     Coordination: Coordination normal.     Gait: Gait normal.     Deep Tendon Reflexes: Reflexes are normal and symmetric.  Psychiatric:        Behavior: Behavior normal.        Thought Content: Thought content normal.        Judgment: Judgment normal.   LS , L hand is deformed, tender  Lab Results  Component Value Date   WBC 6.2 07/26/2017   HGB 11.3 (L) 07/26/2017   HCT 33.2 (L) 07/26/2017   PLT 239.0 07/26/2017   GLUCOSE 103 (H) 07/31/2017   CHOL 136 01/12/2017   TRIG 67.0 01/12/2017   HDL 54.70 01/12/2017   LDLCALC 68 01/12/2017   ALT 11 07/31/2017   AST 17 07/31/2017   NA 140 07/31/2017   K 4.3 07/31/2017   CL 107 07/31/2017   CREATININE 1.16 07/31/2017   BUN 22 07/31/2017   CO2 26 07/31/2017   TSH 2.83 07/31/2017   PSA 0.47 08/30/2016   HGBA1C 5.6 07/28/2015    Dg Chest 2 View  Result Date: 11/19/2018 CLINICAL DATA:  Anterior chest pain for 1 month EXAM: CHEST - 2 VIEW COMPARISON:  08/02/2017 FINDINGS: The heart size and mediastinal contours are within normal limits. Both lungs are clear. The visualized skeletal structures are  unremarkable. IMPRESSION: No active cardiopulmonary disease. Electronically Signed   By: Inez Catalina M.D.   On: 11/19/2018 14:58    Assessment & Plan:   There are no diagnoses linked to this encounter.   No orders of the defined types were placed in this encounter.    Follow-up: No follow-ups on file.  [name], MD

## 2019-06-09 NOTE — Assessment & Plan Note (Signed)
On Norco  Potential benefits of a long term opioids use as well as potential risks (i.e. addiction risk, apnea etc) and complications (i.e. Somnolence, constipation and others) were explained to the patient and were aknowledged.  

## 2019-06-09 NOTE — Assessment & Plan Note (Signed)
On Vit D 

## 2019-06-09 NOTE — Assessment & Plan Note (Signed)
Hytrin 

## 2019-06-11 ENCOUNTER — Telehealth: Payer: Self-pay | Admitting: Internal Medicine

## 2019-06-11 NOTE — Telephone Encounter (Signed)
Patient would like a call back in regard to rescheduling AWV and to see if this could be done by phone.

## 2019-06-12 ENCOUNTER — Ambulatory Visit (INDEPENDENT_AMBULATORY_CARE_PROVIDER_SITE_OTHER): Payer: PPO | Admitting: *Deleted

## 2019-06-12 DIAGNOSIS — Z0000 Encounter for general adult medical examination without abnormal findings: Secondary | ICD-10-CM | POA: Diagnosis not present

## 2019-06-12 NOTE — Progress Notes (Addendum)
Subjective:   Justin Debusk. is a 78 y.o. male who presents for Medicare Annual/Subsequent preventive examination.  I connected with patient by a telephone and verified that I am speaking with the correct person using two identifiers. Patient stated full name and DOB. Patient gave permission to continue with telephonic visit. Patient's location was at home and Nurse's location was at [place] office. Participants during this visit included patient and nurse.  Review of Systems:   Cardiac Risk Factors include: advanced age (>70men, >26 women) Sleep patterns: has interrupted sleep and sleep hours vary nightly. Patient reports insomnia issues, discussed recommended sleep tips.   Home Safety/Smoke Alarms: Feels safe in home. Smoke alarms in place.  Living environment; residence and Firearm Safety: 1-story house/ trailer. Lives with wife, no needs for DME, good support system Seat Belt Safety/Bike Helmet: Wears seat belt.     Objective:    Vitals: There were no vitals taken for this visit.  There is no height or weight on file to calculate BMI.  Advanced Directives 06/12/2019 05/14/2018 05/03/2017 07/30/2015  Does Patient Have a Medical Advance Directive? No No No Yes  Does patient want to make changes to medical advance directive? No - Patient declined No - Patient declined - -  Would patient like information on creating a medical advance directive? - - Yes (ED - Information included in AVS) -    Tobacco Social History   Tobacco Use  Smoking Status Former Smoker  . Packs/day: 3.00  . Years: 38.00  . Pack years: 114.00  . Types: Cigarettes  . Quit date: 08/30/1990  . Years since quitting: 28.8  Smokeless Tobacco Never Used  Tobacco Comment   states he quit 1986     Counseling given: Not Answered Comment: states he quit 1986  Past Medical History:  Diagnosis Date  . Alcoholism ([place])    Dry 13 years  . Basal cell carcinoma    Right Chest  . Chronic insomnia    On  ambien x 2 years  . Depression   . Diverticulosis of colon   . Dizzy spells   . Elbow fracture, left 2009   [name]  . Glaucoma   . History of TIAs   . Hyperkeratosis   . LBP (low back pain)   . Melanoma [facility]) 2009   x3 [name]  . Osteoarthritis   . Restless leg syndrome   . Tinnitus   . Tobacco abuse    Past Surgical History:  Procedure Laterality Date  . CATARACT EXTRACTION    . mda     Dr. [name] wet injection  . MELANOMA EXCISION  2009   x3  . RECONSTRUCTION MEDIAL COLLATERAL LIGAMENT ELBOW W/ TENDON GRAFT  2009   Left, [name]   Family History  Problem Relation Age of Onset  . Cancer Mother        ?breast  . Cancer Father        Lung   Social History   Socioeconomic History  . Marital status: Married    Spouse name: Not on file  . Number of children: 2  . Years of education: Not on file  . Highest education level: Not on file  Occupational History  . Occupation: [place] Veteran - [employer]    Employer: RETIRED  . Occupation: Chiropodist  . Occupation: Heritage manager  Tobacco Use  . Smoking status: Former Smoker    Packs/day: 3.00    Years: 38.00  Pack years: 114.00    Types: Cigarettes    Quit date: 08/30/1990    Years since quitting: 28.8  . Smokeless tobacco: Never Used  . Tobacco comment: states he quit 1986  Substance and Sexual Activity  . Alcohol use: No    Alcohol/week: 0.0 standard drinks    Comment: PREVIOUS - DRY 13 yrs  . Drug use: Not Currently  . Sexual activity: Not Currently  Other Topics Concern  . Not on file  Social History Narrative  . Not on file   Social Determinants of Health   Financial Resource Strain: Low Risk   . Difficulty of Paying Living Expenses: Not hard at all  Food Insecurity: No Food Insecurity  . Worried About Charity fundraiser in the Last Year: Never true  . Ran Out of Food in the Last Year: Never true  Transportation Needs: No Transportation Needs  . Lack of Transportation  (Medical): No  . Lack of Transportation (Non-Medical): No  Physical Activity: Inactive  . Days of Exercise per Week: 0 days  . Minutes of Exercise per Session: 0 min  Stress: No Stress Concern Present  . Feeling of Stress : Not at all  Social Connections: Not Isolated  . Frequency of Communication with Friends and Family: More than three times a week  . Frequency of Social Gatherings with Friends and Family: More than three times a week  . Attends Religious Services: More than 4 times per year  . Active Member of Clubs or Organizations: Yes  . Attends Archivist Meetings: More than 4 times per year  . Marital Status: Married    Outpatient Encounter Medications as of 06/12/2019  Medication Sig  . aspirin 81 MG EC tablet Take 81 mg by mouth daily.    . Cholecalciferol 1000 UNITS tablet Take 1,000 Units by mouth daily.    . Cyanocobalamin (VITAMIN B-12) 1000 MCG SUBL DISSOLVE 2 TABLETS IN MOUTH EVERY DAY  . finasteride (PROSCAR) 5 MG tablet TAKE 1 TABLET (5 MG TOTAL) BY MOUTH DAILY.  [name] gabapentin (NEURONTIN) 300 MG capsule TAKE 1 CAPSULE (300 MG TOTAL) BY MOUTH 4 (FOUR) TIMES DAILY.  [name] HYDROcodone-acetaminophen (NORCO) 7.5-325 MG tablet Take 1 tablet by mouth every 6 (six) hours as needed for severe pain. Please fill on or after 07/01/19  . HYDROcodone-acetaminophen (NORCO) 7.5-325 MG tablet Take 1 tablet by mouth every 6 (six) hours as needed for moderate pain.  [name] HYDROcodone-acetaminophen (NORCO) 7.5-325 MG tablet Take 1 tablet by mouth every 6 (six) hours as needed for moderate pain.  [name] loratadine (CLARITIN) 10 MG tablet Take 1 tablet (10 mg total) by mouth daily.  . meclizine (ANTIVERT) 12.5 MG tablet TAKE 1 TABLET (12.5 MG TOTAL) BY MOUTH 3 (THREE) TIMES DAILY AS NEEDED FOR DIZZINESS.  . Multiple Vitamin (MULTIVITAMIN) capsule Take 1 capsule by mouth daily.    . Multiple Vitamins-Minerals (PRESERVISION AREDS PO) Take 2 tablets by mouth 2 (two) times daily.  . pravastatin  (PRAVACHOL) 20 MG tablet Take 1 tablet (20 mg total) by mouth daily.  [name] rOPINIRole (REQUIP) 2 MG tablet 1 PILL AT 12:00, 1 PILL AT 4:00, AND 2 PILLS AT AT BEDTIME  . sulfamethoxazole-trimethoprim (BACTRIM DS) 800-160 MG tablet Take 1 tablet by mouth 2 (two) times daily.  . tamsulosin (FLOMAX) 0.4 MG CAPS capsule TAKE 1 CAPSULE BY MOUTH EVERY DAY  . terazosin (HYTRIN) 2 MG capsule TAKE ONE CAPSULE BY MOUTH AT BEDTIME  . triamcinolone (NASACORT) 55 MCG/ACT AERO  nasal inhaler Place 2 sprays into the nose daily.   No facility-administered encounter medications on file as of 06/12/2019.    Activities of Daily Living In your present state of health, do you have any difficulty performing the following activities: 06/12/2019  Hearing? N  Vision? N  Difficulty concentrating or making decisions? N  Walking or climbing stairs? N  Dressing or bathing? N  Doing errands, shopping? N  Preparing Food and eating ? N  Using the Toilet? N  In the past six months, have you accidently leaked urine? N  Do you have problems with loss of bowel control? N  Managing your Medications? N  Managing your Finances? N  Housekeeping or managing your Housekeeping? N  Some recent data might be hidden    Patient Care Team: Plotnikov, Evie Lacks, MD as PCP - General Carol Ada, MD as Consulting Physician (Gastroenterology) Martinique, Amy, MD as Consulting Physician (Dermatology)   Assessment:   This is a routine wellness examination for [place]. Physical assessment deferred to PCP.  Exercise Activities and Dietary recommendations Current Exercise Habits: Home exercise routine, Type of exercise: walking, Time (Minutes): 40, Frequency (Times/Week): 4, Weekly Exercise (Minutes/Week): 160, Intensity: Mild, Exercise limited by: orthopedic condition(s)  Diet (meal preparation, eat out, water intake, caffeinated beverages, dairy products, fruits and vegetables): in general, a "healthy" diet  , well balanced. eats a variety  of fruits and vegetables daily, limits salt, fat/cholesterol, sugar,carbohydrates,caffeine, drinks 6-8 glasses of water daily.  Goals      Patient Stated   . patient (pt-stated)     Wants to stay healthy;  Be with grand-children      Other   . Continue to be as active and as independent as possible    . Patient Stated     Stay healthy and independent        Fall Risk Fall Risk  06/12/2019 05/14/2018 05/03/2017 11/29/2016 07/30/2015  Falls in the past year? 0 1 No No No  Number falls in past yr: 0 0 - - -  Injury with Fall? 0 0 - - -  Risk for fall due to : - Impaired balance/gait - - -  Follow up Falls prevention discussed Falls prevention discussed - - -   Is the patient's home free of loose throw rugs in walkways, pet beds, electrical cords, etc?   yes      Grab bars in the bathroom? yes      Handrails on the stairs?   yes      Adequate lighting?   yes   Depression Screen PHQ 2/9 Scores 06/12/2019 05/14/2018 05/03/2017 11/30/2016  PHQ - 2 Score 0 1 2 0  PHQ- 9 Score 2 4 3 7     Cognitive Function MMSE - Mini Mental State Exam 05/03/2017  Orientation to time 5  Orientation to Place 5  Registration 3  Attention/ Calculation 5  Recall 2  Language- name 2 objects 2  Language- repeat 1  Language- follow 3 step command 3  Language- read & follow direction 1  Write a sentence 1  Copy design 1  Total score 29       Ad8 score reviewed for issues:  Issues making decisions: no  Less interest in hobbies / activities: no  Repeats questions, stories (family complaining): no  Trouble using ordinary gadgets (microwave, computer, phone):no  Forgets the month or year: no  Mismanaging finances: no  Remembering appts: no  Daily problems with thinking and/or memory: no Ad8 score  is= 0  Immunization History  Administered Date(s) Administered  . Fluad Quad(high Dose 65+) 03/19/2019  . Influenza Split 03/30/2011, 05/09/2012  . Influenza Whole 04/22/2008, 03/31/2009   . Influenza, High Dose Seasonal PF 05/07/2013, 03/23/2016, 03/27/2017, 04/03/2018  . Influenza,inj,Quad PF,6+ Mos 04/07/2014, 04/29/2015  . Pneumococcal Conjugate-13 06/13/2013  . Pneumococcal Polysaccharide-23 10/27/2009  . Td 06/23/2010   Screening Tests Health Maintenance  Topic Date Due  . TETANUS/TDAP  06/23/2020  . INFLUENZA VACCINE  Completed  . PNA vac Low Risk Adult  Completed      Plan:     Reviewed health maintenance screenings with patient today and relevant education, vaccines, and/or referrals were provided.   Continue to eat heart healthy diet (full of fruits, vegetables, whole grains, lean protein, water--limit salt, fat, and sugar intake) and increase physical activity as tolerated.  I have personally reviewed and noted the following in the patient's chart:   . Medical and social history . Use of alcohol, tobacco or illicit drugs  . Current medications and supplements . Functional ability and status . Nutritional status . Physical activity . Advanced directives . List of other physicians . Screenings to include cognitive, depression, and falls . Referrals and appointments  In addition, I have reviewed and discussed with patient certain preventive protocols, quality metrics, and best practice recommendations. A written personalized care plan for preventive services as well as general preventive health recommendations were provided to patient.     Michiel Cowboy, RN  06/12/2019  Medical screening examination/treatment/procedure(s) were performed by non-physician practitioner and as supervising physician I was immediately available for consultation/collaboration. I agree with above. [name], MD

## 2019-06-21 ENCOUNTER — Other Ambulatory Visit: Payer: Self-pay | Admitting: Internal Medicine

## 2019-06-24 DIAGNOSIS — H43392 Other vitreous opacities, left eye: Secondary | ICD-10-CM | POA: Diagnosis not present

## 2019-06-24 DIAGNOSIS — H43812 Vitreous degeneration, left eye: Secondary | ICD-10-CM | POA: Diagnosis not present

## 2019-06-24 DIAGNOSIS — H353221 Exudative age-related macular degeneration, left eye, with active choroidal neovascularization: Secondary | ICD-10-CM | POA: Diagnosis not present

## 2019-06-28 ENCOUNTER — Other Ambulatory Visit: Payer: Self-pay | Admitting: Internal Medicine

## 2019-07-07 ENCOUNTER — Other Ambulatory Visit: Payer: Self-pay | Admitting: Internal Medicine

## 2019-07-07 ENCOUNTER — Ambulatory Visit (INDEPENDENT_AMBULATORY_CARE_PROVIDER_SITE_OTHER): Payer: PPO | Admitting: Podiatry

## 2019-07-07 ENCOUNTER — Other Ambulatory Visit: Payer: Self-pay

## 2019-07-07 ENCOUNTER — Encounter: Payer: Self-pay | Admitting: Podiatry

## 2019-07-07 DIAGNOSIS — L600 Ingrowing nail: Secondary | ICD-10-CM

## 2019-07-07 DIAGNOSIS — M79674 Pain in right toe(s): Secondary | ICD-10-CM

## 2019-07-07 MED ORDER — CEPHALEXIN 500 MG PO CAPS: 500.0000 mg | ORAL_CAPSULE | Freq: Three times a day (TID) | ORAL | 0 refills | Status: DC

## 2019-07-07 NOTE — Patient Instructions (Signed)

## 2019-07-14 NOTE — Progress Notes (Signed)
Subjective: 79 year old male presents the office for concerns of ingrown toenail.  His wife is being seen today and should not the toe is added to the schedule.  He states that the toes become red and swollen outside of the toe.  No drainage or pus.  No recent injury. Denies any systemic complaints such as fevers, chills, nausea, vomiting. No acute changes since last appointment, and no other complaints at this time.   Objective: AAO x3, NAD DP/PT pulses palpable bilaterally, CRT less than 3 seconds Incurvation lateral aspect the right hallux toenail with localized edema erythema and granulation tissue present in the nail border.  There is no ascending cellulitis.  There is no purulence identified. No pain with calf compression, swelling, warmth, erythema  Assessment: 79 year old male right lateral hallux ingrown toenail  Plan: -All treatment options discussed with the patient including all alternatives, risks, complications.  -At this time, recommended partial nail removal, I&D without chemical matricectomy to the right lateraldue to infection. Risks and complications were discussed with the patient for which they understand and  verbally consent to the procedure. Under sterile conditions a total of 3 mL of a mixture of 2% lidocaine plain and 0.5% Marcaine plain was infiltrated in a hallux block fashion. Once anesthetized, the skin was prepped in sterile fashion. A tourniquet was then applied. Next the lateral border of the hallux nail border was sharply excised making sure to remove the entire offending nail border. Once the nail was  Removed, the area was debrided and the underlying skin was intact. The area was irrigated and hemostasis was obtained.  A dry sterile dressing was applied. After application of the dressing the tourniquet was removed and there is found to be an immediate capillary refill time to the digit. The patient tolerated the procedure well any complications. Post procedure  instructions were discussed the patient for which he verbally understood. Follow-up in one week for nail check or sooner if any problems are to arise. Discussed signs/symptoms of worsening infection and directed to call the office immediately should any occur or go directly to the emergency room. In the meantime, encouraged to call the office with any questions, concerns, changes symptoms. -Keflex -Patient encouraged to call the office with any questions, concerns, change in symptoms.

## 2019-07-22 ENCOUNTER — Other Ambulatory Visit: Payer: Self-pay

## 2019-07-22 ENCOUNTER — Ambulatory Visit: Payer: PPO | Admitting: Podiatry

## 2019-07-22 DIAGNOSIS — L600 Ingrowing nail: Secondary | ICD-10-CM

## 2019-07-27 NOTE — Progress Notes (Signed)
Subjective: Justin [name]. is a 79 y.o.  male returns to office today for follow up evaluation after having right Hallux partial nail avulsion performed. Patient has been soaking using epsom salts and applying topical antibiotic covered with bandaid daily. He thinks the area is doing great and he was going to cancel but wanted to have the toe checked. No drainage, pus, swelling, redness, pain. Patient denies fevers, chills, nausea, vomiting. Denies any calf pain, chest pain, SOB.   Objective:  Vitals: Reviewed  General: Well developed, nourished, in no acute distress, alert and oriented x3   Dermatology: Skin is warm, dry and supple bilateral. Right hallux nail border appears to be clean, dry, with mild granular tissue and surrounding scab. There is no surrounding erythema, edema, drainage/purulence. The remaining nails appear unremarkable at this time. There are no other lesions or other signs of infection present.  Neurovascular status: Intact. No lower extremity swelling; No pain with calf compression bilateral.  Musculoskeletal: No tenderness to palpation of the  hallux nail fold. Muscular strength within normal limits bilateral.   Assesement and Plan: S/p partial nail avulsion, doing well.   -Continue soaking in epsom salts twice a day followed by antibiotic ointment and a band-aid. Can leave uncovered at night. Continue this until completely healed.  -If the area has not healed in 2 weeks, call the office for follow-up appointment, or sooner if any problems arise.  -Monitor for any signs/symptoms of infection. Call the office immediately if any occur or go directly to the emergency room. Call with any questions/concerns.  [name], DPM

## 2019-07-30 DIAGNOSIS — H43392 Other vitreous opacities, left eye: Secondary | ICD-10-CM | POA: Diagnosis not present

## 2019-07-30 DIAGNOSIS — H353221 Exudative age-related macular degeneration, left eye, with active choroidal neovascularization: Secondary | ICD-10-CM | POA: Diagnosis not present

## 2019-07-30 DIAGNOSIS — H353111 Nonexudative age-related macular degeneration, right eye, early dry stage: Secondary | ICD-10-CM | POA: Diagnosis not present

## 2019-07-30 DIAGNOSIS — H353121 Nonexudative age-related macular degeneration, left eye, early dry stage: Secondary | ICD-10-CM | POA: Diagnosis not present

## 2019-07-31 ENCOUNTER — Ambulatory Visit: Payer: PPO

## 2019-08-07 ENCOUNTER — Ambulatory Visit: Payer: PPO | Attending: Internal Medicine

## 2019-08-07 DIAGNOSIS — Z961 Presence of intraocular lens: Secondary | ICD-10-CM | POA: Diagnosis not present

## 2019-08-07 DIAGNOSIS — Z23 Encounter for immunization: Secondary | ICD-10-CM | POA: Insufficient documentation

## 2019-08-07 DIAGNOSIS — H402222 Chronic angle-closure glaucoma, left eye, moderate stage: Secondary | ICD-10-CM | POA: Diagnosis not present

## 2019-08-07 DIAGNOSIS — H402211 Chronic angle-closure glaucoma, right eye, mild stage: Secondary | ICD-10-CM | POA: Diagnosis not present

## 2019-08-07 DIAGNOSIS — H353221 Exudative age-related macular degeneration, left eye, with active choroidal neovascularization: Secondary | ICD-10-CM | POA: Diagnosis not present

## 2019-08-07 NOTE — Progress Notes (Signed)
   [id number] Vaccination Clinic  Name:  Justin [name] [facility].    MRN: [id number] DOB: [birth year]  08/07/2019  Justin [name] was observed post Covid-19 immunization for 15 minutes without incidence. He was provided with Vaccine Information Sheet and instruction to access the V-Safe system.   Justin [name] was instructed to call 911 with any severe reactions post vaccine: [name] Difficulty breathing  . Swelling of your face and throat  . A fast heartbeat  . A bad rash all over your body  . Dizziness and weakness

## 2019-08-07 NOTE — Progress Notes (Signed)
   [id number] Vaccination Clinic  Name:  Justin [name].    MRN: [id number] DOB: 08-12-1940  08/07/2019  Justin [name] was observed post Covid-19 immunization for 15 minutes without incidence. He was provided with Vaccine Information Sheet and instruction to access the V-Safe system.   Justin [name] was instructed to call 911 with any severe reactions post vaccine: [name] Difficulty breathing  . Swelling of your face and throat  . A fast heartbeat  . A bad rash all over your body  . Dizziness and weakness

## 2019-08-13 IMAGING — CT CT ANGIO CHEST
3 of 8 series · 19 of 36 positions shown · IV contrast (iopamidol)
Comparison: 03/03/2010

CLINICAL DATA: Slight shortness of breath, fatigue for several
months.

EXAM:
CT ANGIOGRAPHY CHEST WITH CONTRAST
TECHNIQUE: Multidetector CT imaging of the chest was performed using the
standard protocol during bolus administration of intravenous
contrast. Multiplanar CT image reconstructions and MIPs were
obtained to evaluate the vascular anatomy.
CONTRAST:  80mL [id number] IOPAMIDOL ([id number]) INJECTION 76%

[Series 5: thins · axial · 0.69mm/px · z∈[-326,-31]mm · 16 of 335 slices shown]
[im 20/335  lung]
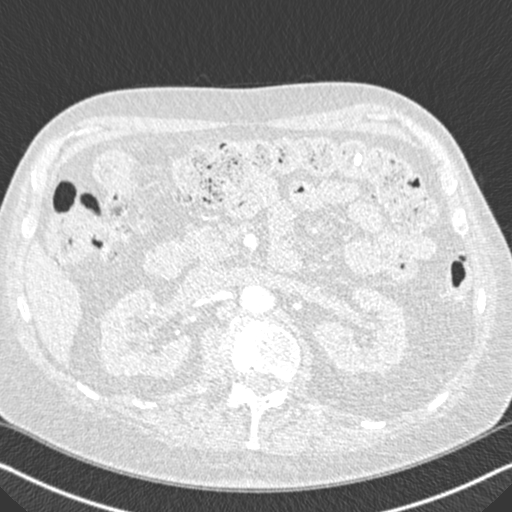
[im 40/335  mediastinal]
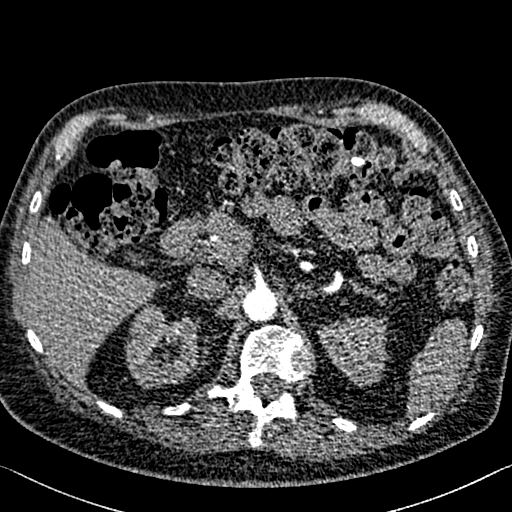
[im 59/335  lung]
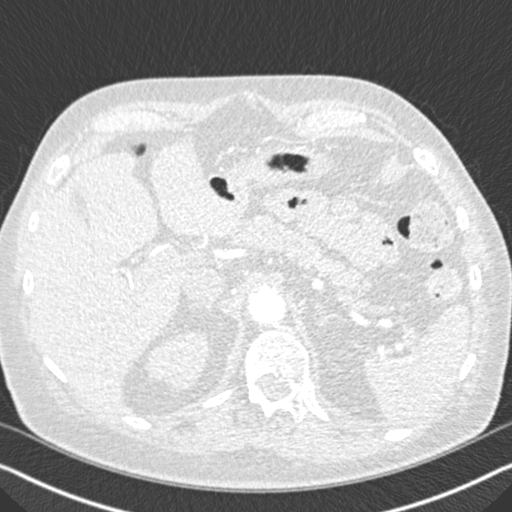
[im 79/335  mediastinal]
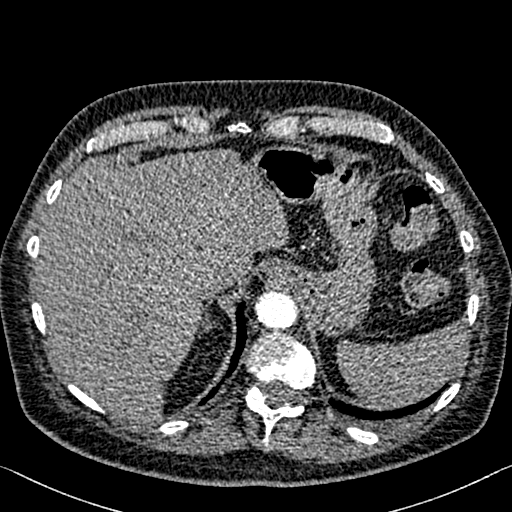
[im 99/335  lung]
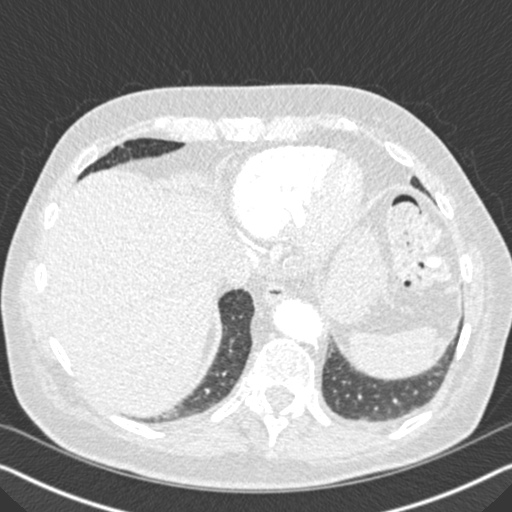
[im 118/335  mediastinal]
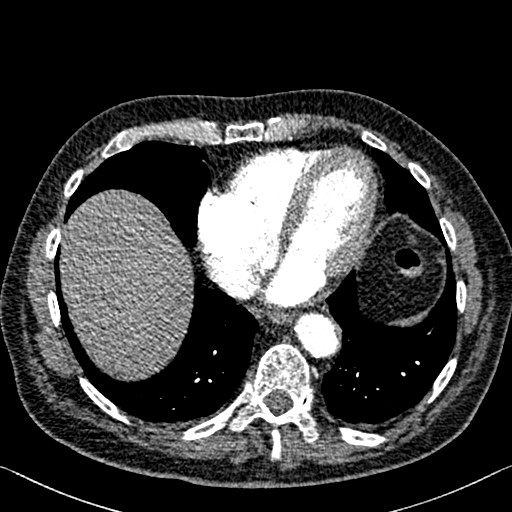
[im 138/335  lung]
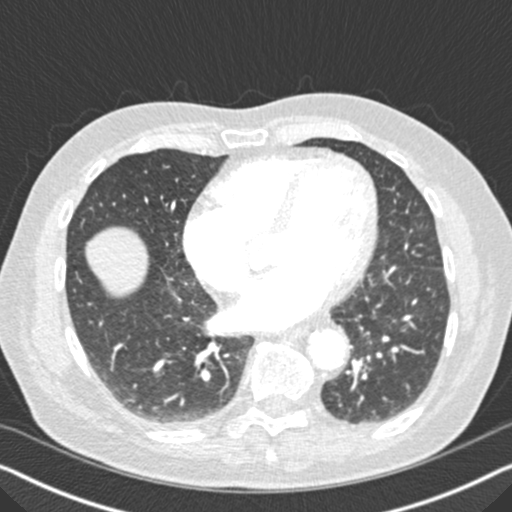
[im 158/335  mediastinal]
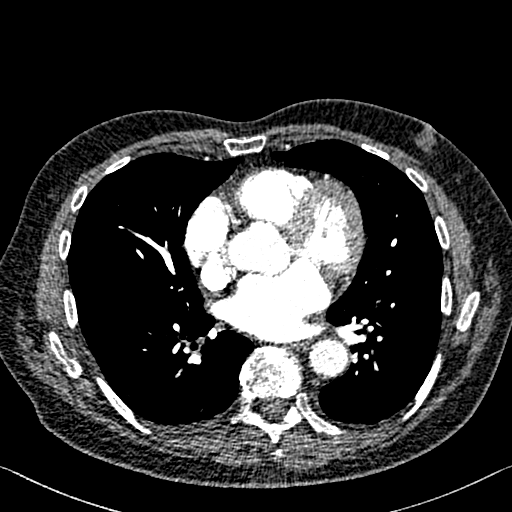
[im 177/335  lung]
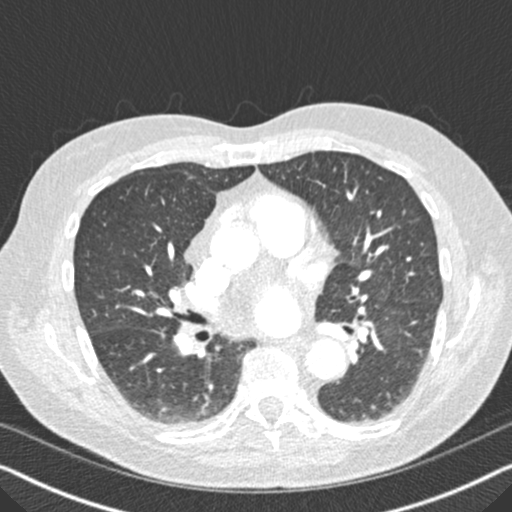
[im 197/335  mediastinal]
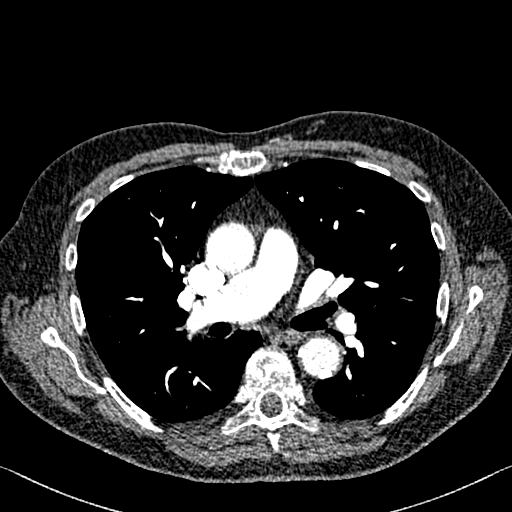
[im 217/335  lung]
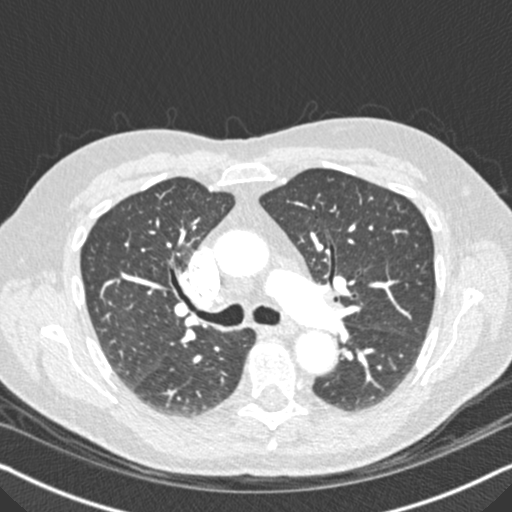
[im 236/335  mediastinal]
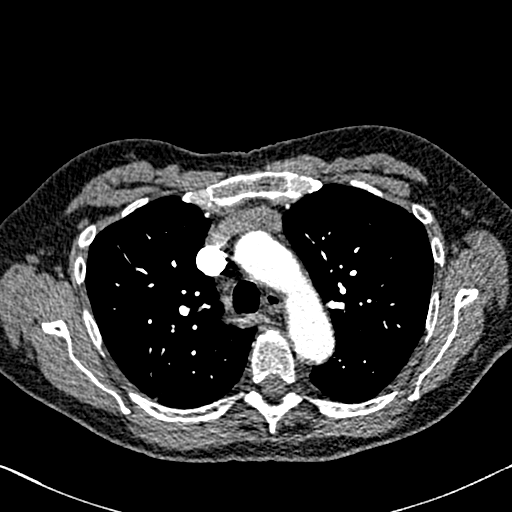
[im 256/335  lung]
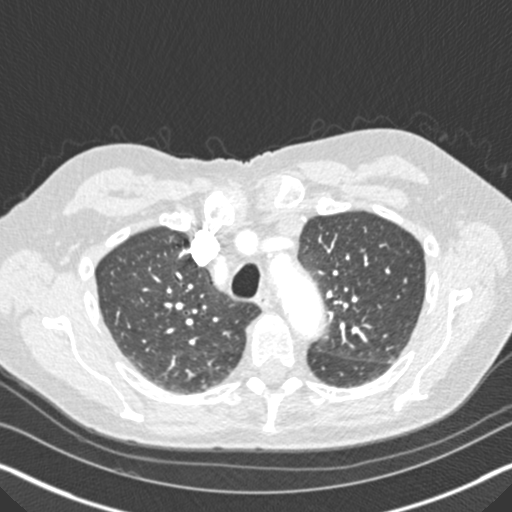
[im 276/335  mediastinal]
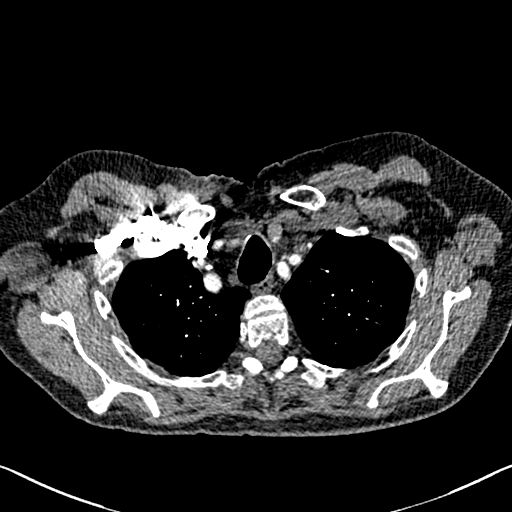
[im 295/335  lung]
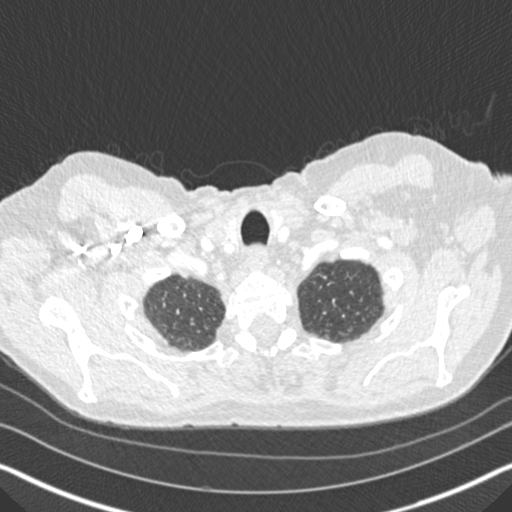
[im 315/335  mediastinal]
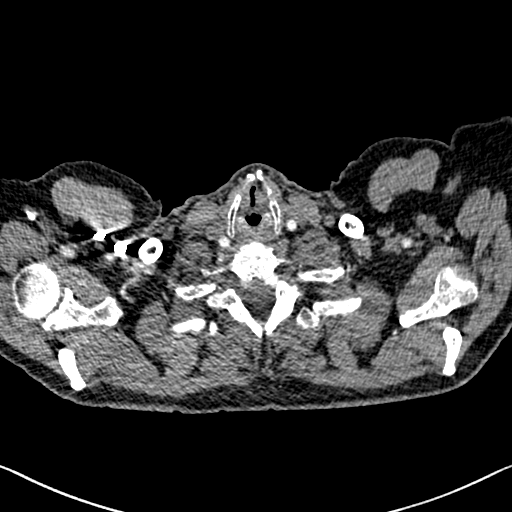

[Series 6: lung · axial · 0.69mm/px · z∈[-188,-107]mm · 2 of 81 slices shown]
[im 27/81  mediastinal]
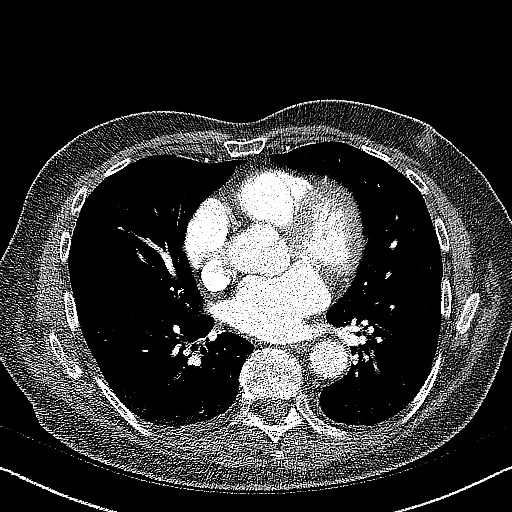
[im 54/81  mediastinal]
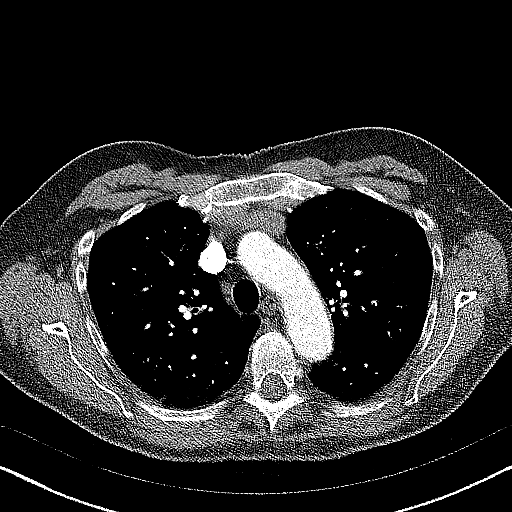

[Series 7: coronal mpr · coronal · 0.66mm/px · 1 of 120 slices shown]
[im 60/120  mediastinal]
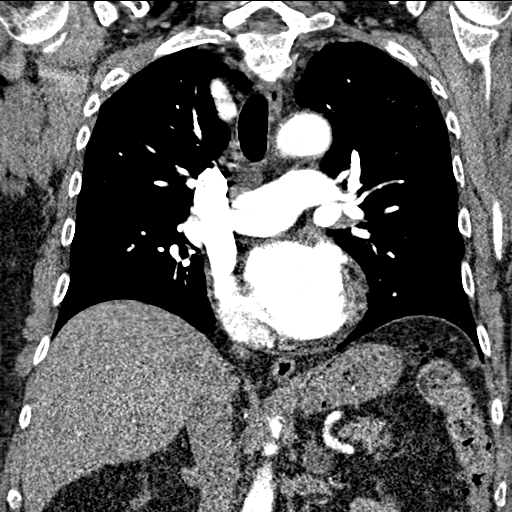

[19 of 36 positions shown; findings below may reference images not displayed]

FINDINGS: Cardiovascular: Satisfactory opacification of the pulmonary arteries
to the segmental level. No evidence of pulmonary embolism. Normal
heart size. No pericardial effusion. Normal caliber thoracic aorta.
No thoracic aortic dissection. Thoracic aortic atherosclerosis.

Mediastinum/Nodes: No enlarged mediastinal, hilar, or axillary lymph
nodes. Thyroid gland, trachea, and esophagus demonstrate no
significant findings.

Lungs/Pleura: Lungs are clear. No pleural effusion or pneumothorax.

Upper Abdomen: No acute abnormality.

Musculoskeletal: Levoscoliosis of the thoracolumbar spine. No acute
osseous abnormality. No aggressive osseous lesion.

Review of the MIP images confirms the above findings.
IMPRESSION: 1. No evidence pulmonary embolus.
2. Normal caliber thoracic aorta without dissection.
3.  Aortic Atherosclerosis ([id number]-BTC.C).

## 2019-08-19 DIAGNOSIS — D2371 Other benign neoplasm of skin of right lower limb, including hip: Secondary | ICD-10-CM | POA: Diagnosis not present

## 2019-08-19 DIAGNOSIS — D225 Melanocytic nevi of trunk: Secondary | ICD-10-CM | POA: Diagnosis not present

## 2019-08-19 DIAGNOSIS — D1801 Hemangioma of skin and subcutaneous tissue: Secondary | ICD-10-CM | POA: Diagnosis not present

## 2019-08-19 DIAGNOSIS — L853 Xerosis cutis: Secondary | ICD-10-CM | POA: Diagnosis not present

## 2019-08-19 DIAGNOSIS — D2261 Melanocytic nevi of right upper limb, including shoulder: Secondary | ICD-10-CM | POA: Diagnosis not present

## 2019-08-19 DIAGNOSIS — Z85828 Personal history of other malignant neoplasm of skin: Secondary | ICD-10-CM | POA: Diagnosis not present

## 2019-08-19 DIAGNOSIS — L821 Other seborrheic keratosis: Secondary | ICD-10-CM | POA: Diagnosis not present

## 2019-08-19 DIAGNOSIS — L570 Actinic keratosis: Secondary | ICD-10-CM | POA: Diagnosis not present

## 2019-08-19 DIAGNOSIS — D692 Other nonthrombocytopenic purpura: Secondary | ICD-10-CM | POA: Diagnosis not present

## 2019-08-28 ENCOUNTER — Ambulatory Visit: Payer: PPO | Admitting: Podiatry

## 2019-08-28 ENCOUNTER — Encounter: Payer: Self-pay | Admitting: Podiatry

## 2019-08-28 ENCOUNTER — Other Ambulatory Visit: Payer: Self-pay

## 2019-08-28 DIAGNOSIS — M79674 Pain in right toe(s): Secondary | ICD-10-CM

## 2019-08-28 DIAGNOSIS — Q828 Other specified congenital malformations of skin: Secondary | ICD-10-CM | POA: Diagnosis not present

## 2019-08-28 DIAGNOSIS — G629 Polyneuropathy, unspecified: Secondary | ICD-10-CM

## 2019-08-28 DIAGNOSIS — M79675 Pain in left toe(s): Secondary | ICD-10-CM

## 2019-08-28 DIAGNOSIS — B351 Tinea unguium: Secondary | ICD-10-CM

## 2019-08-28 NOTE — Progress Notes (Signed)
Subjective: 79 y.o. returns the office today for painful, elongated, thickened toenails which he cannot trim himself. He gets a callus to the tip of the left 2nd toe which is still causing discomfort. Denies any redness, drainage or signs of infection. Denies any systemic complaints such as fevers, chills, nausea, vomiting.   PCP: Plotnikov, Evie Lacks, MD  Objective: AAO 3, NAD DP/PT pulses palpable, CRT less than 3 seconds Neurological status is unchanged.  Nails hypertrophic, dystrophic, elongated, brittle, discolored 10. There is tenderness overlying the nails 1-5 bilaterally. There is no surrounding erythema or drainage along the nail sites. Hyperkeratotic lesion sharply debrided x 1 without any complications or bleeding.  There is deformity present the left third toe and the toe is rigid contracture at the level of the DIPJ.   No open sores, swelling. No other areas of tenderness bilateral lower extremities. No overlying edema, erythema, increased warmth. Hammertoes present.  No pain with calf compression, swelling, warmth, erythema.  Assessment: Patient presents with symptomatic onychomycosis; hyperkeratotic lesion; left third toe deformity  Plan: -Treatment options including alternatives, risks, complications were discussed -Nails sharply debrided 10 without complication/bleeding. -Hyperkeratotic lesion sharply debrided x 1 without any complications or bleeding. Continue offloading to the toes.  -Discussed daily foot inspection. If there are any changes, to call the office immediately.  -Follow-up in 9 weeks or sooner if any problems are to arise. In the meantime, encouraged to call the office with any questions, concerns, changes symptoms.  [name], DPM

## 2019-09-01 ENCOUNTER — Ambulatory Visit: Payer: PPO | Attending: Internal Medicine

## 2019-09-01 ENCOUNTER — Telehealth: Payer: Self-pay

## 2019-09-01 DIAGNOSIS — G2581 Restless legs syndrome: Secondary | ICD-10-CM

## 2019-09-01 DIAGNOSIS — Z23 Encounter for immunization: Secondary | ICD-10-CM | POA: Insufficient documentation

## 2019-09-01 NOTE — Telephone Encounter (Signed)
New message    The patient verbalized C/o of restless legs - both. up since 6 am yesterday.   Appt offers to see another MD or NP patient refuses, stated since Dr. Pollyann Samples knows him why does he need to come into the office.   Medication is called in or a call back from the nurse for suggestions.   [facility] on [address] [place].  Second COVID vaccine appt today

## 2019-09-01 NOTE — Progress Notes (Signed)
   [id number] Vaccination Clinic  Name:  Justin [name] [facility].    MRN: [id number] DOB: Mar 11, 1941  09/01/2019  Justin [name] was observed post Covid-19 immunization for 15 minutes without incidence. He was provided with Vaccine Information Sheet and instruction to access the V-Safe system.   Justin [name] was instructed to call 911 with any severe reactions post vaccine: [name] Difficulty breathing  . Swelling of your face and throat  . A fast heartbeat  . A bad rash all over your body  . Dizziness and weakness    Immunizations Administered    Name Date Dose VIS Date Route   Pfizer COVID-19 Vaccine 09/01/2019  4:52 PM 0.3 mL 06/13/2019 Intramuscular   Manufacturer: [place]   Lot: [id number]   [place]: [id number]

## 2019-09-01 NOTE — Telephone Encounter (Signed)
Pt states he is taking the Requip and Gabapentin. Please advise

## 2019-09-01 NOTE — Telephone Encounter (Signed)
LMTCB

## 2019-09-02 NOTE — Telephone Encounter (Signed)
I'm not sure what else I can offer. I would suggest a Neurology referral. Thx

## 2019-09-03 DIAGNOSIS — H353221 Exudative age-related macular degeneration, left eye, with active choroidal neovascularization: Secondary | ICD-10-CM | POA: Diagnosis not present

## 2019-09-03 DIAGNOSIS — H353111 Nonexudative age-related macular degeneration, right eye, early dry stage: Secondary | ICD-10-CM | POA: Diagnosis not present

## 2019-09-03 DIAGNOSIS — H43392 Other vitreous opacities, left eye: Secondary | ICD-10-CM | POA: Diagnosis not present

## 2019-09-03 NOTE — Telephone Encounter (Signed)
LMTCB

## 2019-09-03 NOTE — Telephone Encounter (Signed)
New message:   Pt is returning a call for [facility] per VM left today.

## 2019-09-04 NOTE — Telephone Encounter (Signed)
Pt agreed with Neurology referral.

## 2019-09-05 NOTE — Telephone Encounter (Signed)
Okay.  Will do.  Thanks °

## 2019-09-08 ENCOUNTER — Other Ambulatory Visit: Payer: Self-pay

## 2019-09-08 ENCOUNTER — Ambulatory Visit (INDEPENDENT_AMBULATORY_CARE_PROVIDER_SITE_OTHER): Payer: PPO

## 2019-09-08 ENCOUNTER — Encounter: Payer: Self-pay | Admitting: Internal Medicine

## 2019-09-08 ENCOUNTER — Ambulatory Visit (INDEPENDENT_AMBULATORY_CARE_PROVIDER_SITE_OTHER): Payer: PPO | Admitting: Internal Medicine

## 2019-09-08 VITALS — BP 144/86 | HR 64 | Temp 97.9°F | Ht 72.0 in | Wt 207.0 lb

## 2019-09-08 DIAGNOSIS — G2581 Restless legs syndrome: Secondary | ICD-10-CM

## 2019-09-08 DIAGNOSIS — I10 Essential (primary) hypertension: Secondary | ICD-10-CM

## 2019-09-08 DIAGNOSIS — R05 Cough: Secondary | ICD-10-CM

## 2019-09-08 DIAGNOSIS — R059 Cough, unspecified: Secondary | ICD-10-CM | POA: Insufficient documentation

## 2019-09-08 DIAGNOSIS — E559 Vitamin D deficiency, unspecified: Secondary | ICD-10-CM | POA: Diagnosis not present

## 2019-09-08 DIAGNOSIS — M79642 Pain in left hand: Secondary | ICD-10-CM | POA: Diagnosis not present

## 2019-09-08 MED ORDER — HYDROCODONE-ACETAMINOPHEN 7.5-325 MG PO TABS: 1.0000 | ORAL_TABLET | Freq: Four times a day (QID) | ORAL | 0 refills | Status: DC | PRN

## 2019-09-08 MED ORDER — AZITHROMYCIN 250 MG PO TABS: ORAL_TABLET | ORAL | 0 refills | Status: DC

## 2019-09-08 NOTE — Assessment & Plan Note (Signed)
Vit D 

## 2019-09-08 NOTE — Assessment & Plan Note (Signed)
CXR Zpac if not better

## 2019-09-08 NOTE — Assessment & Plan Note (Signed)
Worse Hytrin prn

## 2019-09-08 NOTE — Progress Notes (Signed)
Subjective:  Patient ID: Justin Pagan Su Grand., male    DOB: May 09, 1941  Age: 79 y.o. MRN: [id number]  CC: No chief complaint on file.   HPI Justin Critchley [employer]. presents for chronic pain C/o RLS - worse C/o wheezing, dry cough x 1 month  Outpatient Medications Prior to Visit  Medication Sig Dispense Refill  . aspirin 81 MG EC tablet Take 81 mg by mouth daily.      . Cholecalciferol 1000 UNITS tablet Take 1,000 Units by mouth daily.      . Cyanocobalamin (VITAMIN B-12) 1000 MCG SUBL DISSOLVE 2 TABLETS IN MOUTH EVERY DAY 60 tablet 8  . finasteride (PROSCAR) 5 MG tablet TAKE 1 TABLET BY MOUTH EVERY DAY 90 tablet 1  . gabapentin (NEURONTIN) 300 MG capsule TAKE 1 CAPSULE (300 MG TOTAL) BY MOUTH 4 (FOUR) TIMES DAILY. 360 capsule 1  . HYDROcodone-acetaminophen (NORCO) 7.5-325 MG tablet Take 1 tablet by mouth every 6 (six) hours as needed for severe pain. Please fill on or after 07/01/19 120 tablet 0  . HYDROcodone-acetaminophen (NORCO) 7.5-325 MG tablet Take 1 tablet by mouth every 6 (six) hours as needed for moderate pain. 120 tablet 0  . HYDROcodone-acetaminophen (NORCO) 7.5-325 MG tablet Take 1 tablet by mouth every 6 (six) hours as needed for moderate pain. 120 tablet 0  . loratadine (CLARITIN) 10 MG tablet Take 1 tablet (10 mg total) by mouth daily. 30 tablet 11  . meclizine (ANTIVERT) 12.5 MG tablet TAKE 1 TABLET (12.5 MG TOTAL) BY MOUTH 3 (THREE) TIMES DAILY AS NEEDED FOR DIZZINESS. 60 tablet 1  . Multiple Vitamin (MULTIVITAMIN) capsule Take 1 capsule by mouth daily.      . Multiple Vitamins-Minerals (PRESERVISION AREDS PO) Take 2 tablets by mouth 2 (two) times daily.    . pravastatin (PRAVACHOL) 20 MG tablet TAKE 1 TABLET BY MOUTH EVERY DAY 90 tablet 1  . rOPINIRole (REQUIP) 2 MG tablet TAKE 1 TABLET BY MOUTH AT 12PM, 1 TABLET AT 4PM AND 2 TABLETS AT BEDTIME 120 tablet 5  . tamsulosin (FLOMAX) 0.4 MG CAPS capsule TAKE 1 CAPSULE BY MOUTH EVERY DAY 90 capsule 3  . terazosin (HYTRIN)  2 MG capsule TAKE ONE CAPSULE BY MOUTH AT BEDTIME 90 capsule 3  . triamcinolone (NASACORT) 55 MCG/ACT AERO nasal inhaler Place 2 sprays into the nose daily. 1 Inhaler 12  . cephALEXin (KEFLEX) 500 MG capsule Take 1 capsule (500 mg total) by mouth 3 (three) times daily. (Patient not taking: Reported on 09/08/2019) 21 capsule 0  . sulfamethoxazole-trimethoprim (BACTRIM DS) 800-160 MG tablet Take 1 tablet by mouth 2 (two) times daily. (Patient not taking: Reported on 09/08/2019) 20 tablet 0   No facility-administered medications prior to visit.    ROS: Review of Systems  Constitutional: Positive for fatigue. Negative for appetite change and unexpected weight change.  HENT: Negative for congestion, nosebleeds, sneezing, sore throat and trouble swallowing.   Eyes: Negative for itching and visual disturbance.  Respiratory: Positive for cough and wheezing.   Cardiovascular: Negative for chest pain, palpitations and leg swelling.  Gastrointestinal: Negative for abdominal distention, blood in stool, diarrhea and nausea.  Genitourinary: Negative for frequency and hematuria.  Musculoskeletal: Positive for arthralgias, back pain and gait problem. Negative for joint swelling and neck pain.  Skin: Negative for rash.  Neurological: Negative for dizziness, tremors, speech difficulty and weakness.  Psychiatric/Behavioral: Positive for sleep disturbance. Negative for agitation and dysphoric mood. The patient is not nervous/anxious.     Objective:  BP (!) 144/86 (BP Location: Right Arm, Patient Position: Sitting, Cuff Size: Normal)   Pulse 64   Temp 97.9 F (36.6 C) (Oral)   Ht 6' (1.829 m)   Wt 207 lb (93.9 kg)   SpO2 96%   BMI 28.07 kg/m   BP Readings from Last 3 Encounters:  09/08/19 (!) 144/86  06/09/19 (!) 142/74  04/07/19 130/82    Wt Readings from Last 3 Encounters:  09/08/19 207 lb (93.9 kg)  06/09/19 205 lb (93 kg)  04/07/19 204 lb (92.5 kg)    Physical Exam Constitutional:       General: He is not in acute distress.    Appearance: He is well-developed.     Comments: NAD  Eyes:     Conjunctiva/sclera: Conjunctivae normal.     Pupils: Pupils are equal, round, and reactive to light.  Neck:     Thyroid: No thyromegaly.     Vascular: No JVD.  Cardiovascular:     Rate and Rhythm: Normal rate and regular rhythm.     Heart sounds: Normal heart sounds. No murmur. No friction rub. No gallop.   Pulmonary:     Effort: Pulmonary effort is normal. No respiratory distress.     Breath sounds: Normal breath sounds. No wheezing or rales.  Chest:     Chest wall: No tenderness.  Abdominal:     General: Bowel sounds are normal. There is no distension.     Palpations: Abdomen is soft. There is no mass.     Tenderness: There is no abdominal tenderness. There is no guarding or rebound.  Musculoskeletal:        General: Tenderness present. Normal range of motion.     Cervical back: Normal range of motion.  Lymphadenopathy:     Cervical: No cervical adenopathy.  Skin:    General: Skin is warm and dry.     Findings: No rash.  Neurological:     Mental Status: He is alert and oriented to person, place, and time.     Cranial Nerves: No cranial nerve deficit.     Motor: No abnormal muscle tone.     Coordination: Coordination normal.     Gait: Gait abnormal.     Deep Tendon Reflexes: Reflexes are normal and symmetric.  Psychiatric:        Behavior: Behavior normal.        Thought Content: Thought content normal.        Judgment: Judgment normal.   painful joints  Lab Results  Component Value Date   WBC 6.2 07/26/2017   HGB 11.3 (L) 07/26/2017   HCT 33.2 (L) 07/26/2017   PLT 239.0 07/26/2017   GLUCOSE 103 (H) 07/31/2017   CHOL 136 01/12/2017   TRIG 67.0 01/12/2017   HDL 54.70 01/12/2017   LDLCALC 68 01/12/2017   ALT 11 07/31/2017   AST 17 07/31/2017   NA 140 07/31/2017   K 4.3 07/31/2017   CL 107 07/31/2017   CREATININE 1.16 07/31/2017   BUN 22 07/31/2017   CO2  26 07/31/2017   TSH 2.83 07/31/2017   PSA 0.47 08/30/2016   HGBA1C 5.6 07/28/2015    DG Chest 2 View  Result Date: 11/19/2018 CLINICAL DATA:  Anterior chest pain for 1 month EXAM: CHEST - 2 VIEW COMPARISON:  08/02/2017 FINDINGS: The heart size and mediastinal contours are within normal limits. Both lungs are clear. The visualized skeletal structures are unremarkable. IMPRESSION: No active cardiopulmonary disease. Electronically Signed   By: Elta Guadeloupe  Lukens M.D.   On: 11/19/2018 14:58    Assessment & Plan:     [name], MD

## 2019-09-08 NOTE — Assessment & Plan Note (Signed)
Refractory Neurol ref Cont Rx

## 2019-09-08 NOTE — Assessment & Plan Note (Signed)
On Norco  Potential benefits of a long term opioids use as well as potential risks (i.e. addiction risk, apnea etc) and complications (i.e. Somnolence, constipation and others) were explained to

## 2019-09-09 ENCOUNTER — Encounter: Payer: Self-pay | Admitting: Neurology

## 2019-10-15 ENCOUNTER — Ambulatory Visit (INDEPENDENT_AMBULATORY_CARE_PROVIDER_SITE_OTHER): Payer: PPO | Admitting: Ophthalmology

## 2019-10-15 ENCOUNTER — Other Ambulatory Visit: Payer: Self-pay

## 2019-10-15 ENCOUNTER — Encounter (INDEPENDENT_AMBULATORY_CARE_PROVIDER_SITE_OTHER): Payer: Self-pay | Admitting: Ophthalmology

## 2019-10-15 DIAGNOSIS — H353221 Exudative age-related macular degeneration, left eye, with active choroidal neovascularization: Secondary | ICD-10-CM | POA: Diagnosis not present

## 2019-10-15 DIAGNOSIS — H353121 Nonexudative age-related macular degeneration, left eye, early dry stage: Secondary | ICD-10-CM

## 2019-10-15 DIAGNOSIS — H353122 Nonexudative age-related macular degeneration, left eye, intermediate dry stage: Secondary | ICD-10-CM | POA: Insufficient documentation

## 2019-10-15 DIAGNOSIS — Z961 Presence of intraocular lens: Secondary | ICD-10-CM

## 2019-10-15 DIAGNOSIS — H43812 Vitreous degeneration, left eye: Secondary | ICD-10-CM

## 2019-10-15 DIAGNOSIS — H353111 Nonexudative age-related macular degeneration, right eye, early dry stage: Secondary | ICD-10-CM

## 2019-10-15 MED ORDER — AFLIBERCEPT 2MG/0.05ML IZ SOLN FOR KALEIDOSCOPE
2.0000 mg | INTRAVITREAL | Status: AC | PRN
  Administered 2019-10-15: 2 mg via INTRAVITREAL

## 2019-10-15 NOTE — Progress Notes (Signed)
10/15/2019     CHIEF COMPLAINT Patient presents for Retina Follow Up   HISTORY OF PRESENT ILLNESS: Justin [name]. is a 79 y.o. male who presents to the clinic today for:   HPI    Retina Follow Up    Patient presents with  Wet AMD.  In left eye.  Duration of 6 weeks.  Since onset it is stable.          Comments    6 Week follow up - OCT OU, Possible Eylea OS Patient denies change in vision and overall has no complaints.        Last edited by Gerda Diss, Olivia on 10/15/2019  8:30 AM. (History)      Referring physician: [name], MD [place],  [place] [address]  HISTORICAL INFORMATION:   Selected notes from the MEDICAL RECORD NUMBER    Lab Results  Component Value Date   HGBA1C 5.6 07/28/2015     CURRENT MEDICATIONS: No current outpatient medications on file. (Ophthalmic Drugs)   No current facility-administered medications for this visit. (Ophthalmic Drugs)   Current Outpatient Medications (Other)  Medication Sig  . aspirin 81 MG EC tablet Take 81 mg by mouth daily.    [name] azithromycin (ZITHROMAX Z-PAK) 250 MG tablet As directed  . Cholecalciferol 1000 UNITS tablet Take 1,000 Units by mouth daily.    . Cyanocobalamin (VITAMIN B-12) 1000 MCG SUBL DISSOLVE 2 TABLETS IN MOUTH EVERY DAY  . finasteride (PROSCAR) 5 MG tablet TAKE 1 TABLET BY MOUTH EVERY DAY  . gabapentin (NEURONTIN) 300 MG capsule TAKE 1 CAPSULE (300 MG TOTAL) BY MOUTH 4 (FOUR) TIMES DAILY.  [name] HYDROcodone-acetaminophen (NORCO) 7.5-325 MG tablet Take 1 tablet by mouth every 6 (six) hours as needed for severe pain. Please fill on or after 11/07/19  . HYDROcodone-acetaminophen (NORCO) 7.5-325 MG tablet Take 1 tablet by mouth every 6 (six) hours as needed for moderate pain.  [name] HYDROcodone-acetaminophen (NORCO) 7.5-325 MG tablet Take 1 tablet by mouth every 6 (six) hours as needed for moderate pain.  [name] loratadine (CLARITIN) 10 MG tablet Take 1 tablet (10 mg total) by mouth  daily.  . meclizine (ANTIVERT) 12.5 MG tablet TAKE 1 TABLET (12.5 MG TOTAL) BY MOUTH 3 (THREE) TIMES DAILY AS NEEDED FOR DIZZINESS.  . Multiple Vitamin (MULTIVITAMIN) capsule Take 1 capsule by mouth daily.    . Multiple Vitamins-Minerals (PRESERVISION AREDS PO) Take 2 tablets by mouth 2 (two) times daily.  . pravastatin (PRAVACHOL) 20 MG tablet TAKE 1 TABLET BY MOUTH EVERY DAY  . rOPINIRole (REQUIP) 2 MG tablet TAKE 1 TABLET BY MOUTH AT 12PM, 1 TABLET AT 4PM AND 2 TABLETS AT BEDTIME  . tamsulosin (FLOMAX) 0.4 MG CAPS capsule TAKE 1 CAPSULE BY MOUTH EVERY DAY  . terazosin (HYTRIN) 2 MG capsule TAKE ONE CAPSULE BY MOUTH AT BEDTIME  . triamcinolone (NASACORT) 55 MCG/ACT AERO nasal inhaler Place 2 sprays into the nose daily.   No current facility-administered medications for this visit. (Other)      REVIEW OF SYSTEMS:    ALLERGIES Allergies  Allergen Reactions  . Levofloxacin     REACTION: hands pealed    PAST MEDICAL HISTORY Past Medical History:  Diagnosis Date  . Alcoholism ([place])    Dry 13 years  . Basal cell carcinoma    Right Chest  . Chronic insomnia    On ambien x 2 years  . Depression   . Diverticulosis of colon   .  Dizzy spells   . Elbow fracture, left 2009   [name]  . Glaucoma   . History of TIAs   . Hyperkeratosis   . LBP (low back pain)   . Melanoma [facility]) 2009   x3 [name]  . Osteoarthritis   . Restless leg syndrome   . Tinnitus   . Tobacco abuse    Past Surgical History:  Procedure Laterality Date  . CATARACT EXTRACTION    . mda     Dr. [name] wet injection  . MELANOMA EXCISION  2009   x3  . RECONSTRUCTION MEDIAL COLLATERAL LIGAMENT ELBOW W/ TENDON GRAFT  2009   Left, [name]    FAMILY HISTORY Family History  Problem Relation Age of Onset  . Cancer Mother        ?breast  . Cancer Father        Lung    SOCIAL HISTORY Social History   Tobacco Use  . Smoking status: Former Smoker    Packs/day: 3.00    Years: 38.00    Pack  years: 114.00    Types: Cigarettes    Quit date: 08/30/1990    Years since quitting: 29.1  . Smokeless tobacco: Never Used  . Tobacco comment: states he quit 1986  Substance Use Topics  . Alcohol use: No    Alcohol/week: 0.0 standard drinks    Comment: PREVIOUS - DRY 13 yrs  . Drug use: Not Currently         OPHTHALMIC EXAM:  Base Eye Exam    Visual Acuity (Snellen - Linear)      Right Left   Dist [place] 20/20-2 20/70+2   Dist ph [place]  20/60-2       Tonometry (Tonopen, 8:36 AM)      Right Left   Pressure 13 14       Pupils      Pupils Dark Light Shape React APD   Right PERRL 3 2 Round Minimal None   Left PERRL 3 2 Round Minimal None       Visual Fields (Counting fingers)      Left Right    Full Full       Extraocular Movement      Right Left    Full Full       Neuro/Psych    Oriented x3: Yes   Mood/Affect: Normal       Dilation    Left eye: 1.0% Mydriacyl, 2.5% Phenylephrine @ 8:37 AM        Slit Lamp and Fundus Exam    External Exam      Right Left   External Normal Normal       Slit Lamp Exam      Right Left   Lids/Lashes Normal Normal   Conjunctiva/Sclera White and quiet White and quiet   Cornea Clear Clear   Anterior Chamber Deep and quiet Deep and quiet   Iris Round and reactive Round and reactive   Lens Posterior chamber intraocular lens Posterior chamber intraocular lens   Anterior Vitreous Normal Normal       Fundus Exam      Right Left   Posterior Vitreous  Posterior vitreous detachment   Disc  Normal   C/D Ratio  0.6   Macula  Retinal pigment epithelial detachment, Retinal pigment epithelial mottling, Drusen, Hard drusen, Atrophy, Age related macular degeneration, Early age related macular degeneration   Vessels  Normal   Periphery  Normal  IMAGING AND PROCEDURES  Imaging and Procedures for @TODAY @  OCT, Retina - OU - Both Eyes       Right Eye Quality was good. Scan locations included subfoveal. Findings include  retinal drusen , normal observations, no SRF.   Left Eye Quality was good. Scan locations included subfoveal. Progression has improved. Findings include abnormal foveal contour, pigment epithelial detachment, choroidal neovascular membrane, no SRF.   Notes OD, no signs of CN VM for watch large drusen or nodule inferior to fovea.    OS, with smaller pigment epithelial detachment nasal to the foveal avascular zone.  Much less SRF and no CME, now on Eylea.  Repeat exam and possible Eylea in  6 weeks       Intravitreal Injection, Pharmacologic Agent - OS - Left Eye       Time Out 10/15/2019. 9:39 AM. Confirmed correct patient, procedure, site, and patient consented.   Anesthesia Topical anesthesia was used. Anesthetic medications included Akten 3.5%.   Procedure Preparation included 10% betadine to eyelids, Tobramycin 0.3%, 5% betadine to ocular surface. A 30 gauge needle was used.   Injection:  2 mg aflibercept Alfonse Flavors) SOLN   NDC: [id number], Lot: [id number]   Route: Intravitreal, Site: Left Eye, Waste: 0 mg  Post-op Post injection exam found visual acuity of at least counting fingers. The patient tolerated the procedure well. There were no complications. The patient received written and verbal post procedure care education. Post injection medications were not given.                 ASSESSMENT/PLAN:  No problem-specific Assessment & Plan notes found for this encounter.      ICD-10-CM   1. Exudative age-related macular degeneration of left eye with active choroidal neovascularization (HCC)  H35.3221 OCT, Retina - OU - Both Eyes    Intravitreal Injection, Pharmacologic Agent - OS - Left Eye    aflibercept (EYLEA) SOLN 2 mg  2. Early stage nonexudative age-related macular degeneration of left eye  H35.3121   3. Early stage nonexudative age-related macular degeneration of right eye  H35.3111   4. Pseudophakia  Z96.1   5. Posterior vitreous detachment of left eye   H43.812     1.  2.  3.  Ophthalmic Meds Ordered this visit:  Meds ordered this encounter  Medications  . aflibercept (EYLEA) SOLN 2 mg       Return in about 6 weeks (around 11/26/2019) for EYLEA OCT, OS.  Patient Instructions  The nature of wet macular degeneration was discussed with the patient.  Forms of therapy reviewed include the use of Anti-VEGF medications injected painlessly into the eye, as well as other possible treatment modalities, including thermal laser therapy. Fellow eye involvement and risks were discussed with the patient. Upon the finding of wet age related macular degeneration, treatment will be offered. The treatment regimen is on a treat as needed basis with the intent to treat if necessary and extend interval of exams when possible. On average 1 out of 6 patients do not need lifetime therapy. However, the risk of recurrent disease is high for a lifetime.  Initially monthly, then periodic, examinations and evaluations will determine whether the next treatment is required on the day of the examination.     The nature of posterior vitreous detachment was discussed with the patient as well as its physiology, its age prevalence, and its possible implication regarding retinal breaks and detachment.  An informational brochure was given to the patient.  All  the patient's questions were answered.  The patient was asked to return if new or different flashes or floaters develops.   Patient was instructed to contact office immediately if any changes were noticed. I explained to the patient that vitreous inside the eye is similar to jello inside a bowl. As the jello melts it can start to pull away from the bowl, similarly the vitreous throughout our lives can begin to pull away from the retina. That process is called a posterior vitreous detachment. In some cases, the vitreous can tug hard enough on the retina to form a retinal tear. I discussed with the patient the signs and  symptoms of a retinal detachment.  Do not rub the eye.    Explained the diagnoses, plan, and follow up with the patient and they expressed understanding.  Patient expressed understanding of the importance of proper follow up care.   Clent Demark Zoya Sprecher M.D. Diseases & Surgery of the Retina and Vitreous Retina & Diabetic [place] @TODAY @     Abbreviations: M myopia (nearsighted); A astigmatism; H hyperopia (farsighted); P presbyopia; Mrx spectacle prescription;  CTL contact lenses; OD right eye; OS left eye; OU both eyes  XT exotropia; ET esotropia; PEK punctate epithelial keratitis; PEE punctate epithelial erosions; DES dry eye syndrome; MGD meibomian gland dysfunction; ATs artificial tears; PFAT's preservative free artificial tears; [place] nuclear sclerotic cataract; PSC posterior subcapsular cataract; ERM epi-retinal membrane; PVD posterior vitreous detachment; RD retinal detachment; DM diabetes mellitus; DR diabetic retinopathy; NPDR non-proliferative diabetic retinopathy; PDR proliferative diabetic retinopathy; CSME clinically significant macular edema; DME diabetic macular edema; dbh dot blot hemorrhages; CWS cotton wool spot; POAG primary open angle glaucoma; C/D cup-to-disc ratio; HVF humphrey visual field; GVF goldmann visual field; OCT optical coherence tomography; IOP intraocular pressure; BRVO Branch retinal vein occlusion; CRVO central retinal vein occlusion; CRAO central retinal artery occlusion; BRAO branch retinal artery occlusion; RT retinal tear; SB scleral buckle; PPV pars plana vitrectomy; VH Vitreous hemorrhage; PRP panretinal laser photocoagulation; IVK intravitreal kenalog; VMT vitreomacular traction; MH Macular hole;  NVD neovascularization of the disc; NVE neovascularization elsewhere; AREDS age related eye disease study; ARMD age related macular degeneration; POAG primary open angle glaucoma; EBMD epithelial/anterior basement membrane dystrophy; ACIOL anterior chamber intraocular  lens; IOL intraocular lens; PCIOL posterior chamber intraocular lens; Phaco/IOL phacoemulsification with intraocular lens placement; [place] photorefractive keratectomy; LASIK laser assisted in situ keratomileusis; HTN hypertension; DM diabetes mellitus; COPD chronic obstructive pulmonary disease

## 2019-10-15 NOTE — Patient Instructions (Signed)
The nature of wet macular degeneration was discussed with the patient.  Forms of therapy reviewed include the use of Anti-VEGF medications injected painlessly into the eye, as well as other possible treatment modalities, including thermal laser therapy. Fellow eye involvement and risks were discussed with the patient. Upon the finding of wet age related macular degeneration, treatment will be offered. The treatment regimen is on a treat as needed basis with the intent to treat if necessary and extend interval of exams when possible. On average 1 out of 6 patients do not need lifetime therapy. However, the risk of recurrent disease is high for a lifetime.  Initially monthly, then periodic, examinations and evaluations will determine whether the next treatment is required on the day of the examination.    The nature of posterior vitreous detachment was discussed with the patient as well as its physiology, its age prevalence, and its possible implication regarding retinal breaks and detachment.  An informational brochure was given to the patient.  All the patient's questions were answered.  The patient was asked to return if new or different flashes or floaters develops.   Patient was instructed to contact office immediately if any changes were noticed. I explained to the patient that vitreous inside the eye is similar to jello inside a bowl. As the jello melts it can start to pull away from the bowl, similarly the vitreous throughout our lives can begin to pull away from the retina. That process is called a posterior vitreous detachment. In some cases, the vitreous can tug hard enough on the retina to form a retinal tear. I discussed with the patient the signs and symptoms of a retinal detachment.  Do not rub the eye. 

## 2019-10-16 ENCOUNTER — Telehealth (INDEPENDENT_AMBULATORY_CARE_PROVIDER_SITE_OTHER): Payer: PPO | Admitting: Neurology

## 2019-10-16 ENCOUNTER — Ambulatory Visit (INDEPENDENT_AMBULATORY_CARE_PROVIDER_SITE_OTHER): Payer: PPO | Admitting: Internal Medicine

## 2019-10-16 ENCOUNTER — Encounter: Payer: Self-pay | Admitting: Neurology

## 2019-10-16 ENCOUNTER — Other Ambulatory Visit: Payer: Self-pay

## 2019-10-16 ENCOUNTER — Encounter: Payer: Self-pay | Admitting: Internal Medicine

## 2019-10-16 VITALS — BP 162/86 | HR 76 | Temp 98.0°F | Ht 72.0 in | Wt 212.0 lb

## 2019-10-16 VITALS — Ht 72.0 in | Wt 205.0 lb

## 2019-10-16 DIAGNOSIS — G629 Polyneuropathy, unspecified: Secondary | ICD-10-CM

## 2019-10-16 DIAGNOSIS — R21 Rash and other nonspecific skin eruption: Secondary | ICD-10-CM | POA: Insufficient documentation

## 2019-10-16 DIAGNOSIS — G2581 Restless legs syndrome: Secondary | ICD-10-CM

## 2019-10-16 DIAGNOSIS — M7989 Other specified soft tissue disorders: Secondary | ICD-10-CM

## 2019-10-16 LAB — COMPREHENSIVE METABOLIC PANEL
ALT: 13 U/L (ref 0–53)
AST: 21 U/L (ref 0–37)
Albumin: 4.1 g/dL (ref 3.5–5.2)
Alkaline Phosphatase: 58 U/L (ref 39–117)
BUN: 21 mg/dL (ref 6–23)
CO2: 30 mEq/L (ref 19–32)
Calcium: 8.7 mg/dL (ref 8.4–10.5)
Chloride: 107 mEq/L (ref 96–112)
Creatinine, Ser: 1.15 mg/dL (ref 0.40–1.50)
GFR: 61.12 mL/min (ref >60.00)
Glucose, Bld: 107 mg/dL — ABNORMAL HIGH (ref 70–99)
Potassium: 4.2 mEq/L (ref 3.5–5.1)
Sodium: 141 mEq/L (ref 135–145)
Total Bilirubin: 0.4 mg/dL (ref 0.2–1.2)
Total Protein: 6.5 g/dL (ref 6.0–8.3)

## 2019-10-16 LAB — CBC
HCT: 30 % — ABNORMAL LOW (ref 39.0–52.0)
Hemoglobin: 10.3 g/dL — ABNORMAL LOW (ref 13.0–17.0)
MCHC: 34.2 g/dL (ref 30.0–36.0)
MCV: 91.5 fl (ref 78.0–100.0)
Platelets: 160 10*3/uL (ref 150.0–400.0)
RBC: 3.28 Mil/uL — ABNORMAL LOW (ref 4.22–5.81)
RDW: 13.8 % (ref 11.5–15.5)
WBC: 5.1 10*3/uL (ref 4.0–10.5)

## 2019-10-16 LAB — BRAIN NATRIURETIC PEPTIDE: Pro B Natriuretic peptide (BNP): 226 pg/mL — ABNORMAL HIGH (ref 0.0–100.0)

## 2019-10-16 LAB — T4, FREE: Free T4: 0.92 ng/dL (ref 0.60–1.60)

## 2019-10-16 LAB — TSH: TSH: 3.05 u[IU]/mL (ref 0.35–4.50)

## 2019-10-16 MED ORDER — TRIAMCINOLONE ACETONIDE 0.1 % EX CREA: 1.0000 "application " | TOPICAL_CREAM | Freq: Two times a day (BID) | CUTANEOUS | 0 refills | Status: DC

## 2019-10-16 MED ORDER — CARBIDOPA-LEVODOPA 25-100 MG PO TABS: ORAL_TABLET | ORAL | 5 refills | Status: DC

## 2019-10-16 NOTE — Progress Notes (Signed)
   Subjective:   Patient ID: Justin Pagan Su Grand., male    DOB: 1941/05/17, 79 y.o.   MRN: [id number]  HPI The patient is an 79 YO man coming in for rash on the left lower leg. Noticed it after a shower about 1-2 days ago. Denies injury or fall to that area. Some swelling in that leg and he denies realizing it was swollen or if it has been swollen a long time or recently. Denies SOB with exertion. Denies pain or itching in the area of rash. Denies new soap, lotion, detergent. Denies outdoor activities or exposures. No fevers or chills. Got rx for new medication but had not taken it yet. No other new medications or dosage changes. Overall he is not sure if this is changing since onset. Used antibiotic ointment which did not help much.   Review of Systems  Constitutional: Negative.   HENT: Negative.   Eyes: Negative.   Respiratory: Negative for cough, chest tightness and shortness of breath.   Cardiovascular: Negative for chest pain, palpitations and leg swelling.  Gastrointestinal: Negative for abdominal distention, abdominal pain, constipation, diarrhea, nausea and vomiting.  Musculoskeletal: Negative.   Skin: Positive for rash.  Neurological: Negative.   Psychiatric/Behavioral: Negative.     Objective:  Physical Exam Constitutional:      Appearance: He is well-developed.  HENT:     Head: Normocephalic and atraumatic.  Cardiovascular:     Rate and Rhythm: Normal rate and regular rhythm.  Pulmonary:     Effort: Pulmonary effort is normal. No respiratory distress.     Breath sounds: Normal breath sounds. No wheezing or rales.  Abdominal:     General: Bowel sounds are normal. There is no distension.     Palpations: Abdomen is soft.     Tenderness: There is no abdominal tenderness. There is no rebound.  Musculoskeletal:     Cervical back: Normal range of motion.  Skin:    General: Skin is warm and dry.     Findings: Rash present.     Comments: Non-blanching flat rash on the left  shin. Left leg 1-2+ pitting edema to the knee which is not present on the right. Not typical for cellulitis. No calf tenderness or tenderness over the rash.   Neurological:     Mental Status: He is alert and oriented to person, place, and time.     Coordination: Coordination normal.     Vitals:   10/16/19 1503  BP: (!) 162/86  Pulse: 76  Temp: 98 F (36.7 C)  TempSrc: Oral  SpO2: 96%  Weight: 212 lb (96.2 kg)  Height: 6' (1.829 m)    This visit occurred during the SARS-CoV-2 public health emergency.  Safety protocols were in place, including screening questions prior to the visit, additional usage of staff PPE, and extensive cleaning of exam room while observing appropriate contact time as indicated for disinfecting solutions.   Assessment & Plan:

## 2019-10-16 NOTE — Assessment & Plan Note (Signed)
Rx triamcinolone. Not convincing for cellulitis on exam or history. Checking CBC, CMP, TSH, BNP for etiology of swelling and rash. It is non-blanching on exam. No tenderness on the leg but if labs not revealing may need doppler to rule out DVT. No recent immunizations or medication changes.

## 2019-10-16 NOTE — Progress Notes (Signed)
New Patient Virtual Visit via Video Note The purpose of this virtual visit is to provide medical care while limiting exposure to the novel coronavirus.    Consent was obtained for video visit:  Yes.   Answered questions that patient had about telehealth interaction:  Yes.   I discussed the limitations, risks, security and privacy concerns of performing an evaluation and management service by telemedicine. I also discussed with the patient that there may be a patient responsible charge related to this service. The patient expressed understanding and agreed to proceed.  Pt location: Home Physician Location: office Name of referring provider:  Plotnikov, Evie Lacks, MD I connected with [place]. at patients initiation/request on 10/16/2019 at  8:10 AM EDT by video enabled telemedicine application and verified that I am speaking with the correct person using two identifiers. Pt MRN:  [id number] Pt DOB:  [birth year] Video Participants:  [place].    History of Present Illness: Justin [name]. is a 79 y.o. male with BPH, history of alcohol use, and depression presenting for evaluation of restless leg syndrome.  For the past year, he has noticed numbness, tingling, and stabbing pain in the feet and lower legs.  He endorses imbalance, but walks unassisted and has not suffered any falls.  No weakness in the legs.  He also has pain and discomfort which keeps him awake at night time, usually 4-5 nights per week or when he is trying to lay down to take a nap. He takes gabapentin 300mg  at noon, 300mg  at 6pm, and 1200mg  at bedtime. He takes ropinirole 2mg  at noon, 2mg  4pm, and 4mg  at bedtime. He also takes hydrocodone for pain. Nothing provides any significant relief of symptoms.  He has history of about 10-year heavy alcohol use and has been sober since 1996.  Out-side paper records, electronic medical record, and images have been reviewed where available and summarized  as:  Lab Results  Component Value Date   HGBA1C 5.6 07/28/2015   Lab Results  Component Value Date   VITAMINB12 >1500 (H) 08/30/2016   Lab Results  Component Value Date   TSH 2.83 07/31/2017   Lab Results  Component Value Date   ESRSEDRATE 23 (H) 03/01/2010   Lab Results  Component Value Date   CREATININE 1.16 07/31/2017   BUN 22 07/31/2017   NA 140 07/31/2017   K 4.3 07/31/2017   CL 107 07/31/2017   CO2 26 07/31/2017     Past Medical History:  Diagnosis Date  . Alcoholism ([place])    Dry 13 years  . Basal cell carcinoma    Right Chest  . Chronic insomnia    On ambien x 2 years  . Depression   . Diverticulosis of colon   . Dizzy spells   . Elbow fracture, left 2009   [name]  . Glaucoma   . History of TIAs   . Hyperkeratosis   . LBP (low back pain)   . Melanoma [facility]) 2009   x3 [name]  . Osteoarthritis   . Restless leg syndrome   . Tinnitus   . Tobacco abuse     Past Surgical History:  Procedure Laterality Date  . CATARACT EXTRACTION    . mda     Dr. [name] wet injection  . MELANOMA EXCISION  2009   x3  . RECONSTRUCTION MEDIAL COLLATERAL LIGAMENT ELBOW W/ TENDON GRAFT  2009   Left, [name]     Medications:  Outpatient Encounter Medications as of 10/16/2019  Medication Sig Note  . aspirin 81 MG EC tablet Take 81 mg by mouth daily.     . Cholecalciferol 1000 UNITS tablet Take 3,000 Units by mouth daily.    . Cyanocobalamin (VITAMIN B-12) 1000 MCG SUBL DISSOLVE 2 TABLETS IN MOUTH EVERY DAY   . finasteride (PROSCAR) 5 MG tablet TAKE 1 TABLET BY MOUTH EVERY DAY   . gabapentin (NEURONTIN) 300 MG capsule TAKE 1 CAPSULE (300 MG TOTAL) BY MOUTH 4 (FOUR) TIMES DAILY.   [name] HYDROcodone-acetaminophen (NORCO) 7.5-325 MG tablet Take 1 tablet by mouth every 6 (six) hours as needed for severe pain. Please fill on or after 11/07/19   . HYDROcodone-acetaminophen (NORCO) 7.5-325 MG tablet Take 1 tablet by mouth every 6 (six) hours as needed for moderate pain.  [id number]: duplicate  . HYDROcodone-acetaminophen (NORCO) 7.5-325 MG tablet Take 1 tablet by mouth every 6 (six) hours as needed for moderate pain.   . meclizine (ANTIVERT) 12.5 MG tablet TAKE 1 TABLET (12.5 MG TOTAL) BY MOUTH 3 (THREE) TIMES DAILY AS NEEDED FOR DIZZINESS.   . Multiple Vitamin (MULTIVITAMIN) capsule Take 1 capsule by mouth daily.     . Multiple Vitamins-Minerals (PRESERVISION AREDS PO) Take 2 tablets by mouth 2 (two) times daily.   . pravastatin (PRAVACHOL) 20 MG tablet TAKE 1 TABLET BY MOUTH EVERY DAY   . rOPINIRole (REQUIP) 2 MG tablet TAKE 1 TABLET BY MOUTH AT 12PM, 1 TABLET AT 4PM AND 2 TABLETS AT BEDTIME   . tamsulosin (FLOMAX) 0.4 MG CAPS capsule TAKE 1 CAPSULE BY MOUTH EVERY DAY   . terazosin (HYTRIN) 2 MG capsule TAKE ONE CAPSULE BY MOUTH AT BEDTIME   . [DISCONTINUED] azithromycin (ZITHROMAX Z-PAK) 250 MG tablet As directed (Patient not taking: Reported on 10/15/2019)   . [DISCONTINUED] loratadine (CLARITIN) 10 MG tablet Take 1 tablet (10 mg total) by mouth daily. (Patient not taking: Reported on 10/15/2019)   . [DISCONTINUED] triamcinolone (NASACORT) 55 MCG/ACT AERO nasal inhaler Place 2 sprays into the nose daily. (Patient not taking: Reported on 10/15/2019)    No facility-administered encounter medications on file as of 10/16/2019.    Allergies:  Allergies  Allergen Reactions  . Levofloxacin     REACTION: hands pealed    Family History: Family History  Problem Relation Age of Onset  . Cancer Mother        ?breast  . Cancer Father        Lung    Social History: Social History   Tobacco Use  . Smoking status: Former Smoker    Packs/day: 3.00    Years: 38.00    Pack years: 114.00    Types: Cigarettes    Quit date: 08/30/1990    Years since quitting: 29.1  . Smokeless tobacco: Never Used  . Tobacco comment: states he quit 1986  Substance Use Topics  . Alcohol use: No    Alcohol/week: 0.0 standard drinks    Comment: PREVIOUS - DRY 13 yrs  . Drug use:  Not Currently   Social History   Social History Narrative   Right handed   One story home   Drinks coffee q am    Vital Signs:  Ht 6' (1.829 m)   Wt 205 lb (93 kg)   BMI 27.80 kg/m    General Medical Exam:  Well appearing, comfortable.  Nonlabored breathing.    Neurological Exam: MENTAL STATUS including orientation to time, place, person, recent and remote memory, attention span and  concentration, language, and fund of knowledge is normal.  Speech is not dysarthric.  CRANIAL NERVES:  Normal conjugate, extra-ocular eye movements in all directions of gaze.  No ptosis.  Normal facial symmetry and movements.  Normal shoulder shrug and head rotation.  Tongue is midline.  MOTOR:  Antigravity in all extremities.  No abnormal movements.  No pronator drift.   SENSORY: Unable to assess  COORDINATION/GAIT: Normal finger to nose bilaterally.  Intact rapid alternating movements bilaterally.  Gait is mildly wide-based, stable and unassisted.  IMPRESSION: 1.  Peripheral neuropathy contributed by age and history of alcohol abuse causing painful paresthesias and imbalance  - Continue gabapentin 300mg  in the afternoon, 4pm, and 600mg  at bedtime.  OK to titrate this to 600mg  TID, if needed   - Patient educated on daily foot inspection, fall prevention, and safety precautions around the home.  2.  Restless leg syndrome  - He is taking ropinirole 8mg /d, which is higher than typically used for RLS symptoms, so I will reduce the dose to be sure worsening symptoms are not due to augmentation  - Reduce ropinirole to 1mg  at noon, continue 2mg  at 4pm, and 4mg  at bedtime x 1 week, then stop noon dose  - Start sinemet 25/100 1 tablet for breakthrough pain  - Check ferritin at next visit  Follow Up Instructions:  I discussed the assessment and treatment plan with the patient. The patient was provided an opportunity to ask questions and all were answered. The patient agreed with the plan and demonstrated  an understanding of the instructions.   The patient was advised to call back or seek an in-person evaluation if the symptoms worsen or if the condition fails to improve as anticipated.  Return to clinic in 4-5 months   Alda Berthold, DO

## 2019-10-16 NOTE — Patient Instructions (Signed)
We have sent in a cream triamcinolone to use twice a day and let us know in 1-2 days if the area is growing or shrinking.   We are checking some blood work today and will get back with you on the results.

## 2019-10-17 ENCOUNTER — Telehealth: Payer: Self-pay

## 2019-10-17 ENCOUNTER — Other Ambulatory Visit: Payer: Self-pay | Admitting: Internal Medicine

## 2019-10-17 DIAGNOSIS — R609 Edema, unspecified: Secondary | ICD-10-CM

## 2019-10-17 NOTE — Telephone Encounter (Signed)
F/u  The patient calling back to see has Dr. Sharlet Salina seen his previous messages.   The patient is aware that Dr. Sharlet Salina is in the clinic today.

## 2019-10-17 NOTE — Telephone Encounter (Signed)
.   New message   The patient wants to why Dr. Sharlet Salina wants him to have an EKG asking for a callback

## 2019-10-20 NOTE — Telephone Encounter (Signed)
Please advise 

## 2019-10-21 NOTE — Telephone Encounter (Signed)
See result notes but I want him to have an ultrasound of the heart or an echocardiogram. This is not the same as an EKG.

## 2019-10-21 NOTE — Telephone Encounter (Signed)
Called patient he has an appointment scheduled for an echocardiogram on 10-22-2019

## 2019-10-22 ENCOUNTER — Other Ambulatory Visit: Payer: Self-pay

## 2019-10-22 ENCOUNTER — Ambulatory Visit (HOSPITAL_COMMUNITY): Payer: PPO | Attending: Cardiology

## 2019-10-22 DIAGNOSIS — R600 Localized edema: Secondary | ICD-10-CM

## 2019-10-22 DIAGNOSIS — R609 Edema, unspecified: Secondary | ICD-10-CM | POA: Insufficient documentation

## 2019-10-22 DIAGNOSIS — I071 Rheumatic tricuspid insufficiency: Secondary | ICD-10-CM | POA: Insufficient documentation

## 2019-10-24 ENCOUNTER — Ambulatory Visit: Payer: PPO | Admitting: Neurology

## 2019-10-27 ENCOUNTER — Ambulatory Visit (INDEPENDENT_AMBULATORY_CARE_PROVIDER_SITE_OTHER): Payer: PPO | Admitting: Internal Medicine

## 2019-10-27 ENCOUNTER — Other Ambulatory Visit: Payer: Self-pay

## 2019-10-27 ENCOUNTER — Encounter: Payer: Self-pay | Admitting: Internal Medicine

## 2019-10-27 DIAGNOSIS — R601 Generalized edema: Secondary | ICD-10-CM

## 2019-10-27 DIAGNOSIS — I5189 Other ill-defined heart diseases: Secondary | ICD-10-CM | POA: Diagnosis not present

## 2019-10-27 DIAGNOSIS — G2581 Restless legs syndrome: Secondary | ICD-10-CM

## 2019-10-27 DIAGNOSIS — I2510 Atherosclerotic heart disease of native coronary artery without angina pectoris: Secondary | ICD-10-CM | POA: Diagnosis not present

## 2019-10-27 LAB — BASIC METABOLIC PANEL
BUN: 24 mg/dL — ABNORMAL HIGH (ref 6–23)
CO2: 28 mEq/L (ref 19–32)
Calcium: 9.2 mg/dL (ref 8.4–10.5)
Chloride: 105 mEq/L (ref 96–112)
Creatinine, Ser: 1.21 mg/dL (ref 0.40–1.50)
GFR: 57.63 mL/min — ABNORMAL LOW (ref >60.00)
Glucose, Bld: 94 mg/dL (ref 70–99)
Potassium: 4.4 mEq/L (ref 3.5–5.1)
Sodium: 139 mEq/L (ref 135–145)

## 2019-10-27 LAB — BRAIN NATRIURETIC PEPTIDE: Pro B Natriuretic peptide (BNP): 234 pg/mL — ABNORMAL HIGH (ref 0.0–100.0)

## 2019-10-27 MED ORDER — FUROSEMIDE 20 MG PO TABS: 20.0000 mg | ORAL_TABLET | Freq: Every day | ORAL | 3 refills | Status: DC

## 2019-10-27 NOTE — Progress Notes (Signed)
Subjective:  Patient ID: Justin Pagan Su Grand., male    DOB: 07-Oct-1940  Age: 79 y.o. MRN: [id number]  CC: No chief complaint on file.   HPI Justin [name] [employer]. presents for LLE swelling, pain x 1 week - better F/u ECHO report, RLS  Outpatient Medications Prior to Visit  Medication Sig Dispense Refill  . aspirin 81 MG EC tablet Take 81 mg by mouth daily.      . carbidopa-levodopa (SINEMET IR) 25-100 MG tablet For breakthrough restless leg, you can take 1 tablet. 30 tablet 5  . Cholecalciferol 1000 UNITS tablet Take 3,000 Units by mouth daily.     . Cyanocobalamin (VITAMIN B-12) 1000 MCG SUBL DISSOLVE 2 TABLETS IN MOUTH EVERY DAY 60 tablet 8  . finasteride (PROSCAR) 5 MG tablet TAKE 1 TABLET BY MOUTH EVERY DAY 90 tablet 1  . gabapentin (NEURONTIN) 300 MG capsule TAKE 1 CAPSULE (300 MG TOTAL) BY MOUTH 4 (FOUR) TIMES DAILY. 360 capsule 1  . HYDROcodone-acetaminophen (NORCO) 7.5-325 MG tablet Take 1 tablet by mouth every 6 (six) hours as needed for severe pain. Please fill on or after 11/07/19 120 tablet 0  . HYDROcodone-acetaminophen (NORCO) 7.5-325 MG tablet Take 1 tablet by mouth every 6 (six) hours as needed for moderate pain. 120 tablet 0  . HYDROcodone-acetaminophen (NORCO) 7.5-325 MG tablet Take 1 tablet by mouth every 6 (six) hours as needed for moderate pain. 120 tablet 0  . meclizine (ANTIVERT) 12.5 MG tablet TAKE 1 TABLET (12.5 MG TOTAL) BY MOUTH 3 (THREE) TIMES DAILY AS NEEDED FOR DIZZINESS. 60 tablet 1  . Multiple Vitamin (MULTIVITAMIN) capsule Take 1 capsule by mouth daily.      . Multiple Vitamins-Minerals (PRESERVISION AREDS PO) Take 2 tablets by mouth 2 (two) times daily.    . pravastatin (PRAVACHOL) 20 MG tablet TAKE 1 TABLET BY MOUTH EVERY DAY 90 tablet 1  . rOPINIRole (REQUIP) 2 MG tablet TAKE 1 TABLET BY MOUTH AT 12PM, 1 TABLET AT 4PM AND 2 TABLETS AT BEDTIME 120 tablet 5  . tamsulosin (FLOMAX) 0.4 MG CAPS capsule TAKE 1 CAPSULE BY MOUTH EVERY DAY 90 capsule 3  .  terazosin (HYTRIN) 2 MG capsule TAKE ONE CAPSULE BY MOUTH AT BEDTIME 90 capsule 3  . triamcinolone cream (KENALOG) 0.1 % Apply 1 application topically 2 (two) times daily. 100 g 0   No facility-administered medications prior to visit.    ROS: Review of Systems  Constitutional: Negative for appetite change, fatigue and unexpected weight change.  HENT: Negative for congestion, nosebleeds, sneezing, sore throat and trouble swallowing.   Eyes: Negative for itching and visual disturbance.  Respiratory: Negative for cough.   Cardiovascular: Positive for leg swelling. Negative for chest pain and palpitations.  Gastrointestinal: Negative for abdominal distention, blood in stool, diarrhea and nausea.  Genitourinary: Negative for frequency and hematuria.  Musculoskeletal: Positive for arthralgias. Negative for back pain, gait problem, joint swelling and neck pain.  Skin: Positive for color change and rash.  Neurological: Negative for dizziness, tremors, speech difficulty and weakness.  Psychiatric/Behavioral: Negative for agitation, dysphoric mood and sleep disturbance. The patient is not nervous/anxious.     Objective:  BP (!) 154/72 (BP Location: Right Arm, Patient Position: Sitting, Cuff Size: Large)   Pulse 67   Temp 98.1 F (36.7 C) (Oral)   Ht 6' (1.829 m)   Wt 206 lb (93.4 kg)   SpO2 95%   BMI 27.94 kg/m   BP Readings from Last 3 Encounters:  10/27/19 [name])  154/72  10/16/19 (!) 162/86  09/08/19 (!) 144/86    Wt Readings from Last 3 Encounters:  10/27/19 206 lb (93.4 kg)  10/16/19 212 lb (96.2 kg)  10/16/19 205 lb (93 kg)    Physical Exam Constitutional:      General: He is not in acute distress.    Appearance: He is well-developed.     Comments: NAD  Eyes:     Conjunctiva/sclera: Conjunctivae normal.     Pupils: Pupils are equal, round, and reactive to light.  Neck:     Thyroid: No thyromegaly.     Vascular: No JVD.  Cardiovascular:     Rate and Rhythm: Normal rate  and regular rhythm.     Heart sounds: Normal heart sounds. No murmur. No friction rub. No gallop.   Pulmonary:     Effort: Pulmonary effort is normal. No respiratory distress.     Breath sounds: Normal breath sounds. No wheezing or rales.  Chest:     Chest wall: No tenderness.  Abdominal:     General: Bowel sounds are normal. There is no distension.     Palpations: Abdomen is soft. There is no mass.     Tenderness: There is no abdominal tenderness. There is no guarding or rebound.  Musculoskeletal:        General: Swelling and tenderness present. Normal range of motion.     Cervical back: Normal range of motion.  Lymphadenopathy:     Cervical: No cervical adenopathy.  Skin:    General: Skin is warm and dry.     Findings: Erythema present. No rash.  Neurological:     Mental Status: He is alert and oriented to person, place, and time.     Cranial Nerves: No cranial nerve deficit.     Motor: No abnormal muscle tone.     Coordination: Coordination normal.     Gait: Gait normal.     Deep Tendon Reflexes: Reflexes are normal and symmetric.  Psychiatric:        Behavior: Behavior normal.        Thought Content: Thought content normal.        Judgment: Judgment normal.   L anterolat leg - tender; 1-2+ edema Rt leg - not tender; trace-1+ edema   Lab Results  Component Value Date   WBC 5.1 10/16/2019   HGB 10.3 (L) 10/16/2019   HCT 30.0 (L) 10/16/2019   PLT 160.0 10/16/2019   GLUCOSE 107 (H) 10/16/2019   CHOL 136 01/12/2017   TRIG 67.0 01/12/2017   HDL 54.70 01/12/2017   LDLCALC 68 01/12/2017   ALT 13 10/16/2019   AST 21 10/16/2019   NA 141 10/16/2019   K 4.2 10/16/2019   CL 107 10/16/2019   CREATININE 1.15 10/16/2019   BUN 21 10/16/2019   CO2 30 10/16/2019   TSH 3.05 10/16/2019   PSA 0.47 08/30/2016   HGBA1C 5.6 07/28/2015    DG Chest 2 View  Result Date: 11/19/2018 CLINICAL DATA:  Anterior chest pain for 1 month EXAM: CHEST - 2 VIEW COMPARISON:  08/02/2017  FINDINGS: The heart size and mediastinal contours are within normal limits. Both lungs are clear. The visualized skeletal structures are unremarkable. IMPRESSION: No active cardiopulmonary disease. Electronically Signed   By: Justin Catalina M.D.   On: 11/19/2018 14:58    Assessment & Plan:   There are no diagnoses linked to this encounter.   No orders of the defined types were placed in this encounter.  Follow-up: No follow-ups on file.  [name], MD

## 2019-10-27 NOTE — Assessment & Plan Note (Signed)
mild by ECHO Furosemide BNP

## 2019-10-27 NOTE — Assessment & Plan Note (Signed)
No angina 

## 2019-10-27 NOTE — Assessment & Plan Note (Signed)
Recurrent ?etiol Furosemide prn D dimer L anterolat leg - tender; 1-2+ edema Rt leg - not tender; trace-1+ edema

## 2019-10-27 NOTE — Patient Instructions (Signed)
Elevate legs Compression knee hight

## 2019-10-27 NOTE — Assessment & Plan Note (Signed)
Sinemet F/u w/Dr Posey Pronto

## 2019-10-28 ENCOUNTER — Other Ambulatory Visit: Payer: Self-pay | Admitting: Internal Medicine

## 2019-10-28 DIAGNOSIS — R600 Localized edema: Secondary | ICD-10-CM

## 2019-10-28 LAB — D-DIMER, QUANTITATIVE: D-Dimer, Quant: 0.94 mcg/mL FEU — ABNORMAL HIGH (ref <0.50)

## 2019-10-28 NOTE — Progress Notes (Signed)
ven duplex

## 2019-10-29 ENCOUNTER — Ambulatory Visit (HOSPITAL_COMMUNITY)
Admission: RE | Admit: 2019-10-29 | Discharge: 2019-10-29 | Disposition: A | Discharge status: Home / Self Care | Payer: PPO | Source: Ambulatory Visit | Attending: Internal Medicine | Admitting: Internal Medicine

## 2019-10-29 ENCOUNTER — Other Ambulatory Visit: Payer: Self-pay

## 2019-10-29 DIAGNOSIS — R600 Localized edema: Secondary | ICD-10-CM | POA: Insufficient documentation

## 2019-10-29 NOTE — Progress Notes (Signed)
Bilateral lower extremity venous duplex completed. Refer to "CV Proc" under chart review to view preliminary results.  10/29/2019 1:24 PM Kelby Aline., MHA, RVT, RDCS, RDMS

## 2019-11-10 ENCOUNTER — Other Ambulatory Visit: Payer: Self-pay

## 2019-11-10 ENCOUNTER — Ambulatory Visit: Payer: PPO | Admitting: Podiatry

## 2019-11-10 VITALS — Temp 97.7°F

## 2019-11-10 DIAGNOSIS — Q828 Other specified congenital malformations of skin: Secondary | ICD-10-CM | POA: Diagnosis not present

## 2019-11-10 DIAGNOSIS — M79675 Pain in left toe(s): Secondary | ICD-10-CM | POA: Diagnosis not present

## 2019-11-10 DIAGNOSIS — G629 Polyneuropathy, unspecified: Secondary | ICD-10-CM | POA: Diagnosis not present

## 2019-11-10 DIAGNOSIS — B351 Tinea unguium: Secondary | ICD-10-CM | POA: Diagnosis not present

## 2019-11-10 DIAGNOSIS — M79674 Pain in right toe(s): Secondary | ICD-10-CM

## 2019-11-11 NOTE — Progress Notes (Signed)
Subjective: 79 y.o. returns the office today for painful, elongated, thickened toenails which he cannot trim himself.  He also has a painful corn second toe on the left foot which make it difficult to wear shoes.  He denies any open sores and denies any redness or drainage or any swelling.  Since I last saw him he without the increase on his left lower extremity and had a venous duplex which was negative for DVT.  He was given Lasix he took for couple days but stopped. Denies any redness, drainage or signs of infection. Denies any systemic complaints such as fevers, chills, nausea, vomiting.   PCP: Plotnikov, Evie Lacks, MD  Objective: AAO 3, NAD DP/PT pulses palpable, CRT less than 3 seconds Neurological status is unchanged.  Nails hypertrophic, dystrophic, elongated, brittle, discolored 10. There is tenderness overlying the nails 1-5 bilaterally. There is no surrounding erythema or drainage along the nail sites. Hyperkeratotic lesion sharply debrided x 1 without any complications or bleeding.  Pitting edema present bilaterally. There is deformity present the left third toe and the toe is rigid contracture at the level of the DIPJ.   No open sores, swelling. No other areas of tenderness bilateral lower extremities. No overlying edema, erythema, increased warmth. Hammertoes present.  No pain with calf compression, swelling, warmth, erythema.  Assessment: Patient presents with symptomatic onychomycosis; hyperkeratotic lesion; left third toe deformity  Plan: -Treatment options including alternatives, risks, complications were discussed -Nails sharply debrided 10 without complication/bleeding. -Hyperkeratotic lesion sharply debrided x 1 without any complications or bleeding. Continue offloading to the toes.  -Encouraged him to take Lasix as prescribed if not improving follow-up with his primary care physician given the pitting edema present bilaterally.  He has no chest pain or shortness of  breath. -Discussed daily foot inspection. If there are any changes, to call the office immediately.  -Follow-up in 9 weeks or sooner if any problems are to arise. In the meantime, encouraged to call the office with any questions, concerns, changes symptoms.  [name], DPM

## 2019-11-17 DIAGNOSIS — D2371 Other benign neoplasm of skin of right lower limb, including hip: Secondary | ICD-10-CM | POA: Diagnosis not present

## 2019-11-17 DIAGNOSIS — L570 Actinic keratosis: Secondary | ICD-10-CM | POA: Diagnosis not present

## 2019-11-17 DIAGNOSIS — Z85820 Personal history of malignant melanoma of skin: Secondary | ICD-10-CM | POA: Diagnosis not present

## 2019-11-17 DIAGNOSIS — D1801 Hemangioma of skin and subcutaneous tissue: Secondary | ICD-10-CM | POA: Diagnosis not present

## 2019-11-17 DIAGNOSIS — D225 Melanocytic nevi of trunk: Secondary | ICD-10-CM | POA: Diagnosis not present

## 2019-11-17 DIAGNOSIS — L821 Other seborrheic keratosis: Secondary | ICD-10-CM | POA: Diagnosis not present

## 2019-11-17 DIAGNOSIS — Z85828 Personal history of other malignant neoplasm of skin: Secondary | ICD-10-CM | POA: Diagnosis not present

## 2019-11-21 ENCOUNTER — Other Ambulatory Visit: Payer: Self-pay | Admitting: Internal Medicine

## 2019-11-23 ENCOUNTER — Other Ambulatory Visit: Payer: Self-pay | Admitting: Internal Medicine

## 2019-11-26 ENCOUNTER — Encounter (INDEPENDENT_AMBULATORY_CARE_PROVIDER_SITE_OTHER): Payer: Self-pay | Admitting: Ophthalmology

## 2019-11-26 ENCOUNTER — Ambulatory Visit (INDEPENDENT_AMBULATORY_CARE_PROVIDER_SITE_OTHER): Payer: PPO | Admitting: Ophthalmology

## 2019-11-26 ENCOUNTER — Other Ambulatory Visit: Payer: Self-pay

## 2019-11-26 ENCOUNTER — Encounter (INDEPENDENT_AMBULATORY_CARE_PROVIDER_SITE_OTHER): Payer: PPO | Admitting: Ophthalmology

## 2019-11-26 DIAGNOSIS — H353221 Exudative age-related macular degeneration, left eye, with active choroidal neovascularization: Secondary | ICD-10-CM

## 2019-11-26 DIAGNOSIS — H353121 Nonexudative age-related macular degeneration, left eye, early dry stage: Secondary | ICD-10-CM | POA: Diagnosis not present

## 2019-11-26 MED ORDER — AFLIBERCEPT 2MG/0.05ML IZ SOLN FOR KALEIDOSCOPE
2.0000 mg | INTRAVITREAL | Status: AC | PRN
  Administered 2019-11-26: 2 mg via INTRAVITREAL

## 2019-11-26 NOTE — Progress Notes (Signed)
11/26/2019     CHIEF COMPLAINT Patient presents for Retina Follow Up   HISTORY OF PRESENT ILLNESS: Justin [name]. is a 79 y.o. male who presents to the clinic today for:   HPI    Retina Follow Up    Patient presents with  Wet AMD.  In left eye.  Severity is moderate.  Duration of 6 weeks.  Since onset it is stable.  I, the attending physician,  performed the HPI with the patient and updated documentation appropriately.          Comments    6 Week AMD f\u OU. Possible Eylea OS. OCT  Pt states vision is stable. Denies any complaints.       Last edited by Tilda Franco on 11/26/2019  8:43 AM. (History)      Referring physician: [name], MD [place],  [place] [id number]  HISTORICAL INFORMATION:   Selected notes from the MEDICAL RECORD NUMBER    Lab Results  Component Value Date   HGBA1C 5.6 07/28/2015     CURRENT MEDICATIONS: No current outpatient medications on file. (Ophthalmic Drugs)   No current facility-administered medications for this visit. (Ophthalmic Drugs)   Current Outpatient Medications (Other)  Medication Sig  . aspirin 81 MG EC tablet Take 81 mg by mouth daily.    . carbidopa-levodopa (SINEMET IR) 25-100 MG tablet For breakthrough restless leg, you can take 1 tablet.  . Cholecalciferol 1000 UNITS tablet Take 3,000 Units by mouth daily.   . Cyanocobalamin (VITAMIN B-12) 1000 MCG SUBL DISSOLVE 2 TABLETS IN MOUTH EVERY DAY  . finasteride (PROSCAR) 5 MG tablet TAKE 1 TABLET BY MOUTH EVERY DAY  . furosemide (LASIX) 20 MG tablet Take 1-2 tablets (20-40 mg total) by mouth daily.  [name] gabapentin (NEURONTIN) 300 MG capsule TAKE 1 CAPSULE (300 MG TOTAL) BY MOUTH 4 (FOUR) TIMES DAILY.  [name] HYDROcodone-acetaminophen (NORCO) 7.5-325 MG tablet Take 1 tablet by mouth every 6 (six) hours as needed for severe pain. Please fill on or after 11/07/19  . HYDROcodone-acetaminophen (NORCO) 7.5-325 MG tablet Take 1 tablet by mouth every 6  (six) hours as needed for moderate pain.  [name] HYDROcodone-acetaminophen (NORCO) 7.5-325 MG tablet Take 1 tablet by mouth every 6 (six) hours as needed for moderate pain.  . meclizine (ANTIVERT) 12.5 MG tablet TAKE 1 TABLET (12.5 MG TOTAL) BY MOUTH 3 (THREE) TIMES DAILY AS NEEDED FOR DIZZINESS.  . Multiple Vitamin (MULTIVITAMIN) capsule Take 1 capsule by mouth daily.    . Multiple Vitamins-Minerals (PRESERVISION AREDS PO) Take 2 tablets by mouth 2 (two) times daily.  . pravastatin (PRAVACHOL) 20 MG tablet TAKE 1 TABLET BY MOUTH EVERY DAY  . rOPINIRole (REQUIP) 2 MG tablet TAKE 1 TABLET BY MOUTH AT 12PM, 1 TABLET AT 4PM AND 2 TABLETS AT BEDTIME  . tamsulosin (FLOMAX) 0.4 MG CAPS capsule TAKE 1 CAPSULE BY MOUTH EVERY DAY  . terazosin (HYTRIN) 2 MG capsule TAKE 1 CAPSULE BY MOUTH EVERYDAY AT BEDTIME  . triamcinolone cream (KENALOG) 0.1 % Apply 1 application topically 2 (two) times daily.   No current facility-administered medications for this visit. (Other)      REVIEW OF SYSTEMS:    ALLERGIES Allergies  Allergen Reactions  . Levofloxacin     REACTION: hands pealed    PAST MEDICAL HISTORY Past Medical History:  Diagnosis Date  . Alcoholism ([place])    Dry 13 years  . Basal cell carcinoma    Right Chest  .  Chronic insomnia    On ambien x 2 years  . Depression   . Diverticulosis of colon   . Dizzy spells   . Elbow fracture, left 2009   [name]  . Glaucoma   . History of TIAs   . Hyperkeratosis   . LBP (low back pain)   . Melanoma [facility]) 2009   x3 [name]  . Osteoarthritis   . Restless leg syndrome   . Tinnitus   . Tobacco abuse    Past Surgical History:  Procedure Laterality Date  . CATARACT EXTRACTION    . mda     Dr. [name] wet injection  . MELANOMA EXCISION  2009   x3  . RECONSTRUCTION MEDIAL COLLATERAL LIGAMENT ELBOW W/ TENDON GRAFT  2009   Left, [name]    FAMILY HISTORY Family History  Problem Relation Age of Onset  . Cancer Mother        ?breast    . Cancer Father        Lung    SOCIAL HISTORY Social History   Tobacco Use  . Smoking status: Former Smoker    Packs/day: 3.00    Years: 38.00    Pack years: 114.00    Types: Cigarettes    Quit date: 08/30/1990    Years since quitting: 29.2  . Smokeless tobacco: Never Used  . Tobacco comment: states he quit 1986  Substance Use Topics  . Alcohol use: No    Alcohol/week: 0.0 standard drinks    Comment: PREVIOUS - DRY 13 yrs  . Drug use: Not Currently         OPHTHALMIC EXAM: Base Eye Exam    Visual Acuity (Snellen - Linear)      Right Left   Dist [place] 20/20 20/80       Tonometry (Tonopen, 8:49 AM)      Right Left   Pressure 13 13       Pupils      Pupils Dark Light Shape React APD   Right PERRL 3 2 Round Minimal None   Left PERRL 3 2 Round Minimal None       Visual Fields (Counting fingers)      Left Right    Full Full       Neuro/Psych    Oriented x3: Yes   Mood/Affect: Normal       Dilation    Left eye: 1.0% Mydriacyl, 2.5% Phenylephrine @ 8:49 AM        Slit Lamp and Fundus Exam    External Exam      Right Left   External Normal Normal       Slit Lamp Exam      Right Left   Lids/Lashes Normal Normal   Conjunctiva/Sclera White and quiet White and quiet   Cornea Clear Clear   Anterior Chamber Deep and quiet Deep and quiet   Iris Round and reactive Round and reactive   Lens Posterior chamber intraocular lens Posterior chamber intraocular lens   Anterior Vitreous Normal Normal       Fundus Exam      Right Left   Posterior Vitreous  Posterior vitreous detachment   Disc  Normal   C/D Ratio  0.6   Macula  Retinal pigment epithelial detachment, Retinal pigment epithelial mottling, Drusen, Hard drusen, Atrophy, Age related macular degeneration, Early age related macular degeneration   Vessels  Normal   Periphery  Normal  IMAGING AND PROCEDURES  Imaging and Procedures for 11/26/19  OCT, Retina - OU - Both Eyes       Right  Eye Quality was good. Scan locations included subfoveal. Central Foveal Thickness: 278. Progression has been stable. Findings include retinal drusen .   Left Eye Quality was good. Scan locations included subfoveal. Central Foveal Thickness: 249. Progression has been stable. Findings include subretinal scarring, subretinal hyper-reflective material, pigment epithelial detachment.   Notes OS, with retinal pigment epithelial detachment with small area of subretinal fluid suggesting ongoing activity, at 6-week interval exam                ASSESSMENT/PLAN:  Early stage nonexudative age-related macular degeneration of left eye The nature of wet macular degeneration was discussed with the patient.  Forms of therapy reviewed include the use of Anti-VEGF medications injected painlessly into the eye, as well as other possible treatment modalities, including thermal laser therapy. Fellow eye involvement and risks were discussed with the patient. Upon the finding of wet age related macular degeneration, treatment will be offered. The treatment regimen is on a treat as needed basis with the intent to treat if necessary and extend interval of exams when possible. On average 1 out of 6 patients do not need lifetime therapy. However, the risk of recurrent disease is high for a lifetime.  Initially monthly, then periodic, examinations and evaluations will determine whether the next treatment is required on the day of the examination.  OS with chronic disease, complex lesion  with pigment epithelial detachment and subretinal fluid nasal to the FAZ      ICD-10-CM   1. Exudative age-related macular degeneration of left eye with active choroidal neovascularization (HCC)  H35.3221 OCT, Retina - OU - Both Eyes    Intravitreal Injection, Pharmacologic Agent - OS - Left Eye    CANCELED: OCT, Retina - OS - Left Eye  2. Early stage nonexudative age-related macular degeneration of left eye  H35.3121 OCT, Retina -  OU - Both Eyes    CANCELED: OCT, Retina - OS - Left Eye    1.  Complex lesion anatomy OS with wet ARMD.  Pigment epithelial detachment, with active subretinal fluid.  Currently at 6-week exam interval OD, will repeat intravitreal of Eylea OS today  2.  3.  Ophthalmic Meds Ordered this visit:  No orders of the defined types were placed in this encounter.      Return in about 6 weeks (around 01/07/2020) for dilate, OS, AVASTIN OCT.  There are no Patient Instructions on file for this visit.   Explained the diagnoses, plan, and follow up with the patient and they expressed understanding.  Patient expressed understanding of the importance of proper follow up care.   Clent Demark Damascus Feldpausch M.D. Diseases & Surgery of the Retina and Vitreous [facility] [place] 11/26/19     Abbreviations: M myopia (nearsighted); A astigmatism; H hyperopia (farsighted); P presbyopia; Mrx spectacle prescription;  CTL contact lenses; OD right eye; OS left eye; OU both eyes  XT exotropia; ET esotropia; PEK punctate epithelial keratitis; PEE punctate epithelial erosions; DES dry eye syndrome; MGD meibomian gland dysfunction; ATs artificial tears; PFAT's preservative free artificial tears; [place] nuclear sclerotic cataract; PSC posterior subcapsular cataract; ERM epi-retinal membrane; PVD posterior vitreous detachment; RD retinal detachment; DM diabetes mellitus; DR diabetic retinopathy; NPDR non-proliferative diabetic retinopathy; PDR proliferative diabetic retinopathy; CSME clinically significant macular edema; DME diabetic macular edema; dbh dot blot hemorrhages; CWS cotton wool spot; POAG primary  open angle glaucoma; C/D cup-to-disc ratio; HVF humphrey visual field; GVF goldmann visual field; OCT optical coherence tomography; IOP intraocular pressure; BRVO Branch retinal vein occlusion; CRVO central retinal vein occlusion; CRAO central retinal artery occlusion; BRAO branch retinal artery occlusion; RT retinal tear;  SB scleral buckle; PPV pars plana vitrectomy; VH Vitreous hemorrhage; PRP panretinal laser photocoagulation; IVK intravitreal kenalog; VMT vitreomacular traction; MH Macular hole;  NVD neovascularization of the disc; NVE neovascularization elsewhere; AREDS age related eye disease study; ARMD age related macular degeneration; POAG primary open angle glaucoma; EBMD epithelial/anterior basement membrane dystrophy; ACIOL anterior chamber intraocular lens; IOL intraocular lens; PCIOL posterior chamber intraocular lens; Phaco/IOL phacoemulsification with intraocular lens placement; [place] photorefractive keratectomy; LASIK laser assisted in situ keratomileusis; HTN hypertension; DM diabetes mellitus; COPD chronic obstructive pulmonary disease

## 2019-11-26 NOTE — Assessment & Plan Note (Signed)
The nature of wet macular degeneration was discussed with the patient.  Forms of therapy reviewed include the use of Anti-VEGF medications injected painlessly into the eye, as well as other possible treatment modalities, including thermal laser therapy. Fellow eye involvement and risks were discussed with the patient. Upon the finding of wet age related macular degeneration, treatment will be offered. The treatment regimen is on a treat as needed basis with the intent to treat if necessary and extend interval of exams when possible. On average 1 out of 6 patients do not need lifetime therapy. However, the risk of recurrent disease is high for a lifetime.  Initially monthly, then periodic, examinations and evaluations will determine whether the next treatment is required on the day of the examination.  OS with chronic disease, complex lesion  with pigment epithelial detachment and subretinal fluid nasal to the FAZ

## 2019-11-27 ENCOUNTER — Telehealth: Payer: Self-pay | Admitting: Internal Medicine

## 2019-11-27 ENCOUNTER — Other Ambulatory Visit: Payer: Self-pay | Admitting: Internal Medicine

## 2019-11-27 NOTE — Telephone Encounter (Signed)
New message:    Pt is calling back to check on the status of his refill. He again states he will be out of them tomorrow. Please advise.

## 2019-11-27 NOTE — Telephone Encounter (Signed)
Patient is requesting a refill on the following medication: HYDROcodone-acetaminophen ([place]) 7.5-325 MG tablet   He states he will be out tomorrow, asking if it can be filled today.  Pharmacy on file.

## 2019-11-28 ENCOUNTER — Other Ambulatory Visit: Payer: Self-pay | Admitting: Internal Medicine

## 2019-11-28 DIAGNOSIS — M545 Low back pain, unspecified: Secondary | ICD-10-CM

## 2019-11-28 DIAGNOSIS — G629 Polyneuropathy, unspecified: Secondary | ICD-10-CM

## 2019-11-28 DIAGNOSIS — G8929 Other chronic pain: Secondary | ICD-10-CM

## 2019-11-28 MED ORDER — HYDROCODONE-ACETAMINOPHEN 7.5-325 MG PO TABS: 1.0000 | ORAL_TABLET | Freq: Four times a day (QID) | ORAL | 0 refills | Status: DC | PRN

## 2019-11-28 NOTE — Telephone Encounter (Signed)
Renewed for 12/07/2019.  Thanks

## 2019-12-09 ENCOUNTER — Ambulatory Visit: Payer: PPO | Admitting: Internal Medicine

## 2019-12-10 ENCOUNTER — Encounter: Payer: Self-pay | Admitting: Internal Medicine

## 2019-12-10 ENCOUNTER — Ambulatory Visit (INDEPENDENT_AMBULATORY_CARE_PROVIDER_SITE_OTHER): Payer: PPO | Admitting: Internal Medicine

## 2019-12-10 ENCOUNTER — Other Ambulatory Visit: Payer: Self-pay

## 2019-12-10 DIAGNOSIS — G629 Polyneuropathy, unspecified: Secondary | ICD-10-CM | POA: Diagnosis not present

## 2019-12-10 DIAGNOSIS — N401 Enlarged prostate with lower urinary tract symptoms: Secondary | ICD-10-CM | POA: Diagnosis not present

## 2019-12-10 DIAGNOSIS — M545 Low back pain, unspecified: Secondary | ICD-10-CM

## 2019-12-10 DIAGNOSIS — E538 Deficiency of other specified B group vitamins: Secondary | ICD-10-CM

## 2019-12-10 DIAGNOSIS — G8929 Other chronic pain: Secondary | ICD-10-CM

## 2019-12-10 DIAGNOSIS — M79642 Pain in left hand: Secondary | ICD-10-CM

## 2019-12-10 DIAGNOSIS — I10 Essential (primary) hypertension: Secondary | ICD-10-CM | POA: Diagnosis not present

## 2019-12-10 DIAGNOSIS — G2581 Restless legs syndrome: Secondary | ICD-10-CM

## 2019-12-10 MED ORDER — HYDROCODONE-ACETAMINOPHEN 7.5-325 MG PO TABS: 1.0000 | ORAL_TABLET | Freq: Four times a day (QID) | ORAL | 0 refills | Status: DC | PRN

## 2019-12-10 MED ORDER — ASPIRIN 81 MG PO TBEC: 81.0000 mg | DELAYED_RELEASE_TABLET | Freq: Every day | ORAL | 3 refills | Status: DC

## 2019-12-10 NOTE — Assessment & Plan Note (Signed)
Check BP at home - call if elevated

## 2019-12-10 NOTE — Progress Notes (Signed)
Subjective:  Patient ID: Justin [name]., male    DOB: Aug 21, 1940  Age: 79 y.o. MRN: [id number]  CC: No chief complaint on file.   HPI [name] [employer]. presents for chronic pain - he asked me to print his Rx - problems. F/u BPH, RLS  Outpatient Medications Prior to Visit  Medication Sig Dispense Refill  . carbidopa-levodopa (SINEMET IR) 25-100 MG tablet For breakthrough restless leg, you can take 1 tablet. 30 tablet 5  . Cholecalciferol 1000 UNITS tablet Take 3,000 Units by mouth daily.     . Cyanocobalamin (VITAMIN B-12) 1000 MCG SUBL DISSOLVE 2 TABLETS IN MOUTH EVERY DAY 60 tablet 8  . finasteride (PROSCAR) 5 MG tablet TAKE 1 TABLET BY MOUTH EVERY DAY 90 tablet 1  . furosemide (LASIX) 20 MG tablet Take 1-2 tablets (20-40 mg total) by mouth daily. 60 tablet 3  . gabapentin (NEURONTIN) 300 MG capsule TAKE 1 CAPSULE (300 MG TOTAL) BY MOUTH 4 (FOUR) TIMES DAILY. 360 capsule 1  . meclizine (ANTIVERT) 12.5 MG tablet TAKE 1 TABLET (12.5 MG TOTAL) BY MOUTH 3 (THREE) TIMES DAILY AS NEEDED FOR DIZZINESS. 60 tablet 1  . Multiple Vitamin (MULTIVITAMIN) capsule Take 1 capsule by mouth daily.      . Multiple Vitamins-Minerals (PRESERVISION AREDS PO) Take 2 tablets by mouth 2 (two) times daily.    . pravastatin (PRAVACHOL) 20 MG tablet TAKE 1 TABLET BY MOUTH EVERY DAY 90 tablet 1  . rOPINIRole (REQUIP) 2 MG tablet TAKE 1 TABLET BY MOUTH AT 12PM, 1 TABLET AT 4PM AND 2 TABLETS AT BEDTIME 360 tablet 1  . tamsulosin (FLOMAX) 0.4 MG CAPS capsule TAKE 1 CAPSULE BY MOUTH EVERY DAY 90 capsule 3  . terazosin (HYTRIN) 2 MG capsule TAKE 1 CAPSULE BY MOUTH EVERYDAY AT BEDTIME 90 capsule 3  . triamcinolone cream (KENALOG) 0.1 % Apply 1 application topically 2 (two) times daily. 100 g 0  . aspirin 81 MG EC tablet Take 81 mg by mouth daily.      [name] HYDROcodone-acetaminophen (NORCO) 7.5-325 MG tablet Take 1 tablet by mouth every 6 (six) hours as needed for moderate pain. 120 tablet 0  .  HYDROcodone-acetaminophen (NORCO) 7.5-325 MG tablet Take 1 tablet by mouth every 6 (six) hours as needed for severe pain. Please fill on or after 11/07/19 60 tablet 0  . HYDROcodone-acetaminophen (NORCO) 7.5-325 MG tablet Take 1 tablet by mouth every 6 (six) hours as needed for moderate pain. 120 tablet 0   No facility-administered medications prior to visit.    ROS: Review of Systems  Constitutional: Negative for appetite change, fatigue and unexpected weight change.  HENT: Negative for congestion, nosebleeds, sneezing, sore throat and trouble swallowing.   Eyes: Negative for itching and visual disturbance.  Respiratory: Negative for cough.   Cardiovascular: Negative for chest pain, palpitations and leg swelling.  Gastrointestinal: Negative for abdominal distention, blood in stool, diarrhea and nausea.  Genitourinary: Negative for frequency and hematuria.  Musculoskeletal: Positive for arthralgias. Negative for back pain, gait problem, joint swelling and neck pain.  Skin: Negative for rash.  Neurological: Negative for dizziness, tremors, speech difficulty and weakness.  Psychiatric/Behavioral: Negative for agitation, dysphoric mood, sleep disturbance and suicidal ideas. The patient is not nervous/anxious.     Objective:  BP (!) 162/84   Pulse 84   Temp 98.1 F (36.7 C) (Oral)   Ht 6' (1.829 m)   Wt 204 lb 6 oz (92.7 kg)   SpO2 94%   BMI  27.72 kg/m   BP Readings from Last 3 Encounters:  12/10/19 (!) 162/84  10/27/19 (!) 154/72  10/16/19 (!) 162/86    Wt Readings from Last 3 Encounters:  12/10/19 204 lb 6 oz (92.7 kg)  10/27/19 206 lb (93.4 kg)  10/16/19 212 lb (96.2 kg)    Physical Exam Constitutional:      General: He is not in acute distress.    Appearance: He is well-developed.     Comments: NAD  Eyes:     Conjunctiva/sclera: Conjunctivae normal.     Pupils: Pupils are equal, round, and reactive to light.  Neck:     Thyroid: No thyromegaly.     Vascular: No  JVD.  Cardiovascular:     Rate and Rhythm: Normal rate and regular rhythm.     Heart sounds: Normal heart sounds. No murmur. No friction rub. No gallop.   Pulmonary:     Effort: Pulmonary effort is normal. No respiratory distress.     Breath sounds: Normal breath sounds. No wheezing or rales.  Chest:     Chest wall: No tenderness.  Abdominal:     General: Bowel sounds are normal. There is no distension.     Palpations: Abdomen is soft. There is no mass.     Tenderness: There is no abdominal tenderness. There is no guarding or rebound.  Musculoskeletal:        General: Tenderness present. Normal range of motion.     Cervical back: Normal range of motion.  Lymphadenopathy:     Cervical: No cervical adenopathy.  Skin:    General: Skin is warm and dry.     Findings: No rash.  Neurological:     Mental Status: He is alert and oriented to person, place, and time.     Cranial Nerves: No cranial nerve deficit.     Motor: No abnormal muscle tone.     Coordination: Coordination normal.     Gait: Gait normal.     Deep Tendon Reflexes: Reflexes are normal and symmetric.  Psychiatric:        Behavior: Behavior normal.        Thought Content: Thought content normal.        Judgment: Judgment normal.   L arm w/pain  Lab Results  Component Value Date   WBC 5.1 10/16/2019   HGB 10.3 (L) 10/16/2019   HCT 30.0 (L) 10/16/2019   PLT 160.0 10/16/2019   GLUCOSE 94 10/27/2019   CHOL 136 01/12/2017   TRIG 67.0 01/12/2017   HDL 54.70 01/12/2017   LDLCALC 68 01/12/2017   ALT 13 10/16/2019   AST 21 10/16/2019   NA 139 10/27/2019   K 4.4 10/27/2019   CL 105 10/27/2019   CREATININE 1.21 10/27/2019   BUN 24 (H) 10/27/2019   CO2 28 10/27/2019   TSH 3.05 10/16/2019   PSA 0.47 08/30/2016   HGBA1C 5.6 07/28/2015    VAS [place] LOWER EXTREMITY VENOUS (DVT)  Result Date: 10/29/2019  Lower Venous DVTStudy Indications: Edema.  Limitations: Poor ultrasound/tissue interface. Comparison Study: 01/12/2017  negative left lower extremity venous duplex Performing Technologist: Maudry Mayhew MHA, RDMS, RVT, RDCS  Examination Guidelines: A complete evaluation includes B-mode imaging, spectral Doppler, color Doppler, and power Doppler as needed of all accessible portions of each vessel. Bilateral testing is considered an integral part of a complete examination. Limited examinations for reoccurring indications may be performed as noted. The reflux portion of the exam is performed with the patient in reverse Trendelenburg.  +---------+---------------+---------+-----------+----------+--------------+  RIGHT    CompressibilityPhasicitySpontaneityPropertiesThrombus Aging +---------+---------------+---------+-----------+----------+--------------+ CFV      Full           Yes      Yes                                 +---------+---------------+---------+-----------+----------+--------------+ SFJ      Full                                                        +---------+---------------+---------+-----------+----------+--------------+ FV Prox  Full                                                        +---------+---------------+---------+-----------+----------+--------------+ FV Mid   Full                                                        +---------+---------------+---------+-----------+----------+--------------+ FV DistalFull                                                        +---------+---------------+---------+-----------+----------+--------------+ PFV      Full                                                        +---------+---------------+---------+-----------+----------+--------------+ POP      Full           Yes      Yes                                 +---------+---------------+---------+-----------+----------+--------------+ PTV      Full                                                         +---------+---------------+---------+-----------+----------+--------------+ PERO     Full                                                        +---------+---------------+---------+-----------+----------+--------------+   +---------+---------------+---------+-----------+----------+--------------+ LEFT     CompressibilityPhasicitySpontaneityPropertiesThrombus Aging +---------+---------------+---------+-----------+----------+--------------+ CFV      Full           Yes      Yes                                 +---------+---------------+---------+-----------+----------+--------------+  SFJ      Full                                                        +---------+---------------+---------+-----------+----------+--------------+ FV Prox  Full                                                        +---------+---------------+---------+-----------+----------+--------------+ FV Mid   Full                                                        +---------+---------------+---------+-----------+----------+--------------+ FV DistalFull                                                        +---------+---------------+---------+-----------+----------+--------------+ PFV      Full                                                        +---------+---------------+---------+-----------+----------+--------------+ POP      Full           Yes      Yes                                 +---------+---------------+---------+-----------+----------+--------------+ PTV      Full                                                        +---------+---------------+---------+-----------+----------+--------------+ PERO     Full                                                        +---------+---------------+---------+-----------+----------+--------------+     Summary: RIGHT: - There is no evidence of deep vein thrombosis in the lower extremity.  - No cystic structure found in  the popliteal fossa.  LEFT: - There is no evidence of deep vein thrombosis in the lower extremity.  - No cystic structure found in the popliteal fossa.  *See table(s) above for measurements and observations. Electronically signed by Servando Snare MD on 10/29/2019 at 8:20:47 PM.    Final     Assessment & Plan:   Diagnoses and all orders for this visit:  Chronic right-sided low back pain without sciatica -     Discontinue: HYDROcodone-acetaminophen (NORCO) 7.5-325 MG tablet;  Take 1 tablet by mouth every 6 (six) hours as needed for severe pain. Please fill on or after 12/11/19 -     HYDROcodone-acetaminophen (NORCO) 7.5-325 MG tablet; Take 1 tablet by mouth every 6 (six) hours as needed for severe pain. Please fill on or after 12/11/19  Neuropathy -     Discontinue: HYDROcodone-acetaminophen (NORCO) 7.5-325 MG tablet; Take 1 tablet by mouth every 6 (six) hours as needed for severe pain. Please fill on or after 12/11/19 -     HYDROcodone-acetaminophen (NORCO) 7.5-325 MG tablet; Take 1 tablet by mouth every 6 (six) hours as needed for severe pain. Please fill on or after 12/11/19  Other orders -     aspirin 81 MG EC tablet; Take 1 tablet (81 mg total) by mouth daily. -     HYDROcodone-acetaminophen (NORCO) 7.5-325 MG tablet; Take 1 tablet by mouth every 6 (six) hours as needed for severe pain. -     HYDROcodone-acetaminophen (NORCO) 7.5-325 MG tablet; Take 1 tablet by mouth every 6 (six) hours as needed for severe pain.     Meds ordered this encounter  Medications  . DISCONTD: HYDROcodone-acetaminophen (NORCO) 7.5-325 MG tablet    Sig: Take 1 tablet by mouth every 6 (six) hours as needed for severe pain. Please fill on or after 12/11/19    Dispense:  60 tablet    Refill:  0  . aspirin 81 MG EC tablet    Sig: Take 1 tablet (81 mg total) by mouth daily.    Dispense:  100 tablet    Refill:  3  . HYDROcodone-acetaminophen (NORCO) 7.5-325 MG tablet    Sig: Take 1 tablet by mouth every 6 (six) hours  as needed for severe pain.    Dispense:  120 tablet    Refill:  0    Please fill on or after 12/09/19  . HYDROcodone-acetaminophen (NORCO) 7.5-325 MG tablet    Sig: Take 1 tablet by mouth every 6 (six) hours as needed for severe pain.    Dispense:  120 tablet    Refill:  0    Please fill on or after 01/07/20  . HYDROcodone-acetaminophen (NORCO) 7.5-325 MG tablet    Sig: Take 1 tablet by mouth every 6 (six) hours as needed for severe pain. Please fill on or after 12/11/19    Dispense:  60 tablet    Refill:  0     Follow-up: No follow-ups on file.  [name], MD

## 2019-12-10 NOTE — Assessment & Plan Note (Signed)
Rx hard copies Norco  Potential benefits of a long term opioids use as well as potential risks (i.e. addiction risk, apnea etc) and complications (i.e. Somnolence, constipation and others) were explained to the patient and were aknowledged.

## 2019-12-10 NOTE — Assessment & Plan Note (Signed)
Cont w/B12 

## 2019-12-10 NOTE — Assessment & Plan Note (Signed)
On meds - no change

## 2019-12-10 NOTE — Assessment & Plan Note (Addendum)
Gabapentin, Requip, Sinemet

## 2020-01-07 ENCOUNTER — Encounter (INDEPENDENT_AMBULATORY_CARE_PROVIDER_SITE_OTHER): Payer: Self-pay | Admitting: Ophthalmology

## 2020-01-07 ENCOUNTER — Other Ambulatory Visit: Payer: Self-pay

## 2020-01-07 ENCOUNTER — Ambulatory Visit (INDEPENDENT_AMBULATORY_CARE_PROVIDER_SITE_OTHER): Payer: PPO | Admitting: Ophthalmology

## 2020-01-07 DIAGNOSIS — H353221 Exudative age-related macular degeneration, left eye, with active choroidal neovascularization: Secondary | ICD-10-CM | POA: Diagnosis not present

## 2020-01-07 MED ORDER — AFLIBERCEPT 2MG/0.05ML IZ SOLN FOR KALEIDOSCOPE
2.0000 mg | INTRAVITREAL | Status: AC | PRN
  Administered 2020-01-07: 2 mg via INTRAVITREAL

## 2020-01-07 NOTE — Assessment & Plan Note (Signed)
Anatomic improvement left eye continues with much less subretinal fluid.  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

## 2020-01-07 NOTE — Progress Notes (Signed)
01/07/2020     CHIEF COMPLAINT Patient presents for Retina Follow Up   HISTORY OF PRESENT ILLNESS: Justin [name]. is a 79 y.o. male who presents to the clinic today for:   HPI    Retina Follow Up    Patient presents with  Wet AMD.  In left eye.  Duration of 6 weeks.  Since onset it is stable.          Comments    6 week follow up - OCT OU, Poss Eylea OS Patient denies change in vision and overall has no complaints.        Last edited by Gerda Diss on 01/07/2020  8:08 AM. (History)      Referring physician: [name], MD [place],  [place] [address]  HISTORICAL INFORMATION:   Selected notes from the MEDICAL RECORD NUMBER    Lab Results  Component Value Date   HGBA1C 5.6 07/28/2015     CURRENT MEDICATIONS: No current outpatient medications on file. (Ophthalmic Drugs)   No current facility-administered medications for this visit. (Ophthalmic Drugs)   Current Outpatient Medications (Other)  Medication Sig  . aspirin 81 MG EC tablet Take 1 tablet (81 mg total) by mouth daily.  . carbidopa-levodopa (SINEMET IR) 25-100 MG tablet For breakthrough restless leg, you can take 1 tablet.  . Cholecalciferol 1000 UNITS tablet Take 3,000 Units by mouth daily.   . Cyanocobalamin (VITAMIN B-12) 1000 MCG SUBL DISSOLVE 2 TABLETS IN MOUTH EVERY DAY  . finasteride (PROSCAR) 5 MG tablet TAKE 1 TABLET BY MOUTH EVERY DAY  . furosemide (LASIX) 20 MG tablet Take 1-2 tablets (20-40 mg total) by mouth daily.  [name] gabapentin (NEURONTIN) 300 MG capsule TAKE 1 CAPSULE (300 MG TOTAL) BY MOUTH 4 (FOUR) TIMES DAILY.  [name] HYDROcodone-acetaminophen (NORCO) 7.5-325 MG tablet Take 1 tablet by mouth every 6 (six) hours as needed for severe pain.  [name] HYDROcodone-acetaminophen (NORCO) 7.5-325 MG tablet Take 1 tablet by mouth every 6 (six) hours as needed for severe pain.  [name] HYDROcodone-acetaminophen (NORCO) 7.5-325 MG tablet Take 1 tablet by mouth every 6 (six) hours as  needed for severe pain. Please fill on or after 12/11/19  . meclizine (ANTIVERT) 12.5 MG tablet TAKE 1 TABLET (12.5 MG TOTAL) BY MOUTH 3 (THREE) TIMES DAILY AS NEEDED FOR DIZZINESS.  . Multiple Vitamin (MULTIVITAMIN) capsule Take 1 capsule by mouth daily.    . Multiple Vitamins-Minerals (PRESERVISION AREDS PO) Take 2 tablets by mouth 2 (two) times daily.  . pravastatin (PRAVACHOL) 20 MG tablet TAKE 1 TABLET BY MOUTH EVERY DAY  . rOPINIRole (REQUIP) 2 MG tablet TAKE 1 TABLET BY MOUTH AT 12PM, 1 TABLET AT 4PM AND 2 TABLETS AT BEDTIME  . tamsulosin (FLOMAX) 0.4 MG CAPS capsule TAKE 1 CAPSULE BY MOUTH EVERY DAY  . terazosin (HYTRIN) 2 MG capsule TAKE 1 CAPSULE BY MOUTH EVERYDAY AT BEDTIME  . triamcinolone cream (KENALOG) 0.1 % Apply 1 application topically 2 (two) times daily.   No current facility-administered medications for this visit. (Other)      REVIEW OF SYSTEMS:    ALLERGIES Allergies  Allergen Reactions  . Levofloxacin     REACTION: hands pealed    PAST MEDICAL HISTORY Past Medical History:  Diagnosis Date  . Alcoholism ([place])    Dry 13 years  . Basal cell carcinoma    Right Chest  . Chronic insomnia    On ambien x 2 years  . Depression   .  Diverticulosis of colon   . Dizzy spells   . Elbow fracture, left 2009   [name]  . Glaucoma   . History of TIAs   . Hyperkeratosis   . LBP (low back pain)   . Melanoma [facility]) 2009   x3 [name]  . Osteoarthritis   . Restless leg syndrome   . Tinnitus   . Tobacco abuse    Past Surgical History:  Procedure Laterality Date  . CATARACT EXTRACTION    . mda     Dr. [name] wet injection  . MELANOMA EXCISION  2009   x3  . RECONSTRUCTION MEDIAL COLLATERAL LIGAMENT ELBOW W/ TENDON GRAFT  2009   Left, [name]    FAMILY HISTORY Family History  Problem Relation Age of Onset  . Cancer Mother        ?breast  . Cancer Father        Lung    SOCIAL HISTORY Social History   Tobacco Use  . Smoking status: Former  Smoker    Packs/day: 3.00    Years: 38.00    Pack years: 114.00    Types: Cigarettes    Quit date: 08/30/1990    Years since quitting: 29.3  . Smokeless tobacco: Never Used  . Tobacco comment: states he quit 1986  Vaping Use  . Vaping Use: Never used  Substance Use Topics  . Alcohol use: No    Alcohol/week: 0.0 standard drinks    Comment: PREVIOUS - DRY 13 yrs  . Drug use: Not Currently         OPHTHALMIC EXAM:  Base Eye Exam    Visual Acuity (Snellen - Linear)      Right Left   Dist [place] 20/20-2 20/70+2   Dist ph [place]  NI       Tonometry (Tonopen, 8:13 AM)      Right Left   Pressure 11 14       Pupils      Pupils Dark Light Shape React APD   Right PERRL 3 2 Round Minimal None   Left PERRL 3 2 Round Minimal None       Visual Fields (Counting fingers)      Left Right    Full Full       Extraocular Movement      Right Left    Full Full       Neuro/Psych    Oriented x3: Yes   Mood/Affect: Normal       Dilation    Left eye: 1.0% Mydriacyl, 2.5% Phenylephrine @ 8:13 AM        Slit Lamp and Fundus Exam    External Exam      Right Left   External Normal Normal       Slit Lamp Exam      Right Left   Lids/Lashes Normal Normal   Conjunctiva/Sclera White and quiet White and quiet   Cornea Clear Clear   Anterior Chamber Deep and quiet Deep and quiet   Iris Round and reactive Round and reactive   Lens Posterior chamber intraocular lens Posterior chamber intraocular lens   Anterior Vitreous Normal Normal       Fundus Exam      Right Left   Posterior Vitreous  Posterior vitreous detachment   Disc  Normal   C/D Ratio  0.6   Macula  Retinal pigment epithelial detachment, Retinal pigment epithelial mottling, Drusen, Hard drusen, Atrophy, , Early age related macular degeneration  Vessels  Normal   Periphery  Normal          IMAGING AND PROCEDURES  Imaging and Procedures for 01/07/20  OCT, Retina - OU - Both Eyes       Right Eye Quality was  good. Scan locations included subfoveal. Central Foveal Thickness: 282. Progression has been stable. Findings include no SRF, retinal drusen , no IRF.   Left Eye Quality was good. Scan locations included subfoveal. Central Foveal Thickness: 262. Progression has improved. Findings include no SRF, pigment epithelial detachment, no IRF.   Notes Subretinal fluid overlying fibrotic PED nasal and subfoveal OS now involutional.  Will repeat intravitreal Eylea OS today at 6-week interval and extend visit next to 7 weeks       Intravitreal Injection, Pharmacologic Agent - OS - Left Eye       Time Out 01/07/2020. 9:08 AM. Confirmed correct patient, procedure, site, and patient consented.   Anesthesia Topical anesthesia was used. Anesthetic medications included Akten 3.5%.   Procedure Preparation included Tobramycin 0.3%, 10% betadine to eyelids. A 30 gauge needle was used.   Injection:  2 mg aflibercept Alfonse Flavors) SOLN   NDC: [id number], Lot: [id number]   Route: Intravitreal, Site: Left Eye, Waste: 0 mg  Post-op Post injection exam found visual acuity of at least counting fingers. The patient tolerated the procedure well. There were no complications. The patient received written and verbal post procedure care education. Post injection medications were not given.                 ASSESSMENT/PLAN:  Exudative age-related macular degeneration of left eye with active choroidal neovascularization (HCC) Anatomic improvement left eye continues with much less subretinal fluid.  Vision limited by fibrotic subfoveal pigment epithelial detachment, repea tEylea injection OS today and examination in 7 weeks      ICD-10-CM   1. Exudative age-related macular degeneration of left eye with active choroidal neovascularization (HCC)  H35.3221 OCT, Retina - OU - Both Eyes    Intravitreal Injection, Pharmacologic Agent - OS - Left Eye    aflibercept (EYLEA) SOLN 2 mg    1.  Repeat intravitreal Eylea  OS today at 6-week interval, repeat examination OS in 7 weeks  2.  3.  Ophthalmic Meds Ordered this visit:  Meds ordered this encounter  Medications  . aflibercept (EYLEA) SOLN 2 mg       Return in about 7 weeks (around 02/25/2020) for dilate, OS, EYLEA OCT.  There are no Patient Instructions on file for this visit.   Explained the diagnoses, plan, and follow up with the patient and they expressed understanding.  Patient expressed understanding of the importance of proper follow up care.   Clent Demark Earlee Herald M.D. Diseases & Surgery of the Retina and Vitreous [facility]ina [facility] [place] 01/07/20     Abbreviations: M myopia (nearsighted); A astigmatism; H hyperopia (farsighted); P presbyopia; Mrx spectacle prescription;  CTL contact lenses; OD right eye; OS left eye; OU both eyes  XT exotropia; ET esotropia; PEK punctate epithelial keratitis; PEE punctate epithelial erosions; DES dry eye syndrome; MGD meibomian gland dysfunction; ATs artificial tears; PFAT's preservative free artificial tears; [place] nuclear sclerotic cataract; PSC posterior subcapsular cataract; ERM epi-retinal membrane; PVD posterior vitreous detachment; RD retinal detachment; DM diabetes mellitus; DR diabetic retinopathy; NPDR non-proliferative diabetic retinopathy; PDR proliferative diabetic retinopathy; CSME clinically significant macular edema; DME diabetic macular edema; dbh dot blot hemorrhages; CWS cotton wool spot; POAG primary open angle glaucoma; C/D cup-to-disc ratio; HVF  humphrey visual field; GVF goldmann visual field; OCT optical coherence tomography; IOP intraocular pressure; BRVO Branch retinal vein occlusion; CRVO central retinal vein occlusion; CRAO central retinal artery occlusion; BRAO branch retinal artery occlusion; RT retinal tear; SB scleral buckle; PPV pars plana vitrectomy; VH Vitreous hemorrhage; PRP panretinal laser photocoagulation; IVK intravitreal kenalog; VMT vitreomacular traction; MH Macular  hole;  NVD neovascularization of the disc; NVE neovascularization elsewhere; AREDS age related eye disease study; ARMD age related macular degeneration; POAG primary open angle glaucoma; EBMD epithelial/anterior basement membrane dystrophy; ACIOL anterior chamber intraocular lens; IOL intraocular lens; PCIOL posterior chamber intraocular lens; Phaco/IOL phacoemulsification with intraocular lens placement; [place] photorefractive keratectomy; LASIK laser assisted in situ keratomileusis; HTN hypertension; DM diabetes mellitus; COPD chronic obstructive pulmonary disease

## 2020-01-30 DIAGNOSIS — H16142 Punctate keratitis, left eye: Secondary | ICD-10-CM | POA: Diagnosis not present

## 2020-02-02 DIAGNOSIS — H402222 Chronic angle-closure glaucoma, left eye, moderate stage: Secondary | ICD-10-CM | POA: Diagnosis not present

## 2020-02-02 DIAGNOSIS — H402211 Chronic angle-closure glaucoma, right eye, mild stage: Secondary | ICD-10-CM | POA: Diagnosis not present

## 2020-02-02 DIAGNOSIS — H16142 Punctate keratitis, left eye: Secondary | ICD-10-CM | POA: Diagnosis not present

## 2020-02-10 ENCOUNTER — Ambulatory Visit: Payer: PPO | Admitting: Podiatry

## 2020-02-10 ENCOUNTER — Other Ambulatory Visit: Payer: Self-pay

## 2020-02-10 DIAGNOSIS — M79674 Pain in right toe(s): Secondary | ICD-10-CM | POA: Diagnosis not present

## 2020-02-10 DIAGNOSIS — Q828 Other specified congenital malformations of skin: Secondary | ICD-10-CM | POA: Diagnosis not present

## 2020-02-10 DIAGNOSIS — M79675 Pain in left toe(s): Secondary | ICD-10-CM | POA: Diagnosis not present

## 2020-02-10 DIAGNOSIS — G629 Polyneuropathy, unspecified: Secondary | ICD-10-CM | POA: Diagnosis not present

## 2020-02-10 DIAGNOSIS — B351 Tinea unguium: Secondary | ICD-10-CM | POA: Diagnosis not present

## 2020-02-16 NOTE — Progress Notes (Signed)
Subjective: 79 y.o. returns the office today for painful, elongated, thickened toenails which he cannot trim himself.  He also has a painful corn second toe on the left foot which make it difficult to wear shoes. No open lesions or drainage. Denies any redness, drainage or signs of infection. Denies any systemic complaints such as fevers, chills, nausea, vomiting.   PCP: Plotnikov, Evie Lacks, MD  Objective: AAO 3, NAD DP/PT pulses palpable, CRT less than 3 seconds Neurological status is unchanged.  Nails hypertrophic, dystrophic, elongated, brittle, discolored 10. There is tenderness overlying the nails 1-5 bilaterally. There is no surrounding erythema or drainage along the nail sites. Hyperkeratotic lesion sharply debrided x 1 without any complications or bleeding.  There is deformity present the left third toe and the toe is rigid contracture at the level of the DIPJ.   No open sores, swelling. No other areas of tenderness bilateral lower extremities. No overlying edema, erythema, increased warmth. Hammertoes present.  No pain with calf compression, swelling, warmth, erythema.  Assessment: Patient presents with symptomatic onychomycosis; hyperkeratotic lesion; left third toe deformity  Plan: -Treatment options including alternatives, risks, complications were discussed -Nails sharply debrided 10 without complication/bleeding. -Hyperkeratotic lesion sharply debrided x 1 without any complications or bleeding. Continue offloading to the toes.  -Discussed daily foot inspection. If there are any changes, to call the office immediately.  -Follow-up in 9 weeks or sooner if any problems are to arise. In the meantime, encouraged to call the office with any questions, concerns, changes symptoms.  [name], DPM

## 2020-02-18 DIAGNOSIS — Z85828 Personal history of other malignant neoplasm of skin: Secondary | ICD-10-CM | POA: Diagnosis not present

## 2020-02-18 DIAGNOSIS — D1801 Hemangioma of skin and subcutaneous tissue: Secondary | ICD-10-CM | POA: Diagnosis not present

## 2020-02-18 DIAGNOSIS — D225 Melanocytic nevi of trunk: Secondary | ICD-10-CM | POA: Diagnosis not present

## 2020-02-18 DIAGNOSIS — L570 Actinic keratosis: Secondary | ICD-10-CM | POA: Diagnosis not present

## 2020-02-18 DIAGNOSIS — D2271 Melanocytic nevi of right lower limb, including hip: Secondary | ICD-10-CM | POA: Diagnosis not present

## 2020-02-18 DIAGNOSIS — L821 Other seborrheic keratosis: Secondary | ICD-10-CM | POA: Diagnosis not present

## 2020-02-18 DIAGNOSIS — D2371 Other benign neoplasm of skin of right lower limb, including hip: Secondary | ICD-10-CM | POA: Diagnosis not present

## 2020-02-19 ENCOUNTER — Other Ambulatory Visit: Payer: Self-pay | Admitting: Internal Medicine

## 2020-02-25 ENCOUNTER — Other Ambulatory Visit: Payer: Self-pay

## 2020-02-25 ENCOUNTER — Encounter (INDEPENDENT_AMBULATORY_CARE_PROVIDER_SITE_OTHER): Payer: Self-pay | Admitting: Ophthalmology

## 2020-02-25 ENCOUNTER — Ambulatory Visit (INDEPENDENT_AMBULATORY_CARE_PROVIDER_SITE_OTHER): Payer: PPO | Admitting: Ophthalmology

## 2020-02-25 DIAGNOSIS — H353221 Exudative age-related macular degeneration, left eye, with active choroidal neovascularization: Secondary | ICD-10-CM | POA: Diagnosis not present

## 2020-02-25 MED ORDER — AFLIBERCEPT 2MG/0.05ML IZ SOLN FOR KALEIDOSCOPE
2.0000 mg | INTRAVITREAL | Status: AC | PRN
  Administered 2020-02-25: 2 mg via INTRAVITREAL

## 2020-02-25 NOTE — Assessment & Plan Note (Signed)
The nature of wet macular degeneration was discussed with the patient.  Forms of therapy reviewed include the use of Anti-VEGF medications injected painlessly into the eye, as well as other possible treatment modalities, including thermal laser therapy. Fellow eye involvement and risks were discussed with the patient. Upon the finding of wet age related macular degeneration, treatment will be offered. The treatment regimen is on a treat as needed basis with the intent to treat if necessary and extend interval of exams when possible. On average 1 out of 6 patients do not need lifetime therapy. However, the risk of recurrent disease is high for a lifetime.  Initially monthly, then periodic, examinations and evaluations will determine whether the next treatment is required on the day of the examination. OS, much less subretinal fluid overlying pigment epithelial detachment.  Currently on 7-week interval this condition in the left eye has been resistant to Avastin in the past.  We will repeat intravitreal Eylea OS today and examination in 8 weeks

## 2020-02-25 NOTE — Progress Notes (Signed)
02/25/2020     CHIEF COMPLAINT Patient presents for Retina Follow Up   HISTORY OF PRESENT ILLNESS: Justin [name]. is a 79 y.o. male who presents to the clinic today for:   HPI    Retina Follow Up    Patient presents with  Wet AMD.  In left eye.  This started 7 weeks ago.  Severity is mild.  Duration of 7 weeks.  Since onset it is stable.          Comments    7 Week AMD F/U OS, poss Eylea OS  Pt denies noticeable changes to [place] OU since last visit. Pt denies ocular pain, flashes of light, or floaters OU.         Last edited by Rockie Neighbours, [place] on 02/25/2020  8:04 AM. (History)      Referring physician: [name], MD [place],  [place] [id number]  HISTORICAL INFORMATION:   Selected notes from the MEDICAL RECORD NUMBER    Lab Results  Component Value Date   HGBA1C 5.6 07/28/2015     CURRENT MEDICATIONS: No current outpatient medications on file. (Ophthalmic Drugs)   No current facility-administered medications for this visit. (Ophthalmic Drugs)   Current Outpatient Medications (Other)  Medication Sig  . aspirin 81 MG EC tablet Take 1 tablet (81 mg total) by mouth daily.  . carbidopa-levodopa (SINEMET IR) 25-100 MG tablet For breakthrough restless leg, you can take 1 tablet.  . Cholecalciferol 1000 UNITS tablet Take 3,000 Units by mouth daily.   . Cyanocobalamin (VITAMIN B-12) 1000 MCG SUBL DISSOLVE 2 TABLETS IN MOUTH EVERY DAY  . finasteride (PROSCAR) 5 MG tablet TAKE 1 TABLET BY MOUTH EVERY DAY  . furosemide (LASIX) 20 MG tablet Take 1-2 tablets (20-40 mg total) by mouth daily.  [name] gabapentin (NEURONTIN) 300 MG capsule TAKE 1 CAPSULE (300 MG TOTAL) BY MOUTH 4 (FOUR) TIMES DAILY.  [name] HYDROcodone-acetaminophen (NORCO) 7.5-325 MG tablet Take 1 tablet by mouth every 6 (six) hours as needed for severe pain.  [name] HYDROcodone-acetaminophen (NORCO) 7.5-325 MG tablet Take 1 tablet by mouth every 6 (six) hours as needed for severe pain.  [name]  HYDROcodone-acetaminophen (NORCO) 7.5-325 MG tablet Take 1 tablet by mouth every 6 (six) hours as needed for severe pain. Please fill on or after 12/11/19  . meclizine (ANTIVERT) 12.5 MG tablet TAKE 1 TABLET (12.5 MG TOTAL) BY MOUTH 3 (THREE) TIMES DAILY AS NEEDED FOR DIZZINESS.  . Multiple Vitamin (MULTIVITAMIN) capsule Take 1 capsule by mouth daily.    . Multiple Vitamins-Minerals (PRESERVISION AREDS PO) Take 2 tablets by mouth 2 (two) times daily.  . pravastatin (PRAVACHOL) 20 MG tablet TAKE 1 TABLET BY MOUTH EVERY DAY  . rOPINIRole (REQUIP) 2 MG tablet TAKE 1 TABLET BY MOUTH AT 12PM, 1 TABLET AT 4PM AND 2 TABLETS AT BEDTIME  . tamsulosin (FLOMAX) 0.4 MG CAPS capsule TAKE 1 CAPSULE BY MOUTH EVERY DAY  . terazosin (HYTRIN) 2 MG capsule TAKE 1 CAPSULE BY MOUTH EVERYDAY AT BEDTIME  . triamcinolone cream (KENALOG) 0.1 % Apply 1 application topically 2 (two) times daily.   No current facility-administered medications for this visit. (Other)      REVIEW OF SYSTEMS:    ALLERGIES Allergies  Allergen Reactions  . Levofloxacin     REACTION: hands pealed    PAST MEDICAL HISTORY Past Medical History:  Diagnosis Date  . Alcoholism ([place])    Dry 13 years  . Basal cell carcinoma  Right Chest  . Chronic insomnia    On ambien x 2 years  . Depression   . Diverticulosis of colon   . Dizzy spells   . Elbow fracture, left 2009   [name]  . Glaucoma   . History of TIAs   . Hyperkeratosis   . LBP (low back pain)   . Melanoma [facility]) 2009   x3 [name]  . Osteoarthritis   . Restless leg syndrome   . Tinnitus   . Tobacco abuse    Past Surgical History:  Procedure Laterality Date  . CATARACT EXTRACTION    . mda     Dr. [name] wet injection  . MELANOMA EXCISION  2009   x3  . RECONSTRUCTION MEDIAL COLLATERAL LIGAMENT ELBOW W/ TENDON GRAFT  2009   Left, [name]    FAMILY HISTORY Family History  Problem Relation Age of Onset  . Cancer Mother        ?breast  . Cancer  Father        Lung    SOCIAL HISTORY Social History   Tobacco Use  . Smoking status: Former Smoker    Packs/day: 3.00    Years: 38.00    Pack years: 114.00    Types: Cigarettes    Quit date: 08/30/1990    Years since quitting: 29.5  . Smokeless tobacco: Never Used  . Tobacco comment: states he quit 1986  Vaping Use  . Vaping Use: Never used  Substance Use Topics  . Alcohol use: No    Alcohol/week: 0.0 standard drinks    Comment: PREVIOUS - DRY 13 yrs  . Drug use: Not Currently         OPHTHALMIC EXAM:  Base Eye Exam    Visual Acuity (ETDRS)      Right Left   Dist [place] 20/20 -2 20/70 -2   Dist ph [place]  20/70 +2       Tonometry (Tonopen, 8:05 AM)      Right Left   Pressure 11 12       Pupils      Pupils Dark Light Shape React APD   Right PERRL 3 2 Round Sluggish None   Left PERRL 3 2 Round Sluggish None       Visual Fields (Counting fingers)      Left Right    Full Full       Extraocular Movement      Right Left    Full Full       Neuro/Psych    Oriented x3: Yes   Mood/Affect: Normal       Dilation    Left eye: 1.0% Mydriacyl, 2.5% Phenylephrine @ 8:08 AM        Slit Lamp and Fundus Exam    External Exam      Right Left   External Normal Normal       Slit Lamp Exam      Right Left   Lids/Lashes Normal Normal   Conjunctiva/Sclera White and quiet White and quiet   Cornea Clear Clear   Anterior Chamber Deep and quiet Deep and quiet   Iris Round and reactive Round and reactive   Lens Posterior chamber intraocular lens Posterior chamber intraocular lens   Anterior Vitreous Normal Normal       Fundus Exam      Right Left   Posterior Vitreous  Posterior vitreous detachment   Disc  Normal   C/D Ratio  0.6  Macula  Retinal pigment epithelial detachment, Retinal pigment epithelial mottling, Drusen, Hard drusen, Atrophy, , intermediate age related macular degeneration   Vessels  Normal   Periphery  Normal          IMAGING AND  PROCEDURES  Imaging and Procedures for 02/25/20  OCT, Retina - OU - Both Eyes       Right Eye Quality was good. Scan locations included subfoveal. Central Foveal Thickness: 276. Findings include abnormal foveal contour, retinal drusen , no SRF, no IRF.   Left Eye Quality was good. Scan locations included subfoveal. Central Foveal Thickness: 241. Progression has improved. Findings include subretinal fluid, pigment epithelial detachment.   Notes Improved, OS, ON 7 WEEK INTERVAL ON EYLEA,       Intravitreal Injection, Pharmacologic Agent - OS - Left Eye       Time Out 02/25/2020. 8:37 AM. Confirmed correct patient, procedure, site, and patient consented.   Anesthesia Topical anesthesia was used. Anesthetic medications included [place] 3.5%.   Procedure Preparation included Tobramycin 0.3%, 10% betadine to eyelids, 5% betadine to ocular surface. A 30 gauge needle was used.   Injection:  2 mg aflibercept Alfonse Flavors) SOLN   NDC: 33007-622-63   Route: Intravitreal, Site: Left Eye, Waste: 0 mg  Post-op Post injection exam found visual acuity of at least counting fingers. The patient tolerated the procedure well. There were no complications. The patient received written and verbal post procedure care education. Post injection medications were not given.                 ASSESSMENT/PLAN:  Exudative age-related macular degeneration of left eye with active choroidal neovascularization (HCC) The nature of wet macular degeneration was discussed with the patient.  Forms of therapy reviewed include the use of Anti-VEGF medications injected painlessly into the eye, as well as other possible treatment modalities, including thermal laser therapy. Fellow eye involvement and risks were discussed with the patient. Upon the finding of wet age related macular degeneration, treatment will be offered. The treatment regimen is on a treat as needed basis with the intent to treat if necessary and extend  interval of exams when possible. On average 1 out of 6 patients do not need lifetime therapy. However, the risk of recurrent disease is high for a lifetime.  Initially monthly, then periodic, examinations and evaluations will determine whether the next treatment is required on the day of the examination. OS, much less subretinal fluid overlying pigment epithelial detachment.  Currently on 7-week interval this condition in the left eye has been resistant to Avastin in the past.  We will repeat intravitreal Eylea OS today and examination in 8 weeks      ICD-10-CM   1. Exudative age-related macular degeneration of left eye with active choroidal neovascularization (HCC)  H35.3221 OCT, Retina - OU - Both Eyes    Intravitreal Injection, Pharmacologic Agent - OS - Left Eye    aflibercept (EYLEA) SOLN 2 mg    1.  OS, improved with much less subretinal fluid now on intravitreal Eylea.  2.  OS, proven resistant to intravitreal Avastin in the past.  3.  Acuity OS as an annual generally improved and now stabilized  Repeat examination OU in 8 weeks and likely Eylea OS  Ophthalmic Meds Ordered this visit:  Meds ordered this encounter  Medications  . aflibercept (EYLEA) SOLN 2 mg       Return in about 8 weeks (around 04/21/2020) for DILATE OU, EYLEA OCT, OS.  There are  no Patient Instructions on file for this visit.   Explained the diagnoses, plan, and follow up with the patient and they expressed understanding.  Patient expressed understanding of the importance of proper follow up care.   Clent Demark Shandel Busic M.D. Diseases & Surgery of the Retina and Vitreous [facility] [place] 02/25/20     Abbreviations: M myopia (nearsighted); A astigmatism; H hyperopia (farsighted); P presbyopia; Mrx spectacle prescription;  CTL contact lenses; OD right eye; OS left eye; OU both eyes  XT exotropia; ET esotropia; PEK punctate epithelial keratitis; PEE punctate epithelial erosions; DES dry eye  syndrome; MGD meibomian gland dysfunction; ATs artificial tears; PFAT's preservative free artificial tears; [place] nuclear sclerotic cataract; PSC posterior subcapsular cataract; ERM epi-retinal membrane; PVD posterior vitreous detachment; RD retinal detachment; DM diabetes mellitus; DR diabetic retinopathy; NPDR non-proliferative diabetic retinopathy; PDR proliferative diabetic retinopathy; CSME clinically significant macular edema; DME diabetic macular edema; dbh dot blot hemorrhages; CWS cotton wool spot; POAG primary open angle glaucoma; C/D cup-to-disc ratio; HVF humphrey visual field; GVF goldmann visual field; OCT optical coherence tomography; IOP intraocular pressure; BRVO Branch retinal vein occlusion; CRVO central retinal vein occlusion; CRAO central retinal artery occlusion; BRAO branch retinal artery occlusion; RT retinal tear; SB scleral buckle; PPV pars plana vitrectomy; VH Vitreous hemorrhage; PRP panretinal laser photocoagulation; IVK intravitreal kenalog; VMT vitreomacular traction; MH Macular hole;  NVD neovascularization of the disc; NVE neovascularization elsewhere; AREDS age related eye disease study; ARMD age related macular degeneration; POAG primary open angle glaucoma; EBMD epithelial/anterior basement membrane dystrophy; ACIOL anterior chamber intraocular lens; IOL intraocular lens; PCIOL posterior chamber intraocular lens; Phaco/IOL phacoemulsification with intraocular lens placement; [place] photorefractive keratectomy; LASIK laser assisted in situ keratomileusis; HTN hypertension; DM diabetes mellitus; COPD chronic obstructive pulmonary disease

## 2020-03-02 ENCOUNTER — Other Ambulatory Visit: Payer: Self-pay | Admitting: Internal Medicine

## 2020-03-15 ENCOUNTER — Encounter: Payer: Self-pay | Admitting: Internal Medicine

## 2020-03-15 ENCOUNTER — Other Ambulatory Visit: Payer: Self-pay

## 2020-03-15 ENCOUNTER — Ambulatory Visit (INDEPENDENT_AMBULATORY_CARE_PROVIDER_SITE_OTHER): Payer: PPO | Admitting: Internal Medicine

## 2020-03-15 ENCOUNTER — Ambulatory Visit: Payer: PPO | Admitting: Internal Medicine

## 2020-03-15 VITALS — BP 138/68 | HR 65 | Temp 97.9°F | Ht 72.0 in | Wt 202.0 lb

## 2020-03-15 DIAGNOSIS — E538 Deficiency of other specified B group vitamins: Secondary | ICD-10-CM | POA: Diagnosis not present

## 2020-03-15 DIAGNOSIS — Z23 Encounter for immunization: Secondary | ICD-10-CM | POA: Diagnosis not present

## 2020-03-15 DIAGNOSIS — M545 Low back pain, unspecified: Secondary | ICD-10-CM

## 2020-03-15 DIAGNOSIS — G8929 Other chronic pain: Secondary | ICD-10-CM | POA: Diagnosis not present

## 2020-03-15 DIAGNOSIS — I2510 Atherosclerotic heart disease of native coronary artery without angina pectoris: Secondary | ICD-10-CM | POA: Diagnosis not present

## 2020-03-15 DIAGNOSIS — I10 Essential (primary) hypertension: Secondary | ICD-10-CM | POA: Diagnosis not present

## 2020-03-15 DIAGNOSIS — E559 Vitamin D deficiency, unspecified: Secondary | ICD-10-CM | POA: Diagnosis not present

## 2020-03-15 DIAGNOSIS — N401 Enlarged prostate with lower urinary tract symptoms: Secondary | ICD-10-CM | POA: Diagnosis not present

## 2020-03-15 DIAGNOSIS — G629 Polyneuropathy, unspecified: Secondary | ICD-10-CM | POA: Diagnosis not present

## 2020-03-15 MED ORDER — HYDROCODONE-ACETAMINOPHEN 7.5-325 MG PO TABS: 1.0000 | ORAL_TABLET | Freq: Four times a day (QID) | ORAL | 0 refills | Status: DC | PRN

## 2020-03-15 NOTE — Addendum Note (Signed)
Addended by: Lauralee Evener C on: 03/15/2020 10:50 AM   Modules accepted: Orders

## 2020-03-15 NOTE — Assessment & Plan Note (Signed)
Tamsulosin, Hytrin

## 2020-03-15 NOTE — Progress Notes (Signed)
Subjective:  Patient ID: Justin Pagan Su Grand., male    DOB: 04-26-1941  Age: 79 y.o. MRN: [id number]  CC: No chief complaint on file.   HPI Justin [name]. presents for chronic pain, BPH, RLS f/u C/o LBP after vaccuming  Outpatient Medications Prior to Visit  Medication Sig Dispense Refill  . aspirin 81 MG EC tablet Take 1 tablet (81 mg total) by mouth daily. 100 tablet 3  . carbidopa-levodopa (SINEMET IR) 25-100 MG tablet For breakthrough restless leg, you can take 1 tablet. 30 tablet 5  . Cholecalciferol 1000 UNITS tablet Take 3,000 Units by mouth daily.     . Cyanocobalamin (VITAMIN B-12) 1000 MCG SUBL DISSOLVE 2 TABLETS IN MOUTH EVERY DAY 60 tablet 8  . finasteride (PROSCAR) 5 MG tablet TAKE 1 TABLET BY MOUTH EVERY DAY 90 tablet 3  . furosemide (LASIX) 20 MG tablet Take 1-2 tablets (20-40 mg total) by mouth daily. 60 tablet 3  . gabapentin (NEURONTIN) 300 MG capsule TAKE 1 CAPSULE (300 MG TOTAL) BY MOUTH 4 (FOUR) TIMES DAILY. 360 capsule 1  . meclizine (ANTIVERT) 12.5 MG tablet TAKE 1 TABLET (12.5 MG TOTAL) BY MOUTH 3 (THREE) TIMES DAILY AS NEEDED FOR DIZZINESS. 60 tablet 1  . Multiple Vitamin (MULTIVITAMIN) capsule Take 1 capsule by mouth daily.      . Multiple Vitamins-Minerals (PRESERVISION AREDS PO) Take 2 tablets by mouth 2 (two) times daily.    . pravastatin (PRAVACHOL) 20 MG tablet TAKE 1 TABLET BY MOUTH EVERY DAY 90 tablet 1  . rOPINIRole (REQUIP) 2 MG tablet TAKE 1 TABLET BY MOUTH AT 12PM, 1 TABLET AT 4PM AND 2 TABLETS AT BEDTIME 360 tablet 1  . tamsulosin (FLOMAX) 0.4 MG CAPS capsule TAKE 1 CAPSULE BY MOUTH EVERY DAY 90 capsule 3  . terazosin (HYTRIN) 2 MG capsule TAKE 1 CAPSULE BY MOUTH EVERYDAY AT BEDTIME 90 capsule 3  . triamcinolone cream (KENALOG) 0.1 % Apply 1 application topically 2 (two) times daily. 100 g 0  . HYDROcodone-acetaminophen (NORCO) 7.5-325 MG tablet Take 1 tablet by mouth every 6 (six) hours as needed for severe pain. 120 tablet 0  .  HYDROcodone-acetaminophen (NORCO) 7.5-325 MG tablet Take 1 tablet by mouth every 6 (six) hours as needed for severe pain. 120 tablet 0  . HYDROcodone-acetaminophen (NORCO) 7.5-325 MG tablet Take 1 tablet by mouth every 6 (six) hours as needed for severe pain. Please fill on or after 12/11/19 60 tablet 0   No facility-administered medications prior to visit.    ROS: Review of Systems  Constitutional: Negative for appetite change, fatigue and unexpected weight change.  HENT: Negative for congestion, nosebleeds, sneezing, sore throat and trouble swallowing.   Eyes: Negative for itching and visual disturbance.  Respiratory: Negative for cough.   Cardiovascular: Negative for chest pain, palpitations and leg swelling.  Gastrointestinal: Negative for abdominal distention, blood in stool, diarrhea and nausea.  Genitourinary: Negative for frequency and hematuria.  Musculoskeletal: Positive for arthralgias and back pain. Negative for gait problem, joint swelling and neck pain.  Skin: Negative for rash.  Neurological: Negative for dizziness, tremors, speech difficulty and weakness.  Psychiatric/Behavioral: Positive for sleep disturbance. Negative for agitation and dysphoric mood. The patient is not nervous/anxious.     Objective:  BP 138/68 (BP Location: Right Arm, Patient Position: Sitting, Cuff Size: Large)   Pulse 65   Temp 97.9 F (36.6 C) (Oral)   Ht 6' (1.829 m)   Wt 202 lb (91.6 kg)   SpO2  95%   BMI 27.40 kg/m   BP Readings from Last 3 Encounters:  03/15/20 138/68  12/10/19 (!) 162/84  10/27/19 (!) 154/72    Wt Readings from Last 3 Encounters:  03/15/20 202 lb (91.6 kg)  12/10/19 204 lb 6 oz (92.7 kg)  10/27/19 206 lb (93.4 kg)    Physical Exam Constitutional:      General: He is not in acute distress.    Appearance: Normal appearance. He is well-developed.     Comments: NAD  Eyes:     Conjunctiva/sclera: Conjunctivae normal.     Pupils: Pupils are equal, round, and  reactive to light.  Neck:     Thyroid: No thyromegaly.     Vascular: No JVD.  Cardiovascular:     Rate and Rhythm: Normal rate and regular rhythm.     Heart sounds: Normal heart sounds. No murmur heard.  No friction rub. No gallop.   Pulmonary:     Effort: Pulmonary effort is normal. No respiratory distress.     Breath sounds: Normal breath sounds. No wheezing or rales.  Chest:     Chest wall: No tenderness.  Abdominal:     General: Bowel sounds are normal. There is no distension.     Palpations: Abdomen is soft. There is no mass.     Tenderness: There is no abdominal tenderness. There is no guarding or rebound.  Musculoskeletal:        General: Tenderness present. Normal range of motion.     Cervical back: Normal range of motion.  Lymphadenopathy:     Cervical: No cervical adenopathy.  Skin:    General: Skin is warm and dry.     Findings: No rash.  Neurological:     Mental Status: He is alert and oriented to person, place, and time.     Cranial Nerves: No cranial nerve deficit.     Motor: No abnormal muscle tone.     Coordination: Coordination normal.     Gait: Gait normal.     Deep Tendon Reflexes: Reflexes are normal and symmetric.  Psychiatric:        Behavior: Behavior normal.        Thought Content: Thought content normal.        Judgment: Judgment normal.     Lab Results  Component Value Date   WBC 5.1 10/16/2019   HGB 10.3 (L) 10/16/2019   HCT 30.0 (L) 10/16/2019   PLT 160.0 10/16/2019   GLUCOSE 94 10/27/2019   CHOL 136 01/12/2017   TRIG 67.0 01/12/2017   HDL 54.70 01/12/2017   LDLCALC 68 01/12/2017   ALT 13 10/16/2019   AST 21 10/16/2019   NA 139 10/27/2019   K 4.4 10/27/2019   CL 105 10/27/2019   CREATININE 1.21 10/27/2019   BUN 24 (H) 10/27/2019   CO2 28 10/27/2019   TSH 3.05 10/16/2019   PSA 0.47 08/30/2016   HGBA1C 5.6 07/28/2015    VAS [place] LOWER EXTREMITY VENOUS (DVT)  Result Date: 10/29/2019  Lower Venous DVTStudy Indications: Edema.   Limitations: Poor ultrasound/tissue interface. Comparison Study: 01/12/2017 negative left lower extremity venous duplex Performing Technologist: Maudry Mayhew MHA, RDMS, RVT, RDCS  Examination Guidelines: A complete evaluation includes B-mode imaging, spectral Doppler, color Doppler, and power Doppler as needed of all accessible portions of each vessel. Bilateral testing is considered an integral part of a complete examination. Limited examinations for reoccurring indications may be performed as noted. The reflux portion of the exam is performed with  the patient in reverse Trendelenburg.  +---------+---------------+---------+-----------+----------+--------------+ RIGHT    CompressibilityPhasicitySpontaneityPropertiesThrombus Aging +---------+---------------+---------+-----------+----------+--------------+ CFV      Full           Yes      Yes                                 +---------+---------------+---------+-----------+----------+--------------+ SFJ      Full                                                        +---------+---------------+---------+-----------+----------+--------------+ FV Prox  Full                                                        +---------+---------------+---------+-----------+----------+--------------+ FV Mid   Full                                                        +---------+---------------+---------+-----------+----------+--------------+ FV DistalFull                                                        +---------+---------------+---------+-----------+----------+--------------+ PFV      Full                                                        +---------+---------------+---------+-----------+----------+--------------+ POP      Full           Yes      Yes                                 +---------+---------------+---------+-----------+----------+--------------+ PTV      Full                                                         +---------+---------------+---------+-----------+----------+--------------+ PERO     Full                                                        +---------+---------------+---------+-----------+----------+--------------+   +---------+---------------+---------+-----------+----------+--------------+ LEFT     CompressibilityPhasicitySpontaneityPropertiesThrombus Aging +---------+---------------+---------+-----------+----------+--------------+ CFV      Full           Yes      Yes                                 +---------+---------------+---------+-----------+----------+--------------+  SFJ      Full                                                        +---------+---------------+---------+-----------+----------+--------------+ FV Prox  Full                                                        +---------+---------------+---------+-----------+----------+--------------+ FV Mid   Full                                                        +---------+---------------+---------+-----------+----------+--------------+ FV DistalFull                                                        +---------+---------------+---------+-----------+----------+--------------+ PFV      Full                                                        +---------+---------------+---------+-----------+----------+--------------+ POP      Full           Yes      Yes                                 +---------+---------------+---------+-----------+----------+--------------+ PTV      Full                                                        +---------+---------------+---------+-----------+----------+--------------+ PERO     Full                                                        +---------+---------------+---------+-----------+----------+--------------+     Summary: RIGHT: - There is no evidence of deep vein thrombosis in the lower extremity.  - No cystic  structure found in the popliteal fossa.  LEFT: - There is no evidence of deep vein thrombosis in the lower extremity.  - No cystic structure found in the popliteal fossa.  *See table(s) above for measurements and observations. Electronically signed by Servando Snare MD on 10/29/2019 at 8:20:47 PM.    Final     Assessment & Plan:   Diagnoses and all orders for this visit:  Chronic right-sided low back pain without sciatica -     HYDROcodone-acetaminophen (NORCO) 7.5-325 MG tablet; Take  1 tablet by mouth every 6 (six) hours as needed for severe pain. Please fill on or after 05/17/20  Neuropathy -     HYDROcodone-acetaminophen (NORCO) 7.5-325 MG tablet; Take 1 tablet by mouth every 6 (six) hours as needed for severe pain. Please fill on or after 05/17/20  Other orders -     HYDROcodone-acetaminophen (NORCO) 7.5-325 MG tablet; Take 1 tablet by mouth every 6 (six) hours as needed for severe pain. -     HYDROcodone-acetaminophen (NORCO) 7.5-325 MG tablet; Take 1 tablet by mouth every 6 (six) hours as needed for severe pain.     Meds ordered this encounter  Medications  . HYDROcodone-acetaminophen (NORCO) 7.5-325 MG tablet    Sig: Take 1 tablet by mouth every 6 (six) hours as needed for severe pain.    Dispense:  120 tablet    Refill:  0    Please fill on or after 03/18/20  . HYDROcodone-acetaminophen (NORCO) 7.5-325 MG tablet    Sig: Take 1 tablet by mouth every 6 (six) hours as needed for severe pain.    Dispense:  120 tablet    Refill:  0    Please fill on or after 04/17/20  . HYDROcodone-acetaminophen (NORCO) 7.5-325 MG tablet    Sig: Take 1 tablet by mouth every 6 (six) hours as needed for severe pain. Please fill on or after 05/17/20    Dispense:  60 tablet    Refill:  0     Follow-up: No follow-ups on file.  [name], MD

## 2020-03-15 NOTE — Assessment & Plan Note (Signed)
Hytrin 

## 2020-03-15 NOTE — Assessment & Plan Note (Signed)
On B12 

## 2020-03-15 NOTE — Assessment & Plan Note (Addendum)
Worse On Norco  Potential benefits of a long term opioids use as well as potential risks (i.e. addiction risk, apnea etc) and complications (i.e. Somnolence, constipation and others) were explained to the patient and were aknowledged.

## 2020-03-15 NOTE — Assessment & Plan Note (Signed)
Vit D 

## 2020-03-15 NOTE — Assessment & Plan Note (Signed)
Pravastatin, baby aspirin 

## 2020-03-22 ENCOUNTER — Other Ambulatory Visit: Payer: Self-pay

## 2020-03-22 ENCOUNTER — Encounter: Payer: Self-pay | Admitting: Neurology

## 2020-03-22 ENCOUNTER — Ambulatory Visit: Payer: PPO | Admitting: Neurology

## 2020-03-22 VITALS — BP 173/80 | HR 72 | Resp 18 | Ht 72.0 in | Wt 205.0 lb

## 2020-03-22 DIAGNOSIS — G629 Polyneuropathy, unspecified: Secondary | ICD-10-CM | POA: Diagnosis not present

## 2020-03-22 DIAGNOSIS — G2581 Restless legs syndrome: Secondary | ICD-10-CM | POA: Diagnosis not present

## 2020-03-22 MED ORDER — ROPINIROLE HCL 2 MG PO TABS
ORAL_TABLET | ORAL | 1 refills | Status: DC
Start: 2020-03-22 — End: 2021-08-11

## 2020-03-22 MED ORDER — CARBIDOPA-LEVODOPA 25-100 MG PO TABS: ORAL_TABLET | ORAL | 3 refills | Status: AC

## 2020-03-22 NOTE — Patient Instructions (Addendum)
Check ferritin  Adjust ropinirole to 2mg  at noon, 2mg  at 4pm, and 2mg  at bedtime  Continue gabapentin 300mg  afternoon, 300mg  at 4pm, and 600mg  at bedtime  Send a MyChart message in 4-6 weeks with an update  Return to clinic in 6 months  Your provider has requested that you have labwork completed today. Please go to [facility] ([id number]) on the second floor of this building before leaving the office today. You do not need to check in. If you are not called within 15 minutes please check with the front desk.

## 2020-03-22 NOTE — Progress Notes (Addendum)
Follow-up Visit   Date: 03/22/20   [name]. MRN: [id number] DOB: [birth year]   Interim History: Justin [name] Justin [name]. is a 79 y.o. male with BPH, history of alcohol abuse, and depression returning to the clinic for follow-up of RLS and neuropathy.  The patient was accompanied to the clinic by self.  He is here for follow-up visit.  He was able to reduce his ropinirole to 4mg  in the afternoon and 2mg  at bedtime and did not appreciate any worsening symptoms.  Adding sinemet at bedtime has helped his nighttime discomfort and he no longer wakes up at night.  Unfortunately, he reports having daytime discomfort and is not able to take a mid-day nap because of the severity of RLS.  Neuropathy is stable on gabapentin 300mg  at noon, 6pm, and 1200mg  at bedtime.  No falls.  Medications:  Current Outpatient Medications on File Prior to Visit  Medication Sig Dispense Refill  . aspirin 81 MG EC tablet Take 1 tablet (81 mg total) by mouth daily. 100 tablet 3  . carbidopa-levodopa (SINEMET IR) 25-100 MG tablet For breakthrough restless leg, you can take 1 tablet. 30 tablet 5  . Cholecalciferol 1000 UNITS tablet Take 3,000 Units by mouth daily.     . Cyanocobalamin (VITAMIN B-12) 1000 MCG SUBL DISSOLVE 2 TABLETS IN MOUTH EVERY DAY 60 tablet 8  . finasteride (PROSCAR) 5 MG tablet TAKE 1 TABLET BY MOUTH EVERY DAY 90 tablet 3  . furosemide (LASIX) 20 MG tablet Take 1-2 tablets (20-40 mg total) by mouth daily. 60 tablet 3  . gabapentin (NEURONTIN) 300 MG capsule TAKE 1 CAPSULE (300 MG TOTAL) BY MOUTH 4 (FOUR) TIMES DAILY. 360 capsule 1  . HYDROcodone-acetaminophen (NORCO) 7.5-325 MG tablet Take 1 tablet by mouth every 6 (six) hours as needed for severe pain. 120 tablet 0  . HYDROcodone-acetaminophen (NORCO) 7.5-325 MG tablet Take 1 tablet by mouth every 6 (six) hours as needed for severe pain. 120 tablet 0  . HYDROcodone-acetaminophen (NORCO) 7.5-325 MG tablet Take 1 tablet by mouth every  6 (six) hours as needed for severe pain. Please fill on or after 05/17/20 60 tablet 0  . meclizine (ANTIVERT) 12.5 MG tablet TAKE 1 TABLET (12.5 MG TOTAL) BY MOUTH 3 (THREE) TIMES DAILY AS NEEDED FOR DIZZINESS. 60 tablet 1  . Multiple Vitamin (MULTIVITAMIN) capsule Take 1 capsule by mouth daily.      . Multiple Vitamins-Minerals (PRESERVISION AREDS PO) Take 2 tablets by mouth 2 (two) times daily.    . pravastatin (PRAVACHOL) 20 MG tablet TAKE 1 TABLET BY MOUTH EVERY DAY 90 tablet 1  . rOPINIRole (REQUIP) 2 MG tablet TAKE 1 TABLET BY MOUTH AT 12PM, 1 TABLET AT 4PM AND 2 TABLETS AT BEDTIME 360 tablet 1  . tamsulosin (FLOMAX) 0.4 MG CAPS capsule TAKE 1 CAPSULE BY MOUTH EVERY DAY 90 capsule 3  . terazosin (HYTRIN) 2 MG capsule TAKE 1 CAPSULE BY MOUTH EVERYDAY AT BEDTIME 90 capsule 3  . triamcinolone cream (KENALOG) 0.1 % Apply 1 application topically 2 (two) times daily. 100 g 0   No current facility-administered medications on file prior to visit.    Allergies:  Allergies  Allergen Reactions  . Levofloxacin     REACTION: hands pealed    Vital Signs:  BP (!) 173/80   Pulse 72   Resp 18   Ht 6' (1.829 m)   Wt 205 lb (93 kg)   SpO2 97%   BMI 27.80 kg/m  Neurological Exam: MENTAL STATUS including orientation to time, place, person, recent and remote memory, attention span and concentration, language, and fund of knowledge is normal.  Speech is not dysarthric.  CRANIAL NERVES:  No visual field defects. Normal conjugate, extra-ocular eye movements in all directions of gaze.  No ptosis  MOTOR:  Motor strength is 5/5 in all extremities, except left hand with prominent weakness in ulnar -innervated muscles.  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  MSRs:  Reflexes are 2+/4 throughout, except 1+/4 distally in the feet.  SENSORY:  Absent vibration at the feet.  COORDINATION/GAIT:  Normal finger-to- nose-finger.  Intact rapid alternating movements bilaterally.  Gait  slow, mildly wide-based and stable.   Data: n/a  IMPRESSION/PLAN: 1.  Peripheral neuropathy contributed by age and history of alcohol abuse  - Continue gabapentin 300mg  afternoon, 300mg  at 4pm, and 600mg  at bedtime  - Patient educated on daily foot inspection, fall prevention, and safety precautions around the home.  2.  Restless leg syndrome  - Adjust ropinirole to 2mg  at noon, 2mg  at 4pm, and 2mg  at bedtime  - Continue sinemet 25/100 1 tablet at bedtime, ok to take extra tab as needed during the day for severe symptoms  - Check ferritin  Return to clinic in 6 months.   Thank you for allowing me to participate in patient's care.  If I can answer any additional questions, I would be pleased to do so.    Sincerely,    Charley Miske K. Posey Pronto, DO

## 2020-03-23 ENCOUNTER — Other Ambulatory Visit (INDEPENDENT_AMBULATORY_CARE_PROVIDER_SITE_OTHER): Payer: PPO

## 2020-03-23 DIAGNOSIS — G2581 Restless legs syndrome: Secondary | ICD-10-CM | POA: Diagnosis not present

## 2020-03-23 DIAGNOSIS — G629 Polyneuropathy, unspecified: Secondary | ICD-10-CM

## 2020-03-23 LAB — FERRITIN: Ferritin: 40.7 ng/mL (ref 22.0–322.0)

## 2020-03-24 ENCOUNTER — Ambulatory Visit: Payer: PPO | Admitting: Internal Medicine

## 2020-03-30 ENCOUNTER — Telehealth: Payer: Self-pay | Admitting: Neurology

## 2020-03-30 NOTE — Telephone Encounter (Signed)
Called patient and informed him of results and recommendations. Patient verbalized understanding and will go to get iron supplement.

## 2020-03-30 NOTE — Telephone Encounter (Signed)
Patient is wanting to know the results of the Blood work he had done

## 2020-03-30 NOTE — Telephone Encounter (Signed)
Results released to [place].   With restless leg syndrome, we like for your iron (ferritin) to be > 50 as this can help symptoms. Please start over the counter ferrous sulfate 325mg  daily - increase fiber and hydration while on this supplement, since it may cause constipation and stool to be darker.

## 2020-04-21 ENCOUNTER — Encounter (INDEPENDENT_AMBULATORY_CARE_PROVIDER_SITE_OTHER): Payer: Self-pay | Admitting: Ophthalmology

## 2020-04-21 ENCOUNTER — Other Ambulatory Visit: Payer: Self-pay

## 2020-04-21 ENCOUNTER — Ambulatory Visit (INDEPENDENT_AMBULATORY_CARE_PROVIDER_SITE_OTHER): Payer: PPO | Admitting: Ophthalmology

## 2020-04-21 DIAGNOSIS — H353111 Nonexudative age-related macular degeneration, right eye, early dry stage: Secondary | ICD-10-CM

## 2020-04-21 DIAGNOSIS — H43812 Vitreous degeneration, left eye: Secondary | ICD-10-CM | POA: Diagnosis not present

## 2020-04-21 DIAGNOSIS — H353221 Exudative age-related macular degeneration, left eye, with active choroidal neovascularization: Secondary | ICD-10-CM | POA: Diagnosis not present

## 2020-04-21 MED ORDER — AFLIBERCEPT 2MG/0.05ML IZ SOLN FOR KALEIDOSCOPE
2.0000 mg | INTRAVITREAL | Status: AC | PRN
  Administered 2020-04-21: 2 mg via INTRAVITREAL

## 2020-04-21 NOTE — Assessment & Plan Note (Signed)

## 2020-04-21 NOTE — Assessment & Plan Note (Signed)
OD, stable, no active CNVM by clinical examination or by OCT today

## 2020-04-21 NOTE — Progress Notes (Signed)
04/21/2020     CHIEF COMPLAINT Patient presents for Retina Follow Up   HISTORY OF PRESENT ILLNESS: Justin Boateng. is a 79 y.o. male who presents to the clinic today for:   HPI    Retina Follow Up    Patient presents with  Wet AMD.  In left eye.  This started 8 weeks ago.  Severity is mild.  Duration of 8 weeks.  Since onset it is stable.          Comments    8 Week AMD F/U OU, poss Eylea OS  Pt sts "reader board" on TV is getting harder to see OU. Pt denies any other new symptoms OU.       Last edited by Rockie Neighbours, [place] on 04/21/2020  8:20 AM. (History)      Referring physician: [name], MD [place],  [place] [id number]  HISTORICAL INFORMATION:   Selected notes from the MEDICAL RECORD NUMBER    Lab Results  Component Value Date   HGBA1C 5.6 07/28/2015     CURRENT MEDICATIONS: No current outpatient medications on file. (Ophthalmic Drugs)   No current facility-administered medications for this visit. (Ophthalmic Drugs)   Current Outpatient Medications (Other)  Medication Sig  . aspirin 81 MG EC tablet Take 1 tablet (81 mg total) by mouth daily.  . carbidopa-levodopa (SINEMET IR) 25-100 MG tablet For breakthrough restless leg, you can take 1 tablet at bedtime.  OK to take extra tab as needed during the day.  . Cholecalciferol 1000 UNITS tablet Take 3,000 Units by mouth daily.   . Cyanocobalamin (VITAMIN B-12) 1000 MCG SUBL DISSOLVE 2 TABLETS IN MOUTH EVERY DAY  . finasteride (PROSCAR) 5 MG tablet TAKE 1 TABLET BY MOUTH EVERY DAY  . furosemide (LASIX) 20 MG tablet Take 1-2 tablets (20-40 mg total) by mouth daily.  [name] gabapentin (NEURONTIN) 300 MG capsule TAKE 1 CAPSULE (300 MG TOTAL) BY MOUTH 4 (FOUR) TIMES DAILY.  [name] HYDROcodone-acetaminophen (NORCO) 7.5-325 MG tablet Take 1 tablet by mouth every 6 (six) hours as needed for severe pain.  [name] HYDROcodone-acetaminophen (NORCO) 7.5-325 MG tablet Take 1 tablet by mouth every 6 (six)  hours as needed for severe pain.  [name] HYDROcodone-acetaminophen (NORCO) 7.5-325 MG tablet Take 1 tablet by mouth every 6 (six) hours as needed for severe pain. Please fill on or after 05/17/20  . meclizine (ANTIVERT) 12.5 MG tablet TAKE 1 TABLET (12.5 MG TOTAL) BY MOUTH 3 (THREE) TIMES DAILY AS NEEDED FOR DIZZINESS.  . Multiple Vitamin (MULTIVITAMIN) capsule Take 1 capsule by mouth daily.    . Multiple Vitamins-Minerals (PRESERVISION AREDS PO) Take 2 tablets by mouth 2 (two) times daily.  . pravastatin (PRAVACHOL) 20 MG tablet TAKE 1 TABLET BY MOUTH EVERY DAY  . rOPINIRole (REQUIP) 2 MG tablet TAKE 1 TABLET BY MOUTH AT 12PM, 1 TABLET AT 4PM AND 1 TABLETS AT BEDTIME  . tamsulosin (FLOMAX) 0.4 MG CAPS capsule TAKE 1 CAPSULE BY MOUTH EVERY DAY  . terazosin (HYTRIN) 2 MG capsule TAKE 1 CAPSULE BY MOUTH EVERYDAY AT BEDTIME  . triamcinolone cream (KENALOG) 0.1 % Apply 1 application topically 2 (two) times daily.   No current facility-administered medications for this visit. (Other)      REVIEW OF SYSTEMS:    ALLERGIES Allergies  Allergen Reactions  . Levofloxacin     REACTION: hands pealed    PAST MEDICAL HISTORY Past Medical History:  Diagnosis Date  . Alcoholism ([place])  Dry 13 years  . Basal cell carcinoma    Right Chest  . Chronic insomnia    On ambien x 2 years  . Depression   . Diverticulosis of colon   . Dizzy spells   . Elbow fracture, left 2009   [name]  . Glaucoma   . History of TIAs   . Hyperkeratosis   . LBP (low back pain)   . Melanoma [facility]) 2009   x3 [name]  . Osteoarthritis   . Restless leg syndrome   . Tinnitus   . Tobacco abuse    Past Surgical History:  Procedure Laterality Date  . CATARACT EXTRACTION    . mda     Dr. [name] wet injection  . MELANOMA EXCISION  2009   x3  . RECONSTRUCTION MEDIAL COLLATERAL LIGAMENT ELBOW W/ TENDON GRAFT  2009   Left, [name]    FAMILY HISTORY Family History  Problem Relation Age of Onset  . Cancer  Mother        ?breast  . Cancer Father        Lung    SOCIAL HISTORY Social History   Tobacco Use  . Smoking status: Former Smoker    Packs/day: 3.00    Years: 38.00    Pack years: 114.00    Types: Cigarettes    Quit date: 08/30/1990    Years since quitting: 29.6  . Smokeless tobacco: Never Used  . Tobacco comment: states he quit 1986  Vaping Use  . Vaping Use: Never used  Substance Use Topics  . Alcohol use: No    Alcohol/week: 0.0 standard drinks    Comment: PREVIOUS - DRY 13 yrs  . Drug use: Not Currently         OPHTHALMIC EXAM:  Base Eye Exam    Visual Acuity (ETDRS)      Right Left   Dist [place] 20/20 -2 20/60ecc +1   Dist ph [place]  NI       Tonometry (Tonopen, 8:20 AM)      Right Left   Pressure 09 10       Pupils      Pupils Dark Light Shape React APD   Right PERRL 3 2 Round Sluggish None   Left PERRL 3 2 Round Sluggish None       Visual Fields (Counting fingers)      Left Right    Full Full       Extraocular Movement      Right Left    Full Full       Neuro/Psych    Oriented x3: Yes   Mood/Affect: Normal       Dilation    Both eyes: 1.0% Mydriacyl, 2.5% Phenylephrine @ 8:24 AM        Slit Lamp and Fundus Exam    External Exam      Right Left   External Normal Normal       Slit Lamp Exam      Right Left   Lids/Lashes Normal Normal   Conjunctiva/Sclera White and quiet White and quiet   Cornea Clear Clear   Anterior Chamber Deep and quiet Deep and quiet   Iris Round and reactive Round and reactive   Lens Posterior chamber intraocular lens Posterior chamber intraocular lens   Anterior Vitreous Normal Normal       Fundus Exam      Right Left   Posterior Vitreous Posterior vitreous detachment Posterior vitreous detachment  Disc Normal Normal   C/D Ratio 0.5 0.6   Macula Hard drusen, no exudates, no membrane, Retinal pigment epithelial mottling Retinal pigment epithelial detachment, Retinal pigment epithelial mottling, Drusen,  Hard drusen, Atrophy, , intermediate age related macular degeneration   Vessels  Normal   Periphery  Normal          IMAGING AND PROCEDURES  Imaging and Procedures for 04/21/20  OCT, Retina - OU - Both Eyes       Right Eye Quality was good. Scan locations included subfoveal. Central Foveal Thickness: 276. Progression has been stable. Findings include retinal drusen .   Left Eye Quality was good. Scan locations included subfoveal. Central Foveal Thickness: 252. Progression has improved. Findings include no SRF, pigment epithelial detachment, no IRF.   Notes No subretinal fluid left eye remains, vascularized pigment epithelial detachment subfoveal left eye.       Intravitreal Injection, Pharmacologic Agent - OS - Left Eye       Time Out 04/21/2020. 8:51 AM. Confirmed correct patient, procedure, site, and patient consented.   Anesthesia Topical anesthesia was used. Anesthetic medications included [place] 3.5%.   Procedure Preparation included Tobramycin 0.3%, 10% betadine to eyelids, 5% betadine to ocular surface. A 30 gauge needle was used.   Injection:  2 mg aflibercept Alfonse Flavors) SOLN   NDC: [id number], Lot: [id number]   Route: Intravitreal, Site: Left Eye, Waste: 0 mg  Post-op Post injection exam found visual acuity of at least counting fingers. The patient tolerated the procedure well. There were no complications. The patient received written and verbal post procedure care education. Post injection medications were not given.                 ASSESSMENT/PLAN:  Exudative age-related macular degeneration of left eye with active choroidal neovascularization (HCC) OS, with vascularized pigment epithelial detachment subfoveal and prior serous retinal detachment now controlled and improved on intravitreal Eylea with stabilization of acuity.  We will repeat injection today and reexamination left eye in 8 weeks.  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  An informational brochure was given to the patient.  All the patient's questions were answered.  The patient was asked to return if new or different flashes or floaters develops.   Patient was instructed to contact office immediately if any changes were noticed. I explained to the patient that vitreous inside the eye is similar to jello inside a bowl. As the jello melts it can start to pull away from the bowl, similarly the vitreous throughout our lives can begin to pull away from the retina. That process is called a posterior vitreous detachment. In some cases, the vitreous can tug hard enough on the retina to form a retinal tear. I discussed with the patient the signs and symptoms of a retinal detachment.  Do not rub the eye.      ICD-10-CM   1. Exudative age-related macular degeneration of left eye with active choroidal neovascularization (HCC)  H35.3221 OCT, Retina - OU - Both Eyes    Intravitreal Injection, Pharmacologic Agent - OS - Left Eye    aflibercept (EYLEA) SOLN 2 mg  2. Early stage nonexudative age-related macular degeneration of right eye  H35.3111   3. Posterior vitreous detachment of left eye  H43.812     1.  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  We will repeat Eylea  Injection OS today. 2.  3.  Ophthalmic Meds Ordered this visit:  Meds ordered this encounter  Medications  . aflibercept (EYLEA) SOLN 2 mg       Return in about 8 weeks (around 06/16/2020) for OS, dilate, EYLEA OCT.  Patient Instructions    Contact the office promptly for new onset visual acuity decline or distortions    Explained the diagnoses, plan, and follow up with the patient and they expressed understanding.  Patient expressed understanding of the importance of proper follow up care.   Clent Demark Wynston Romey M.D. Diseases & Surgery of the Retina and Vitreous Retina & Diabetic [place] 04/21/20     Abbreviations: M myopia (nearsighted); A astigmatism; H hyperopia (farsighted); P presbyopia; Mrx spectacle prescription;  CTL contact lenses; OD right eye; OS left eye; OU both eyes  XT exotropia; ET esotropia; PEK punctate epithelial keratitis; PEE punctate epithelial erosions; DES dry eye syndrome; MGD meibomian gland dysfunction; ATs artificial tears; PFAT's preservative free artificial tears; Ramos nuclear sclerotic cataract; PSC posterior subcapsular cataract; ERM epi-retinal membrane; PVD posterior vitreous detachment; RD retinal detachment; DM diabetes mellitus; DR diabetic retinopathy; NPDR non-proliferative diabetic retinopathy; PDR proliferative diabetic retinopathy; CSME clinically significant macular edema; DME diabetic macular edema; dbh dot blot hemorrhages; CWS cotton wool spot; POAG primary open angle glaucoma; C/D cup-to-disc ratio; HVF humphrey visual field; GVF goldmann visual field; OCT optical coherence tomography; IOP intraocular pressure; BRVO Branch retinal vein occlusion; CRVO central retinal vein occlusion; CRAO central retinal artery occlusion; BRAO branch retinal artery occlusion; RT retinal tear; SB scleral buckle; PPV pars plana vitrectomy; VH Vitreous hemorrhage; PRP panretinal laser photocoagulation; IVK intravitreal kenalog; VMT vitreomacular traction; MH Macular hole;  NVD neovascularization of the disc; NVE neovascularization elsewhere; AREDS age related eye disease study; ARMD age related macular degeneration; POAG primary open angle glaucoma; EBMD epithelial/anterior basement membrane dystrophy; ACIOL  anterior chamber intraocular lens; IOL intraocular lens; PCIOL posterior chamber intraocular lens; Phaco/IOL phacoemulsification with intraocular lens placement; [place] photorefractive keratectomy; LASIK laser assisted in situ keratomileusis; HTN hypertension; DM diabetes mellitus; COPD chronic obstructive pulmonary disease

## 2020-04-21 NOTE — Assessment & Plan Note (Signed)
OS, with vascularized pigment epithelial detachment subfoveal and prior serous retinal detachment now controlled and improved on intravitreal Eylea with stabilization of acuity.  We will repeat injection today and reexamination left eye in 8 weeks.

## 2020-04-21 NOTE — Patient Instructions (Signed)
Contact the office promptly for new onset visual acuity decline or distortions

## 2020-05-13 ENCOUNTER — Ambulatory Visit: Payer: PPO | Admitting: Podiatry

## 2020-05-13 ENCOUNTER — Other Ambulatory Visit: Payer: Self-pay

## 2020-05-13 DIAGNOSIS — G629 Polyneuropathy, unspecified: Secondary | ICD-10-CM

## 2020-05-13 DIAGNOSIS — M79674 Pain in right toe(s): Secondary | ICD-10-CM | POA: Diagnosis not present

## 2020-05-13 DIAGNOSIS — Q828 Other specified congenital malformations of skin: Secondary | ICD-10-CM

## 2020-05-13 DIAGNOSIS — M79675 Pain in left toe(s): Secondary | ICD-10-CM | POA: Diagnosis not present

## 2020-05-13 DIAGNOSIS — B351 Tinea unguium: Secondary | ICD-10-CM | POA: Diagnosis not present

## 2020-05-17 NOTE — Progress Notes (Signed)
Subjective: 79 y.o. returns the office today for painful, elongated, thickened toenails which he cannot trim himself and also a painful corn second toe on the left foot which make it difficult to wear shoes at times due to the pressure. No open lesions or drainage. Denies any redness, drainage or signs of infection. Denies any systemic complaints such as fevers, chills, nausea, vomiting.   PCP: Plotnikov, Evie Lacks, MD  Objective: AAO 3, NAD DP/PT pulses palpable, CRT less than 3 seconds Neurological status is unchanged.  Nails hypertrophic, dystrophic, elongated, brittle, discolored 10. There is tenderness overlying the nails 1-5 bilaterally. There is no surrounding erythema or drainage along the nail sites. Hyperkeratotic lesion sharply debrided x 1 without any complications or bleeding.  There is deformity present the left third toe and the toe is rigid contracture at the level of the DIPJ.   Hammertoes present.  No pain with calf compression, swelling, warmth, erythema.  Assessment: Patient presents with symptomatic onychomycosis; hyperkeratotic lesion; left third toe deformity  Plan: -Treatment options including alternatives, risks, complications were discussed -Nails sharply debrided 10 without complication/bleeding. -Hyperkeratotic lesion sharply debrided x 1 without any complications or bleeding. Continue offloading to the toes.  -Discussed daily foot inspection. If there are any changes, to call the office immediately.  -Follow-up in 9 weeks or sooner if any problems are to arise. In the meantime, encouraged to call the office with any questions, concerns, changes symptoms.  [name], DPM

## 2020-05-18 ENCOUNTER — Telehealth: Payer: Self-pay | Admitting: Neurology

## 2020-05-18 NOTE — Telephone Encounter (Signed)
Patient called to give an update to the nurse since his last visit.

## 2020-05-19 NOTE — Telephone Encounter (Signed)
Is he having pain or restless sensation?  Restless leg would make it difficult for him to take a nap, since it generally makes you want to get up and walk around.   He also has neuropathy, which would given tingling pain.    Which is making him want to "rest" his legs?

## 2020-05-19 NOTE — Telephone Encounter (Signed)
Called patient and he wanted to let Dr. Posey Pronto know that the medication has been helping him at night. However, during the day he has been having trouble with his legs. Patient stated he takes a afternoon nap after lunch to "rest" his legs. Informed patient that I would send Dr. Posey Pronto his message.

## 2020-05-20 NOTE — Telephone Encounter (Signed)
Please have him follow-up in the office to better understand his symptoms.

## 2020-05-20 NOTE — Telephone Encounter (Signed)
Called patient and he informed me that his right leg generally has the pain, however the restless legs has been bothering him. He states that it is hard to say because some days the restless legs bother him and others is the pain. Patient also informed me that it's usually the restless legs are making him want to rest.  Informed patient I would send info to Dr. Posey Pronto.

## 2020-05-20 NOTE — Telephone Encounter (Signed)
Patient scheduled for Monday, 05/24/20, at 2:30 with Dr. Posey Pronto.

## 2020-05-24 ENCOUNTER — Other Ambulatory Visit: Payer: Self-pay

## 2020-05-24 ENCOUNTER — Ambulatory Visit: Payer: PPO | Admitting: Neurology

## 2020-05-24 ENCOUNTER — Encounter: Payer: Self-pay | Admitting: Neurology

## 2020-05-24 VITALS — BP 139/84 | HR 74 | Ht 72.0 in | Wt 203.0 lb

## 2020-05-24 DIAGNOSIS — G629 Polyneuropathy, unspecified: Secondary | ICD-10-CM

## 2020-05-24 DIAGNOSIS — M79651 Pain in right thigh: Secondary | ICD-10-CM | POA: Diagnosis not present

## 2020-05-24 DIAGNOSIS — G2581 Restless legs syndrome: Secondary | ICD-10-CM

## 2020-05-24 NOTE — Progress Notes (Signed)
Follow-up Visit   Date: 05/24/20   Onward. MRN: [id number] DOB: October 05, 1940   Interim History: Justin [name]. is a 79 y.o. male with BPH, history of alcohol abuse, and depression returning to the clinic with complaints of right thigh pain.  He was previously seen for RLS and neuropathy.  The patient was accompanied to the clinic by self.  For the last month, he reports having achy pain over the medial thigh, described as achy and sore.  There is no associated tingling, numbness, or weakness.  However, when pain is severe, he feels that his right knee can buckle.    RLS and neuropathy are well-controlled on his current medication regimen.   Medications:  Current Outpatient Medications on File Prior to Visit  Medication Sig Dispense Refill  . aspirin 81 MG EC tablet Take 1 tablet (81 mg total) by mouth daily. 100 tablet 3  . carbidopa-levodopa (SINEMET IR) 25-100 MG tablet For breakthrough restless leg, you can take 1 tablet at bedtime.  OK to take extra tab as needed during the day. 100 tablet 3  . Cholecalciferol 1000 UNITS tablet Take 3,000 Units by mouth daily.     . Cyanocobalamin (VITAMIN B-12) 1000 MCG SUBL DISSOLVE 2 TABLETS IN MOUTH EVERY DAY 60 tablet 8  . finasteride (PROSCAR) 5 MG tablet TAKE 1 TABLET BY MOUTH EVERY DAY 90 tablet 3  . gabapentin (NEURONTIN) 300 MG capsule TAKE 1 CAPSULE (300 MG TOTAL) BY MOUTH 4 (FOUR) TIMES DAILY. 360 capsule 1  . HYDROcodone-acetaminophen (NORCO) 7.5-325 MG tablet Take 1 tablet by mouth every 6 (six) hours as needed for severe pain. 120 tablet 0  . HYDROcodone-acetaminophen (NORCO) 7.5-325 MG tablet Take 1 tablet by mouth every 6 (six) hours as needed for severe pain. 120 tablet 0  . HYDROcodone-acetaminophen (NORCO) 7.5-325 MG tablet Take 1 tablet by mouth every 6 (six) hours as needed for severe pain. Please fill on or after 05/17/20 60 tablet 0  . meclizine (ANTIVERT) 12.5 MG tablet TAKE 1 TABLET (12.5 MG TOTAL)  BY MOUTH 3 (THREE) TIMES DAILY AS NEEDED FOR DIZZINESS. 60 tablet 1  . Multiple Vitamin (MULTIVITAMIN) capsule Take 1 capsule by mouth daily.      . Multiple Vitamins-Minerals (PRESERVISION AREDS PO) Take 2 tablets by mouth 2 (two) times daily.    . pravastatin (PRAVACHOL) 20 MG tablet TAKE 1 TABLET BY MOUTH EVERY DAY 90 tablet 1  . rOPINIRole (REQUIP) 2 MG tablet TAKE 1 TABLET BY MOUTH AT 12PM, 1 TABLET AT 4PM AND 1 TABLETS AT BEDTIME 360 tablet 1  . tamsulosin (FLOMAX) 0.4 MG CAPS capsule TAKE 1 CAPSULE BY MOUTH EVERY DAY 90 capsule 3  . terazosin (HYTRIN) 2 MG capsule TAKE 1 CAPSULE BY MOUTH EVERYDAY AT BEDTIME 90 capsule 3  . triamcinolone cream (KENALOG) 0.1 % Apply 1 application topically 2 (two) times daily. 100 g 0  . furosemide (LASIX) 20 MG tablet Take 1-2 tablets (20-40 mg total) by mouth daily. (Patient not taking: Reported on 05/24/2020) 60 tablet 3   No current facility-administered medications on file prior to visit.    Allergies:  Allergies  Allergen Reactions  . Levofloxacin     REACTION: hands pealed    Vital Signs:  BP 139/84   Pulse 74   Ht 6' (1.829 m)   Wt 203 lb (92.1 kg)   SpO2 90%   BMI 27.53 kg/m    Neurological Exam: MENTAL STATUS including orientation to time,  place, person, recent and remote memory, attention span and concentration, language, and fund of knowledge is normal.  Speech is not dysarthric.  CRANIAL NERVES:  No visual field defects. Normal conjugate, extra-ocular eye movements in all directions of gaze.  No ptosis  MOTOR:  Motor strength is 5/5 in all extremities, except left hand with prominent weakness in ulnar -innervated muscles.  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  He has pain with palpation over the distal adductor muscles, strength is 5/5  MSRs:  Reflexes are 2+/4 throughout, except 1+/4 distally in the feet.  SENSORY:  Absent vibration at the feet, intact at the knees.   COORDINATION/GAIT:  Gait slow,  mildly wide-based and stable.   Data: n/a  IMPRESSION/PLAN: 1.  Right medial thigh pain - new.  No evidence of lumbar radiculopathy, discussed that pain is musculoskeletal  - Upon further discussion, he has seen Dr. Tamala Julian for this in the past who he will follow-up with  2.  Peripheral neuropathy, stable  - Continue gabapentin 300mg  in the afternoon, 300mg  at 4pm, and 600mg  at bedtime  3.  Restless leg syndrome  - Continue ropinirole 2mg  at noon, 2mg  at 4pm and 4mg  at bedtime  - OK to take sinemet prn   Return to clinic in 5 months.   Thank you for allowing me to participate in patient's care.  If I can answer any additional questions, I would be pleased to do so.    Sincerely,    Donika K. Posey Pronto, DO

## 2020-05-24 NOTE — Patient Instructions (Signed)
Follow-up with Dr. Smith

## 2020-05-31 ENCOUNTER — Encounter: Payer: Self-pay | Admitting: Internal Medicine

## 2020-05-31 ENCOUNTER — Other Ambulatory Visit: Payer: Self-pay

## 2020-05-31 ENCOUNTER — Ambulatory Visit (INDEPENDENT_AMBULATORY_CARE_PROVIDER_SITE_OTHER): Payer: PPO

## 2020-05-31 ENCOUNTER — Ambulatory Visit (INDEPENDENT_AMBULATORY_CARE_PROVIDER_SITE_OTHER): Payer: PPO | Admitting: Internal Medicine

## 2020-05-31 VITALS — BP 130/62 | HR 70 | Temp 98.0°F | Wt 206.2 lb

## 2020-05-31 DIAGNOSIS — I10 Essential (primary) hypertension: Secondary | ICD-10-CM | POA: Diagnosis not present

## 2020-05-31 DIAGNOSIS — M79604 Pain in right leg: Secondary | ICD-10-CM | POA: Diagnosis not present

## 2020-05-31 DIAGNOSIS — E538 Deficiency of other specified B group vitamins: Secondary | ICD-10-CM

## 2020-05-31 DIAGNOSIS — M25561 Pain in right knee: Secondary | ICD-10-CM | POA: Insufficient documentation

## 2020-05-31 DIAGNOSIS — I2510 Atherosclerotic heart disease of native coronary artery without angina pectoris: Secondary | ICD-10-CM | POA: Diagnosis not present

## 2020-05-31 DIAGNOSIS — M545 Low back pain, unspecified: Secondary | ICD-10-CM

## 2020-05-31 DIAGNOSIS — G8929 Other chronic pain: Secondary | ICD-10-CM

## 2020-05-31 DIAGNOSIS — E559 Vitamin D deficiency, unspecified: Secondary | ICD-10-CM | POA: Diagnosis not present

## 2020-05-31 DIAGNOSIS — M25522 Pain in left elbow: Secondary | ICD-10-CM | POA: Insufficient documentation

## 2020-05-31 DIAGNOSIS — G629 Polyneuropathy, unspecified: Secondary | ICD-10-CM

## 2020-05-31 MED ORDER — HYDROCODONE-ACETAMINOPHEN 7.5-325 MG PO TABS
1.0000 | ORAL_TABLET | Freq: Four times a day (QID) | ORAL | 0 refills | Status: DC | PRN
Start: 2020-05-31 — End: 2020-07-06

## 2020-05-31 MED ORDER — HYDROCODONE-ACETAMINOPHEN 7.5-325 MG PO TABS: 1.0000 | ORAL_TABLET | Freq: Four times a day (QID) | ORAL | 0 refills | Status: DC | PRN

## 2020-05-31 MED ORDER — METHYLPREDNISOLONE 4 MG PO TBPK: ORAL_TABLET | ORAL | 0 refills | Status: DC

## 2020-05-31 MED ORDER — HYDROCODONE-ACETAMINOPHEN 7.5-325 MG PO TABS
1.0000 | ORAL_TABLET | Freq: Four times a day (QID) | ORAL | 0 refills | Status: DC | PRN
Start: 2020-05-31 — End: 2020-07-09

## 2020-05-31 NOTE — Assessment & Plan Note (Signed)
X ray Painful osteophyte Dr Amedeo Plenty

## 2020-05-31 NOTE — Assessment & Plan Note (Signed)
Vit D 

## 2020-05-31 NOTE — Progress Notes (Signed)
Established Patient Office Visit  Subjective:  Patient ID: Justin Miron Su Grand., male    DOB: August 05, 1940  Age: 79 y.o. MRN: [id number]  CC:  Chief Complaint  Patient presents with  . Follow-up    HPI Justin Erny [employer]. presents for chronic pain, RLS, dyslipidemia f/u C/o R leg pain x 1 month; LBP    Past Medical History:  Diagnosis Date  . Alcoholism ([place])    Dry 13 years  . Basal cell carcinoma    Right Chest  . Chronic insomnia    On ambien x 2 years  . Depression   . Diverticulosis of colon   . Dizzy spells   . Elbow fracture, left 2009   [name]  . Glaucoma   . History of TIAs   . Hyperkeratosis   . LBP (low back pain)   . Melanoma [facility]) 2009   x3 [name]  . Osteoarthritis   . Restless leg syndrome   . Tinnitus   . Tobacco abuse     Past Surgical History:  Procedure Laterality Date  . CATARACT EXTRACTION    . mda     Dr. [name] wet injection  . MELANOMA EXCISION  2009   x3  . RECONSTRUCTION MEDIAL COLLATERAL LIGAMENT ELBOW W/ TENDON GRAFT  2009   Left, [name]    Family History  Problem Relation Age of Onset  . Cancer Mother        ?breast  . Cancer Father        Lung    Social History   Socioeconomic History  . Marital status: Married    Spouse name: Not on file  . Number of children: 2  . Years of education: Not on file  . Highest education level: Not on file  Occupational History  . Occupation: [place] [employer]    Employer: RETIRED  . Occupation: Chiropodist  . Occupation: Heritage manager  Tobacco Use  . Smoking status: Former Smoker    Packs/day: 3.00    Years: 38.00    Pack years: 114.00    Types: Cigarettes    Quit date: 08/30/1990    Years since quitting: 29.7  . Smokeless tobacco: Never Used  . Tobacco comment: states he quit 1986  Vaping Use  . Vaping Use: Never used  Substance and Sexual Activity  . Alcohol use: No    Alcohol/week: 0.0 standard drinks    Comment: PREVIOUS - DRY  13 yrs  . Drug use: Not Currently  . Sexual activity: Not Currently  Other Topics Concern  . Not on file  Social History Narrative   Right handed   One story home   Drinks coffee q am   Social Determinants of Health   Financial Resource Strain:   . Difficulty of Paying Living Expenses: Not on file  Food Insecurity:   . Worried About Charity fundraiser in the Last Year: Not on file  . Ran Out of Food in the Last Year: Not on file  Transportation Needs:   . Lack of Transportation (Medical): Not on file  . Lack of Transportation (Non-Medical): Not on file  Physical Activity:   . Days of Exercise per Week: Not on file  . Minutes of Exercise per Session: Not on file  Stress:   . Feeling of Stress : Not on file  Social Connections:   . Frequency of Communication with Friends and Family: Not on file  . Frequency  of Social Gatherings with Friends and Family: Not on file  . Attends Religious Services: Not on file  . Active Member of Clubs or Organizations: Not on file  . Attends Archivist Meetings: Not on file  . Marital Status: Not on file  Intimate Partner Violence:   . Fear of Current or Ex-Partner: Not on file  . Emotionally Abused: Not on file  . Physically Abused: Not on file  . Sexually Abused: Not on file    Outpatient Medications Prior to Visit  Medication Sig Dispense Refill  . aspirin 81 MG EC tablet Take 1 tablet (81 mg total) by mouth daily. 100 tablet 3  . carbidopa-levodopa (SINEMET IR) 25-100 MG tablet For breakthrough restless leg, you can take 1 tablet at bedtime.  OK to take extra tab as needed during the day. 100 tablet 3  . Cholecalciferol 1000 UNITS tablet Take 3,000 Units by mouth daily.     . Cyanocobalamin (VITAMIN B-12) 1000 MCG SUBL DISSOLVE 2 TABLETS IN MOUTH EVERY DAY 60 tablet 8  . finasteride (PROSCAR) 5 MG tablet TAKE 1 TABLET BY MOUTH EVERY DAY 90 tablet 3  . furosemide (LASIX) 20 MG tablet Take 1-2 tablets (20-40 mg total) by mouth  daily. 60 tablet 3  . gabapentin (NEURONTIN) 300 MG capsule TAKE 1 CAPSULE (300 MG TOTAL) BY MOUTH 4 (FOUR) TIMES DAILY. 360 capsule 1  . meclizine (ANTIVERT) 12.5 MG tablet TAKE 1 TABLET (12.5 MG TOTAL) BY MOUTH 3 (THREE) TIMES DAILY AS NEEDED FOR DIZZINESS. 60 tablet 1  . Multiple Vitamin (MULTIVITAMIN) capsule Take 1 capsule by mouth daily.      . Multiple Vitamins-Minerals (PRESERVISION AREDS PO) Take 2 tablets by mouth 2 (two) times daily.    . pravastatin (PRAVACHOL) 20 MG tablet TAKE 1 TABLET BY MOUTH EVERY DAY 90 tablet 1  . rOPINIRole (REQUIP) 2 MG tablet TAKE 1 TABLET BY MOUTH AT 12PM, 1 TABLET AT 4PM AND 1 TABLETS AT BEDTIME 360 tablet 1  . tamsulosin (FLOMAX) 0.4 MG CAPS capsule TAKE 1 CAPSULE BY MOUTH EVERY DAY 90 capsule 3  . terazosin (HYTRIN) 2 MG capsule TAKE 1 CAPSULE BY MOUTH EVERYDAY AT BEDTIME 90 capsule 3  . triamcinolone cream (KENALOG) 0.1 % Apply 1 application topically 2 (two) times daily. 100 g 0  . HYDROcodone-acetaminophen (NORCO) 7.5-325 MG tablet Take 1 tablet by mouth every 6 (six) hours as needed for severe pain. 120 tablet 0  . HYDROcodone-acetaminophen (NORCO) 7.5-325 MG tablet Take 1 tablet by mouth every 6 (six) hours as needed for severe pain. 120 tablet 0  . HYDROcodone-acetaminophen (NORCO) 7.5-325 MG tablet Take 1 tablet by mouth every 6 (six) hours as needed for severe pain. Please fill on or after 05/17/20 60 tablet 0   No facility-administered medications prior to visit.    Allergies  Allergen Reactions  . Levofloxacin     REACTION: hands pealed    ROS Review of Systems  Constitutional: Negative for appetite change, fatigue and unexpected weight change.  HENT: Negative for congestion, nosebleeds, sneezing, sore throat and trouble swallowing.   Eyes: Negative for itching and visual disturbance.  Respiratory: Negative for cough.   Cardiovascular: Negative for chest pain, palpitations and leg swelling.  Gastrointestinal: Negative for abdominal  distention, blood in stool, diarrhea and nausea.  Genitourinary: Negative for frequency and hematuria.  Musculoskeletal: Positive for arthralgias, back pain and gait problem. Negative for joint swelling and neck pain.  Skin: Negative for rash.  Neurological: Negative for  dizziness, tremors, speech difficulty and weakness.  Psychiatric/Behavioral: Negative for agitation, dysphoric mood and sleep disturbance. The patient is not nervous/anxious.       Objective:    Physical Exam Constitutional:      General: He is not in acute distress.    Appearance: He is well-developed.     Comments: NAD  Eyes:     Conjunctiva/sclera: Conjunctivae normal.     Pupils: Pupils are equal, round, and reactive to light.  Neck:     Thyroid: No thyromegaly.     Vascular: No JVD.  Cardiovascular:     Rate and Rhythm: Normal rate and regular rhythm.     Heart sounds: Normal heart sounds. No murmur heard.  No friction rub. No gallop.   Pulmonary:     Effort: Pulmonary effort is normal. No respiratory distress.     Breath sounds: Normal breath sounds. No wheezing or rales.  Chest:     Chest wall: No tenderness.  Abdominal:     General: Bowel sounds are normal. There is no distension.     Palpations: Abdomen is soft. There is no mass.     Tenderness: There is no abdominal tenderness. There is no guarding or rebound.  Musculoskeletal:        General: Tenderness present. Normal range of motion.     Cervical back: Normal range of motion.  Lymphadenopathy:     Cervical: No cervical adenopathy.  Skin:    General: Skin is warm and dry.     Findings: No rash.  Neurological:     Mental Status: He is alert and oriented to person, place, and time.     Cranial Nerves: No cranial nerve deficit.     Motor: No abnormal muscle tone.     Coordination: Coordination normal.     Gait: Gait abnormal.     Deep Tendon Reflexes: Reflexes are normal and symmetric.  Psychiatric:        Behavior: Behavior normal.         Thought Content: Thought content normal.        Judgment: Judgment normal.   LS tender L lat elbow w/pain and an ?osteophyte  BP 130/62 (BP Location: Right Arm, Patient Position: Sitting, Cuff Size: Normal)   Pulse 70   Temp 98 F (36.7 C) (Oral)   Wt 206 lb 3.2 oz (93.5 kg)   SpO2 96%   BMI 27.97 kg/m  Wt Readings from Last 3 Encounters:  05/31/20 206 lb 3.2 oz (93.5 kg)  05/24/20 203 lb (92.1 kg)  03/22/20 205 lb (93 kg)      Lab Results  Component Value Date   TSH 3.05 10/16/2019   Lab Results  Component Value Date   WBC 5.1 10/16/2019   HGB 10.3 (L) 10/16/2019   HCT 30.0 (L) 10/16/2019   MCV 91.5 10/16/2019   PLT 160.0 10/16/2019   Lab Results  Component Value Date   NA 139 10/27/2019   K 4.4 10/27/2019   CO2 28 10/27/2019   GLUCOSE 94 10/27/2019   BUN 24 (H) 10/27/2019   CREATININE 1.21 10/27/2019   BILITOT 0.4 10/16/2019   ALKPHOS 58 10/16/2019   AST 21 10/16/2019   ALT 13 10/16/2019   PROT 6.5 10/16/2019   ALBUMIN 4.1 10/16/2019   CALCIUM 9.2 10/27/2019   GFR 57.63 (L) 10/27/2019   Lab Results  Component Value Date   CHOL 136 01/12/2017   Lab Results  Component Value Date   HDL 54.70 01/12/2017  Lab Results  Component Value Date   LDLCALC 68 01/12/2017   Lab Results  Component Value Date   TRIG 67.0 01/12/2017   Lab Results  Component Value Date   CHOLHDL 2 01/12/2017   Lab Results  Component Value Date   HGBA1C 5.6 07/28/2015      Assessment & Plan:   Problem List Items Addressed This Visit    Neuropathy   Relevant Medications   HYDROcodone-acetaminophen (NORCO) 7.5-325 MG tablet   Low back pain - Primary   Relevant Medications   HYDROcodone-acetaminophen (NORCO) 7.5-325 MG tablet   HYDROcodone-acetaminophen (NORCO) 7.5-325 MG tablet   HYDROcodone-acetaminophen (NORCO) 7.5-325 MG tablet   methylPREDNISolone (MEDROL DOSEPAK) 4 MG TBPK tablet   Other Relevant Orders   DG Lumbar Spine 2-3 Views (Completed)   Coronary  artery disease    Pravastatin, baby aspirin      Elbow pain, chronic, left    X ray Painful osteophyte Dr Amedeo Plenty      Relevant Medications   HYDROcodone-acetaminophen (NORCO) 7.5-325 MG tablet   HYDROcodone-acetaminophen (NORCO) 7.5-325 MG tablet   HYDROcodone-acetaminophen (NORCO) 7.5-325 MG tablet   methylPREDNISolone (MEDROL DOSEPAK) 4 MG TBPK tablet   HTN (hypertension)    Hytrin      Leg pain, anterior, right    ?sciatica R      Relevant Orders   DG Lumbar Spine 2-3 Views (Completed)   Vitamin B12 deficiency    On B12      Vitamin D deficiency    Vit D         Meds ordered this encounter  Medications  . HYDROcodone-acetaminophen (NORCO) 7.5-325 MG tablet    Sig: Take 1 tablet by mouth every 6 (six) hours as needed for severe pain.    Dispense:  120 tablet    Refill:  0    Please fill on or after 07/30/20  . HYDROcodone-acetaminophen (NORCO) 7.5-325 MG tablet    Sig: Take 1 tablet by mouth every 6 (six) hours as needed for severe pain.    Dispense:  120 tablet    Refill:  0    Please fill on or after 06/30/20  . HYDROcodone-acetaminophen (NORCO) 7.5-325 MG tablet    Sig: Take 1 tablet by mouth every 6 (six) hours as needed for severe pain. Please fill on or after 05/31/20    Dispense:  120 tablet    Refill:  0  . methylPREDNISolone (MEDROL DOSEPAK) 4 MG TBPK tablet    Sig: As directed    Dispense:  21 tablet    Refill:  0    Follow-up: Return in about 3 months (around 08/30/2020) for a follow-up visit.    [name], MD

## 2020-05-31 NOTE — Assessment & Plan Note (Signed)
Pravastatin, baby aspirin 

## 2020-05-31 NOTE — Assessment & Plan Note (Signed)
On B12 

## 2020-05-31 NOTE — Assessment & Plan Note (Signed)
Hytrin 

## 2020-05-31 NOTE — Assessment & Plan Note (Signed)
?  sciatica R

## 2020-06-01 ENCOUNTER — Other Ambulatory Visit: Payer: Self-pay | Admitting: Internal Medicine

## 2020-06-01 DIAGNOSIS — D2371 Other benign neoplasm of skin of right lower limb, including hip: Secondary | ICD-10-CM | POA: Diagnosis not present

## 2020-06-01 DIAGNOSIS — L814 Other melanin hyperpigmentation: Secondary | ICD-10-CM | POA: Diagnosis not present

## 2020-06-01 DIAGNOSIS — I8311 Varicose veins of right lower extremity with inflammation: Secondary | ICD-10-CM | POA: Diagnosis not present

## 2020-06-01 DIAGNOSIS — I8312 Varicose veins of left lower extremity with inflammation: Secondary | ICD-10-CM | POA: Diagnosis not present

## 2020-06-01 DIAGNOSIS — Z85828 Personal history of other malignant neoplasm of skin: Secondary | ICD-10-CM | POA: Diagnosis not present

## 2020-06-01 DIAGNOSIS — D2271 Melanocytic nevi of right lower limb, including hip: Secondary | ICD-10-CM | POA: Diagnosis not present

## 2020-06-01 DIAGNOSIS — I872 Venous insufficiency (chronic) (peripheral): Secondary | ICD-10-CM | POA: Diagnosis not present

## 2020-06-01 DIAGNOSIS — L570 Actinic keratosis: Secondary | ICD-10-CM | POA: Diagnosis not present

## 2020-06-01 DIAGNOSIS — D1801 Hemangioma of skin and subcutaneous tissue: Secondary | ICD-10-CM | POA: Diagnosis not present

## 2020-06-01 DIAGNOSIS — D225 Melanocytic nevi of trunk: Secondary | ICD-10-CM | POA: Diagnosis not present

## 2020-06-10 DIAGNOSIS — M25561 Pain in right knee: Secondary | ICD-10-CM | POA: Diagnosis not present

## 2020-06-10 DIAGNOSIS — T849XXA Unspecified complication of internal orthopedic prosthetic device, implant and graft, initial encounter: Secondary | ICD-10-CM | POA: Insufficient documentation

## 2020-06-10 DIAGNOSIS — M545 Low back pain, unspecified: Secondary | ICD-10-CM | POA: Diagnosis not present

## 2020-06-10 DIAGNOSIS — M25522 Pain in left elbow: Secondary | ICD-10-CM | POA: Diagnosis not present

## 2020-06-10 DIAGNOSIS — T8484XA Pain due to internal orthopedic prosthetic devices, implants and grafts, initial encounter: Secondary | ICD-10-CM | POA: Diagnosis not present

## 2020-06-10 DIAGNOSIS — M25551 Pain in right hip: Secondary | ICD-10-CM | POA: Diagnosis not present

## 2020-06-14 ENCOUNTER — Ambulatory Visit (INDEPENDENT_AMBULATORY_CARE_PROVIDER_SITE_OTHER): Payer: PPO

## 2020-06-14 DIAGNOSIS — Z0000 Encounter for general adult medical examination without abnormal findings: Secondary | ICD-10-CM

## 2020-06-14 NOTE — Progress Notes (Addendum)
I connected with Virl Cagey today by telephone and verified that I am speaking with the correct person using two identifiers. Location patient: home Location provider: work Persons participating in the virtual visit: [employer] and [place], [place].   I discussed the limitations, risks, security and privacy concerns of performing an evaluation and management service by telephone and the availability of in person appointments. I also discussed with the patient that there may be a patient responsible charge related to this service. The patient expressed understanding and verbally consented to this telephonic visit.    Interactive audio and video telecommunications were attempted between this provider and patient, however failed, due to patient having technical difficulties OR patient did not have access to video capability.  We continued and completed visit with audio only.  Some vital signs may be absent or patient reported.   Time Spent with patient on telephone encounter: 30 minutes  Subjective:   Justin Dave. is a 79 y.o. male who presents for Medicare Annual/Subsequent preventive examination.  Review of Systems    No ROS. Medicare Wellness Visit. Additional risk factors are reflected in social history. Cardiac Risk Factors include: advanced age (>53men, >76 women);dyslipidemia;hypertension;male gender     Objective:    Today's Vitals   06/14/20 1051  PainSc: 10-Worst pain ever   There is no height or weight on file to calculate BMI.  Advanced Directives 06/14/2020 05/24/2020 03/22/2020 10/15/2019 06/12/2019 05/14/2018 05/03/2017  Does Patient Have a Medical Advance Directive? No No No No No No No  Does patient want to make changes to medical advance directive? - - - - No - Patient declined No - Patient declined -  Would patient like information on creating a medical advance directive? No - Patient declined - - - - - Yes (ED - Information included in  AVS)    Current Medications (verified) Outpatient Encounter Medications as of 06/14/2020  Medication Sig   aspirin 81 MG EC tablet Take 1 tablet (81 mg total) by mouth daily.   carbidopa-levodopa (SINEMET IR) 25-100 MG tablet For breakthrough restless leg, you can take 1 tablet at bedtime.  OK to take extra tab as needed during the day.   Cholecalciferol 1000 UNITS tablet Take 3,000 Units by mouth daily.    Cyanocobalamin (VITAMIN B-12) 1000 MCG SUBL DISSOLVE 2 TABLETS IN MOUTH EVERY DAY   finasteride (PROSCAR) 5 MG tablet TAKE 1 TABLET BY MOUTH EVERY DAY   furosemide (LASIX) 20 MG tablet Take 1-2 tablets (20-40 mg total) by mouth daily.   gabapentin (NEURONTIN) 300 MG capsule TAKE 1 CAPSULE (300 MG TOTAL) BY MOUTH 4 (FOUR) TIMES DAILY.   HYDROcodone-acetaminophen (NORCO) 7.5-325 MG tablet Take 1 tablet by mouth every 6 (six) hours as needed for severe pain.   HYDROcodone-acetaminophen (NORCO) 7.5-325 MG tablet Take 1 tablet by mouth every 6 (six) hours as needed for severe pain.   HYDROcodone-acetaminophen (NORCO) 7.5-325 MG tablet Take 1 tablet by mouth every 6 (six) hours as needed for severe pain. Please fill on or after 05/31/20   meclizine (ANTIVERT) 12.5 MG tablet TAKE 1 TABLET (12.5 MG TOTAL) BY MOUTH 3 (THREE) TIMES DAILY AS NEEDED FOR DIZZINESS.   methylPREDNISolone (MEDROL DOSEPAK) 4 MG TBPK tablet As directed   Multiple Vitamin (MULTIVITAMIN) capsule Take 1 capsule by mouth daily.     Multiple Vitamins-Minerals (PRESERVISION AREDS PO) Take 2 tablets by mouth 2 (two) times daily.   pravastatin (PRAVACHOL) 20 MG tablet TAKE 1 TABLET BY MOUTH  EVERY DAY   rOPINIRole (REQUIP) 2 MG tablet TAKE 1 TABLET BY MOUTH AT 12PM, 1 TABLET AT 4PM AND 1 TABLETS AT BEDTIME   tamsulosin (FLOMAX) 0.4 MG CAPS capsule TAKE 1 CAPSULE BY MOUTH EVERY DAY   terazosin (HYTRIN) 2 MG capsule TAKE 1 CAPSULE BY MOUTH EVERYDAY AT BEDTIME   triamcinolone cream (KENALOG) 0.1 % Apply 1 application topically 2 (two)  times daily.   No facility-administered encounter medications on file as of 06/14/2020.    Allergies (verified) Levofloxacin   History: Past Medical History:  Diagnosis Date   Alcoholism ([place])    Dry 13 years   Basal cell carcinoma    Right Chest   Chronic insomnia    On ambien x 2 years   Depression    Diverticulosis of colon    Dizzy spells    Elbow fracture, left 2009   [name]   Glaucoma    History of TIAs    Hyperkeratosis    LBP (low back pain)    Melanoma ([place]) 2009   x3 [name]   Osteoarthritis    Restless leg syndrome    Tinnitus    Tobacco abuse    Past Surgical History:  Procedure Laterality Date   CATARACT EXTRACTION     mda     Dr. [name] wet injection   MELANOMA EXCISION  2009   x3   RECONSTRUCTION MEDIAL COLLATERAL LIGAMENT ELBOW W/ TENDON GRAFT  2009   Left, [name]   Family History  Problem Relation Age of Onset   Cancer Mother        ?breast   Cancer Father        Lung   Social History   Socioeconomic History   Marital status: Married    Spouse name: Not on file   Number of children: 2   Years of education: Not on file   Highest education level: Not on file  Occupational History   Occupation: [place] Veteran - [employer]    Employer: RETIRED   Occupation: Chiropodist   Occupation: Heritage manager  Tobacco Use   Smoking status: Former Smoker    Packs/day: 3.00    Years: 38.00    Pack years: 114.00    Types: Cigarettes    Quit date: 08/30/1990    Years since quitting: 29.8   Smokeless tobacco: Never Used   Tobacco comment: states he quit 1986  Vaping Use   Vaping Use: Never used  Substance and Sexual Activity   Alcohol use: No    Alcohol/week: 0.0 standard drinks    Comment: PREVIOUS - DRY 13 yrs   Drug use: Not Currently   Sexual activity: Not Currently  Other Topics Concern   Not on file  Social History Narrative   Right handed   One story home   Drinks coffee q am   Social Determinants of  Health   Financial Resource Strain: Low Risk    Difficulty of Paying Living Expenses: Not hard at all  Food Insecurity: No Food Insecurity   Worried About Charity fundraiser in the Last Year: Never true   [place] in the Last Year: Never true  Transportation Needs: No Transportation Needs   Lack of Transportation (Medical): No   Lack of Transportation (Non-Medical): No  Physical Activity: Inactive   Days of Exercise per Week: 0 days   Minutes of Exercise per Session: 0 min  Stress: No Stress Concern Present  Feeling of Stress : Not at all  Social Connections: Socially Integrated   Frequency of Communication with Friends and Family: More than three times a week   Frequency of Social Gatherings with Friends and Family: Once a week   Attends Religious Services: More than 4 times per year   Active Member of [employer] or Organizations: No   Attends Music therapist: More than 4 times per year   Marital Status: Married    Tobacco Counseling Counseling given: Not Answered Comment: states he quit 1986   Clinical Intake:  Pre-visit preparation completed: Yes  Pain : 0-10 Pain Score: 10-Worst pain ever Pain Type: Chronic pain Pain Location: Back Pain Orientation: Lower Pain Radiating Towards: Right lower extremilty Pain Descriptors / Indicators: Constant Pain Onset: More than a month ago Pain Frequency: Constant Pain Relieving Factors: Pain medication and injections for [facility] Effect of Pain on Daily Activities: Pain can diminish job performance, lower motivation to exercise, and prevent you from completing daily tasks. Pain produces disability and affects the quality of life.  Pain Relieving Factors: Pain medication and injections for [facility]  Nutritional Risks: None Diabetes: No  How often do you need to have someone help you when you read instructions, pamphlets, or other written materials from your doctor or pharmacy?: 1 - Never What is the last  grade level you completed in school?: Bachelor's Degree  Diabetic? no  Interpreter Needed?: No  Information entered by :: Lisette Abu, LPN   Activities of Daily Living In your present state of health, do you have any difficulty performing the following activities: 06/14/2020  Hearing? N  Vision? N  Difficulty concentrating or making decisions? N  Walking or climbing stairs? Y  Comment due to low back pain radiating to right leg  Dressing or bathing? N  Doing errands, shopping? N  Preparing Food and eating ? N  Using the Toilet? N  In the past six months, have you accidently leaked urine? N  Do you have problems with loss of bowel control? N  Managing your Medications? N  Managing your Finances? N  Housekeeping or managing your Housekeeping? N  Some recent data might be hidden    Patient Care Team: Plotnikov, Evie Lacks, MD as PCP - General Carol Ada, MD as Consulting Physician (Gastroenterology) Martinique, Amy, MD as Consulting Physician (Dermatology) Alda Berthold, DO as Consulting Physician (Neurology)  Indicate any recent Medical Services you may have received from other than [facility] providers in the past year (date may be approximate).     Assessment:   This is a routine wellness examination for Mariano Colan.  Hearing/Vision screen No exam data present  Dietary issues and exercise activities discussed: Current Exercise Habits: The patient does not participate in regular exercise at present, Exercise limited by: orthopedic condition(s);cardiac condition(s)  Goals       Continue to be as active and as independent as possible      patient (pt-stated)      Wants to stay healthy;  Be with grand-children      Patient Stated      Stay healthy and independent        Depression Screen PHQ 2/9 Scores 06/14/2020 06/12/2019 05/14/2018 05/03/2017 11/30/2016 07/30/2015 10/21/2014  PHQ - 2 Score 0 0 1 2 0 0 0  PHQ- 9 Score - 2 4 3 7  - -    Fall Risk Fall Risk   06/14/2020 05/24/2020 03/22/2020 10/15/2019 06/12/2019  Falls in the past year? 0 0 0  0 0  Number falls in past yr: 0 0 0 - 0  Injury with Fall? 0 0 0 - 0  Risk for fall due to : No Fall Risks - - - -  Follow up Falls evaluation completed - - - Falls prevention discussed    FALL RISK PREVENTION PERTAINING TO THE HOME:  Any stairs in or around the home? No  If so, are there any without handrails? No  Home free of loose throw rugs in walkways, pet beds, electrical cords, etc? Yes  Adequate lighting in your home to reduce risk of falls? Yes   ASSISTIVE DEVICES UTILIZED TO PREVENT FALLS:  Life alert? No  Use of a cane, walker or w/c? Yes  Grab bars in the bathroom? No  Shower chair or bench in shower? No  Elevated toilet seat or a handicapped toilet? No   TIMED UP AND GO:  Was the test performed? No .  Length of time to ambulate 10 feet: 0 sec.   Gait steady and fast with assistive device  Cognitive Function: MMSE - Mini Mental State Exam 05/03/2017  Orientation to time 5  Orientation to Place 5  Registration 3  Attention/ Calculation 5  Recall 2  Language- name 2 objects 2  Language- repeat 1  Language- follow 3 step command 3  Language- read & follow direction 1  Write a sentence 1  Copy design 1  Total score 29        Immunizations Immunization History  Administered Date(s) Administered   Fluad Quad(high Dose 65+) 03/19/2019, 03/15/2020   Influenza Split 03/30/2011, 05/09/2012   Influenza Whole 04/22/2008, 03/31/2009   Influenza, High Dose Seasonal PF 05/07/2013, 03/23/2016, 03/27/2017, 04/03/2018   Influenza,inj,Quad PF,6+ Mos 04/07/2014, 04/29/2015   PFIZER SARS-COV-2 Vaccination 08/07/2019, 09/01/2019   Pneumococcal Conjugate-13 06/13/2013   Pneumococcal Polysaccharide-23 10/27/2009   Td 06/23/2010    TDAP status: Up to date  Flu Vaccine status: Up to date  Pneumococcal vaccine status: Up to date  Covid-19 vaccine status: Completed  vaccines  Qualifies for Shingles Vaccine? Yes   Zostavax completed No   Shingrix Completed?: No.    Education has been provided regarding the importance of this vaccine. Patient has been advised to call insurance company to determine out of pocket expense if they have not yet received this vaccine. Advised may also receive vaccine at local pharmacy or Health Dept. Verbalized acceptance and understanding.  Screening Tests Health Maintenance  Topic Date Due   COVID-19 Vaccine (3 - Booster for Pfizer series) 03/03/2020   TETANUS/TDAP  06/23/2020   INFLUENZA VACCINE  Completed   PNA vac Low Risk Adult  Completed    Health Maintenance  Health Maintenance Due  Topic Date Due   COVID-19 Vaccine (3 - Booster for Pfizer series) 03/03/2020    Colorectal cancer screening: No longer required.   Lung Cancer Screening: (Low Dose CT Chest recommended if Age 101-80 years, 30 pack-year currently smoking OR have quit w/in 15years.) does not qualify.   Lung Cancer Screening Referral: no  Additional Screening:  Hepatitis C Screening: does not qualify; Completed no  Vision Screening: Recommended annual ophthalmology exams for early detection of glaucoma and other disorders of the eye. Is the patient up to date with their annual eye exam?  Yes  Who is the provider or what is the name of the office in which the patient attends annual eye exams? [name], MD. If pt is not established with a provider, would they like to  be referred to a provider to establish care? No .   Dental Screening: Recommended annual dental exams for proper oral hygiene  Community Resource Referral / Chronic Care Management: CRR required this visit?  No   CCM required this visit?  No      Plan:     I have personally reviewed and noted the following in the patient's chart:   Medical and social history Use of alcohol, tobacco or illicit drugs  Current medications and supplements Functional ability and  status Nutritional status Physical activity Advanced directives List of other physicians Hospitalizations, surgeries, and ER visits in previous 12 months Vitals Screenings to include cognitive, depression, and falls Referrals and appointments  In addition, I have reviewed and discussed with patient certain preventive protocols, quality metrics, and best practice recommendations. A written personalized care plan for preventive services as well as general preventive health recommendations were provided to patient.     Sheral Flow, LPN   [id number]   Nurse Notes:  Patient is cogitatively intact. There were no vitals filed for this visit. There is no height or weight on file to calculate BMI.  Medical screening examination/treatment/procedure(s) were performed by non-physician practitioner and as supervising physician I was immediately available for consultation/collaboration.  I agree with above. [name], MD

## 2020-06-14 NOTE — Patient Instructions (Signed)
Justin [name] , Thank you for taking time to come for your Medicare Wellness Visit. I appreciate your ongoing commitment to your health goals. Please review the following plan we discussed and let me know if I can assist you in the future.   Screening recommendations/referrals: Colonoscopy: no repeat due to age Recommended yearly ophthalmology/optometry visit for glaucoma screening and checkup Recommended yearly dental visit for hygiene and checkup  Vaccinations: Influenza vaccine: 03/15/2020 Pneumococcal vaccine: up to date Tdap vaccine: 06/23/2010; due every 10 years Shingles vaccine: never done   Covid-19: up to date  Advanced directives: Advance directive discussed with you today. Even though you declined this today please call our office should you change your mind and we can give you the proper paperwork for you to fill out.  Conditions/risks identified: Yes; Reviewed health maintenance screenings with patient today and relevant education, vaccines, and/or referrals were provided. Please continue to do your personal lifestyle choices by: daily care of teeth and gums, regular physical activity (goal should be 5 days a week for 30 minutes), eat a healthy diet, avoid tobacco and drug use, limiting any alcohol intake, taking a low-dose aspirin (if not allergic or have been advised by your provider otherwise) and taking vitamins and minerals as recommended by your provider. Continue doing brain stimulating activities (puzzles, reading, adult coloring books, staying active) to keep memory sharp. Continue to eat heart healthy diet (full of fruits, vegetables, whole grains, lean protein, water--limit salt, fat, and sugar intake) and increase physical activity as tolerated.  Next appointment: Please schedule your next Medicare Wellness Visit with your Nurse Health Advisor in 1 year by calling [phone].  Preventive Care 58 Years and Older, Male Preventive care refers to lifestyle choices and  visits with your health care provider that can promote health and wellness. What does preventive care include?  A yearly physical exam. This is also called an annual well check.  Dental exams once or twice a year.  Routine eye exams. Ask your health care provider how often you should have your eyes checked.  Personal lifestyle choices, including:  Daily care of your teeth and gums.  Regular physical activity.  Eating a healthy diet.  Avoiding tobacco and drug use.  Limiting alcohol use.  Practicing safe sex.  Taking low doses of aspirin every day.  Taking vitamin and mineral supplements as recommended by your health care provider. What happens during an annual well check? The services and screenings done by your health care provider during your annual well check will depend on your age, overall health, lifestyle risk factors, and family history of disease. Counseling  Your health care provider may ask you questions about your:  Alcohol use.  Tobacco use.  Drug use.  Emotional well-being.  Home and relationship well-being.  Sexual activity.  Eating habits.  History of falls.  Memory and ability to understand (cognition).  Work and work Statistician. Screening  You may have the following tests or measurements:  Height, weight, and BMI.  Blood pressure.  Lipid and cholesterol levels. These may be checked every 5 years, or more frequently if you are over 73 years old.  Skin check.  Lung cancer screening. You may have this screening every year starting at age 75 if you have a 30-pack-year history of smoking and currently smoke or have quit within the past 15 years.  Fecal occult blood test (FOBT) of the stool. You may have this test every year starting at age 90.  Flexible sigmoidoscopy or colonoscopy.  You may have a sigmoidoscopy every 5 years or a colonoscopy every 10 years starting at age 16.  Prostate cancer screening. Recommendations will vary  depending on your family history and other risks.  Hepatitis C blood test.  Hepatitis B blood test.  Sexually transmitted disease (STD) testing.  Diabetes screening. This is done by checking your blood sugar (glucose) after you have not eaten for a while (fasting). You may have this done every 1-3 years.  Abdominal aortic aneurysm (AAA) screening. You may need this if you are a current or former smoker.  Osteoporosis. You may be screened starting at age 47 if you are at high risk. Talk with your health care provider about your test results, treatment options, and if necessary, the need for more tests. Vaccines  Your health care provider may recommend certain vaccines, such as:  Influenza vaccine. This is recommended every year.  Tetanus, diphtheria, and acellular pertussis (Tdap, Td) vaccine. You may need a Td booster every 10 years.  Zoster vaccine. You may need this after age 22.  Pneumococcal 13-valent conjugate (PCV13) vaccine. One dose is recommended after age 50.  Pneumococcal polysaccharide (PPSV23) vaccine. One dose is recommended after age 36. Talk to your health care provider about which screenings and vaccines you need and how often you need them. This information is not intended to replace advice given to you by your health care provider. Make sure you discuss any questions you have with your health care provider. Document Released: 07/16/2015 Document Revised: 03/08/2016 Document Reviewed: 04/20/2015 Elsevier Interactive Patient Education  2017 [place] Prevention in the Home Falls can cause injuries. They can happen to people of all ages. There are many things you can do to make your home safe and to help prevent falls. What can I do on the outside of my home?  Regularly fix the edges of walkways and driveways and fix any cracks.  Remove anything that might make you trip as you walk through a door, such as a raised step or threshold.  Trim any bushes  or trees on the path to your home.  Use bright outdoor lighting.  Clear any walking paths of anything that might make someone trip, such as rocks or tools.  Regularly check to see if handrails are loose or broken. Make sure that both sides of any steps have handrails.  Any raised decks and porches should have guardrails on the edges.  Have any leaves, snow, or ice cleared regularly.  Use sand or salt on walking paths during winter.  Clean up any spills in your garage right away. This includes oil or grease spills. What can I do in the bathroom?  Use night lights.  Install grab bars by the toilet and in the tub and shower. Do not use towel bars as grab bars.  Use non-skid mats or decals in the tub or shower.  If you need to sit down in the shower, use a plastic, non-slip stool.  Keep the floor dry. Clean up any water that spills on the floor as soon as it happens.  Remove soap buildup in the tub or shower regularly.  Attach bath mats securely with double-sided non-slip rug tape.  Do not have throw rugs and other things on the floor that can make you trip. What can I do in the bedroom?  Use night lights.  Make sure that you have a light by your bed that is easy to reach.  Do not use any sheets  or blankets that are too big for your bed. They should not hang down onto the floor.  Have a firm chair that has side arms. You can use this for support while you get dressed.  Do not have throw rugs and other things on the floor that can make you trip. What can I do in the kitchen?  Clean up any spills right away.  Avoid walking on wet floors.  Keep items that you use a lot in easy-to-reach places.  If you need to reach something above you, use a strong step stool that has a grab bar.  Keep electrical cords out of the way.  Do not use floor polish or wax that makes floors slippery. If you must use wax, use non-skid floor wax.  Do not have throw rugs and other things on  the floor that can make you trip. What can I do with my stairs?  Do not leave any items on the stairs.  Make sure that there are handrails on both sides of the stairs and use them. Fix handrails that are broken or loose. Make sure that handrails are as long as the stairways.  Check any carpeting to make sure that it is firmly attached to the stairs. Fix any carpet that is loose or worn.  Avoid having throw rugs at the top or bottom of the stairs. If you do have throw rugs, attach them to the floor with carpet tape.  Make sure that you have a light switch at the top of the stairs and the bottom of the stairs. If you do not have them, ask someone to add them for you. What else can I do to help prevent falls?  Wear shoes that:  Do not have high heels.  Have rubber bottoms.  Are comfortable and fit you well.  Are closed at the toe. Do not wear sandals.  If you use a stepladder:  Make sure that it is fully opened. Do not climb a closed stepladder.  Make sure that both sides of the stepladder are locked into place.  Ask someone to hold it for you, if possible.  Clearly mark and make sure that you can see:  Any grab bars or handrails.  First and last steps.  Where the edge of each step is.  Use tools that help you move around (mobility aids) if they are needed. These include:  Canes.  Walkers.  Scooters.  Crutches.  Turn on the lights when you go into a dark area. Replace any light bulbs as soon as they burn out.  Set up your furniture so you have a clear path. Avoid moving your furniture around.  If any of your floors are uneven, fix them.  If there are any pets around you, be aware of where they are.  Review your medicines with your doctor. Some medicines can make you feel dizzy. This can increase your chance of falling. Ask your doctor what other things that you can do to help prevent falls. This information is not intended to replace advice given to you by  your health care provider. Make sure you discuss any questions you have with your health care provider. Document Released: 04/15/2009 Document Revised: 11/25/2015 Document Reviewed: 07/24/2014 Elsevier Interactive Patient Education  2017 [employer].

## 2020-06-16 ENCOUNTER — Encounter (INDEPENDENT_AMBULATORY_CARE_PROVIDER_SITE_OTHER): Payer: Self-pay | Admitting: Ophthalmology

## 2020-06-16 ENCOUNTER — Other Ambulatory Visit: Payer: Self-pay

## 2020-06-16 ENCOUNTER — Ambulatory Visit (INDEPENDENT_AMBULATORY_CARE_PROVIDER_SITE_OTHER): Payer: PPO | Admitting: Ophthalmology

## 2020-06-16 DIAGNOSIS — H353221 Exudative age-related macular degeneration, left eye, with active choroidal neovascularization: Secondary | ICD-10-CM

## 2020-06-16 DIAGNOSIS — H353111 Nonexudative age-related macular degeneration, right eye, early dry stage: Secondary | ICD-10-CM

## 2020-06-16 MED ORDER — AFLIBERCEPT 2MG/0.05ML IZ SOLN FOR KALEIDOSCOPE
2.0000 mg | INTRAVITREAL | Status: AC | PRN
  Administered 2020-06-16: 2 mg via INTRAVITREAL

## 2020-06-16 NOTE — Assessment & Plan Note (Signed)
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  The new vessels triggering subretinal fluid, have been controlled with intravitreal Eylea to date, will repeat injection today and examination again in 10-week

## 2020-06-16 NOTE — Progress Notes (Signed)
06/16/2020     CHIEF COMPLAINT Patient presents for Retina Follow Up   HISTORY OF PRESENT ILLNESS: Justin [name]. is a 79 y.o. male who presents to the clinic today for:   HPI    Retina Follow Up    Patient presents with  Wet AMD.  In left eye.  Severity is moderate.  Duration of 8 weeks.  Since onset it is stable.  I, the attending physician,  performed the HPI with the patient and updated documentation appropriately.          Comments    8 Week Wet AMD f\u OS. Possible Eylea OS OCT  Pt states vision is stable. Denies complaints.       Last edited by Rockie Neighbours, [place] on 06/16/2020  8:22 AM. (History)      Referring physician: [name], MD [place],  [place] [address]  HISTORICAL INFORMATION:   Selected notes from the MEDICAL RECORD NUMBER    Lab Results  Component Value Date   HGBA1C 5.6 07/28/2015     CURRENT MEDICATIONS: No current outpatient medications on file. (Ophthalmic Drugs)   No current facility-administered medications for this visit. (Ophthalmic Drugs)   Current Outpatient Medications (Other)  Medication Sig  . aspirin 81 MG EC tablet Take 1 tablet (81 mg total) by mouth daily.  . carbidopa-levodopa (SINEMET IR) 25-100 MG tablet For breakthrough restless leg, you can take 1 tablet at bedtime.  OK to take extra tab as needed during the day.  . Cholecalciferol 1000 UNITS tablet Take 3,000 Units by mouth daily.   . Cyanocobalamin (VITAMIN B-12) 1000 MCG SUBL DISSOLVE 2 TABLETS IN MOUTH EVERY DAY  . finasteride (PROSCAR) 5 MG tablet TAKE 1 TABLET BY MOUTH EVERY DAY  . furosemide (LASIX) 20 MG tablet Take 1-2 tablets (20-40 mg total) by mouth daily.  [name] gabapentin (NEURONTIN) 300 MG capsule TAKE 1 CAPSULE (300 MG TOTAL) BY MOUTH 4 (FOUR) TIMES DAILY.  [name] HYDROcodone-acetaminophen (NORCO) 7.5-325 MG tablet Take 1 tablet by mouth every 6 (six) hours as needed for severe pain.  [name] HYDROcodone-acetaminophen (NORCO)  7.5-325 MG tablet Take 1 tablet by mouth every 6 (six) hours as needed for severe pain.  [name] HYDROcodone-acetaminophen (NORCO) 7.5-325 MG tablet Take 1 tablet by mouth every 6 (six) hours as needed for severe pain. Please fill on or after 05/31/20  . meclizine (ANTIVERT) 12.5 MG tablet TAKE 1 TABLET (12.5 MG TOTAL) BY MOUTH 3 (THREE) TIMES DAILY AS NEEDED FOR DIZZINESS.  . methylPREDNISolone (MEDROL DOSEPAK) 4 MG TBPK tablet As directed  . Multiple Vitamin (MULTIVITAMIN) capsule Take 1 capsule by mouth daily.    . Multiple Vitamins-Minerals (PRESERVISION AREDS PO) Take 2 tablets by mouth 2 (two) times daily.  . pravastatin (PRAVACHOL) 20 MG tablet TAKE 1 TABLET BY MOUTH EVERY DAY  . rOPINIRole (REQUIP) 2 MG tablet TAKE 1 TABLET BY MOUTH AT 12PM, 1 TABLET AT 4PM AND 1 TABLETS AT BEDTIME  . tamsulosin (FLOMAX) 0.4 MG CAPS capsule TAKE 1 CAPSULE BY MOUTH EVERY DAY  . terazosin (HYTRIN) 2 MG capsule TAKE 1 CAPSULE BY MOUTH EVERYDAY AT BEDTIME  . triamcinolone cream (KENALOG) 0.1 % Apply 1 application topically 2 (two) times daily.   No current facility-administered medications for this visit. (Other)      REVIEW OF SYSTEMS:    ALLERGIES Allergies  Allergen Reactions  . Levofloxacin     REACTION: hands pealed    PAST MEDICAL HISTORY Past  Medical History:  Diagnosis Date  . Alcoholism ([place])    Dry 13 years  . Basal cell carcinoma    Right Chest  . Chronic insomnia    On ambien x 2 years  . Depression   . Diverticulosis of colon   . Dizzy spells   . Elbow fracture, left 2009   [name]  . Glaucoma   . History of TIAs   . Hyperkeratosis   . LBP (low back pain)   . Melanoma [facility]) 2009   x3 [name]  . Osteoarthritis   . Restless leg syndrome   . Tinnitus   . Tobacco abuse    Past Surgical History:  Procedure Laterality Date  . CATARACT EXTRACTION    . mda     Dr. [name] wet injection  . MELANOMA EXCISION  2009   x3  . RECONSTRUCTION MEDIAL COLLATERAL LIGAMENT  ELBOW W/ TENDON GRAFT  2009   Left, [name]    FAMILY HISTORY Family History  Problem Relation Age of Onset  . Cancer Mother        ?breast  . Cancer Father        Lung    SOCIAL HISTORY Social History   Tobacco Use  . Smoking status: Former Smoker    Packs/day: 3.00    Years: 38.00    Pack years: 114.00    Types: Cigarettes    Quit date: 08/30/1990    Years since quitting: 29.8  . Smokeless tobacco: Never Used  . Tobacco comment: states he quit 1986  Vaping Use  . Vaping Use: Never used  Substance Use Topics  . Alcohol use: No    Alcohol/week: 0.0 standard drinks    Comment: PREVIOUS - DRY 13 yrs  . Drug use: Not Currently         OPHTHALMIC EXAM:  Base Eye Exam    Visual Acuity (Snellen - Linear)      Right Left   Dist [place] 20/20 20/60 -2   Dist ph [place]  NI       Tonometry (Tonopen, 8:16 AM)      Right Left   Pressure 10 12       Pupils      Pupils Dark Light Shape React APD   Right PERRL 3 2 Round Slow None   Left PERRL 3 2 Round Slow None       Visual Fields (Counting fingers)      Left Right    Full Full       Neuro/Psych    Oriented x3: Yes   Mood/Affect: Normal       Dilation    Left eye: 1.0% Mydriacyl, 2.5% Phenylephrine @ 8:16 AM        Slit Lamp and Fundus Exam    External Exam      Right Left   External Normal Normal       Slit Lamp Exam      Right Left   Lids/Lashes Normal Normal   Conjunctiva/Sclera White and quiet White and quiet   Cornea Clear Clear   Anterior Chamber Deep and quiet Deep and quiet   Iris Round and reactive Round and reactive   Lens Posterior chamber intraocular lens Posterior chamber intraocular lens   Anterior Vitreous Normal Normal       Fundus Exam      Right Left   Posterior Vitreous  Posterior vitreous detachment   Disc  Normal   C/D Ratio  0.65   Macula  Retinal pigment epithelial detachment, Retinal pigment epithelial mottling, Drusen, Hard drusen, Atrophy, , intermediate age related  macular degeneration, Geographic atrophy   Vessels  Normal   Periphery  Normal          IMAGING AND PROCEDURES  Imaging and Procedures for 06/16/20  OCT, Retina - OU - Both Eyes       Right Eye Quality was good. Scan locations included subfoveal. Central Foveal Thickness: 284. Progression has been stable.   Left Eye Quality was good. Scan locations included subfoveal. Central Foveal Thickness: 272. Progression has improved. Findings include pigment epithelial detachment, subretinal hyper-reflective material, no IRF, no SRF.   Notes Much less active pigment epithelial detachment subfoveal OS with preserved and improved acuity       Intravitreal Injection, Pharmacologic Agent - OS - Left Eye       Time Out 06/16/2020. 8:56 AM. Confirmed correct patient, procedure, site, and patient consented.   Anesthesia Topical anesthesia was used. Anesthetic medications included Akten 3.5%.   Procedure Preparation included Tobramycin 0.3%, 10% betadine to eyelids, 5% betadine to ocular surface. A 30 gauge needle was used.   Injection:  2 mg aflibercept Alfonse Flavors) SOLN   NDC: [id number], Lot: [id number]   Route: Intravitreal, Site: Left Eye, Waste: 0 mg  Post-op Post injection exam found visual acuity of at least counting fingers. The patient tolerated the procedure well. There were no complications. The patient received written and verbal post procedure care education. Post injection medications were not given.                 ASSESSMENT/PLAN:  Exudative age-related macular degeneration of left eye with active choroidal neovascularization (HCC) Vascularized pigment epithelial detachment with large choroidal vessel visible in an atrophic region under the papillomacular bundle, now the new blood supply to the pigment epithelial detachment.  The new vessels triggering subretinal fluid, have been controlled with intravitreal Eylea to date, will repeat injection today and  examination again in 10-week      ICD-10-CM   1. Exudative age-related macular degeneration of left eye with active choroidal neovascularization (HCC)  H35.3221 OCT, Retina - OU - Both Eyes    Intravitreal Injection, Pharmacologic Agent - OS - Left Eye    aflibercept (EYLEA) SOLN 2 mg  2. Early stage nonexudative age-related macular degeneration of right eye  H35.3111     1.  OS, hemorrhagic vascularized pigment epithelial detachment in the past now much improved on continuous intravitreal Eylea. Subfoveal component has much less subretinal fluid, yet still slightly persistent on 2 separate cuts adjacent to the pigment epithelial detachment.  Proved acuity has occurred with treatment over the last year.  2.  We will maintain 8-week follow-up examination  3.  Dilate OU next  Ophthalmic Meds Ordered this visit:  Meds ordered this encounter  Medications  . aflibercept (EYLEA) SOLN 2 mg       Return in about 8 weeks (around 08/11/2020) for DILATE OU, EYLEA OCT, OS.  There are no Patient Instructions on file for this visit.   Explained the diagnoses, plan, and follow up with the patient and they expressed understanding.  Patient expressed understanding of the importance of proper follow up care.   Clent Demark Jolicia Delira M.D. Diseases & Surgery of the Retina and Vitreous Retina [facility] Di[facility] [place] 06/16/20     Abbreviations: M myopia (nearsighted); A astigmatism; H hyperopia (farsighted); P presbyopia; Mrx spectacle prescription;  CTL contact lenses; OD right eye;  OS left eye; OU both eyes  XT exotropia; ET esotropia; PEK punctate epithelial keratitis; PEE punctate epithelial erosions; DES dry eye syndrome; MGD meibomian gland dysfunction; ATs artificial tears; PFAT's preservative free artificial tears; [place] nuclear sclerotic cataract; PSC posterior subcapsular cataract; ERM epi-retinal membrane; PVD posterior vitreous detachment; RD retinal detachment; DM diabetes mellitus; DR diabetic  retinopathy; NPDR non-proliferative diabetic retinopathy; PDR proliferative diabetic retinopathy; CSME clinically significant macular edema; DME diabetic macular edema; dbh dot blot hemorrhages; CWS cotton wool spot; POAG primary open angle glaucoma; C/D cup-to-disc ratio; HVF humphrey visual field; GVF goldmann visual field; OCT optical coherence tomography; IOP intraocular pressure; BRVO Branch retinal vein occlusion; CRVO central retinal vein occlusion; CRAO central retinal artery occlusion; BRAO branch retinal artery occlusion; RT retinal tear; SB scleral buckle; PPV pars plana vitrectomy; VH Vitreous hemorrhage; PRP panretinal laser photocoagulation; IVK intravitreal kenalog; VMT vitreomacular traction; MH Macular hole;  NVD neovascularization of the disc; NVE neovascularization elsewhere; AREDS age related eye disease study; ARMD age related macular degeneration; POAG primary open angle glaucoma; EBMD epithelial/anterior basement membrane dystrophy; ACIOL anterior chamber intraocular lens; IOL intraocular lens; PCIOL posterior chamber intraocular lens; Phaco/IOL phacoemulsification with intraocular lens placement; [place] photorefractive keratectomy; LASIK laser assisted in situ keratomileusis; HTN hypertension; DM diabetes mellitus; COPD chronic obstructive pulmonary disease

## 2020-06-21 ENCOUNTER — Telehealth (INDEPENDENT_AMBULATORY_CARE_PROVIDER_SITE_OTHER): Payer: PPO | Admitting: Family

## 2020-06-21 DIAGNOSIS — R197 Diarrhea, unspecified: Secondary | ICD-10-CM | POA: Diagnosis not present

## 2020-06-21 NOTE — Patient Instructions (Signed)
Food Choices to Help Relieve Diarrhea, Adult When you have diarrhea, the foods you eat and your eating habits are very important. Choosing the right foods and drinks can help:  Relieve diarrhea.  Replace lost fluids and nutrients.  Prevent dehydration. What general guidelines should I follow?  Relieving diarrhea  Choose foods with less than 2 g or .07 oz. of fiber per serving.  Limit fats to less than 8 tsp (38 g or 1.34 oz.) a day.  Avoid the following: ? Foods and beverages sweetened with high-fructose corn syrup, honey, or sugar alcohols such as xylitol, sorbitol, and mannitol. ? Foods that contain a lot of fat or sugar. ? Fried, greasy, or spicy foods. ? High-fiber grains, breads, and cereals. ? Raw fruits and vegetables.  Eat foods that are rich in probiotics. These foods include dairy products such as yogurt and fermented milk products. They help increase healthy bacteria in the stomach and intestines (gastrointestinal tract, or GI tract).  If you have lactose intolerance, avoid dairy products. These may make your diarrhea worse.  Take medicine to help stop diarrhea (antidiarrheal medicine) only as told by your health care provider. Replacing nutrients  Eat small meals or snacks every 3-4 hours.  Eat bland foods, such as white rice, toast, or baked potato, until your diarrhea starts to get better. Gradually reintroduce nutrient-rich foods as tolerated or as told by your health care provider. This includes: ? Well-cooked protein foods. ? Peeled, seeded, and soft-cooked fruits and vegetables. ? Low-fat dairy products.  Take vitamin and mineral supplements as told by your health care provider. Preventing dehydration  Start by sipping water or a special solution to prevent dehydration (oral rehydration solution, ORS). Urine that is clear or pale yellow means that you are getting enough fluid.  Try to drink at least 8-10 cups of fluid each day to help replace lost  fluids.  You may add other liquids in addition to water, such as clear juice or decaffeinated sports drinks, as tolerated or as told by your health care provider.  Avoid drinks with caffeine, such as coffee, tea, or soft drinks.  Avoid alcohol. What foods are recommended?     The items listed may not be a complete list. Talk with your health care provider about what dietary choices are best for you. Grains White rice. White, French, or pita breads (fresh or toasted), including plain rolls, buns, or bagels. White pasta. Saltine, soda, or graham crackers. Pretzels. Low-fiber cereal. Cooked cereals made with water (such as cornmeal, farina, or cream cereals). Plain muffins. Matzo. Melba toast. Zwieback. Vegetables Potatoes (without the skin). Most well-cooked and canned vegetables without skins or seeds. Tender lettuce. Fruits Apple sauce. Fruits canned in juice. Cooked apricots, cherries, grapefruit, peaches, pears, or plums. Fresh bananas and cantaloupe. Meats and other protein foods Baked or boiled chicken. Eggs. Tofu. Fish. Seafood. Smooth nut butters. Ground or well-cooked tender beef, ham, veal, lamb, pork, or poultry. Dairy Plain yogurt, kefir, and unsweetened liquid yogurt. Lactose-free milk, buttermilk, skim milk, or soy milk. Low-fat or nonfat hard cheese. Beverages Water. Low-calorie sports drinks. Fruit juices without pulp. Strained tomato and vegetable juices. Decaffeinated teas. Sugar-free beverages not sweetened with sugar alcohols. Oral rehydration solutions, if approved by your health care provider. Seasoning and other foods Bouillon, broth, or soups made from recommended foods. What foods are not recommended? The items listed may not be a complete list. Talk with your health care provider about what dietary choices are best for you. Grains Whole   grain, whole wheat, bran, or rye breads, rolls, pastas, and crackers. Wild or brown rice. Whole grain or bran cereals. Barley.  Oats and oatmeal. Corn tortillas or taco shells. Granola. Popcorn. Vegetables Raw vegetables. Fried vegetables. Cabbage, broccoli, Brussels sprouts, artichokes, baked beans, beet greens, corn, kale, legumes, peas, sweet potatoes, and yams. Potato skins. Cooked spinach and cabbage. Fruits Dried fruit, including raisins and dates. Raw fruits. Stewed or dried prunes. Canned fruits with syrup. Meat and other protein foods Fried or fatty meats. Deli meats. Chunky nut butters. Nuts and seeds. Beans and lentils. Bacon. Hot dogs. Sausage. Dairy High-fat cheeses. Whole milk, chocolate milk, and beverages made with milk, such as milk shakes. Half-and-half. Cream. sour cream. Ice cream. Beverages Caffeinated beverages (such as coffee, tea, soda, or energy drinks). Alcoholic beverages. Fruit juices with pulp. Prune juice. Soft drinks sweetened with high-fructose corn syrup or sugar alcohols. High-calorie sports drinks. Fats and oils Butter. Cream sauces. Margarine. Salad oils. Plain salad dressings. Olives. Avocados. Mayonnaise. Sweets and desserts Sweet rolls, doughnuts, and sweet breads. Sugar-free desserts sweetened with sugar alcohols such as xylitol and sorbitol. Seasoning and other foods Honey. Hot sauce. Chili powder. Gravy. Cream-based or milk-based soups. Pancakes and waffles. Summary  When you have diarrhea, the foods you eat and your eating habits are very important.  Make sure you get at least 8-10 cups of fluid each day, or enough to keep your urine clear or pale yellow.  Eat bland foods and gradually reintroduce healthy, nutrient-rich foods as tolerated, or as told by your health care provider.  Avoid high-fiber, fried, greasy, or spicy foods. This information is not intended to replace advice given to you by your health care provider. Make sure you discuss any questions you have with your health care provider. Document Revised: 10/10/2018 Document Reviewed: 06/16/2016 [employer]vier Patient  Education  2020 [employer]  

## 2020-06-21 NOTE — Progress Notes (Signed)
Justin [name]. is a 79 y.o. male with the following history as recorded in EpicCare:  Patient Active Problem List   Diagnosis Date Noted  . Elbow pain, chronic, left 05/31/2020  . Leg pain, anterior, right 05/31/2020  . Diastolic dysfunction 53/29/9242  . Rash 10/16/2019  . Exudative age-related macular degeneration of left eye with active choroidal neovascularization ([place]) 10/15/2019  . Intermediate stage nonexudative age-related macular degeneration of left eye 10/15/2019  . Early stage nonexudative age-related macular degeneration of right eye 10/15/2019  . Pseudophakia 10/15/2019  . Posterior vitreous detachment of left eye 10/15/2019  . Cough 09/08/2019  . Hamstring injury, left, initial encounter 12/20/2018  . GERD (gastroesophageal reflux disease) 11/13/2018  . Chest pain 11/13/2018  . Cellulitis of hand, left 11/09/2018  . Coronary artery disease 10/24/2018  . Claudication ([place]) 02/22/2018  . 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  . Gallstones 11/23/2017  . Anemia 08/28/2017  . DOE (dyspnea on exertion) 07/31/2017  . Fatigue 07/31/2017  . HTN (hypertension) 11/29/2016  . Acute bronchitis 04/12/2016  . Smoking greater than 30 pack years 02/02/2016  . Vertigo 02/09/2015  . Left hand pain 01/14/2015  . Elevated TSH 04/09/2014  . Abdominal pain, lower 04/07/2014  . Well adult exam 02/28/2013  . Alcoholism in recovery ([place]) 02/28/2013  . Ingrowing toenail of left foot 08/29/2012  . Allergic rhinitis 10/10/2011  . Dupuytren's contracture of hand 07/05/2011  . Vitamin B12 deficiency 12/23/2010  . BPH (benign prostatic hyperplasia) 12/23/2010  . SINUSITIS, ACUTE 03/24/2010  . CHEST PAIN 03/01/2010  . Low back pain 07/27/2009  . TOBACCO USE, QUIT 07/27/2009  . DIVERTICULOSIS, COLON 03/28/2009  . DIVERTICULITIS 03/26/2009  . ABDOMINAL PAIN, LOWER 03/26/2009  . RESTLESS LEG SYNDROME 10/28/2008  . SKIN RASH, ALLERGIC 10/01/2008  . ARM PAIN, LEFT  10/01/2008  . Edema 10/01/2008  . Neuropathy 04/22/2008  . UTI 02/26/2008  . RECTAL FISSURE 02/24/2008  . FEVER UNSPECIFIED 02/24/2008  . Vitamin D deficiency 01/21/2008  . INSOMNIA, CHRONIC 12/23/2007  . Depression 12/23/2007  . Osteoarthritis 12/23/2007  . SYNCOPE 12/23/2007  . DIZZINESS 12/23/2007  . CAROTID BRUIT, LEFT 12/23/2007  . FRACTURE, ARM, LEFT 12/23/2007    Current Outpatient Medications  Medication Sig Dispense Refill  . aspirin 81 MG EC tablet Take 1 tablet (81 mg total) by mouth daily. 100 tablet 3  . carbidopa-levodopa (SINEMET IR) 25-100 MG tablet For breakthrough restless leg, you can take 1 tablet at bedtime.  OK to take extra tab as needed during the day. 100 tablet 3  . Cholecalciferol 1000 UNITS tablet Take 3,000 Units by mouth daily.     . Cyanocobalamin (VITAMIN B-12) 1000 MCG SUBL DISSOLVE 2 TABLETS IN MOUTH EVERY DAY 60 tablet 8  . finasteride (PROSCAR) 5 MG tablet TAKE 1 TABLET BY MOUTH EVERY DAY 90 tablet 3  . furosemide (LASIX) 20 MG tablet Take 1-2 tablets (20-40 mg total) by mouth daily. 60 tablet 3  . gabapentin (NEURONTIN) 300 MG capsule TAKE 1 CAPSULE (300 MG TOTAL) BY MOUTH 4 (FOUR) TIMES DAILY. 360 capsule 1  . HYDROcodone-acetaminophen (NORCO) 7.5-325 MG tablet Take 1 tablet by mouth every 6 (six) hours as needed for severe pain. 120 tablet 0  . HYDROcodone-acetaminophen (NORCO) 7.5-325 MG tablet Take 1 tablet by mouth every 6 (six) hours as needed for severe pain. 120 tablet 0  . HYDROcodone-acetaminophen (NORCO) 7.5-325 MG tablet Take 1 tablet by mouth every 6 (six) hours as needed for severe pain. Please fill  on or after 05/31/20 120 tablet 0  . meclizine (ANTIVERT) 12.5 MG tablet TAKE 1 TABLET (12.5 MG TOTAL) BY MOUTH 3 (THREE) TIMES DAILY AS NEEDED FOR DIZZINESS. 60 tablet 1  . methylPREDNISolone (MEDROL DOSEPAK) 4 MG TBPK tablet As directed 21 tablet 0  . Multiple Vitamin (MULTIVITAMIN) capsule Take 1 capsule by mouth daily.      . Multiple  Vitamins-Minerals (PRESERVISION AREDS PO) Take 2 tablets by mouth 2 (two) times daily.    . pravastatin (PRAVACHOL) 20 MG tablet TAKE 1 TABLET BY MOUTH EVERY DAY 90 tablet 1  . rOPINIRole (REQUIP) 2 MG tablet TAKE 1 TABLET BY MOUTH AT 12PM, 1 TABLET AT 4PM AND 1 TABLETS AT BEDTIME 360 tablet 1  . tamsulosin (FLOMAX) 0.4 MG CAPS capsule TAKE 1 CAPSULE BY MOUTH EVERY DAY 90 capsule 3  . terazosin (HYTRIN) 2 MG capsule TAKE 1 CAPSULE BY MOUTH EVERYDAY AT BEDTIME 90 capsule 3  . triamcinolone cream (KENALOG) 0.1 % Apply 1 application topically 2 (two) times daily. 100 g 0   No current facility-administered medications for this visit.    Allergies: Levofloxacin  Past Medical History:  Diagnosis Date  . Alcoholism ([place])    Dry 13 years  . Basal cell carcinoma    Right Chest  . Chronic insomnia    On ambien x 2 years  . Depression   . Diverticulosis of colon   . Dizzy spells   . Elbow fracture, left 2009   [name]  . Glaucoma   . History of TIAs   . Hyperkeratosis   . LBP (low back pain)   . Melanoma [facility]) 2009   x3 [name]  . Osteoarthritis   . Restless leg syndrome   . Tinnitus   . Tobacco abuse     Past Surgical History:  Procedure Laterality Date  . CATARACT EXTRACTION    . mda     Dr. [name] wet injection  . MELANOMA EXCISION  2009   x3  . RECONSTRUCTION MEDIAL COLLATERAL LIGAMENT ELBOW W/ TENDON GRAFT  2009   Left, [name]    Family History  Problem Relation Age of Onset  . Cancer Mother        ?breast  . Cancer Father        Lung    Social History   Tobacco Use  . Smoking status: Former Smoker    Packs/day: 3.00    Years: 38.00    Pack years: 114.00    Types: Cigarettes    Quit date: 08/30/1990    Years since quitting: 29.8  . Smokeless tobacco: Never Used  . Tobacco comment: states he quit 1986  Substance Use Topics  . Alcohol use: No    Alcohol/week: 0.0 standard drinks    Comment: PREVIOUS - DRY 13 yrs    Subjective:   I connected with  [place]. on 06/21/20 at  1:40 PM EST by a telephone call and verified that I am speaking with the correct person using two identifiers.   I discussed the limitations of evaluation and management by telemedicine and the availability of in person appointments. The patient expressed understanding and agreed to proceed. Provider in office/ patient is at home; provider and patient are only 2 people on telephone call.   Patient notes that he felt constipated over the weekend and took [place]. He notes that he has now developed secondary diarrhea and is having increased gas/ mild nausea; he has  been able to eat oatmeal today with no concerns; also ate scrambled eggs and dry toast last night; wonders about other treatment options to help with the diarrhea; denies any fever or vomiting; no dark or discolored stool;   Objective:  There were no vitals filed for this visit.  Lungs: Respirations unlabored;  Neurologic: Alert and oriented; speech intact; face symmetrical; moves all extremities well; CNII-XII intact without focal deficit   Assessment:  1. Diarrhea, unspecified type     Plan:  Secondary to recent use of Milk of Magnesia; BRAT diet discussed; can try some Gatorade as well; will need to be seen in the office if the symptoms persist;  Time spent 10 minutes   No follow-ups on file.  No orders of the defined types were placed in this encounter.   Requested Prescriptions    No prescriptions requested or ordered in this encounter

## 2020-06-28 DIAGNOSIS — M79604 Pain in right leg: Secondary | ICD-10-CM | POA: Diagnosis not present

## 2020-06-29 DIAGNOSIS — M545 Low back pain, unspecified: Secondary | ICD-10-CM | POA: Diagnosis not present

## 2020-07-01 DIAGNOSIS — M5416 Radiculopathy, lumbar region: Secondary | ICD-10-CM | POA: Diagnosis not present

## 2020-07-01 DIAGNOSIS — M545 Low back pain, unspecified: Secondary | ICD-10-CM | POA: Diagnosis not present

## 2020-07-06 ENCOUNTER — Other Ambulatory Visit: Payer: Self-pay

## 2020-07-06 ENCOUNTER — Encounter (HOSPITAL_BASED_OUTPATIENT_CLINIC_OR_DEPARTMENT_OTHER): Payer: Self-pay | Admitting: Orthopedic Surgery

## 2020-07-06 NOTE — Progress Notes (Signed)
Spoke w/ via phone for pre-op interview---pt Lab needs dos----   none            Lab results------none COVID test ------07-07-2020 at 1430 pm Arrive at -------1030 am 07-07-2020  NPO after MN NO Solid Food.  Clear liquids from MN until---930 am Medications to take morning of surgery -----gabapentin, carbidopa-levidopa, requip prn, finasteride, tamsulosin, prednosone if not finished, oxycodone er prn Diabetic medication -----n/a Patient Special Instructions -----none Pre-Op special Istructions -----none Patient verbalized understanding of instructions that were given at this phone interview. Patient denies shortness of breath, chest pain, fever, cough at this phone interview.

## 2020-07-07 ENCOUNTER — Other Ambulatory Visit (HOSPITAL_COMMUNITY)
Admission: RE | Admit: 2020-07-07 | Discharge: 2020-07-07 | Disposition: A | Discharge status: Home / Self Care | Payer: PPO | Source: Ambulatory Visit | Attending: Orthopedic Surgery | Admitting: Orthopedic Surgery

## 2020-07-07 DIAGNOSIS — Z20822 Contact with and (suspected) exposure to covid-19: Secondary | ICD-10-CM | POA: Diagnosis not present

## 2020-07-07 DIAGNOSIS — Z01812 Encounter for preprocedural laboratory examination: Secondary | ICD-10-CM | POA: Diagnosis not present

## 2020-07-08 LAB — SARS CORONAVIRUS 2 (TAT 6-24 HRS): SARS Coronavirus 2: NEGATIVE

## 2020-07-09 ENCOUNTER — Ambulatory Visit (HOSPITAL_BASED_OUTPATIENT_CLINIC_OR_DEPARTMENT_OTHER)
Admission: RE | Admit: 2020-07-09 | Discharge: 2020-07-09 | Disposition: A | Discharge status: Home / Self Care | Payer: PPO | Attending: Orthopedic Surgery | Admitting: Orthopedic Surgery

## 2020-07-09 ENCOUNTER — Encounter (HOSPITAL_BASED_OUTPATIENT_CLINIC_OR_DEPARTMENT_OTHER)
Admission: RE | Disposition: A | Discharge status: Home / Self Care | Payer: Self-pay | Source: Home / Self Care | Attending: Orthopedic Surgery

## 2020-07-09 ENCOUNTER — Ambulatory Visit (HOSPITAL_BASED_OUTPATIENT_CLINIC_OR_DEPARTMENT_OTHER): Payer: PPO | Admitting: Certified Registered Nurse Anesthetist

## 2020-07-09 ENCOUNTER — Encounter (HOSPITAL_BASED_OUTPATIENT_CLINIC_OR_DEPARTMENT_OTHER): Payer: Self-pay | Admitting: Orthopedic Surgery

## 2020-07-09 DIAGNOSIS — K219 Gastro-esophageal reflux disease without esophagitis: Secondary | ICD-10-CM | POA: Diagnosis not present

## 2020-07-09 DIAGNOSIS — T8484XA Pain due to internal orthopedic prosthetic devices, implants and grafts, initial encounter: Secondary | ICD-10-CM | POA: Insufficient documentation

## 2020-07-09 DIAGNOSIS — E559 Vitamin D deficiency, unspecified: Secondary | ICD-10-CM | POA: Diagnosis not present

## 2020-07-09 DIAGNOSIS — Z881 Allergy status to other antibiotic agents status: Secondary | ICD-10-CM | POA: Insufficient documentation

## 2020-07-09 DIAGNOSIS — I10 Essential (primary) hypertension: Secondary | ICD-10-CM | POA: Diagnosis not present

## 2020-07-09 DIAGNOSIS — Z79899 Other long term (current) drug therapy: Secondary | ICD-10-CM | POA: Insufficient documentation

## 2020-07-09 DIAGNOSIS — S42402S Unspecified fracture of lower end of left humerus, sequela: Secondary | ICD-10-CM | POA: Diagnosis not present

## 2020-07-09 DIAGNOSIS — M25522 Pain in left elbow: Secondary | ICD-10-CM | POA: Diagnosis not present

## 2020-07-09 DIAGNOSIS — Y831 Surgical operation with implant of artificial internal device as the cause of abnormal reaction of the patient, or of later complication, without mention of misadventure at the time of the procedure: Secondary | ICD-10-CM | POA: Insufficient documentation

## 2020-07-09 DIAGNOSIS — Z87891 Personal history of nicotine dependence: Secondary | ICD-10-CM | POA: Diagnosis not present

## 2020-07-09 DIAGNOSIS — Z85820 Personal history of malignant melanoma of skin: Secondary | ICD-10-CM | POA: Insufficient documentation

## 2020-07-09 DIAGNOSIS — T84191A Other mechanical complication of internal fixation device of left humerus, initial encounter: Secondary | ICD-10-CM | POA: Diagnosis not present

## 2020-07-09 DIAGNOSIS — Z472 Encounter for removal of internal fixation device: Secondary | ICD-10-CM | POA: Diagnosis not present

## 2020-07-09 HISTORY — DX: Presence of spectacles and contact lenses: Z97.3

## 2020-07-09 HISTORY — DX: Dependence on other enabling machines and devices: Z99.89

## 2020-07-09 HISTORY — DX: Gastro-esophageal reflux disease without esophagitis: K21.9

## 2020-07-09 HISTORY — DX: Unspecified macular degeneration: H35.30

## 2020-07-09 HISTORY — DX: Presence of dental prosthetic device (complete) (partial): Z97.2

## 2020-07-09 HISTORY — DX: Benign prostatic hyperplasia without lower urinary tract symptoms: N40.0

## 2020-07-09 HISTORY — DX: Unspecified osteoarthritis, unspecified site: M19.90

## 2020-07-09 HISTORY — PX: HARDWARE REMOVAL: SHX979

## 2020-07-09 SURGERY — REMOVAL, HARDWARE
Anesthesia: General | Site: Elbow | Laterality: Left

## 2020-07-09 MED ORDER — DEXAMETHASONE SODIUM PHOSPHATE 10 MG/ML IJ SOLN
INTRAMUSCULAR | Status: DC | PRN
  Administered 2020-07-09: 10 mg via INTRAVENOUS

## 2020-07-09 MED ORDER — DEXAMETHASONE SODIUM PHOSPHATE 10 MG/ML IJ SOLN
INTRAMUSCULAR | Status: AC
  Filled 2020-07-09: qty 1

## 2020-07-09 MED ORDER — GLYCOPYRROLATE PF 0.2 MG/ML IJ SOSY
PREFILLED_SYRINGE | INTRAMUSCULAR | Status: DC | PRN
  Administered 2020-07-09: .2 mg via INTRAVENOUS

## 2020-07-09 MED ORDER — CEFAZOLIN SODIUM-DEXTROSE 2-4 GM/100ML-% IV SOLN
INTRAVENOUS | Status: AC
  Filled 2020-07-09: qty 100

## 2020-07-09 MED ORDER — OXYCODONE HCL 5 MG PO TABS: 5.0000 mg | ORAL_TABLET | Freq: Once | ORAL | Status: DC | PRN

## 2020-07-09 MED ORDER — ONDANSETRON HCL 4 MG/2ML IJ SOLN
INTRAMUSCULAR | Status: DC | PRN
  Administered 2020-07-09: 4 mg via INTRAVENOUS

## 2020-07-09 MED ORDER — FENTANYL CITRATE (PF) 100 MCG/2ML IJ SOLN
INTRAMUSCULAR | Status: AC
  Filled 2020-07-09: qty 2

## 2020-07-09 MED ORDER — EPHEDRINE 5 MG/ML INJ
INTRAVENOUS | Status: AC
  Filled 2020-07-09: qty 10

## 2020-07-09 MED ORDER — OXYCODONE HCL 5 MG/5ML PO SOLN
5.0000 mg | Freq: Once | ORAL | Status: DC | PRN
Start: 2020-07-09 — End: 2020-07-09

## 2020-07-09 MED ORDER — LACTATED RINGERS IV SOLN: INTRAVENOUS | Status: DC

## 2020-07-09 MED ORDER — FENTANYL CITRATE (PF) 100 MCG/2ML IJ SOLN: 25.0000 ug | INTRAMUSCULAR | Status: DC | PRN

## 2020-07-09 MED ORDER — ONDANSETRON HCL 4 MG/2ML IJ SOLN
INTRAMUSCULAR | Status: AC
  Filled 2020-07-09: qty 2

## 2020-07-09 MED ORDER — FENTANYL CITRATE (PF) 100 MCG/2ML IJ SOLN
INTRAMUSCULAR | Status: DC | PRN
  Administered 2020-07-09 (×3): 50 ug via INTRAVENOUS

## 2020-07-09 MED ORDER — CEFAZOLIN SODIUM-DEXTROSE 2-4 GM/100ML-% IV SOLN
2.0000 g | INTRAVENOUS | Status: AC
  Administered 2020-07-09: 2 g via INTRAVENOUS

## 2020-07-09 MED ORDER — LIDOCAINE 2% (20 MG/ML) 5 ML SYRINGE
INTRAMUSCULAR | Status: DC | PRN
  Administered 2020-07-09: 60 mg via INTRAVENOUS

## 2020-07-09 MED ORDER — PROPOFOL 10 MG/ML IV BOLUS
INTRAVENOUS | Status: DC | PRN
  Administered 2020-07-09: 140 mg via INTRAVENOUS

## 2020-07-09 MED ORDER — BUPIVACAINE-EPINEPHRINE (PF) 0.25% -1:200000 IJ SOLN
INTRAMUSCULAR | Status: DC | PRN
  Administered 2020-07-09: 5 mL

## 2020-07-09 MED ORDER — PROPOFOL 10 MG/ML IV BOLUS
INTRAVENOUS | Status: AC
  Filled 2020-07-09: qty 20

## 2020-07-09 MED ORDER — ONDANSETRON HCL 4 MG/2ML IJ SOLN: 4.0000 mg | Freq: Once | INTRAMUSCULAR | Status: DC | PRN

## 2020-07-09 MED ORDER — GLYCOPYRROLATE PF 0.2 MG/ML IJ SOSY
PREFILLED_SYRINGE | INTRAMUSCULAR | Status: AC
  Filled 2020-07-09: qty 1

## 2020-07-09 MED ORDER — HYDROCODONE-ACETAMINOPHEN 7.5-325 MG PO TABS: 1.0000 | ORAL_TABLET | Freq: Four times a day (QID) | ORAL | 0 refills | Status: DC | PRN

## 2020-07-09 MED ORDER — EPHEDRINE SULFATE-NACL 50-0.9 MG/10ML-% IV SOSY
PREFILLED_SYRINGE | INTRAVENOUS | Status: DC | PRN
  Administered 2020-07-09: 10 mg via INTRAVENOUS

## 2020-07-09 SURGICAL SUPPLY — 52 items
BANDAGE ESMARK 6X9 LF (GAUZE/BANDAGES/DRESSINGS) IMPLANT
BLADE SURG 10 STRL SS (BLADE) IMPLANT
BLADE SURG 15 STRL LF DISP TIS (BLADE) ×1 IMPLANT
BLADE SURG 15 STRL SS (BLADE) ×2
BNDG CMPR 9X6 STRL LF SNTH (GAUZE/BANDAGES/DRESSINGS)
BNDG ELASTIC 4X5.8 VLCR STR LF (GAUZE/BANDAGES/DRESSINGS) ×1 IMPLANT
BNDG ELASTIC 6X5.8 VLCR STR LF (GAUZE/BANDAGES/DRESSINGS) IMPLANT
BNDG ESMARK 6X9 LF (GAUZE/BANDAGES/DRESSINGS)
BNDG GAUZE ELAST 4 BULKY (GAUZE/BANDAGES/DRESSINGS) ×2 IMPLANT
COVER MAYO STAND STRL (DRAPES) IMPLANT
COVER WAND RF STERILE (DRAPES) ×2 IMPLANT
CUFF TOURN SGL QUICK 34 (TOURNIQUET CUFF)
CUFF TRNQT CYL 34X4.125X (TOURNIQUET CUFF) ×1 IMPLANT
DRAPE C-ARM 35X43 STRL (DRAPES) IMPLANT
DRAPE C-ARM 42X120 X-RAY (DRAPES) IMPLANT
DRAPE EXTREMITY T 121X128X90 (DISPOSABLE) ×2 IMPLANT
DRAPE INCISE IOBAN 66X45 STRL (DRAPES) ×2 IMPLANT
DRAPE SHEET LG 3/4 BI-LAMINATE (DRAPES) ×2 IMPLANT
DRAPE U-SHAPE 47X51 STRL (DRAPES) ×2 IMPLANT
DRSG ADAPTIC 3X8 NADH LF (GAUZE/BANDAGES/DRESSINGS) IMPLANT
DRSG PAD ABDOMINAL 8X10 ST (GAUZE/BANDAGES/DRESSINGS) IMPLANT
DURAPREP 26ML APPLICATOR (WOUND CARE) ×2 IMPLANT
ELECT REM PT RETURN 15FT ADLT (MISCELLANEOUS) ×2 IMPLANT
GAUZE SPONGE 4X4 12PLY STRL (GAUZE/BANDAGES/DRESSINGS) ×1 IMPLANT
GAUZE SPONGE 4X4 12PLY STRL LF (GAUZE/BANDAGES/DRESSINGS) ×1 IMPLANT
GLOVE BIO SURGEON STRL SZ7.5 (GLOVE) ×4 IMPLANT
GLOVE INDICATOR 8.0 STRL GRN (GLOVE) ×4 IMPLANT
GOWN STRL REUS W/TWL XL LVL3 (GOWN DISPOSABLE) ×4 IMPLANT
KIT TURNOVER CYSTO (KITS) ×2 IMPLANT
NEEDLE HYPO 22GX1.5 SAFETY (NEEDLE) ×1 IMPLANT
NS IRRIG 500ML POUR BTL (IV SOLUTION) ×2 IMPLANT
PACK ARTHROSCOPY DSU (CUSTOM PROCEDURE TRAY) ×1 IMPLANT
PACK BASIN DAY SURGERY FS (CUSTOM PROCEDURE TRAY) ×2 IMPLANT
PADDING CAST COTTON 6X4 STRL (CAST SUPPLIES) IMPLANT
PENCIL SMOKE EVACUATOR (MISCELLANEOUS) ×2 IMPLANT
SPONGE LAP 4X18 RFD (DISPOSABLE) IMPLANT
STOCKINETTE 6  STRL (DRAPES) ×2
STOCKINETTE 6 STRL (DRAPES) IMPLANT
STRIP CLOSURE SKIN 1/2X4 (GAUZE/BANDAGES/DRESSINGS) ×2 IMPLANT
SUCTION FRAZIER HANDLE 10FR (MISCELLANEOUS) ×2
SUCTION TUBE FRAZIER 10FR DISP (MISCELLANEOUS) IMPLANT
SUT MNCRL AB 3-0 PS2 27 (SUTURE) ×1 IMPLANT
SUT MON AB 2-0 SH 27 (SUTURE) ×2
SUT MON AB 2-0 SH27 (SUTURE) ×1 IMPLANT
SUT VIC AB 1 CT1 36 (SUTURE) IMPLANT
SUT VIC AB 2-0 CT1 27 (SUTURE)
SUT VIC AB 2-0 CT1 TAPERPNT 27 (SUTURE) IMPLANT
SYR BULB EAR ULCER 3OZ GRN STR (SYRINGE) ×1 IMPLANT
SYR CONTROL 10ML LL (SYRINGE) ×1 IMPLANT
TOWEL OR 17X26 10 PK STRL BLUE (TOWEL DISPOSABLE) ×2 IMPLANT
TUBE CONNECTING 12X1/4 (SUCTIONS) ×2 IMPLANT
YANKAUER SUCT BULB TIP NO VENT (SUCTIONS) ×2 IMPLANT

## 2020-07-09 NOTE — Transfer of Care (Signed)
Immediate Anesthesia Transfer of Care Note  Patient: Stefano Trulson [facility].  Procedure(s) Performed: Procedure(s) (LRB): Left elbow hardware removal (Left)  Patient Location: PACU  Anesthesia Type: General  Level of Consciousness: awake, oriented, sedated and patient cooperative  Airway & Oxygen Therapy: Patient Spontanous Breathing and Patient connected to face mask oxygen  Post-op Assessment: Report given to PACU RN and Post -op Vital signs reviewed and stable  Post vital signs: Reviewed and stable  Complications: No apparent anesthesia complications  Last Vitals:  Vitals Value Taken Time  BP    Temp    Pulse 81 07/09/20 1340  Resp 13 07/09/20 1340  SpO2 97 % 07/09/20 1340  Vitals shown include unvalidated device data.  Last Pain:  Vitals:   07/09/20 1053  TempSrc: Oral  PainSc: 3       Patients Stated Pain Goal: 4 (07/11/30 3557)  Complications: No complications documented.

## 2020-07-09 NOTE — Anesthesia Preprocedure Evaluation (Addendum)
Anesthesia Evaluation  Patient identified by MRN, date of birth, ID band Patient awake    Reviewed: Allergy & Precautions, NPO status , Patient's Chart, lab work & pertinent test results  History of Anesthesia Complications Negative for: history of anesthetic complications  Airway Mallampati: II  TM Distance: >3 FB Neck ROM: Full    Dental  (+) Dental Advisory Given, Partial Upper   Pulmonary former smoker,    Pulmonary exam normal        Cardiovascular hypertension, + CAD  Normal cardiovascular exam   '21 TTE - EF 60 to 65%. Mild concentric left ventricular  hypertrophy. Grade II diastolic dysfunction (pseudonormalization). Mildly elevated pulmonary artery systolic pressure. LA was moderately dilated. RA was mildly dilated. Trivial mitral valve regurgitation.    Neuro/Psych PSYCHIATRIC DISORDERS Depression  RLS  TIA   GI/Hepatic GERD  Medicated and Controlled,(+)     substance abuse (sober >10 yrs)  alcohol use,   Endo/Other  negative endocrine ROS  Renal/GU negative Renal ROS     Musculoskeletal  (+) Arthritis ,   Abdominal   Peds  Hematology negative hematology ROS (+)   Anesthesia Other Findings Covid test negative   Reproductive/Obstetrics                            Anesthesia Physical Anesthesia Plan  ASA: III  Anesthesia Plan: General   Post-op Pain Management:    Induction: Intravenous  PONV Risk Score and Plan: 2 and Treatment may vary due to age or medical condition and Ondansetron  Airway Management Planned: LMA  Additional Equipment: None  Intra-op Plan:   Post-operative Plan: Extubation in OR  Informed Consent: I have reviewed the patients History and Physical, chart, labs and discussed the procedure including the risks, benefits and alternatives for the proposed anesthesia with the patient or authorized representative who has indicated his/her  understanding and acceptance.     Dental advisory given  Plan Discussed with: CRNA and Anesthesiologist  Anesthesia Plan Comments:        Anesthesia Quick Evaluation

## 2020-07-09 NOTE — Brief Op Note (Signed)
07/09/2020  1:08 PM  PATIENT:  [place].  80 y.o. male  PRE-OPERATIVE DIAGNOSIS:  Left elbow hardware failure  POST-OPERATIVE DIAGNOSIS:  1. Painful hardware left elbow  PROCEDURE:  Procedure(s) with comments: Left elbow hardware removal (Left) - 60 mins  SURGEON:  Surgeon(s) and Role:    * Stann Mainland, Elly Modena, MD - Primary  PHYSICIAN ASSISTANT: Jonelle Sidle, PA-C   ANESTHESIA:   local and general  EBL:  minimal  BLOOD ADMINISTERED:none  DRAINS: none   LOCAL MEDICATIONS USED:  MARCAINE     SPECIMEN:  Source of Specimen:  left elbow screw to patient  DISPOSITION OF SPECIMEN:  home with patient  COUNTS:  YES  TOURNIQUET:  * Missing tourniquet times found for documented tourniquets in log: [id number] *  DICTATION: .Note written in EPIC  PLAN OF CARE: Discharge to home after PACU  PATIENT DISPOSITION:  PACU - hemodynamically stable.   Delay start of Pharmacological VTE agent (>24hrs) due to surgical blood loss or risk of bleeding: not applicable

## 2020-07-09 NOTE — Anesthesia Postprocedure Evaluation (Signed)
Anesthesia Post Note  Patient: Justin [name] [facility].  Procedure(s) Performed: Left elbow hardware removal (Left Elbow)     Patient location during evaluation: PACU Anesthesia Type: General Level of consciousness: awake and alert Pain management: pain level controlled Vital Signs Assessment: post-procedure vital signs reviewed and stable Respiratory status: spontaneous breathing, nonlabored ventilation, respiratory function stable and patient connected to nasal cannula oxygen Cardiovascular status: blood pressure returned to baseline and stable Postop Assessment: no apparent nausea or vomiting Anesthetic complications: no   No complications documented.  Last Vitals:  Vitals:   07/09/20 1345 07/09/20 1400  BP: (!) 144/75 (!) 153/77  Pulse: 76 70  Resp: (!) 9 10  Temp: 36.7 C   SpO2: 98% 99%    Last Pain:  Vitals:   07/09/20 1400  TempSrc:   PainSc: 0-No pain                 Audry Pili

## 2020-07-09 NOTE — H&P (Signed)
ORTHOPAEDIC H and P  REQUESTING PHYSICIAN: [name], MD  PCP:  [name], MD  Chief Complaint: Left elbow hardware pain  HPI: Justin [name]. is a 80 y.o. male who complains of left elbow pain and deformity due to a loose screw from previous open reduction internal fixation.  Here today for hardware removal.  No new complaints.  Past Medical History:  Diagnosis Date  . Alcoholism ([place])    Dry 13 years  . Basal cell carcinoma    Right Chest  . BPH (benign prostatic hyperplasia)   . Diverticulosis of colon   . Dizzy spells    none recent  . DJD (degenerative joint disease)    lower back  . Elbow fracture, left 2009   [name]  . GERD (gastroesophageal reflux disease)   . Glaucoma   . History of TIAs 1 yrs ago  . Hyperkeratosis   . LBP (low back pain)   . Macular degeneration    left wet right dry sees [name]  . Melanoma [facility]) 2009   x3 [name]  . Osteoarthritis   . Restless leg syndrome   . Tinnitus   . Tobacco abuse   . Uses walker   . Wears glasses    reading  . Wears partial dentures    upper   Past Surgical History:  Procedure Laterality Date  . CATARACT EXTRACTION Bilateral 2018  . mda     Dr. [name] wet injection  . MELANOMA EXCISION  2009   x3  . RECONSTRUCTION MEDIAL COLLATERAL LIGAMENT ELBOW W/ TENDON GRAFT  2009   Left, [name]  . TONSILLECTOMY  as child   Social History   Socioeconomic History  . Marital status: Married    Spouse name: Not on file  . Number of children: 2  . Years of education: Not on file  . Highest education level: Not on file  Occupational History  . Occupation: [place] [employer]    Employer: RETIRED  . Occupation: Chiropodist  . Occupation: Heritage manager  Tobacco Use  . Smoking status: Former Smoker    Packs/day: 3.00    Years: 38.00    Pack years: 114.00    Types: Cigarettes  . Smokeless tobacco: Never Used  . Tobacco comment: states he quit May 15 1985  Vaping Use  . Vaping Use: Never used  Substance and Sexual Activity  . Alcohol use: No    Alcohol/week: 0.0 standard drinks    Comment: PREVIOUS - DRY 25 yrs  . Drug use: Not Currently  . Sexual activity: Not Currently  Other Topics Concern  . Not on file  Social History Narrative   Right handed   One story home   Drinks coffee q am   Social Determinants of Health   Financial Resource Strain: Low Risk   . Difficulty of Paying Living Expenses: Not hard at all  Food Insecurity: No Food Insecurity  . Worried About Charity fundraiser in the Last Year: Never true  . Ran Out of Food in the Last Year: Never true  Transportation Needs: No Transportation Needs  . Lack of Transportation (Medical): No  . Lack of Transportation (Non-Medical): No  Physical Activity: Inactive  . Days of Exercise per Week: 0 days  . Minutes of Exercise per Session: 0 min  Stress: No Stress Concern Present  . Feeling of Stress : Not at all  Social Connections: Socially Integrated  .  Frequency of Communication with Friends and Family: More than three times a week  . Frequency of Social Gatherings with Friends and Family: Once a week  . Attends Religious Services: More than 4 times per year  . Active Member of Clubs or Organizations: No  . Attends Archivist Meetings: More than 4 times per year  . Marital Status: Married   Family History  Problem Relation Age of Onset  . Cancer Mother        ?breast  . Cancer Father        Lung   Allergies  Allergen Reactions  . Levofloxacin     REACTION: hands pealed   Prior to Admission medications   Medication Sig Start Date End Date Taking? Authorizing Provider  carbidopa-levodopa (SINEMET IR) 25-100 MG tablet For breakthrough restless leg, you can take 1 tablet at bedtime.  OK to take extra tab as needed during the day. 03/22/20  Yes Patel, Donika K, DO  Cholecalciferol 1000 UNITS tablet Take 3,000 Units by mouth daily.   Yes [provider]  Cyanocobalamin (VITAMIN B-12) 1000 MCG SUBL DISSOLVE 2 TABLETS IN MOUTH EVERY DAY Patient taking differently: 2 (two) times daily. 06/24/15  Yes Plotnikov, Evie Lacks, MD  famotidine (PEPCID) 20 MG tablet Take 20 mg by mouth as needed for heartburn or indigestion. [facility] brand pepcid   Yes [provider]  finasteride (PROSCAR) 5 MG tablet TAKE 1 TABLET BY MOUTH EVERY DAY 02/19/20  Yes Plotnikov, Evie Lacks, MD  gabapentin (NEURONTIN) 300 MG capsule TAKE 1 CAPSULE (300 MG TOTAL) BY MOUTH 4 (FOUR) TIMES DAILY. 06/01/20  Yes Plotnikov, Evie Lacks, MD  methylPREDNISolone (MEDROL DOSEPAK) 4 MG TBPK tablet As directed Patient taking differently: As directed 10 mg bid finishes 07-09-2020 predinsone 05/31/20  Yes Plotnikov, Evie Lacks, MD  Multiple Vitamin (MULTIVITAMIN) capsule Take 1 capsule by mouth daily.   Yes [provider]  Multiple Vitamins-Minerals (PRESERVISION AREDS PO) Take 2 tablets by mouth 2 (two) times daily.   Yes [provider]  oxycodone (OXY-IR) 5 MG capsule Take 5 mg by mouth every 6 (six) hours as needed.   Yes [provider]  pravastatin (PRAVACHOL) 20 MG tablet TAKE 1 TABLET BY MOUTH EVERY DAY Patient taking differently: daily. 06/01/20  Yes Plotnikov, Evie Lacks, MD  rOPINIRole (REQUIP) 2 MG tablet TAKE 1 TABLET BY MOUTH AT 12PM, 1 TABLET AT 4PM AND 1 TABLETS AT BEDTIME 03/22/20  Yes Patel, Donika K, DO  tamsulosin (FLOMAX) 0.4 MG CAPS capsule TAKE 1 CAPSULE BY MOUTH EVERY DAY 06/01/20  Yes Plotnikov, Evie Lacks, MD  terazosin (HYTRIN) 2 MG capsule TAKE 1 CAPSULE BY MOUTH EVERYDAY AT BEDTIME 11/24/19  Yes Plotnikov, Evie Lacks, MD  aspirin 81 MG EC tablet Take 1 tablet (81 mg total) by mouth daily. 12/10/19   Plotnikov, Evie Lacks, MD  HYDROcodone-acetaminophen (NORCO) 7.5-325 MG tablet Take 1 tablet by mouth every 6 (six) hours as needed for severe pain. Patient not taking: Reported on 07/06/2020 05/31/20   Plotnikov, Evie Lacks, MD  meclizine  (ANTIVERT) 12.5 MG tablet TAKE 1 TABLET (12.5 MG TOTAL) BY MOUTH 3 (THREE) TIMES DAILY AS NEEDED FOR DIZZINESS. 07/08/19   Plotnikov, Evie Lacks, MD   No results found.  Positive ROS: All other systems have been reviewed and were otherwise negative with the exception of those mentioned in the HPI and as above.  Physical Exam: General: Alert, no acute distress Cardiovascular: No pedal edema Respiratory: No cyanosis, no use  of accessory musculature GI: No organomegaly, abdomen is soft and non-tender Skin: No lesions in the area of chief complaint Neurologic: Sensation intact distally Psychiatric: Patient is competent for consent with normal mood and affect Lymphatic: No axillary or cervical lymphadenopathy  MUSCULOSKELETAL:  Left upper extremity has prominence in the lateral aspect of the lateral elbow from hardware prominence.  This is painful to palpation.  Distally neurovascular intact.  Does have flexion contractures consistent with Dupuytren's contractures of the hand.  Assessment: Left elbow painful hardware  Plan: -Plan to proceed today with selective hardware removal.  We will just remove the loose screws as this is a well fixed plate that is over 66 years old.  We will plan for immediate postoperative range of motion and weightbearing as tolerated.  -Follow-up with me 2 weeks postoperatively.  He will discharge home from PACU.  -We again reviewed the procedure in detail.  All risk and benefits were discussed, questions solicited and answered to his satisfaction.    [name], MD Cell [phone]    07/09/2020 12:29 PM

## 2020-07-09 NOTE — Discharge Instructions (Signed)
-  Maintain postoperative bandages for 3 days.  You may remove at that time and begin showering.  You should not submerge underwater.  You will follow up with Dr. Stann Mainland in 2 weeks.  -You may begin immediate range of motion at the left elbow as tolerated.  He can also weight-bear through the left arm as tolerated.  You should apply ice to the left elbow throughout the day for 30 minutes at a time.  -For mild to moderate pain use Tylenol and Advil around-the-clock.  For breakthrough pain use hydrocodone as necessary.  Call your surgeon if you experience:   1.  Fever over 101.0. 2.  Inability to urinate. 3.  Nausea and/or vomiting. 4.  Extreme swelling or bruising at the surgical site. 5.  Continued bleeding from the incision. 6.  Increased pain, redness or drainage from the incision. 7.  Problems related to your pain medication. 8.  Any problems and/or concerns. 9. Any changes in sensation or color of left hand.   Post Anesthesia Home Care Instructions  Activity: Get plenty of rest for the remainder of the day. A responsible individual must stay with you for 24 hours following the procedure.  For the next 24 hours, DO NOT: -Drive a car -Paediatric nurse -Drink alcoholic beverages -Take any medication unless instructed by your physician -Make any legal decisions or sign important papers.  Meals: Start with liquid foods such as gelatin or soup. Progress to regular foods as tolerated. Avoid greasy, spicy, heavy foods. If nausea and/or vomiting occur, drink only clear liquids until the nausea and/or vomiting subsides. Call your physician if vomiting continues.  Special Instructions/Symptoms: Your throat may feel dry or sore from the anesthesia or the breathing tube placed in your throat during surgery. If this causes discomfort, gargle with warm salt water. The discomfort should disappear within 24 hours.

## 2020-07-09 NOTE — Anesthesia Procedure Notes (Signed)
Procedure Name: LMA Insertion Date/Time: 07/09/2020 12:41 PM Performed by: Rogers Blocker, CRNA Pre-anesthesia Checklist: Patient identified, Emergency Drugs available, Suction available and Patient being monitored Patient Re-evaluated:Patient Re-evaluated prior to induction Oxygen Delivery Method: Circle System Utilized Preoxygenation: Pre-oxygenation with 100% oxygen Induction Type: IV induction Ventilation: Mask ventilation without difficulty LMA: LMA inserted LMA Size: 5.0 Number of attempts: 1 Placement Confirmation: positive ETCO2 Tube secured with: Tape Dental Injury: Teeth and Oropharynx as per pre-operative assessment

## 2020-07-09 NOTE — Op Note (Signed)
Date of Surgery: 07/09/2020  INDICATIONS: Mr. Barley is a 80 y.o.-year-old male with a left elbow painful hardware with failure of screws.  He is here today for selective hardware removal for a prominent lateral screw from a previous open reduction internal fixation greater than 10 years ago.;  The patient did consent to the procedure after discussion of the risks and benefits.  PREOPERATIVE DIAGNOSIS:  1.  Left distal humerus hardware failure 2.  Left distal humerus hardware pain  POSTOPERATIVE DIAGNOSIS: Same.  PROCEDURE:  Removal of buried/deep orthopedic hardware left distal humerus  SURGEON: Geralynn Rile, M.D.  ASSIST: Jonelle Sidle, PA-C  Assistant attestation: PA Mcclung was utilized throughout the procedure for positioning patient, approach to the retained hardware, retraction of tissues and removal of hardware.  He was also used for closure and transport back to PACU.  He was present for the entire procedure..  ANESTHESIA:  general, local  IV FLUIDS AND URINE: See anesthesia.  ESTIMATED BLOOD LOSS: 5 mL.  IMPLANTS: none  DRAINS: none  COMPLICATIONS: None.  DESCRIPTION OF PROCEDURE: The patient was brought to the operating room and placed supine on the operating table.  The patient had been signed prior to the procedure and this was documented. The patient had the anesthesia placed by the anesthesiologist.  A time-out was performed to confirm that this was the correct patient, site, side and location. The patient did receive antibiotics prior to the incision and was re-dosed during the procedure as needed at indicated intervals.  A tourniquet was not placed.  The patient had the operative extremity prepped and draped in the standard surgical fashion.     We began the procedure by reestablishing the laterally based incision on the distal aspect.  There was some obvious prominence overlying the deep and retained screw.  Dissection was carried down through skin and  subcutaneous tissue.  We did encounter a pseudobursa overlying the screw head.  This was opened and ellipsed out.  We then identified the screw.  This was quite prominent and tenting the lateral skin.  Deep retractors were placed around the screw and this was removed without issue.  It was removed en bloc.  This was passed off the table and placed into a specimen cup for the patient to take home.  We then copiously irrigated the wound with normal saline.  We then closed in layers with 2-0 Monocryl for deep dermis and 3-0 subcuticular Monocryl for skin.  Steri-Strips and a standard sterile dressing were applied.   There were no noted intraoperative complications.  He was transported to PACU in stable condition.  POSTOPERATIVE PLAN:  Justin [name] will be weightbearing as tolerated and range of motion as tolerated immediately following surgery.  Will maintain postoperative bandages x3 days.  Then can remove and begin showering.  I will see him in the office in 2 weeks for postoperative wound check.  Discharge home from PACU.

## 2020-07-12 ENCOUNTER — Encounter (HOSPITAL_BASED_OUTPATIENT_CLINIC_OR_DEPARTMENT_OTHER): Payer: Self-pay | Admitting: Orthopedic Surgery

## 2020-07-17 DIAGNOSIS — M5416 Radiculopathy, lumbar region: Secondary | ICD-10-CM | POA: Diagnosis not present

## 2020-07-30 DIAGNOSIS — M25561 Pain in right knee: Secondary | ICD-10-CM | POA: Diagnosis not present

## 2020-08-06 ENCOUNTER — Other Ambulatory Visit: Payer: Self-pay | Admitting: Internal Medicine

## 2020-08-11 ENCOUNTER — Encounter (INDEPENDENT_AMBULATORY_CARE_PROVIDER_SITE_OTHER): Payer: Self-pay | Admitting: Ophthalmology

## 2020-08-11 ENCOUNTER — Ambulatory Visit (INDEPENDENT_AMBULATORY_CARE_PROVIDER_SITE_OTHER): Payer: PPO | Admitting: Ophthalmology

## 2020-08-11 ENCOUNTER — Other Ambulatory Visit: Payer: Self-pay

## 2020-08-11 DIAGNOSIS — H353221 Exudative age-related macular degeneration, left eye, with active choroidal neovascularization: Secondary | ICD-10-CM

## 2020-08-11 DIAGNOSIS — H353111 Nonexudative age-related macular degeneration, right eye, early dry stage: Secondary | ICD-10-CM

## 2020-08-11 MED ORDER — AFLIBERCEPT 2MG/0.05ML IZ SOLN FOR KALEIDOSCOPE
2.0000 mg | INTRAVITREAL | Status: AC | PRN
Start: 2020-08-11 — End: 2020-08-11
  Administered 2020-08-11: 2 mg via INTRAVITREAL

## 2020-08-11 NOTE — Progress Notes (Signed)
08/11/2020     CHIEF COMPLAINT Patient presents for Retina Follow Up (8 WK FU OU, POSS EYLEA OS////Pt reports stable vision OU. Pt denies any new F/F, pain, or pressure OU. /)   HISTORY OF PRESENT ILLNESS: Justin [name]. is a 80 y.o. male who presents to the clinic today for:   HPI    Retina Follow Up    Patient presents with  Wet AMD.  In left eye.  This started 8 weeks ago.  Duration of 8 weeks.  Since onset it is stable. Additional comments: 8 WK FU OU, POSS EYLEA OS    Pt reports stable vision OU. Pt denies any new F/F, pain, or pressure OU.         Last edited by Nichola Sizer D on 08/11/2020  8:06 AM. (History)      Referring physician: [name], MD [employer],   [id number]  HISTORICAL INFORMATION:   Selected notes from the MEDICAL RECORD NUMBER    Lab Results  Component Value Date   HGBA1C 5.6 07/28/2015     CURRENT MEDICATIONS: No current outpatient medications on file. (Ophthalmic Drugs)   No current facility-administered medications for this visit. (Ophthalmic Drugs)   Current Outpatient Medications (Other)  Medication Sig  . aspirin 81 MG EC tablet Take 1 tablet (81 mg total) by mouth daily.  . carbidopa-levodopa (SINEMET IR) 25-100 MG tablet For breakthrough restless leg, you can take 1 tablet at bedtime.  OK to take extra tab as needed during the day.  . Cholecalciferol 1000 UNITS tablet Take 3,000 Units by mouth daily.  . Cyanocobalamin (VITAMIN B-12) 1000 MCG SUBL DISSOLVE 2 TABLETS IN MOUTH EVERY DAY (Patient taking differently: 2 (two) times daily.)  . famotidine (PEPCID) 20 MG tablet Take 20 mg by mouth as needed for heartburn or indigestion. [facility] brand pepcid  . finasteride (PROSCAR) 5 MG tablet TAKE 1 TABLET BY MOUTH EVERY DAY  . gabapentin (NEURONTIN) 300 MG capsule TAKE 1 CAPSULE (300 MG TOTAL) BY MOUTH 4 (FOUR) TIMES DAILY.  [name] HYDROcodone-acetaminophen (NORCO) 7.5-325 MG tablet Take 1 tablet by mouth  every 6 (six) hours as needed for severe pain.  . meclizine (ANTIVERT) 12.5 MG tablet TAKE 1 TABLET (12.5 MG TOTAL) BY MOUTH 3 (THREE) TIMES DAILY AS NEEDED FOR DIZZINESS.  . methylPREDNISolone (MEDROL DOSEPAK) 4 MG TBPK tablet As directed (Patient taking differently: As directed 10 mg bid finishes 07-09-2020 predinsone)  . Multiple Vitamin (MULTIVITAMIN) capsule Take 1 capsule by mouth daily.  . Multiple Vitamins-Minerals (PRESERVISION AREDS PO) Take 2 tablets by mouth 2 (two) times daily.  [name] oxycodone (OXY-IR) 5 MG capsule Take 5 mg by mouth every 6 (six) hours as needed.  . pravastatin (PRAVACHOL) 20 MG tablet TAKE 1 TABLET BY MOUTH EVERY DAY (Patient taking differently: daily.)  . rOPINIRole (REQUIP) 2 MG tablet TAKE 1 TABLET BY MOUTH AT 12PM, 1 TABLET AT 4PM AND 1 TABLETS AT BEDTIME  . tamsulosin (FLOMAX) 0.4 MG CAPS capsule TAKE 1 CAPSULE BY MOUTH EVERY DAY  . terazosin (HYTRIN) 2 MG capsule TAKE 1 CAPSULE BY MOUTH EVERYDAY AT BEDTIME   No current facility-administered medications for this visit. (Other)      REVIEW OF SYSTEMS:    ALLERGIES Allergies  Allergen Reactions  . Levofloxacin     REACTION: hands pealed    PAST MEDICAL HISTORY Past Medical History:  Diagnosis Date  . Alcoholism ([place])    Dry 13 years  .  Basal cell carcinoma    Right Chest  . BPH (benign prostatic hyperplasia)   . Diverticulosis of colon   . Dizzy spells    none recent  . DJD (degenerative joint disease)    lower back  . Elbow fracture, left 2009   [name]  . GERD (gastroesophageal reflux disease)   . Glaucoma   . History of TIAs 1 yrs ago  . Hyperkeratosis   . LBP (low back pain)   . Macular degeneration    left wet right dry sees [name]  . Melanoma [facility]) 2009   x3 [name]  . Osteoarthritis   . Restless leg syndrome   . Tinnitus   . Tobacco abuse   . Uses walker   . Wears glasses    reading  . Wears partial dentures    upper   Past Surgical History:  Procedure  Laterality Date  . CATARACT EXTRACTION Bilateral 2018  . HARDWARE REMOVAL Left 07/09/2020   Procedure: Left elbow hardware removal;  Surgeon: [name], MD;  Location: [facility];  Service: Orthopedics;  Laterality: Left;  60 mins  . mda     Dr. [name] wet injection  . MELANOMA EXCISION  2009   x3  . RECONSTRUCTION MEDIAL COLLATERAL LIGAMENT ELBOW W/ TENDON GRAFT  2009   Left, [name]  . TONSILLECTOMY  as child    FAMILY HISTORY Family History  Problem Relation Age of Onset  . Cancer Mother        ?breast  . Cancer Father        Lung    SOCIAL HISTORY Social History   Tobacco Use  . Smoking status: Former Smoker    Packs/day: 3.00    Years: 38.00    Pack years: 114.00    Types: Cigarettes  . Smokeless tobacco: Never Used  . Tobacco comment: states he quit May 15 1985  Vaping Use  . Vaping Use: Never used  Substance Use Topics  . Alcohol use: No    Alcohol/week: 0.0 standard drinks    Comment: PREVIOUS - DRY 25 yrs  . Drug use: Not Currently         OPHTHALMIC EXAM: Base Eye Exam    Visual Acuity (ETDRS)      Right Left   Dist [place] 20/20 -2 20/70 -2   Dist ph [place]  20/60 +2       Tonometry (Tonopen, 8:12 AM)      Right Left   Pressure 12 10       Pupils      Pupils Dark Light Shape React APD   Right PERRL 3 2 Round Slow None   Left PERRL 3 2 Round Slow None       Visual Fields (Counting fingers)      Left Right    Full Full       Extraocular Movement      Right Left    Full Full       Neuro/Psych    Oriented x3: Yes   Mood/Affect: Normal       Dilation    Left eye: 1.0% Mydriacyl, 2.5% Phenylephrine @ 8:12 AM        Slit Lamp and Fundus Exam    External Exam      Right Left   External Normal Normal       Slit Lamp Exam      Right Left   Lids/Lashes Normal Normal  Conjunctiva/Sclera White and quiet White and quiet   Cornea Clear Clear   Anterior Chamber Deep and quiet Deep and quiet   Iris Round and  reactive Round and reactive   Lens Posterior chamber intraocular lens Posterior chamber intraocular lens   Anterior Vitreous Normal Normal       Fundus Exam      Right Left   Posterior Vitreous  Posterior vitreous detachment   Disc  Normal   C/D Ratio  0.65   Macula  Retinal pigment epithelial detachment, Retinal pigment epithelial mottling, Drusen, Hard drusen, Atrophy,  intermediate age related macular degeneration, Geographic atrophy   Vessels  Normal   Periphery  Normal          IMAGING AND PROCEDURES  Imaging and Procedures for 08/11/20  OCT, Retina - OU - Both Eyes       Right Eye Quality was good. Scan locations included subfoveal. Central Foveal Thickness: 283. Progression has been stable.   Left Eye Quality was good. Scan locations included subfoveal. Central Foveal Thickness: 271. Progression has improved. Findings include pigment epithelial detachment, subretinal hyper-reflective material, no IRF, no SRF, abnormal foveal contour.   Notes Much less active pigment epithelial detachment subfoveal OS with preserved and improved acuity Repeat intravitreal Eylea OS today at 8-week interval.       Intravitreal Injection, Pharmacologic Agent - OS - Left Eye       Time Out 08/11/2020. 8:57 AM. Confirmed correct patient, procedure, site, and patient consented.   Anesthesia Topical anesthesia was used. Anesthetic medications included Akten 3.5%.   Procedure Preparation included Tobramycin 0.3%. A 30 gauge needle was used.   Injection:  2 mg aflibercept Alfonse Flavors) SOLN   NDC: [id number], Lot: [id number]   Route: Intravitreal, Site: Left Eye, Waste: 0 mg  Post-op Post injection exam found visual acuity of at least counting fingers. The patient tolerated the procedure well. There were no complications. The patient received written and verbal post procedure care education. Post injection medications were not given.                 ASSESSMENT/PLAN:  Early  stage nonexudative age-related macular degeneration of right eye No signs of CNVM OD by OCT today.  Exudative age-related macular degeneration of left eye with active choroidal neovascularization (HCC) Chronic active and sometimes recurrent CNVM with vascularized pigment epithelial detachment left eye subfoveal location, now overall improved since last occurrence with improved acuity and improved anatomy.  Currently at a stable configuration left eye on Eylea at 8-week interval examinations.  We will repeat exam injection today and exam again in 8 weeks      ICD-10-CM   1. Exudative age-related macular degeneration of left eye with active choroidal neovascularization (HCC)  H35.3221 OCT, Retina - OU - Both Eyes    Intravitreal Injection, Pharmacologic Agent - OS - Left Eye    aflibercept (EYLEA) SOLN 2 mg  2. Early stage nonexudative age-related macular degeneration of right eye  H35.3111     1.  Overall much improved since last recurrence of CNVM left eye.  Now at 8-week follow-up interval.  We will Repeat injection Eylea today and examination again in 8 weeks.  2.  3.  Ophthalmic Meds Ordered this visit:  Meds ordered this encounter  Medications  . aflibercept (EYLEA) SOLN 2 mg       Return in about 8 weeks (around 10/06/2020) for DILATE OU, EYLEA OCT, OS.  There are no Patient Instructions on file for this  visit.   Explained the diagnoses, plan, and follow up with the patient and they expressed understanding.  Patient expressed understanding of the importance of proper follow up care.   Clent Demark Aseem Sessums M.D. Diseases & Surgery of the Retina and Vitreous [facility] [place] 08/11/20     Abbreviations: M myopia (nearsighted); A astigmatism; H hyperopia (farsighted); P presbyopia; Mrx spectacle prescription;  CTL contact lenses; OD right eye; OS left eye; OU both eyes  XT exotropia; ET esotropia; PEK punctate epithelial keratitis; PEE punctate epithelial erosions; DES  dry eye syndrome; MGD meibomian gland dysfunction; ATs artificial tears; PFAT's preservative free artificial tears; [place] nuclear sclerotic cataract; PSC posterior subcapsular cataract; ERM epi-retinal membrane; PVD posterior vitreous detachment; RD retinal detachment; DM diabetes mellitus; DR diabetic retinopathy; NPDR non-proliferative diabetic retinopathy; PDR proliferative diabetic retinopathy; CSME clinically significant macular edema; DME diabetic macular edema; dbh dot blot hemorrhages; CWS cotton wool spot; POAG primary open angle glaucoma; C/D cup-to-disc ratio; HVF humphrey visual field; GVF goldmann visual field; OCT optical coherence tomography; IOP intraocular pressure; BRVO Branch retinal vein occlusion; CRVO central retinal vein occlusion; CRAO central retinal artery occlusion; BRAO branch retinal artery occlusion; RT retinal tear; SB scleral buckle; PPV pars plana vitrectomy; VH Vitreous hemorrhage; PRP panretinal laser photocoagulation; IVK intravitreal kenalog; VMT vitreomacular traction; MH Macular hole;  NVD neovascularization of the disc; NVE neovascularization elsewhere; AREDS age related eye disease study; ARMD age related macular degeneration; POAG primary open angle glaucoma; EBMD epithelial/anterior basement membrane dystrophy; ACIOL anterior chamber intraocular lens; IOL intraocular lens; PCIOL posterior chamber intraocular lens; Phaco/IOL phacoemulsification with intraocular lens placement; [place] photorefractive keratectomy; LASIK laser assisted in situ keratomileusis; HTN hypertension; DM diabetes mellitus; COPD chronic obstructive pulmonary disease

## 2020-08-11 NOTE — Assessment & Plan Note (Signed)
No signs of CNVM OD by OCT today 

## 2020-08-11 NOTE — Assessment & Plan Note (Signed)
Chronic active and sometimes recurrent CNVM with vascularized pigment epithelial detachment left eye subfoveal location, now overall improved since last occurrence with improved acuity and improved anatomy.  Currently at a stable configuration left eye on Eylea at 8-week interval examinations.  We will repeat exam injection today and exam again in 8 weeks

## 2020-08-19 ENCOUNTER — Other Ambulatory Visit: Payer: Self-pay

## 2020-08-19 ENCOUNTER — Ambulatory Visit: Payer: PPO | Admitting: Podiatry

## 2020-08-19 DIAGNOSIS — M79674 Pain in right toe(s): Secondary | ICD-10-CM | POA: Diagnosis not present

## 2020-08-19 DIAGNOSIS — G629 Polyneuropathy, unspecified: Secondary | ICD-10-CM | POA: Diagnosis not present

## 2020-08-19 DIAGNOSIS — Q828 Other specified congenital malformations of skin: Secondary | ICD-10-CM

## 2020-08-19 DIAGNOSIS — B351 Tinea unguium: Secondary | ICD-10-CM | POA: Diagnosis not present

## 2020-08-19 DIAGNOSIS — M79675 Pain in left toe(s): Secondary | ICD-10-CM | POA: Diagnosis not present

## 2020-08-23 DIAGNOSIS — M25561 Pain in right knee: Secondary | ICD-10-CM | POA: Diagnosis not present

## 2020-08-23 NOTE — Progress Notes (Signed)
Subjective: 80 y.o. returns the office today for painful, elongated, thickened toenails which he cannot trim himself and also a painful corn second toe on the left foot which make it difficult to wear shoes at times due to the pressure.  He thinks that maybe the toenail causing the pressure of the tip of the left second toe does ask if it needs to be removed.  No open lesions or drainage. Denies any redness, drainage or signs of infection. Denies any systemic complaints such as fevers, chills, nausea, vomiting.   PCP: Plotnikov, Evie Lacks, MD  Objective: AAO 3, NAD DP/PT pulses palpable, CRT less than 3 seconds Neurological status is unchanged.  Nails hypertrophic, dystrophic, elongated, brittle, discolored 10. There is tenderness overlying the nails 1-5 bilaterally. There is no surrounding erythema or drainage along the nail sites. The hyperkeratotic tissue the distal aspect of the left second toe.  No ongoing ulceration drainage or signs of infection.  Think this is was causing more discomfort as opposed to the toenail  There is deformity present the left third toe and the toe is rigid contracture at the level of the DIPJ.   Hammertoes present.  No pain with calf compression, swelling, warmth, erythema.  Assessment: Patient presents with symptomatic onychomycosis; hyperkeratotic lesion; left third toe deformity  Plan: -Treatment options including alternatives, risks, complications were discussed -Nails sharply debrided 10 without complication/bleeding. -Hyperkeratotic lesion sharply debrided x 1 without any complications or bleeding. Continue offloading to the toes.  Dispensed further offloading pads today. -Discussed daily foot inspection. If there are any changes, to call the office immediately.  -Follow-up in 9 weeks or sooner if any problems are to arise. In the meantime, encouraged to call the office with any questions, concerns, changes symptoms.  [name], DPM

## 2020-08-25 ENCOUNTER — Encounter (INDEPENDENT_AMBULATORY_CARE_PROVIDER_SITE_OTHER): Payer: PPO | Admitting: Ophthalmology

## 2020-08-30 ENCOUNTER — Ambulatory Visit (INDEPENDENT_AMBULATORY_CARE_PROVIDER_SITE_OTHER): Payer: PPO | Admitting: Internal Medicine

## 2020-08-30 ENCOUNTER — Encounter: Payer: Self-pay | Admitting: Internal Medicine

## 2020-08-30 ENCOUNTER — Other Ambulatory Visit: Payer: Self-pay

## 2020-08-30 DIAGNOSIS — I2510 Atherosclerotic heart disease of native coronary artery without angina pectoris: Secondary | ICD-10-CM

## 2020-08-30 DIAGNOSIS — G2581 Restless legs syndrome: Secondary | ICD-10-CM

## 2020-08-30 DIAGNOSIS — G8929 Other chronic pain: Secondary | ICD-10-CM

## 2020-08-30 DIAGNOSIS — G4700 Insomnia, unspecified: Secondary | ICD-10-CM | POA: Diagnosis not present

## 2020-08-30 DIAGNOSIS — R634 Abnormal weight loss: Secondary | ICD-10-CM

## 2020-08-30 DIAGNOSIS — M545 Low back pain, unspecified: Secondary | ICD-10-CM | POA: Diagnosis not present

## 2020-08-30 NOTE — Assessment & Plan Note (Signed)
On Norco 

## 2020-08-30 NOTE — Progress Notes (Signed)
Subjective:  Patient ID: Justin [name]., male    DOB: 01/13/1941  Age: 80 y.o. MRN: [id number]  CC: Follow-up (3 month f/u)   HPI [employer]. presents for chronic pain - seeing Dr Nelva Bush; had an MRI. C/o severe R leg pain. Pt lost wt - no appetite due to pain.... F/u RLS, B 12 def  Outpatient Medications Prior to Visit  Medication Sig Dispense Refill  . aspirin 81 MG EC tablet Take 1 tablet (81 mg total) by mouth daily. 100 tablet 3  . carbidopa-levodopa (SINEMET IR) 25-100 MG tablet For breakthrough restless leg, you can take 1 tablet at bedtime.  OK to take extra tab as needed during the day. 100 tablet 3  . Cholecalciferol 1000 UNITS tablet Take 3,000 Units by mouth daily.    . Cyanocobalamin (VITAMIN B-12) 1000 MCG SUBL DISSOLVE 2 TABLETS IN MOUTH EVERY DAY (Patient taking differently: 2 (two) times daily.) 60 tablet 8  . famotidine (PEPCID) 20 MG tablet Take 20 mg by mouth as needed for heartburn or indigestion. [facility] brand pepcid    . finasteride (PROSCAR) 5 MG tablet TAKE 1 TABLET BY MOUTH EVERY DAY 90 tablet 3  . gabapentin (NEURONTIN) 300 MG capsule TAKE 1 CAPSULE (300 MG TOTAL) BY MOUTH 4 (FOUR) TIMES DAILY. 360 capsule 1  . HYDROcodone-acetaminophen (NORCO) 7.5-325 MG tablet Take 1 tablet by mouth every 6 (six) hours as needed for severe pain. 20 tablet 0  . meclizine (ANTIVERT) 12.5 MG tablet TAKE 1 TABLET (12.5 MG TOTAL) BY MOUTH 3 (THREE) TIMES DAILY AS NEEDED FOR DIZZINESS. 60 tablet 1  . Multiple Vitamins-Minerals (PRESERVISION AREDS PO) Take 2 tablets by mouth 2 (two) times daily.    [name] oxycodone (OXY-IR) 5 MG capsule Take 5 mg by mouth every 6 (six) hours as needed.    . Oxycodone HCl 10 MG TABS oxycodone 10 mg tablet  Take 1 tablet 4 times a day by oral route as needed.    . pravastatin (PRAVACHOL) 20 MG tablet TAKE 1 TABLET BY MOUTH EVERY DAY (Patient taking differently: daily.) 90 tablet 1  . rOPINIRole (REQUIP) 2 MG tablet TAKE 1 TABLET BY MOUTH AT  12PM, 1 TABLET AT 4PM AND 1 TABLETS AT BEDTIME 360 tablet 1  . tamsulosin (FLOMAX) 0.4 MG CAPS capsule TAKE 1 CAPSULE BY MOUTH EVERY DAY 90 capsule 3  . terazosin (HYTRIN) 2 MG capsule TAKE 1 CAPSULE BY MOUTH EVERYDAY AT BEDTIME 90 capsule 3  . methylPREDNISolone (MEDROL DOSEPAK) 4 MG TBPK tablet As directed (Patient taking differently: As directed 10 mg bid finishes 07-09-2020 predinsone) 21 tablet 0  . Multiple Vitamin (MULTIVITAMIN) capsule Take 1 capsule by mouth daily.     No facility-administered medications prior to visit.    ROS: Review of Systems  Constitutional: Positive for unexpected weight change. Negative for appetite change and fatigue.  HENT: Negative for congestion, nosebleeds, sneezing, sore throat and trouble swallowing.   Eyes: Negative for itching and visual disturbance.  Respiratory: Negative for cough.   Cardiovascular: Negative for chest pain, palpitations and leg swelling.  Gastrointestinal: Negative for abdominal distention, blood in stool, diarrhea and nausea.  Genitourinary: Negative for frequency and hematuria.  Musculoskeletal: Positive for arthralgias, back pain and gait problem. Negative for joint swelling and neck pain.  Skin: Negative for rash.  Neurological: Negative for dizziness, tremors, speech difficulty and weakness.  Psychiatric/Behavioral: Positive for sleep disturbance. Negative for agitation and dysphoric mood. The patient is not nervous/anxious.  Objective:  BP (!) 146/84 (BP Location: Left Arm)   Pulse 84   Temp 98.2 F (36.8 C) (Oral)   Ht 6' (1.829 m)   Wt 179 lb (81.2 kg)   SpO2 98%   BMI 24.28 kg/m   BP Readings from Last 3 Encounters:  08/30/20 (!) 146/84  07/09/20 139/78  05/31/20 130/62    Wt Readings from Last 3 Encounters:  08/30/20 179 lb (81.2 kg)  07/09/20 196 lb 3.2 oz (89 kg)  05/31/20 206 lb 3.2 oz (93.5 kg)    Physical Exam Constitutional:      General: He is not in acute distress.    Appearance: He is  well-developed.     Comments: NAD  HENT:     Mouth/Throat:     Mouth: Oropharynx is clear and moist.  Eyes:     Conjunctiva/sclera: Conjunctivae normal.     Pupils: Pupils are equal, round, and reactive to light.  Neck:     Thyroid: No thyromegaly.     Vascular: No JVD.  Cardiovascular:     Rate and Rhythm: Normal rate and regular rhythm.     Pulses: Intact distal pulses.     Heart sounds: Normal heart sounds. No murmur heard. No friction rub. No gallop.   Pulmonary:     Effort: Pulmonary effort is normal. No respiratory distress.     Breath sounds: Normal breath sounds. No wheezing or rales.  Chest:     Chest wall: No tenderness.  Abdominal:     General: Bowel sounds are normal. There is no distension.     Palpations: Abdomen is soft. There is no mass.     Tenderness: There is no abdominal tenderness. There is no guarding or rebound.  Musculoskeletal:        General: Tenderness present. No edema. Normal range of motion.     Cervical back: Normal range of motion.  Lymphadenopathy:     Cervical: No cervical adenopathy.  Skin:    General: Skin is warm and dry.     Findings: No rash.  Neurological:     Mental Status: He is alert and oriented to person, place, and time.     Cranial Nerves: No cranial nerve deficit.     Motor: No abnormal muscle tone.     Coordination: He displays a negative Romberg sign. Coordination abnormal.     Gait: Gait abnormal.     Deep Tendon Reflexes: Reflexes are normal and symmetric.  Psychiatric:        Mood and Affect: Mood and affect normal.        Behavior: Behavior normal.        Thought Content: Thought content normal.        Judgment: Judgment normal.     using a cane R leg - pain in b anserina area LS w/pain  Lab Results  Component Value Date   WBC 5.1 10/16/2019   HGB 10.3 (L) 10/16/2019   HCT 30.0 (L) 10/16/2019   PLT 160.0 10/16/2019   GLUCOSE 94 10/27/2019   CHOL 136 01/12/2017   TRIG 67.0 01/12/2017   HDL 54.70  01/12/2017   LDLCALC 68 01/12/2017   ALT 13 10/16/2019   AST 21 10/16/2019   NA 139 10/27/2019   K 4.4 10/27/2019   CL 105 10/27/2019   CREATININE 1.21 10/27/2019   BUN 24 (H) 10/27/2019   CO2 28 10/27/2019   TSH 3.05 10/16/2019   PSA 0.47 08/30/2016   HGBA1C 5.6 07/28/2015  No results found.  Assessment & Plan:    [name], MD

## 2020-08-30 NOTE — Assessment & Plan Note (Signed)
Cont w/Requip, gabapentin

## 2020-08-30 NOTE — Assessment & Plan Note (Signed)
Severe R leg pain. Pt lost wt - no appetite due to pain.... Discussed

## 2020-08-30 NOTE — Assessment & Plan Note (Signed)
Pravastatin, baby aspirin

## 2020-08-30 NOTE — Assessment & Plan Note (Signed)
We treat RLS w/Requip, gabapentin

## 2020-08-31 DIAGNOSIS — Z85828 Personal history of other malignant neoplasm of skin: Secondary | ICD-10-CM | POA: Diagnosis not present

## 2020-08-31 DIAGNOSIS — L821 Other seborrheic keratosis: Secondary | ICD-10-CM | POA: Diagnosis not present

## 2020-08-31 DIAGNOSIS — D2371 Other benign neoplasm of skin of right lower limb, including hip: Secondary | ICD-10-CM | POA: Diagnosis not present

## 2020-08-31 DIAGNOSIS — D034 Melanoma in situ of scalp and neck: Secondary | ICD-10-CM | POA: Diagnosis not present

## 2020-08-31 DIAGNOSIS — L570 Actinic keratosis: Secondary | ICD-10-CM | POA: Diagnosis not present

## 2020-08-31 DIAGNOSIS — D1801 Hemangioma of skin and subcutaneous tissue: Secondary | ICD-10-CM | POA: Diagnosis not present

## 2020-08-31 DIAGNOSIS — D039 Melanoma in situ, unspecified: Secondary | ICD-10-CM | POA: Diagnosis not present

## 2020-08-31 DIAGNOSIS — D225 Melanocytic nevi of trunk: Secondary | ICD-10-CM | POA: Diagnosis not present

## 2020-08-31 DIAGNOSIS — D692 Other nonthrombocytopenic purpura: Secondary | ICD-10-CM | POA: Diagnosis not present

## 2020-09-01 DIAGNOSIS — M25561 Pain in right knee: Secondary | ICD-10-CM | POA: Diagnosis not present

## 2020-09-15 DIAGNOSIS — M25561 Pain in right knee: Secondary | ICD-10-CM | POA: Diagnosis not present

## 2020-09-20 ENCOUNTER — Ambulatory Visit: Payer: PPO | Admitting: Neurology

## 2020-09-20 DIAGNOSIS — D034 Melanoma in situ of scalp and neck: Secondary | ICD-10-CM | POA: Diagnosis not present

## 2020-09-20 DIAGNOSIS — D044 Carcinoma in situ of skin of scalp and neck: Secondary | ICD-10-CM | POA: Diagnosis not present

## 2020-09-20 DIAGNOSIS — D224 Melanocytic nevi of scalp and neck: Secondary | ICD-10-CM | POA: Diagnosis not present

## 2020-09-20 DIAGNOSIS — Z85828 Personal history of other malignant neoplasm of skin: Secondary | ICD-10-CM | POA: Diagnosis not present

## 2020-09-21 DIAGNOSIS — D224 Melanocytic nevi of scalp and neck: Secondary | ICD-10-CM | POA: Diagnosis not present

## 2020-09-21 DIAGNOSIS — D034 Melanoma in situ of scalp and neck: Secondary | ICD-10-CM | POA: Diagnosis not present

## 2020-09-21 DIAGNOSIS — D044 Carcinoma in situ of skin of scalp and neck: Secondary | ICD-10-CM | POA: Diagnosis not present

## 2020-09-30 DIAGNOSIS — M25561 Pain in right knee: Secondary | ICD-10-CM | POA: Diagnosis not present

## 2020-10-06 ENCOUNTER — Other Ambulatory Visit: Payer: Self-pay

## 2020-10-06 ENCOUNTER — Ambulatory Visit (INDEPENDENT_AMBULATORY_CARE_PROVIDER_SITE_OTHER): Payer: PPO | Admitting: Ophthalmology

## 2020-10-06 ENCOUNTER — Encounter (INDEPENDENT_AMBULATORY_CARE_PROVIDER_SITE_OTHER): Payer: Self-pay | Admitting: Ophthalmology

## 2020-10-06 DIAGNOSIS — H353111 Nonexudative age-related macular degeneration, right eye, early dry stage: Secondary | ICD-10-CM

## 2020-10-06 DIAGNOSIS — H353221 Exudative age-related macular degeneration, left eye, with active choroidal neovascularization: Secondary | ICD-10-CM | POA: Diagnosis not present

## 2020-10-06 MED ORDER — BEVACIZUMAB 2.5 MG/0.1ML IZ SOSY
2.5000 mg | PREFILLED_SYRINGE | INTRAVITREAL | Status: AC | PRN
  Administered 2020-10-06: 2.5 mg via INTRAVITREAL

## 2020-10-06 MED ORDER — AFLIBERCEPT 2MG/0.05ML IZ SOLN FOR KALEIDOSCOPE
2.0000 mg | INTRAVITREAL | Status: AC | PRN
Start: 2020-10-06 — End: 2020-10-06
  Administered 2020-10-06: 2 mg via INTRAVITREAL

## 2020-10-06 NOTE — Assessment & Plan Note (Signed)

## 2020-10-06 NOTE — Progress Notes (Signed)
10/06/2020     CHIEF COMPLAINT Patient presents for Retina Follow Up (8 Week Wet AMD f\u. Possible Eylea OS. OCT/Pt has not noticed any changes in vision. Denies new floaters and FOL.)   HISTORY OF PRESENT ILLNESS: Justin [name]. is a 80 y.o. male who presents to the clinic today for:   HPI    Retina Follow Up    Patient presents with  Wet AMD.  In left eye.  Severity is moderate.  Duration of 8 weeks.  Since onset it is stable.  I, the attending physician,  performed the HPI with the patient and updated documentation appropriately. Additional comments: 8 Week Wet AMD f\u. Possible Eylea OS. OCT Pt has not noticed any changes in vision. Denies new floaters and FOL.       Last edited by Tilda Franco on 10/06/2020  8:09 AM. (History)      Referring physician: [name], MD [place],  [place] [id number]  HISTORICAL INFORMATION:   Selected notes from the MEDICAL RECORD NUMBER    Lab Results  Component Value Date   HGBA1C 5.6 07/28/2015     CURRENT MEDICATIONS: No current outpatient medications on file. (Ophthalmic Drugs)   No current facility-administered medications for this visit. (Ophthalmic Drugs)   Current Outpatient Medications (Other)  Medication Sig  . aspirin 81 MG EC tablet Take 1 tablet (81 mg total) by mouth daily.  . carbidopa-levodopa (SINEMET IR) 25-100 MG tablet For breakthrough restless leg, you can take 1 tablet at bedtime.  OK to take extra tab as needed during the day.  . Cholecalciferol 1000 UNITS tablet Take 3,000 Units by mouth daily.  . Cyanocobalamin (VITAMIN B-12) 1000 MCG SUBL DISSOLVE 2 TABLETS IN MOUTH EVERY DAY (Patient taking differently: 2 (two) times daily.)  . famotidine (PEPCID) 20 MG tablet Take 20 mg by mouth as needed for heartburn or indigestion. [facility] brand pepcid  . finasteride (PROSCAR) 5 MG tablet TAKE 1 TABLET BY MOUTH EVERY DAY  . gabapentin (NEURONTIN) 300 MG capsule TAKE 1 CAPSULE (300 MG  TOTAL) BY MOUTH 4 (FOUR) TIMES DAILY.  [name] HYDROcodone-acetaminophen (NORCO) 7.5-325 MG tablet Take 1 tablet by mouth every 6 (six) hours as needed for severe pain.  . meclizine (ANTIVERT) 12.5 MG tablet TAKE 1 TABLET (12.5 MG TOTAL) BY MOUTH 3 (THREE) TIMES DAILY AS NEEDED FOR DIZZINESS.  . Multiple Vitamins-Minerals (PRESERVISION AREDS PO) Take 2 tablets by mouth 2 (two) times daily.  . Oxycodone HCl 10 MG TABS oxycodone 10 mg tablet  Take 1 tablet 4 times a day by oral route as needed.  . pravastatin (PRAVACHOL) 20 MG tablet TAKE 1 TABLET BY MOUTH EVERY DAY (Patient taking differently: daily.)  . rOPINIRole (REQUIP) 2 MG tablet TAKE 1 TABLET BY MOUTH AT 12PM, 1 TABLET AT 4PM AND 1 TABLETS AT BEDTIME  . tamsulosin (FLOMAX) 0.4 MG CAPS capsule TAKE 1 CAPSULE BY MOUTH EVERY DAY  . terazosin (HYTRIN) 2 MG capsule TAKE 1 CAPSULE BY MOUTH EVERYDAY AT BEDTIME   No current facility-administered medications for this visit. (Other)      REVIEW OF SYSTEMS:    ALLERGIES Allergies  Allergen Reactions  . Levofloxacin     REACTION: hands pealed    PAST MEDICAL HISTORY Past Medical History:  Diagnosis Date  . Alcoholism ([place])    Dry 13 years  . Basal cell carcinoma    Right Chest  . BPH (benign prostatic hyperplasia)   . Diverticulosis  of colon   . Dizzy spells    none recent  . DJD (degenerative joint disease)    lower back  . Elbow fracture, left 2009   [name]  . GERD (gastroesophageal reflux disease)   . Glaucoma   . History of TIAs 1 yrs ago  . Hyperkeratosis   . LBP (low back pain)   . Macular degeneration    left wet right dry sees [name]  . Melanoma [facility]) 2009   x3 [name]  . Osteoarthritis   . Restless leg syndrome   . Tinnitus   . Tobacco abuse   . Uses walker   . Wears glasses    reading  . Wears partial dentures    upper   Past Surgical History:  Procedure Laterality Date  . CATARACT EXTRACTION Bilateral 2018  . HARDWARE REMOVAL Left 07/09/2020    Procedure: Left elbow hardware removal;  Surgeon: [name], MD;  Location: [facility];  Service: Orthopedics;  Laterality: Left;  60 mins  . mda     Dr. [name] wet injection  . MELANOMA EXCISION  2009   x3  . RECONSTRUCTION MEDIAL COLLATERAL LIGAMENT ELBOW W/ TENDON GRAFT  2009   Left, [name]  . TONSILLECTOMY  as child    FAMILY HISTORY Family History  Problem Relation Age of Onset  . Cancer Mother        ?breast  . Cancer Father        Lung    SOCIAL HISTORY Social History   Tobacco Use  . Smoking status: Former Smoker    Packs/day: 3.00    Years: 38.00    Pack years: 114.00    Types: Cigarettes  . Smokeless tobacco: Never Used  . Tobacco comment: states he quit May 15 1985  Vaping Use  . Vaping Use: Never used  Substance Use Topics  . Alcohol use: No    Alcohol/week: 0.0 standard drinks    Comment: PREVIOUS - DRY 25 yrs  . Drug use: Not Currently         OPHTHALMIC EXAM:  Base Eye Exam    Visual Acuity (Snellen - Linear)      Right Left   Dist [place] 20/20 -2 20/80 -2   Dist ph   20/70 -2       Tonometry (Tonopen, 8:13 AM)      Right Left   Pressure 10 12       Pupils      Pupils Dark Light Shape React APD   Right PERRL 3 2 Round Slow None   Left PERRL 3 2 Round Slow None       Visual Fields (Counting fingers)      Left Right    Full Full       Neuro/Psych    Oriented x3: Yes   Mood/Affect: Normal       Dilation    Both eyes: 1.0% Mydriacyl, 2.5% Phenylephrine @ 8:13 AM        Slit Lamp and Fundus Exam    External Exam      Right Left   External Normal Normal       Slit Lamp Exam      Right Left   Lids/Lashes Normal Normal   Conjunctiva/Sclera White and quiet White and quiet   Cornea Clear Clear   Anterior Chamber Deep and quiet Deep and quiet   Iris Round and reactive Round and reactive   Lens  Posterior chamber intraocular lens Posterior chamber intraocular lens   Anterior Vitreous Normal Normal        Fundus Exam      Right Left   Posterior Vitreous Posterior vitreous detachment Posterior vitreous detachment   Disc Normal Normal   C/D Ratio 0.5 0.65   Macula Hard drusen, no exudates, no membrane, Retinal pigment epithelial mottling Retinal pigment epithelial detachment, Retinal pigment epithelial mottling, Drusen, Hard drusen, Atrophy,  intermediate age related macular degeneration, Geographic atrophy   Vessels  Normal   Periphery  Normal          IMAGING AND PROCEDURES  Imaging and Procedures for 10/06/20  OCT, Retina - OU - Both Eyes       Right Eye Quality was good. Scan locations included subfoveal. Central Foveal Thickness: 287. Progression has been stable. Findings include abnormal foveal contour.   Left Eye Quality was good. Scan locations included subfoveal. Central Foveal Thickness: 273. Progression has improved. Findings include pigment epithelial detachment, subretinal hyper-reflective material, no IRF, abnormal foveal contour, subretinal fluid.   Notes Much less active pigment epithelial detachment subfoveal OS with preserved and improved acuity Repeat intravitreal Eylea OS today at 8-week interval.       Intravitreal Injection, Pharmacologic Agent - OS - Left Eye       Time Out 10/06/2020. 9:01 AM. Confirmed correct patient, procedure, site, and patient consented.   Anesthesia Topical anesthesia was used. Anesthetic medications included Akten 3.5%.   Procedure Preparation included Tobramycin 0.3%, 5% betadine to ocular surface, 10% betadine to eyelids. A 30 gauge needle was used.   Injection:  2.5 mg Bevacizumab (AVASTIN) 2.5mg /0.21mL SOSY   NDC: 76195-093-26, Lot: [id number]   Route: Intravitreal, Site: Left Eye  Post-op Post injection exam found visual acuity of at least counting fingers. The patient tolerated the procedure well. There were no complications. The patient received written and verbal post procedure care education. Post injection  medications were not given.        Intravitreal Injection, Pharmacologic Agent - OS - Left Eye       Time Out 10/06/2020. 9:05 AM. Confirmed correct patient, procedure, site, and patient consented.   Anesthesia Topical anesthesia was used. Anesthetic medications included Akten 3.5%.   Procedure Preparation included Tobramycin 0.3%, 5% betadine to ocular surface, 10% betadine to eyelids. A 30 gauge needle was used.   Injection:  2 mg aflibercept Alfonse Flavors) SOLN   NDC: A3590391, Lot: [id number]   Route: Intravitreal, Site: Left Eye, Waste: 0 mg  Post-op Post injection exam found visual acuity of at least counting fingers. The patient tolerated the procedure well. There were no complications. The patient received written and verbal post procedure care education. Post injection medications were not given.                 ASSESSMENT/PLAN:  Exudative age-related macular degeneration of left eye with active choroidal neovascularization (HCC) The nature of wet macular degeneration was discussed with the patient.  Forms of therapy reviewed include the use of Anti-VEGF medications injected painlessly into the eye, as well as other possible treatment modalities, including thermal laser therapy. Fellow eye involvement and risks were discussed with the patient. Upon the finding of wet age related macular degeneration, treatment will be offered. The treatment regimen is on a treat as needed basis with the intent to treat if necessary and extend interval of exams when possible. On average 1 out of 6 patients do not need lifetime therapy. However, the risk of  recurrent disease is high for a lifetime.  Initially monthly, then periodic, examinations and evaluations will determine whether the next treatment is required on the day of the examination.  Early stage nonexudative age-related macular degeneration of right eye No signs of CNVM      ICD-10-CM   1. Exudative age-related macular  degeneration of left eye with active choroidal neovascularization (HCC)  H35.3221 OCT, Retina - OU - Both Eyes    Intravitreal Injection, Pharmacologic Agent - OS - Left Eye    bevacizumab (AVASTIN) SOSY 2.5 mg    Intravitreal Injection, Pharmacologic Agent - OS - Left Eye    aflibercept (EYLEA) SOLN 2 mg  2. Early stage nonexudative age-related macular degeneration of right eye  H35.3111     1.  Much improved macular findings left eye with less SR hemorrhage yet still with persistent subretinal fluid.  We will repeat injection Eylea today at 8-week follow-up and continue with 8-week follow-up  2.  Vitreal Eylea OS delivered today  3.  Please note clerical error in the notations of the procedures noted earlier.  Bevacizumab is not used in the left eye today  Ophthalmic Meds Ordered this visit:  Meds ordered this encounter  Medications  . bevacizumab (AVASTIN) SOSY 2.5 mg  . aflibercept (EYLEA) SOLN 2 mg       Return in about 8 weeks (around 12/01/2020) for dilate, OS, EYLEA OCT.  There are no Patient Instructions on file for this visit.   Explained the diagnoses, plan, and follow up with the patient and they expressed understanding.  Patient expressed understanding of the importance of proper follow up care.   Clent Demark Arnika Larzelere M.D. Diseases & Surgery of the Retina and Vitreous Retina & Diabetic [place] 10/06/20     Abbreviations: M myopia (nearsighted); A astigmatism; H hyperopia (farsighted); P presbyopia; Mrx spectacle prescription;  CTL contact lenses; OD right eye; OS left eye; OU both eyes  XT exotropia; ET esotropia; PEK punctate epithelial keratitis; PEE punctate epithelial erosions; DES dry eye syndrome; MGD meibomian gland dysfunction; ATs artificial tears; PFAT's preservative free artificial tears; [place] nuclear sclerotic cataract; PSC posterior subcapsular cataract; ERM epi-retinal membrane; PVD posterior vitreous detachment; RD retinal detachment; DM diabetes mellitus;  DR diabetic retinopathy; NPDR non-proliferative diabetic retinopathy; PDR proliferative diabetic retinopathy; CSME clinically significant macular edema; DME diabetic macular edema; dbh dot blot hemorrhages; CWS cotton wool spot; POAG primary open angle glaucoma; C/D cup-to-disc ratio; HVF humphrey visual field; GVF goldmann visual field; OCT optical coherence tomography; IOP intraocular pressure; BRVO Branch retinal vein occlusion; CRVO central retinal vein occlusion; CRAO central retinal artery occlusion; BRAO branch retinal artery occlusion; RT retinal tear; SB scleral buckle; PPV pars plana vitrectomy; VH Vitreous hemorrhage; PRP panretinal laser photocoagulation; IVK intravitreal kenalog; VMT vitreomacular traction; MH Macular hole;  NVD neovascularization of the disc; NVE neovascularization elsewhere; AREDS age related eye disease study; ARMD age related macular degeneration; POAG primary open angle glaucoma; EBMD epithelial/anterior basement membrane dystrophy; ACIOL anterior chamber intraocular lens; IOL intraocular lens; PCIOL posterior chamber intraocular lens; Phaco/IOL phacoemulsification with intraocular lens placement; [place] photorefractive keratectomy; LASIK laser assisted in situ keratomileusis; HTN hypertension; DM diabetes mellitus; COPD chronic obstructive pulmonary disease

## 2020-10-06 NOTE — Assessment & Plan Note (Signed)
No signs of CNVM

## 2020-10-07 DIAGNOSIS — M1711 Unilateral primary osteoarthritis, right knee: Secondary | ICD-10-CM | POA: Diagnosis not present

## 2020-10-07 DIAGNOSIS — H1131 Conjunctival hemorrhage, right eye: Secondary | ICD-10-CM | POA: Diagnosis not present

## 2020-10-20 ENCOUNTER — Other Ambulatory Visit: Payer: Self-pay

## 2020-10-20 ENCOUNTER — Encounter: Payer: Self-pay | Admitting: Internal Medicine

## 2020-10-20 ENCOUNTER — Ambulatory Visit (INDEPENDENT_AMBULATORY_CARE_PROVIDER_SITE_OTHER): Payer: PPO | Admitting: Internal Medicine

## 2020-10-20 DIAGNOSIS — E559 Vitamin D deficiency, unspecified: Secondary | ICD-10-CM | POA: Diagnosis not present

## 2020-10-20 DIAGNOSIS — M1990 Unspecified osteoarthritis, unspecified site: Secondary | ICD-10-CM

## 2020-10-20 DIAGNOSIS — M25561 Pain in right knee: Secondary | ICD-10-CM

## 2020-10-20 DIAGNOSIS — G2581 Restless legs syndrome: Secondary | ICD-10-CM | POA: Diagnosis not present

## 2020-10-20 DIAGNOSIS — R634 Abnormal weight loss: Secondary | ICD-10-CM | POA: Diagnosis not present

## 2020-10-20 DIAGNOSIS — E538 Deficiency of other specified B group vitamins: Secondary | ICD-10-CM | POA: Diagnosis not present

## 2020-10-20 DIAGNOSIS — I10 Essential (primary) hypertension: Secondary | ICD-10-CM | POA: Diagnosis not present

## 2020-10-20 DIAGNOSIS — Z01818 Encounter for other preprocedural examination: Secondary | ICD-10-CM | POA: Diagnosis not present

## 2020-10-20 DIAGNOSIS — G8929 Other chronic pain: Secondary | ICD-10-CM | POA: Diagnosis not present

## 2020-10-20 DIAGNOSIS — I2510 Atherosclerotic heart disease of native coronary artery without angina pectoris: Secondary | ICD-10-CM | POA: Diagnosis not present

## 2020-10-20 NOTE — Assessment & Plan Note (Signed)
On B12 

## 2020-10-20 NOTE — Assessment & Plan Note (Signed)
Vit D 

## 2020-10-20 NOTE — Assessment & Plan Note (Signed)
Cont w/Requip, gabapentin, Sinemet

## 2020-10-20 NOTE — Assessment & Plan Note (Signed)
Planning to have a R TKR soon - May 10 (Dr Alvan Dame)

## 2020-10-20 NOTE — Assessment & Plan Note (Signed)
On Pravastatin, ASA 

## 2020-10-20 NOTE — Assessment & Plan Note (Signed)
On Oxy - Dr Nelva Bush is managing

## 2020-10-20 NOTE — Progress Notes (Addendum)
Subjective:  Patient ID: Justin Pagan Su Grand., male    DOB: [birth year]  Age: 80 y.o. MRN: [id number]  CC: Medical Clearance ((R) Knee Replacement)   HPI [name] presents for OA - bad R knee now; RLS, B12 def.  Planning to have a R TKR soon - May 10 (Dr Alvan Dame)  Past Medical History:  Diagnosis Date  . Alcoholism ([place])    Dry 13 years  . Basal cell carcinoma    Right Chest  . BPH (benign prostatic hyperplasia)   . Diverticulosis of colon   . Dizzy spells    none recent  . DJD (degenerative joint disease)    lower back  . Elbow fracture, left 2009   [name]  . GERD (gastroesophageal reflux disease)   . Glaucoma   . History of TIAs 1 yrs ago  . Hyperkeratosis   . LBP (low back pain)   . Macular degeneration    left wet right dry sees [name]  . Melanoma [facility]) 2009   x3 [name]  . Osteoarthritis   . Restless leg syndrome   . Tinnitus   . Tobacco abuse   . Uses walker   . Wears glasses    reading  . Wears partial dentures    upper   Past Surgical History:  Procedure Laterality Date  . CATARACT EXTRACTION Bilateral 2018  . HARDWARE REMOVAL Left 07/09/2020   Procedure: Left elbow hardware removal;  Surgeon: [name], MD;  Location: [facility];  Service: Orthopedics;  Laterality: Left;  60 mins  . mda     Dr. [name] wet injection  . MELANOMA EXCISION  2009   x3  . RECONSTRUCTION MEDIAL COLLATERAL LIGAMENT ELBOW W/ TENDON GRAFT  2009   Left, [name]  . TONSILLECTOMY  as child    reports that he has quit smoking. His smoking use included cigarettes. He has a 114.00 pack-year smoking history. He has never used smokeless tobacco. He reports previous drug use. He reports that he does not drink alcohol. family history includes Cancer in his father and mother. Allergies  Allergen Reactions  . Levofloxacin     REACTION: hands pealed     Outpatient Medications Prior to Visit  Medication Sig Dispense Refill  .  aspirin 81 MG EC tablet Take 1 tablet (81 mg total) by mouth daily. 100 tablet 3  . carbidopa-levodopa (SINEMET IR) 25-100 MG tablet For breakthrough restless leg, you can take 1 tablet at bedtime.  OK to take extra tab as needed during the day. 100 tablet 3  . Cholecalciferol 1000 UNITS tablet Take 3,000 Units by mouth daily.    . Cyanocobalamin (VITAMIN B-12) 1000 MCG SUBL DISSOLVE 2 TABLETS IN MOUTH EVERY DAY (Patient taking differently: 2 (two) times daily.) 60 tablet 8  . famotidine (PEPCID) 20 MG tablet Take 20 mg by mouth as needed for heartburn or indigestion. [facility] brand pepcid    . finasteride (PROSCAR) 5 MG tablet TAKE 1 TABLET BY MOUTH EVERY DAY 90 tablet 3  . gabapentin (NEURONTIN) 300 MG capsule TAKE 1 CAPSULE (300 MG TOTAL) BY MOUTH 4 (FOUR) TIMES DAILY. 360 capsule 1  . HYDROcodone-acetaminophen (NORCO) 7.5-325 MG tablet Take 1 tablet by mouth every 6 (six) hours as needed for severe pain. 20 tablet 0  . meclizine (ANTIVERT) 12.5 MG tablet TAKE 1 TABLET (12.5 MG TOTAL) BY MOUTH 3 (THREE) TIMES DAILY AS NEEDED FOR DIZZINESS. 60 tablet 1  .  Multiple Vitamins-Minerals (PRESERVISION AREDS PO) Take 2 tablets by mouth 2 (two) times daily.    . Oxycodone HCl 10 MG TABS oxycodone 10 mg tablet  Take 1 tablet 4 times a day by oral route as needed.    . pravastatin (PRAVACHOL) 20 MG tablet TAKE 1 TABLET BY MOUTH EVERY DAY (Patient taking differently: daily.) 90 tablet 1  . rOPINIRole (REQUIP) 2 MG tablet TAKE 1 TABLET BY MOUTH AT 12PM, 1 TABLET AT 4PM AND 1 TABLETS AT BEDTIME 360 tablet 1  . tamsulosin (FLOMAX) 0.4 MG CAPS capsule TAKE 1 CAPSULE BY MOUTH EVERY DAY 90 capsule 3  . terazosin (HYTRIN) 2 MG capsule TAKE 1 CAPSULE BY MOUTH EVERYDAY AT BEDTIME 90 capsule 3   No facility-administered medications prior to visit.    ROS: Review of Systems  Constitutional: Negative for appetite change, fatigue and unexpected weight change.  HENT: Negative for congestion, nosebleeds, sneezing, sore  throat and trouble swallowing.   Eyes: Negative for itching and visual disturbance.  Respiratory: Negative for cough.   Cardiovascular: Negative for chest pain, palpitations and leg swelling.  Gastrointestinal: Negative for abdominal distention, blood in stool, diarrhea and nausea.  Genitourinary: Negative for frequency and hematuria.  Musculoskeletal: Positive for arthralgias, back pain and gait problem. Negative for joint swelling and neck pain.  Skin: Negative for rash.  Neurological: Negative for dizziness, tremors, speech difficulty and weakness.  Psychiatric/Behavioral: Negative for agitation, dysphoric mood and sleep disturbance. The patient is not nervous/anxious.     Objective:  BP 140/62 (BP Location: Left Arm)   Pulse 85   Temp 98 F (36.7 C) (Oral)   Ht 6' (1.829 m)   Wt 180 lb 6.4 oz (81.8 kg)   SpO2 97%   BMI 24.47 kg/m   BP Readings from Last 3 Encounters:  10/20/20 140/62  08/30/20 (!) 146/84  07/09/20 139/78    Wt Readings from Last 3 Encounters:  10/20/20 180 lb 6.4 oz (81.8 kg)  08/30/20 179 lb (81.2 kg)  07/09/20 196 lb 3.2 oz (89 kg)    Physical Exam Constitutional:      General: He is not in acute distress.    Appearance: He is well-developed.     Comments: NAD  Eyes:     Conjunctiva/sclera: Conjunctivae normal.     Pupils: Pupils are equal, round, and reactive to light.  Neck:     Thyroid: No thyromegaly.     Vascular: No JVD.  Cardiovascular:     Rate and Rhythm: Normal rate and regular rhythm.     Heart sounds: Normal heart sounds. No murmur heard. No friction rub. No gallop.   Pulmonary:     Effort: Pulmonary effort is normal. No respiratory distress.     Breath sounds: Normal breath sounds. No wheezing or rales.  Chest:     Chest wall: No tenderness.  Abdominal:     General: Bowel sounds are normal. There is no distension.     Palpations: Abdomen is soft. There is no mass.     Tenderness: There is no abdominal tenderness. There is  no guarding or rebound.  Musculoskeletal:        General: Tenderness present. Normal range of motion.     Cervical back: Normal range of motion.  Lymphadenopathy:     Cervical: No cervical adenopathy.  Skin:    General: Skin is warm and dry.     Findings: No rash.  Neurological:     Mental Status: He is alert and  oriented to person, place, and time.     Cranial Nerves: No cranial nerve deficit.     Motor: No abnormal muscle tone.     Coordination: Coordination abnormal.     Gait: Gait abnormal.     Deep Tendon Reflexes: Reflexes are normal and symmetric.  Psychiatric:        Behavior: Behavior normal.        Thought Content: Thought content normal.        Judgment: Judgment normal.   R knee w/pain on ROM, varus knees Using a walker   Lab Results  Component Value Date   WBC 5.1 10/16/2019   HGB 10.3 (L) 10/16/2019   HCT 30.0 (L) 10/16/2019   PLT 160.0 10/16/2019   GLUCOSE 94 10/27/2019   CHOL 136 01/12/2017   TRIG 67.0 01/12/2017   HDL 54.70 01/12/2017   LDLCALC 68 01/12/2017   ALT 13 10/16/2019   AST 21 10/16/2019   NA 139 10/27/2019   K 4.4 10/27/2019   CL 105 10/27/2019   CREATININE 1.21 10/27/2019   BUN 24 (H) 10/27/2019   CO2 28 10/27/2019   TSH 3.05 10/16/2019   PSA 0.47 08/30/2016   HGBA1C 5.6 07/28/2015    No results found.  Assessment & Plan:     Follow-up: No follow-ups on file.  [name], MD

## 2020-10-20 NOTE — Assessment & Plan Note (Signed)
On Hytrin

## 2020-10-20 NOTE — Assessment & Plan Note (Signed)
Wt Readings from Last 3 Encounters:  10/20/20 180 lb 6.4 oz (81.8 kg)  08/30/20 179 lb (81.2 kg)  07/09/20 196 lb 3.2 oz (89 kg)

## 2020-10-21 DIAGNOSIS — Z01818 Encounter for other preprocedural examination: Secondary | ICD-10-CM | POA: Insufficient documentation

## 2020-10-21 NOTE — Assessment & Plan Note (Signed)
Mr Sallade is medically clear for his upcoming total knee replacement by Dr. Alvan Dame on 02/09/2021, assuming that she is preop labs are acceptable.  Thank you.

## 2020-10-22 DIAGNOSIS — M1711 Unilateral primary osteoarthritis, right knee: Secondary | ICD-10-CM | POA: Diagnosis not present

## 2020-10-22 DIAGNOSIS — M25522 Pain in left elbow: Secondary | ICD-10-CM | POA: Diagnosis not present

## 2020-10-22 DIAGNOSIS — M25561 Pain in right knee: Secondary | ICD-10-CM | POA: Diagnosis not present

## 2020-10-26 NOTE — Patient Instructions (Addendum)
DUE TO COVID-19 ONLY ONE VISITOR IS ALLOWED TO COME WITH YOU AND STAY IN THE WAITING ROOM ONLY DURING PRE OP AND PROCEDURE DAY OF SURGERY. THE 2 VISITORS  MAY VISIT WITH YOU AFTER SURGERY IN YOUR PRIVATE ROOM DURING VISITING HOURS ONLY!  YOU NEED TO HAVE A COVID 19 TEST ON   5/6___ @__11 :05_____, THIS TEST MUST BE DONE BEFORE SURGERY,  COVID TESTING SITE [place] [address], IT IS ON THE RIGHT GOING OUT [address] APPROXIMATELY  2 MINUTES PAST [employer] ON THE RIGHT. ONCE YOUR COVID TEST IS COMPLETED,  PLEASE BEGIN THE QUARANTINE INSTRUCTIONS AS OUTLINED IN YOUR HANDOUT.                [place]    Your procedure is scheduled on: 11/09/20   Report to [facility] Main  Entrance   Report to admitting at  10:25 AM     Call this number if you have problems the morning of surgery [phone]   . BRUSH YOUR TEETH MORNING OF SURGERY AND RINSE YOUR MOUTH OUT, NO CHEWING GUM CANDY OR MINTS.   No food after midnight.    You may have clear liquid until 9:30 AM.    At 9:00 AM drink pre surgery drink  . Nothing by mouth after 9:30 AM.   Take these medicines the morning of surgery with A SIP OF WATER: Gabapentin, Sinemet, Pepcid, Ropinirole, Finasteride, Tamsulosin                                 You may not have any metal on your body including              piercings  Do not wear jewelry,  lotions, powders or deodorant              Men may shave face and neck.   Do not bring valuables to the hospital. [place].  Contacts, dentures or bridgework may not be worn into surgery.                 Please read over the following fact sheets you were given: _____________________________________________________________________             [facility] - Preparing for Surgery Before surgery, you can play an important role.  Because skin is not sterile, your skin needs to be as free of  germs as possible.  You can reduce the number of germs on your skin by washing with CHG (chlorahexidine gluconate) soap before surgery.  CHG is an antiseptic cleaner which kills germs and bonds with the skin to continue killing germs even after washing. Please DO NOT use if you have an allergy to CHG or antibacterial soaps.  If your skin becomes reddened/irritated stop using the CHG and inform your nurse when you arrive at Short Stay. .  You may shave your face/neck.  Please follow these instructions carefully:  1.  Shower with CHG Soap the night before surgery and the  morning of Surgery.  2.  If you choose to wash your hair, wash your hair first as usual with your  normal  shampoo.  3.  After you shampoo, rinse your hair and body thoroughly to remove the  shampoo.  4.  Use CHG as you would any other liquid soap.  You can apply chg directly  to the skin and wash                       Gently with a scrungie or clean washcloth.  5.  Apply the CHG Soap to your body ONLY FROM THE NECK DOWN.   Do not use on face/ open                           Wound or open sores. Avoid contact with eyes, ears mouth and genitals (private parts).                       Wash face,  Genitals (private parts) with your normal soap.             6.  Wash thoroughly, paying special attention to the area where your surgery  will be performed.  7.  Thoroughly rinse your body with warm water from the neck down.  8.  DO NOT shower/wash with your normal soap after using and rinsing off  the CHG Soap.             9.  Pat yourself dry with a clean towel.            10.  Wear clean pajamas.            11.  Place clean sheets on your bed the night of your first shower and do not  sleep with pets. Day of Surgery : Do not apply any lotions/deodorants the morning of surgery.  Please wear clean clothes to the hospital/surgery center.  FAILURE TO FOLLOW THESE INSTRUCTIONS MAY RESULT IN THE  CANCELLATION OF YOUR SURGERY PATIENT SIGNATURE_________________________________  NURSE SIGNATURE__________________________________  ________________________________________________________________________   Adam Phenix  An incentive spirometer is a tool that can help keep your lungs clear and active. This tool measures how well you are filling your lungs with each breath. Taking long deep breaths may help reverse or decrease the chance of developing breathing (pulmonary) problems (especially infection) following:  A long period of time when you are unable to move or be active. BEFORE THE PROCEDURE   If the spirometer includes an indicator to show your best effort, your nurse or respiratory therapist will set it to a desired goal.  If possible, sit up straight or lean slightly forward. Try not to slouch.  Hold the incentive spirometer in an upright position. INSTRUCTIONS FOR USE  1. Sit on the edge of your bed if possible, or sit up as far as you can in bed or on a chair. 2. Hold the incentive spirometer in an upright position. 3. Breathe out normally. 4. Place the mouthpiece in your mouth and seal your lips tightly around it. 5. Breathe in slowly and as deeply as possible, raising the piston or the ball toward the top of the column. 6. Hold your breath for 3-5 seconds or for as long as possible. Allow the piston or ball to fall to the bottom of the column. 7. Remove the mouthpiece from your mouth and breathe out normally. 8. Rest for a few seconds and repeat Steps 1 through 7 at least 10 times every 1-2 hours when you are awake. Take your time and take a few normal breaths between deep breaths. 9. The spirometer may include an indicator to show your best effort.  Use the indicator as a goal to work toward during each repetition. 10. After each set of 10 deep breaths, practice coughing to be sure your lungs are clear. If you have an incision (the cut made at the time of surgery),  support your incision when coughing by placing a pillow or rolled up towels firmly against it. Once you are able to get out of bed, walk around indoors and cough well. You may stop using the incentive spirometer when instructed by your caregiver.  RISKS AND COMPLICATIONS  Take your time so you do not get dizzy or light-headed.  If you are in pain, you may need to take or ask for pain medication before doing incentive spirometry. It is harder to take a deep breath if you are having pain. AFTER USE  Rest and breathe slowly and easily.  It can be helpful to keep track of a log of your progress. Your caregiver can provide you with a simple table to help with this. If you are using the spirometer at home, follow these instructions: [place] IF:   You are having difficultly using the spirometer.  You have trouble using the spirometer as often as instructed.  Your pain medication is not giving enough relief while using the spirometer.  You develop fever of 100.5 F (38.1 C) or higher. SEEK IMMEDIATE MEDICAL CARE IF:   You cough up bloody sputum that had not been present before.  You develop fever of 102 F (38.9 C) or greater.  You develop worsening pain at or near the incision site. MAKE SURE YOU:   Understand these instructions.  Will watch your condition.  Will get help right away if you are not doing well or get worse. Document Released: 10/30/2006 Document Revised: 09/11/2011 Document Reviewed: 12/31/2006 [facility] Patient Information 2014 [place], [place].   ________________________________________________________________________

## 2020-10-28 ENCOUNTER — Other Ambulatory Visit: Payer: Self-pay

## 2020-10-28 ENCOUNTER — Encounter (HOSPITAL_COMMUNITY): Payer: Self-pay

## 2020-10-28 ENCOUNTER — Encounter (HOSPITAL_COMMUNITY)
Admission: RE | Admit: 2020-10-28 | Discharge: 2020-10-28 | Disposition: A | Discharge status: Home / Self Care | Payer: PPO | Source: Ambulatory Visit | Attending: Orthopedic Surgery | Admitting: Orthopedic Surgery

## 2020-10-28 DIAGNOSIS — M1711 Unilateral primary osteoarthritis, right knee: Secondary | ICD-10-CM | POA: Diagnosis not present

## 2020-10-28 DIAGNOSIS — Z01818 Encounter for other preprocedural examination: Secondary | ICD-10-CM | POA: Diagnosis not present

## 2020-10-28 DIAGNOSIS — Z8673 Personal history of transient ischemic attack (TIA), and cerebral infarction without residual deficits: Secondary | ICD-10-CM | POA: Insufficient documentation

## 2020-10-28 DIAGNOSIS — Z7982 Long term (current) use of aspirin: Secondary | ICD-10-CM | POA: Insufficient documentation

## 2020-10-28 DIAGNOSIS — K219 Gastro-esophageal reflux disease without esophagitis: Secondary | ICD-10-CM | POA: Insufficient documentation

## 2020-10-28 DIAGNOSIS — Z87891 Personal history of nicotine dependence: Secondary | ICD-10-CM | POA: Insufficient documentation

## 2020-10-28 DIAGNOSIS — Z79899 Other long term (current) drug therapy: Secondary | ICD-10-CM | POA: Insufficient documentation

## 2020-10-28 LAB — SURGICAL PCR SCREEN
MRSA, PCR: NEGATIVE
Staphylococcus aureus: POSITIVE — AB

## 2020-10-28 LAB — COMPREHENSIVE METABOLIC PANEL
ALT: 10 U/L (ref 0–44)
AST: 19 U/L (ref 15–41)
Albumin: 4 g/dL (ref 3.5–5.0)
Alkaline Phosphatase: 60 U/L (ref 38–126)
Anion gap: 7 (ref 5–15)
BUN: 18 mg/dL (ref 8–23)
CO2: 25 mmol/L (ref 22–32)
Calcium: 9.2 mg/dL (ref 8.9–10.3)
Chloride: 107 mmol/L (ref 98–111)
Creatinine, Ser: 1.01 mg/dL (ref 0.61–1.24)
GFR, Estimated: >60 mL/min (ref >60)
Glucose, Bld: 101 mg/dL — ABNORMAL HIGH (ref 70–99)
Potassium: 4.1 mmol/L (ref 3.5–5.1)
Sodium: 139 mmol/L (ref 135–145)
Total Bilirubin: 0.8 mg/dL (ref 0.3–1.2)
Total Protein: 6.9 g/dL (ref 6.5–8.1)

## 2020-10-28 LAB — CBC
HCT: 27.4 % — ABNORMAL LOW (ref 39.0–52.0)
Hemoglobin: 9 g/dL — ABNORMAL LOW (ref 13.0–17.0)
MCH: 31 pg (ref 26.0–34.0)
MCHC: 32.8 g/dL (ref 30.0–36.0)
MCV: 94.5 fL (ref 80.0–100.0)
Platelets: 137 10*3/uL — ABNORMAL LOW (ref 150–400)
RBC: 2.9 MIL/uL — ABNORMAL LOW (ref 4.22–5.81)
RDW: 13.8 % (ref 11.5–15.5)
WBC: 3 10*3/uL — ABNORMAL LOW (ref 4.0–10.5)
nRBC: 0 % (ref 0.0–0.2)

## 2020-10-28 LAB — TYPE AND SCREEN
ABO/RH(D): O NEG
Antibody Screen: NEGATIVE

## 2020-10-28 LAB — PROTIME-INR
INR: 1.1 (ref 0.8–1.2)
Prothrombin Time: 14.3 seconds (ref 11.4–15.2)

## 2020-10-28 LAB — APTT: aPTT: 35 seconds (ref 24–36)

## 2020-10-28 NOTE — Progress Notes (Signed)
COVID Vaccine Completed:Yes Date COVID Vaccine completed:09/07/19-booster 04/21/20 COVID vaccine manufacturer: Pfizer      PCP - Dr. Loni Muse Plotnikov Cardiologist - none  Chest x-ray - no EKG - 10/28/20-epic,chart Stress Test - 08/08/17-epic ECHO - 10/22/19-epic Cardiac Cath - no Pacemaker/ICD device last checked:NA  Sleep Study - no CPAP -   Fasting Blood Sugar - NA Checks Blood Sugar _____ times a day  Blood Thinner Instructions:NA Aspirin Instructions: Last Dose:  Anesthesia review:   Patient denies shortness of breath, fever, cough and chest pain at PAT appointment Yes. No SOB with any activities. Pt uses a walker at home  Patient verbalized understanding of instructions that were given to them at the PAT appointment. Patient was also instructed that they will need to review over the PAT instructions again at home before surgery.Yes

## 2020-11-01 NOTE — Progress Notes (Signed)
Anesthesia Chart Review   Case: [id number] Date/Time: 11/09/20 1240   Procedure: TOTAL KNEE ARTHROPLASTY (Right Knee)   Anesthesia type: Spinal   Pre-op diagnosis: Right knee osteoarthritis   Location: [place] 09 / [facility] ORS   Surgeons: [name], MD      DISCUSSION:80 y.o. former smoker with h/o GERD, Stroke, BPH, right knee OA scheduled for above procedure 11/09/2020 with Dr. [name].   Pt last seen by PCP 10/21/2020. Per OV note, "Justin [name] is medically clear for his upcoming total knee replacement by Dr. Alvan Dame on 02/09/2021, assuming that she is preop labs are acceptable."  Hemoglobin 9.0 at PAT visit 10/28/2020. Message left with Dr. Aurea Graff office.  VS: BP (!) 134/58   Pulse 85   Temp 36.7 C   Resp 20   Ht 6' (1.829 m)   Wt 80.3 kg   SpO2 97%   BMI 24.01 kg/m   PROVIDERS: Plotnikov, Evie Lacks, MD is PCP    LABS: forwarded to surgeon (all labs ordered are listed, but only abnormal results are displayed)  Labs Reviewed  SURGICAL PCR SCREEN - Abnormal; Notable for the following components:      Result Value   Staphylococcus aureus POSITIVE (*)    All other components within normal limits  CBC - Abnormal; Notable for the following components:   WBC 3.0 (*)    RBC 2.90 (*)    Hemoglobin 9.0 (*)    HCT 27.4 (*)    Platelets 137 (*)    All other components within normal limits  COMPREHENSIVE METABOLIC PANEL - Abnormal; Notable for the following components:   Glucose, Bld 101 (*)    All other components within normal limits  PROTIME-INR  APTT  TYPE AND SCREEN     IMAGES:   EKG: 10/28/2020 Rate 71 bpm  NSR  CV: Echo 10/22/2019 1. Left ventricular ejection fraction, by estimation, is 60 to 65%. The  left ventricle has normal function. The left ventricle has no regional  wall motion abnormalities. There is mild concentric left ventricular  hypertrophy. Left ventricular diastolic  parameters are consistent with Grade II diastolic dysfunction   (pseudonormalization).  2. Right ventricular systolic function is normal. The right ventricular  size is normal. There is mildly elevated pulmonary artery systolic  pressure.  3. Left atrial size was moderately dilated.  4. Right atrial size was mildly dilated.  5. The mitral valve is normal in structure. Trivial mitral valve  regurgitation. No evidence of mitral stenosis.  6. The aortic valve is tricuspid. Aortic valve regurgitation is not  visualized. No aortic stenosis is present.  7. The inferior vena cava is normal in size with greater than 50%  respiratory variability, suggesting right atrial pressure of 3 mmHg.   Stress Test 08/08/2017  Nuclear stress EF: 60%. There were no significant wall motion abnormalities.  There was no ST segment deviation noted during stress.  Defect 1: There is a medium defect of mild severity present in the basal inferior, mid inferolateral, apical inferior and apical lateral location. This defect is consistent with diaphragmatic attenuation artifact.  This is a low risk study. No significant ischemia identified.   Past Medical History:  Diagnosis Date  . Alcoholism ([place])    Dry 13 years  . Basal cell carcinoma    Right Chest  . BPH (benign prostatic hyperplasia)   . Diverticulosis of colon   . Dizzy spells    none recent  . DJD (degenerative joint disease)  lower back  . Elbow fracture, left 2009   [name]  . GERD (gastroesophageal reflux disease)   . Glaucoma   . History of TIAs 1 yrs ago  . Hyperkeratosis   . LBP (low back pain)   . Macular degeneration    left wet right dry sees [name]  . Melanoma [facility]) 2009   x3 [name]  . Osteoarthritis   . Restless leg syndrome   . Stroke [facility]) 2005   TIA  . Tinnitus   . Tobacco abuse   . Uses walker   . Wears glasses    reading  . Wears partial dentures    upper    Past Surgical History:  Procedure Laterality Date  . CATARACT EXTRACTION Bilateral 2018  .  HARDWARE REMOVAL Left 07/09/2020   Procedure: Left elbow hardware removal;  Surgeon: [name], MD;  Location: [facility];  Service: Orthopedics;  Laterality: Left;  60 mins  . mda     Dr. [name] wet injection  . MELANOMA EXCISION  2009   x3  . RECONSTRUCTION MEDIAL COLLATERAL LIGAMENT ELBOW W/ TENDON GRAFT  2009   Left, [name]  . TONSILLECTOMY  as child    MEDICATIONS: . aspirin 81 MG EC tablet  . carbidopa-levodopa (SINEMET IR) 25-100 MG tablet  . Cholecalciferol 1000 UNITS tablet  . Cyanocobalamin (VITAMIN B-12) 1000 MCG SUBL  . famotidine (PEPCID) 20 MG tablet  . ferrous sulfate 325 (65 FE) MG tablet  . finasteride (PROSCAR) 5 MG tablet  . gabapentin (NEURONTIN) 300 MG capsule  . HYDROcodone-acetaminophen (NORCO) 7.5-325 MG tablet  . meclizine (ANTIVERT) 12.5 MG tablet  . Multiple Vitamin (MULTIVITAMIN WITH MINERALS) TABS tablet  . Multiple Vitamins-Minerals (PRESERVISION AREDS PO)  . Oxycodone HCl 10 MG TABS  . Polyethyl Glycol-Propyl Glycol (SYSTANE OP)  . pravastatin (PRAVACHOL) 20 MG tablet  . rOPINIRole (REQUIP) 2 MG tablet  . tamsulosin (FLOMAX) 0.4 MG CAPS capsule  . terazosin (HYTRIN) 2 MG capsule   No current facility-administered medications for this encounter.    Konrad Felix, PA-C [facility] Pre-Surgical Testing [phone]

## 2020-11-05 ENCOUNTER — Other Ambulatory Visit (HOSPITAL_COMMUNITY)
Admission: RE | Admit: 2020-11-05 | Discharge: 2020-11-05 | Disposition: A | Discharge status: Home / Self Care | Payer: PPO | Source: Ambulatory Visit | Attending: Orthopedic Surgery | Admitting: Orthopedic Surgery

## 2020-11-05 DIAGNOSIS — Z20822 Contact with and (suspected) exposure to covid-19: Secondary | ICD-10-CM | POA: Diagnosis not present

## 2020-11-05 DIAGNOSIS — Z01812 Encounter for preprocedural laboratory examination: Secondary | ICD-10-CM | POA: Insufficient documentation

## 2020-11-06 LAB — SARS CORONAVIRUS 2 (TAT 6-24 HRS): SARS Coronavirus 2: NEGATIVE

## 2020-11-08 NOTE — H&P (Signed)
TOTAL KNEE ADMISSION H&P  Patient is being admitted for right total knee arthroplasty.  Subjective:  Chief Complaint:right knee pain.  HPI: [place] 80 y.o. male, has a history of pain and functional disability in the right knee due to arthritis and has failed non-surgical conservative treatments for greater than 12 weeks to includecorticosteriod injections and activity modification.  Onset of symptoms was gradual, starting 2 years ago with gradually worsening course since that time. The patient noted no past surgery on the right knee(s).  Patient currently rates pain in the right knee(s) at 7 out of 10 with activity. Patient has worsening of pain with activity and weight bearing and pain that interferes with activities of daily living.  Patient has evidence of joint space narrowing by imaging studies. There is no active infection.  Patient Active Problem List   Diagnosis Date Noted  . Preop exam for internal medicine 10/21/2020  . Weight loss 08/30/2020  . Elbow pain, chronic, left 05/31/2020  . Knee pain, right 05/31/2020  . Diastolic dysfunction 81/19/1478  . Rash 10/16/2019  . Exudative age-related macular degeneration of left eye with active choroidal neovascularization ([place]) 10/15/2019  . Intermediate stage nonexudative age-related macular degeneration of left eye 10/15/2019  . Early stage nonexudative age-related macular degeneration of right eye 10/15/2019  . Pseudophakia 10/15/2019  . Posterior vitreous detachment of left eye 10/15/2019  . Cough 09/08/2019  . Hamstring injury, left, initial encounter 12/20/2018  . GERD (gastroesophageal reflux disease) 11/13/2018  . Chest pain 11/13/2018  . Cellulitis of hand, left 11/09/2018  . Coronary artery disease 10/24/2018  . Claudication ([place]) 02/22/2018  . 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  . Gallstones 11/23/2017  . Anemia 08/28/2017  . DOE (dyspnea on exertion) 07/31/2017  . Fatigue 07/31/2017  .  HTN (hypertension) 11/29/2016  . Acute bronchitis 04/12/2016  . Smoking greater than 30 pack years 02/02/2016  . Vertigo 02/09/2015  . Left hand pain 01/14/2015  . Elevated TSH 04/09/2014  . Abdominal pain, lower 04/07/2014  . Well adult exam 02/28/2013  . Alcoholism in recovery ([place]) 02/28/2013  . Ingrowing toenail of left foot 08/29/2012  . Allergic rhinitis 10/10/2011  . Dupuytren's contracture of hand 07/05/2011  . Vitamin B12 deficiency 12/23/2010  . BPH (benign prostatic hyperplasia) 12/23/2010  . SINUSITIS, ACUTE 03/24/2010  . CHEST PAIN 03/01/2010  . Low back pain 07/27/2009  . TOBACCO USE, QUIT 07/27/2009  . DIVERTICULOSIS, COLON 03/28/2009  . DIVERTICULITIS 03/26/2009  . ABDOMINAL PAIN, LOWER 03/26/2009  . RLS (restless legs syndrome) 10/28/2008  . SKIN RASH, ALLERGIC 10/01/2008  . ARM PAIN, LEFT 10/01/2008  . Edema 10/01/2008  . Neuropathy 04/22/2008  . UTI 02/26/2008  . RECTAL FISSURE 02/24/2008  . FEVER UNSPECIFIED 02/24/2008  . Vitamin D deficiency 01/21/2008  . INSOMNIA, CHRONIC 12/23/2007  . Depression 12/23/2007  . Osteoarthritis 12/23/2007  . SYNCOPE 12/23/2007  . DIZZINESS 12/23/2007  . CAROTID BRUIT, LEFT 12/23/2007  . FRACTURE, ARM, LEFT 12/23/2007   Past Medical History:  Diagnosis Date  . Alcoholism ([place])    Dry 13 years  . Basal cell carcinoma    Right Chest  . BPH (benign prostatic hyperplasia)   . Diverticulosis of colon   . Dizzy spells    none recent  . DJD (degenerative joint disease)    lower back  . Elbow fracture, left 2009   [name]  . GERD (gastroesophageal reflux disease)   . Glaucoma   . History of TIAs 1 yrs  ago  . Hyperkeratosis   . LBP (low back pain)   . Macular degeneration    left wet right dry sees [name]  . Melanoma [facility]) 2009   x3 [name]  . Osteoarthritis   . Restless leg syndrome   . Stroke [facility]) 2005   TIA  . Tinnitus   . Tobacco abuse   . Uses walker   . Wears glasses    reading  . Wears  partial dentures    upper    Past Surgical History:  Procedure Laterality Date  . CATARACT EXTRACTION Bilateral 2018  . HARDWARE REMOVAL Left 07/09/2020   Procedure: Left elbow hardware removal;  Surgeon: [name], MD;  Location: [facility];  Service: Orthopedics;  Laterality: Left;  60 mins  . mda     Dr. [name] wet injection  . MELANOMA EXCISION  2009   x3  . RECONSTRUCTION MEDIAL COLLATERAL LIGAMENT ELBOW W/ TENDON GRAFT  2009   Left, [name]  . TONSILLECTOMY  as child    No current facility-administered medications for this encounter.   Current Outpatient Medications  Medication Sig Dispense Refill Last Dose  . carbidopa-levodopa (SINEMET IR) 25-100 MG tablet For breakthrough restless leg, you can take 1 tablet at bedtime.  OK to take extra tab as needed during the day. (Patient taking differently: Take 1 tablet by mouth at bedtime.) 100 tablet 3   . Cholecalciferol 1000 UNITS tablet Take 3,000 Units by mouth daily.     . Cyanocobalamin (VITAMIN B-12) 1000 MCG SUBL DISSOLVE 2 TABLETS IN MOUTH EVERY DAY (Patient taking differently: Take 2,000 mcg by mouth daily.) 60 tablet 8   . famotidine (PEPCID) 20 MG tablet Take 20 mg by mouth as needed for heartburn or indigestion. [facility] brand pepcid     . ferrous sulfate 325 (65 FE) MG tablet Take 650 mg by mouth daily with breakfast.     . finasteride (PROSCAR) 5 MG tablet TAKE 1 TABLET BY MOUTH EVERY DAY (Patient taking differently: Take 5 mg by mouth in the morning.) 90 tablet 3   . gabapentin (NEURONTIN) 300 MG capsule TAKE 1 CAPSULE (300 MG TOTAL) BY MOUTH 4 (FOUR) TIMES DAILY. (Patient taking differently: Take 300 mg by mouth 4 (four) times daily -  before meals and at bedtime.) 360 capsule 1   . meclizine (ANTIVERT) 12.5 MG tablet TAKE 1 TABLET (12.5 MG TOTAL) BY MOUTH 3 (THREE) TIMES DAILY AS NEEDED FOR DIZZINESS. 60 tablet 1   . Multiple Vitamin (MULTIVITAMIN WITH MINERALS) TABS tablet Take 1 tablet by mouth  daily.     . Multiple Vitamins-Minerals (PRESERVISION AREDS PO) Take 2 tablets by mouth 2 (two) times daily.     . Oxycodone HCl 10 MG TABS Take 10 mg by mouth 4 (four) times daily as needed (pain).     Vladimir Faster Glycol-Propyl Glycol (SYSTANE OP) Place 1 drop into both eyes 2 (two) times daily as needed (dry eyes).     . pravastatin (PRAVACHOL) 20 MG tablet TAKE 1 TABLET BY MOUTH EVERY DAY (Patient taking differently: Take 20 mg by mouth daily.) 90 tablet 1   . rOPINIRole (REQUIP) 2 MG tablet TAKE 1 TABLET BY MOUTH AT 12PM, 1 TABLET AT 4PM AND 1 TABLETS AT BEDTIME (Patient taking differently: Take 1-2 mg by mouth See admin instructions. Take 1 mg by mouth with breakfast, lunch, and dinner and 2 mg at bedtime) 360 tablet 1   . tamsulosin (FLOMAX) 0.4  MG CAPS capsule TAKE 1 CAPSULE BY MOUTH EVERY DAY (Patient taking differently: Take 0.4 mg by mouth in the morning.) 90 capsule 3   . terazosin (HYTRIN) 2 MG capsule TAKE 1 CAPSULE BY MOUTH EVERYDAY AT BEDTIME (Patient taking differently: Take 2 mg by mouth at bedtime.) 90 capsule 3   . aspirin 81 MG EC tablet Take 1 tablet (81 mg total) by mouth daily. (Patient not taking: No sig reported) 100 tablet 3 Not Taking at Unknown time  . HYDROcodone-acetaminophen (NORCO) 7.5-325 MG tablet Take 1 tablet by mouth every 6 (six) hours as needed for severe pain. (Patient not taking: No sig reported) 20 tablet 0 Not Taking at Unknown time   Allergies  Allergen Reactions  . Levofloxacin     REACTION: hands pealed    Social History   Tobacco Use  . Smoking status: Former Smoker    Packs/day: 3.00    Years: 38.00    Pack years: 114.00    Types: Cigarettes    Quit date: 1986    Years since quitting: 36.3  . Smokeless tobacco: Never Used  . Tobacco comment: states he quit May 15 1985  Substance Use Topics  . Alcohol use: No    Alcohol/week: 0.0 standard drinks    Comment: PREVIOUS - DRY 55yrs    Family History  Problem Relation Age of Onset  .  Cancer Mother        ?breast  . Cancer Father        Lung     Review of Systems  Constitutional: Negative for chills and fever.  Respiratory: Negative for cough and shortness of breath.   Cardiovascular: Negative for chest pain.  Gastrointestinal: Negative for nausea and vomiting.  Musculoskeletal: Positive for arthralgias.    Objective:  Physical Exam Well nourished and well developed. General: Alert and oriented x3, cooperative and pleasant, no acute distress. Head: normocephalic, atraumatic, neck supple. Eyes: EOMI.  Musculoskeletal:  Right knee exam: Mild effusion without warmth erythema Flexion contracture about 5 degrees with flexion to about 120 degrees with tightness and pain Tenderness at the joint lines Crepitation and discomfort noted anteriorly  Left Elbow exam: Small pin that protrudes from the medial elbow with pressure. No surrounding erythema or warmth.  Calves soft and nontender. Motor function intact in LE. Strength 5/5 LE bilaterally. Neuro: Distal pulses 2+. Sensation to light touch intact in LE. Vital signs in last 24 hours:    Labs:   Estimated body mass index is 24.01 kg/m as calculated from the following:   Height as of 10/28/20: 6' (1.829 m).   Weight as of 10/28/20: 80.3 kg.   Imaging Review Plain radiographs demonstrate severe degenerative joint disease of the right knee(s). The overall alignment isneutral. The bone quality appears to be adequate for age and reported activity level.  Assessment/Plan:  End stage arthritis, right knee   The patient history, physical examination, clinical judgment of the provider and imaging studies are consistent with end stage degenerative joint disease of the right knee(s) and total knee arthroplasty is deemed medically necessary. The treatment options including medical management, injection therapy arthroscopy and arthroplasty were discussed at length. The risks and benefits of total knee arthroplasty  were presented and reviewed. The risks due to aseptic loosening, infection, stiffness, patella tracking problems, thromboembolic complications and other imponderables were discussed. The patient acknowledged the explanation, agreed to proceed with the plan and consent was signed. Patient is being admitted for inpatient treatment for surgery, pain control,  PT, OT, prophylactic antibiotics, VTE prophylaxis, progressive ambulation and ADL's and discharge planning. The patient is planning to be discharged home.  Therapy Plans: outpatient therapy at [facility] on 5/13 Disposition: home with wife & son Planned DVT Prophylaxis: aspirin 81mg  BID DME needed: none PCP: Dr. Alain Marion, clearance received TXA: IV Allergies: Levaquin - skin peeling Anesthesia Concerns: none BMI: 27.8 Not diabetic.  Other: - Oxycodone 10 mg Q6H per Dr. Nelva Bush, will add in oxycodone - hx of TIA several years ago - Chronic anemia - Has pin protruding from left elbow, may be removed in office or during surgery -- will call  Patient's anticipated LOS is less than 2 midnights, meeting these requirements: - Younger than 49 - Lives within 1 hour of care - Has a competent adult at home to recover with post-op recover - NO history of  - Chronic pain requiring opiods  - Diabetes  - Coronary Artery Disease  - Heart failure  - Heart attack  - Stroke  - DVT/VTE  - Cardiac arrhythmia  - Respiratory Failure/COPD  - Renal failure  - Anemia  - Advanced Liver disease    Griffith Citron, PA-C Orthopedic Surgery [facility] [place] [phone]

## 2020-11-09 ENCOUNTER — Ambulatory Visit (HOSPITAL_COMMUNITY): Payer: PPO | Admitting: Physician Assistant

## 2020-11-09 ENCOUNTER — Encounter (HOSPITAL_COMMUNITY): Payer: Self-pay | Admitting: Orthopedic Surgery

## 2020-11-09 ENCOUNTER — Other Ambulatory Visit: Payer: Self-pay

## 2020-11-09 ENCOUNTER — Encounter (HOSPITAL_COMMUNITY)
Admission: RE | Disposition: A | Discharge status: Home / Self Care | Payer: Self-pay | Source: Home / Self Care | Attending: Orthopedic Surgery

## 2020-11-09 ENCOUNTER — Ambulatory Visit (HOSPITAL_COMMUNITY): Payer: PPO | Admitting: Certified Registered Nurse Anesthetist

## 2020-11-09 ENCOUNTER — Observation Stay (HOSPITAL_COMMUNITY)
Admission: RE | Admit: 2020-11-09 | Discharge: 2020-11-10 | Disposition: A | Discharge status: Home / Self Care | Payer: PPO | Attending: Orthopedic Surgery | Admitting: Orthopedic Surgery

## 2020-11-09 DIAGNOSIS — Z87891 Personal history of nicotine dependence: Secondary | ICD-10-CM | POA: Diagnosis not present

## 2020-11-09 DIAGNOSIS — I2510 Atherosclerotic heart disease of native coronary artery without angina pectoris: Secondary | ICD-10-CM | POA: Insufficient documentation

## 2020-11-09 DIAGNOSIS — Z7982 Long term (current) use of aspirin: Secondary | ICD-10-CM | POA: Diagnosis not present

## 2020-11-09 DIAGNOSIS — I10 Essential (primary) hypertension: Secondary | ICD-10-CM | POA: Insufficient documentation

## 2020-11-09 DIAGNOSIS — Z85828 Personal history of other malignant neoplasm of skin: Secondary | ICD-10-CM | POA: Diagnosis not present

## 2020-11-09 DIAGNOSIS — M1711 Unilateral primary osteoarthritis, right knee: Principal | ICD-10-CM | POA: Insufficient documentation

## 2020-11-09 DIAGNOSIS — G8918 Other acute postprocedural pain: Secondary | ICD-10-CM | POA: Diagnosis not present

## 2020-11-09 DIAGNOSIS — Z96651 Presence of right artificial knee joint: Secondary | ICD-10-CM

## 2020-11-09 DIAGNOSIS — Z8673 Personal history of transient ischemic attack (TIA), and cerebral infarction without residual deficits: Secondary | ICD-10-CM | POA: Diagnosis not present

## 2020-11-09 DIAGNOSIS — K219 Gastro-esophageal reflux disease without esophagitis: Secondary | ICD-10-CM | POA: Diagnosis not present

## 2020-11-09 DIAGNOSIS — Z79899 Other long term (current) drug therapy: Secondary | ICD-10-CM | POA: Insufficient documentation

## 2020-11-09 HISTORY — PX: TOTAL KNEE ARTHROPLASTY: SHX125

## 2020-11-09 SURGERY — ARTHROPLASTY, KNEE, TOTAL
Anesthesia: Spinal | Site: Knee | Laterality: Right

## 2020-11-09 MED ORDER — DEXAMETHASONE SODIUM PHOSPHATE 10 MG/ML IJ SOLN
8.0000 mg | Freq: Once | INTRAMUSCULAR | Status: AC
  Administered 2020-11-09: 4 mg via INTRAVENOUS

## 2020-11-09 MED ORDER — METOCLOPRAMIDE HCL 5 MG/ML IJ SOLN: 5.0000 mg | Freq: Three times a day (TID) | INTRAMUSCULAR | Status: DC | PRN

## 2020-11-09 MED ORDER — POLYETHYLENE GLYCOL 3350 17 G PO PACK: 17.0000 g | PACK | Freq: Every day | ORAL | Status: DC | PRN

## 2020-11-09 MED ORDER — MENTHOL 3 MG MT LOZG: 1.0000 | LOZENGE | OROMUCOSAL | Status: DC | PRN

## 2020-11-09 MED ORDER — KETOROLAC TROMETHAMINE 30 MG/ML IJ SOLN
INTRAMUSCULAR | Status: DC | PRN
  Administered 2020-11-09: 30 mg

## 2020-11-09 MED ORDER — ONDANSETRON HCL 4 MG/2ML IJ SOLN
4.0000 mg | Freq: Once | INTRAMUSCULAR | Status: DC | PRN
Start: 2020-11-09 — End: 2020-11-09

## 2020-11-09 MED ORDER — FINASTERIDE 5 MG PO TABS
5.0000 mg | ORAL_TABLET | Freq: Every morning | ORAL | Status: DC
  Administered 2020-11-10: 5 mg via ORAL
  Filled 2020-11-09: qty 1

## 2020-11-09 MED ORDER — ASPIRIN 81 MG PO CHEW
81.0000 mg | CHEWABLE_TABLET | Freq: Two times a day (BID) | ORAL | Status: DC
  Administered 2020-11-09 – 2020-11-10 (×2): 81 mg via ORAL
  Filled 2020-11-09 (×2): qty 1

## 2020-11-09 MED ORDER — CEFAZOLIN SODIUM-DEXTROSE 2-4 GM/100ML-% IV SOLN
2.0000 g | INTRAVENOUS | Status: AC
  Administered 2020-11-09: 2 g via INTRAVENOUS
  Filled 2020-11-09: qty 100

## 2020-11-09 MED ORDER — ROPINIROLE HCL 1 MG PO TABS
1.0000 mg | ORAL_TABLET | Freq: Three times a day (TID) | ORAL | Status: DC
  Administered 2020-11-10 (×2): 1 mg via ORAL
  Filled 2020-11-09 (×2): qty 1

## 2020-11-09 MED ORDER — OXYCODONE HCL 5 MG PO TABS
10.0000 mg | ORAL_TABLET | ORAL | Status: DC | PRN
  Filled 2020-11-09: qty 2

## 2020-11-09 MED ORDER — TAMSULOSIN HCL 0.4 MG PO CAPS
0.4000 mg | ORAL_CAPSULE | Freq: Every morning | ORAL | Status: DC
  Administered 2020-11-10: 0.4 mg via ORAL
  Filled 2020-11-09: qty 1

## 2020-11-09 MED ORDER — ROPINIROLE HCL 1 MG PO TABS
2.0000 mg | ORAL_TABLET | Freq: Every day | ORAL | Status: DC
  Administered 2020-11-09: 2 mg via ORAL
  Filled 2020-11-09: qty 2

## 2020-11-09 MED ORDER — SODIUM CHLORIDE 0.9 % IR SOLN
Status: DC | PRN
  Administered 2020-11-09: 1000 mL

## 2020-11-09 MED ORDER — FERROUS SULFATE 325 (65 FE) MG PO TABS
650.0000 mg | ORAL_TABLET | Freq: Every day | ORAL | Status: DC
  Administered 2020-11-10: 650 mg via ORAL
  Filled 2020-11-09: qty 2

## 2020-11-09 MED ORDER — SODIUM CHLORIDE (PF) 0.9 % IJ SOLN
INTRAMUSCULAR | Status: AC
  Filled 2020-11-09: qty 30

## 2020-11-09 MED ORDER — PHENYLEPHRINE 40 MCG/ML (10ML) SYRINGE FOR IV PUSH (FOR BLOOD PRESSURE SUPPORT)
PREFILLED_SYRINGE | INTRAVENOUS | Status: DC | PRN
  Administered 2020-11-09: 80 ug via INTRAVENOUS

## 2020-11-09 MED ORDER — FENTANYL CITRATE (PF) 100 MCG/2ML IJ SOLN: 25.0000 ug | INTRAMUSCULAR | Status: DC | PRN

## 2020-11-09 MED ORDER — CEFAZOLIN SODIUM-DEXTROSE 2-4 GM/100ML-% IV SOLN
2.0000 g | Freq: Four times a day (QID) | INTRAVENOUS | Status: AC
  Administered 2020-11-09 – 2020-11-10 (×2): 2 g via INTRAVENOUS
  Filled 2020-11-09 (×2): qty 100

## 2020-11-09 MED ORDER — METHOCARBAMOL 500 MG IVPB - SIMPLE MED
500.0000 mg | Freq: Four times a day (QID) | INTRAVENOUS | Status: DC | PRN
  Filled 2020-11-09: qty 50

## 2020-11-09 MED ORDER — KETOROLAC TROMETHAMINE 30 MG/ML IJ SOLN
INTRAMUSCULAR | Status: AC
  Filled 2020-11-09: qty 1

## 2020-11-09 MED ORDER — HYDROMORPHONE HCL 1 MG/ML IJ SOLN: 0.5000 mg | INTRAMUSCULAR | Status: DC | PRN

## 2020-11-09 MED ORDER — SODIUM CHLORIDE (PF) 0.9 % IJ SOLN
INTRAMUSCULAR | Status: DC | PRN
  Administered 2020-11-09: 30 mL

## 2020-11-09 MED ORDER — PROPOFOL 1000 MG/100ML IV EMUL
INTRAVENOUS | Status: AC
  Filled 2020-11-09: qty 100

## 2020-11-09 MED ORDER — PRAVASTATIN SODIUM 20 MG PO TABS
20.0000 mg | ORAL_TABLET | Freq: Every day | ORAL | Status: DC
  Administered 2020-11-09 – 2020-11-10 (×2): 20 mg via ORAL
  Filled 2020-11-09 (×2): qty 1

## 2020-11-09 MED ORDER — DEXAMETHASONE SODIUM PHOSPHATE 10 MG/ML IJ SOLN
INTRAMUSCULAR | Status: DC | PRN
  Administered 2020-11-09: 10 mg

## 2020-11-09 MED ORDER — FAMOTIDINE 20 MG PO TABS: 20.0000 mg | ORAL_TABLET | Freq: Every day | ORAL | Status: DC | PRN

## 2020-11-09 MED ORDER — STERILE WATER FOR IRRIGATION IR SOLN
Status: DC | PRN
  Administered 2020-11-09: 2000 mL

## 2020-11-09 MED ORDER — GABAPENTIN 300 MG PO CAPS
300.0000 mg | ORAL_CAPSULE | Freq: Three times a day (TID) | ORAL | Status: DC
  Administered 2020-11-09 – 2020-11-10 (×3): 300 mg via ORAL
  Filled 2020-11-09 (×3): qty 1

## 2020-11-09 MED ORDER — DOCUSATE SODIUM 100 MG PO CAPS
100.0000 mg | ORAL_CAPSULE | Freq: Two times a day (BID) | ORAL | Status: DC
  Administered 2020-11-09 – 2020-11-10 (×2): 100 mg via ORAL
  Filled 2020-11-09 (×2): qty 1

## 2020-11-09 MED ORDER — ROPIVACAINE HCL 5 MG/ML IJ SOLN
INTRAMUSCULAR | Status: DC | PRN
  Administered 2020-11-09 (×2): 5 mL via PERINEURAL

## 2020-11-09 MED ORDER — TRANEXAMIC ACID-NACL 1000-0.7 MG/100ML-% IV SOLN
1000.0000 mg | Freq: Once | INTRAVENOUS | Status: AC
  Administered 2020-11-09: 1000 mg via INTRAVENOUS
  Filled 2020-11-09: qty 100

## 2020-11-09 MED ORDER — BUPIVACAINE IN DEXTROSE 0.75-8.25 % IT SOLN
INTRATHECAL | Status: DC | PRN
  Administered 2020-11-09: 1.6 mL via INTRATHECAL

## 2020-11-09 MED ORDER — ORAL CARE MOUTH RINSE: 15.0000 mL | Freq: Once | OROMUCOSAL | Status: AC

## 2020-11-09 MED ORDER — BUPIVACAINE-EPINEPHRINE (PF) 0.25% -1:200000 IJ SOLN
INTRAMUSCULAR | Status: DC | PRN
Start: 2020-11-09 — End: 2020-11-09
  Administered 2020-11-09: 30 mL

## 2020-11-09 MED ORDER — TERAZOSIN HCL 2 MG PO CAPS
2.0000 mg | ORAL_CAPSULE | Freq: Every day | ORAL | Status: DC
  Administered 2020-11-09: 2 mg via ORAL
  Filled 2020-11-09: qty 1

## 2020-11-09 MED ORDER — ROPINIROLE HCL 1 MG PO TABS: 1.0000 mg | ORAL_TABLET | ORAL | Status: DC

## 2020-11-09 MED ORDER — MECLIZINE HCL 25 MG PO TABS: 12.5000 mg | ORAL_TABLET | Freq: Three times a day (TID) | ORAL | Status: DC | PRN

## 2020-11-09 MED ORDER — SODIUM CHLORIDE 0.9 % IV SOLN: INTRAVENOUS | Status: DC

## 2020-11-09 MED ORDER — FENTANYL CITRATE (PF) 100 MCG/2ML IJ SOLN
50.0000 ug | INTRAMUSCULAR | Status: DC
  Administered 2020-11-09 (×2): 25 ug via INTRAVENOUS
  Filled 2020-11-09: qty 2

## 2020-11-09 MED ORDER — BISACODYL 10 MG RE SUPP: 10.0000 mg | Freq: Every day | RECTAL | Status: DC | PRN

## 2020-11-09 MED ORDER — BUPIVACAINE-EPINEPHRINE (PF) 0.25% -1:200000 IJ SOLN
INTRAMUSCULAR | Status: AC
  Filled 2020-11-09: qty 30

## 2020-11-09 MED ORDER — ACETAMINOPHEN 10 MG/ML IV SOLN: 1000.0000 mg | Freq: Once | INTRAVENOUS | Status: DC | PRN

## 2020-11-09 MED ORDER — 0.9 % SODIUM CHLORIDE (POUR BTL) OPTIME
TOPICAL | Status: DC | PRN
  Administered 2020-11-09: 1000 mL

## 2020-11-09 MED ORDER — CARBIDOPA-LEVODOPA 25-100 MG PO TABS
1.0000 | ORAL_TABLET | Freq: Every day | ORAL | Status: DC
  Administered 2020-11-09: 1 via ORAL
  Filled 2020-11-09: qty 1

## 2020-11-09 MED ORDER — ONDANSETRON HCL 4 MG PO TABS: 4.0000 mg | ORAL_TABLET | Freq: Four times a day (QID) | ORAL | Status: DC | PRN

## 2020-11-09 MED ORDER — METOCLOPRAMIDE HCL 5 MG PO TABS: 5.0000 mg | ORAL_TABLET | Freq: Three times a day (TID) | ORAL | Status: DC | PRN

## 2020-11-09 MED ORDER — EPHEDRINE SULFATE-NACL 50-0.9 MG/10ML-% IV SOSY
PREFILLED_SYRINGE | INTRAVENOUS | Status: DC | PRN
  Administered 2020-11-09 (×2): 10 mg via INTRAVENOUS

## 2020-11-09 MED ORDER — OXYCODONE HCL 5 MG PO TABS
5.0000 mg | ORAL_TABLET | ORAL | Status: DC | PRN
  Administered 2020-11-09 – 2020-11-10 (×5): 10 mg via ORAL
  Filled 2020-11-09 (×4): qty 2

## 2020-11-09 MED ORDER — CHLORHEXIDINE GLUCONATE 0.12 % MT SOLN
15.0000 mL | Freq: Once | OROMUCOSAL | Status: AC
  Administered 2020-11-09: 15 mL via OROMUCOSAL

## 2020-11-09 MED ORDER — DIPHENHYDRAMINE HCL 12.5 MG/5ML PO ELIX: 12.5000 mg | ORAL_SOLUTION | ORAL | Status: DC | PRN

## 2020-11-09 MED ORDER — POVIDONE-IODINE 10 % EX SWAB
2.0000 "application " | Freq: Once | CUTANEOUS | Status: AC
  Administered 2020-11-09: 2 via TOPICAL

## 2020-11-09 MED ORDER — PHENYLEPHRINE HCL-NACL 10-0.9 MG/250ML-% IV SOLN
INTRAVENOUS | Status: DC | PRN
  Administered 2020-11-09: 50 ug/min via INTRAVENOUS

## 2020-11-09 MED ORDER — DEXAMETHASONE SODIUM PHOSPHATE 10 MG/ML IJ SOLN
10.0000 mg | Freq: Once | INTRAMUSCULAR | Status: AC
  Administered 2020-11-10: 10 mg via INTRAVENOUS
  Filled 2020-11-09: qty 1

## 2020-11-09 MED ORDER — TRANEXAMIC ACID-NACL 1000-0.7 MG/100ML-% IV SOLN
1000.0000 mg | INTRAVENOUS | Status: AC
  Administered 2020-11-09: 1000 mg via INTRAVENOUS
  Filled 2020-11-09: qty 100

## 2020-11-09 MED ORDER — ONDANSETRON HCL 4 MG/2ML IJ SOLN
INTRAMUSCULAR | Status: DC | PRN
  Administered 2020-11-09: 4 mg via INTRAVENOUS

## 2020-11-09 MED ORDER — ROPIVACAINE HCL 7.5 MG/ML IJ SOLN
INTRAMUSCULAR | Status: DC | PRN
  Administered 2020-11-09 (×4): 5 mL via PERINEURAL

## 2020-11-09 MED ORDER — LACTATED RINGERS IV SOLN: INTRAVENOUS | Status: DC

## 2020-11-09 MED ORDER — PHENOL 1.4 % MT LIQD: 1.0000 | OROMUCOSAL | Status: DC | PRN

## 2020-11-09 MED ORDER — METHOCARBAMOL 500 MG PO TABS
500.0000 mg | ORAL_TABLET | Freq: Four times a day (QID) | ORAL | Status: DC | PRN
  Administered 2020-11-09: 500 mg via ORAL
  Filled 2020-11-09: qty 1

## 2020-11-09 MED ORDER — ONDANSETRON HCL 4 MG/2ML IJ SOLN: 4.0000 mg | Freq: Four times a day (QID) | INTRAMUSCULAR | Status: DC | PRN

## 2020-11-09 MED ORDER — CLONIDINE HCL (ANALGESIA) 100 MCG/ML EP SOLN
EPIDURAL | Status: DC | PRN
  Administered 2020-11-09: 100 ug

## 2020-11-09 MED ORDER — ACETAMINOPHEN 325 MG PO TABS: 325.0000 mg | ORAL_TABLET | Freq: Four times a day (QID) | ORAL | Status: DC | PRN

## 2020-11-09 MED ORDER — MEPERIDINE HCL 50 MG/ML IJ SOLN: 6.2500 mg | INTRAMUSCULAR | Status: DC | PRN

## 2020-11-09 MED ORDER — PROPOFOL 500 MG/50ML IV EMUL
INTRAVENOUS | Status: DC | PRN
  Administered 2020-11-09: 75 ug/kg/min via INTRAVENOUS

## 2020-11-09 MED ORDER — ALBUMIN HUMAN 5 % IV SOLN: INTRAVENOUS | Status: DC | PRN

## 2020-11-09 MED ORDER — PROPOFOL 10 MG/ML IV BOLUS
INTRAVENOUS | Status: DC | PRN
  Administered 2020-11-09: 20 mg via INTRAVENOUS

## 2020-11-09 SURGICAL SUPPLY — 55 items
ADH SKN CLS APL DERMABOND .7 (GAUZE/BANDAGES/DRESSINGS) ×1
ATTUNE MED ANAT PAT 38 KNEE (Knees) ×1 IMPLANT
ATTUNE PS FEM RT SZ 6 CEM KNEE (Femur) ×1 IMPLANT
ATTUNE PSRP INSR SZ6 7 KNEE (Insert) ×1 IMPLANT
BAG SPEC THK2 15X12 ZIP CLS (MISCELLANEOUS)
BAG ZIPLOCK 12X15 (MISCELLANEOUS) IMPLANT
BASE TIBIA ATTUNE KNEE SYS SZ6 (Knees) IMPLANT
BLADE SAW SGTL 11.0X1.19X90.0M (BLADE) IMPLANT
BLADE SAW SGTL 13.0X1.19X90.0M (BLADE) ×2 IMPLANT
BLADE SURG SZ10 CARB STEEL (BLADE) ×4 IMPLANT
BNDG ELASTIC 6X5.8 VLCR STR LF (GAUZE/BANDAGES/DRESSINGS) ×2 IMPLANT
BOWL SMART MIX CTS (DISPOSABLE) ×2 IMPLANT
BSPLAT TIB 6 CMNT ROT PLAT STR (Knees) ×1 IMPLANT
CEMENT HV SMART SET (Cement) ×2 IMPLANT
COVER WAND RF STERILE (DRAPES) IMPLANT
CUFF TOURN SGL QUICK 34 (TOURNIQUET CUFF) ×2
CUFF TRNQT CYL 34X4.125X (TOURNIQUET CUFF) ×1 IMPLANT
DECANTER SPIKE VIAL GLASS SM (MISCELLANEOUS) ×4 IMPLANT
DERMABOND ADVANCED (GAUZE/BANDAGES/DRESSINGS) ×1
DERMABOND ADVANCED .7 DNX12 (GAUZE/BANDAGES/DRESSINGS) ×1 IMPLANT
DRAPE U-SHAPE 47X51 STRL (DRAPES) ×2 IMPLANT
DRESSING AQUACEL AG SP 3.5X10 (GAUZE/BANDAGES/DRESSINGS) ×1 IMPLANT
DRSG AQUACEL AG SP 3.5X10 (GAUZE/BANDAGES/DRESSINGS) ×2
DURAPREP 26ML APPLICATOR (WOUND CARE) ×4 IMPLANT
ELECT REM PT RETURN 15FT ADLT (MISCELLANEOUS) ×2 IMPLANT
GLOVE SURG ENC MOIS LTX SZ6 (GLOVE) IMPLANT
GLOVE SURG ORTHO LTX SZ7.5 (GLOVE) ×2 IMPLANT
GLOVE SURG UNDER LTX SZ7.5 (GLOVE) ×2 IMPLANT
GLOVE SURG UNDER POLY LF SZ6.5 (GLOVE) IMPLANT
GOWN STRL REUS W/TWL LRG LVL3 (GOWN DISPOSABLE) ×2 IMPLANT
HANDPIECE INTERPULSE COAX TIP (DISPOSABLE) ×2
HOLDER FOLEY CATH W/STRAP (MISCELLANEOUS) IMPLANT
KIT TURNOVER KIT A (KITS) ×2 IMPLANT
MANIFOLD NEPTUNE II (INSTRUMENTS) ×2 IMPLANT
NDL SAFETY ECLIPSE 18X1.5 (NEEDLE) IMPLANT
NEEDLE HYPO 18GX1.5 SHARP (NEEDLE)
NS IRRIG 1000ML POUR BTL (IV SOLUTION) ×2 IMPLANT
PACK TOTAL KNEE CUSTOM (KITS) ×2 IMPLANT
PENCIL SMOKE EVACUATOR (MISCELLANEOUS) IMPLANT
PIN DRILL FIX HALF THREAD (BIT) ×1 IMPLANT
PIN FIX SIGMA LCS THRD HI (PIN) ×1 IMPLANT
PROTECTOR NERVE ULNAR (MISCELLANEOUS) ×2 IMPLANT
SET HNDPC FAN SPRY TIP SCT (DISPOSABLE) ×1 IMPLANT
SET PAD KNEE POSITIONER (MISCELLANEOUS) ×2 IMPLANT
SUT MNCRL AB 4-0 PS2 18 (SUTURE) ×2 IMPLANT
SUT STRATAFIX PDS+ 0 24IN (SUTURE) ×2 IMPLANT
SUT VIC AB 1 CT1 36 (SUTURE) ×2 IMPLANT
SUT VIC AB 2-0 CT1 27 (SUTURE) ×6
SUT VIC AB 2-0 CT1 TAPERPNT 27 (SUTURE) ×3 IMPLANT
SYR 3ML LL SCALE MARK (SYRINGE) ×2 IMPLANT
TIBIA ATTUNE KNEE SYS BASE SZ6 (Knees) ×2 IMPLANT
TRAY FOLEY MTR SLVR 16FR STAT (SET/KITS/TRAYS/PACK) ×2 IMPLANT
TUBE SUCTION HIGH CAP CLEAR NV (SUCTIONS) ×2 IMPLANT
WATER STERILE IRR 1000ML POUR (IV SOLUTION) ×4 IMPLANT
WRAP KNEE MAXI GEL POST OP (GAUZE/BANDAGES/DRESSINGS) ×2 IMPLANT

## 2020-11-09 NOTE — Progress Notes (Signed)
Assisted Dr. Lyn Hollingshead with Right Adductor Canal  block. Side rails up, monitors on throughout procedure. See vital signs in flow sheet. Tolerated Procedure well.

## 2020-11-09 NOTE — Anesthesia Procedure Notes (Signed)
Anesthesia Regional Block: Adductor canal block   Pre-Anesthetic Checklist: ,, timeout performed, Correct Patient, Correct Site, Correct Laterality, Correct Procedure, Correct Position, site marked, Risks and benefits discussed,  Surgical consent,  Pre-op evaluation,  At surgeon's request and post-op pain management  Laterality: Lower and Right  Prep: chloraprep       Needles:  Injection technique: Single-shot  Needle Type: Echogenic Stimulator Needle     Needle Length: 9cm  Needle Gauge: 20   Needle insertion depth: 2 cm   Additional Needles:   Procedures:,,,, ultrasound used (permanent image in chart),,,,  Narrative:  Start time: 11/09/2020 12:35 PM End time: 11/09/2020 12:45 PM Injection made incrementally with aspirations every 5 mL.  Performed by: Personally  Anesthesiologist: Lyn Hollingshead, MD

## 2020-11-09 NOTE — Care Plan (Signed)
Ortho Bundle Case Management Note  Patient Details  Name: Justin [name]. MRN: [id number] Date of Birth: 1941-05-14                  R TKA on 11/09/20. DCP: Home with wife & son. Sister, daughter, other family also coming to check in on him. DME: No needs. Has RW. Has raised toilets, doesn't want 3in1. PT: EO 5/13   DME Arranged:  N/A DME Agency:     HH Arranged:    HH Agency:     Additional Comments: Please contact me with any questions of if this plan should need to change.  Marianne Sofia, RN,CCM [facility]  [phone] 11/09/2020, 1:21 PM

## 2020-11-09 NOTE — Op Note (Signed)
NAME:  Justin [name].                      MEDICAL RECORD NO.:  [id number]                             FACILITY:  [facility]      PHYSICIAN:  Pietro Cassis. Alvan Dame, M.D.  DATE OF BIRTH:  14-Apr-1941      DATE OF PROCEDURE:  11/09/2020                                     OPERATIVE REPORT         PREOPERATIVE DIAGNOSIS:  Right knee osteoarthritis.      POSTOPERATIVE DIAGNOSIS:  Right knee osteoarthritis.      FINDINGS:  The patient was noted to have complete loss of cartilage and   bone-on-bone arthritis with associated osteophytes in the medial and patellofemoral compartments of   the knee with a significant synovitis and associated effusion.  The patient had failed months of conservative treatment including medications, injection therapy, activity modification.     PROCEDURE:  Right total knee replacement.      COMPONENTS USED:  DePuy Attune rotating platform posterior stabilized knee   system, a size 6 femur, 6 tibia, size 7 mm PS AOX insert, and 38 anatomic patellar   button.      SURGEON:  Pietro Cassis. Alvan Dame, M.D.      ASSISTANT:  Griffith Citron, PA-C.      ANESTHESIA:  Regional and Spinal.      SPECIMENS:  None.      COMPLICATION:  None.      DRAINS:  None.  EBL: <150 cc      TOURNIQUET TIME:  31 min at 250 mmHg     The patient was stable to the recovery room.      INDICATION FOR PROCEDURE:  Justin [name]. is a 80 y.o. male patient of   mine.  The patient had been seen, evaluated, and treated for months conservatively in the   office with medication, activity modification, and injections.  The patient had   radiographic changes of bone-on-bone arthritis with endplate sclerosis and osteophytes noted.  Based on the radiographic changes and failed conservative measures, the patient   decided to proceed with definitive treatment, total knee replacement.  Risks of infection, DVT, component failure, need for revision surgery, neurovascular injury were reviewed in the  office setting.  The postop course was reviewed stressing the efforts to maximize post-operative satisfaction and function.  Consent was obtained for benefit of pain   relief.      PROCEDURE IN DETAIL:  The patient was brought to the operative theater.   Once adequate anesthesia, preoperative antibiotics, 2 gm of Ancef,1 gm of Tranexamic Acid, and 10 mg of Decadron administered, the patient was positioned supine with a right thigh tourniquet placed.  The  right lower extremity was prepped and draped in sterile fashion.  A time-   out was performed identifying the patient, planned procedure, and the appropriate extremity.      The right lower extremity was placed in the [facility] leg holder.  The leg was   exsanguinated, tourniquet elevated to 250 mmHg.  A midline incision was   made followed by median parapatellar arthrotomy.  Following initial   exposure,  attention was first directed to the patella.  Precut   measurement was noted to be 25 mm.  I resected down to 14 mm and used a   38 anatomic patellar button to restore patellar height as well as cover the cut surface.      The lug holes were drilled and a metal shim was placed to protect the   patella from retractors and saw blade during the procedure.      At this point, attention was now directed to the femur.  The femoral   canal was opened with a drill, irrigated to try to prevent fat emboli.  An   intramedullary rod was passed at 5 degrees valgus, 9 mm of bone was   resected off the distal femur.  Following this resection, the tibia was   subluxated anteriorly.  Using the extramedullary guide, 2 mm of bone was resected off   the proximal medial tibia.  We confirmed the gap would be   stable medially and laterally with a size 5 spacer block as well as confirmed that the tibial cut was perpendicular in the coronal plane, checking with an alignment rod.      Once this was done, I sized the femur to be a size 6 in the anterior-   posterior  dimension, chose a standard component based on medial and   lateral dimension.  The size 6 rotation block was then pinned in   position anterior referenced using the C-clamp to set rotation.  The   anterior, posterior, and  chamfer cuts were made without difficulty nor   notching making certain that I was along the anterior cortex to help   with flexion gap stability.      The final box cut was made off the lateral aspect of distal femur.      At this point, the tibia was sized to be a size 6.  The size 6 tray was   then pinned in position through the medial third of the tubercle,   drilled, and keel punched.  Trial reduction was now carried with a 6 femur,  6 tibia, a size 7 mm PS insert, and the 38 anatomic patella botton.  The knee was brought to full extension with good flexion stability with the patella   tracking through the trochlea without application of pressure.  Given   all these findings the trial components removed.  Final components were   opened and cement was mixed.  The knee was irrigated with normal saline solution and pulse lavage.  The synovial lining was   then injected with 30 cc of 0.25% Marcaine with epinephrine, 1 cc of Toradol and 30 cc of NS for a total of 61 cc.     Final implants were then cemented onto cleaned and dried cut surfaces of bone with the knee brought to extension with a size 7 mm PS trial insert.      Once the cement had fully cured, excess cement was removed   throughout the knee.  I confirmed that I was satisfied with the range of   motion and stability, and the final size 7 mm PS AOX insert was chosen.  It was   placed into the knee.      The tourniquet had been let down at 31 minutes.  No significant   hemostasis was required.  The extensor mechanism was then reapproximated using #1 Vicryl and #1 Stratafix sutures with the knee   in flexion.  The   remaining wound was closed with 2-0 Vicryl and running 4-0 Monocryl.   The knee was cleaned,  dried, dressed sterilely using Dermabond and   Aquacel dressing.  The patient was then   brought to recovery room in stable condition, tolerating the procedure   well.   Please note that Physician Assistant, Griffith Citron, PA-C was present for the entirety of the case, and was utilized for pre-operative positioning, peri-operative retractor management, general facilitation of the procedure and for primary wound closure at the end of the case.              Pietro Cassis Alvan Dame, M.D.    11/09/2020 1:01 PM

## 2020-11-09 NOTE — Anesthesia Procedure Notes (Addendum)
Procedure Name: MAC Date/Time: 11/09/2020 12:55 PM Performed by: West Pugh, CRNA Pre-anesthesia Checklist: Patient identified, Emergency Drugs available, Suction available, Patient being monitored and Timeout performed Patient Re-evaluated:Patient Re-evaluated prior to induction Oxygen Delivery Method: Simple face mask Preoxygenation: Pre-oxygenation with 100% oxygen Placement Confirmation: positive ETCO2

## 2020-11-09 NOTE — Anesthesia Postprocedure Evaluation (Signed)
Anesthesia Post Note  Patient: Justin [name] [facility].  Procedure(s) Performed: TOTAL KNEE ARTHROPLASTY (Right Knee)     Patient location during evaluation: PACU Anesthesia Type: Spinal Level of consciousness: awake Pain management: pain level controlled Vital Signs Assessment: post-procedure vital signs reviewed and stable Respiratory status: spontaneous breathing Cardiovascular status: stable Postop Assessment: no headache, no backache, spinal receding, patient able to bend at knees and no apparent nausea or vomiting Anesthetic complications: no   No complications documented.  Last Vitals:  Vitals:   11/09/20 1545 11/09/20 1600  BP: 132/69 132/74  Pulse: 74 (!) 58  Resp: 14 15  Temp:  36.6 C  SpO2: 97% 98%    Last Pain:  Vitals:   11/09/20 1600  TempSrc:   PainSc: 0-No pain                 John F Salome Arnt

## 2020-11-09 NOTE — Transfer of Care (Signed)
Immediate Anesthesia Transfer of Care Note  Patient: Justin [name] [facility].  Procedure(s) Performed: TOTAL KNEE ARTHROPLASTY (Right Knee)  Patient Location: PACU  Anesthesia Type:Spinal  Level of Consciousness: drowsy  Airway & Oxygen Therapy: Patient Spontanous Breathing and Patient connected to face mask oxygen  Post-op Assessment: Report given to RN and Post -op Vital signs reviewed and stable  Post vital signs: Reviewed and stable  Last Vitals:  Vitals Value Taken Time  BP 125/65 11/09/20 1448  Temp    Pulse 88 11/09/20 1451  Resp 15 11/09/20 1451  SpO2 99 % 11/09/20 1451  Vitals shown include unvalidated device data.  Last Pain:  Vitals:   11/09/20 1041  TempSrc: Oral         Complications: No complications documented.

## 2020-11-09 NOTE — Anesthesia Procedure Notes (Signed)
Spinal  Patient location during procedure: OR Start time: 11/09/2020 12:55 PM End time: 11/09/2020 1:00 PM Reason for block: surgical anesthesia Staffing Performed: resident/CRNA  Anesthesiologist: Lyn Hollingshead, MD Resident/CRNA: West Pugh, CRNA Preanesthetic Checklist Completed: patient identified, IV checked, site marked, risks and benefits discussed, surgical consent, monitors and equipment checked, pre-op evaluation and timeout performed Spinal Block Patient position: sitting Prep: DuraPrep and site prepped and draped Patient monitoring: heart rate, cardiac monitor, continuous pulse ox and blood pressure Approach: midline Location: L3-4 Injection technique: single-shot Needle Needle type: Pencan and Introducer  Needle gauge: 24 G Needle length: 10 cm Assessment Sensory level: T4 Events: CSF return Additional Notes IV functioning, monitors applied to pt. Expiration date of kit checked and confirmed to be in date. Sterile prep and drape, hand hygiene and sterile gloved used. Pt was positioned and spine was prepped in sterile fashion. Skin was anesthetized with lidocaine. Free flow of clear CSF obtained prior to injecting local anesthetic into CSF x 1 attempt. Spinal needle aspirated freely following injection. Needle was carefully withdrawn, and pt tolerated procedure well. Loss of motor and sensory on exam post injection. Dr Jillyn Hidden present throughout procedure.

## 2020-11-09 NOTE — Anesthesia Preprocedure Evaluation (Signed)
Anesthesia Evaluation  Patient identified by MRN, date of birth, ID band Patient awake    Reviewed: Allergy & Precautions, NPO status , Patient's Chart, lab work & pertinent test results  Airway Mallampati: I       Dental  (+) Partial Upper   Pulmonary former smoker,    Pulmonary exam normal        Cardiovascular Normal cardiovascular exam     Neuro/Psych    GI/Hepatic GERD  Medicated,  Endo/Other    Renal/GU      Musculoskeletal  (+) Arthritis , Osteoarthritis,    Abdominal Normal abdominal exam  (+)   Peds  Hematology  (+) Blood dyscrasia, anemia ,   Anesthesia Other Findings  Echo 10/22/2019 1. Left ventricular ejection fraction, by estimation, is 60 to 65%. The  left ventricle has normal function. The left ventricle has no regional  wall motion abnormalities. There is mild concentric left ventricular  hypertrophy. Left ventricular diastolic  parameters are consistent with Grade II diastolic dysfunction  (pseudonormalization).  2. Right ventricular systolic function is normal. The right ventricular  size is normal. There is mildly elevated pulmonary artery systolic  pressure.  3. Left atrial size was moderately dilated.  4. Right atrial size was mildly dilated.  5. The mitral valve is normal in structure. Trivial mitral valve  regurgitation. No evidence of mitral stenosis.  6. The aortic valve is tricuspid. Aortic valve regurgitation is not  visualized. No aortic stenosis is present.  7. The inferior vena cava is normal in size with greater than 50%  respiratory variability, suggesting right atrial pressure of 3 mmHg.   Stress Test 08/08/2017  Nuclear stress EF: 60%. There were no significant wall motion abnormalities.  There was no ST segment deviation noted during stress.  Defect 1: There is a medium defect of mild severity present in the basal inferior, mid inferolateral, apical inferior and  apical lateral location. This defect is consistent with diaphragmatic attenuation artifact.  This is a low risk study. No significant ischemia identified.    Reproductive/Obstetrics                             Anesthesia Physical Anesthesia Plan  ASA: II  Anesthesia Plan: Spinal   Post-op Pain Management:  Regional for Post-op pain   Induction:   PONV Risk Score and Plan: 1 and Ondansetron and Treatment may vary due to age or medical condition  Airway Management Planned: Natural Airway and Simple Face Mask  Additional Equipment: None  Intra-op Plan:   Post-operative Plan:   Informed Consent: I have reviewed the patients History and Physical, chart, labs and discussed the procedure including the risks, benefits and alternatives for the proposed anesthesia with the patient or authorized representative who has indicated his/her understanding and acceptance.     Dental advisory given  Plan Discussed with: CRNA  Anesthesia Plan Comments:         Anesthesia Quick Evaluation

## 2020-11-09 NOTE — Interval H&P Note (Signed)
History and Physical Interval Note:  11/09/2020 11:33 AM  [place].  has presented today for surgery, with the diagnosis of Right knee osteoarthritis.  The various methods of treatment have been discussed with the patient and family. After consideration of risks, benefits and other options for treatment, the patient has consented to  Procedure(s): TOTAL KNEE ARTHROPLASTY (Right) as a surgical intervention.  The patient's history has been reviewed, patient examined, no change in status, stable for surgery.  I have reviewed the patient's chart and labs.  Questions were answered to the patient's satisfaction.     Mauri Pole

## 2020-11-10 DIAGNOSIS — M1711 Unilateral primary osteoarthritis, right knee: Secondary | ICD-10-CM | POA: Diagnosis not present

## 2020-11-10 LAB — BASIC METABOLIC PANEL
Anion gap: 6 (ref 5–15)
BUN: 21 mg/dL (ref 8–23)
CO2: 26 mmol/L (ref 22–32)
Calcium: 8.8 mg/dL — ABNORMAL LOW (ref 8.9–10.3)
Chloride: 105 mmol/L (ref 98–111)
Creatinine, Ser: 1.06 mg/dL (ref 0.61–1.24)
GFR, Estimated: >60 mL/min (ref >60)
Glucose, Bld: 183 mg/dL — ABNORMAL HIGH (ref 70–99)
Potassium: 4.2 mmol/L (ref 3.5–5.1)
Sodium: 137 mmol/L (ref 135–145)

## 2020-11-10 LAB — CBC
HCT: 24.7 % — ABNORMAL LOW (ref 39.0–52.0)
Hemoglobin: 7.9 g/dL — ABNORMAL LOW (ref 13.0–17.0)
MCH: 30.5 pg (ref 26.0–34.0)
MCHC: 32 g/dL (ref 30.0–36.0)
MCV: 95.4 fL (ref 80.0–100.0)
Platelets: 112 10*3/uL — ABNORMAL LOW (ref 150–400)
RBC: 2.59 MIL/uL — ABNORMAL LOW (ref 4.22–5.81)
RDW: 13.7 % (ref 11.5–15.5)
WBC: 4.8 10*3/uL (ref 4.0–10.5)
nRBC: 0 % (ref 0.0–0.2)

## 2020-11-10 MED ORDER — METHOCARBAMOL 500 MG PO TABS: 500.0000 mg | ORAL_TABLET | Freq: Four times a day (QID) | ORAL | 0 refills | Status: DC | PRN

## 2020-11-10 MED ORDER — DOCUSATE SODIUM 100 MG PO CAPS: 100.0000 mg | ORAL_CAPSULE | Freq: Two times a day (BID) | ORAL | 0 refills | Status: DC

## 2020-11-10 MED ORDER — ASPIRIN 81 MG PO CHEW: 81.0000 mg | CHEWABLE_TABLET | Freq: Two times a day (BID) | ORAL | 0 refills | Status: AC

## 2020-11-10 MED ORDER — POLYETHYLENE GLYCOL 3350 17 G PO PACK: 17.0000 g | PACK | Freq: Every day | ORAL | 0 refills | Status: DC | PRN

## 2020-11-10 NOTE — Discharge Instructions (Signed)
INSTRUCTIONS AFTER JOINT REPLACEMENT   ** Take Oxycodone 10 mg 1 tablet every 4 hours as needed for pain. Call the office [phone] when refill is needed or if this is not sufficient for pain.   o Remove items at home which could result in a fall. This includes throw rugs or furniture in walking pathways o ICE to the affected joint every three hours while awake for 30 minutes at a time, for at least the first 3-5 days, and then as needed for pain and swelling.  Continue to use ice for pain and swelling. You may notice swelling that will progress down to the foot and ankle.  This is normal after surgery.  Elevate your leg when you are not up walking on it.   o Continue to use the breathing machine you got in the hospital (incentive spirometer) which will help keep your temperature down.  It is common for your temperature to cycle up and down following surgery, especially at night when you are not up moving around and exerting yourself.  The breathing machine keeps your lungs expanded and your temperature down.   DIET:  As you were doing prior to hospitalization, we recommend a well-balanced diet.  DRESSING / WOUND CARE / SHOWERING  Keep the surgical dressing until follow up.  The dressing is water proof, so you can shower without any extra covering.  IF THE DRESSING FALLS OFF or the wound gets wet inside, change the dressing with sterile gauze.  Please use good hand washing techniques before changing the dressing.  Do not use any lotions or creams on the incision until instructed by your surgeon.    ACTIVITY  o Increase activity slowly as tolerated, but follow the weight bearing instructions below.   o No driving for 6 weeks or until further direction given by your physician.  You cannot drive while taking narcotics.  o No lifting or carrying greater than 10 lbs. until further directed by your surgeon. o Avoid periods of inactivity such as sitting longer than an hour when not asleep. This  helps prevent blood clots.  o You may return to work once you are authorized by your doctor.     WEIGHT BEARING   Weight bearing as tolerated with assist device (walker, cane, etc) as directed, use it as long as suggested by your surgeon or therapist, typically at least 4-6 weeks.   EXERCISES  Results after joint replacement surgery are often greatly improved when you follow the exercise, range of motion and muscle strengthening exercises prescribed by your doctor. Safety measures are also important to protect the joint from further injury. Any time any of these exercises cause you to have increased pain or swelling, decrease what you are doing until you are comfortable again and then slowly increase them. If you have problems or questions, call your caregiver or physical therapist for advice.   Rehabilitation is important following a joint replacement. After just a few days of immobilization, the muscles of the leg can become weakened and shrink (atrophy).  These exercises are designed to build up the tone and strength of the thigh and leg muscles and to improve motion. Often times heat used for twenty to thirty minutes before working out will loosen up your tissues and help with improving the range of motion but do not use heat for the first two weeks following surgery (sometimes heat can increase post-operative swelling).   These exercises can be done on a training (exercise)  mat, on the floor, on a table or on a bed. Use whatever works the best and is most comfortable for you.    Use music or television while you are exercising so that the exercises are a pleasant break in your day. This will make your life better with the exercises acting as a break in your routine that you can look forward to.   Perform all exercises about fifteen times, three times per day or as directed.  You should exercise both the operative leg and the other leg as well.  Exercises include:   . Quad Sets - Tighten up  the muscle on the front of the thigh (Quad) and hold for 5-10 seconds.   . Straight Leg Raises - With your knee straight (if you were given a brace, keep it on), lift the leg to 60 degrees, hold for 3 seconds, and slowly lower the leg.  Perform this exercise against resistance later as your leg gets stronger.  . Leg Slides: Lying on your back, slowly slide your foot toward your buttocks, bending your knee up off the floor (only go as far as is comfortable). Then slowly slide your foot back down until your leg is flat on the floor again.  Glenard Haring Wings: Lying on your back spread your legs to the side as far apart as you can without causing discomfort.  . Hamstring Strength:  Lying on your back, push your heel against the floor with your leg straight by tightening up the muscles of your buttocks.  Repeat, but this time bend your knee to a comfortable angle, and push your heel against the floor.  You may put a pillow under the heel to make it more comfortable if necessary.   A rehabilitation program following joint replacement surgery can speed recovery and prevent re-injury in the future due to weakened muscles. Contact your doctor or a physical therapist for more information on knee rehabilitation.    CONSTIPATION  Constipation is defined medically as fewer than three stools per week and severe constipation as less than one stool per week.  Even if you have a regular bowel pattern at home, your normal regimen is likely to be disrupted due to multiple reasons following surgery.  Combination of anesthesia, postoperative narcotics, change in appetite and fluid intake all can affect your bowels.   YOU MUST use at least one of the following options; they are listed in order of increasing strength to get the job done.  They are all available over the counter, and you may need to use some, POSSIBLY even all of these options:    Drink plenty of fluids (prune juice may be helpful) and high fiber foods Colace  100 mg by mouth twice a day  Senokot for constipation as directed and as needed Dulcolax (bisacodyl), take with full glass of water  Miralax (polyethylene glycol) once or twice a day as needed.  If you have tried all these things and are unable to have a bowel movement in the first 3-4 days after surgery call either your surgeon or your primary doctor.    If you experience loose stools or diarrhea, hold the medications until you stool forms back up.  If your symptoms do not get better within 1 week or if they get worse, check with your doctor.  If you experience "the worst abdominal pain ever" or develop nausea or vomiting, please contact the office immediately for further recommendations for treatment.   ITCHING:  If you  experience itching with your medications, try taking only a single pain pill, or even half a pain pill at a time.  You can also use Benadryl over the counter for itching or also to help with sleep.   TED HOSE STOCKINGS:  Use stockings on both legs until for at least 2 weeks or as directed by physician office. They may be removed at night for sleeping.  MEDICATIONS:  See your medication summary on the "After Visit Summary" that nursing will review with you.  You may have some home medications which will be placed on hold until you complete the course of blood thinner medication.  It is important for you to complete the blood thinner medication as prescribed.  PRECAUTIONS:  If you experience chest pain or shortness of breath - call 911 immediately for transfer to the hospital emergency department.   If you develop a fever greater that 101 F, purulent drainage from wound, increased redness or drainage from wound, foul odor from the wound/dressing, or calf pain - CONTACT YOUR SURGEON.                                                   FOLLOW-UP APPOINTMENTS:  If you do not already have a post-op appointment, please call the office for an appointment to be seen by your surgeon.   Guidelines for how soon to be seen are listed in your "After Visit Summary", but are typically between 1-4 weeks after surgery.  OTHER INSTRUCTIONS:   Knee Replacement:  Do not place pillow under knee, focus on keeping the knee straight while resting. CPM instructions: 0-90 degrees, 2 hours in the morning, 2 hours in the afternoon, and 2 hours in the evening. Place foam block, curve side up under heel at all times except when in CPM or when walking.  DO NOT modify, tear, cut, or change the foam block in any way.  POST-OPERATIVE OPIOID TAPER INSTRUCTIONS: . It is important to wean off of your opioid medication as soon as possible. If you do not need pain medication after your surgery it is ok to stop day one. [name] Opioids include: o Codeine, Hydrocodone(Norco, Vicodin), Oxycodone(Percocet, oxycontin) and hydromorphone amongst others.  . Long term and even short term use of opiods can cause: o Increased pain response o Dependence o Constipation o Depression o Respiratory depression o And more.  . Withdrawal symptoms can include o Flu like symptoms o Nausea, vomiting o And more . Techniques to manage these symptoms o Hydrate well o Eat regular healthy meals o Stay active o Use relaxation techniques(deep breathing, meditating, yoga) . Do Not substitute Alcohol to help with tapering . If you have been on opioids for less than two weeks and do not have pain than it is ok to stop all together.  . Plan to wean off of opioids o This plan should start within one week post op of your joint replacement. o Maintain the same interval or time between taking each dose and first decrease the dose.  o Cut the total daily intake of opioids by one tablet each day o Next start to increase the time between doses. o The last dose that should be eliminated is the evening dose.     MAKE SURE YOU:  . Understand these instructions.  . Get help right away if you are not  doing well or get worse.    Thank  you for letting us be a part of your medical care team.  It is a privilege we respect greatly.  We hope these instructions will help you stay on track for a fast and full recovery!

## 2020-11-10 NOTE — TOC Transition Note (Signed)
Transition of Care [facility]) - CM/SW Discharge Note   Patient Details  Name: Rasool Rommel [facility]. MRN: [id number] Date of Birth: 1941-03-15  Transition of Care [facility]) CM/SW Contact:  Lennart Pall, LCSW Phone Number: 11/10/2020, 9:36 AM   Clinical Narrative:    Met with pt and confirming no DME needs.  Plan for OPPT at [facility].  No TOC needs.   Final next level of care: OP Rehab Barriers to Discharge: No Barriers Identified   Patient Goals and CMS Choice Patient states their goals for this hospitalization and ongoing recovery are:: return home      Discharge Placement                       Discharge Plan and Services                DME Arranged: N/A                    Social Determinants of Health (SDOH) Interventions     Readmission Risk Interventions No flowsheet data found.

## 2020-11-10 NOTE — Evaluation (Signed)
Physical Therapy Evaluation Patient Details Name: Justin [name] [facility]. MRN: [id number] DOB: 07-18-1940 Today's Date: 11/10/2020   History of Present Illness  Pt is an 80 year old male s/p Right TKA  Clinical Impression  Pt is s/p TKA resulting in the deficits listed below (see PT Problem List).  Pt will benefit from skilled PT to increase their independence and safety with mobility to allow discharge to the venue listed below.  Pt assisted with ambulating and performing LE exercises.  Pt ambulates with increased knee flexion bilaterally and reports left knee flexion is compensating for right leg pain presurgery.  Pt plans to d/c home and has family able to assist upon d/c.     Follow Up Recommendations Follow surgeon's recommendation for DC plan and follow-up therapies;Supervision/Assistance - 24 hour    Equipment Recommendations  None recommended by PT    Recommendations for Other Services       Precautions / Restrictions Precautions Precautions: Fall;Knee Restrictions Weight Bearing Restrictions: No      Mobility  Bed Mobility Overal bed mobility: Needs Assistance Bed Mobility: Supine to Sit     Supine to sit: Supervision;HOB elevated     General bed mobility comments: verbal cues for sequence    Transfers Overall transfer level: Needs assistance Equipment used: Rolling walker (2 wheeled) Transfers: Sit to/from Stand Sit to Stand: Min assist         General transfer comment: verbal cues for UE and LE positioning, assist to stand and steady, cues for full extension/upright posture  Ambulation/Gait Ambulation/Gait assistance: Min assist Gait Distance (Feet): 160 Feet Assistive device: Rolling walker (2 wheeled) Gait Pattern/deviations: Step-to pattern;Antalgic;Decreased stance time - right     General Gait Details: increased bil knee flexion so provided cues for extension especially with better left leg, occasional steadying assist  Stairs             Wheelchair Mobility    Modified Rankin (Stroke Patients Only)       Balance                                             Pertinent Vitals/Pain Pain Assessment: 0-10 Pain Score: 5  Pain Location: posterior right knee Pain Descriptors / Indicators: Sore;Aching Pain Intervention(s): Monitored during session;Repositioned    Home Living Family/patient expects to be discharged to:: Private residence Living Arrangements: Spouse/significant other Available Help at Discharge: Family;Available 24 hours/day Type of Home: House Home Access: Level entry     Home Layout: One level Home Equipment: Walker - 2 wheels;Bedside commode      Prior Function Level of Independence: Independent with assistive device(s)               Hand Dominance        Extremity/Trunk Assessment   Upper Extremity Assessment Upper Extremity Assessment: LUE deficits/detail LUE Deficits / Details: left hand deformity/contractures    Lower Extremity Assessment Lower Extremity Assessment: RLE deficits/detail RLE Deficits / Details: unable to perform SLR without lag, AAROM right knee lacking 10* to 35* limited by pain       Communication   Communication: HOH  Cognition Arousal/Alertness: Awake/alert Behavior During Therapy: WFL for tasks assessed/performed Overall Cognitive Status: Within Functional Limits for tasks assessed  General Comments      Exercises Total Joint Exercises Ankle Circles/Pumps: AROM;Both;10 reps Quad Sets: AROM;Both;10 reps Short Arc QuadSinclair Ship;Right;10 reps Heel Slides: AAROM;Right;5 reps Hip ABduction/ADduction: AAROM;Right;10 reps Straight Leg Raises: AAROM;Right;10 reps   Assessment/Plan    PT Assessment Patient needs continued PT services  PT Problem List Decreased strength;Decreased mobility;Decreased range of motion;Decreased balance;Pain;Decreased knowledge of use of  DME;Decreased knowledge of precautions;Decreased activity tolerance       PT Treatment Interventions Stair training;Gait training;Balance training;DME instruction;Therapeutic exercise;Functional mobility training;Therapeutic activities;Patient/family education    PT Goals (Current goals can be found in the Care Plan section)  Acute Rehab PT Goals PT Goal Formulation: With patient Time For Goal Achievement: 11/17/20 Potential to Achieve Goals: Good    Frequency 7X/week   Barriers to discharge        Co-evaluation               AM-PAC PT "6 Clicks" Mobility  Outcome Measure Help needed turning from your back to your side while in a flat bed without using bedrails?: A Little Help needed moving from lying on your back to sitting on the side of a flat bed without using bedrails?: A Little Help needed moving to and from a bed to a chair (including a wheelchair)?: A Lot Help needed standing up from a chair using your arms (e.g., wheelchair or bedside chair)?: A Lot Help needed to walk in hospital room?: A Lot Help needed climbing 3-5 steps with a railing? : A Lot 6 Click Score: 14    End of Session Equipment Utilized During Treatment: Gait belt Activity Tolerance: Patient tolerated treatment well Patient left: in chair;with call bell/phone within reach;with chair alarm set Nurse Communication: Mobility status PT Visit Diagnosis: Other abnormalities of gait and mobility (R26.89)    Time: 6546-5035 PT Time Calculation (min) (ACUTE ONLY): 28 min   Charges:   PT Evaluation $PT Eval Low Complexity: 1 Low PT Treatments $Therapeutic Exercise: 8-22 mins   Jannette Spanner PT, DPT Acute Rehabilitation Services Pager: [phone] Office: [phone]  Ernesto Lashway,KATHrine E 11/10/2020, 1:04 PM

## 2020-11-10 NOTE — Progress Notes (Addendum)
Subjective: 1 Day Post-Op Procedure(s) (LRB): TOTAL KNEE ARTHROPLASTY (Right) Patient reports pain as mild.   Patient seen in rounds with Dr. Alvan Dame. Patient is well, and has had no acute complaints or problems. No acute events overnight. Foley catheter removed, due to void. Patient was moving around well in bed this morning.  We will start therapy today.   Objective: Vital signs in last 24 hours: Temp:  [97.5 F (36.4 C)-98 F (36.7 C)] 97.5 F (36.4 C) (05/11 0109) Pulse Rate:  [49-87] 49 (05/11 0109) Resp:  [10-18] 16 (05/11 0109) BP: (120-149)/(55-86) 137/86 (05/11 0109) SpO2:  [95 %-100 %] 100 % (05/11 0109) Weight:  [80.3 kg] 80.3 kg (05/10 1041)  Intake/Output from previous day:  Intake/Output Summary (Last 24 hours) at 11/10/2020 0759 Last data filed at 11/10/2020 0600 Gross per 24 hour  Intake 3077.15 ml  Output 1125 ml  Net 1952.15 ml     Intake/Output this shift: No intake/output data recorded.  Labs: Recent Labs    11/10/20 0330  HGB 7.9*   Recent Labs    11/10/20 0330  WBC 4.8  RBC 2.59*  HCT 24.7*  PLT 112*   Recent Labs    11/10/20 0330  NA 137  K 4.2  CL 105  CO2 26  BUN 21  CREATININE 1.06  GLUCOSE 183*  CALCIUM 8.8*   No results for input(s): LABPT, INR in the last 72 hours.  Exam: General - Patient is Alert and Oriented Extremity - Neurologically intact Sensation intact distally Intact pulses distally Dorsiflexion/Plantar flexion intact Dressing - dressing C/D/I Motor Function - intact, moving foot and toes well on exam.   Past Medical History:  Diagnosis Date  . Alcoholism ([place])    Dry 13 years  . Basal cell carcinoma    Right Chest  . BPH (benign prostatic hyperplasia)   . Diverticulosis of colon   . Dizzy spells    none recent  . DJD (degenerative joint disease)    lower back  . Elbow fracture, left 2009   [name]  . GERD (gastroesophageal reflux disease)   . Glaucoma   . History of TIAs 1 yrs ago  .  Hyperkeratosis   . LBP (low back pain)   . Macular degeneration    left wet right dry sees [name]  . Melanoma [facility]) 2009   x3 [name]  . Osteoarthritis   . Restless leg syndrome   . Stroke [facility]) 2005   TIA  . Tinnitus   . Tobacco abuse   . Uses walker   . Wears glasses    reading  . Wears partial dentures    upper    Assessment/Plan: 1 Day Post-Op Procedure(s) (LRB): TOTAL KNEE ARTHROPLASTY (Right) Active Problems:   S/P total knee arthroplasty, right  Estimated body mass index is 24.01 kg/m as calculated from the following:   Height as of this encounter: 6' (1.829 m).   Weight as of this encounter: 80.3 kg. Advance diet Up with therapy D/C IV fluids   Patient's anticipated LOS is less than 2 midnights, meeting these requirements: - Younger than 32 - Lives within 1 hour of care - Has a competent adult at home to recover with post-op recover - NO history of  - Chronic pain requiring opiods  - Diabetes  - Coronary Artery Disease  - Heart failure  - Heart attack  - Stroke  - DVT/VTE  - Cardiac arrhythmia  - Respiratory Failure/COPD  - Renal  failure  - Anemia  - Advanced Liver disease  DVT Prophylaxis - Aspirin Weight bearing as tolerated.  Hemoglobin 9 > 7.9 this AM secondary to ABLA on chronic anemia. Asymptomatic today.  Plan is to go Home after hospital stay. Plan for discharge today following 1-2 sessions of PT as long as they are meeting their goals. Patient is scheduled for OPPT. Follow up in the office in 2 weeks.   He takes Oxycodone 10 mg q6h at home. He has been tolerating Oxycodone 10 q4h here for pain. He will take the remaining pills he has and will call for a refill when needed. Take his medication at home 1 tablet q4h as needed for pain.   Griffith Citron, PA-C Orthopedic Surgery [phone] 11/10/2020, 7:59 AM

## 2020-11-10 NOTE — Progress Notes (Signed)
Physical Therapy Treatment Patient Details Name: Justin [name] [facility]. MRN: [id number] DOB: [birth year] Today's Date: 11/10/2020    History of Present Illness Pt is an 80 year old male s/p Right TKA    PT Comments    Pt assisted with ambulating and had improved left knee extension during gait.  Pt encouraged to have family assist with mobility initially for safety (no falls!) and pt agreeable.  Pt also thoroughly verbally reviewed and therapist demonstrated exercises (which he performed this morning).  Pt eager for d/c home today.   Follow Up Recommendations  Follow surgeon's recommendation for DC plan and follow-up therapies;Supervision/Assistance - 24 hour     Equipment Recommendations  None recommended by PT    Recommendations for Other Services       Precautions / Restrictions Precautions Precautions: Fall;Knee Restrictions Weight Bearing Restrictions: No    Mobility  Bed Mobility Overal bed mobility: Needs Assistance Bed Mobility: Supine to Sit     Supine to sit: Supervision;HOB elevated     General bed mobility comments: pt in recliner    Transfers Overall transfer level: Needs assistance Equipment used: Rolling walker (2 wheeled) Transfers: Sit to/from Stand Sit to Stand: Min guard         General transfer comment: verbal cues for UE and LE positioning, increased time and effort, cues for full extension/upright posture  Ambulation/Gait Ambulation/Gait assistance: Min guard;Min assist Gait Distance (Feet): 70 Feet Assistive device: Rolling walker (2 wheeled) Gait Pattern/deviations: Step-to pattern;Antalgic;Decreased stance time - right     General Gait Details: increased bil knee flexion so provided cues for extension especially with better left leg, occasional steadying assist especially if using hand (needs full grip on RW)   Stairs             Wheelchair Mobility    Modified Rankin (Stroke Patients Only)       Balance                                             Cognition Arousal/Alertness: Awake/alert Behavior During Therapy: WFL for tasks assessed/performed Overall Cognitive Status: Within Functional Limits for tasks assessed                                        Exercises    General Comments        Pertinent Vitals/Pain Pain Assessment: 0-10 Pain Score: 6  Pain Location: posterior right knee Pain Descriptors / Indicators: Sore;Aching Pain Intervention(s): Repositioned;Monitored during session;Ice applied    Home Living Family/patient expects to be discharged to:: Private residence Living Arrangements: Spouse/significant other Available Help at Discharge: Family;Available 24 hours/day Type of Home: House Home Access: Level entry   Home Layout: One level Home Equipment: Walker - 2 wheels;Bedside commode      Prior Function Level of Independence: Independent with assistive device(s)          PT Goals (current goals can now be found in the care plan section) Acute Rehab PT Goals PT Goal Formulation: With patient Time For Goal Achievement: 11/17/20 Potential to Achieve Goals: Good Progress towards PT goals: Progressing toward goals    Frequency    7X/week      PT Plan Current plan remains appropriate    Co-evaluation  AM-PAC PT "6 Clicks" Mobility   Outcome Measure  Help needed turning from your back to your side while in a flat bed without using bedrails?: A Little Help needed moving from lying on your back to sitting on the side of a flat bed without using bedrails?: A Little Help needed moving to and from a bed to a chair (including a wheelchair)?: A Little Help needed standing up from a chair using your arms (e.g., wheelchair or bedside chair)?: A Little Help needed to walk in hospital room?: A Little Help needed climbing 3-5 steps with a railing? : A Lot 6 Click Score: 17    End of Session Equipment Utilized During  Treatment: Gait belt Activity Tolerance: Patient tolerated treatment well Patient left: in chair;with call bell/phone within reach;with chair alarm set Nurse Communication: Mobility status PT Visit Diagnosis: Other abnormalities of gait and mobility (R26.89)     Time: 9417-4081 PT Time Calculation (min) (ACUTE ONLY): 24 min  Charges:  $Gait Training: 8-22 mins $Therapeutic Exercise: 8-22 mins                     Jannette Spanner PT, DPT Acute Rehabilitation Services Pager: [phone] Office: [phone]   York Ram E 11/10/2020, 1:21 PM

## 2020-11-10 NOTE — Progress Notes (Signed)
Patient discharged to home w/ family. Given all belongings, instructions. Verbalized understanding of instructions. Escorted to pov via w/c. 

## 2020-11-11 ENCOUNTER — Encounter (HOSPITAL_COMMUNITY): Payer: Self-pay | Admitting: Orthopedic Surgery

## 2020-11-15 NOTE — Discharge Summary (Signed)
Physician Discharge Summary   Patient ID: Justin [name] MRN: [id number] DOB/AGE: 10/04/1940 80 y.o.  Admit date: 11/09/2020 Discharge date: 11/10/2020  Primary Diagnosis: Right knee osteoarthritis.  Admission Diagnoses:  Past Medical History:  Diagnosis Date  . Alcoholism ([place])    Dry 13 years  . Basal cell carcinoma    Right Chest  . BPH (benign prostatic hyperplasia)   . Diverticulosis of colon   . Dizzy spells    none recent  . DJD (degenerative joint disease)    lower back  . Elbow fracture, left 2009   [name]  . GERD (gastroesophageal reflux disease)   . Glaucoma   . History of TIAs 1 yrs ago  . Hyperkeratosis   . LBP (low back pain)   . Macular degeneration    left wet right dry sees [name]  . Melanoma [facility]) 2009   x3 [name]  . Osteoarthritis   . Restless leg syndrome   . Stroke [facility]) 2005   TIA  . Tinnitus   . Tobacco abuse   . Uses walker   . Wears glasses    reading  . Wears partial dentures    upper   Discharge Diagnoses:   Active Problems:   S/P total knee arthroplasty, right  Estimated body mass index is 24.01 kg/m as calculated from the following:   Height as of this encounter: 6' (1.829 m).   Weight as of this encounter: 80.3 kg.  Procedure:  Procedure(s) (LRB): TOTAL KNEE ARTHROPLASTY (Right)   Consults: None  HPI: Justin [name]. is a 80 y.o. male patient of   mine.  The patient had been seen, evaluated, and treated for months conservatively in the   office with medication, activity modification, and injections.  The patient had   radiographic changes of bone-on-bone arthritis with endplate sclerosis and osteophytes noted.  Based on the radiographic changes and failed conservative measures, the patient   decided to proceed with definitive treatment, total knee replacement.  Risks of infection, DVT, component failure, need for revision surgery, neurovascular injury were reviewed in the office setting.  The  postop course was reviewed stressing the efforts to maximize post-operative satisfaction and function.  Consent was obtained for benefit of pain   relief.   Laboratory Data: Admission on 11/09/2020, Discharged on 11/10/2020  Component Date Value Ref Range Status  . WBC 11/10/2020 4.8  4.0 - 10.5 K/uL Final  . RBC 11/10/2020 2.59* 4.22 - 5.81 MIL/uL Final  . Hemoglobin 11/10/2020 7.9* 13.0 - 17.0 g/dL Final  . HCT 11/10/2020 24.7* 39.0 - 52.0 % Final  . MCV 11/10/2020 95.4  80.0 - 100.0 fL Final  . MCH 11/10/2020 30.5  26.0 - 34.0 pg Final  . MCHC 11/10/2020 32.0  30.0 - 36.0 g/dL Final  . RDW 11/10/2020 13.7  11.5 - 15.5 % Final  . Platelets 11/10/2020 112* 150 - 400 K/uL Final   Comment: SPECIMEN CHECKED FOR CLOTS Immature Platelet Fraction may be clinically indicated, consider ordering this additional test [id number] CONSISTENT WITH PREVIOUS RESULT REPEATED TO VERIFY   . nRBC 11/10/2020 0.0  0.0 - 0.2 % Final   Performed at [facility] [address] [place], [place] [address]  . Sodium 11/10/2020 137  135 - 145 mmol/L Final  . Potassium 11/10/2020 4.2  3.5 - 5.1 mmol/L Final  . Chloride 11/10/2020 105  98 - 111 mmol/L Final  . CO2 11/10/2020 26  22 - 32  mmol/L Final  . Glucose, Bld 11/10/2020 183* 70 - 99 mg/dL Final   Glucose reference range applies only to samples taken after fasting for at least 8 hours.  . BUN 11/10/2020 21  8 - 23 mg/dL Final  . Creatinine, Ser 11/10/2020 1.06  0.61 - 1.24 mg/dL Final  . Calcium 11/10/2020 8.8* 8.9 - 10.3 mg/dL Final  . GFR, Estimated 11/10/2020 >60  >60 mL/min Final   Comment: (NOTE) Calculated using the CKD-EPI Creatinine Equation (2021)   . Anion gap 11/10/2020 6  5 - 15 Final   Performed at [facility], [place] [address] [place], [place] [address]  Hospital Outpatient Visit on 11/05/2020  Component Date Value Ref Range Status  . SARS Coronavirus 2 11/05/2020 NEGATIVE  NEGATIVE Final    Comment: (NOTE) SARS-CoV-2 target nucleic acids are NOT DETECTED.  The SARS-CoV-2 RNA is generally detectable in upper and lower respiratory specimens during the acute phase of infection. Negative results do not preclude SARS-CoV-2 infection, do not rule out co-infections with other pathogens, and should not be used as the sole basis for treatment or other patient management decisions. Negative results must be combined with clinical observations, patient history, and epidemiological information. The expected result is Negative.  Fact Sheet for Patients: SugarRoll.be  Fact Sheet for Healthcare Providers: https://www.woods-mathews.com/  This test is not yet approved or cleared by the [place] FDA and  has been authorized for detection and/or diagnosis of SARS-CoV-2 by FDA under an Emergency Use Authorization (EUA). This EUA will remain  in effect (meaning this test can be used) for the duration of the COVID-19 declaration under Se                          ction 564(b)(1) of the Act, 21 U.S.C. section 360bbb-3(b)(1), unless the authorization is terminated or revoked sooner.  Performed at [facility], [place] [address] [place], [place] [address]   Hospital Outpatient Visit on 10/28/2020  Component Date Value Ref Range Status  . WBC 10/28/2020 3.0* 4.0 - 10.5 K/uL Final  . RBC 10/28/2020 2.90* 4.22 - 5.81 MIL/uL Final  . Hemoglobin 10/28/2020 9.0* 13.0 - 17.0 g/dL Final  . HCT 10/28/2020 27.4* 39.0 - 52.0 % Final  . MCV 10/28/2020 94.5  80.0 - 100.0 fL Final  . MCH 10/28/2020 31.0  26.0 - 34.0 pg Final  . MCHC 10/28/2020 32.8  30.0 - 36.0 g/dL Final  . RDW 10/28/2020 13.8  11.5 - 15.5 % Final  . Platelets 10/28/2020 137* 150 - 400 K/uL Final  . nRBC 10/28/2020 0.0  0.0 - 0.2 % Final   Performed at [facility], [place] [address], [place],  [address]  . Sodium 10/28/2020 139  135 - 145 mmol/L Final  .  Potassium 10/28/2020 4.1  3.5 - 5.1 mmol/L Final  . Chloride 10/28/2020 107  98 - 111 mmol/L Final  . CO2 10/28/2020 25  22 - 32 mmol/L Final  . Glucose, Bld 10/28/2020 101* 70 - 99 mg/dL Final   Glucose reference range applies only to samples taken after fasting for at least 8 hours.  . BUN 10/28/2020 18  8 - 23 mg/dL Final  . Creatinine, Ser 10/28/2020 1.01  0.61 - 1.24 mg/dL Final  . Calcium 10/28/2020 9.2  8.9 - 10.3 mg/dL Final  . Total Protein 10/28/2020 6.9  6.5 - 8.1 g/dL Final  . Albumin 10/28/2020 4.0  3.5 - 5.0  g/dL Final  . AST 10/28/2020 19  15 - 41 U/L Final  . ALT 10/28/2020 10  0 - 44 U/L Final  . Alkaline Phosphatase 10/28/2020 60  38 - 126 U/L Final  . Total Bilirubin 10/28/2020 0.8  0.3 - 1.2 mg/dL Final  . GFR, Estimated 10/28/2020 >60  >60 mL/min Final   Comment: (NOTE) Calculated using the CKD-EPI Creatinine Equation (2021)   . Anion gap 10/28/2020 7  5 - 15 Final   Performed at [facility], [place] [address], [place], [place] [address]  . Prothrombin Time 10/28/2020 14.3  11.4 - 15.2 seconds Final  . INR 10/28/2020 1.1  0.8 - 1.2 Final   Comment: (NOTE) INR goal varies based on device and disease states. Performed at [facility], [place] [address], [place], [place] [address]   . aPTT 10/28/2020 35  24 - 36 seconds Final   Performed at [facility], [place] [address] [place], [place] [address]  . ABO/RH(D) 10/28/2020 O NEG   Final  . Antibody Screen 10/28/2020 NEG   Final  . Sample Expiration 10/28/2020 11/11/2020,2359   Final  . Extend sample reason 10/28/2020    Final                   Value:NO TRANSFUSIONS OR PREGNANCY IN THE PAST 3 MONTHS Performed at [facility] [address], [place], [place] [address]   . MRSA, PCR 10/28/2020 NEGATIVE  NEGATIVE Final  . Staphylococcus aureus 10/28/2020 POSITIVE* NEGATIVE Final   Comment: (NOTE) The Xpert SA Assay (FDA approved for NASAL  specimens in patients 25 years of age and older), is one component of a comprehensive surveillance program. It is not intended to diagnose infection nor to guide or monitor treatment. Performed at [facility], [place] [address], [place], [place] [address]      X-Rays:No results found.  EKG: Orders placed or performed during the hospital encounter of 10/28/20  . EKG 12-Lead  . EKG 12-Lead     Hospital Course: Justin [name]. is a 80 y.o. who was admitted to [facility]. They were brought to the operating room on 11/09/2020 and underwent Procedure(s): TOTAL KNEE ARTHROPLASTY.  Patient tolerated the procedure well and was later transferred to the recovery room and then to the orthopaedic floor for postoperative care. They were given PO and IV analgesics for pain control following their surgery. They were given 24 hours of postoperative antibiotics of  Anti-infectives (From admission, onward)   Start     Dose/Rate Route Frequency Ordered Stop   11/09/20 1900  ceFAZolin (ANCEF) IVPB 2g/100 mL premix        2 g 200 mL/hr over 30 Minutes Intravenous Every 6 hours 11/09/20 1624 11/10/20 0136   11/09/20 1030  ceFAZolin (ANCEF) IVPB 2g/100 mL premix        2 g 200 mL/hr over 30 Minutes Intravenous On call to O.R. 11/09/20 1026 11/09/20 1331     and started on DVT prophylaxis in the form of Aspirin.   PT and OT were ordered for total joint protocol. Discharge planning consulted to help with postop disposition and equipment needs.  Patient had a good night on the evening of surgery. They started to get up OOB with therapy on POD #1. He was noted to have asymptomatic ABLA in the setting of chronic anemia. Pt was seen during rounds and was ready to go home pending progress with  therapy.he worked with therapy on POD #1 and was meeting his goals. Pt was discharged to home later that day in stable condition.  Diet: Regular diet Activity: WBAT Follow-up: in 2  days Disposition: Home Discharged Condition: good   Discharge Instructions    Call MD / Call 911   Complete by: As directed    If you experience chest pain or shortness of breath, CALL 911 and be transported to the hospital emergency room.  If you develope a fever above 101 F, pus (white drainage) or increased drainage or redness at the wound, or calf pain, call your surgeon's office.   Change dressing   Complete by: As directed    Maintain surgical dressing until follow up in the clinic. If the edges start to pull up, may reinforce with tape. If the dressing is no longer working, may remove and cover with gauze and tape, but must keep the area dry and clean.  Call with any questions or concerns.   Constipation Prevention   Complete by: As directed    Drink plenty of fluids.  Prune juice may be helpful.  You may use a stool softener, such as Colace (over the counter) 100 mg twice a day.  Use MiraLax (over the counter) for constipation as needed.   Diet - low sodium heart healthy   Complete by: As directed    Increase activity slowly as tolerated   Complete by: As directed    Weight bearing as tolerated with assist device (walker, cane, etc) as directed, use it as long as suggested by your surgeon or therapist, typically at least 4-6 weeks.   Post-operative opioid taper instructions:   Complete by: As directed    POST-OPERATIVE OPIOID TAPER INSTRUCTIONS: It is important to wean off of your opioid medication as soon as possible. If you do not need pain medication after your surgery it is ok to stop day one. Opioids include: Codeine, Hydrocodone(Norco, Vicodin), Oxycodone(Percocet, oxycontin) and hydromorphone amongst others.  Long term and even short term use of opiods can cause: Increased pain response Dependence Constipation Depression Respiratory depression And more.  Withdrawal symptoms can include Flu like symptoms Nausea, vomiting And more Techniques to manage these  symptoms Hydrate well Eat regular healthy meals Stay active Use relaxation techniques(deep breathing, meditating, yoga) Do Not substitute Alcohol to help with tapering If you have been on opioids for less than two weeks and do not have pain than it is ok to stop all together.  Plan to wean off of opioids This plan should start within one week post op of your joint replacement. Maintain the same interval or time between taking each dose and first decrease the dose.  Cut the total daily intake of opioids by one tablet each day Next start to increase the time between doses. The last dose that should be eliminated is the evening dose.      TED hose   Complete by: As directed    Use stockings (TED hose) for 2 weeks on both leg(s).  You may remove them at night for sleeping.     Allergies as of 11/10/2020      Reactions   Levofloxacin    REACTION: hands pealed      Medication List    STOP taking these medications   aspirin 81 MG EC tablet Replaced by: aspirin 81 MG chewable tablet   HYDROcodone-acetaminophen 7.5-325 MG tablet Commonly known as: [place] these medications   aspirin 81  MG chewable tablet Chew 1 tablet (81 mg total) by mouth 2 (two) times daily for 28 days. Then resume normal dose of aspirin 81 mg Replaces: aspirin 81 MG EC tablet   carbidopa-levodopa 25-100 MG tablet Commonly known as: SINEMET IR For breakthrough restless leg, you can take 1 tablet at bedtime.  OK to take extra tab as needed during the day. What changed:   how much to take  how to take this  when to take this  additional instructions   Cholecalciferol 25 MCG (1000 UT) tablet Take 3,000 Units by mouth daily.   docusate sodium 100 MG capsule Commonly known as: COLACE Take 1 capsule (100 mg total) by mouth 2 (two) times daily.   famotidine 20 MG tablet Commonly known as: PEPCID Take 20 mg by mouth as needed for heartburn or indigestion. [facility] brand pepcid   ferrous sulfate 325  (65 FE) MG tablet Take 650 mg by mouth daily with breakfast.   finasteride 5 MG tablet Commonly known as: PROSCAR TAKE 1 TABLET BY MOUTH EVERY DAY What changed: when to take this   gabapentin 300 MG capsule Commonly known as: NEURONTIN TAKE 1 CAPSULE (300 MG TOTAL) BY MOUTH 4 (FOUR) TIMES DAILY. What changed: when to take this   meclizine 12.5 MG tablet Commonly known as: ANTIVERT TAKE 1 TABLET (12.5 MG TOTAL) BY MOUTH 3 (THREE) TIMES DAILY AS NEEDED FOR DIZZINESS.   methocarbamol 500 MG tablet Commonly known as: ROBAXIN Take 1 tablet (500 mg total) by mouth every 6 (six) hours as needed for muscle spasms.   multivitamin with minerals Tabs tablet Take 1 tablet by mouth daily.   Oxycodone HCl 10 MG Tabs Take 10 mg by mouth 4 (four) times daily as needed (pain).   polyethylene glycol 17 g packet Commonly known as: MIRALAX / GLYCOLAX Take 17 g by mouth daily as needed for mild constipation.   pravastatin 20 MG tablet Commonly known as: PRAVACHOL TAKE 1 TABLET BY MOUTH EVERY DAY   PRESERVISION AREDS PO Take 2 tablets by mouth 2 (two) times daily.   rOPINIRole 2 MG tablet Commonly known as: REQUIP TAKE 1 TABLET BY MOUTH AT 12PM, 1 TABLET AT 4PM AND 1 TABLETS AT BEDTIME What changed:   how much to take  how to take this  when to take this  additional instructions   SYSTANE OP Place 1 drop into both eyes 2 (two) times daily as needed (dry eyes).   tamsulosin 0.4 MG Caps capsule Commonly known as: FLOMAX TAKE 1 CAPSULE BY MOUTH EVERY DAY What changed: when to take this   terazosin 2 MG capsule Commonly known as: HYTRIN TAKE 1 CAPSULE BY MOUTH EVERYDAY AT BEDTIME What changed: See the new instructions.   Vitamin B-12 1000 MCG Subl DISSOLVE 2 TABLETS IN MOUTH EVERY DAY What changed: See the new instructions.            Discharge Care Instructions  (From admission, onward)         Start     Ordered   11/10/20 0000  Change dressing       Comments:  Maintain surgical dressing until follow up in the clinic. If the edges start to pull up, may reinforce with tape. If the dressing is no longer working, may remove and cover with gauze and tape, but must keep the area dry and clean.  Call with any questions or concerns.   11/10/20 0815          Follow-up  Information    [name], MD. Daphane Shepherd on 11/24/2020.   Specialty: Orthopedic Surgery Why: You are scheduled for first post op appointment on Wednesday May 25th at 10:00am. Contact information: [address] [place] [address] [phone]        Rosilyn Mings.. Go on 11/12/2020.   Why: You are scheduled for physical therapy eval on Friday May 13th at 11:30am. Contact information: [place] [address] [phone]               Signed: Griffith Citron, PA-C Orthopedic Surgery 11/15/2020, 3:17 PM

## 2020-11-17 ENCOUNTER — Other Ambulatory Visit (INDEPENDENT_AMBULATORY_CARE_PROVIDER_SITE_OTHER): Payer: PPO

## 2020-11-17 ENCOUNTER — Telehealth: Payer: Self-pay | Admitting: Internal Medicine

## 2020-11-17 ENCOUNTER — Other Ambulatory Visit: Payer: Self-pay

## 2020-11-17 DIAGNOSIS — R350 Frequency of micturition: Secondary | ICD-10-CM | POA: Diagnosis not present

## 2020-11-17 LAB — URINALYSIS, ROUTINE W REFLEX MICROSCOPIC
Bilirubin Urine: NEGATIVE
Hgb urine dipstick: NEGATIVE
Leukocytes,Ua: NEGATIVE
Nitrite: NEGATIVE
RBC / HPF: NONE SEEN (ref 0–?)
Specific Gravity, Urine: 1.015 (ref 1.000–1.030)
Total Protein, Urine: NEGATIVE
Urine Glucose: NEGATIVE
Urobilinogen, UA: 0.2 (ref 0.0–1.0)
pH: 7.5 (ref 5.0–8.0)

## 2020-11-17 NOTE — Telephone Encounter (Signed)
Patients sister called and was wondering if something could be called in for frequent urination and burning when urinating.  It can be sent to [id number] - [place], [place]. Please advise

## 2020-11-17 NOTE — Telephone Encounter (Signed)
Urine sample dropped off. Please advise on order.

## 2020-11-17 NOTE — Telephone Encounter (Signed)
  Seeking advice  Patient calling to report frequent urination. He recently had knee surgery, unable to walk. Can urine test be ordered?

## 2020-11-18 ENCOUNTER — Ambulatory Visit: Payer: PPO | Admitting: Podiatry

## 2020-11-18 DIAGNOSIS — Z7982 Long term (current) use of aspirin: Secondary | ICD-10-CM | POA: Diagnosis not present

## 2020-11-18 DIAGNOSIS — N200 Calculus of kidney: Secondary | ICD-10-CM | POA: Diagnosis not present

## 2020-11-18 DIAGNOSIS — Z96651 Presence of right artificial knee joint: Secondary | ICD-10-CM | POA: Diagnosis not present

## 2020-11-18 DIAGNOSIS — B351 Tinea unguium: Secondary | ICD-10-CM | POA: Diagnosis not present

## 2020-11-18 DIAGNOSIS — H353221 Exudative age-related macular degeneration, left eye, with active choroidal neovascularization: Secondary | ICD-10-CM | POA: Diagnosis not present

## 2020-11-18 DIAGNOSIS — K5730 Diverticulosis of large intestine without perforation or abscess without bleeding: Secondary | ICD-10-CM | POA: Diagnosis not present

## 2020-11-18 DIAGNOSIS — M720 Palmar fascial fibromatosis [Dupuytren]: Secondary | ICD-10-CM | POA: Diagnosis not present

## 2020-11-18 DIAGNOSIS — I10 Essential (primary) hypertension: Secondary | ICD-10-CM | POA: Diagnosis not present

## 2020-11-18 DIAGNOSIS — K219 Gastro-esophageal reflux disease without esophagitis: Secondary | ICD-10-CM | POA: Diagnosis not present

## 2020-11-18 DIAGNOSIS — D649 Anemia, unspecified: Secondary | ICD-10-CM | POA: Diagnosis not present

## 2020-11-18 DIAGNOSIS — E559 Vitamin D deficiency, unspecified: Secondary | ICD-10-CM | POA: Diagnosis not present

## 2020-11-18 DIAGNOSIS — H353111 Nonexudative age-related macular degeneration, right eye, early dry stage: Secondary | ICD-10-CM | POA: Diagnosis not present

## 2020-11-18 DIAGNOSIS — G629 Polyneuropathy, unspecified: Secondary | ICD-10-CM | POA: Diagnosis not present

## 2020-11-18 DIAGNOSIS — F5104 Psychophysiologic insomnia: Secondary | ICD-10-CM | POA: Diagnosis not present

## 2020-11-18 DIAGNOSIS — I739 Peripheral vascular disease, unspecified: Secondary | ICD-10-CM | POA: Diagnosis not present

## 2020-11-18 DIAGNOSIS — H43812 Vitreous degeneration, left eye: Secondary | ICD-10-CM | POA: Diagnosis not present

## 2020-11-18 DIAGNOSIS — M545 Low back pain, unspecified: Secondary | ICD-10-CM | POA: Diagnosis not present

## 2020-11-18 DIAGNOSIS — G2581 Restless legs syndrome: Secondary | ICD-10-CM | POA: Diagnosis not present

## 2020-11-18 DIAGNOSIS — N400 Enlarged prostate without lower urinary tract symptoms: Secondary | ICD-10-CM | POA: Diagnosis not present

## 2020-11-18 DIAGNOSIS — Z471 Aftercare following joint replacement surgery: Secondary | ICD-10-CM | POA: Diagnosis not present

## 2020-11-18 DIAGNOSIS — Z87440 Personal history of urinary (tract) infections: Secondary | ICD-10-CM | POA: Diagnosis not present

## 2020-11-18 DIAGNOSIS — F32A Depression, unspecified: Secondary | ICD-10-CM | POA: Diagnosis not present

## 2020-11-18 DIAGNOSIS — I2510 Atherosclerotic heart disease of native coronary artery without angina pectoris: Secondary | ICD-10-CM | POA: Diagnosis not present

## 2020-11-18 MED ORDER — PHENAZOPYRIDINE HCL 100 MG PO TABS: 100.0000 mg | ORAL_TABLET | Freq: Three times a day (TID) | ORAL | 1 refills | Status: DC | PRN

## 2020-11-18 NOTE — Telephone Encounter (Signed)
Okay to use Pyridium if over-the-counter Azo does not work.  I have already sent him a lab message with my suggestions. I will email a prescription for Pyridium Thanks

## 2020-11-18 NOTE — Addendum Note (Signed)
Addended by: [name] on: 11/18/2020 12:17 AM   Modules accepted: Orders

## 2020-11-18 NOTE — Telephone Encounter (Signed)
Notified pt w/MD response.../lmb 

## 2020-11-22 ENCOUNTER — Ambulatory Visit: Payer: PPO | Admitting: Internal Medicine

## 2020-11-27 ENCOUNTER — Other Ambulatory Visit: Payer: Self-pay | Admitting: Internal Medicine

## 2020-12-02 ENCOUNTER — Ambulatory Visit (INDEPENDENT_AMBULATORY_CARE_PROVIDER_SITE_OTHER): Payer: PPO | Admitting: Ophthalmology

## 2020-12-02 ENCOUNTER — Encounter (INDEPENDENT_AMBULATORY_CARE_PROVIDER_SITE_OTHER): Payer: Self-pay | Admitting: Ophthalmology

## 2020-12-02 ENCOUNTER — Other Ambulatory Visit: Payer: Self-pay

## 2020-12-02 DIAGNOSIS — H353111 Nonexudative age-related macular degeneration, right eye, early dry stage: Secondary | ICD-10-CM

## 2020-12-02 DIAGNOSIS — H353221 Exudative age-related macular degeneration, left eye, with active choroidal neovascularization: Secondary | ICD-10-CM

## 2020-12-02 MED ORDER — AFLIBERCEPT 2MG/0.05ML IZ SOLN FOR KALEIDOSCOPE
2.0000 mg | INTRAVITREAL | Status: AC | PRN
  Administered 2020-12-02: 2 mg via INTRAVITREAL

## 2020-12-02 NOTE — Assessment & Plan Note (Signed)
At 8-week follow-up, much less subretinal fluid.  The serous retinal detachment component of this could have been somewhat stress-induced as now patient has much less pain post recent knee replacement surgery in the right  In any case macular findings in the left eye are vastly improved at 8-week follow-up.  Repeat injection today and follow-up again in 10 weeks

## 2020-12-02 NOTE — Progress Notes (Signed)
12/02/2020     CHIEF COMPLAINT Patient presents for Retina Follow Up (8 week fu os and eylea os/Pt states [facility] OU stable since last visit. Pt denies FOL, floaters, or ocular pain OU. /)   HISTORY OF PRESENT ILLNESS: Justin [name]. is a 80 y.o. male who presents to the clinic today for:   HPI    Retina Follow Up    Diagnosis: Wet AMD   Laterality: left eye   Onset: 8 weeks ago   Severity: mild   Duration: 8 weeks   Course: stable   Comments: 8 week fu os and eylea os Pt states VA OU stable since last visit. Pt denies FOL, floaters, or ocular pain OU.         Last edited by Kendra Opitz, COA on 12/02/2020  8:11 AM. (History)      Referring physician: [name], MD [place],  [place] [id number]  HISTORICAL INFORMATION:   Selected notes from the MEDICAL RECORD NUMBER    Lab Results  Component Value Date   HGBA1C 5.6 07/28/2015     CURRENT MEDICATIONS: Current Outpatient Medications (Ophthalmic Drugs)  Medication Sig  . Polyethyl Glycol-Propyl Glycol (SYSTANE OP) Place 1 drop into both eyes 2 (two) times daily as needed (dry eyes).   No current facility-administered medications for this visit. (Ophthalmic Drugs)   Current Outpatient Medications (Other)  Medication Sig  . aspirin 81 MG chewable tablet Chew 1 tablet (81 mg total) by mouth 2 (two) times daily for 28 days. Then resume normal dose of aspirin 81 mg  . carbidopa-levodopa (SINEMET IR) 25-100 MG tablet For breakthrough restless leg, you can take 1 tablet at bedtime.  OK to take extra tab as needed during the day. (Patient taking differently: Take 1 tablet by mouth at bedtime.)  . Cholecalciferol 1000 UNITS tablet Take 3,000 Units by mouth daily.  . Cyanocobalamin (VITAMIN B-12) 1000 MCG SUBL DISSOLVE 2 TABLETS IN MOUTH EVERY DAY (Patient taking differently: Take 2,000 mcg by mouth daily.)  . docusate sodium (COLACE) 100 MG capsule Take 1 capsule (100 mg total) by mouth 2 (two)  times daily.  . famotidine (PEPCID) 20 MG tablet Take 20 mg by mouth as needed for heartburn or indigestion. [facility] brand pepcid  . ferrous sulfate 325 (65 FE) MG tablet Take 650 mg by mouth daily with breakfast.  . finasteride (PROSCAR) 5 MG tablet TAKE 1 TABLET BY MOUTH EVERY DAY (Patient taking differently: Take 5 mg by mouth in the morning.)  . gabapentin (NEURONTIN) 300 MG capsule TAKE 1 CAPSULE (300 MG TOTAL) BY MOUTH 4 (FOUR) TIMES DAILY. (Patient taking differently: Take 300 mg by mouth 4 (four) times daily -  before meals and at bedtime.)  . meclizine (ANTIVERT) 12.5 MG tablet TAKE 1 TABLET (12.5 MG TOTAL) BY MOUTH 3 (THREE) TIMES DAILY AS NEEDED FOR DIZZINESS.  . methocarbamol (ROBAXIN) 500 MG tablet Take 1 tablet (500 mg total) by mouth every 6 (six) hours as needed for muscle spasms.  . Multiple Vitamin (MULTIVITAMIN WITH MINERALS) TABS tablet Take 1 tablet by mouth daily.  . Multiple Vitamins-Minerals (PRESERVISION AREDS PO) Take 2 tablets by mouth 2 (two) times daily.  . Oxycodone HCl 10 MG TABS Take 10 mg by mouth 4 (four) times daily as needed (pain).  . phenazopyridine (PYRIDIUM) 100 MG tablet Take 1 tablet (100 mg total) by mouth 3 (three) times daily as needed for pain.  . polyethylene glycol (MIRALAX /  GLYCOLAX) 17 g packet Take 17 g by mouth daily as needed for mild constipation.  . pravastatin (PRAVACHOL) 20 MG tablet TAKE 1 TABLET BY MOUTH EVERY DAY (Patient taking differently: Take 20 mg by mouth daily.)  . rOPINIRole (REQUIP) 2 MG tablet TAKE 1 TABLET BY MOUTH AT 12PM, 1 TABLET AT 4PM AND 1 TABLETS AT BEDTIME (Patient taking differently: Take 1-2 mg by mouth See admin instructions. Take 1 mg by mouth with breakfast, lunch, and dinner and 2 mg at bedtime)  . tamsulosin (FLOMAX) 0.4 MG CAPS capsule TAKE 1 CAPSULE BY MOUTH EVERY DAY (Patient taking differently: Take 0.4 mg by mouth in the morning.)  . terazosin (HYTRIN) 2 MG capsule TAKE 1 CAPSULE BY MOUTH EVERYDAY AT BEDTIME   No  current facility-administered medications for this visit. (Other)      REVIEW OF SYSTEMS:    ALLERGIES Allergies  Allergen Reactions  . Levofloxacin     REACTION: hands pealed    PAST MEDICAL HISTORY Past Medical History:  Diagnosis Date  . Alcoholism ([place])    Dry 13 years  . Basal cell carcinoma    Right Chest  . BPH (benign prostatic hyperplasia)   . Diverticulosis of colon   . Dizzy spells    none recent  . DJD (degenerative joint disease)    lower back  . Elbow fracture, left 2009   [name]  . GERD (gastroesophageal reflux disease)   . Glaucoma   . History of TIAs 1 yrs ago  . Hyperkeratosis   . LBP (low back pain)   . Macular degeneration    left wet right dry sees [name]  . Melanoma [facility]) 2009   x3 [name]  . Osteoarthritis   . Restless leg syndrome   . Stroke [facility]) 2005   TIA  . Tinnitus   . Tobacco abuse   . Uses walker   . Wears glasses    reading  . Wears partial dentures    upper   Past Surgical History:  Procedure Laterality Date  . CATARACT EXTRACTION Bilateral 2018  . HARDWARE REMOVAL Left 07/09/2020   Procedure: Left elbow hardware removal;  Surgeon: [name], MD;  Location: [facility];  Service: Orthopedics;  Laterality: Left;  60 mins  . mda     Dr. [name] wet injection  . MELANOMA EXCISION  2009   x3  . RECONSTRUCTION MEDIAL COLLATERAL LIGAMENT ELBOW W/ TENDON GRAFT  2009   Left, [name]  . TONSILLECTOMY  as child  . TOTAL KNEE ARTHROPLASTY Right 11/09/2020   Procedure: TOTAL KNEE ARTHROPLASTY;  Surgeon: [name], MD;  Location: [facility];  Service: Orthopedics;  Laterality: Right;    FAMILY HISTORY Family History  Problem Relation Age of Onset  . Cancer Mother        ?breast  . Cancer Father        Lung    SOCIAL HISTORY Social History   Tobacco Use  . Smoking status: Former Smoker    Packs/day: 3.00    Years: 38.00    Pack years: 114.00    Types: Cigarettes    Quit date:  1986    Years since quitting: 36.4  . Smokeless tobacco: Never Used  . Tobacco comment: states he quit May 15 1985  Vaping Use  . Vaping Use: Never used  Substance Use Topics  . Alcohol use: No    Alcohol/week: 0.0 standard drinks    Comment: PREVIOUS -  DRY 61yrs  . Drug use: Not Currently         OPHTHALMIC EXAM: Base Eye Exam    Visual Acuity (ETDRS)      Right Left   Dist [place] 20/25 +2 20/80 -1   Dist ph [place]  NI       Tonometry (Tonopen, 8:16 AM)      Right Left   Pressure 10 10       Pupils      Pupils Dark Light Shape React APD   Right PERRL 3 2 Round Slow None   Left PERRL 3 2 Round Slow None       Visual Fields (Counting fingers)      Left Right    Full Full       Extraocular Movement      Right Left    Full Full       Neuro/Psych    Oriented x3: Yes   Mood/Affect: Normal       Dilation    Left eye: 1.0% Mydriacyl, 2.5% Phenylephrine @ 8:16 AM        Slit Lamp and Fundus Exam    External Exam      Right Left   External Normal Normal       Slit Lamp Exam      Right Left   Lids/Lashes Normal Normal   Conjunctiva/Sclera White and quiet White and quiet   Cornea Clear Clear   Anterior Chamber Deep and quiet Deep and quiet   Iris Round and reactive Round and reactive   Lens Posterior chamber intraocular lens Posterior chamber intraocular lens   Anterior Vitreous Normal Normal       Fundus Exam      Right Left   Posterior Vitreous  Posterior vitreous detachment   Disc  Normal   C/D Ratio  0.65   Macula  Retinal pigment epithelial detachment, Retinal pigment epithelial mottling, Drusen, Hard drusen, Atrophy,  intermediate age related macular degeneration, Geographic atrophy   Vessels  Normal   Periphery  Normal          IMAGING AND PROCEDURES  Imaging and Procedures for 12/02/20  OCT, Retina - OU - Both Eyes       Right Eye Quality was good. Scan locations included subfoveal. Central Foveal Thickness: 289. Progression has been  stable. Findings include normal foveal contour, retinal drusen , no IRF, no SRF.   Left Eye Quality was good. Scan locations included subfoveal. Central Foveal Thickness: 254. Progression has improved. Findings include no IRF, abnormal foveal contour, no SRF, pigment epithelial detachment.   Notes OS, much improved now at 8-week follow-up.  Coincidentally patient had recent knee surgery some 3 weeks previous and now suffers from less systemic pain, and thus less stress and potentially less adrenaline triggering more issues in this macula left eye       Intravitreal Injection, Pharmacologic Agent - OS - Left Eye       Time Out 12/02/2020. 8:53 AM. Confirmed correct patient, procedure, site, and patient consented.   Anesthesia Topical anesthesia was used. Anesthetic medications included Akten 3.5%.   Procedure Preparation included Tobramycin 0.3%, 5% betadine to ocular surface, 10% betadine to eyelids. A 30 gauge needle was used.   Injection:  2 mg aflibercept Alfonse Flavors) SOLN   NDC: [id number], Lot: [id number]   Route: Intravitreal, Site: Left Eye, Waste: 0 mg  Post-op Post injection exam found visual acuity of at least counting fingers. The patient tolerated the procedure  well. There were no complications. The patient received written and verbal post procedure care education. Post injection medications were not given.                 ASSESSMENT/PLAN:  Exudative age-related macular degeneration of left eye with active choroidal neovascularization (HCC) At 8-week follow-up, much less subretinal fluid.  The serous retinal detachment component of this could have been somewhat stress-induced as now patient has much less pain post recent knee replacement surgery in the right  In any case macular findings in the left eye are vastly improved at 8-week follow-up.  Repeat injection today and follow-up again in 10 weeks  Early stage nonexudative age-related macular degeneration of  right eye No signs of CNVM OD      ICD-10-CM   1. Exudative age-related macular degeneration of left eye with active choroidal neovascularization (HCC)  H35.3221 OCT, Retina - OU - Both Eyes    Intravitreal Injection, Pharmacologic Agent - OS - Left Eye    aflibercept (EYLEA) SOLN 2 mg  2. Early stage nonexudative age-related macular degeneration of right eye  H35.3111     1.  OS, vastly improved with less subretinal fluid at 8-week follow-up interval.  We will repeat injection Eylea today  2.  Dilate OU next, and potential repeat Eylea OS in 10 weeks  3.  Ophthalmic Meds Ordered this visit:  Meds ordered this encounter  Medications  . aflibercept (EYLEA) SOLN 2 mg       Return in about 10 weeks (around 02/10/2021) for DILATE OU, EYLEA OCT, OS.  There are no Patient Instructions on file for this visit.   Explained the diagnoses, plan, and follow up with the patient and they expressed understanding.  Patient expressed understanding of the importance of proper follow up care.   Clent Demark Kandi Brusseau M.D. Diseases & Surgery of the Retina and Vitreous [facility] 12/02/20     Abbreviations: M myopia (nearsighted); A astigmatism; H hyperopia (farsighted); P presbyopia; Mrx spectacle prescription;  CTL contact lenses; OD right eye; OS left eye; OU both eyes  XT exotropia; ET esotropia; PEK punctate epithelial keratitis; PEE punctate epithelial erosions; DES dry eye syndrome; MGD meibomian gland dysfunction; ATs artificial tears; PFAT's preservative free artificial tears; [place] nuclear sclerotic cataract; PSC posterior subcapsular cataract; ERM epi-retinal membrane; PVD posterior vitreous detachment; RD retinal detachment; DM diabetes mellitus; DR diabetic retinopathy; NPDR non-proliferative diabetic retinopathy; PDR proliferative diabetic retinopathy; CSME clinically significant macular edema; DME diabetic macular edema; dbh dot blot hemorrhages; CWS cotton wool spot; POAG  primary open angle glaucoma; C/D cup-to-disc ratio; HVF humphrey visual field; GVF goldmann visual field; OCT optical coherence tomography; IOP intraocular pressure; BRVO Branch retinal vein occlusion; CRVO central retinal vein occlusion; CRAO central retinal artery occlusion; BRAO branch retinal artery occlusion; RT retinal tear; SB scleral buckle; PPV pars plana vitrectomy; VH Vitreous hemorrhage; PRP panretinal laser photocoagulation; IVK intravitreal kenalog; VMT vitreomacular traction; MH Macular hole;  NVD neovascularization of the disc; NVE neovascularization elsewhere; AREDS age related eye disease study; ARMD age related macular degeneration; POAG primary open angle glaucoma; EBMD epithelial/anterior basement membrane dystrophy; ACIOL anterior chamber intraocular lens; IOL intraocular lens; PCIOL posterior chamber intraocular lens; Phaco/IOL phacoemulsification with intraocular lens placement; [place] photorefractive keratectomy; LASIK laser assisted in situ keratomileusis; HTN hypertension; DM diabetes mellitus; COPD chronic obstructive pulmonary disease

## 2020-12-02 NOTE — Assessment & Plan Note (Signed)
No signs of CNVM OD 

## 2020-12-03 DIAGNOSIS — M25561 Pain in right knee: Secondary | ICD-10-CM | POA: Diagnosis not present

## 2020-12-06 DIAGNOSIS — Z5181 Encounter for therapeutic drug level monitoring: Secondary | ICD-10-CM | POA: Diagnosis not present

## 2020-12-06 DIAGNOSIS — Z79899 Other long term (current) drug therapy: Secondary | ICD-10-CM | POA: Diagnosis not present

## 2020-12-06 DIAGNOSIS — M5416 Radiculopathy, lumbar region: Secondary | ICD-10-CM | POA: Diagnosis not present

## 2020-12-09 ENCOUNTER — Other Ambulatory Visit: Payer: Self-pay | Admitting: Internal Medicine

## 2020-12-10 DIAGNOSIS — M25561 Pain in right knee: Secondary | ICD-10-CM | POA: Diagnosis not present

## 2020-12-15 DIAGNOSIS — M25561 Pain in right knee: Secondary | ICD-10-CM | POA: Diagnosis not present

## 2020-12-22 DIAGNOSIS — Z96651 Presence of right artificial knee joint: Secondary | ICD-10-CM | POA: Diagnosis not present

## 2020-12-22 DIAGNOSIS — M25561 Pain in right knee: Secondary | ICD-10-CM | POA: Diagnosis not present

## 2020-12-22 DIAGNOSIS — Z471 Aftercare following joint replacement surgery: Secondary | ICD-10-CM | POA: Diagnosis not present

## 2020-12-26 ENCOUNTER — Other Ambulatory Visit: Payer: Self-pay | Admitting: Internal Medicine

## 2020-12-30 DIAGNOSIS — M25561 Pain in right knee: Secondary | ICD-10-CM | POA: Diagnosis not present

## 2020-12-31 ENCOUNTER — Ambulatory Visit: Payer: Self-pay

## 2020-12-31 NOTE — Chronic Care Management (AMB) (Signed)
  Care Management  Care Management Phone Note  12/31/2020 Name: Justin [name] [facility]. MRN: [id number] DOB: December 22, 1940 [name] is a 80 y.o. year old male who is a primary care patient of Plotnikov, Evie Lacks, MD.   The Care Management team was consulted to assess patient's needs after disconnection with services provided by [facility].  Unsuccessful outreach was made by telephone today to assess patient care needs post discontinuation of [facility].     Plan: The care management team will attempt to reach patient again to assess care needs post [facility] discontinuation.    Tomasa Rand, RN, BSN, CEN [facility] RN Case Manager [phone]

## 2021-01-06 ENCOUNTER — Other Ambulatory Visit: Payer: Self-pay

## 2021-01-06 ENCOUNTER — Ambulatory Visit: Payer: PPO | Admitting: Podiatry

## 2021-01-06 DIAGNOSIS — M79674 Pain in right toe(s): Secondary | ICD-10-CM | POA: Diagnosis not present

## 2021-01-06 DIAGNOSIS — B351 Tinea unguium: Secondary | ICD-10-CM | POA: Diagnosis not present

## 2021-01-06 DIAGNOSIS — M79675 Pain in left toe(s): Secondary | ICD-10-CM

## 2021-01-06 DIAGNOSIS — G629 Polyneuropathy, unspecified: Secondary | ICD-10-CM

## 2021-01-06 DIAGNOSIS — Q828 Other specified congenital malformations of skin: Secondary | ICD-10-CM

## 2021-01-10 NOTE — Progress Notes (Signed)
Subjective: 80 y.o. returns the office today for painful, elongated, thickened toenails which he cannot trim himself and also a painful corn second toe on the left foot which make it difficult to wear shoes at times due to the pressure.  No open lesions or drainage. Denies any redness, drainage or signs of infection. Denies any systemic complaints such as fevers, chills, nausea, vomiting.   Recently had a knee replacement which is doing well.  PCP: [name], MD Last seen October 20, 2020  Objective: AAO 3, NAD DP/PT pulses palpable, CRT less than 3 seconds Neurological status is unchanged.  Nails hypertrophic, dystrophic, elongated, brittle, discolored 10. There is tenderness overlying the nails 1-5 bilaterally. There is no surrounding erythema or drainage along the nail sites. The hyperkeratotic tissue the distal aspect of the left second toe.  No ongoing ulceration drainage or signs of infection.  Think this is was causing more discomfort as opposed to the toenail  There is deformity present the left third toe and the toe is rigid contracture at the level of the DIPJ.   Hammertoes present.  No pain with calf compression, swelling, warmth, erythema.  Assessment: Patient presents with symptomatic onychomycosis; hyperkeratotic lesion; left third toe deformity  Plan: -Treatment options including alternatives, risks, complications were discussed -Nails sharply debrided 10 without complication/bleeding. -Hyperkeratotic lesion sharply debrided x 1 without any complications or bleeding. Continue offloading to the toes.  Dispensed further offloading pads today. -Discussed daily foot inspection. If there are any changes, to call the office immediately.  -Follow-up in 9 weeks or sooner if any problems are to arise. In the meantime, encouraged to call the office with any questions, concerns, changes symptoms.  [name], DPM

## 2021-01-17 ENCOUNTER — Telehealth: Payer: Self-pay

## 2021-01-17 NOTE — Telephone Encounter (Signed)
  Care Management  Care Management Phone Note  01/17/2021 Name: Justin [name] [facility]. MRN: [id number] DOB: [birth year] Justin [name]. is a 80 y.o. year old male who is a primary care patient of Plotnikov, Evie Lacks, MD.   The Care Management team was consulted to assess patient's needs after disconnection with services provided by [facility].  Second unsuccessful outreach was made by telephone today to assess patient care needs post discontinuation of [facility].   Plan: The care management team will attempt to reach patient again to assess care needs post [facility] discontinuation.    Tomasa Rand, RN, BSN, CEN [facility] [employer] [phone]

## 2021-01-21 ENCOUNTER — Telehealth: Payer: Self-pay

## 2021-01-21 NOTE — Telephone Encounter (Signed)
  Care Management  Care Management Phone Note  01/21/2021 Name: Justin [name] [facility]. MRN: [id number] DOB: 10/02/1940 [name]. is a 80 y.o. year old male who is a primary care patient of Plotnikov, Evie Lacks, MD.   The Care Management team was consulted to assess patient's needs after disconnection with services provided by [facility].  Third unsuccessful outreach was made by telephone today to assess patient care needs post discontinuation of [facility].   Plan: The care management team has been unsuccessful x 3 in attempting to reach the patient to assess care needs post [facility] discontinuation. Please use [id number] referral order to refer the patient to the embedded care coordination team if the patient is in need of care management/care coordination needs in the future.    Tomasa Rand, RN, BSN, CEN [facility] [employer] [phone]

## 2021-02-08 ENCOUNTER — Telehealth: Payer: Self-pay | Admitting: Internal Medicine

## 2021-02-08 DIAGNOSIS — D2271 Melanocytic nevi of right lower limb, including hip: Secondary | ICD-10-CM | POA: Diagnosis not present

## 2021-02-08 DIAGNOSIS — D692 Other nonthrombocytopenic purpura: Secondary | ICD-10-CM | POA: Diagnosis not present

## 2021-02-08 DIAGNOSIS — D225 Melanocytic nevi of trunk: Secondary | ICD-10-CM | POA: Diagnosis not present

## 2021-02-08 DIAGNOSIS — D2261 Melanocytic nevi of right upper limb, including shoulder: Secondary | ICD-10-CM | POA: Diagnosis not present

## 2021-02-08 DIAGNOSIS — Z85820 Personal history of malignant melanoma of skin: Secondary | ICD-10-CM | POA: Diagnosis not present

## 2021-02-08 DIAGNOSIS — Z85828 Personal history of other malignant neoplasm of skin: Secondary | ICD-10-CM | POA: Diagnosis not present

## 2021-02-08 DIAGNOSIS — L570 Actinic keratosis: Secondary | ICD-10-CM | POA: Diagnosis not present

## 2021-02-08 NOTE — Chronic Care Management (AMB) (Signed)
  Chronic Care Management   Outreach Note  02/08/2021 Name: Justin [name] [facility]. MRN: [id number] DOB: 1940/11/10  Referred by: [name], MD Reason for referral : No chief complaint on file.   An unsuccessful telephone outreach was attempted today. The patient was referred to the pharmacist for assistance with care management and care coordination.   Follow Up Plan:   Lauretta Grill [employer] Scheduler

## 2021-02-10 ENCOUNTER — Ambulatory Visit (INDEPENDENT_AMBULATORY_CARE_PROVIDER_SITE_OTHER): Payer: PPO | Admitting: Ophthalmology

## 2021-02-10 ENCOUNTER — Encounter (INDEPENDENT_AMBULATORY_CARE_PROVIDER_SITE_OTHER): Payer: Self-pay | Admitting: Ophthalmology

## 2021-02-10 ENCOUNTER — Other Ambulatory Visit: Payer: Self-pay

## 2021-02-10 DIAGNOSIS — H353221 Exudative age-related macular degeneration, left eye, with active choroidal neovascularization: Secondary | ICD-10-CM

## 2021-02-10 MED ORDER — AFLIBERCEPT 2MG/0.05ML IZ SOLN FOR KALEIDOSCOPE
2.0000 mg | INTRAVITREAL | Status: AC | PRN
  Administered 2021-02-10: 2 mg via INTRAVITREAL

## 2021-02-10 NOTE — Assessment & Plan Note (Signed)
Much less involved CNVM OS at 10-week interval post intravitreal Eylea.  Will repeat injection Eylea OS today and extend interval examination OS next to 12 weeks.  Vision limited by subfoveal scarring.  Will continue to monitor for signs of recurrence and vascularization of PED subfoveal

## 2021-02-10 NOTE — Progress Notes (Signed)
02/10/2021     CHIEF COMPLAINT Patient presents for Retina Follow Up   HISTORY OF PRESENT ILLNESS: Justin [name]. is a 80 y.o. male who presents to the clinic today for:   HPI     Retina Follow Up           Diagnosis: Wet AMD   Laterality: left eye   Onset: 10 weeks ago   Severity: mild   Duration: 10 weeks   Course: stable         Comments   10 week fu ou and oct and Eylea OS Pt states VA OU stable since last visit. Pt denies FOL, floaters, or ocular pain OU.        Last edited by Kendra Opitz, COA on 02/10/2021  8:25 AM.      Referring physician: [name], MD [place],  [place] [address]  HISTORICAL INFORMATION:   Selected notes from the MEDICAL RECORD NUMBER    Lab Results  Component Value Date   HGBA1C 5.6 07/28/2015     CURRENT MEDICATIONS: Current Outpatient Medications (Ophthalmic Drugs)  Medication Sig   Polyethyl Glycol-Propyl Glycol (SYSTANE OP) Place 1 drop into both eyes 2 (two) times daily as needed (dry eyes).   No current facility-administered medications for this visit. (Ophthalmic Drugs)   Current Outpatient Medications (Other)  Medication Sig   amoxicillin (AMOXIL) 500 MG capsule amoxicillin 500 mg capsule  4 po 1 hr prior to dental procedure   carbidopa-levodopa (SINEMET IR) 25-100 MG tablet For breakthrough restless leg, you can take 1 tablet at bedtime.  OK to take extra tab as needed during the day. (Patient taking differently: Take 1 tablet by mouth at bedtime.)   Cholecalciferol 1000 UNITS tablet Take 3,000 Units by mouth daily.   Cyanocobalamin (VITAMIN B-12) 1000 MCG SUBL DISSOLVE 2 TABLETS IN MOUTH EVERY DAY (Patient taking differently: Take 2,000 mcg by mouth daily.)   docusate sodium (COLACE) 100 MG capsule Take 1 capsule (100 mg total) by mouth 2 (two) times daily.   famotidine (PEPCID) 20 MG tablet Take 20 mg by mouth as needed for heartburn or indigestion. [facility] brand pepcid   ferrous  sulfate 325 (65 FE) MG tablet Take 650 mg by mouth daily with breakfast.   finasteride (PROSCAR) 5 MG tablet TAKE 1 TABLET BY MOUTH EVERY DAY (Patient taking differently: Take 5 mg by mouth in the morning.)   gabapentin (NEURONTIN) 300 MG capsule TAKE 1 CAPSULE (300 MG TOTAL) BY MOUTH 4 (FOUR) TIMES DAILY. (Patient taking differently: Take 300 mg by mouth 4 (four) times daily -  before meals and at bedtime.)   meclizine (ANTIVERT) 12.5 MG tablet TAKE 1 TABLET BY MOUTH 3 TIMES DAILY AS NEEDED FOR DIZZINESS.   methocarbamol (ROBAXIN) 500 MG tablet Take 1 tablet (500 mg total) by mouth every 6 (six) hours as needed for muscle spasms.   Multiple Vitamin (MULTIVITAMIN WITH MINERALS) TABS tablet Take 1 tablet by mouth daily.   Multiple Vitamins-Minerals (PRESERVISION AREDS PO) Take 2 tablets by mouth 2 (two) times daily.   Oxycodone HCl 10 MG TABS Take 10 mg by mouth 4 (four) times daily as needed (pain).   phenazopyridine (PYRIDIUM) 100 MG tablet Take 1 tablet (100 mg total) by mouth 3 (three) times daily as needed for pain.   polyethylene glycol (MIRALAX / GLYCOLAX) 17 g packet Take 17 g by mouth daily as needed for mild constipation.   pravastatin (PRAVACHOL) 20 MG  tablet TAKE 1 TABLET BY MOUTH EVERY DAY   rOPINIRole (REQUIP) 2 MG tablet TAKE 1 TABLET BY MOUTH AT 12PM, 1 TABLET AT 4PM AND 1 TABLETS AT BEDTIME (Patient taking differently: Take 1-2 mg by mouth See admin instructions. Take 1 mg by mouth with breakfast, lunch, and dinner and 2 mg at bedtime)   tamsulosin (FLOMAX) 0.4 MG CAPS capsule TAKE 1 CAPSULE BY MOUTH EVERY DAY (Patient taking differently: Take 0.4 mg by mouth in the morning.)   terazosin (HYTRIN) 2 MG capsule TAKE 1 CAPSULE BY MOUTH EVERYDAY AT BEDTIME   No current facility-administered medications for this visit. (Other)      REVIEW OF SYSTEMS:    ALLERGIES Allergies  Allergen Reactions   Levofloxacin     REACTION: hands pealed    PAST MEDICAL HISTORY Past Medical  History:  Diagnosis Date   Alcoholism ([place])    Dry 13 years   Basal cell carcinoma    Right Chest   BPH (benign prostatic hyperplasia)    Diverticulosis of colon    Dizzy spells    none recent   DJD (degenerative joint disease)    lower back   Elbow fracture, left 2009   [name]   GERD (gastroesophageal reflux disease)    Glaucoma    History of TIAs 1 yrs ago   Hyperkeratosis    LBP (low back pain)    Macular degeneration    left wet right dry sees [name]   Melanoma [facility]) 2009   x3 [name]   Osteoarthritis    Restless leg syndrome    Stroke [facility]) 2005   TIA   Tinnitus    Tobacco abuse    Uses walker    Wears glasses    reading   Wears partial dentures    upper   Past Surgical History:  Procedure Laterality Date   CATARACT EXTRACTION Bilateral 2018   HARDWARE REMOVAL Left 07/09/2020   Procedure: Left elbow hardware removal;  Surgeon: [name], MD;  Location: [facility];  Service: Orthopedics;  Laterality: Left;  60 mins   mda     Dr. [name] wet injection   MELANOMA EXCISION  2009   x3   RECONSTRUCTION MEDIAL COLLATERAL LIGAMENT ELBOW W/ TENDON GRAFT  2009   Left, [name]   TONSILLECTOMY  as child   TOTAL KNEE ARTHROPLASTY Right 11/09/2020   Procedure: TOTAL KNEE ARTHROPLASTY;  Surgeon: [name], MD;  Location: [facility];  Service: Orthopedics;  Laterality: Right;    FAMILY HISTORY Family History  Problem Relation Age of Onset   Cancer Mother        ?breast   Cancer Father        Lung    SOCIAL HISTORY Social History   Tobacco Use   Smoking status: Former    Packs/day: 3.00    Years: 38.00    Pack years: 114.00    Types: Cigarettes    Quit date: 1986    Years since quitting: 36.6   Smokeless tobacco: Never   Tobacco comments:    states he quit May 15 1985  Vaping Use   Vaping Use: Never used  Substance Use Topics   Alcohol use: No    Alcohol/week: 0.0 standard drinks    Comment: PREVIOUS - DRY 43yr    Drug use: Not Currently         OPHTHALMIC EXAM:  Base Eye Exam     Visual Acuity (ETDRS)  Right Left   Dist [place] 20/25 -1 20/200 -1   Dist ph [place]  20/100 -1    Correction: Glasses         Tonometry (Tonopen, 8:29 AM)       Right Left   Pressure 15 15         Pupils       Pupils Dark Light Shape React APD   Right PERRL 3 2 Round Brisk None   Left PERRL 3 2 Round Brisk None         Visual Fields (Counting fingers)       Left Right    Full Full         Extraocular Movement       Right Left    Full Full         Neuro/Psych     Oriented x3: Yes   Mood/Affect: Normal         Dilation     Left eye: 1.0% Mydriacyl, 2.5% Phenylephrine @ 8:29 AM           Slit Lamp and Fundus Exam     External Exam       Right Left   External Normal Normal         Slit Lamp Exam       Right Left   Lids/Lashes Normal Normal   Conjunctiva/Sclera White and quiet White and quiet   Cornea Clear Clear   Anterior Chamber Deep and quiet Deep and quiet   Iris Round and reactive Round and reactive   Lens Posterior chamber intraocular lens Posterior chamber intraocular lens   Anterior Vitreous Normal Normal         Fundus Exam       Right Left   Posterior Vitreous  Posterior vitreous detachment   Disc  Normal   C/D Ratio  0.65   Macula  Retinal pigment epithelial detachment, Retinal pigment epithelial mottling, Drusen, Hard drusen, Atrophy,  intermediate age related macular degeneration, Geographic atrophy   Vessels  Normal   Periphery  Normal            IMAGING AND PROCEDURES  Imaging and Procedures for 02/10/21  OCT, Retina - OU - Both Eyes       Right Eye Quality was good. Scan locations included subfoveal. Central Foveal Thickness: 289. Progression has been stable. Findings include normal foveal contour, retinal drusen , no IRF, no SRF.   Left Eye Quality was good. Scan locations included subfoveal. Central Foveal Thickness:  235. Progression has improved. Findings include no IRF, abnormal foveal contour, no SRF, pigment epithelial detachment.   Notes OS, much improved now at 10-week follow-up.  Coincidentally patient had recent knee surgery some 13 weeks previous and now suffers from less systemic pain, and thus less stress and potentially less adrenaline triggering more issues in this macula left eye     Intravitreal Injection, Pharmacologic Agent - OS - Left Eye       Time Out 02/10/2021. 8:56 AM. Confirmed correct patient, procedure, site, and patient consented.   Anesthesia Topical anesthesia was used. Anesthetic medications included Akten 3.5%.   Procedure Preparation included Tobramycin 0.3%, 5% betadine to ocular surface, 10% betadine to eyelids. A 30 gauge needle was used.   Injection: 2 mg aflibercept 2 MG/0.05ML   Route: Intravitreal, Site: Left Eye   NDC: [id number], Lot: [id number], Waste: 0 mL   Post-op Post injection exam found visual acuity of at least counting fingers. The patient  tolerated the procedure well. There were no complications. The patient received written and verbal post procedure care education. Post injection medications were not given.              ASSESSMENT/PLAN:  Exudative age-related macular degeneration of left eye with active choroidal neovascularization (HCC) Much less involved CNVM OS at 10-week interval post intravitreal Eylea.  Will repeat injection Eylea OS today and extend interval examination OS next to 12 weeks.  Vision limited by subfoveal scarring.  Will continue to monitor for signs of recurrence and vascularization of PED subfoveal     ICD-10-CM   1. Exudative age-related macular degeneration of left eye with active choroidal neovascularization (HCC)  H35.3221 OCT, Retina - OU - Both Eyes    Intravitreal Injection, Pharmacologic Agent - OS - Left Eye    aflibercept (EYLEA) SOLN 2 mg      1.  OS vastly improved macular anatomy with  chronic subfoveal scarring accounting for acuity.  At 10-week follow-up today post Eylea.  Repeat injection today and extend interval examination OS next to 12 weeks  2.  3.  Ophthalmic Meds Ordered this visit:  Meds ordered this encounter  Medications   aflibercept (EYLEA) SOLN 2 mg       Return in about 12 weeks (around 05/05/2021) for DILATE OU, EYLEA OCT, OS.  There are no Patient Instructions on file for this visit.   Explained the diagnoses, plan, and follow up with the patient and they expressed understanding.  Patient expressed understanding of the importance of proper follow up care.   Clent Demark Electra Paladino M.D. Diseases & Surgery of the Retina and Vitreous [facility] [place] 02/10/21     Abbreviations: M myopia (nearsighted); A astigmatism; H hyperopia (farsighted); P presbyopia; Mrx spectacle prescription;  CTL contact lenses; OD right eye; OS left eye; OU both eyes  XT exotropia; ET esotropia; PEK punctate epithelial keratitis; PEE punctate epithelial erosions; DES dry eye syndrome; MGD meibomian gland dysfunction; ATs artificial tears; PFAT's preservative free artificial tears; [place] nuclear sclerotic cataract; PSC posterior subcapsular cataract; ERM epi-retinal membrane; PVD posterior vitreous detachment; RD retinal detachment; DM diabetes mellitus; DR diabetic retinopathy; NPDR non-proliferative diabetic retinopathy; PDR proliferative diabetic retinopathy; CSME clinically significant macular edema; DME diabetic macular edema; dbh dot blot hemorrhages; CWS cotton wool spot; POAG primary open angle glaucoma; C/D cup-to-disc ratio; HVF humphrey visual field; GVF goldmann visual field; OCT optical coherence tomography; IOP intraocular pressure; BRVO Branch retinal vein occlusion; CRVO central retinal vein occlusion; CRAO central retinal artery occlusion; BRAO branch retinal artery occlusion; RT retinal tear; SB scleral buckle; PPV pars plana vitrectomy; VH Vitreous  hemorrhage; PRP panretinal laser photocoagulation; IVK intravitreal kenalog; VMT vitreomacular traction; MH Macular hole;  NVD neovascularization of the disc; NVE neovascularization elsewhere; AREDS age related eye disease study; ARMD age related macular degeneration; POAG primary open angle glaucoma; EBMD epithelial/anterior basement membrane dystrophy; ACIOL anterior chamber intraocular lens; IOL intraocular lens; PCIOL posterior chamber intraocular lens; Phaco/IOL phacoemulsification with intraocular lens placement; [place] photorefractive keratectomy; LASIK laser assisted in situ keratomileusis; HTN hypertension; DM diabetes mellitus; COPD chronic obstructive pulmonary disease

## 2021-02-15 ENCOUNTER — Telehealth: Payer: Self-pay | Admitting: Lab

## 2021-02-15 NOTE — Chronic Care Management (AMB) (Signed)
  Chronic Care Management   Note  02/15/2021 Name: Justin [name] [facility]. MRN: [id number] DOB: 05-11-1941  Justin [name]. is a 80 y.o. year old male who is a primary care patient of Plotnikov, Evie Lacks, MD. I reached out to [employer]. by phone today in response to a referral sent by Mr. Justin Pagan Tschida Jr.'s PCP, Plotnikov, Evie Lacks, MD.   Justin [name] was given information about Chronic Care Management services today including:  CCM service includes personalized support from designated clinical staff supervised by his physician, including individualized plan of care and coordination with other care providers 24/7 contact phone numbers for assistance for urgent and routine care needs. Service will only be billed when office clinical staff spend 20 minutes or more in a month to coordinate care. Only one practitioner may furnish and bill the service in a calendar month. The patient may stop CCM services at any time (effective at the end of the month) by phone call to the office staff.   Patient agreed to services and verbal consent obtained.   Follow up plan:   Justin [name]

## 2021-03-03 ENCOUNTER — Encounter (INDEPENDENT_AMBULATORY_CARE_PROVIDER_SITE_OTHER): Payer: Self-pay

## 2021-03-03 DIAGNOSIS — H353221 Exudative age-related macular degeneration, left eye, with active choroidal neovascularization: Secondary | ICD-10-CM | POA: Diagnosis not present

## 2021-03-03 DIAGNOSIS — H402211 Chronic angle-closure glaucoma, right eye, mild stage: Secondary | ICD-10-CM | POA: Diagnosis not present

## 2021-03-03 DIAGNOSIS — H353113 Nonexudative age-related macular degeneration, right eye, advanced atrophic without subfoveal involvement: Secondary | ICD-10-CM | POA: Diagnosis not present

## 2021-03-03 DIAGNOSIS — H402222 Chronic angle-closure glaucoma, left eye, moderate stage: Secondary | ICD-10-CM | POA: Diagnosis not present

## 2021-03-03 DIAGNOSIS — H524 Presbyopia: Secondary | ICD-10-CM | POA: Diagnosis not present

## 2021-03-08 ENCOUNTER — Telehealth: Payer: Self-pay | Admitting: *Deleted

## 2021-03-08 DIAGNOSIS — M5416 Radiculopathy, lumbar region: Secondary | ICD-10-CM | POA: Diagnosis not present

## 2021-03-08 NOTE — Telephone Encounter (Signed)
Entered eye exam../lmb

## 2021-03-17 ENCOUNTER — Telehealth: Payer: Self-pay

## 2021-03-17 NOTE — Chronic Care Management (AMB) (Signed)
Chronic Care Management Pharmacy Assistant   Name: Justin [name] Justin [name].  MRN: [id number] DOB: [birth year]  Justin Pagan Vincen Penta. is an 80 y.o. year old male who presents for his initial CCM visit with the clinical pharmacist.  Recent office visits:  10/20/20 Justin Dawes MD PCP- Pt was seen for Vitamin. D Def. No labs or med changes. No follow up was noted.  Recent consult visits:  02/10/21 Justin Lair MD Ophthalmology- Pt was seen for age related macular degeneration. Labs were ordered and Aflibercept 2 mg was administered. Follow up in 3 months.  01/06/21 Justin Gentile MD Podiatry- Pt was seen for Dermatophytosis of nail. Pt had debridement and advised to follow up in 3 months.  12/22/20 Justin [name]- Patient seen for right knee pain. Unable to view documentation.  12/22/20 Justin [name] Ortho- Pt was seen for aftercare of joint replacement. Unable to view documentation.  12/06/20 Justin [name] Chiropractor- Pt was seen for long term drug therapy. Unable to view documentation.  12/02/20 Justin Horn MD Ophthalmology- Pt was seen for age related macular degeneration. Labs were ordered and Aflibercept 2 mg was administered at appt. Follow up in 10 weeks.  10/06/20 Justin Lair MD Ophthamology- Pt was seen for age related macular degeneration of left eye. Pt received IV inj. Of Aflibercept '2mg'$  and Bevacizumab 2.5 mg. Follow u in 2 months.  09/30/20 Justin [name] Physical Therapy- Unable to view documentation. 09/21/20 Justin [name] Dermatology unable to view documentation  [name] visits:  None in previous 6 months  Medications: Outpatient Encounter Medications as of 03/17/2021  Medication Sig   amoxicillin (AMOXIL) 500 MG capsule amoxicillin 500 mg capsule  4 po 1 hr prior to dental procedure   carbidopa-levodopa (SINEMET IR) 25-100 MG tablet For breakthrough restless leg, you can take 1 tablet at bedtime.  OK to take extra tab as needed during the day. (Patient taking  differently: Take 1 tablet by mouth at bedtime.)   Cholecalciferol 1000 UNITS tablet Take 3,000 Units by mouth daily.   Cyanocobalamin (VITAMIN B-12) 1000 MCG SUBL DISSOLVE 2 TABLETS IN MOUTH EVERY DAY (Patient taking differently: Take 2,000 mcg by mouth daily.)   docusate sodium (COLACE) 100 MG capsule Take 1 capsule (100 mg total) by mouth 2 (two) times daily.   famotidine (PEPCID) 20 MG tablet Take 20 mg by mouth as needed for heartburn or indigestion. [facility] brand pepcid   ferrous sulfate 325 (65 FE) MG tablet Take 650 mg by mouth daily with breakfast.   finasteride (PROSCAR) 5 MG tablet TAKE 1 TABLET BY MOUTH EVERY DAY (Patient taking differently: Take 5 mg by mouth in the morning.)   gabapentin (NEURONTIN) 300 MG capsule TAKE 1 CAPSULE (300 MG TOTAL) BY MOUTH 4 (FOUR) TIMES DAILY. (Patient taking differently: Take 300 mg by mouth 4 (four) times daily -  before meals and at bedtime.)   meclizine (ANTIVERT) 12.5 MG tablet TAKE 1 TABLET BY MOUTH 3 TIMES DAILY AS NEEDED FOR DIZZINESS.   methocarbamol (ROBAXIN) 500 MG tablet Take 1 tablet (500 mg total) by mouth every 6 (six) hours as needed for muscle spasms.   Multiple Vitamin (MULTIVITAMIN WITH MINERALS) TABS tablet Take 1 tablet by mouth daily.   Multiple Vitamins-Minerals (PRESERVISION AREDS PO) Take 2 tablets by mouth 2 (two) times daily.   Oxycodone HCl 10 MG TABS Take 10 mg by mouth 4 (four) times daily as needed (pain).   phenazopyridine (PYRIDIUM) 100 MG tablet Take 1 tablet (100 mg total) by  mouth 3 (three) times daily as needed for pain.   Polyethyl Glycol-Propyl Glycol (SYSTANE OP) Place 1 drop into both eyes 2 (two) times daily as needed (dry eyes).   polyethylene glycol (MIRALAX / GLYCOLAX) 17 g packet Take 17 g by mouth daily as needed for mild constipation.   pravastatin (PRAVACHOL) 20 MG tablet TAKE 1 TABLET BY MOUTH EVERY DAY   rOPINIRole (REQUIP) 2 MG tablet TAKE 1 TABLET BY MOUTH AT 12PM, 1 TABLET AT 4PM AND 1 TABLETS AT BEDTIME  (Patient taking differently: Take 1-2 mg by mouth See admin instructions. Take 1 mg by mouth with breakfast, lunch, and dinner and 2 mg at bedtime)   tamsulosin (FLOMAX) 0.4 MG CAPS capsule TAKE 1 CAPSULE BY MOUTH EVERY DAY (Patient taking differently: Take 0.4 mg by mouth in the morning.)   terazosin (HYTRIN) 2 MG capsule TAKE 1 CAPSULE BY MOUTH EVERYDAY AT BEDTIME   No facility-administered encounter medications on file as of 03/17/2021.    Current Medication List amoxicillin (AMOXIL) 500 MG capsule last filled 12/13/20 10 DS carbidopa-levodopa (SINEMET IR) 25-100 MG tablet last filled 09/23/20 90 DS Cholecalciferol 1000 UNITS tablet Cyanocobalamin (VITAMIN B-12) 1000 MCG SUBL docusate sodium (COLACE) 100 MG capsule famotidine (PEPCID) 20 MG tablet ferrous sulfate 325 (65 FE) MG tablet finasteride (PROSCAR) 5 MG tablet last filled 08/26/20 90 DS gabapentin (NEURONTIN) 300 MG capsule last filled 11/05/20 90 DS meclizine (ANTIVERT) 12.5 MG tablet last filled 03/10/21 20 DS methocarbamol (ROBAXIN) 500 MG tablet last filled 11/10/20 10 DS Multiple Vitamin (MULTIVITAMIN WITH MINERALS) TABS tablet Multiple Vitamins-Minerals (PRESERVISION AREDS PO) Oxycodone HCl 10 MG TABS last filled 03/10/21 10 DS phenazopyridine (PYRIDIUM) 100 MG tablet last filled 11/18/20 7 DS Polyethyl Glycol-Propyl Glycol (SYSTANE OP)  polyethylene glycol (MIRALAX / GLYCOLAX) 17 g packet pravastatin (PRAVACHOL) 20 MG tablet last filled 03/10/21 90 DS rOPINIRole (REQUIP) 2 MG tablet last filled 02/20/21 90 DS tamsulosin (FLOMAX) 0.4 MG CAPS capsule last filled 10/16/20 90 DS terazosin (HYTRIN) 2 MG capsule last filled   [place] Pharmacist Assistant [phone]

## 2021-03-22 ENCOUNTER — Other Ambulatory Visit: Payer: Self-pay

## 2021-03-22 ENCOUNTER — Ambulatory Visit (INDEPENDENT_AMBULATORY_CARE_PROVIDER_SITE_OTHER): Payer: PPO

## 2021-03-22 DIAGNOSIS — G8929 Other chronic pain: Secondary | ICD-10-CM

## 2021-03-22 DIAGNOSIS — M25561 Pain in right knee: Secondary | ICD-10-CM

## 2021-03-22 DIAGNOSIS — G629 Polyneuropathy, unspecified: Secondary | ICD-10-CM

## 2021-03-22 DIAGNOSIS — I2510 Atherosclerotic heart disease of native coronary artery without angina pectoris: Secondary | ICD-10-CM

## 2021-03-22 DIAGNOSIS — G2581 Restless legs syndrome: Secondary | ICD-10-CM

## 2021-03-22 DIAGNOSIS — M1990 Unspecified osteoarthritis, unspecified site: Secondary | ICD-10-CM

## 2021-03-22 DIAGNOSIS — I10 Essential (primary) hypertension: Secondary | ICD-10-CM

## 2021-03-22 DIAGNOSIS — N401 Enlarged prostate with lower urinary tract symptoms: Secondary | ICD-10-CM

## 2021-03-22 NOTE — Progress Notes (Addendum)
Chronic Care Management Pharmacy Note  03/22/2021 Name:  Kavonte Bearse [facility]. MRN:  [id number] DOB:  [birth year]  Summary: -Patient reports that he is doing well, continues to recover from recent total knee replacement (10/2020) - notes that he feels his pain is fairly well controlled throughout the day - continues with home PT exercises to build/maintain his strength -Feels that his RLS is well controlled, using ropinirole, carbidopa-levodopa, and gabapentin with success rarely has any issues  -BPH well controlled - due for updated PSA, denies issues with urination at this time -Following with Dr. [name] for macular degeneration of left eye - continuing with eylea injections -Cholesterol is well controlled, patient denies issues with pravastatin   Recommendations/Changes made from today's visit: -Recommending no changes to medications at this time, patient to monitor blood pressure at home at least once weekly and reach out should BP average >140/90 -Encouraged patient to continue with home exercises to stay active/ help keep BP and cholesterol well controlled -Recommending for updated Vitamin D, PSA, and Vitamin B12 levels to be taken with next PCP visit   Subjective: Aarib Pulido. is an 80 y.o. year old male who is a primary patient of Plotnikov, Evie Lacks, MD.  The CCM team was consulted for assistance with disease management and care coordination needs.    Engaged with patient by telephone for initial visit in response to provider referral for pharmacy case management and/or care coordination services.   Consent to Services:  The patient was given the following information about Chronic Care Management services today, agreed to services, and gave verbal consent: 1. CCM service includes personalized support from designated clinical staff supervised by the primary care provider, including individualized plan of care and coordination with other care providers 2. 24/7 contact phone  numbers for assistance for urgent and routine care needs. 3. Service will only be billed when office clinical staff spend 20 minutes or more in a month to coordinate care. 4. Only one practitioner may furnish and bill the service in a calendar month. 5.The patient may stop CCM services at any time (effective at the end of the month) by phone call to the office staff. 6. The patient will be responsible for cost sharing (co-pay) of up to 20% of the service fee (after annual deductible is met). Patient agreed to services and consent obtained.  Patient Care Team: Plotnikov, Evie Lacks, MD as PCP - General Carol Ada, MD as Consulting Physician (Gastroenterology) Martinique, Amy, MD as Consulting Physician (Dermatology) Alda Berthold, DO as Consulting Physician (Neurology) [name], MD as Consulting Physician (Physical Medicine and Rehabilitation) [name], MD as Consulting Physician (Orthopedic Surgery) Cortlan Dolin, Darnelle Maffucci, [facility] as Pharmacist (Pharmacist)  Medical screening examination/treatment/procedure(s) were performed by non-physician practitioner and as supervising physician I was immediately available for consultation/collaboration.  I agree with above. [name], MD   Recent office visits:  10/20/20 [name] MD PCP- Pt was seen for Vitamin. D Def. No labs or med changes. No follow up was noted.   Recent consult visits:  02/10/21 [name] MD Ophthalmology- Pt was seen for age related macular degeneration. Labs were ordered and Aflibercept 2 mg was administered. Follow up in 3 months.   01/06/21 [name] MD Podiatry- Pt was seen for Dermatophytosis of nail. Pt had debridement and advised to follow up in 3 months.   12/22/20 [name], [name]- Patient seen for right knee pain. Unable to view documentation.   12/22/20 [name]- Pt was  seen for aftercare of joint replacement. Unable to view documentation.   12/06/20 [name] Chiropractor- Pt was  seen for long term drug therapy. Unable to view documentation.   12/02/20 [name] Ophthalmology- Pt was seen for age related macular degeneration. Labs were ordered and Aflibercept 2 mg was administered at appt. Follow up in 10 weeks.   10/06/20 [name] MD Ophthamology- Pt was seen for age related macular degeneration of left eye. Pt received IV inj. Of Aflibercept $RemoveBefore'2mg'[id number]$  and Bevacizumab 2.5 mg. Follow u in 2 months.   09/30/20 [name] Physical Therapy- Unable to view documentation. 09/21/20 [name] Dermatology unable to view documentation   Hospital visits:  11/09/2020 - Right total knee arthroplasty   Objective:  Lab Results  Component Value Date   CREATININE 1.06 11/10/2020   BUN 21 11/10/2020   GFR 57.63 (L) 10/27/2019   GFRNONAA >60 11/10/2020   GFRAA 96 04/15/2008   NA 137 11/10/2020   K 4.2 11/10/2020   CALCIUM 8.8 (L) 11/10/2020   CO2 26 11/10/2020   GLUCOSE 183 (H) 11/10/2020    Lab Results  Component Value Date/Time   HGBA1C 5.6 07/28/2015 04:43 PM   HGBA1C  12/24/2007 05:00 AM    5.7 (NOTE)   The ADA recommends the following therapeutic goals for glycemic   control related to Hgb A1C measurement:   Goal of Therapy:   < 7.0% Hgb A1C   Action Suggested:  > 8.0% Hgb A1C   Ref:  Diabetes Care, 22, Suppl. 1, 1999   GFR 57.63 (L) 10/27/2019 11:55 AM   GFR 61.12 10/16/2019 03:28 PM    Last diabetic Eye exam:  No results found for: HMDIABEYEEXA  Last diabetic Foot exam:  No results found for: HMDIABFOOTEX   Lab Results  Component Value Date   CHOL 136 01/12/2017   HDL 54.70 01/12/2017   LDLCALC 68 01/12/2017   TRIG 67.0 01/12/2017   CHOLHDL 2 01/12/2017    Hepatic Function Latest Ref Rng & Units 10/28/2020 10/16/2019 07/31/2017  Total Protein 6.5 - 8.1 g/dL 6.9 6.5 6.7  Albumin 3.5 - 5.0 g/dL 4.0 4.1 3.8  AST 15 - 41 U/L $Remo'19 21 17  'RMWyF$ ALT 0 - 44 U/L $Remo'10 13 11  'dcxRt$ Alk Phosphatase 38 - 126 U/L 60 58 55  Total Bilirubin 0.3 - 1.2 mg/dL 0.8 0.4 0.5   Bilirubin, Direct 0.0 - 0.3 mg/dL - - 0.1    Lab Results  Component Value Date/Time   TSH 3.05 10/16/2019 03:28 PM   TSH 2.83 07/31/2017 10:23 AM   FREET4 0.92 10/16/2019 03:28 PM   FREET4 1.01 01/14/2015 10:06 AM    CBC Latest Ref Rng & Units 11/10/2020 10/28/2020 10/16/2019  WBC 4.0 - 10.5 K/uL 4.8 3.0(L) 5.1  Hemoglobin 13.0 - 17.0 g/dL 7.9(L) 9.0(L) 10.3(L)  Hematocrit 39.0 - 52.0 % 24.7(L) 27.4(L) 30.0(L)  Platelets 150 - 400 K/uL 112(L) 137(L) 160.0    Lab Results  Component Value Date/Time   VD25OH 50 02/25/2013 04:17 PM   VD25OH 49 10/21/2008 09:07 PM    Clinical ASCVD: No  The ASCVD Risk score (Arnett DK, et al., 2019) failed to calculate for the following reasons:   The 2019 ASCVD risk score is only valid for ages 27 to 70    Depression screen PHQ 2/9 06/14/2020 06/12/2019 05/14/2018  Decreased Interest 0 0 0  Down, Depressed, Hopeless 0 0 1  PHQ - 2 Score 0 0 1  Altered sleeping - 2  2  Tired, decreased energy - 0 1  Change in appetite - 0 0  Feeling bad or failure about yourself  - 0 0  Trouble concentrating - 0 0  Moving slowly or fidgety/restless - 0 0  Suicidal thoughts - 0 0  PHQ-9 Score - 2 4  Difficult doing work/chores - Not difficult at all Not difficult at all  Some recent data might be hidden    Social History   Tobacco Use  Smoking Status Former   Packs/day: 3.00   Years: 38.00   Pack years: 114.00   Types: Cigarettes   Quit date: 1986   Years since quitting: 36.7  Smokeless Tobacco Never  Tobacco Comments   states he quit May 15 1985   BP Readings from Last 3 Encounters:  11/10/20 133/66  10/28/20 (!) 134/58  10/20/20 140/62   Pulse Readings from Last 3 Encounters:  11/10/20 66  10/28/20 85  10/20/20 85   Wt Readings from Last 3 Encounters:  11/09/20 177 lb (80.3 kg)  10/28/20 177 lb (80.3 kg)  10/20/20 180 lb 6.4 oz (81.8 kg)   BMI Readings from Last 3 Encounters:  11/09/20 24.01 kg/m  10/28/20 24.01 kg/m  10/20/20  24.47 kg/m    Assessment/Interventions: Review of patient past medical history, allergies, medications, health status, including review of consultants reports, laboratory and other test data, was performed as part of comprehensive evaluation and provision of chronic care management services.   SDOH:  (Social Determinants of Health) assessments and interventions performed: Yes  SDOH Screenings   Alcohol Screen: Low Risk    Last Alcohol Screening Score (AUDIT): 0  Depression (PHQ2-9): Low Risk    PHQ-2 Score: 0  Financial Resource Strain: Low Risk    Difficulty of Paying Living Expenses: Not hard at all  Food Insecurity: No Food Insecurity   Worried About Charity fundraiser in the Last Year: Never true   Ran Out of Food in the Last Year: Never true  Housing: Low Risk    Last Housing Risk Score: 0  Physical Activity: Insufficiently Active   Days of Exercise per Week: 4 days   Minutes of Exercise per Session: 30 min  Social Connections: Engineer, building services of Communication with Friends and Family: More than three times a week   Frequency of Social Gatherings with Friends and Family: Once a week   Attends Religious Services: More than 4 times per year   Active Member of [employer] or Organizations: No   Attends Music therapist: More than 4 times per year   Marital Status: Married  Stress: No Stress Concern Present   Feeling of Stress : Not at all  Tobacco Use: Medium Risk   Smoking Tobacco Use: Former   Smokeless Tobacco Use: Never  Transportation Needs: No Data processing manager (Medical): No   Lack of Transportation (Non-Medical): No    CCM Care Plan  Allergies  Allergen Reactions   Levofloxacin     REACTION: hands pealed    Medications Reviewed Today     Reviewed by Tomasa Blase, [facility] (Pharmacist) on 03/22/21 at [place] List Status: <None>   Medication Order Taking? Sig Documenting Provider Last Dose Status Informant   amoxicillin (AMOXIL) 500 MG capsule [id number] No amoxicillin 500 mg capsule  4 po 1 hr prior to dental procedure  Patient not taking: Reported on 03/22/2021   [provider] Not Taking Active   carbidopa-levodopa (SINEMET  IR) 25-100 MG tablet [id number] Yes For breakthrough restless leg, you can take 1 tablet at bedtime.  OK to take extra tab as needed during the day.  Patient taking differently: Take 1 tablet by mouth at bedtime.   Nita Sickle K, DO Taking Active   Cholecalciferol 1000 UNITS tablet [id number] Yes Take 3,000 Units by mouth daily. [provider] Taking Active Self  Cyanocobalamin (VITAMIN B-12) 1000 MCG SUBL [id number] Yes DISSOLVE 2 TABLETS IN MOUTH EVERY DAY  Patient taking differently: Take 2,000 mcg by mouth daily.   Plotnikov, Georgina Quint, MD Taking Active   famotidine (PEPCID) 20 MG tablet [id number] Yes Take 20 mg by mouth daily as needed for heartburn or indigestion. [facility] brand pepcid [provider] Taking Active Self  ferrous sulfate 325 (65 FE) MG tablet [id number] Yes Take 650 mg by mouth daily with breakfast. [provider] Taking Active Self  finasteride (PROSCAR) 5 MG tablet [id number] Yes TAKE 1 TABLET BY MOUTH EVERY DAY  Patient taking differently: Take 5 mg by mouth in the morning.   Plotnikov, Georgina Quint, MD Taking Active   gabapentin (NEURONTIN) 300 MG capsule [id number] Yes TAKE 1 CAPSULE (300 MG TOTAL) BY MOUTH 4 (FOUR) TIMES DAILY.  Patient taking differently: Take 300 mg by mouth 3 (three) times daily.   Plotnikov, Georgina Quint, MD Taking Active   ibuprofen (ADVIL) 200 MG tablet [id number] Yes Take 400 mg by mouth daily as needed. [provider] Taking Active   meclizine (ANTIVERT) 12.5 MG tablet [id number] Yes TAKE 1 TABLET BY MOUTH 3 TIMES DAILY AS NEEDED FOR DIZZINESS. Plotnikov, Georgina Quint, MD Taking Active   Multiple Vitamins-Minerals (PRESERVISION AREDS PO) [id number] Yes Take 2 tablets by mouth 2 (two) times daily.  [provider] Taking Active Self  Oxycodone HCl 10 MG TABS [id number] Yes Take 10 mg by mouth 5 (five) times daily as needed (pain). Typically only uses 4 times daily [provider] Taking Active Self  Polyethyl Glycol-Propyl Glycol (SYSTANE OP) [id number] Yes Place 1 drop into both eyes 2 (two) times daily as needed (dry eyes). [provider] Taking Active Self  pravastatin (PRAVACHOL) 20 MG tablet [id number] Yes TAKE 1 TABLET BY MOUTH EVERY DAY Plotnikov, Georgina Quint, MD Taking Active   rOPINIRole (REQUIP) 2 MG tablet [id number] Yes TAKE 1 TABLET BY MOUTH AT 12PM, 1 TABLET AT 4PM AND 1 TABLETS AT BEDTIME  Patient taking differently: Take 2-4 mg by mouth See admin instructions. Take 2 mg by mouth with breakfast, lunch, and dinner and 4 mg at bedtime   Nita Sickle K, DO Taking Active   tamsulosin (FLOMAX) 0.4 MG CAPS capsule [id number] Yes TAKE 1 CAPSULE BY MOUTH EVERY DAY  Patient taking differently: Take 0.4 mg by mouth in the morning.   Plotnikov, Georgina Quint, MD Taking Active   terazosin (HYTRIN) 2 MG capsule [id number] Yes TAKE 1 CAPSULE BY MOUTH EVERYDAY AT BEDTIME Plotnikov, Georgina Quint, MD Taking Active             Patient Active Problem List   Diagnosis Date Noted   S/P total knee arthroplasty, right 11/09/2020   Preop exam for internal medicine 10/21/2020   Weight loss 08/30/2020   Elbow pain, chronic, left 05/31/2020   Knee pain, right 05/31/2020   Diastolic dysfunction 10/27/2019   Rash 10/16/2019   Exudative age-related macular degeneration of left eye with active choroidal neovascularization (HCC) 10/15/2019   Intermediate stage nonexudative age-related macular degeneration of left eye 10/15/2019  Early stage nonexudative age-related macular degeneration of right eye 10/15/2019   Pseudophakia 10/15/2019   Posterior vitreous detachment of left eye 10/15/2019   Cough 09/08/2019   Hamstring injury, left, initial encounter 12/20/2018   GERD  (gastroesophageal reflux disease) 11/13/2018   Chest pain 11/13/2018   Cellulitis of hand, left 11/09/2018   Coronary artery disease 10/24/2018   Claudication ([place]) 02/22/2018   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   Gallstones 11/23/2017   Anemia 08/28/2017   DOE (dyspnea on exertion) 07/31/2017   Fatigue 07/31/2017   HTN (hypertension) 11/29/2016   Acute bronchitis 04/12/2016   Smoking greater than 30 pack years 02/02/2016   Vertigo 02/09/2015   Left hand pain 01/14/2015   Elevated TSH 04/09/2014   Abdominal pain, lower 04/07/2014   Well adult exam 02/28/2013   Alcoholism in recovery ([place]) 02/28/2013   Ingrowing toenail of left foot 08/29/2012   Allergic rhinitis 10/10/2011   Dupuytren's contracture of hand 07/05/2011   Vitamin B12 deficiency 12/23/2010   BPH (benign prostatic hyperplasia) 12/23/2010   SINUSITIS, ACUTE 03/24/2010   CHEST PAIN 03/01/2010   Low back pain 07/27/2009   TOBACCO USE, QUIT 07/27/2009   DIVERTICULOSIS, COLON 03/28/2009   DIVERTICULITIS 03/26/2009   ABDOMINAL PAIN, LOWER 03/26/2009   RLS (restless legs syndrome) 10/28/2008   SKIN RASH, ALLERGIC 10/01/2008   ARM PAIN, LEFT 10/01/2008   Edema 10/01/2008   Neuropathy 04/22/2008   UTI 02/26/2008   RECTAL FISSURE 02/24/2008   FEVER UNSPECIFIED 02/24/2008   Vitamin D deficiency 01/21/2008   INSOMNIA, CHRONIC 12/23/2007   Depression 12/23/2007   Osteoarthritis 12/23/2007   SYNCOPE 12/23/2007   DIZZINESS 12/23/2007   CAROTID BRUIT, LEFT 12/23/2007   FRACTURE, ARM, LEFT 12/23/2007    Immunization History  Administered Date(s) Administered   Fluad Quad(high Dose 65+) 03/19/2019, 03/15/2020   Influenza Split 03/30/2011, 05/09/2012   Influenza Whole 04/22/2008, 03/31/2009   Influenza, High Dose Seasonal PF 05/07/2013, 03/23/2016, 03/27/2017, 04/03/2018   Influenza,inj,Quad PF,6+ Mos 04/07/2014, 04/29/2015   Influenza-Unspecified 04/02/2006   PFIZER(Purple Top)SARS-COV-2 Vaccination  08/07/2019, 09/01/2019, 04/27/2020   Pneumococcal Conjugate-13 06/13/2013   Pneumococcal Polysaccharide-23 10/27/2009   Pneumococcal-Unspecified 04/02/2004   Td 06/23/2010    Conditions to be addressed/monitored:  Hypertension, Hyperlipidemia, Coronary Artery Disease, Osteoarthritis, Neuropathy, BPH, GERD, and RLS  Care Plan : CCM Care Plan  Updates made by Tomasa Blase, RPH since 03/22/2021 12:00 AM     Problem: Hypertension, Hyperlipidemia, Coronary Artery Disease, Osteoarthritis, Neuropathy, BPH, GERD, and RLS   Priority: High  Onset Date: 03/22/2021     Long-Range Goal: Disease Management   Start Date: 03/22/2021  Expected End Date: 09/19/2021  This Visit's Progress: On track  Priority: High  Note:   Current Barriers:  Unable to independently monitor therapeutic efficacy  Pharmacist Clinical Goal(s):  Patient will verbalize ability to afford treatment regimen achieve adherence to monitoring guidelines and medication adherence to achieve therapeutic efficacy contact provider office for questions/concerns as evidenced notation of same in electronic health record through collaboration with PharmD and provider.   Interventions: 1:1 collaboration with Plotnikov, Evie Lacks, MD regarding development and update of comprehensive plan of care as evidenced by provider attestation and co-signature Inter-disciplinary care team collaboration (see longitudinal plan of care) Comprehensive medication review performed; medication list updated in electronic medical record  Hypertension (BP goal <140/90) -Controlled -Current treatment: Terazosin 2mg  - 1 capsule at bedtime  -Medications previously tried: furosemide   -Current home readings: n/a - patient reports that he has  not been monitoring at home - son previously would help him monitor BP Readings from Last 3 Encounters:  11/10/20 133/66  10/28/20 (!) 134/58  10/20/20 140/62  -Current dietary habits: reports to consuming a low  sodium diet -Current exercise habits: home PT (does 4 different exercises everyday to every other day) -Denies hypotensive/hypertensive symptoms -Educated on BP goals and benefits of medications for prevention of heart attack, stroke and kidney damage; Daily salt intake goal < 2300 mg; Exercise goal of 150 minutes per week; Importance of home blood pressure monitoring; Proper BP monitoring technique; -Counseled to monitor BP at home at least once weekly, document, and provide log at future appointments -Counseled on diet and exercise extensively Recommended to continue current medication  Hyperlipidemia/ Coronary Artery Disease : (LDL goal < 100) -Controlled Lab Results  Component Value Date   LDLCALC 68 01/12/2017  -Current treatment: Pravastatin 20mg  - 1 tablet daily  -Medications previously tried: aspirin -Current dietary patterns: moderate intake of red meats/ fried foods  -Current exercise habits: home PT (does 4 different exercises everyday to every other day) -Educated on Cholesterol goals;  Benefits of statin for ASCVD risk reduction; Importance of limiting foods high in cholesterol; Exercise goal of 150 minutes per week; -Counseled on diet and exercise extensively Recommended to continue current medication  Restless Leg Syndrome / Insomnia (Goal: Prevention of unwanted leg movements/ promotion of quality sleep) -Controlled -Current treatment  Ropinirole 2mg  - 1 tablet 3 times daily, and 2 tablets at bedtime  Gabapentin 300mg  - 1 capsule 3 times daily  Carbidopa-Levodopa 25-100mg  1 tablet at bedtime (for breakthrough RLS) may take additional tablet daily as needed -Medications previously tried: n/a  -Recommended to continue current medication  Chronic Pain - OA/ Neuropathy (Goal: Pain control) - Managed by Dr. Nelva Bush -Controlled -Current treatment  Oxycodone 10mg  - 1 tablet 4 times daily as needed Ibuprofen 200mg  - 2 tablets daily as needed   Gabapentin 300mg  - 1  capsule in AM, 1 capsule at     noon, and 2 capsules in PM -Medications previously tried: norco, voltaren gel -Recommended to continue current medication  GERD (Goal: Prevention/ control of acid reflux) -Controlled -Current treatment  Famotidine 20mg  - 1 tablet daily as needed  -Medications previously tried: n/a  -Counseled on diet and exercise extensively Recommended to continue current medication  BPH (Goal: prevention of disease progression / control of urinary symptoms) -Controlled Lab Results  Component Value Date   PSA 0.47 08/30/2016   PSA 0.47 07/28/2015   PSA 0.90 04/07/2014  -Current treatment  Tamsulosin 0.4mg  - 1 capsule daily  Finasteride 5mg  - 1 tablet daily  -Medications previously tried: n/a  -Recommended to continue current medication   Health Maintenance -Vaccine gaps: Shingles, Tdap, COVID booster, and influenza vaccine -Current therapy:  Vitamin D3 1000 units - 3 tablets daily  Vitamin B12 1028mcg - 2 tablet daily Preservision AREDS - 2 tablets twice daily  Systane Eye Drops - 1 drop into each eye daily as needed  Meclizine 12.5mg  - 1 tablet 3 times daily as needed  Ferrous Sulfate 325 (65 FE) mg - 2 tablets daily  Milk of Magnesium 400mg /57mL  - 15 mL daily as needed for constipation  -Educated on Cost vs benefit of each product must be carefully weighed by individual consumer -Patient is satisfied with current therapy and denies issues -Recommended to continue current medication  Patient Goals/Self-Care Activities Patient will:  - take medications as prescribed focus on medication adherence by using pill box  to keep medications organized and prevent missed doses of medications check blood pressure at least once weekly, document, and provide at future appointments target a minimum of 150 minutes of moderate intensity exercise weekly  Follow Up Plan: Telephone follow up appointment with care management team member scheduled for: 4 months The patient  has been provided with contact information for the care management team and has been advised to call with any health related questions or concerns.        Medication Assistance: None required.  Patient affirms current coverage meets needs.  Patient's preferred pharmacy is:  [id number] - [place], [place] [address] [place] [address] Phone: [phone] Fax: [phone]   Uses pill box? Yes Pt endorses 90-100% compliance  Care Plan and Follow Up Patient Decision:  Patient agrees to Care Plan and Follow-up.  Plan: Telephone follow up appointment with care management team member scheduled for:  4 months and The patient has been provided with contact information for the care management team and has been advised to call with any health related questions or concerns.   Medical screening examination/treatment/procedure(s) were performed by non-physician practitioner and as supervising physician I was immediately available for consultation/collaboration.  I agree with above. [name], MD

## 2021-03-22 NOTE — Patient Instructions (Signed)
Deitrich Steve [place].,  It was great to talk to you today!  Please call me with any questions or concerns.  Visit Information   PATIENT GOALS:   Goals Addressed             This Visit's Progress    Manage My Medicine       Timeframe:  Long-Range Goal Priority:  High Start Date:  03/22/2021                          Expected End Date: 09/19/2021                      Follow Up Date 07/22/2021   - call for medicine refill 2 or 3 days before it runs out - keep a list of all the medicines I take; vitamins and herbals too - learn to read medicine labels - use a pillbox to sort medicine    Why is this important?   These steps will help you keep on track with your medicines.     Track and Manage My Blood Pressure-Hypertension       Timeframe:  Long-Range Goal Priority:  High Start Date:  03/22/2021                           Expected End Date: 09/19/2021                      Follow Up Date 07/22/2021   - check blood pressure weekly - choose a place to take my blood pressure (home, clinic or office, retail store) - write blood pressure results in a log or diary    Why is this important?   You won't feel high blood pressure, but it can still hurt your blood vessels.  High blood pressure can cause heart or kidney problems. It can also cause a stroke.  Making lifestyle changes like losing a little weight or eating less salt will help.  Checking your blood pressure at home and at different times of the day can help to control blood pressure.  If the doctor prescribes medicine remember to take it the way the doctor ordered.  Call the office if you cannot afford the medicine or if there are questions about it.          Consent to CCM Services: Mr. Cawood was given information about Chronic Care Management services including:  CCM service includes personalized support from designated clinical staff supervised by his physician, including individualized plan of care and  coordination with other care providers 24/7 contact phone numbers for assistance for urgent and routine care needs. Service will only be billed when office clinical staff spend 20 minutes or more in a month to coordinate care. Only one practitioner may furnish and bill the service in a calendar month. The patient may stop CCM services at any time (effective at the end of the month) by phone call to the office staff. The patient will be responsible for cost sharing (co-pay) of up to 20% of the service fee (after annual deductible is met).  Patient agreed to services and verbal consent obtained.   Patient verbalizes understanding of instructions provided today and agrees to view in [place].   Telephone follow up appointment with care management team member scheduled for: 4 months The patient has been provided with contact information for the care management team and  has been advised to call with any health related questions or concerns.   Tomasa Blase, PharmD Clinical Pharmacist, [place]   CLINICAL CARE PLAN: Patient Care Plan: CCM Care Plan     Problem Identified: Hypertension, Hyperlipidemia, Coronary Artery Disease, Osteoarthritis, Neuropathy, BPH, GERD, and RLS   Priority: High  Onset Date: 03/22/2021     Long-Range Goal: Disease Management   Start Date: 03/22/2021  Expected End Date: 09/19/2021  This Visit's Progress: On track  Priority: High  Note:   Current Barriers:  Unable to independently monitor therapeutic efficacy  Pharmacist Clinical Goal(s):  Patient will verbalize ability to afford treatment regimen achieve adherence to monitoring guidelines and medication adherence to achieve therapeutic efficacy contact provider office for questions/concerns as evidenced notation of same in electronic health record through collaboration with PharmD and provider.   Interventions: 1:1 collaboration with Plotnikov, Evie Lacks, MD regarding development and update of  comprehensive plan of care as evidenced by provider attestation and co-signature Inter-disciplinary care team collaboration (see longitudinal plan of care) Comprehensive medication review performed; medication list updated in electronic medical record  Hypertension (BP goal <140/90) -Controlled -Current treatment: Terazosin 60m - 1 capsule at bedtime  -Medications previously tried: furosemide   -Current home readings: n/a - patient reports that he has not been monitoring at home - son previously would help him monitor BP Readings from Last 3 Encounters:  11/10/20 133/66  10/28/20 (!) 134/58  10/20/20 140/62  -Current dietary habits: reports to consuming a low sodium diet -Current exercise habits: home PT (does 4 different exercises everyday to every other day) -Denies hypotensive/hypertensive symptoms -Educated on BP goals and benefits of medications for prevention of heart attack, stroke and kidney damage; Daily salt intake goal < 2300 mg; Exercise goal of 150 minutes per week; Importance of home blood pressure monitoring; Proper BP monitoring technique; -Counseled to monitor BP at home at least once weekly, document, and provide log at future appointments -Counseled on diet and exercise extensively Recommended to continue current medication  Hyperlipidemia/ Coronary Artery Disease : (LDL goal < 100) -Controlled Lab Results  Component Value Date   LDLCALC 68 01/12/2017  -Current treatment: Pravastatin 275m- 1 tablet daily  -Medications previously tried: aspirin -Current dietary patterns: moderate intake of red meats/ fried foods  -Current exercise habits: home PT (does 4 different exercises everyday to every other day) -Educated on Cholesterol goals;  Benefits of statin for ASCVD risk reduction; Importance of limiting foods high in cholesterol; Exercise goal of 150 minutes per week; -Counseled on diet and exercise extensively Recommended to continue current  medication  Restless Leg Syndrome / Insomnia (Goal: Prevention of unwanted leg movements/ promotion of quality sleep) -Controlled -Current treatment  Ropinirole 60m25m 1 tablet 3 times daily, and 2 tablets at bedtime  Gabapentin 300m54m1 capsule 3 times daily  Carbidopa-Levodopa 25-100mg39mablet at bedtime (for breakthrough RLS) may take additional tablet daily as needed -Medications previously tried: n/a  -Recommended to continue current medication  Chronic Pain - OA/ Neuropathy (Goal: Pain control) - Managed by Dr. RamosNelva Bushtrolled -Current treatment  Oxycodone 10mg 760mtablet 4 times daily as needed Ibuprofen 200mg -43mablets daily as needed   Gabapentin 300mg - 13mpsule in AM, 1 capsule at     noon, and 2 capsules in PM -Medications previously tried: norco, voltaren gel -Recommended to continue current medication  GERD (Goal: Prevention/ control of acid reflux) -Controlled -Current treatment  Famotidine 20mg -4m  1 tablet daily as needed  -Medications previously tried: n/a  -Counseled on diet and exercise extensively Recommended to continue current medication  BPH (Goal: prevention of disease progression / control of urinary symptoms) -Controlled Lab Results  Component Value Date   PSA 0.47 08/30/2016   PSA 0.47 07/28/2015   PSA 0.90 04/07/2014  -Current treatment  Tamsulosin 0.23m - 1 capsule daily  Finasteride 543m- 1 tablet daily  -Medications previously tried: n/a  -Recommended to continue current medication   Health Maintenance -Vaccine gaps: Shingles, Tdap, COVID booster, and influenza vaccine -Current therapy:  Vitamin D3 1000 units - 3 tablets daily  Vitamin B12 100010m- 2 tablet daily Preservision AREDS - 2 tablets twice daily  Systane Eye Drops - 1 drop into each eye daily as needed  Meclizine 12.5mg65m1 tablet 3 times daily as needed  Ferrous Sulfate 325 (65 FE) mg - 2 tablets daily  Milk of Magnesium 400mg72m  - 15 mL daily as needed for  constipation  -Educated on Cost vs benefit of each product must be carefully weighed by individual consumer -Patient is satisfied with current therapy and denies issues -Recommended to continue current medication  Patient Goals/Self-Care Activities Patient will:  - take medications as prescribed focus on medication adherence by using pill box to keep medications organized and prevent missed doses of medications check blood pressure at least once weekly, document, and provide at future appointments target a minimum of 150 minutes of moderate intensity exercise weekly  Follow Up Plan: Telephone follow up appointment with care management team member scheduled for: 4 months The patient has been provided with contact information for the care management team and has been advised to call with any health related questions or concerns.

## 2021-03-24 DIAGNOSIS — M5416 Radiculopathy, lumbar region: Secondary | ICD-10-CM | POA: Insufficient documentation

## 2021-04-01 DIAGNOSIS — M1990 Unspecified osteoarthritis, unspecified site: Secondary | ICD-10-CM

## 2021-04-01 DIAGNOSIS — N401 Enlarged prostate with lower urinary tract symptoms: Secondary | ICD-10-CM

## 2021-04-01 DIAGNOSIS — I2510 Atherosclerotic heart disease of native coronary artery without angina pectoris: Secondary | ICD-10-CM | POA: Diagnosis not present

## 2021-04-01 DIAGNOSIS — I10 Essential (primary) hypertension: Secondary | ICD-10-CM

## 2021-04-12 DIAGNOSIS — M5416 Radiculopathy, lumbar region: Secondary | ICD-10-CM | POA: Diagnosis not present

## 2021-04-14 ENCOUNTER — Ambulatory Visit: Payer: PPO | Admitting: Podiatry

## 2021-04-14 ENCOUNTER — Other Ambulatory Visit: Payer: Self-pay

## 2021-04-14 DIAGNOSIS — G629 Polyneuropathy, unspecified: Secondary | ICD-10-CM

## 2021-04-14 DIAGNOSIS — M79675 Pain in left toe(s): Secondary | ICD-10-CM | POA: Diagnosis not present

## 2021-04-14 DIAGNOSIS — M79674 Pain in right toe(s): Secondary | ICD-10-CM

## 2021-04-14 DIAGNOSIS — B351 Tinea unguium: Secondary | ICD-10-CM

## 2021-04-14 NOTE — Progress Notes (Signed)
Subjective: 80 y.o. returns the office today for painful, elongated, thickened toenails which he cannot trim himself and also a painful corn second toe on the left foot.  Denies any systemic complaints such as fevers, chills, nausea, vomiting.   Recently had a knee replacement which is doing well.  PCP: [name], MD Last seen October 20, 2020  Objective: AAO 3, NAD DP/PT pulses palpable, CRT less than 3 seconds Neurological status is unchanged.  Nails hypertrophic, dystrophic, elongated, brittle, discolored 10. There is tenderness overlying the nails 1-5 bilaterally. There is no surrounding erythema or drainage along the nail sites. The hyperkeratotic tissue the distal aspect of the left second toe.  No ongoing ulceration drainage or signs of infection.   There is deformity present the left third toe and the toe is rigid contracture at the level of the DIPJ.   Hammertoes present.  No pain with calf compression, swelling, warmth, erythema.  Assessment: Patient presents with symptomatic onychomycosis; hyperkeratotic lesion; left third toe deformity  Plan: -Treatment options including alternatives, risks, complications were discussed -Nails sharply debrided 10 without complication/bleeding. -Hyperkeratotic lesion sharply debrided x 1 without any complications or bleeding. Continue offloading to the toes. -Discussed daily foot inspection. If there are any changes, to call the office immediately.  -Follow-up in 9 weeks or sooner if any problems are to arise. In the meantime, encouraged to call the office with any questions, concerns, changes symptoms.  [name], DPM

## 2021-04-19 ENCOUNTER — Other Ambulatory Visit: Payer: Self-pay

## 2021-04-19 ENCOUNTER — Ambulatory Visit (INDEPENDENT_AMBULATORY_CARE_PROVIDER_SITE_OTHER): Payer: PPO | Admitting: Internal Medicine

## 2021-04-19 ENCOUNTER — Encounter: Payer: Self-pay | Admitting: Internal Medicine

## 2021-04-19 VITALS — BP 146/80 | HR 72 | Temp 98.1°F | Ht 72.0 in | Wt 174.0 lb

## 2021-04-19 DIAGNOSIS — I2510 Atherosclerotic heart disease of native coronary artery without angina pectoris: Secondary | ICD-10-CM | POA: Diagnosis not present

## 2021-04-19 DIAGNOSIS — G629 Polyneuropathy, unspecified: Secondary | ICD-10-CM | POA: Diagnosis not present

## 2021-04-19 DIAGNOSIS — G2581 Restless legs syndrome: Secondary | ICD-10-CM

## 2021-04-19 DIAGNOSIS — R739 Hyperglycemia, unspecified: Secondary | ICD-10-CM | POA: Diagnosis not present

## 2021-04-19 DIAGNOSIS — E538 Deficiency of other specified B group vitamins: Secondary | ICD-10-CM

## 2021-04-19 DIAGNOSIS — Z23 Encounter for immunization: Secondary | ICD-10-CM | POA: Diagnosis not present

## 2021-04-19 DIAGNOSIS — I10 Essential (primary) hypertension: Secondary | ICD-10-CM | POA: Diagnosis not present

## 2021-04-19 LAB — COMPREHENSIVE METABOLIC PANEL
ALT: 5 U/L (ref 0–53)
AST: 15 U/L (ref 0–37)
Albumin: 4.3 g/dL (ref 3.5–5.2)
Alkaline Phosphatase: 55 U/L (ref 39–117)
BUN: 15 mg/dL (ref 6–23)
CO2: 26 mEq/L (ref 19–32)
Calcium: 9.3 mg/dL (ref 8.4–10.5)
Chloride: 104 mEq/L (ref 96–112)
Creatinine, Ser: 1.05 mg/dL (ref 0.40–1.50)
GFR: 66.37 mL/min (ref >60.00)
Glucose, Bld: 89 mg/dL (ref 70–99)
Potassium: 4 mEq/L (ref 3.5–5.1)
Sodium: 139 mEq/L (ref 135–145)
Total Bilirubin: 0.7 mg/dL (ref 0.2–1.2)
Total Protein: 7.1 g/dL (ref 6.0–8.3)

## 2021-04-19 LAB — HEMOGLOBIN A1C: Hgb A1c MFr Bld: 5.3 % (ref 4.6–6.5)

## 2021-04-19 NOTE — Assessment & Plan Note (Signed)
On B12 

## 2021-04-19 NOTE — Progress Notes (Signed)
Subjective:  Patient ID: Justin Pagan Su [place]., male    DOB: 13-Apr-1941  Age: 80 y.o. MRN: [id number]  CC: Follow-up (6 month f/u- Flu shot)   HPI Justin Govan [employer]. presents for insomnia, RLS, LBP, HTN  Outpatient Medications Prior to Visit  Medication Sig Dispense Refill   Acetaminophen (TYLENOL PO) Tylenol     carbidopa-levodopa (SINEMET IR) 25-100 MG tablet For breakthrough restless leg, you can take 1 tablet at bedtime.  OK to take extra tab as needed during the day. (Patient taking differently: Take 1 tablet by mouth at bedtime.) 100 tablet 3   Cholecalciferol 1000 UNITS tablet Take 3,000 Units by mouth daily.     Cyanocobalamin (VITAMIN B-12) 1000 MCG SUBL DISSOLVE 2 TABLETS IN MOUTH EVERY DAY (Patient taking differently: Take 2,000 mcg by mouth daily.) 60 tablet 8   famotidine (PEPCID) 20 MG tablet Take 20 mg by mouth daily as needed for heartburn or indigestion. [facility] brand pepcid     ferrous sulfate 325 (65 FE) MG tablet Take 650 mg by mouth daily with breakfast.     finasteride (PROSCAR) 5 MG tablet TAKE 1 TABLET BY MOUTH EVERY DAY (Patient taking differently: Take 5 mg by mouth in the morning.) 90 tablet 3   gabapentin (NEURONTIN) 300 MG capsule TAKE 1 CAPSULE (300 MG TOTAL) BY MOUTH 4 (FOUR) TIMES DAILY. (Patient taking differently: Take 300 mg by mouth 3 (three) times daily.) 360 capsule 1   ibuprofen (ADVIL) 200 MG tablet Take 400 mg by mouth daily as needed.     magnesium hydroxide (MILK OF MAGNESIA) 400 MG/5ML suspension Take 15 mLs by mouth daily as needed for mild constipation.     meclizine (ANTIVERT) 12.5 MG tablet TAKE 1 TABLET BY MOUTH 3 TIMES DAILY AS NEEDED FOR DIZZINESS. 60 tablet 1   Multiple Vitamins-Minerals (PRESERVISION AREDS PO) Take 2 tablets by mouth 2 (two) times daily.     Oxycodone HCl 10 MG TABS Take 10 mg by mouth 5 (five) times daily as needed (pain). Typically only uses 4 times daily     Polyethyl Glycol-Propyl Glycol (SYSTANE OP) Place 1 drop  into both eyes 2 (two) times daily as needed (dry eyes).     pravastatin (PRAVACHOL) 20 MG tablet TAKE 1 TABLET BY MOUTH EVERY DAY 90 tablet 1   rOPINIRole (REQUIP) 2 MG tablet TAKE 1 TABLET BY MOUTH AT 12PM, 1 TABLET AT 4PM AND 1 TABLETS AT BEDTIME (Patient taking differently: Take 2-4 mg by mouth See admin instructions. Take 2 mg by mouth with breakfast, lunch, and dinner and 4 mg at bedtime) 360 tablet 1   tamsulosin (FLOMAX) 0.4 MG CAPS capsule TAKE 1 CAPSULE BY MOUTH EVERY DAY (Patient taking differently: Take 0.4 mg by mouth in the morning.) 90 capsule 3   terazosin (HYTRIN) 2 MG capsule TAKE 1 CAPSULE BY MOUTH EVERYDAY AT BEDTIME 90 capsule 3   amoxicillin (AMOXIL) 500 MG capsule amoxicillin 500 mg capsule  4 po 1 hr prior to dental procedure (Patient not taking: No sig reported)     No facility-administered medications prior to visit.    ROS: Review of Systems  Constitutional:  Negative for appetite change, fatigue and unexpected weight change.  HENT:  Negative for congestion, nosebleeds, sneezing, sore throat and trouble swallowing.   Eyes:  Negative for itching and visual disturbance.  Respiratory:  Negative for cough.   Cardiovascular:  Negative for chest pain, palpitations and leg swelling.  Gastrointestinal:  Negative for abdominal distention,  blood in stool, diarrhea and nausea.  Genitourinary:  Negative for frequency and hematuria.  Musculoskeletal:  Positive for arthralgias, back pain and gait problem. Negative for joint swelling and neck pain.  Skin:  Negative for rash.  Neurological:  Negative for dizziness, tremors, speech difficulty and weakness.  Psychiatric/Behavioral:  Negative for agitation, dysphoric mood, sleep disturbance and suicidal ideas. The patient is not nervous/anxious.    Objective:  BP (!) 146/80 (BP Location: Left Arm)   Pulse 72   Temp 98.1 F (36.7 C) (Oral)   Ht 6' (1.829 m)   Wt 174 lb (78.9 kg)   SpO2 97%   BMI 23.60 kg/m   BP Readings  from Last 3 Encounters:  04/19/21 (!) 146/80  11/10/20 133/66  10/28/20 (!) 134/58    Wt Readings from Last 3 Encounters:  04/19/21 174 lb (78.9 kg)  11/09/20 177 lb (80.3 kg)  10/28/20 177 lb (80.3 kg)    Physical Exam Constitutional:      General: He is not in acute distress.    Appearance: He is well-developed.     Comments: NAD  Eyes:     Conjunctiva/sclera: Conjunctivae normal.     Pupils: Pupils are equal, round, and reactive to light.  Neck:     Thyroid: No thyromegaly.     Vascular: No JVD.  Cardiovascular:     Rate and Rhythm: Normal rate and regular rhythm.     Heart sounds: Normal heart sounds. No murmur heard.   No friction rub. No gallop.  Pulmonary:     Effort: Pulmonary effort is normal. No respiratory distress.     Breath sounds: Normal breath sounds. No wheezing or rales.  Chest:     Chest wall: No tenderness.  Abdominal:     General: Bowel sounds are normal. There is no distension.     Palpations: Abdomen is soft. There is no mass.     Tenderness: There is no abdominal tenderness. There is no guarding or rebound.  Musculoskeletal:        General: Tenderness present. Normal range of motion.     Cervical back: Normal range of motion.  Lymphadenopathy:     Cervical: No cervical adenopathy.  Skin:    General: Skin is warm and dry.     Findings: No rash.  Neurological:     Mental Status: He is alert and oriented to person, place, and time.     Cranial Nerves: No cranial nerve deficit.     Motor: No abnormal muscle tone.     Coordination: Coordination abnormal.     Gait: Gait abnormal.     Deep Tendon Reflexes: Reflexes are normal and symmetric.  Psychiatric:        Behavior: Behavior normal.        Thought Content: Thought content normal.        Judgment: Judgment normal.   LS w/pain Hands w/deformities Tip of breast bone - prominent   Lab Results  Component Value Date   WBC 4.8 11/10/2020   HGB 7.9 (L) 11/10/2020   HCT 24.7 (L) 11/10/2020    PLT 112 (L) 11/10/2020   GLUCOSE 89 04/19/2021   CHOL 136 01/12/2017   TRIG 67.0 01/12/2017   HDL 54.70 01/12/2017   LDLCALC 68 01/12/2017   ALT 5 04/19/2021   AST 15 04/19/2021   NA 139 04/19/2021   K 4.0 04/19/2021   CL 104 04/19/2021   CREATININE 1.05 04/19/2021   BUN 15 04/19/2021   CO2  26 04/19/2021   TSH 3.05 10/16/2019   PSA 0.47 08/30/2016   INR 1.1 10/28/2020   HGBA1C 5.3 04/19/2021    No results found.  Assessment & Plan:   Problem List Items Addressed This Visit     Coronary artery disease    CAD according to cardiac calcium score of 41.  However, more plaque is noted in the main artery (mid-LAD).   Continue on Pravastatin, baby aspirin       HTN (hypertension) - Primary    Stable.  On Hytrin      Relevant Orders   Hemoglobin A1c (Completed)   Comprehensive metabolic panel (Completed)   Neuropathy    Not better.  Will try gabapentin 300 mg breakfast, 300 mg lunch, 600 mg bedtime      RLS (restless legs syndrome)    Chronic RLS.  It continues to be bothersome. Cont w/Requip, gabapentin, Sinemet      Vitamin B12 deficiency    On B12      Other Visit Diagnoses     Needs flu shot       Relevant Orders   Flu Vaccine QUAD High Dose(Fluad) (Completed)   Hyperglycemia       Relevant Orders   Hemoglobin A1c (Completed)   Comprehensive metabolic panel (Completed)         Follow-up: Return in about 6 months (around 10/18/2021) for Wellness Exam.  [name], MD

## 2021-04-19 NOTE — Assessment & Plan Note (Signed)
CAD according to cardiac calcium score of 41.  However, more plaque is noted in the main artery (mid-LAD).   Continue on Pravastatin, baby aspirin

## 2021-04-19 NOTE — Assessment & Plan Note (Addendum)
Chronic RLS.  It continues to be bothersome. Cont w/Requip, gabapentin, Sinemet

## 2021-04-19 NOTE — Assessment & Plan Note (Addendum)
Not better.  Will try gabapentin 300 mg breakfast, 300 mg lunch, 600 mg bedtime

## 2021-04-19 NOTE — Assessment & Plan Note (Addendum)
Stable.  On Hytrin

## 2021-04-21 ENCOUNTER — Ambulatory Visit: Payer: PPO | Admitting: Internal Medicine

## 2021-04-25 ENCOUNTER — Encounter: Payer: Self-pay | Admitting: Internal Medicine

## 2021-04-25 ENCOUNTER — Telehealth (INDEPENDENT_AMBULATORY_CARE_PROVIDER_SITE_OTHER): Payer: PPO | Admitting: Internal Medicine

## 2021-04-25 ENCOUNTER — Other Ambulatory Visit: Payer: Self-pay

## 2021-04-25 DIAGNOSIS — U071 COVID-19: Secondary | ICD-10-CM | POA: Diagnosis not present

## 2021-04-25 MED ORDER — MOLNUPIRAVIR 200 MG PO CAPS: 4.0000 | ORAL_CAPSULE | Freq: Two times a day (BID) | ORAL | 0 refills | Status: AC

## 2021-04-25 NOTE — Progress Notes (Signed)
Virtual Visit via telephone Note  I connected with [place]. on 04/25/21 at  1:40 PM EDT by telephone and verified that I am speaking with the correct person using two identifiers.   I discussed the limitations of evaluation and management by telemedicine and the availability of in person appointments. The patient expressed understanding and agreed to proceed.  Present for the visit:  Myself, Dr Billey Gosling, Virl Cagey.  The patient is currently at home and I am in the office.    No referring provider.    History of Present Illness: This is an acute visit for covid  His symptoms started yesterday with sore throat and developed a fever of 101.  He states fatigue, decreased appetite, mild nasal congestion, sore throat, cough that is productive of phlegm, occasional wheezing and body aches.  He denies any change in his smell or taste, shortness of breath, headaches or dizziness.  He tested positive for COVID yesterday.  He has had both vaccines and 1 booster.  He has not taking any medications for his current symptoms.     Review of Systems  Constitutional:  Positive for fever (101) and malaise/fatigue.       Dec appetite  HENT:  Positive for congestion (mild) and sore throat. Negative for ear pain.        No change in taste or smell  Respiratory:  Positive for cough, sputum production and wheezing (occ). Negative for shortness of breath.   Gastrointestinal:  Negative for diarrhea and nausea.  Musculoskeletal:  Positive for myalgias.  Neurological:  Negative for dizziness and headaches.       Social History   Socioeconomic History   Marital status: Married    Spouse name: Not on file   Number of children: 2   Years of education: Not on file   Highest education level: Not on file  Occupational History   Occupation: [place] Veteran - [employer]    Employer: RETIRED   Occupation: Chiropodist   Occupation: Heritage manager  Tobacco Use   Smoking  status: Former    Packs/day: 3.00    Years: 38.00    Pack years: 114.00    Types: Cigarettes    Quit date: 1986    Years since quitting: 36.8   Smokeless tobacco: Never   Tobacco comments:    states he quit May 15 1985  Vaping Use   Vaping Use: Never used  Substance and Sexual Activity   Alcohol use: No    Alcohol/week: 0.0 standard drinks    Comment: PREVIOUS - DRY 17yrs   Drug use: Not Currently   Sexual activity: Not Currently  Other Topics Concern   Not on file  Social History Narrative   Right handed   One story home   Drinks coffee q am   Social Determinants of Health   Financial Resource Strain: Low Risk    Difficulty of Paying Living Expenses: Not hard at all  Food Insecurity: No Food Insecurity   Worried About Charity fundraiser in the Last Year: Never true   [place] in the Last Year: Never true  Transportation Needs: No Transportation Needs   Lack of Transportation (Medical): No   Lack of Transportation (Non-Medical): No  Physical Activity: Insufficiently Active   Days of Exercise per Week: 4 days   Minutes of Exercise per Session: 30 min  Stress: No Stress Concern Present   Feeling of Stress : Not at all  Social Connections: Engineer, building services of Communication with Friends and Family: More than three times a week   Frequency of Social Gatherings with Friends and Family: Once a week   Attends Religious Services: More than 4 times per year   Active Member of [employer] or Organizations: No   Attends Music therapist: More than 4 times per year   Marital Status: Married     Assessment and Plan:  See Problem List for Assessment and Plan of chronic medical problems.   Follow Up Instructions:    I discussed the assessment and treatment plan with the patient. The patient was provided an opportunity to ask questions and all were answered. The patient agreed with the plan and demonstrated an understanding of the  instructions.   The patient was advised to call back or seek an in-person evaluation if the symptoms worsen or if the condition fails to improve as anticipated.  Time spent on telephone call: 15 minutes  Binnie Rail, MD

## 2021-04-25 NOTE — Assessment & Plan Note (Signed)
Acute Symptoms started yesterday and he tested positive yesterday Symptoms mild-moderate in nature and he has high risk for complications and we will start antiviral Start more molnupiravir 800 mg twice daily x5 days given the medication interactions with Paxloid Discussed over-the-counter cold medications he can take for symptom relief, Tylenol, ibuprofen Rest and fluids advised Advised to call if he develops any shortness of breath or other concerning symptoms or if he has any questions in general

## 2021-05-05 ENCOUNTER — Other Ambulatory Visit: Payer: Self-pay

## 2021-05-05 ENCOUNTER — Ambulatory Visit (INDEPENDENT_AMBULATORY_CARE_PROVIDER_SITE_OTHER): Payer: PPO | Admitting: Ophthalmology

## 2021-05-05 ENCOUNTER — Encounter (INDEPENDENT_AMBULATORY_CARE_PROVIDER_SITE_OTHER): Payer: Self-pay | Admitting: Ophthalmology

## 2021-05-05 DIAGNOSIS — H353221 Exudative age-related macular degeneration, left eye, with active choroidal neovascularization: Secondary | ICD-10-CM | POA: Diagnosis not present

## 2021-05-05 DIAGNOSIS — H353111 Nonexudative age-related macular degeneration, right eye, early dry stage: Secondary | ICD-10-CM | POA: Diagnosis not present

## 2021-05-05 MED ORDER — AFLIBERCEPT 2MG/0.05ML IZ SOLN FOR KALEIDOSCOPE
2.0000 mg | INTRAVITREAL | Status: AC | PRN
  Administered 2021-05-05: 2 mg via INTRAVITREAL

## 2021-05-05 NOTE — Assessment & Plan Note (Signed)
No sign of CNVM OD 

## 2021-05-05 NOTE — Assessment & Plan Note (Signed)
Today at 12 weeks post most recent evaluation and injection of antivegF.  Coincidentally now 2 weeks post onset of COVID-related illness with out hospitalization

## 2021-05-05 NOTE — Progress Notes (Signed)
05/05/2021     CHIEF COMPLAINT Patient presents for  Chief Complaint  Patient presents with   Retina Follow Up      HISTORY OF PRESENT ILLNESS: Justin [name]. is a 80 y.o. male who presents to the clinic today for:   HPI     Retina Follow Up   Patient presents with  Wet AMD.  In left eye.  This started 12 weeks ago.  Severity is mild.  Duration of 12 weeks.  Since onset it is stable.        Comments   12 week fu OU oct Eylea OS. Patient states vision is stable and unchanged since last visit. Denies any new floaters or FOL.       Last edited by Laurin Coder on 05/05/2021  8:20 AM.      Referring physician: [name], MD [place],  [place] [id number]  HISTORICAL INFORMATION:   Selected notes from the MEDICAL RECORD NUMBER    Lab Results  Component Value Date   HGBA1C 5.3 04/19/2021     CURRENT MEDICATIONS: Current Outpatient Medications (Ophthalmic Drugs)  Medication Sig   Polyethyl Glycol-Propyl Glycol (SYSTANE OP) Place 1 drop into both eyes 2 (two) times daily as needed (dry eyes).   No current facility-administered medications for this visit. (Ophthalmic Drugs)   Current Outpatient Medications (Other)  Medication Sig   Acetaminophen (TYLENOL PO) Tylenol   amoxicillin (AMOXIL) 500 MG capsule amoxicillin 500 mg capsule  4 po 1 hr prior to dental procedure (Patient not taking: No sig reported)   carbidopa-levodopa (SINEMET IR) 25-100 MG tablet For breakthrough restless leg, you can take 1 tablet at bedtime.  OK to take extra tab as needed during the day. (Patient taking differently: Take 1 tablet by mouth at bedtime.)   Cholecalciferol 1000 UNITS tablet Take 3,000 Units by mouth daily.   Cyanocobalamin (VITAMIN B-12) 1000 MCG SUBL DISSOLVE 2 TABLETS IN MOUTH EVERY DAY (Patient taking differently: Take 2,000 mcg by mouth daily.)   famotidine (PEPCID) 20 MG tablet Take 20 mg by mouth daily as needed for heartburn or  indigestion. [facility] brand pepcid   ferrous sulfate 325 (65 FE) MG tablet Take 650 mg by mouth daily with breakfast.   finasteride (PROSCAR) 5 MG tablet TAKE 1 TABLET BY MOUTH EVERY DAY (Patient taking differently: Take 5 mg by mouth in the morning.)   gabapentin (NEURONTIN) 300 MG capsule TAKE 1 CAPSULE (300 MG TOTAL) BY MOUTH 4 (FOUR) TIMES DAILY. (Patient taking differently: Take 300 mg by mouth 3 (three) times daily.)   ibuprofen (ADVIL) 200 MG tablet Take 400 mg by mouth daily as needed.   magnesium hydroxide (MILK OF MAGNESIA) 400 MG/5ML suspension Take 15 mLs by mouth daily as needed for mild constipation.   meclizine (ANTIVERT) 12.5 MG tablet TAKE 1 TABLET BY MOUTH 3 TIMES DAILY AS NEEDED FOR DIZZINESS.   Multiple Vitamins-Minerals (PRESERVISION AREDS PO) Take 2 tablets by mouth 2 (two) times daily.   Oxycodone HCl 10 MG TABS Take 10 mg by mouth 5 (five) times daily as needed (pain). Typically only uses 4 times daily   pravastatin (PRAVACHOL) 20 MG tablet TAKE 1 TABLET BY MOUTH EVERY DAY   rOPINIRole (REQUIP) 2 MG tablet TAKE 1 TABLET BY MOUTH AT 12PM, 1 TABLET AT 4PM AND 1 TABLETS AT BEDTIME (Patient taking differently: Take 2-4 mg by mouth See admin instructions. Take 2 mg by mouth with breakfast, lunch, and dinner  and 4 mg at bedtime)   tamsulosin (FLOMAX) 0.4 MG CAPS capsule TAKE 1 CAPSULE BY MOUTH EVERY DAY (Patient taking differently: Take 0.4 mg by mouth in the morning.)   terazosin (HYTRIN) 2 MG capsule TAKE 1 CAPSULE BY MOUTH EVERYDAY AT BEDTIME   No current facility-administered medications for this visit. (Other)      REVIEW OF SYSTEMS:    ALLERGIES Allergies  Allergen Reactions   Levofloxacin     REACTION: hands pealed    PAST MEDICAL HISTORY Past Medical History:  Diagnosis Date   Alcoholism ([place])    Dry 13 years   Basal cell carcinoma    Right Chest   BPH (benign prostatic hyperplasia)    Diverticulosis of colon    Dizzy spells    none recent   DJD  (degenerative joint disease)    lower back   Elbow fracture, left 2009   [name]   GERD (gastroesophageal reflux disease)    Glaucoma    History of TIAs 1 yrs ago   Hyperkeratosis    LBP (low back pain)    Macular degeneration    left wet right dry sees [name]   Melanoma [facility]) 2009   x3 [name]   Osteoarthritis    Restless leg syndrome    Stroke [facility]) 2005   TIA   Tinnitus    Tobacco abuse    Uses walker    Wears glasses    reading   Wears partial dentures    upper   Past Surgical History:  Procedure Laterality Date   CATARACT EXTRACTION Bilateral 2018   HARDWARE REMOVAL Left 07/09/2020   Procedure: Left elbow hardware removal;  Surgeon: [name], MD;  Location: [facility];  Service: Orthopedics;  Laterality: Left;  60 mins   mda     Dr. [name] wet injection   MELANOMA EXCISION  2009   x3   RECONSTRUCTION MEDIAL COLLATERAL LIGAMENT ELBOW W/ TENDON GRAFT  2009   Left, [name]   TONSILLECTOMY  as child   TOTAL KNEE ARTHROPLASTY Right 11/09/2020   Procedure: TOTAL KNEE ARTHROPLASTY;  Surgeon: [name], MD;  Location: [facility];  Service: Orthopedics;  Laterality: Right;    FAMILY HISTORY Family History  Problem Relation Age of Onset   Cancer Mother        ?breast   Cancer Father        Lung    SOCIAL HISTORY Social History   Tobacco Use   Smoking status: Former    Packs/day: 3.00    Years: 38.00    Pack years: 114.00    Types: Cigarettes    Quit date: 1986    Years since quitting: 36.8   Smokeless tobacco: Never   Tobacco comments:    states he quit May 15 1985  Vaping Use   Vaping Use: Never used  Substance Use Topics   Alcohol use: No    Alcohol/week: 0.0 standard drinks    Comment: PREVIOUS - DRY 56yrs   Drug use: Not Currently         OPHTHALMIC EXAM:  Base Eye Exam     Visual Acuity (ETDRS)       Right Left   Dist [place] 20/20 -2 20/400   Dist ph [place]  20/200 +1         Tonometry (Tonopen,  8:21 AM)       Right Left   Pressure 11 10  Pupils       Pupils Dark Light APD   Right PERRL 3 2 None   Left PERRL 3 2 None         Extraocular Movement       Right Left    Full Full         Neuro/Psych     Oriented x3: Yes   Mood/Affect: Normal         Dilation     Both eyes: 1.0% Mydriacyl, 2.5% Phenylephrine @ 8:21 AM           Slit Lamp and Fundus Exam     External Exam       Right Left   External Normal Normal         Slit Lamp Exam       Right Left   Lids/Lashes Normal Normal   Conjunctiva/Sclera White and quiet White and quiet   Cornea Clear Clear   Anterior Chamber Deep and quiet Deep and quiet   Iris Round and reactive Round and reactive   Lens Posterior chamber intraocular lens Posterior chamber intraocular lens   Anterior Vitreous Normal Normal         Fundus Exam       Right Left   Posterior Vitreous Posterior vitreous detachment Posterior vitreous detachment   Disc Normal Normal   C/D Ratio 0.5 0.65   Macula Hard drusen, no exudates, no membrane, Retinal pigment epithelial mottling Retinal pigment epithelial detachment, Retinal pigment epithelial mottling, Drusen, Hard drusen, Atrophy,  intermediate age related macular degeneration, Geographic atrophy, with small hemorrhage nasal to faz   Vessels  Normal   Periphery  Normal            IMAGING AND PROCEDURES  Imaging and Procedures for 05/05/21  Intravitreal Injection, Pharmacologic Agent - OS - Left Eye       Time Out 05/05/2021. 8:49 AM. Confirmed correct patient, procedure, site, and patient consented.   Anesthesia Topical anesthesia was used. Anesthetic medications included Akten 3.5%.   Procedure Preparation included Tobramycin 0.3%, 5% betadine to ocular surface, 10% betadine to eyelids. A 30 gauge needle was used.   Injection: 2 mg aflibercept 2 MG/0.05ML   Route: Intravitreal, Site: Left Eye   NDC: [id number], Lot: [id number], Waste: 0 mL    Post-op Post injection exam found visual acuity of at least counting fingers. The patient tolerated the procedure well. There were no complications. The patient received written and verbal post procedure care education. Post injection medications included gentamicin.      OCT, Retina - OU - Both Eyes       Right Eye Quality was good. Scan locations included subfoveal. Central Foveal Thickness: 286. Progression has been stable. Findings include normal foveal contour, retinal drusen , no IRF, no SRF.   Left Eye Quality was good. Scan locations included subfoveal. Central Foveal Thickness: 221. Progression has improved. Findings include no IRF, abnormal foveal contour, pigment epithelial detachment, subretinal fluid.   Notes OS, much improved now at 12-week follow-up.,  And now also 2 weeks post COVID-related illness onset               ASSESSMENT/PLAN:  Exudative age-related macular degeneration of left eye with active choroidal neovascularization ([place]) Today at 12 weeks post most recent evaluation and injection of antivegF.  Coincidentally now 2 weeks post onset of COVID-related illness with out hospitalization  Early stage nonexudative age-related macular degeneration of right eye No sign of CNVM OD  ICD-10-CM   1. Exudative age-related macular degeneration of left eye with active choroidal neovascularization (H[facility])  H35.3221 Intravitreal Injection, Pharmacologic Agent - OS - Left Eye    OCT, Retina - OU - Both Eyes    aflibercept (EYLEA) SOLN 2 mg    2. Early stage nonexudative age-related macular degeneration of right eye  H35.3111       1.  2.  3.  Ophthalmic Meds Ordered this visit:  Meds ordered this encounter  Medications   aflibercept (EYLEA) SOLN 2 mg       Return in about 10 weeks (around 07/14/2021) for dilate, OS, EYLEA OCT.  There are no Patient Instructions on file for this visit.   Explained the diagnoses, plan, and follow up with the  patient and they expressed understanding.  Patient expressed understanding of the importance of proper follow up care.   Clent Demark Alexxander Kurt M.D. Diseases & Surgery of the Retina and Vitreous [facility] [place] 05/05/21     Abbreviations: M myopia (nearsighted); A astigmatism; H hyperopia (farsighted); P presbyopia; Mrx spectacle prescription;  CTL contact lenses; OD right eye; OS left eye; OU both eyes  XT exotropia; ET esotropia; PEK punctate epithelial keratitis; PEE punctate epithelial erosions; DES dry eye syndrome; MGD meibomian gland dysfunction; ATs artificial tears; PFAT's preservative free artificial tears; [place] nuclear sclerotic cataract; PSC posterior subcapsular cataract; ERM epi-retinal membrane; PVD posterior vitreous detachment; RD retinal detachment; DM diabetes mellitus; DR diabetic retinopathy; NPDR non-proliferative diabetic retinopathy; PDR proliferative diabetic retinopathy; CSME clinically significant macular edema; DME diabetic macular edema; dbh dot blot hemorrhages; CWS cotton wool spot; POAG primary open angle glaucoma; C/D cup-to-disc ratio; HVF humphrey visual field; GVF goldmann visual field; OCT optical coherence tomography; IOP intraocular pressure; BRVO Branch retinal vein occlusion; CRVO central retinal vein occlusion; CRAO central retinal artery occlusion; BRAO branch retinal artery occlusion; RT retinal tear; SB scleral buckle; PPV pars plana vitrectomy; VH Vitreous hemorrhage; PRP panretinal laser photocoagulation; IVK intravitreal kenalog; VMT vitreomacular traction; MH Macular hole;  NVD neovascularization of the disc; NVE neovascularization elsewhere; AREDS age related eye disease study; ARMD age related macular degeneration; POAG primary open angle glaucoma; EBMD epithelial/anterior basement membrane dystrophy; ACIOL anterior chamber intraocular lens; IOL intraocular lens; PCIOL posterior chamber intraocular lens; Phaco/IOL phacoemulsification with intraocular  lens placement; [place] photorefractive keratectomy; LASIK laser assisted in situ keratomileusis; HTN hypertension; DM diabetes mellitus; COPD chronic obstructive pulmonary disease

## 2021-05-10 ENCOUNTER — Telehealth: Payer: Self-pay | Admitting: Internal Medicine

## 2021-05-10 DIAGNOSIS — M5416 Radiculopathy, lumbar region: Secondary | ICD-10-CM | POA: Diagnosis not present

## 2021-05-10 DIAGNOSIS — E538 Deficiency of other specified B group vitamins: Secondary | ICD-10-CM

## 2021-05-10 MED ORDER — MECLIZINE HCL 12.5 MG PO TABS: ORAL_TABLET | ORAL | 2 refills | Status: DC

## 2021-05-10 MED ORDER — VITAMIN B-12 1000 MCG SL SUBL
SUBLINGUAL_TABLET | SUBLINGUAL | 1 refills | Status: AC
Start: 2021-05-10 — End: ?

## 2021-05-10 MED ORDER — TERAZOSIN HCL 2 MG PO CAPS: ORAL_CAPSULE | ORAL | 1 refills | Status: DC

## 2021-05-10 MED ORDER — GABAPENTIN 300 MG PO CAPS: 300.0000 mg | ORAL_CAPSULE | Freq: Four times a day (QID) | ORAL | 1 refills | Status: DC

## 2021-05-10 MED ORDER — TAMSULOSIN HCL 0.4 MG PO CAPS: 0.4000 mg | ORAL_CAPSULE | Freq: Every morning | ORAL | 1 refills | Status: DC

## 2021-05-10 MED ORDER — FINASTERIDE 5 MG PO TABS: 5.0000 mg | ORAL_TABLET | Freq: Every day | ORAL | 1 refills | Status: DC

## 2021-05-10 MED ORDER — PRAVASTATIN SODIUM 20 MG PO TABS: 20.0000 mg | ORAL_TABLET | Freq: Every day | ORAL | 1 refills | Status: DC

## 2021-05-10 NOTE — Telephone Encounter (Signed)
Pt is up to date on appt. Sent refills to [facility].Marland KitchenJohny [name]

## 2021-05-10 NOTE — Telephone Encounter (Signed)
[facility] requesting new rx for:  Cyanocobalamin (VITAMIN B-12) 1000 MCG SUBL finasteride (PROSCAR) 5 MG tablet gabapentin (NEURONTIN) 300 MG capsule meclizine (ANTIVERT) 12.5 MG tablet pravastatin (PRAVACHOL) 20 MG tablet tamsulosin (FLOMAX) 0.4 MG CAPS capsule terazosin (HYTRIN) 2 MG capsule

## 2021-05-11 DIAGNOSIS — L570 Actinic keratosis: Secondary | ICD-10-CM | POA: Diagnosis not present

## 2021-05-11 DIAGNOSIS — D2271 Melanocytic nevi of right lower limb, including hip: Secondary | ICD-10-CM | POA: Diagnosis not present

## 2021-05-11 DIAGNOSIS — Z85820 Personal history of malignant melanoma of skin: Secondary | ICD-10-CM | POA: Diagnosis not present

## 2021-05-11 DIAGNOSIS — Z85828 Personal history of other malignant neoplasm of skin: Secondary | ICD-10-CM | POA: Diagnosis not present

## 2021-05-11 DIAGNOSIS — D1801 Hemangioma of skin and subcutaneous tissue: Secondary | ICD-10-CM | POA: Diagnosis not present

## 2021-05-11 DIAGNOSIS — D2371 Other benign neoplasm of skin of right lower limb, including hip: Secondary | ICD-10-CM | POA: Diagnosis not present

## 2021-05-11 DIAGNOSIS — D225 Melanocytic nevi of trunk: Secondary | ICD-10-CM | POA: Diagnosis not present

## 2021-05-19 ENCOUNTER — Other Ambulatory Visit: Payer: Self-pay | Admitting: Neurology

## 2021-06-10 ENCOUNTER — Other Ambulatory Visit: Payer: PPO

## 2021-07-08 ENCOUNTER — Other Ambulatory Visit: Payer: Self-pay

## 2021-07-08 ENCOUNTER — Encounter: Payer: Self-pay | Admitting: Internal Medicine

## 2021-07-08 ENCOUNTER — Ambulatory Visit (INDEPENDENT_AMBULATORY_CARE_PROVIDER_SITE_OTHER): Payer: PPO | Admitting: Internal Medicine

## 2021-07-08 DIAGNOSIS — M79672 Pain in left foot: Secondary | ICD-10-CM | POA: Diagnosis not present

## 2021-07-08 DIAGNOSIS — J069 Acute upper respiratory infection, unspecified: Secondary | ICD-10-CM | POA: Insufficient documentation

## 2021-07-08 DIAGNOSIS — M79605 Pain in left leg: Secondary | ICD-10-CM | POA: Insufficient documentation

## 2021-07-08 MED ORDER — PREDNISONE 20 MG PO TABS: 40.0000 mg | ORAL_TABLET | Freq: Every day | ORAL | 0 refills | Status: DC

## 2021-07-08 NOTE — Assessment & Plan Note (Signed)
Lungs clear on exam. Likely viral and resolving. Is being treated with prednisone for possible pseudogout and this may help reduce coughing as well. Advised to start claritin or zyrtec to help with drainage.

## 2021-07-08 NOTE — Patient Instructions (Signed)
We have sent in prednisone to take 2 pills daily for 5 days.  It is okay to start claritin daily to help with drainage so your cough improves quicker.

## 2021-07-08 NOTE — Progress Notes (Signed)
° °  Subjective:   Patient ID: Justin Pagan Su Grand., male    DOB: June 14, 1941, 81 y.o.   MRN: [id number]  HPI The patient is an 81 YO man coming in for viral URI symptoms also possible gout. Congestion started about 6-7 days ago. No wheezing, some cough  Review of Systems  Constitutional:  Positive for activity change. Negative for appetite change, chills, fatigue, fever and unexpected weight change.  HENT:  Positive for congestion and postnasal drip. Negative for ear discharge, ear pain, rhinorrhea, sinus pressure, sinus pain, sneezing, sore throat, tinnitus, trouble swallowing and voice change.   Eyes: Negative.   Respiratory:  Positive for cough. Negative for chest tightness, shortness of breath and wheezing.   Cardiovascular: Negative.   Gastrointestinal: Negative.   Musculoskeletal:  Positive for arthralgias and myalgias.  Neurological: Negative.    Objective:  Physical Exam Constitutional:      Appearance: He is well-developed.  HENT:     Head: Normocephalic and atraumatic.     Comments: Oropharynx with redness and clear drainage, nose with swollen turbinates, TMs normal bilaterally.  Neck:     Thyroid: No thyromegaly.  Cardiovascular:     Rate and Rhythm: Normal rate and regular rhythm.  Pulmonary:     Effort: Pulmonary effort is normal. No respiratory distress.     Breath sounds: Normal breath sounds. No wheezing or rales.  Abdominal:     General: Bowel sounds are normal. There is no distension.     Palpations: Abdomen is soft.     Tenderness: There is no abdominal tenderness. There is no rebound.  Musculoskeletal:        General: Tenderness present.     Cervical back: Normal range of motion.     Comments: Left 2nd toe with redness, nail deformity and significant callusing, no abscess detected non-tender to palpation  Lymphadenopathy:     Cervical: No cervical adenopathy.  Skin:    General: Skin is warm and dry.  Neurological:     Mental Status: He is alert and  oriented to person, place, and time.     Coordination: Coordination normal.    Vitals:   07/08/21 0926  BP: 130/62  Pulse: 70  Resp: 18  SpO2: 98%  Weight: 168 lb 6.4 oz (76.4 kg)  Height: 6' (1.829 m)    This visit occurred during the SARS-CoV-2 public health emergency.  Safety protocols were in place, including screening questions prior to the visit, additional usage of staff PPE, and extensive cleaning of exam room while observing appropriate contact time as indicated for disinfecting solutions.   Assessment & Plan:

## 2021-07-08 NOTE — Assessment & Plan Note (Signed)
2nd toe with redness. No pain to palpation today. There is significant callusing and nail deformity but no abscess or infection detected. This started 2 days ago and is better today with no pain to palpation. Seems less likely gout but could be consistent with pseudogout. No alcohol use and no change in diet recently. Rx prednisone 40 mg daily for 5 days. At next follow up recommend uric acid measurement to check.

## 2021-07-13 ENCOUNTER — Encounter (INDEPENDENT_AMBULATORY_CARE_PROVIDER_SITE_OTHER): Payer: Self-pay | Admitting: Ophthalmology

## 2021-07-13 ENCOUNTER — Other Ambulatory Visit: Payer: Self-pay

## 2021-07-13 ENCOUNTER — Ambulatory Visit (INDEPENDENT_AMBULATORY_CARE_PROVIDER_SITE_OTHER): Payer: PPO | Admitting: Ophthalmology

## 2021-07-13 DIAGNOSIS — H353221 Exudative age-related macular degeneration, left eye, with active choroidal neovascularization: Secondary | ICD-10-CM

## 2021-07-13 DIAGNOSIS — H353111 Nonexudative age-related macular degeneration, right eye, early dry stage: Secondary | ICD-10-CM | POA: Diagnosis not present

## 2021-07-13 MED ORDER — AFLIBERCEPT 2MG/0.05ML IZ SOLN FOR KALEIDOSCOPE
2.0000 mg | INTRAVITREAL | Status: AC | PRN
  Administered 2021-07-13: 2 mg via INTRAVITREAL

## 2021-07-13 NOTE — Progress Notes (Signed)
07/13/2021     CHIEF COMPLAINT Patient presents for  Chief Complaint  Patient presents with   Retina Follow Up      HISTORY OF PRESENT ILLNESS: Justin [name]. is a 81 y.o. male who presents to the clinic today for:   HPI     Retina Follow Up           Diagnosis: Wet AMD   Laterality: left eye   Onset: 10 weeks ago   Severity: mild   Duration: 10 weeks   Course: stable         Comments   10 week fu OS and OCT and Eylea OCT- Exudative ARMD OS- Last injection was 05/05/2021  Pt states VA OU stable since last visit. Pt denies FOL, floaters, or ocular pain OU.         Last edited by Kendra Opitz, COA on 07/13/2021  8:12 AM.      Referring physician: [name], MD [place],  [place] [address]  HISTORICAL INFORMATION:   Selected notes from the MEDICAL RECORD NUMBER    Lab Results  Component Value Date   HGBA1C 5.3 04/19/2021     CURRENT MEDICATIONS: Current Outpatient Medications (Ophthalmic Drugs)  Medication Sig   Polyethyl Glycol-Propyl Glycol (SYSTANE OP) Place 1 drop into both eyes 2 (two) times daily as needed (dry eyes).   No current facility-administered medications for this visit. (Ophthalmic Drugs)   Current Outpatient Medications (Other)  Medication Sig   Acetaminophen (TYLENOL PO) Tylenol   amoxicillin (AMOXIL) 500 MG capsule amoxicillin 500 mg capsule  4 po 1 hr prior to dental procedure   carbidopa-levodopa (SINEMET IR) 25-100 MG tablet For breakthrough restless leg, you can take 1 tablet at bedtime.  OK to take extra tab as needed during the day. (Patient taking differently: Take 1 tablet by mouth at bedtime.)   Cholecalciferol 1000 UNITS tablet Take 3,000 Units by mouth daily.   Cyanocobalamin (VITAMIN B-12) 1000 MCG SUBL DISSOLVE 2 TABLETS IN MOUTH EVERY DAY   famotidine (PEPCID) 20 MG tablet Take 20 mg by mouth daily as needed for heartburn or indigestion. [facility] brand pepcid   ferrous sulfate 325 (65  FE) MG tablet Take 650 mg by mouth daily with breakfast.   finasteride (PROSCAR) 5 MG tablet Take 1 tablet (5 mg total) by mouth daily.   gabapentin (NEURONTIN) 300 MG capsule Take 1 capsule (300 mg total) by mouth 4 (four) times daily.   ibuprofen (ADVIL) 200 MG tablet Take 400 mg by mouth daily as needed.   magnesium hydroxide (MILK OF MAGNESIA) 400 MG/5ML suspension Take 15 mLs by mouth daily as needed for mild constipation.   meclizine (ANTIVERT) 12.5 MG tablet TAKE 1 TABLET BY MOUTH 3 TIMES DAILY AS NEEDED FOR DIZZINESS.   Multiple Vitamins-Minerals (PRESERVISION AREDS PO) Take 2 tablets by mouth 2 (two) times daily.   Oxycodone HCl 10 MG TABS Take 10 mg by mouth 5 (five) times daily as needed (pain). Typically only uses 4 times daily   pravastatin (PRAVACHOL) 20 MG tablet Take 1 tablet (20 mg total) by mouth daily.   predniSONE (DELTASONE) 20 MG tablet Take 2 tablets (40 mg total) by mouth daily with breakfast.   rOPINIRole (REQUIP) 2 MG tablet TAKE 1 TABLET BY MOUTH AT 12PM, 1 TABLET AT 4PM AND 1 TABLETS AT BEDTIME (Patient taking differently: Take 2-4 mg by mouth See admin instructions. Take 2 mg by mouth with breakfast, lunch,  and dinner and 4 mg at bedtime)   tamsulosin (FLOMAX) 0.4 MG CAPS capsule Take 1 capsule (0.4 mg total) by mouth in the morning.   terazosin (HYTRIN) 2 MG capsule TAKE 1 CAPSULE BY MOUTH EVERYDAY AT BEDTIME   No current facility-administered medications for this visit. (Other)      REVIEW OF SYSTEMS: ROS   Negative for: Constitutional, Gastrointestinal, Neurological, Skin, Genitourinary, Musculoskeletal, HENT, Endocrine, Cardiovascular, Eyes, Respiratory, Psychiatric, Allergic/Imm, Heme/Lymph Last edited by Hurman Horn, MD on 07/13/2021  8:48 AM.       ALLERGIES Allergies  Allergen Reactions   Levofloxacin     REACTION: hands pealed    PAST MEDICAL HISTORY Past Medical History:  Diagnosis Date   Alcoholism ([place])    Dry 13 years   Basal cell  carcinoma    Right Chest   BPH (benign prostatic hyperplasia)    Diverticulosis of colon    Dizzy spells    none recent   DJD (degenerative joint disease)    lower back   Elbow fracture, left 2009   [name]   GERD (gastroesophageal reflux disease)    Glaucoma    History of TIAs 1 yrs ago   Hyperkeratosis    LBP (low back pain)    Macular degeneration    left wet right dry sees [name]   Melanoma [facility]) 2009   x3 [name]   Osteoarthritis    Restless leg syndrome    Stroke [facility]) 2005   TIA   Tinnitus    Tobacco abuse    Uses walker    Wears glasses    reading   Wears partial dentures    upper   Past Surgical History:  Procedure Laterality Date   CATARACT EXTRACTION Bilateral 2018   HARDWARE REMOVAL Left 07/09/2020   Procedure: Left elbow hardware removal;  Surgeon: [name], MD;  Location: [facility];  Service: Orthopedics;  Laterality: Left;  60 mins   mda     Dr. [name] wet injection   MELANOMA EXCISION  2009   x3   RECONSTRUCTION MEDIAL COLLATERAL LIGAMENT ELBOW W/ TENDON GRAFT  2009   Left, [name]   TONSILLECTOMY  as child   TOTAL KNEE ARTHROPLASTY Right 11/09/2020   Procedure: TOTAL KNEE ARTHROPLASTY;  Surgeon: [name], MD;  Location: [facility];  Service: Orthopedics;  Laterality: Right;    FAMILY HISTORY Family History  Problem Relation Age of Onset   Cancer Mother        ?breast   Cancer Father        Lung    SOCIAL HISTORY Social History   Tobacco Use   Smoking status: Former    Packs/day: 3.00    Years: 38.00    Pack years: 114.00    Types: Cigarettes    Quit date: 1986    Years since quitting: 37.0   Smokeless tobacco: Never   Tobacco comments:    states he quit May 15 1985  Vaping Use   Vaping Use: Never used  Substance Use Topics   Alcohol use: No    Alcohol/week: 0.0 standard drinks    Comment: PREVIOUS - DRY 70yrs   Drug use: Not Currently         OPHTHALMIC EXAM:  Base Eye Exam      Visual Acuity (ETDRS)       Right Left   Dist [place] 20/25 +1 20/400   Dist ph [place]  20/300  Tonometry (Tonopen, 8:15 AM)       Right Left   Pressure 13 15         Pupils       Pupils Dark Light Shape React APD   Right PERRL 3 3 Round Minimal None   Left PERRL 3 3 Round Minimal None         Visual Fields (Counting fingers)       Left Right    Full Full         Extraocular Movement       Right Left    Full, Ortho Full, Ortho         Neuro/Psych     Oriented x3: Yes   Mood/Affect: Normal         Dilation     Left eye: 1.0% Mydriacyl, 2.5% Phenylephrine @ 8:15 AM           Slit Lamp and Fundus Exam     External Exam       Right Left   External Normal Normal         Slit Lamp Exam       Right Left   Lids/Lashes Normal Normal   Conjunctiva/Sclera White and quiet White and quiet   Cornea Clear Clear   Anterior Chamber Deep and quiet Deep and quiet   Iris Round and reactive Round and reactive   Lens Posterior chamber intraocular lens Posterior chamber intraocular lens   Anterior Vitreous Normal Normal         Fundus Exam       Right Left   Posterior Vitreous  Posterior vitreous detachment   Disc  Normal   C/D Ratio  0.65   Macula  Retinal pigment epithelial detachment, Retinal pigment epithelial mottling, Drusen, Hard drusen, Atrophy in the p.m. bundle region, intermediate age related macular degeneration, Geographic atrophy   Vessels  Normal   Periphery  Normal            IMAGING AND PROCEDURES  Imaging and Procedures for 07/13/21  OCT, Retina - OU - Both Eyes       Right Eye Quality was good. Scan locations included subfoveal. Central Foveal Thickness: 287. Progression has been stable. Findings include normal foveal contour, retinal drusen , no IRF, no SRF.   Left Eye Quality was good. Scan locations included subfoveal. Central Foveal Thickness: 225. Progression has improved. Findings include no IRF,  abnormal foveal contour, pigment epithelial detachment.   Notes OS, much improved now at 110-week follow-up.,  Much less subretinal fluid on this visit today.  Vision limited by subretinal hyper reflective material and vascularized fibrotic PED       Intravitreal Injection, Pharmacologic Agent - OS - Left Eye       Time Out 07/13/2021. 8:49 AM. Confirmed correct patient, procedure, site, and patient consented.   Anesthesia Topical anesthesia was used. Anesthetic medications included Akten 3.5%.   Procedure Preparation included Tobramycin 0.3%, 5% betadine to ocular surface, 10% betadine to eyelids. A 30 gauge needle was used.   Injection: 2 mg aflibercept 2 MG/0.05ML   Route: Intravitreal, Site: Left Eye   NDC: [id number], Lot: [id number], Waste: 0 mL   Post-op Post injection exam found visual acuity of at least counting fingers. The patient tolerated the procedure well. There were no complications. The patient received written and verbal post procedure care education. Post injection medications included gentamicin.              ASSESSMENT/PLAN:  Early stage nonexudative age-related macular degeneration of right eye No sign of CNVM by exam or by OCT  Exudative age-related macular degeneration of left eye with active choroidal neovascularization ([place]) Today at 10-week follow-up much less subretinal fluid.  Vision limited by subretinal hyper reflective material representing fibrotic or vascularized PED      ICD-10-CM   1. Exudative age-related macular degeneration of left eye with active choroidal neovascularization (HCC)  H35.3221 OCT, Retina - OU - Both Eyes    Intravitreal Injection, Pharmacologic Agent - OS - Left Eye    aflibercept (EYLEA) SOLN 2 mg    2. Early stage nonexudative age-related macular degeneration of right eye  H35.3111       1.  OS vastly improved macular anatomy by OCT.  Repeat injection today at 10 weeks and maintain 10-week follow-up.  2.   Dilate OU next  3.  Ophthalmic Meds Ordered this visit:  Meds ordered this encounter  Medications   aflibercept (EYLEA) SOLN 2 mg       Return in about 10 weeks (around 09/21/2021) for DILATE OU, EYLEA OCT, OS.  There are no Patient Instructions on file for this visit.   Explained the diagnoses, plan, and follow up with the patient and they expressed understanding.  Patient expressed understanding of the importance of proper follow up care.   Clent Demark Florencia Zaccaro M.D. Diseases & Surgery of the Retina and Vitreous Retina & Diabetic [place] 07/13/21     Abbreviations: M myopia (nearsighted); A astigmatism; H hyperopia (farsighted); P presbyopia; Mrx spectacle prescription;  CTL contact lenses; OD right eye; OS left eye; OU both eyes  XT exotropia; ET esotropia; PEK punctate epithelial keratitis; PEE punctate epithelial erosions; DES dry eye syndrome; MGD meibomian gland dysfunction; ATs artificial tears; PFAT's preservative free artificial tears; [place] nuclear sclerotic cataract; PSC posterior subcapsular cataract; ERM epi-retinal membrane; PVD posterior vitreous detachment; RD retinal detachment; DM diabetes mellitus; DR diabetic retinopathy; NPDR non-proliferative diabetic retinopathy; PDR proliferative diabetic retinopathy; CSME clinically significant macular edema; DME diabetic macular edema; dbh dot blot hemorrhages; CWS cotton wool spot; POAG primary open angle glaucoma; C/D cup-to-disc ratio; HVF humphrey visual field; GVF goldmann visual field; OCT optical coherence tomography; IOP intraocular pressure; BRVO Branch retinal vein occlusion; CRVO central retinal vein occlusion; CRAO central retinal artery occlusion; BRAO branch retinal artery occlusion; RT retinal tear; SB scleral buckle; PPV pars plana vitrectomy; VH Vitreous hemorrhage; PRP panretinal laser photocoagulation; IVK intravitreal kenalog; VMT vitreomacular traction; MH Macular hole;  NVD neovascularization of the disc; NVE  neovascularization elsewhere; AREDS age related eye disease study; ARMD age related macular degeneration; POAG primary open angle glaucoma; EBMD epithelial/anterior basement membrane dystrophy; ACIOL anterior chamber intraocular lens; IOL intraocular lens; PCIOL posterior chamber intraocular lens; Phaco/IOL phacoemulsification with intraocular lens placement; [place] photorefractive keratectomy; LASIK laser assisted in situ keratomileusis; HTN hypertension; DM diabetes mellitus; COPD chronic obstructive pulmonary disease

## 2021-07-13 NOTE — Assessment & Plan Note (Signed)
Today at 10-week follow-up much less subretinal fluid.  Vision limited by subretinal hyper reflective material representing fibrotic or vascularized PED

## 2021-07-13 NOTE — Assessment & Plan Note (Signed)
No sign of CNVM by exam or by OCT

## 2021-07-14 ENCOUNTER — Encounter (INDEPENDENT_AMBULATORY_CARE_PROVIDER_SITE_OTHER): Payer: PPO | Admitting: Ophthalmology

## 2021-07-18 ENCOUNTER — Other Ambulatory Visit: Payer: Self-pay

## 2021-07-18 ENCOUNTER — Ambulatory Visit: Payer: PPO | Admitting: Podiatry

## 2021-07-18 DIAGNOSIS — B351 Tinea unguium: Secondary | ICD-10-CM

## 2021-07-18 DIAGNOSIS — L03032 Cellulitis of left toe: Secondary | ICD-10-CM

## 2021-07-18 DIAGNOSIS — L97521 Non-pressure chronic ulcer of other part of left foot limited to breakdown of skin: Secondary | ICD-10-CM

## 2021-07-18 DIAGNOSIS — M79675 Pain in left toe(s): Secondary | ICD-10-CM | POA: Diagnosis not present

## 2021-07-18 DIAGNOSIS — M79674 Pain in right toe(s): Secondary | ICD-10-CM

## 2021-07-18 DIAGNOSIS — L02612 Cutaneous abscess of left foot: Secondary | ICD-10-CM

## 2021-07-18 MED ORDER — CEPHALEXIN 500 MG PO CAPS: 500.0000 mg | ORAL_CAPSULE | Freq: Three times a day (TID) | ORAL | 0 refills | Status: DC

## 2021-07-19 ENCOUNTER — Telehealth: Payer: PPO

## 2021-07-19 DIAGNOSIS — M47816 Spondylosis without myelopathy or radiculopathy, lumbar region: Secondary | ICD-10-CM | POA: Diagnosis not present

## 2021-07-19 DIAGNOSIS — M5416 Radiculopathy, lumbar region: Secondary | ICD-10-CM | POA: Diagnosis not present

## 2021-07-20 NOTE — Progress Notes (Signed)
Subjective: 81 y.o. returns the office today for painful, elongated, thickened toenails which he cannot trim himself and also a painful corn second toe on the left foot.  He thinks that he has gotten the left second toe he was seen by his primary care physician was started on prednisone which has helped some.  He has a thick callus at the tip of the second toe.  He has not seen any drainage or pus.  Denies any systemic complaints such as fevers, chills, nausea, vomiting.   PCP: [name], MD Last seen July 08, 2020 by Dr. Pricilla Holm, MD  Objective: AAO 3, NAD DP/PT pulses palpable, CRT less than 3 seconds Neurological status is unchanged.  Nails hypertrophic, dystrophic, elongated, brittle, discolored 10. There is tenderness overlying the nails 1-5 bilaterally. There is no surrounding erythema or drainage along the nail sites. Thick hyperkeratotic tissue present distal aspect left second toe resulting in a superficial area of skin breakdown upon debridement.  There is localized edema and erythema present.  There is no ascending cellulitis.  There is no probing, undermining or tunneling.  No fluctuation or crepitation.  There is no malodor. There is deformity present the left third toe and the toe is rigid contracture at the level of the DIPJ.   Hammertoes present.  No pain with calf compression, swelling, warmth, erythema.  Assessment: Patient presents with symptomatic onychomycosis; hyperkeratotic lesion; left second toe infection  Plan: -Treatment options including alternatives, risks, complications were discussed -Nails sharply debrided 10 without complication/bleeding. -Hyperkeratotic lesion sharply debrided x 1 without any complications to reveal the underlying superficial wound.  Given the localized edema and erythema prescribed cephalexin as I think this is probably more infection at this point but cannot exclude gout.  Offloading.  Monitor for any signs or  symptoms of any worsening and if any should occur to report to the emergency department.  Trula Slade DPM

## 2021-07-22 DIAGNOSIS — M47816 Spondylosis without myelopathy or radiculopathy, lumbar region: Secondary | ICD-10-CM | POA: Insufficient documentation

## 2021-07-25 ENCOUNTER — Other Ambulatory Visit: Payer: Self-pay

## 2021-07-25 ENCOUNTER — Ambulatory Visit (INDEPENDENT_AMBULATORY_CARE_PROVIDER_SITE_OTHER): Payer: PPO

## 2021-07-25 DIAGNOSIS — Z0000 Encounter for general adult medical examination without abnormal findings: Secondary | ICD-10-CM

## 2021-07-25 NOTE — Progress Notes (Addendum)
Subjective:   Justin Taras. is a 81 y.o. male who presents for an Subsequent Medicare Annual Wellness Visit.  I connected with Justin [name] today by telephone and verified that I am speaking with the correct person using two identifiers. Location patient: home Location provider: work Persons participating in the virtual visit: patient, provider.   I discussed the limitations, risks, security and privacy concerns of performing an evaluation and management service by telephone and the availability of in person appointments. I also discussed with the patient that there may be a patient responsible charge related to this service. The patient expressed understanding and verbally consented to this telephonic visit.    Interactive audio and video telecommunications were attempted between this provider and patient, however failed, due to patient having technical difficulties OR patient did not have access to video capability.  We continued and completed visit with audio only.    Review of Systems     Cardiac Risk Factors include: advanced age (>103men, >57 women);male gender;hypertension;dyslipidemia     Objective:    Today's Vitals   There is no height or weight on file to calculate BMI.  Advanced Directives 07/25/2021 11/09/2020 10/28/2020 07/09/2020 06/14/2020 05/24/2020 03/22/2020  Does Patient Have a Medical Advance Directive? Yes No No No No No No  Type of Paramedic of [place];Living will - - - - - -  Does patient want to make changes to medical advance directive? - - - - - - -  Copy of [place] in Chart? No - copy requested - - - - - -  Would patient like information on creating a medical advance directive? - No - Patient declined No - Patient declined No - Patient declined No - Patient declined - -    Current Medications (verified) Outpatient Encounter Medications as of 07/25/2021  Medication Sig   Acetaminophen (TYLENOL PO)  Tylenol   amoxicillin (AMOXIL) 500 MG capsule amoxicillin 500 mg capsule  4 po 1 hr prior to dental procedure   carbidopa-levodopa (SINEMET IR) 25-100 MG tablet For breakthrough restless leg, you can take 1 tablet at bedtime.  OK to take extra tab as needed during the day. (Patient taking differently: Take 1 tablet by mouth at bedtime.)   cephALEXin (KEFLEX) 500 MG capsule Take 1 capsule (500 mg total) by mouth 3 (three) times daily.   Cholecalciferol 1000 UNITS tablet Take 3,000 Units by mouth daily.   Cyanocobalamin (VITAMIN B-12) 1000 MCG SUBL DISSOLVE 2 TABLETS IN MOUTH EVERY DAY   famotidine (PEPCID) 20 MG tablet Take 20 mg by mouth daily as needed for heartburn or indigestion. [facility] brand pepcid   ferrous sulfate 325 (65 FE) MG tablet Take 650 mg by mouth daily with breakfast.   finasteride (PROSCAR) 5 MG tablet Take 1 tablet (5 mg total) by mouth daily.   gabapentin (NEURONTIN) 300 MG capsule Take 1 capsule (300 mg total) by mouth 4 (four) times daily.   ibuprofen (ADVIL) 200 MG tablet Take 400 mg by mouth daily as needed.   magnesium hydroxide (MILK OF MAGNESIA) 400 MG/5ML suspension Take 15 mLs by mouth daily as needed for mild constipation.   meclizine (ANTIVERT) 12.5 MG tablet TAKE 1 TABLET BY MOUTH 3 TIMES DAILY AS NEEDED FOR DIZZINESS.   Multiple Vitamins-Minerals (PRESERVISION AREDS PO) Take 2 tablets by mouth 2 (two) times daily.   Oxycodone HCl 10 MG TABS Take 10 mg by mouth 5 (five) times daily as needed (pain). Typically only  uses 4 times daily   Polyethyl Glycol-Propyl Glycol (SYSTANE OP) Place 1 drop into both eyes 2 (two) times daily as needed (dry eyes).   pravastatin (PRAVACHOL) 20 MG tablet Take 1 tablet (20 mg total) by mouth daily.   predniSONE (DELTASONE) 20 MG tablet Take 2 tablets (40 mg total) by mouth daily with breakfast.   rOPINIRole (REQUIP) 2 MG tablet TAKE 1 TABLET BY MOUTH AT 12PM, 1 TABLET AT 4PM AND 1 TABLETS AT BEDTIME (Patient taking differently: Take 2-4 mg  by mouth See admin instructions. Take 2 mg by mouth with breakfast, lunch, and dinner and 4 mg at bedtime)   tamsulosin (FLOMAX) 0.4 MG CAPS capsule Take 1 capsule (0.4 mg total) by mouth in the morning.   terazosin (HYTRIN) 2 MG capsule TAKE 1 CAPSULE BY MOUTH EVERYDAY AT BEDTIME   No facility-administered encounter medications on file as of 07/25/2021.    Allergies (verified) Levofloxacin   History: Past Medical History:  Diagnosis Date   Alcoholism ([place])    Dry 13 years   Basal cell carcinoma    Right Chest   BPH (benign prostatic hyperplasia)    Diverticulosis of colon    Dizzy spells    none recent   DJD (degenerative joint disease)    lower back   Elbow fracture, left 2009   [name]   GERD (gastroesophageal reflux disease)    Glaucoma    History of TIAs 1 yrs ago   Hyperkeratosis    LBP (low back pain)    Macular degeneration    left wet right dry sees [name]   [facility]) 2009   x3 [name]   Osteoarthritis    Restless leg syndrome    Stroke [facility]) 2005   TIA   Tinnitus    Tobacco abuse    Uses walker    Wears glasses    reading   Wears partial dentures    upper   Past Surgical History:  Procedure Laterality Date   CATARACT EXTRACTION Bilateral 2018   HARDWARE REMOVAL Left 07/09/2020   Procedure: Left elbow hardware removal;  Surgeon: [name], MD;  Location: [facility];  Service: Orthopedics;  Laterality: Left;  60 mins   mda     Dr. [name] wet injection   MELANOMA EXCISION  2009   x3   RECONSTRUCTION MEDIAL COLLATERAL LIGAMENT ELBOW W/ TENDON GRAFT  2009   Left, [name]   TONSILLECTOMY  as child   TOTAL KNEE ARTHROPLASTY Right 11/09/2020   Procedure: TOTAL KNEE ARTHROPLASTY;  Surgeon: [name], MD;  Location: [facility];  Service: Orthopedics;  Laterality: Right;   Family History  Problem Relation Age of Onset   Cancer Mother        ?breast   Cancer Father        Lung   Social History   Socioeconomic  History   Marital status: Married    Spouse name: Not on file   Number of children: 2   Years of education: Not on file   Highest education level: Not on file  Occupational History   Occupation: [place] Veteran - [employer]    Employer: RETIRED   Occupation: Chiropodist   Occupation: Heritage manager  Tobacco Use   Smoking status: Former    Packs/day: 3.00    Years: 38.00    Pack years: 114.00    Types: Cigarettes    Quit date: 1986    Years since  quitting: 37.0   Smokeless tobacco: Never   Tobacco comments:    states he quit May 15 1985  Vaping Use   Vaping Use: Never used  Substance and Sexual Activity   Alcohol use: No    Alcohol/week: 0.0 standard drinks    Comment: PREVIOUS - DRY 30yrs   Drug use: Not Currently   Sexual activity: Not Currently  Other Topics Concern   Not on file  Social History Narrative   Right handed   One story home   Drinks coffee q am   Social Determinants of Health   Financial Resource Strain: Low Risk    Difficulty of Paying Living Expenses: Not hard at all  Food Insecurity: No Food Insecurity   Worried About Charity fundraiser in the Last Year: Never true   Ran Out of Food in the Last Year: Never true  Transportation Needs: No Transportation Needs   Lack of Transportation (Medical): No   Lack of Transportation (Non-Medical): No  Physical Activity: Insufficiently Active   Days of Exercise per Week: 3 days   Minutes of Exercise per Session: 30 min  Stress: No Stress Concern Present   Feeling of Stress : Not at all  Social Connections: Moderately Isolated   Frequency of Communication with Friends and Family: Twice a week   Frequency of Social Gatherings with Friends and Family: Twice a week   Attends Religious Services: Never   Printmaker: No   Attends Music therapist: Never   Marital Status: Married    Tobacco Counseling Counseling given: Not Answered Tobacco comments:  states he quit May 15 1985   Clinical Intake:  Pre-visit preparation completed: Yes  Pain : No/denies pain     Nutritional Risks: None Diabetes: No  How often do you need to have someone help you when you read instructions, pamphlets, or other written materials from your doctor or pharmacy?: 1 - Never What is the last grade level you completed in school?: [place] Needed?: No  Information entered by :: L.Michaella Imai,LPN   Activities of Daily Living In your present state of health, do you have any difficulty performing the following activities: 07/25/2021 11/09/2020  Hearing? N -  Vision? N -  Difficulty concentrating or making decisions? N -  Walking or climbing stairs? N -  Comment - -  Dressing or bathing? N -  Doing errands, shopping? N N  Preparing Food and eating ? N -  Using the Toilet? N -  In the past six months, have you accidently leaked urine? N -  Do you have problems with loss of bowel control? N -  Managing your Medications? N -  Managing your Finances? N -  Housekeeping or managing your Housekeeping? N -  Some recent data might be hidden    Patient Care Team: Plotnikov, Evie Lacks, MD as PCP - General Carol Ada, MD as Consulting Physician (Gastroenterology) Martinique, Amy, MD as Consulting Physician (Dermatology) Alda Berthold, DO as Consulting Physician (Neurology) [name], MD as Consulting Physician (Physical Medicine and Rehabilitation) [name], MD as Consulting Physician (Orthopedic Surgery) Szabat, Darnelle Maffucci, [facility] as Pharmacist (Pharmacist)  Indicate any recent Medical Services you may have received from other than [facility] providers in the past year (date may be approximate).     Assessment:   This is a routine wellness examination for [place].  Hearing/Vision screen Vision Screening - Comments:: Annual eye exams wears glasses  Dietary issues and exercise activities discussed: Current Exercise Habits: Home  exercise routine, Type of exercise: strength training/weights, Time (Minutes): 30, Frequency (Times/Week): 3, Weekly Exercise (Minutes/Week): 90, Intensity: Mild, Exercise limited by: orthopedic condition(s)   Goals Addressed   None    Depression Screen PHQ 2/9 Scores 07/25/2021 07/25/2021 06/14/2020 06/12/2019 05/14/2018 05/03/2017 11/30/2016  PHQ - 2 Score 0 0 0 0 1 2 0  PHQ- 9 Score - - - 2 4 3 7     Fall Risk Fall Risk  07/25/2021 06/14/2020 05/24/2020 03/22/2020 10/15/2019  Falls in the past year? 0 0 0 0 0  Number falls in past yr: 0 0 0 0 -  Injury with Fall? 0 0 0 0 -  Risk for fall due to : - No Fall Risks - - -  Follow up Falls evaluation completed Falls evaluation completed - - -  Comment cane - - - -    FALL RISK PREVENTION PERTAINING TO THE HOME:  Any stairs in or around the home? No  If so, are there any without handrails? No  Home free of loose throw rugs in walkways, pet beds, electrical cords, etc? Yes  Adequate lighting in your home to reduce risk of falls? Yes   ASSISTIVE DEVICES UTILIZED TO PREVENT FALLS:  Life alert? No  Use of a cane, walker or w/c? Yes  Grab bars in the bathroom? No  Shower chair or bench in shower? No  Elevated toilet seat or a handicapped toilet? No    Cognitive Function:Normal cognitive status assessed by direct observation by this Nurse Health Advisor. No abnormalities found.   MMSE - Mini Mental State Exam 05/03/2017  Orientation to time 5  Orientation to Place 5  Registration 3  Attention/ Calculation 5  Recall 2  Language- name 2 objects 2  Language- repeat 1  Language- follow 3 step command 3  Language- read & follow direction 1  Write a sentence 1  Copy design 1  Total score 29        Immunizations Immunization History  Administered Date(s) Administered   Fluad Quad(high Dose 65+) 03/19/2019, 03/15/2020, 04/19/2021   Influenza Split 03/30/2011, 05/09/2012   Influenza Whole 04/22/2008, 03/31/2009   Influenza, High  Dose Seasonal PF 05/07/2013, 03/23/2016, 03/27/2017, 04/03/2018   Influenza,inj,Quad PF,6+ Mos 04/07/2014, 04/29/2015   Influenza-Unspecified 04/02/2006   PFIZER(Purple Top)SARS-COV-2 Vaccination 08/07/2019, 09/01/2019, 04/27/2020   Pneumococcal Conjugate-13 06/13/2013   Pneumococcal Polysaccharide-23 10/27/2009   Pneumococcal-Unspecified 04/02/2004   Td 06/23/2010    TDAP status: Due, Education has been provided regarding the importance of this vaccine. Advised may receive this vaccine at local pharmacy or Health Dept. Aware to provide a copy of the vaccination record if obtained from local pharmacy or Health Dept. Verbalized acceptance and understanding.  Flu Vaccine status: Up to date  Pneumococcal vaccine status: Up to date  Covid-19 vaccine status: Completed vaccines  Qualifies for Shingles Vaccine? Yes   Zostavax completed No   Shingrix Completed?: No.    Education has been provided regarding the importance of this vaccine. Patient has been advised to call insurance company to determine out of pocket expense if they have not yet received this vaccine. Advised may also receive vaccine at local pharmacy or Health Dept. Verbalized acceptance and understanding.  Screening Tests Health Maintenance  Topic Date Due   Zoster Vaccines- Shingrix (1 of 2) Never done   COVID-19 Vaccine (4 - Booster for Pfizer series) 06/22/2020   TETANUS/TDAP  06/23/2020   Pneumonia Vaccine 65+  Years old  Completed   INFLUENZA VACCINE  Completed   HPV [place] Maintenance Due  Topic Date Due   Zoster Vaccines- Shingrix (1 of 2) Never done   COVID-19 Vaccine (4 - Booster for Pfizer series) 06/22/2020   TETANUS/TDAP  06/23/2020    Colorectal cancer screening: No longer required.   Lung Cancer Screening: (Low Dose CT Chest recommended if Age 58-80 years, 30 pack-year currently smoking OR have quit w/in 15years.) does not qualify.   Lung Cancer Screening  Referral: n/a  Additional Screening:  Hepatitis C Screening: does not qualify;   Vision Screening: Recommended annual ophthalmology exams for early detection of glaucoma and other disorders of the eye. Is the patient up to date with their annual eye exam?  Yes  Who is the provider or what is the name of the office in which the patient attends annual eye exams? [facility]  If pt is not established with a provider, would they like to be referred to a provider to establish care? No .   Dental Screening: Recommended annual dental exams for proper oral hygiene  Community Resource Referral / Chronic Care Management: CRR required this visit?  No   CCM required this visit?  No      Plan:     I have personally reviewed and noted the following in the patients chart:   Medical and social history Use of alcohol, tobacco or illicit drugs  Current medications and supplements including opioid prescriptions. Patient is not currently taking opioid prescriptions. Functional ability and status Nutritional status Physical activity Advanced directives List of other physicians Hospitalizations, surgeries, and ER visits in previous 12 months Vitals Screenings to include cognitive, depression, and falls Referrals and appointments  In addition, I have reviewed and discussed with patient certain preventive protocols, quality metrics, and best practice recommendations. A written personalized care plan for preventive services as well as general preventive health recommendations were provided to patient.     Randel Pigg, LPN   2/75/1700   Nurse Notes: none    Medical screening examination/treatment/procedure(s) were performed by non-physician practitioner and as supervising physician I was immediately available for consultation/collaboration.  I agree with above. [name], MD

## 2021-07-25 NOTE — Patient Instructions (Signed)
[name] , Thank you for taking time to come for your Medicare Wellness Visit. I appreciate your ongoing commitment to your health goals. Please review the following plan we discussed and let me know if I can assist you in the future.   Screening recommendations/referrals: Colonoscopy: no longer required  Recommended yearly ophthalmology/optometry visit for glaucoma screening and checkup Recommended yearly dental visit for hygiene and checkup  Vaccinations: Influenza vaccine: completed  Pneumococcal vaccine: completed  Tdap vaccine: due  Shingles vaccine: will consider     Advanced directives: yes   Conditions/risks identified: none   Next appointment: none   Preventive Care 38 Years and Older, Male Preventive care refers to lifestyle choices and visits with your health care provider that can promote health and wellness. What does preventive care include? A yearly physical exam. This is also called an annual well check. Dental exams once or twice a year. Routine eye exams. Ask your health care provider how often you should have your eyes checked. Personal lifestyle choices, including: Daily care of your teeth and gums. Regular physical activity. Eating a healthy diet. Avoiding tobacco and drug use. Limiting alcohol use. Practicing safe sex. Taking low doses of aspirin every day. Taking vitamin and mineral supplements as recommended by your health care provider. What happens during an annual well check? The services and screenings done by your health care provider during your annual well check will depend on your age, overall health, lifestyle risk factors, and family history of disease. Counseling  Your health care provider may ask you questions about your: Alcohol use. Tobacco use. Drug use. Emotional well-being. Home and relationship well-being. Sexual activity. Eating habits. History of falls. Memory and ability to understand (cognition). Work and work  Statistician. Screening  You may have the following tests or measurements: Height, weight, and BMI. Blood pressure. Lipid and cholesterol levels. These may be checked every 5 years, or more frequently if you are over 59 years old. Skin check. Lung cancer screening. You may have this screening every year starting at age 3 if you have a 30-pack-year history of smoking and currently smoke or have quit within the past 15 years. Fecal occult blood test (FOBT) of the stool. You may have this test every year starting at age 29. Flexible sigmoidoscopy or colonoscopy. You may have a sigmoidoscopy every 5 years or a colonoscopy every 10 years starting at age 39. Prostate cancer screening. Recommendations will vary depending on your family history and other risks. Hepatitis C blood test. Hepatitis B blood test. Sexually transmitted disease (STD) testing. Diabetes screening. This is done by checking your blood sugar (glucose) after you have not eaten for a while (fasting). You may have this done every 1-3 years. Abdominal aortic aneurysm (AAA) screening. You may need this if you are a current or former smoker. Osteoporosis. You may be screened starting at age 21 if you are at high risk. Talk with your health care provider about your test results, treatment options, and if necessary, the need for more tests. Vaccines  Your health care provider may recommend certain vaccines, such as: Influenza vaccine. This is recommended every year. Tetanus, diphtheria, and acellular pertussis (Tdap, Td) vaccine. You may need a Td booster every 10 years. Zoster vaccine. You may need this after age 12. Pneumococcal 13-valent conjugate (PCV13) vaccine. One dose is recommended after age 23. Pneumococcal polysaccharide (PPSV23) vaccine. One dose is recommended after age 93. Talk to your health care provider about which screenings and vaccines you need  and how often you need them. This information is not intended to replace  advice given to you by your health care provider. Make sure you discuss any questions you have with your health care provider. Document Released: 07/16/2015 Document Revised: 03/08/2016 Document Reviewed: 04/20/2015 Elsevier Interactive Patient Education  2017 [place] Prevention in the Home Falls can cause injuries. They can happen to people of all ages. There are many things you can do to make your home safe and to help prevent falls. What can I do on the outside of my home? Regularly fix the edges of walkways and driveways and fix any cracks. Remove anything that might make you trip as you walk through a door, such as a raised step or threshold. Trim any bushes or trees on the path to your home. Use bright outdoor lighting. Clear any walking paths of anything that might make someone trip, such as rocks or tools. Regularly check to see if handrails are loose or broken. Make sure that both sides of any steps have handrails. Any raised decks and porches should have guardrails on the edges. Have any leaves, snow, or ice cleared regularly. Use sand or salt on walking paths during winter. Clean up any spills in your garage right away. This includes oil or grease spills. What can I do in the bathroom? Use night lights. Install grab bars by the toilet and in the tub and shower. Do not use towel bars as grab bars. Use non-skid mats or decals in the tub or shower. If you need to sit down in the shower, use a plastic, non-slip stool. Keep the floor dry. Clean up any water that spills on the floor as soon as it happens. Remove soap buildup in the tub or shower regularly. Attach bath mats securely with double-sided non-slip rug tape. Do not have throw rugs and other things on the floor that can make you trip. What can I do in the bedroom? Use night lights. Make sure that you have a light by your bed that is easy to reach. Do not use any sheets or blankets that are too big for your bed.  They should not hang down onto the floor. Have a firm chair that has side arms. You can use this for support while you get dressed. Do not have throw rugs and other things on the floor that can make you trip. What can I do in the kitchen? Clean up any spills right away. Avoid walking on wet floors. Keep items that you use a lot in easy-to-reach places. If you need to reach something above you, use a strong step stool that has a grab bar. Keep electrical cords out of the way. Do not use floor polish or wax that makes floors slippery. If you must use wax, use non-skid floor wax. Do not have throw rugs and other things on the floor that can make you trip. What can I do with my stairs? Do not leave any items on the stairs. Make sure that there are handrails on both sides of the stairs and use them. Fix handrails that are broken or loose. Make sure that handrails are as long as the stairways. Check any carpeting to make sure that it is firmly attached to the stairs. Fix any carpet that is loose or worn. Avoid having throw rugs at the top or bottom of the stairs. If you do have throw rugs, attach them to the floor with carpet tape. Make sure that you  have a light switch at the top of the stairs and the bottom of the stairs. If you do not have them, ask someone to add them for you. What else can I do to help prevent falls? Wear shoes that: Do not have high heels. Have rubber bottoms. Are comfortable and fit you well. Are closed at the toe. Do not wear sandals. If you use a stepladder: Make sure that it is fully opened. Do not climb a closed stepladder. Make sure that both sides of the stepladder are locked into place. Ask someone to hold it for you, if possible. Clearly mark and make sure that you can see: Any grab bars or handrails. First and last steps. Where the edge of each step is. Use tools that help you move around (mobility aids) if they are needed. These  include: Canes. Walkers. Scooters. Crutches. Turn on the lights when you go into a dark area. Replace any light bulbs as soon as they burn out. Set up your furniture so you have a clear path. Avoid moving your furniture around. If any of your floors are uneven, fix them. If there are any pets around you, be aware of where they are. Review your medicines with your doctor. Some medicines can make you feel dizzy. This can increase your chance of falling. Ask your doctor what other things that you can do to help prevent falls. This information is not intended to replace advice given to you by your health care provider. Make sure you discuss any questions you have with your health care provider. Document Released: 04/15/2009 Document Revised: 11/25/2015 Document Reviewed: 07/24/2014 Elsevier Interactive Patient Education  2017 [employer].

## 2021-07-28 ENCOUNTER — Ambulatory Visit: Payer: PPO | Admitting: Internal Medicine

## 2021-08-02 ENCOUNTER — Ambulatory Visit: Payer: PPO | Admitting: Podiatry

## 2021-08-02 DIAGNOSIS — M47816 Spondylosis without myelopathy or radiculopathy, lumbar region: Secondary | ICD-10-CM | POA: Diagnosis not present

## 2021-08-11 ENCOUNTER — Other Ambulatory Visit: Payer: Self-pay | Admitting: Internal Medicine

## 2021-08-11 MED ORDER — ROPINIROLE HCL 2 MG PO TABS: ORAL_TABLET | ORAL | 1 refills | Status: DC

## 2021-08-15 DIAGNOSIS — M5416 Radiculopathy, lumbar region: Secondary | ICD-10-CM | POA: Diagnosis not present

## 2021-08-17 DIAGNOSIS — Z85828 Personal history of other malignant neoplasm of skin: Secondary | ICD-10-CM | POA: Diagnosis not present

## 2021-08-17 DIAGNOSIS — L570 Actinic keratosis: Secondary | ICD-10-CM | POA: Diagnosis not present

## 2021-08-17 DIAGNOSIS — D225 Melanocytic nevi of trunk: Secondary | ICD-10-CM | POA: Diagnosis not present

## 2021-08-17 DIAGNOSIS — D2261 Melanocytic nevi of right upper limb, including shoulder: Secondary | ICD-10-CM | POA: Diagnosis not present

## 2021-08-17 DIAGNOSIS — D2271 Melanocytic nevi of right lower limb, including hip: Secondary | ICD-10-CM | POA: Diagnosis not present

## 2021-08-17 DIAGNOSIS — D1801 Hemangioma of skin and subcutaneous tissue: Secondary | ICD-10-CM | POA: Diagnosis not present

## 2021-08-17 DIAGNOSIS — D692 Other nonthrombocytopenic purpura: Secondary | ICD-10-CM | POA: Diagnosis not present

## 2021-08-17 DIAGNOSIS — L821 Other seborrheic keratosis: Secondary | ICD-10-CM | POA: Diagnosis not present

## 2021-09-01 DIAGNOSIS — H353113 Nonexudative age-related macular degeneration, right eye, advanced atrophic without subfoveal involvement: Secondary | ICD-10-CM | POA: Diagnosis not present

## 2021-09-01 DIAGNOSIS — H353221 Exudative age-related macular degeneration, left eye, with active choroidal neovascularization: Secondary | ICD-10-CM | POA: Diagnosis not present

## 2021-09-01 DIAGNOSIS — H402211 Chronic angle-closure glaucoma, right eye, mild stage: Secondary | ICD-10-CM | POA: Diagnosis not present

## 2021-09-01 DIAGNOSIS — H402222 Chronic angle-closure glaucoma, left eye, moderate stage: Secondary | ICD-10-CM | POA: Diagnosis not present

## 2021-09-08 ENCOUNTER — Ambulatory Visit (INDEPENDENT_AMBULATORY_CARE_PROVIDER_SITE_OTHER): Payer: PPO

## 2021-09-08 DIAGNOSIS — G629 Polyneuropathy, unspecified: Secondary | ICD-10-CM

## 2021-09-08 DIAGNOSIS — E559 Vitamin D deficiency, unspecified: Secondary | ICD-10-CM

## 2021-09-08 DIAGNOSIS — G2581 Restless legs syndrome: Secondary | ICD-10-CM

## 2021-09-08 DIAGNOSIS — I2510 Atherosclerotic heart disease of native coronary artery without angina pectoris: Secondary | ICD-10-CM

## 2021-09-08 DIAGNOSIS — I10 Essential (primary) hypertension: Secondary | ICD-10-CM

## 2021-09-08 NOTE — Patient Instructions (Signed)
Visit Information ? ?Following are the goals we discussed today:  ? ?Manage My Medicine  ? ?Timeframe:  Long-Range Goal ?Priority:  High ?Start Date:  03/22/2021                          ?Expected End Date: 09/20/2022                     ? ?Follow Up Date Sept. 2023 ?  ?- call for medicine refill 2 or 3 days before it runs out ?- keep a list of all the medicines I take; vitamins and herbals too ?- learn to read medicine labels ?- use a pillbox to sort medicine  ?  ?Why is this important?   ?These steps will help you keep on track with your medicines. ? ?Plan: Telephone follow up appointment with care management team member scheduled for:  6 months  ?The patient has been provided with contact information for the care management team and has been advised to call with any health related questions or concerns.  ? ?Tomasa Blase, PharmD ?Clinical Pharmacist, [place]  ? ?Please call the care guide team at [phone] if you need to cancel or reschedule your appointment.  ? ?Patient verbalizes understanding of instructions and care plan provided today and agrees to view in [facility]. Active MyChart status confirmed with patient.   ? ?

## 2021-09-08 NOTE — Progress Notes (Cosign Needed)
Chronic Care Management Pharmacy Note  09/08/2021 Name:  Justin [name] [facility]. MRN:  [id number] DOB:  [birth year]  Summary: -Patient reports that since increasing gabapentin at night RLS control has improved -Reports compliance to current medications, denies any issues or concerns -BP controlled with most recent office visits, denies any issues with BP control recently   Recommendations/Changes made from today's visit: -Recommending no changes to medications at this time, patient to monitor blood pressure at home at least once weekly and reach out should BP average >140/90 -Encouraged patient to continue with home exercises to stay active/ help keep BP and cholesterol well controlled -Recommending for updated lipid panel,  Vitamin D, PSA, and Vitamin B12 levels to be taken with next PCP visit  -Advised for patient to receive COVID booster and Shingrix vaccines with next visit to pharmacy, to update tdap at PCP / pharmacy with next visit   Subjective: Justin [name]. is an 81 y.o. year old male who is a primary patient of Plotnikov, Evie Lacks, MD.  The CCM team was consulted for assistance with disease management and care coordination needs.    Engaged with patient by telephone for follow up visit in response to provider referral for pharmacy case management and/or care coordination services.   Consent to Services:  The patient was given the following information about Chronic Care Management services today, agreed to services, and gave verbal consent: 1. CCM service includes personalized support from designated clinical staff supervised by the primary care provider, including individualized plan of care and coordination with other care providers 2. 24/7 contact phone numbers for assistance for urgent and routine care needs. 3. Service will only be billed when office clinical staff spend 20 minutes or more in a month to coordinate care. 4. Only one practitioner may furnish and bill the  service in a calendar month. 5.The patient may stop CCM services at any time (effective at the end of the month) by phone call to the office staff. 6. The patient will be responsible for cost sharing (co-pay) of up to 20% of the service fee (after annual deductible is met). Patient agreed to services and consent obtained.  Patient Care Team: Plotnikov, Evie Lacks, MD as PCP - General Carol Ada, MD as Consulting Physician (Gastroenterology) Martinique, Amy, MD as Consulting Physician (Dermatology) Alda Berthold, DO as Consulting Physician (Neurology) [name], MD as Consulting Physician (Physical Medicine and Rehabilitation) [name], MD as Consulting Physician (Orthopedic Surgery) Tomasa Blase, [facility] as Pharmacist (Pharmacist)  Recent office visits:  07/08/2021 - Dr. Sharlet Salina - viral URI symptoms / possible gout - rx'd prednisone burst x 5 days  04/25/2021 - Dr. Quay Burow - televideo - COVID positive - rx'd molnupiravir  04/19/2021 - Dr. Alain Marion - gabapentin 331m/300mg/600mg - f/u in 6 months    Recent consult visits:  07/18/2021 - Dr. WEarleen Newport- Podiatry - rx'd cephalexin for infection of toe - f/u in 2  07/13/2021 - Dr. RZadie Rhine- ophthalmology - eylea injection completed - follow up in 10 weeks  05/05/2021 - Dr. RZadie Rhine- ophthalmology  - eylea injection given - f/u in 10 weeks   04/14/2021 - Dr. WEarleen Newport- Podiatry - no changes to medications - follow up in 3 months   Hospital visits:  11/09/2020 - Right total knee arthroplasty   Objective:  Lab Results  Component Value Date   CREATININE 1.05 04/19/2021   BUN 15 04/19/2021   GFR 66.37 04/19/2021   GFRNONAA >60 11/10/2020  GFRAA 96 04/15/2008   NA 139 04/19/2021   K 4.0 04/19/2021   CALCIUM 9.3 04/19/2021   CO2 26 04/19/2021   GLUCOSE 89 04/19/2021    Lab Results  Component Value Date/Time   HGBA1C 5.3 04/19/2021 10:59 AM   HGBA1C 5.6 07/28/2015 04:43 PM   GFR 66.37 04/19/2021 10:59 AM   GFR 57.63 (L) 10/27/2019  11:55 AM    Last diabetic Eye exam:  No results found for: HMDIABEYEEXA  Last diabetic Foot exam:  No results found for: HMDIABFOOTEX   Lab Results  Component Value Date   CHOL 136 01/12/2017   HDL 54.70 01/12/2017   LDLCALC 68 01/12/2017   TRIG 67.0 01/12/2017   CHOLHDL 2 01/12/2017    Hepatic Function Latest Ref Rng & Units 04/19/2021 10/28/2020 10/16/2019  Total Protein 6.0 - 8.3 g/dL 7.1 6.9 6.5  Albumin 3.5 - 5.2 g/dL 4.3 4.0 4.1  AST 0 - 37 U/L _0 ALT 0 - 53 U/L _1 Alk Phosphatase 39 - 117 U/L 55 60 58  Total Bilirubin 0.2 - 1.2 mg/dL 0.7 0.8 0.4  Bilirubin, Direct 0.0 - 0.3 mg/dL - - -    Lab Results  Component Value Date/Time   TSH 3.05 10/16/2019 03:28 PM   TSH 2.83 07/31/2017 10:23 AM   FREET4 0.92 10/16/2019 03:28 PM   FREET4 1.01 01/14/2015 10:06 AM    CBC Latest Ref Rng & Units 11/10/2020 10/28/2020 10/16/2019  WBC 4.0 - 10.5 K/uL 4.8 3.0(L) 5.1  Hemoglobin 13.0 - 17.0 g/dL 7.9(L) 9.0(L) 10.3(L)  Hematocrit 39.0 - 52.0 % 24.7(L) 27.4(L) 30.0(L)  Platelets 150 - 400 K/uL 112(L) 137(L) 160.0    Lab Results  Component Value Date/Time   VD25OH 50 02/25/2013 04:17 PM   VD25OH 49 10/21/2008 09:07 PM    Clinical ASCVD: No  The ASCVD Risk score (Arnett DK, et al., 2019) failed to calculate for the following reasons:   The 2019 ASCVD risk score is only valid for ages 82 to 10    Depression screen PHQ 2/9 07/25/2021 07/25/2021 06/14/2020  Decreased Interest 0 0 0  Down, Depressed, Hopeless 0 0 0  PHQ - 2 Score 0 0 0  Altered sleeping - - -  Tired, decreased energy - - -  Change in appetite - - -  Feeling bad or failure about yourself  - - -  Trouble concentrating - - -  Moving slowly or fidgety/restless - - -  Suicidal thoughts - - -  PHQ-9 Score - - -  Difficult doing work/chores - - -  Some recent data might be hidden    Social History   Tobacco Use  Smoking Status Former   Packs/day: 3.00   Years: 38.00   Pack years: 114.00    Types: Cigarettes   Quit date: 1986   Years since quitting: 37.2  Smokeless Tobacco Never  Tobacco Comments   states he quit May 15 1985   BP Readings from Last 3 Encounters:  07/08/21 130/62  04/19/21 (!) 146/80  11/10/20 133/66   Pulse Readings from Last 3 Encounters:  07/08/21 70  04/19/21 72  11/10/20 66   Wt Readings from Last 3 Encounters:  07/08/21 168 lb 6.4 oz (76.4 kg)  04/19/21 174 lb (78.9 kg)  11/09/20 177 lb (80.3 kg)   BMI Readings from Last 3 Encounters:  07/08/21 22.84 kg/m  04/19/21 23.60 kg/m  11/09/20 24.01 kg/m    Assessment/Interventions: Review of patient past  medical history, allergies, medications, health status, including review of consultants reports, laboratory and other test data, was performed as part of comprehensive evaluation and provision of chronic care management services.   SDOH:  (Social Determinants of Health) assessments and interventions performed: Yes  SDOH Screenings   Alcohol Screen: Low Risk    Last Alcohol Screening Score (AUDIT): 0  Depression (PHQ2-9): Low Risk    PHQ-2 Score: 0  Financial Resource Strain: Low Risk    Difficulty of Paying Living Expenses: Not hard at all  Food Insecurity: No Food Insecurity   Worried About Charity fundraiser in the Last Year: Never true   Ran Out of Food in the Last Year: Never true  Housing: Low Risk    Last Housing Risk Score: 0  Physical Activity: Insufficiently Active   Days of Exercise per Week: 3 days   Minutes of Exercise per Session: 30 min  Social Connections: Moderately Isolated   Frequency of Communication with Friends and Family: Twice a week   Frequency of Social Gatherings with Friends and Family: Twice a week   Attends Religious Services: Never   Marine scientist or Organizations: No   Attends Music therapist: Never   Marital Status: Married  Stress: No Stress Concern Present   Feeling of Stress : Not at all  Tobacco Use: Medium Risk    Smoking Tobacco Use: Former   Smokeless Tobacco Use: Never   Passive Exposure: Not on Pensions consultant Needs: No Transportation Needs   Lack of Transportation (Medical): No   Lack of Transportation (Non-Medical): No    CCM Care Plan  Allergies  Allergen Reactions   Levofloxacin     REACTION: hands pealed    Medications Reviewed Today     Reviewed by Tomasa Blase, [facility] (Pharmacist) on 09/08/21 at 1037  Med List Status: <None>   Medication Order Taking? Sig Documenting Provider Last Dose Status Informant  Acetaminophen (TYLENOL PO) [id number] Yes Tylenol [provider] Taking Active   amoxicillin (AMOXIL) 500 MG capsule [id number] Yes amoxicillin 500 mg capsule  4 po 1 hr prior to dental procedure [provider] Taking Active   calcium carbonate (TUMS EX) 750 MG chewable tablet [id number] Yes Chew 1 tablet by mouth daily. [provider] Taking Active   carbidopa-levodopa (SINEMET IR) 25-100 MG tablet [id number] Yes For breakthrough restless leg, you can take 1 tablet at bedtime.  OK to take extra tab as needed during the day.  Patient taking differently: Take 1 tablet by mouth at bedtime.   Narda Amber K, DO Taking Active   Cholecalciferol 1000 UNITS tablet [id number] Yes Take 3,000 Units by mouth daily. [provider] Taking Active Self  Cyanocobalamin (VITAMIN B-12) 1000 MCG SUBL [id number] Yes DISSOLVE 2 TABLETS IN MOUTH EVERY DAY Plotnikov, Evie Lacks, MD Taking Active   ferrous sulfate 325 (65 FE) MG tablet [id number] Yes Take 650 mg by mouth daily with breakfast. [provider] Taking Active Self  finasteride (PROSCAR) 5 MG tablet [id number] Yes Take 1 tablet (5 mg total) by mouth daily. Plotnikov, Evie Lacks, MD Taking Active   gabapentin (NEURONTIN) 300 MG capsule [id number] Yes Take 1 capsule (300 mg total) by mouth 4 (four) times daily. Plotnikov, Evie Lacks, MD Taking Active   ibuprofen (ADVIL) 200 MG tablet [id number] Yes Take  400 mg by mouth daily as needed. [provider] Taking Active   magnesium hydroxide (MILK OF MAGNESIA) 400 MG/5ML suspension [id number] Yes Take  15 mLs by mouth daily as needed for mild constipation. [provider] Taking Active   meclizine (ANTIVERT) 12.5 MG tablet [id number] Yes TAKE 1 TABLET BY MOUTH 3 TIMES DAILY AS NEEDED FOR DIZZINESS. Plotnikov, Evie Lacks, MD Taking Active   Multiple Vitamins-Minerals (PRESERVISION AREDS PO) [id number] Yes Take 2 tablets by mouth 2 (two) times daily. [provider] Taking Active Self  Oxycodone HCl 10 MG TABS [id number] Yes Take 10 mg by mouth 5 (five) times daily as needed (pain). Typically only uses 4 times daily [provider] Taking Active Self  Polyethyl Glycol-Propyl Glycol (SYSTANE OP) [id number] Yes Place 1 drop into both eyes 2 (two) times daily as needed (dry eyes). [provider] Taking Active Self  pravastatin (PRAVACHOL) 20 MG tablet [id number] Yes Take 1 tablet (20 mg total) by mouth daily. Plotnikov, Evie Lacks, MD Taking Active   rOPINIRole (REQUIP) 2 MG tablet [id number] Yes TAKE 1 TABLET BY MOUTH AT 12 PM 1 TABLET AT 4 PM AND 1 TABLETS AT BEDTIME Plotnikov, Evie Lacks, MD Taking Active   tamsulosin (FLOMAX) 0.4 MG CAPS capsule [id number] Yes Take 1 capsule (0.4 mg total) by mouth in the morning. Plotnikov, Evie Lacks, MD Taking Active   terazosin (HYTRIN) 2 MG capsule [id number] Yes TAKE 1 CAPSULE BY MOUTH EVERYDAY AT BEDTIME Plotnikov, Evie Lacks, MD Taking Active             Patient Active Problem List   Diagnosis Date Noted   Left foot pain 07/08/2021   Viral URI 07/08/2021   COVID 04/25/2021   S/P total knee arthroplasty, right 11/09/2020   Preop exam for internal medicine 10/21/2020   Weight loss 08/30/2020   Elbow pain, chronic, left 05/31/2020   Knee pain, right 58/30/9407   Diastolic dysfunction 68/01/8109   Rash 10/16/2019   Exudative age-related macular degeneration of left eye with  active choroidal neovascularization ([place]) 10/15/2019   Intermediate stage nonexudative age-related macular degeneration of left eye 10/15/2019   Early stage nonexudative age-related macular degeneration of right eye 10/15/2019   Pseudophakia 10/15/2019   Posterior vitreous detachment of left eye 10/15/2019   Cough 09/08/2019   Hamstring injury, left, initial encounter 12/20/2018   GERD (gastroesophageal reflux disease) 11/13/2018   Chest pain 11/13/2018   Cellulitis of hand, left 11/09/2018   Coronary artery disease 10/24/2018   Claudication ([place]) 02/22/2018   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   Gallstones 11/23/2017   Anemia 08/28/2017   DOE (dyspnea on exertion) 07/31/2017   Fatigue 07/31/2017   HTN (hypertension) 11/29/2016   Acute bronchitis 04/12/2016   Smoking greater than 30 pack years 02/02/2016   Vertigo 02/09/2015   Left hand pain 01/14/2015   Elevated TSH 04/09/2014   Abdominal pain, lower 04/07/2014   Well adult exam 02/28/2013   Alcoholism in recovery ([place]) 02/28/2013   Ingrowing toenail of left foot 08/29/2012   Allergic rhinitis 10/10/2011   Dupuytren's contracture of hand 07/05/2011   Vitamin B12 deficiency 12/23/2010   BPH (benign prostatic hyperplasia) 12/23/2010   SINUSITIS, ACUTE 03/24/2010   CHEST PAIN 03/01/2010   Low back pain 07/27/2009   TOBACCO USE, QUIT 07/27/2009   DIVERTICULOSIS, COLON 03/28/2009   DIVERTICULITIS 03/26/2009   ABDOMINAL PAIN, LOWER 03/26/2009   RLS (restless legs syndrome) 10/28/2008   SKIN RASH, ALLERGIC 10/01/2008   ARM PAIN, LEFT 10/01/2008   Edema 10/01/2008   Neuropathy 04/22/2008   UTI 02/26/2008   RECTAL FISSURE 02/24/2008   FEVER UNSPECIFIED 02/24/2008  Vitamin D deficiency 01/21/2008   INSOMNIA, CHRONIC 12/23/2007   Depression 12/23/2007   Osteoarthritis 12/23/2007   SYNCOPE 12/23/2007   DIZZINESS 12/23/2007   CAROTID BRUIT, LEFT 12/23/2007   FRACTURE, ARM, LEFT 12/23/2007    Immunization  History  Administered Date(s) Administered   Fluad Quad(high Dose 65+) 03/19/2019, 03/15/2020, 04/19/2021   Influenza Split 03/30/2011, 05/09/2012   Influenza Whole 04/22/2008, 03/31/2009   Influenza, High Dose Seasonal PF 05/07/2013, 03/23/2016, 03/27/2017, 04/03/2018   Influenza,inj,Quad PF,6+ Mos 04/07/2014, 04/29/2015   Influenza-Unspecified 04/02/2006   PFIZER(Purple Top)SARS-COV-2 Vaccination 08/07/2019, 09/01/2019, 04/27/2020   Pneumococcal Conjugate-13 06/13/2013   Pneumococcal Polysaccharide-23 10/27/2009   Pneumococcal-Unspecified 04/02/2004   Td 06/23/2010    Conditions to be addressed/monitored:  Hypertension, Hyperlipidemia, Coronary Artery Disease, Osteoarthritis, Neuropathy, BPH, GERD, and RLS  Care Plan : CCM Care Plan  Updates made by Tomasa Blase, [place] since 09/08/2021 12:00 AM     Problem: Hypertension, Hyperlipidemia, Coronary Artery Disease, Osteoarthritis, Neuropathy, BPH, GERD, and RLS   Priority: High  Onset Date: 03/22/2021     Long-Range Goal: Disease Management   Start Date: 03/22/2021  Expected End Date: 09/09/2022  Recent Progress: On track  Priority: High  Note:   Current Barriers:  Unable to independently monitor therapeutic efficacy  Pharmacist Clinical Goal(s):  Patient will verbalize ability to afford treatment regimen achieve adherence to monitoring guidelines and medication adherence to achieve therapeutic efficacy contact provider office for questions/concerns as evidenced notation of same in electronic health record through collaboration with PharmD and provider.   Interventions: 1:1 collaboration with Plotnikov, Evie Lacks, MD regarding development and update of comprehensive plan of care as evidenced by provider attestation and co-signature Inter-disciplinary care team collaboration (see longitudinal plan of care) Comprehensive medication review performed; medication list updated in electronic medical record  Hypertension (BP goal  <140/90) -Controlled -Current treatment: Terazosin 67m - 1 capsule at bedtime  -Medications previously tried: furosemide   -Current home readings: n/a - patient reports that he has not been monitoring at home - son previously would help him monitor BP Readings from Last 3 Encounters:  07/08/21 130/62  04/19/21 (!) 146/80  11/10/20 133/66  -Current dietary habits: reports to consuming a low sodium diet -Current exercise habits: home PT (does 4 different exercises everyday to every other day) -Denies hypotensive/hypertensive symptoms -Educated on BP goals and benefits of medications for prevention of heart attack, stroke and kidney damage; Daily salt intake goal < 2300 mg; Exercise goal of 150 minutes per week; Importance of home blood pressure monitoring; Proper BP monitoring technique; -Counseled to monitor BP at home at least once weekly, document, and provide log at future appointments -Counseled on diet and exercise extensively Recommended to continue current medication  Hyperlipidemia/ Coronary Artery Disease : (LDL goal < 70) -Controlled - in need up updated LDL Lab Results  Component Value Date   LDLCALC 68 01/12/2017  -Current treatment: Pravastatin 253m- 1 tablet daily  -Medications previously tried: aspirin -Current dietary patterns: moderate intake of red meats/ fried foods  -Current exercise habits: home PT (does 4 different exercises everyday to every other day) -Educated on Cholesterol goals;  Benefits of statin for ASCVD risk reduction; Importance of limiting foods high in cholesterol; Exercise goal of 150 minutes per week; -Counseled on diet and exercise extensively Recommended to continue current medication, update LDL with next PCP appointment   Restless Leg Syndrome / Insomnia (Goal: Prevention of unwanted leg movements/ promotion of quality sleep) -Controlled - improved with latest increase in  gabapentin  -Current treatment  Ropinirole 31m - 1 tablet 3  times daily, and 2 tablets at bedtime  Gabapentin 3032m- 30046mM / 300m67mternoon / 600mg76mCarbidopa-Levodopa 25-100mg 75mblet at bedtime (for breakthrough RLS) may take additional tablet daily as needed -Medications previously tried: n/a  -Recommended to continue current medication  Chronic Pain - OA/ Neuropathy (Goal: Pain control) - Managed by Dr. Ramos Nelva Bushrolled -Current treatment  Oxycodone 10mg -72mablet 4 times daily as needed Ibuprofen 200mg - 64mblets daily as needed   Gabapentin 300mg - 165msule in AM, 1 capsule at     noon, and 2 capsules in PM -Medications previously tried: norco, voltaren gel -Recommended to continue current medication  GERD (Goal: Prevention/ control of acid reflux) -Controlled -Current treatment  Tums as needed  -Medications previously tried: famotidine   -Counseled on diet and exercise extensively Recommended to continue current medication  BPH (Goal: prevention of disease progression / control of urinary symptoms) -Controlled Lab Results  Component Value Date   PSA 0.47 08/30/2016   PSA 0.47 07/28/2015   PSA 0.90 04/07/2014  -Current treatment  Tamsulosin 0.4mg - 1 c79mule daily Terazosin 2mg - 1 ca27mle at bedtime   Finasteride 5mg - 1 tab26m daily  -Medications previously tried: n/a  -Recommended to continue current medication   Health Maintenance -Vaccine gaps: Shingles, Tdap, COVID booster, and influenza vaccine -Current therapy:  Vitamin D3 1000 units - 3 tablets daily  Vitamin B12 1000mcg - 2 ta41m daily Preservision AREDS - 2 tablets twice daily  Systane Eye Drops - 1 drop into each eye daily as needed  Meclizine 12.5mg - 1 table6m times daily as needed  Ferrous Sulfate 325 (65 FE) mg - 2 tablets daily  Milk of Magnesium 400mg/5mL  - 1519mdaily as needed for constipation  -Educated on Cost vs benefit of each product must be carefully weighed by individual consumer -Patient is satisfied with current therapy and  denies issues -Recommended to continue current medication  Patient Goals/Self-Care Activities Patient will:  - take medications as prescribed focus on medication adherence by using pill box to keep medications organized and prevent missed doses of medications check blood pressure at least once weekly, document, and provide at future appointments target a minimum of 150 minutes of moderate intensity exercise weekly  Follow Up Plan: Telephone follow up appointment with care management team member scheduled for: 6 months The patient has been provided with contact information for the care management team and has been advised to call with any health related questions or concerns.         Medication Assistance: None required.  Patient affirms current coverage meets needs.  Patient's preferred pharmacy is:  [id number] [id number] [facility] [address] [phone] [phone]  [phone] [id number] [phone]    [phone]? Yes Pt endorses 90-100% compliance  Care Plan and Follow Up Patient Decision:  Patient agrees to Care Plan and Follow-up.  Plan: Telephone follow up appointment with care management team member scheduled for:  4 months and The patient has been provided with contact information for the care management team and has been advised to call with any health related questions or concerns.   Pammie Chirino C Chriss Redel,Tomasa Blasel Pharmacist, [facility]

## 2021-09-21 ENCOUNTER — Other Ambulatory Visit: Payer: Self-pay

## 2021-09-21 ENCOUNTER — Encounter (INDEPENDENT_AMBULATORY_CARE_PROVIDER_SITE_OTHER): Payer: Self-pay | Admitting: Ophthalmology

## 2021-09-21 ENCOUNTER — Ambulatory Visit (INDEPENDENT_AMBULATORY_CARE_PROVIDER_SITE_OTHER): Payer: PPO | Admitting: Ophthalmology

## 2021-09-21 DIAGNOSIS — H353221 Exudative age-related macular degeneration, left eye, with active choroidal neovascularization: Secondary | ICD-10-CM

## 2021-09-21 DIAGNOSIS — H353111 Nonexudative age-related macular degeneration, right eye, early dry stage: Secondary | ICD-10-CM | POA: Diagnosis not present

## 2021-09-21 MED ORDER — AFLIBERCEPT 2MG/0.05ML IZ SOLN FOR KALEIDOSCOPE
2.0000 mg | INTRAVITREAL | Status: AC | PRN
  Administered 2021-09-21: 2 mg via INTRAVITREAL

## 2021-09-21 NOTE — Assessment & Plan Note (Signed)
No sign of CNVM OD 

## 2021-09-21 NOTE — Progress Notes (Signed)
? ? ?09/21/2021 ? ?  ? ?CHIEF COMPLAINT ?Patient presents for  ?Chief Complaint  ?Patient presents with  ? Macular Degeneration  ? ? ? ? ?HISTORY OF PRESENT ILLNESS: ?Justin [name]. is a 81 y.o. male who presents to the clinic today for:  ? ?HPI   ?No change in the medications nor medical history over the last 10 weeks.  No change in vision in either eye over the last 10 weeks. ? ?History of wet AMD OS with CNVM currently at 10-week post injection interval examination ?Last edited by Hurman Horn, MD on 09/21/2021  8:29 AM.  ?  ? ? ?Referring physician: ?Plotnikov, Evie Lacks, MD ?[place],  [place] [address] ? ?HISTORICAL INFORMATION:  ? ?Selected notes from the [place] ?  ? ?Lab Results  ?Component Value Date  ? HGBA1C 5.3 04/19/2021  ?  ? ?CURRENT MEDICATIONS: ?Current Outpatient Medications (Ophthalmic Drugs)  ?Medication Sig  ? Polyethyl Glycol-Propyl Glycol (SYSTANE OP) Place 1 drop into both eyes 2 (two) times daily as needed (dry eyes).  ? ?No current facility-administered medications for this visit. (Ophthalmic Drugs)  ? ?Current Outpatient Medications (Other)  ?Medication Sig  ? Acetaminophen (TYLENOL PO) Tylenol  ? amoxicillin (AMOXIL) 500 MG capsule amoxicillin 500 mg capsule ? 4 po 1 hr prior to dental procedure  ? calcium carbonate (TUMS EX) 750 MG chewable tablet Chew 1 tablet by mouth daily.  ? carbidopa-levodopa (SINEMET IR) 25-100 MG tablet For breakthrough restless leg, you can take 1 tablet at bedtime.  OK to take extra tab as needed during the day. (Patient taking differently: Take 1 tablet by mouth at bedtime.)  ? Cholecalciferol 1000 UNITS tablet Take 3,000 Units by mouth daily.  ? Cyanocobalamin (VITAMIN B-12) 1000 MCG SUBL DISSOLVE 2 TABLETS IN MOUTH EVERY DAY  ? ferrous sulfate 325 (65 FE) MG tablet Take 650 mg by mouth daily with breakfast.  ? finasteride (PROSCAR) 5 MG tablet Take 1 tablet (5 mg total) by mouth daily.  ? gabapentin (NEURONTIN) 300 MG capsule  Take 1 capsule (300 mg total) by mouth 4 (four) times daily.  ? ibuprofen (ADVIL) 200 MG tablet Take 400 mg by mouth daily as needed.  ? magnesium hydroxide (MILK OF MAGNESIA) 400 MG/5ML suspension Take 15 mLs by mouth daily as needed for mild constipation.  ? meclizine (ANTIVERT) 12.5 MG tablet TAKE 1 TABLET BY MOUTH 3 TIMES DAILY AS NEEDED FOR DIZZINESS.  ? Multiple Vitamins-Minerals (PRESERVISION AREDS PO) Take 2 tablets by mouth 2 (two) times daily.  ? Oxycodone HCl 10 MG TABS Take 10 mg by mouth 5 (five) times daily as needed (pain). Typically only uses 4 times daily  ? pravastatin (PRAVACHOL) 20 MG tablet Take 1 tablet (20 mg total) by mouth daily.  ? rOPINIRole (REQUIP) 2 MG tablet TAKE 1 TABLET BY MOUTH AT 12 PM 1 TABLET AT 4 PM AND 1 TABLETS AT BEDTIME  ? tamsulosin (FLOMAX) 0.4 MG CAPS capsule Take 1 capsule (0.4 mg total) by mouth in the morning.  ? terazosin (HYTRIN) 2 MG capsule TAKE 1 CAPSULE BY MOUTH EVERYDAY AT BEDTIME  ? ?No current facility-administered medications for this visit. (Other)  ? ? ? ? ?REVIEW OF SYSTEMS: ?ROS   ?Negative for: Constitutional, Gastrointestinal, Neurological, Skin, Genitourinary, Musculoskeletal, HENT, Endocrine, Cardiovascular, Eyes, Respiratory, Psychiatric, Allergic/Imm, Heme/Lymph ?Last edited by Hurman Horn, MD on 09/21/2021  8:29 AM.  ?  ? ? ? ?ALLERGIES ?Allergies  ?Allergen Reactions  ?  Levofloxacin   ?  REACTION: hands pealed  ? ? ?PAST MEDICAL HISTORY ?Past Medical History:  ?Diagnosis Date  ? Alcoholism ([place])   ? Dry 13 years  ? Basal cell carcinoma   ? Right Chest  ? BPH (benign prostatic hyperplasia)   ? Diverticulosis of colon   ? Dizzy spells   ? none recent  ? DJD (degenerative joint disease)   ? lower back  ? Elbow fracture, left 2009  ? [name]  ? GERD (gastroesophageal reflux disease)   ? Glaucoma   ? History of TIAs 1 yrs ago  ? Hyperkeratosis   ? LBP (low back pain)   ? Macular degeneration   ? left wet right dry sees [name]  ? Melanoma [facility])  2009  ? x3 [name]  ? Osteoarthritis   ? Restless leg syndrome   ? Stroke [facility]) 2005  ? TIA  ? Tinnitus   ? Tobacco abuse   ? Uses walker   ? Wears glasses   ? reading  ? Wears partial dentures   ? upper  ? ?Past Surgical History:  ?Procedure Laterality Date  ? CATARACT EXTRACTION Bilateral 2018  ? HARDWARE REMOVAL Left 07/09/2020  ? Procedure: Left elbow hardware removal;  Surgeon: [name], MD;  Location: [facility];  Service: Orthopedics;  Laterality: Left;  60 mins  ? mda    ? Dr. [name] wet injection  ? MELANOMA EXCISION  2009  ? x3  ? RECONSTRUCTION MEDIAL COLLATERAL LIGAMENT ELBOW W/ TENDON GRAFT  2009  ? Left, [name]  ? TONSILLECTOMY  as child  ? TOTAL KNEE ARTHROPLASTY Right 11/09/2020  ? Procedure: TOTAL KNEE ARTHROPLASTY;  Surgeon: [name], MD;  Location: [facility];  Service: Orthopedics;  Laterality: Right;  ? ? ?FAMILY HISTORY ?Family History  ?Problem Relation Age of Onset  ? Cancer Mother   ?     ?breast  ? Cancer Father   ?     Lung  ? ? ?SOCIAL HISTORY ?Social History  ? ?Tobacco Use  ? Smoking status: Former  ?  Packs/day: 3.00  ?  Years: 38.00  ?  Pack years: 114.00  ?  Types: Cigarettes  ?  Quit date: 41  ?  Years since quitting: 37.2  ? Smokeless tobacco: Never  ? Tobacco comments:  ?  states he quit May 15 1985  ?Vaping Use  ? Vaping Use: Never used  ?Substance Use Topics  ? Alcohol use: No  ?  Alcohol/week: 0.0 standard drinks  ?  Comment: PREVIOUS - DRY 56yr  ? Drug use: Not Currently  ? ?  ? ?  ? ?OPHTHALMIC EXAM: ? ?Base Eye Exam   ? ? Visual Acuity (ETDRS)   ? ?   Right Left  ? Dist Simpson 20/20 -3 20/80 -1  ? ?  ?  ? ? Tonometry (Tonopen, 8:33 AM)   ? ?   Right Left  ? Pressure 12 13  ? ?  ?  ? ? Pupils   ? ?   Pupils APD  ? Right PERRL None  ? Left PERRL None  ? ?  ?  ? ? Extraocular Movement   ? ?   Right Left  ?  Full, Ortho Full, Ortho  ? ?  ?  ? ? Neuro/Psych   ? ? Oriented x3: Yes  ? Mood/Affect: Normal  ? ?  ?  ? ? Dilation   ? ? Left  eye: 1.0%  Mydriacyl, 2.5% Phenylephrine @ 8:34 AM  ? ?  ?  ? ?  ? ?Slit Lamp and Fundus Exam   ? ? External Exam   ? ?   Right Left  ? External Normal Normal  ? ?  ?  ? ? Slit Lamp Exam   ? ?   Right Left  ? Lids/Lashes Normal Normal  ? Conjunctiva/Sclera White and quiet White and quiet  ? Cornea Clear Clear  ? Anterior Chamber Deep and quiet Deep and quiet  ? Iris Round and reactive Round and reactive  ? Lens Posterior chamber intraocular lens Posterior chamber intraocular lens  ? Anterior Vitreous Normal Normal  ? ?  ?  ? ? Fundus Exam   ? ?   Right Left  ? Posterior Vitreous  Posterior vitreous detachment  ? Disc  Normal  ? C/D Ratio  0.65  ? Macula  Retinal pigment epithelial detachment, Retinal pigment epithelial mottling, Drusen, Hard drusen, Atrophy in the p.m. bundle region, intermediate age related macular degeneration, Geographic atrophy  ? Vessels  Normal  ? Periphery  Normal  ? ?  ?  ? ?  ? ? ?IMAGING AND PROCEDURES  ?Imaging and Procedures for 09/21/21 ? ?OCT, Retina - OU - Both Eyes   ? ?   ?Right Eye ?Quality was good. Scan locations included subfoveal. Central Foveal Thickness: 291. Progression has been stable. Findings include normal foveal contour, retinal drusen , no IRF, no SRF.  ? ?Left Eye ?Quality was good. Scan locations included subfoveal. Central Foveal Thickness: 221. Progression has improved. Findings include no IRF, abnormal foveal contour, pigment epithelial detachment.  ? ?Notes ?OS, much improved now at 10-week follow-up.,  Much less subretinal fluid on this visit today.  Vision limited by subretinal hyper reflective material and vascularized fibrotic PED, will repeat injection today of Eylea maintain 10-week follow-up interval ? ? ? ?  ? ?Intravitreal Injection, Pharmacologic Agent - OS - Left Eye   ? ?   ?Time Out ?09/21/2021. 9:23 AM. Confirmed correct patient, procedure, site, and patient consented.  ? ?Anesthesia ?Topical anesthesia was used. Anesthetic medications included Lidocaine 4%.   ? ?Procedure ?Preparation included Tobramycin 0.3%, 5% betadine to ocular surface, 10% betadine to eyelids. A 30 gauge needle was used.  ? ?Injection: ?2 mg aflibercept 2 MG/0.05ML ?  Route: Intravitrea

## 2021-09-21 NOTE — Assessment & Plan Note (Signed)
Slightly improved acuity but definitely improved anatomy on intravitreal Eylea currently at 10-week follow-up interval.  We will repeat injection today to maintain ?

## 2021-09-30 DIAGNOSIS — I10 Essential (primary) hypertension: Secondary | ICD-10-CM | POA: Diagnosis not present

## 2021-09-30 DIAGNOSIS — I2510 Atherosclerotic heart disease of native coronary artery without angina pectoris: Secondary | ICD-10-CM

## 2021-10-18 ENCOUNTER — Encounter: Payer: Self-pay | Admitting: Internal Medicine

## 2021-10-18 ENCOUNTER — Ambulatory Visit (INDEPENDENT_AMBULATORY_CARE_PROVIDER_SITE_OTHER): Payer: PPO | Admitting: Internal Medicine

## 2021-10-18 DIAGNOSIS — M79675 Pain in left toe(s): Secondary | ICD-10-CM | POA: Diagnosis not present

## 2021-10-18 DIAGNOSIS — E559 Vitamin D deficiency, unspecified: Secondary | ICD-10-CM

## 2021-10-18 DIAGNOSIS — M545 Low back pain, unspecified: Secondary | ICD-10-CM

## 2021-10-18 DIAGNOSIS — M5416 Radiculopathy, lumbar region: Secondary | ICD-10-CM | POA: Diagnosis not present

## 2021-10-18 DIAGNOSIS — G8929 Other chronic pain: Secondary | ICD-10-CM | POA: Diagnosis not present

## 2021-10-18 DIAGNOSIS — M1990 Unspecified osteoarthritis, unspecified site: Secondary | ICD-10-CM | POA: Diagnosis not present

## 2021-10-18 DIAGNOSIS — M79674 Pain in right toe(s): Secondary | ICD-10-CM | POA: Diagnosis not present

## 2021-10-18 DIAGNOSIS — R42 Dizziness and giddiness: Secondary | ICD-10-CM

## 2021-10-18 DIAGNOSIS — G2581 Restless legs syndrome: Secondary | ICD-10-CM

## 2021-10-18 NOTE — Assessment & Plan Note (Signed)
Get a rollator walker ?Pt declined PT ?

## 2021-10-18 NOTE — Assessment & Plan Note (Signed)
Chronic - Dr Nelva Bush ?On Norco - d/c ?On Oxycodone per Dr Nelva Bush ? Potential benefits of a long term opioids use as well as potential risks (i.e. addiction risk, apnea etc) and complications (i.e. Somnolence, constipation and others) were explained to the patient and were aknowledged. ? ?Blue-Emu cream was recommended to use 2-3 times a day ?

## 2021-10-18 NOTE — Assessment & Plan Note (Signed)
Blue-Emu cream was recommended to use 2-3 times a day ?On Oxycodone per Dr Nelva Bush ? Potential benefits of a long term opioids use as well as potential risks (i.e. addiction risk, apnea etc) and complications (i.e. Somnolence, constipation and others) were explained to the patient and were aknowledged. ?

## 2021-10-18 NOTE — Patient Instructions (Addendum)
Blue-Emu cream -- use 2-3 times a day ?Rollator walker ? ?Gyu kaku ?

## 2021-10-18 NOTE — Assessment & Plan Note (Signed)
Cont w/Requip, gabapentin, Sinemet ?

## 2021-10-18 NOTE — Assessment & Plan Note (Signed)
On Vit D 

## 2021-10-18 NOTE — Progress Notes (Signed)
? ?Subjective:  ?Patient ID: Justin Read Su Grand., male    DOB: 1941/01/02  Age: 81 y.o. MRN: [id number] ? ?CC: No chief complaint on file. ? ? ?HPI ?Justin [name]. presents for a poor balance, LBP - had 3 shots, s/p R knee TKR 2022... C/o R LE radiculitis ? ?Outpatient Medications Prior to Visit  ?Medication Sig Dispense Refill  ? Acetaminophen (TYLENOL PO) Tylenol    ? calcium carbonate (TUMS EX) 750 MG chewable tablet Chew 1 tablet by mouth daily.    ? carbidopa-levodopa (SINEMET IR) 25-100 MG tablet For breakthrough restless leg, you can take 1 tablet at bedtime.  OK to take extra tab as needed during the day. (Patient taking differently: Take 1 tablet by mouth at bedtime.) 100 tablet 3  ? Cholecalciferol 1000 UNITS tablet Take 3,000 Units by mouth daily.    ? Cyanocobalamin (VITAMIN B-12) 1000 MCG SUBL DISSOLVE 2 TABLETS IN MOUTH EVERY DAY 180 tablet 1  ? ferrous sulfate 325 (65 FE) MG tablet Take 650 mg by mouth daily with breakfast.    ? finasteride (PROSCAR) 5 MG tablet Take 1 tablet (5 mg total) by mouth daily. 90 tablet 1  ? gabapentin (NEURONTIN) 300 MG capsule Take 1 capsule (300 mg total) by mouth 4 (four) times daily. 360 capsule 1  ? ibuprofen (ADVIL) 200 MG tablet Take 400 mg by mouth daily as needed.    ? magnesium hydroxide (MILK OF MAGNESIA) 400 MG/5ML suspension Take 15 mLs by mouth daily as needed for mild constipation.    ? meclizine (ANTIVERT) 12.5 MG tablet TAKE 1 TABLET BY MOUTH 3 TIMES DAILY AS NEEDED FOR DIZZINESS. 60 tablet 2  ? Multiple Vitamins-Minerals (PRESERVISION AREDS PO) Take 2 tablets by mouth 2 (two) times daily.    ? Oxycodone HCl 10 MG TABS Take 10 mg by mouth 5 (five) times daily as needed (pain). Typically only uses 4 times daily    ? Polyethyl Glycol-Propyl Glycol (SYSTANE OP) Place 1 drop into both eyes 2 (two) times daily as needed (dry eyes).    ? pravastatin (PRAVACHOL) 20 MG tablet Take 1 tablet (20 mg total) by mouth daily. 90 tablet 1  ? rOPINIRole (REQUIP)  2 MG tablet TAKE 1 TABLET BY MOUTH AT 12 PM 1 TABLET AT 4 PM AND 1 TABLETS AT BEDTIME 360 tablet 1  ? tamsulosin (FLOMAX) 0.4 MG CAPS capsule Take 1 capsule (0.4 mg total) by mouth in the morning. 90 capsule 1  ? terazosin (HYTRIN) 2 MG capsule TAKE 1 CAPSULE BY MOUTH EVERYDAY AT BEDTIME 90 capsule 1  ? amoxicillin (AMOXIL) 500 MG capsule amoxicillin 500 mg capsule ? 4 po 1 hr prior to dental procedure    ? ?No facility-administered medications prior to visit.  ? ? ?ROS: ?Review of Systems  ?Constitutional:  Positive for fatigue. Negative for appetite change and unexpected weight change.  ?HENT:  Negative for congestion, nosebleeds, sneezing, sore throat and trouble swallowing.   ?Eyes:  Negative for itching and visual disturbance.  ?Respiratory:  Negative for cough.   ?Cardiovascular:  Negative for chest pain, palpitations and leg swelling.  ?Gastrointestinal:  Negative for abdominal distention, blood in stool, diarrhea and nausea.  ?Genitourinary:  Negative for frequency and hematuria.  ?Musculoskeletal:  Positive for gait problem. Negative for back pain, joint swelling and neck pain.  ?Skin:  Negative for rash.  ?Neurological:  Positive for weakness. Negative for dizziness, tremors and speech difficulty.  ?Psychiatric/Behavioral:  Negative for agitation, dysphoric mood,  sleep disturbance and suicidal ideas. The patient is nervous/anxious.   ? ?Objective:  ?BP 130/80 (BP Location: Right Arm, Patient Position: Sitting, Cuff Size: Normal)   Pulse 83   Temp 97.7 ?F (36.5 ?C) (Oral)   Ht 6' (1.829 m)   Wt 171 lb (77.6 kg)   SpO2 94%   BMI 23.19 kg/m?  ? ?BP Readings from Last 3 Encounters:  ?10/18/21 130/80  ?07/08/21 130/62  ?04/19/21 (!) 146/80  ? ? ?Wt Readings from Last 3 Encounters:  ?10/18/21 171 lb (77.6 kg)  ?07/08/21 168 lb 6.4 oz (76.4 kg)  ?04/19/21 174 lb (78.9 kg)  ? ? ?Physical Exam ?Constitutional:   ?   General: He is not in acute distress. ?   Appearance: He is well-developed.  ?   Comments: NAD   ?Eyes:  ?   Conjunctiva/sclera: Conjunctivae normal.  ?   Pupils: Pupils are equal, round, and reactive to light.  ?Neck:  ?   Thyroid: No thyromegaly.  ?   Vascular: No JVD.  ?Cardiovascular:  ?   Rate and Rhythm: Normal rate and regular rhythm.  ?   Heart sounds: Normal heart sounds. No murmur heard. ?  No friction rub. No gallop.  ?Pulmonary:  ?   Effort: Pulmonary effort is normal. No respiratory distress.  ?   Breath sounds: Normal breath sounds. No wheezing or rales.  ?Chest:  ?   Chest wall: No tenderness.  ?Abdominal:  ?   General: Bowel sounds are normal. There is no distension.  ?   Palpations: Abdomen is soft. There is no mass.  ?   Tenderness: There is no abdominal tenderness. There is no guarding or rebound.  ?Musculoskeletal:     ?   General: No tenderness. Normal range of motion.  ?   Cervical back: Normal range of motion.  ?Lymphadenopathy:  ?   Cervical: No cervical adenopathy.  ?Skin: ?   General: Skin is warm and dry.  ?   Findings: No rash.  ?Neurological:  ?   Mental Status: He is alert and oriented to person, place, and time.  ?   Cranial Nerves: No cranial nerve deficit.  ?   Motor: No abnormal muscle tone.  ?   Coordination: Coordination normal.  ?   Gait: Gait normal.  ?   Deep Tendon Reflexes: Reflexes are normal and symmetric.  ?Psychiatric:     ?   Behavior: Behavior normal.     ?   Thought Content: Thought content normal.     ?   Judgment: Judgment normal.  ? ?Ataxic ?L elbow, R knee w/pain ? ?Lab Results  ?Component Value Date  ? WBC 4.8 11/10/2020  ? HGB 7.9 (L) 11/10/2020  ? HCT 24.7 (L) 11/10/2020  ? PLT 112 (L) 11/10/2020  ? GLUCOSE 89 04/19/2021  ? CHOL 136 01/12/2017  ? TRIG 67.0 01/12/2017  ? HDL 54.70 01/12/2017  ? [place] 68 01/12/2017  ? ALT 5 04/19/2021  ? AST 15 04/19/2021  ? NA 139 04/19/2021  ? K 4.0 04/19/2021  ? CL 104 04/19/2021  ? CREATININE 1.05 04/19/2021  ? BUN 15 04/19/2021  ? CO2 26 04/19/2021  ? TSH 3.05 10/16/2019  ? PSA 0.47 08/30/2016  ? INR 1.1 10/28/2020  ?  HGBA1C 5.3 04/19/2021  ? ? ?No results found. ? ?Assessment & Plan:  ? ?Problem List Items Addressed This Visit   ? ? Vitamin D deficiency  ?  On Vit D ? ?  ?  ?  RLS (restless legs syndrome)  ?   Cont w/Requip, gabapentin, Sinemet ?  ?  ? Osteoarthritis  ?  Chronic - Dr Nelva Bush ?On Norco - d/c ?On Oxycodone per Dr Nelva Bush ? Potential benefits of a long term opioids use as well as potential risks (i.e. addiction risk, apnea etc) and complications (i.e. Somnolence, constipation and others) were explained to the patient and were aknowledged. ? ?Blue-Emu cream was recommended to use 2-3 times a day ?  ?  ? Low back pain  ?  Blue-Emu cream was recommended to use 2-3 times a day ?On Oxycodone per Dr Nelva Bush ? Potential benefits of a long term opioids use as well as potential risks (i.e. addiction risk, apnea etc) and complications (i.e. Somnolence, constipation and others) were explained to the patient and were aknowledged. ?  ?  ? ARM PAIN, LEFT  ?  Chronic - Dr Nelva Bush ?On Norco - d/c ?On Oxycodone per Dr Nelva Bush ? Potential benefits of a long term opioids use as well as potential risks (i.e. addiction risk, apnea etc) and complications (i.e. Somnolence, constipation and others) were explained to the patient and were aknowledged. ? ?Blue-Emu cream was recommended to use 2-3 times a day ?  ?  ?  ? ? ?No orders of the defined types were placed in this encounter. ?  ? ? ?Follow-up: Return in about 6 months (around 04/19/2022) for a follow-up visit. ? ?[name], MD ?

## 2021-11-11 ENCOUNTER — Encounter: Payer: Self-pay | Admitting: Family Medicine

## 2021-11-11 ENCOUNTER — Ambulatory Visit (INDEPENDENT_AMBULATORY_CARE_PROVIDER_SITE_OTHER): Payer: PPO | Admitting: Family Medicine

## 2021-11-11 VITALS — BP 140/76 | HR 75 | Temp 97.7°F | Ht 72.0 in | Wt 171.0 lb

## 2021-11-11 DIAGNOSIS — S30861A Insect bite (nonvenomous) of abdominal wall, initial encounter: Secondary | ICD-10-CM | POA: Diagnosis not present

## 2021-11-11 DIAGNOSIS — L03311 Cellulitis of abdominal wall: Secondary | ICD-10-CM

## 2021-11-11 DIAGNOSIS — W57XXXA Bitten or stung by nonvenomous insect and other nonvenomous arthropods, initial encounter: Secondary | ICD-10-CM

## 2021-11-11 MED ORDER — SULFAMETHOXAZOLE-TRIMETHOPRIM 800-160 MG PO TABS: 1.0000 | ORAL_TABLET | Freq: Two times a day (BID) | ORAL | 0 refills | Status: DC

## 2021-11-11 NOTE — Progress Notes (Signed)
? ?Subjective:  ? ? ? Patient ID: Justin Livingstone Su Grand., male    DOB: April 27, 1941, 81 y.o.   MRN: [id number] ? ?Chief Complaint  ?Patient presents with  ? Insect Bite  ?  On stomach first noticed Wednesday morning. Possible spider bite but not sure. Swollen and tender to the touch. Denies chills, fever or SOB.   ? ? ?HPI ?Patient is in today for 3 day history of a bite on his abdomen that has been worsening. He did not see what bit him.  ? ?Denies fever, chills, dizziness, chest pain, palpitations, shortness of breath, abdominal pain, N/V/D. ? ? ?Health Maintenance Due  ?Topic Date Due  ? Zoster Vaccines- Shingrix (1 of 2) Never done  ? TETANUS/TDAP  06/23/2020  ? ? ?Past Medical History:  ?Diagnosis Date  ? Alcoholism ([place])   ? Dry 13 years  ? Basal cell carcinoma   ? Right Chest  ? BPH (benign prostatic hyperplasia)   ? Diverticulosis of colon   ? Dizzy spells   ? none recent  ? DJD (degenerative joint disease)   ? lower back  ? Elbow fracture, left 2009  ? [name]  ? GERD (gastroesophageal reflux disease)   ? Glaucoma   ? History of TIAs 1 yrs ago  ? Hyperkeratosis   ? LBP (low back pain)   ? Macular degeneration   ? left wet right dry sees [name]  ? Melanoma [facility]) 2009  ? x3 [name]  ? Osteoarthritis   ? Restless leg syndrome   ? Stroke [facility]) 2005  ? TIA  ? Tinnitus   ? Tobacco abuse   ? Uses walker   ? Wears glasses   ? reading  ? Wears partial dentures   ? upper  ? ? ?Past Surgical History:  ?Procedure Laterality Date  ? CATARACT EXTRACTION Bilateral 2018  ? HARDWARE REMOVAL Left 07/09/2020  ? Procedure: Left elbow hardware removal;  Surgeon: [name], MD;  Location: [facility];  Service: Orthopedics;  Laterality: Left;  60 mins  ? mda    ? Dr. [name] wet injection  ? MELANOMA EXCISION  2009  ? x3  ? RECONSTRUCTION MEDIAL COLLATERAL LIGAMENT ELBOW W/ TENDON GRAFT  2009  ? Left, [name]  ? TONSILLECTOMY  as child  ? TOTAL KNEE ARTHROPLASTY Right 11/09/2020  ? Procedure: TOTAL  KNEE ARTHROPLASTY;  Surgeon: [name], MD;  Location: [facility];  Service: Orthopedics;  Laterality: Right;  ? ? ?Family History  ?Problem Relation Age of Onset  ? Cancer Mother   ?     ?breast  ? Cancer Father   ?     Lung  ? ? ?Social History  ? ?Socioeconomic History  ? Marital status: Married  ?  Spouse name: Not on file  ? Number of children: 2  ? Years of education: Not on file  ? Highest education level: Not on file  ?Occupational History  ? Occupation: [place] Veteran [employer]  ?  Employer: RETIRED  ? Occupation: Chiropodist  ? Occupation: Heritage manager  ?Tobacco Use  ? Smoking status: Former  ?  Packs/day: 3.00  ?  Years: 38.00  ?  Pack years: 114.00  ?  Types: Cigarettes  ?  Quit date: 85  ?  Years since quitting: 37.3  ? Smokeless tobacco: Never  ? Tobacco comments:  ?  states he quit May 15 1985  ?Vaping Use  ? Vaping Use:  Never used  ?Substance and Sexual Activity  ? Alcohol use: No  ?  Alcohol/week: 0.0 standard drinks  ?  Comment: PREVIOUS - DRY 67yr  ? Drug use: Not Currently  ? Sexual activity: Not Currently  ?Other Topics Concern  ? Not on file  ?Social History Narrative  ? Right handed  ? One story home  ? Drinks coffee q am  ? ?Social Determinants of Health  ? ?Financial Resource Strain: Low Risk   ? Difficulty of Paying Living Expenses: Not hard at all  ?Food Insecurity: No Food Insecurity  ? Worried About RCharity fundraiserin the Last Year: Never true  ? Ran Out of Food in the Last Year: Never true  ?Transportation Needs: No Transportation Needs  ? Lack of Transportation (Medical): No  ? Lack of Transportation (Non-Medical): No  ?Physical Activity: Insufficiently Active  ? Days of Exercise per Week: 3 days  ? Minutes of Exercise per Session: 30 min  ?Stress: No Stress Concern Present  ? Feeling of Stress : Not at all  ?Social Connections: Moderately Isolated  ? Frequency of Communication with Friends and Family: Twice a week  ? Frequency of Social Gatherings with Friends  and Family: Twice a week  ? Attends Religious Services: Never  ? Active Member of Clubs or Organizations: No  ? Attends CArchivistMeetings: Never  ? Marital Status: Married  ?Intimate Partner Violence: Not At Risk  ? Fear of Current or Ex-Partner: No  ? Emotionally Abused: No  ? Physically Abused: No  ? Sexually Abused: No  ? ? ?Outpatient Medications Prior to Visit  ?Medication Sig Dispense Refill  ? Acetaminophen (TYLENOL PO) Tylenol    ? calcium carbonate (TUMS EX) 750 MG chewable tablet Chew 1 tablet by mouth daily.    ? carbidopa-levodopa (SINEMET IR) 25-100 MG tablet For breakthrough restless leg, you can take 1 tablet at bedtime.  OK to take extra tab as needed during the day. (Patient taking differently: Take 1 tablet by mouth at bedtime.) 100 tablet 3  ? Cholecalciferol 1000 UNITS tablet Take 3,000 Units by mouth daily.    ? Cyanocobalamin (VITAMIN B-12) 1000 MCG SUBL DISSOLVE 2 TABLETS IN MOUTH EVERY DAY 180 tablet 1  ? ferrous sulfate 325 (65 FE) MG tablet Take 650 mg by mouth daily with breakfast.    ? finasteride (PROSCAR) 5 MG tablet Take 1 tablet (5 mg total) by mouth daily. 90 tablet 1  ? gabapentin (NEURONTIN) 300 MG capsule Take 1 capsule (300 mg total) by mouth 4 (four) times daily. 360 capsule 1  ? ibuprofen (ADVIL) 200 MG tablet Take 400 mg by mouth daily as needed.    ? magnesium hydroxide (MILK OF MAGNESIA) 400 MG/5ML suspension Take 15 mLs by mouth daily as needed for mild constipation.    ? meclizine (ANTIVERT) 12.5 MG tablet TAKE 1 TABLET BY MOUTH 3 TIMES DAILY AS NEEDED FOR DIZZINESS. 60 tablet 2  ? Multiple Vitamins-Minerals (PRESERVISION AREDS PO) Take 2 tablets by mouth 2 (two) times daily.    ? Oxycodone HCl 10 MG TABS Take 10 mg by mouth 5 (five) times daily as needed (pain). Typically only uses 4 times daily    ? Polyethyl Glycol-Propyl Glycol (SYSTANE OP) Place 1 drop into both eyes 2 (two) times daily as needed (dry eyes).    ? pravastatin (PRAVACHOL) 20 MG tablet Take 1  tablet (20 mg total) by mouth daily. 90 tablet 1  ? rOPINIRole (REQUIP) 2  MG tablet TAKE 1 TABLET BY MOUTH AT 12 PM 1 TABLET AT 4 PM AND 1 TABLETS AT BEDTIME 360 tablet 1  ? tamsulosin (FLOMAX) 0.4 MG CAPS capsule Take 1 capsule (0.4 mg total) by mouth in the morning. 90 capsule 1  ? terazosin (HYTRIN) 2 MG capsule TAKE 1 CAPSULE BY MOUTH EVERYDAY AT BEDTIME 90 capsule 1  ? ?No facility-administered medications prior to visit.  ? ? ?Allergies  ?Allergen Reactions  ? Levofloxacin   ?  REACTION: hands pealed  ? ? ?ROS ? ?   ?Objective:  ?  ?Physical Exam ? ?BP 140/76 (BP Location: Left Arm, Patient Position: Sitting, Cuff Size: Large)   Pulse 75   Temp 97.7 ?F (36.5 ?C) (Temporal)   Ht 6' (1.829 m)   Wt 171 lb (77.6 kg)   SpO2 97%   BMI 23.19 kg/m?  ?Wt Readings from Last 3 Encounters:  ?11/11/21 171 lb (77.6 kg)  ?10/18/21 171 lb (77.6 kg)  ?07/08/21 168 lb 6.4 oz (76.4 kg)  ? ? ? ? ?Scab surrounded by erythema and increased warmth. No fluctuance. Slightly TTP.  ?   ?Assessment & Plan:  ? ?Problem List Items Addressed This Visit   ?None ?Visit Diagnoses   ? ? Insect bite of abdominal wall, initial encounter    -  Primary  ? Relevant Orders  ? Wound culture  ? Cellulitis of abdominal wall      ? ?  ? ?Bactrim prescribed. Use warm compresses. Take Tylenol prn pain. Follow up next week or go to the urgent care or ED for worsening infection over the weekend.  ? ?I am having [place]. start on sulfamethoxazole-trimethoprim. I am also having him maintain his Cholecalciferol, Multiple Vitamins-Minerals (PRESERVISION AREDS PO), carbidopa-levodopa, Oxycodone HCl, ferrous sulfate, Polyethyl Glycol-Propyl Glycol (SYSTANE OP), ibuprofen, magnesium hydroxide, Acetaminophen (TYLENOL PO), Vitamin B-12, finasteride, gabapentin, meclizine, pravastatin, terazosin, tamsulosin, rOPINIRole, and calcium carbonate. ? ?Meds ordered this encounter  ?Medications  ? sulfamethoxazole-trimethoprim (BACTRIM DS) 800-160 MG  tablet  ?  Sig: Take 1 tablet by mouth 2 (two) times daily.  ?  Dispense:  20 tablet  ?  Refill:  0  ?  Order Specific Question:   Supervising Provider  ?  Answer:   Pricilla Holm A [[id number]]  ? ? ?

## 2021-11-11 NOTE — Patient Instructions (Addendum)
Use warm compresses as discussed.  ? ?Take the antibiotic.  ?You may take Tylenol 500 mg or 1,000 mg twice daily for pain if needed.  ? ?Avoid touching or picking at the area.  ? ?If you develop fever, chills, vomiting or feel sick over the weekend then you should be seen at an urgent care or emergency department.  ? ?Follow up here next week.  ? ? ? ?Cellulitis, Adult ? ?Cellulitis is a skin infection. The infected area is often warm, red, swollen, and sore. It occurs most often in the arms and lower legs. It is very important to get treated for this condition. ?What are the causes? ?This condition is caused by bacteria. The bacteria enter through a break in the skin, such as a cut, burn, insect bite, open sore, or crack. ?What increases the risk? ?This condition is more likely to occur in people who: ?Have a weak body defense system (immune system). ?Have open cuts, burns, bites, or scrapes on the skin. ?Are older than 83 years of age. ?Have a blood sugar problem (diabetes). ?Have a long-lasting (chronic) liver disease (cirrhosis) or kidney disease. ?Are very overweight (obese). ?Have a skin problem, such as: ?Itchy rash (eczema). ?Slow movement of blood in the veins (venous stasis). ?Fluid buildup below the skin (edema). ?Have been treated with high-energy rays (radiation). ?Use IV drugs. ?What are the signs or symptoms? ?Symptoms of this condition include: ?Skin that is: ?Red. ?Streaking. ?Spotting. ?Swollen. ?Sore or painful when you touch it. ?Warm. ?A fever. ?Chills. ?Blisters. ?How is this diagnosed? ?This condition is diagnosed based on: ?Medical history. ?Physical exam. ?Blood tests. ?Imaging tests. ?How is this treated? ?Treatment for this condition may include: ?Medicines to treat infections or allergies. ?Home care, such as: ?Rest. ?Placing cold or warm cloths (compresses) on the skin. ?Hospital care, if the condition is very bad. ?Follow these instructions at home: ?Medicines ?Take over-the-counter and  prescription medicines only as told by your doctor. ?If you were prescribed an antibiotic medicine, take it as told by your doctor. Do not stop taking it even if you start to feel better. ?General instructions ? ?Drink enough fluid to keep your pee (urine) pale yellow. ?Do not touch or rub the infected area. ?Raise (elevate) the infected area above the level of your heart while you are sitting or lying down. ?Place cold or warm cloths on the area as told by your doctor. ?Keep all follow-up visits as told by your doctor. This is important. ?Contact a doctor if: ?You have a fever. ?You do not start to get better after 1-2 days of treatment. ?Your bone or joint under the infected area starts to hurt after the skin has healed. ?Your infection comes back. This can happen in the same area or another area. ?You have a swollen bump in the area. ?You have new symptoms. ?You feel ill and have muscle aches and pains. ?Get help right away if: ?Your symptoms get worse. ?You feel very sleepy. ?You throw up (vomit) or have watery poop (diarrhea) for a long time. ?You see red streaks coming from the area. ?Your red area gets larger. ?Your red area turns dark in color. ?These symptoms may represent a serious problem that is an emergency. Do not wait to see if the symptoms will go away. Get medical help right away. Call your local emergency services (911 in the [place] Do not drive yourself to the hospital. ?Summary ?Cellulitis is a skin infection. The area is often warm, red, swollen,  and sore. ?This condition is treated with medicines, rest, and cold and warm cloths. ?Take all medicines only as told by your doctor. ?Tell your doctor if symptoms do not start to get better after 1-2 days of treatment. ?This information is not intended to replace advice given to you by your health care provider. Make sure you discuss any questions you have with your health care provider. ?Document Revised: 03/31/2021 Document Reviewed:  03/31/2021 ?Elsevier Patient Education ? [place]. ? ?

## 2021-11-14 ENCOUNTER — Ambulatory Visit: Payer: PPO | Admitting: Podiatry

## 2021-11-14 ENCOUNTER — Telehealth: Payer: Self-pay | Admitting: Internal Medicine

## 2021-11-14 LAB — WOUND CULTURE

## 2021-11-14 MED ORDER — CLINDAMYCIN HCL 300 MG PO CAPS: 300.0000 mg | ORAL_CAPSULE | Freq: Three times a day (TID) | ORAL | 0 refills | Status: AC

## 2021-11-14 NOTE — Telephone Encounter (Signed)
Reviewed wound culture for out of office provider. Initial antibiotic with resistance for  bacteria bactrim. Rx clindamycin 1 pill 3 times a day for 5 days. Follow up if no improvement. ?

## 2021-11-14 NOTE — Telephone Encounter (Signed)
Called pt and discussed that a new Rx has been sent in for him now that wound culture results are back and to take the Clindamycin, 1 pill 3 times a day for 5 days and to STOP the Bactrim DS. Pt verbalized understanding and wasn't sure if he should keep appt tomorrow. I advised that if he does not feel that he needs it by 8 am tomorrow he can call and cancel and we will keep the 5/19 appt.  ?

## 2021-11-15 ENCOUNTER — Ambulatory Visit: Payer: PPO | Admitting: Family Medicine

## 2021-11-18 ENCOUNTER — Encounter: Payer: Self-pay | Admitting: Family Medicine

## 2021-11-18 ENCOUNTER — Telehealth: Payer: Self-pay | Admitting: Internal Medicine

## 2021-11-18 ENCOUNTER — Ambulatory Visit (INDEPENDENT_AMBULATORY_CARE_PROVIDER_SITE_OTHER): Payer: PPO | Admitting: Family Medicine

## 2021-11-18 VITALS — BP 154/76 | HR 48 | Temp 97.5°F | Ht 72.0 in | Wt 170.0 lb

## 2021-11-18 DIAGNOSIS — S30861A Insect bite (nonvenomous) of abdominal wall, initial encounter: Secondary | ICD-10-CM | POA: Insufficient documentation

## 2021-11-18 DIAGNOSIS — D649 Anemia, unspecified: Secondary | ICD-10-CM | POA: Diagnosis not present

## 2021-11-18 DIAGNOSIS — W57XXXA Bitten or stung by nonvenomous insect and other nonvenomous arthropods, initial encounter: Secondary | ICD-10-CM | POA: Insufficient documentation

## 2021-11-18 DIAGNOSIS — W57XXXD Bitten or stung by nonvenomous insect and other nonvenomous arthropods, subsequent encounter: Secondary | ICD-10-CM

## 2021-11-18 DIAGNOSIS — R9431 Abnormal electrocardiogram [ECG] [EKG]: Secondary | ICD-10-CM | POA: Diagnosis not present

## 2021-11-18 DIAGNOSIS — S30861D Insect bite (nonvenomous) of abdominal wall, subsequent encounter: Secondary | ICD-10-CM

## 2021-11-18 DIAGNOSIS — R079 Chest pain, unspecified: Secondary | ICD-10-CM | POA: Diagnosis not present

## 2021-11-18 DIAGNOSIS — D696 Thrombocytopenia, unspecified: Secondary | ICD-10-CM | POA: Diagnosis not present

## 2021-11-18 DIAGNOSIS — L03311 Cellulitis of abdominal wall: Secondary | ICD-10-CM | POA: Diagnosis not present

## 2021-11-18 HISTORY — DX: Thrombocytopenia, unspecified: D69.6

## 2021-11-18 LAB — CBC WITH DIFFERENTIAL/PLATELET
Basophils Absolute: 0 10*3/uL (ref 0.0–0.1)
Basophils Relative: 0.7 % (ref 0.0–3.0)
Eosinophils Absolute: 0 10*3/uL (ref 0.0–0.7)
Eosinophils Relative: 1.8 % (ref 0.0–5.0)
HCT: 27.9 % — ABNORMAL LOW (ref 39.0–52.0)
Hemoglobin: 9.7 g/dL — ABNORMAL LOW (ref 13.0–17.0)
Lymphocytes Relative: 35.1 % (ref 12.0–46.0)
Lymphs Abs: 0.9 10*3/uL (ref 0.7–4.0)
MCHC: 34.7 g/dL (ref 30.0–36.0)
MCV: 84 fl (ref 78.0–100.0)
Monocytes Absolute: 0.4 10*3/uL (ref 0.1–1.0)
Monocytes Relative: 15.6 % — ABNORMAL HIGH (ref 3.0–12.0)
Neutro Abs: 1.1 10*3/uL — ABNORMAL LOW (ref 1.4–7.7)
Neutrophils Relative %: 46.8 % (ref 43.0–77.0)
Platelets: 58 10*3/uL — ABNORMAL LOW (ref 150.0–400.0)
RBC: 3.32 Mil/uL — ABNORMAL LOW (ref 4.22–5.81)
RDW: 15 % (ref 11.5–15.5)
WBC: 2.4 10*3/uL — ABNORMAL LOW (ref 4.0–10.5)

## 2021-11-18 LAB — COMPREHENSIVE METABOLIC PANEL
ALT: 5 U/L (ref 0–53)
AST: 17 U/L (ref 0–37)
Albumin: 4.4 g/dL (ref 3.5–5.2)
Alkaline Phosphatase: 56 U/L (ref 39–117)
BUN: 18 mg/dL (ref 6–23)
CO2: 28 mEq/L (ref 19–32)
Calcium: 9.9 mg/dL (ref 8.4–10.5)
Chloride: 103 mEq/L (ref 96–112)
Creatinine, Ser: 1.17 mg/dL (ref 0.40–1.50)
GFR: 58.05 mL/min — ABNORMAL LOW (ref >60.00)
Glucose, Bld: 82 mg/dL (ref 70–99)
Potassium: 4.2 mEq/L (ref 3.5–5.1)
Sodium: 139 mEq/L (ref 135–145)
Total Bilirubin: 0.7 mg/dL (ref 0.2–1.2)
Total Protein: 7.1 g/dL (ref 6.0–8.3)

## 2021-11-18 LAB — TROPONIN I (HIGH SENSITIVITY): High Sens Troponin I: 10 ng/L (ref 2–17)

## 2021-11-18 NOTE — Telephone Encounter (Signed)
Called pt with lab results, pt verbalized understanding.

## 2021-11-18 NOTE — Progress Notes (Signed)
Please call him and let him know that the cardiac blood test was not elevated but if he has any more chest pain, please call 911 or go to the emergency department.  Also, I am referring him to a hematologist (blood specialist) due to worsening anemia and his platelets being much lower.  This needs to be worked up

## 2021-11-18 NOTE — Patient Instructions (Addendum)
Go downstairs for labs before you leave today.   I will be in touch with your results later today.   If your chest pain returns, you can try Tums, but I recommend calling 911 or getting straight to the [facility] ED.   I am referring you to cardiology. They will call you to schedule.

## 2021-11-18 NOTE — Telephone Encounter (Signed)
Patient would like someone to call him with his lab results from today.  Please advise

## 2021-11-18 NOTE — Progress Notes (Signed)
Subjective:     Patient ID: Justin Pagan Su Grand., male    DOB: 09-Sep-1940, 81 y.o.   MRN: [id number]  Chief Complaint  Patient presents with   Follow-up    States the bite has gotten a lot better    HPI Patient is in today for a follow up on cellulitis and insect bite of abdominal wall.  He was on Bactrim and wound culture showed resistance so he was switched to clindamycin. Reports improvement in symptoms since being on new antibiotic.  No fever, chills, N/V.   He also reports a new concern of mid sternal chest pain last night that radiated into his jaw. No pain at the moment.  Pain described as pressure and lasted approximately 20 mins and then resolved spontaneously.  States he ate Tums.  Questions whether pain may be due to acid reflux.   Currently denies pain, palpitations, shortness of breath, abdominal pain, LE edema.     Health Maintenance Due  Topic Date Due   Zoster Vaccines- Shingrix (1 of 2) Never done   TETANUS/TDAP  06/23/2020    Past Medical History:  Diagnosis Date   Alcoholism ([place])    Dry 13 years   Basal cell carcinoma    Right Chest   BPH (benign prostatic hyperplasia)    Diverticulosis of colon    Dizzy spells    none recent   DJD (degenerative joint disease)    lower back   Elbow fracture, left 2009   [name]   GERD (gastroesophageal reflux disease)    Glaucoma    History of TIAs 1 yrs ago   Hyperkeratosis    LBP (low back pain)    Macular degeneration    left wet right dry sees [name]   Melanoma [facility]) 2009   x3 [name]   Osteoarthritis    Restless leg syndrome    Stroke [facility]) 2005   TIA   Tinnitus    Tobacco abuse    Uses walker    Wears glasses    reading   Wears partial dentures    upper    Past Surgical History:  Procedure Laterality Date   CATARACT EXTRACTION Bilateral 2018   HARDWARE REMOVAL Left 07/09/2020   Procedure: Left elbow hardware removal;  Surgeon: [name], MD;  Location: [facility];  Service: Orthopedics;  Laterality: Left;  60 mins   mda     Dr. [name] wet injection   MELANOMA EXCISION  2009   x3   RECONSTRUCTION MEDIAL COLLATERAL LIGAMENT ELBOW W/ TENDON GRAFT  2009   Left, [name]   TONSILLECTOMY  as child   TOTAL KNEE ARTHROPLASTY Right 11/09/2020   Procedure: TOTAL KNEE ARTHROPLASTY;  Surgeon: [name], MD;  Location: [facility];  Service: Orthopedics;  Laterality: Right;    Family History  Problem Relation Age of Onset   Cancer Mother        ?breast   Cancer Father        Lung    Social History   Socioeconomic History   Marital status: Married    Spouse name: Not on file   Number of children: 2   Years of education: Not on file   Highest education level: Not on file  Occupational History   Occupation: [place] [employer] - [employer]    Employer: RETIRED   Occupation: Chiropodist   Occupation: Heritage manager  Tobacco Use   Smoking status: Former  Packs/day: 3.00    Years: 38.00    Pack years: 114.00    Types: Cigarettes    Quit date: 22    Years since quitting: 37.4   Smokeless tobacco: Never   Tobacco comments:    states he quit May 15 1985  Vaping Use   Vaping Use: Never used  Substance and Sexual Activity   Alcohol use: No    Alcohol/week: 0.0 standard drinks    Comment: PREVIOUS - DRY 82yr   Drug use: Not Currently   Sexual activity: Not Currently  Other Topics Concern   Not on file  Social History Narrative   Right handed   One story home   Drinks coffee q am   Social Determinants of Health   Financial Resource Strain: Low Risk    Difficulty of Paying Living Expenses: Not hard at all  Food Insecurity: No Food Insecurity   Worried About RCharity fundraiserin the Last Year: Never true   Ran Out of Food in the Last Year: Never true  Transportation Needs: No Transportation Needs   Lack of Transportation (Medical): No   Lack of Transportation (Non-Medical): No  Physical Activity:  Insufficiently Active   Days of Exercise per Week: 3 days   Minutes of Exercise per Session: 30 min  Stress: No Stress Concern Present   Feeling of Stress : Not at all  Social Connections: Moderately Isolated   Frequency of Communication with Friends and Family: Twice a week   Frequency of Social Gatherings with Friends and Family: Twice a week   Attends Religious Services: Never   AMarine scientistor Organizations: No   Attends CMusic therapist Never   Marital Status: Married  IHuman resources officerViolence: Not At Risk   Fear of Current or Ex-Partner: No   Emotionally Abused: No   Physically Abused: No   Sexually Abused: No    Outpatient Medications Prior to Visit  Medication Sig Dispense Refill   Acetaminophen (TYLENOL PO) Tylenol     calcium carbonate (TUMS EX) 750 MG chewable tablet Chew 1 tablet by mouth daily.     carbidopa-levodopa (SINEMET IR) 25-100 MG tablet For breakthrough restless leg, you can take 1 tablet at bedtime.  OK to take extra tab as needed during the day. (Patient taking differently: Take 1 tablet by mouth at bedtime.) 100 tablet 3   Cholecalciferol 1000 UNITS tablet Take 3,000 Units by mouth daily.     clindamycin (CLEOCIN) 300 MG capsule Take 1 capsule (300 mg total) by mouth 3 (three) times daily for 5 days. 15 capsule 0   Cyanocobalamin (VITAMIN B-12) 1000 MCG SUBL DISSOLVE 2 TABLETS IN MOUTH EVERY DAY 180 tablet 1   ferrous sulfate 325 (65 FE) MG tablet Take 650 mg by mouth daily with breakfast.     finasteride (PROSCAR) 5 MG tablet Take 1 tablet (5 mg total) by mouth daily. 90 tablet 1   gabapentin (NEURONTIN) 300 MG capsule Take 1 capsule (300 mg total) by mouth 4 (four) times daily. 360 capsule 1   ibuprofen (ADVIL) 200 MG tablet Take 400 mg by mouth daily as needed.     magnesium hydroxide (MILK OF MAGNESIA) 400 MG/5ML suspension Take 15 mLs by mouth daily as needed for mild constipation.     meclizine (ANTIVERT) 12.5 MG tablet TAKE 1  TABLET BY MOUTH 3 TIMES DAILY AS NEEDED FOR DIZZINESS. 60 tablet 2   Multiple Vitamins-Minerals (PRESERVISION AREDS PO) Take 2 tablets by  mouth 2 (two) times daily.     Oxycodone HCl 10 MG TABS Take 10 mg by mouth 5 (five) times daily as needed (pain). Typically only uses 4 times daily     Polyethyl Glycol-Propyl Glycol (SYSTANE OP) Place 1 drop into both eyes 2 (two) times daily as needed (dry eyes).     pravastatin (PRAVACHOL) 20 MG tablet Take 1 tablet (20 mg total) by mouth daily. 90 tablet 1   rOPINIRole (REQUIP) 2 MG tablet TAKE 1 TABLET BY MOUTH AT 12 PM 1 TABLET AT 4 PM AND 1 TABLETS AT BEDTIME 360 tablet 1   tamsulosin (FLOMAX) 0.4 MG CAPS capsule Take 1 capsule (0.4 mg total) by mouth in the morning. 90 capsule 1   terazosin (HYTRIN) 2 MG capsule TAKE 1 CAPSULE BY MOUTH EVERYDAY AT BEDTIME 90 capsule 1   sulfamethoxazole-trimethoprim (BACTRIM DS) 800-160 MG tablet Take 1 tablet by mouth 2 (two) times daily. 20 tablet 0   No facility-administered medications prior to visit.    Allergies  Allergen Reactions   Levofloxacin     REACTION: hands pealed    ROS Pertinent positives and negatives in the history of present illness.     Objective:    Physical Exam Constitutional:      General: He is not in acute distress.    Appearance: He is not ill-appearing.  Cardiovascular:     Rate and Rhythm: Regular rhythm. Bradycardia present.     Pulses: Normal pulses.  Pulmonary:     Effort: Pulmonary effort is normal.     Breath sounds: Normal breath sounds.  Abdominal:     Palpations: Abdomen is soft.  Skin:    General: Skin is warm and dry.     Capillary Refill: Capillary refill takes less than 2 seconds.     Comments: Abdominal wall wound is healing. Non fluctuant area of mild firmness. No surrounding erythema.   Neurological:     General: No focal deficit present.     Mental Status: He is alert and oriented to person, place, and time.     Motor: No weakness.     Gait: Gait  normal.  Psychiatric:        Mood and Affect: Mood normal.        Behavior: Behavior normal.    BP (!) 154/76 (BP Location: Right Arm, Patient Position: Sitting, Cuff Size: Large)   Pulse (!) 48 Comment: EKG  Temp (!) 97.5 F (36.4 C) (Temporal)   Ht 6' (1.829 m)   Wt 170 lb (77.1 kg)   SpO2 98%   BMI 23.06 kg/m  Wt Readings from Last 3 Encounters:  11/18/21 170 lb (77.1 kg)  11/11/21 171 lb (77.6 kg)  10/18/21 171 lb (77.6 kg)           Assessment & Plan:   Problem List Items Addressed This Visit       Musculoskeletal and Integument   Insect bite of abdominal wall     Other   Abnormal EKG   Relevant Orders   Ambulatory referral to Cardiology   Cellulitis of abdominal wall - Primary   Chest pain   Relevant Orders   CBC with Differential/Platelet   Comprehensive metabolic panel   Troponin I (High Sensitivity)   EKG 12-Lead   Ambulatory referral to Cardiology   Currently asymptomatic.  EKG changes noted EKG: nonspecific ST and T waves changes, sinus bradycardia, rate 48, RBBB, changes from previous EKG in 2022.  Patient is in  today for a follow-up of an insect bite and cellulitis on his abdominal wall which has been improving significantly since starting on clindamycin.  He will complete the antibiotic.  He mentioned having chest pain the past 2 days intermittently just before leaving the visit today. The pain is concerning.  EKG shows abnormalities that were not present in 2022.  Currently he is pain-free, symptom-free.  I will check labs including troponin.  He is aware that if his troponin is abnormal that I will call and have him go to the [facility] emergency department.  Strict precautions that if his chest pain returns that he will call 911 or have his son take him to the ED as well. Referral to cardiology.    I have discontinued [place] [facility]s sulfamethoxazole-trimethoprim. I am also having him maintain his Cholecalciferol, Multiple  Vitamins-Minerals (PRESERVISION AREDS PO), carbidopa-levodopa, Oxycodone HCl, ferrous sulfate, Polyethyl Glycol-Propyl Glycol (SYSTANE OP), ibuprofen, magnesium hydroxide, Acetaminophen (TYLENOL PO), Vitamin B-12, finasteride, gabapentin, meclizine, pravastatin, terazosin, tamsulosin, rOPINIRole, calcium carbonate, and clindamycin.  No orders of the defined types were placed in this encounter.

## 2021-11-21 ENCOUNTER — Telehealth: Payer: Self-pay | Admitting: Physician Assistant

## 2021-11-21 ENCOUNTER — Ambulatory Visit (INDEPENDENT_AMBULATORY_CARE_PROVIDER_SITE_OTHER): Payer: PPO | Admitting: Internal Medicine

## 2021-11-21 ENCOUNTER — Encounter: Payer: Self-pay | Admitting: Internal Medicine

## 2021-11-21 DIAGNOSIS — D649 Anemia, unspecified: Secondary | ICD-10-CM

## 2021-11-21 DIAGNOSIS — L0292 Furuncle, unspecified: Secondary | ICD-10-CM | POA: Insufficient documentation

## 2021-11-21 DIAGNOSIS — D692 Other nonthrombocytopenic purpura: Secondary | ICD-10-CM | POA: Diagnosis not present

## 2021-11-21 DIAGNOSIS — D225 Melanocytic nevi of trunk: Secondary | ICD-10-CM | POA: Diagnosis not present

## 2021-11-21 DIAGNOSIS — Z85828 Personal history of other malignant neoplasm of skin: Secondary | ICD-10-CM | POA: Diagnosis not present

## 2021-11-21 DIAGNOSIS — R079 Chest pain, unspecified: Secondary | ICD-10-CM | POA: Diagnosis not present

## 2021-11-21 DIAGNOSIS — L723 Sebaceous cyst: Secondary | ICD-10-CM | POA: Diagnosis not present

## 2021-11-21 DIAGNOSIS — L570 Actinic keratosis: Secondary | ICD-10-CM | POA: Diagnosis not present

## 2021-11-21 DIAGNOSIS — D2271 Melanocytic nevi of right lower limb, including hip: Secondary | ICD-10-CM | POA: Diagnosis not present

## 2021-11-21 DIAGNOSIS — C4442 Squamous cell carcinoma of skin of scalp and neck: Secondary | ICD-10-CM | POA: Diagnosis not present

## 2021-11-21 DIAGNOSIS — D2371 Other benign neoplasm of skin of right lower limb, including hip: Secondary | ICD-10-CM | POA: Diagnosis not present

## 2021-11-21 NOTE — Assessment & Plan Note (Signed)
Worse - r/o MDS Hem appt is pending

## 2021-11-21 NOTE — Progress Notes (Signed)
Subjective:  Patient ID: Justin Pagan Su Grand., male    DOB: 05/27/1941  Age: 81 y.o. MRN: [id number]  CC: No chief complaint on file.   HPI Justin [name]. presents for a boil on the L abd wall - much better (finished abx this am). F/u on CP - no relapsed. F/u on anemia.  Outpatient Medications Prior to Visit  Medication Sig Dispense Refill   Acetaminophen (TYLENOL PO) Tylenol     calcium carbonate (TUMS EX) 750 MG chewable tablet Chew 1 tablet by mouth daily.     carbidopa-levodopa (SINEMET IR) 25-100 MG tablet For breakthrough restless leg, you can take 1 tablet at bedtime.  OK to take extra tab as needed during the day. (Patient taking differently: Take 1 tablet by mouth at bedtime.) 100 tablet 3   Cholecalciferol 1000 UNITS tablet Take 3,000 Units by mouth daily.     Cyanocobalamin (VITAMIN B-12) 1000 MCG SUBL DISSOLVE 2 TABLETS IN MOUTH EVERY DAY 180 tablet 1   ferrous sulfate 325 (65 FE) MG tablet Take 650 mg by mouth daily with breakfast.     finasteride (PROSCAR) 5 MG tablet Take 1 tablet (5 mg total) by mouth daily. 90 tablet 1   gabapentin (NEURONTIN) 300 MG capsule Take 1 capsule (300 mg total) by mouth 4 (four) times daily. 360 capsule 1   ibuprofen (ADVIL) 200 MG tablet Take 400 mg by mouth daily as needed.     magnesium hydroxide (MILK OF MAGNESIA) 400 MG/5ML suspension Take 15 mLs by mouth daily as needed for mild constipation.     meclizine (ANTIVERT) 12.5 MG tablet TAKE 1 TABLET BY MOUTH 3 TIMES DAILY AS NEEDED FOR DIZZINESS. 60 tablet 2   Multiple Vitamins-Minerals (PRESERVISION AREDS PO) Take 2 tablets by mouth 2 (two) times daily.     Oxycodone HCl 10 MG TABS Take 10 mg by mouth 5 (five) times daily as needed (pain). Typically only uses 4 times daily     Polyethyl Glycol-Propyl Glycol (SYSTANE OP) Place 1 drop into both eyes 2 (two) times daily as needed (dry eyes).     pravastatin (PRAVACHOL) 20 MG tablet Take 1 tablet (20 mg total) by mouth daily. 90 tablet 1    rOPINIRole (REQUIP) 2 MG tablet TAKE 1 TABLET BY MOUTH AT 12 PM 1 TABLET AT 4 PM AND 1 TABLETS AT BEDTIME 360 tablet 1   tamsulosin (FLOMAX) 0.4 MG CAPS capsule Take 1 capsule (0.4 mg total) by mouth in the morning. 90 capsule 1   terazosin (HYTRIN) 2 MG capsule TAKE 1 CAPSULE BY MOUTH EVERYDAY AT BEDTIME 90 capsule 1   No facility-administered medications prior to visit.    ROS: Review of Systems  Constitutional:  Positive for fatigue. Negative for chills and fever.  Musculoskeletal:  Positive for arthralgias and gait problem.  Hematological:  Bruises/bleeds easily.  Psychiatric/Behavioral:  The patient is not nervous/anxious.    Objective:  BP 122/60 (BP Location: Left Arm, Patient Position: Sitting, Cuff Size: Large)   Pulse 66   Temp 98.2 F (36.8 C) (Oral)   Ht 6' (1.829 m)   Wt 167 lb (75.8 kg)   SpO2 94%   BMI 22.65 kg/m   BP Readings from Last 3 Encounters:  11/21/21 122/60  11/18/21 (!) 154/76  11/11/21 140/76    Wt Readings from Last 3 Encounters:  11/21/21 167 lb (75.8 kg)  11/18/21 170 lb (77.1 kg)  11/11/21 171 lb (77.6 kg)    Physical Exam Constitutional:  General: He is not in acute distress.    Appearance: Normal appearance. He is well-developed.     Comments: NAD  Eyes:     Conjunctiva/sclera: Conjunctivae normal.     Pupils: Pupils are equal, round, and reactive to light.  Neck:     Thyroid: No thyromegaly.     Vascular: No JVD.  Cardiovascular:     Rate and Rhythm: Normal rate and regular rhythm.     Heart sounds: Normal heart sounds. No murmur heard.   No friction rub. No gallop.  Pulmonary:     Effort: Pulmonary effort is normal. No respiratory distress.     Breath sounds: Normal breath sounds. No wheezing or rales.  Chest:     Chest wall: No tenderness.  Abdominal:     General: Bowel sounds are normal. There is no distension.     Palpations: Abdomen is soft. There is no mass.     Tenderness: There is no abdominal tenderness.  There is no guarding or rebound.  Musculoskeletal:        General: Tenderness present. Normal range of motion.     Cervical back: Normal range of motion.  Lymphadenopathy:     Cervical: No cervical adenopathy.  Skin:    General: Skin is warm and dry.     Findings: No rash.  Neurological:     Mental Status: He is alert and oriented to person, place, and time.     Cranial Nerves: No cranial nerve deficit.     Motor: No abnormal muscle tone.     Coordination: Coordination normal.     Gait: Gait abnormal.     Deep Tendon Reflexes: Reflexes are normal and symmetric.  Psychiatric:        Behavior: Behavior normal.        Thought Content: Thought content normal.        Judgment: Judgment normal.  Bruises Abscess is healing  Lab Results  Component Value Date   WBC 2.4 Repeated and verified X2. (L) 11/18/2021   HGB 9.7 (L) 11/18/2021   HCT 27.9 (L) 11/18/2021   PLT 58.0 (L) 11/18/2021   GLUCOSE 82 11/18/2021   CHOL 136 01/12/2017   TRIG 67.0 01/12/2017   HDL 54.70 01/12/2017   LDLCALC 68 01/12/2017   ALT 5 11/18/2021   AST 17 11/18/2021   NA 139 11/18/2021   K 4.2 11/18/2021   CL 103 11/18/2021   CREATININE 1.17 11/18/2021   BUN 18 11/18/2021   CO2 28 11/18/2021   TSH 3.05 10/16/2019   PSA 0.47 08/30/2016   INR 1.1 10/28/2020   HGBA1C 5.3 04/19/2021    No results found.  Assessment & Plan:   Problem List Items Addressed This Visit     CHEST PAIN    Resolved. No relapse Card ref is pending       Anemia    Worse - r/o MDS Hem appt is pending       Boil    Better. Finished the abx          No orders of the defined types were placed in this encounter.     Follow-up: Return in about 3 months (around 02/21/2022) for a follow-up visit.  [name], MD

## 2021-11-21 NOTE — Telephone Encounter (Signed)
Scheduled appt per 5/22 referral. Pt is aware of appt date and time. Pt is aware to arrive 15 mins prior to appt time and to bring and updated insurance card. Pt is aware of appt location.   

## 2021-11-21 NOTE — Assessment & Plan Note (Signed)
Better. Finished the abx

## 2021-11-21 NOTE — Assessment & Plan Note (Signed)
Resolved. No relapse Card ref is pending

## 2021-11-22 ENCOUNTER — Ambulatory Visit: Payer: PPO | Admitting: Internal Medicine

## 2021-11-22 ENCOUNTER — Encounter: Payer: Self-pay | Admitting: Internal Medicine

## 2021-11-22 VITALS — BP 100/60 | HR 84 | Ht 72.0 in | Wt 170.0 lb

## 2021-11-22 DIAGNOSIS — I2510 Atherosclerotic heart disease of native coronary artery without angina pectoris: Secondary | ICD-10-CM

## 2021-11-22 DIAGNOSIS — R079 Chest pain, unspecified: Secondary | ICD-10-CM | POA: Diagnosis not present

## 2021-11-22 DIAGNOSIS — I10 Essential (primary) hypertension: Secondary | ICD-10-CM | POA: Diagnosis not present

## 2021-11-22 DIAGNOSIS — I2584 Coronary atherosclerosis due to calcified coronary lesion: Secondary | ICD-10-CM | POA: Diagnosis not present

## 2021-11-22 DIAGNOSIS — D649 Anemia, unspecified: Secondary | ICD-10-CM | POA: Diagnosis not present

## 2021-11-22 DIAGNOSIS — R9431 Abnormal electrocardiogram [ECG] [EKG]: Secondary | ICD-10-CM | POA: Diagnosis not present

## 2021-11-22 NOTE — Progress Notes (Unsigned)
Cardiology Office Note:    Date:  11/22/2021   ID:  Justin [name]., DOB Jan 19, 1941, MRN [id number]  PCP:  [name], MD  Cardiologist:  Elouise Munroe, MD  Electrophysiologist:  None   Referring MD: [name], NP-C   Chief Complaint/Reason for Referral: ***  History of Present Illness:    Justin [name]. is a 81 y.o. male with a history of ***  Chest pain wed/thurs night - pizza and felt it was indigestion. Took some tums but didn't act quick, time made it resolve  Past Medical History:  Diagnosis Date   Alcoholism ([place])    Dry 13 years   Basal cell carcinoma    Right Chest   BPH (benign prostatic hyperplasia)    Diverticulosis of colon    Dizzy spells    none recent   DJD (degenerative joint disease)    lower back   Elbow fracture, left 2009   [name]   GERD (gastroesophageal reflux disease)    Glaucoma    History of TIAs 1 yrs ago   Hyperkeratosis    LBP (low back pain)    Macular degeneration    left wet right dry sees [name]   Melanoma [facility]) 2009   x3 [name]   Osteoarthritis    Restless leg syndrome    Stroke [facility]) 2005   TIA   Thrombocytopenia ([place]) 11/18/2021   Tinnitus    Tobacco abuse    Uses walker    Wears glasses    reading   Wears partial dentures    upper    Past Surgical History:  Procedure Laterality Date   CATARACT EXTRACTION Bilateral 2018   HARDWARE REMOVAL Left 07/09/2020   Procedure: Left elbow hardware removal;  Surgeon: [name], MD;  Location: [facility];  Service: Orthopedics;  Laterality: Left;  60 mins   mda     Dr. [name] wet injection   MELANOMA EXCISION  2009   x3   RECONSTRUCTION MEDIAL COLLATERAL LIGAMENT ELBOW W/ TENDON GRAFT  2009   Left, [name]   TONSILLECTOMY  as child   TOTAL KNEE ARTHROPLASTY Right 11/09/2020   Procedure: TOTAL KNEE ARTHROPLASTY;  Surgeon: [name], MD;  Location: [facility];  Service: Orthopedics;  Laterality: Right;     Current Medications: Current Meds  Medication Sig   Acetaminophen (TYLENOL PO) Tylenol   calcium carbonate (TUMS EX) 750 MG chewable tablet Chew 1 tablet by mouth daily.   carbidopa-levodopa (SINEMET IR) 25-100 MG tablet For breakthrough restless leg, you can take 1 tablet at bedtime.  OK to take extra tab as needed during the day. (Patient taking differently: Take 1 tablet by mouth at bedtime.)   Cholecalciferol 1000 UNITS tablet Take 3,000 Units by mouth daily.   Cyanocobalamin (VITAMIN B-12) 1000 MCG SUBL DISSOLVE 2 TABLETS IN MOUTH EVERY DAY   ferrous sulfate 325 (65 FE) MG tablet Take 650 mg by mouth daily with breakfast.   finasteride (PROSCAR) 5 MG tablet Take 1 tablet (5 mg total) by mouth daily.   gabapentin (NEURONTIN) 300 MG capsule Take 1 capsule (300 mg total) by mouth 4 (four) times daily.   ibuprofen (ADVIL) 200 MG tablet Take 400 mg by mouth daily as needed.   meclizine (ANTIVERT) 12.5 MG tablet TAKE 1 TABLET BY MOUTH 3 TIMES DAILY AS NEEDED FOR DIZZINESS.   Multiple Vitamins-Minerals (PRESERVISION AREDS PO) Take 2 tablets by mouth 2 (two) times  daily.   Oxycodone HCl 10 MG TABS Take 10 mg by mouth 5 (five) times daily as needed (pain). Typically only uses 4 times daily   Polyethyl Glycol-Propyl Glycol (SYSTANE OP) Place 1 drop into both eyes 2 (two) times daily as needed (dry eyes).   pravastatin (PRAVACHOL) 20 MG tablet Take 1 tablet (20 mg total) by mouth daily.   rOPINIRole (REQUIP) 2 MG tablet TAKE 1 TABLET BY MOUTH AT 12 PM 1 TABLET AT 4 PM AND 1 TABLETS AT BEDTIME   tamsulosin (FLOMAX) 0.4 MG CAPS capsule Take 1 capsule (0.4 mg total) by mouth in the morning.   terazosin (HYTRIN) 2 MG capsule TAKE 1 CAPSULE BY MOUTH EVERYDAY AT BEDTIME     Allergies:   Levofloxacin   Social History   Tobacco Use   Smoking status: Former    Packs/day: 3.00    Years: 38.00    Pack years: 114.00    Types: Cigarettes    Quit date: 1986    Years since quitting: 37.4    Smokeless tobacco: Never   Tobacco comments:    states he quit May 15 1985  Vaping Use   Vaping Use: Never used  Substance Use Topics   Alcohol use: No    Alcohol/week: 0.0 standard drinks    Comment: PREVIOUS - DRY 39yr   Drug use: Not Currently     Family History: The patient's family history includes Cancer in his father and mother.  ROS:   Please see the history of present illness.    All other systems reviewed and are negative.  EKGs/Labs/Other Studies Reviewed:    The following studies were reviewed today:  EKG:  NSR, iRBBB  Imaging studies that I have independently reviewed today: nuclear stress 08/08/2017 - significant diaphragmatic artifact  Recent Labs: 11/18/2021: ALT 5; BUN 18; Creatinine, Ser 1.17; Hemoglobin 9.7; Platelets 58.0; Potassium 4.2; Sodium 139  Recent Lipid Panel    Component Value Date/Time   CHOL 136 01/12/2017 1624   TRIG 67.0 01/12/2017 1624   HDL 54.70 01/12/2017 1624   CHOLHDL 2 01/12/2017 1624   VLDL 13.4 01/12/2017 1624   LDLCALC 68 01/12/2017 1624    Physical Exam:    VS:  BP 100/60   Pulse 84   Ht 6' (1.829 m)   Wt 170 lb (77.1 kg)   SpO2 92%   BMI 23.06 kg/m     Wt Readings from Last 5 Encounters:  11/22/21 170 lb (77.1 kg)  11/21/21 167 lb (75.8 kg)  11/18/21 170 lb (77.1 kg)  11/11/21 171 lb (77.6 kg)  10/18/21 171 lb (77.6 kg)    Constitutional: No acute distress Eyes: sclera non-icteric, normal conjunctiva and lids ENMT: normal dentition, moist mucous membranes Cardiovascular: regular rhythm, normal rate, no murmur. S1 and S2 normal. No jugular venous distention.  Respiratory: clear to auscultation bilaterally GI : normal bowel sounds, soft and nontender. No distention.   MSK: extremities warm, well perfused. No edema.  NEURO: grossly nonfocal exam, moves all extremities. PSYCH: alert and oriented x 3, normal mood and affect.   ASSESSMENT:    1. Chest pain of uncertain etiology    PLAN:    Chest pain of  uncertain etiology - Plan: EKG 12-Lead  - patient defers cardiac testing, feels this was situational and GI in origin. Counseled at length about options for noninvasive stress testing including nuclear MPI and CCTA. He would like to observe his sx.   Total time of encounter: *** minutes  total time of encounter, including *** minutes spent in face-to-face patient care on the date of this encounter. This time includes coordination of care and counseling regarding above mentioned problem list. Remainder of non-face-to-face time involved reviewing chart documents/testing relevant to the patient encounter and documentation in the medical record. I have independently reviewed documentation from referring provider.   Cherlynn Kaiser, MD, [place]   Shared Decision Making/Informed Consent:       Medication Adjustments/Labs and Tests Ordered: Current medicines are reviewed at length with the patient today.  Concerns regarding medicines are outlined above.   Orders Placed This Encounter  Procedures   EKG 12-Lead    No orders of the defined types were placed in this encounter.   Patient Instructions  Medication Instructions:  No Changes In Medications at this time.  *If you need a refill on your cardiac medications before your next appointment, please call your pharmacy*  Follow-Up: At [facility], you and your health needs are our priority.  As part of our continuing mission to provide you with exceptional heart care, we have created designated Provider Care Teams.  These Care Teams include your primary Cardiologist (physician) and Advanced Practice Providers (APPs -  Physician Assistants and Nurse Practitioners) who all work together to provide you with the care you need, when you need it.  Your next appointment:   AS NEEDED   The format for your next appointment:   In Person  Provider:   Elouise Munroe, MD

## 2021-11-22 NOTE — Patient Instructions (Signed)
Medication Instructions:  No Changes In Medications at this time.  *If you need a refill on your cardiac medications before your next appointment, please call your pharmacy*  Follow-Up: At [facility], you and your health needs are our priority.  As part of our continuing mission to provide you with exceptional heart care, we have created designated Provider Care Teams.  These Care Teams include your primary Cardiologist (physician) and Advanced Practice Providers (APPs -  Physician Assistants and Nurse Practitioners) who all work together to provide you with the care you need, when you need it.  Your next appointment:   AS NEEDED   The format for your next appointment:   In Person  Provider:   Elouise Munroe, MD

## 2021-11-29 DIAGNOSIS — C44329 Squamous cell carcinoma of skin of other parts of face: Secondary | ICD-10-CM | POA: Diagnosis not present

## 2021-11-29 DIAGNOSIS — Z85828 Personal history of other malignant neoplasm of skin: Secondary | ICD-10-CM | POA: Diagnosis not present

## 2021-12-01 ENCOUNTER — Ambulatory Visit: Payer: PPO | Admitting: Podiatry

## 2021-12-01 DIAGNOSIS — B351 Tinea unguium: Secondary | ICD-10-CM | POA: Diagnosis not present

## 2021-12-01 DIAGNOSIS — G629 Polyneuropathy, unspecified: Secondary | ICD-10-CM

## 2021-12-01 DIAGNOSIS — M79675 Pain in left toe(s): Secondary | ICD-10-CM

## 2021-12-01 DIAGNOSIS — Q828 Other specified congenital malformations of skin: Secondary | ICD-10-CM | POA: Diagnosis not present

## 2021-12-01 DIAGNOSIS — M79674 Pain in right toe(s): Secondary | ICD-10-CM | POA: Diagnosis not present

## 2021-12-03 NOTE — Progress Notes (Signed)
Subjective: 81 y.o. returns the office today for painful, elongated, thickened toenails which he cannot trim himself and also a painful corn second toe on the left foot.  Denies any open lesions.  He continues with offloading for the hammertoe.  No fevers or chills.  No other concerns.   PCP: [name], MD Last seen Nov 21, 2021  Objective: AAO 3, NAD DP/PT pulses palpable, CRT less than 3 seconds Neurological status is unchanged.  Nails hypertrophic, dystrophic, elongated, brittle, discolored 10. There is tenderness overlying the nails 1-5 bilaterally. There is no surrounding erythema or drainage along the nail sites. Thick hyperkeratotic tissue present distal aspect left second toe without any underlying ulceration drainage or any signs of infection.   Hammertoes present. No pain with calf compression, swelling, warmth, erythema.  Assessment: Patient presents with symptomatic onychomycosis; hyperkeratotic lesion  Plan: -Treatment options including alternatives, risks, complications were discussed -Nails sharply debrided 10 without complication/bleeding. -Hyperkeratotic lesion sharply debrided x 1 without any complications.  Continue with offloading -Monitor for any clinical signs or symptoms of infection and directed to call the office immediately should any occur or go to the ER.  Trula Slade DPM

## 2021-12-05 ENCOUNTER — Ambulatory Visit (INDEPENDENT_AMBULATORY_CARE_PROVIDER_SITE_OTHER): Payer: PPO | Admitting: Ophthalmology

## 2021-12-05 ENCOUNTER — Encounter (INDEPENDENT_AMBULATORY_CARE_PROVIDER_SITE_OTHER): Payer: Self-pay | Admitting: Ophthalmology

## 2021-12-05 DIAGNOSIS — H353111 Nonexudative age-related macular degeneration, right eye, early dry stage: Secondary | ICD-10-CM | POA: Diagnosis not present

## 2021-12-05 DIAGNOSIS — H353221 Exudative age-related macular degeneration, left eye, with active choroidal neovascularization: Secondary | ICD-10-CM | POA: Diagnosis not present

## 2021-12-05 MED ORDER — AFLIBERCEPT 2MG/0.05ML IZ SOLN FOR KALEIDOSCOPE
2.0000 mg | INTRAVITREAL | Status: AC | PRN
  Administered 2021-12-05: 2 mg via INTRAVITREAL

## 2021-12-05 NOTE — Assessment & Plan Note (Signed)
No sign of CNVM OD on examination today

## 2021-12-05 NOTE — Progress Notes (Signed)
12/05/2021     CHIEF COMPLAINT Patient presents for  Chief Complaint  Patient presents with   Macular Degeneration      HISTORY OF PRESENT ILLNESS: Justin Studer. is a 81 y.o. male who presents to the clinic today for:   HPI   10 weeks dilate OU, Eylea OCT OS. Patient states vision is stable and unchanged since last visit. Denies any new floaters or FOL. Patient reports "last Tuesday I had cancer taken off the top of my head, that is why my cheek bones are bruised and swollen up. It was a lot worse. It didn't bother my eyes." Last edited by Laurin Coder on 12/05/2021  8:19 AM.      Referring physician: [name], MD [place],  [place] [address]  HISTORICAL INFORMATION:   Selected notes from the MEDICAL RECORD NUMBER    Lab Results  Component Value Date   HGBA1C 5.3 04/19/2021     CURRENT MEDICATIONS: Current Outpatient Medications (Ophthalmic Drugs)  Medication Sig   Polyethyl Glycol-Propyl Glycol (SYSTANE OP) Place 1 drop into both eyes 2 (two) times daily as needed (dry eyes).   No current facility-administered medications for this visit. (Ophthalmic Drugs)   Current Outpatient Medications (Other)  Medication Sig   Acetaminophen (TYLENOL PO) Tylenol   calcium carbonate (TUMS EX) 750 MG chewable tablet Chew 1 tablet by mouth daily.   carbidopa-levodopa (SINEMET IR) 25-100 MG tablet For breakthrough restless leg, you can take 1 tablet at bedtime.  OK to take extra tab as needed during the day. (Patient taking differently: Take 1 tablet by mouth at bedtime.)   Cholecalciferol 1000 UNITS tablet Take 3,000 Units by mouth daily.   Cyanocobalamin (VITAMIN B-12) 1000 MCG SUBL DISSOLVE 2 TABLETS IN MOUTH EVERY DAY   ferrous sulfate 325 (65 FE) MG tablet Take 650 mg by mouth daily with breakfast.   finasteride (PROSCAR) 5 MG tablet Take 1 tablet (5 mg total) by mouth daily.   gabapentin (NEURONTIN) 300 MG capsule Take 1 capsule (300 mg  total) by mouth 4 (four) times daily.   ibuprofen (ADVIL) 200 MG tablet Take 400 mg by mouth daily as needed.   magnesium hydroxide (MILK OF MAGNESIA) 400 MG/5ML suspension Take 15 mLs by mouth daily as needed for mild constipation. (Patient not taking: Reported on 11/22/2021)   meclizine (ANTIVERT) 12.5 MG tablet TAKE 1 TABLET BY MOUTH 3 TIMES DAILY AS NEEDED FOR DIZZINESS.   Multiple Vitamins-Minerals (PRESERVISION AREDS PO) Take 2 tablets by mouth 2 (two) times daily.   Oxycodone HCl 10 MG TABS Take 10 mg by mouth 5 (five) times daily as needed (pain). Typically only uses 4 times daily   pravastatin (PRAVACHOL) 20 MG tablet Take 1 tablet (20 mg total) by mouth daily.   rOPINIRole (REQUIP) 2 MG tablet TAKE 1 TABLET BY MOUTH AT 12 PM 1 TABLET AT 4 PM AND 1 TABLETS AT BEDTIME   tamsulosin (FLOMAX) 0.4 MG CAPS capsule Take 1 capsule (0.4 mg total) by mouth in the morning.   terazosin (HYTRIN) 2 MG capsule TAKE 1 CAPSULE BY MOUTH EVERYDAY AT BEDTIME   No current facility-administered medications for this visit. (Other)      REVIEW OF SYSTEMS: ROS   Negative for: Constitutional, Gastrointestinal, Neurological, Skin, Genitourinary, Musculoskeletal, HENT, Endocrine, Cardiovascular, Eyes, Respiratory, Psychiatric, Allergic/Imm, Heme/Lymph Last edited by Hurman Horn, MD on 12/05/2021  8:53 AM.       ALLERGIES Allergies  Allergen  Reactions   Levofloxacin     REACTION: hands pealed    PAST MEDICAL HISTORY Past Medical History:  Diagnosis Date   Alcoholism ([place])    Dry 13 years   Basal cell carcinoma    Right Chest   BPH (benign prostatic hyperplasia)    Diverticulosis of colon    Dizzy spells    none recent   DJD (degenerative joint disease)    lower back   Elbow fracture, left 2009   [name]   GERD (gastroesophageal reflux disease)    Glaucoma    History of TIAs 1 yrs ago   Hyperkeratosis    LBP (low back pain)    Macular degeneration    left wet right dry sees [name]    Melanoma [facility]) 2009   x3 [name]   Osteoarthritis    Restless leg syndrome    Stroke [facility]) 2005   TIA   Thrombocytopenia ([place]) 11/18/2021   Tinnitus    Tobacco abuse    Uses walker    Wears glasses    reading   Wears partial dentures    upper   Past Surgical History:  Procedure Laterality Date   CATARACT EXTRACTION Bilateral 2018   HARDWARE REMOVAL Left 07/09/2020   Procedure: Left elbow hardware removal;  Surgeon: [name], MD;  Location: [facility]  Service: Orthopedics;  Laterality: Left;  60 mins   mda     Dr. [name] wet injection   MELANOMA EXCISION  2009   x3   RECONSTRUCTION MEDIAL COLLATERAL LIGAMENT ELBOW W/ TENDON GRAFT  2009   Left, [name]   TONSILLECTOMY  as child   TOTAL KNEE ARTHROPLASTY Right 11/09/2020   Procedure: TOTAL KNEE ARTHROPLASTY;  Surgeon: [name], MD;  Location: [facility];  Service: Orthopedics;  Laterality: Right;    FAMILY HISTORY Family History  Problem Relation Age of Onset   Cancer Mother        ?breast   Cancer Father        Lung    SOCIAL HISTORY Social History   Tobacco Use   Smoking status: Former    Packs/day: 3.00    Years: 38.00    Pack years: 114.00    Types: Cigarettes    Quit date: 1986    Years since quitting: 37.4   Smokeless tobacco: Never   Tobacco comments:    states he quit May 15 1985  Vaping Use   Vaping Use: Never used  Substance Use Topics   Alcohol use: No    Alcohol/week: 0.0 standard drinks    Comment: PREVIOUS - DRY 11yr   Drug use: Not Currently         OPHTHALMIC EXAM:  Base Eye Exam     Visual Acuity (ETDRS)       Right Left   Dist [place] 20/25 +2 20/100 -2   Dist ph [place]  20/80 +2         Tonometry (Tonopen, 8:22 AM)       Right Left   Pressure 15 14         Pupils       Pupils Dark Light React APD   Right PERRL 3 3 Minimal None   Left PERRL 3 3 Minimal None         Extraocular Movement       Right Left    Full Full          Neuro/Psych  Oriented x3: Yes   Mood/Affect: Normal         Dilation     Both eyes: 1.0% Mydriacyl, 2.5% Phenylephrine @ 8:22 AM           Slit Lamp and Fundus Exam     External Exam       Right Left   External Normal Normal         Slit Lamp Exam       Right Left   Lids/Lashes Normal Normal   Conjunctiva/Sclera White and quiet White and quiet   Cornea Clear Clear   Anterior Chamber Deep and quiet Deep and quiet   Iris Round and reactive Round and reactive   Lens Posterior chamber intraocular lens Posterior chamber intraocular lens   Anterior Vitreous Normal Normal         Fundus Exam       Right Left   Posterior Vitreous Posterior vitreous detachment Posterior vitreous detachment   Disc Normal Normal   C/D Ratio 0.5 0.65   Macula Hard drusen, no exudates, no membrane, Retinal pigment epithelial mottling Retinal pigment epithelial detachment, Retinal pigment epithelial mottling, Drusen, Hard drusen, Atrophy in the p.m. bundle region, intermediate age related macular degeneration, Geographic atrophy   Vessels  Normal   Periphery  Normal            IMAGING AND PROCEDURES  Imaging and Procedures for 12/05/21  Intravitreal Injection, Pharmacologic Agent - OS - Left Eye       Time Out 12/05/2021. 8:56 AM. Confirmed correct patient, procedure, site, and patient consented.   Anesthesia Topical anesthesia was used. Anesthetic medications included Lidocaine 4%.   Procedure Preparation included Tobramycin 0.3%, 5% betadine to ocular surface, 10% betadine to eyelids. A 30 gauge needle was used.   Injection: 2 mg aflibercept 2 MG/0.05ML   Route: Intravitreal, Site: Left Eye   NDC: [id number], Lot: [id number], Waste: 0 mL   Post-op Post injection exam found visual acuity of at least counting fingers. The patient tolerated the procedure well. There were no complications. The patient received written and verbal post procedure care education. Post  injection medications included gentamicin.      OCT, Retina - OU - Both Eyes       Right Eye Quality was good. Scan locations included subfoveal. Central Foveal Thickness: 290. Progression has been stable. Findings include normal foveal contour, retinal drusen , no IRF, no SRF.   Left Eye Quality was good. Scan locations included subfoveal. Central Foveal Thickness: 221. Progression has been stable. Findings include no IRF, abnormal foveal contour, pigment epithelial detachment.   Notes OS, much improved now and stable at 10-week follow-up.,  Much less subretinal fluid on this visit today.  Vision limited by subretinal hyper reflective material and vascularized fibrotic PED, will repeat injection today of Eylea maintain 10-week follow-up interval               ASSESSMENT/PLAN:  Exudative age-related macular degeneration of left eye with active choroidal neovascularization (HCC) OS, at 10-week 5-day interval, improved subfoveal fluid, no intraretinal fluid.  On Eylea.  We will repeat injection today  Early stage nonexudative age-related macular degeneration of right eye No sign of CNVM OD on examination today     ICD-10-CM   1. Exudative age-related macular degeneration of left eye with active choroidal neovascularization (HCC)  H35.3221 Intravitreal Injection, Pharmacologic Agent - OS - Left Eye    OCT, Retina - OU - Both Eyes    aflibercept (  EYLEA) SOLN 2 mg    2. Early stage nonexudative age-related macular degeneration of right eye  H35.3111       1.  Intermediate and early AMD OD, no signs of CNVM today  2.  OS remained stable at 10.5-week interval, on Eylea.  No subretinal fluid reaccumulation.  Vascularized subfoveal PED and subretinal hyper reflective material with atrophy limits acuity.  Stable overall repeat injection today and maintain interval exam  3.  Ophthalmic Meds Ordered this visit:  Meds ordered this encounter  Medications   aflibercept (EYLEA)  SOLN 2 mg       Return in about 11 weeks (around 02/20/2022) for DILATE OU, COLOR FP, EYLEA OCT, OS.  There are no Patient Instructions on file for this visit.   Explained the diagnoses, plan, and follow up with the patient and they expressed understanding.  Patient expressed understanding of the importance of proper follow up care.   Clent Demark Caydance Kuehnle M.D. Diseases & Surgery of the Retina and Vitreous [facility] [place] 12/05/21     Abbreviations: M myopia (nearsighted); A astigmatism; H hyperopia (farsighted); P presbyopia; Mrx spectacle prescription;  CTL contact lenses; OD right eye; OS left eye; OU both eyes  XT exotropia; ET esotropia; PEK punctate epithelial keratitis; PEE punctate epithelial erosions; DES dry eye syndrome; MGD meibomian gland dysfunction; ATs artificial tears; PFAT's preservative free artificial tears; [place] nuclear sclerotic cataract; PSC posterior subcapsular cataract; ERM epi-retinal membrane; PVD posterior vitreous detachment; RD retinal detachment; DM diabetes mellitus; DR diabetic retinopathy; NPDR non-proliferative diabetic retinopathy; PDR proliferative diabetic retinopathy; CSME clinically significant macular edema; DME diabetic macular edema; dbh dot blot hemorrhages; CWS cotton wool spot; POAG primary open angle glaucoma; C/D cup-to-disc ratio; HVF humphrey visual field; GVF goldmann visual field; OCT optical coherence tomography; IOP intraocular pressure; BRVO Branch retinal vein occlusion; CRVO central retinal vein occlusion; CRAO central retinal artery occlusion; BRAO branch retinal artery occlusion; RT retinal tear; SB scleral buckle; PPV pars plana vitrectomy; VH Vitreous hemorrhage; PRP panretinal laser photocoagulation; IVK intravitreal kenalog; VMT vitreomacular traction; MH Macular hole;  NVD neovascularization of the disc; NVE neovascularization elsewhere; AREDS age related eye disease study; ARMD age related macular degeneration; POAG primary  open angle glaucoma; EBMD epithelial/anterior basement membrane dystrophy; ACIOL anterior chamber intraocular lens; IOL intraocular lens; PCIOL posterior chamber intraocular lens; Phaco/IOL phacoemulsification with intraocular lens placement; [place] photorefractive keratectomy; LASIK laser assisted in situ keratomileusis; HTN hypertension; DM diabetes mellitus; COPD chronic obstructive pulmonary disease

## 2021-12-05 NOTE — Assessment & Plan Note (Signed)
OS, at 10-week 5-day interval, improved subfoveal fluid, no intraretinal fluid.  On Eylea.  We will repeat injection today

## 2021-12-06 NOTE — Progress Notes (Signed)
[place] [phone]   Fax:([phone] [place] NOTE  Patient Care Team: [name], MD as PCP - General Elouise Munroe, MD as PCP - Cardiology (Cardiology) Carol Ada, MD as Consulting Physician (Gastroenterology) Martinique, Amy, MD as Consulting Physician (Dermatology) Alda Berthold, DO as Consulting Physician (Neurology) [name], MD as Consulting Physician (Physical Medicine and Rehabilitation) [name], MD as Consulting Physician (Orthopedic Surgery) Szabat, Darnelle Maffucci, [facility] as Pharmacist (Pharmacist)   CHIEF COMPLAINTS/PURPOSE OF CONSULTATION:  Pancytopenia  HISTORY OF PRESENTING ILLNESS:  [name] 81 y.o. male with medical history significant for melanoma, vitamin B12 deficiency, restless leg syndrome, TIA, benign prostatic hyperplasia, DJD and GERD.   On review of the previous records, Mr. Haroon has evidence of normocytic anemia for several years that has progressively worsened. Recently in the last year, there is evidence of leukopenia and thrombocytopenia. The most recent labs from 11/18/2021 showed WBC 2.4, ANC 1.1, Hgb 9.7, MCV 84.0, Plt 58.  On exam today, Mr. Sheeler reports that he does feel tired often but continues to complete all his daily activities on his own.  He is the primary caretaker for his wife who requires to be in a wheelchair.  His appetite is unchanged and he denies any recent weight loss.  He uses a cane to ambulate as he feels unsteady when he walks.  He denies any nausea, vomiting or abdominal pain.  His bowel habits are unchanged without any diarrhea or constipation.  He does notice bruising but denies any active bleeding.  He has chronic neuropathy involving his feet that has been present for several years.  He takes gabapentin to help with the neuropathy.  Patient has history of vitamin B12 deficiency and is currently on B12 oral supplementation.  He denies any fevers,  chills, night sweats, shortness of breath, chest pain or cough.  He has no other complaints.  Rest of the 10 point ROS is below.  MEDICAL HISTORY:  Past Medical History:  Diagnosis Date   Alcoholism ([place])    Dry 13 years   Basal cell carcinoma    Right Chest   BPH (benign prostatic hyperplasia)    Diverticulosis of colon    Dizzy spells    none recent   DJD (degenerative joint disease)    lower back   Elbow fracture, left 2009   [name]   GERD (gastroesophageal reflux disease)    Glaucoma    History of TIAs 1 yrs ago   Hyperkeratosis    LBP (low back pain)    Macular degeneration    left wet right dry sees [name]   Melanoma [facility]) 2009   x3 [name]   Osteoarthritis    Restless leg syndrome    Thrombocytopenia ([place]) 11/18/2021   Tinnitus    Tobacco abuse    Uses walker    Wears glasses    reading   Wears partial dentures    upper    SURGICAL HISTORY: Past Surgical History:  Procedure Laterality Date   CATARACT EXTRACTION Bilateral 2018   HARDWARE REMOVAL Left 07/09/2020   Procedure: Left elbow hardware removal;  Surgeon: [name], MD;  [facility]: [facility];  Service: Orthopedics;  Laterality: Left;  60 mins   mda     Dr. [name] wet injection   MELANOMA EXCISION  2009   x3   RECONSTRUCTION MEDIAL COLLATERAL LIGAMENT ELBOW W/ TENDON GRAFT  2009   Left,  [name]   TONSILLECTOMY  as child   TOTAL KNEE ARTHROPLASTY Right 11/09/2020   Procedure: TOTAL KNEE ARTHROPLASTY;  Surgeon: [name], MD;  Location: [facility];  Service: Orthopedics;  Laterality: Right;    SOCIAL HISTORY: Social History   Socioeconomic History   Marital status: Married    Spouse name: Not on file   Number of children: 2   Years of education: Not on file   Highest education level: Not on file  Occupational History   Occupation: [place] Veteran - [employer]    Employer: RETIRED   Occupation: Chiropodist   Occupation: Heritage manager   Tobacco Use   Smoking status: Former    Packs/day: 3.00    Years: 38.00    Pack years: 114.00    Types: Cigarettes    Quit date: 1986    Years since quitting: 37.4   Smokeless tobacco: Never   Tobacco comments:    states he quit May 15 1985  Vaping Use   Vaping Use: Never used  Substance and Sexual Activity   Alcohol use: No    Comment: PREVIOUS - DRY 29yr   Drug use: Not Currently   Sexual activity: Not Currently  Other Topics Concern   Not on file  Social History Narrative   Right handed   One story home   Drinks coffee q am   Social Determinants of Health   Financial Resource Strain: Low Risk    Difficulty of Paying Living Expenses: Not hard at all  Food Insecurity: No Food Insecurity   Worried About RCharity fundraiserin the Last Year: Never true   RDeWittin the Last Year: Never true  Transportation Needs: No Transportation Needs   Lack of Transportation (Medical): No   Lack of Transportation (Non-Medical): No  Physical Activity: Insufficiently Active   Days of Exercise per Week: 3 days   Minutes of Exercise per Session: 30 min  Stress: No Stress Concern Present   Feeling of Stress : Not at all  Social Connections: Moderately Isolated   Frequency of Communication with Friends and Family: Twice a week   Frequency of Social Gatherings with Friends and Family: Twice a week   Attends Religious Services: Never   AMarine scientistor Organizations: No   Attends CMusic therapist Never   Marital Status: Married  IHuman resources officerViolence: Not At Risk   Fear of Current or Ex-Partner: No   Emotionally Abused: No   Physically Abused: No   Sexually Abused: No    FAMILY HISTORY: Family History  Problem Relation Age of Onset   Cancer Mother        ?breast   Cancer Father        Lung    ALLERGIES:  is allergic to levofloxacin.  MEDICATIONS:  Current Outpatient Medications  Medication Sig Dispense Refill   Acetaminophen (TYLENOL  PO) Tylenol     calcium carbonate (TUMS EX) 750 MG chewable tablet Chew 1 tablet by mouth daily.     carbidopa-levodopa (SINEMET IR) 25-100 MG tablet For breakthrough restless leg, you can take 1 tablet at bedtime.  OK to take extra tab as needed during the day. (Patient taking differently: Take 1 tablet by mouth at bedtime.) 100 tablet 3   Cholecalciferol 1000 UNITS tablet Take 3,000 Units by mouth daily.     Cyanocobalamin (VITAMIN B-12) 1000 MCG SUBL DISSOLVE 2 TABLETS IN MOUTH EVERY DAY 180 tablet 1  ferrous sulfate 325 (65 FE) MG tablet Take 650 mg by mouth daily with breakfast.     finasteride (PROSCAR) 5 MG tablet Take 1 tablet (5 mg total) by mouth daily. 90 tablet 1   gabapentin (NEURONTIN) 300 MG capsule Take 1 capsule (300 mg total) by mouth 4 (four) times daily. 360 capsule 1   ibuprofen (ADVIL) 200 MG tablet Take 400 mg by mouth daily as needed.     magnesium hydroxide (MILK OF MAGNESIA) 400 MG/5ML suspension Take 15 mLs by mouth daily as needed for mild constipation.     meclizine (ANTIVERT) 12.5 MG tablet TAKE 1 TABLET BY MOUTH 3 TIMES DAILY AS NEEDED FOR DIZZINESS. 60 tablet 2   Multiple Vitamins-Minerals (PRESERVISION AREDS PO) Take 2 tablets by mouth 2 (two) times daily.     Oxycodone HCl 10 MG TABS Take 10 mg by mouth 5 (five) times daily as needed (pain). Typically only uses 4 times daily     Polyethyl Glycol-Propyl Glycol (SYSTANE OP) Place 1 drop into both eyes 2 (two) times daily as needed (dry eyes). [facility] O[facility]     pravastatin (PRAVACHOL) 20 MG tablet Take 1 tablet (20 mg total) by mouth daily. 90 tablet 1   rOPINIRole (REQUIP) 2 MG tablet TAKE 1 TABLET BY MOUTH AT 12 PM 1 TABLET AT 4 PM AND 1 TABLETS AT BEDTIME 360 tablet 1   tamsulosin (FLOMAX) 0.4 MG CAPS capsule Take 1 capsule (0.4 mg total) by mouth in the morning. 90 capsule 1   terazosin (HYTRIN) 2 MG capsule TAKE 1 CAPSULE BY MOUTH EVERYDAY AT BEDTIME 90 capsule 1   No current facility-administered medications for  this visit.    REVIEW OF SYSTEMS:   Constitutional: ( - ) fevers, ( - )  chills , ( - ) night sweats Eyes: ( - ) blurriness of vision, ( - ) double vision, ( - ) watery eyes Ears, nose, mouth, throat, and face: ( - ) mucositis, ( - ) sore throat Respiratory: ( - ) cough, ( - ) dyspnea, ( - ) wheezes Cardiovascular: ( - ) palpitation, ( - ) chest discomfort, ( - ) lower extremity swelling Gastrointestinal:  ( - ) nausea, ( - ) heartburn, ( - ) change in bowel habits Skin: ( - ) abnormal skin rashes Lymphatics: ( - ) new lymphadenopathy, ( + ) easy bruising Neurological: ( + ) numbness, ( - ) tingling, ( - ) new weaknesses Behavioral/Psych: ( - ) mood change, ( - ) new changes  All other systems were reviewed with the patient and are negative.  PHYSICAL EXAMINATION: ECOG PERFORMANCE STATUS: 1 - Symptomatic but completely ambulatory  Vitals:   12/07/21 0900  BP: (!) 148/70  Pulse: 62  Resp: 18  Temp: 98 F (36.7 C)  SpO2: 100%   Filed Weights   12/07/21 0900  Weight: 174 lb 14.4 oz (79.3 kg)    GENERAL: well appearing male in NAD  SKIN: skin color, texture, turgor are normal, no rashes or significant lesions EYES: conjunctiva are pink and non-injected, sclera clear OROPHARYNX: no exudate, no erythema; lips, buccal mucosa, and tongue normal  NECK: supple, non-tender LYMPH:  no palpable lymphadenopathy in the cervical or supraclavicular lymph nodes.  LUNGS: clear to auscultation and percussion with normal breathing effort HEART: regular rate & rhythm and no murmurs and no lower extremity edema ABDOMEN: soft, non-tender, non-distended, normal bowel sounds Musculoskeletal: no cyanosis of digits and no clubbing  PSYCH: alert & oriented x 3,  fluent speech NEURO: no focal motor/sensory deficits  LABORATORY DATA:  I have reviewed the data as listed    Latest Ref Rng & Units 11/18/2021   10:30 AM 11/10/2020    3:30 AM 10/28/2020   10:45 AM  CBC  WBC 4.0 - 10.5 K/uL 2.4  Repeated and verified X2.   4.8   3.0    Hemoglobin 13.0 - 17.0 g/dL 9.7   7.9   9.0    Hematocrit 39.0 - 52.0 % 27.9   24.7   27.4    Platelets 150.0 - 400.0 K/uL 58.0   112   137         Latest Ref Rng & Units 11/18/2021   10:30 AM 04/19/2021   10:59 AM 11/10/2020    3:30 AM  CMP  Glucose 70 - 99 mg/dL 82   89   183    BUN 6 - 23 mg/dL _0 Creatinine 0.40 - 1.50 mg/dL 1.17   1.05   1.06    Sodium 135 - 145 mEq/L 139   139   137    Potassium 3.5 - 5.1 mEq/L 4.2   4.0   4.2    Chloride 96 - 112 mEq/L 103   104   105    CO2 19 - 32 mEq/L _1 Calcium 8.4 - 10.5 mg/dL 9.9   9.3   8.8    Total Protein 6.0 - 8.3 g/dL 7.1   7.1     Total Bilirubin 0.2 - 1.2 mg/dL 0.7   0.7     Alkaline Phos 39 - 117 U/L 56   55     AST 0 - 37 U/L 17   15     ALT 0 - 53 U/L 5   5       RADIOGRAPHIC STUDIES: I have personally reviewed the radiological images as listed and agreed with the findings in the report. Intravitreal Injection, Pharmacologic Agent - OS - Left Eye  Result Date: 12/05/2021 Time Out 12/05/2021. 8:56 AM. Confirmed correct patient, procedure, site, and patient consented. Anesthesia Topical anesthesia was used. Anesthetic medications included Lidocaine 4%. Procedure Preparation included Tobramycin 0.3%, 5% betadine to ocular surface, 10% betadine to eyelids. A 30 gauge needle was used. Injection: 2 mg aflibercept 2 MG/0.05ML   Route: Intravitreal, Site: Left Eye   NDC: [id number], Lot: [id number], Waste: 0 mL Post-op Post injection exam found visual acuity of at least counting fingers. The patient tolerated the procedure well. There were no complications. The patient received written and verbal post procedure care education. Post injection medications included gentamicin.   OCT, Retina - OU - Both Eyes  Result Date: 12/05/2021 Right Eye Quality was good. Scan locations included subfoveal. Central Foveal Thickness: 290. Progression has been stable. Findings include  normal foveal contour, retinal drusen , no IRF, no SRF. Left Eye Quality was good. Scan locations included subfoveal. Central Foveal Thickness: 221. Progression has been stable. Findings include no IRF, abnormal foveal contour, pigment epithelial detachment. Notes OS, much improved now and stable at 10-week follow-up.,  Much less subretinal fluid on this visit today.  Vision limited by subretinal hyper reflective material and vascularized fibrotic PED, will repeat injection today of Eylea maintain 10-week follow-up interval   ASSESSMENT & [place]. Is a 81 y.o. male who presents to the clinic for evaluation for pancytopenia.  We reviewed  possible etiologies including nutritional deficiencies, viral infections, liver disease and bone marrow disorders.  Patient is currently taking vitamin B12 oral supplementation due to history of B12 deficiency.  We will proceed with serologic work-up today to check CBC, CMP, iron panel, vitamin B12 and methylmalonic acid levels, folate level, hepatitis B and C serologies, SPEP with immunofixation, serum free light chains and safe smear.  If today's labs are unremarkable for underlying cause, we will recommend a bone marrow biopsy to further evaluate for bone marrow disorders.  #Pancytopenia:  --Labs today to check CBC, CMP, iron panel, vitamin B12 and methylmalonic acid levels, folate level, hepatitis B and C serologies, SPEP with immunofixation, serum free light chains and safe smear --Will proceed with bone marrow biopsy if above workup is negative --Strict precautions for bleeding was given --RTC will be based on above workup.   No orders of the defined types were placed in this encounter.   All questions were answered. The patient knows to call the clinic with any problems, questions or concerns.  I have spent a total of 60 minutes minutes of face-to-face and non-face-to-face time, preparing to see the patient, obtaining and/or reviewing  separately obtained history, performing a medically appropriate examination, counseling and educating the patient, ordering tests/procedures,documenting clinical information in the electronic health record, and care coordination.   Dede Query, PA-C Department of Hematology/Oncology [place] at [facility] Phone: [phone]  Patient was seen with Dr. Lorenso Courier  I have read the above note and personally examined the patient. I agree with the assessment and plan as noted above.  Briefly Mr. Gill Delrossi is an 82 year old male who presents for evaluation of pancytopenia.  On last check on 11/18/2021 patient white blood cell count 2.4, hemoglobin 9.7, and platelets of 58.  Due to concern for this pancytopenia we will conduct a full work-up to include nutritional studies, hep B/C rule out, and SPEP/serum free light chains.  In the event no clear etiology can be found we will need to pursue a bone marrow biopsy.  The patient voiced understanding of the plan moving forward.   Ledell Peoples, MD Department of Hematology/Oncology [place] at [facility] Phone: [phone] Pager: [phone] Email: [email or web address]

## 2021-12-07 ENCOUNTER — Encounter: Payer: Self-pay | Admitting: Physician Assistant

## 2021-12-07 ENCOUNTER — Inpatient Hospital Stay: Payer: PPO

## 2021-12-07 ENCOUNTER — Other Ambulatory Visit: Payer: Self-pay

## 2021-12-07 ENCOUNTER — Inpatient Hospital Stay: Payer: PPO | Attending: Physician Assistant | Admitting: Physician Assistant

## 2021-12-07 VITALS — BP 148/70 | HR 62 | Temp 98.0°F | Resp 18 | Ht 68.5 in | Wt 174.9 lb

## 2021-12-07 DIAGNOSIS — Z85820 Personal history of malignant melanoma of skin: Secondary | ICD-10-CM | POA: Insufficient documentation

## 2021-12-07 DIAGNOSIS — E538 Deficiency of other specified B group vitamins: Secondary | ICD-10-CM | POA: Insufficient documentation

## 2021-12-07 DIAGNOSIS — D649 Anemia, unspecified: Secondary | ICD-10-CM

## 2021-12-07 DIAGNOSIS — Z79899 Other long term (current) drug therapy: Secondary | ICD-10-CM | POA: Insufficient documentation

## 2021-12-07 DIAGNOSIS — D469 Myelodysplastic syndrome, unspecified: Secondary | ICD-10-CM | POA: Diagnosis not present

## 2021-12-07 DIAGNOSIS — D61818 Other pancytopenia: Secondary | ICD-10-CM | POA: Diagnosis not present

## 2021-12-07 DIAGNOSIS — K219 Gastro-esophageal reflux disease without esophagitis: Secondary | ICD-10-CM | POA: Insufficient documentation

## 2021-12-07 DIAGNOSIS — G629 Polyneuropathy, unspecified: Secondary | ICD-10-CM | POA: Diagnosis not present

## 2021-12-07 DIAGNOSIS — Z8673 Personal history of transient ischemic attack (TIA), and cerebral infarction without residual deficits: Secondary | ICD-10-CM | POA: Diagnosis not present

## 2021-12-07 DIAGNOSIS — Z87891 Personal history of nicotine dependence: Secondary | ICD-10-CM | POA: Diagnosis not present

## 2021-12-07 DIAGNOSIS — Z801 Family history of malignant neoplasm of trachea, bronchus and lung: Secondary | ICD-10-CM | POA: Diagnosis not present

## 2021-12-07 LAB — HEPATITIS C ANTIBODY: HCV Ab: NONREACTIVE

## 2021-12-07 LAB — CMP (CANCER CENTER ONLY)
ALT: 6 U/L (ref 0–44)
AST: 17 U/L (ref 15–41)
Albumin: 4.2 g/dL (ref 3.5–5.0)
Alkaline Phosphatase: 59 U/L (ref 38–126)
Anion gap: 4 — ABNORMAL LOW (ref 5–15)
BUN: 21 mg/dL (ref 8–23)
CO2: 28 mmol/L (ref 22–32)
Calcium: 9.1 mg/dL (ref 8.9–10.3)
Chloride: 108 mmol/L (ref 98–111)
Creatinine: 1.01 mg/dL (ref 0.61–1.24)
GFR, Estimated: >60 mL/min (ref >60)
Glucose, Bld: 97 mg/dL (ref 70–99)
Potassium: 3.9 mmol/L (ref 3.5–5.1)
Sodium: 140 mmol/L (ref 135–145)
Total Bilirubin: 0.6 mg/dL (ref 0.3–1.2)
Total Protein: 7 g/dL (ref 6.5–8.1)

## 2021-12-07 LAB — SAMPLE TO BLOOD BANK

## 2021-12-07 LAB — RETIC PANEL
Immature Retic Fract: 8 % (ref 2.3–15.9)
RBC.: 3.13 MIL/uL — ABNORMAL LOW (ref 4.22–5.81)
Retic Count, Absolute: 69.8 10*3/uL (ref 19.0–186.0)
Retic Ct Pct: 2.2 % (ref 0.4–3.1)
Reticulocyte Hemoglobin: 30.2 pg (ref >27.9)

## 2021-12-07 LAB — CBC WITH DIFFERENTIAL (CANCER CENTER ONLY)
Abs Immature Granulocytes: 0.01 10*3/uL (ref 0.00–0.07)
Basophils Absolute: 0 10*3/uL (ref 0.0–0.1)
Basophils Relative: 0 %
Eosinophils Absolute: 0 10*3/uL (ref 0.0–0.5)
Eosinophils Relative: 1 %
HCT: 27.2 % — ABNORMAL LOW (ref 39.0–52.0)
Hemoglobin: 9 g/dL — ABNORMAL LOW (ref 13.0–17.0)
Immature Granulocytes: 0 %
Lymphocytes Relative: 47 %
Lymphs Abs: 1 10*3/uL (ref 0.7–4.0)
MCH: 29 pg (ref 26.0–34.0)
MCHC: 33.1 g/dL (ref 30.0–36.0)
MCV: 87.7 fL (ref 80.0–100.0)
Monocytes Absolute: 0.3 10*3/uL (ref 0.1–1.0)
Monocytes Relative: 14 %
Neutro Abs: 0.9 10*3/uL — ABNORMAL LOW (ref 1.7–7.7)
Neutrophils Relative %: 38 %
Platelet Count: 59 10*3/uL — ABNORMAL LOW (ref 150–400)
RBC: 3.1 MIL/uL — ABNORMAL LOW (ref 4.22–5.81)
RDW: 14.5 % (ref 11.5–15.5)
WBC Count: 2.3 10*3/uL — ABNORMAL LOW (ref 4.0–10.5)
nRBC: 0 % (ref 0.0–0.2)

## 2021-12-07 LAB — HEPATITIS B CORE ANTIBODY, TOTAL: Hep B Core Total Ab: NONREACTIVE

## 2021-12-07 LAB — IRON AND IRON BINDING CAPACITY (CC-WL,HP ONLY)
Iron: 76 ug/dL (ref 45–182)
Saturation Ratios: 26 % (ref 17.9–39.5)
TIBC: 290 ug/dL (ref 250–450)
UIBC: 214 ug/dL (ref 117–376)

## 2021-12-07 LAB — VITAMIN B12: Vitamin B-12: 1331 pg/mL — ABNORMAL HIGH (ref 180–914)

## 2021-12-07 LAB — FOLATE: Folate: 9.8 ng/mL (ref >5.9)

## 2021-12-07 LAB — HEPATITIS B SURFACE ANTIGEN: Hepatitis B Surface Ag: NONREACTIVE

## 2021-12-07 LAB — SAVE SMEAR(SSMR), FOR PROVIDER SLIDE REVIEW

## 2021-12-07 LAB — FERRITIN: Ferritin: 85 ng/mL (ref 24–336)

## 2021-12-07 LAB — HEPATITIS B SURFACE ANTIBODY,QUALITATIVE: Hep B S Ab: NONREACTIVE

## 2021-12-08 LAB — KAPPA/LAMBDA LIGHT CHAINS
Kappa free light chain: 45.3 mg/L — ABNORMAL HIGH (ref 3.3–19.4)
Kappa, lambda light chain ratio: 1.57 (ref 0.26–1.65)
Lambda free light chains: 28.9 mg/L — ABNORMAL HIGH (ref 5.7–26.3)

## 2021-12-09 LAB — METHYLMALONIC ACID, SERUM: Methylmalonic Acid, Quantitative: 134 nmol/L (ref 0–378)

## 2021-12-12 DIAGNOSIS — M5416 Radiculopathy, lumbar region: Secondary | ICD-10-CM | POA: Diagnosis not present

## 2021-12-12 DIAGNOSIS — Z79891 Long term (current) use of opiate analgesic: Secondary | ICD-10-CM | POA: Diagnosis not present

## 2021-12-12 DIAGNOSIS — Z5181 Encounter for therapeutic drug level monitoring: Secondary | ICD-10-CM | POA: Diagnosis not present

## 2021-12-12 LAB — MULTIPLE MYELOMA PANEL, SERUM
Albumin SerPl Elph-Mcnc: 3.9 g/dL (ref 2.9–4.4)
Albumin/Glob SerPl: 1.5 (ref 0.7–1.7)
Alpha 1: 0.3 g/dL (ref 0.0–0.4)
Alpha2 Glob SerPl Elph-Mcnc: 0.5 g/dL (ref 0.4–1.0)
B-Globulin SerPl Elph-Mcnc: 0.8 g/dL (ref 0.7–1.3)
Gamma Glob SerPl Elph-Mcnc: 1.1 g/dL (ref 0.4–1.8)
Globulin, Total: 2.7 g/dL (ref 2.2–3.9)
IgA: 287 mg/dL (ref 61–437)
IgG (Immunoglobin G), Serum: 1008 mg/dL (ref 603–1613)
IgM (Immunoglobulin M), Srm: 79 mg/dL (ref 15–143)
Total Protein ELP: 6.6 g/dL (ref 6.0–8.5)

## 2021-12-13 ENCOUNTER — Telehealth: Payer: Self-pay | Admitting: Physician Assistant

## 2021-12-13 NOTE — Telephone Encounter (Signed)
I called [name] to review the lab results from 12/07/2021. Findings show persistent pancytopenia. There is no evidence of nutritional deficiencies, hepatitis B or C, paraproteinemia. Recommend to proceed with bone marrow biopsy. We will schedule follow up with Dr. Lorenso Courier 3-5 days after bone marrow biopsy to review results and recommendations. Patient expressed understanding and satisfaction with the plan provided.

## 2021-12-14 ENCOUNTER — Other Ambulatory Visit: Payer: Self-pay

## 2021-12-14 ENCOUNTER — Telehealth: Payer: Self-pay

## 2021-12-14 DIAGNOSIS — D61818 Other pancytopenia: Secondary | ICD-10-CM

## 2021-12-14 MED ORDER — LIDOCAINE HCL 2 % IJ SOLN: 20.0000 mL | Freq: Once | INTRAMUSCULAR | Status: DC

## 2021-12-14 NOTE — Telephone Encounter (Signed)
Called pt to discuss BMBX and to get scheduled. Pt is aware of procedure & details; knows to arrive at [facility] for check in. Flowtometry aware and scheduled, OK per [employer] for infusion room accommodation. Message sent to scheduling. Orders enter for labs and lidocaine per NP.

## 2021-12-15 ENCOUNTER — Telehealth: Payer: Self-pay | Admitting: Hematology and Oncology

## 2021-12-15 NOTE — Telephone Encounter (Signed)
Scheduled per 6/15 los, pt has been called and confirmed

## 2021-12-19 ENCOUNTER — Other Ambulatory Visit: Payer: Self-pay

## 2021-12-19 ENCOUNTER — Inpatient Hospital Stay: Payer: PPO

## 2021-12-19 ENCOUNTER — Inpatient Hospital Stay (HOSPITAL_BASED_OUTPATIENT_CLINIC_OR_DEPARTMENT_OTHER): Payer: PPO | Admitting: Adult Health

## 2021-12-19 VITALS — BP 148/72 | HR 68 | Temp 98.2°F | Resp 18

## 2021-12-19 DIAGNOSIS — D61818 Other pancytopenia: Secondary | ICD-10-CM

## 2021-12-19 LAB — CBC WITH DIFFERENTIAL (CANCER CENTER ONLY)
Abs Immature Granulocytes: 0.03 10*3/uL (ref 0.00–0.07)
Basophils Absolute: 0 10*3/uL (ref 0.0–0.1)
Basophils Relative: 1 %
Eosinophils Absolute: 0 10*3/uL (ref 0.0–0.5)
Eosinophils Relative: 1 %
HCT: 25.6 % — ABNORMAL LOW (ref 39.0–52.0)
Hemoglobin: 8.7 g/dL — ABNORMAL LOW (ref 13.0–17.0)
Immature Granulocytes: 1 %
Lymphocytes Relative: 40 %
Lymphs Abs: 1.2 10*3/uL (ref 0.7–4.0)
MCH: 29.2 pg (ref 26.0–34.0)
MCHC: 34 g/dL (ref 30.0–36.0)
MCV: 85.9 fL (ref 80.0–100.0)
Monocytes Absolute: 0.7 10*3/uL (ref 0.1–1.0)
Monocytes Relative: 23 %
Neutro Abs: 1 10*3/uL — ABNORMAL LOW (ref 1.7–7.7)
Neutrophils Relative %: 34 %
Platelet Count: 50 10*3/uL — ABNORMAL LOW (ref 150–400)
RBC: 2.98 MIL/uL — ABNORMAL LOW (ref 4.22–5.81)
RDW: 14.7 % (ref 11.5–15.5)
WBC Count: 3 10*3/uL — ABNORMAL LOW (ref 4.0–10.5)
nRBC: 0 % (ref 0.0–0.2)

## 2021-12-19 MED ORDER — LIDOCAINE HCL 2 % IJ SOLN
20.0000 mL | Freq: Once | INTRAMUSCULAR | Status: DC
  Filled 2021-12-19: qty 20

## 2021-12-19 NOTE — Progress Notes (Signed)
INDICATION: pancytopenia ° °Brief examination was performed. °ENT: adequate airway clearance °Heart: regular rate and rhythm.No Murmurs °Lungs: clear to auscultation, no wheezes, normal respiratory effort ° °Bone Marrow Biopsy and Aspiration Procedure Note  ° °Informed consent was obtained and potential risks including bleeding, infection and pain were reviewed with the patient. ° °The patient's name, date of birth, identification, consent and allergies were verified prior to the start of procedure and time out was performed. ° °The right posterior iliac crest was chosen as the site of biopsy. ° °The skin was prepped with ChloraPrep.  ° °8 cc of 2% lidocaine was used to provide local anaesthesia.  ° °10 cc of bone marrow aspirate was obtained followed by 1cm biopsy.  °Pressure was applied to the biopsy site and bandage was placed over the biopsy site. °Patient was made to lie on the back for 30 mins prior to discharge. ° °The procedure was tolerated well. °COMPLICATIONS: None °BLOOD LOSS: none °The patient was discharged home in stable condition with a 1 week follow up to review results.  Patient was provided with post bone marrow biopsy instructions and instructed to call if there was any bleeding or worsening pain. ° °Specimens sent for flow cytometry, cytogenetics and additional studies. ° °Signed °Layza Summa C Aydien Majette, NP ° °

## 2021-12-19 NOTE — Patient Instructions (Signed)
Bone Marrow Aspiration and Bone Marrow Biopsy, Adult, Care After This sheet gives you information about how to care for yourself after your procedure. Your health care provider may also give you more specific instructions. If you have problems or questions, contact your health care provider. What can I expect after the procedure? After the procedure, it is common to have: Mild pain and tenderness. Swelling. Bruising. Follow these instructions at home: Puncture site care  Follow instructions from your health care provider about how to take care of the puncture site. Make sure you: Wash your hands with soap and water before and after you change your bandage (dressing). If soap and water are not available, use hand sanitizer. Change your dressing as told by your health care provider. Check your puncture site every day for signs of infection. Check for: More redness, swelling, or pain. Fluid or blood. Warmth. Pus or a bad smell. Activity Return to your normal activities as told by your health care provider. Ask your health care provider what activities are safe for you. Do not lift anything that is heavier than 10 lb (4.5 kg), or the limit that you are told, until your health care provider says that it is safe. Do not drive for 24 hours if you were given a sedative during your procedure. General instructions  Take over-the-counter and prescription medicines only as told by your health care provider. Do not take baths, swim, or use a hot tub until your health care provider approves. Ask your health care provider if you may take showers. You may only be allowed to take sponge baths. If directed, put ice on the affected area. To do this: Put ice in a plastic bag. Place a towel between your skin and the bag. Leave the ice on for 20 minutes, 2-3 times a day. Keep all follow-up visits as told by your health care provider. This is important. Contact a health care provider if: Your pain is not  controlled with medicine. You have a fever. You have more redness, swelling, or pain around the puncture site. You have fluid or blood coming from the puncture site. Your puncture site feels warm to the touch. You have pus or a bad smell coming from the puncture site. Summary After the procedure, it is common to have mild pain, tenderness, swelling, and bruising. Follow instructions from your health care provider about how to take care of the puncture site and what activities are safe for you. Take over-the-counter and prescription medicines only as told by your health care provider. Contact a health care provider if you have any signs of infection, such as fluid or blood coming from the puncture site. This information is not intended to replace advice given to you by your health care provider. Make sure you discuss any questions you have with your health care provider. Document Revised: 11/05/2018 Document Reviewed: 11/05/2018 Elsevier Patient Education  [place].

## 2021-12-19 NOTE — Progress Notes (Signed)
Patient waited 30 minutes post observation for his bone marrow biopsy today. He tolerated the treatment well with no issues. VSS upon discharge. Dressing was intact and no bleeding. AVS was reviewed. Patinet voiced understanding.

## 2021-12-20 LAB — SURGICAL PATHOLOGY

## 2021-12-22 ENCOUNTER — Other Ambulatory Visit: Payer: Self-pay

## 2021-12-22 ENCOUNTER — Inpatient Hospital Stay (HOSPITAL_BASED_OUTPATIENT_CLINIC_OR_DEPARTMENT_OTHER): Payer: PPO | Admitting: Hematology and Oncology

## 2021-12-22 ENCOUNTER — Inpatient Hospital Stay: Payer: PPO

## 2021-12-22 VITALS — BP 158/70 | HR 73 | Temp 97.9°F | Resp 18 | Ht 68.5 in | Wt 176.4 lb

## 2021-12-22 DIAGNOSIS — D469 Myelodysplastic syndrome, unspecified: Secondary | ICD-10-CM

## 2021-12-22 DIAGNOSIS — D61818 Other pancytopenia: Secondary | ICD-10-CM

## 2021-12-22 LAB — CBC WITH DIFFERENTIAL (CANCER CENTER ONLY)
Abs Immature Granulocytes: 0.01 10*3/uL (ref 0.00–0.07)
Basophils Absolute: 0 10*3/uL (ref 0.0–0.1)
Basophils Relative: 1 %
Eosinophils Absolute: 0 10*3/uL (ref 0.0–0.5)
Eosinophils Relative: 1 %
HCT: 27.9 % — ABNORMAL LOW (ref 39.0–52.0)
Hemoglobin: 9.2 g/dL — ABNORMAL LOW (ref 13.0–17.0)
Immature Granulocytes: 0 %
Lymphocytes Relative: 28 %
Lymphs Abs: 0.9 10*3/uL (ref 0.7–4.0)
MCH: 28.8 pg (ref 26.0–34.0)
MCHC: 33 g/dL (ref 30.0–36.0)
MCV: 87.2 fL (ref 80.0–100.0)
Monocytes Absolute: 0.5 10*3/uL (ref 0.1–1.0)
Monocytes Relative: 16 %
Neutro Abs: 1.7 10*3/uL (ref 1.7–7.7)
Neutrophils Relative %: 54 %
Platelet Count: 47 10*3/uL — ABNORMAL LOW (ref 150–400)
RBC: 3.2 MIL/uL — ABNORMAL LOW (ref 4.22–5.81)
RDW: 15.1 % (ref 11.5–15.5)
WBC Count: 3.2 10*3/uL — ABNORMAL LOW (ref 4.0–10.5)
nRBC: 0 % (ref 0.0–0.2)

## 2021-12-23 LAB — ERYTHROPOIETIN: Erythropoietin: 30.9 m[IU]/mL — ABNORMAL HIGH (ref 2.6–18.5)

## 2021-12-25 ENCOUNTER — Encounter: Payer: Self-pay | Admitting: Hematology and Oncology

## 2021-12-25 DIAGNOSIS — D469 Myelodysplastic syndrome, unspecified: Secondary | ICD-10-CM | POA: Insufficient documentation

## 2021-12-26 ENCOUNTER — Encounter (HOSPITAL_COMMUNITY): Payer: Self-pay

## 2021-12-28 ENCOUNTER — Encounter (HOSPITAL_COMMUNITY): Payer: Self-pay

## 2021-12-28 ENCOUNTER — Inpatient Hospital Stay: Payer: PPO

## 2021-12-28 ENCOUNTER — Other Ambulatory Visit: Payer: Self-pay | Admitting: *Deleted

## 2021-12-28 ENCOUNTER — Other Ambulatory Visit: Payer: Self-pay

## 2021-12-28 VITALS — BP 158/60 | HR 68 | Temp 98.2°F | Resp 18

## 2021-12-28 DIAGNOSIS — D469 Myelodysplastic syndrome, unspecified: Secondary | ICD-10-CM

## 2021-12-28 DIAGNOSIS — D61818 Other pancytopenia: Secondary | ICD-10-CM | POA: Diagnosis not present

## 2021-12-28 LAB — CBC WITH DIFFERENTIAL (CANCER CENTER ONLY)
Abs Immature Granulocytes: 0.02 10*3/uL (ref 0.00–0.07)
Basophils Absolute: 0 10*3/uL (ref 0.0–0.1)
Basophils Relative: 0 %
Eosinophils Absolute: 0.1 10*3/uL (ref 0.0–0.5)
Eosinophils Relative: 1 %
HCT: 27.2 % — ABNORMAL LOW (ref 39.0–52.0)
Hemoglobin: 9 g/dL — ABNORMAL LOW (ref 13.0–17.0)
Immature Granulocytes: 0 %
Lymphocytes Relative: 24 %
Lymphs Abs: 1.2 10*3/uL (ref 0.7–4.0)
MCH: 28.8 pg (ref 26.0–34.0)
MCHC: 33.1 g/dL (ref 30.0–36.0)
MCV: 87.2 fL (ref 80.0–100.0)
Monocytes Absolute: 0.8 10*3/uL (ref 0.1–1.0)
Monocytes Relative: 16 %
Neutro Abs: 2.8 10*3/uL (ref 1.7–7.7)
Neutrophils Relative %: 59 %
Platelet Count: 53 10*3/uL — ABNORMAL LOW (ref 150–400)
RBC: 3.12 MIL/uL — ABNORMAL LOW (ref 4.22–5.81)
RDW: 15.2 % (ref 11.5–15.5)
WBC Count: 4.8 10*3/uL (ref 4.0–10.5)
nRBC: 0 % (ref 0.0–0.2)

## 2021-12-28 MED ORDER — EPOETIN ALFA-EPBX 20000 UNIT/ML IJ SOLN
20000.0000 [IU] | Freq: Once | INTRAMUSCULAR | Status: AC
  Administered 2021-12-28: 20000 [IU] via SUBCUTANEOUS
  Filled 2021-12-28: qty 1

## 2021-12-28 NOTE — Patient Instructions (Signed)
Epoetin Alfa injection ?What is this medication? ?EPOETIN ALFA (e POE e tin AL fa) helps your body make more red blood cells. This medicine is used to treat anemia caused by chronic kidney disease, cancer chemotherapy, or HIV-therapy. It may also be used before surgery if you have anemia. ?This medicine may be used for other purposes; ask your health care provider or pharmacist if you have questions. ?COMMON BRAND NAME(S): Epogen, Procrit, Retacrit ?What should I tell my care team before I take this medication? ?They need to know if you have any of these conditions: ?cancer ?heart disease ?high blood pressure ?history of blood clots ?history of stroke ?low levels of folate, iron, or vitamin B12 in the blood ?seizures ?an unusual or allergic reaction to erythropoietin, albumin, benzyl alcohol, hamster proteins, other medicines, foods, dyes, or preservatives ?pregnant or trying to get pregnant ?breast-feeding ?How should I use this medication? ?This medicine is for injection into a vein or under the skin. It is usually given by a health care professional in a hospital or clinic setting. ?If you get this medicine at home, you will be taught how to prepare and give this medicine. Use exactly as directed. Take your medicine at regular intervals. Do not take your medicine more often than directed. ?It is important that you put your used needles and syringes in a special sharps container. Do not put them in a trash can. If you do not have a sharps container, call your pharmacist or healthcare provider to get one. ?A special MedGuide will be given to you by the pharmacist with each prescription and refill. Be sure to read this information carefully each time. ?Talk to your pediatrician regarding the use of this medicine in children. While this drug may be prescribed for selected conditions, precautions do apply. ?Overdosage: If you think you have taken too much of this medicine contact a poison control center or emergency  room at once. ?NOTE: This medicine is only for you. Do not share this medicine with others. ?What if I miss a dose? ?If you miss a dose, take it as soon as you can. If it is almost time for your next dose, take only that dose. Do not take double or extra doses. ?What may interact with this medication? ?Interactions have not been studied. ?This list may not describe all possible interactions. Give your health care provider a list of all the medicines, herbs, non-prescription drugs, or dietary supplements you use. Also tell them if you smoke, drink alcohol, or use illegal drugs. Some items may interact with your medicine. ?What should I watch for while using this medication? ?Your condition will be monitored carefully while you are receiving this medicine. ?You may need blood work done while you are taking this medicine. ?This medicine may cause a decrease in vitamin B6. You should make sure that you get enough vitamin B6 while you are taking this medicine. Discuss the foods you eat and the vitamins you take with your health care professional. ?What side effects may I notice from receiving this medication? ?Side effects that you should report to your doctor or health care professional as soon as possible: ?allergic reactions like skin rash, itching or hives, swelling of the face, lips, or tongue ?seizures ?signs and symptoms of a blood clot such as breathing problems; changes in vision; chest pain; severe, sudden headache; pain, swelling, warmth in the leg; trouble speaking; sudden numbness or weakness of the face, arm or leg ?signs and symptoms of a stroke like   changes in vision; confusion; trouble speaking or understanding; severe headaches; sudden numbness or weakness of the face, arm or leg; trouble walking; dizziness; loss of balance or coordination ?Side effects that usually do not require medical attention (report to your doctor or health care professional if they continue or are  bothersome): ?chills ?cough ?dizziness ?fever ?headaches ?joint pain ?muscle cramps ?muscle pain ?nausea, vomiting ?pain, redness, or irritation at site where injected ?This list may not describe all possible side effects. Call your doctor for medical advice about side effects. You may report side effects to FDA at 1-800-FDA-1088. ?Where should I keep my medication? ?Keep out of the reach of children. ?Store in a refrigerator between 2 and 8 degrees C (36 and 46 degrees F). Do not freeze or shake. Throw away any unused portion if using a single-dose vial. Multi-dose vials can be kept in the refrigerator for up to 21 days after the initial dose. Throw away unused medicine. ?NOTE: This sheet is a summary. It may not cover all possible information. If you have questions about this medicine, talk to your doctor, pharmacist, or health care provider. ?? 2023 Elsevier/Gold Standard (2017-02-20 00:00:00) ? ?

## 2022-01-02 LAB — SURGICAL PATHOLOGY

## 2022-01-11 ENCOUNTER — Other Ambulatory Visit: Payer: Self-pay | Admitting: Hematology and Oncology

## 2022-01-11 ENCOUNTER — Inpatient Hospital Stay: Payer: PPO

## 2022-01-11 ENCOUNTER — Other Ambulatory Visit: Payer: Self-pay

## 2022-01-11 ENCOUNTER — Inpatient Hospital Stay: Payer: PPO | Attending: Physician Assistant

## 2022-01-11 VITALS — BP 163/78 | HR 93 | Temp 97.8°F | Resp 18

## 2022-01-11 DIAGNOSIS — D469 Myelodysplastic syndrome, unspecified: Secondary | ICD-10-CM

## 2022-01-11 LAB — CMP (CANCER CENTER ONLY)
ALT: 6 U/L (ref 0–44)
AST: 18 U/L (ref 15–41)
Albumin: 4 g/dL (ref 3.5–5.0)
Alkaline Phosphatase: 57 U/L (ref 38–126)
Anion gap: 5 (ref 5–15)
BUN: 19 mg/dL (ref 8–23)
CO2: 28 mmol/L (ref 22–32)
Calcium: 9.1 mg/dL (ref 8.9–10.3)
Chloride: 108 mmol/L (ref 98–111)
Creatinine: 1.09 mg/dL (ref 0.61–1.24)
GFR, Estimated: >60 mL/min (ref >60)
Glucose, Bld: 89 mg/dL (ref 70–99)
Potassium: 4.4 mmol/L (ref 3.5–5.1)
Sodium: 141 mmol/L (ref 135–145)
Total Bilirubin: 0.6 mg/dL (ref 0.3–1.2)
Total Protein: 6.7 g/dL (ref 6.5–8.1)

## 2022-01-11 LAB — CBC WITH DIFFERENTIAL (CANCER CENTER ONLY)
Abs Immature Granulocytes: 0.01 10*3/uL (ref 0.00–0.07)
Basophils Absolute: 0 10*3/uL (ref 0.0–0.1)
Basophils Relative: 1 %
Eosinophils Absolute: 0 10*3/uL (ref 0.0–0.5)
Eosinophils Relative: 1 %
HCT: 29.1 % — ABNORMAL LOW (ref 39.0–52.0)
Hemoglobin: 9.4 g/dL — ABNORMAL LOW (ref 13.0–17.0)
Immature Granulocytes: 0 %
Lymphocytes Relative: 32 %
Lymphs Abs: 1.2 10*3/uL (ref 0.7–4.0)
MCH: 28.6 pg (ref 26.0–34.0)
MCHC: 32.3 g/dL (ref 30.0–36.0)
MCV: 88.4 fL (ref 80.0–100.0)
Monocytes Absolute: 0.6 10*3/uL (ref 0.1–1.0)
Monocytes Relative: 17 %
Neutro Abs: 1.9 10*3/uL (ref 1.7–7.7)
Neutrophils Relative %: 49 %
Platelet Count: 52 10*3/uL — ABNORMAL LOW (ref 150–400)
RBC: 3.29 MIL/uL — ABNORMAL LOW (ref 4.22–5.81)
RDW: 15.6 % — ABNORMAL HIGH (ref 11.5–15.5)
WBC Count: 3.9 10*3/uL — ABNORMAL LOW (ref 4.0–10.5)
nRBC: 0 % (ref 0.0–0.2)

## 2022-01-11 MED ORDER — EPOETIN ALFA-EPBX 20000 UNIT/ML IJ SOLN
20000.0000 [IU] | Freq: Once | INTRAMUSCULAR | Status: AC
  Administered 2022-01-11: 20000 [IU] via SUBCUTANEOUS
  Filled 2022-01-11: qty 1

## 2022-01-11 NOTE — Progress Notes (Signed)
LPN Laton rechecked the patient's BP and results are 148/70  Larene Beach, PharmD

## 2022-01-16 ENCOUNTER — Encounter: Payer: Self-pay | Admitting: Internal Medicine

## 2022-01-16 DIAGNOSIS — D485 Neoplasm of uncertain behavior of skin: Secondary | ICD-10-CM | POA: Diagnosis not present

## 2022-01-16 DIAGNOSIS — L98499 Non-pressure chronic ulcer of skin of other sites with unspecified severity: Secondary | ICD-10-CM | POA: Diagnosis not present

## 2022-01-16 DIAGNOSIS — Z85828 Personal history of other malignant neoplasm of skin: Secondary | ICD-10-CM | POA: Diagnosis not present

## 2022-01-23 ENCOUNTER — Other Ambulatory Visit: Payer: Self-pay | Admitting: Pharmacist

## 2022-01-25 ENCOUNTER — Other Ambulatory Visit: Payer: Self-pay | Admitting: *Deleted

## 2022-01-25 MED ORDER — GABAPENTIN 300 MG PO CAPS: 300.0000 mg | ORAL_CAPSULE | Freq: Four times a day (QID) | ORAL | 0 refills | Status: DC

## 2022-01-26 ENCOUNTER — Inpatient Hospital Stay (HOSPITAL_BASED_OUTPATIENT_CLINIC_OR_DEPARTMENT_OTHER): Payer: PPO | Admitting: Hematology and Oncology

## 2022-01-26 ENCOUNTER — Inpatient Hospital Stay: Payer: PPO

## 2022-01-26 VITALS — BP 175/88 | HR 103 | Temp 97.5°F | Resp 17 | Wt 173.2 lb

## 2022-01-26 DIAGNOSIS — D469 Myelodysplastic syndrome, unspecified: Secondary | ICD-10-CM | POA: Diagnosis not present

## 2022-01-26 DIAGNOSIS — D61818 Other pancytopenia: Secondary | ICD-10-CM

## 2022-01-26 LAB — CBC WITH DIFFERENTIAL (CANCER CENTER ONLY)
Abs Immature Granulocytes: 0.02 10*3/uL (ref 0.00–0.07)
Basophils Absolute: 0 10*3/uL (ref 0.0–0.1)
Basophils Relative: 1 %
Eosinophils Absolute: 0.1 10*3/uL (ref 0.0–0.5)
Eosinophils Relative: 2 %
HCT: 30.7 % — ABNORMAL LOW (ref 39.0–52.0)
Hemoglobin: 10.2 g/dL — ABNORMAL LOW (ref 13.0–17.0)
Immature Granulocytes: 1 %
Lymphocytes Relative: 38 %
Lymphs Abs: 1.2 10*3/uL (ref 0.7–4.0)
MCH: 28.5 pg (ref 26.0–34.0)
MCHC: 33.2 g/dL (ref 30.0–36.0)
MCV: 85.8 fL (ref 80.0–100.0)
Monocytes Absolute: 0.6 10*3/uL (ref 0.1–1.0)
Monocytes Relative: 17 %
Neutro Abs: 1.3 10*3/uL — ABNORMAL LOW (ref 1.7–7.7)
Neutrophils Relative %: 41 %
Platelet Count: 41 10*3/uL — ABNORMAL LOW (ref 150–400)
RBC: 3.58 MIL/uL — ABNORMAL LOW (ref 4.22–5.81)
RDW: 15 % (ref 11.5–15.5)
WBC Count: 3.2 10*3/uL — ABNORMAL LOW (ref 4.0–10.5)
nRBC: 0 % (ref 0.0–0.2)

## 2022-01-26 LAB — CMP (CANCER CENTER ONLY)
ALT: <5 U/L (ref 0–44)
AST: 18 U/L (ref 15–41)
Albumin: 4.2 g/dL (ref 3.5–5.0)
Alkaline Phosphatase: 53 U/L (ref 38–126)
Anion gap: 5 (ref 5–15)
BUN: 23 mg/dL (ref 8–23)
CO2: 28 mmol/L (ref 22–32)
Calcium: 9.1 mg/dL (ref 8.9–10.3)
Chloride: 108 mmol/L (ref 98–111)
Creatinine: 1.16 mg/dL (ref 0.61–1.24)
GFR, Estimated: >60 mL/min (ref >60)
Glucose, Bld: 100 mg/dL — ABNORMAL HIGH (ref 70–99)
Potassium: 4.2 mmol/L (ref 3.5–5.1)
Sodium: 141 mmol/L (ref 135–145)
Total Bilirubin: 0.8 mg/dL (ref 0.3–1.2)
Total Protein: 7 g/dL (ref 6.5–8.1)

## 2022-01-26 MED ORDER — EPOETIN ALFA 20000 UNIT/ML IJ SOLN
20000.0000 [IU] | Freq: Once | INTRAMUSCULAR | Status: AC
  Administered 2022-01-26: 20000 [IU] via SUBCUTANEOUS
  Filled 2022-01-26: qty 1

## 2022-01-26 NOTE — Progress Notes (Signed)
[facility] [phone]   [phone]  PROGRESS NOTE  Patient Care Team: Plotnikov, Evie Lacks, MD as PCP - General Elouise Munroe, MD as PCP - Cardiology (Cardiology) Carol Ada, MD as Consulting Physician (Gastroenterology) Martinique, Amy, MD as Consulting Physician (Dermatology) Alda Berthold, DO as Consulting Physician (Neurology) [name], MD as Consulting Physician (Physical Medicine and Rehabilitation) [name], MD as Consulting Physician (Orthopedic Surgery) Szabat, Darnelle Maffucci, [facility] (Inactive) as Pharmacist (Pharmacist)  Hematological/Oncological History # Pancytopenia #Myelodysplastic Syndrome, Single Lineage Dysplasia 11/18/2021: WBC 2.4, ANC 1.1, Hgb 9.7, MCV 84.0, Plt 58. 12/07/2021: Establish care with Dr. Lorenso Courier and Dede Query 12/19/2021: Bone marrow biopsy which showed a low-grade myelodysplastic syndrome.  12/28/2021: started epoetin alpha shots 20,000 units q 2 weeks.   Interval History:  Justin [name]. 81 y.o. male with medical history significant for newly diagnosed low-grade myelodysplastic syndrome who presents for a follow up visit. The patient's last visit was on 12/22/2021.  Since that time he is started erythropoietin shots.  On exam today Justin [name] is accompanied by his daughter.  He reports he does have some bruising on his abdomen where he undergoes his erythropoietin shot.  He also notes he recently had an SCC removed from his scalp.  He reports that he is feeling not as wiped out and overall feels quite better.  He reports that he is only undergone 2 shots but feels improvement already.  He is not have any issues with, bleeding, bruising, or dark stools.  He notes that his appetite has been good and his weight has been stable.  He is otherwise been at his baseline level of health with a modest boost in energy.  He denies any fevers, chills, sweats, nausea vomiting or diarrhea.  A full 10 point ROS is  listed below.  At this time he is willing and able to proceed with erythropoietin shots.   MEDICAL HISTORY:  Past Medical History:  Diagnosis Date   Alcoholism ([place])    Dry 13 years   Basal cell carcinoma    Right Chest   BPH (benign prostatic hyperplasia)    Diverticulosis of colon    Dizzy spells    none recent   DJD (degenerative joint disease)    lower back   Elbow fracture, left 2009   [name]   GERD (gastroesophageal reflux disease)    Glaucoma    History of TIAs 1 yrs ago   Hyperkeratosis    LBP (low back pain)    Macular degeneration    left wet right dry sees [name]   Melanoma [facility]) 2009   x3 [name]   Osteoarthritis    Restless leg syndrome    Thrombocytopenia ([place]) 11/18/2021   Tinnitus    Tobacco abuse    Uses walker    Wears glasses    reading   Wears partial dentures    upper    SURGICAL HISTORY: Past Surgical History:  Procedure Laterality Date   CATARACT EXTRACTION Bilateral 2018   HARDWARE REMOVAL Left 07/09/2020   Procedure: Left elbow hardware removal;  Surgeon: [name], MD;  Location: [facility];  Service: Orthopedics;  Laterality: Left;  60 mins   mda     Dr. [name] wet injection   MELANOMA EXCISION  2009   x3   RECONSTRUCTION MEDIAL COLLATERAL LIGAMENT ELBOW W/ TENDON GRAFT  2009   Left, [name]   TONSILLECTOMY  as child  TOTAL KNEE ARTHROPLASTY Right 11/09/2020   Procedure: TOTAL KNEE ARTHROPLASTY;  Surgeon: [name], MD;  Location: [facility];  Service: Orthopedics;  Laterality: Right;    SOCIAL HISTORY: Social History   Socioeconomic History   Marital status: Married    Spouse name: Not on file   Number of children: 2   Years of education: Not on file   Highest education level: Not on file  Occupational History   Occupation: [place] Veteran - [employer]    Employer: RETIRED   Occupation: Chiropodist   Occupation: Heritage manager  Tobacco Use   Smoking status: Former     Packs/day: 3.00    Years: 38.00    Total pack years: 114.00    Types: Cigarettes    Quit date: 1986    Years since quitting: 37.5   Smokeless tobacco: Never   Tobacco comments:    states he quit May 15 1985  Vaping Use   Vaping Use: Never used  Substance and Sexual Activity   Alcohol use: No    Comment: PREVIOUS - DRY 50yrs   Drug use: Not Currently   Sexual activity: Not Currently  Other Topics Concern   Not on file  Social History Narrative   Right handed   One story home   Drinks coffee q am   Social Determinants of Health   Financial Resource Strain: Low Risk  (07/25/2021)   Overall Financial Resource Strain (CARDIA)    Difficulty of Paying Living Expenses: Not hard at all  Food Insecurity: No Food Insecurity (07/25/2021)   Hunger Vital Sign    Worried About Running Out of Food in the Last Year: Never true    [place] in the Last Year: Never true  Transportation Needs: No Transportation Needs (07/25/2021)   PRAPARE - Hydrologist (Medical): No    Lack of Transportation (Non-Medical): No  Physical Activity: Insufficiently Active (07/25/2021)   Exercise Vital Sign    Days of Exercise per Week: 3 days    Minutes of Exercise per Session: 30 min  Stress: No Stress Concern Present (07/25/2021)   [place]    Feeling of Stress : Not at all  Social Connections: Moderately Isolated (07/25/2021)   Social Connection and Isolation Panel [NHANES]    Frequency of Communication with Friends and Family: Twice a week    Frequency of Social Gatherings with Friends and Family: Twice a week    Attends Religious Services: Never    Marine scientist or Organizations: No    Attends Archivist Meetings: Never    Marital Status: Married  Human resources officer Violence: Not At Risk (07/25/2021)   Humiliation, Afraid, Rape, and Kick questionnaire    Fear of Current or Ex-Partner: No     Emotionally Abused: No    Physically Abused: No    Sexually Abused: No    FAMILY HISTORY: Family History  Problem Relation Age of Onset   Cancer Mother        ?breast   Cancer Father        Lung    ALLERGIES:  is allergic to levofloxacin.  MEDICATIONS:  Current Outpatient Medications  Medication Sig Dispense Refill   Acetaminophen (TYLENOL PO) Tylenol     calcium carbonate (TUMS EX) 750 MG chewable tablet Chew 1 tablet by mouth daily.     carbidopa-levodopa (SINEMET IR) 25-100 MG tablet For breakthrough restless leg,  you can take 1 tablet at bedtime.  OK to take extra tab as needed during the day. (Patient taking differently: Take 1 tablet by mouth at bedtime.) 100 tablet 3   Cholecalciferol 1000 UNITS tablet Take 3,000 Units by mouth daily.     Cyanocobalamin (VITAMIN B-12) 1000 MCG SUBL DISSOLVE 2 TABLETS IN MOUTH EVERY DAY 180 tablet 1   ferrous sulfate 325 (65 FE) MG tablet Take 650 mg by mouth daily with breakfast.     finasteride (PROSCAR) 5 MG tablet Take 1 tablet (5 mg total) by mouth daily. 90 tablet 1   gabapentin (NEURONTIN) 300 MG capsule Take 1 capsule (300 mg total) by mouth 4 (four) times daily. Follow-up appt due in Aug must see provider for future refills 360 capsule 0   ibuprofen (ADVIL) 200 MG tablet Take 400 mg by mouth daily as needed.     magnesium hydroxide (MILK OF MAGNESIA) 400 MG/5ML suspension Take 15 mLs by mouth daily as needed for mild constipation.     meclizine (ANTIVERT) 12.5 MG tablet TAKE 1 TABLET BY MOUTH 3 TIMES DAILY AS NEEDED FOR DIZZINESS. 60 tablet 2   Multiple Vitamins-Minerals (PRESERVISION AREDS PO) Take 2 tablets by mouth 2 (two) times daily.     Oxycodone HCl 10 MG TABS Take 10 mg by mouth 5 (five) times daily as needed (pain). Typically only uses 4 times daily     Polyethyl Glycol-Propyl Glycol (SYSTANE OP) Place 1 drop into both eyes 2 (two) times daily as needed (dry eyes). [facility] O[facility]     pravastatin (PRAVACHOL) 20 MG tablet Take 1  tablet (20 mg total) by mouth daily. 90 tablet 1   rOPINIRole (REQUIP) 2 MG tablet TAKE 1 TABLET BY MOUTH AT 12 PM 1 TABLET AT 4 PM AND 1 TABLETS AT BEDTIME 360 tablet 1   tamsulosin (FLOMAX) 0.4 MG CAPS capsule Take 1 capsule (0.4 mg total) by mouth in the morning. 90 capsule 1   terazosin (HYTRIN) 2 MG capsule TAKE 1 CAPSULE BY MOUTH EVERYDAY AT BEDTIME 90 capsule 1   No current facility-administered medications for this visit.    REVIEW OF SYSTEMS:   Constitutional: ( - ) fevers, ( - )  chills , ( - ) night sweats Eyes: ( - ) blurriness of vision, ( - ) double vision, ( - ) watery eyes Ears, nose, mouth, throat, and face: ( - ) mucositis, ( - ) sore throat Respiratory: ( - ) cough, ( - ) dyspnea, ( - ) wheezes Cardiovascular: ( - ) palpitation, ( - ) chest discomfort, ( - ) lower extremity swelling Gastrointestinal:  ( - ) nausea, ( - ) heartburn, ( - ) change in bowel habits Skin: ( - ) abnormal skin rashes Lymphatics: ( - ) new lymphadenopathy, ( - ) easy bruising Neurological: ( - ) numbness, ( - ) tingling, ( - ) new weaknesses Behavioral/Psych: ( - ) mood change, ( - ) new changes  All other systems were reviewed with the patient and are negative.  PHYSICAL EXAMINATION:  Vitals:   01/26/22 1127  BP: (!) 175/88  Pulse: (!) 103  Resp: 17  Temp: (!) 97.5 F (36.4 C)  SpO2: 100%   Filed Weights   01/26/22 1127  Weight: 173 lb 3.2 oz (78.6 kg)    GENERAL: Well-appearing elderly Caucasian male, alert, no distress and comfortable SKIN: skin color, texture, turgor are normal, no rashes or significant lesions EYES: conjunctiva are pink and non-injected, sclera clear LUNGS:  clear to auscultation and percussion with normal breathing effort HEART: regular rate & rhythm and no murmurs and no lower extremity edema Musculoskeletal: no cyanosis of digits and no clubbing  PSYCH: alert & oriented x 3, fluent speech NEURO: no focal motor/sensory deficits  LABORATORY DATA:  I have  reviewed the data as listed    Latest Ref Rng & Units 01/26/2022   11:04 AM 01/11/2022    9:20 AM 12/28/2021   10:00 AM  CBC  WBC 4.0 - 10.5 K/uL 3.2  3.9  4.8   Hemoglobin 13.0 - 17.0 g/dL 10.2  9.4  9.0   Hematocrit 39.0 - 52.0 % 30.7  29.1  27.2   Platelets 150 - 400 K/uL 41  52  53        Latest Ref Rng & Units 01/26/2022   11:04 AM 01/11/2022    9:20 AM 12/07/2021   10:44 AM  CMP  Glucose 70 - 99 mg/dL 100  89  97   BUN 8 - 23 mg/dL $Remove'23  19  21   'ELRtmEu$ Creatinine 0.61 - 1.24 mg/dL 1.16  1.09  1.01   Sodium 135 - 145 mmol/L 141  141  140   Potassium 3.5 - 5.1 mmol/L 4.2  4.4  3.9   Chloride 98 - 111 mmol/L 108  108  108   CO2 22 - 32 mmol/L $RemoveB'28  28  28   'DCOFknOk$ Calcium 8.9 - 10.3 mg/dL 9.1  9.1  9.1   Total Protein 6.5 - 8.1 g/dL 7.0  6.7  7.0   Total Bilirubin 0.3 - 1.2 mg/dL 0.8  0.6  0.6   Alkaline Phos 38 - 126 U/L 53  57  59   AST 15 - 41 U/L $Remo'18  18  17   'wtYyk$ ALT 0 - 44 U/L '5  6  6     '$ Lab Results  Component Value Date   MPROTEIN Not Observed 12/07/2021   Lab Results  Component Value Date   KPAFRELGTCHN 45.3 (H) 12/07/2021   LAMBDASER 28.9 (H) 12/07/2021   KAPLAMBRATIO 1.57 12/07/2021   RADIOGRAPHIC STUDIES: No results found.  Justin [name]. 81 y.o. male with medical history significant for newly diagnosed low-grade myelodysplastic syndrome who presents for a follow up visit.  After review the labs, review the records, discussion with the patient the findings are most consistent with myelodysplastic syndrome.  This is confirmed with bone marrow biopsy.  Given that his major issue is a drop in the hemoglobin would recommend pursuing erythropoietin therapy.  Also discussed other options including hypomethylating's as well as Luspatercept.  Given that he is not currently transfusion dependent I think we can begin with erythropoietin and consider the other options if his counts were to worsen.  Additionally research has shown that erythropoietin may actually  boost his platelets and white blood cell count in the process.  The patient voiced understanding of the plan moving forward.  R-IPSS: Low   # Myelodysplastic Syndrome, Single Lineage Dysplasia -- At this time Mr. Minella has a low-grade myelodysplasia that is anemia predominant. --Given that his hemoglobin is consistently less than 10 I would recommend we proceed with erythropoietin therapy. --In order to assure that he is a good candidate for with epoetin will order baseline erythropoietin levels and CBC today. --Discussed with the patient other options including hypomethylating therapies and Luspatercept, however given that anemia is his primary issue would recommend erythropoietin to see if this helps to elevate his  other blood counts as well. --No clear signs of nutritional deficiency during our prior evaluation. --labs today show white blood cell count 3.2, hemoglobin 10.2, platelets of 41 --will need MDS FISH study showed no 5q-. Normal molecular studies.  --RTC in 8 weeks time to assure he is tolerating the shots   No orders of the defined types were placed in this encounter.   All questions were answered. The patient knows to call the clinic with any problems, questions or concerns.  A total of more than 30 minutes were spent on this encounter with face-to-face time and non-face-to-face time, including preparing to see the patient, ordering tests and/or medications, counseling the patient and coordination of care as outlined above.   Ledell Peoples, MD Department of Hematology/Oncology [place] at [facility] Phone: [phone] Pager: [phone] Email: [email or web address]  01/28/2022 4:41 PM

## 2022-01-28 ENCOUNTER — Encounter: Payer: Self-pay | Admitting: Hematology and Oncology

## 2022-01-31 ENCOUNTER — Encounter: Payer: Self-pay | Admitting: Hematology and Oncology

## 2022-02-08 ENCOUNTER — Inpatient Hospital Stay: Payer: PPO

## 2022-02-08 ENCOUNTER — Inpatient Hospital Stay: Payer: PPO | Attending: Physician Assistant

## 2022-02-08 ENCOUNTER — Other Ambulatory Visit: Payer: Self-pay

## 2022-02-08 VITALS — BP 162/86 | HR 48 | Temp 97.9°F | Resp 18

## 2022-02-08 DIAGNOSIS — D469 Myelodysplastic syndrome, unspecified: Secondary | ICD-10-CM

## 2022-02-08 LAB — CBC WITH DIFFERENTIAL (CANCER CENTER ONLY)
Abs Immature Granulocytes: 0.02 10*3/uL (ref 0.00–0.07)
Basophils Absolute: 0 10*3/uL (ref 0.0–0.1)
Basophils Relative: 1 %
Eosinophils Absolute: 0 10*3/uL (ref 0.0–0.5)
Eosinophils Relative: 1 %
HCT: 30.5 % — ABNORMAL LOW (ref 39.0–52.0)
Hemoglobin: 10.2 g/dL — ABNORMAL LOW (ref 13.0–17.0)
Immature Granulocytes: 1 %
Lymphocytes Relative: 35 %
Lymphs Abs: 1 10*3/uL (ref 0.7–4.0)
MCH: 28.3 pg (ref 26.0–34.0)
MCHC: 33.4 g/dL (ref 30.0–36.0)
MCV: 84.7 fL (ref 80.0–100.0)
Monocytes Absolute: 0.5 10*3/uL (ref 0.1–1.0)
Monocytes Relative: 17 %
Neutro Abs: 1.3 10*3/uL — ABNORMAL LOW (ref 1.7–7.7)
Neutrophils Relative %: 45 %
Platelet Count: 54 10*3/uL — ABNORMAL LOW (ref 150–400)
RBC: 3.6 MIL/uL — ABNORMAL LOW (ref 4.22–5.81)
RDW: 15 % (ref 11.5–15.5)
WBC Count: 2.9 10*3/uL — ABNORMAL LOW (ref 4.0–10.5)
nRBC: 0 % (ref 0.0–0.2)

## 2022-02-08 LAB — CMP (CANCER CENTER ONLY)
ALT: 5 U/L (ref 0–44)
AST: 18 U/L (ref 15–41)
Albumin: 4.1 g/dL (ref 3.5–5.0)
Alkaline Phosphatase: 56 U/L (ref 38–126)
Anion gap: 4 — ABNORMAL LOW (ref 5–15)
BUN: 22 mg/dL (ref 8–23)
CO2: 27 mmol/L (ref 22–32)
Calcium: 9 mg/dL (ref 8.9–10.3)
Chloride: 108 mmol/L (ref 98–111)
Creatinine: 1.07 mg/dL (ref 0.61–1.24)
GFR, Estimated: >60 mL/min (ref >60)
Glucose, Bld: 110 mg/dL — ABNORMAL HIGH (ref 70–99)
Potassium: 4.1 mmol/L (ref 3.5–5.1)
Sodium: 139 mmol/L (ref 135–145)
Total Bilirubin: 0.7 mg/dL (ref 0.3–1.2)
Total Protein: 6.8 g/dL (ref 6.5–8.1)

## 2022-02-08 MED ORDER — EPOETIN ALFA 20000 UNIT/ML IJ SOLN
20000.0000 [IU] | Freq: Once | INTRAMUSCULAR | Status: AC
  Administered 2022-02-08: 20000 [IU] via SUBCUTANEOUS
  Filled 2022-02-08: qty 1

## 2022-02-15 ENCOUNTER — Ambulatory Visit (INDEPENDENT_AMBULATORY_CARE_PROVIDER_SITE_OTHER): Payer: PPO | Admitting: Ophthalmology

## 2022-02-15 ENCOUNTER — Encounter (INDEPENDENT_AMBULATORY_CARE_PROVIDER_SITE_OTHER): Payer: Self-pay | Admitting: Ophthalmology

## 2022-02-15 DIAGNOSIS — H353221 Exudative age-related macular degeneration, left eye, with active choroidal neovascularization: Secondary | ICD-10-CM

## 2022-02-15 DIAGNOSIS — H353111 Nonexudative age-related macular degeneration, right eye, early dry stage: Secondary | ICD-10-CM | POA: Diagnosis not present

## 2022-02-15 DIAGNOSIS — Z961 Presence of intraocular lens: Secondary | ICD-10-CM | POA: Diagnosis not present

## 2022-02-15 DIAGNOSIS — H43812 Vitreous degeneration, left eye: Secondary | ICD-10-CM | POA: Diagnosis not present

## 2022-02-15 MED ORDER — AFLIBERCEPT 2MG/0.05ML IZ SOLN FOR KALEIDOSCOPE
2.0000 mg | INTRAVITREAL | Status: AC | PRN
  Administered 2022-02-15: 2 mg via INTRAVITREAL

## 2022-02-15 NOTE — Progress Notes (Signed)
02/15/2022     CHIEF COMPLAINT Patient presents for  Chief Complaint  Patient presents with   Macular Degeneration      HISTORY OF PRESENT ILLNESS: Justin [name]. is a 81 y.o. male who presents to the clinic today for:   HPI   10 weeks for DILATE OU, COLOR FP, EYLEA OCT, OS. Pt stated vision has been stable since last visit.    Last edited by Silvestre Moment on 02/15/2022  8:21 AM.      Referring physician: [name], MD [place],  [place] [id number]  HISTORICAL INFORMATION:   Selected notes from the MEDICAL RECORD NUMBER    Lab Results  Component Value Date   HGBA1C 5.3 04/19/2021     CURRENT MEDICATIONS: Current Outpatient Medications (Ophthalmic Drugs)  Medication Sig   Polyethyl Glycol-Propyl Glycol (SYSTANE OP) Place 1 drop into both eyes 2 (two) times daily as needed (dry eyes). [facility]   No current facility-administered medications for this visit. (Ophthalmic Drugs)   Current Outpatient Medications (Other)  Medication Sig   Acetaminophen (TYLENOL PO) Tylenol   calcium carbonate (TUMS EX) 750 MG chewable tablet Chew 1 tablet by mouth daily.   carbidopa-levodopa (SINEMET IR) 25-100 MG tablet For breakthrough restless leg, you can take 1 tablet at bedtime.  OK to take extra tab as needed during the day. (Patient taking differently: Take 1 tablet by mouth at bedtime.)   Cholecalciferol 1000 UNITS tablet Take 3,000 Units by mouth daily.   Cyanocobalamin (VITAMIN B-12) 1000 MCG SUBL DISSOLVE 2 TABLETS IN MOUTH EVERY DAY   ferrous sulfate 325 (65 FE) MG tablet Take 650 mg by mouth daily with breakfast.   finasteride (PROSCAR) 5 MG tablet Take 1 tablet (5 mg total) by mouth daily.   gabapentin (NEURONTIN) 300 MG capsule Take 1 capsule (300 mg total) by mouth 4 (four) times daily. Follow-up appt due in Aug must see provider for future refills   ibuprofen (ADVIL) 200 MG tablet Take 400 mg by mouth daily as needed.   magnesium hydroxide  (MILK OF MAGNESIA) 400 MG/5ML suspension Take 15 mLs by mouth daily as needed for mild constipation.   meclizine (ANTIVERT) 12.5 MG tablet TAKE 1 TABLET BY MOUTH 3 TIMES DAILY AS NEEDED FOR DIZZINESS.   Multiple Vitamins-Minerals (PRESERVISION AREDS PO) Take 2 tablets by mouth 2 (two) times daily.   Oxycodone HCl 10 MG TABS Take 10 mg by mouth 5 (five) times daily as needed (pain). Typically only uses 4 times daily   pravastatin (PRAVACHOL) 20 MG tablet Take 1 tablet (20 mg total) by mouth daily.   rOPINIRole (REQUIP) 2 MG tablet TAKE 1 TABLET BY MOUTH AT 12 PM 1 TABLET AT 4 PM AND 1 TABLETS AT BEDTIME   tamsulosin (FLOMAX) 0.4 MG CAPS capsule Take 1 capsule (0.4 mg total) by mouth in the morning.   terazosin (HYTRIN) 2 MG capsule TAKE 1 CAPSULE BY MOUTH EVERYDAY AT BEDTIME   No current facility-administered medications for this visit. (Other)      REVIEW OF SYSTEMS: ROS   Negative for: Constitutional, Gastrointestinal, Neurological, Skin, Genitourinary, Musculoskeletal, HENT, Endocrine, Cardiovascular, Eyes, Respiratory, Psychiatric, Allergic/Imm, Heme/Lymph Last edited by Silvestre Moment on 02/15/2022  8:21 AM.       ALLERGIES Allergies  Allergen Reactions   Levofloxacin     REACTION: hands pealed    PAST MEDICAL HISTORY Past Medical History:  Diagnosis Date   Alcoholism ([place])    Dry 13  years   Basal cell carcinoma    Right Chest   BPH (benign prostatic hyperplasia)    Diverticulosis of colon    Dizzy spells    none recent   DJD (degenerative joint disease)    lower back   Elbow fracture, left 2009   [name]   GERD (gastroesophageal reflux disease)    Glaucoma    History of TIAs 1 yrs ago   Hyperkeratosis    LBP (low back pain)    Macular degeneration    left wet right dry sees [name]   Melanoma [facility]) 2009   x3 [name]   Osteoarthritis    Restless leg syndrome    Thrombocytopenia ([place]) 11/18/2021   Tinnitus    Tobacco abuse    Uses walker    Wears glasses     reading   Wears partial dentures    upper   Past Surgical History:  Procedure Laterality Date   CATARACT EXTRACTION Bilateral 2018   HARDWARE REMOVAL Left 07/09/2020   Procedure: Left elbow hardware removal;  Surgeon: [name], MD;  Location: [facility];  Service: Orthopedics;  Laterality: Left;  60 mins   mda     Dr. [name] wet injection   MELANOMA EXCISION  2009   x3   RECONSTRUCTION MEDIAL COLLATERAL LIGAMENT ELBOW W/ TENDON GRAFT  2009   Left, [name]   TONSILLECTOMY  as child   TOTAL KNEE ARTHROPLASTY Right 11/09/2020   Procedure: TOTAL KNEE ARTHROPLASTY;  Surgeon: [name], MD;  Location: [facility];  Service: Orthopedics;  Laterality: Right;    FAMILY HISTORY Family History  Problem Relation Age of Onset   Cancer Mother        ?breast   Cancer Father        Lung    SOCIAL HISTORY Social History   Tobacco Use   Smoking status: Former    Packs/day: 3.00    Years: 38.00    Total pack years: 114.00    Types: Cigarettes    Quit date: 1986    Years since quitting: 37.6   Smokeless tobacco: Never   Tobacco comments:    states he quit May 15 1985  Vaping Use   Vaping Use: Never used  Substance Use Topics   Alcohol use: No    Comment: PREVIOUS - DRY 47yr   Drug use: Not Currently         OPHTHALMIC EXAM:  Base Eye Exam     Visual Acuity (ETDRS)       Right Left   Dist [place] 20/20 -2 20/80 -1   Dist ph [place]  20/70 -1 +1         Tonometry (Tonopen, 8:27 AM)       Right Left   Pressure 14 14         Pupils       Pupils Dark Light Shape React APD   Right PERRL 3 3 Round Minimal None   Left PERRL 3 3 Round Minimal None         Visual Fields       Left Right    Full Full         Extraocular Movement       Right Left    Full, Ortho Full, Ortho         Neuro/Psych     Oriented x3: Yes   Mood/Affect: Normal  Dilation     Both eyes: 1.0% Mydriacyl, 2.5% Phenylephrine @ 8:27 AM            Slit Lamp and Fundus Exam     External Exam       Right Left   External Normal Normal         Slit Lamp Exam       Right Left   Lids/Lashes Normal Normal   Conjunctiva/Sclera White and quiet White and quiet   Cornea Clear Clear   Anterior Chamber Deep and quiet Deep and quiet   Iris Round and reactive Round and reactive   Lens Posterior chamber intraocular lens Posterior chamber intraocular lens   Anterior Vitreous Normal Normal         Fundus Exam       Right Left   Posterior Vitreous Posterior vitreous detachment Posterior vitreous detachment   Disc Normal Normal   C/D Ratio 0.5 0.65   Macula Hard drusen, no exudates, no membrane, Retinal pigment epithelial mottling Retinal pigment epithelial detachment, Retinal pigment epithelial mottling, Drusen, Hard drusen, Atrophy in the p.m. bundle region, intermediate age related macular degeneration, Geographic atrophy   Vessels Normal Normal   Periphery Normal Normal            IMAGING AND PROCEDURES  Imaging and Procedures for 02/15/22  OCT, Retina - OU - Both Eyes       Right Eye Quality was good. Scan locations included subfoveal. Central Foveal Thickness: 290. Progression has been stable. Findings include normal foveal contour, no IRF, no SRF, retinal drusen .   Left Eye Quality was good. Scan locations included subfoveal. Central Foveal Thickness: 191. Progression has been stable. Findings include no IRF, abnormal foveal contour, pigment epithelial detachment.   Notes OS, much improved now and stable at 10-week follow-up.,  Much less subretinal fluid on this visit today.  Vision limited by subretinal hyper reflective material and vascularized fibrotic PED, will repeat injection today of Eylea maintain 10-week follow-up interval       Color Fundus Photography Optos - OU - Both Eyes       Right Eye Progression has been stable. Macula : drusen, retinal pigment epithelium abnormalities. Vessels :  normal observations. Periphery : normal observations.   Left Eye Progression has been stable. Macula : drusen, retinal pigment epithelium abnormalities. Periphery : normal observations.   Notes OS with central vitreous debris and floaters.     Intravitreal Injection, Pharmacologic Agent - OS - Left Eye       Time Out 02/15/2022. 8:59 AM. Confirmed correct patient, procedure, site, and patient consented.   Anesthesia Topical anesthesia was used. Anesthetic medications included Lidocaine 4%.   Procedure Preparation included 5% betadine to ocular surface, 10% betadine to eyelids, Tobramycin 0.3%. A 30 gauge needle was used.   Injection: 2 mg aflibercept 2 MG/0.05ML   Route: Intravitreal, Site: Left Eye   NDC: [id number], Lot: [id number], Expiration date: 03/05/2023, Waste: 0 mL   Post-op Post injection exam found visual acuity of at least counting fingers. The patient tolerated the procedure well. There were no complications. The patient received written and verbal post procedure care education. Post injection medications included gentamicin.              ASSESSMENT/PLAN:  Early stage nonexudative age-related macular degeneration of right eye No sign of CNVM OD  Exudative age-related macular degeneration of left eye with active choroidal neovascularization (HCC) OS at 10 weeks doing well with no recurrence of  CNVM from vascularized PED.  Stable acuity overall.  Some effect of the central vitreous floaters on the vision left eye.  Posterior vitreous detachment of left eye Accompanied by central vitreous floaters OS  Pseudophakia of both eyes OU stable     ICD-10-CM   1. Exudative age-related macular degeneration of left eye with active choroidal neovascularization (HCC)  H35.3221 OCT, Retina - OU - Both Eyes    Color Fundus Photography Optos - OU - Both Eyes    Intravitreal Injection, Pharmacologic Agent - OS - Left Eye    aflibercept (EYLEA) SOLN 2 mg    2. Early  stage nonexudative age-related macular degeneration of right eye  H35.3111     3. Posterior vitreous detachment of left eye  H43.812     4. Pseudophakia of both eyes  Z96.1       1.  OS with vascularized PED subfoveal.  Stable over time remains at 10-week interval on Eylea.  Repeat injection today reevaluate next in 10 weeks  2.  OD intermediate AMD no change over time  3.  OS with central vitreous floaters, no impact on acuity per patient  4.  Recent diagnosis of myelodysplasia with anemia.  Rectal impact on I condition other than severe anemia untreated can create oxygenation issues that could theoretically worsen the macular condition without being causative  Ophthalmic Meds Ordered this visit:  Meds ordered this encounter  Medications   aflibercept (EYLEA) SOLN 2 mg       Return in about 10 weeks (around 04/26/2022) for DILATE OU, EYLEA OCT, OS.  There are no Patient Instructions on file for this visit.   Explained the diagnoses, plan, and follow up with the patient and they expressed understanding.  Patient expressed understanding of the importance of proper follow up care.   Clent Demark Banjamin Stovall M.D. Diseases & Surgery of the Retina and Vitreous [facility] [place] 02/15/22     Abbreviations: M myopia (nearsighted); A astigmatism; H hyperopia (farsighted); P presbyopia; Mrx spectacle prescription;  CTL contact lenses; OD right eye; OS left eye; OU both eyes  XT exotropia; ET esotropia; PEK punctate epithelial keratitis; PEE punctate epithelial erosions; DES dry eye syndrome; MGD meibomian gland dysfunction; ATs artificial tears; PFAT's preservative free artificial tears; [place] nuclear sclerotic cataract; PSC posterior subcapsular cataract; ERM epi-retinal membrane; PVD posterior vitreous detachment; RD retinal detachment; DM diabetes mellitus; DR diabetic retinopathy; NPDR non-proliferative diabetic retinopathy; PDR proliferative diabetic retinopathy; CSME clinically  significant macular edema; DME diabetic macular edema; dbh dot blot hemorrhages; CWS cotton wool spot; POAG primary open angle glaucoma; C/D cup-to-disc ratio; HVF humphrey visual field; GVF goldmann visual field; OCT optical coherence tomography; IOP intraocular pressure; BRVO Branch retinal vein occlusion; CRVO central retinal vein occlusion; CRAO central retinal artery occlusion; BRAO branch retinal artery occlusion; RT retinal tear; SB scleral buckle; PPV pars plana vitrectomy; VH Vitreous hemorrhage; PRP panretinal laser photocoagulation; IVK intravitreal kenalog; VMT vitreomacular traction; MH Macular hole;  NVD neovascularization of the disc; NVE neovascularization elsewhere; AREDS age related eye disease study; ARMD age related macular degeneration; POAG primary open angle glaucoma; EBMD epithelial/anterior basement membrane dystrophy; ACIOL anterior chamber intraocular lens; IOL intraocular lens; PCIOL posterior chamber intraocular lens; Phaco/IOL phacoemulsification with intraocular lens placement; [place] photorefractive keratectomy; LASIK laser assisted in situ keratomileusis; HTN hypertension; DM diabetes mellitus; COPD chronic obstructive pulmonary disease

## 2022-02-15 NOTE — Assessment & Plan Note (Signed)
Accompanied by central vitreous floaters OS

## 2022-02-15 NOTE — Assessment & Plan Note (Signed)
No sign of CNVM OD 

## 2022-02-15 NOTE — Assessment & Plan Note (Signed)
OS at 10 weeks doing well with no recurrence of CNVM from vascularized PED.  Stable acuity overall.  Some effect of the central vitreous floaters on the vision left eye.

## 2022-02-15 NOTE — Assessment & Plan Note (Signed)
OU stable

## 2022-02-20 ENCOUNTER — Encounter (INDEPENDENT_AMBULATORY_CARE_PROVIDER_SITE_OTHER): Payer: PPO | Admitting: Ophthalmology

## 2022-02-21 ENCOUNTER — Ambulatory Visit (INDEPENDENT_AMBULATORY_CARE_PROVIDER_SITE_OTHER): Payer: PPO | Admitting: Internal Medicine

## 2022-02-21 ENCOUNTER — Encounter: Payer: Self-pay | Admitting: Internal Medicine

## 2022-02-21 DIAGNOSIS — L570 Actinic keratosis: Secondary | ICD-10-CM | POA: Diagnosis not present

## 2022-02-21 DIAGNOSIS — D1801 Hemangioma of skin and subcutaneous tissue: Secondary | ICD-10-CM | POA: Diagnosis not present

## 2022-02-21 DIAGNOSIS — M545 Low back pain, unspecified: Secondary | ICD-10-CM | POA: Diagnosis not present

## 2022-02-21 DIAGNOSIS — D469 Myelodysplastic syndrome, unspecified: Secondary | ICD-10-CM

## 2022-02-21 DIAGNOSIS — E559 Vitamin D deficiency, unspecified: Secondary | ICD-10-CM | POA: Diagnosis not present

## 2022-02-21 DIAGNOSIS — G8929 Other chronic pain: Secondary | ICD-10-CM | POA: Diagnosis not present

## 2022-02-21 DIAGNOSIS — D692 Other nonthrombocytopenic purpura: Secondary | ICD-10-CM | POA: Diagnosis not present

## 2022-02-21 DIAGNOSIS — D2271 Melanocytic nevi of right lower limb, including hip: Secondary | ICD-10-CM | POA: Diagnosis not present

## 2022-02-21 DIAGNOSIS — Z85828 Personal history of other malignant neoplasm of skin: Secondary | ICD-10-CM | POA: Diagnosis not present

## 2022-02-21 DIAGNOSIS — I10 Essential (primary) hypertension: Secondary | ICD-10-CM

## 2022-02-21 DIAGNOSIS — D2371 Other benign neoplasm of skin of right lower limb, including hip: Secondary | ICD-10-CM | POA: Diagnosis not present

## 2022-02-21 DIAGNOSIS — D225 Melanocytic nevi of trunk: Secondary | ICD-10-CM | POA: Diagnosis not present

## 2022-02-21 MED ORDER — AMLODIPINE BESYLATE 5 MG PO TABS: 5.0000 mg | ORAL_TABLET | Freq: Every day | ORAL | 3 refills | Status: DC

## 2022-02-21 NOTE — Assessment & Plan Note (Signed)
On Oxycodone per Dr Nelva Bush  Potential benefits of a long term opioids use as well as potential risks (i.e. addiction risk, apnea etc) and complications (i.e. Somnolence, constipation and others) were explained to the patient and were aknowledged.  Blue-Emu cream was recommended to use 2-3 times a day

## 2022-02-21 NOTE — Progress Notes (Signed)
Subjective:  Patient ID: Justin [name]., male    DOB: 12-14-1940  Age: 81 y.o. MRN: [id number]  CC: Follow-up (3 month f/u)   HPI [employer]. presents for anemia, MDS, HTN - new  Outpatient Medications Prior to Visit  Medication Sig Dispense Refill   Acetaminophen (TYLENOL PO) Tylenol     calcium carbonate (TUMS EX) 750 MG chewable tablet Chew 1 tablet by mouth daily.     carbidopa-levodopa (SINEMET IR) 25-100 MG tablet For breakthrough restless leg, you can take 1 tablet at bedtime.  OK to take extra tab as needed during the day. (Patient taking differently: Take 1 tablet by mouth at bedtime.) 100 tablet 3   Cholecalciferol 1000 UNITS tablet Take 3,000 Units by mouth daily.     Cyanocobalamin (VITAMIN B-12) 1000 MCG SUBL DISSOLVE 2 TABLETS IN MOUTH EVERY DAY 180 tablet 1   ferrous sulfate 325 (65 FE) MG tablet Take 650 mg by mouth daily with breakfast.     finasteride (PROSCAR) 5 MG tablet Take 1 tablet (5 mg total) by mouth daily. 90 tablet 1   gabapentin (NEURONTIN) 300 MG capsule Take 1 capsule (300 mg total) by mouth 4 (four) times daily. Follow-up appt due in Aug must see provider for future refills 360 capsule 0   magnesium hydroxide (MILK OF MAGNESIA) 400 MG/5ML suspension Take 15 mLs by mouth daily as needed for mild constipation.     meclizine (ANTIVERT) 12.5 MG tablet TAKE 1 TABLET BY MOUTH 3 TIMES DAILY AS NEEDED FOR DIZZINESS. 60 tablet 2   Multiple Vitamins-Minerals (PRESERVISION AREDS PO) Take 2 tablets by mouth 2 (two) times daily.     Oxycodone HCl 10 MG TABS Take 10 mg by mouth 5 (five) times daily as needed (pain). Typically only uses 4 times daily     Polyethyl Glycol-Propyl Glycol (SYSTANE OP) Place 1 drop into both eyes 2 (two) times daily as needed (dry eyes). [facility]     pravastatin (PRAVACHOL) 20 MG tablet Take 1 tablet (20 mg total) by mouth daily. 90 tablet 1   rOPINIRole (REQUIP) 2 MG tablet TAKE 1 TABLET BY MOUTH AT 12 PM 1 TABLET AT 4 PM AND  1 TABLETS AT BEDTIME 360 tablet 1   tamsulosin (FLOMAX) 0.4 MG CAPS capsule Take 1 capsule (0.4 mg total) by mouth in the morning. 90 capsule 1   terazosin (HYTRIN) 2 MG capsule TAKE 1 CAPSULE BY MOUTH EVERYDAY AT BEDTIME 90 capsule 1   ibuprofen (ADVIL) 200 MG tablet Take 400 mg by mouth daily as needed.     No facility-administered medications prior to visit.    ROS: Review of Systems  Constitutional:  Positive for fatigue. Negative for appetite change and unexpected weight change.  HENT:  Negative for congestion, nosebleeds, sneezing, sore throat and trouble swallowing.   Eyes:  Negative for itching and visual disturbance.  Respiratory:  Negative for cough.   Cardiovascular:  Negative for chest pain, palpitations and leg swelling.  Gastrointestinal:  Negative for abdominal distention, blood in stool, diarrhea and nausea.  Genitourinary:  Negative for frequency and hematuria.  Musculoskeletal:  Negative for back pain, gait problem, joint swelling and neck pain.  Skin:  Negative for rash.  Neurological:  Negative for dizziness, tremors, speech difficulty and weakness.  Psychiatric/Behavioral:  Negative for agitation, dysphoric mood, sleep disturbance and suicidal ideas. The patient is not nervous/anxious.     Objective:  BP (!) 162/70 (BP Location: Left Arm)   Pulse [name])  47   Temp 97.6 F (36.4 C) (Oral)   Ht 5' 8.5" (1.74 m)   Wt 175 lb 9.6 oz (79.7 kg)   SpO2 96%   BMI 26.31 kg/m   BP Readings from Last 3 Encounters:  02/21/22 (!) 162/70  02/08/22 (!) 162/86  01/26/22 (!) 175/88    Wt Readings from Last 3 Encounters:  02/21/22 175 lb 9.6 oz (79.7 kg)  01/26/22 173 lb 3.2 oz (78.6 kg)  12/22/21 176 lb 6.4 oz (80 kg)    Physical Exam Constitutional:      General: He is not in acute distress.    Appearance: He is well-developed. He is obese.     Comments: NAD  Eyes:     Conjunctiva/sclera: Conjunctivae normal.     Pupils: Pupils are equal, round, and reactive to  light.  Neck:     Thyroid: No thyromegaly.     Vascular: No JVD.  Cardiovascular:     Rate and Rhythm: Normal rate and regular rhythm.     Heart sounds: Normal heart sounds. No murmur heard.    No friction rub. No gallop.  Pulmonary:     Effort: Pulmonary effort is normal. No respiratory distress.     Breath sounds: Normal breath sounds. No wheezing or rales.  Chest:     Chest wall: No tenderness.  Abdominal:     General: Bowel sounds are normal. There is no distension.     Palpations: Abdomen is soft. There is no mass.     Tenderness: There is no abdominal tenderness. There is no guarding or rebound.  Musculoskeletal:        General: Tenderness present. Normal range of motion.     Cervical back: Normal range of motion.  Lymphadenopathy:     Cervical: No cervical adenopathy.  Skin:    General: Skin is warm and dry.     Findings: No rash.  Neurological:     Mental Status: He is alert and oriented to person, place, and time.     Cranial Nerves: No cranial nerve deficit.     Motor: No abnormal muscle tone.     Coordination: Coordination normal.     Gait: Gait abnormal.     Deep Tendon Reflexes: Reflexes are normal and symmetric.  Psychiatric:        Behavior: Behavior normal.        Thought Content: Thought content normal.        Judgment: Judgment normal.   Using a cane  Lab Results  Component Value Date   WBC 2.9 (L) 02/08/2022   HGB 10.2 (L) 02/08/2022   HCT 30.5 (L) 02/08/2022   PLT 54 (L) 02/08/2022   GLUCOSE 110 (H) 02/08/2022   CHOL 136 01/12/2017   TRIG 67.0 01/12/2017   HDL 54.70 01/12/2017   LDLCALC 68 01/12/2017   ALT 5 02/08/2022   AST 18 02/08/2022   NA 139 02/08/2022   K 4.1 02/08/2022   CL 108 02/08/2022   CREATININE 1.07 02/08/2022   BUN 22 02/08/2022   CO2 27 02/08/2022   TSH 3.05 10/16/2019   PSA 0.47 08/30/2016   INR 1.1 10/28/2020   HGBA1C 5.3 04/19/2021    No results found.  Assessment & Plan:   Problem List Items Addressed This  Visit     HTN (hypertension)    Not controlled On Hytrin Add Amlodipine      Relevant Medications   amLODipine (NORVASC) 5 MG tablet   Low back pain  On Oxycodone per Dr Nelva Bush  Potential benefits of a long term opioids use as well as potential risks (i.e. addiction risk, apnea etc) and complications (i.e. Somnolence, constipation and others) were explained to the patient and were aknowledged.  Blue-Emu cream was recommended to use 2-3 times a day      MDS (myelodysplastic syndrome) (HCC)    S/p bone marrow bx F/u w/Dr Lorenso Courier      Vitamin D deficiency    On Vit D         Meds ordered this encounter  Medications   amLODipine (NORVASC) 5 MG tablet    Sig: Take 1 tablet (5 mg total) by mouth daily.    Dispense:  90 tablet    Refill:  3      Follow-up: Return in about 3 months (around 05/24/2022) for a follow-up visit.  [name], MD

## 2022-02-21 NOTE — Assessment & Plan Note (Signed)
S/p bone marrow bx F/u w/Dr Lorenso Courier

## 2022-02-21 NOTE — Assessment & Plan Note (Signed)
On Vit D 

## 2022-02-21 NOTE — Assessment & Plan Note (Signed)
Not controlled On Hytrin Add Amlodipine

## 2022-02-22 ENCOUNTER — Inpatient Hospital Stay (HOSPITAL_BASED_OUTPATIENT_CLINIC_OR_DEPARTMENT_OTHER): Payer: PPO

## 2022-02-22 ENCOUNTER — Inpatient Hospital Stay: Payer: PPO

## 2022-02-22 ENCOUNTER — Other Ambulatory Visit: Payer: Self-pay

## 2022-02-22 VITALS — BP 160/76 | HR 62 | Temp 97.8°F | Resp 16

## 2022-02-22 DIAGNOSIS — D469 Myelodysplastic syndrome, unspecified: Secondary | ICD-10-CM | POA: Diagnosis not present

## 2022-02-22 LAB — CMP (CANCER CENTER ONLY)
ALT: 7 U/L (ref 0–44)
AST: 19 U/L (ref 15–41)
Albumin: 4.2 g/dL (ref 3.5–5.0)
Alkaline Phosphatase: 56 U/L (ref 38–126)
Anion gap: 4 — ABNORMAL LOW (ref 5–15)
BUN: 21 mg/dL (ref 8–23)
CO2: 28 mmol/L (ref 22–32)
Calcium: 9 mg/dL (ref 8.9–10.3)
Chloride: 107 mmol/L (ref 98–111)
Creatinine: 0.96 mg/dL (ref 0.61–1.24)
GFR, Estimated: >60 mL/min (ref >60)
Glucose, Bld: 135 mg/dL — ABNORMAL HIGH (ref 70–99)
Potassium: 3.8 mmol/L (ref 3.5–5.1)
Sodium: 139 mmol/L (ref 135–145)
Total Bilirubin: 0.9 mg/dL (ref 0.3–1.2)
Total Protein: 6.7 g/dL (ref 6.5–8.1)

## 2022-02-22 LAB — CBC WITH DIFFERENTIAL (CANCER CENTER ONLY)
Abs Immature Granulocytes: 0.02 10*3/uL (ref 0.00–0.07)
Basophils Absolute: 0 10*3/uL (ref 0.0–0.1)
Basophils Relative: 0 %
Eosinophils Absolute: 0 10*3/uL (ref 0.0–0.5)
Eosinophils Relative: 1 %
HCT: 32.2 % — ABNORMAL LOW (ref 39.0–52.0)
Hemoglobin: 10.6 g/dL — ABNORMAL LOW (ref 13.0–17.0)
Immature Granulocytes: 1 %
Lymphocytes Relative: 46 %
Lymphs Abs: 1.1 10*3/uL (ref 0.7–4.0)
MCH: 27.5 pg (ref 26.0–34.0)
MCHC: 32.9 g/dL (ref 30.0–36.0)
MCV: 83.6 fL (ref 80.0–100.0)
Monocytes Absolute: 0.4 10*3/uL (ref 0.1–1.0)
Monocytes Relative: 15 %
Neutro Abs: 0.9 10*3/uL — ABNORMAL LOW (ref 1.7–7.7)
Neutrophils Relative %: 37 %
Platelet Count: 37 10*3/uL — ABNORMAL LOW (ref 150–400)
RBC: 3.85 MIL/uL — ABNORMAL LOW (ref 4.22–5.81)
RDW: 14.6 % (ref 11.5–15.5)
WBC Count: 2.4 10*3/uL — ABNORMAL LOW (ref 4.0–10.5)
nRBC: 0 % (ref 0.0–0.2)

## 2022-02-22 MED ORDER — EPOETIN ALFA-EPBX 20000 UNIT/ML IJ SOLN
20000.0000 [IU] | Freq: Once | INTRAMUSCULAR | Status: AC
  Administered 2022-02-22: 20000 [IU] via SUBCUTANEOUS
  Filled 2022-02-22: qty 1

## 2022-02-22 MED ORDER — EPOETIN ALFA 20000 UNIT/ML IJ SOLN
20000.0000 [IU] | Freq: Once | INTRAMUSCULAR | Status: DC
  Filled 2022-02-22: qty 1

## 2022-02-22 NOTE — Progress Notes (Signed)
Ok to give epogen w/ elevated BP per MD Lorenso Courier.  Larene Beach, PharmD

## 2022-02-22 NOTE — Patient Instructions (Signed)

## 2022-03-07 ENCOUNTER — Ambulatory Visit: Payer: PPO | Admitting: Podiatry

## 2022-03-07 DIAGNOSIS — G629 Polyneuropathy, unspecified: Secondary | ICD-10-CM

## 2022-03-07 DIAGNOSIS — M79674 Pain in right toe(s): Secondary | ICD-10-CM | POA: Diagnosis not present

## 2022-03-07 DIAGNOSIS — B351 Tinea unguium: Secondary | ICD-10-CM | POA: Diagnosis not present

## 2022-03-07 DIAGNOSIS — M79675 Pain in left toe(s): Secondary | ICD-10-CM | POA: Diagnosis not present

## 2022-03-08 ENCOUNTER — Inpatient Hospital Stay: Payer: PPO | Attending: Physician Assistant

## 2022-03-08 ENCOUNTER — Inpatient Hospital Stay: Payer: PPO

## 2022-03-08 ENCOUNTER — Other Ambulatory Visit: Payer: Self-pay

## 2022-03-08 VITALS — BP 146/63 | HR 48 | Temp 98.2°F | Resp 18

## 2022-03-08 DIAGNOSIS — D469 Myelodysplastic syndrome, unspecified: Secondary | ICD-10-CM | POA: Insufficient documentation

## 2022-03-08 LAB — CMP (CANCER CENTER ONLY)
ALT: <5 U/L (ref 0–44)
AST: 19 U/L (ref 15–41)
Albumin: 4.3 g/dL (ref 3.5–5.0)
Alkaline Phosphatase: 60 U/L (ref 38–126)
Anion gap: 6 (ref 5–15)
BUN: 28 mg/dL — ABNORMAL HIGH (ref 8–23)
CO2: 26 mmol/L (ref 22–32)
Calcium: 9.1 mg/dL (ref 8.9–10.3)
Chloride: 106 mmol/L (ref 98–111)
Creatinine: 1.18 mg/dL (ref 0.61–1.24)
GFR, Estimated: >60 mL/min (ref >60)
Glucose, Bld: 101 mg/dL — ABNORMAL HIGH (ref 70–99)
Potassium: 4.1 mmol/L (ref 3.5–5.1)
Sodium: 138 mmol/L (ref 135–145)
Total Bilirubin: 0.8 mg/dL (ref 0.3–1.2)
Total Protein: 7.1 g/dL (ref 6.5–8.1)

## 2022-03-08 LAB — CBC WITH DIFFERENTIAL (CANCER CENTER ONLY)
Abs Immature Granulocytes: 0.01 10*3/uL (ref 0.00–0.07)
Basophils Absolute: 0 10*3/uL (ref 0.0–0.1)
Basophils Relative: 1 %
Eosinophils Absolute: 0 10*3/uL (ref 0.0–0.5)
Eosinophils Relative: 1 %
HCT: 32.3 % — ABNORMAL LOW (ref 39.0–52.0)
Hemoglobin: 10.6 g/dL — ABNORMAL LOW (ref 13.0–17.0)
Immature Granulocytes: 0 %
Lymphocytes Relative: 38 %
Lymphs Abs: 1.3 10*3/uL (ref 0.7–4.0)
MCH: 26.8 pg (ref 26.0–34.0)
MCHC: 32.8 g/dL (ref 30.0–36.0)
MCV: 81.6 fL (ref 80.0–100.0)
Monocytes Absolute: 0.5 10*3/uL (ref 0.1–1.0)
Monocytes Relative: 14 %
Neutro Abs: 1.6 10*3/uL — ABNORMAL LOW (ref 1.7–7.7)
Neutrophils Relative %: 46 %
Platelet Count: 50 10*3/uL — ABNORMAL LOW (ref 150–400)
RBC: 3.96 MIL/uL — ABNORMAL LOW (ref 4.22–5.81)
RDW: 14.5 % (ref 11.5–15.5)
WBC Count: 3.5 10*3/uL — ABNORMAL LOW (ref 4.0–10.5)
nRBC: 0 % (ref 0.0–0.2)

## 2022-03-08 MED ORDER — EPOETIN ALFA-EPBX 20000 UNIT/ML IJ SOLN
20000.0000 [IU] | Freq: Once | INTRAMUSCULAR | Status: AC
  Administered 2022-03-08: 20000 [IU] via SUBCUTANEOUS
  Filled 2022-03-08: qty 1

## 2022-03-10 ENCOUNTER — Telehealth: Payer: PPO

## 2022-03-11 NOTE — Progress Notes (Signed)
Subjective: 81 y.o. returns the office today for painful, elongated, thickened toenails which he cannot trim himself and also a painful corn second toe on the left foot.  Denies any open lesions no reports.  He continues with offloading for the hammertoe.  No fevers or chills.  No other concerns.   PCP: [name], MD Last seen 02/21/2022  Objective: AAO 3, NAD DP/PT pulses palpable, CRT less than 3 seconds Neurological status is unchanged.  Nails hypertrophic, dystrophic, elongated, brittle, discolored 10. There is tenderness overlying the nails 1-5 bilaterally. There is no surrounding erythema or drainage along the nail sites. Thick hyperkeratotic tissue present distal aspect left second toe without any underlying ulceration drainage or any signs of infection.   Hammertoes present. No pain with calf compression, swelling, warmth, erythema.  Assessment: Patient presents with symptomatic onychomycosis; hyperkeratotic lesion  Plan: -Treatment options including alternatives, risks, complications were discussed -Nails sharply debrided 10 without complication/bleeding. -Hyperkeratotic lesion sharply debrided x 1 without any complications.  Continue with offloading -Neuropathy is stable, will continue to monitor  -Monitor for any clinical signs or symptoms of infection and directed to call the office immediately should any occur or go to the ER.  Trula Slade DPM

## 2022-03-22 ENCOUNTER — Other Ambulatory Visit: Payer: Self-pay

## 2022-03-22 ENCOUNTER — Inpatient Hospital Stay: Payer: PPO

## 2022-03-22 ENCOUNTER — Inpatient Hospital Stay: Payer: PPO | Admitting: Hematology and Oncology

## 2022-03-22 VITALS — BP 154/76 | HR 63 | Temp 97.9°F | Wt 173.2 lb

## 2022-03-22 DIAGNOSIS — D469 Myelodysplastic syndrome, unspecified: Secondary | ICD-10-CM

## 2022-03-22 DIAGNOSIS — D61818 Other pancytopenia: Secondary | ICD-10-CM | POA: Diagnosis not present

## 2022-03-22 LAB — CMP (CANCER CENTER ONLY)
ALT: <5 U/L (ref 0–44)
AST: 17 U/L (ref 15–41)
Albumin: 4.2 g/dL (ref 3.5–5.0)
Alkaline Phosphatase: 59 U/L (ref 38–126)
Anion gap: 4 — ABNORMAL LOW (ref 5–15)
BUN: 23 mg/dL (ref 8–23)
CO2: 28 mmol/L (ref 22–32)
Calcium: 8.9 mg/dL (ref 8.9–10.3)
Chloride: 107 mmol/L (ref 98–111)
Creatinine: 1.16 mg/dL (ref 0.61–1.24)
GFR, Estimated: >60 mL/min (ref >60)
Glucose, Bld: 102 mg/dL — ABNORMAL HIGH (ref 70–99)
Potassium: 4.2 mmol/L (ref 3.5–5.1)
Sodium: 139 mmol/L (ref 135–145)
Total Bilirubin: 0.8 mg/dL (ref 0.3–1.2)
Total Protein: 6.9 g/dL (ref 6.5–8.1)

## 2022-03-22 LAB — CBC WITH DIFFERENTIAL (CANCER CENTER ONLY)
Abs Immature Granulocytes: 0.02 10*3/uL (ref 0.00–0.07)
Basophils Absolute: 0 10*3/uL (ref 0.0–0.1)
Basophils Relative: 1 %
Eosinophils Absolute: 0 10*3/uL (ref 0.0–0.5)
Eosinophils Relative: 1 %
HCT: 33.1 % — ABNORMAL LOW (ref 39.0–52.0)
Hemoglobin: 10.7 g/dL — ABNORMAL LOW (ref 13.0–17.0)
Immature Granulocytes: 1 %
Lymphocytes Relative: 40 %
Lymphs Abs: 1 10*3/uL (ref 0.7–4.0)
MCH: 26.5 pg (ref 26.0–34.0)
MCHC: 32.3 g/dL (ref 30.0–36.0)
MCV: 81.9 fL (ref 80.0–100.0)
Monocytes Absolute: 0.4 10*3/uL (ref 0.1–1.0)
Monocytes Relative: 17 %
Neutro Abs: 1.1 10*3/uL — ABNORMAL LOW (ref 1.7–7.7)
Neutrophils Relative %: 40 %
Platelet Count: 49 10*3/uL — ABNORMAL LOW (ref 150–400)
RBC: 4.04 MIL/uL — ABNORMAL LOW (ref 4.22–5.81)
RDW: 14.7 % (ref 11.5–15.5)
Smear Review: NORMAL
WBC Count: 2.6 10*3/uL — ABNORMAL LOW (ref 4.0–10.5)
nRBC: 0 % (ref 0.0–0.2)

## 2022-03-22 MED ORDER — EPOETIN ALFA-EPBX 20000 UNIT/ML IJ SOLN
20000.0000 [IU] | Freq: Once | INTRAMUSCULAR | Status: AC
  Administered 2022-03-22: 20000 [IU] via SUBCUTANEOUS
  Filled 2022-03-22: qty 1

## 2022-03-22 NOTE — Progress Notes (Signed)
[place] [phone]   [phone]  PROGRESS NOTE  Patient Care Team: Plotnikov, Evie Lacks, MD as PCP - General Elouise Munroe, MD as PCP - Cardiology (Cardiology) Carol Ada, MD as Consulting Physician (Gastroenterology) Martinique, Amy, MD as Consulting Physician (Dermatology) Alda Berthold, DO as Consulting Physician (Neurology) [name], MD as Consulting Physician (Physical Medicine and Rehabilitation) [name], MD as Consulting Physician (Orthopedic Surgery) Szabat, Darnelle Maffucci, [facility] (Inactive) as Pharmacist (Pharmacist) Orson Slick, MD as Consulting Physician (Hematology and Oncology)  Hematological/Oncological History # Pancytopenia #Myelodysplastic Syndrome, Single Lineage Dysplasia 11/18/2021: WBC 2.4, ANC 1.1, Hgb 9.7, MCV 84.0, Plt 58. 12/07/2021: Establish care with Dr. Lorenso Courier and Dede Query 12/19/2021: Bone marrow biopsy which showed a low-grade myelodysplastic syndrome.  12/28/2021: started epoetin alpha shots 20,000 units q 2 weeks.   Interval History:  Justin [name] Su Grand. 81 y.o. male with medical history significant for newly diagnosed low-grade myelodysplastic syndrome who presents for a follow up visit. The patient's last visit was on 01/26/2022.  Since that time he has continued erythropoietin shots.  On exam today Mr. Coupe has continued getting erythropoietin shots every 2 weeks.  He reports that he does bruise easily but overall he feels better than he did before he started the shots.  His hemoglobin is up to 10.7 today and he is delighted by this.  He reports that he has been eating well though he is never been a really big eater.  He reports he continues to eat red meat though not very often because he is wearing dentures.  He is not having any lightheadedness, shortness of breath, dizziness, or overt signs of bleeding other than the bruising on his arms.  He denies any fevers, chills, sweats, nausea  vomiting or diarrhea.  A full 10 point ROS is listed below.  At this time he is willing and able to proceed with erythropoietin shots.   MEDICAL HISTORY:  Past Medical History:  Diagnosis Date   Alcoholism ([place])    Dry 13 years   Basal cell carcinoma    Right Chest   BPH (benign prostatic hyperplasia)    Diverticulosis of colon    Dizzy spells    none recent   DJD (degenerative joint disease)    lower back   Elbow fracture, left 2009   [name]   GERD (gastroesophageal reflux disease)    Glaucoma    History of TIAs 1 yrs ago   Hyperkeratosis    LBP (low back pain)    Macular degeneration    left wet right dry sees [name]   Melanoma [facility]) 2009   x3 [name]   Osteoarthritis    Restless leg syndrome    Thrombocytopenia ([place]) 11/18/2021   Tinnitus    Tobacco abuse    Uses walker    Wears glasses    reading   Wears partial dentures    upper    SURGICAL HISTORY: Past Surgical History:  Procedure Laterality Date   CATARACT EXTRACTION Bilateral 2018   HARDWARE REMOVAL Left 07/09/2020   Procedure: Left elbow hardware removal;  Surgeon: [name], MD;  Location: [facility];  Service: Orthopedics;  Laterality: Left;  60 mins   mda     Dr. [name] wet injection   MELANOMA EXCISION  2009   x3   RECONSTRUCTION MEDIAL COLLATERAL LIGAMENT ELBOW W/ TENDON GRAFT  2009   Left, [name]   TONSILLECTOMY  as child   TOTAL KNEE ARTHROPLASTY Right 11/09/2020   Procedure: TOTAL KNEE ARTHROPLASTY;  Surgeon: [name], MD;  Location: [facility];  Service: Orthopedics;  Laterality: Right;    SOCIAL HISTORY: Social History   Socioeconomic History   Marital status: Married    Spouse name: Not on file   Number of children: 2   Years of education: Not on file   Highest education level: Not on file  Occupational History   Occupation: [place] Veteran - [employer]    Employer: RETIRED   Occupation: Chiropodist   Occupation: Heritage manager   Tobacco Use   Smoking status: Former    Packs/day: 3.00    Years: 38.00    Total pack years: 114.00    Types: Cigarettes    Quit date: 1986    Years since quitting: 37.7   Smokeless tobacco: Never   Tobacco comments:    states he quit May 15 1985  Vaping Use   Vaping Use: Never used  Substance and Sexual Activity   Alcohol use: No    Comment: PREVIOUS - DRY 4yrs   Drug use: Not Currently   Sexual activity: Not Currently  Other Topics Concern   Not on file  Social History Narrative   Right handed   One story home   Drinks coffee q am   Social Determinants of Health   Financial Resource Strain: Low Risk  (07/25/2021)   Overall Financial Resource Strain (CARDIA)    Difficulty of Paying Living Expenses: Not hard at all  Food Insecurity: No Food Insecurity (07/25/2021)   Hunger Vital Sign    Worried About Running Out of Food in the Last Year: Never true    Ran Out of Food in the Last Year: Never true  Transportation Needs: No Transportation Needs (07/25/2021)   PRAPARE - Hydrologist (Medical): No    Lack of Transportation (Non-Medical): No  Physical Activity: Insufficiently Active (07/25/2021)   Exercise Vital Sign    Days of Exercise per Week: 3 days    Minutes of Exercise per Session: 30 min  Stress: No Stress Concern Present (07/25/2021)   [place]    Feeling of Stress : Not at all  Social Connections: Moderately Isolated (07/25/2021)   Social Connection and Isolation Panel [NHANES]    Frequency of Communication with Friends and Family: Twice a week    Frequency of Social Gatherings with Friends and Family: Twice a week    Attends Religious Services: Never    Marine scientist or Organizations: No    Attends Archivist Meetings: Never    Marital Status: Married  Human resources officer Violence: Not At Risk (07/25/2021)   Humiliation, Afraid, Rape, and Kick  questionnaire    Fear of Current or Ex-Partner: No    Emotionally Abused: No    Physically Abused: No    Sexually Abused: No    FAMILY HISTORY: Family History  Problem Relation Age of Onset   Cancer Mother        ?breast   Cancer Father        Lung    ALLERGIES:  is allergic to levofloxacin.  MEDICATIONS:  Current Outpatient Medications  Medication Sig Dispense Refill   Acetaminophen (TYLENOL PO) Tylenol     amLODipine (NORVASC) 5 MG tablet Take 1 tablet (5 mg total) by mouth daily. 90 tablet 3   calcium carbonate (TUMS EX) 750  MG chewable tablet Chew 1 tablet by mouth daily.     carbidopa-levodopa (SINEMET IR) 25-100 MG tablet For breakthrough restless leg, you can take 1 tablet at bedtime.  OK to take extra tab as needed during the day. (Patient taking differently: Take 1 tablet by mouth at bedtime.) 100 tablet 3   Cholecalciferol 1000 UNITS tablet Take 3,000 Units by mouth daily.     Cyanocobalamin (VITAMIN B-12) 1000 MCG SUBL DISSOLVE 2 TABLETS IN MOUTH EVERY DAY 180 tablet 1   ferrous sulfate 325 (65 FE) MG tablet Take 650 mg by mouth daily with breakfast.     finasteride (PROSCAR) 5 MG tablet Take 1 tablet (5 mg total) by mouth daily. 90 tablet 1   gabapentin (NEURONTIN) 300 MG capsule Take 1 capsule (300 mg total) by mouth 4 (four) times daily. Follow-up appt due in Aug must see provider for future refills 360 capsule 0   magnesium hydroxide (MILK OF MAGNESIA) 400 MG/5ML suspension Take 15 mLs by mouth daily as needed for mild constipation.     meclizine (ANTIVERT) 12.5 MG tablet TAKE 1 TABLET BY MOUTH 3 TIMES DAILY AS NEEDED FOR DIZZINESS. 60 tablet 2   Multiple Vitamins-Minerals (PRESERVISION AREDS PO) Take 2 tablets by mouth 2 (two) times daily.     Oxycodone HCl 10 MG TABS Take 10 mg by mouth 5 (five) times daily as needed (pain). Typically only uses 4 times daily     Polyethyl Glycol-Propyl Glycol (SYSTANE OP) Place 1 drop into both eyes 2 (two) times daily as needed  (dry eyes). [facility] O[facility]     pravastatin (PRAVACHOL) 20 MG tablet Take 1 tablet (20 mg total) by mouth daily. 90 tablet 1   rOPINIRole (REQUIP) 2 MG tablet TAKE 1 TABLET BY MOUTH AT 12 PM 1 TABLET AT 4 PM AND 1 TABLETS AT BEDTIME 360 tablet 1   tamsulosin (FLOMAX) 0.4 MG CAPS capsule Take 1 capsule (0.4 mg total) by mouth in the morning. 90 capsule 1   terazosin (HYTRIN) 2 MG capsule TAKE 1 CAPSULE BY MOUTH EVERYDAY AT BEDTIME 90 capsule 1   No current facility-administered medications for this visit.    REVIEW OF SYSTEMS:   Constitutional: ( - ) fevers, ( - )  chills , ( - ) night sweats Eyes: ( - ) blurriness of vision, ( - ) double vision, ( - ) watery eyes Ears, nose, mouth, throat, and face: ( - ) mucositis, ( - ) sore throat Respiratory: ( - ) cough, ( - ) dyspnea, ( - ) wheezes Cardiovascular: ( - ) palpitation, ( - ) chest discomfort, ( - ) lower extremity swelling Gastrointestinal:  ( - ) nausea, ( - ) heartburn, ( - ) change in bowel habits Skin: ( - ) abnormal skin rashes Lymphatics: ( - ) new lymphadenopathy, ( - ) easy bruising Neurological: ( - ) numbness, ( - ) tingling, ( - ) new weaknesses Behavioral/Psych: ( - ) mood change, ( - ) new changes  All other systems were reviewed with the patient and are negative.  PHYSICAL EXAMINATION:  Vitals:   03/22/22 1136  BP: (!) 154/76  Pulse: 63  Temp: 97.9 F (36.6 C)  SpO2: 99%   Filed Weights   03/22/22 1136  Weight: 173 lb 3.2 oz (78.6 kg)    GENERAL: Well-appearing elderly Caucasian male, alert, no distress and comfortable SKIN: skin color, texture, turgor are normal, no rashes or significant lesions EYES: conjunctiva are pink and non-injected, sclera clear  LUNGS: clear to auscultation and percussion with normal breathing effort HEART: regular rate & rhythm and no murmurs and no lower extremity edema Musculoskeletal: no cyanosis of digits and no clubbing  PSYCH: alert & oriented x 3, fluent speech NEURO: no focal  motor/sensory deficits  LABORATORY DATA:  I have reviewed the data as listed    Latest Ref Rng & Units 03/22/2022   11:27 AM 03/08/2022    9:57 AM 02/22/2022    9:28 AM  CBC  WBC 4.0 - 10.5 K/uL 2.6  3.5  2.4   Hemoglobin 13.0 - 17.0 g/dL 10.7  10.6  10.6   Hematocrit 39.0 - 52.0 % 33.1  32.3  32.2   Platelets 150 - 400 K/uL 49  50  37        Latest Ref Rng & Units 03/22/2022   11:27 AM 03/08/2022    9:57 AM 02/22/2022    9:28 AM  CMP  Glucose 70 - 99 mg/dL 102  101  135   BUN 8 - 23 mg/dL $Remove'23  28  21   'JLFeLeJ$ Creatinine 0.61 - 1.24 mg/dL 1.16  1.18  0.96   Sodium 135 - 145 mmol/L 139  138  139   Potassium 3.5 - 5.1 mmol/L 4.2  4.1  3.8   Chloride 98 - 111 mmol/L 107  106  107   CO2 22 - 32 mmol/L $RemoveB'28  26  28   'qIMvweWD$ Calcium 8.9 - 10.3 mg/dL 8.9  9.1  9.0   Total Protein 6.5 - 8.1 g/dL 6.9  7.1  6.7   Total Bilirubin 0.3 - 1.2 mg/dL 0.8  0.8  0.9   Alkaline Phos 38 - 126 U/L 59  60  56   AST 15 - 41 U/L $Remo'17  19  19   'iqDEO$ ALT 0 - 44 U/L <5  <5  7     Lab Results  Component Value Date   MPROTEIN Not Observed 12/07/2021   Lab Results  Component Value Date   KPAFRELGTCHN 45.3 (H) 12/07/2021   LAMBDASER 28.9 (H) 12/07/2021   KAPLAMBRATIO 1.57 12/07/2021   RADIOGRAPHIC STUDIES: No results found.  Justin [name]. 81 y.o. male with medical history significant for newly diagnosed low-grade myelodysplastic syndrome who presents for a follow up visit.  After review the labs, review the records, discussion with the patient the findings are most consistent with myelodysplastic syndrome.  This is confirmed with bone marrow biopsy.  Given that his major issue is a drop in the hemoglobin would recommend pursuing erythropoietin therapy.  Also discussed other options including hypomethylating's as well as Luspatercept.  Given that he is not currently transfusion dependent I think we can begin with erythropoietin and consider the other options if his counts were to worsen.   Additionally research has shown that erythropoietin may actually boost his platelets and white blood cell count in the process.  The patient voiced understanding of the plan moving forward.  R-IPSS: Low   # Myelodysplastic Syndrome, Single Lineage Dysplasia -- At this time Mr. Cygan has a low-grade myelodysplasia that is anemia predominant. --Given that his hemoglobin was consistently less than 10 I recommended we proceed with erythropoietin therapy. --Discussed with the patient other options including hypomethylating therapies and Luspatercept, however given that anemia is his primary issue would recommend erythropoietin to see if this helps to elevate his other blood counts as well. --No clear signs of nutritional deficiency during our prior evaluation. - MDS FISH study  showed no 5q-. Normal molecular studies.  Plan:  --labs today show white blood cell count 2.6, hemoglobin 10.7, platelets of 49 --Offered to drop down to once monthly shots given his steadily improved hemoglobin.  He notes he would like to continue every 2 weeks. --RTC in 8 weeks time to assure he is tolerating the shots with continued q 2 week shots.   No orders of the defined types were placed in this encounter.   All questions were answered. The patient knows to call the clinic with any problems, questions or concerns.  A total of more than 30 minutes were spent on this encounter with face-to-face time and non-face-to-face time, including preparing to see the patient, ordering tests and/or medications, counseling the patient and coordination of care as outlined above.   Ledell Peoples, MD Department of Hematology/Oncology [place] [facility] Phone: [phone] Pager: [phone] Email: [email or web address]  03/22/2022 5:18 PM

## 2022-04-03 DIAGNOSIS — H578A1 Foreign body sensation, right eye: Secondary | ICD-10-CM | POA: Diagnosis not present

## 2022-04-05 ENCOUNTER — Inpatient Hospital Stay: Payer: PPO | Attending: Physician Assistant

## 2022-04-05 ENCOUNTER — Inpatient Hospital Stay: Payer: PPO

## 2022-04-05 ENCOUNTER — Telehealth: Payer: Self-pay | Admitting: *Deleted

## 2022-04-05 ENCOUNTER — Other Ambulatory Visit: Payer: Self-pay

## 2022-04-05 DIAGNOSIS — D469 Myelodysplastic syndrome, unspecified: Secondary | ICD-10-CM

## 2022-04-05 LAB — CMP (CANCER CENTER ONLY)
ALT: 6 U/L (ref 0–44)
AST: 18 U/L (ref 15–41)
Albumin: 4 g/dL (ref 3.5–5.0)
Alkaline Phosphatase: 61 U/L (ref 38–126)
Anion gap: 6 (ref 5–15)
BUN: 23 mg/dL (ref 8–23)
CO2: 26 mmol/L (ref 22–32)
Calcium: 8.9 mg/dL (ref 8.9–10.3)
Chloride: 108 mmol/L (ref 98–111)
Creatinine: 1.21 mg/dL (ref 0.61–1.24)
GFR, Estimated: 60 mL/min — ABNORMAL LOW (ref >60)
Glucose, Bld: 104 mg/dL — ABNORMAL HIGH (ref 70–99)
Potassium: 3.9 mmol/L (ref 3.5–5.1)
Sodium: 140 mmol/L (ref 135–145)
Total Bilirubin: 0.6 mg/dL (ref 0.3–1.2)
Total Protein: 6.4 g/dL — ABNORMAL LOW (ref 6.5–8.1)

## 2022-04-05 LAB — CBC WITH DIFFERENTIAL (CANCER CENTER ONLY)
Abs Immature Granulocytes: 0.02 10*3/uL (ref 0.00–0.07)
Basophils Absolute: 0 10*3/uL (ref 0.0–0.1)
Basophils Relative: 0 %
Eosinophils Absolute: 0 10*3/uL (ref 0.0–0.5)
Eosinophils Relative: 1 %
HCT: 30.8 % — ABNORMAL LOW (ref 39.0–52.0)
Hemoglobin: 10 g/dL — ABNORMAL LOW (ref 13.0–17.0)
Immature Granulocytes: 1 %
Lymphocytes Relative: 36 %
Lymphs Abs: 1.1 10*3/uL (ref 0.7–4.0)
MCH: 26.5 pg (ref 26.0–34.0)
MCHC: 32.5 g/dL (ref 30.0–36.0)
MCV: 81.5 fL (ref 80.0–100.0)
Monocytes Absolute: 0.6 10*3/uL (ref 0.1–1.0)
Monocytes Relative: 18 %
Neutro Abs: 1.5 10*3/uL — ABNORMAL LOW (ref 1.7–7.7)
Neutrophils Relative %: 44 %
Platelet Count: 40 10*3/uL — ABNORMAL LOW (ref 150–400)
RBC: 3.78 MIL/uL — ABNORMAL LOW (ref 4.22–5.81)
RDW: 15.4 % (ref 11.5–15.5)
WBC Count: 3.2 10*3/uL — ABNORMAL LOW (ref 4.0–10.5)
nRBC: 0 % (ref 0.0–0.2)

## 2022-04-05 MED ORDER — EPOETIN ALFA-EPBX 20000 UNIT/ML IJ SOLN
20000.0000 [IU] | Freq: Once | INTRAMUSCULAR | Status: AC
  Administered 2022-04-05: 20000 [IU] via SUBCUTANEOUS
  Filled 2022-04-05: qty 1

## 2022-04-05 NOTE — Telephone Encounter (Signed)
Received call from pt. He is concerned about his labs. His GHB went from 10.7 to 10.0 and his platelets went from 49k to 40k. He comes here for Epo injection every 2 weeks.[name] He is asking if there is anything else he needs to do.   Please advise

## 2022-04-06 ENCOUNTER — Encounter: Payer: Self-pay | Admitting: Hematology and Oncology

## 2022-04-10 ENCOUNTER — Other Ambulatory Visit: Payer: Self-pay | Admitting: Internal Medicine

## 2022-04-10 ENCOUNTER — Telehealth: Payer: Self-pay | Admitting: *Deleted

## 2022-04-10 DIAGNOSIS — M5416 Radiculopathy, lumbar region: Secondary | ICD-10-CM | POA: Diagnosis not present

## 2022-04-10 NOTE — Telephone Encounter (Signed)
TCT patient regarding pt's concerns about his labs. Per Dr. Lorenso Courier:   "we will continue to watch his counts closely. There are no additionally recommendations at his current blood counts. Please have him call with any bleeding, worsening fatigue, or other new/concerning symptoms."  Pt voiced understanding

## 2022-04-12 ENCOUNTER — Other Ambulatory Visit: Payer: Self-pay | Admitting: *Deleted

## 2022-04-12 MED ORDER — ROPINIROLE HCL 2 MG PO TABS: ORAL_TABLET | ORAL | 1 refills | Status: DC

## 2022-04-19 ENCOUNTER — Other Ambulatory Visit: Payer: Self-pay

## 2022-04-19 ENCOUNTER — Inpatient Hospital Stay: Payer: PPO

## 2022-04-19 ENCOUNTER — Ambulatory Visit: Payer: PPO | Admitting: Internal Medicine

## 2022-04-19 VITALS — BP 155/77 | HR 56 | Temp 97.8°F | Resp 17

## 2022-04-19 DIAGNOSIS — D469 Myelodysplastic syndrome, unspecified: Secondary | ICD-10-CM

## 2022-04-19 LAB — CBC WITH DIFFERENTIAL (CANCER CENTER ONLY)
Abs Immature Granulocytes: 0.01 10*3/uL (ref 0.00–0.07)
Basophils Absolute: 0 10*3/uL (ref 0.0–0.1)
Basophils Relative: 0 %
Eosinophils Absolute: 0 10*3/uL (ref 0.0–0.5)
Eosinophils Relative: 1 %
HCT: 32.3 % — ABNORMAL LOW (ref 39.0–52.0)
Hemoglobin: 10.4 g/dL — ABNORMAL LOW (ref 13.0–17.0)
Immature Granulocytes: 0 %
Lymphocytes Relative: 35 %
Lymphs Abs: 1.1 10*3/uL (ref 0.7–4.0)
MCH: 26.1 pg (ref 26.0–34.0)
MCHC: 32.2 g/dL (ref 30.0–36.0)
MCV: 81.2 fL (ref 80.0–100.0)
Monocytes Absolute: 0.5 10*3/uL (ref 0.1–1.0)
Monocytes Relative: 16 %
Neutro Abs: 1.4 10*3/uL — ABNORMAL LOW (ref 1.7–7.7)
Neutrophils Relative %: 48 %
Platelet Count: 39 10*3/uL — ABNORMAL LOW (ref 150–400)
RBC: 3.98 MIL/uL — ABNORMAL LOW (ref 4.22–5.81)
RDW: 15.6 % — ABNORMAL HIGH (ref 11.5–15.5)
WBC Count: 3 10*3/uL — ABNORMAL LOW (ref 4.0–10.5)
nRBC: 0 % (ref 0.0–0.2)

## 2022-04-19 LAB — CMP (CANCER CENTER ONLY)
ALT: 7 U/L (ref 0–44)
AST: 18 U/L (ref 15–41)
Albumin: 4.1 g/dL (ref 3.5–5.0)
Alkaline Phosphatase: 62 U/L (ref 38–126)
Anion gap: 3 — ABNORMAL LOW (ref 5–15)
BUN: 22 mg/dL (ref 8–23)
CO2: 29 mmol/L (ref 22–32)
Calcium: 9 mg/dL (ref 8.9–10.3)
Chloride: 107 mmol/L (ref 98–111)
Creatinine: 1.03 mg/dL (ref 0.61–1.24)
GFR, Estimated: >60 mL/min (ref >60)
Glucose, Bld: 105 mg/dL — ABNORMAL HIGH (ref 70–99)
Potassium: 4.2 mmol/L (ref 3.5–5.1)
Sodium: 139 mmol/L (ref 135–145)
Total Bilirubin: 0.8 mg/dL (ref 0.3–1.2)
Total Protein: 6.9 g/dL (ref 6.5–8.1)

## 2022-04-19 MED ORDER — EPOETIN ALFA-EPBX 20000 UNIT/ML IJ SOLN
20000.0000 [IU] | Freq: Once | INTRAMUSCULAR | Status: AC
  Administered 2022-04-19: 20000 [IU] via SUBCUTANEOUS
  Filled 2022-04-19: qty 1

## 2022-04-19 NOTE — Patient Instructions (Signed)

## 2022-04-20 NOTE — Progress Notes (Signed)
[facility] Note  04/24/2022     CHIEF COMPLAINT Patient presents for Retina Evaluation   HISTORY OF PRESENT ILLNESS: Justin Ozment. is a 81 y.o. male who presents to the clinic today for:   HPI     Retina Evaluation   In left eye.  This started 10 weeks ago.  Duration of 10 weeks.  I, the attending physician,  performed the HPI with the patient and updated documentation appropriately.        Comments   Retina eval pt is seeing [name] his last Eyelea injection was 10 weeks ago OS pt states not vision changes noticed       Last edited by [name], MD on 04/24/2022  8:55 AM.    Pt is a previous Dr. [name] pt who received Eylea injections every 10 weeks, pts last injection was 08.16.23, pt states he has been seeing Dr. [name] for about 6 years, pt feels like vision in the left eye is stable, pt states he takes AREDS 2, he has not checked his vision on an A[place]ler grid in years  Referring physician: [name], MD [place],   [id number]  HISTORICAL INFORMATION:   Selected notes from the MEDICAL RECORD NUMBER Previous Dr. [name] pt LEE:  Ocular Hx- PMH-    CURRENT MEDICATIONS: Current Outpatient Medications (Ophthalmic Drugs)  Medication Sig   Polyethyl Glycol-Propyl Glycol (SYSTANE OP) Place 1 drop into both eyes 2 (two) times daily as needed (dry eyes). [facility] OTC   No current facility-administered medications for this visit. (Ophthalmic Drugs)   Current Outpatient Medications (Other)  Medication Sig   Acetaminophen (TYLENOL PO) Tylenol   amLODipine (NORVASC) 5 MG tablet Take 1 tablet (5 mg total) by mouth daily.   calcium carbonate (TUMS EX) 750 MG chewable tablet Chew 1 tablet by mouth daily.   carbidopa-levodopa (SINEMET IR) 25-100 MG tablet For breakthrough restless leg, you can take 1 tablet at bedtime.  OK to take extra tab as needed during the day. (Patient taking differently: Take 1  tablet by mouth at bedtime.)   Cholecalciferol 1000 UNITS tablet Take 3,000 Units by mouth daily.   Cyanocobalamin (VITAMIN B-12) 1000 MCG SUBL DISSOLVE 2 TABLETS IN MOUTH EVERY DAY   ferrous sulfate 325 (65 FE) MG tablet Take 650 mg by mouth daily with breakfast.   finasteride (PROSCAR) 5 MG tablet Take 1 tablet (5 mg total) by mouth daily.   gabapentin (NEURONTIN) 300 MG capsule TAKE 1 CAPSULE BY MOUTH FOUR TIMES A DAY (FOLLOW UP DUE IN AUG)   magnesium hydroxide (MILK OF MAGNESIA) 400 MG/5ML suspension Take 15 mLs by mouth daily as needed for mild constipation.   meclizine (ANTIVERT) 12.5 MG tablet TAKE 1 TABLET BY MOUTH 3 TIMES DAILY AS NEEDED FOR DIZZINESS.   Multiple Vitamins-Minerals (PRESERVISION AREDS PO) Take 2 tablets by mouth 2 (two) times daily.   Oxycodone HCl 10 MG TABS Take 10 mg by mouth 5 (five) times daily as needed (pain). Typically only uses 4 times daily   pravastatin (PRAVACHOL) 20 MG tablet Take 1 tablet (20 mg total) by mouth daily.   rOPINIRole (REQUIP) 2 MG tablet TAKE 1 TABLET BY MOUTH AT 12 PM 1 TABLET AT 4 PM AND 1 TABLETS AT BEDTIME   tamsulosin (FLOMAX) 0.4 MG CAPS capsule Take 1 capsule (0.4 mg total) by mouth in the morning.   terazosin (HYTRIN) 2 MG capsule TAKE 1 CAPSULE BY  MOUTH EVERYDAY AT BEDTIME   No current facility-administered medications for this visit. (Other)   REVIEW OF SYSTEMS:   ALLERGIES Allergies  Allergen Reactions   Levofloxacin     REACTION: hands pealed   PAST MEDICAL HISTORY Past Medical History:  Diagnosis Date   Alcoholism ([place])    Dry 13 years   Basal cell carcinoma    Right Chest   BPH (benign prostatic hyperplasia)    Diverticulosis of colon    Dizzy spells    none recent   DJD (degenerative joint disease)    lower back   Elbow fracture, left 2009   [name]   GERD (gastroesophageal reflux disease)    Glaucoma    History of TIAs 1 yrs ago   Hyperkeratosis    LBP (low back pain)    Macular degeneration    left  wet right dry sees [name]   Melanoma [facility]) 2009   x3 [name]   Osteoarthritis    Restless leg syndrome    Thrombocytopenia ([place]) 11/18/2021   Tinnitus    Tobacco abuse    Uses walker    Wears glasses    reading   Wears partial dentures    upper   Past Surgical History:  Procedure Laterality Date   CATARACT EXTRACTION Bilateral 2018   HARDWARE REMOVAL Left 07/09/2020   Procedure: Left elbow hardware removal;  Surgeon: [name], MD;  Location: [facility];  Service: Orthopedics;  Laterality: Left;  60 mins   mda     Dr. [name] wet injection   MELANOMA EXCISION  2009   x3   RECONSTRUCTION MEDIAL COLLATERAL LIGAMENT ELBOW W/ TENDON GRAFT  2009   Left, [name]   TONSILLECTOMY  as child   TOTAL KNEE ARTHROPLASTY Right 11/09/2020   Procedure: TOTAL KNEE ARTHROPLASTY;  Surgeon: [name], MD;  Location: [facility];  Service: Orthopedics;  Laterality: Right;   FAMILY HISTORY Family History  Problem Relation Age of Onset   Cancer Mother        ?breast   Cancer Father        Lung   SOCIAL HISTORY Social History   Tobacco Use   Smoking status: Former    Packs/day: 3.00    Years: 38.00    Total pack years: 114.00    Types: Cigarettes    Quit date: 1986    Years since quitting: 37.8   Smokeless tobacco: Never   Tobacco comments:    states he quit May 15 1985  Vaping Use   Vaping Use: Never used  Substance Use Topics   Alcohol use: No    Comment: PREVIOUS - DRY 67yr   Drug use: Not Currently       OPHTHALMIC EXAM:  Base Eye Exam     Visual Acuity (Snellen - Linear)       Right Left   Dist [place] 20/25 -2 20/150 -2   Dist ph [place] NI 20/70 -2         Tonometry (Tonopen, 8:27 AM)       Right Left   Pressure 12 13         Pupils       Pupils Dark Light Shape React APD   Right PERRL 3 2 Round Brisk None   Left PERRL 3 2 Round Brisk None         Visual Fields       Left Right    Full Full  Extraocular  Movement       Right Left    Full, Ortho Full, Ortho         Neuro/Psych     Oriented x3: Yes   Mood/Affect: Normal         Dilation     Both eyes: 2.5% Phenylephrine @ 8:27 AM           Slit Lamp and Fundus Exam     External Exam       Right Left   External Normal Normal         Slit Lamp Exam       Right Left   Lids/Lashes Dermatochalasis - upper lid, Meibomian gland dysfunction Dermatochalasis - upper lid, Meibomian gland dysfunction   Conjunctiva/Sclera White and quiet White and quiet   Cornea arcus, 2+ inferior Punctate epithelial erosions, well healed cataract wound arcus, 1+ inferior Punctate epithelial erosions, well healed cataract wound, 1+ fine endo pigment   Anterior Chamber deep, clear, narrow temporal angle deep, clear, narrow temporal angle   Iris Round and dilated, patent PI at 1100 Round and dilated, 2 patent PI's at 1130 and 1215   Lens PC IOL in good position PC IOL in good position, 1+ Posterior capsular opacification         Fundus Exam       Right Left   Posterior Vitreous Mild syneresis Mild syneresis, silivone oil micro bubbles, Posterior vitreous detachment, vitreous condensations   Disc Pink and Sharp mild Pallor, Sharp rim, narrow superior rim   C/D Ratio 0.65 0.7   Macula Flat, Blunted foveal reflex, Drusen, RPE mottling and clumping, central vitelliform like lesion with central pigment clumping Flat, Blunted foveal reflex, Drusen, RPE mottling and clumping, nasal CNV with mild shallow SRF   Vessels mild attenuation, mild tortuosity attenuated, mild copper wiring, mild tortuosity   Periphery Attached, No heme Attached, No heme           Refraction     Manifest Refraction       Sphere Cylinder Dist VA   Right      Left +0.25 Sphere 20/80-2           IMAGING AND PROCEDURES  Imaging and Procedures for 04/24/2022  OCT, Retina - OU - Both Eyes       Right Eye Quality was good. Central Foveal Thickness: 282.  Progression has no prior data. Findings include normal foveal contour, no IRF, no SRF, retinal drusen , subretinal hyper-reflective material, pigment epithelial detachment [facility] / vitelliform like lesion).   Left Eye Central Foveal Thickness: 227. Progression has no prior data. Findings include abnormal foveal contour, subretinal hyper-reflective material, intraretinal fluid, pigment epithelial detachment, subretinal fluid (Shallow PED / CNVM w/ shallow SRF nasal mac).   Notes *Images captured and stored on drive  Diagnosis / Impression:  OD: non-exu ARMD -- Central SRHM / vitelliform like lesion OS: exu ARMD -- Shallow PED / CNVM w/ shallow SRF nasal mac  Clinical management:  See below  Abbreviations: NFP - Normal foveal profile. CME - cystoid macular edema. PED - pigment epithelial detachment. IRF - intraretinal fluid. SRF - subretinal fluid. EZ - ellipsoid zone. ERM - epiretinal membrane. ORA - outer retinal atrophy. ORT - outer retinal tubulation. SRHM - subretinal hyper-reflective material. IRHM - intraretinal hyper-reflective material      Intravitreal Injection, Pharmacologic Agent - OS - Left Eye       Time Out 04/24/2022. 8:41 AM. Confirmed correct patient, procedure, site,  and patient consented.   Anesthesia Topical anesthesia was used. Anesthetic medications included Lidocaine 2%, Proparacaine 0.5%.   Procedure Preparation included 5% betadine to ocular surface, eyelid speculum. A (32g) needle was used.   Injection: 2 mg aflibercept 2 MG/0.05ML   Route: Intravitreal, Site: Left Eye   NDC: [id number], Lot: [id number], Expiration date: 08/03/2023, Waste: 0 mL   Post-op Post injection exam found visual acuity of at least counting fingers. The patient tolerated the procedure well. There were no complications. The patient received written and verbal post procedure care education.            ASSESSMENT/PLAN:    ICD-10-CM   1. Exudative age-related  macular degeneration of left eye with active choroidal neovascularization (HCC)  H35.3221 OCT, Retina - OU - Both Eyes    Intravitreal Injection, Pharmacologic Agent - OS - Left Eye    aflibercept (EYLEA) SOLN 2 mg    2. Intermediate stage nonexudative age-related macular degeneration of right eye  H35.3112     3. Essential hypertension  I10     4. Hypertensive retinopathy of both eyes  H35.033     5. Pseudophakia of both eyes  Z96.1      Exudative age related macular degeneration, OS - pt transferring care from Dr. [name] due to insurance  - per Epic chart review, pt has had IVE x16 OS with Dr. [name] from 04.14.21-08.16.23  - currently on q10 wk schedule  - The incidence pathology and anatomy of wet AMD discussed   - discussed treatment options including observation vs intravitreal anti-VEGF agents such as Avastin, Lucentis, Eylea.    - Risks of endophthalmitis and vascular occlusive events and atrophic changes discussed with patient  - OCT OS shows Shallow PED / CNVM w/ shallow SRF nasal mac at 10 wks  - recommend IVE OS #17 today, 10.23.23 with follow up in 8 weeks - pt wishes to be treated with IVE - RBA of procedure discussed, questions answered - informed consent obtained and signed - see procedure note  - f/u in 8 wks -- DFE/OCT, possible injection  2. Age related macular degeneration, non-exudative, right eye  - The incidence, anatomy, and pathology of dry AMD, risk of progression, and the AREDS and AREDS 2 study including smoking risks discussed with patient.  - Recommend amsler grid monitoring  - f/u 3 months, DFE, OCT  3,4. Hypertensive retinopathy OU - discussed importance of tight BP control - monitor  5. Pseudophakia OU  - s/p CE/IOL  - IOL in good position, doing well  - monitor  Ophthalmic Meds Ordered this visit:  Meds ordered this encounter  Medications   aflibercept (EYLEA) SOLN 2 mg     Return in about 8 weeks (around 06/19/2022) for f/u exu ARMD  OS, DFE, OCT.  There are no Patient Instructions on file for this visit.  Explained the diagnoses, plan, and follow up with the patient and they expressed understanding.  Patient expressed understanding of the importance of proper follow up care.   This document serves as a record of services personally performed by Gardiner Sleeper, MD, PhD. It was created on their behalf by Roselee Nova, COMT. The creation of this record is the provider's dictation and/or activities during the visit.  Electronically signed by: Roselee Nova, COMT 04/24/22 10:03 AM  This document serves as a record of services personally performed by Gardiner Sleeper, MD, PhD. It was created on their behalf by San Jetty. Owens Shark, OA an ophthalmic technician.  The creation of this record is the provider's dictation and/or activities during the visit.    Electronically signed by: San Jetty. [place], [place] 10.23.2023 10:03 AM  Gardiner Sleeper, M.D., Ph.D. Diseases & Surgery of the Retina and Vitreous [facility]  I have reviewed the above documentation for accuracy and completeness, and I agree with the above. Gardiner Sleeper, M.D., Ph.D. 04/24/22 10:08 AM  Abbreviations: M myopia (nearsighted); A astigmatism; H hyperopia (farsighted); P presbyopia; Mrx spectacle prescription;  CTL contact lenses; OD right eye; OS left eye; OU both eyes  XT exotropia; ET esotropia; PEK punctate epithelial keratitis; PEE punctate epithelial erosions; DES dry eye syndrome; MGD meibomian gland dysfunction; ATs artificial tears; PFAT's preservative free artificial tears; [place] nuclear sclerotic cataract; PSC posterior subcapsular cataract; ERM epi-retinal membrane; PVD posterior vitreous detachment; RD retinal detachment; DM diabetes mellitus; DR diabetic retinopathy; NPDR non-proliferative diabetic retinopathy; PDR proliferative diabetic retinopathy; CSME clinically significant macular edema; DME diabetic macular edema; dbh dot blot  hemorrhages; CWS cotton wool spot; POAG primary open angle glaucoma; C/D cup-to-disc ratio; HVF humphrey visual field; GVF goldmann visual field; OCT optical coherence tomography; IOP intraocular pressure; BRVO Branch retinal vein occlusion; CRVO central retinal vein occlusion; CRAO central retinal artery occlusion; BRAO branch retinal artery occlusion; RT retinal tear; SB scleral buckle; PPV pars plana vitrectomy; VH Vitreous hemorrhage; PRP panretinal laser photocoagulation; IVK intravitreal kenalog; VMT vitreomacular traction; MH Macular hole;  NVD neovascularization of the disc; NVE neovascularization elsewhere; AREDS age related eye disease study; ARMD age related macular degeneration; POAG primary open angle glaucoma; EBMD epithelial/anterior basement membrane dystrophy; ACIOL anterior chamber intraocular lens; IOL intraocular lens; PCIOL posterior chamber intraocular lens; Phaco/IOL phacoemulsification with intraocular lens placement; [place] photorefractive keratectomy; LASIK laser assisted in situ keratomileusis; HTN hypertension; DM diabetes mellitus; COPD chronic obstructive pulmonary disease

## 2022-04-24 ENCOUNTER — Ambulatory Visit (INDEPENDENT_AMBULATORY_CARE_PROVIDER_SITE_OTHER): Payer: PPO | Admitting: Ophthalmology

## 2022-04-24 ENCOUNTER — Encounter (INDEPENDENT_AMBULATORY_CARE_PROVIDER_SITE_OTHER): Payer: Self-pay | Admitting: Ophthalmology

## 2022-04-24 DIAGNOSIS — H353221 Exudative age-related macular degeneration, left eye, with active choroidal neovascularization: Secondary | ICD-10-CM

## 2022-04-24 DIAGNOSIS — H35033 Hypertensive retinopathy, bilateral: Secondary | ICD-10-CM | POA: Diagnosis not present

## 2022-04-24 DIAGNOSIS — I10 Essential (primary) hypertension: Secondary | ICD-10-CM | POA: Diagnosis not present

## 2022-04-24 DIAGNOSIS — Z961 Presence of intraocular lens: Secondary | ICD-10-CM | POA: Diagnosis not present

## 2022-04-24 DIAGNOSIS — H353112 Nonexudative age-related macular degeneration, right eye, intermediate dry stage: Secondary | ICD-10-CM

## 2022-04-24 DIAGNOSIS — M5416 Radiculopathy, lumbar region: Secondary | ICD-10-CM | POA: Diagnosis not present

## 2022-04-24 MED ORDER — AFLIBERCEPT 2MG/0.05ML IZ SOLN FOR KALEIDOSCOPE
2.0000 mg | INTRAVITREAL | Status: AC | PRN
  Administered 2022-04-24: 2 mg via INTRAVITREAL

## 2022-04-25 ENCOUNTER — Ambulatory Visit (INDEPENDENT_AMBULATORY_CARE_PROVIDER_SITE_OTHER): Payer: PPO | Admitting: *Deleted

## 2022-04-25 DIAGNOSIS — Z23 Encounter for immunization: Secondary | ICD-10-CM

## 2022-04-26 ENCOUNTER — Encounter (INDEPENDENT_AMBULATORY_CARE_PROVIDER_SITE_OTHER): Payer: PPO | Admitting: Ophthalmology

## 2022-05-02 ENCOUNTER — Ambulatory Visit (INDEPENDENT_AMBULATORY_CARE_PROVIDER_SITE_OTHER): Payer: PPO | Admitting: Family Medicine

## 2022-05-02 ENCOUNTER — Encounter: Payer: Self-pay | Admitting: Family Medicine

## 2022-05-02 VITALS — BP 158/72 | HR 73 | Temp 97.8°F | Ht 68.5 in | Wt 178.0 lb

## 2022-05-02 DIAGNOSIS — J0140 Acute pansinusitis, unspecified: Secondary | ICD-10-CM

## 2022-05-02 DIAGNOSIS — R051 Acute cough: Secondary | ICD-10-CM

## 2022-05-02 MED ORDER — AMOXICILLIN 875 MG PO TABS: 875.0000 mg | ORAL_TABLET | Freq: Two times a day (BID) | ORAL | 0 refills | Status: AC

## 2022-05-02 NOTE — Progress Notes (Signed)
Subjective:  Justin [name]. is a 81 y.o. male who presents for nasal congestion, rhinorrhea, chest congestion, cough that productive of thick sputum.   Denies fever, chills, dizziness, chest pain, shortness of breath, N/V/D.   Negative Covid test.   No other aggravating or relieving factors.  No other c/o.  ROS as in subjective.   Objective: Vitals:   05/02/22 1145  BP: (!) 158/72  Pulse: 73  Temp: 97.8 F (36.6 C)  SpO2: 97%    General appearance: Alert, WD/WN, no distress, mildly ill appearing                             Skin: warm, no rash                           Head: no sinus tenderness                            Eyes: conjunctiva normal, corneas clear, PERRLA                            Ears: external ear canals normal                          Nose: septum midline, erythema and clear discharge             Mouth/throat: MMM,                            Neck: supple, ROM normal                           Heart: RRR                         Lungs: CTA bilaterally, no wheezes, rales, or rhonchi      Assessment: Acute non-recurrent pansinusitis - Plan: amoxicillin (AMOXIL) 875 MG tablet  Acute cough   Plan: Amoxicillin prescribed. May take Claritin and Coricidin cough medicine.   Suggested symptomatic OTC remedies. Nasal saline spray for congestion.  Tylenol OTC prn.  Call/return in 3-4 days if symptoms aren't resolving or if worsening.

## 2022-05-02 NOTE — Patient Instructions (Signed)
Take the antibiotic as prescribed.  Stay well-hydrated.  Rest when you feel tired.  You may take Claritin over-the-counter for runny nose and nasal congestion.  Coricidin cough medication is fine.  Follow-up if you are getting worse or if you are not back to baseline when you complete the antibiotic.

## 2022-05-03 ENCOUNTER — Inpatient Hospital Stay: Payer: PPO | Attending: Physician Assistant

## 2022-05-03 ENCOUNTER — Inpatient Hospital Stay: Payer: PPO

## 2022-05-03 ENCOUNTER — Other Ambulatory Visit: Payer: Self-pay

## 2022-05-03 VITALS — BP 147/73 | HR 66 | Temp 97.9°F | Resp 18

## 2022-05-03 DIAGNOSIS — D469 Myelodysplastic syndrome, unspecified: Secondary | ICD-10-CM | POA: Insufficient documentation

## 2022-05-03 LAB — CBC WITH DIFFERENTIAL (CANCER CENTER ONLY)
Abs Immature Granulocytes: 0.03 10*3/uL (ref 0.00–0.07)
Basophils Absolute: 0 10*3/uL (ref 0.0–0.1)
Basophils Relative: 0 %
Eosinophils Absolute: 0 10*3/uL (ref 0.0–0.5)
Eosinophils Relative: 1 %
HCT: 31.4 % — ABNORMAL LOW (ref 39.0–52.0)
Hemoglobin: 10.2 g/dL — ABNORMAL LOW (ref 13.0–17.0)
Immature Granulocytes: 1 %
Lymphocytes Relative: 17 %
Lymphs Abs: 0.9 10*3/uL (ref 0.7–4.0)
MCH: 26.2 pg (ref 26.0–34.0)
MCHC: 32.5 g/dL (ref 30.0–36.0)
MCV: 80.5 fL (ref 80.0–100.0)
Monocytes Absolute: 0.7 10*3/uL (ref 0.1–1.0)
Monocytes Relative: 15 %
Neutro Abs: 3.4 10*3/uL (ref 1.7–7.7)
Neutrophils Relative %: 66 %
Platelet Count: 60 10*3/uL — ABNORMAL LOW (ref 150–400)
RBC: 3.9 MIL/uL — ABNORMAL LOW (ref 4.22–5.81)
RDW: 16.1 % — ABNORMAL HIGH (ref 11.5–15.5)
WBC Count: 5 10*3/uL (ref 4.0–10.5)
nRBC: 0 % (ref 0.0–0.2)

## 2022-05-03 LAB — CMP (CANCER CENTER ONLY)
ALT: 6 U/L (ref 0–44)
AST: 16 U/L (ref 15–41)
Albumin: 4 g/dL (ref 3.5–5.0)
Alkaline Phosphatase: 63 U/L (ref 38–126)
Anion gap: 6 (ref 5–15)
BUN: 21 mg/dL (ref 8–23)
CO2: 26 mmol/L (ref 22–32)
Calcium: 8.8 mg/dL — ABNORMAL LOW (ref 8.9–10.3)
Chloride: 107 mmol/L (ref 98–111)
Creatinine: 1.06 mg/dL (ref 0.61–1.24)
GFR, Estimated: >60 mL/min (ref >60)
Glucose, Bld: 107 mg/dL — ABNORMAL HIGH (ref 70–99)
Potassium: 3.7 mmol/L (ref 3.5–5.1)
Sodium: 139 mmol/L (ref 135–145)
Total Bilirubin: 0.7 mg/dL (ref 0.3–1.2)
Total Protein: 7.3 g/dL (ref 6.5–8.1)

## 2022-05-03 MED ORDER — EPOETIN ALFA-EPBX 20000 UNIT/ML IJ SOLN
20000.0000 [IU] | Freq: Once | INTRAMUSCULAR | Status: AC
  Administered 2022-05-03: 20000 [IU] via SUBCUTANEOUS
  Filled 2022-05-03: qty 1

## 2022-05-08 DIAGNOSIS — M5416 Radiculopathy, lumbar region: Secondary | ICD-10-CM | POA: Diagnosis not present

## 2022-05-17 DIAGNOSIS — Z85828 Personal history of other malignant neoplasm of skin: Secondary | ICD-10-CM | POA: Diagnosis not present

## 2022-05-17 DIAGNOSIS — D225 Melanocytic nevi of trunk: Secondary | ICD-10-CM | POA: Diagnosis not present

## 2022-05-17 DIAGNOSIS — L570 Actinic keratosis: Secondary | ICD-10-CM | POA: Diagnosis not present

## 2022-05-17 DIAGNOSIS — C4442 Squamous cell carcinoma of skin of scalp and neck: Secondary | ICD-10-CM | POA: Diagnosis not present

## 2022-05-17 DIAGNOSIS — D692 Other nonthrombocytopenic purpura: Secondary | ICD-10-CM | POA: Diagnosis not present

## 2022-05-17 DIAGNOSIS — L817 Pigmented purpuric dermatosis: Secondary | ICD-10-CM | POA: Diagnosis not present

## 2022-05-17 DIAGNOSIS — D2271 Melanocytic nevi of right lower limb, including hip: Secondary | ICD-10-CM | POA: Diagnosis not present

## 2022-05-18 ENCOUNTER — Inpatient Hospital Stay: Payer: PPO

## 2022-05-18 ENCOUNTER — Inpatient Hospital Stay (HOSPITAL_BASED_OUTPATIENT_CLINIC_OR_DEPARTMENT_OTHER): Payer: PPO | Admitting: Hematology and Oncology

## 2022-05-18 ENCOUNTER — Other Ambulatory Visit: Payer: Self-pay

## 2022-05-18 VITALS — BP 150/71 | HR 62 | Temp 97.3°F | Resp 14 | Wt 172.9 lb

## 2022-05-18 DIAGNOSIS — D469 Myelodysplastic syndrome, unspecified: Secondary | ICD-10-CM | POA: Diagnosis not present

## 2022-05-18 DIAGNOSIS — D61818 Other pancytopenia: Secondary | ICD-10-CM

## 2022-05-18 LAB — CMP (CANCER CENTER ONLY)
ALT: <5 U/L (ref 0–44)
AST: 20 U/L (ref 15–41)
Albumin: 4.3 g/dL (ref 3.5–5.0)
Alkaline Phosphatase: 60 U/L (ref 38–126)
Anion gap: 7 (ref 5–15)
BUN: 27 mg/dL — ABNORMAL HIGH (ref 8–23)
CO2: 27 mmol/L (ref 22–32)
Calcium: 9.3 mg/dL (ref 8.9–10.3)
Chloride: 106 mmol/L (ref 98–111)
Creatinine: 1.26 mg/dL — ABNORMAL HIGH (ref 0.61–1.24)
GFR, Estimated: 57 mL/min — ABNORMAL LOW (ref >60)
Glucose, Bld: 140 mg/dL — ABNORMAL HIGH (ref 70–99)
Potassium: 4.1 mmol/L (ref 3.5–5.1)
Sodium: 140 mmol/L (ref 135–145)
Total Bilirubin: 0.8 mg/dL (ref 0.3–1.2)
Total Protein: 7.4 g/dL (ref 6.5–8.1)

## 2022-05-18 LAB — CBC WITH DIFFERENTIAL (CANCER CENTER ONLY)
Abs Immature Granulocytes: 0.02 10*3/uL (ref 0.00–0.07)
Basophils Absolute: 0 10*3/uL (ref 0.0–0.1)
Basophils Relative: 0 %
Eosinophils Absolute: 0 10*3/uL (ref 0.0–0.5)
Eosinophils Relative: 1 %
HCT: 32.3 % — ABNORMAL LOW (ref 39.0–52.0)
Hemoglobin: 10.2 g/dL — ABNORMAL LOW (ref 13.0–17.0)
Immature Granulocytes: 1 %
Lymphocytes Relative: 33 %
Lymphs Abs: 1.1 10*3/uL (ref 0.7–4.0)
MCH: 25.6 pg — ABNORMAL LOW (ref 26.0–34.0)
MCHC: 31.6 g/dL (ref 30.0–36.0)
MCV: 81 fL (ref 80.0–100.0)
Monocytes Absolute: 0.6 10*3/uL (ref 0.1–1.0)
Monocytes Relative: 18 %
Neutro Abs: 1.5 10*3/uL — ABNORMAL LOW (ref 1.7–7.7)
Neutrophils Relative %: 47 %
Platelet Count: 35 10*3/uL — ABNORMAL LOW (ref 150–400)
RBC: 3.99 MIL/uL — ABNORMAL LOW (ref 4.22–5.81)
RDW: 16.7 % — ABNORMAL HIGH (ref 11.5–15.5)
WBC Count: 3.2 10*3/uL — ABNORMAL LOW (ref 4.0–10.5)
nRBC: 0 % (ref 0.0–0.2)

## 2022-05-18 MED ORDER — EPOETIN ALFA-EPBX 20000 UNIT/ML IJ SOLN
20000.0000 [IU] | Freq: Once | INTRAMUSCULAR | Status: AC
  Administered 2022-05-18: 20000 [IU] via SUBCUTANEOUS
  Filled 2022-05-18: qty 1

## 2022-05-18 NOTE — Progress Notes (Signed)
[place] [phone]   [phone]  PROGRESS NOTE  Patient Care Team: Plotnikov, Evie Lacks, MD as PCP - General Elouise Munroe, MD as PCP - Cardiology (Cardiology) Carol Ada, MD as Consulting Physician (Gastroenterology) Martinique, Amy, MD as Consulting Physician (Dermatology) Alda Berthold, DO as Consulting Physician (Neurology) [name], MD as Consulting Physician (Physical Medicine and Rehabilitation) [name], MD as Consulting Physician (Orthopedic Surgery) Szabat, Darnelle Maffucci, [facility] (Inactive) as Pharmacist (Pharmacist) Orson Slick, MD as Consulting Physician (Hematology and Oncology)  Hematological/Oncological History # Pancytopenia #Myelodysplastic Syndrome, Single Lineage Dysplasia 11/18/2021: WBC 2.4, ANC 1.1, Hgb 9.7, MCV 84.0, Plt 58. 12/07/2021: Establish care with Dr. Lorenso Courier and Dede Query 12/19/2021: Bone marrow biopsy which showed a low-grade myelodysplastic syndrome.  12/28/2021: started epoetin alpha shots 20,000 units q 2 weeks.   Interval History:  Justin [name]. 81 y.o. male with medical history significant for newly diagnosed low-grade myelodysplastic syndrome who presents for a follow up visit. The patient's last visit was on 03/22/2022.  Since that time he has continued erythropoietin shots.  On exam today Justin [name] reports that he has extensive arm bruising.  He does feel tired and reports his energy is currently a 6 or 7 out of 10.  He notes that he is able to do the chores at the house but he has to sit down frequently in order to rest.  He notes that he is not having any side effects as result of the shots though is having some minor bruising on his abdomen.  He is not having any itching or rashing.  He reports that he is not having any trouble with shortness of breath, lightheadedness, or dizziness.  He is delighted to have celebrated his 81rd birthday last week.  He reports that he continues to  have lesions removed from his scalp due to concern for skin cancers.  His appetite is been so-so.  He is not having any other overt signs of bleeding such as gum bleeding, dark stools, or blood in the urine.  He denies any fevers, chills, sweats, nausea vomiting or diarrhea.  A full 10 point ROS is listed below.  At this time he is willing and able to proceed with erythropoietin shots.   MEDICAL HISTORY:  Past Medical History:  Diagnosis Date   Alcoholism ([place])    Dry 13 years   Basal cell carcinoma    Right Chest   BPH (benign prostatic hyperplasia)    Diverticulosis of colon    Dizzy spells    none recent   DJD (degenerative joint disease)    lower back   Elbow fracture, left 2009   [name]   GERD (gastroesophageal reflux disease)    Glaucoma    History of TIAs 1 yrs ago   Hyperkeratosis    LBP (low back pain)    Macular degeneration    left wet right dry sees [name]   Melanoma [facility]) 2009   x3 [name]   Osteoarthritis    Restless leg syndrome    Thrombocytopenia ([place]) 11/18/2021   Tinnitus    Tobacco abuse    Uses walker    Wears glasses    reading   Wears partial dentures    upper    SURGICAL HISTORY: Past Surgical History:  Procedure Laterality Date   CATARACT EXTRACTION Bilateral 2018   HARDWARE REMOVAL Left 07/09/2020   Procedure: Left elbow hardware removal;  Surgeon: Victorino December  Saralyn Pilar, MD;  Location: [facility];  Service: Orthopedics;  Laterality: Left;  60 mins   mda     Dr. [name] wet injection   MELANOMA EXCISION  2009   x3   RECONSTRUCTION MEDIAL COLLATERAL LIGAMENT ELBOW W/ TENDON GRAFT  2009   Left, [name]   TONSILLECTOMY  as child   TOTAL KNEE ARTHROPLASTY Right 11/09/2020   Procedure: TOTAL KNEE ARTHROPLASTY;  Surgeon: [name], MD;  Location: [facility];  Service: Orthopedics;  Laterality: Right;    SOCIAL HISTORY: Social History   Socioeconomic History   Marital status: Married    Spouse name: Not on file    Number of children: 2   Years of education: Not on file   Highest education level: Not on file  Occupational History   Occupation: [place] Veteran - [employer]    Employer: RETIRED   Occupation: Chiropodist   Occupation: Heritage manager  Tobacco Use   Smoking status: Former    Packs/day: 3.00    Years: 38.00    Total pack years: 114.00    Types: Cigarettes    Quit date: 1986    Years since quitting: 37.8   Smokeless tobacco: Never   Tobacco comments:    states he quit May 15 1985  Vaping Use   Vaping Use: Never used  Substance and Sexual Activity   Alcohol use: No    Comment: PREVIOUS - DRY 67yr   Drug use: Not Currently   Sexual activity: Not Currently  Other Topics Concern   Not on file  Social History Narrative   Right handed   One story home   Drinks coffee q am   Social Determinants of Health   Financial Resource Strain: Low Risk  (07/25/2021)   Overall Financial Resource Strain (CARDIA)    Difficulty of Paying Living Expenses: Not hard at all  Food Insecurity: No Food Insecurity (07/25/2021)   Hunger Vital Sign    Worried About Running Out of Food in the Last Year: Never true    RCampobelloin the Last Year: Never true  Transportation Needs: No Transportation Needs (07/25/2021)   PRAPARE - THydrologist(Medical): No    Lack of Transportation (Non-Medical): No  Physical Activity: Insufficiently Active (07/25/2021)   Exercise Vital Sign    Days of Exercise per Week: 3 days    Minutes of Exercise per Session: 30 min  Stress: No Stress Concern Present (07/25/2021)   FVictor   Feeling of Stress : Not at all  Social Connections: Moderately Isolated (07/25/2021)   Social Connection and Isolation Panel [NHANES]    Frequency of Communication with Friends and Family: Twice a week    Frequency of Social Gatherings with Friends and Family: Twice a week     Attends Religious Services: Never    AMarine scientistor Organizations: No    Attends CArchivistMeetings: Never    Marital Status: Married  IHuman resources officerViolence: Not At Risk (07/25/2021)   Humiliation, Afraid, Rape, and Kick questionnaire    Fear of Current or Ex-Partner: No    Emotionally Abused: No    Physically Abused: No    Sexually Abused: No    FAMILY HISTORY: Family History  Problem Relation Age of Onset   Cancer Mother        ?breast   Cancer Father  Lung    ALLERGIES:  is allergic to levofloxacin.  MEDICATIONS:  Current Outpatient Medications  Medication Sig Dispense Refill   Acetaminophen (TYLENOL PO) Tylenol     amLODipine (NORVASC) 5 MG tablet Take 1 tablet (5 mg total) by mouth daily. 90 tablet 3   calcium carbonate (TUMS EX) 750 MG chewable tablet Chew 1 tablet by mouth daily.     carbidopa-levodopa (SINEMET IR) 25-100 MG tablet For breakthrough restless leg, you can take 1 tablet at bedtime.  OK to take extra tab as needed during the day. (Patient taking differently: Take 1 tablet by mouth at bedtime.) 100 tablet 3   Cholecalciferol 1000 UNITS tablet Take 3,000 Units by mouth daily.     Cyanocobalamin (VITAMIN B-12) 1000 MCG SUBL DISSOLVE 2 TABLETS IN MOUTH EVERY DAY 180 tablet 1   ferrous sulfate 325 (65 FE) MG tablet Take 650 mg by mouth daily with breakfast.     finasteride (PROSCAR) 5 MG tablet Take 1 tablet (5 mg total) by mouth daily. 90 tablet 1   gabapentin (NEURONTIN) 300 MG capsule TAKE 1 CAPSULE BY MOUTH FOUR TIMES A DAY (FOLLOW UP DUE IN AUG) 360 capsule 0   magnesium hydroxide (MILK OF MAGNESIA) 400 MG/5ML suspension Take 15 mLs by mouth daily as needed for mild constipation.     meclizine (ANTIVERT) 12.5 MG tablet TAKE 1 TABLET BY MOUTH 3 TIMES DAILY AS NEEDED FOR DIZZINESS. 60 tablet 2   Multiple Vitamins-Minerals (PRESERVISION AREDS PO) Take 2 tablets by mouth 2 (two) times daily.     Oxycodone HCl 10 MG TABS Take 10 mg  by mouth 5 (five) times daily as needed (pain). Typically only uses 4 times daily     Polyethyl Glycol-Propyl Glycol (SYSTANE OP) Place 1 drop into both eyes 2 (two) times daily as needed (dry eyes). [facility] O[facility]     pravastatin (PRAVACHOL) 20 MG tablet Take 1 tablet (20 mg total) by mouth daily. 90 tablet 1   rOPINIRole (REQUIP) 2 MG tablet TAKE 1 TABLET BY MOUTH AT 12 PM 1 TABLET AT 4 PM AND 1 TABLETS AT BEDTIME 360 tablet 1   tamsulosin (FLOMAX) 0.4 MG CAPS capsule Take 1 capsule (0.4 mg total) by mouth in the morning. 90 capsule 1   terazosin (HYTRIN) 2 MG capsule TAKE 1 CAPSULE BY MOUTH EVERYDAY AT BEDTIME 90 capsule 1   No current facility-administered medications for this visit.    REVIEW OF SYSTEMS:   Constitutional: ( - ) fevers, ( - )  chills , ( - ) night sweats Eyes: ( - ) blurriness of vision, ( - ) double vision, ( - ) watery eyes Ears, nose, mouth, throat, and face: ( - ) mucositis, ( - ) sore throat Respiratory: ( - ) cough, ( - ) dyspnea, ( - ) wheezes Cardiovascular: ( - ) palpitation, ( - ) chest discomfort, ( - ) lower extremity swelling Gastrointestinal:  ( - ) nausea, ( - ) heartburn, ( - ) change in bowel habits Skin: ( - ) abnormal skin rashes Lymphatics: ( - ) new lymphadenopathy, ( - ) easy bruising Neurological: ( - ) numbness, ( - ) tingling, ( - ) new weaknesses Behavioral/Psych: ( - ) mood change, ( - ) new changes  All other systems were reviewed with the patient and are negative.  PHYSICAL EXAMINATION:  Vitals:   05/18/22 1036  BP: (!) 150/71  Pulse: 62  Resp: 14  Temp: (!) 97.3 F (36.3 C)  SpO2:  100%   Filed Weights   05/18/22 1036  Weight: 172 lb 14.4 oz (78.4 kg)    GENERAL: Well-appearing elderly Caucasian male, alert, no distress and comfortable SKIN: skin color, texture, turgor are normal, no rashes or significant lesions EYES: conjunctiva are pink and non-injected, sclera clear LUNGS: clear to auscultation and percussion with normal  breathing effort HEART: regular rate & rhythm and no murmurs and no lower extremity edema Musculoskeletal: no cyanosis of digits and no clubbing  PSYCH: alert & oriented x 3, fluent speech NEURO: no focal motor/sensory deficits  LABORATORY DATA:  I have reviewed the data as listed    Latest Ref Rng & Units 05/18/2022    9:48 AM 05/03/2022   11:03 AM 04/19/2022   10:38 AM  CBC  WBC 4.0 - 10.5 K/uL 3.2  5.0  3.0   Hemoglobin 13.0 - 17.0 g/dL 10.2  10.2  10.4   Hematocrit 39.0 - 52.0 % 32.3  31.4  32.3   Platelets 150 - 400 K/uL 35  60  39        Latest Ref Rng & Units 05/18/2022    9:48 AM 05/03/2022   11:03 AM 04/19/2022   10:38 AM  CMP  Glucose 70 - 99 mg/dL 140  107  105   BUN 8 - 23 mg/dL _0 Creatinine 0.61 - 1.24 mg/dL 1.26  1.06  1.03   Sodium 135 - 145 mmol/L 140  139  139   Potassium 3.5 - 5.1 mmol/L 4.1  3.7  4.2   Chloride 98 - 111 mmol/L 106  107  107   CO2 22 - 32 mmol/L _1 Calcium 8.9 - 10.3 mg/dL 9.3  8.8  9.0   Total Protein 6.5 - 8.1 g/dL 7.4  7.3  6.9   Total Bilirubin 0.3 - 1.2 mg/dL 0.8  0.7  0.8   Alkaline Phos 38 - 126 U/L 60  63  62   AST 15 - 41 U/L _2 ALT 0 - 44 U/L <_3 Lab Results  Component Value Date   MPROTEIN Not Observed 12/07/2021   Lab Results  Component Value Date   KPAFRELGTCHN 45.3 (H) 12/07/2021   LAMBDASER 28.9 (H) 12/07/2021   KAPLAMBRATIO 1.57 12/07/2021   RADIOGRAPHIC STUDIES: Intravitreal Injection, Pharmacologic Agent - OS - Left Eye  Result Date: 04/24/2022 Time Out 04/24/2022. 8:41 AM. Confirmed correct patient, procedure, site, and patient consented. Anesthesia Topical anesthesia was used. Anesthetic medications included Lidocaine 2%, Proparacaine 0.5%. Procedure Preparation included 5% betadine to ocular surface, eyelid speculum. A (32g) needle was used. Injection: 2 mg aflibercept 2 MG/0.05ML   Route: Intravitreal, Site: Left Eye   NDC: [id number], Lot: [id number], Expiration  date: 08/03/2023, Waste: 0 mL Post-op Post injection exam found visual acuity of at least counting fingers. The patient tolerated the procedure well. There were no complications. The patient received written and verbal post procedure care education.   OCT, Retina - OU - Both Eyes  Result Date: 04/24/2022 Right Eye Quality was good. Central Foveal Thickness: 282. Progression has no prior data. Findings include normal foveal contour, no IRF, no SRF, retinal drusen , subretinal hyper-reflective material, pigment epithelial detachment [facility] / vitelliform like lesion). Left Eye Central Foveal Thickness: 227. Progression has no prior data. Findings include abnormal foveal contour, subretinal hyper-reflective material, intraretinal fluid, pigment epithelial detachment, subretinal  fluid (Shallow PED / CNVM w/ shallow SRF nasal mac). Notes *Images captured and stored on drive Diagnosis / Impression: OD: non-exu ARMD -- Central SRHM / vitelliform like lesion OS: exu ARMD -- Shallow PED / CNVM w/ shallow SRF nasal mac Clinical management: See below Abbreviations: NFP - Normal foveal profile. CME - cystoid macular edema. PED - pigment epithelial detachment. IRF - intraretinal fluid. SRF - subretinal fluid. EZ - ellipsoid zone. ERM - epiretinal membrane. ORA - outer retinal atrophy. ORT - outer retinal tubulation. SRHM - subretinal hyper-reflective material. IRHM - intraretinal hyper-reflective material    ASSESSMENT & Scott. 81 y.o. male with medical history significant for newly diagnosed low-grade myelodysplastic syndrome who presents for a follow up visit.  After review the labs, review the records, discussion with the patient the findings are most consistent with myelodysplastic syndrome.  This is confirmed with bone marrow biopsy.  Given that his major issue is a drop in the hemoglobin would recommend pursuing erythropoietin therapy.  Also discussed other options including  hypomethylating's as well as Luspatercept.  Given that he is not currently transfusion dependent I think we can begin with erythropoietin and consider the other options if his counts were to worsen.  Additionally research has shown that erythropoietin may actually boost his platelets and white blood cell count in the process.  The patient voiced understanding of the plan moving forward.  R-IPSS: Low   # Myelodysplastic Syndrome, Single Lineage Dysplasia -- At this time Mr. Dugar has a low-grade myelodysplasia that is anemia predominant. --Given that his hemoglobin was consistently less than 10 I recommended we proceed with erythropoietin therapy. --Discussed with the patient other options including hypomethylating therapies and Luspatercept, however given that anemia is his primary issue would recommend erythropoietin to see if this helps to elevate his other blood counts as well. --No clear signs of nutritional deficiency during our prior evaluation. - MDS FISH study showed no 5q-. Normal molecular studies.  Plan:  --labs today show white blood cell count 3.2, hemoglobin 10.2, MCV 81, and platelets of 35 --Offered to drop down to once monthly shots given his steadily improved hemoglobin.  He notes he would like to continue every 2 weeks. --RTC in 12 weeks time to assure he is tolerating the shots with continued q 2 week shots.   No orders of the defined types were placed in this encounter.   All questions were answered. The patient knows to call the clinic with any problems, questions or concerns.  A total of more than 30 minutes were spent on this encounter with face-to-face time and non-face-to-face time, including preparing to see the patient, ordering tests and/or medications, counseling the patient and coordination of care as outlined above.   Ledell Peoples, MD Department of Hematology/Oncology [place] at [facility] Phone: [phone] Pager:  [phone] Email: [email or web address]  05/18/2022 5:03 PM

## 2022-05-19 ENCOUNTER — Telehealth: Payer: Self-pay | Admitting: Hematology and Oncology

## 2022-05-19 NOTE — Telephone Encounter (Signed)
Called patient per 11/16 los. Patient notified of upcoming appointments.

## 2022-05-23 ENCOUNTER — Encounter: Payer: Self-pay | Admitting: Internal Medicine

## 2022-05-23 ENCOUNTER — Ambulatory Visit (INDEPENDENT_AMBULATORY_CARE_PROVIDER_SITE_OTHER): Payer: PPO | Admitting: Internal Medicine

## 2022-05-23 VITALS — BP 152/72 | HR 65 | Temp 98.4°F | Ht 68.5 in | Wt 176.0 lb

## 2022-05-23 DIAGNOSIS — G8929 Other chronic pain: Secondary | ICD-10-CM

## 2022-05-23 DIAGNOSIS — I10 Essential (primary) hypertension: Secondary | ICD-10-CM

## 2022-05-23 DIAGNOSIS — M545 Low back pain, unspecified: Secondary | ICD-10-CM

## 2022-05-23 DIAGNOSIS — Z23 Encounter for immunization: Secondary | ICD-10-CM

## 2022-05-23 DIAGNOSIS — I2510 Atherosclerotic heart disease of native coronary artery without angina pectoris: Secondary | ICD-10-CM | POA: Diagnosis not present

## 2022-05-23 DIAGNOSIS — D469 Myelodysplastic syndrome, unspecified: Secondary | ICD-10-CM | POA: Diagnosis not present

## 2022-05-23 DIAGNOSIS — R634 Abnormal weight loss: Secondary | ICD-10-CM | POA: Diagnosis not present

## 2022-05-23 NOTE — Assessment & Plan Note (Signed)
No angina 

## 2022-05-23 NOTE — Addendum Note (Signed)
Addended by: Earnstine Regal on: 05/23/2022 11:35 AM   Modules accepted: Orders

## 2022-05-23 NOTE — Assessment & Plan Note (Addendum)
Per Dr Nelva Bush Cont w/Oxy

## 2022-05-23 NOTE — Progress Notes (Signed)
Subjective:  Patient ID: Justin [name]., male    DOB: January 31, 1941  Age: 81 y.o. MRN: [id number]  CC: Follow-up (3 month f/u)   HPI [place] [employer]. presents for MDS, LBP, HTN, wt loss  Outpatient Medications Prior to Visit  Medication Sig Dispense Refill   Acetaminophen (TYLENOL PO) Tylenol     amLODipine (NORVASC) 5 MG tablet Take 1 tablet (5 mg total) by mouth daily. 90 tablet 3   calcium carbonate (TUMS EX) 750 MG chewable tablet Chew 1 tablet by mouth daily.     carbidopa-levodopa (SINEMET IR) 25-100 MG tablet For breakthrough restless leg, you can take 1 tablet at bedtime.  OK to take extra tab as needed during the day. (Patient taking differently: Take 1 tablet by mouth at bedtime.) 100 tablet 3   Cholecalciferol 1000 UNITS tablet Take 3,000 Units by mouth daily.     Cyanocobalamin (VITAMIN B-12) 1000 MCG SUBL DISSOLVE 2 TABLETS IN MOUTH EVERY DAY 180 tablet 1   ferrous sulfate 325 (65 FE) MG tablet Take 650 mg by mouth daily with breakfast.     finasteride (PROSCAR) 5 MG tablet Take 1 tablet (5 mg total) by mouth daily. 90 tablet 1   gabapentin (NEURONTIN) 300 MG capsule TAKE 1 CAPSULE BY MOUTH FOUR TIMES A DAY (FOLLOW UP DUE IN AUG) 360 capsule 0   magnesium hydroxide (MILK OF MAGNESIA) 400 MG/5ML suspension Take 15 mLs by mouth daily as needed for mild constipation.     meclizine (ANTIVERT) 12.5 MG tablet TAKE 1 TABLET BY MOUTH 3 TIMES DAILY AS NEEDED FOR DIZZINESS. 60 tablet 2   Multiple Vitamins-Minerals (PRESERVISION AREDS PO) Take 2 tablets by mouth 2 (two) times daily.     Oxycodone HCl 10 MG TABS Take 10 mg by mouth 5 (five) times daily as needed (pain). Typically only uses 4 times daily     Polyethyl Glycol-Propyl Glycol (SYSTANE OP) Place 1 drop into both eyes 2 (two) times daily as needed (dry eyes). [facility] O[facility]     pravastatin (PRAVACHOL) 20 MG tablet Take 1 tablet (20 mg total) by mouth daily. 90 tablet 1   rOPINIRole (REQUIP) 2 MG tablet TAKE 1 TABLET BY  MOUTH AT 12 PM 1 TABLET AT 4 PM AND 1 TABLETS AT BEDTIME 360 tablet 1   tamsulosin (FLOMAX) 0.4 MG CAPS capsule Take 1 capsule (0.4 mg total) by mouth in the morning. 90 capsule 1   terazosin (HYTRIN) 2 MG capsule TAKE 1 CAPSULE BY MOUTH EVERYDAY AT BEDTIME 90 capsule 1   No facility-administered medications prior to visit.    ROS: Review of Systems  Constitutional:  Negative for appetite change, fatigue and unexpected weight change.  HENT:  Negative for congestion, nosebleeds, sneezing, sore throat and trouble swallowing.   Eyes:  Negative for itching and visual disturbance.  Respiratory:  Negative for cough.   Cardiovascular:  Negative for chest pain, palpitations and leg swelling.  Gastrointestinal:  Negative for abdominal distention, blood in stool, diarrhea and nausea.  Genitourinary:  Negative for frequency and hematuria.  Musculoskeletal:  Positive for arthralgias, back pain and gait problem. Negative for joint swelling and neck pain.  Skin:  Positive for color change and wound. Negative for rash.  Neurological:  Negative for dizziness, tremors, speech difficulty and weakness.  Psychiatric/Behavioral:  Negative for agitation, dysphoric mood, sleep disturbance and suicidal ideas. The patient is not nervous/anxious.     Objective:  BP (!) 152/72 (BP Location: Right Leg)  Pulse 65   Temp 98.4 F (36.9 C) (Oral)   Ht 5' 8.5" (1.74 m)   Wt 176 lb (79.8 kg)   SpO2 96%   BMI 26.37 kg/m   BP Readings from Last 3 Encounters:  05/23/22 (!) 152/72  05/18/22 (!) 150/71  05/03/22 (!) 147/73    Wt Readings from Last 3 Encounters:  05/23/22 176 lb (79.8 kg)  05/18/22 172 lb 14.4 oz (78.4 kg)  05/02/22 178 lb (80.7 kg)    Physical Exam Constitutional:      General: He is not in acute distress.    Appearance: He is well-developed. He is obese.     Comments: NAD  Eyes:     Conjunctiva/sclera: Conjunctivae normal.     Pupils: Pupils are equal, round, and reactive to light.   Neck:     Thyroid: No thyromegaly.     Vascular: No JVD.  Cardiovascular:     Rate and Rhythm: Normal rate and regular rhythm.     Heart sounds: Normal heart sounds. No murmur heard.    No friction rub. No gallop.  Pulmonary:     Effort: Pulmonary effort is normal. No respiratory distress.     Breath sounds: Normal breath sounds. No wheezing or rales.  Chest:     Chest wall: No tenderness.  Abdominal:     General: Bowel sounds are normal. There is no distension.     Palpations: Abdomen is soft. There is no mass.     Tenderness: There is no abdominal tenderness. There is no guarding or rebound.  Musculoskeletal:        General: Tenderness present. Normal range of motion.     Cervical back: Normal range of motion.  Lymphadenopathy:     Cervical: No cervical adenopathy.  Skin:    General: Skin is warm and dry.     Findings: No rash.  Neurological:     Mental Status: He is alert and oriented to person, place, and time.     Cranial Nerves: No cranial nerve deficit.     Motor: No abnormal muscle tone.     Coordination: Coordination normal.     Gait: Gait normal.     Deep Tendon Reflexes: Reflexes are normal and symmetric.  Psychiatric:        Behavior: Behavior normal.        Thought Content: Thought content normal.        Judgment: Judgment normal.   Wounds on scalp Antalgic gait bruising  Lab Results  Component Value Date   WBC 3.2 (L) 05/18/2022   HGB 10.2 (L) 05/18/2022   HCT 32.3 (L) 05/18/2022   PLT 35 (L) 05/18/2022   GLUCOSE 140 (H) 05/18/2022   CHOL 136 01/12/2017   TRIG 67.0 01/12/2017   HDL 54.70 01/12/2017   LDLCALC 68 01/12/2017   ALT <5 05/18/2022   AST 20 05/18/2022   NA 140 05/18/2022   K 4.1 05/18/2022   CL 106 05/18/2022   CREATININE 1.26 (H) 05/18/2022   BUN 27 (H) 05/18/2022   CO2 27 05/18/2022   TSH 3.05 10/16/2019   PSA 0.47 08/30/2016   INR 1.1 10/28/2020   HGBA1C 5.3 04/19/2021    No results found.  Assessment & Plan:   Problem  List Items Addressed This Visit     Low back pain - Primary    Per Dr Nelva Bush Cont w/Oxy      HTN (hypertension)    On Hytrin, Amlodipine  Coronary artery disease    No angina       Weight loss    Wt Readings from Last 3 Encounters:  05/23/22 176 lb (79.8 kg)  05/18/22 172 lb 14.4 oz (78.4 kg)  05/02/22 178 lb (80.7 kg)  Resolved       MDS (myelodysplastic syndrome) (HCC)    F/u w/Dr Lorenso Courier Labs q 2 weeks         No orders of the defined types were placed in this encounter.     Follow-up: Return in about 6 months (around 11/21/2022) for a follow-up visit.  [name], MD

## 2022-05-23 NOTE — Assessment & Plan Note (Signed)
On Hytrin, Amlodipine

## 2022-05-23 NOTE — Assessment & Plan Note (Signed)
F/u w/Dr Lorenso Courier Labs q 2 weeks

## 2022-05-23 NOTE — Assessment & Plan Note (Signed)
Wt Readings from Last 3 Encounters:  05/23/22 176 lb (79.8 kg)  05/18/22 172 lb 14.4 oz (78.4 kg)  05/02/22 178 lb (80.7 kg)  Resolved

## 2022-05-30 DIAGNOSIS — Z85828 Personal history of other malignant neoplasm of skin: Secondary | ICD-10-CM | POA: Diagnosis not present

## 2022-05-30 DIAGNOSIS — C4442 Squamous cell carcinoma of skin of scalp and neck: Secondary | ICD-10-CM | POA: Diagnosis not present

## 2022-06-01 ENCOUNTER — Other Ambulatory Visit: Payer: Self-pay

## 2022-06-01 ENCOUNTER — Inpatient Hospital Stay: Payer: PPO

## 2022-06-01 VITALS — BP 154/82 | HR 75 | Temp 97.8°F | Resp 16 | Ht 68.5 in | Wt 173.2 lb

## 2022-06-01 DIAGNOSIS — D469 Myelodysplastic syndrome, unspecified: Secondary | ICD-10-CM | POA: Diagnosis not present

## 2022-06-01 LAB — CBC WITH DIFFERENTIAL (CANCER CENTER ONLY)
Abs Immature Granulocytes: 0.02 10*3/uL (ref 0.00–0.07)
Basophils Absolute: 0 10*3/uL (ref 0.0–0.1)
Basophils Relative: 0 %
Eosinophils Absolute: 0.1 10*3/uL (ref 0.0–0.5)
Eosinophils Relative: 1 %
HCT: 32.8 % — ABNORMAL LOW (ref 39.0–52.0)
Hemoglobin: 10.4 g/dL — ABNORMAL LOW (ref 13.0–17.0)
Immature Granulocytes: 0 %
Lymphocytes Relative: 16 %
Lymphs Abs: 1.1 10*3/uL (ref 0.7–4.0)
MCH: 25.9 pg — ABNORMAL LOW (ref 26.0–34.0)
MCHC: 31.7 g/dL (ref 30.0–36.0)
MCV: 81.8 fL (ref 80.0–100.0)
Monocytes Absolute: 1.4 10*3/uL — ABNORMAL HIGH (ref 0.1–1.0)
Monocytes Relative: 21 %
Neutro Abs: 4.2 10*3/uL (ref 1.7–7.7)
Neutrophils Relative %: 62 %
Platelet Count: 48 10*3/uL — ABNORMAL LOW (ref 150–400)
RBC: 4.01 MIL/uL — ABNORMAL LOW (ref 4.22–5.81)
RDW: 17.3 % — ABNORMAL HIGH (ref 11.5–15.5)
WBC Count: 6.8 10*3/uL (ref 4.0–10.5)
nRBC: 0 % (ref 0.0–0.2)

## 2022-06-01 LAB — CMP (CANCER CENTER ONLY)
ALT: 7 U/L (ref 0–44)
AST: 17 U/L (ref 15–41)
Albumin: 4.4 g/dL (ref 3.5–5.0)
Alkaline Phosphatase: 61 U/L (ref 38–126)
Anion gap: 7 (ref 5–15)
BUN: 21 mg/dL (ref 8–23)
CO2: 28 mmol/L (ref 22–32)
Calcium: 9.6 mg/dL (ref 8.9–10.3)
Chloride: 105 mmol/L (ref 98–111)
Creatinine: 1.11 mg/dL (ref 0.61–1.24)
GFR, Estimated: >60 mL/min (ref >60)
Glucose, Bld: 105 mg/dL — ABNORMAL HIGH (ref 70–99)
Potassium: 3.9 mmol/L (ref 3.5–5.1)
Sodium: 140 mmol/L (ref 135–145)
Total Bilirubin: 0.7 mg/dL (ref 0.3–1.2)
Total Protein: 7.5 g/dL (ref 6.5–8.1)

## 2022-06-01 MED ORDER — EPOETIN ALFA-EPBX 20000 UNIT/ML IJ SOLN
20000.0000 [IU] | Freq: Once | INTRAMUSCULAR | Status: AC
  Administered 2022-06-01: 20000 [IU] via SUBCUTANEOUS
  Filled 2022-06-01: qty 1

## 2022-06-06 ENCOUNTER — Ambulatory Visit: Payer: PPO | Admitting: Podiatry

## 2022-06-06 DIAGNOSIS — M79675 Pain in left toe(s): Secondary | ICD-10-CM | POA: Diagnosis not present

## 2022-06-06 DIAGNOSIS — B351 Tinea unguium: Secondary | ICD-10-CM

## 2022-06-06 DIAGNOSIS — Q828 Other specified congenital malformations of skin: Secondary | ICD-10-CM

## 2022-06-06 DIAGNOSIS — G629 Polyneuropathy, unspecified: Secondary | ICD-10-CM | POA: Diagnosis not present

## 2022-06-06 DIAGNOSIS — M79674 Pain in right toe(s): Secondary | ICD-10-CM | POA: Diagnosis not present

## 2022-06-07 NOTE — Progress Notes (Signed)
Subjective: 81 y.o. returns the office today for painful, elongated, thickened toenails which he cannot trim himself and also a painful corn second toe on the left foot, hammertoes. Denies any new open lesions no reports.  He continues with offloading for the hammertoe.  No fevers or chills.  No other concerns.   PCP: [name], MD Last seen 05/23/2022  Objective: AAO 3, NAD DP/PT pulses palpable, CRT less than 3 seconds Neurological status is unchanged.  Nails hypertrophic, dystrophic, elongated, brittle, discolored 10. There is tenderness overlying the nails 1-5 bilaterally. There is no surrounding erythema or drainage along the nail sites. Thick hyperkeratotic tissue present distal aspect left second toe without any underlying ulceration drainage or any signs of infection.   Hammertoes present with the left 3rd causing pain. He keeps a pad over the area that he makes which helps.  No pain with calf compression, swelling, warmth, erythema.  Assessment: Patient presents with symptomatic onychomycosis; hyperkeratotic lesion  Plan: -Treatment options including alternatives, risks, complications were discussed -Nails sharply debrided 10 without complication/bleeding. -Hyperkeratotic lesion sharply debrided x 1 without any complications.  Continue with offloading daily. Monitor for any skin breakdown.  -Neuropathy is stable, will continue to monitor  -Monitor for any clinical signs or symptoms of infection and directed to call the office immediately should any occur or go to the ER.  Trula Slade DPM

## 2022-06-15 ENCOUNTER — Inpatient Hospital Stay: Payer: PPO

## 2022-06-15 ENCOUNTER — Inpatient Hospital Stay: Payer: PPO | Attending: Physician Assistant

## 2022-06-15 VITALS — BP 158/68 | HR 70 | Temp 97.8°F | Resp 17

## 2022-06-15 DIAGNOSIS — D469 Myelodysplastic syndrome, unspecified: Secondary | ICD-10-CM

## 2022-06-15 LAB — CMP (CANCER CENTER ONLY)
ALT: <5 U/L (ref 0–44)
AST: 19 U/L (ref 15–41)
Albumin: 4.1 g/dL (ref 3.5–5.0)
Alkaline Phosphatase: 60 U/L (ref 38–126)
Anion gap: 5 (ref 5–15)
BUN: 20 mg/dL (ref 8–23)
CO2: 29 mmol/L (ref 22–32)
Calcium: 9.5 mg/dL (ref 8.9–10.3)
Chloride: 106 mmol/L (ref 98–111)
Creatinine: 1.06 mg/dL (ref 0.61–1.24)
GFR, Estimated: >60 mL/min (ref >60)
Glucose, Bld: 121 mg/dL — ABNORMAL HIGH (ref 70–99)
Potassium: 3.9 mmol/L (ref 3.5–5.1)
Sodium: 140 mmol/L (ref 135–145)
Total Bilirubin: 0.8 mg/dL (ref 0.3–1.2)
Total Protein: 6.9 g/dL (ref 6.5–8.1)

## 2022-06-15 LAB — CBC WITH DIFFERENTIAL (CANCER CENTER ONLY)
Abs Immature Granulocytes: 0.01 10*3/uL (ref 0.00–0.07)
Basophils Absolute: 0 10*3/uL (ref 0.0–0.1)
Basophils Relative: 1 %
Eosinophils Absolute: 0 10*3/uL (ref 0.0–0.5)
Eosinophils Relative: 2 %
HCT: 32.7 % — ABNORMAL LOW (ref 39.0–52.0)
Hemoglobin: 10.3 g/dL — ABNORMAL LOW (ref 13.0–17.0)
Immature Granulocytes: 0 %
Lymphocytes Relative: 34 %
Lymphs Abs: 0.9 10*3/uL (ref 0.7–4.0)
MCH: 25.9 pg — ABNORMAL LOW (ref 26.0–34.0)
MCHC: 31.5 g/dL (ref 30.0–36.0)
MCV: 82.4 fL (ref 80.0–100.0)
Monocytes Absolute: 0.8 10*3/uL (ref 0.1–1.0)
Monocytes Relative: 29 %
Neutro Abs: 0.9 10*3/uL — ABNORMAL LOW (ref 1.7–7.7)
Neutrophils Relative %: 34 %
Platelet Count: 44 10*3/uL — ABNORMAL LOW (ref 150–400)
RBC: 3.97 MIL/uL — ABNORMAL LOW (ref 4.22–5.81)
RDW: 17.3 % — ABNORMAL HIGH (ref 11.5–15.5)
WBC Count: 2.7 10*3/uL — ABNORMAL LOW (ref 4.0–10.5)
nRBC: 0 % (ref 0.0–0.2)

## 2022-06-15 MED ORDER — EPOETIN ALFA-EPBX 20000 UNIT/ML IJ SOLN
20000.0000 [IU] | Freq: Once | INTRAMUSCULAR | Status: AC
  Administered 2022-06-15: 20000 [IU] via SUBCUTANEOUS
  Filled 2022-06-15: qty 1

## 2022-06-15 NOTE — Progress Notes (Incomplete)
[facility] Note  06/19/2022     CHIEF COMPLAINT Patient presents for No chief complaint on file.   HISTORY OF PRESENT ILLNESS: Justin [name]. is a 81 y.o. male who presents to the clinic today for:    Pt is a previous Dr. [name] pt who received Eylea injections every 10 weeks, pts last injection was 08.16.23, pt states he has been seeing Dr. [name] for about 6 years, pt feels like vision in the left eye is stable, pt states he takes AREDS 2, he has not checked his vision on an [place] grid in years  Referring physician: [name], MD [place],  [place] [address]  HISTORICAL INFORMATION:   Selected notes from the MEDICAL RECORD NUMBER Previous Dr. [name] pt LEE:  Ocular Hx- PMH-    CURRENT MEDICATIONS: Current Outpatient Medications (Ophthalmic Drugs)  Medication Sig   Polyethyl Glycol-Propyl Glycol (SYSTANE OP) Place 1 drop into both eyes 2 (two) times daily as needed (dry eyes). [facility] O[facility]   No current facility-administered medications for this visit. (Ophthalmic Drugs)   Current Outpatient Medications (Other)  Medication Sig   Acetaminophen (TYLENOL PO) Tylenol   amLODipine (NORVASC) 5 MG tablet Take 1 tablet (5 mg total) by mouth daily.   calcium carbonate (TUMS EX) 750 MG chewable tablet Chew 1 tablet by mouth daily.   carbidopa-levodopa (SINEMET IR) 25-100 MG tablet For breakthrough restless leg, you can take 1 tablet at bedtime.  OK to take extra tab as needed during the day. (Patient taking differently: Take 1 tablet by mouth at bedtime.)   Cholecalciferol 1000 UNITS tablet Take 3,000 Units by mouth daily.   Cyanocobalamin (VITAMIN B-12) 1000 MCG SUBL DISSOLVE 2 TABLETS IN MOUTH EVERY DAY   ferrous sulfate 325 (65 FE) MG tablet Take 650 mg by mouth daily with breakfast.   finasteride (PROSCAR) 5 MG tablet Take 1 tablet (5 mg total) by mouth daily.   gabapentin (NEURONTIN) 300 MG capsule TAKE 1 CAPSULE BY  MOUTH FOUR TIMES A DAY (FOLLOW UP DUE IN AUG)   magnesium hydroxide (MILK OF MAGNESIA) 400 MG/5ML suspension Take 15 mLs by mouth daily as needed for mild constipation.   meclizine (ANTIVERT) 12.5 MG tablet TAKE 1 TABLET BY MOUTH 3 TIMES DAILY AS NEEDED FOR DIZZINESS.   Multiple Vitamins-Minerals (PRESERVISION AREDS PO) Take 2 tablets by mouth 2 (two) times daily.   Oxycodone HCl 10 MG TABS Take 10 mg by mouth 5 (five) times daily as needed (pain). Typically only uses 4 times daily   pravastatin (PRAVACHOL) 20 MG tablet Take 1 tablet (20 mg total) by mouth daily.   rOPINIRole (REQUIP) 2 MG tablet TAKE 1 TABLET BY MOUTH AT 12 PM 1 TABLET AT 4 PM AND 1 TABLETS AT BEDTIME   tamsulosin (FLOMAX) 0.4 MG CAPS capsule Take 1 capsule (0.4 mg total) by mouth in the morning.   terazosin (HYTRIN) 2 MG capsule TAKE 1 CAPSULE BY MOUTH EVERYDAY AT BEDTIME   No current facility-administered medications for this visit. (Other)   REVIEW OF SYSTEMS:   ALLERGIES Allergies  Allergen Reactions   Levofloxacin     REACTION: hands pealed   PAST MEDICAL HISTORY Past Medical History:  Diagnosis Date   Alcoholism ([place])    Dry 13 years   Basal cell carcinoma    Right Chest   BPH (benign prostatic hyperplasia)    Diverticulosis of colon    Dizzy spells  none recent   DJD (degenerative joint disease)    lower back   Elbow fracture, left 2009   [name]   GERD (gastroesophageal reflux disease)    Glaucoma    History of TIAs 1 yrs ago   Hyperkeratosis    LBP (low back pain)    Macular degeneration    left wet right dry sees [name]   Melanoma [facility]) 2009   x3 [name]   Osteoarthritis    Restless leg syndrome    Thrombocytopenia ([place]) 11/18/2021   Tinnitus    Tobacco abuse    Uses walker    Wears glasses    reading   Wears partial dentures    upper   Past Surgical History:  Procedure Laterality Date   CATARACT EXTRACTION Bilateral 2018   HARDWARE REMOVAL Left 07/09/2020   Procedure:  Left elbow hardware removal;  Surgeon: [name], MD;  Location: [facility];  Service: Orthopedics;  Laterality: Left;  60 mins   mda     Dr. [name] wet injection   MELANOMA EXCISION  2009   x3   RECONSTRUCTION MEDIAL COLLATERAL LIGAMENT ELBOW W/ TENDON GRAFT  2009   Left, [name]   TONSILLECTOMY  as child   TOTAL KNEE ARTHROPLASTY Right 11/09/2020   Procedure: TOTAL KNEE ARTHROPLASTY;  Surgeon: [name], MD;  Location: [facility];  Service: Orthopedics;  Laterality: Right;   FAMILY HISTORY Family History  Problem Relation Age of Onset   Cancer Mother        ?breast   Cancer Father        Lung   SOCIAL HISTORY Social History   Tobacco Use   Smoking status: Former    Packs/day: 3.00    Years: 38.00    Total pack years: 114.00    Types: Cigarettes    Quit date: 1986    Years since quitting: 37.9   Smokeless tobacco: Never   Tobacco comments:    states he quit May 15 1985  Vaping Use   Vaping Use: Never used  Substance Use Topics   Alcohol use: No    Comment: PREVIOUS - DRY 45yr   Drug use: Not Currently       OPHTHALMIC EXAM:  Not recorded    IMAGING AND PROCEDURES  Imaging and Procedures for 06/19/2022          ASSESSMENT/PLAN:  No diagnosis found.  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 RZadie Rhinedue to insurance  - per Epic chart review, pt has had IVE x16 OS with Dr. RZadie Rhinefrom 04.14.21-08.16.23  - currently on q10 wk schedule  - The incidence pathology and anatomy of wet AMD discussed   - discussed treatment options including observation vs intravitreal anti-VEGF agents such as Avastin, Lucentis, Eylea.    - Risks of endophthalmitis and vascular occlusive events and atrophic changes discussed with patient  - OCT OS shows Shallow PED / CNVM w/ shallow SRF nasal mac at 10 wks  - recommend IVE OS #18 today, 12.18.23 with follow up in 8 weeks - pt wishes  to be treated with IVE - RBA of procedure discussed, questions answered - informed consent obtained and signed - see procedure note  - f/u in 8 wks -- DFE/OCT, possible injection  2. Age related macular degeneration, non-exudative, right eye  - The incidence, anatomy, and pathology of dry AMD, risk of progression, and  the AREDS and AREDS 2 study including smoking risks discussed with patient.  - Recommend amsler grid monitoring  - f/u 3 months DFE, OCT  3,4. Hypertensive retinopathy OU - discussed importance of tight BP control - monitor  5. Pseudophakia OU  - s/p CE/IOL  - IOL in good position, doing well  - monitor  Ophthalmic Meds Ordered this visit:  No orders of the defined types were placed in this encounter.    No follow-ups on file.  There are no Patient Instructions on file for this visit.  Explained the diagnoses, plan, and follow up with the patient and they expressed understanding.  Patient expressed understanding of the importance of proper follow up care.   This document serves as a record of services personally performed by Gardiner Sleeper, MD, PhD. It was created on their behalf by Roselee Nova, COMT. The creation of this record is the provider's dictation and/or activities during the visit.  Electronically signed by: Roselee Nova, COMT 06/15/22 10:21 AM    Gardiner Sleeper, M.D., Ph.D. Diseases & Surgery of the Retina and Vitreous [facility]: M myopia (nearsighted); A astigmatism; H hyperopia (farsighted); P presbyopia; Mrx spectacle prescription;  CTL contact lenses; OD right eye; OS left eye; OU both eyes  XT exotropia; ET esotropia; PEK punctate epithelial keratitis; PEE punctate epithelial erosions; DES dry eye syndrome; MGD meibomian gland dysfunction; ATs artificial tears; PFAT's preservative free artificial tears; [place] nuclear sclerotic cataract; PSC posterior subcapsular cataract; ERM epi-retinal membrane; PVD  posterior vitreous detachment; RD retinal detachment; DM diabetes mellitus; DR diabetic retinopathy; NPDR non-proliferative diabetic retinopathy; PDR proliferative diabetic retinopathy; CSME clinically significant macular edema; DME diabetic macular edema; dbh dot blot hemorrhages; CWS cotton wool spot; POAG primary open angle glaucoma; C/D cup-to-disc ratio; HVF humphrey visual field; GVF goldmann visual field; OCT optical coherence tomography; IOP intraocular pressure; BRVO Branch retinal vein occlusion; CRVO central retinal vein occlusion; CRAO central retinal artery occlusion; BRAO branch retinal artery occlusion; RT retinal tear; SB scleral buckle; PPV pars plana vitrectomy; VH Vitreous hemorrhage; PRP panretinal laser photocoagulation; IVK intravitreal kenalog; VMT vitreomacular traction; MH Macular hole;  NVD neovascularization of the disc; NVE neovascularization elsewhere; AREDS age related eye disease study; ARMD age related macular degeneration; POAG primary open angle glaucoma; EBMD epithelial/anterior basement membrane dystrophy; ACIOL anterior chamber intraocular lens; IOL intraocular lens; PCIOL posterior chamber intraocular lens; Phaco/IOL phacoemulsification with intraocular lens placement; [place] photorefractive keratectomy; LASIK laser assisted in situ keratomileusis; HTN hypertension; DM diabetes mellitus; COPD chronic obstructive pulmonary disease

## 2022-06-19 ENCOUNTER — Encounter (INDEPENDENT_AMBULATORY_CARE_PROVIDER_SITE_OTHER): Payer: PPO | Admitting: Ophthalmology

## 2022-06-21 DIAGNOSIS — H353111 Nonexudative age-related macular degeneration, right eye, early dry stage: Secondary | ICD-10-CM | POA: Diagnosis not present

## 2022-06-21 DIAGNOSIS — H353221 Exudative age-related macular degeneration, left eye, with active choroidal neovascularization: Secondary | ICD-10-CM | POA: Diagnosis not present

## 2022-06-21 DIAGNOSIS — H43812 Vitreous degeneration, left eye: Secondary | ICD-10-CM | POA: Diagnosis not present

## 2022-06-29 ENCOUNTER — Inpatient Hospital Stay: Payer: PPO

## 2022-06-29 ENCOUNTER — Other Ambulatory Visit: Payer: Self-pay

## 2022-06-29 VITALS — BP 136/68 | HR 60 | Temp 98.0°F | Resp 20

## 2022-06-29 DIAGNOSIS — D469 Myelodysplastic syndrome, unspecified: Secondary | ICD-10-CM | POA: Diagnosis not present

## 2022-06-29 LAB — CBC WITH DIFFERENTIAL (CANCER CENTER ONLY)
Abs Immature Granulocytes: 0.01 10*3/uL (ref 0.00–0.07)
Basophils Absolute: 0 10*3/uL (ref 0.0–0.1)
Basophils Relative: 1 %
Eosinophils Absolute: 0.1 10*3/uL (ref 0.0–0.5)
Eosinophils Relative: 2 %
HCT: 32.2 % — ABNORMAL LOW (ref 39.0–52.0)
Hemoglobin: 10 g/dL — ABNORMAL LOW (ref 13.0–17.0)
Immature Granulocytes: 0 %
Lymphocytes Relative: 37 %
Lymphs Abs: 1.2 10*3/uL (ref 0.7–4.0)
MCH: 25.3 pg — ABNORMAL LOW (ref 26.0–34.0)
MCHC: 31.1 g/dL (ref 30.0–36.0)
MCV: 81.3 fL (ref 80.0–100.0)
Monocytes Absolute: 0.7 10*3/uL (ref 0.1–1.0)
Monocytes Relative: 20 %
Neutro Abs: 1.3 10*3/uL — ABNORMAL LOW (ref 1.7–7.7)
Neutrophils Relative %: 40 %
Platelet Count: 42 10*3/uL — ABNORMAL LOW (ref 150–400)
RBC: 3.96 MIL/uL — ABNORMAL LOW (ref 4.22–5.81)
RDW: 16.8 % — ABNORMAL HIGH (ref 11.5–15.5)
WBC Count: 3.2 10*3/uL — ABNORMAL LOW (ref 4.0–10.5)
nRBC: 0 % (ref 0.0–0.2)

## 2022-06-29 LAB — CMP (CANCER CENTER ONLY)
ALT: 5 U/L (ref 0–44)
AST: 20 U/L (ref 15–41)
Albumin: 4 g/dL (ref 3.5–5.0)
Alkaline Phosphatase: 52 U/L (ref 38–126)
Anion gap: 5 (ref 5–15)
BUN: 19 mg/dL (ref 8–23)
CO2: 29 mmol/L (ref 22–32)
Calcium: 9.2 mg/dL (ref 8.9–10.3)
Chloride: 106 mmol/L (ref 98–111)
Creatinine: 1.1 mg/dL (ref 0.61–1.24)
GFR, Estimated: >60 mL/min (ref >60)
Glucose, Bld: 117 mg/dL — ABNORMAL HIGH (ref 70–99)
Potassium: 4.2 mmol/L (ref 3.5–5.1)
Sodium: 140 mmol/L (ref 135–145)
Total Bilirubin: 0.7 mg/dL (ref 0.3–1.2)
Total Protein: 6.9 g/dL (ref 6.5–8.1)

## 2022-06-29 MED ORDER — EPOETIN ALFA-EPBX 20000 UNIT/ML IJ SOLN
20000.0000 [IU] | Freq: Once | INTRAMUSCULAR | Status: AC
  Administered 2022-06-29: 20000 [IU] via SUBCUTANEOUS
  Filled 2022-06-29: qty 1

## 2022-07-10 ENCOUNTER — Encounter: Payer: Self-pay | Admitting: Internal Medicine

## 2022-07-10 DIAGNOSIS — H524 Presbyopia: Secondary | ICD-10-CM | POA: Diagnosis not present

## 2022-07-10 DIAGNOSIS — H402222 Chronic angle-closure glaucoma, left eye, moderate stage: Secondary | ICD-10-CM | POA: Diagnosis not present

## 2022-07-10 DIAGNOSIS — H353113 Nonexudative age-related macular degeneration, right eye, advanced atrophic without subfoveal involvement: Secondary | ICD-10-CM | POA: Diagnosis not present

## 2022-07-10 DIAGNOSIS — H402211 Chronic angle-closure glaucoma, right eye, mild stage: Secondary | ICD-10-CM | POA: Diagnosis not present

## 2022-07-10 DIAGNOSIS — H35031 Hypertensive retinopathy, right eye: Secondary | ICD-10-CM | POA: Diagnosis not present

## 2022-07-10 DIAGNOSIS — H04123 Dry eye syndrome of bilateral lacrimal glands: Secondary | ICD-10-CM | POA: Diagnosis not present

## 2022-07-10 DIAGNOSIS — H40043 Steroid responder, bilateral: Secondary | ICD-10-CM | POA: Diagnosis not present

## 2022-07-10 LAB — HM DIABETES EYE EXAM

## 2022-07-13 ENCOUNTER — Inpatient Hospital Stay: Payer: PPO | Attending: Physician Assistant

## 2022-07-13 ENCOUNTER — Inpatient Hospital Stay: Payer: PPO

## 2022-07-13 VITALS — BP 154/68 | HR 60 | Temp 97.6°F | Resp 18

## 2022-07-13 DIAGNOSIS — D469 Myelodysplastic syndrome, unspecified: Secondary | ICD-10-CM

## 2022-07-13 LAB — CMP (CANCER CENTER ONLY)
ALT: 5 U/L (ref 0–44)
AST: 18 U/L (ref 15–41)
Albumin: 4.1 g/dL (ref 3.5–5.0)
Alkaline Phosphatase: 52 U/L (ref 38–126)
Anion gap: 6 (ref 5–15)
BUN: 22 mg/dL (ref 8–23)
CO2: 26 mmol/L (ref 22–32)
Calcium: 9.2 mg/dL (ref 8.9–10.3)
Chloride: 107 mmol/L (ref 98–111)
Creatinine: 1.23 mg/dL (ref 0.61–1.24)
GFR, Estimated: 58 mL/min — ABNORMAL LOW (ref >60)
Glucose, Bld: 98 mg/dL (ref 70–99)
Potassium: 3.8 mmol/L (ref 3.5–5.1)
Sodium: 139 mmol/L (ref 135–145)
Total Bilirubin: 0.6 mg/dL (ref 0.3–1.2)
Total Protein: 6.7 g/dL (ref 6.5–8.1)

## 2022-07-13 LAB — CBC WITH DIFFERENTIAL (CANCER CENTER ONLY)
Abs Immature Granulocytes: 0.01 10*3/uL (ref 0.00–0.07)
Basophils Absolute: 0 10*3/uL (ref 0.0–0.1)
Basophils Relative: 0 %
Eosinophils Absolute: 0 10*3/uL (ref 0.0–0.5)
Eosinophils Relative: 1 %
HCT: 31.1 % — ABNORMAL LOW (ref 39.0–52.0)
Hemoglobin: 10.3 g/dL — ABNORMAL LOW (ref 13.0–17.0)
Immature Granulocytes: 0 %
Lymphocytes Relative: 43 %
Lymphs Abs: 1.2 10*3/uL (ref 0.7–4.0)
MCH: 26.4 pg (ref 26.0–34.0)
MCHC: 33.1 g/dL (ref 30.0–36.0)
MCV: 79.7 fL — ABNORMAL LOW (ref 80.0–100.0)
Monocytes Absolute: 0.6 10*3/uL (ref 0.1–1.0)
Monocytes Relative: 20 %
Neutro Abs: 1 10*3/uL — ABNORMAL LOW (ref 1.7–7.7)
Neutrophils Relative %: 36 %
Platelet Count: 37 10*3/uL — ABNORMAL LOW (ref 150–400)
RBC: 3.9 MIL/uL — ABNORMAL LOW (ref 4.22–5.81)
RDW: 16.5 % — ABNORMAL HIGH (ref 11.5–15.5)
WBC Count: 2.8 10*3/uL — ABNORMAL LOW (ref 4.0–10.5)
nRBC: 0 % (ref 0.0–0.2)

## 2022-07-13 MED ORDER — EPOETIN ALFA-EPBX 20000 UNIT/ML IJ SOLN
20000.0000 [IU] | Freq: Once | INTRAMUSCULAR | Status: AC
  Administered 2022-07-13: 20000 [IU] via SUBCUTANEOUS
  Filled 2022-07-13: qty 1

## 2022-07-13 NOTE — Patient Instructions (Signed)

## 2022-07-19 ENCOUNTER — Encounter: Payer: Self-pay | Admitting: Internal Medicine

## 2022-07-19 ENCOUNTER — Ambulatory Visit (INDEPENDENT_AMBULATORY_CARE_PROVIDER_SITE_OTHER): Payer: PPO | Admitting: Internal Medicine

## 2022-07-19 VITALS — BP 100/60 | HR 50 | Temp 98.1°F | Ht 68.0 in | Wt 168.6 lb

## 2022-07-19 DIAGNOSIS — E559 Vitamin D deficiency, unspecified: Secondary | ICD-10-CM

## 2022-07-19 DIAGNOSIS — A059 Bacterial foodborne intoxication, unspecified: Secondary | ICD-10-CM | POA: Diagnosis not present

## 2022-07-19 DIAGNOSIS — E538 Deficiency of other specified B group vitamins: Secondary | ICD-10-CM | POA: Diagnosis not present

## 2022-07-19 NOTE — Progress Notes (Signed)
Subjective:  Patient ID: Justin Pagan Su Grand., male    DOB: May 07, 1941  Age: 82 y.o. MRN: [id number]  CC: Acute Visit (Monday night pt states feeling bad, nauseous, and fatigue )   HPI Justin [name]. presents for feeling bad on Monday night pt states feeling bad, nauseous, and fatigue at 9:30 pm. He had a frozen fish square for supper. He had heavy phlegm, gaging. Went to bed and felt ok next am.   Outpatient Medications Prior to Visit  Medication Sig Dispense Refill   Acetaminophen (TYLENOL PO) Tylenol     amLODipine (NORVASC) 5 MG tablet Take 1 tablet (5 mg total) by mouth daily. 90 tablet 3   calcium carbonate (TUMS EX) 750 MG chewable tablet Chew 1 tablet by mouth daily.     carbidopa-levodopa (SINEMET IR) 25-100 MG tablet For breakthrough restless leg, you can take 1 tablet at bedtime.  OK to take extra tab as needed during the day. (Patient taking differently: Take 1 tablet by mouth at bedtime.) 100 tablet 3   Cholecalciferol 1000 UNITS tablet Take 3,000 Units by mouth daily.     Cyanocobalamin (VITAMIN B-12) 1000 MCG SUBL DISSOLVE 2 TABLETS IN MOUTH EVERY DAY 180 tablet 1   ferrous sulfate 325 (65 FE) MG tablet Take 650 mg by mouth daily with breakfast.     finasteride (PROSCAR) 5 MG tablet Take 1 tablet (5 mg total) by mouth daily. 90 tablet 1   gabapentin (NEURONTIN) 300 MG capsule TAKE 1 CAPSULE BY MOUTH FOUR TIMES A DAY (FOLLOW UP DUE IN AUG) 360 capsule 0   magnesium hydroxide (MILK OF MAGNESIA) 400 MG/5ML suspension Take 15 mLs by mouth daily as needed for mild constipation.     meclizine (ANTIVERT) 12.5 MG tablet TAKE 1 TABLET BY MOUTH 3 TIMES DAILY AS NEEDED FOR DIZZINESS. 60 tablet 2   Multiple Vitamins-Minerals (PRESERVISION AREDS PO) Take 2 tablets by mouth 2 (two) times daily.     Oxycodone HCl 10 MG TABS Take 10 mg by mouth 5 (five) times daily as needed (pain). Typically only uses 4 times daily     Polyethyl Glycol-Propyl Glycol (SYSTANE OP) Place 1 drop into  both eyes 2 (two) times daily as needed (dry eyes). [facility] O[facility]     pravastatin (PRAVACHOL) 20 MG tablet Take 1 tablet (20 mg total) by mouth daily. 90 tablet 1   rOPINIRole (REQUIP) 2 MG tablet TAKE 1 TABLET BY MOUTH AT 12 PM 1 TABLET AT 4 PM AND 1 TABLETS AT BEDTIME 360 tablet 1   tamsulosin (FLOMAX) 0.4 MG CAPS capsule Take 1 capsule (0.4 mg total) by mouth in the morning. 90 capsule 1   terazosin (HYTRIN) 2 MG capsule TAKE 1 CAPSULE BY MOUTH EVERYDAY AT BEDTIME 90 capsule 1   timolol (TIMOPTIC) 0.5 % ophthalmic solution Place 1 drop into the left eye 2 (two) times daily.     No facility-administered medications prior to visit.    ROS: Review of Systems  Constitutional:  Negative for appetite change, fatigue and unexpected weight change.  HENT:  Negative for congestion, nosebleeds, sneezing, sore throat and trouble swallowing.   Eyes:  Negative for itching and visual disturbance.  Respiratory:  Negative for cough.   Cardiovascular:  Negative for chest pain, palpitations and leg swelling.  Gastrointestinal:  Negative for abdominal distention, blood in stool, diarrhea and nausea.  Genitourinary:  Negative for frequency and hematuria.  Musculoskeletal:  Positive for arthralgias and gait problem. Negative for back pain,  joint swelling and neck pain.  Skin:  Negative for rash.  Neurological:  Negative for dizziness, tremors, speech difficulty and weakness.  Psychiatric/Behavioral:  Negative for agitation, dysphoric mood, sleep disturbance and suicidal ideas. The patient is not nervous/anxious.     Objective:  BP 100/60 (BP Location: Right Arm, Patient Position: Sitting, Cuff Size: Large)   Pulse (!) 50   Temp 98.1 F (36.7 C) (Oral)   Ht '5\' 8"'$  (1.727 m)   Wt 168 lb 9.6 oz (76.5 kg)   SpO2 95%   BMI 25.64 kg/m   BP Readings from Last 3 Encounters:  07/19/22 100/60  07/13/22 (!) 154/68  06/29/22 136/68    Wt Readings from Last 3 Encounters:  07/19/22 168 lb 9.6 oz (76.5 kg)   06/01/22 173 lb 3.2 oz (78.6 kg)  05/23/22 176 lb (79.8 kg)    Physical Exam Constitutional:      General: He is not in acute distress.    Appearance: He is well-developed.     Comments: NAD  Eyes:     Conjunctiva/sclera: Conjunctivae normal.     Pupils: Pupils are equal, round, and reactive to light.  Neck:     Thyroid: No thyromegaly.     Vascular: No JVD.  Cardiovascular:     Rate and Rhythm: Normal rate and regular rhythm.     Heart sounds: Normal heart sounds. No murmur heard.    No friction rub. No gallop.  Pulmonary:     Effort: Pulmonary effort is normal. No respiratory distress.     Breath sounds: Normal breath sounds. No wheezing or rales.  Chest:     Chest wall: No tenderness.  Abdominal:     General: Bowel sounds are normal. There is no distension.     Palpations: Abdomen is soft. There is no mass.     Tenderness: There is no abdominal tenderness. There is no guarding or rebound.  Musculoskeletal:        General: No tenderness. Normal range of motion.     Cervical back: Normal range of motion.  Lymphadenopathy:     Cervical: No cervical adenopathy.  Skin:    General: Skin is warm and dry.     Findings: No rash.  Neurological:     Mental Status: He is alert and oriented to person, place, and time.     Cranial Nerves: No cranial nerve deficit.     Motor: Weakness present. No abnormal muscle tone.     Coordination: Coordination abnormal.     Gait: Gait abnormal.     Deep Tendon Reflexes: Reflexes are normal and symmetric.  Psychiatric:        Behavior: Behavior normal.        Thought Content: Thought content normal.        Judgment: Judgment normal.   Using a cane  Lab Results  Component Value Date   WBC 2.8 (L) 07/13/2022   HGB 10.3 (L) 07/13/2022   HCT 31.1 (L) 07/13/2022   PLT 37 (L) 07/13/2022   GLUCOSE 98 07/13/2022   CHOL 136 01/12/2017   TRIG 67.0 01/12/2017   HDL 54.70 01/12/2017   LDLCALC 68 01/12/2017   ALT 5 07/13/2022   AST 18  07/13/2022   NA 139 07/13/2022   K 3.8 07/13/2022   CL 107 07/13/2022   CREATININE 1.23 07/13/2022   BUN 22 07/13/2022   CO2 26 07/13/2022   TSH 3.05 10/16/2019   PSA 0.47 08/30/2016   INR 1.1 10/28/2020  HGBA1C 5.3 04/19/2021    No results found.  Assessment & Plan:   Problem List Items Addressed This Visit       Other   Vitamin D deficiency    On vit D      Vitamin B12 deficiency    On Vit B12      Food poisoning - Primary    Resolved No evidence of TIA, CVA etc Pt was feeling bad on Monday night pt states feeling bad, nauseous, and fatigue at 9:30 pm. He had a frozen fish square for supper. He had heavy phlegm, gaging. Went to bed and felt ok next am.          No orders of the defined types were placed in this encounter.     Follow-up: No follow-ups on file.  [name], MD

## 2022-07-19 NOTE — Assessment & Plan Note (Signed)
On vit D 

## 2022-07-19 NOTE — Assessment & Plan Note (Signed)
On Vit B12 

## 2022-07-19 NOTE — Assessment & Plan Note (Signed)
Resolved No evidence of TIA, CVA etc Pt was feeling bad on Monday night pt states feeling bad, nauseous, and fatigue at 9:30 pm. He had a frozen fish square for supper. He had heavy phlegm, gaging. Went to bed and felt ok next am.

## 2022-07-20 ENCOUNTER — Other Ambulatory Visit: Payer: Self-pay | Admitting: Internal Medicine

## 2022-07-27 ENCOUNTER — Inpatient Hospital Stay: Payer: PPO

## 2022-07-27 ENCOUNTER — Other Ambulatory Visit: Payer: Self-pay

## 2022-07-27 VITALS — BP 158/75 | HR 66 | Temp 98.0°F | Resp 17

## 2022-07-27 DIAGNOSIS — D469 Myelodysplastic syndrome, unspecified: Secondary | ICD-10-CM | POA: Diagnosis not present

## 2022-07-27 LAB — CMP (CANCER CENTER ONLY)
ALT: 5 U/L (ref 0–44)
AST: 18 U/L (ref 15–41)
Albumin: 4 g/dL (ref 3.5–5.0)
Alkaline Phosphatase: 53 U/L (ref 38–126)
Anion gap: 7 (ref 5–15)
BUN: 23 mg/dL (ref 8–23)
CO2: 26 mmol/L (ref 22–32)
Calcium: 9.5 mg/dL (ref 8.9–10.3)
Chloride: 105 mmol/L (ref 98–111)
Creatinine: 1.16 mg/dL (ref 0.61–1.24)
GFR, Estimated: >60 mL/min (ref >60)
Glucose, Bld: 104 mg/dL — ABNORMAL HIGH (ref 70–99)
Potassium: 3.9 mmol/L (ref 3.5–5.1)
Sodium: 138 mmol/L (ref 135–145)
Total Bilirubin: 0.9 mg/dL (ref 0.3–1.2)
Total Protein: 7.2 g/dL (ref 6.5–8.1)

## 2022-07-27 LAB — CBC WITH DIFFERENTIAL (CANCER CENTER ONLY)
Abs Immature Granulocytes: 0.01 10*3/uL (ref 0.00–0.07)
Basophils Absolute: 0 10*3/uL (ref 0.0–0.1)
Basophils Relative: 0 %
Eosinophils Absolute: 0 10*3/uL (ref 0.0–0.5)
Eosinophils Relative: 1 %
HCT: 31.9 % — ABNORMAL LOW (ref 39.0–52.0)
Hemoglobin: 10.3 g/dL — ABNORMAL LOW (ref 13.0–17.0)
Immature Granulocytes: 0 %
Lymphocytes Relative: 39 %
Lymphs Abs: 1.1 10*3/uL (ref 0.7–4.0)
MCH: 25.2 pg — ABNORMAL LOW (ref 26.0–34.0)
MCHC: 32.3 g/dL (ref 30.0–36.0)
MCV: 78.2 fL — ABNORMAL LOW (ref 80.0–100.0)
Monocytes Absolute: 0.5 10*3/uL (ref 0.1–1.0)
Monocytes Relative: 18 %
Neutro Abs: 1.2 10*3/uL — ABNORMAL LOW (ref 1.7–7.7)
Neutrophils Relative %: 42 %
Platelet Count: 39 10*3/uL — ABNORMAL LOW (ref 150–400)
RBC: 4.08 MIL/uL — ABNORMAL LOW (ref 4.22–5.81)
RDW: 16.1 % — ABNORMAL HIGH (ref 11.5–15.5)
WBC Count: 2.9 10*3/uL — ABNORMAL LOW (ref 4.0–10.5)
nRBC: 0 % (ref 0.0–0.2)

## 2022-07-27 MED ORDER — EPOETIN ALFA-EPBX 10000 UNIT/ML IJ SOLN
20000.0000 [IU] | Freq: Once | INTRAMUSCULAR | Status: AC
  Administered 2022-07-27: 20000 [IU] via SUBCUTANEOUS
  Filled 2022-07-27: qty 2

## 2022-07-27 MED ORDER — EPOETIN ALFA-EPBX 20000 UNIT/ML IJ SOLN: 20000.0000 [IU] | Freq: Once | INTRAMUSCULAR | Status: DC

## 2022-07-27 NOTE — Patient Instructions (Signed)

## 2022-08-03 ENCOUNTER — Other Ambulatory Visit: Payer: Self-pay | Admitting: *Deleted

## 2022-08-03 MED ORDER — TERAZOSIN HCL 2 MG PO CAPS: ORAL_CAPSULE | ORAL | 1 refills | Status: DC

## 2022-08-07 ENCOUNTER — Ambulatory Visit (INDEPENDENT_AMBULATORY_CARE_PROVIDER_SITE_OTHER): Payer: PPO

## 2022-08-07 VITALS — Ht 68.0 in | Wt 170.0 lb

## 2022-08-07 DIAGNOSIS — Z0000 Encounter for general adult medical examination without abnormal findings: Secondary | ICD-10-CM

## 2022-08-07 NOTE — Patient Instructions (Signed)
Justin [name] , Thank you for taking time to come for your Medicare Wellness Visit. I appreciate your ongoing commitment to your health goals. Please review the following plan we discussed and let me know if I can assist you in the future.   These are the goals we discussed:  Goals      My goal for 2024 is to maintain my health by staying independent.        This is a list of the screening recommended for you and due dates:  Health Maintenance  Topic Date Due   Zoster (Shingles) Vaccine (1 of 2) Never done   COVID-19 Vaccine (4 - 2023-24 season) 03/03/2022   Medicare Annual Wellness Visit  08/08/2023   DTaP/Tdap/Td vaccine (3 - Td or Tdap) 05/24/2032   Pneumonia Vaccine  Completed   Flu Shot  Completed   HPV Vaccine  Aged Out    Advanced directives: No  Conditions/risks identified: Yes  Next appointment: Follow up in one year for your annual wellness visit.   Preventive Care 93 Years and Older, Male  Preventive care refers to lifestyle choices and visits with your health care provider that can promote health and wellness. What does preventive care include? A yearly physical exam. This is also called an annual well check. Dental exams once or twice a year. Routine eye exams. Ask your health care provider how often you should have your eyes checked. Personal lifestyle choices, including: Daily care of your teeth and gums. Regular physical activity. Eating a healthy diet. Avoiding tobacco and drug use. Limiting alcohol use. Practicing safe sex. Taking low doses of aspirin every day. Taking vitamin and mineral supplements as recommended by your health care provider. What happens during an annual well check? The services and screenings done by your health care provider during your annual well check will depend on your age, overall health, lifestyle risk factors, and family history of disease. Counseling  Your health care provider may ask you questions about your: Alcohol  use. Tobacco use. Drug use. Emotional well-being. Home and relationship well-being. Sexual activity. Eating habits. History of falls. Memory and ability to understand (cognition). Work and work Statistician. Screening  You may have the following tests or measurements: Height, weight, and BMI. Blood pressure. Lipid and cholesterol levels. These may be checked every 5 years, or more frequently if you are over 42 years old. Skin check. Lung cancer screening. You may have this screening every year starting at age 50 if you have a 30-pack-year history of smoking and currently smoke or have quit within the past 15 years. Fecal occult blood test (FOBT) of the stool. You may have this test every year starting at age 19. Flexible sigmoidoscopy or colonoscopy. You may have a sigmoidoscopy every 5 years or a colonoscopy every 10 years starting at age 18. Prostate cancer screening. Recommendations will vary depending on your family history and other risks. Hepatitis C blood test. Hepatitis B blood test. Sexually transmitted disease (STD) testing. Diabetes screening. This is done by checking your blood sugar (glucose) after you have not eaten for a while (fasting). You may have this done every 1-3 years. Abdominal aortic aneurysm (AAA) screening. You may need this if you are a current or former smoker. Osteoporosis. You may be screened starting at age 34 if you are at high risk. Talk with your health care provider about your test results, treatment options, and if necessary, the need for more tests. Vaccines  Your health care provider may recommend  certain vaccines, such as: Influenza vaccine. This is recommended every year. Tetanus, diphtheria, and acellular pertussis (Tdap, Td) vaccine. You may need a Td booster every 10 years. Zoster vaccine. You may need this after age 73. Pneumococcal 13-valent conjugate (PCV13) vaccine. One dose is recommended after age 32. Pneumococcal polysaccharide  (PPSV23) vaccine. One dose is recommended after age 72. Talk to your health care provider about which screenings and vaccines you need and how often you need them. This information is not intended to replace advice given to you by your health care provider. Make sure you discuss any questions you have with your health care provider. Document Released: 07/16/2015 Document Revised: 03/08/2016 Document Reviewed: 04/20/2015 Elsevier Interactive Patient Education  2017 [employer] Prevention in the Home Falls can cause injuries. They can happen to people of all ages. There are many things you can do to make your home safe and to help prevent falls. What can I do on the outside of my home? Regularly fix the edges of walkways and driveways and fix any cracks. Remove anything that might make you trip as you walk through a door, such as a raised step or threshold. Trim any bushes or trees on the path to your home. Use bright outdoor lighting. Clear any walking paths of anything that might make someone trip, such as rocks or tools. Regularly check to see if handrails are loose or broken. Make sure that both sides of any steps have handrails. Any raised decks and porches should have guardrails on the edges. Have any leaves, snow, or ice cleared regularly. Use sand or salt on walking paths during winter. Clean up any spills in your garage right away. This includes oil or grease spills. What can I do in the bathroom? Use night lights. Install grab bars by the toilet and in the tub and shower. Do not use towel bars as grab bars. Use non-skid mats or decals in the tub or shower. If you need to sit down in the shower, use a plastic, non-slip stool. Keep the floor dry. Clean up any water that spills on the floor as soon as it happens. Remove soap buildup in the tub or shower regularly. Attach bath mats securely with double-sided non-slip rug tape. Do not have throw rugs and other things on the  floor that can make you trip. What can I do in the bedroom? Use night lights. Make sure that you have a light by your bed that is easy to reach. Do not use any sheets or blankets that are too big for your bed. They should not hang down onto the floor. Have a firm chair that has side arms. You can use this for support while you get dressed. Do not have throw rugs and other things on the floor that can make you trip. What can I do in the kitchen? Clean up any spills right away. Avoid walking on wet floors. Keep items that you use a lot in easy-to-reach places. If you need to reach something above you, use a strong step stool that has a grab bar. Keep electrical cords out of the way. Do not use floor polish or wax that makes floors slippery. If you must use wax, use non-skid floor wax. Do not have throw rugs and other things on the floor that can make you trip. What can I do with my stairs? Do not leave any items on the stairs. Make sure that there are handrails on both sides of the  stairs and use them. Fix handrails that are broken or loose. Make sure that handrails are as long as the stairways. Check any carpeting to make sure that it is firmly attached to the stairs. Fix any carpet that is loose or worn. Avoid having throw rugs at the top or bottom of the stairs. If you do have throw rugs, attach them to the floor with carpet tape. Make sure that you have a light switch at the top of the stairs and the bottom of the stairs. If you do not have them, ask someone to add them for you. What else can I do to help prevent falls? Wear shoes that: Do not have high heels. Have rubber bottoms. Are comfortable and fit you well. Are closed at the toe. Do not wear sandals. If you use a stepladder: Make sure that it is fully opened. Do not climb a closed stepladder. Make sure that both sides of the stepladder are locked into place. Ask someone to hold it for you, if possible. Clearly mark and make  sure that you can see: Any grab bars or handrails. First and last steps. Where the edge of each step is. Use tools that help you move around (mobility aids) if they are needed. These include: Canes. Walkers. Scooters. Crutches. Turn on the lights when you go into a dark area. Replace any light bulbs as soon as they burn out. Set up your furniture so you have a clear path. Avoid moving your furniture around. If any of your floors are uneven, fix them. If there are any pets around you, be aware of where they are. Review your medicines with your doctor. Some medicines can make you feel dizzy. This can increase your chance of falling. Ask your doctor what other things that you can do to help prevent falls. This information is not intended to replace advice given to you by your health care provider. Make sure you discuss any questions you have with your health care provider. Document Released: 04/15/2009 Document Revised: 11/25/2015 Document Reviewed: 07/24/2014 Elsevier Interactive Patient Education  2017 [employer].

## 2022-08-07 NOTE — Progress Notes (Cosign Needed Addendum)
Virtual Visit via Telephone Note  I connected with  Justin [name]. on 08/07/22 at  9:45 AM EST by telephone and verified that I am speaking with the correct person using two identifiers.  Location: Patient: Home Provider: [place] Persons participating in the virtual visit: [place]   I discussed the limitations, risks, security and privacy concerns of performing an evaluation and management service by telephone and the availability of in person appointments. The patient expressed understanding and agreed to proceed.  Interactive audio and video telecommunications were attempted between this nurse and patient, however failed, due to patient having technical difficulties OR patient did not have access to video capability.  We continued and completed visit with audio only.  Some vital signs may be absent or patient reported.   Sheral Flow, LPN  Subjective:   Justin Briar. is a 82 y.o. male who presents for Medicare Annual/Subsequent preventive examination.  Review of Systems     Cardiac Risk Factors include: advanced age (>52mn, >>81women)     Objective:    Today's Vitals   08/07/22 0947  Weight: 170 lb (77.1 kg)  Height: 5' 8"$  (1.727 m)  PainSc: 0-No pain   Body mass index is 25.85 kg/m.     08/07/2022    9:50 AM 12/07/2021    9:06 AM 07/25/2021   10:46 AM 11/09/2020    4:13 PM 10/28/2020   10:24 AM 07/09/2020   11:02 AM 06/14/2020   10:57 AM  Advanced Directives  Does Patient Have a Medical Advance Directive? No No Yes No No No No  Type of AComptrollerLiving will      Copy of [place] Chart?   No - copy requested      Would patient like information on creating a medical advance directive? No - Patient declined   No - Patient declined No - Patient declined No - Patient declined No - Patient declined    Current Medications (verified) Outpatient Encounter  Medications as of 08/07/2022  Medication Sig   Acetaminophen (TYLENOL PO) Tylenol   amLODipine (NORVASC) 5 MG tablet Take 1 tablet (5 mg total) by mouth daily.   calcium carbonate (TUMS EX) 750 MG chewable tablet Chew 1 tablet by mouth daily.   carbidopa-levodopa (SINEMET IR) 25-100 MG tablet For breakthrough restless leg, you can take 1 tablet at bedtime.  OK to take extra tab as needed during the day. (Patient taking differently: Take 1 tablet by mouth at bedtime.)   Cholecalciferol 1000 UNITS tablet Take 3,000 Units by mouth daily.   Cyanocobalamin (VITAMIN B-12) 1000 MCG SUBL DISSOLVE 2 TABLETS IN MOUTH EVERY DAY   ferrous sulfate 325 (65 FE) MG tablet Take 650 mg by mouth daily with breakfast.   finasteride (PROSCAR) 5 MG tablet Take 1 tablet (5 mg total) by mouth daily.   gabapentin (NEURONTIN) 300 MG capsule Take 1 capsule (300 mg total) by mouth 4 (four) times daily.   magnesium hydroxide (MILK OF MAGNESIA) 400 MG/5ML suspension Take 15 mLs by mouth daily as needed for mild constipation.   meclizine (ANTIVERT) 12.5 MG tablet TAKE 1 TABLET BY MOUTH 3 TIMES DAILY AS NEEDED FOR DIZZINESS.   Multiple Vitamins-Minerals (PRESERVISION AREDS PO) Take 2 tablets by mouth 2 (two) times daily.   Oxycodone HCl 10 MG TABS Take 10 mg by mouth 5 (five) times daily as needed (pain). Typically only uses 4 times daily  Polyethyl Glycol-Propyl Glycol (SYSTANE OP) Place 1 drop into both eyes 2 (two) times daily as needed (dry eyes). [facility]   pravastatin (PRAVACHOL) 20 MG tablet Take 1 tablet (20 mg total) by mouth daily.   rOPINIRole (REQUIP) 2 MG tablet TAKE 1 TABLET BY MOUTH AT 12 PM 1 TABLET AT 4 PM AND 1 TABLETS AT BEDTIME   tamsulosin (FLOMAX) 0.4 MG CAPS capsule Take 1 capsule (0.4 mg total) by mouth in the morning.   terazosin (HYTRIN) 2 MG capsule TAKE 1 CAPSULE BY MOUTH EVERYDAY AT BEDTIME   timolol (TIMOPTIC) 0.5 % ophthalmic solution Place 1 drop into the left eye 2 (two) times daily.   No  facility-administered encounter medications on file as of 08/07/2022.    Allergies (verified) Levofloxacin   History: Past Medical History:  Diagnosis Date   Alcoholism ([place])    Dry 13 years   Basal cell carcinoma    Right Chest   BPH (benign prostatic hyperplasia)    Diverticulosis of colon    Dizzy spells    none recent   DJD (degenerative joint disease)    lower back   Elbow fracture, left 2009   [name]   GERD (gastroesophageal reflux disease)    Glaucoma    History of TIAs 1 yrs ago   Hyperkeratosis    LBP (low back pain)    Macular degeneration    left wet right dry sees [name]   Melanoma [facility]) 2009   x3 [name]   Osteoarthritis    Restless leg syndrome    Thrombocytopenia ([place]) 11/18/2021   Tinnitus    Tobacco abuse    Uses walker    Wears glasses    reading   Wears partial dentures    upper   Past Surgical History:  Procedure Laterality Date   CATARACT EXTRACTION Bilateral 2018   HARDWARE REMOVAL Left 07/09/2020   Procedure: Left elbow hardware removal;  Surgeon: [name], MD;  Location: [facility];  Service: Orthopedics;  Laterality: Left;  60 mins   mda     Dr. [name] wet injection   MELANOMA EXCISION  2009   x3   RECONSTRUCTION MEDIAL COLLATERAL LIGAMENT ELBOW W/ TENDON GRAFT  2009   Left, [name]   TONSILLECTOMY  as child   TOTAL KNEE ARTHROPLASTY Right 11/09/2020   Procedure: TOTAL KNEE ARTHROPLASTY;  Surgeon: [name], MD;  Location: [facility];  Service: Orthopedics;  Laterality: Right;   Family History  Problem Relation Age of Onset   Cancer Mother        ?breast   Cancer Father        Lung   Social History   Socioeconomic History   Marital status: Married    Spouse name: Not on file   Number of children: 2   Years of education: Not on file   Highest education level: Not on file  Occupational History   Occupation: [place] Veteran - [employer]    Employer: RETIRED   Occupation: Chiropodist    Occupation: Heritage manager  Tobacco Use   Smoking status: Former    Packs/day: 3.00    Years: 38.00    Total pack years: 114.00    Types: Cigarettes    Quit date: 1986    Years since quitting: 38.1   Smokeless tobacco: Never   Tobacco comments:    states he quit May 15 1985  Vaping Use   Vaping Use: Never used  Substance and Sexual Activity   Alcohol use: No    Comment: PREVIOUS - DRY 86yr   Drug use: Not Currently   Sexual activity: Not Currently  Other Topics Concern   Not on file  Social History Narrative   Right handed   One story home   Drinks coffee q am   Social Determinants of Health   Financial Resource Strain: Low Risk  (08/07/2022)   Overall Financial Resource Strain (CARDIA)    Difficulty of Paying Living Expenses: Not hard at all  Food Insecurity: No Food Insecurity (08/07/2022)   Hunger Vital Sign    Worried About Running Out of Food in the Last Year: Never true    Ran Out of Food in the Last Year: Never true  Transportation Needs: No Transportation Needs (08/07/2022)   PRAPARE - THydrologist(Medical): No    Lack of Transportation (Non-Medical): No  Physical Activity: Inactive (08/07/2022)   Exercise Vital Sign    Days of Exercise per Week: 0 days    Minutes of Exercise per Session: 0 min  Stress: No Stress Concern Present (08/07/2022)   [place]   Feeling of Stress : Not at all  Social Connections: Moderately Isolated (08/07/2022)   Social Connection and Isolation Panel [NHANES]    Frequency of Communication with Friends and Family: Twice a week    Frequency of Social Gatherings with Friends and Family: Twice a week    Attends Religious Services: Never    APrintmaker No    Attends CMusic therapist Never    Marital Status: Married    Tobacco Counseling Counseling given: Not Answered Tobacco comments: states  he quit May 15 1985   Clinical Intake:  Pre-visit preparation completed: Yes  Pain : 0-10 Pain Score: 0-No pain     BMI - recorded: 25.85 Nutritional Risks: None Diabetes: No  How often do you need to have someone help you when you read instructions, pamphlets, or other written materials from your doctor or pharmacy?: 1 - Never What is the last grade level you completed in school?: Bachelor's Degree [employer]  Diabetic? No  Interpreter Needed?: No  Information entered by :: SLisette Abu LPN.   Activities of Daily Living    08/07/2022    9:54 AM  In your present state of health, do you have any difficulty performing the following activities:  Hearing? 0  Vision? 0  Difficulty concentrating or making decisions? 0  Walking or climbing stairs? 0  Dressing or bathing? 0  Doing errands, shopping? 0  Preparing Food and eating ? N  Using the Toilet? N  In the past six months, have you accidently leaked urine? N  Do you have problems with loss of bowel control? N  Managing your Medications? N  Managing your Finances? N  Housekeeping or managing your Housekeeping? N    Patient Care Team: Plotnikov, AEvie Lacks MD as PCP - General AElouise Munroe MD as PCP - Cardiology (Cardiology) HCarol Ada MD as Consulting Physician (Gastroenterology) JMartinique Amy, MD as Consulting Physician (Dermatology) PAlda Berthold DO as Consulting Physician (Neurology) RSuella Broad MD as Consulting Physician (Physical Medicine and Rehabilitation) OParalee Cancel MD as Consulting Physician (Orthopedic Surgery) Szabat, DDarnelle Maffucci RMerr[facility](Inactive) as Pharmacist (Pharmacist) DOrson Slick MD as Consulting Physician (Hematology and Oncology)  Indicate any recent Medical Services you may have received from other  than [facility] providers in the past year (date may be approximate).     Assessment:   This is a routine wellness examination for [place].  Hearing/Vision screen Hearing  Screening - Comments:: Denies hearing difficulties   Vision Screening - Comments:: Wears reading glasses - up to date with routine eye exams with [facility]   Dietary issues and exercise activities discussed: Current Exercise Habits: The patient does not participate in regular exercise at present, Exercise limited by: None identified   Goals Addressed             This Visit's Progress    My goal for 2024 is to maintain my health by staying independent.        Depression Screen    08/07/2022    9:54 AM 07/25/2021   10:46 AM 07/25/2021   10:44 AM 06/14/2020   10:55 AM 06/12/2019    9:19 AM 05/14/2018    3:12 PM 05/03/2017    8:24 AM  PHQ 2/9 Scores  PHQ - 2 Score 0 0 0 0 0 1 2  PHQ- 9 Score     2 4 3    $ Fall Risk    08/07/2022    9:50 AM 07/19/2022    9:00 AM 10/18/2021   11:02 AM 07/25/2021   10:46 AM 06/14/2020   10:58 AM  Fall Risk   Falls in the past year? 0 0 1 0 0  Number falls in past yr: 0 0 0 0 0  Injury with Fall? 0 0 0 0 0  Risk for fall due to : No Fall Risks No Fall Risks History of fall(s);Impaired balance/gait;Impaired mobility  No Fall Risks  Follow up Falls prevention discussed Falls evaluation completed Falls evaluation completed Falls evaluation completed Falls evaluation completed  Comment    cane     FALL RISK PREVENTION PERTAINING TO THE HOME:  Any stairs in or around the home? No  If so, are there any without handrails? No  Home free of loose throw rugs in walkways, pet beds, electrical cords, etc? Yes  Adequate lighting in your home to reduce risk of falls? Yes   ASSISTIVE DEVICES UTILIZED TO PREVENT FALLS:  Life alert? No  Use of a cane, walker or w/c? No  Grab bars in the bathroom? No  Shower chair or bench in shower? Yes  Elevated toilet seat or a handicapped toilet? Yes   TIMED UP AND GO:  Was the test performed? No . Phone Visit  Cognitive Function:    05/03/2017    8:33 AM  MMSE - Mini Mental State Exam  Orientation to  time 5  Orientation to Place 5  Registration 3  Attention/ Calculation 5  Recall 2  Language- name 2 objects 2  Language- repeat 1  Language- follow 3 step command 3  Language- read & follow direction 1  Write a sentence 1  Copy design 1  Total score 29        08/07/2022    9:51 AM  6CIT Screen  What Year? 0 points  What month? 0 points  What time? 0 points  Count back from 20 0 points  Months in reverse 0 points  Repeat phrase 0 points  Total Score 0 points    Immunizations Immunization History  Administered Date(s) Administered   Fluad Quad(high Dose 65+) 03/19/2019, 03/15/2020, 04/19/2021, 04/25/2022   Influenza Split 03/30/2011, 05/09/2012   Influenza Whole 04/22/2008, 03/31/2009   Influenza, High Dose Seasonal PF 05/07/2013, 03/23/2016,  03/27/2017, 04/03/2018   Influenza,inj,Quad PF,6+ Mos 04/07/2014, 04/29/2015   Influenza-Unspecified 04/02/2006   PFIZER(Purple Top)SARS-COV-2 Vaccination 08/07/2019, 09/01/2019, 04/27/2020   PNEUMOCOCCAL CONJUGATE-20 05/23/2022   Pneumococcal Conjugate-13 06/13/2013   Pneumococcal Polysaccharide-23 10/27/2009   Pneumococcal-Unspecified 04/02/2004   Td 06/23/2010   Tdap 05/24/2022    TDAP status: Up to date  Flu Vaccine status: Up to date  Pneumococcal vaccine status: Up to date  Covid-19 vaccine status: Completed vaccines  Qualifies for Shingles Vaccine? Yes   Zostavax completed No   Shingrix Completed?: No.    Education has been provided regarding the importance of this vaccine. Patient has been advised to call insurance company to determine out of pocket expense if they have not yet received this vaccine. Advised may also receive vaccine at local pharmacy or Health Dept. Verbalized acceptance and understanding.  Screening Tests Health Maintenance  Topic Date Due   Zoster Vaccines- Shingrix (1 of 2) Never done   COVID-19 Vaccine (4 - 2023-24 season) 03/03/2022   Medicare Annual Wellness (AWV)  08/08/2023    DTaP/Tdap/Td (3 - Td or Tdap) 05/24/2032   Pneumonia Vaccine 44+ Years old  Completed   INFLUENZA VACCINE  Completed   HPV VACCINES  Aged Out    Health Maintenance  Health Maintenance Due  Topic Date Due   Zoster Vaccines- Shingrix (1 of 2) Never done   COVID-19 Vaccine (4 - 2023-24 season) 03/03/2022    Colorectal cancer screening: No longer required.   Lung Cancer Screening: (Low Dose CT Chest recommended if Age 50-80 years, 30 pack-year currently smoking OR have quit w/in 15years.) does not qualify.   Lung Cancer Screening Referral: no  Additional Screening:  Hepatitis C Screening: does not qualify; Completed no  Vision Screening: Recommended annual ophthalmology exams for early detection of glaucoma and other disorders of the eye. Is the patient up to date with their annual eye exam?  Yes  Who is the provider or what is the name of the office in which the patient attends annual eye exams? [facility] If pt is not established with a provider, would they like to be referred to a provider to establish care? No .   Dental Screening: Recommended annual dental exams for proper oral hygiene  Community Resource Referral / Chronic Care Management: CRR required this visit?  No   CCM required this visit?  No      Plan:     I have personally reviewed and noted the following in the patient's chart:   Medical and social history Use of alcohol, tobacco or illicit drugs  Current medications and supplements including opioid prescriptions. Patient is currently taking opioid prescriptions. Information provided to patient regarding non-opioid alternatives. Patient advised to discuss non-opioid treatment plan with their provider. Functional ability and status Nutritional status Physical activity Advanced directives List of other physicians Hospitalizations, surgeries, and ER visits in previous 12 months Vitals Screenings to include cognitive, depression, and falls Referrals  and appointments  In addition, I have reviewed and discussed with patient certain preventive protocols, quality metrics, and best practice recommendations. A written personalized care plan for preventive services as well as general preventive health recommendations were provided to patient.     Sheral Flow, LPN   [id number]   Nurse Notes: N/A   Medical screening examination/treatment/procedure(s) were performed by non-physician practitioner and as supervising physician I was immediately available for consultation/collaboration.  I agree with above. [name], MD

## 2022-08-10 ENCOUNTER — Other Ambulatory Visit: Payer: Self-pay

## 2022-08-10 ENCOUNTER — Inpatient Hospital Stay: Payer: PPO | Attending: Physician Assistant | Admitting: Hematology and Oncology

## 2022-08-10 ENCOUNTER — Inpatient Hospital Stay: Payer: PPO

## 2022-08-10 VITALS — BP 141/59 | HR 73 | Temp 97.5°F | Resp 15 | Wt 166.2 lb

## 2022-08-10 DIAGNOSIS — D61818 Other pancytopenia: Secondary | ICD-10-CM | POA: Diagnosis not present

## 2022-08-10 DIAGNOSIS — D469 Myelodysplastic syndrome, unspecified: Secondary | ICD-10-CM | POA: Diagnosis not present

## 2022-08-10 LAB — CMP (CANCER CENTER ONLY)
ALT: 5 U/L (ref 0–44)
AST: 16 U/L (ref 15–41)
Albumin: 3.9 g/dL (ref 3.5–5.0)
Alkaline Phosphatase: 54 U/L (ref 38–126)
Anion gap: 5 (ref 5–15)
BUN: 20 mg/dL (ref 8–23)
CO2: 29 mmol/L (ref 22–32)
Calcium: 9 mg/dL (ref 8.9–10.3)
Chloride: 106 mmol/L (ref 98–111)
Creatinine: 1.13 mg/dL (ref 0.61–1.24)
GFR, Estimated: >60 mL/min (ref >60)
Glucose, Bld: 89 mg/dL (ref 70–99)
Potassium: 4 mmol/L (ref 3.5–5.1)
Sodium: 140 mmol/L (ref 135–145)
Total Bilirubin: 0.7 mg/dL (ref 0.3–1.2)
Total Protein: 6.4 g/dL — ABNORMAL LOW (ref 6.5–8.1)

## 2022-08-10 LAB — CBC WITH DIFFERENTIAL (CANCER CENTER ONLY)
Abs Immature Granulocytes: 0.02 10*3/uL (ref 0.00–0.07)
Basophils Absolute: 0 10*3/uL (ref 0.0–0.1)
Basophils Relative: 1 %
Eosinophils Absolute: 0 10*3/uL (ref 0.0–0.5)
Eosinophils Relative: 1 %
HCT: 33.4 % — ABNORMAL LOW (ref 39.0–52.0)
Hemoglobin: 10.3 g/dL — ABNORMAL LOW (ref 13.0–17.0)
Immature Granulocytes: 1 %
Lymphocytes Relative: 44 %
Lymphs Abs: 1.5 10*3/uL (ref 0.7–4.0)
MCH: 25.3 pg — ABNORMAL LOW (ref 26.0–34.0)
MCHC: 30.8 g/dL (ref 30.0–36.0)
MCV: 82.1 fL (ref 80.0–100.0)
Monocytes Absolute: 0.9 10*3/uL (ref 0.1–1.0)
Monocytes Relative: 25 %
Neutro Abs: 1 10*3/uL — ABNORMAL LOW (ref 1.7–7.7)
Neutrophils Relative %: 28 %
Platelet Count: 41 10*3/uL — ABNORMAL LOW (ref 150–400)
RBC: 4.07 MIL/uL — ABNORMAL LOW (ref 4.22–5.81)
RDW: 16.5 % — ABNORMAL HIGH (ref 11.5–15.5)
WBC Count: 3.4 10*3/uL — ABNORMAL LOW (ref 4.0–10.5)
nRBC: 0 % (ref 0.0–0.2)

## 2022-08-10 MED ORDER — EPOETIN ALFA-EPBX 10000 UNIT/ML IJ SOLN
20000.0000 [IU] | Freq: Once | INTRAMUSCULAR | Status: AC
  Administered 2022-08-10: 20000 [IU] via SUBCUTANEOUS
  Filled 2022-08-10: qty 2

## 2022-08-10 NOTE — Progress Notes (Signed)
[place] [phone]   [phone]  PROGRESS NOTE  Patient Care Team: Plotnikov, Evie Lacks, MD as PCP - General Elouise Munroe, MD as PCP - Cardiology (Cardiology) Carol Ada, MD as Consulting Physician (Gastroenterology) Martinique, Amy, MD as Consulting Physician (Dermatology) Alda Berthold, DO as Consulting Physician (Neurology) [name], MD as Consulting Physician (Physical Medicine and Rehabilitation) [name], MD as Consulting Physician (Orthopedic Surgery) Szabat, Darnelle Maffucci, [facility] (Inactive) as Pharmacist (Pharmacist) Orson Slick, MD as Consulting Physician (Hematology and Oncology)  Hematological/Oncological History # Pancytopenia #Myelodysplastic Syndrome, Single Lineage Dysplasia 11/18/2021: WBC 2.4, ANC 1.1, Hgb 9.7, MCV 84.0, Plt 58. 12/07/2021: Establish care with Dr. Lorenso Courier and Dede Query 12/19/2021: Bone marrow biopsy which showed a low-grade myelodysplastic syndrome.  12/28/2021: started epoetin alpha shots 20,000 units q 2 weeks.   Interval History:  Justin [name]. 82 y.o. male with medical history significant for newly diagnosed low-grade myelodysplastic syndrome who presents for a follow up visit. The patient's last visit was on 05/18/2022.  Since that time he has continued erythropoietin shots.  On exam today Justin [name] reports he unfortunately hit his head on the hinges of the door when looking out of window.  He notes that he bandaged it up himself.  On the left side of his head he has bruising which he reports bled some but has been under good control.  It is not painful or sore.  He notes that his balance continues to be somewhat poor but he has not had any recent falls.  He is losing weight due to poor appetite.  He is doing his best to try to drink milk.  He notes he is not having any bleeding from any other sources such as nosebleeds, gum bleeding, or dark stools.  Overall he does feel some  occasional fatigue but notes his energy levels are tolerable.  He notes that the shots do not cause any difficulty but he is also not seen much of a benefit.  He denies any fevers, chills, sweats, nausea vomiting or diarrhea.  A full 10 point ROS is listed below.  At this time he is willing and able to proceed with erythropoietin shots.   MEDICAL HISTORY:  Past Medical History:  Diagnosis Date   Alcoholism ([place])    Dry 13 years   Basal cell carcinoma    Right Chest   BPH (benign prostatic hyperplasia)    Diverticulosis of colon    Dizzy spells    none recent   DJD (degenerative joint disease)    lower back   Elbow fracture, left 2009   [name]   GERD (gastroesophageal reflux disease)    Glaucoma    History of TIAs 1 yrs ago   Hyperkeratosis    LBP (low back pain)    Macular degeneration    left wet right dry sees [name]   Melanoma [facility]) 2009   x3 [name]   Osteoarthritis    Restless leg syndrome    Thrombocytopenia ([place]) 11/18/2021   Tinnitus    Tobacco abuse    Uses walker    Wears glasses    reading   Wears partial dentures    upper    SURGICAL HISTORY: Past Surgical History:  Procedure Laterality Date   CATARACT EXTRACTION Bilateral 2018   HARDWARE REMOVAL Left 07/09/2020   Procedure: Left elbow hardware removal;  Surgeon: [name], MD;  Location: [facility];  Service: Orthopedics;  Laterality: Left;  60 mins   mda     Dr. [name] wet injection   MELANOMA EXCISION  2009   x3   RECONSTRUCTION MEDIAL COLLATERAL LIGAMENT ELBOW W/ TENDON GRAFT  2009   Left, [name]   TONSILLECTOMY  as child   TOTAL KNEE ARTHROPLASTY Right 11/09/2020   Procedure: TOTAL KNEE ARTHROPLASTY;  Surgeon: [name], MD;  Location: [facility];  Service: Orthopedics;  Laterality: Right;    SOCIAL HISTORY: Social History   Socioeconomic History   Marital status: Married    Spouse name: Not on file   Number of children: 2   Years of education: Not  on file   Highest education level: Not on file  Occupational History   Occupation: [place] Veteran - [employer]    Employer: RETIRED   Occupation: Chiropodist   Occupation: Heritage manager  Tobacco Use   Smoking status: Former    Packs/day: 3.00    Years: 38.00    Total pack years: 114.00    Types: Cigarettes    Quit date: 1986    Years since quitting: 38.1   Smokeless tobacco: Never   Tobacco comments:    states he quit May 15 1985  Vaping Use   Vaping Use: Never used  Substance and Sexual Activity   Alcohol use: No    Comment: PREVIOUS - DRY 36yr   Drug use: Not Currently   Sexual activity: Not Currently  Other Topics Concern   Not on file  Social History Narrative   Right handed   One story home   Drinks coffee q am   Social Determinants of Health   Financial Resource Strain: Low Risk  (08/07/2022)   Overall Financial Resource Strain (CARDIA)    Difficulty of Paying Living Expenses: Not hard at all  Food Insecurity: No Food Insecurity (08/07/2022)   Hunger Vital Sign    Worried About Running Out of Food in the Last Year: Never true    RCarlislein the Last Year: Never true  Transportation Needs: No Transportation Needs (08/07/2022)   PRAPARE - THydrologist(Medical): No    Lack of Transportation (Non-Medical): No  Physical Activity: Inactive (08/07/2022)   Exercise Vital Sign    Days of Exercise per Week: 0 days    Minutes of Exercise per Session: 0 min  Stress: No Stress Concern Present (08/07/2022)   [place]   Feeling of Stress : Not at all  Social Connections: Moderately Isolated (08/07/2022)   Social Connection and Isolation Panel [NHANES]    Frequency of Communication with Friends and Family: Twice a week    Frequency of Social Gatherings with Friends and Family: Twice a week    Attends Religious Services: Never    AMarine scientistor  Organizations: No    Attends CArchivistMeetings: Never    Marital Status: Married  IHuman resources officerViolence: Not At Risk (08/07/2022)   Humiliation, Afraid, Rape, and Kick questionnaire    Fear of Current or Ex-Partner: No    Emotionally Abused: No    Physically Abused: No    Sexually Abused: No    FAMILY HISTORY: Family History  Problem Relation Age of Onset   Cancer Mother        ?breast   Cancer Father        Lung    ALLERGIES:  is  allergic to levofloxacin.  MEDICATIONS:  Current Outpatient Medications  Medication Sig Dispense Refill   Acetaminophen (TYLENOL PO) Tylenol     amLODipine (NORVASC) 5 MG tablet Take 1 tablet (5 mg total) by mouth daily. 90 tablet 3   calcium carbonate (TUMS EX) 750 MG chewable tablet Chew 1 tablet by mouth daily.     carbidopa-levodopa (SINEMET IR) 25-100 MG tablet For breakthrough restless leg, you can take 1 tablet at bedtime.  OK to take extra tab as needed during the day. (Patient taking differently: Take 1 tablet by mouth at bedtime.) 100 tablet 3   Cholecalciferol 1000 UNITS tablet Take 3,000 Units by mouth daily.     Cyanocobalamin (VITAMIN B-12) 1000 MCG SUBL DISSOLVE 2 TABLETS IN MOUTH EVERY DAY 180 tablet 1   finasteride (PROSCAR) 5 MG tablet Take 1 tablet (5 mg total) by mouth daily. 90 tablet 1   gabapentin (NEURONTIN) 300 MG capsule Take 1 capsule (300 mg total) by mouth 4 (four) times daily. 360 capsule 1   magnesium hydroxide (MILK OF MAGNESIA) 400 MG/5ML suspension Take 15 mLs by mouth daily as needed for mild constipation.     meclizine (ANTIVERT) 12.5 MG tablet TAKE 1 TABLET BY MOUTH 3 TIMES DAILY AS NEEDED FOR DIZZINESS. 60 tablet 2   Multiple Vitamins-Minerals (PRESERVISION AREDS PO) Take 2 tablets by mouth 2 (two) times daily.     Oxycodone HCl 10 MG TABS Take 10 mg by mouth 5 (five) times daily as needed (pain). Typically only uses 4 times daily     Polyethyl Glycol-Propyl Glycol (SYSTANE OP) Place 1 drop into both  eyes 2 (two) times daily as needed (dry eyes). [facility] O[facility]     pravastatin (PRAVACHOL) 20 MG tablet Take 1 tablet (20 mg total) by mouth daily. 90 tablet 1   rOPINIRole (REQUIP) 2 MG tablet TAKE 1 TABLET BY MOUTH AT 12 PM 1 TABLET AT 4 PM AND 1 TABLETS AT BEDTIME 360 tablet 1   tamsulosin (FLOMAX) 0.4 MG CAPS capsule Take 1 capsule (0.4 mg total) by mouth in the morning. 90 capsule 1   terazosin (HYTRIN) 2 MG capsule TAKE 1 CAPSULE BY MOUTH EVERYDAY AT BEDTIME 90 capsule 1   timolol (TIMOPTIC) 0.5 % ophthalmic solution Place 1 drop into the left eye 2 (two) times daily.     ferrous sulfate 325 (65 FE) MG tablet Take 650 mg by mouth daily with breakfast. (Patient not taking: Reported on 08/10/2022)     No current facility-administered medications for this visit.   Facility-Administered Medications Ordered in Other Visits  Medication Dose Route Frequency Provider Last Rate Last Admin   epoetin alfa-epbx (RETACRIT) injection 20,000 Units  20,000 Units Subcutaneous Once Orson Slick, MD        REVIEW OF SYSTEMS:   Constitutional: ( - ) fevers, ( - )  chills , ( - ) night sweats Eyes: ( - ) blurriness of vision, ( - ) double vision, ( - ) watery eyes Ears, nose, mouth, throat, and face: ( - ) mucositis, ( - ) sore throat Respiratory: ( - ) cough, ( - ) dyspnea, ( - ) wheezes Cardiovascular: ( - ) palpitation, ( - ) chest discomfort, ( - ) lower extremity swelling Gastrointestinal:  ( - ) nausea, ( - ) heartburn, ( - ) change in bowel habits Skin: ( - ) abnormal skin rashes Lymphatics: ( - ) new lymphadenopathy, ( - ) easy bruising Neurological: ( - ) numbness, ( - )  tingling, ( - ) new weaknesses Behavioral/Psych: ( - ) mood change, ( - ) new changes  All other systems were reviewed with the patient and are negative.  PHYSICAL EXAMINATION:  Vitals:   08/10/22 0944  BP: (!) 141/59  Pulse: 73  Resp: 15  Temp: (!) 97.5 F (36.4 C)  SpO2: 98%   Filed Weights   08/10/22 0944  Weight:  166 lb 3.2 oz (75.4 kg)    GENERAL: Well-appearing elderly Caucasian male, alert, no distress and comfortable SKIN: skin color, texture, turgor are normal, no rashes or significant lesions EYES: conjunctiva are pink and non-injected, sclera clear LUNGS: clear to auscultation and percussion with normal breathing effort HEART: regular rate & rhythm and no murmurs and no lower extremity edema Musculoskeletal: no cyanosis of digits and no clubbing  PSYCH: alert & oriented x 3, fluent speech NEURO: no focal motor/sensory deficits  LABORATORY DATA:  I have reviewed the data as listed    Latest Ref Rng & Units 08/10/2022    9:19 AM 07/27/2022    9:36 AM 07/13/2022    9:11 AM  CBC  WBC 4.0 - 10.5 K/uL 3.4  2.9  2.8   Hemoglobin 13.0 - 17.0 g/dL 10.3  10.3  10.3   Hematocrit 39.0 - 52.0 % 33.4  31.9  31.1   Platelets 150 - 400 K/uL 41  39  37        Latest Ref Rng & Units 08/10/2022    9:19 AM 07/27/2022    9:36 AM 07/13/2022    9:11 AM  CMP  Glucose 70 - 99 mg/dL 89  104  98   BUN 8 - 23 mg/dL '20  23  22   '$ Creatinine 0.61 - 1.24 mg/dL 1.13  1.16  1.23   Sodium 135 - 145 mmol/L 140  138  139   Potassium 3.5 - 5.1 mmol/L 4.0  3.9  3.8   Chloride 98 - 111 mmol/L 106  105  107   CO2 22 - 32 mmol/L '29  26  26   '$ Calcium 8.9 - 10.3 mg/dL 9.0  9.5  9.2   Total Protein 6.5 - 8.1 g/dL 6.4  7.2  6.7   Total Bilirubin 0.3 - 1.2 mg/dL 0.7  0.9  0.6   Alkaline Phos 38 - 126 U/L 54  53  52   AST 15 - 41 U/L '16  18  18   '$ ALT 0 - 44 U/L '5  5  5     '$ Lab Results  Component Value Date   MPROTEIN Not Observed 12/07/2021   Lab Results  Component Value Date   KPAFRELGTCHN 45.3 (H) 12/07/2021   LAMBDASER 28.9 (H) 12/07/2021   KAPLAMBRATIO 1.57 12/07/2021   RADIOGRAPHIC STUDIES: No results found.  Justin [name]. 82 y.o. male with medical history significant for newly diagnosed low-grade myelodysplastic syndrome who presents for a follow up visit.  After review the  labs, review the records, discussion with the patient the findings are most consistent with myelodysplastic syndrome.  This is confirmed with bone marrow biopsy.  Given that his major issue is a drop in the hemoglobin would recommend pursuing erythropoietin therapy.  Also discussed other options including hypomethylating's as well as Luspatercept.  Given that he is not currently transfusion dependent I think we can begin with erythropoietin and consider the other options if his counts were to worsen.  Additionally research has shown that erythropoietin may actually boost his  platelets and white blood cell count in the process.  The patient voiced understanding of the plan moving forward.  R-IPSS: Low   # Myelodysplastic Syndrome, Single Lineage Dysplasia -- At this time Justin [name] has a low-grade myelodysplasia that is anemia predominant. --Given that his hemoglobin was consistently less than 10 I recommended we proceed with erythropoietin therapy. --Discussed with the patient other options including hypomethylating therapies and Luspatercept, however given that anemia is his primary issue would recommend erythropoietin to see if this helps to elevate his other blood counts as well. --No clear signs of nutritional deficiency during our prior evaluation. - MDS FISH study showed no 5q-. Normal molecular studies.  Plan:  --labs today show white blood cell count white blood cell 3.4, hemoglobin 10.3, MCV 82.1, and platelets of 41.  Neutrophils are 1.0. --Offered to drop down to once monthly shots given his steadily improved hemoglobin.  He notes he would like to continue every 2 weeks. Continue at 20,000 units of Retacrit --RTC in 8 weeks time to assure he is tolerating the shots with continued q 2 week shots.   No orders of the defined types were placed in this encounter.   All questions were answered. The patient knows to call the clinic with any problems, questions or concerns.  A total of more  than 30 minutes were spent on this encounter with face-to-face time and non-face-to-face time, including preparing to see the patient, ordering tests and/or medications, counseling the patient and coordination of care as outlined above.   Ledell Peoples, MD Department of Hematology/Oncology [place] at [facility] Phone: [phone] Pager: [phone] Email: [email or web address]  08/10/2022 10:31 AM

## 2022-08-16 DIAGNOSIS — H353221 Exudative age-related macular degeneration, left eye, with active choroidal neovascularization: Secondary | ICD-10-CM | POA: Diagnosis not present

## 2022-08-16 DIAGNOSIS — H353111 Nonexudative age-related macular degeneration, right eye, early dry stage: Secondary | ICD-10-CM | POA: Diagnosis not present

## 2022-08-16 DIAGNOSIS — H43812 Vitreous degeneration, left eye: Secondary | ICD-10-CM | POA: Diagnosis not present

## 2022-08-18 DIAGNOSIS — H402211 Chronic angle-closure glaucoma, right eye, mild stage: Secondary | ICD-10-CM | POA: Diagnosis not present

## 2022-08-18 DIAGNOSIS — H402222 Chronic angle-closure glaucoma, left eye, moderate stage: Secondary | ICD-10-CM | POA: Diagnosis not present

## 2022-08-21 DIAGNOSIS — L570 Actinic keratosis: Secondary | ICD-10-CM | POA: Diagnosis not present

## 2022-08-21 DIAGNOSIS — D225 Melanocytic nevi of trunk: Secondary | ICD-10-CM | POA: Diagnosis not present

## 2022-08-21 DIAGNOSIS — D692 Other nonthrombocytopenic purpura: Secondary | ICD-10-CM | POA: Diagnosis not present

## 2022-08-21 DIAGNOSIS — L821 Other seborrheic keratosis: Secondary | ICD-10-CM | POA: Diagnosis not present

## 2022-08-21 DIAGNOSIS — Z85828 Personal history of other malignant neoplasm of skin: Secondary | ICD-10-CM | POA: Diagnosis not present

## 2022-08-21 DIAGNOSIS — D1801 Hemangioma of skin and subcutaneous tissue: Secondary | ICD-10-CM | POA: Diagnosis not present

## 2022-08-24 ENCOUNTER — Inpatient Hospital Stay: Payer: PPO

## 2022-08-24 ENCOUNTER — Other Ambulatory Visit: Payer: Self-pay

## 2022-08-24 VITALS — BP 156/79 | HR 40 | Temp 98.2°F | Resp 14

## 2022-08-24 DIAGNOSIS — D469 Myelodysplastic syndrome, unspecified: Secondary | ICD-10-CM | POA: Diagnosis not present

## 2022-08-24 LAB — CBC WITH DIFFERENTIAL (CANCER CENTER ONLY)
Abs Immature Granulocytes: 0.01 10*3/uL (ref 0.00–0.07)
Basophils Absolute: 0 10*3/uL (ref 0.0–0.1)
Basophils Relative: 1 %
Eosinophils Absolute: 0.1 10*3/uL (ref 0.0–0.5)
Eosinophils Relative: 4 %
HCT: 32.2 % — ABNORMAL LOW (ref 39.0–52.0)
Hemoglobin: 10.3 g/dL — ABNORMAL LOW (ref 13.0–17.0)
Immature Granulocytes: 0 %
Lymphocytes Relative: 41 %
Lymphs Abs: 1 10*3/uL (ref 0.7–4.0)
MCH: 25.6 pg — ABNORMAL LOW (ref 26.0–34.0)
MCHC: 32 g/dL (ref 30.0–36.0)
MCV: 80.1 fL (ref 80.0–100.0)
Monocytes Absolute: 0.6 10*3/uL (ref 0.1–1.0)
Monocytes Relative: 25 %
Neutro Abs: 0.7 10*3/uL — ABNORMAL LOW (ref 1.7–7.7)
Neutrophils Relative %: 29 %
Platelet Count: 31 10*3/uL — ABNORMAL LOW (ref 150–400)
RBC: 4.02 MIL/uL — ABNORMAL LOW (ref 4.22–5.81)
RDW: 16.5 % — ABNORMAL HIGH (ref 11.5–15.5)
WBC Count: 2.4 10*3/uL — ABNORMAL LOW (ref 4.0–10.5)
nRBC: 0 % (ref 0.0–0.2)

## 2022-08-24 LAB — CMP (CANCER CENTER ONLY)
ALT: <5 U/L (ref 0–44)
AST: 15 U/L (ref 15–41)
Albumin: 4 g/dL (ref 3.5–5.0)
Alkaline Phosphatase: 52 U/L (ref 38–126)
Anion gap: 6 (ref 5–15)
BUN: 18 mg/dL (ref 8–23)
CO2: 26 mmol/L (ref 22–32)
Calcium: 8.7 mg/dL — ABNORMAL LOW (ref 8.9–10.3)
Chloride: 107 mmol/L (ref 98–111)
Creatinine: 1 mg/dL (ref 0.61–1.24)
GFR, Estimated: >60 mL/min (ref >60)
Glucose, Bld: 107 mg/dL — ABNORMAL HIGH (ref 70–99)
Potassium: 4.1 mmol/L (ref 3.5–5.1)
Sodium: 139 mmol/L (ref 135–145)
Total Bilirubin: 0.8 mg/dL (ref 0.3–1.2)
Total Protein: 6.8 g/dL (ref 6.5–8.1)

## 2022-08-24 MED ORDER — EPOETIN ALFA-EPBX 10000 UNIT/ML IJ SOLN
20000.0000 [IU] | Freq: Once | INTRAMUSCULAR | Status: AC
  Administered 2022-08-24: 20000 [IU] via SUBCUTANEOUS
  Filled 2022-08-24: qty 2

## 2022-08-24 MED ORDER — EPOETIN ALFA-EPBX 20000 UNIT/ML IJ SOLN: 20000.0000 [IU] | Freq: Once | INTRAMUSCULAR | Status: DC

## 2022-08-24 NOTE — Patient Instructions (Signed)

## 2022-08-25 ENCOUNTER — Encounter: Payer: Self-pay | Admitting: Hematology and Oncology

## 2022-09-05 ENCOUNTER — Ambulatory Visit: Payer: PPO | Admitting: Podiatry

## 2022-09-05 DIAGNOSIS — M79675 Pain in left toe(s): Secondary | ICD-10-CM

## 2022-09-05 DIAGNOSIS — L97521 Non-pressure chronic ulcer of other part of left foot limited to breakdown of skin: Secondary | ICD-10-CM

## 2022-09-05 DIAGNOSIS — M79674 Pain in right toe(s): Secondary | ICD-10-CM

## 2022-09-05 DIAGNOSIS — B351 Tinea unguium: Secondary | ICD-10-CM | POA: Diagnosis not present

## 2022-09-05 MED ORDER — MUPIROCIN 2 % EX OINT: 1.0000 | TOPICAL_OINTMENT | Freq: Two times a day (BID) | CUTANEOUS | 2 refills | Status: DC

## 2022-09-06 DIAGNOSIS — M47896 Other spondylosis, lumbar region: Secondary | ICD-10-CM | POA: Diagnosis not present

## 2022-09-06 DIAGNOSIS — M5459 Other low back pain: Secondary | ICD-10-CM | POA: Diagnosis not present

## 2022-09-06 DIAGNOSIS — M5416 Radiculopathy, lumbar region: Secondary | ICD-10-CM | POA: Diagnosis not present

## 2022-09-07 ENCOUNTER — Inpatient Hospital Stay: Payer: PPO

## 2022-09-07 ENCOUNTER — Other Ambulatory Visit: Payer: Self-pay

## 2022-09-07 ENCOUNTER — Inpatient Hospital Stay: Payer: PPO | Attending: Physician Assistant

## 2022-09-07 VITALS — BP 122/65 | HR 62 | Temp 97.6°F | Resp 15 | Ht 68.0 in

## 2022-09-07 DIAGNOSIS — D469 Myelodysplastic syndrome, unspecified: Secondary | ICD-10-CM

## 2022-09-07 LAB — CBC WITH DIFFERENTIAL (CANCER CENTER ONLY)
Abs Immature Granulocytes: 0.01 10*3/uL (ref 0.00–0.07)
Basophils Absolute: 0 10*3/uL (ref 0.0–0.1)
Basophils Relative: 1 %
Eosinophils Absolute: 0.1 10*3/uL (ref 0.0–0.5)
Eosinophils Relative: 4 %
HCT: 33.3 % — ABNORMAL LOW (ref 39.0–52.0)
Hemoglobin: 10.7 g/dL — ABNORMAL LOW (ref 13.0–17.0)
Immature Granulocytes: 0 %
Lymphocytes Relative: 42 %
Lymphs Abs: 1 10*3/uL (ref 0.7–4.0)
MCH: 25.8 pg — ABNORMAL LOW (ref 26.0–34.0)
MCHC: 32.1 g/dL (ref 30.0–36.0)
MCV: 80.2 fL (ref 80.0–100.0)
Monocytes Absolute: 0.7 10*3/uL (ref 0.1–1.0)
Monocytes Relative: 27 %
Neutro Abs: 0.7 10*3/uL — ABNORMAL LOW (ref 1.7–7.7)
Neutrophils Relative %: 26 %
Platelet Count: 34 10*3/uL — ABNORMAL LOW (ref 150–400)
RBC: 4.15 MIL/uL — ABNORMAL LOW (ref 4.22–5.81)
RDW: 16.4 % — ABNORMAL HIGH (ref 11.5–15.5)
WBC Count: 2.5 10*3/uL — ABNORMAL LOW (ref 4.0–10.5)
nRBC: 0 % (ref 0.0–0.2)

## 2022-09-07 LAB — CMP (CANCER CENTER ONLY)
ALT: <5 U/L (ref 0–44)
AST: 18 U/L (ref 15–41)
Albumin: 4.2 g/dL (ref 3.5–5.0)
Alkaline Phosphatase: 54 U/L (ref 38–126)
Anion gap: 7 (ref 5–15)
BUN: 22 mg/dL (ref 8–23)
CO2: 26 mmol/L (ref 22–32)
Calcium: 9.3 mg/dL (ref 8.9–10.3)
Chloride: 108 mmol/L (ref 98–111)
Creatinine: 1.19 mg/dL (ref 0.61–1.24)
GFR, Estimated: >60 mL/min (ref >60)
Glucose, Bld: 136 mg/dL — ABNORMAL HIGH (ref 70–99)
Potassium: 3.8 mmol/L (ref 3.5–5.1)
Sodium: 141 mmol/L (ref 135–145)
Total Bilirubin: 0.9 mg/dL (ref 0.3–1.2)
Total Protein: 7.1 g/dL (ref 6.5–8.1)

## 2022-09-07 MED ORDER — EPOETIN ALFA-EPBX 20000 UNIT/ML IJ SOLN: 20000.0000 [IU] | Freq: Once | INTRAMUSCULAR | Status: DC

## 2022-09-07 MED ORDER — EPOETIN ALFA-EPBX 10000 UNIT/ML IJ SOLN
20000.0000 [IU] | Freq: Once | INTRAMUSCULAR | Status: AC
  Administered 2022-09-07: 20000 [IU] via SUBCUTANEOUS
  Filled 2022-09-07: qty 2

## 2022-09-13 NOTE — Progress Notes (Signed)
Subjective: Chief Complaint  Patient presents with   routine foot care     82 y.o. returns the office today for painful, elongated, thickened toenails which he cannot trim himself and also he has noticed a skin lesion on the left second toe and has had a corn there previously.  Denies any drainage or pus.  No injuries.  No fevers or chills.    PCP: [name], MD Last seen 05/23/2022  Objective: AAO 3, NAD DP/PT pulses palpable, CRT less than 3 seconds Neurological status is unchanged.  Nails hypertrophic, dystrophic, elongated, brittle, discolored 10. There is tenderness overlying the nails 1-5 bilaterally. There is no surrounding erythema or drainage along the nail sites. At the distal aspect of the left second toe this appears to be skin fissure, ulceration just proximal to where the calluses that is described..  No probing to bone, no tunneling.  No drainage or pus or exposed tendon.  There is no fluctuance or crepitation.  No malodor. Hammertoes are present. No pain with calf compression, swelling, warmth, erythema.  Assessment: Patient presents with symptomatic onychomycosis; ulceration left second toe  Plan: -Treatment options including alternatives, risks, complications were discussed -Nails sharply debrided 10 without complication/bleeding. -Hyperkeratotic lesion sharply debrided x 1 without any complications.  There is a skin fissure type ulceration present just proximal to the area where the corn is.  I recommend antibiotic ointment dressing changes daily.  No obvious signs of infection or otherwise will need to monitor very closely.  Mupirocin ointment dressing changes daily. -Neuropathy is stable, will continue to monitor  -Monitor for any clinical signs or symptoms of infection and directed to call the office immediately should any occur or go to the ER.  Trula Slade DPM

## 2022-09-21 ENCOUNTER — Inpatient Hospital Stay: Payer: PPO

## 2022-09-21 ENCOUNTER — Other Ambulatory Visit: Payer: Self-pay

## 2022-09-21 VITALS — BP 135/73 | HR 58 | Temp 98.0°F | Resp 16

## 2022-09-21 DIAGNOSIS — D469 Myelodysplastic syndrome, unspecified: Secondary | ICD-10-CM

## 2022-09-21 LAB — CBC WITH DIFFERENTIAL (CANCER CENTER ONLY)
Abs Immature Granulocytes: 0.02 10*3/uL (ref 0.00–0.07)
Basophils Absolute: 0 10*3/uL (ref 0.0–0.1)
Basophils Relative: 1 %
Eosinophils Absolute: 0.1 10*3/uL (ref 0.0–0.5)
Eosinophils Relative: 3 %
HCT: 34.4 % — ABNORMAL LOW (ref 39.0–52.0)
Hemoglobin: 10.6 g/dL — ABNORMAL LOW (ref 13.0–17.0)
Immature Granulocytes: 1 %
Lymphocytes Relative: 37 %
Lymphs Abs: 1.2 10*3/uL (ref 0.7–4.0)
MCH: 25.4 pg — ABNORMAL LOW (ref 26.0–34.0)
MCHC: 30.8 g/dL (ref 30.0–36.0)
MCV: 82.5 fL (ref 80.0–100.0)
Monocytes Absolute: 0.9 10*3/uL (ref 0.1–1.0)
Monocytes Relative: 31 %
Neutro Abs: 0.8 10*3/uL — ABNORMAL LOW (ref 1.7–7.7)
Neutrophils Relative %: 27 %
Platelet Count: 26 10*3/uL — ABNORMAL LOW (ref 150–400)
RBC: 4.17 MIL/uL — ABNORMAL LOW (ref 4.22–5.81)
RDW: 16.2 % — ABNORMAL HIGH (ref 11.5–15.5)
Smear Review: NORMAL
WBC Count: 3 10*3/uL — ABNORMAL LOW (ref 4.0–10.5)
nRBC: 0 % (ref 0.0–0.2)

## 2022-09-21 LAB — CMP (CANCER CENTER ONLY)
ALT: 5 U/L (ref 0–44)
AST: 16 U/L (ref 15–41)
Albumin: 4.1 g/dL (ref 3.5–5.0)
Alkaline Phosphatase: 55 U/L (ref 38–126)
Anion gap: 6 (ref 5–15)
BUN: 22 mg/dL (ref 8–23)
CO2: 28 mmol/L (ref 22–32)
Calcium: 8.9 mg/dL (ref 8.9–10.3)
Chloride: 108 mmol/L (ref 98–111)
Creatinine: 1.17 mg/dL (ref 0.61–1.24)
GFR, Estimated: >60 mL/min (ref >60)
Glucose, Bld: 143 mg/dL — ABNORMAL HIGH (ref 70–99)
Potassium: 3.7 mmol/L (ref 3.5–5.1)
Sodium: 142 mmol/L (ref 135–145)
Total Bilirubin: 0.8 mg/dL (ref 0.3–1.2)
Total Protein: 6.8 g/dL (ref 6.5–8.1)

## 2022-09-21 MED ORDER — EPOETIN ALFA-EPBX 20000 UNIT/ML IJ SOLN: 20000.0000 [IU] | Freq: Once | INTRAMUSCULAR | Status: DC

## 2022-09-21 MED ORDER — EPOETIN ALFA-EPBX 10000 UNIT/ML IJ SOLN
20000.0000 [IU] | Freq: Once | INTRAMUSCULAR | Status: AC
  Administered 2022-09-21: 20000 [IU] via SUBCUTANEOUS
  Filled 2022-09-21: qty 2

## 2022-09-26 ENCOUNTER — Ambulatory Visit: Payer: PPO | Admitting: Podiatry

## 2022-09-27 ENCOUNTER — Ambulatory Visit (INDEPENDENT_AMBULATORY_CARE_PROVIDER_SITE_OTHER): Payer: PPO | Admitting: Internal Medicine

## 2022-09-27 ENCOUNTER — Encounter: Payer: Self-pay | Admitting: Internal Medicine

## 2022-09-27 ENCOUNTER — Ambulatory Visit (INDEPENDENT_AMBULATORY_CARE_PROVIDER_SITE_OTHER): Payer: PPO

## 2022-09-27 VITALS — BP 118/80 | HR 67 | Temp 97.9°F | Ht 68.0 in | Wt 169.0 lb

## 2022-09-27 DIAGNOSIS — G8929 Other chronic pain: Secondary | ICD-10-CM | POA: Diagnosis not present

## 2022-09-27 DIAGNOSIS — M545 Low back pain, unspecified: Secondary | ICD-10-CM | POA: Diagnosis not present

## 2022-09-27 DIAGNOSIS — R222 Localized swelling, mass and lump, trunk: Secondary | ICD-10-CM

## 2022-09-27 DIAGNOSIS — G2581 Restless legs syndrome: Secondary | ICD-10-CM

## 2022-09-27 DIAGNOSIS — R0789 Other chest pain: Secondary | ICD-10-CM | POA: Diagnosis not present

## 2022-09-27 NOTE — Assessment & Plan Note (Addendum)
New. Posterior L posterior rib ?#11 3x2 cm NT growth. Bone callus vs other Ribs X ray May need an MRI

## 2022-09-27 NOTE — Progress Notes (Signed)
Subjective:  Patient ID: Justin Pagan Su Grand., male    DOB: 1940/07/07  Age: 82 y.o. MRN: [id number]  CC: Back Pain (Lump on lt side of lower back, pain has subsided)   HPI Justin Ogando [employer]. presents for HTN, RLS, OA C/o L rib cage mass - pt notied 1 wk ago. Pt had LS spine MRI w/Dr Nelva Bush last year On Rx for MDS  Outpatient Medications Prior to Visit  Medication Sig Dispense Refill   Acetaminophen (TYLENOL PO) Tylenol     amLODipine (NORVASC) 5 MG tablet Take 1 tablet (5 mg total) by mouth daily. 90 tablet 3   calcium carbonate (TUMS EX) 750 MG chewable tablet Chew 1 tablet by mouth daily.     carbidopa-levodopa (SINEMET IR) 25-100 MG tablet For breakthrough restless leg, you can take 1 tablet at bedtime.  OK to take extra tab as needed during the day. (Patient taking differently: Take 1 tablet by mouth at bedtime.) 100 tablet 3   Cholecalciferol 1000 UNITS tablet Take 3,000 Units by mouth daily.     Cyanocobalamin (VITAMIN B-12) 1000 MCG SUBL DISSOLVE 2 TABLETS IN MOUTH EVERY DAY 180 tablet 1   ferrous sulfate 325 (65 FE) MG tablet Take 650 mg by mouth daily with breakfast.     finasteride (PROSCAR) 5 MG tablet Take 1 tablet (5 mg total) by mouth daily. 90 tablet 1   gabapentin (NEURONTIN) 300 MG capsule Take 1 capsule (300 mg total) by mouth 4 (four) times daily. 360 capsule 1   magnesium hydroxide (MILK OF MAGNESIA) 400 MG/5ML suspension Take 15 mLs by mouth daily as needed for mild constipation.     meclizine (ANTIVERT) 12.5 MG tablet TAKE 1 TABLET BY MOUTH 3 TIMES DAILY AS NEEDED FOR DIZZINESS. 60 tablet 2   Multiple Vitamins-Minerals (PRESERVISION AREDS PO) Take 2 tablets by mouth 2 (two) times daily.     mupirocin ointment (BACTROBAN) 2 % Apply 1 Application topically 2 (two) times daily. 30 g 2   Oxycodone HCl 10 MG TABS Take 10 mg by mouth 5 (five) times daily as needed (pain). Typically only uses 4 times daily     Polyethyl Glycol-Propyl Glycol (SYSTANE OP) Place 1 drop  into both eyes 2 (two) times daily as needed (dry eyes). [facility] O[facility]     pravastatin (PRAVACHOL) 20 MG tablet Take 1 tablet (20 mg total) by mouth daily. 90 tablet 1   rOPINIRole (REQUIP) 2 MG tablet TAKE 1 TABLET BY MOUTH AT 12 PM 1 TABLET AT 4 PM AND 1 TABLETS AT BEDTIME 360 tablet 1   tamsulosin (FLOMAX) 0.4 MG CAPS capsule Take 1 capsule (0.4 mg total) by mouth in the morning. 90 capsule 1   terazosin (HYTRIN) 2 MG capsule TAKE 1 CAPSULE BY MOUTH EVERYDAY AT BEDTIME 90 capsule 1   timolol (TIMOPTIC) 0.5 % ophthalmic solution Place 1 drop into the left eye 2 (two) times daily.     No facility-administered medications prior to visit.    ROS: Review of Systems  Constitutional:  Negative for appetite change, fatigue and unexpected weight change.  HENT:  Negative for congestion, nosebleeds, sneezing, sore throat and trouble swallowing.   Eyes:  Negative for itching and visual disturbance.  Respiratory:  Negative for cough.   Cardiovascular:  Negative for chest pain, palpitations and leg swelling.  Gastrointestinal:  Negative for abdominal distention, blood in stool, diarrhea and nausea.  Genitourinary:  Negative for frequency and hematuria.  Musculoskeletal:  Positive for arthralgias, back  pain and gait problem. Negative for joint swelling and neck pain.  Skin:  Negative for rash.  Neurological:  Negative for dizziness, tremors, speech difficulty and weakness.  Psychiatric/Behavioral:  Negative for agitation, dysphoric mood, sleep disturbance and suicidal ideas. The patient is not nervous/anxious.     Objective:  BP 118/80 (BP Location: Right Arm, Patient Position: Sitting, Cuff Size: Normal)   Pulse 67   Temp 97.9 F (36.6 C) (Oral)   Ht 5\' 8"  (1.727 m)   Wt 169 lb (76.7 kg)   SpO2 98%   BMI 25.70 kg/m   BP Readings from Last 3 Encounters:  09/27/22 118/80  09/21/22 135/73  09/07/22 122/65    Wt Readings from Last 3 Encounters:  09/27/22 169 lb (76.7 kg)  08/10/22 166 lb 3.2  oz (75.4 kg)  08/07/22 170 lb (77.1 kg)    Physical Exam Constitutional:      General: He is not in acute distress.    Appearance: He is well-developed.     Comments: NAD  Eyes:     Conjunctiva/sclera: Conjunctivae normal.     Pupils: Pupils are equal, round, and reactive to light.  Neck:     Thyroid: No thyromegaly.     Vascular: No JVD.  Cardiovascular:     Rate and Rhythm: Normal rate and regular rhythm.     Heart sounds: Normal heart sounds. No murmur heard.    No friction rub. No gallop.  Pulmonary:     Effort: Pulmonary effort is normal. No respiratory distress.     Breath sounds: Normal breath sounds. No wheezing or rales.  Chest:     Chest wall: No tenderness.  Abdominal:     General: Bowel sounds are normal. There is no distension.     Palpations: Abdomen is soft. There is no mass.     Tenderness: There is no abdominal tenderness. There is no guarding or rebound.  Musculoskeletal:        General: Tenderness present. Normal range of motion.     Cervical back: Normal range of motion.     Right lower leg: No edema.     Left lower leg: No edema.  Lymphadenopathy:     Cervical: No cervical adenopathy.  Skin:    General: Skin is warm and dry.     Findings: No rash.  Neurological:     Mental Status: He is alert and oriented to person, place, and time.     Cranial Nerves: No cranial nerve deficit.     Motor: Weakness present. No abnormal muscle tone.     Coordination: Coordination abnormal.     Gait: Gait abnormal.     Deep Tendon Reflexes: Reflexes are normal and symmetric.  Psychiatric:        Behavior: Behavior normal.        Thought Content: Thought content normal.        Judgment: Judgment normal.   Using a cane Posterior L posterior rib?#113x2 cm NT growth  Lab Results  Component Value Date   WBC 3.0 (L) 09/21/2022   HGB 10.6 (L) 09/21/2022   HCT 34.4 (L) 09/21/2022   PLT 26 (L) 09/21/2022   GLUCOSE 143 (H) 09/21/2022   CHOL 136 01/12/2017   TRIG  67.0 01/12/2017   HDL 54.70 01/12/2017   LDLCALC 68 01/12/2017   ALT 5 09/21/2022   AST 16 09/21/2022   NA 142 09/21/2022   K 3.7 09/21/2022   CL 108 09/21/2022   CREATININE 1.17 09/21/2022  BUN 22 09/21/2022   CO2 28 09/21/2022   TSH 3.05 10/16/2019   PSA 0.47 08/30/2016   INR 1.1 10/28/2020   HGBA1C 5.3 04/19/2021    No results found.  Assessment & Plan:   Problem List Items Addressed This Visit       Other   Low back pain    On Oxycodone per Dr Nelva Bush  Potential benefits of a long term opioids use as well as potential risks (i.e. addiction risk, apnea etc) and complications (i.e. Somnolence, constipation and others) were explained to the patient and were aknowledged.      Mass of chest wall, left - Primary    New. Posterior L posterior rib ?#11 3x2 cm NT growth. Bone callus vs other Ribs X ray May need an MRI      Relevant Orders   DG Ribs Unilateral Left   RLS (restless legs syndrome)     Cont w/Requip, gabapentin, Sinemet         No orders of the defined types were placed in this encounter.     Follow-up: Return in about 6 months (around 03/30/2023) for a follow-up visit.  [name], MD

## 2022-09-27 NOTE — Assessment & Plan Note (Signed)
Cont w/Requip, gabapentin, Sinemet ?

## 2022-09-27 NOTE — Assessment & Plan Note (Signed)
On Oxycodone per Dr Nelva Bush  Potential benefits of a long term opioids use as well as potential risks (i.e. addiction risk, apnea etc) and complications (i.e. Somnolence, constipation and others) were explained to the patient and were aknowledged.

## 2022-10-02 ENCOUNTER — Telehealth: Payer: Self-pay | Admitting: Internal Medicine

## 2022-10-02 DIAGNOSIS — R222 Localized swelling, mass and lump, trunk: Secondary | ICD-10-CM

## 2022-10-02 NOTE — Telephone Encounter (Signed)
Patient called and said he had imaging done last week for his ribs. He was informed that they were not broken, but he would like to know what to do next. The same issue is still there and he would like a call back to figure out a solution. Best call back is [phone].

## 2022-10-03 NOTE — Telephone Encounter (Signed)
Pt came in today w/ sister. Requesting MD recommendation on his rib xray.Marland KitchenJohny [name]

## 2022-10-04 ENCOUNTER — Other Ambulatory Visit: Payer: Self-pay | Admitting: *Deleted

## 2022-10-04 MED ORDER — MECLIZINE HCL 12.5 MG PO TABS: ORAL_TABLET | ORAL | 2 refills | Status: DC

## 2022-10-04 NOTE — Telephone Encounter (Signed)
The x-ray of the ribs was nondiagnostic.  Will have to obtain an MRI of the chest with and without contrast.  I will order.  Office visit with me with the results. Thank you

## 2022-10-04 NOTE — Telephone Encounter (Signed)
Notified pt w/MD response.../lmb 

## 2022-10-05 ENCOUNTER — Other Ambulatory Visit: Payer: Self-pay

## 2022-10-05 ENCOUNTER — Inpatient Hospital Stay: Payer: PPO

## 2022-10-05 ENCOUNTER — Inpatient Hospital Stay (HOSPITAL_BASED_OUTPATIENT_CLINIC_OR_DEPARTMENT_OTHER): Payer: PPO | Admitting: Physician Assistant

## 2022-10-05 ENCOUNTER — Inpatient Hospital Stay: Payer: PPO | Attending: Physician Assistant

## 2022-10-05 ENCOUNTER — Inpatient Hospital Stay: Payer: PPO | Admitting: Hematology and Oncology

## 2022-10-05 VITALS — BP 124/67 | HR 84 | Temp 98.0°F | Resp 16 | Ht 68.0 in | Wt 170.2 lb

## 2022-10-05 DIAGNOSIS — D469 Myelodysplastic syndrome, unspecified: Secondary | ICD-10-CM

## 2022-10-05 DIAGNOSIS — Z79899 Other long term (current) drug therapy: Secondary | ICD-10-CM | POA: Insufficient documentation

## 2022-10-05 LAB — CMP (CANCER CENTER ONLY)
ALT: 6 U/L (ref 0–44)
AST: 16 U/L (ref 15–41)
Albumin: 4 g/dL (ref 3.5–5.0)
Alkaline Phosphatase: 56 U/L (ref 38–126)
Anion gap: 6 (ref 5–15)
BUN: 23 mg/dL (ref 8–23)
CO2: 26 mmol/L (ref 22–32)
Calcium: 9.2 mg/dL (ref 8.9–10.3)
Chloride: 108 mmol/L (ref 98–111)
Creatinine: 1.06 mg/dL (ref 0.61–1.24)
GFR, Estimated: >60 mL/min (ref >60)
Glucose, Bld: 94 mg/dL (ref 70–99)
Potassium: 3.9 mmol/L (ref 3.5–5.1)
Sodium: 140 mmol/L (ref 135–145)
Total Bilirubin: 0.7 mg/dL (ref 0.3–1.2)
Total Protein: 6.8 g/dL (ref 6.5–8.1)

## 2022-10-05 LAB — CBC WITH DIFFERENTIAL (CANCER CENTER ONLY)
Abs Immature Granulocytes: 0.01 10*3/uL (ref 0.00–0.07)
Basophils Absolute: 0 10*3/uL (ref 0.0–0.1)
Basophils Relative: 1 %
Eosinophils Absolute: 0.1 10*3/uL (ref 0.0–0.5)
Eosinophils Relative: 3 %
HCT: 32.7 % — ABNORMAL LOW (ref 39.0–52.0)
Hemoglobin: 10.2 g/dL — ABNORMAL LOW (ref 13.0–17.0)
Immature Granulocytes: 0 %
Lymphocytes Relative: 46 %
Lymphs Abs: 1.1 10*3/uL (ref 0.7–4.0)
MCH: 25.2 pg — ABNORMAL LOW (ref 26.0–34.0)
MCHC: 31.2 g/dL (ref 30.0–36.0)
MCV: 80.7 fL (ref 80.0–100.0)
Monocytes Absolute: 0.6 10*3/uL (ref 0.1–1.0)
Monocytes Relative: 25 %
Neutro Abs: 0.6 10*3/uL — ABNORMAL LOW (ref 1.7–7.7)
Neutrophils Relative %: 25 %
Platelet Count: 27 10*3/uL — ABNORMAL LOW (ref 150–400)
RBC: 4.05 MIL/uL — ABNORMAL LOW (ref 4.22–5.81)
RDW: 15.9 % — ABNORMAL HIGH (ref 11.5–15.5)
WBC Count: 2.4 10*3/uL — ABNORMAL LOW (ref 4.0–10.5)
nRBC: 0 % (ref 0.0–0.2)

## 2022-10-05 MED ORDER — EPOETIN ALFA-EPBX 20000 UNIT/ML IJ SOLN
20000.0000 [IU] | Freq: Once | INTRAMUSCULAR | Status: AC
  Administered 2022-10-05: 20000 [IU] via SUBCUTANEOUS
  Filled 2022-10-05: qty 1

## 2022-10-05 NOTE — Progress Notes (Signed)
[place] [phone]   [phone]  PROGRESS NOTE  Patient Care Team: Plotnikov, Evie Lacks, MD as PCP - General Elouise Munroe, MD as PCP - Cardiology (Cardiology) Carol Ada, MD as Consulting Physician (Gastroenterology) Martinique, Amy, MD as Consulting Physician (Dermatology) Alda Berthold, DO as Consulting Physician (Neurology) [name], MD as Consulting Physician (Physical Medicine and Rehabilitation) [name], MD as Consulting Physician (Orthopedic Surgery) Szabat, Darnelle Maffucci, [facility] (Inactive) as Pharmacist (Pharmacist) Orson Slick, MD as Consulting Physician (Hematology and Oncology)  Hematological/Oncological History # Pancytopenia #Myelodysplastic Syndrome, Single Lineage Dysplasia 11/18/2021: WBC 2.4, ANC 1.1, Hgb 9.7, MCV 84.0, Plt 58. 12/07/2021: Establish care with Dr. Lorenso Courier and Dede Query 12/19/2021: Bone marrow biopsy which showed a low-grade myelodysplastic syndrome.  6/28/203: Started retacrit 20,000 units q 2 weeks  Interval History:  Karry Gaebler Su Grand. 82 y.o. male with medical history significant for newly diagnosed low-grade myelodysplastic syndrome who presents for a follow up visit. The patient's last visit was on 08/10/2022. In the interim, he continues on retacrit injections q 2 weeks.   On exam today Mr. Delrossi reports that his energy levels have improved with retacrit injections but still not where he would like it to be. He does require frequent resting with doing his ADLs. He reports easy bruising but denies any signs of bleeding. His appetite and weight are stable. He denies nausea, vomiting or abdominal pain. His bowel habits are unchanged without recurrent episodes of diarrhea or constipation. He denies fevers, chills, sweats, shortness of breath, chest pain or cough. He has no other complaints.  MEDICAL HISTORY:  Past Medical History:  Diagnosis Date   Alcoholism ([place])    Dry 13 years    Basal cell carcinoma    Right Chest   BPH (benign prostatic hyperplasia)    Diverticulosis of colon    Dizzy spells    none recent   DJD (degenerative joint disease)    lower back   Elbow fracture, left 2009   [name]   GERD (gastroesophageal reflux disease)    Glaucoma    History of TIAs 1 yrs ago   Hyperkeratosis    LBP (low back pain)    Macular degeneration    left wet right dry sees [name]   Melanoma [facility]) 2009   x3 [name]   Osteoarthritis    Restless leg syndrome    Thrombocytopenia ([place]) 11/18/2021   Tinnitus    Tobacco abuse    Uses walker    Wears glasses    reading   Wears partial dentures    upper    SURGICAL HISTORY: Past Surgical History:  Procedure Laterality Date   CATARACT EXTRACTION Bilateral 2018   HARDWARE REMOVAL Left 07/09/2020   Procedure: Left elbow hardware removal;  Surgeon: [name], MD;  Location: [facility];  Service: Orthopedics;  Laterality: Left;  60 mins   mda     Dr. [name] wet injection   MELANOMA EXCISION  2009   x3   RECONSTRUCTION MEDIAL COLLATERAL LIGAMENT ELBOW W/ TENDON GRAFT  2009   Left, [name]   TONSILLECTOMY  as child   TOTAL KNEE ARTHROPLASTY Right 11/09/2020   Procedure: TOTAL KNEE ARTHROPLASTY;  Surgeon: [name], MD;  Location: [facility];  Service: Orthopedics;  Laterality: Right;    SOCIAL HISTORY: Social History   Socioeconomic History   Marital status: Married    Spouse name: Not on file  Number of children: 2   Years of education: Not on file   Highest education level: Not on file  Occupational History   Occupation: [place] Veteran - [employer]    Employer: RETIRED   Occupation: Chiropodist   Occupation: Heritage manager  Tobacco Use   Smoking status: Former    Packs/day: 3.00    Years: 38.00    Additional pack years: 0.00    Total pack years: 114.00    Types: Cigarettes    Quit date: 1986    Years since quitting: 38.2   Smokeless tobacco: Never    Tobacco comments:    states he quit May 15 1985  Vaping Use   Vaping Use: Never used  Substance and Sexual Activity   Alcohol use: No    Comment: PREVIOUS - DRY 15yrs   Drug use: Not Currently   Sexual activity: Not Currently  Other Topics Concern   Not on file  Social History Narrative   Right handed   One story home   Drinks coffee q am   Social Determinants of Health   Financial Resource Strain: Low Risk  (08/07/2022)   Overall Financial Resource Strain (CARDIA)    Difficulty of Paying Living Expenses: Not hard at all  Food Insecurity: No Food Insecurity (08/07/2022)   Hunger Vital Sign    Worried About Running Out of Food in the Last Year: Never true    [place] in the Last Year: Never true  Transportation Needs: No Transportation Needs (08/07/2022)   PRAPARE - Hydrologist (Medical): No    Lack of Transportation (Non-Medical): No  Physical Activity: Inactive (08/07/2022)   Exercise Vital Sign    Days of Exercise per Week: 0 days    Minutes of Exercise per Session: 0 min  Stress: No Stress Concern Present (08/07/2022)   [place]    Feeling of Stress : Not at all  Social Connections: Moderately Isolated (08/07/2022)   Social Connection and Isolation Panel [NHANES]    Frequency of Communication with Friends and Family: Twice a week    Frequency of Social Gatherings with Friends and Family: Twice a week    Attends Religious Services: Never    Marine scientist or Organizations: No    Attends Archivist Meetings: Never    Marital Status: Married  Human resources officer Violence: Not At Risk (08/07/2022)   Humiliation, Afraid, Rape, and Kick questionnaire    Fear of Current or Ex-Partner: No    Emotionally Abused: No    Physically Abused: No    Sexually Abused: No    FAMILY HISTORY: Family History  Problem Relation Age of Onset   Cancer Mother        ?breast    Cancer Father        Lung    ALLERGIES:  is allergic to levofloxacin.  MEDICATIONS:  Current Outpatient Medications  Medication Sig Dispense Refill   Acetaminophen (TYLENOL PO) Tylenol     amLODipine (NORVASC) 5 MG tablet Take 1 tablet (5 mg total) by mouth daily. 90 tablet 3   calcium carbonate (TUMS EX) 750 MG chewable tablet Chew 1 tablet by mouth daily.     carbidopa-levodopa (SINEMET IR) 25-100 MG tablet For breakthrough restless leg, you can take 1 tablet at bedtime.  OK to take extra tab as needed during the day. (Patient taking differently: Take 1 tablet by mouth at  bedtime.) 100 tablet 3   Cholecalciferol 1000 UNITS tablet Take 3,000 Units by mouth daily.     Cyanocobalamin (VITAMIN B-12) 1000 MCG SUBL DISSOLVE 2 TABLETS IN MOUTH EVERY DAY 180 tablet 1   ferrous sulfate 325 (65 FE) MG tablet Take 650 mg by mouth daily with breakfast.     finasteride (PROSCAR) 5 MG tablet Take 1 tablet (5 mg total) by mouth daily. 90 tablet 1   gabapentin (NEURONTIN) 300 MG capsule Take 1 capsule (300 mg total) by mouth 4 (four) times daily. 360 capsule 1   magnesium hydroxide (MILK OF MAGNESIA) 400 MG/5ML suspension Take 15 mLs by mouth daily as needed for mild constipation.     meclizine (ANTIVERT) 12.5 MG tablet TAKE 1 TABLET BY MOUTH 3 TIMES DAILY AS NEEDED FOR DIZZINESS. 90 tablet 2   Multiple Vitamins-Minerals (PRESERVISION AREDS PO) Take 2 tablets by mouth 2 (two) times daily.     mupirocin ointment (BACTROBAN) 2 % Apply 1 Application topically 2 (two) times daily. 30 g 2   Oxycodone HCl 10 MG TABS Take 10 mg by mouth 5 (five) times daily as needed (pain). Typically only uses 4 times daily     Polyethyl Glycol-Propyl Glycol (SYSTANE OP) Place 1 drop into both eyes 2 (two) times daily as needed (dry eyes). [facility] O[facility]     pravastatin (PRAVACHOL) 20 MG tablet Take 1 tablet (20 mg total) by mouth daily. 90 tablet 1   rOPINIRole (REQUIP) 2 MG tablet TAKE 1 TABLET BY MOUTH AT 12 PM 1 TABLET AT 4 PM AND  1 TABLETS AT BEDTIME 360 tablet 1   tamsulosin (FLOMAX) 0.4 MG CAPS capsule Take 1 capsule (0.4 mg total) by mouth in the morning. 90 capsule 1   terazosin (HYTRIN) 2 MG capsule TAKE 1 CAPSULE BY MOUTH EVERYDAY AT BEDTIME 90 capsule 1   timolol (TIMOPTIC) 0.5 % ophthalmic solution Place 1 drop into the left eye 2 (two) times daily.     No current facility-administered medications for this visit.    REVIEW OF SYSTEMS:   Constitutional: ( - ) fevers, ( - )  chills , ( - ) night sweats Eyes: ( - ) blurriness of vision, ( - ) double vision, ( - ) watery eyes Ears, nose, mouth, throat, and face: ( - ) mucositis, ( - ) sore throat Respiratory: ( - ) cough, ( - ) dyspnea, ( - ) wheezes Cardiovascular: ( - ) palpitation, ( - ) chest discomfort, ( - ) lower extremity swelling Gastrointestinal:  ( - ) nausea, ( - ) heartburn, ( - ) change in bowel habits Skin: ( - ) abnormal skin rashes Lymphatics: ( - ) new lymphadenopathy, ( - ) easy bruising Neurological: ( - ) numbness, ( - ) tingling, ( - ) new weaknesses Behavioral/Psych: ( - ) mood change, ( - ) new changes  All other systems were reviewed with the patient and are negative.  PHYSICAL EXAMINATION:  Vitals:   10/05/22 0934  BP: 124/67  Pulse: 84  Resp: 16  Temp: 98 F (36.7 C)  SpO2: 100%   Filed Weights   10/05/22 0934  Weight: 170 lb 3.2 oz (77.2 kg)    GENERAL: Well-appearing elderly Caucasian male, alert, no distress and comfortable SKIN: skin color, texture, turgor are normal, no rashes or significant lesions EYES: conjunctiva are pink and non-injected, sclera clear LUNGS: clear to auscultation and percussion with normal breathing effort HEART: regular rate & rhythm and no murmurs and no  lower extremity edema Musculoskeletal: no cyanosis of digits and no clubbing  PSYCH: alert & oriented x 3, fluent speech NEURO: no focal motor/sensory deficits  LABORATORY DATA:  I have reviewed the data as listed    Latest Ref Rng &  Units 10/05/2022    9:15 AM 09/21/2022    8:56 AM 09/07/2022    8:51 AM  CBC  WBC 4.0 - 10.5 K/uL 2.4  3.0  2.5   Hemoglobin 13.0 - 17.0 g/dL 10.2  10.6  10.7   Hematocrit 39.0 - 52.0 % 32.7  34.4  33.3   Platelets 150 - 400 K/uL 27  26  34        Latest Ref Rng & Units 10/05/2022    9:15 AM 09/21/2022    8:56 AM 09/07/2022    8:51 AM  CMP  Glucose 70 - 99 mg/dL 94  143  136   BUN 8 - 23 mg/dL 23  22  22    Creatinine 0.61 - 1.24 mg/dL 1.06  1.17  1.19   Sodium 135 - 145 mmol/L 140  142  141   Potassium 3.5 - 5.1 mmol/L 3.9  3.7  3.8   Chloride 98 - 111 mmol/L 108  108  108   CO2 22 - 32 mmol/L 26  28  26    Calcium 8.9 - 10.3 mg/dL 9.2  8.9  9.3   Total Protein 6.5 - 8.1 g/dL 6.8  6.8  7.1   Total Bilirubin 0.3 - 1.2 mg/dL 0.7  0.8  0.9   Alkaline Phos 38 - 126 U/L 56  55  54   AST 15 - 41 U/L 16  16  18    ALT 0 - 44 U/L 6  5  <5     Lab Results  Component Value Date   MPROTEIN Not Observed 12/07/2021   Lab Results  Component Value Date   KPAFRELGTCHN 45.3 (H) 12/07/2021   LAMBDASER 28.9 (H) 12/07/2021   KAPLAMBRATIO 1.57 12/07/2021   RADIOGRAPHIC STUDIES: DG Ribs Unilateral Left  Result Date: 09/29/2022 CLINICAL DATA:  Chest wall bump noticed last week. No injury and no pain. EXAM: LEFT RIBS - 2 VIEW COMPARISON:  Chest radiographs, 09/08/2019.  Chest CT, 07/23/2018. FINDINGS: No fracture or rib lesion. Minor linear atelectasis or scarring at the left lateral lung base. Mild prominence of the interstitial markings. Visualized lungs otherwise clear. IMPRESSION: 1. No fracture or rib lesion. 2. If further assessment of the soft tissue lump/mass is desired clinically, consider initial ultrasound, and if indicated, follow-up MRI without and with contrast. Electronically Signed   By: Lajean Manes M.D.   On: 09/29/2022 13:06    ASSESSMENT & [place]. Is a 82 y.o. male who returns for a follow up for MDS.     # Myelodysplastic Syndrome, Single Lineage  Dysplasia -- At this time Mr. Teshima has a low-grade myelodysplasia that is anemia predominant. --Given that his hemoglobin is consistently less than 10 I would recommend we proceed with erythropoietin therapy. Discussed with the patient other options including hypomethylating therapies and Luspatercept, however given that anemia is his primary issue would recommend erythropoietin to see if this helps to elevate his other blood counts as well. --No clear signs of nutritional deficiency during our prior evaluation. --MDS FISH study was normal.  --Started retacrit 20,000 units q 2 weeks on 12/28/2021.  PLAN: --Due for retacrit injection today --Labs from today reviewed. Hgb stable at 10.2, WBC stable at 2.4.  Plt trending down to 27K. Discussed with Dr. Lorenso Courier who will continue to monitor as long as platelet count is >20K. --Continue with q 2 week retacrit injections as scheduled --RTC in 6 weeks time to assure he is tolerating the shots   No orders of the defined types were placed in this encounter.   All questions were answered. The patient knows to call the clinic with any problems, questions or concerns.  A total of more than 25 minutes were spent on this encounter with face-to-face time and non-face-to-face time, including preparing to see the patient, ordering tests and/or medications, counseling the patient and coordination of care as outlined above.   Dede Query PA-C Dept of Hematology and [place] at [facility] Phone: [phone]   10/05/2022 12:05 PM

## 2022-10-09 ENCOUNTER — Other Ambulatory Visit: Payer: Self-pay | Admitting: *Deleted

## 2022-10-09 ENCOUNTER — Telehealth: Payer: Self-pay | Admitting: Internal Medicine

## 2022-10-09 MED ORDER — PRAVASTATIN SODIUM 20 MG PO TABS: 20.0000 mg | ORAL_TABLET | Freq: Every day | ORAL | 1 refills | Status: DC

## 2022-10-09 MED ORDER — FINASTERIDE 5 MG PO TABS: 5.0000 mg | ORAL_TABLET | Freq: Every day | ORAL | 1 refills | Status: DC

## 2022-10-09 NOTE — Telephone Encounter (Signed)
Patient in concerned that he hasn't heard back about getting an MRI done. He would like a call back about what he should do. Best callback is [phone].

## 2022-10-09 NOTE — Telephone Encounter (Signed)
Called pt referral was sent to [facility] imaging, and they suppose to call and schedule. Gave pt [facility] # to check status Phone: [phone] .Marland KitchenRaechel Chute

## 2022-10-11 ENCOUNTER — Other Ambulatory Visit: Payer: Self-pay | Admitting: Internal Medicine

## 2022-10-11 DIAGNOSIS — H353111 Nonexudative age-related macular degeneration, right eye, early dry stage: Secondary | ICD-10-CM | POA: Diagnosis not present

## 2022-10-11 DIAGNOSIS — H401130 Primary open-angle glaucoma, bilateral, stage unspecified: Secondary | ICD-10-CM | POA: Diagnosis not present

## 2022-10-11 DIAGNOSIS — H353221 Exudative age-related macular degeneration, left eye, with active choroidal neovascularization: Secondary | ICD-10-CM | POA: Diagnosis not present

## 2022-10-11 DIAGNOSIS — H43812 Vitreous degeneration, left eye: Secondary | ICD-10-CM | POA: Diagnosis not present

## 2022-10-19 ENCOUNTER — Inpatient Hospital Stay: Payer: PPO

## 2022-10-19 ENCOUNTER — Other Ambulatory Visit: Payer: Self-pay

## 2022-10-19 VITALS — BP 150/73 | HR 51 | Temp 97.8°F | Resp 16

## 2022-10-19 DIAGNOSIS — D469 Myelodysplastic syndrome, unspecified: Secondary | ICD-10-CM

## 2022-10-19 LAB — CBC WITH DIFFERENTIAL (CANCER CENTER ONLY)
Abs Immature Granulocytes: 0.02 10*3/uL (ref 0.00–0.07)
Basophils Absolute: 0 10*3/uL (ref 0.0–0.1)
Basophils Relative: 1 %
Eosinophils Absolute: 0 10*3/uL (ref 0.0–0.5)
Eosinophils Relative: 2 %
HCT: 31.5 % — ABNORMAL LOW (ref 39.0–52.0)
Hemoglobin: 10.1 g/dL — ABNORMAL LOW (ref 13.0–17.0)
Immature Granulocytes: 1 %
Lymphocytes Relative: 44 %
Lymphs Abs: 1 10*3/uL (ref 0.7–4.0)
MCH: 25.2 pg — ABNORMAL LOW (ref 26.0–34.0)
MCHC: 32.1 g/dL (ref 30.0–36.0)
MCV: 78.6 fL — ABNORMAL LOW (ref 80.0–100.0)
Monocytes Absolute: 0.6 10*3/uL (ref 0.1–1.0)
Monocytes Relative: 26 %
Neutro Abs: 0.6 10*3/uL — ABNORMAL LOW (ref 1.7–7.7)
Neutrophils Relative %: 26 %
Platelet Count: 24 10*3/uL — ABNORMAL LOW (ref 150–400)
RBC: 4.01 MIL/uL — ABNORMAL LOW (ref 4.22–5.81)
RDW: 15.7 % — ABNORMAL HIGH (ref 11.5–15.5)
WBC Count: 2.2 10*3/uL — ABNORMAL LOW (ref 4.0–10.5)
nRBC: 0 % (ref 0.0–0.2)

## 2022-10-19 LAB — CMP (CANCER CENTER ONLY)
ALT: 6 U/L (ref 0–44)
AST: 16 U/L (ref 15–41)
Albumin: 4.2 g/dL (ref 3.5–5.0)
Alkaline Phosphatase: 54 U/L (ref 38–126)
Anion gap: 7 (ref 5–15)
BUN: 24 mg/dL — ABNORMAL HIGH (ref 8–23)
CO2: 25 mmol/L (ref 22–32)
Calcium: 9.2 mg/dL (ref 8.9–10.3)
Chloride: 108 mmol/L (ref 98–111)
Creatinine: 1.17 mg/dL (ref 0.61–1.24)
GFR, Estimated: >60 mL/min
Glucose, Bld: 138 mg/dL — ABNORMAL HIGH (ref 70–99)
Potassium: 4 mmol/L (ref 3.5–5.1)
Sodium: 140 mmol/L (ref 135–145)
Total Bilirubin: 0.8 mg/dL (ref 0.3–1.2)
Total Protein: 6.8 g/dL (ref 6.5–8.1)

## 2022-10-19 MED ORDER — EPOETIN ALFA-EPBX 20000 UNIT/ML IJ SOLN
20000.0000 [IU] | Freq: Once | INTRAMUSCULAR | Status: AC
  Administered 2022-10-19: 20000 [IU] via SUBCUTANEOUS
  Filled 2022-10-19: qty 1

## 2022-10-28 ENCOUNTER — Ambulatory Visit
Admission: RE | Admit: 2022-10-28 | Discharge: 2022-10-28 | Disposition: A | Discharge status: Home / Self Care | Payer: PPO | Source: Ambulatory Visit | Attending: Internal Medicine | Admitting: Internal Medicine

## 2022-10-28 DIAGNOSIS — R222 Localized swelling, mass and lump, trunk: Secondary | ICD-10-CM | POA: Diagnosis not present

## 2022-10-28 MED ORDER — GADOPICLENOL 0.5 MMOL/ML IV SOLN
8.0000 mL | Freq: Once | INTRAVENOUS | Status: AC | PRN
  Administered 2022-10-28: 8 mL via INTRAVENOUS

## 2022-10-31 ENCOUNTER — Encounter: Payer: Self-pay | Admitting: Internal Medicine

## 2022-11-02 ENCOUNTER — Other Ambulatory Visit: Payer: Self-pay

## 2022-11-02 ENCOUNTER — Ambulatory Visit (INDEPENDENT_AMBULATORY_CARE_PROVIDER_SITE_OTHER): Payer: PPO | Admitting: Internal Medicine

## 2022-11-02 ENCOUNTER — Inpatient Hospital Stay: Payer: PPO

## 2022-11-02 ENCOUNTER — Encounter: Payer: Self-pay | Admitting: Internal Medicine

## 2022-11-02 ENCOUNTER — Inpatient Hospital Stay: Payer: PPO | Attending: Physician Assistant

## 2022-11-02 VITALS — BP 135/77 | HR 65 | Temp 97.8°F | Resp 17

## 2022-11-02 VITALS — BP 118/82 | HR 57 | Temp 97.9°F | Ht 68.0 in | Wt 170.0 lb

## 2022-11-02 DIAGNOSIS — L02211 Cutaneous abscess of abdominal wall: Secondary | ICD-10-CM | POA: Insufficient documentation

## 2022-11-02 DIAGNOSIS — D469 Myelodysplastic syndrome, unspecified: Secondary | ICD-10-CM

## 2022-11-02 DIAGNOSIS — S2249XA Multiple fractures of ribs, unspecified side, initial encounter for closed fracture: Secondary | ICD-10-CM | POA: Insufficient documentation

## 2022-11-02 DIAGNOSIS — S2242XD Multiple fractures of ribs, left side, subsequent encounter for fracture with routine healing: Secondary | ICD-10-CM

## 2022-11-02 LAB — CBC WITH DIFFERENTIAL (CANCER CENTER ONLY)
Abs Immature Granulocytes: 0.01 10*3/uL (ref 0.00–0.07)
Basophils Absolute: 0 10*3/uL (ref 0.0–0.1)
Basophils Relative: 1 %
Eosinophils Absolute: 0 10*3/uL (ref 0.0–0.5)
Eosinophils Relative: 1 %
HCT: 33.6 % — ABNORMAL LOW (ref 39.0–52.0)
Hemoglobin: 10.4 g/dL — ABNORMAL LOW (ref 13.0–17.0)
Immature Granulocytes: 0 %
Lymphocytes Relative: 28 %
Lymphs Abs: 1 10*3/uL (ref 0.7–4.0)
MCH: 24.8 pg — ABNORMAL LOW (ref 26.0–34.0)
MCHC: 31 g/dL (ref 30.0–36.0)
MCV: 80.2 fL (ref 80.0–100.0)
Monocytes Absolute: 1.1 10*3/uL — ABNORMAL HIGH (ref 0.1–1.0)
Monocytes Relative: 32 %
Neutro Abs: 1.3 10*3/uL — ABNORMAL LOW (ref 1.7–7.7)
Neutrophils Relative %: 38 %
Platelet Count: 28 10*3/uL — ABNORMAL LOW (ref 150–400)
RBC: 4.19 MIL/uL — ABNORMAL LOW (ref 4.22–5.81)
RDW: 16.1 % — ABNORMAL HIGH (ref 11.5–15.5)
WBC Count: 3.4 10*3/uL — ABNORMAL LOW (ref 4.0–10.5)
nRBC: 0 % (ref 0.0–0.2)

## 2022-11-02 LAB — CMP (CANCER CENTER ONLY)
ALT: <5 U/L (ref 0–44)
AST: 15 U/L (ref 15–41)
Albumin: 4.2 g/dL (ref 3.5–5.0)
Alkaline Phosphatase: 58 U/L (ref 38–126)
Anion gap: 5 (ref 5–15)
BUN: 27 mg/dL — ABNORMAL HIGH (ref 8–23)
CO2: 28 mmol/L (ref 22–32)
Calcium: 8.9 mg/dL (ref 8.9–10.3)
Chloride: 108 mmol/L (ref 98–111)
Creatinine: 1.15 mg/dL (ref 0.61–1.24)
GFR, Estimated: >60 mL/min (ref >60)
Glucose, Bld: 111 mg/dL — ABNORMAL HIGH (ref 70–99)
Potassium: 4.1 mmol/L (ref 3.5–5.1)
Sodium: 141 mmol/L (ref 135–145)
Total Bilirubin: 0.7 mg/dL (ref 0.3–1.2)
Total Protein: 6.8 g/dL (ref 6.5–8.1)

## 2022-11-02 MED ORDER — EPOETIN ALFA-EPBX 20000 UNIT/ML IJ SOLN
20000.0000 [IU] | Freq: Once | INTRAMUSCULAR | Status: AC
  Administered 2022-11-02: 20000 [IU] via SUBCUTANEOUS
  Filled 2022-11-02: qty 1

## 2022-11-02 MED ORDER — DOXYCYCLINE HYCLATE 100 MG PO TABS
100.0000 mg | ORAL_TABLET | Freq: Two times a day (BID) | ORAL | 0 refills | Status: DC
Start: 2022-11-02 — End: 2022-11-21

## 2022-11-02 NOTE — Assessment & Plan Note (Signed)
See procedure Doxy x 10 d

## 2022-11-02 NOTE — Assessment & Plan Note (Addendum)
L side - MRI confirmed. No cancer. On Vit D

## 2022-11-02 NOTE — Patient Instructions (Signed)
   Wound instructions : change dressing once a day or twice a day if needed. Change dressing after  shower in the morning.  Pat dry the wound with gauze. Removed half of the packing material in 1 day.  Make sure no packing is left behind.  Dress the wound with antibiotic ointment and cover with Telfa or a Band-Aid of appropriate size.    Please contact us if you notice a recollection of pus in the abscess fever and chills increased pain redness red streaks near the abscess increased swelling in the area

## 2022-11-02 NOTE — Progress Notes (Signed)
Subjective:  Patient ID: Justin [name] Justin Shiley., male    DOB: 10/29/1940  Age: 82 y.o. MRN: [id number]  CC: No chief complaint on file.   HPI Justin [name]. presents for a painful boil on the abdominal wall x 2 days F/u on the MRI  Outpatient Medications Prior to Visit  Medication Sig Dispense Refill   Acetaminophen (TYLENOL PO) Tylenol     amLODipine (NORVASC) 5 MG tablet Take 1 tablet (5 mg total) by mouth daily. 90 tablet 3   calcium carbonate (TUMS EX) 750 MG chewable tablet Chew 1 tablet by mouth daily.     carbidopa-levodopa (SINEMET IR) 25-100 MG tablet For breakthrough restless leg, you can take 1 tablet at bedtime.  OK to take extra tab as needed during the day. (Patient taking differently: Take 1 tablet by mouth at bedtime.) 100 tablet 3   Cholecalciferol 1000 UNITS tablet Take 3,000 Units by mouth daily.     Cyanocobalamin (VITAMIN B-12) 1000 MCG SUBL DISSOLVE 2 TABLETS IN MOUTH EVERY DAY 180 tablet 1   ferrous sulfate 325 (65 FE) MG tablet Take 650 mg by mouth daily with breakfast.     finasteride (PROSCAR) 5 MG tablet Take 1 tablet (5 mg total) by mouth daily. 90 tablet 1   gabapentin (NEURONTIN) 300 MG capsule Take 1 capsule (300 mg total) by mouth 4 (four) times daily. 360 capsule 1   magnesium hydroxide (MILK OF MAGNESIA) 400 MG/5ML suspension Take 15 mLs by mouth daily as needed for mild constipation.     meclizine (ANTIVERT) 12.5 MG tablet TAKE 1 TABLET BY MOUTH 3 TIMES DAILY AS NEEDED FOR DIZZINESS. 90 tablet 2   Multiple Vitamins-Minerals (PRESERVISION AREDS PO) Take 2 tablets by mouth 2 (two) times daily.     mupirocin ointment (BACTROBAN) 2 % Apply 1 Application topically 2 (two) times daily. 30 g 2   Oxycodone HCl 10 MG TABS Take 10 mg by mouth 5 (five) times daily as needed (pain). Typically only uses 4 times daily     Polyethyl Glycol-Propyl Glycol (SYSTANE OP) Place 1 drop into both eyes 2 (two) times daily as needed (dry eyes). [facility]     pravastatin  (PRAVACHOL) 20 MG tablet Take 1 tablet (20 mg total) by mouth daily. 90 tablet 1   rOPINIRole (REQUIP) 2 MG tablet TAKE 1 TABLET BY MOUTH AT AT NOON AND 1 TAB AT 4PM AND 1 TAB AT BEDTIME 270 tablet 1   tamsulosin (FLOMAX) 0.4 MG CAPS capsule Take 1 capsule (0.4 mg total) by mouth in the morning. 90 capsule 1   terazosin (HYTRIN) 2 MG capsule TAKE 1 CAPSULE BY MOUTH EVERYDAY AT BEDTIME 90 capsule 1   timolol (TIMOPTIC) 0.5 % ophthalmic solution Place 1 drop into the left eye 2 (two) times daily.     No facility-administered medications prior to visit.    ROS: Review of Systems  Constitutional:  Negative for appetite change, fatigue and unexpected weight change.  HENT:  Negative for congestion, nosebleeds, sneezing, sore throat and trouble swallowing.   Eyes:  Negative for itching and visual disturbance.  Respiratory:  Negative for cough.   Cardiovascular:  Negative for chest pain, palpitations and leg swelling.  Gastrointestinal:  Negative for abdominal distention, blood in stool, diarrhea and nausea.  Genitourinary:  Negative for frequency and hematuria.  Musculoskeletal:  Positive for back pain and gait problem. Negative for joint swelling and neck pain.  Skin:  Positive for color change. Negative for rash.  Neurological:  Negative for dizziness, tremors, speech difficulty and weakness.  Psychiatric/Behavioral:  Negative for agitation, dysphoric mood, sleep disturbance and suicidal ideas. The patient is not nervous/anxious.     Objective:  BP 118/82 (BP Location: Right Arm, Patient Position: Sitting, Cuff Size: Normal)   Pulse (!) 57   Temp 97.9 F (36.6 C) (Oral)   Ht 5\' 8"  (1.727 m)   Wt 170 lb (77.1 kg)   SpO2 96%   BMI 25.85 kg/m   BP Readings from Last 3 Encounters:  11/02/22 118/82  11/02/22 135/77  10/19/22 (!) 150/73    Wt Readings from Last 3 Encounters:  11/02/22 170 lb (77.1 kg)  10/05/22 170 lb 3.2 oz (77.2 kg)  09/27/22 169 lb (76.7 kg)    Physical  Exam Constitutional:      General: He is not in acute distress.    Appearance: He is well-developed. He is obese.     Comments: NAD  Eyes:     Conjunctiva/sclera: Conjunctivae normal.     Pupils: Pupils are equal, round, and reactive to light.  Neck:     Thyroid: No thyromegaly.     Vascular: No JVD.  Cardiovascular:     Rate and Rhythm: Normal rate and regular rhythm.     Heart sounds: Normal heart sounds. No murmur heard.    No friction rub. No gallop.  Pulmonary:     Effort: Pulmonary effort is normal. No respiratory distress.     Breath sounds: Normal breath sounds. No wheezing or rales.  Chest:     Chest wall: No tenderness.  Abdominal:     General: Bowel sounds are normal. There is no distension.     Palpations: Abdomen is soft. There is no mass.     Tenderness: There is no abdominal tenderness. There is no guarding or rebound.  Musculoskeletal:        General: Tenderness present. Normal range of motion.     Cervical back: Normal range of motion.  Lymphadenopathy:     Cervical: No cervical adenopathy.  Skin:    General: Skin is warm and dry.     Findings: No rash.  Neurological:     Mental Status: He is alert and oriented to person, place, and time.     Cranial Nerves: No cranial nerve deficit.     Motor: No abnormal muscle tone.     Coordination: Coordination normal.     Gait: Gait normal.     Deep Tendon Reflexes: Reflexes are normal and symmetric.  Psychiatric:        Behavior: Behavior normal.        Thought Content: Thought content normal.        Judgment: Judgment normal.   R abd with 3.0x1.5 cm abscess  Procedure note:  Incision and Drainage of an Abscess, complex   Indication : a localized collection of pus or blood that is tender and not spontaneously resolving.    Risks including unsuccessful procedure , possible need for a repeat procedure due to pus accumulation, scar formation, and others as well as benefits were explained to the patient in  detail.  Verbal consent was obtained   The patient was placed in a decubitus position. The area of an abscess was prepped with povidone-iodine and draped in a sterile fashion. Local anesthesia was administered with 2 cc of lidocaine/epinephrine.  1.0 cm incision with #11 strait blade was made. About 0.5 cc of pus-like material was expressed.  The cavity was explored  and internal septae were bluntly destroyed with a forceps.  About 1/2 inches of iodoform gauze packing material was inserted in the cavity.  The wound was irrigated with the leftovers of the anesthetic, dressed with antibiotic ointment and a Band-Aid.  Tolerated well. Complications: None.   Wound instructions provided.   Wound instructions : change dressing once a day or twice a day if needed. Change dressing after  shower in the morning.  Pat dry the wound with gauze. Removed half of the packing material in 2 days.  Remove another half and 4-5 days.  Make sure no packing is left behind.  Dress the wound with antibiotic ointment and cover with Telfa or a Band-Aid of appropriate size.    Please contact us if you notice a recollection of pus in the abscess fever and chills increased pain redness red streaks near the abscess increased swelling in the area    Lab Results  Component Value Date   WBC 3.4 (L) 11/02/2022   HGB 10.4 (L) 11/02/2022   HCT 33.6 (L) 11/02/2022   PLT 28 (L) 11/02/2022   GLUCOSE 111 (H) 11/02/2022   CHOL 136 01/12/2017   TRIG 67.0 01/12/2017   HDL 54.70 01/12/2017   LDLCALC 68 01/12/2017   ALT <5 11/02/2022   AST 15 11/02/2022   NA 141 11/02/2022   K 4.1 11/02/2022   CL 108 11/02/2022   CREATININE 1.15 11/02/2022   BUN 27 (H) 11/02/2022   CO2 28 11/02/2022   TSH 3.05 10/16/2019   PSA 0.47 08/30/2016   INR 1.1 10/28/2020   HGBA1C 5.3 04/19/2021    MR CHEST W WO CONTRAST  Result Date: 11/01/2022 CLINICAL DATA:  Chest wall mass, ultrasound/x-ray nondiagnostic. Patient complains of a mass involving the  posterior left chest for months. EXAM: MR CHEST WITH AND WITHOUT CONTRAST TECHNIQUE: Multiplanar, multisequence MR imaging of the chest was performed before and after the administration of intravenous contrast. CONTRAST:  8 cc Vueway COMPARISON:  Chest radiographs 09/27/2022. Cardiac CT 07/23/2018. No ultrasound available. FINDINGS: Despite efforts by the technologist and patient, mild motion artifact is present on today's exam and could not be eliminated. This reduces exam sensitivity and specificity. Examination is targeted to the posterior aspect of the left chest at the patient's palpable concern. Capsules were placed around the palpable concern. The entire chest was not imaged. Bones/Joint/Cartilage: There are probable healing posterior left rib fractures. These fractures appear to involve the posterior aspects of the left 10th and 11th ribs, and possibly the 12th rib. The 10th rib fracture is associated with marrow and surrounding soft tissue edema as well as enhancement. The 11th rib fracture demonstrates only minimal edema. No associated chest wall mass or fluid collection identified. There is a moderate convex left thoracolumbar scoliosis. Ligaments: No significant ligamentous abnormalities. Muscles and Tendons: Mild edema and enhancement in the soft tissue surrounding the apparent left 10th rib fracture. No other focal muscular abnormalities are identified. Soft tissue: No soft tissue masses or fluid collections are seen posteriorly in the left chest wall. No suspicious findings in the visualized upper abdomen. There are small renal cysts bilaterally for which no follow-up imaging is recommended. Trace left pleural effusion. IMPRESSION: 1. Probable healing posterior left rib fractures involving the left 10th-12th ribs, presumably accounting for the patient's palpable concern. The 10th rib fracture is associated with marrow and surrounding soft tissue edema and enhancement. These could be confirmed with  follow-up radiographs if in consistent with the clinical impression. 2. No  associated chest wall mass or fluid collection identified. 3. Underlying moderate convex left thoracolumbar scoliosis. Electronically Signed   By: Carey Bullocks M.D.   On: 11/01/2022 14:26    Assessment & Plan:   Problem List Items Addressed This Visit     Abscess of skin of abdomen [L02.211] - Primary    See procedure Doxy x 10 d      Rib fractures    L side - MRI confirmed On Vit D         Meds ordered this encounter  Medications   doxycycline (VIBRA-TABS) 100 MG tablet    Sig: Take 1 tablet (100 mg total) by mouth 2 (two) times daily.    Dispense:  20 tablet    Refill:  0      Follow-up: No follow-ups on file.  Sonda Primes, MD

## 2022-11-16 ENCOUNTER — Inpatient Hospital Stay: Payer: PPO

## 2022-11-16 ENCOUNTER — Other Ambulatory Visit: Payer: Self-pay

## 2022-11-16 VITALS — BP 147/74 | HR 56 | Temp 98.2°F | Resp 16

## 2022-11-16 DIAGNOSIS — D469 Myelodysplastic syndrome, unspecified: Secondary | ICD-10-CM

## 2022-11-16 LAB — CBC WITH DIFFERENTIAL (CANCER CENTER ONLY)
Abs Immature Granulocytes: 0.02 10*3/uL (ref 0.00–0.07)
Basophils Absolute: 0 10*3/uL (ref 0.0–0.1)
Basophils Relative: 1 %
Eosinophils Absolute: 0 10*3/uL (ref 0.0–0.5)
Eosinophils Relative: 1 %
HCT: 34.6 % — ABNORMAL LOW (ref 39.0–52.0)
Hemoglobin: 10.8 g/dL — ABNORMAL LOW (ref 13.0–17.0)
Immature Granulocytes: 1 %
Lymphocytes Relative: 30 %
Lymphs Abs: 1.3 10*3/uL (ref 0.7–4.0)
MCH: 24.6 pg — ABNORMAL LOW (ref 26.0–34.0)
MCHC: 31.2 g/dL (ref 30.0–36.0)
MCV: 78.8 fL — ABNORMAL LOW (ref 80.0–100.0)
Monocytes Absolute: 1 10*3/uL (ref 0.1–1.0)
Monocytes Relative: 23 %
Neutro Abs: 1.9 10*3/uL (ref 1.7–7.7)
Neutrophils Relative %: 44 %
Platelet Count: 32 10*3/uL — ABNORMAL LOW (ref 150–400)
RBC: 4.39 MIL/uL (ref 4.22–5.81)
RDW: 16.4 % — ABNORMAL HIGH (ref 11.5–15.5)
WBC Count: 4.2 10*3/uL (ref 4.0–10.5)
nRBC: 0 % (ref 0.0–0.2)

## 2022-11-16 LAB — CMP (CANCER CENTER ONLY)
ALT: 5 U/L (ref 0–44)
AST: 18 U/L (ref 15–41)
Albumin: 4.2 g/dL (ref 3.5–5.0)
Alkaline Phosphatase: 54 U/L (ref 38–126)
Anion gap: 6 (ref 5–15)
BUN: 26 mg/dL — ABNORMAL HIGH (ref 8–23)
CO2: 27 mmol/L (ref 22–32)
Calcium: 9 mg/dL (ref 8.9–10.3)
Chloride: 107 mmol/L (ref 98–111)
Creatinine: 1.16 mg/dL (ref 0.61–1.24)
GFR, Estimated: >60 mL/min (ref >60)
Glucose, Bld: 129 mg/dL — ABNORMAL HIGH (ref 70–99)
Potassium: 4.1 mmol/L (ref 3.5–5.1)
Sodium: 140 mmol/L (ref 135–145)
Total Bilirubin: 0.8 mg/dL (ref 0.3–1.2)
Total Protein: 6.9 g/dL (ref 6.5–8.1)

## 2022-11-16 MED ORDER — EPOETIN ALFA-EPBX 20000 UNIT/ML IJ SOLN
20000.0000 [IU] | Freq: Once | INTRAMUSCULAR | Status: AC
  Administered 2022-11-16: 20000 [IU] via SUBCUTANEOUS
  Filled 2022-11-16: qty 1

## 2022-11-16 NOTE — Patient Instructions (Signed)

## 2022-11-21 ENCOUNTER — Encounter: Payer: Self-pay | Admitting: Internal Medicine

## 2022-11-21 ENCOUNTER — Ambulatory Visit (INDEPENDENT_AMBULATORY_CARE_PROVIDER_SITE_OTHER): Payer: PPO | Admitting: Internal Medicine

## 2022-11-21 VITALS — BP 118/70 | HR 60 | Temp 98.2°F | Ht 68.0 in | Wt 166.0 lb

## 2022-11-21 DIAGNOSIS — L0292 Furuncle, unspecified: Secondary | ICD-10-CM

## 2022-11-21 DIAGNOSIS — N401 Enlarged prostate with lower urinary tract symptoms: Secondary | ICD-10-CM | POA: Diagnosis not present

## 2022-11-21 MED ORDER — TAMSULOSIN HCL 0.4 MG PO CAPS: 0.4000 mg | ORAL_CAPSULE | Freq: Every morning | ORAL | 1 refills | Status: DC

## 2022-11-21 MED ORDER — DOXYCYCLINE HYCLATE 100 MG PO TABS: 100.0000 mg | ORAL_TABLET | Freq: Two times a day (BID) | ORAL | 0 refills | Status: DC

## 2022-11-21 NOTE — Addendum Note (Signed)
Addended by: Tresa Garter on: 11/21/2022 11:51 AM   Modules accepted: Level of Service

## 2022-11-21 NOTE — Assessment & Plan Note (Signed)
Better No fluctuation or d/c. No need for I&D Renew Doxy x 10 days

## 2022-11-21 NOTE — Progress Notes (Addendum)
Subjective:  Patient ID: Gershon Crane [name], male    DOB: 01/06/1941  Age: 82 y.o. MRN: [id number]  CC: Follow-up (6 mnth f/u)   HPI Phillippe Raymo [employer]. presents for a abd wall boil - better F/u on BPH  Outpatient Medications Prior to Visit  Medication Sig Dispense Refill   Acetaminophen (TYLENOL PO) Tylenol     amLODipine (NORVASC) 5 MG tablet Take 1 tablet (5 mg total) by mouth daily. 90 tablet 3   calcium carbonate (TUMS EX) 750 MG chewable tablet Chew 1 tablet by mouth daily.     carbidopa-levodopa (SINEMET IR) 25-100 MG tablet For breakthrough restless leg, you can take 1 tablet at bedtime.  OK to take extra tab as needed during the day. (Patient taking differently: Take 1 tablet by mouth at bedtime.) 100 tablet 3   Cholecalciferol 1000 UNITS tablet Take 3,000 Units by mouth daily.     Cyanocobalamin (VITAMIN B-12) 1000 MCG SUBL DISSOLVE 2 TABLETS IN MOUTH EVERY DAY 180 tablet 1   ferrous sulfate 325 (65 FE) MG tablet Take 650 mg by mouth daily with breakfast.     finasteride (PROSCAR) 5 MG tablet Take 1 tablet (5 mg total) by mouth daily. 90 tablet 1   gabapentin (NEURONTIN) 300 MG capsule Take 1 capsule (300 mg total) by mouth 4 (four) times daily. 360 capsule 1   magnesium hydroxide (MILK OF MAGNESIA) 400 MG/5ML suspension Take 15 mLs by mouth daily as needed for mild constipation.     meclizine (ANTIVERT) 12.5 MG tablet TAKE 1 TABLET BY MOUTH 3 TIMES DAILY AS NEEDED FOR DIZZINESS. 90 tablet 2   Multiple Vitamins-Minerals (PRESERVISION AREDS PO) Take 2 tablets by mouth 2 (two) times daily.     mupirocin ointment (BACTROBAN) 2 % Apply 1 Application topically 2 (two) times daily. 30 g 2   Oxycodone HCl 10 MG TABS Take 10 mg by mouth 5 (five) times daily as needed (pain). Typically only uses 4 times daily     Polyethyl Glycol-Propyl Glycol (SYSTANE OP) Place 1 drop into both eyes 2 (two) times daily as needed (dry eyes). [facility]     pravastatin (PRAVACHOL) 20 MG tablet Take 1  tablet (20 mg total) by mouth daily. 90 tablet 1   rOPINIRole (REQUIP) 2 MG tablet TAKE 1 TABLET BY MOUTH AT AT NOON AND 1 TAB AT 4PM AND 1 TAB AT BEDTIME 270 tablet 1   terazosin (HYTRIN) 2 MG capsule TAKE 1 CAPSULE BY MOUTH EVERYDAY AT BEDTIME 90 capsule 1   timolol (TIMOPTIC) 0.5 % ophthalmic solution Place 1 drop into the left eye 2 (two) times daily.     tamsulosin (FLOMAX) 0.4 MG CAPS capsule Take 1 capsule (0.4 mg total) by mouth in the morning. 90 capsule 1   doxycycline (VIBRA-TABS) 100 MG tablet Take 1 tablet (100 mg total) by mouth 2 (two) times daily. (Patient not taking: Reported on 11/21/2022) 20 tablet 0   No facility-administered medications prior to visit.    ROS: Review of Systems  Constitutional:  Positive for fatigue.  Gastrointestinal:  Negative for abdominal pain and nausea.  Genitourinary:  Positive for difficulty urinating and frequency.  Musculoskeletal:  Positive for arthralgias and gait problem.  Skin:  Positive for color change and wound.    Objective:  BP 118/70 (BP Location: Right Arm, Patient Position: Sitting, Cuff Size: Large)   Pulse 60   Temp 98.2 F (36.8 C) (Oral)   Ht 5\' 8"  (1.727 m)  Wt 166 lb (75.3 kg)   SpO2 96%   BMI 25.24 kg/m   BP Readings from Last 3 Encounters:  11/21/22 118/70  11/16/22 (!) 147/74  11/02/22 118/82    Wt Readings from Last 3 Encounters:  11/21/22 166 lb (75.3 kg)  11/02/22 170 lb (77.1 kg)  10/05/22 170 lb 3.2 oz (77.2 kg)    Physical Exam Constitutional:      General: He is not in acute distress.    Appearance: He is well-developed.     Comments: NAD  Eyes:     Conjunctiva/sclera: Conjunctivae normal.     Pupils: Pupils are equal, round, and reactive to light.  Neck:     Thyroid: No thyromegaly.     Vascular: No JVD.  Cardiovascular:     Rate and Rhythm: Normal rate and regular rhythm.     Heart sounds: Normal heart sounds. No murmur heard.    No friction rub. No gallop.  Pulmonary:     Effort:  Pulmonary effort is normal. No respiratory distress.     Breath sounds: Normal breath sounds. No wheezing or rales.  Chest:     Chest wall: No tenderness.  Abdominal:     General: Bowel sounds are normal. There is no distension.     Palpations: Abdomen is soft. There is no mass.     Tenderness: There is no abdominal tenderness. There is no guarding or rebound.  Musculoskeletal:        General: No tenderness. Normal range of motion.     Cervical back: Normal range of motion.  Lymphadenopathy:     Cervical: No cervical adenopathy.  Skin:    General: Skin is warm and dry.     Findings: No rash.  Neurological:     Mental Status: He is alert and oriented to person, place, and time.     Cranial Nerves: No cranial nerve deficit.     Motor: No abnormal muscle tone.     Coordination: Coordination normal.     Gait: Gait normal.     Deep Tendon Reflexes: Reflexes are normal and symmetric.  Psychiatric:        Behavior: Behavior normal.        Thought Content: Thought content normal.        Judgment: Judgment normal.   1x1 cm sq nodule on the R abdomen 2 mm wound w/fibrin  No d/c  Lab Results  Component Value Date   WBC 4.2 11/16/2022   HGB 10.8 (L) 11/16/2022   HCT 34.6 (L) 11/16/2022   PLT 32 (L) 11/16/2022   GLUCOSE 129 (H) 11/16/2022   CHOL 136 01/12/2017   TRIG 67.0 01/12/2017   HDL 54.70 01/12/2017   LDLCALC 68 01/12/2017   ALT 5 11/16/2022   AST 18 11/16/2022   NA 140 11/16/2022   K 4.1 11/16/2022   CL 107 11/16/2022   CREATININE 1.16 11/16/2022   BUN 26 (H) 11/16/2022   CO2 27 11/16/2022   TSH 3.05 10/16/2019   PSA 0.47 08/30/2016   INR 1.1 10/28/2020   HGBA1C 5.3 04/19/2021    MR CHEST W WO CONTRAST  Result Date: 11/01/2022 CLINICAL DATA:  Chest wall mass, ultrasound/x-ray nondiagnostic. Patient complains of a mass involving the posterior left chest for months. EXAM: MR CHEST WITH AND WITHOUT CONTRAST TECHNIQUE: Multiplanar, multisequence MR imaging of the chest  was performed before and after the administration of intravenous contrast. CONTRAST:  8 cc Vueway COMPARISON:  Chest radiographs 09/27/2022. Cardiac CT  07/23/2018. No ultrasound available. FINDINGS: Despite efforts by the technologist and patient, mild motion artifact is present on today's exam and could not be eliminated. This reduces exam sensitivity and specificity. Examination is targeted to the posterior aspect of the left chest at the patient's palpable concern. Capsules were placed around the palpable concern. The entire chest was not imaged. Bones/Joint/Cartilage: There are probable healing posterior left rib fractures. These fractures appear to involve the posterior aspects of the left 10th and 11th ribs, and possibly the 12th rib. The 10th rib fracture is associated with marrow and surrounding soft tissue edema as well as enhancement. The 11th rib fracture demonstrates only minimal edema. No associated chest wall mass or fluid collection identified. There is a moderate convex left thoracolumbar scoliosis. Ligaments: No significant ligamentous abnormalities. Muscles and Tendons: Mild edema and enhancement in the soft tissue surrounding the apparent left 10th rib fracture. No other focal muscular abnormalities are identified. Soft tissue: No soft tissue masses or fluid collections are seen posteriorly in the left chest wall. No suspicious findings in the visualized upper abdomen. There are small renal cysts bilaterally for which no follow-up imaging is recommended. Trace left pleural effusion. IMPRESSION: 1. Probable healing posterior left rib fractures involving the left 10th-12th ribs, presumably accounting for the patient's palpable concern. The 10th rib fracture is associated with marrow and surrounding soft tissue edema and enhancement. These could be confirmed with follow-up radiographs if in consistent with the clinical impression. 2. No associated chest wall mass or fluid collection identified. 3.  Underlying moderate convex left thoracolumbar scoliosis. Electronically Signed   By: Carey Bullocks M.D.   On: 11/01/2022 14:26    Assessment & Plan:   Problem List Items Addressed This Visit     BPH (benign prostatic hyperplasia)    On Tamsulosin - see Rx      Relevant Medications   tamsulosin (FLOMAX) 0.4 MG CAPS capsule   Boil - Primary    Better No fluctuation or d/c. No need for I&D Renew Doxy x 10 days          Meds ordered this encounter  Medications   doxycycline (VIBRA-TABS) 100 MG tablet    Sig: Take 1 tablet (100 mg total) by mouth 2 (two) times daily.    Dispense:  20 tablet    Refill:  0   tamsulosin (FLOMAX) 0.4 MG CAPS capsule    Sig: Take 1 capsule (0.4 mg total) by mouth in the morning.    Dispense:  90 capsule    Refill:  1      Follow-up: No follow-ups on file.  Sonda Primes, MD

## 2022-11-21 NOTE — Assessment & Plan Note (Signed)
On Tamsulosin - see Rx

## 2022-11-22 ENCOUNTER — Other Ambulatory Visit: Payer: Self-pay | Admitting: *Deleted

## 2022-11-22 NOTE — Telephone Encounter (Signed)
Re'd fax pt requesting refill on flomax. Med was refilled yesterday,,,,/lmb

## 2022-11-29 ENCOUNTER — Telehealth: Payer: Self-pay | Admitting: *Deleted

## 2022-11-29 NOTE — Telephone Encounter (Signed)
Received call from pt. He states he tested  for Covid on 11/24/22. He is scheduled for labs/MD and an injection tomorrow. Advised thatwe will need to reschedule his appt for 10 days after he tested +. Advised that he has been rescheduled to 12/08/22 @ 12:45 pm. Advised that he will need to wear a mask when he arrives at the [facility]. Advised that his other appts have been rescheduled as well as he comes every 2 weeks for labs and Epo injections. Pt voiced understanding.

## 2022-11-30 ENCOUNTER — Inpatient Hospital Stay: Payer: PPO

## 2022-11-30 ENCOUNTER — Inpatient Hospital Stay: Payer: PPO | Admitting: Hematology and Oncology

## 2022-12-05 ENCOUNTER — Ambulatory Visit: Payer: PPO | Admitting: Podiatry

## 2022-12-05 DIAGNOSIS — M79674 Pain in right toe(s): Secondary | ICD-10-CM

## 2022-12-05 DIAGNOSIS — M79675 Pain in left toe(s): Secondary | ICD-10-CM | POA: Diagnosis not present

## 2022-12-05 DIAGNOSIS — Q828 Other specified congenital malformations of skin: Secondary | ICD-10-CM

## 2022-12-05 DIAGNOSIS — B351 Tinea unguium: Secondary | ICD-10-CM | POA: Diagnosis not present

## 2022-12-05 DIAGNOSIS — G629 Polyneuropathy, unspecified: Secondary | ICD-10-CM

## 2022-12-06 DIAGNOSIS — Z961 Presence of intraocular lens: Secondary | ICD-10-CM | POA: Diagnosis not present

## 2022-12-06 DIAGNOSIS — H43812 Vitreous degeneration, left eye: Secondary | ICD-10-CM | POA: Diagnosis not present

## 2022-12-06 DIAGNOSIS — H353221 Exudative age-related macular degeneration, left eye, with active choroidal neovascularization: Secondary | ICD-10-CM | POA: Diagnosis not present

## 2022-12-06 DIAGNOSIS — H401130 Primary open-angle glaucoma, bilateral, stage unspecified: Secondary | ICD-10-CM | POA: Diagnosis not present

## 2022-12-06 DIAGNOSIS — H353111 Nonexudative age-related macular degeneration, right eye, early dry stage: Secondary | ICD-10-CM | POA: Diagnosis not present

## 2022-12-08 ENCOUNTER — Inpatient Hospital Stay (HOSPITAL_BASED_OUTPATIENT_CLINIC_OR_DEPARTMENT_OTHER): Payer: PPO | Admitting: Hematology and Oncology

## 2022-12-08 ENCOUNTER — Inpatient Hospital Stay: Payer: PPO | Attending: Physician Assistant

## 2022-12-08 ENCOUNTER — Inpatient Hospital Stay: Payer: PPO

## 2022-12-08 VITALS — BP 153/79 | HR 51 | Temp 97.7°F | Resp 15 | Wt 165.0 lb

## 2022-12-08 VITALS — BP 158/70 | HR 55 | Temp 97.9°F | Resp 16

## 2022-12-08 DIAGNOSIS — D469 Myelodysplastic syndrome, unspecified: Secondary | ICD-10-CM

## 2022-12-08 DIAGNOSIS — Z79899 Other long term (current) drug therapy: Secondary | ICD-10-CM | POA: Diagnosis not present

## 2022-12-08 DIAGNOSIS — D61818 Other pancytopenia: Secondary | ICD-10-CM | POA: Diagnosis not present

## 2022-12-08 LAB — CBC WITH DIFFERENTIAL (CANCER CENTER ONLY)
Abs Immature Granulocytes: 0.02 10*3/uL (ref 0.00–0.07)
Basophils Absolute: 0 10*3/uL (ref 0.0–0.1)
Basophils Relative: 0 %
Eosinophils Absolute: 0 10*3/uL (ref 0.0–0.5)
Eosinophils Relative: 1 %
HCT: 32.1 % — ABNORMAL LOW (ref 39.0–52.0)
Hemoglobin: 10.2 g/dL — ABNORMAL LOW (ref 13.0–17.0)
Immature Granulocytes: 1 %
Lymphocytes Relative: 33 %
Lymphs Abs: 0.9 10*3/uL (ref 0.7–4.0)
MCH: 24.6 pg — ABNORMAL LOW (ref 26.0–34.0)
MCHC: 31.8 g/dL (ref 30.0–36.0)
MCV: 77.3 fL — ABNORMAL LOW (ref 80.0–100.0)
Monocytes Absolute: 0.6 10*3/uL (ref 0.1–1.0)
Monocytes Relative: 21 %
Neutro Abs: 1.3 10*3/uL — ABNORMAL LOW (ref 1.7–7.7)
Neutrophils Relative %: 44 %
Platelet Count: 32 10*3/uL — ABNORMAL LOW (ref 150–400)
RBC: 4.15 MIL/uL — ABNORMAL LOW (ref 4.22–5.81)
RDW: 16.3 % — ABNORMAL HIGH (ref 11.5–15.5)
WBC Count: 2.8 10*3/uL — ABNORMAL LOW (ref 4.0–10.5)
nRBC: 0 % (ref 0.0–0.2)

## 2022-12-08 LAB — CMP (CANCER CENTER ONLY)
ALT: 6 U/L (ref 0–44)
AST: 16 U/L (ref 15–41)
Albumin: 4.1 g/dL (ref 3.5–5.0)
Alkaline Phosphatase: 54 U/L (ref 38–126)
Anion gap: 7 (ref 5–15)
BUN: 23 mg/dL (ref 8–23)
CO2: 25 mmol/L (ref 22–32)
Calcium: 9 mg/dL (ref 8.9–10.3)
Chloride: 107 mmol/L (ref 98–111)
Creatinine: 1.14 mg/dL (ref 0.61–1.24)
GFR, Estimated: >60 mL/min (ref >60)
Glucose, Bld: 127 mg/dL — ABNORMAL HIGH (ref 70–99)
Potassium: 3.9 mmol/L (ref 3.5–5.1)
Sodium: 139 mmol/L (ref 135–145)
Total Bilirubin: 0.7 mg/dL (ref 0.3–1.2)
Total Protein: 6.9 g/dL (ref 6.5–8.1)

## 2022-12-08 MED ORDER — EPOETIN ALFA-EPBX 20000 UNIT/ML IJ SOLN
20000.0000 [IU] | Freq: Once | INTRAMUSCULAR | Status: AC
  Administered 2022-12-08: 20000 [IU] via SUBCUTANEOUS
  Filled 2022-12-08: qty 1

## 2022-12-08 NOTE — Progress Notes (Unsigned)
[facility] [phone]   [phone]  PROGRESS NOTE  Patient Care Team: Plotnikov, Georgina Quint, MD as PCP - General Parke Poisson, MD as PCP - Cardiology (Cardiology) Jeani Hawking, MD as Consulting Physician (Gastroenterology) [place], Amy, MD as Consulting Physician (Dermatology) Glendale Chard, DO as Consulting Physician (Neurology) Sheran Luz, MD as Consulting Physician (Physical Medicine and Rehabilitation) Durene Romans, MD as Consulting Physician (Orthopedic Surgery) Szabat, Vinnie Level, [facility] (Inactive) as Pharmacist (Pharmacist) Jaci Standard, MD as Consulting Physician (Hematology and Oncology)  Hematological/Oncological History # Pancytopenia #Myelodysplastic Syndrome, Single Lineage Dysplasia 11/18/2021: WBC 2.4, ANC 1.1, Hgb 9.7, MCV 84.0, Plt 58. 12/07/2021: Establish care with Dr. Leonides Schanz and Georga Kaufmann 12/19/2021: Bone marrow biopsy which showed a low-grade myelodysplastic syndrome.  6/28/203: Started retacrit 20,000 units q 2 weeks  Interval History:  Justin [name]. 82 y.o. male with medical history significant for newly diagnosed low-grade myelodysplastic syndrome who presents for a follow up visit. The patient's last visit was on 10/05/2022. In the interim, he continues on retacrit injections q 2 weeks.   On exam today Justin [name] reports he has been well overall in the interim since our last visit.  He did however have a COVID infection about 2 weeks ago but his symptoms are relatively mild.  He had mostly cough and some sore throat but he did not have any congestion.  He did not require any Paxlovid treatment.  He reports that he feels like he is back to about 100% though he does tire pretty easily.  He notes his energy is not real good but is not having any shortness of breath, lightheadedness or dizziness.  His bowel habits are unchanged without recurrent episodes of diarrhea or constipation. He denies fevers, chills,  sweats, shortness of breath, chest pain or cough. He has no other complaints.  MEDICAL HISTORY:  Past Medical History:  Diagnosis Date   Alcoholism (HCC)    Dry 13 years   Basal cell carcinoma    Right Chest   BPH (benign prostatic hyperplasia)    Diverticulosis of colon    Dizzy spells    none recent   DJD (degenerative joint disease)    lower back   Elbow fracture, left 2009   Dr Carola Frost   GERD (gastroesophageal reflux disease)    Glaucoma    History of TIAs 1 yrs ago   Hyperkeratosis    LBP (low back pain)    Macular degeneration    left wet right dry sees dr Luciana Axe   Melanoma [facility]) 2009   x3 Dr Amy [place]   Osteoarthritis    Restless leg syndrome    Thrombocytopenia (HCC) 11/18/2021   Tinnitus    Tobacco abuse    Uses walker    Wears glasses    reading   Wears partial dentures    upper    SURGICAL HISTORY: Past Surgical History:  Procedure Laterality Date   CATARACT EXTRACTION Bilateral 2018   HARDWARE REMOVAL Left 07/09/2020   Procedure: Left elbow hardware removal;  Surgeon: Yolonda Kida, MD;  Location: [facility];  Service: Orthopedics;  Laterality: Left;  60 mins   mda     Dr. Luciana Axe wet injection   MELANOMA EXCISION  2009   x3   RECONSTRUCTION MEDIAL COLLATERAL LIGAMENT ELBOW W/ TENDON GRAFT  2009   Left, Dr Carola Frost   TONSILLECTOMY  as child   TOTAL KNEE ARTHROPLASTY Right 11/09/2020   Procedure: TOTAL  KNEE ARTHROPLASTY;  Surgeon: Durene Romans, MD;  Location: [facility];  Service: Orthopedics;  Laterality: Right;    SOCIAL HISTORY: Social History   Socioeconomic History   Marital status: Married    Spouse name: Not on file   Number of children: 2   Years of education: Not on file   Highest education level: Not on file  Occupational History   Occupation: [place] Veteran - [employer]    Employer: RETIRED   Occupation: Scientist, research (physical sciences)   Occupation: Games developer  Tobacco Use   Smoking status: Former    Packs/day: 3.00     Years: 38.00    Additional pack years: 0.00    Total pack years: 114.00    Types: Cigarettes    Quit date: 1986    Years since quitting: 38.4   Smokeless tobacco: Never   Tobacco comments:    states he quit May 15 1985  Vaping Use   Vaping Use: Never used  Substance and Sexual Activity   Alcohol use: No    Comment: PREVIOUS - DRY 59yrs   Drug use: Not Currently   Sexual activity: Not Currently  Other Topics Concern   Not on file  Social History Narrative   Right handed   One story home   Drinks coffee q am   Social Determinants of Health   Financial Resource Strain: Low Risk  (08/07/2022)   Overall Financial Resource Strain (CARDIA)    Difficulty of Paying Living Expenses: Not hard at all  Food Insecurity: No Food Insecurity (08/07/2022)   Hunger Vital Sign    Worried About Running Out of Food in the Last Year: Never true    Ran Out of Food in the Last Year: Never true  Transportation Needs: No Transportation Needs (08/07/2022)   PRAPARE - Administrator, Civil Service (Medical): No    Lack of Transportation (Non-Medical): No  Physical Activity: Inactive (08/07/2022)   Exercise Vital Sign    Days of Exercise per Week: 0 days    Minutes of Exercise per Session: 0 min  Stress: No Stress Concern Present (08/07/2022)   [employer] of Occupational Health - Occupational Stress Questionnaire    Feeling of Stress : Not at all  Social Connections: Moderately Isolated (08/07/2022)   Social Connection and Isolation Panel [NHANES]    Frequency of Communication with Friends and Family: Twice a week    Frequency of Social Gatherings with Friends and Family: Twice a week    Attends Religious Services: Never    Database administrator or Organizations: No    Attends Banker Meetings: Never    Marital Status: Married  Catering manager Violence: Not At Risk (08/07/2022)   Humiliation, Afraid, Rape, and Kick questionnaire    Fear of Current or Ex-Partner: No     Emotionally Abused: No    Physically Abused: No    Sexually Abused: No    FAMILY HISTORY: Family History  Problem Relation Age of Onset   Cancer Mother        ?breast   Cancer Father        Lung    ALLERGIES:  is allergic to levofloxacin.  MEDICATIONS:  Current Outpatient Medications  Medication Sig Dispense Refill   Acetaminophen (TYLENOL PO) Tylenol     amLODipine (NORVASC) 5 MG tablet Take 1 tablet (5 mg total) by mouth daily. 90 tablet 3   calcium carbonate (TUMS EX) 750 MG chewable tablet Chew 1 tablet by  mouth daily.     carbidopa-levodopa (SINEMET IR) 25-100 MG tablet For breakthrough restless leg, you can take 1 tablet at bedtime.  OK to take extra tab as needed during the day. (Patient taking differently: Take 1 tablet by mouth at bedtime.) 100 tablet 3   Cholecalciferol 1000 UNITS tablet Take 3,000 Units by mouth daily.     Cyanocobalamin (VITAMIN B-12) 1000 MCG SUBL DISSOLVE 2 TABLETS IN MOUTH EVERY DAY 180 tablet 1   ferrous sulfate 325 (65 FE) MG tablet Take 650 mg by mouth daily with breakfast.     finasteride (PROSCAR) 5 MG tablet Take 1 tablet (5 mg total) by mouth daily. 90 tablet 1   gabapentin (NEURONTIN) 300 MG capsule Take 1 capsule (300 mg total) by mouth 4 (four) times daily. 360 capsule 1   magnesium hydroxide (MILK OF MAGNESIA) 400 MG/5ML suspension Take 15 mLs by mouth daily as needed for mild constipation.     meclizine (ANTIVERT) 12.5 MG tablet TAKE 1 TABLET BY MOUTH 3 TIMES DAILY AS NEEDED FOR DIZZINESS. 90 tablet 2   Multiple Vitamins-Minerals (PRESERVISION AREDS PO) Take 2 tablets by mouth 2 (two) times daily.     Oxycodone HCl 10 MG TABS Take 10 mg by mouth 5 (five) times daily as needed (pain). Typically only uses 4 times daily     Polyethyl Glycol-Propyl Glycol (SYSTANE OP) Place 1 drop into both eyes 2 (two) times daily as needed (dry eyes). [facility]     pravastatin (PRAVACHOL) 20 MG tablet Take 1 tablet (20 mg total) by mouth daily. 90 tablet 1    rOPINIRole (REQUIP) 2 MG tablet TAKE 1 TABLET BY MOUTH AT AT NOON AND 1 TAB AT 4PM AND 1 TAB AT BEDTIME 270 tablet 1   tamsulosin (FLOMAX) 0.4 MG CAPS capsule Take 1 capsule (0.4 mg total) by mouth in the morning. 90 capsule 1   terazosin (HYTRIN) 2 MG capsule TAKE 1 CAPSULE BY MOUTH EVERYDAY AT BEDTIME 90 capsule 1   No current facility-administered medications for this visit.    REVIEW OF SYSTEMS:   Constitutional: ( - ) fevers, ( - )  chills , ( - ) night sweats Eyes: ( - ) blurriness of vision, ( - ) double vision, ( - ) watery eyes Ears, nose, mouth, throat, and face: ( - ) mucositis, ( - ) sore throat Respiratory: ( - ) cough, ( - ) dyspnea, ( - ) wheezes Cardiovascular: ( - ) palpitation, ( - ) chest discomfort, ( - ) lower extremity swelling Gastrointestinal:  ( - ) nausea, ( - ) heartburn, ( - ) change in bowel habits Skin: ( - ) abnormal skin rashes Lymphatics: ( - ) new lymphadenopathy, ( - ) easy bruising Neurological: ( - ) numbness, ( - ) tingling, ( - ) new weaknesses Behavioral/Psych: ( - ) mood change, ( - ) new changes  All other systems were reviewed with the patient and are negative.  PHYSICAL EXAMINATION:  Vitals:   12/08/22 1308  BP: (!) 153/79  Pulse: (!) 51  Resp: 15  Temp: 97.7 F (36.5 C)  SpO2: 98%    Filed Weights   12/08/22 1308  Weight: 165 lb (74.8 kg)     GENERAL: Well-appearing elderly Caucasian male, alert, no distress and comfortable SKIN: skin color, texture, turgor are normal, no rashes or significant lesions EYES: conjunctiva are pink and non-injected, sclera clear LUNGS: clear to auscultation and percussion with normal breathing effort HEART: regular rate &  rhythm and no murmurs and no lower extremity edema Musculoskeletal: no cyanosis of digits and no clubbing  PSYCH: alert & oriented x 3, fluent speech NEURO: no focal motor/sensory deficits  LABORATORY DATA:  I have reviewed the data as listed    Latest Ref Rng & Units  12/08/2022   12:35 PM 11/16/2022    8:30 AM 11/02/2022    8:40 AM  CBC  WBC 4.0 - 10.5 K/uL 2.8  4.2  3.4   Hemoglobin 13.0 - 17.0 g/dL 19.1  47.8  29.5   Hematocrit 39.0 - 52.0 % 32.1  34.6  33.6   Platelets 150 - 400 K/uL 32  32  28        Latest Ref Rng & Units 12/08/2022   12:35 PM 11/16/2022    8:30 AM 11/02/2022    8:40 AM  CMP  Glucose 70 - 99 mg/dL 621  308  657   BUN 8 - 23 mg/dL 23  26  27    Creatinine 0.61 - 1.24 mg/dL 8.46  9.62  9.52   Sodium 135 - 145 mmol/L 139  140  141   Potassium 3.5 - 5.1 mmol/L 3.9  4.1  4.1   Chloride 98 - 111 mmol/L 107  107  108   CO2 22 - 32 mmol/L 25  27  28    Calcium 8.9 - 10.3 mg/dL 9.0  9.0  8.9   Total Protein 6.5 - 8.1 g/dL 6.9  6.9  6.8   Total Bilirubin 0.3 - 1.2 mg/dL 0.7  0.8  0.7   Alkaline Phos 38 - 126 U/L 54  54  58   AST 15 - 41 U/L 16  18  15    ALT 0 - 44 U/L 6  5  <5     Lab Results  Component Value Date   MPROTEIN Not Observed 12/07/2021   Lab Results  Component Value Date   KPAFRELGTCHN 45.3 (H) 12/07/2021   LAMBDASER 28.9 (H) 12/07/2021   KAPLAMBRATIO 1.57 12/07/2021   RADIOGRAPHIC STUDIES: No results found.  ASSESSMENT & PLAN Justin [name]. Is a 82 y.o. male who returns for a follow up for MDS.     # Myelodysplastic Syndrome, Single Lineage Dysplasia -- At this time Justin [name] has a low-grade myelodysplasia that is anemia predominant. --Given that his hemoglobin is consistently less than 10 I would recommend we proceed with erythropoietin therapy. Discussed with the patient other options including hypomethylating therapies and Luspatercept, however given that anemia is his primary issue would recommend erythropoietin to see if this helps to elevate his other blood counts as well. --No clear signs of nutritional deficiency during our prior evaluation. --MDS FISH study was normal.  --Started retacrit 20,000 units q 2 weeks on 12/28/2021.  PLAN: --Due for retacrit injection today --Labs from today  reviewed. Hgb stable at 10.2, white blood cell 2.8, MCV 77.3, and platelets of 32. Will continue to monitor as long as platelet count is >20K. --Continue with q 2 week retacrit injections as scheduled --RTC in 8 weeks time to assure he is tolerating the shots   No orders of the defined types were placed in this encounter.   All questions were answered. The patient knows to call the clinic with any problems, questions or concerns.  A total of more than 25 minutes were spent on this encounter with face-to-face time and non-face-to-face time, including preparing to see the patient, ordering tests and/or medications, counseling the patient and coordination  of care as outlined above.   Justin Barns, MD Department of Hematology/Oncology [facility] Phone: [phone] Pager: [phone] Email: [email or web address]  12/10/2022 6:45 PM

## 2022-12-10 ENCOUNTER — Encounter: Payer: Self-pay | Admitting: Hematology and Oncology

## 2022-12-12 NOTE — Progress Notes (Signed)
Subjective: Chief Complaint  Patient presents with   Nail Problem    Patient came in today for routine foot care, nail trim, nail are thick and yellow    82 y.o. returns the office today for painful, elongated, thickened toenails which he cannot trim himself and also he has noticed a skin lesion on the left second. No open lesions, swelling, redness or signs of infection he reports.   PCP: Tresa Garter, MD Last seen 11/21/2022  Objective: AAO 3, NAD DP/PT pulses palpable, CRT less than 3 seconds Neurological status is unchanged.  Nails hypertrophic, dystrophic, elongated, brittle, discolored 10. There is tenderness overlying the nails 1-5 bilaterally. There is no surrounding erythema or drainage along the nail sites. At the distal aspect of the left second toe this appears to be a superficial skin fissure without any swelling, redness, drainage or signs of infection. Hammertoes are present. No pain with calf compression, swelling, warmth, erythema.  Assessment: Patient presents with symptomatic onychomycosis; skin fissure.   Plan: -Treatment options including alternatives, risks, complications were discussed -Nails sharply debrided 10 without complication/bleeding. -Hyperkeratotic lesion sharply debrided x 1 without any complications.  No open lesion, recommend a small amount of moisturizer daily and offloading.  -Neuropathy is stable, will continue to monitor  -Monitor for any clinical signs or symptoms of infection and directed to call the office immediately should any occur or go to the ER.  Vivi Barrack DPM

## 2022-12-14 ENCOUNTER — Inpatient Hospital Stay: Payer: PPO

## 2022-12-21 ENCOUNTER — Inpatient Hospital Stay: Payer: PPO

## 2022-12-21 ENCOUNTER — Other Ambulatory Visit: Payer: Self-pay

## 2022-12-21 VITALS — BP 141/82 | HR 106 | Temp 97.8°F | Resp 16

## 2022-12-21 DIAGNOSIS — D469 Myelodysplastic syndrome, unspecified: Secondary | ICD-10-CM

## 2022-12-21 LAB — CBC WITH DIFFERENTIAL (CANCER CENTER ONLY)
Abs Immature Granulocytes: 0 10*3/uL (ref 0.00–0.07)
Basophils Absolute: 0 10*3/uL (ref 0.0–0.1)
Basophils Relative: 1 %
Eosinophils Absolute: 0 10*3/uL (ref 0.0–0.5)
Eosinophils Relative: 2 %
HCT: 32.6 % — ABNORMAL LOW (ref 39.0–52.0)
Hemoglobin: 10.3 g/dL — ABNORMAL LOW (ref 13.0–17.0)
Immature Granulocytes: 0 %
Lymphocytes Relative: 48 %
Lymphs Abs: 1 10*3/uL (ref 0.7–4.0)
MCH: 25.2 pg — ABNORMAL LOW (ref 26.0–34.0)
MCHC: 31.6 g/dL (ref 30.0–36.0)
MCV: 79.7 fL — ABNORMAL LOW (ref 80.0–100.0)
Monocytes Absolute: 0.6 10*3/uL (ref 0.1–1.0)
Monocytes Relative: 27 %
Neutro Abs: 0.5 10*3/uL — ABNORMAL LOW (ref 1.7–7.7)
Neutrophils Relative %: 22 %
Platelet Count: 40 10*3/uL — ABNORMAL LOW (ref 150–400)
RBC: 4.09 MIL/uL — ABNORMAL LOW (ref 4.22–5.81)
RDW: 17.2 % — ABNORMAL HIGH (ref 11.5–15.5)
Smear Review: NORMAL
WBC Count: 2.1 10*3/uL — ABNORMAL LOW (ref 4.0–10.5)
nRBC: 0 % (ref 0.0–0.2)

## 2022-12-21 LAB — CMP (CANCER CENTER ONLY)
ALT: <5 U/L (ref 0–44)
AST: 16 U/L (ref 15–41)
Albumin: 3.9 g/dL (ref 3.5–5.0)
Alkaline Phosphatase: 55 U/L (ref 38–126)
Anion gap: 7 (ref 5–15)
BUN: 21 mg/dL (ref 8–23)
CO2: 26 mmol/L (ref 22–32)
Calcium: 9 mg/dL (ref 8.9–10.3)
Chloride: 107 mmol/L (ref 98–111)
Creatinine: 1.23 mg/dL (ref 0.61–1.24)
GFR, Estimated: 58 mL/min — ABNORMAL LOW (ref >60)
Glucose, Bld: 108 mg/dL — ABNORMAL HIGH (ref 70–99)
Potassium: 3.9 mmol/L (ref 3.5–5.1)
Sodium: 140 mmol/L (ref 135–145)
Total Bilirubin: 0.7 mg/dL (ref 0.3–1.2)
Total Protein: 6.5 g/dL (ref 6.5–8.1)

## 2022-12-21 MED ORDER — EPOETIN ALFA-EPBX 20000 UNIT/ML IJ SOLN
20000.0000 [IU] | Freq: Once | INTRAMUSCULAR | Status: AC
  Administered 2022-12-21: 20000 [IU] via SUBCUTANEOUS
  Filled 2022-12-21: qty 1

## 2022-12-22 ENCOUNTER — Ambulatory Visit (INDEPENDENT_AMBULATORY_CARE_PROVIDER_SITE_OTHER): Payer: PPO | Admitting: Family Medicine

## 2022-12-22 ENCOUNTER — Encounter: Payer: Self-pay | Admitting: Family Medicine

## 2022-12-22 ENCOUNTER — Ambulatory Visit (INDEPENDENT_AMBULATORY_CARE_PROVIDER_SITE_OTHER): Payer: PPO

## 2022-12-22 VITALS — BP 120/78 | HR 80 | Temp 97.6°F | Ht 68.0 in | Wt 166.5 lb

## 2022-12-22 DIAGNOSIS — M545 Low back pain, unspecified: Secondary | ICD-10-CM

## 2022-12-22 DIAGNOSIS — M4316 Spondylolisthesis, lumbar region: Secondary | ICD-10-CM | POA: Diagnosis not present

## 2022-12-22 DIAGNOSIS — S2242XD Multiple fractures of ribs, left side, subsequent encounter for fracture with routine healing: Secondary | ICD-10-CM

## 2022-12-22 DIAGNOSIS — M546 Pain in thoracic spine: Secondary | ICD-10-CM | POA: Diagnosis not present

## 2022-12-22 DIAGNOSIS — M47814 Spondylosis without myelopathy or radiculopathy, thoracic region: Secondary | ICD-10-CM | POA: Diagnosis not present

## 2022-12-22 NOTE — Progress Notes (Signed)
Subjective:     Patient ID: Justin [name]., male    DOB: 1940-08-10, 82 y.o.   MRN: [id number]  Chief Complaint  Patient presents with   Flank Pain    Patient is some pain on his left side, very painful started 2-3 weeks ago, constant pain     HPI  Discussed the use of AI scribe software for clinical note transcription with the patient, who gave verbal consent to proceed.  History of Present Illness          C/o worsening left side pain for the past 2-3 weeks. No recent injury. States he had rib fractures in April on the left near the pain. Pain is relieved at rest and worse with certain movements. Able to take deep breaths. No fever, chills, dizziness, chest pain, palpitations, shortness of breath, abdominal pain, N/V/D.   In pain management and takes Oxycodone.    Health Maintenance Due  Topic Date Due   COVID-19 Vaccine (4 - 2023-24 season) 03/03/2022    Past Medical History:  Diagnosis Date   Alcoholism (HCC)    Dry 13 years   Basal cell carcinoma    Right Chest   BPH (benign prostatic hyperplasia)    Diverticulosis of colon    Dizzy spells    none recent   DJD (degenerative joint disease)    lower back   Elbow fracture, left 2009   Dr Carola Frost   GERD (gastroesophageal reflux disease)    Glaucoma    History of TIAs 1 yrs ago   Hyperkeratosis    LBP (low back pain)    Macular degeneration    left wet right dry sees dr Luciana Axe   Melanoma [facility]) 2009   x3 Dr Amy [place]   Osteoarthritis    Restless leg syndrome    Thrombocytopenia (HCC) 11/18/2021   Tinnitus    Tobacco abuse    Uses walker    Wears glasses    reading   Wears partial dentures    upper    Past Surgical History:  Procedure Laterality Date   CATARACT EXTRACTION Bilateral 2018   HARDWARE REMOVAL Left 07/09/2020   Procedure: Left elbow hardware removal;  Surgeon: Yolonda Kida, MD;  Location: [facility];  Service: Orthopedics;  Laterality: Left;  60 mins   mda      Dr. Luciana Axe wet injection   MELANOMA EXCISION  2009   x3   RECONSTRUCTION MEDIAL COLLATERAL LIGAMENT ELBOW W/ TENDON GRAFT  2009   Left, Dr Carola Frost   TONSILLECTOMY  as child   TOTAL KNEE ARTHROPLASTY Right 11/09/2020   Procedure: TOTAL KNEE ARTHROPLASTY;  Surgeon: Durene Romans, MD;  Location: [facility];  Service: Orthopedics;  Laterality: Right;    Family History  Problem Relation Age of Onset   Cancer Mother        ?breast   Cancer Father        Lung    Social History   Socioeconomic History   Marital status: Married    Spouse name: Not on file   Number of children: 2   Years of education: Not on file   Highest education level: Not on file  Occupational History   Occupation: [place] Veteran - [employer]    Employer: RETIRED   Occupation: Scientist, research (physical sciences)   Occupation: Games developer  Tobacco Use   Smoking status: Former    Packs/day: 3.00    Years: 38.00    Additional pack years: 0.00  Total pack years: 114.00    Types: Cigarettes    Quit date: 10    Years since quitting: 38.4   Smokeless tobacco: Never   Tobacco comments:    states he quit May 15 1985  Vaping Use   Vaping Use: Never used  Substance and Sexual Activity   Alcohol use: No    Comment: PREVIOUS - DRY 13yrs   Drug use: Not Currently   Sexual activity: Not Currently  Other Topics Concern   Not on file  Social History Narrative   Right handed   One story home   Drinks coffee q am   Social Determinants of Health   Financial Resource Strain: Low Risk  (08/07/2022)   Overall Financial Resource Strain (CARDIA)    Difficulty of Paying Living Expenses: Not hard at all  Food Insecurity: No Food Insecurity (08/07/2022)   Hunger Vital Sign    Worried About Running Out of Food in the Last Year: Never true    Ran Out of Food in the Last Year: Never true  Transportation Needs: No Transportation Needs (08/07/2022)   PRAPARE - Administrator, Civil Service (Medical): No    Lack of  Transportation (Non-Medical): No  Physical Activity: Inactive (08/07/2022)   Exercise Vital Sign    Days of Exercise per Week: 0 days    Minutes of Exercise per Session: 0 min  Stress: No Stress Concern Present (08/07/2022)   [employer] of Occupational Health - Occupational Stress Questionnaire    Feeling of Stress : Not at all  Social Connections: Moderately Isolated (08/07/2022)   Social Connection and Isolation Panel [NHANES]    Frequency of Communication with Friends and Family: Twice a week    Frequency of Social Gatherings with Friends and Family: Twice a week    Attends Religious Services: Never    Database administrator or Organizations: No    Attends Banker Meetings: Never    Marital Status: Married  Catering manager Violence: Not At Risk (08/07/2022)   Humiliation, Afraid, Rape, and Kick questionnaire    Fear of Current or Ex-Partner: No    Emotionally Abused: No    Physically Abused: No    Sexually Abused: No    Outpatient Medications Prior to Visit  Medication Sig Dispense Refill   Acetaminophen (TYLENOL PO) Tylenol     amLODipine (NORVASC) 5 MG tablet Take 1 tablet (5 mg total) by mouth daily. 90 tablet 3   calcium carbonate (TUMS EX) 750 MG chewable tablet Chew 1 tablet by mouth daily.     carbidopa-levodopa (SINEMET IR) 25-100 MG tablet For breakthrough restless leg, you can take 1 tablet at bedtime.  OK to take extra tab as needed during the day. (Patient taking differently: Take 1 tablet by mouth at bedtime.) 100 tablet 3   Cholecalciferol 1000 UNITS tablet Take 3,000 Units by mouth daily.     Cyanocobalamin (VITAMIN B-12) 1000 MCG SUBL DISSOLVE 2 TABLETS IN MOUTH EVERY DAY 180 tablet 1   ferrous sulfate 325 (65 FE) MG tablet Take 650 mg by mouth daily with breakfast.     finasteride (PROSCAR) 5 MG tablet Take 1 tablet (5 mg total) by mouth daily. 90 tablet 1   gabapentin (NEURONTIN) 300 MG capsule Take 1 capsule (300 mg total) by mouth 4 (four) times  daily. 360 capsule 1   magnesium hydroxide (MILK OF MAGNESIA) 400 MG/5ML suspension Take 15 mLs by mouth daily as needed for mild constipation.  meclizine (ANTIVERT) 12.5 MG tablet TAKE 1 TABLET BY MOUTH 3 TIMES DAILY AS NEEDED FOR DIZZINESS. 90 tablet 2   Multiple Vitamins-Minerals (PRESERVISION AREDS PO) Take 2 tablets by mouth 2 (two) times daily.     Oxycodone HCl 10 MG TABS Take 10 mg by mouth 5 (five) times daily as needed (pain). Typically only uses 4 times daily     Polyethyl Glycol-Propyl Glycol (SYSTANE OP) Place 1 drop into both eyes 2 (two) times daily as needed (dry eyes). [facility]     pravastatin (PRAVACHOL) 20 MG tablet Take 1 tablet (20 mg total) by mouth daily. 90 tablet 1   rOPINIRole (REQUIP) 2 MG tablet TAKE 1 TABLET BY MOUTH AT AT NOON AND 1 TAB AT 4PM AND 1 TAB AT BEDTIME 270 tablet 1   tamsulosin (FLOMAX) 0.4 MG CAPS capsule Take 1 capsule (0.4 mg total) by mouth in the morning. 90 capsule 1   terazosin (HYTRIN) 2 MG capsule TAKE 1 CAPSULE BY MOUTH EVERYDAY AT BEDTIME 90 capsule 1   No facility-administered medications prior to visit.    Allergies  Allergen Reactions   Levofloxacin     REACTION: hands pealed    ROS     Objective:    Physical Exam Constitutional:      General: He is not in acute distress.    Appearance: He is not ill-appearing.  Eyes:     Extraocular Movements: Extraocular movements intact.     Conjunctiva/sclera: Conjunctivae normal.  Cardiovascular:     Rate and Rhythm: Normal rate and regular rhythm.  Pulmonary:     Effort: Pulmonary effort is normal.     Breath sounds: Normal breath sounds.  Chest:     Chest wall: No tenderness.  Musculoskeletal:     Cervical back: Normal range of motion and neck supple.     Thoracic back: Tenderness present. Decreased range of motion. Scoliosis present.     Lumbar back: Tenderness present. Scoliosis present.       Back:     Comments: Left thoracic and lumbar paraspinal muscle TTP, bruise  purple-yellow on left low back, non tender.   Skin:    General: Skin is warm and dry.     Findings: Bruising present.  Neurological:     General: No focal deficit present.     Mental Status: He is alert and oriented to person, place, and time.  Psychiatric:        Mood and Affect: Mood normal.        Behavior: Behavior normal.      BP 120/78   Pulse 80   Temp 97.6 F (36.4 C) (Oral)   Ht 5\' 8"  (1.727 m)   Wt 166 lb 8 oz (75.5 kg)   SpO2 97%   BMI 25.32 kg/m  Wt Readings from Last 3 Encounters:  12/22/22 166 lb 8 oz (75.5 kg)  12/08/22 165 lb (74.8 kg)  11/21/22 166 lb (75.3 kg)       Assessment & Plan:   Problem List Items Addressed This Visit       Musculoskeletal and Integument   Rib fractures   Relevant Orders   DG Thoracic Spine W/Swimmers (Completed)   DG Lumbar Spine Complete (Completed)     Other   Acute left-sided thoracic back pain - Primary   Relevant Orders   DG Thoracic Spine W/Swimmers (Completed)   Low back pain   Relevant Orders   DG Lumbar Spine Complete (Completed)   No red  flag symptoms.  Bilateral lower extremities are neurovascularly intact. Review of rib x-rays in March and MR chest in April reviewed. Stat thoracic and lumbar spine x-rays ordered.  Continue current pain medication per pain management specialist.  Use ice or heat.  Topical analgesics as needed.  Follow-up pending results.  Strict precautions that if he is worsening that he will go to urgent care or the emergency department this weekend.  I am having Justin Crane. Elmarie [name]. maintain his Cholecalciferol, Multiple Vitamins-Minerals (PRESERVISION AREDS PO), carbidopa-levodopa, Oxycodone HCl, ferrous sulfate, Polyethyl Glycol-Propyl Glycol (SYSTANE OP), magnesium hydroxide, Acetaminophen (TYLENOL PO), Vitamin B-12, calcium carbonate, amLODipine, gabapentin, terazosin, meclizine, finasteride, pravastatin, rOPINIRole, and tamsulosin.  No orders of the defined types were placed in  this encounter.

## 2022-12-27 DIAGNOSIS — Z5181 Encounter for therapeutic drug level monitoring: Secondary | ICD-10-CM | POA: Diagnosis not present

## 2022-12-27 DIAGNOSIS — M5459 Other low back pain: Secondary | ICD-10-CM | POA: Diagnosis not present

## 2022-12-27 DIAGNOSIS — M5416 Radiculopathy, lumbar region: Secondary | ICD-10-CM | POA: Diagnosis not present

## 2022-12-27 DIAGNOSIS — Z79899 Other long term (current) drug therapy: Secondary | ICD-10-CM | POA: Diagnosis not present

## 2022-12-27 DIAGNOSIS — M47896 Other spondylosis, lumbar region: Secondary | ICD-10-CM | POA: Diagnosis not present

## 2022-12-28 ENCOUNTER — Inpatient Hospital Stay: Payer: PPO

## 2022-12-29 DIAGNOSIS — D1801 Hemangioma of skin and subcutaneous tissue: Secondary | ICD-10-CM | POA: Diagnosis not present

## 2022-12-29 DIAGNOSIS — L570 Actinic keratosis: Secondary | ICD-10-CM | POA: Diagnosis not present

## 2022-12-29 DIAGNOSIS — D2262 Melanocytic nevi of left upper limb, including shoulder: Secondary | ICD-10-CM | POA: Diagnosis not present

## 2022-12-29 DIAGNOSIS — L821 Other seborrheic keratosis: Secondary | ICD-10-CM | POA: Diagnosis not present

## 2022-12-29 DIAGNOSIS — Z85828 Personal history of other malignant neoplasm of skin: Secondary | ICD-10-CM | POA: Diagnosis not present

## 2022-12-29 DIAGNOSIS — D692 Other nonthrombocytopenic purpura: Secondary | ICD-10-CM | POA: Diagnosis not present

## 2022-12-29 DIAGNOSIS — D2371 Other benign neoplasm of skin of right lower limb, including hip: Secondary | ICD-10-CM | POA: Diagnosis not present

## 2023-01-05 ENCOUNTER — Inpatient Hospital Stay: Payer: PPO | Attending: Physician Assistant

## 2023-01-05 ENCOUNTER — Other Ambulatory Visit: Payer: Self-pay

## 2023-01-05 ENCOUNTER — Inpatient Hospital Stay: Payer: PPO

## 2023-01-05 VITALS — BP 148/68 | HR 75 | Temp 97.9°F | Resp 17

## 2023-01-05 DIAGNOSIS — D469 Myelodysplastic syndrome, unspecified: Secondary | ICD-10-CM | POA: Insufficient documentation

## 2023-01-05 LAB — CBC WITH DIFFERENTIAL (CANCER CENTER ONLY)
Abs Immature Granulocytes: 0.02 10*3/uL (ref 0.00–0.07)
Basophils Absolute: 0 10*3/uL (ref 0.0–0.1)
Basophils Relative: 1 %
Eosinophils Absolute: 0 10*3/uL (ref 0.0–0.5)
Eosinophils Relative: 1 %
HCT: 35 % — ABNORMAL LOW (ref 39.0–52.0)
Hemoglobin: 10.7 g/dL — ABNORMAL LOW (ref 13.0–17.0)
Immature Granulocytes: 1 %
Lymphocytes Relative: 45 %
Lymphs Abs: 1.1 10*3/uL (ref 0.7–4.0)
MCH: 24.8 pg — ABNORMAL LOW (ref 26.0–34.0)
MCHC: 30.6 g/dL (ref 30.0–36.0)
MCV: 81 fL (ref 80.0–100.0)
Monocytes Absolute: 0.7 10*3/uL (ref 0.1–1.0)
Monocytes Relative: 29 %
Neutro Abs: 0.6 10*3/uL — ABNORMAL LOW (ref 1.7–7.7)
Neutrophils Relative %: 23 %
Platelet Count: 21 10*3/uL — ABNORMAL LOW (ref 150–400)
RBC: 4.32 MIL/uL (ref 4.22–5.81)
RDW: 17.3 % — ABNORMAL HIGH (ref 11.5–15.5)
WBC Count: 2.4 10*3/uL — ABNORMAL LOW (ref 4.0–10.5)
nRBC: 0 % (ref 0.0–0.2)

## 2023-01-05 LAB — CMP (CANCER CENTER ONLY)
ALT: <5 U/L (ref 0–44)
AST: 15 U/L (ref 15–41)
Albumin: 3.9 g/dL (ref 3.5–5.0)
Alkaline Phosphatase: 53 U/L (ref 38–126)
Anion gap: 8 (ref 5–15)
BUN: 23 mg/dL (ref 8–23)
CO2: 25 mmol/L (ref 22–32)
Calcium: 9.2 mg/dL (ref 8.9–10.3)
Chloride: 109 mmol/L (ref 98–111)
Creatinine: 1.16 mg/dL (ref 0.61–1.24)
GFR, Estimated: >60 mL/min (ref >60)
Glucose, Bld: 103 mg/dL — ABNORMAL HIGH (ref 70–99)
Potassium: 3.7 mmol/L (ref 3.5–5.1)
Sodium: 142 mmol/L (ref 135–145)
Total Bilirubin: 0.9 mg/dL (ref 0.3–1.2)
Total Protein: 6.3 g/dL — ABNORMAL LOW (ref 6.5–8.1)

## 2023-01-05 MED ORDER — EPOETIN ALFA-EPBX 20000 UNIT/ML IJ SOLN
20000.0000 [IU] | Freq: Once | INTRAMUSCULAR | Status: AC
  Administered 2023-01-05: 20000 [IU] via SUBCUTANEOUS
  Filled 2023-01-05: qty 1

## 2023-01-10 DIAGNOSIS — L0889 Other specified local infections of the skin and subcutaneous tissue: Secondary | ICD-10-CM | POA: Diagnosis not present

## 2023-01-10 DIAGNOSIS — Z85828 Personal history of other malignant neoplasm of skin: Secondary | ICD-10-CM | POA: Diagnosis not present

## 2023-01-10 DIAGNOSIS — B029 Zoster without complications: Secondary | ICD-10-CM | POA: Diagnosis not present

## 2023-01-12 ENCOUNTER — Other Ambulatory Visit: Payer: Self-pay | Admitting: Internal Medicine

## 2023-01-14 ENCOUNTER — Encounter: Payer: Self-pay | Admitting: Hematology and Oncology

## 2023-01-18 ENCOUNTER — Other Ambulatory Visit: Payer: Self-pay

## 2023-01-18 ENCOUNTER — Inpatient Hospital Stay: Payer: PPO

## 2023-01-18 ENCOUNTER — Other Ambulatory Visit: Payer: Self-pay | Admitting: Hematology and Oncology

## 2023-01-18 VITALS — BP 130/69 | HR 71 | Temp 97.6°F | Resp 16

## 2023-01-18 DIAGNOSIS — D469 Myelodysplastic syndrome, unspecified: Secondary | ICD-10-CM

## 2023-01-18 LAB — CMP (CANCER CENTER ONLY)
ALT: <5 U/L (ref 0–44)
AST: 17 U/L (ref 15–41)
Albumin: 4.1 g/dL (ref 3.5–5.0)
Alkaline Phosphatase: 55 U/L (ref 38–126)
Anion gap: 7 (ref 5–15)
BUN: 22 mg/dL (ref 8–23)
CO2: 25 mmol/L (ref 22–32)
Calcium: 9 mg/dL (ref 8.9–10.3)
Chloride: 108 mmol/L (ref 98–111)
Creatinine: 1.11 mg/dL (ref 0.61–1.24)
GFR, Estimated: >60 mL/min (ref >60)
Glucose, Bld: 106 mg/dL — ABNORMAL HIGH (ref 70–99)
Potassium: 4 mmol/L (ref 3.5–5.1)
Sodium: 140 mmol/L (ref 135–145)
Total Bilirubin: 0.7 mg/dL (ref 0.3–1.2)
Total Protein: 6.7 g/dL (ref 6.5–8.1)

## 2023-01-18 LAB — CBC WITH DIFFERENTIAL (CANCER CENTER ONLY)
Abs Immature Granulocytes: 0.01 10*3/uL (ref 0.00–0.07)
Basophils Absolute: 0 10*3/uL (ref 0.0–0.1)
Basophils Relative: 1 %
Eosinophils Absolute: 0 10*3/uL (ref 0.0–0.5)
Eosinophils Relative: 1 %
HCT: 33 % — ABNORMAL LOW (ref 39.0–52.0)
Hemoglobin: 10.5 g/dL — ABNORMAL LOW (ref 13.0–17.0)
Immature Granulocytes: 0 %
Lymphocytes Relative: 44 %
Lymphs Abs: 1.2 10*3/uL (ref 0.7–4.0)
MCH: 25.2 pg — ABNORMAL LOW (ref 26.0–34.0)
MCHC: 31.8 g/dL (ref 30.0–36.0)
MCV: 79.3 fL — ABNORMAL LOW (ref 80.0–100.0)
Monocytes Absolute: 0.8 10*3/uL (ref 0.1–1.0)
Monocytes Relative: 28 %
Neutro Abs: 0.7 10*3/uL — ABNORMAL LOW (ref 1.7–7.7)
Neutrophils Relative %: 26 %
Platelet Count: 30 10*3/uL — ABNORMAL LOW (ref 150–400)
RBC: 4.16 MIL/uL — ABNORMAL LOW (ref 4.22–5.81)
RDW: 17.3 % — ABNORMAL HIGH (ref 11.5–15.5)
WBC Count: 2.7 10*3/uL — ABNORMAL LOW (ref 4.0–10.5)
nRBC: 0 % (ref 0.0–0.2)

## 2023-01-18 MED ORDER — EPOETIN ALFA-EPBX 20000 UNIT/ML IJ SOLN
20000.0000 [IU] | Freq: Once | INTRAMUSCULAR | Status: AC
  Administered 2023-01-18: 20000 [IU] via SUBCUTANEOUS
  Filled 2023-01-18: qty 1

## 2023-01-31 DIAGNOSIS — H353111 Nonexudative age-related macular degeneration, right eye, early dry stage: Secondary | ICD-10-CM | POA: Diagnosis not present

## 2023-01-31 DIAGNOSIS — H43812 Vitreous degeneration, left eye: Secondary | ICD-10-CM | POA: Diagnosis not present

## 2023-01-31 DIAGNOSIS — H353221 Exudative age-related macular degeneration, left eye, with active choroidal neovascularization: Secondary | ICD-10-CM | POA: Diagnosis not present

## 2023-01-31 DIAGNOSIS — H401130 Primary open-angle glaucoma, bilateral, stage unspecified: Secondary | ICD-10-CM | POA: Diagnosis not present

## 2023-02-01 ENCOUNTER — Inpatient Hospital Stay: Payer: PPO | Attending: Physician Assistant

## 2023-02-01 ENCOUNTER — Inpatient Hospital Stay: Payer: PPO | Admitting: Hematology and Oncology

## 2023-02-01 ENCOUNTER — Other Ambulatory Visit: Payer: Self-pay

## 2023-02-01 ENCOUNTER — Inpatient Hospital Stay: Payer: PPO

## 2023-02-01 VITALS — BP 146/59 | HR 63 | Temp 97.3°F | Resp 13 | Wt 165.6 lb

## 2023-02-01 DIAGNOSIS — D469 Myelodysplastic syndrome, unspecified: Secondary | ICD-10-CM

## 2023-02-01 DIAGNOSIS — Z79899 Other long term (current) drug therapy: Secondary | ICD-10-CM | POA: Diagnosis not present

## 2023-02-01 DIAGNOSIS — D61818 Other pancytopenia: Secondary | ICD-10-CM

## 2023-02-01 LAB — CBC WITH DIFFERENTIAL (CANCER CENTER ONLY)
Abs Immature Granulocytes: 0.01 10*3/uL (ref 0.00–0.07)
Basophils Absolute: 0 10*3/uL (ref 0.0–0.1)
Basophils Relative: 0 %
Eosinophils Absolute: 0 10*3/uL (ref 0.0–0.5)
Eosinophils Relative: 0 %
HCT: 33.3 % — ABNORMAL LOW (ref 39.0–52.0)
Hemoglobin: 10.8 g/dL — ABNORMAL LOW (ref 13.0–17.0)
Immature Granulocytes: 0 %
Lymphocytes Relative: 48 %
Lymphs Abs: 1.1 10*3/uL (ref 0.7–4.0)
MCH: 25.5 pg — ABNORMAL LOW (ref 26.0–34.0)
MCHC: 32.4 g/dL (ref 30.0–36.0)
MCV: 78.5 fL — ABNORMAL LOW (ref 80.0–100.0)
Monocytes Absolute: 0.7 10*3/uL (ref 0.1–1.0)
Monocytes Relative: 28 %
Neutro Abs: 0.6 10*3/uL — ABNORMAL LOW (ref 1.7–7.7)
Neutrophils Relative %: 24 %
Platelet Count: 21 10*3/uL — ABNORMAL LOW (ref 150–400)
RBC: 4.24 MIL/uL (ref 4.22–5.81)
RDW: 17.2 % — ABNORMAL HIGH (ref 11.5–15.5)
WBC Count: 2.3 10*3/uL — ABNORMAL LOW (ref 4.0–10.5)
nRBC: 0 % (ref 0.0–0.2)

## 2023-02-01 LAB — CMP (CANCER CENTER ONLY)
ALT: 5 U/L (ref 0–44)
AST: 17 U/L (ref 15–41)
Albumin: 4.1 g/dL (ref 3.5–5.0)
Alkaline Phosphatase: 48 U/L (ref 38–126)
Anion gap: 7 (ref 5–15)
BUN: 26 mg/dL — ABNORMAL HIGH (ref 8–23)
CO2: 25 mmol/L (ref 22–32)
Calcium: 8.8 mg/dL — ABNORMAL LOW (ref 8.9–10.3)
Chloride: 107 mmol/L (ref 98–111)
Creatinine: 1.24 mg/dL (ref 0.61–1.24)
GFR, Estimated: 58 mL/min — ABNORMAL LOW (ref >60)
Glucose, Bld: 108 mg/dL — ABNORMAL HIGH (ref 70–99)
Potassium: 4 mmol/L (ref 3.5–5.1)
Sodium: 139 mmol/L (ref 135–145)
Total Bilirubin: 0.9 mg/dL (ref 0.3–1.2)
Total Protein: 6.6 g/dL (ref 6.5–8.1)

## 2023-02-01 MED ORDER — EPOETIN ALFA-EPBX 20000 UNIT/ML IJ SOLN
20000.0000 [IU] | Freq: Once | INTRAMUSCULAR | Status: AC
  Administered 2023-02-01: 20000 [IU] via SUBCUTANEOUS
  Filled 2023-02-01: qty 1

## 2023-02-01 NOTE — Progress Notes (Signed)
[facility] [phone]   [phone]  PROGRESS NOTE  Patient Care Team: Plotnikov, Georgina Quint, MD as PCP - General Parke Poisson, MD as PCP - Cardiology (Cardiology) Jeani Hawking, MD as Consulting Physician (Gastroenterology) [place], Amy, MD as Consulting Physician (Dermatology) Glendale Chard, DO as Consulting Physician (Neurology) Sheran Luz, MD as Consulting Physician (Physical Medicine and Rehabilitation) Durene Romans, MD as Consulting Physician (Orthopedic Surgery) Szabat, Vinnie Level, [facility] (Inactive) as Pharmacist (Pharmacist) Jaci Standard, MD as Consulting Physician (Hematology and Oncology)  Hematological/Oncological History # Pancytopenia #Myelodysplastic Syndrome, Single Lineage Dysplasia 11/18/2021: WBC 2.4, ANC 1.1, Hgb 9.7, MCV 84.0, Plt 58. 12/07/2021: Establish care with Dr. Leonides Schanz and Georga Kaufmann 12/19/2021: Bone marrow biopsy which showed a low-grade myelodysplastic syndrome.  6/28/203: Started retacrit 20,000 units q 2 weeks  Interval History:  Justin [name]. 82 y.o. male with medical history significant for newly diagnosed low-grade myelodysplastic syndrome who presents for a follow up visit. The patient's last visit was on 12/08/2022. In the interim, he continues on retacrit injections q 2 weeks.   On exam today Justin [name] reports he has developed some bruises Nino since our last visits.  He reports he can just bump something ever so slightly and he developed a bruise.  He reports his energy levels are actually all that bad.  He notes that he does feel slow and that he has poor endurance when trying to keep up with his grandchildren.  He notes his appetite has been "okay".  He notes that he enjoys cooking but has not had his most recent interest because he is tired of "eating the same old thing".  He notes that he has not had any overt signs of bleeding, bruising, or dark stools.  Otherwise he is willing and able to  continue with his Retacrit therapy at this time.  His bowel habits are unchanged without recurrent episodes of diarrhea or constipation. He denies fevers, chills, sweats, shortness of breath, chest pain or cough. He has no other complaints.  MEDICAL HISTORY:  Past Medical History:  Diagnosis Date   Alcoholism (HCC)    Dry 13 years   Basal cell carcinoma    Right Chest   BPH (benign prostatic hyperplasia)    Diverticulosis of colon    Dizzy spells    none recent   DJD (degenerative joint disease)    lower back   Elbow fracture, left 2009   Dr Carola Frost   GERD (gastroesophageal reflux disease)    Glaucoma    History of TIAs 1 yrs ago   Hyperkeratosis    LBP (low back pain)    Macular degeneration    left wet right dry sees dr Luciana Axe   Melanoma [facility]) 2009   x3 Dr Amy Swaz[place]and   Osteoarthritis    Restless leg syndrome    Thrombocytopenia (HCC) 11/18/2021   Tinnitus    Tobacco abuse    Uses walker    Wears glasses    reading   Wears partial dentures    upper    SURGICAL HISTORY: Past Surgical History:  Procedure Laterality Date   CATARACT EXTRACTION Bilateral 2018   HARDWARE REMOVAL Left 07/09/2020   Procedure: Left elbow hardware removal;  Surgeon: Yolonda Kida, MD;  Location: [facility];  Service: Orthopedics;  Laterality: Left;  60 mins   mda     Dr. Luciana Axe wet injection   MELANOMA EXCISION  2009   x3  RECONSTRUCTION MEDIAL COLLATERAL LIGAMENT ELBOW W/ TENDON GRAFT  2009   Left, Dr Carola Frost   TONSILLECTOMY  as child   TOTAL KNEE ARTHROPLASTY Right 11/09/2020   Procedure: TOTAL KNEE ARTHROPLASTY;  Surgeon: Durene Romans, MD;  Location: [facility];  Service: Orthopedics;  Laterality: Right;    SOCIAL HISTORY: Social History   Socioeconomic History   Marital status: Married    Spouse name: Not on file   Number of children: 2   Years of education: Not on file   Highest education level: Not on file  Occupational History   Occupation: [place] Veteran  - [employer]    Employer: RETIRED   Occupation: Scientist, research (physical sciences)   Occupation: Games developer  Tobacco Use   Smoking status: Former    Current packs/day: 0.00    Average packs/day: 3.0 packs/day for 38.0 years (114.0 ttl pk-yrs)    Types: Cigarettes    Start date: 49    Quit date: 1986    Years since quitting: 38.6   Smokeless tobacco: Never   Tobacco comments:    states he quit May 15 1985  Vaping Use   Vaping status: Never Used  Substance and Sexual Activity   Alcohol use: No    Comment: PREVIOUS - DRY 15yrs   Drug use: Not Currently   Sexual activity: Not Currently  Other Topics Concern   Not on file  Social History Narrative   Right handed   One story home   Drinks coffee q am   Social Determinants of Health   Financial Resource Strain: Low Risk  (08/07/2022)   Overall Financial Resource Strain (CARDIA)    Difficulty of Paying Living Expenses: Not hard at all  Food Insecurity: No Food Insecurity (08/07/2022)   Hunger Vital Sign    Worried About Running Out of Food in the Last Year: Never true    Ran Out of Food in the Last Year: Never true  Transportation Needs: No Transportation Needs (08/07/2022)   PRAPARE - Administrator, Civil Service (Medical): No    Lack of Transportation (Non-Medical): No  Physical Activity: Inactive (08/07/2022)   Exercise Vital Sign    Days of Exercise per Week: 0 days    Minutes of Exercise per Session: 0 min  Stress: No Stress Concern Present (08/07/2022)   [employer] of Occupational Health - Occupational Stress Questionnaire    Feeling of Stress : Not at all  Social Connections: Moderately Isolated (08/07/2022)   Social Connection and Isolation Panel [NHANES]    Frequency of Communication with Friends and Family: Twice a week    Frequency of Social Gatherings with Friends and Family: Twice a week    Attends Religious Services: Never    Database administrator or Organizations: No    Attends Banker  Meetings: Never    Marital Status: Married  Catering manager Violence: Not At Risk (08/07/2022)   Humiliation, Afraid, Rape, and Kick questionnaire    Fear of Current or Ex-Partner: No    Emotionally Abused: No    Physically Abused: No    Sexually Abused: No    FAMILY HISTORY: Family History  Problem Relation Age of Onset   Cancer Mother        ?breast   Cancer Father        Lung    ALLERGIES:  is allergic to levofloxacin.  MEDICATIONS:  Current Outpatient Medications  Medication Sig Dispense Refill   Acetaminophen (TYLENOL PO) Tylenol  amLODipine (NORVASC) 5 MG tablet Take 1 tablet (5 mg total) by mouth daily. 90 tablet 3   calcium carbonate (TUMS EX) 750 MG chewable tablet Chew 1 tablet by mouth daily.     carbidopa-levodopa (SINEMET IR) 25-100 MG tablet For breakthrough restless leg, you can take 1 tablet at bedtime.  OK to take extra tab as needed during the day. (Patient taking differently: Take 1 tablet by mouth at bedtime.) 100 tablet 3   Cholecalciferol 1000 UNITS tablet Take 3,000 Units by mouth daily.     Cyanocobalamin (VITAMIN B-12) 1000 MCG SUBL DISSOLVE 2 TABLETS IN MOUTH EVERY DAY 180 tablet 1   ferrous sulfate 325 (65 FE) MG tablet Take 650 mg by mouth daily with breakfast.     finasteride (PROSCAR) 5 MG tablet Take 1 tablet (5 mg total) by mouth daily. 90 tablet 1   gabapentin (NEURONTIN) 300 MG capsule Take 1 capsule (300 mg total) by mouth 4 (four) times daily. Follow-up appt due in Sept must see provider for future refills 360 capsule 0   magnesium hydroxide (MILK OF MAGNESIA) 400 MG/5ML suspension Take 15 mLs by mouth daily as needed for mild constipation.     meclizine (ANTIVERT) 12.5 MG tablet TAKE 1 TABLET BY MOUTH 3 TIMES DAILY AS NEEDED FOR DIZZINESS. 90 tablet 2   Multiple Vitamins-Minerals (PRESERVISION AREDS PO) Take 2 tablets by mouth 2 (two) times daily.     Oxycodone HCl 10 MG TABS Take 10 mg by mouth 5 (five) times daily as needed (pain).  Typically only uses 4 times daily     Polyethyl Glycol-Propyl Glycol (SYSTANE OP) Place 1 drop into both eyes 2 (two) times daily as needed (dry eyes). [facility]     pravastatin (PRAVACHOL) 20 MG tablet Take 1 tablet (20 mg total) by mouth daily. 90 tablet 1   rOPINIRole (REQUIP) 2 MG tablet TAKE 1 TABLET BY MOUTH AT AT NOON AND 1 TAB AT 4PM AND 1 TAB AT BEDTIME 270 tablet 1   tamsulosin (FLOMAX) 0.4 MG CAPS capsule Take 1 capsule (0.4 mg total) by mouth in the morning. 90 capsule 1   terazosin (HYTRIN) 2 MG capsule TAKE 1 CAPSULE BY MOUTH EVERYDAY AT BEDTIME 90 capsule 1   No current facility-administered medications for this visit.    REVIEW OF SYSTEMS:   Constitutional: ( - ) fevers, ( - )  chills , ( - ) night sweats Eyes: ( - ) blurriness of vision, ( - ) double vision, ( - ) watery eyes Ears, nose, mouth, throat, and face: ( - ) mucositis, ( - ) sore throat Respiratory: ( - ) cough, ( - ) dyspnea, ( - ) wheezes Cardiovascular: ( - ) palpitation, ( - ) chest discomfort, ( - ) lower extremity swelling Gastrointestinal:  ( - ) nausea, ( - ) heartburn, ( - ) change in bowel habits Skin: ( - ) abnormal skin rashes Lymphatics: ( - ) new lymphadenopathy, ( - ) easy bruising Neurological: ( - ) numbness, ( - ) tingling, ( - ) new weaknesses Behavioral/Psych: ( - ) mood change, ( - ) new changes  All other systems were reviewed with the patient and are negative.  PHYSICAL EXAMINATION:  Vitals:   02/01/23 1117  BP: (!) 146/59  Pulse: 63  Resp: 13  Temp: (!) 97.3 F (36.3 C)  SpO2: 100%     Filed Weights   02/01/23 1117  Weight: 165 lb 9.6 oz (75.1 kg)  GENERAL: Well-appearing elderly Caucasian male, alert, no distress and comfortable SKIN: skin color, texture, turgor are normal, no rashes or significant lesions EYES: conjunctiva are pink and non-injected, sclera clear LUNGS: clear to auscultation and percussion with normal breathing effort HEART: regular rate & rhythm and  no murmurs and no lower extremity edema Musculoskeletal: no cyanosis of digits and no clubbing  PSYCH: alert & oriented x 3, fluent speech NEURO: no focal motor/sensory deficits  LABORATORY DATA:  I have reviewed the data as listed    Latest Ref Rng & Units 02/01/2023   10:20 AM 01/18/2023    8:42 AM 01/05/2023    8:30 AM  CBC  WBC 4.0 - 10.5 K/uL 2.3  2.7  2.4   Hemoglobin 13.0 - 17.0 g/dL 16.1  09.6  04.5   Hematocrit 39.0 - 52.0 % 33.3  33.0  35.0   Platelets 150 - 400 K/uL 21  30  21         Latest Ref Rng & Units 02/01/2023   10:20 AM 01/18/2023    8:42 AM 01/05/2023    8:30 AM  CMP  Glucose 70 - 99 mg/dL 409  811  914   BUN 8 - 23 mg/dL 26  22  23    Creatinine 0.61 - 1.24 mg/dL 7.82  9.56  2.13   Sodium 135 - 145 mmol/L 139  140  142   Potassium 3.5 - 5.1 mmol/L 4.0  4.0  3.7   Chloride 98 - 111 mmol/L 107  108  109   CO2 22 - 32 mmol/L 25  25  25    Calcium 8.9 - 10.3 mg/dL 8.8  9.0  9.2   Total Protein 6.5 - 8.1 g/dL 6.6  6.7  6.3   Total Bilirubin 0.3 - 1.2 mg/dL 0.9  0.7  0.9   Alkaline Phos 38 - 126 U/L 48  55  53   AST 15 - 41 U/L 17  17  15    ALT 0 - 44 U/L 5  <5  <5     Lab Results  Component Value Date   MPROTEIN Not Observed 12/07/2021   Lab Results  Component Value Date   KPAFRELGTCHN 45.3 (H) 12/07/2021   LAMBDASER 28.9 (H) 12/07/2021   KAPLAMBRATIO 1.57 12/07/2021   RADIOGRAPHIC STUDIES: No results found.  ASSESSMENT & PLAN Jie Stickels [place]. Is a 82 y.o. male who returns for a follow up for MDS.     # Myelodysplastic Syndrome, Single Lineage Dysplasia -- At this time Mr. Kampf has a low-grade myelodysplasia that is anemia predominant. --Given that his hemoglobin is consistently less than 10 I would recommend we proceed with erythropoietin therapy. Discussed with the patient other options including hypomethylating therapies and Luspatercept, however given that anemia is his primary issue would recommend erythropoietin to see if this helps to  elevate his other blood counts as well. --No clear signs of nutritional deficiency during our prior evaluation. --MDS FISH study was normal.  --Started retacrit 20,000 units q 2 weeks on 12/28/2021.  PLAN: --Due for retacrit injection today --Labs from today reviewed. Hgb 10.8, MCV 78.5, platelets 21, and white blood cell 2.3. Will continue to monitor as long as platelet count is >20K. --Continue with q 2 week retacrit injections as scheduled --RTC in 8 weeks time to assure he is tolerating the shots   No orders of the defined types were placed in this encounter.   All questions were answered. The patient knows to call the  clinic with any problems, questions or concerns.  A total of more than 25 minutes were spent on this encounter with face-to-face time and non-face-to-face time, including preparing to see the patient, ordering tests and/or medications, counseling the patient and coordination of care as outlined above.   Ulysees Barns, MD Department of Hematology/Oncology [facility] at [facility] Phone: [phone] Pager: [phone] Email: [email or web address]  02/04/2023 5:30 PM

## 2023-02-04 ENCOUNTER — Encounter: Payer: Self-pay | Admitting: Hematology and Oncology

## 2023-02-09 ENCOUNTER — Inpatient Hospital Stay: Payer: PPO

## 2023-02-09 ENCOUNTER — Inpatient Hospital Stay: Payer: PPO | Admitting: Hematology and Oncology

## 2023-02-15 ENCOUNTER — Inpatient Hospital Stay: Payer: PPO

## 2023-02-15 ENCOUNTER — Encounter (INDEPENDENT_AMBULATORY_CARE_PROVIDER_SITE_OTHER): Payer: Self-pay

## 2023-02-15 ENCOUNTER — Other Ambulatory Visit: Payer: Self-pay

## 2023-02-15 DIAGNOSIS — D469 Myelodysplastic syndrome, unspecified: Secondary | ICD-10-CM

## 2023-02-15 LAB — CBC WITH DIFFERENTIAL (CANCER CENTER ONLY)
Abs Immature Granulocytes: 0.01 10*3/uL (ref 0.00–0.07)
Basophils Absolute: 0 10*3/uL (ref 0.0–0.1)
Basophils Relative: 1 %
Eosinophils Absolute: 0 10*3/uL (ref 0.0–0.5)
Eosinophils Relative: 1 %
HCT: 35.9 % — ABNORMAL LOW (ref 39.0–52.0)
Hemoglobin: 11.2 g/dL — ABNORMAL LOW (ref 13.0–17.0)
Immature Granulocytes: 0 %
Lymphocytes Relative: 50 %
Lymphs Abs: 1.1 10*3/uL (ref 0.7–4.0)
MCH: 24.6 pg — ABNORMAL LOW (ref 26.0–34.0)
MCHC: 31.2 g/dL (ref 30.0–36.0)
MCV: 78.9 fL — ABNORMAL LOW (ref 80.0–100.0)
Monocytes Absolute: 0.6 10*3/uL (ref 0.1–1.0)
Monocytes Relative: 27 %
Neutro Abs: 0.5 10*3/uL — ABNORMAL LOW (ref 1.7–7.7)
Neutrophils Relative %: 21 %
Platelet Count: 22 10*3/uL — ABNORMAL LOW (ref 150–400)
RBC: 4.55 MIL/uL (ref 4.22–5.81)
RDW: 16.6 % — ABNORMAL HIGH (ref 11.5–15.5)
WBC Count: 2.3 10*3/uL — ABNORMAL LOW (ref 4.0–10.5)
nRBC: 0 % (ref 0.0–0.2)

## 2023-02-15 LAB — CMP (CANCER CENTER ONLY)
ALT: 5 U/L (ref 0–44)
AST: 18 U/L (ref 15–41)
Albumin: 4.3 g/dL (ref 3.5–5.0)
Alkaline Phosphatase: 58 U/L (ref 38–126)
Anion gap: 6 (ref 5–15)
BUN: 23 mg/dL (ref 8–23)
CO2: 26 mmol/L (ref 22–32)
Calcium: 8.9 mg/dL (ref 8.9–10.3)
Chloride: 107 mmol/L (ref 98–111)
Creatinine: 1.08 mg/dL (ref 0.61–1.24)
GFR, Estimated: >60 mL/min (ref >60)
Glucose, Bld: 81 mg/dL (ref 70–99)
Potassium: 4.2 mmol/L (ref 3.5–5.1)
Sodium: 139 mmol/L (ref 135–145)
Total Bilirubin: 0.8 mg/dL (ref 0.3–1.2)
Total Protein: 7 g/dL (ref 6.5–8.1)

## 2023-02-15 NOTE — Progress Notes (Signed)
Patient Hgb 11.2 today, he does not meet the parameters for his retacrit injection. Patient given copy of labs and stated he understood.

## 2023-02-23 ENCOUNTER — Inpatient Hospital Stay: Payer: PPO

## 2023-03-01 ENCOUNTER — Inpatient Hospital Stay: Payer: PPO

## 2023-03-01 VITALS — BP 126/64 | HR 59 | Temp 98.2°F | Resp 14

## 2023-03-01 DIAGNOSIS — D469 Myelodysplastic syndrome, unspecified: Secondary | ICD-10-CM | POA: Diagnosis not present

## 2023-03-01 LAB — CMP (CANCER CENTER ONLY)
ALT: <5 U/L (ref 0–44)
AST: 15 U/L (ref 15–41)
Albumin: 4 g/dL (ref 3.5–5.0)
Alkaline Phosphatase: 53 U/L (ref 38–126)
Anion gap: 6 (ref 5–15)
BUN: 30 mg/dL — ABNORMAL HIGH (ref 8–23)
CO2: 27 mmol/L (ref 22–32)
Calcium: 8.8 mg/dL — ABNORMAL LOW (ref 8.9–10.3)
Chloride: 108 mmol/L (ref 98–111)
Creatinine: 1.44 mg/dL — ABNORMAL HIGH (ref 0.61–1.24)
GFR, Estimated: 48 mL/min — ABNORMAL LOW (ref >60)
Glucose, Bld: 134 mg/dL — ABNORMAL HIGH (ref 70–99)
Potassium: 4.4 mmol/L (ref 3.5–5.1)
Sodium: 141 mmol/L (ref 135–145)
Total Bilirubin: 0.6 mg/dL (ref 0.3–1.2)
Total Protein: 6.7 g/dL (ref 6.5–8.1)

## 2023-03-01 LAB — CBC WITH DIFFERENTIAL (CANCER CENTER ONLY)
Abs Immature Granulocytes: 0.02 10*3/uL (ref 0.00–0.07)
Basophils Absolute: 0 10*3/uL (ref 0.0–0.1)
Basophils Relative: 1 %
Eosinophils Absolute: 0 10*3/uL (ref 0.0–0.5)
Eosinophils Relative: 0 %
HCT: 31.6 % — ABNORMAL LOW (ref 39.0–52.0)
Hemoglobin: 9.9 g/dL — ABNORMAL LOW (ref 13.0–17.0)
Immature Granulocytes: 1 %
Lymphocytes Relative: 30 %
Lymphs Abs: 1.2 10*3/uL (ref 0.7–4.0)
MCH: 24.8 pg — ABNORMAL LOW (ref 26.0–34.0)
MCHC: 31.3 g/dL (ref 30.0–36.0)
MCV: 79 fL — ABNORMAL LOW (ref 80.0–100.0)
Monocytes Absolute: 1 10*3/uL (ref 0.1–1.0)
Monocytes Relative: 25 %
Neutro Abs: 1.7 10*3/uL (ref 1.7–7.7)
Neutrophils Relative %: 43 %
Platelet Count: 38 10*3/uL — ABNORMAL LOW (ref 150–400)
RBC: 4 MIL/uL — ABNORMAL LOW (ref 4.22–5.81)
RDW: 16.6 % — ABNORMAL HIGH (ref 11.5–15.5)
WBC Count: 3.9 10*3/uL — ABNORMAL LOW (ref 4.0–10.5)
nRBC: 0 % (ref 0.0–0.2)

## 2023-03-01 MED ORDER — EPOETIN ALFA-EPBX 20000 UNIT/ML IJ SOLN
20000.0000 [IU] | Freq: Once | INTRAMUSCULAR | Status: AC
  Administered 2023-03-01: 20000 [IU] via SUBCUTANEOUS
  Filled 2023-03-01: qty 1

## 2023-03-04 DIAGNOSIS — L03113 Cellulitis of right upper limb: Secondary | ICD-10-CM | POA: Diagnosis not present

## 2023-03-06 ENCOUNTER — Ambulatory Visit: Payer: PPO | Admitting: Podiatry

## 2023-03-06 DIAGNOSIS — M79674 Pain in right toe(s): Secondary | ICD-10-CM | POA: Diagnosis not present

## 2023-03-06 DIAGNOSIS — M79675 Pain in left toe(s): Secondary | ICD-10-CM | POA: Diagnosis not present

## 2023-03-06 DIAGNOSIS — B351 Tinea unguium: Secondary | ICD-10-CM | POA: Diagnosis not present

## 2023-03-06 NOTE — Progress Notes (Signed)
Subjective: No chief complaint on file.   82 y.o. returns the office today for painful, elongated, thickened toenails which he cannot trim himself and also a callus at the tip of the second toe.  No open lesions, swelling, redness or signs of infection he reports.   PCP: Tresa Garter, MD Last seen 11/21/2022  Objective: AAO 3, NAD DP/PT pulses palpable, CRT less than 3 seconds Neurological status is unchanged.  Nails hypertrophic, dystrophic, elongated, brittle, discolored 10. There is tenderness overlying the nails 1-5 bilaterally. There is no surrounding erythema or drainage along the nail sites. Hyperkeratotic lesion left distal second toe without any underlying ulceration drainage or signs of infection. Hammertoes are present. No pain with calf compression, swelling, warmth, erythema.  Assessment: Patient presents with symptomatic onychomycosis; skin fissure.   Plan: -Treatment options including alternatives, risks, complications were discussed -Nails sharply debrided 10 without complication/bleeding. -Hyperkeratotic lesion sharply debrided x 1 without any complications.  Continue offloading pads. -Neuropathy is stable, will continue to monitor  -Monitor for any clinical signs or symptoms of infection and directed to call the office immediately should any occur or go to the ER.  Vivi Barrack DPM

## 2023-03-08 DIAGNOSIS — H402211 Chronic angle-closure glaucoma, right eye, mild stage: Secondary | ICD-10-CM | POA: Diagnosis not present

## 2023-03-08 DIAGNOSIS — H402222 Chronic angle-closure glaucoma, left eye, moderate stage: Secondary | ICD-10-CM | POA: Diagnosis not present

## 2023-03-09 ENCOUNTER — Inpatient Hospital Stay: Payer: PPO

## 2023-03-15 ENCOUNTER — Inpatient Hospital Stay: Payer: PPO

## 2023-03-15 ENCOUNTER — Inpatient Hospital Stay: Payer: PPO | Attending: Physician Assistant

## 2023-03-15 VITALS — BP 152/73 | HR 66 | Temp 97.9°F | Resp 15

## 2023-03-15 DIAGNOSIS — D469 Myelodysplastic syndrome, unspecified: Secondary | ICD-10-CM

## 2023-03-15 LAB — CMP (CANCER CENTER ONLY)
ALT: 5 U/L (ref 0–44)
AST: 17 U/L (ref 15–41)
Albumin: 4.2 g/dL (ref 3.5–5.0)
Alkaline Phosphatase: 52 U/L (ref 38–126)
Anion gap: 5 (ref 5–15)
BUN: 22 mg/dL (ref 8–23)
CO2: 28 mmol/L (ref 22–32)
Calcium: 9.4 mg/dL (ref 8.9–10.3)
Chloride: 108 mmol/L (ref 98–111)
Creatinine: 1.3 mg/dL — ABNORMAL HIGH (ref 0.61–1.24)
GFR, Estimated: 55 mL/min — ABNORMAL LOW (ref >60)
Glucose, Bld: 108 mg/dL — ABNORMAL HIGH (ref 70–99)
Potassium: 3.9 mmol/L (ref 3.5–5.1)
Sodium: 141 mmol/L (ref 135–145)
Total Bilirubin: 0.8 mg/dL (ref 0.3–1.2)
Total Protein: 6.8 g/dL (ref 6.5–8.1)

## 2023-03-15 LAB — CBC WITH DIFFERENTIAL (CANCER CENTER ONLY)
Abs Immature Granulocytes: 0.01 10*3/uL (ref 0.00–0.07)
Basophils Absolute: 0 10*3/uL (ref 0.0–0.1)
Basophils Relative: 0 %
Eosinophils Absolute: 0 10*3/uL (ref 0.0–0.5)
Eosinophils Relative: 1 %
HCT: 33.9 % — ABNORMAL LOW (ref 39.0–52.0)
Hemoglobin: 10.6 g/dL — ABNORMAL LOW (ref 13.0–17.0)
Immature Granulocytes: 0 %
Lymphocytes Relative: 50 %
Lymphs Abs: 1.3 10*3/uL (ref 0.7–4.0)
MCH: 25.2 pg — ABNORMAL LOW (ref 26.0–34.0)
MCHC: 31.3 g/dL (ref 30.0–36.0)
MCV: 80.5 fL (ref 80.0–100.0)
Monocytes Absolute: 0.7 10*3/uL (ref 0.1–1.0)
Monocytes Relative: 26 %
Neutro Abs: 0.6 10*3/uL — ABNORMAL LOW (ref 1.7–7.7)
Neutrophils Relative %: 23 %
Platelet Count: 35 10*3/uL — ABNORMAL LOW (ref 150–400)
RBC: 4.21 MIL/uL — ABNORMAL LOW (ref 4.22–5.81)
RDW: 17.5 % — ABNORMAL HIGH (ref 11.5–15.5)
WBC Count: 2.6 10*3/uL — ABNORMAL LOW (ref 4.0–10.5)
nRBC: 0 % (ref 0.0–0.2)

## 2023-03-15 MED ORDER — EPOETIN ALFA-EPBX 20000 UNIT/ML IJ SOLN
20000.0000 [IU] | Freq: Once | INTRAMUSCULAR | Status: AC
  Administered 2023-03-15: 20000 [IU] via SUBCUTANEOUS
  Filled 2023-03-15: qty 1

## 2023-03-21 ENCOUNTER — Other Ambulatory Visit: Payer: Self-pay | Admitting: Internal Medicine

## 2023-03-23 ENCOUNTER — Inpatient Hospital Stay: Payer: PPO

## 2023-03-28 DIAGNOSIS — H43812 Vitreous degeneration, left eye: Secondary | ICD-10-CM | POA: Diagnosis not present

## 2023-03-28 DIAGNOSIS — H353221 Exudative age-related macular degeneration, left eye, with active choroidal neovascularization: Secondary | ICD-10-CM | POA: Diagnosis not present

## 2023-03-28 DIAGNOSIS — H353111 Nonexudative age-related macular degeneration, right eye, early dry stage: Secondary | ICD-10-CM | POA: Diagnosis not present

## 2023-03-28 DIAGNOSIS — H401130 Primary open-angle glaucoma, bilateral, stage unspecified: Secondary | ICD-10-CM | POA: Diagnosis not present

## 2023-03-29 ENCOUNTER — Ambulatory Visit: Payer: PPO | Admitting: Internal Medicine

## 2023-03-30 ENCOUNTER — Inpatient Hospital Stay: Payer: PPO

## 2023-03-30 ENCOUNTER — Telehealth: Payer: Self-pay | Admitting: Hematology and Oncology

## 2023-04-06 ENCOUNTER — Inpatient Hospital Stay: Payer: PPO

## 2023-04-06 ENCOUNTER — Inpatient Hospital Stay: Payer: PPO | Admitting: Hematology and Oncology

## 2023-04-11 ENCOUNTER — Ambulatory Visit (INDEPENDENT_AMBULATORY_CARE_PROVIDER_SITE_OTHER): Payer: PPO | Admitting: Internal Medicine

## 2023-04-11 ENCOUNTER — Encounter: Payer: Self-pay | Admitting: Internal Medicine

## 2023-04-11 VITALS — BP 122/70 | HR 70 | Temp 98.6°F | Ht 68.0 in | Wt 167.0 lb

## 2023-04-11 DIAGNOSIS — I2510 Atherosclerotic heart disease of native coronary artery without angina pectoris: Secondary | ICD-10-CM | POA: Diagnosis not present

## 2023-04-11 DIAGNOSIS — I10 Essential (primary) hypertension: Secondary | ICD-10-CM | POA: Diagnosis not present

## 2023-04-11 DIAGNOSIS — G629 Polyneuropathy, unspecified: Secondary | ICD-10-CM

## 2023-04-11 DIAGNOSIS — E538 Deficiency of other specified B group vitamins: Secondary | ICD-10-CM

## 2023-04-11 DIAGNOSIS — G8929 Other chronic pain: Secondary | ICD-10-CM | POA: Diagnosis not present

## 2023-04-11 DIAGNOSIS — G2581 Restless legs syndrome: Secondary | ICD-10-CM | POA: Diagnosis not present

## 2023-04-11 DIAGNOSIS — D469 Myelodysplastic syndrome, unspecified: Secondary | ICD-10-CM | POA: Diagnosis not present

## 2023-04-11 DIAGNOSIS — M545 Low back pain, unspecified: Secondary | ICD-10-CM

## 2023-04-11 NOTE — Assessment & Plan Note (Signed)
Cont w/Requip, gabapentin, Sinemet ?

## 2023-04-11 NOTE — Progress Notes (Signed)
Subjective:  Patient ID: Justin Crane [name], male    DOB: 06-08-1941  Age: 82 y.o. MRN: [id number]  CC: Follow-up (6 MNTH F/U)   HPI Justin [name]. presents for LBP, RLS, HTN, dyslipidemia, MDS  Outpatient Medications Prior to Visit  Medication Sig Dispense Refill   Acetaminophen (TYLENOL PO) Tylenol     amLODipine (NORVASC) 5 MG tablet TAKE 1 TABLET BY MOUTH DAILY 90 tablet 3   calcium carbonate (TUMS EX) 750 MG chewable tablet Chew 1 tablet by mouth daily.     carbidopa-levodopa (SINEMET IR) 25-100 MG tablet For breakthrough restless leg, you can take 1 tablet at bedtime.  OK to take extra tab as needed during the day. (Patient taking differently: Take 1 tablet by mouth at bedtime.) 100 tablet 3   Cholecalciferol 1000 UNITS tablet Take 3,000 Units by mouth daily.     Cyanocobalamin (VITAMIN B-12) 1000 MCG SUBL DISSOLVE 2 TABLETS IN MOUTH EVERY DAY 180 tablet 1   ferrous sulfate 325 (65 FE) MG tablet Take 650 mg by mouth daily with breakfast.     finasteride (PROSCAR) 5 MG tablet Take 1 tablet (5 mg total) by mouth daily. 90 tablet 1   gabapentin (NEURONTIN) 300 MG capsule Take 1 capsule (300 mg total) by mouth 4 (four) times daily. Follow-up appt due in Sept must see provider for future refills 360 capsule 0   magnesium hydroxide (MILK OF MAGNESIA) 400 MG/5ML suspension Take 15 mLs by mouth daily as needed for mild constipation.     meclizine (ANTIVERT) 12.5 MG tablet TAKE 1 TABLET BY MOUTH 3 TIMES DAILY AS NEEDED FOR DIZZINESS. 90 tablet 2   Multiple Vitamins-Minerals (PRESERVISION AREDS PO) Take 2 tablets by mouth 2 (two) times daily.     Oxycodone HCl 10 MG TABS Take 10 mg by mouth 5 (five) times daily as needed (pain). Typically only uses 4 times daily     Polyethyl Glycol-Propyl Glycol (SYSTANE OP) Place 1 drop into both eyes 2 (two) times daily as needed (dry eyes). [facility]     pravastatin (PRAVACHOL) 20 MG tablet Take 1 tablet (20 mg total) by mouth daily. 90 tablet 1    rOPINIRole (REQUIP) 2 MG tablet TAKE 1 TABLET BY MOUTH AT AT NOON AND 1 TAB AT 4PM AND 1 TAB AT BEDTIME 270 tablet 1   tamsulosin (FLOMAX) 0.4 MG CAPS capsule Take 1 capsule (0.4 mg total) by mouth in the morning. 90 capsule 1   terazosin (HYTRIN) 2 MG capsule TAKE 1 CAPSULE BY MOUTH EVERYDAY AT BEDTIME 90 capsule 1   No facility-administered medications prior to visit.    ROS: Review of Systems  Constitutional:  Positive for fatigue. Negative for appetite change and unexpected weight change.  HENT:  Negative for congestion, nosebleeds, sneezing, sore throat and trouble swallowing.   Eyes:  Negative for itching and visual disturbance.  Respiratory:  Negative for cough.   Cardiovascular:  Negative for chest pain, palpitations and leg swelling.  Gastrointestinal:  Negative for abdominal distention, blood in stool, diarrhea and nausea.  Genitourinary:  Negative for frequency and hematuria.  Musculoskeletal:  Positive for arthralgias, back pain and gait problem. Negative for joint swelling and neck pain.  Skin:  Negative for rash.  Neurological:  Negative for dizziness, tremors, speech difficulty and weakness.  Hematological:  Bruises/bleeds easily.  Psychiatric/Behavioral:  Negative for agitation, decreased concentration, dysphoric mood, sleep disturbance and suicidal ideas. The patient is not nervous/anxious.     Objective:  BP 122/70 (BP Location: Right Arm, Patient Position: Sitting, Cuff Size: Normal)   Pulse 70   Temp 98.6 F (37 C) (Oral)   Ht 5\' 8"  (1.727 m)   Wt 167 lb (75.8 kg)   SpO2 94%   BMI 25.39 kg/m   BP Readings from Last 3 Encounters:  04/11/23 122/70  03/15/23 (!) 152/73  03/01/23 126/64    Wt Readings from Last 3 Encounters:  04/11/23 167 lb (75.8 kg)  02/01/23 165 lb 9.6 oz (75.1 kg)  12/22/22 166 lb 8 oz (75.5 kg)    Physical Exam Constitutional:      General: He is not in acute distress.    Appearance: Normal appearance. He is well-developed.      Comments: NAD  Eyes:     Conjunctiva/sclera: Conjunctivae normal.     Pupils: Pupils are equal, round, and reactive to light.  Neck:     Thyroid: No thyromegaly.     Vascular: No JVD.  Cardiovascular:     Rate and Rhythm: Normal rate and regular rhythm.     Heart sounds: Normal heart sounds. No murmur heard.    No friction rub. No gallop.  Pulmonary:     Effort: Pulmonary effort is normal. No respiratory distress.     Breath sounds: Normal breath sounds. No wheezing or rales.  Chest:     Chest wall: No tenderness.  Abdominal:     General: Bowel sounds are normal. There is no distension.     Palpations: Abdomen is soft. There is no mass.     Tenderness: There is no abdominal tenderness. There is no guarding or rebound.  Musculoskeletal:        General: No tenderness. Normal range of motion.     Cervical back: Normal range of motion.     Right lower leg: No edema.     Left lower leg: No edema.  Lymphadenopathy:     Cervical: No cervical adenopathy.  Skin:    General: Skin is warm and dry.     Findings: No rash.  Neurological:     Mental Status: He is alert and oriented to person, place, and time.     Cranial Nerves: No cranial nerve deficit.     Motor: No abnormal muscle tone.     Coordination: Coordination normal.     Gait: Gait normal.     Deep Tendon Reflexes: Reflexes are normal and symmetric.  Psychiatric:        Behavior: Behavior normal.        Thought Content: Thought content normal.        Judgment: Judgment normal.     Lab Results  Component Value Date   WBC 2.6 (L) 03/15/2023   HGB 10.6 (L) 03/15/2023   HCT 33.9 (L) 03/15/2023   PLT 35 (L) 03/15/2023   GLUCOSE 108 (H) 03/15/2023   CHOL 136 01/12/2017   TRIG 67.0 01/12/2017   HDL 54.70 01/12/2017   LDLCALC 68 01/12/2017   ALT 5 03/15/2023   AST 17 03/15/2023   NA 141 03/15/2023   K 3.9 03/15/2023   CL 108 03/15/2023   CREATININE 1.30 (H) 03/15/2023   BUN 22 03/15/2023   CO2 28 03/15/2023   TSH  3.05 10/16/2019   PSA 0.47 08/30/2016   INR 1.1 10/28/2020   HGBA1C 5.3 04/19/2021    MR CHEST W WO CONTRAST  Result Date: 11/01/2022 CLINICAL DATA:  Chest wall mass, ultrasound/x-ray nondiagnostic. Patient complains of a mass involving the  posterior left chest for months. EXAM: MR CHEST WITH AND WITHOUT CONTRAST TECHNIQUE: Multiplanar, multisequence MR imaging of the chest was performed before and after the administration of intravenous contrast. CONTRAST:  8 cc V[employer]way COMPARISON:  Chest radiographs 09/27/2022. Cardiac CT 07/23/2018. No ultrasound available. FINDINGS: Despite efforts by the technologist and patient, mild motion artifact is present on today's exam and could not be eliminated. This reduces exam sensitivity and specificity. Examination is targeted to the posterior aspect of the left chest at the patient's palpable concern. Capsules were placed around the palpable concern. The entire chest was not imaged. Bones/Joint/Cartilage: There are probable healing posterior left rib fractures. These fractures appear to involve the posterior aspects of the left 10th and 11th ribs, and possibly the 12th rib. The 10th rib fracture is associated with marrow and surrounding soft tissue edema as well as enhancement. The 11th rib fracture demonstrates only minimal edema. No associated chest wall mass or fluid collection identified. There is a moderate convex left thoracolumbar scoliosis. Ligaments: No significant ligamentous abnormalities. Muscles and Tendons: Mild edema and enhancement in the soft tissue surrounding the apparent left 10th rib fracture. No other focal muscular abnormalities are identified. Soft tissue: No soft tissue masses or fluid collections are seen posteriorly in the left chest wall. No suspicious findings in the visualized upper abdomen. There are small renal cysts bilaterally for which no follow-up imaging is recommended. Trace left pleural effusion. IMPRESSION: 1. Probable healing  posterior left rib fractures involving the left 10th-12th ribs, presumably accounting for the patient's palpable concern. The 10th rib fracture is associated with marrow and surrounding soft tissue edema and enhancement. These could be confirmed with follow-up radiographs if in consistent with the clinical impression. 2. No associated chest wall mass or fluid collection identified. 3. Underlying moderate convex left thoracolumbar scoliosis. Electronically Signed   By: Carey Bullocks M.D.   On: 11/01/2022 14:26    Assessment & Plan:   Problem List Items Addressed This Visit     RLS (restless legs syndrome) - Primary     Cont w/Requip, gabapentin, Sinemet      Relevant Orders   TSH   Urinalysis   Neuropathy    Not better.  Will try gabapentin 300 mg breakfast, 300 mg lunch, 600 mg bedtime      Low back pain    On Oxycodone per Dr Ethelene Hal  Potential benefits of a long term opioids use as well as potential risks (i.e. addiction risk, apnea etc) and complications (i.e. Somnolence, constipation and others) were explained to the patient and were aknowledged.      Vitamin B12 deficiency    On Vit B12      HTN (hypertension)    On Hytrin, Amlodipine      Relevant Orders   TSH   Urinalysis   Coronary artery disease    No angina       MDS (myelodysplastic syndrome) (HCC)    F/u w/Dr Leonides Schanz Labs q 2 weeks         No orders of the defined types were placed in this encounter.     Follow-up: Return in about 6 months (around 10/10/2023) for Wellness Exam.  Sonda Primes, MD

## 2023-04-11 NOTE — Assessment & Plan Note (Signed)
F/u w/Dr Lorenso Courier Labs q 2 weeks

## 2023-04-11 NOTE — Assessment & Plan Note (Signed)
Not better.  Will try gabapentin 300 mg breakfast, 300 mg lunch, 600 mg bedtime

## 2023-04-11 NOTE — Assessment & Plan Note (Signed)
No angina 

## 2023-04-11 NOTE — Assessment & Plan Note (Signed)
On Vit B12 

## 2023-04-11 NOTE — Assessment & Plan Note (Signed)
On Hytrin, Amlodipine

## 2023-04-11 NOTE — Assessment & Plan Note (Signed)
On Oxycodone per Dr Nelva Bush  Potential benefits of a long term opioids use as well as potential risks (i.e. addiction risk, apnea etc) and complications (i.e. Somnolence, constipation and others) were explained to the patient and were aknowledged.

## 2023-04-12 ENCOUNTER — Inpatient Hospital Stay: Payer: PPO | Admitting: Hematology and Oncology

## 2023-04-12 ENCOUNTER — Inpatient Hospital Stay: Payer: PPO | Attending: Physician Assistant

## 2023-04-12 ENCOUNTER — Inpatient Hospital Stay: Payer: PPO

## 2023-04-12 VITALS — HR 71

## 2023-04-12 VITALS — BP 135/67 | HR 47 | Temp 97.5°F | Resp 16 | Wt 166.1 lb

## 2023-04-12 DIAGNOSIS — D469 Myelodysplastic syndrome, unspecified: Secondary | ICD-10-CM

## 2023-04-12 DIAGNOSIS — Z79899 Other long term (current) drug therapy: Secondary | ICD-10-CM | POA: Diagnosis not present

## 2023-04-12 DIAGNOSIS — D61818 Other pancytopenia: Secondary | ICD-10-CM | POA: Diagnosis not present

## 2023-04-12 LAB — CBC WITH DIFFERENTIAL (CANCER CENTER ONLY)
Abs Immature Granulocytes: 0.01 10*3/uL (ref 0.00–0.07)
Basophils Absolute: 0 10*3/uL (ref 0.0–0.1)
Basophils Relative: 1 %
Eosinophils Absolute: 0 10*3/uL (ref 0.0–0.5)
Eosinophils Relative: 1 %
HCT: 32.8 % — ABNORMAL LOW (ref 39.0–52.0)
Hemoglobin: 10.5 g/dL — ABNORMAL LOW (ref 13.0–17.0)
Immature Granulocytes: 0 %
Lymphocytes Relative: 33 %
Lymphs Abs: 0.8 10*3/uL (ref 0.7–4.0)
MCH: 25.8 pg — ABNORMAL LOW (ref 26.0–34.0)
MCHC: 32 g/dL (ref 30.0–36.0)
MCV: 80.6 fL (ref 80.0–100.0)
Monocytes Absolute: 0.9 10*3/uL (ref 0.1–1.0)
Monocytes Relative: 34 %
Neutro Abs: 0.8 10*3/uL — ABNORMAL LOW (ref 1.7–7.7)
Neutrophils Relative %: 31 %
Platelet Count: 25 10*3/uL — ABNORMAL LOW (ref 150–400)
RBC: 4.07 MIL/uL — ABNORMAL LOW (ref 4.22–5.81)
RDW: 16.8 % — ABNORMAL HIGH (ref 11.5–15.5)
WBC Count: 2.5 10*3/uL — ABNORMAL LOW (ref 4.0–10.5)
nRBC: 0 % (ref 0.0–0.2)

## 2023-04-12 LAB — CMP (CANCER CENTER ONLY)
ALT: 5 U/L (ref 0–44)
AST: 17 U/L (ref 15–41)
Albumin: 4.2 g/dL (ref 3.5–5.0)
Alkaline Phosphatase: 54 U/L (ref 38–126)
Anion gap: 7 (ref 5–15)
BUN: 21 mg/dL (ref 8–23)
CO2: 27 mmol/L (ref 22–32)
Calcium: 9.3 mg/dL (ref 8.9–10.3)
Chloride: 106 mmol/L (ref 98–111)
Creatinine: 1.26 mg/dL — ABNORMAL HIGH (ref 0.61–1.24)
GFR, Estimated: 57 mL/min — ABNORMAL LOW (ref >60)
Glucose, Bld: 118 mg/dL — ABNORMAL HIGH (ref 70–99)
Potassium: 3.8 mmol/L (ref 3.5–5.1)
Sodium: 140 mmol/L (ref 135–145)
Total Bilirubin: 0.8 mg/dL (ref 0.3–1.2)
Total Protein: 6.9 g/dL (ref 6.5–8.1)

## 2023-04-12 MED ORDER — EPOETIN ALFA-EPBX 20000 UNIT/ML IJ SOLN
20000.0000 [IU] | Freq: Once | INTRAMUSCULAR | Status: AC
  Administered 2023-04-12: 20000 [IU] via SUBCUTANEOUS
  Filled 2023-04-12: qty 1

## 2023-04-12 NOTE — Progress Notes (Signed)
[facility] [phone]   [phone]  PROGRESS NOTE  Patient Care Team: Plotnikov, Georgina Quint, MD as PCP - General Parke Poisson, MD as PCP - Cardiology (Cardiology) Jeani Hawking, MD as Consulting Physician (Gastroenterology) [place], Amy, MD as Consulting Physician (Dermatology) Glendale Chard, DO as Consulting Physician (Neurology) Sheran Luz, MD as Consulting Physician (Physical Medicine and Rehabilitation) Durene Romans, MD as Consulting Physician (Orthopedic Surgery) Szabat, Vinnie Level, [facility] (Inactive) as Pharmacist (Pharmacist) Jaci Standard, MD as Consulting Physician (Hematology and Oncology)  Hematological/Oncological History # Pancytopenia #Myelodysplastic Syndrome, Single Lineage Dysplasia 11/18/2021: WBC 2.4, ANC 1.1, Hgb 9.7, MCV 84.0, Plt 58. 12/07/2021: Establish care with Dr. Leonides Schanz and Georga Kaufmann 12/19/2021: Bone marrow biopsy which showed a low-grade myelodysplastic syndrome.  6/28/203: Started retacrit 20,000 units q 2 weeks  Interval History:  Tyreque Finken [name] 82 y.o. male with medical history significant for newly diagnosed low-grade myelodysplastic syndrome who presents for a follow up visit. The patient's last visit was on 02/01/2023. In the interim, he continues on retacrit injections q 2 weeks.   On exam today Mr. Laube reports he unfortunately missed his last visit for an injection as his son was in the hospital with a blood infection.  The patient reports has been 4 weeks since his last injection but is delighted to hear that his blood counts have remained stable.  He notes that he is quite prone to bruising but is not had any overt bleeding such as nosebleeds, gum bleeding, or dark stools.  He reports that he does get a cut or nick it does bleed more than usual.  He reports that he has not had any infectious symptoms such as runny nose, sore throat, or cough.  He reports his energy levels are fairly good but he  has poor endurance and gets fatigued easily.  His weight has been stable and otherwise he has been at his baseline level of health. He denies fevers, chills, sweats, shortness of breath, chest pain or cough. He has no other complaints.  MEDICAL HISTORY:  Past Medical History:  Diagnosis Date   Alcoholism (HCC)    Dry 13 years   Basal cell carcinoma    Right Chest   BPH (benign prostatic hyperplasia)    Diverticulosis of colon    Dizzy spells    none recent   DJD (degenerative joint disease)    lower back   Elbow fracture, left 2009   Dr Carola Frost   GERD (gastroesophageal reflux disease)    Glaucoma    History of TIAs 1 yrs ago   Hyperkeratosis    LBP (low back pain)    Macular degeneration    left wet right dry sees dr Luciana Axe   Melanoma [facility]) 2009   x3 Dr Amy [place]   Osteoarthritis    Restless leg syndrome    Thrombocytopenia (HCC) 11/18/2021   Tinnitus    Tobacco abuse    Uses walker    Wears glasses    reading   Wears partial dentures    upper    SURGICAL HISTORY: Past Surgical History:  Procedure Laterality Date   CATARACT EXTRACTION Bilateral 2018   HARDWARE REMOVAL Left 07/09/2020   Procedure: Left elbow hardware removal;  Surgeon: Yolonda Kida, MD;  Location: [facility];  Service: Orthopedics;  Laterality: Left;  60 mins   mda     Dr. Luciana Axe wet injection   MELANOMA EXCISION  2009   x3  RECONSTRUCTION MEDIAL COLLATERAL LIGAMENT ELBOW W/ TENDON GRAFT  2009   Left, Dr Carola Frost   TONSILLECTOMY  as child   TOTAL KNEE ARTHROPLASTY Right 11/09/2020   Procedure: TOTAL KNEE ARTHROPLASTY;  Surgeon: Durene Romans, MD;  Location: [facility];  Service: Orthopedics;  Laterality: Right;    SOCIAL HISTORY: Social History   Socioeconomic History   Marital status: Married    Spouse name: Not on file   Number of children: 2   Years of education: Not on file   Highest education level: Not on file  Occupational History   Occupation: [place] Veteran -  [employer]    Employer: RETIRED   Occupation: Scientist, research (physical sciences)   Occupation: Games developer  Tobacco Use   Smoking status: Former    Current packs/day: 0.00    Average packs/day: 3.0 packs/day for 38.0 years (114.0 ttl pk-yrs)    Types: Cigarettes    Start date: 53    Quit date: 1986    Years since quitting: 38.8   Smokeless tobacco: Never   Tobacco comments:    states he quit May 15 1985  Vaping Use   Vaping status: Never Used  Substance and Sexual Activity   Alcohol use: No    Comment: PREVIOUS - DRY 56yrs   Drug use: Not Currently   Sexual activity: Not Currently  Other Topics Concern   Not on file  Social History Narrative   Right handed   One story home   Drinks coffee q am   Social Determinants of Health   Financial Resource Strain: Low Risk  (08/07/2022)   Overall Financial Resource Strain (CARDIA)    Difficulty of Paying Living Expenses: Not hard at all  Food Insecurity: No Food Insecurity (08/07/2022)   Hunger Vital Sign    Worried About Running Out of Food in the Last Year: Never true    Ran Out of Food in the Last Year: Never true  Transportation Needs: No Transportation Needs (08/07/2022)   PRAPARE - Administrator, Civil Service (Medical): No    Lack of Transportation (Non-Medical): No  Physical Activity: Inactive (08/07/2022)   Exercise Vital Sign    Days of Exercise per Week: 0 days    Minutes of Exercise per Session: 0 min  Stress: No Stress Concern Present (08/07/2022)   [employer] of Occupational Health - Occupational Stress Questionnaire    Feeling of Stress : Not at all  Social Connections: Moderately Isolated (08/07/2022)   Social Connection and Isolation Panel [NHANES]    Frequency of Communication with Friends and Family: Twice a week    Frequency of Social Gatherings with Friends and Family: Twice a week    Attends Religious Services: Never    Database administrator or Organizations: No    Attends Banker  Meetings: Never    Marital Status: Married  Catering manager Violence: Not At Risk (08/07/2022)   Humiliation, Afraid, Rape, and Kick questionnaire    Fear of Current or Ex-Partner: No    Emotionally Abused: No    Physically Abused: No    Sexually Abused: No    FAMILY HISTORY: Family History  Problem Relation Age of Onset   Cancer Mother        ?breast   Cancer Father        Lung    ALLERGIES:  is allergic to levofloxacin.  MEDICATIONS:  Current Outpatient Medications  Medication Sig Dispense Refill   Acetaminophen (TYLENOL PO) Tylenol  amLODipine (NORVASC) 5 MG tablet TAKE 1 TABLET BY MOUTH DAILY 90 tablet 3   calcium carbonate (TUMS EX) 750 MG chewable tablet Chew 1 tablet by mouth daily.     carbidopa-levodopa (SINEMET IR) 25-100 MG tablet For breakthrough restless leg, you can take 1 tablet at bedtime.  OK to take extra tab as needed during the day. (Patient taking differently: Take 1 tablet by mouth at bedtime.) 100 tablet 3   Cholecalciferol 1000 UNITS tablet Take 3,000 Units by mouth daily.     Cyanocobalamin (VITAMIN B-12) 1000 MCG SUBL DISSOLVE 2 TABLETS IN MOUTH EVERY DAY 180 tablet 1   ferrous sulfate 325 (65 FE) MG tablet Take 650 mg by mouth daily with breakfast.     finasteride (PROSCAR) 5 MG tablet Take 1 tablet (5 mg total) by mouth daily. 90 tablet 1   gabapentin (NEURONTIN) 300 MG capsule Take 1 capsule (300 mg total) by mouth 4 (four) times daily. Follow-up appt due in Sept must see provider for future refills 360 capsule 0   magnesium hydroxide (MILK OF MAGNESIA) 400 MG/5ML suspension Take 15 mLs by mouth daily as needed for mild constipation.     meclizine (ANTIVERT) 12.5 MG tablet TAKE 1 TABLET BY MOUTH 3 TIMES DAILY AS NEEDED FOR DIZZINESS. 90 tablet 2   Multiple Vitamins-Minerals (PRESERVISION AREDS PO) Take 2 tablets by mouth 2 (two) times daily.     Oxycodone HCl 10 MG TABS Take 10 mg by mouth 5 (five) times daily as needed (pain). Typically only uses 4  times daily     Polyethyl Glycol-Propyl Glycol (SYSTANE OP) Place 1 drop into both eyes 2 (two) times daily as needed (dry eyes). [facility] O[facility]     pravastatin (PRAVACHOL) 20 MG tablet Take 1 tablet (20 mg total) by mouth daily. 90 tablet 1   rOPINIRole (REQUIP) 2 MG tablet TAKE 1 TABLET BY MOUTH AT AT NOON AND 1 TAB AT 4PM AND 1 TAB AT BEDTIME 270 tablet 1   tamsulosin (FLOMAX) 0.4 MG CAPS capsule Take 1 capsule (0.4 mg total) by mouth in the morning. 90 capsule 1   terazosin (HYTRIN) 2 MG capsule TAKE 1 CAPSULE BY MOUTH EVERYDAY AT BEDTIME 90 capsule 1   No current facility-administered medications for this visit.    REVIEW OF SYSTEMS:   Constitutional: ( - ) fevers, ( - )  chills , ( - ) night sweats Eyes: ( - ) blurriness of vision, ( - ) double vision, ( - ) watery eyes Ears, nose, mouth, throat, and face: ( - ) mucositis, ( - ) sore throat Respiratory: ( - ) cough, ( - ) dyspnea, ( - ) wheezes Cardiovascular: ( - ) palpitation, ( - ) chest discomfort, ( - ) lower extremity swelling Gastrointestinal:  ( - ) nausea, ( - ) heartburn, ( - ) change in bowel habits Skin: ( - ) abnormal skin rashes Lymphatics: ( - ) new lymphadenopathy, ( - ) easy bruising Neurological: ( - ) numbness, ( - ) tingling, ( - ) new weaknesses Behavioral/Psych: ( - ) mood change, ( - ) new changes  All other systems were reviewed with the patient and are negative.  PHYSICAL EXAMINATION:  Vitals:   04/12/23 0951  BP: 135/67  Pulse: (!) 47  Resp: 16  Temp: (!) 97.5 F (36.4 C)  SpO2: 99%      Filed Weights   04/12/23 0951  Weight: 166 lb 1.6 oz (75.3 kg)  GENERAL: Well-appearing elderly Caucasian male, alert, no distress and comfortable SKIN: skin color, texture, turgor are normal, no rashes or significant lesions EYES: conjunctiva are pink and non-injected, sclera clear LUNGS: clear to auscultation and percussion with normal breathing effort HEART: regular rate & rhythm and no murmurs and no  lower extremity edema Musculoskeletal: no cyanosis of digits and no clubbing  PSYCH: alert & oriented x 3, fluent speech NEURO: no focal motor/sensory deficits  LABORATORY DATA:  I have reviewed the data as listed    Latest Ref Rng & Units 04/12/2023    9:20 AM 03/15/2023    8:49 AM 03/01/2023    8:38 AM  CBC  WBC 4.0 - 10.5 K/uL 2.5  2.6  3.9   Hemoglobin 13.0 - 17.0 g/dL 16.1  09.6  9.9   Hematocrit 39.0 - 52.0 % 32.8  33.9  31.6   Platelets 150 - 400 K/uL 25  35  38        Latest Ref Rng & Units 04/12/2023    9:20 AM 03/15/2023    8:49 AM 03/01/2023    8:38 AM  CMP  Glucose 70 - 99 mg/dL 045  409  811   BUN 8 - 23 mg/dL 21  22  30    Creatinine 0.61 - 1.24 mg/dL 9.14  7.82  9.56   Sodium 135 - 145 mmol/L 140  141  141   Potassium 3.5 - 5.1 mmol/L 3.8  3.9  4.4   Chloride 98 - 111 mmol/L 106  108  108   CO2 22 - 32 mmol/L 27  28  27    Calcium 8.9 - 10.3 mg/dL 9.3  9.4  8.8   Total Protein 6.5 - 8.1 g/dL 6.9  6.8  6.7   Total Bilirubin 0.3 - 1.2 mg/dL 0.8  0.8  0.6   Alkaline Phos 38 - 126 U/L 54  52  53   AST 15 - 41 U/L 17  17  15    ALT 0 - 44 U/L 5  5  <5     Lab Results  Component Value Date   MPROTEIN Not Observed 12/07/2021   Lab Results  Component Value Date   KPAFRELGTCHN 45.3 (H) 12/07/2021   LAMBDASER 28.9 (H) 12/07/2021   KAPLAMBRATIO 1.57 12/07/2021   RADIOGRAPHIC STUDIES: No results found.  ASSESSMENT & PLAN Cesario Weidinger [place]. Is a 82 y.o. male who returns for a follow up for MDS.   # Myelodysplastic Syndrome, Single Lineage Dysplasia -- At this time Mr. Bellew has a low-grade myelodysplasia that is anemia predominant. --Given that his hemoglobin is consistently less than 10 I would recommend we proceed with erythropoietin therapy. Discussed with the patient other options including hypomethylating therapies and Luspatercept, however given that anemia is his primary issue would recommend erythropoietin to see if this helps to elevate his other  blood counts as well. --No clear signs of nutritional deficiency during our prior evaluation. --MDS FISH study was normal.  --Started retacrit 20,000 units q 2 weeks on 12/28/2021.  PLAN: --Due for retacrit injection today --Labs from today reviewed. Hgb 10.5, white blood cell 2.5, MCV 80.6, and platelets of 25.  --Will continue to monitor as long as platelet count is >20K. Could consider Promacta vs Nplate injections if Plt levels drop  --Continue with q 2 week retacrit injections as scheduled --RTC in 12 weeks time to assure he is tolerating the shots   No orders of the defined types were placed in this  encounter.   All questions were answered. The patient knows to call the clinic with any problems, questions or concerns.  A total of more than 25 minutes were spent on this encounter with face-to-face time and non-face-to-face time, including preparing to see the patient, ordering tests and/or medications, counseling the patient and coordination of care as outlined above.   Ulysees Barns, MD Department of Hematology/Oncology [facility] Phone: [phone] Pager: [phone] Email: [email or web address]  04/12/2023 5:33 PM

## 2023-04-13 DIAGNOSIS — D0439 Carcinoma in situ of skin of other parts of face: Secondary | ICD-10-CM | POA: Diagnosis not present

## 2023-04-13 DIAGNOSIS — Z85828 Personal history of other malignant neoplasm of skin: Secondary | ICD-10-CM | POA: Diagnosis not present

## 2023-04-20 ENCOUNTER — Inpatient Hospital Stay: Payer: PPO

## 2023-04-23 DIAGNOSIS — M5459 Other low back pain: Secondary | ICD-10-CM | POA: Diagnosis not present

## 2023-04-23 DIAGNOSIS — M47896 Other spondylosis, lumbar region: Secondary | ICD-10-CM | POA: Diagnosis not present

## 2023-04-23 DIAGNOSIS — M5416 Radiculopathy, lumbar region: Secondary | ICD-10-CM | POA: Diagnosis not present

## 2023-04-26 ENCOUNTER — Inpatient Hospital Stay: Payer: PPO

## 2023-04-26 ENCOUNTER — Other Ambulatory Visit: Payer: Self-pay

## 2023-04-26 VITALS — BP 142/59 | HR 66 | Temp 97.8°F | Resp 18

## 2023-04-26 DIAGNOSIS — D469 Myelodysplastic syndrome, unspecified: Secondary | ICD-10-CM

## 2023-04-26 LAB — CBC WITH DIFFERENTIAL (CANCER CENTER ONLY)
Abs Immature Granulocytes: 0.01 10*3/uL (ref 0.00–0.07)
Basophils Absolute: 0 10*3/uL (ref 0.0–0.1)
Basophils Relative: 1 %
Eosinophils Absolute: 0 10*3/uL (ref 0.0–0.5)
Eosinophils Relative: 1 %
HCT: 32.3 % — ABNORMAL LOW (ref 39.0–52.0)
Hemoglobin: 10.4 g/dL — ABNORMAL LOW (ref 13.0–17.0)
Immature Granulocytes: 0 %
Lymphocytes Relative: 47 %
Lymphs Abs: 1.2 10*3/uL (ref 0.7–4.0)
MCH: 25.8 pg — ABNORMAL LOW (ref 26.0–34.0)
MCHC: 32.2 g/dL (ref 30.0–36.0)
MCV: 80.1 fL (ref 80.0–100.0)
Monocytes Absolute: 0.7 10*3/uL (ref 0.1–1.0)
Monocytes Relative: 28 %
Neutro Abs: 0.6 10*3/uL — ABNORMAL LOW (ref 1.7–7.7)
Neutrophils Relative %: 23 %
Platelet Count: 23 10*3/uL — ABNORMAL LOW (ref 150–400)
RBC: 4.03 MIL/uL — ABNORMAL LOW (ref 4.22–5.81)
RDW: 17 % — ABNORMAL HIGH (ref 11.5–15.5)
WBC Count: 2.5 10*3/uL — ABNORMAL LOW (ref 4.0–10.5)
nRBC: 0 % (ref 0.0–0.2)

## 2023-04-26 LAB — CMP (CANCER CENTER ONLY)
ALT: 5 U/L (ref 0–44)
AST: 17 U/L (ref 15–41)
Albumin: 4.2 g/dL (ref 3.5–5.0)
Alkaline Phosphatase: 53 U/L (ref 38–126)
Anion gap: 5 (ref 5–15)
BUN: 24 mg/dL — ABNORMAL HIGH (ref 8–23)
CO2: 27 mmol/L (ref 22–32)
Calcium: 9 mg/dL (ref 8.9–10.3)
Chloride: 107 mmol/L (ref 98–111)
Creatinine: 1.3 mg/dL — ABNORMAL HIGH (ref 0.61–1.24)
GFR, Estimated: 55 mL/min — ABNORMAL LOW (ref >60)
Glucose, Bld: 100 mg/dL — ABNORMAL HIGH (ref 70–99)
Potassium: 3.9 mmol/L (ref 3.5–5.1)
Sodium: 139 mmol/L (ref 135–145)
Total Bilirubin: 0.7 mg/dL (ref 0.3–1.2)
Total Protein: 6.7 g/dL (ref 6.5–8.1)

## 2023-04-26 MED ORDER — EPOETIN ALFA-EPBX 20000 UNIT/ML IJ SOLN
20000.0000 [IU] | Freq: Once | INTRAMUSCULAR | Status: AC
  Administered 2023-04-26: 20000 [IU] via SUBCUTANEOUS
  Filled 2023-04-26: qty 1

## 2023-04-30 DIAGNOSIS — I872 Venous insufficiency (chronic) (peripheral): Secondary | ICD-10-CM | POA: Diagnosis not present

## 2023-04-30 DIAGNOSIS — I8311 Varicose veins of right lower extremity with inflammation: Secondary | ICD-10-CM | POA: Diagnosis not present

## 2023-04-30 DIAGNOSIS — L821 Other seborrheic keratosis: Secondary | ICD-10-CM | POA: Diagnosis not present

## 2023-04-30 DIAGNOSIS — I8312 Varicose veins of left lower extremity with inflammation: Secondary | ICD-10-CM | POA: Diagnosis not present

## 2023-04-30 DIAGNOSIS — L570 Actinic keratosis: Secondary | ICD-10-CM | POA: Diagnosis not present

## 2023-04-30 DIAGNOSIS — D2371 Other benign neoplasm of skin of right lower limb, including hip: Secondary | ICD-10-CM | POA: Diagnosis not present

## 2023-04-30 DIAGNOSIS — D1801 Hemangioma of skin and subcutaneous tissue: Secondary | ICD-10-CM | POA: Diagnosis not present

## 2023-04-30 DIAGNOSIS — Z85828 Personal history of other malignant neoplasm of skin: Secondary | ICD-10-CM | POA: Diagnosis not present

## 2023-05-04 ENCOUNTER — Telehealth: Payer: Self-pay | Admitting: Internal Medicine

## 2023-05-04 ENCOUNTER — Inpatient Hospital Stay: Payer: PPO

## 2023-05-04 NOTE — Telephone Encounter (Signed)
Prescription Request  05/04/2023  LOV: 04/11/2023  What is the name of the medication or equipment?  gabapentin (NEURONTIN) 300 MG capsule [   Have you contacted your pharmacy to request a refill? No   Which pharmacy would you like this sent to?    [id number] - [address], [place] - [address] [place] [address] Phone: [phone] Fax: [phone]  Patient notified that their request is being sent to the clinical staff for review and that they should receive a response within 2 business days.   Please advise at Mobile [phone] (mobile)

## 2023-05-07 MED ORDER — GABAPENTIN 300 MG PO CAPS: 300.0000 mg | ORAL_CAPSULE | Freq: Four times a day (QID) | ORAL | 1 refills | Status: DC

## 2023-05-07 NOTE — Telephone Encounter (Signed)
Okay.  Thanks.

## 2023-05-10 ENCOUNTER — Inpatient Hospital Stay: Payer: PPO

## 2023-05-10 ENCOUNTER — Inpatient Hospital Stay: Payer: PPO | Attending: Physician Assistant

## 2023-05-10 VITALS — BP 140/71 | HR 78 | Temp 97.7°F | Resp 17

## 2023-05-10 DIAGNOSIS — D469 Myelodysplastic syndrome, unspecified: Secondary | ICD-10-CM

## 2023-05-10 LAB — CBC WITH DIFFERENTIAL (CANCER CENTER ONLY)
Abs Immature Granulocytes: 0.01 10*3/uL (ref 0.00–0.07)
Basophils Absolute: 0 10*3/uL (ref 0.0–0.1)
Basophils Relative: 1 %
Eosinophils Absolute: 0 10*3/uL (ref 0.0–0.5)
Eosinophils Relative: 1 %
HCT: 33.7 % — ABNORMAL LOW (ref 39.0–52.0)
Hemoglobin: 10.5 g/dL — ABNORMAL LOW (ref 13.0–17.0)
Immature Granulocytes: 0 %
Lymphocytes Relative: 36 %
Lymphs Abs: 1 10*3/uL (ref 0.7–4.0)
MCH: 24.9 pg — ABNORMAL LOW (ref 26.0–34.0)
MCHC: 31.2 g/dL (ref 30.0–36.0)
MCV: 80 fL (ref 80.0–100.0)
Monocytes Absolute: 0.7 10*3/uL (ref 0.1–1.0)
Monocytes Relative: 27 %
Neutro Abs: 1 10*3/uL — ABNORMAL LOW (ref 1.7–7.7)
Neutrophils Relative %: 35 %
Platelet Count: 24 10*3/uL — ABNORMAL LOW (ref 150–400)
RBC: 4.21 MIL/uL — ABNORMAL LOW (ref 4.22–5.81)
RDW: 16.2 % — ABNORMAL HIGH (ref 11.5–15.5)
WBC Count: 2.8 10*3/uL — ABNORMAL LOW (ref 4.0–10.5)
nRBC: 0 % (ref 0.0–0.2)

## 2023-05-10 LAB — CMP (CANCER CENTER ONLY)
ALT: 5 U/L (ref 0–44)
AST: 17 U/L (ref 15–41)
Albumin: 4.1 g/dL (ref 3.5–5.0)
Alkaline Phosphatase: 50 U/L (ref 38–126)
Anion gap: 6 (ref 5–15)
BUN: 22 mg/dL (ref 8–23)
CO2: 26 mmol/L (ref 22–32)
Calcium: 9 mg/dL (ref 8.9–10.3)
Chloride: 107 mmol/L (ref 98–111)
Creatinine: 1.24 mg/dL (ref 0.61–1.24)
GFR, Estimated: 58 mL/min — ABNORMAL LOW (ref >60)
Glucose, Bld: 135 mg/dL — ABNORMAL HIGH (ref 70–99)
Potassium: 3.8 mmol/L (ref 3.5–5.1)
Sodium: 139 mmol/L (ref 135–145)
Total Bilirubin: 0.9 mg/dL (ref <1.2)
Total Protein: 6.8 g/dL (ref 6.5–8.1)

## 2023-05-10 MED ORDER — EPOETIN ALFA-EPBX 20000 UNIT/ML IJ SOLN
20000.0000 [IU] | Freq: Once | INTRAMUSCULAR | Status: AC
  Administered 2023-05-10: 20000 [IU] via SUBCUTANEOUS
  Filled 2023-05-10: qty 1

## 2023-05-15 DIAGNOSIS — C44329 Squamous cell carcinoma of skin of other parts of face: Secondary | ICD-10-CM | POA: Diagnosis not present

## 2023-05-15 DIAGNOSIS — Z85828 Personal history of other malignant neoplasm of skin: Secondary | ICD-10-CM | POA: Diagnosis not present

## 2023-05-18 ENCOUNTER — Inpatient Hospital Stay: Payer: PPO

## 2023-05-22 ENCOUNTER — Ambulatory Visit: Payer: PPO | Admitting: Internal Medicine

## 2023-05-23 DIAGNOSIS — H353111 Nonexudative age-related macular degeneration, right eye, early dry stage: Secondary | ICD-10-CM | POA: Diagnosis not present

## 2023-05-23 DIAGNOSIS — H353221 Exudative age-related macular degeneration, left eye, with active choroidal neovascularization: Secondary | ICD-10-CM | POA: Diagnosis not present

## 2023-05-23 DIAGNOSIS — H43812 Vitreous degeneration, left eye: Secondary | ICD-10-CM | POA: Diagnosis not present

## 2023-05-23 DIAGNOSIS — H401130 Primary open-angle glaucoma, bilateral, stage unspecified: Secondary | ICD-10-CM | POA: Diagnosis not present

## 2023-05-24 ENCOUNTER — Inpatient Hospital Stay: Payer: PPO

## 2023-05-24 VITALS — BP 135/70 | HR 61 | Temp 98.2°F | Resp 16

## 2023-05-24 DIAGNOSIS — D469 Myelodysplastic syndrome, unspecified: Secondary | ICD-10-CM | POA: Diagnosis not present

## 2023-05-24 LAB — CBC WITH DIFFERENTIAL (CANCER CENTER ONLY)
Abs Immature Granulocytes: 0.01 10*3/uL (ref 0.00–0.07)
Basophils Absolute: 0 10*3/uL (ref 0.0–0.1)
Basophils Relative: 1 %
Eosinophils Absolute: 0 10*3/uL (ref 0.0–0.5)
Eosinophils Relative: 1 %
HCT: 34.2 % — ABNORMAL LOW (ref 39.0–52.0)
Hemoglobin: 10.6 g/dL — ABNORMAL LOW (ref 13.0–17.0)
Immature Granulocytes: 0 %
Lymphocytes Relative: 37 %
Lymphs Abs: 0.9 10*3/uL (ref 0.7–4.0)
MCH: 25 pg — ABNORMAL LOW (ref 26.0–34.0)
MCHC: 31 g/dL (ref 30.0–36.0)
MCV: 80.7 fL (ref 80.0–100.0)
Monocytes Absolute: 0.7 10*3/uL (ref 0.1–1.0)
Monocytes Relative: 29 %
Neutro Abs: 0.7 10*3/uL — ABNORMAL LOW (ref 1.7–7.7)
Neutrophils Relative %: 32 %
Platelet Count: 26 10*3/uL — ABNORMAL LOW (ref 150–400)
RBC: 4.24 MIL/uL (ref 4.22–5.81)
RDW: 15.9 % — ABNORMAL HIGH (ref 11.5–15.5)
WBC Count: 2.3 10*3/uL — ABNORMAL LOW (ref 4.0–10.5)
nRBC: 0 % (ref 0.0–0.2)

## 2023-05-24 LAB — CMP (CANCER CENTER ONLY)
ALT: <5 U/L (ref 0–44)
AST: 18 U/L (ref 15–41)
Albumin: 4.1 g/dL (ref 3.5–5.0)
Alkaline Phosphatase: 57 U/L (ref 38–126)
Anion gap: 6 (ref 5–15)
BUN: 19 mg/dL (ref 8–23)
CO2: 27 mmol/L (ref 22–32)
Calcium: 9.4 mg/dL (ref 8.9–10.3)
Chloride: 107 mmol/L (ref 98–111)
Creatinine: 1.16 mg/dL (ref 0.61–1.24)
GFR, Estimated: >60 mL/min (ref >60)
Glucose, Bld: 105 mg/dL — ABNORMAL HIGH (ref 70–99)
Potassium: 3.7 mmol/L (ref 3.5–5.1)
Sodium: 140 mmol/L (ref 135–145)
Total Bilirubin: 0.7 mg/dL (ref <1.2)
Total Protein: 6.8 g/dL (ref 6.5–8.1)

## 2023-05-24 MED ORDER — EPOETIN ALFA-EPBX 20000 UNIT/ML IJ SOLN
20000.0000 [IU] | Freq: Once | INTRAMUSCULAR | Status: AC
  Administered 2023-05-24: 20000 [IU] via SUBCUTANEOUS
  Filled 2023-05-24: qty 1

## 2023-05-25 ENCOUNTER — Telehealth: Payer: Self-pay | Admitting: Hematology and Oncology

## 2023-06-01 ENCOUNTER — Inpatient Hospital Stay: Payer: PPO | Admitting: Hematology and Oncology

## 2023-06-01 ENCOUNTER — Inpatient Hospital Stay: Payer: PPO

## 2023-06-07 ENCOUNTER — Inpatient Hospital Stay: Payer: PPO

## 2023-06-07 ENCOUNTER — Encounter: Payer: Self-pay | Admitting: Nurse Practitioner

## 2023-06-07 ENCOUNTER — Inpatient Hospital Stay: Payer: PPO | Attending: Physician Assistant

## 2023-06-07 ENCOUNTER — Inpatient Hospital Stay: Payer: PPO | Admitting: Nurse Practitioner

## 2023-06-07 ENCOUNTER — Inpatient Hospital Stay: Payer: PPO | Admitting: Hematology and Oncology

## 2023-06-07 VITALS — BP 149/79 | HR 63 | Temp 98.2°F | Resp 16 | Ht 68.0 in | Wt 164.0 lb

## 2023-06-07 DIAGNOSIS — D469 Myelodysplastic syndrome, unspecified: Secondary | ICD-10-CM

## 2023-06-07 DIAGNOSIS — D4620 Refractory anemia with excess of blasts, unspecified: Secondary | ICD-10-CM | POA: Diagnosis not present

## 2023-06-07 LAB — CMP (CANCER CENTER ONLY)
ALT: 5 U/L (ref 0–44)
AST: 17 U/L (ref 15–41)
Albumin: 4.2 g/dL (ref 3.5–5.0)
Alkaline Phosphatase: 50 U/L (ref 38–126)
Anion gap: 6 (ref 5–15)
BUN: 23 mg/dL (ref 8–23)
CO2: 25 mmol/L (ref 22–32)
Calcium: 9.1 mg/dL (ref 8.9–10.3)
Chloride: 109 mmol/L (ref 98–111)
Creatinine: 1.16 mg/dL (ref 0.61–1.24)
GFR, Estimated: >60 mL/min (ref >60)
Glucose, Bld: 102 mg/dL — ABNORMAL HIGH (ref 70–99)
Potassium: 4.1 mmol/L (ref 3.5–5.1)
Sodium: 140 mmol/L (ref 135–145)
Total Bilirubin: 0.8 mg/dL (ref <1.2)
Total Protein: 6.7 g/dL (ref 6.5–8.1)

## 2023-06-07 LAB — CBC WITH DIFFERENTIAL (CANCER CENTER ONLY)
Abs Immature Granulocytes: 0.01 10*3/uL (ref 0.00–0.07)
Basophils Absolute: 0 10*3/uL (ref 0.0–0.1)
Basophils Relative: 1 %
Eosinophils Absolute: 0 10*3/uL (ref 0.0–0.5)
Eosinophils Relative: 1 %
HCT: 33.8 % — ABNORMAL LOW (ref 39.0–52.0)
Hemoglobin: 10.7 g/dL — ABNORMAL LOW (ref 13.0–17.0)
Immature Granulocytes: 0 %
Lymphocytes Relative: 37 %
Lymphs Abs: 1.2 10*3/uL (ref 0.7–4.0)
MCH: 25.2 pg — ABNORMAL LOW (ref 26.0–34.0)
MCHC: 31.7 g/dL (ref 30.0–36.0)
MCV: 79.7 fL — ABNORMAL LOW (ref 80.0–100.0)
Monocytes Absolute: 0.9 10*3/uL (ref 0.1–1.0)
Monocytes Relative: 28 %
Neutro Abs: 1.1 10*3/uL — ABNORMAL LOW (ref 1.7–7.7)
Neutrophils Relative %: 33 %
Platelet Count: 22 10*3/uL — ABNORMAL LOW (ref 150–400)
RBC: 4.24 MIL/uL (ref 4.22–5.81)
RDW: 15.9 % — ABNORMAL HIGH (ref 11.5–15.5)
WBC Count: 3.2 10*3/uL — ABNORMAL LOW (ref 4.0–10.5)
nRBC: 0 % (ref 0.0–0.2)

## 2023-06-07 MED ORDER — EPOETIN ALFA-EPBX 20000 UNIT/ML IJ SOLN
20000.0000 [IU] | Freq: Once | INTRAMUSCULAR | Status: AC
  Administered 2023-06-07: 20000 [IU] via SUBCUTANEOUS

## 2023-06-07 NOTE — Progress Notes (Signed)
Patient Care Team: Plotnikov, Georgina Quint, MD as PCP - General Parke Poisson, MD as PCP - Cardiology (Cardiology) Jeani Hawking, MD as Consulting Physician (Gastroenterology) [place], Amy, MD as Consulting Physician (Dermatology) Glendale Chard, DO as Consulting Physician (Neurology) Sheran Luz, MD as Consulting Physician (Physical Medicine and Rehabilitation) Durene Romans, MD as Consulting Physician (Orthopedic Surgery) Szabat, Vinnie Level, [facility] (Inactive) as Pharmacist (Pharmacist) Jaci Standard, MD as Consulting Physician (Hematology and Oncology)   CHIEF COMPLAINT: Follow-up pancytopenia and MDS  CURRENT THERAPY: Retacrit every 2 weeks  INTERVAL HISTORY Justin [name] returns for follow-up as scheduled, last seen by Dr. Leonides Schanz 04/12/2023, continues Retacrit q2 weeks.  Doing well with no significant changes.  He does bleed at times; denies epistaxis, hematuria, hematochezia.  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  Denies fall, pain, recent infection, or any other new or specific concerns.  ROS  All other systems reviewed and negative  Past Medical History:  Diagnosis Date   Alcoholism (HCC)    Dry 13 years   Basal cell carcinoma    Right Chest   BPH (benign prostatic hyperplasia)    Diverticulosis of colon    Dizzy spells    none recent   DJD (degenerative joint disease)    lower back   Elbow fracture, left 2009   Dr Carola Frost   GERD (gastroesophageal reflux disease)    Glaucoma    History of TIAs 1 yrs ago   Hyperkeratosis    LBP (low back pain)    Macular degeneration    left wet right dry sees dr Luciana Axe   Melanoma [facility]) 2009   x3 Dr Amy [place]   Osteoarthritis    Restless leg syndrome    Thrombocytopenia (HCC) 11/18/2021   Tinnitus    Tobacco abuse    Uses walker    Wears glasses    reading   Wears partial dentures    upper     Past Surgical History:  Procedure Laterality Date    CATARACT EXTRACTION Bilateral 2018   HARDWARE REMOVAL Left 07/09/2020   Procedure: Left elbow hardware removal;  Surgeon: Yolonda Kida, MD;  Location: [facility];  Service: Orthopedics;  Laterality: Left;  60 mins   mda     Dr. Luciana Axe wet injection   MELANOMA EXCISION  2009   x3   RECONSTRUCTION MEDIAL COLLATERAL LIGAMENT ELBOW W/ TENDON GRAFT  2009   Left, Dr Carola Frost   TONSILLECTOMY  as child   TOTAL KNEE ARTHROPLASTY Right 11/09/2020   Procedure: TOTAL KNEE ARTHROPLASTY;  Surgeon: Durene Romans, MD;  Location: [facility];  Service: Orthopedics;  Laterality: Right;     Outpatient Encounter Medications as of 06/07/2023  Medication Sig   Acetaminophen (TYLENOL PO) Tylenol   amLODipine (NORVASC) 5 MG tablet TAKE 1 TABLET BY MOUTH DAILY   calcium carbonate (TUMS EX) 750 MG chewable tablet Chew 1 tablet by mouth daily.   carbidopa-levodopa (SINEMET IR) 25-100 MG tablet For breakthrough restless leg, you can take 1 tablet at bedtime.  OK to take extra tab as needed during the day. (Patient taking differently: Take 1 tablet by mouth at bedtime.)   Cholecalciferol 1000 UNITS tablet Take 3,000 Units by mouth daily.   Cyanocobalamin (VITAMIN B-12) 1000 MCG SUBL DISSOLVE 2 TABLETS IN MOUTH EVERY DAY   ferrous sulfate 325 (65 FE) MG tablet Take 650 mg by mouth daily with breakfast.   finasteride (  PROSCAR) 5 MG tablet Take 1 tablet (5 mg total) by mouth daily.   gabapentin (NEURONTIN) 300 MG capsule Take 1 capsule (300 mg total) by mouth 4 (four) times daily. Follow-up appt due in Sept must see provider for future refills   magnesium hydroxide (MILK OF MAGNESIA) 400 MG/5ML suspension Take 15 mLs by mouth daily as needed for mild constipation.   meclizine (ANTIVERT) 12.5 MG tablet TAKE 1 TABLET BY MOUTH 3 TIMES DAILY AS NEEDED FOR DIZZINESS.   Multiple Vitamins-Minerals (PRESERVISION AREDS PO) Take 2 tablets by mouth 2 (two) times daily.   Oxycodone HCl 10 MG TABS Take 10 mg by mouth 5  (five) times daily as needed (pain). Typically only uses 4 times daily   Polyethyl Glycol-Propyl Glycol (SYSTANE OP) Place 1 drop into both eyes 2 (two) times daily as needed (dry eyes). [facility] O[facility]   pravastatin (PRAVACHOL) 20 MG tablet Take 1 tablet (20 mg total) by mouth daily.   rOPINIRole (REQUIP) 2 MG tablet TAKE 1 TABLET BY MOUTH AT AT NOON AND 1 TAB AT 4PM AND 1 TAB AT BEDTIME   tamsulosin (FLOMAX) 0.4 MG CAPS capsule Take 1 capsule (0.4 mg total) by mouth in the morning.   terazosin (HYTRIN) 2 MG capsule TAKE 1 CAPSULE BY MOUTH EVERYDAY AT BEDTIME   No facility-administered encounter medications on file as of 06/07/2023.     Today's Vitals   06/07/23 0931 06/07/23 0936  BP: (!) 149/79   Pulse: 63   Resp: 16   Temp: 98.2 F (36.8 C)   TempSrc: Temporal   SpO2: 100%   Weight: 164 lb (74.4 kg)   Height: 5\' 8"  (1.727 m)   PainSc:  0-No pain   Body mass index is 24.94 kg/m.   PHYSICAL EXAM GENERAL:alert, no distress and comfortable SKIN: no rash.  Healing abrasion to nose and right cheek EYES: sclera clear LUNGS: clear with normal breathing effort HEART: regular rate & rhythm, no lower extremity edema   CBC    Component Value Date/Time   WBC 3.2 (L) 06/07/2023 0805   WBC 2.4 Repeated and verified X2. (L) 11/18/2021 1030   RBC 4.24 06/07/2023 0805   HGB 10.7 (L) 06/07/2023 0805   HCT 33.8 (L) 06/07/2023 0805   PLT 22 (L) 06/07/2023 0805   MCV 79.7 (L) 06/07/2023 0805   MCH 25.2 (L) 06/07/2023 0805   MCHC 31.7 06/07/2023 0805   RDW 15.9 (H) 06/07/2023 0805   LYMPHSABS 1.2 06/07/2023 0805   MONOABS 0.9 06/07/2023 0805   EOSABS 0.0 06/07/2023 0805   BASOSABS 0.0 06/07/2023 0805     CMP     Component Value Date/Time   NA 140 06/07/2023 0805   K 4.1 06/07/2023 0805   CL 109 06/07/2023 0805   CO2 25 06/07/2023 0805   GLUCOSE 102 (H) 06/07/2023 0805   BUN 23 06/07/2023 0805   CREATININE 1.16 06/07/2023 0805   CALCIUM 9.1 06/07/2023 0805   PROT 6.7 06/07/2023  0805   ALBUMIN 4.2 06/07/2023 0805   AST 17 06/07/2023 0805   ALT 5 06/07/2023 0805   ALKPHOS 50 06/07/2023 0805   BILITOT 0.8 06/07/2023 0805   GFRNONAA >60 06/07/2023 0805   GFRAA 96 04/15/2008 1150     ASSESSMENT & PLAN: 82 year old male  Pancytopenia and myelodysplastic Syndrome, Single Lineage Dysplasia -12/19/2021: Bone marrow biopsy which showed a low-grade myelodysplastic syndrome. FISH was normal -6/28/203: Started retacrit q 2 weeks -Mr. Barfoot appears stable. Tolerating Retacrit q2 weeks well. No  signs of thrombosis, bleeding, etc -Labs reviewed, ANC 1.1, platelet 22K, Hgb 10.7; he is responding to Retacrit, continue every 2 weeks -As previously discussed by Dr. Leonides Schanz if platelets drop < 20K we will consider starting Promacta, continue monitoring for now. -Reviewed bleeding and neutropenic precautions -Follow-up in 8 weeks, or sooner if needed     PLAN: -Labs reviewed, ANC 1.1, platelet 22K, Hgb 10.7 -Proceed with Retacrit today, same dose, continue q2 weeks -Lab/inj q2 weeks -Follow-up in 8 weeks, or sooner if needed   All questions were answered. The patient knows to call the clinic with any problems, questions or concerns. No barriers to learning were detected.  Santiago Glad, NP-C 06/07/2023

## 2023-06-07 NOTE — Patient Instructions (Signed)

## 2023-06-08 ENCOUNTER — Ambulatory Visit: Payer: PPO | Admitting: Podiatry

## 2023-06-08 ENCOUNTER — Encounter: Payer: Self-pay | Admitting: Podiatry

## 2023-06-08 DIAGNOSIS — B351 Tinea unguium: Secondary | ICD-10-CM

## 2023-06-08 DIAGNOSIS — M79674 Pain in right toe(s): Secondary | ICD-10-CM | POA: Diagnosis not present

## 2023-06-08 DIAGNOSIS — M79675 Pain in left toe(s): Secondary | ICD-10-CM

## 2023-06-10 NOTE — Progress Notes (Signed)
Subjective: Chief Complaint  Patient presents with   Nail Problem    Patient states he is here for a nail trim , everything is the same and his feet been great    82 y.o. returns the office today for painful, elongated, thickened toenails which he cannot trim himself and also a callus at the tip of the second toe.  No open lesions, swelling, redness or signs of infection he reports.   PCP: Plotnikov, Georgina Quint, MD  Objective: AAO 3, NAD DP/PT pulses palpable, CRT less than 3 seconds Neurological status is unchanged.  Nails hypertrophic, dystrophic, elongated, brittle, discolored 10. There is tenderness overlying the nails 1-5 bilaterally. There is no surrounding erythema or drainage along the nail sites. Hyperkeratotic lesion left distal second toe without any underlying ulceration drainage or signs of infection.  No new calluses are noted. Hammertoes are present. No pain with calf compression, swelling, warmth, erythema.  Assessment: Patient presents with symptomatic onychomycosis; skin fissure.   Plan: -Treatment options including alternatives, risks, complications were discussed -Nails sharply debrided 10 without complication/bleeding. -Hyperkeratotic lesion sharply debrided x 1 without any complications.  Continue offloading pads. -Neuropathy is stable, will continue to monitor  -Monitor for any clinical signs or symptoms of infection and directed to call the office immediately should any occur or go to the ER.  Vivi Barrack DPM

## 2023-06-21 ENCOUNTER — Inpatient Hospital Stay: Payer: PPO

## 2023-06-21 VITALS — BP 143/60 | HR 71 | Temp 97.9°F | Resp 16

## 2023-06-21 DIAGNOSIS — D4620 Refractory anemia with excess of blasts, unspecified: Secondary | ICD-10-CM | POA: Diagnosis not present

## 2023-06-21 DIAGNOSIS — D469 Myelodysplastic syndrome, unspecified: Secondary | ICD-10-CM

## 2023-06-21 LAB — CBC WITH DIFFERENTIAL (CANCER CENTER ONLY)
Abs Immature Granulocytes: 0.02 10*3/uL (ref 0.00–0.07)
Basophils Absolute: 0 10*3/uL (ref 0.0–0.1)
Basophils Relative: 0 %
Eosinophils Absolute: 0 10*3/uL (ref 0.0–0.5)
Eosinophils Relative: 1 %
HCT: 32.9 % — ABNORMAL LOW (ref 39.0–52.0)
Hemoglobin: 10.5 g/dL — ABNORMAL LOW (ref 13.0–17.0)
Immature Granulocytes: 1 %
Lymphocytes Relative: 30 %
Lymphs Abs: 0.9 10*3/uL (ref 0.7–4.0)
MCH: 25.3 pg — ABNORMAL LOW (ref 26.0–34.0)
MCHC: 31.9 g/dL (ref 30.0–36.0)
MCV: 79.3 fL — ABNORMAL LOW (ref 80.0–100.0)
Monocytes Absolute: 1.1 10*3/uL — ABNORMAL HIGH (ref 0.1–1.0)
Monocytes Relative: 36 %
Neutro Abs: 0.9 10*3/uL — ABNORMAL LOW (ref 1.7–7.7)
Neutrophils Relative %: 32 %
Platelet Count: 21 10*3/uL — ABNORMAL LOW (ref 150–400)
RBC: 4.15 MIL/uL — ABNORMAL LOW (ref 4.22–5.81)
RDW: 15.9 % — ABNORMAL HIGH (ref 11.5–15.5)
WBC Count: 3 10*3/uL — ABNORMAL LOW (ref 4.0–10.5)
nRBC: 0 % (ref 0.0–0.2)

## 2023-06-21 LAB — CMP (CANCER CENTER ONLY)
ALT: <5 U/L (ref 0–44)
AST: 17 U/L (ref 15–41)
Albumin: 4.2 g/dL (ref 3.5–5.0)
Alkaline Phosphatase: 56 U/L (ref 38–126)
Anion gap: 6 (ref 5–15)
BUN: 23 mg/dL (ref 8–23)
CO2: 26 mmol/L (ref 22–32)
Calcium: 9 mg/dL (ref 8.9–10.3)
Chloride: 109 mmol/L (ref 98–111)
Creatinine: 1.09 mg/dL (ref 0.61–1.24)
GFR, Estimated: >60 mL/min (ref >60)
Glucose, Bld: 123 mg/dL — ABNORMAL HIGH (ref 70–99)
Potassium: 3.8 mmol/L (ref 3.5–5.1)
Sodium: 141 mmol/L (ref 135–145)
Total Bilirubin: 0.7 mg/dL (ref <1.2)
Total Protein: 6.5 g/dL (ref 6.5–8.1)

## 2023-06-21 MED ORDER — EPOETIN ALFA-EPBX 20000 UNIT/ML IJ SOLN
20000.0000 [IU] | Freq: Once | INTRAMUSCULAR | Status: AC
  Administered 2023-06-21: 20000 [IU] via SUBCUTANEOUS
  Filled 2023-06-21: qty 1

## 2023-07-05 ENCOUNTER — Inpatient Hospital Stay: Payer: PPO | Attending: Physician Assistant

## 2023-07-05 ENCOUNTER — Inpatient Hospital Stay: Payer: PPO

## 2023-07-05 DIAGNOSIS — D4620 Refractory anemia with excess of blasts, unspecified: Secondary | ICD-10-CM | POA: Diagnosis not present

## 2023-07-05 DIAGNOSIS — D469 Myelodysplastic syndrome, unspecified: Secondary | ICD-10-CM

## 2023-07-05 DIAGNOSIS — Z79899 Other long term (current) drug therapy: Secondary | ICD-10-CM | POA: Diagnosis not present

## 2023-07-05 LAB — CMP (CANCER CENTER ONLY)
ALT: <5 U/L (ref 0–44)
AST: 16 U/L (ref 15–41)
Albumin: 4.1 g/dL (ref 3.5–5.0)
Alkaline Phosphatase: 54 U/L (ref 38–126)
Anion gap: 7 (ref 5–15)
BUN: 19 mg/dL (ref 8–23)
CO2: 27 mmol/L (ref 22–32)
Calcium: 9.3 mg/dL (ref 8.9–10.3)
Chloride: 108 mmol/L (ref 98–111)
Creatinine: 1.14 mg/dL (ref 0.61–1.24)
GFR, Estimated: >60 mL/min (ref >60)
Glucose, Bld: 113 mg/dL — ABNORMAL HIGH (ref 70–99)
Potassium: 3.9 mmol/L (ref 3.5–5.1)
Sodium: 142 mmol/L (ref 135–145)
Total Bilirubin: 0.8 mg/dL (ref 0.0–1.2)
Total Protein: 6.8 g/dL (ref 6.5–8.1)

## 2023-07-05 LAB — CBC WITH DIFFERENTIAL (CANCER CENTER ONLY)
Abs Immature Granulocytes: 0.01 10*3/uL (ref 0.00–0.07)
Basophils Absolute: 0 10*3/uL (ref 0.0–0.1)
Basophils Relative: 1 %
Eosinophils Absolute: 0 10*3/uL (ref 0.0–0.5)
Eosinophils Relative: 2 %
HCT: 35.7 % — ABNORMAL LOW (ref 39.0–52.0)
Hemoglobin: 11.1 g/dL — ABNORMAL LOW (ref 13.0–17.0)
Immature Granulocytes: 0 %
Lymphocytes Relative: 38 %
Lymphs Abs: 0.9 10*3/uL (ref 0.7–4.0)
MCH: 24.6 pg — ABNORMAL LOW (ref 26.0–34.0)
MCHC: 31.1 g/dL (ref 30.0–36.0)
MCV: 79 fL — ABNORMAL LOW (ref 80.0–100.0)
Monocytes Absolute: 0.8 10*3/uL (ref 0.1–1.0)
Monocytes Relative: 30 %
Neutro Abs: 0.7 10*3/uL — ABNORMAL LOW (ref 1.7–7.7)
Neutrophils Relative %: 29 %
Platelet Count: 27 10*3/uL — ABNORMAL LOW (ref 150–400)
RBC: 4.52 MIL/uL (ref 4.22–5.81)
RDW: 15.9 % — ABNORMAL HIGH (ref 11.5–15.5)
WBC Count: 2.5 10*3/uL — ABNORMAL LOW (ref 4.0–10.5)
nRBC: 0 % (ref 0.0–0.2)

## 2023-07-05 NOTE — Progress Notes (Signed)
 Hemoglobin was 11.1 today no injection given. Verified with Dr. Derek Mound nurse. Vitals stable were stable. Labs and AVS printed.

## 2023-07-17 DIAGNOSIS — H353111 Nonexudative age-related macular degeneration, right eye, early dry stage: Secondary | ICD-10-CM | POA: Diagnosis not present

## 2023-07-17 DIAGNOSIS — H43812 Vitreous degeneration, left eye: Secondary | ICD-10-CM | POA: Diagnosis not present

## 2023-07-17 DIAGNOSIS — H353221 Exudative age-related macular degeneration, left eye, with active choroidal neovascularization: Secondary | ICD-10-CM | POA: Diagnosis not present

## 2023-07-17 DIAGNOSIS — H401130 Primary open-angle glaucoma, bilateral, stage unspecified: Secondary | ICD-10-CM | POA: Diagnosis not present

## 2023-07-19 ENCOUNTER — Inpatient Hospital Stay: Payer: PPO

## 2023-07-19 VITALS — BP 135/69 | HR 69 | Temp 97.8°F | Resp 18

## 2023-07-19 DIAGNOSIS — D4620 Refractory anemia with excess of blasts, unspecified: Secondary | ICD-10-CM | POA: Diagnosis not present

## 2023-07-19 DIAGNOSIS — D469 Myelodysplastic syndrome, unspecified: Secondary | ICD-10-CM

## 2023-07-19 LAB — CBC WITH DIFFERENTIAL (CANCER CENTER ONLY)
Abs Immature Granulocytes: 0.02 10*3/uL (ref 0.00–0.07)
Basophils Absolute: 0 10*3/uL (ref 0.0–0.1)
Basophils Relative: 1 %
Eosinophils Absolute: 0.1 10*3/uL (ref 0.0–0.5)
Eosinophils Relative: 2 %
HCT: 31.3 % — ABNORMAL LOW (ref 39.0–52.0)
Hemoglobin: 9.8 g/dL — ABNORMAL LOW (ref 13.0–17.0)
Immature Granulocytes: 1 %
Lymphocytes Relative: 23 %
Lymphs Abs: 0.9 10*3/uL (ref 0.7–4.0)
MCH: 24.5 pg — ABNORMAL LOW (ref 26.0–34.0)
MCHC: 31.3 g/dL (ref 30.0–36.0)
MCV: 78.3 fL — ABNORMAL LOW (ref 80.0–100.0)
Monocytes Absolute: 1 10*3/uL (ref 0.1–1.0)
Monocytes Relative: 27 %
Neutro Abs: 1.8 10*3/uL (ref 1.7–7.7)
Neutrophils Relative %: 46 %
Platelet Count: 28 10*3/uL — ABNORMAL LOW (ref 150–400)
RBC: 4 MIL/uL — ABNORMAL LOW (ref 4.22–5.81)
RDW: 16.2 % — ABNORMAL HIGH (ref 11.5–15.5)
WBC Count: 3.8 10*3/uL — ABNORMAL LOW (ref 4.0–10.5)
nRBC: 0 % (ref 0.0–0.2)

## 2023-07-19 LAB — CMP (CANCER CENTER ONLY)
ALT: 5 U/L (ref 0–44)
AST: 15 U/L (ref 15–41)
Albumin: 4 g/dL (ref 3.5–5.0)
Alkaline Phosphatase: 54 U/L (ref 38–126)
Anion gap: 4 — ABNORMAL LOW (ref 5–15)
BUN: 21 mg/dL (ref 8–23)
CO2: 29 mmol/L (ref 22–32)
Calcium: 9.2 mg/dL (ref 8.9–10.3)
Chloride: 108 mmol/L (ref 98–111)
Creatinine: 1.16 mg/dL (ref 0.61–1.24)
GFR, Estimated: >60 mL/min (ref >60)
Glucose, Bld: 100 mg/dL — ABNORMAL HIGH (ref 70–99)
Potassium: 4.1 mmol/L (ref 3.5–5.1)
Sodium: 141 mmol/L (ref 135–145)
Total Bilirubin: 0.6 mg/dL (ref 0.0–1.2)
Total Protein: 6.6 g/dL (ref 6.5–8.1)

## 2023-07-19 MED ORDER — EPOETIN ALFA-EPBX 20000 UNIT/ML IJ SOLN
20000.0000 [IU] | Freq: Once | INTRAMUSCULAR | Status: AC
  Administered 2023-07-19: 20000 [IU] via SUBCUTANEOUS
  Filled 2023-07-19: qty 1

## 2023-07-20 ENCOUNTER — Ambulatory Visit (INDEPENDENT_AMBULATORY_CARE_PROVIDER_SITE_OTHER): Payer: PPO

## 2023-07-20 VITALS — Ht 68.0 in | Wt 164.0 lb

## 2023-07-20 DIAGNOSIS — Z0000 Encounter for general adult medical examination without abnormal findings: Secondary | ICD-10-CM

## 2023-07-20 NOTE — Progress Notes (Signed)
Subjective:   Justin [name]. is a 83 y.o. male who presents for Medicare Annual/Subsequent preventive examination.  Visit Complete: Virtual I connected with  Justin [name]. on 07/20/23 by a audio enabled telemedicine application and verified that I am speaking with the correct person using two identifiers.  Patient Location: Home  Provider Location: Office/Clinic  I discussed the limitations of evaluation and management by telemedicine. The patient expressed understanding and agreed to proceed.  Vital Signs: Because this visit was a virtual/telehealth visit, some criteria may be missing or patient reported. Any vitals not documented were not able to be obtained and vitals that have been documented are patient reported.   Cardiac Risk Factors include: advanced age (>45men, >51 women);male gender;Other (see comment);hypertension, Risk factor comments: CAD, BPH     Objective:    Today's Vitals   07/20/23 0918  Weight: 164 lb (74.4 kg)  Height: 5\' 8"  (1.727 m)  PainSc: 4    Body mass index is 24.94 kg/m.     07/20/2023   11:00 AM 08/10/2022   10:13 AM 08/07/2022    9:50 AM 12/07/2021    9:06 AM 07/25/2021   10:46 AM 11/09/2020    4:13 PM 10/28/2020   10:24 AM  Advanced Directives  Does Patient Have a Medical Advance Directive? No No No No Yes No No  Type of Agricultural consultant;Living will    Copy of Healthcare Power of Attorney in Chart?     No - copy requested    Would patient like information on creating a medical advance directive? No - Patient declined No - Patient declined No - Patient declined   No - Patient declined No - Patient declined    Current Medications (verified) Outpatient Encounter Medications as of 07/20/2023  Medication Sig   Acetaminophen (TYLENOL PO) Tylenol   amLODipine (NORVASC) 5 MG tablet TAKE 1 TABLET BY MOUTH DAILY   calcium carbonate (TUMS EX) 750 MG chewable tablet Chew 1 tablet by mouth daily.    carbidopa-levodopa (SINEMET IR) 25-100 MG tablet For breakthrough restless leg, you can take 1 tablet at bedtime.  OK to take extra tab as needed during the day. (Patient taking differently: Take 1 tablet by mouth at bedtime.)   Cholecalciferol 1000 UNITS tablet Take 3,000 Units by mouth daily.   Cyanocobalamin (VITAMIN B-12) 1000 MCG SUBL DISSOLVE 2 TABLETS IN MOUTH EVERY DAY   ferrous sulfate 325 (65 FE) MG tablet Take 650 mg by mouth daily with breakfast.   finasteride (PROSCAR) 5 MG tablet Take 1 tablet (5 mg total) by mouth daily.   gabapentin (NEURONTIN) 300 MG capsule Take 1 capsule (300 mg total) by mouth 4 (four) times daily. Follow-up appt due in Sept must see provider for future refills   magnesium hydroxide (MILK OF MAGNESIA) 400 MG/5ML suspension Take 15 mLs by mouth daily as needed for mild constipation.   meclizine (ANTIVERT) 12.5 MG tablet TAKE 1 TABLET BY MOUTH 3 TIMES DAILY AS NEEDED FOR DIZZINESS.   Multiple Vitamins-Minerals (PRESERVISION AREDS PO) Take 2 tablets by mouth 2 (two) times daily.   Oxycodone HCl 10 MG TABS Take 10 mg by mouth 5 (five) times daily as needed (pain). Typically only uses 4 times daily   Polyethyl Glycol-Propyl Glycol (SYSTANE OP) Place 1 drop into both eyes 2 (two) times daily as needed (dry eyes). [facility]   pravastatin (PRAVACHOL) 20 MG tablet Take 1 tablet (20 mg total)  by mouth daily.   rOPINIRole (REQUIP) 2 MG tablet TAKE 1 TABLET BY MOUTH AT AT NOON AND 1 TAB AT 4PM AND 1 TAB AT BEDTIME   tamsulosin (FLOMAX) 0.4 MG CAPS capsule Take 1 capsule (0.4 mg total) by mouth in the morning.   terazosin (HYTRIN) 2 MG capsule TAKE 1 CAPSULE BY MOUTH EVERYDAY AT BEDTIME   No facility-administered encounter medications on file as of 07/20/2023.    Allergies (verified) Levofloxacin   History: Past Medical History:  Diagnosis Date   Alcoholism (HCC)    Dry 13 years   Basal cell carcinoma    Right Chest   BPH (benign prostatic hyperplasia)     Diverticulosis of colon    Dizzy spells    none recent   DJD (degenerative joint disease)    lower back   Elbow fracture, left 2009   Dr Carola Frost   GERD (gastroesophageal reflux disease)    Glaucoma    History of TIAs 1 yrs ago   Hyperkeratosis    LBP (low back pain)    Macular degeneration    left wet right dry sees dr Luciana Axe   Melanoma [facility]) 2009   x3 Dr Amy Swaz[place]   Osteoarthritis    Restless leg syndrome    Thrombocytopenia (HCC) 11/18/2021   Tinnitus    Tobacco abuse    Uses walker    Wears glasses    reading   Wears partial dentures    upper   Past Surgical History:  Procedure Laterality Date   CATARACT EXTRACTION Bilateral 2018   HARDWARE REMOVAL Left 07/09/2020   Procedure: Left elbow hardware removal;  Surgeon: Yolonda Kida, MD;  Location: [facility];  Service: Orthopedics;  Laterality: Left;  60 mins   mda     Dr. Luciana Axe wet injection   MELANOMA EXCISION  2009   x3   RECONSTRUCTION MEDIAL COLLATERAL LIGAMENT ELBOW W/ TENDON GRAFT  2009   Left, Dr Carola Frost   TONSILLECTOMY  as child   TOTAL KNEE ARTHROPLASTY Right 11/09/2020   Procedure: TOTAL KNEE ARTHROPLASTY;  Surgeon: Durene Romans, MD;  Location: [facility];  Service: Orthopedics;  Laterality: Right;   Family History  Problem Relation Age of Onset   Cancer Mother        ?breast   Cancer Father        Lung   Social History   Socioeconomic History   Marital status: Married    Spouse name: Matilde Sprang   Number of children: 2   Years of education: Not on file   Highest education level: Not on file  Occupational History   Occupation: [place] Veteran - [employer]    Employer: RETIRED   Occupation: Scientist, research (physical sciences)   Occupation: Games developer  Tobacco Use   Smoking status: Former    Current packs/day: 0.00    Average packs/day: 3.0 packs/day for 38.0 years (114.0 ttl pk-yrs)    Types: Cigarettes    Start date: 77    Quit date: 1986    Years since quitting: 39.0    Smokeless tobacco: Never   Tobacco comments:    states he quit May 15 1985  Vaping Use   Vaping status: Never Used  Substance and Sexual Activity   Alcohol use: No    Comment: PREVIOUS - DRY 60yrs   Drug use: Not Currently   Sexual activity: Not Currently  Other Topics Concern   Not on file  Social History Narrative   Right  handed   One story home   Drinks coffee q am      Son and wife lives with him-2025   Social Drivers of Health   Financial Resource Strain: Low Risk  (07/20/2023)   Overall Financial Resource Strain (CARDIA)    Difficulty of Paying Living Expenses: Not very hard  Food Insecurity: No Food Insecurity (07/20/2023)   Hunger Vital Sign    Worried About Running Out of Food in the Last Year: Never true    Ran Out of Food in the Last Year: Never true  Transportation Needs: No Transportation Needs (07/20/2023)   PRAPARE - Administrator, Civil Service (Medical): No    Lack of Transportation (Non-Medical): No  Physical Activity: Inactive (07/20/2023)   Exercise Vital Sign    Days of Exercise per Week: 0 days    Minutes of Exercise per Session: 0 min  Stress: No Stress Concern Present (07/20/2023)   [employer] of Occupational Health - Occupational Stress Questionnaire    Feeling of Stress : Not at all  Social Connections: Moderately Isolated (07/20/2023)   Social Connection and Isolation Panel [NHANES]    Frequency of Communication with Friends and Family: More than three times a week    Frequency of Social Gatherings with Friends and Family: Three times a week    Attends Religious Services: Never    Active Member of Clubs or Organizations: No    Attends Banker Meetings: Never    Marital Status: Married    Tobacco Counseling Counseling given: Not Answered Tobacco comments: states he quit May 15 1985   Clinical Intake:  Pre-visit preparation completed: Yes  Pain Score: 4      BMI - recorded: 24.94 Nutritional Status: BMI  of 19-24  Normal Nutritional Risks: None  How often do you need to have someone help you when you read instructions, pamphlets, or other written materials from your doctor or pharmacy?: 1 - Never  Interpreter Needed?: No  Information entered by :: Oceane Fosse, RMA   Activities of Daily Living    07/20/2023    9:18 AM 08/07/2022    9:54 AM  In your present state of health, do you have any difficulty performing the following activities:  Hearing? 0 0  Vision? 0 0  Difficulty concentrating or making decisions? 0 0  Walking or climbing stairs? 0 0  Dressing or bathing? 0 0  Doing errands, shopping? 0 0  Preparing Food and eating ? N N  Using the Toilet? N N  In the past six months, have you accidently leaked urine? N N  Do you have problems with loss of bowel control? N N  Managing your Medications? N N  Managing your Finances? N N  Housekeeping or managing your Housekeeping? N N    Patient Care Team: Plotnikov, Georgina Quint, MD as PCP - General Parke Poisson, MD as PCP - Cardiology (Cardiology) Jeani Hawking, MD as Consulting Physician (Gastroenterology) [place], Amy, MD as Consulting Physician (Dermatology) Glendale Chard, DO as Consulting Physician (Neurology) Sheran Luz, MD as Consulting Physician (Physical Medicine and Rehabilitation) Durene Romans, MD as Consulting Physician (Orthopedic Surgery) Szabat, Vinnie Level, [facility] (Inactive) as Pharmacist (Pharmacist) Jaci Standard, MD as Consulting Physician (Hematology and Oncology)  Indicate any recent Medical Services you may have received from other than [facility] providers in the past year (date may be approximate).     Assessment:   This is a routine wellness examination for [place].  Hearing/Vision screen Hearing Screening - Comments:: Denies hearing difficulties   Vision Screening - Comments:: Intermediate stage nonexudative age-related macular degeneration of left eye   Goals Addressed             This  Visit's Progress    My goal for 2024 is to maintain my health by staying independent.   On track     Depression Screen    07/20/2023   11:04 AM 04/11/2023    2:47 PM 12/22/2022    4:14 PM 11/21/2022   11:14 AM 08/07/2022    9:54 AM 07/25/2021   10:46 AM 07/25/2021   10:44 AM  PHQ 2/9 Scores  PHQ - 2 Score 0 0 0 0 0 0 0  PHQ- 9 Score 4          Fall Risk    07/20/2023   11:00 AM 04/11/2023    2:47 PM 12/22/2022    4:13 PM 11/21/2022   11:14 AM 09/27/2022   11:21 AM  Fall Risk   Falls in the past year? 0 1 0 1 1  Number falls in past yr: 0 1 0 0 0  Injury with Fall? 0 1 0 1 1  Risk for fall due to : No Fall Risks History of fall(s) History of fall(s) History of fall(s) Impaired balance/gait;Impaired mobility;History of fall(s)  Follow up Falls prevention discussed;Falls evaluation completed Falls evaluation completed Falls evaluation completed Falls evaluation completed Falls evaluation completed    MEDICARE RISK AT HOME: Medicare Risk at Home Any stairs in or around the home?: No Home free of loose throw rugs in walkways, pet beds, electrical cords, etc?: Yes Adequate lighting in your home to reduce risk of falls?: Yes Life alert?: Yes Use of a cane, walker or w/c?: Yes Grab bars in the bathroom?: No Shower chair or bench in shower?: Yes Elevated toilet seat or a handicapped toilet?: No  TIMED UP AND GO:  Was the test performed?  No    Cognitive Function:    05/03/2017    8:33 AM  MMSE - Mini Mental State Exam  Orientation to time 5  Orientation to Place 5  Registration 3  Attention/ Calculation 5  Recall 2  Language- name 2 objects 2  Language- repeat 1  Language- follow 3 step command 3  Language- read & follow direction 1  Write a sentence 1  Copy design 1  Total score 29        07/20/2023    9:21 AM 08/07/2022    9:51 AM  6CIT Screen  What Year? 0 points 0 points  What month? 0 points 0 points  What time? 0 points 0 points  Count back from 20 0 points 0  points  Months in reverse 0 points 0 points  Repeat phrase 0 points 0 points  Total Score 0 points 0 points    Immunizations Immunization History  Administered Date(s) Administered   Fluad Quad(high Dose 65+) 03/19/2019, 03/15/2020, 04/19/2021, 04/25/2022   Influenza Split 03/30/2011, 05/09/2012   Influenza Whole 04/22/2008, 03/31/2009   Influenza, High Dose Seasonal PF 05/07/2013, 03/23/2016, 03/27/2017, 04/03/2018   Influenza,inj,Quad PF,6+ Mos 04/07/2014, 04/29/2015   Influenza-Unspecified 04/02/2006   PFIZER(Purple Top)SARS-COV-2 Vaccination 08/07/2019, 09/01/2019, 04/27/2020   PNEUMOCOCCAL CONJUGATE-20 05/23/2022   Pneumococcal Conjugate-13 06/13/2013   Pneumococcal Polysaccharide-23 10/27/2009   Pneumococcal-Unspecified 04/02/2004   Td 06/23/2010   Tdap 05/24/2022    TDAP status: Due, Education has been provided regarding the importance of this vaccine. Advised may receive  this vaccine at local pharmacy or Health Dept. Aware to provide a copy of the vaccination record if obtained from local pharmacy or Health Dept. Verbalized acceptance and understanding.  Flu Vaccine status: Up to date  Pneumococcal vaccine status: Up to date  Covid-19 vaccine status: Declined, Education has been provided regarding the importance of this vaccine but patient still declined. Advised may receive this vaccine at local pharmacy or Health Dept.or vaccine clinic. Aware to provide a copy of the vaccination record if obtained from local pharmacy or Health Dept. Verbalized acceptance and understanding.  Qualifies for Shingles Vaccine? No   Zostavax completed No   Shingrix Completed?: No.    Education has been provided regarding the importance of this vaccine. Patient has been advised to call insurance company to determine out of pocket expense if they have not yet received this vaccine. Advised may also receive vaccine at local pharmacy or Health Dept. Verbalized acceptance and  understanding.  Screening Tests Health Maintenance  Topic Date Due   COVID-19 Vaccine (4 - 2024-25 season) 03/04/2023   INFLUENZA VACCINE  03/20/2024 (Originally 02/01/2023)   Medicare Annual Wellness (AWV)  07/19/2024   DTaP/Tdap/Td (3 - Td or Tdap) 05/24/2032   Pneumonia Vaccine 22+ Years old  Completed   HPV VACCINES  Aged Out   Zoster Vaccines- Shingrix  Discontinued    Health Maintenance  Health Maintenance Due  Topic Date Due   COVID-19 Vaccine (4 - 2024-25 season) 03/04/2023    Colorectal cancer screening: No longer required.   Lung Cancer Screening: (Low Dose CT Chest recommended if Age 73-80 years, 20 pack-year currently smoking OR have quit w/in 15years.) does not qualify.   Lung Cancer Screening Referral: N/A  Additional Screening:  Hepatitis C Screening: does not qualify;   Vision Screening: Recommended annual ophthalmology exams for early detection of glaucoma and other disorders of the eye. Is the patient up to date with their annual eye exam?  Yes  Who is the provider or what is the name of the office in which the patient attends annual eye exams? [facility] ( Dr. Benjamine Mola) If pt is not established with a provider, would they like to be referred to a provider to establish care? No .   Dental Screening: Recommended annual dental exams for proper oral hygiene   Community Resource Referral / Chronic Care Management: CRR required this visit?  No   CCM required this visit?  No     Plan:     I have personally reviewed and noted the following in the patient's chart:   Medical and social history Use of alcohol, tobacco or illicit drugs  Current medications and supplements including opioid prescriptions. Patient is currently taking opioid prescriptions. Information provided to patient regarding non-opioid alternatives. Patient advised to discuss non-opioid treatment plan with their provider. Functional ability and status Nutritional status Physical  activity Advanced directives List of other physicians Hospitalizations, surgeries, and ER visits in previous 12 months Vitals Screenings to include cognitive, depression, and falls Referrals and appointments  In addition, I have reviewed and discussed with patient certain preventive protocols, quality metrics, and best practice recommendations. A written personalized care plan for preventive services as well as general preventive health recommendations were provided to patient.     December Hedtke L Lilyanna Lunt, CMA   07/20/2023   After Visit Summary: (MyChart) Due to this being a telephonic visit, the after visit summary with patients personalized plan was offered to patient via MyChart   Nurse Notes: Patient  is up to date on health maintenance.  He does decline the covid and Shingrix vaccines.  Patient had no concerns to address today.

## 2023-07-20 NOTE — Patient Instructions (Addendum)
Justin [name] , Thank you for taking time to come for your Medicare Wellness Visit. I appreciate your ongoing commitment to your health goals. Please review the following plan we discussed and let me know if I can assist you in the future.   Referrals/Orders/Follow-Ups/Clinician Recommendations: Each day, aim for 6 glasses of water, plenty of protein in your diet and try to get up and walk/ stretch every hour for 5-10 minutes at a time.  Keep up th good work.  This is a list of the screening recommended for you and due dates:  Health Maintenance  Topic Date Due   COVID-19 Vaccine (4 - 2024-25 season) 03/04/2023   Flu Shot  03/20/2024*   Medicare Annual Wellness Visit  07/19/2024   DTaP/Tdap/Td vaccine (3 - Td or Tdap) 05/24/2032   Pneumonia Vaccine  Completed   HPV Vaccine  Aged Out   Zoster (Shingles) Vaccine  Discontinued  *Topic was postponed. The date shown is not the original due date.    Advanced directives: (Declined) Advance directive discussed with you today. Even though you declined this today, please call our office should you change your mind, and we can give you the proper paperwork for you to fill out.  Next Medicare Annual Wellness Visit scheduled for next year: Yes  Managing Pain Without Opioids Opioids are strong medicines used to treat moderate to severe pain. For some people, especially those who have long-term (chronic) pain, opioids may not be the best choice for pain management due to: Side effects like nausea, constipation, and sleepiness. The risk of addiction (opioid use disorder). The longer you take opioids, the greater your risk of addiction. Pain that lasts for more than 3 months is called chronic pain. Managing chronic pain usually requires more than one approach and is often provided by a team of health care providers working together (multidisciplinary approach). Pain management may be done at a pain management center or pain clinic. How to manage pain  without the use of opioids Use non-opioid medicines Non-opioid medicines for pain may include: Over-the-counter or prescription non-steroidal anti-inflammatory drugs (NSAIDs). These may be the first medicines used for pain. They work well for muscle and bone pain, and they reduce swelling. Acetaminophen. This over-the-counter medicine may work well for milder pain but not swelling. Antidepressants. These may be used to treat chronic pain. A certain type of antidepressant (tricyclics) is often used. These medicines are given in lower doses for pain than when used for depression. Anticonvulsants. These are usually used to treat seizures but may also reduce nerve (neuropathic) pain. Muscle relaxants. These relieve pain caused by sudden muscle tightening (spasms). You may also use a pain medicine that is applied to the skin as a patch, cream, or gel (topical analgesic), such as a numbing medicine. These may cause fewer side effects than medicines taken by mouth. Do certain therapies as directed Some therapies can help with pain management. They include: Physical therapy. You will do exercises to gain strength and flexibility. A physical therapist may teach you exercises to move and stretch parts of your body that are weak, stiff, or painful. You can learn these exercises at physical therapy visits and practice them at home. Physical therapy may also involve: Massage. Heat wraps or applying heat or cold to affected areas. Electrical signals that interrupt pain signals (transcutaneous electrical nerve stimulation, TENS). Weak lasers that reduce pain and swelling (low-level laser therapy). Signals from your body that help you learn to regulate pain (biofeedback). Occupational therapy. This  helps you to learn ways to function at home and work with less pain. Recreational therapy. This involves trying new activities or hobbies, such as a physical activity or drawing. Mental health therapy,  including: Cognitive behavioral therapy (CBT). This helps you learn coping skills for dealing with pain. Acceptance and commitment therapy (ACT) to change the way you think and react to pain. Relaxation therapies, including muscle relaxation exercises and mindfulness-based stress reduction. Pain management counseling. This may be individual, family, or group counseling.  Receive medical treatments Medical treatments for pain management include: Nerve block injections. These may include a pain blocker and anti-inflammatory medicines. You may have injections: Near the spine to relieve chronic back or neck pain. Into joints to relieve back or joint pain. Into nerve areas that supply a painful area to relieve body pain. Into muscles (trigger point injections) to relieve some painful muscle conditions. A medical device placed near your spine to help block pain signals and relieve nerve pain or chronic back pain (spinal cord stimulation device). Acupuncture. Follow these instructions at home Medicines Take over-the-counter and prescription medicines only as told by your health care provider. If you are taking pain medicine, ask your health care providers about possible side effects to watch out for. Do not drive or use heavy machinery while taking prescription opioid pain medicine. Lifestyle  Do not use drugs or alcohol to reduce pain. If you drink alcohol, limit how much you have to: 0-1 drink a day for women who are not pregnant. 0-2 drinks a day for men. Know how much alcohol is in a drink. In the [place] one drink equals one 12 oz bottle of beer (355 mL), one 5 oz glass of wine (148 mL), or one 1 oz glass of hard liquor (44 mL). Do not use any products that contain nicotine or tobacco. These products include cigarettes, chewing tobacco, and vaping devices, such as e-cigarettes. If you need help quitting, ask your health care provider. Eat a healthy diet and maintain a healthy weight. Poor  diet and excess weight may make pain worse. Eat foods that are high in fiber. These include fresh fruits and vegetables, whole grains, and beans. Limit foods that are high in fat and processed sugars, such as fried and sweet foods. Exercise regularly. Exercise lowers stress and may help relieve pain. Ask your health care provider what activities and exercises are safe for you. If your health care provider approves, join an exercise class that combines movement and stress reduction. Examples include yoga and tai chi. Get enough sleep. Lack of sleep may make pain worse. Lower stress as much as possible. Practice stress reduction techniques as told by your therapist. General instructions Work with all your pain management providers to find the treatments that work best for you. You are an important member of your pain management team. There are many things you can do to reduce pain on your own. Consider joining an online or in-person support group for people who have chronic pain. Keep all follow-up visits. This is important. Where to find more information You can find more information about managing pain without opioids from: American Academy of Pain Medicine: painmed.org Institute for Chronic Pain: instituteforchronicpain.org American Chronic Pain Association: theacpa.org Contact a health care provider if: You have side effects from pain medicine. Your pain gets worse or does not get better with treatments or home therapy. You are struggling with anxiety or depression. Summary Many types of pain can be managed without opioids. Chronic pain may  respond better to pain management without opioids. Pain is best managed when you and a team of health care providers work together. Pain management without opioids may include non-opioid medicines, medical treatments, physical therapy, mental health therapy, and lifestyle changes. Tell your health care providers if your pain gets worse or is not being  managed well enough. This information is not intended to replace advice given to you by your health care provider. Make sure you discuss any questions you have with your health care provider. Document Revised: 09/29/2020 Document Reviewed: 09/29/2020 Elsevier Patient Education  2024 [employer].

## 2023-07-30 ENCOUNTER — Ambulatory Visit: Payer: Self-pay | Admitting: Internal Medicine

## 2023-07-30 ENCOUNTER — Encounter: Payer: Self-pay | Admitting: Hematology and Oncology

## 2023-07-30 ENCOUNTER — Ambulatory Visit (INDEPENDENT_AMBULATORY_CARE_PROVIDER_SITE_OTHER): Payer: PPO | Admitting: Internal Medicine

## 2023-07-30 ENCOUNTER — Encounter: Payer: Self-pay | Admitting: Internal Medicine

## 2023-07-30 VITALS — BP 146/80 | HR 92 | Temp 97.6°F | Wt 166.0 lb

## 2023-07-30 DIAGNOSIS — M7989 Other specified soft tissue disorders: Secondary | ICD-10-CM

## 2023-07-30 DIAGNOSIS — M79605 Pain in left leg: Secondary | ICD-10-CM

## 2023-07-30 MED ORDER — SULFAMETHOXAZOLE-TRIMETHOPRIM 800-160 MG PO TABS: 1.0000 | ORAL_TABLET | Freq: Two times a day (BID) | ORAL | 0 refills | Status: AC

## 2023-07-30 NOTE — Patient Instructions (Signed)
We have sent in bactrim for the skin infection 1 pill twice a day for 1 week. We will get the ultrasound on the legs to rule out a blood clot.

## 2023-07-30 NOTE — Telephone Encounter (Signed)
Copied from CRM [id number]. Topic: Clinical - Red Word Triage >> Jul 30, 2023  8:42 AM Marica Otter wrote: [facility] that prompted transfer to Nurse Triage: Left leg from ankle to knee is really swollen and painful, hurts to touch or put a sock on.    Chief Complaint: Left leg pain  Symptoms: left leg pain, swelling Disposition: [] ED /[] Urgent Care (no appt availability in office) / [x] Appointment(In office/virtual)/ []  [facility]/ [] Home Care/ [] Refused Recommended Disposition /[] [facility]/ []  Follow-up with PCP  Additional Notes: Patient stated he is having left leg pain and swelling for about 1 week. The pain is intermittent. When he does experience pain, it is level 3/10. He denies having pain currently. He believes the left leg feels more warm than the right. Same day appointment scheduled.     Reason for Disposition  Localized pain, redness or hard lump along vein  Answer Assessment - Initial Assessment Questions 1. ONSET: "When did the pain start?"      1 week ago 2. LOCATION: "Where is the pain located?"      Left leg 3. PAIN: "How bad is the pain?"    (Scale 1-10; or mild, moderate, severe)   -  MILD (1-3): doesn't interfere with normal activities    -  MODERATE (4-7): interferes with normal activities (e.g., work or school) or awakens from sleep, limping    -  SEVERE (8-10): excruciating pain, unable to do any normal activities, unable to walk     3/10 when pain occurs, no pain right now  4. CAUSE: "What do you think is causing the leg pain?"    Unknown. Patient denies injury. Concerned about possible blood clot  5. OTHER SYMPTOMS: "Do you have any other symptoms?" (e.g., chest pain, back pain, breathing difficulty, swelling, rash, fever, numbness, weakness)     Warm to touch, swelling  Protocols used: Leg Pain-A-AH

## 2023-07-30 NOTE — Progress Notes (Unsigned)
   Subjective:   Patient ID: Justin [name]., male    DOB: 11/10/1940, 83 y.o.   MRN: [id number]  HPI The patient is an 83 YO man coming in for left foot swelling and pain. The pain is in shin. They are concerned about blood clot. This is slightly warm to touch. Denies fevers or chills.   Review of Systems  Constitutional: Negative.   HENT: Negative.    Eyes: Negative.   Respiratory:  Negative for cough, chest tightness and shortness of breath.   Cardiovascular:  Positive for leg swelling. Negative for chest pain and palpitations.  Gastrointestinal:  Negative for abdominal distention, abdominal pain, constipation, diarrhea, nausea and vomiting.  Musculoskeletal:  Positive for myalgias.  Skin: Negative.   Neurological: Negative.   Psychiatric/Behavioral: Negative.      Objective:  Physical Exam Constitutional:      Appearance: He is well-developed.  HENT:     Head: Normocephalic and atraumatic.  Cardiovascular:     Rate and Rhythm: Normal rate and regular rhythm.  Pulmonary:     Effort: Pulmonary effort is normal. No respiratory distress.     Breath sounds: Normal breath sounds. No wheezing or rales.  Abdominal:     General: Bowel sounds are normal. There is no distension.     Palpations: Abdomen is soft.     Tenderness: There is no abdominal tenderness. There is no rebound.  Musculoskeletal:     Cervical back: Normal range of motion.     Left lower leg: Edema present.     Comments: Left and right leg swollen and they feel different although size looks more similar bilateral to me, tender to palpation left shin with mild redness.   Skin:    General: Skin is warm and dry.  Neurological:     Mental Status: He is alert and oriented to person, place, and time.     Coordination: Coordination normal.     Vitals:   07/30/23 1540 07/30/23 1545  BP: (!) 146/80 (!) 146/80  Pulse: 92   Temp: 97.6 F (36.4 C)   TempSrc: Oral   SpO2: 98%   Weight: 166 lb (75.3 kg)      Assessment & Plan:

## 2023-07-31 ENCOUNTER — Ambulatory Visit (HOSPITAL_COMMUNITY)
Admission: RE | Admit: 2023-07-31 | Discharge: 2023-07-31 | Disposition: A | Discharge status: Home / Self Care | Payer: PPO | Source: Ambulatory Visit | Attending: Internal Medicine | Admitting: Internal Medicine

## 2023-07-31 DIAGNOSIS — M7989 Other specified soft tissue disorders: Secondary | ICD-10-CM | POA: Diagnosis not present

## 2023-07-31 DIAGNOSIS — M79605 Pain in left leg: Secondary | ICD-10-CM | POA: Diagnosis not present

## 2023-08-01 NOTE — Assessment & Plan Note (Signed)
They deny trauma or impact to the area. Swelling is present bilaterally and seems more similar to me bilaterally. Ordered venous doppler to rule out clot and treating for early cellulitis with bactrim. Needs follow up 5 days to assess or sooner if worsening.

## 2023-08-02 ENCOUNTER — Inpatient Hospital Stay: Payer: PPO

## 2023-08-02 ENCOUNTER — Inpatient Hospital Stay (HOSPITAL_BASED_OUTPATIENT_CLINIC_OR_DEPARTMENT_OTHER): Payer: PPO | Admitting: Hematology and Oncology

## 2023-08-02 VITALS — BP 128/57 | HR 44 | Temp 97.9°F | Resp 17

## 2023-08-02 VITALS — BP 143/56 | HR 55 | Temp 98.0°F | Resp 17 | Wt 165.7 lb

## 2023-08-02 DIAGNOSIS — D61818 Other pancytopenia: Secondary | ICD-10-CM

## 2023-08-02 DIAGNOSIS — D469 Myelodysplastic syndrome, unspecified: Secondary | ICD-10-CM

## 2023-08-02 DIAGNOSIS — D4620 Refractory anemia with excess of blasts, unspecified: Secondary | ICD-10-CM | POA: Diagnosis not present

## 2023-08-02 LAB — CMP (CANCER CENTER ONLY)
ALT: 5 U/L (ref 0–44)
AST: 19 U/L (ref 15–41)
Albumin: 4 g/dL (ref 3.5–5.0)
Alkaline Phosphatase: 57 U/L (ref 38–126)
Anion gap: 7 (ref 5–15)
BUN: 22 mg/dL (ref 8–23)
CO2: 25 mmol/L (ref 22–32)
Calcium: 8.8 mg/dL — ABNORMAL LOW (ref 8.9–10.3)
Chloride: 107 mmol/L (ref 98–111)
Creatinine: 1.42 mg/dL — ABNORMAL HIGH (ref 0.61–1.24)
GFR, Estimated: 49 mL/min — ABNORMAL LOW (ref >60)
Glucose, Bld: 96 mg/dL (ref 70–99)
Potassium: 3.6 mmol/L (ref 3.5–5.1)
Sodium: 139 mmol/L (ref 135–145)
Total Bilirubin: 0.7 mg/dL (ref 0.0–1.2)
Total Protein: 6.7 g/dL (ref 6.5–8.1)

## 2023-08-02 LAB — CBC WITH DIFFERENTIAL (CANCER CENTER ONLY)
Abs Immature Granulocytes: 0.02 10*3/uL (ref 0.00–0.07)
Basophils Absolute: 0 10*3/uL (ref 0.0–0.1)
Basophils Relative: 1 %
Eosinophils Absolute: 0 10*3/uL (ref 0.0–0.5)
Eosinophils Relative: 1 %
HCT: 31.5 % — ABNORMAL LOW (ref 39.0–52.0)
Hemoglobin: 9.9 g/dL — ABNORMAL LOW (ref 13.0–17.0)
Immature Granulocytes: 1 %
Lymphocytes Relative: 24 %
Lymphs Abs: 0.8 10*3/uL (ref 0.7–4.0)
MCH: 24.6 pg — ABNORMAL LOW (ref 26.0–34.0)
MCHC: 31.4 g/dL (ref 30.0–36.0)
MCV: 78.4 fL — ABNORMAL LOW (ref 80.0–100.0)
Monocytes Absolute: 0.8 10*3/uL (ref 0.1–1.0)
Monocytes Relative: 24 %
Neutro Abs: 1.6 10*3/uL — ABNORMAL LOW (ref 1.7–7.7)
Neutrophils Relative %: 49 %
Platelet Count: 34 10*3/uL — ABNORMAL LOW (ref 150–400)
RBC: 4.02 MIL/uL — ABNORMAL LOW (ref 4.22–5.81)
RDW: 17.2 % — ABNORMAL HIGH (ref 11.5–15.5)
WBC Count: 3.2 10*3/uL — ABNORMAL LOW (ref 4.0–10.5)
nRBC: 0 % (ref 0.0–0.2)

## 2023-08-02 MED ORDER — EPOETIN ALFA-EPBX 20000 UNIT/ML IJ SOLN
20000.0000 [IU] | Freq: Once | INTRAMUSCULAR | Status: AC
  Administered 2023-08-02: 20000 [IU] via SUBCUTANEOUS
  Filled 2023-08-02: qty 1

## 2023-08-02 NOTE — Progress Notes (Signed)
[facility] [phone]   [phone]  PROGRESS NOTE  Patient Care Team: Plotnikov, Georgina Quint, MD as PCP - General Parke Poisson, MD as PCP - Cardiology (Cardiology) Jeani Hawking, MD as Consulting Physician (Gastroenterology) [place], Amy, MD as Consulting Physician (Dermatology) Glendale Chard, DO as Consulting Physician (Neurology) Sheran Luz, MD as Consulting Physician (Physical Medicine and Rehabilitation) Durene Romans, MD as Consulting Physician (Orthopedic Surgery) Szabat, Vinnie Level, [facility] (Inactive) as Pharmacist (Pharmacist) Jaci Standard, MD as Consulting Physician (Hematology and Oncology)  Hematological/Oncological History # Pancytopenia #Myelodysplastic Syndrome, Single Lineage Dysplasia 11/18/2021: WBC 2.4, ANC 1.1, Hgb 9.7, MCV 84.0, Plt 58. 12/07/2021: Establish care with Dr. Leonides Schanz and Georga Kaufmann 12/19/2021: Bone marrow biopsy which showed a low-grade myelodysplastic syndrome.  6/28/203: Started retacrit 20,000 units q 2 weeks  Interval History:  Justin [name]. 83 y.o. male with medical history significant for newly diagnosed low-grade myelodysplastic syndrome who presents for a follow up visit. The patient's last visit was on 04/12/2023. In the interim, he continues on retacrit injections q 2 weeks.   On exam today Justin [name] reports he has been holding his own but his energy levels do wear out quickly and he does tire easily.  He reports that he also does his best to try to take care of the day-to-day tasks around his house and take care of his wife.  He reports has not had any issues with bleeding, bruising, or dark stools.  Sometimes when he blows his nose in the morning there is a little bit of blood on the tissue.  He reports that he is eating well and his weight has been steady.  He is not having any lightheadedness, dizziness, shortness of breath.  He reports that he uses a cane to try with his balance.   Otherwise he has no questions concerns or complaints today.  He is otherwise at his baseline level of health and tolerating his EPO shots without any side effects or difficulty.. He denies fevers, chills, sweats, shortness of breath, chest pain or cough. He has no other complaints.  MEDICAL HISTORY:  Past Medical History:  Diagnosis Date   Alcoholism (HCC)    Dry 13 years   Basal cell carcinoma    Right Chest   BPH (benign prostatic hyperplasia)    Diverticulosis of colon    Dizzy spells    none recent   DJD (degenerative joint disease)    lower back   Elbow fracture, left 2009   Dr Carola Frost   GERD (gastroesophageal reflux disease)    Glaucoma    History of TIAs 1 yrs ago   Hyperkeratosis    LBP (low back pain)    Macular degeneration    left wet right dry sees dr Luciana Axe   Melanoma [facility]) 2009   x3 Dr Amy [place]   Osteoarthritis    Restless leg syndrome    Thrombocytopenia (HCC) 11/18/2021   Tinnitus    Tobacco abuse    Uses walker    Wears glasses    reading   Wears partial dentures    upper    SURGICAL HISTORY: Past Surgical History:  Procedure Laterality Date   CATARACT EXTRACTION Bilateral 2018   HARDWARE REMOVAL Left 07/09/2020   Procedure: Left elbow hardware removal;  Surgeon: Yolonda Kida, MD;  Location: [facility];  Service: Orthopedics;  Laterality: Left;  60 mins   mda     Dr. Luciana Axe wet injection  MELANOMA EXCISION  2009   x3   RECONSTRUCTION MEDIAL COLLATERAL LIGAMENT ELBOW W/ TENDON GRAFT  2009   Left, Dr Carola Frost   TONSILLECTOMY  as child   TOTAL KNEE ARTHROPLASTY Right 11/09/2020   Procedure: TOTAL KNEE ARTHROPLASTY;  Surgeon: Durene Romans, MD;  Location: [facility];  Service: Orthopedics;  Laterality: Right;    SOCIAL HISTORY: Social History   Socioeconomic History   Marital status: Married    Spouse name: Matilde Sprang   Number of children: 2   Years of education: Not on file   Highest education level: Not on file   Occupational History   Occupation: [place] Veteran - [employer]    Employer: RETIRED   Occupation: Scientist, research (physical sciences)   Occupation: Games developer  Tobacco Use   Smoking status: Former    Current packs/day: 0.00    Average packs/day: 3.0 packs/day for 38.0 years (114.0 ttl pk-yrs)    Types: Cigarettes    Start date: 4    Quit date: 1986    Years since quitting: 39.1   Smokeless tobacco: Never   Tobacco comments:    states he quit May 15 1985  Vaping Use   Vaping status: Never Used  Substance and Sexual Activity   Alcohol use: No    Comment: PREVIOUS - DRY 48yrs   Drug use: Not Currently   Sexual activity: Not Currently  Other Topics Concern   Not on file  Social History Narrative   Right handed   One story home   Drinks coffee q am      Son and wife lives with him-2025   Social Drivers of Health   Financial Resource Strain: Low Risk  (07/20/2023)   Overall Financial Resource Strain (CARDIA)    Difficulty of Paying Living Expenses: Not very hard  Food Insecurity: No Food Insecurity (07/20/2023)   Hunger Vital Sign    Worried About Running Out of Food in the Last Year: Never true    Ran Out of Food in the Last Year: Never true  Transportation Needs: No Transportation Needs (07/20/2023)   PRAPARE - Administrator, Civil Service (Medical): No    Lack of Transportation (Non-Medical): No  Physical Activity: Inactive (07/20/2023)   Exercise Vital Sign    Days of Exercise per Week: 0 days    Minutes of Exercise per Session: 0 min  Stress: No Stress Concern Present (07/20/2023)   [employer] of Occupational Health - Occupational Stress Questionnaire    Feeling of Stress : Not at all  Social Connections: Moderately Isolated (07/20/2023)   Social Connection and Isolation Panel [NHANES]    Frequency of Communication with Friends and Family: More than three times a week    Frequency of Social Gatherings with Friends and Family: Three times a week     Attends Religious Services: Never    Active Member of Clubs or Organizations: No    Attends Banker Meetings: Never    Marital Status: Married  Catering manager Violence: Not At Risk (07/20/2023)   Humiliation, Afraid, Rape, and Kick questionnaire    Fear of Current or Ex-Partner: No    Emotionally Abused: No    Physically Abused: No    Sexually Abused: No    FAMILY HISTORY: Family History  Problem Relation Age of Onset   Cancer Mother        ?breast   Cancer Father        Lung    ALLERGIES:  is  allergic to levofloxacin.  MEDICATIONS:  Current Outpatient Medications  Medication Sig Dispense Refill   Acetaminophen (TYLENOL PO) Tylenol     amLODipine (NORVASC) 5 MG tablet TAKE 1 TABLET BY MOUTH DAILY 90 tablet 3   calcium carbonate (TUMS EX) 750 MG chewable tablet Chew 1 tablet by mouth daily.     carbidopa-levodopa (SINEMET IR) 25-100 MG tablet For breakthrough restless leg, you can take 1 tablet at bedtime.  OK to take extra tab as needed during the day. (Patient taking differently: Take 1 tablet by mouth at bedtime.) 100 tablet 3   Cholecalciferol 1000 UNITS tablet Take 3,000 Units by mouth daily.     Cyanocobalamin (VITAMIN B-12) 1000 MCG SUBL DISSOLVE 2 TABLETS IN MOUTH EVERY DAY 180 tablet 1   ferrous sulfate 325 (65 FE) MG tablet Take 650 mg by mouth daily with breakfast.     finasteride (PROSCAR) 5 MG tablet Take 1 tablet (5 mg total) by mouth daily. 90 tablet 1   gabapentin (NEURONTIN) 300 MG capsule Take 1 capsule (300 mg total) by mouth 4 (four) times daily. Follow-up appt due in Sept must see provider for future refills 360 capsule 1   magnesium hydroxide (MILK OF MAGNESIA) 400 MG/5ML suspension Take 15 mLs by mouth daily as needed for mild constipation.     meclizine (ANTIVERT) 12.5 MG tablet TAKE 1 TABLET BY MOUTH 3 TIMES DAILY AS NEEDED FOR DIZZINESS. 90 tablet 2   Multiple Vitamins-Minerals (PRESERVISION AREDS PO) Take 2 tablets by mouth 2 (two) times  daily.     Oxycodone HCl 10 MG TABS Take 10 mg by mouth 5 (five) times daily as needed (pain). Typically only uses 4 times daily     Polyethyl Glycol-Propyl Glycol (SYSTANE OP) Place 1 drop into both eyes 2 (two) times daily as needed (dry eyes). [facility] O[facility]     pravastatin (PRAVACHOL) 20 MG tablet Take 1 tablet (20 mg total) by mouth daily. 90 tablet 1   rOPINIRole (REQUIP) 2 MG tablet TAKE 1 TABLET BY MOUTH AT AT NOON AND 1 TAB AT 4PM AND 1 TAB AT BEDTIME 270 tablet 1   sulfamethoxazole-trimethoprim (BACTRIM DS) 800-160 MG tablet Take 1 tablet by mouth 2 (two) times daily for 7 days. 14 tablet 0   tamsulosin (FLOMAX) 0.4 MG CAPS capsule Take 1 capsule (0.4 mg total) by mouth in the morning. 90 capsule 1   terazosin (HYTRIN) 2 MG capsule TAKE 1 CAPSULE BY MOUTH EVERYDAY AT BEDTIME 90 capsule 1   No current facility-administered medications for this visit.    REVIEW OF SYSTEMS:   Constitutional: ( - ) fevers, ( - )  chills , ( - ) night sweats Eyes: ( - ) blurriness of vision, ( - ) double vision, ( - ) watery eyes Ears, nose, mouth, throat, and face: ( - ) mucositis, ( - ) sore throat Respiratory: ( - ) cough, ( - ) dyspnea, ( - ) wheezes Cardiovascular: ( - ) palpitation, ( - ) chest discomfort, ( - ) lower extremity swelling Gastrointestinal:  ( - ) nausea, ( - ) heartburn, ( - ) change in bowel habits Skin: ( - ) abnormal skin rashes Lymphatics: ( - ) new lymphadenopathy, ( - ) easy bruising Neurological: ( - ) numbness, ( - ) tingling, ( - ) new weaknesses Behavioral/Psych: ( - ) mood change, ( - ) new changes  All other systems were reviewed with the patient and are negative.  PHYSICAL EXAMINATION:  Vitals:  08/02/23 1150  BP: (!) 143/56  Pulse: (!) 55  Resp: 17  Temp: 98 F (36.7 C)  SpO2: 98%       Filed Weights   08/02/23 1150  Weight: 165 lb 11.2 oz (75.2 kg)        GENERAL: Well-appearing elderly Caucasian male, alert, no distress and comfortable SKIN: skin  color, texture, turgor are normal, no rashes or significant lesions EYES: conjunctiva are pink and non-injected, sclera clear LUNGS: clear to auscultation and percussion with normal breathing effort HEART: regular rate & rhythm and no murmurs and no lower extremity edema Musculoskeletal: no cyanosis of digits and no clubbing  PSYCH: alert & oriented x 3, fluent speech NEURO: no focal motor/sensory deficits  LABORATORY DATA:  I have reviewed the data as listed    Latest Ref Rng & Units 08/02/2023   10:23 AM 07/19/2023    9:04 AM 07/05/2023    8:38 AM  CBC  WBC 4.0 - 10.5 K/uL 3.2  3.8  2.5   Hemoglobin 13.0 - 17.0 g/dL 9.9  9.8  78.2   Hematocrit 39.0 - 52.0 % 31.5  31.3  35.7   Platelets 150 - 400 K/uL 34  28  27        Latest Ref Rng & Units 08/02/2023   10:23 AM 07/19/2023    9:04 AM 07/05/2023    8:38 AM  CMP  Glucose 70 - 99 mg/dL 96  956  213   BUN 8 - 23 mg/dL 22  21  19    Creatinine 0.61 - 1.24 mg/dL 0.86  5.78  4.69   Sodium 135 - 145 mmol/L 139  141  142   Potassium 3.5 - 5.1 mmol/L 3.6  4.1  3.9   Chloride 98 - 111 mmol/L 107  108  108   CO2 22 - 32 mmol/L 25  29  27    Calcium 8.9 - 10.3 mg/dL 8.8  9.2  9.3   Total Protein 6.5 - 8.1 g/dL 6.7  6.6  6.8   Total Bilirubin 0.0 - 1.2 mg/dL 0.7  0.6  0.8   Alkaline Phos 38 - 126 U/L 57  54  54   AST 15 - 41 U/L 19  15  16    ALT 0 - 44 U/L 5  5  <5     Lab Results  Component Value Date   MPROTEIN Not Observed 12/07/2021   Lab Results  Component Value Date   KPAFRELGTCHN 45.3 (H) 12/07/2021   LAMBDASER 28.9 (H) 12/07/2021   KAPLAMBRATIO 1.57 12/07/2021   RADIOGRAPHIC STUDIES: VAS [place] LOWER EXTREMITY VENOUS (DVT) Result Date: 08/01/2023  Lower Venous DVT Study Patient [facility]:  [name].  Date of Exam:   07/31/2023 Medical Rec #: [id number]               Accession #:    [id number] Date of Birth: [birth year]               Patient Gender: M Patient Age:   51 years Exam Location:  [facility] Procedure:      VAS [place]  LOWER EXTREMITY VENOUS (DVT) Referring Phys: Lanora Manis CRAWFORD --------------------------------------------------------------------------------  Indications: Increase lower leg swelling, edema and tenderness x 1 week. Patient denies any SOB.  Risk Factors: None identified. Comparison Study: NA Performing Technologist: Tyna Jaksch RVT  Examination Guidelines: A complete evaluation includes B-mode imaging, spectral Doppler, color Doppler, and power Doppler as needed of all accessible portions of each vessel. Bilateral  testing is considered an integral part of a complete examination. Limited examinations for reoccurring indications may be performed as noted. The reflux portion of the exam is performed with the patient in reverse Trendelenburg.  +-----+---------------+---------+-----------+----------+--------------+ RIGHTCompressibilityPhasicitySpontaneityPropertiesThrombus Aging +-----+---------------+---------+-----------+----------+--------------+ CFV  Full           Yes      Yes                                 +-----+---------------+---------+-----------+----------+--------------+   +---------+---------------+---------+-----------+----------+--------------+ LEFT     CompressibilityPhasicitySpontaneityPropertiesThrombus Aging +---------+---------------+---------+-----------+----------+--------------+ CFV      Full           Yes      Yes                                 +---------+---------------+---------+-----------+----------+--------------+ SFJ      Full           Yes      Yes                                 +---------+---------------+---------+-----------+----------+--------------+ FV Prox  Full           Yes      Yes                                 +---------+---------------+---------+-----------+----------+--------------+ FV Mid   Full                                                         +---------+---------------+---------+-----------+----------+--------------+ FV DistalFull           Yes      Yes                                 +---------+---------------+---------+-----------+----------+--------------+ PFV      Full                    Yes                                 +---------+---------------+---------+-----------+----------+--------------+ POP      Full           Yes      Yes                                 +---------+---------------+---------+-----------+----------+--------------+ PTV      Full                                                        +---------+---------------+---------+-----------+----------+--------------+ PERO     Full                                                        +---------+---------------+---------+-----------+----------+--------------+  Gastroc  Full                                                        +---------+---------------+---------+-----------+----------+--------------+ GSV      Full           Yes      Yes                                 +---------+---------------+---------+-----------+----------+--------------+    Findings reported to Dr. Okey Dupre via Epic messaging at 3:36 pm.  Summary: RIGHT: - No evidence of common femoral vein obstruction.   LEFT: - No evidence of deep vein thrombosis in the lower extremity. No indirect evidence of obstruction proximal to the inguinal ligament. - No cystic structure found in the popliteal fossa.  *See table(s) above for measurements and observations. Electronically signed by Lemar Livings MD on 08/01/2023 at 8:12:50 AM.    Final     ASSESSMENT & PLAN Gershon Crane Elmarie [name]. Is a 83 y.o. male who returns for a follow up for MDS.   # Myelodysplastic Syndrome, Single Lineage Dysplasia -- At this time Mr. Scialdone has a low-grade myelodysplasia that is anemia predominant. --Given that his hemoglobin is consistently less than 10 I would recommend we proceed with  erythropoietin therapy. Discussed with the patient other options including hypomethylating therapies and Luspatercept, however given that anemia is his primary issue would recommend erythropoietin to see if this helps to elevate his other blood counts as well. --No clear signs of nutritional deficiency during our prior evaluation. --MDS FISH study was normal.  --Started retacrit 20,000 units q 2 weeks on 12/28/2021.  PLAN: --Due for retacrit injection today --Labs from today reviewed. Hgb 9.9, WBC 3.2, MCV 78.4, Plt 34  --Will continue to monitor as long as platelet count is >20K. Could consider Promacta vs Nplate injections if Plt levels drop  --Continue with q 2 week retacrit injections as scheduled --RTC in 12 weeks time to assure he is tolerating the shots   No orders of the defined types were placed in this encounter.   All questions were answered. The patient knows to call the clinic with any problems, questions or concerns.  A total of more than 25 minutes were spent on this encounter with face-to-face time and non-face-to-face time, including preparing to see the patient, ordering tests and/or medications, counseling the patient and coordination of care as outlined above.   Ulysees Barns, MD Department of Hematology/Oncology [facility] Phone: [phone] Pager: [phone] Email: [email or web address]  08/02/2023 3:54 PM

## 2023-08-06 ENCOUNTER — Encounter: Payer: Self-pay | Admitting: Internal Medicine

## 2023-08-06 ENCOUNTER — Ambulatory Visit (INDEPENDENT_AMBULATORY_CARE_PROVIDER_SITE_OTHER): Payer: PPO | Admitting: Internal Medicine

## 2023-08-06 VITALS — BP 110/60 | HR 56 | Temp 97.8°F | Ht 68.0 in | Wt 169.0 lb

## 2023-08-06 DIAGNOSIS — R601 Generalized edema: Secondary | ICD-10-CM

## 2023-08-06 DIAGNOSIS — I10 Essential (primary) hypertension: Secondary | ICD-10-CM | POA: Diagnosis not present

## 2023-08-06 DIAGNOSIS — N179 Acute kidney failure, unspecified: Secondary | ICD-10-CM | POA: Diagnosis not present

## 2023-08-06 DIAGNOSIS — D469 Myelodysplastic syndrome, unspecified: Secondary | ICD-10-CM

## 2023-08-06 NOTE — Assessment & Plan Note (Signed)
Labs w/Dr Leonides Schanz New decrease in GFR 49; LLE>RLE edema. Now his L hand is swollen, bruised (no DVT). On biweekly Retacrit  inj for MDS. Probably Retacrit side effect vs other. Amlodipine may be contributing in the edema.  F/u w/Dr Leonides Schanz RLE 1+, LLE 2+ edema L hand w/edema Hydrate well

## 2023-08-06 NOTE — Assessment & Plan Note (Signed)
Labs w/Dr Leonides Schanz New decrease in GFR 49; LLE>RLE edema. Now his L hand is swollen, bruised (no DVT). On biweekly Retacrit  inj for MDS. Probably Retacrit side effect vs other. F/u w/Dr Leonides Schanz RLE 1+, LLE 2+ edema L hand w/edema Hydrate well

## 2023-08-06 NOTE — Assessment & Plan Note (Signed)
New LLE>RLE edema. Now his L hand is swollen, bruised (no DVT). On biweekly Retacrit  inj for MDS. Probably Retacrit side effect vs other.F/u w/Dr Leonides Schanz RLE 1+, LLE 2+ edema L hand w/edema

## 2023-08-06 NOTE — Assessment & Plan Note (Signed)
New decrease in GFR 49; LLE>RLE edema. Now his L hand is swollen, bruised (no DVT). On biweekly Retacrit  inj for MDS. Probably Retacrit side effect vs other. F/u w/Dr Leonides Schanz RLE 1+, LLE 2+ edema L hand w/edema Hydrate well

## 2023-08-06 NOTE — Progress Notes (Signed)
Subjective:  Patient ID: Justin [name]., male    DOB: 1941-03-05  Age: 83 y.o. MRN: [id number]  CC: Medical Management of Chronic Issues (1 WEEK F/U, lt leg swelling due to cellulitis... Lt hand swelling and bruising.)   HPI [name]. presents for LLE swelling and ?cellulitis Co LLE>RLE edema. Now his L hand is swollen, bruised (no DVT). On biweekly Retacrit  inj for MDS   Outpatient Medications Prior to Visit  Medication Sig Dispense Refill   Acetaminophen (TYLENOL PO) Tylenol     amLODipine (NORVASC) 5 MG tablet TAKE 1 TABLET BY MOUTH DAILY 90 tablet 3   calcium carbonate (TUMS EX) 750 MG chewable tablet Chew 1 tablet by mouth daily.     carbidopa-levodopa (SINEMET IR) 25-100 MG tablet For breakthrough restless leg, you can take 1 tablet at bedtime.  OK to take extra tab as needed during the day. (Patient taking differently: Take 1 tablet by mouth at bedtime.) 100 tablet 3   Cholecalciferol 1000 UNITS tablet Take 3,000 Units by mouth daily.     Cyanocobalamin (VITAMIN B-12) 1000 MCG SUBL DISSOLVE 2 TABLETS IN MOUTH EVERY DAY 180 tablet 1   ferrous sulfate 325 (65 FE) MG tablet Take 650 mg by mouth daily with breakfast.     finasteride (PROSCAR) 5 MG tablet Take 1 tablet (5 mg total) by mouth daily. 90 tablet 1   gabapentin (NEURONTIN) 300 MG capsule Take 1 capsule (300 mg total) by mouth 4 (four) times daily. Follow-up appt due in Sept must see provider for future refills 360 capsule 1   magnesium hydroxide (MILK OF MAGNESIA) 400 MG/5ML suspension Take 15 mLs by mouth daily as needed for mild constipation.     meclizine (ANTIVERT) 12.5 MG tablet TAKE 1 TABLET BY MOUTH 3 TIMES DAILY AS NEEDED FOR DIZZINESS. 90 tablet 2   Multiple Vitamins-Minerals (PRESERVISION AREDS PO) Take 2 tablets by mouth 2 (two) times daily.     Oxycodone HCl 10 MG TABS Take 10 mg by mouth 5 (five) times daily as needed (pain). Typically only uses 4 times daily     Polyethyl Glycol-Propyl  Glycol (SYSTANE OP) Place 1 drop into both eyes 2 (two) times daily as needed (dry eyes). [facility] O[facility]     pravastatin (PRAVACHOL) 20 MG tablet Take 1 tablet (20 mg total) by mouth daily. 90 tablet 1   rOPINIRole (REQUIP) 2 MG tablet TAKE 1 TABLET BY MOUTH AT AT NOON AND 1 TAB AT 4PM AND 1 TAB AT BEDTIME 270 tablet 1   sulfamethoxazole-trimethoprim (BACTRIM DS) 800-160 MG tablet Take 1 tablet by mouth 2 (two) times daily for 7 days. 14 tablet 0   tamsulosin (FLOMAX) 0.4 MG CAPS capsule Take 1 capsule (0.4 mg total) by mouth in the morning. 90 capsule 1   terazosin (HYTRIN) 2 MG capsule TAKE 1 CAPSULE BY MOUTH EVERYDAY AT BEDTIME 90 capsule 1   No facility-administered medications prior to visit.    ROS: Review of Systems  Constitutional:  Positive for fatigue. Negative for appetite change and unexpected weight change.  HENT:  Negative for congestion, nosebleeds, sneezing, sore throat and trouble swallowing.   Eyes:  Negative for itching and visual disturbance.  Respiratory:  Negative for cough.   Cardiovascular:  Positive for leg swelling. Negative for chest pain and palpitations.  Gastrointestinal:  Negative for abdominal distention, blood in stool, diarrhea and nausea.  Genitourinary:  Negative for frequency and hematuria.  Musculoskeletal:  Positive for arthralgias, back  pain and gait problem. Negative for joint swelling and neck pain.  Skin:  Negative for rash.  Neurological:  Negative for dizziness, tremors, speech difficulty and weakness.  Hematological:  Bruises/bleeds easily.  Psychiatric/Behavioral:  Negative for agitation, dysphoric mood and sleep disturbance. The patient is not nervous/anxious.     Objective:  BP 110/60 (BP Location: Left Arm, Patient Position: Sitting, Cuff Size: Normal)   Pulse (!) 56   Temp 97.8 F (36.6 C) (Oral)   Ht 5\' 8"  (1.727 m)   Wt 169 lb (76.7 kg)   SpO2 92%   BMI 25.70 kg/m   BP Readings from Last 3 Encounters:  08/06/23 110/60  08/02/23 (!)  143/56  08/02/23 (!) 128/57    Wt Readings from Last 3 Encounters:  08/06/23 169 lb (76.7 kg)  08/02/23 165 lb 11.2 oz (75.2 kg)  07/30/23 166 lb (75.3 kg)    Physical Exam Constitutional:      General: He is not in acute distress.    Appearance: Normal appearance. He is well-developed.     Comments: NAD  Eyes:     Conjunctiva/sclera: Conjunctivae normal.     Pupils: Pupils are equal, round, and reactive to light.  Neck:     Thyroid: No thyromegaly.     Vascular: No JVD.  Cardiovascular:     Rate and Rhythm: Normal rate and regular rhythm.     Heart sounds: Normal heart sounds. No murmur heard.    No friction rub. No gallop.  Pulmonary:     Effort: Pulmonary effort is normal. No respiratory distress.     Breath sounds: Normal breath sounds. No wheezing or rales.  Chest:     Chest wall: No tenderness.  Abdominal:     General: Bowel sounds are normal. There is no distension.     Palpations: Abdomen is soft. There is no mass.     Tenderness: There is no abdominal tenderness. There is no guarding or rebound.  Musculoskeletal:        General: Tenderness present. Normal range of motion.     Cervical back: Normal range of motion.     Right lower leg: Edema present.     Left lower leg: Edema present.  Lymphadenopathy:     Cervical: No cervical adenopathy.  Skin:    General: Skin is warm and dry.     Findings: No rash.  Neurological:     Mental Status: He is alert and oriented to person, place, and time.     Cranial Nerves: No cranial nerve deficit.     Motor: Weakness present. No abnormal muscle tone.     Coordination: Coordination normal.     Gait: Gait abnormal.     Deep Tendon Reflexes: Reflexes are normal and symmetric.  Psychiatric:        Behavior: Behavior normal.        Thought Content: Thought content normal.        Judgment: Judgment normal.   RLE 1+, LLE 2+ edema L hand w/edema  Lab Results  Component Value Date   WBC 3.2 (L) 08/02/2023   HGB 9.9 (L)  08/02/2023   HCT 31.5 (L) 08/02/2023   PLT 34 (L) 08/02/2023   GLUCOSE 96 08/02/2023   CHOL 136 01/12/2017   TRIG 67.0 01/12/2017   HDL 54.70 01/12/2017   LDLCALC 68 01/12/2017   ALT 5 08/02/2023   AST 19 08/02/2023   NA 139 08/02/2023   K 3.6 08/02/2023   CL 107 08/02/2023  CREATININE 1.42 (H) 08/02/2023   BUN 22 08/02/2023   CO2 25 08/02/2023   TSH 3.05 10/16/2019   PSA 0.47 08/30/2016   INR 1.1 10/28/2020   HGBA1C 5.3 04/19/2021    VAS [place] LOWER EXTREMITY VENOUS (DVT) Result Date: 08/01/2023  Lower Venous DVT Study Patient Name:  Roston Grunewald [facility].  Date of Exam:   07/31/2023 Medical Rec #: [id number]               Accession #:    [id number] Date of Birth: 01/18/1941               Patient Gender: M Patient Age:   83 years Exam Location:  [facility] Procedure:      VAS [place] LOWER EXTREMITY VENOUS (DVT) Referring Phys: Lanora Manis CRAWFORD --------------------------------------------------------------------------------  Indications: Increase lower leg swelling, edema and tenderness x 1 week. Patient denies any SOB.  Risk Factors: None identified. Comparison Study: NA Performing Technologist: Tyna Jaksch RVT  Examination Guidelines: A complete evaluation includes B-mode imaging, spectral Doppler, color Doppler, and power Doppler as needed of all accessible portions of each vessel. Bilateral testing is considered an integral part of a complete examination. Limited examinations for reoccurring indications may be performed as noted. The reflux portion of the exam is performed with the patient in reverse Trendelenburg.  +-----+---------------+---------+-----------+----------+--------------+ RIGHTCompressibilityPhasicitySpontaneityPropertiesThrombus Aging +-----+---------------+---------+-----------+----------+--------------+ CFV  Full           Yes      Yes                                 +-----+---------------+---------+-----------+----------+--------------+    +---------+---------------+---------+-----------+----------+--------------+ LEFT     CompressibilityPhasicitySpontaneityPropertiesThrombus Aging +---------+---------------+---------+-----------+----------+--------------+ CFV      Full           Yes      Yes                                 +---------+---------------+---------+-----------+----------+--------------+ SFJ      Full           Yes      Yes                                 +---------+---------------+---------+-----------+----------+--------------+ FV Prox  Full           Yes      Yes                                 +---------+---------------+---------+-----------+----------+--------------+ FV Mid   Full                                                        +---------+---------------+---------+-----------+----------+--------------+ FV DistalFull           Yes      Yes                                 +---------+---------------+---------+-----------+----------+--------------+ PFV      Full  Yes                                 +---------+---------------+---------+-----------+----------+--------------+ POP      Full           Yes      Yes                                 +---------+---------------+---------+-----------+----------+--------------+ PTV      Full                                                        +---------+---------------+---------+-----------+----------+--------------+ PERO     Full                                                        +---------+---------------+---------+-----------+----------+--------------+ Gastroc  Full                                                        +---------+---------------+---------+-----------+----------+--------------+ GSV      Full           Yes      Yes                                 +---------+---------------+---------+-----------+----------+--------------+    Findings reported to Dr. Okey Dupre via Epic  messaging at 3:36 pm.  Summary: RIGHT: - No evidence of common femoral vein obstruction.   LEFT: - No evidence of deep vein thrombosis in the lower extremity. No indirect evidence of obstruction proximal to the inguinal ligament. - No cystic structure found in the popliteal fossa.  *See table(s) above for measurements and observations. Electronically signed by Lemar Livings MD on 08/01/2023 at 8:12:50 AM.    Final     Assessment & Plan:   Problem List Items Addressed This Visit     Edema - Primary   New LLE>RLE edema. Now his L hand is swollen, bruised (no DVT). On biweekly Retacrit  inj for MDS. Probably Retacrit side effect vs other.F/u w/Dr Leonides Schanz RLE 1+, LLE 2+ edema L hand w/edema      HTN (hypertension)   Labs w/Dr Leonides Schanz New decrease in GFR 49; LLE>RLE edema. Now his L hand is swollen, bruised (no DVT). On biweekly Retacrit  inj for MDS. Probably Retacrit side effect vs other. Amlodipine may be contributing in the edema.  F/u w/Dr Leonides Schanz RLE 1+, LLE 2+ edema L hand w/edema Hydrate well      MDS (myelodysplastic syndrome) (HCC)   Labs w/Dr Leonides Schanz New decrease in GFR 49; LLE>RLE edema. Now his L hand is swollen, bruised (no DVT). On biweekly Retacrit  inj for MDS. Probably Retacrit side effect vs other. F/u w/Dr Leonides Schanz RLE 1+, LLE 2+ edema L hand w/edema Hydrate well      ARF (acute renal failure) (  HCC)   New decrease in GFR 49; LLE>RLE edema. Now his L hand is swollen, bruised (no DVT). On biweekly Retacrit  inj for MDS. Probably Retacrit side effect vs other. F/u w/Dr Leonides Schanz RLE 1+, LLE 2+ edema L hand w/edema Hydrate well         No orders of the defined types were placed in this encounter.     Follow-up: No follow-ups on file.  Sonda Primes, MD

## 2023-08-08 ENCOUNTER — Telehealth: Payer: Self-pay | Admitting: *Deleted

## 2023-08-08 ENCOUNTER — Telehealth: Payer: Self-pay | Admitting: Hematology and Oncology

## 2023-08-08 NOTE — Telephone Encounter (Signed)
 TCT patient as he had called earlier today about swelling in his left leg.  Spoke with pt. He states he saw his PCP about this swelling in his left leg. He had an US  which was negative for DVT. He hwas treated for cellulitis with 5 days of Bactrim . He states his left ankle area is still very tender to the touch, reddish in color and swollen. Asked him if he would be ok to be seen in our [facility]. He is agreeable to this. Discussed with Dr. Federico and [place], [place] in [facility]. They agreed it would be ok to have him seen there.

## 2023-08-09 ENCOUNTER — Inpatient Hospital Stay: Payer: PPO | Attending: Physician Assistant | Admitting: Physician Assistant

## 2023-08-09 VITALS — BP 121/54 | HR 66 | Temp 97.9°F | Resp 18 | Ht 68.0 in | Wt 166.2 lb

## 2023-08-09 DIAGNOSIS — D4620 Refractory anemia with excess of blasts, unspecified: Secondary | ICD-10-CM | POA: Diagnosis not present

## 2023-08-09 DIAGNOSIS — Z79899 Other long term (current) drug therapy: Secondary | ICD-10-CM | POA: Diagnosis not present

## 2023-08-09 DIAGNOSIS — D469 Myelodysplastic syndrome, unspecified: Secondary | ICD-10-CM | POA: Diagnosis not present

## 2023-08-09 DIAGNOSIS — R600 Localized edema: Secondary | ICD-10-CM | POA: Diagnosis not present

## 2023-08-09 MED ORDER — FUROSEMIDE 20 MG PO TABS: 20.0000 mg | ORAL_TABLET | Freq: Every day | ORAL | 0 refills | Status: DC

## 2023-08-09 NOTE — Progress Notes (Signed)
 Symptom Management Consult Note [facility]    Patient Care Team: Plotnikov, Karlynn GAILS, MD as PCP - General Loni Soyla LABOR, MD as PCP - Cardiology (Cardiology) Rollin Dover, MD as Consulting Physician (Gastroenterology) Jordan, Amy, MD as Consulting Physician (Dermatology) Tobie Tonita POUR, DO as Consulting Physician (Neurology) Bonner Ade, MD as Consulting Physician (Physical Medicine and Rehabilitation) Ernie Cough, MD as Consulting Physician (Orthopedic Surgery) Szabat, Toribio BROCKS, [facility] (Inactive) as Pharmacist (Pharmacist) Federico Norleen ONEIDA MADISON, MD as Consulting Physician (Hematology and Oncology)    Name / MRN / DOB: Justin [name]  [id number]  [birth year]   Date of visit: 08/09/2023   Chief Complaint/Reason for visit: leg swelling   Current Therapy: Retacrit  every 2 weeks   ASSESSMENT & PLAN: Patient is a 83 y.o. male with oncologic history of low-grade myelodysplastic syndrome followed by Dr. Federico.  I have viewed most recent oncology note and lab work.    #Low-grade myelodysplastic syndrome  - Next appointment with oncologist is 10/25/23. He does injection and lab appointments in the interim.   #Lower Extremity Edema -Notable swelling in the left lower leg and foot, with some swelling also present in the right leg. The left hand is also swollen. LLE 2+ and RLE 1+. The patient has a history of taking Lasix  for leg swelling in 2021 per chart review. The patient is currently on amlodipine , which can cause swelling, but typically in both legs symmetrically. No signs of infection or cellulitis. -Start Lasix  to reduce fluid retention. Patient is hesitant to take this.  He agrees to once daily dosing so will prescribe 20mg  once daily for a course of 10 days. If swelling resolves he knows to discontinue lasix . Discussed possibility of hypotension and weakness with lasix . He knows to monitor BP closely and hold if symptomatic.  -Discussed his home  medications including amlodipine  and gabapentin  which can cause edema. Patient can follow up with PCP if any medications need to be changed. -Refer to cardiology for further evaluation if swelling does not improve or if it returns after initial improvement.  Discussed plan with Dr. Federico who agrees   Strict ED precautions discussed should symptoms worsen.   Heme/Onc History: Oncology History   No history exists.      Interval history-: Discussed the use of AI scribe software for clinical note transcription with the patient, who gave verbal consent to proceed.   Justin Jarrett Blinn Mickey. is a 83 y.o. male with pertinent  history of low-grade myelodysplastic syndrome  presenting to [facility] today with chief complaint of leg swelling. Patient is accompanied by family member who provides additional history.  He has swelling in his left leg and foot, first noticed on January 20th, which has progressively worsened. The left leg is now almost double the size of the right leg. He reports additional swelling and bruising in his left hand, which has previous ulnar nerve damage, limiting its function.  His right leg has swelling in the ankle and foot only. He has been unable to elevate the leg due to caregiving responsibilities for his spouse. He has not looked to see if swelling decrease upon waking up in the mornings.  He completed a course of Bactrim  for suspected cellulitis without improvement. An ultrasound last week showed no blood clots which was ordered by PCP. He has a history of taking Lasix  for leg swelling, though specifics are unclear, noting that it was many years ago. No chest pain or shortness of breath,  though he experiences normal exertional dyspnea during activities like pulling garbage cans.  No new medications recently.  ROS  All other systems are reviewed and are negative for acute change except as noted in the HPI.    Allergies  Allergen Reactions   Levofloxacin     REACTION:  hands pealed     Past Medical History:  Diagnosis Date   Alcoholism (HCC)    Dry 13 years   Basal cell carcinoma    Right Chest   BPH (benign prostatic hyperplasia)    Diverticulosis of colon    Dizzy spells    none recent   DJD (degenerative joint disease)    lower back   Elbow fracture, left 2009   Dr Celena   GERD (gastroesophageal reflux disease)    Glaucoma    History of TIAs 1 yrs ago   Hyperkeratosis    LBP (low back pain)    Macular degeneration    left wet right dry sees dr elner   Melanoma [facility]) 2009   x3 Dr Amy Jordan   Osteoarthritis    Restless leg syndrome    Thrombocytopenia (HCC) 11/18/2021   Tinnitus    Tobacco abuse    Uses walker    Wears glasses    reading   Wears partial dentures    upper     Past Surgical History:  Procedure Laterality Date   CATARACT EXTRACTION Bilateral 2018   HARDWARE REMOVAL Left 07/09/2020   Procedure: Left elbow hardware removal;  Surgeon: Sharl Selinda Dover, MD;  Location: [facility];  Service: Orthopedics;  Laterality: Left;  60 mins   mda     Dr. Elner wet injection   MELANOMA EXCISION  2009   x3   RECONSTRUCTION MEDIAL COLLATERAL LIGAMENT ELBOW W/ TENDON GRAFT  2009   Left, Dr Celena   TONSILLECTOMY  as child   TOTAL KNEE ARTHROPLASTY Right 11/09/2020   Procedure: TOTAL KNEE ARTHROPLASTY;  Surgeon: Ernie Cough, MD;  Location: [facility];  Service: Orthopedics;  Laterality: Right;    Social History   Socioeconomic History   Marital status: Married    Spouse name: Rollene Caldron   Number of children: 2   Years of education: Not on file   Highest education level: Not on file  Occupational History   Occupation: [employer] - [employer]    Employer: RETIRED   Occupation: Scientist, Research (physical Sciences)   Occupation: Games Developer  Tobacco Use   Smoking status: Former    Current packs/day: 0.00    Average packs/day: 3.0 packs/day for 38.0 years (114.0 ttl pk-yrs)    Types: Cigarettes    Start  date: 70    Quit date: 1986    Years since quitting: 39.1   Smokeless tobacco: Never   Tobacco comments:    states he quit May 15 1985  Vaping Use   Vaping status: Never Used  Substance and Sexual Activity   Alcohol  use: No    Comment: PREVIOUS - DRY 34yrs   Drug use: Not Currently   Sexual activity: Not Currently  Other Topics Concern   Not on file  Social History Narrative   Right handed   One story home   Drinks coffee q am      Son and wife lives with him-2025   Social Drivers of Health   Financial Resource Strain: Low Risk  (07/20/2023)   Overall Financial Resource Strain (CARDIA)    Difficulty of Paying Living Expenses: Not very  hard  Food Insecurity: No Food Insecurity (07/20/2023)   Hunger Vital Sign    Worried About Running Out of Food in the Last Year: Never true    Ran Out of Food in the Last Year: Never true  Transportation Needs: No Transportation Needs (07/20/2023)   PRAPARE - Administrator, Civil Service (Medical): No    Lack of Transportation (Non-Medical): No  Physical Activity: Inactive (07/20/2023)   Exercise Vital Sign    Days of Exercise per Week: 0 days    Minutes of Exercise per Session: 0 min  Stress: No Stress Concern Present (07/20/2023)   [employer] of Occupational Health - Occupational Stress Questionnaire    Feeling of Stress : Not at all  Social Connections: Moderately Isolated (07/20/2023)   Social Connection and Isolation Panel [NHANES]    Frequency of Communication with Friends and Family: More than three times a week    Frequency of Social Gatherings with Friends and Family: Three times a week    Attends Religious Services: Never    Active Member of Clubs or Organizations: No    Attends Banker Meetings: Never    Marital Status: Married  Catering Manager Violence: Not At Risk (07/20/2023)   Humiliation, Afraid, Rape, and Kick questionnaire    Fear of Current or Ex-Partner: No    Emotionally Abused: No     Physically Abused: No    Sexually Abused: No    Family History  Problem Relation Age of Onset   Cancer Mother        ?breast   Cancer Father        Lung     Current Outpatient Medications:    furosemide  (LASIX ) 20 MG tablet, Take 1 tablet (20 mg total) by mouth daily for 10 days. Take in the morning, Disp: 10 tablet, Rfl: 0   Acetaminophen  (TYLENOL  PO), Tylenol , Disp: , Rfl:    amLODipine  (NORVASC ) 5 MG tablet, TAKE 1 TABLET BY MOUTH DAILY, Disp: 90 tablet, Rfl: 3   calcium  carbonate (TUMS EX) 750 MG chewable tablet, Chew 1 tablet by mouth daily., Disp: , Rfl:    carbidopa -levodopa  (SINEMET  IR) 25-100 MG tablet, For breakthrough restless leg, you can take 1 tablet at bedtime.  OK to take extra tab as needed during the day. (Patient taking differently: Take 1 tablet by mouth at bedtime.), Disp: 100 tablet, Rfl: 3   Cholecalciferol  1000 UNITS tablet, Take 3,000 Units by mouth daily., Disp: , Rfl:    Cyanocobalamin  (VITAMIN B-12) 1000 MCG SUBL, DISSOLVE 2 TABLETS IN MOUTH EVERY DAY, Disp: 180 tablet, Rfl: 1   ferrous sulfate  325 (65 FE) MG tablet, Take 650 mg by mouth daily with breakfast., Disp: , Rfl:    finasteride  (PROSCAR ) 5 MG tablet, Take 1 tablet (5 mg total) by mouth daily., Disp: 90 tablet, Rfl: 1   gabapentin  (NEURONTIN ) 300 MG capsule, Take 1 capsule (300 mg total) by mouth 4 (four) times daily. Follow-up appt due in Sept must see provider for future refills, Disp: 360 capsule, Rfl: 1   magnesium  hydroxide (MILK OF MAGNESIA) 400 MG/5ML suspension, Take 15 mLs by mouth daily as needed for mild constipation., Disp: , Rfl:    meclizine  (ANTIVERT ) 12.5 MG tablet, TAKE 1 TABLET BY MOUTH 3 TIMES DAILY AS NEEDED FOR DIZZINESS., Disp: 90 tablet, Rfl: 2   Multiple Vitamins-Minerals (PRESERVISION AREDS PO), Take 2 tablets by mouth 2 (two) times daily., Disp: , Rfl:    Oxycodone  HCl 10 MG  TABS, Take 10 mg by mouth 5 (five) times daily as needed (pain). Typically only uses 4 times daily,  Disp: , Rfl:    Polyethyl Glycol-Propyl Glycol (SYSTANE OP), Place 1 drop into both eyes 2 (two) times daily as needed (dry eyes). [facility], Disp: , Rfl:    pravastatin  (PRAVACHOL ) 20 MG tablet, Take 1 tablet (20 mg total) by mouth daily., Disp: 90 tablet, Rfl: 1   rOPINIRole  (REQUIP ) 2 MG tablet, TAKE 1 TABLET BY MOUTH AT AT NOON AND 1 TAB AT 4PM AND 1 TAB AT BEDTIME, Disp: 270 tablet, Rfl: 1   tamsulosin  (FLOMAX ) 0.4 MG CAPS capsule, Take 1 capsule (0.4 mg total) by mouth in the morning., Disp: 90 capsule, Rfl: 1   terazosin  (HYTRIN ) 2 MG capsule, TAKE 1 CAPSULE BY MOUTH EVERYDAY AT BEDTIME, Disp: 90 capsule, Rfl: 1  PHYSICAL EXAM: ECOG FS:1 - Symptomatic but completely ambulatory    Vitals:   08/09/23 1036  BP: (!) 121/54  Pulse: 66  Resp: 18  Temp: 97.9 F (36.6 C)  TempSrc: Temporal  SpO2: 100%  Weight: 166 lb 3.2 oz (75.4 kg)  Height: 5' 8 (1.727 m)   Physical Exam Vitals and nursing note reviewed.  Constitutional:      Appearance: He is not ill-appearing or toxic-appearing.  HENT:     Head: Normocephalic.  Eyes:     Conjunctiva/sclera: Conjunctivae normal.  Cardiovascular:     Rate and Rhythm: Normal rate and regular rhythm.     Pulses: Normal pulses.          Dorsalis pedis pulses are 2+ on the right side and 2+ on the left side.     Heart sounds: Normal heart sounds.  Pulmonary:     Effort: Pulmonary effort is normal.     Breath sounds: Normal breath sounds.  Abdominal:     General: There is no distension.  Musculoskeletal:     Cervical back: Normal range of motion.     Right lower leg: 1+ Edema present.     Left lower leg: 2+ Edema present.     Comments: Please see media below.  Left hand with mild swelling of dorsum. Left arm is not edematous.   Skin:    General: Skin is warm and dry.  Neurological:     Mental Status: He is alert.           LABORATORY DATA: I have reviewed the data as listed    Latest Ref Rng & Units 08/02/2023   10:23 AM  07/19/2023    9:04 AM 07/05/2023    8:38 AM  CBC  WBC 4.0 - 10.5 K/uL 3.2  3.8  2.5   Hemoglobin 13.0 - 17.0 g/dL 9.9  9.8  88.8   Hematocrit 39.0 - 52.0 % 31.5  31.3  35.7   Platelets 150 - 400 K/uL 34  28  27         Latest Ref Rng & Units 08/02/2023   10:23 AM 07/19/2023    9:04 AM 07/05/2023    8:38 AM  CMP  Glucose 70 - 99 mg/dL 96  899  886   BUN 8 - 23 mg/dL 22  21  19    Creatinine 0.61 - 1.24 mg/dL 8.57  8.83  8.85   Sodium 135 - 145 mmol/L 139  141  142   Potassium 3.5 - 5.1 mmol/L 3.6  4.1  3.9   Chloride 98 - 111 mmol/L 107  108  108  CO2 22 - 32 mmol/L 25  29  27    Calcium  8.9 - 10.3 mg/dL 8.8  9.2  9.3   Total Protein 6.5 - 8.1 g/dL 6.7  6.6  6.8   Total Bilirubin 0.0 - 1.2 mg/dL 0.7  0.6  0.8   Alkaline Phos 38 - 126 U/L 57  54  54   AST 15 - 41 U/L 19  15  16    ALT 0 - 44 U/L 5  5  <5        RADIOGRAPHIC STUDIES (from last 24 hours if applicable) I have personally reviewed the radiological images as listed and agreed with the findings in the report. No results found.      Visit Diagnosis: 1. MDS (myelodysplastic syndrome) (HCC)   2. Bilateral lower extremity edema      Orders Placed This Encounter  Procedures   Ambulatory referral to Cardiology    Referral Priority:   Routine    Referral Type:   Consultation    Referral Reason:   Specialty Services Required    Number of Visits Requested:   1    All questions were answered. The patient knows to call the clinic with any problems, questions or concerns. No barriers to learning was detected.  A total of more than 30 minutes were spent on this encounter with face-to-face time and non-face-to-face time, including preparing to see the patient, ordering tests and/or medications, counseling the patient and coordination of care as outlined above.    Thank you for allowing me to participate in the care of this patient.    Renan Danese E  Walisiewicz, PA-C Department of Hematology/Oncology [facility] at [facility] Phone: [phone]  [phone]    08/09/2023 11:49 AM

## 2023-08-09 NOTE — Patient Instructions (Signed)
 Prescription sent to the pharmacy for Lasix .  This should help your leg swelling.  Recommend taking in the morning so you are not up frequently during the night using the bathroom.  Elevate your legs is much as possible and try wearing compression socks.  Lasix  can lower your blood pressure.  If you feel weak or dizzy please check your blood pressure and do not take Lasix  if the top number is below 110.  I am also sending a referral to cardiology.  The office should contact you to schedule an appointment.  If you do not hear from them within a week you can call and mention a referral was sent by the cancer center.

## 2023-08-10 ENCOUNTER — Encounter: Payer: Self-pay | Admitting: Hematology and Oncology

## 2023-08-16 ENCOUNTER — Inpatient Hospital Stay: Payer: PPO

## 2023-08-16 VITALS — BP 138/56 | HR 57 | Temp 97.7°F | Resp 17

## 2023-08-16 DIAGNOSIS — D469 Myelodysplastic syndrome, unspecified: Secondary | ICD-10-CM

## 2023-08-16 DIAGNOSIS — D4620 Refractory anemia with excess of blasts, unspecified: Secondary | ICD-10-CM | POA: Diagnosis not present

## 2023-08-16 LAB — CMP (CANCER CENTER ONLY)
ALT: 6 U/L (ref 0–44)
AST: 16 U/L (ref 15–41)
Albumin: 4 g/dL (ref 3.5–5.0)
Alkaline Phosphatase: 53 U/L (ref 38–126)
Anion gap: 4 — ABNORMAL LOW (ref 5–15)
BUN: 24 mg/dL — ABNORMAL HIGH (ref 8–23)
CO2: 29 mmol/L (ref 22–32)
Calcium: 8.7 mg/dL — ABNORMAL LOW (ref 8.9–10.3)
Chloride: 105 mmol/L (ref 98–111)
Creatinine: 1.25 mg/dL — ABNORMAL HIGH (ref 0.61–1.24)
GFR, Estimated: 57 mL/min — ABNORMAL LOW (ref >60)
Glucose, Bld: 117 mg/dL — ABNORMAL HIGH (ref 70–99)
Potassium: 3.7 mmol/L (ref 3.5–5.1)
Sodium: 138 mmol/L (ref 135–145)
Total Bilirubin: 0.7 mg/dL (ref 0.0–1.2)
Total Protein: 6.6 g/dL (ref 6.5–8.1)

## 2023-08-16 LAB — CBC WITH DIFFERENTIAL (CANCER CENTER ONLY)
Abs Immature Granulocytes: 0 10*3/uL (ref 0.00–0.07)
Basophils Absolute: 0 10*3/uL (ref 0.0–0.1)
Basophils Relative: 1 %
Eosinophils Absolute: 0 10*3/uL (ref 0.0–0.5)
Eosinophils Relative: 1 %
HCT: 33.2 % — ABNORMAL LOW (ref 39.0–52.0)
Hemoglobin: 10.4 g/dL — ABNORMAL LOW (ref 13.0–17.0)
Immature Granulocytes: 0 %
Lymphocytes Relative: 35 %
Lymphs Abs: 1.1 10*3/uL (ref 0.7–4.0)
MCH: 24.8 pg — ABNORMAL LOW (ref 26.0–34.0)
MCHC: 31.3 g/dL (ref 30.0–36.0)
MCV: 79.2 fL — ABNORMAL LOW (ref 80.0–100.0)
Monocytes Absolute: 0.8 10*3/uL (ref 0.1–1.0)
Monocytes Relative: 26 %
Neutro Abs: 1.2 10*3/uL — ABNORMAL LOW (ref 1.7–7.7)
Neutrophils Relative %: 37 %
Platelet Count: 25 10*3/uL — ABNORMAL LOW (ref 150–400)
RBC: 4.19 MIL/uL — ABNORMAL LOW (ref 4.22–5.81)
RDW: 17.2 % — ABNORMAL HIGH (ref 11.5–15.5)
WBC Count: 3.3 10*3/uL — ABNORMAL LOW (ref 4.0–10.5)
nRBC: 0 % (ref 0.0–0.2)

## 2023-08-16 MED ORDER — EPOETIN ALFA-EPBX 20000 UNIT/ML IJ SOLN
20000.0000 [IU] | Freq: Once | INTRAMUSCULAR | Status: AC
  Administered 2023-08-16: 20000 [IU] via SUBCUTANEOUS
  Filled 2023-08-16: qty 1

## 2023-08-21 DIAGNOSIS — H43812 Vitreous degeneration, left eye: Secondary | ICD-10-CM | POA: Diagnosis not present

## 2023-08-21 DIAGNOSIS — H401130 Primary open-angle glaucoma, bilateral, stage unspecified: Secondary | ICD-10-CM | POA: Diagnosis not present

## 2023-08-21 DIAGNOSIS — H353111 Nonexudative age-related macular degeneration, right eye, early dry stage: Secondary | ICD-10-CM | POA: Diagnosis not present

## 2023-08-21 DIAGNOSIS — H353221 Exudative age-related macular degeneration, left eye, with active choroidal neovascularization: Secondary | ICD-10-CM | POA: Diagnosis not present

## 2023-08-22 DIAGNOSIS — Z85828 Personal history of other malignant neoplasm of skin: Secondary | ICD-10-CM | POA: Diagnosis not present

## 2023-08-22 DIAGNOSIS — C44311 Basal cell carcinoma of skin of nose: Secondary | ICD-10-CM | POA: Diagnosis not present

## 2023-08-22 DIAGNOSIS — L570 Actinic keratosis: Secondary | ICD-10-CM | POA: Diagnosis not present

## 2023-08-22 DIAGNOSIS — D2371 Other benign neoplasm of skin of right lower limb, including hip: Secondary | ICD-10-CM | POA: Diagnosis not present

## 2023-08-22 DIAGNOSIS — C4442 Squamous cell carcinoma of skin of scalp and neck: Secondary | ICD-10-CM | POA: Diagnosis not present

## 2023-08-22 DIAGNOSIS — C44619 Basal cell carcinoma of skin of left upper limb, including shoulder: Secondary | ICD-10-CM | POA: Diagnosis not present

## 2023-08-22 DIAGNOSIS — L821 Other seborrheic keratosis: Secondary | ICD-10-CM | POA: Diagnosis not present

## 2023-08-24 ENCOUNTER — Other Ambulatory Visit: Payer: Self-pay | Admitting: Internal Medicine

## 2023-08-27 DIAGNOSIS — M5416 Radiculopathy, lumbar region: Secondary | ICD-10-CM | POA: Diagnosis not present

## 2023-08-27 DIAGNOSIS — M549 Dorsalgia, unspecified: Secondary | ICD-10-CM | POA: Diagnosis not present

## 2023-08-30 ENCOUNTER — Inpatient Hospital Stay: Payer: PPO

## 2023-08-30 VITALS — BP 147/67 | HR 72 | Temp 98.0°F | Resp 17

## 2023-08-30 DIAGNOSIS — D469 Myelodysplastic syndrome, unspecified: Secondary | ICD-10-CM

## 2023-08-30 DIAGNOSIS — D4620 Refractory anemia with excess of blasts, unspecified: Secondary | ICD-10-CM | POA: Diagnosis not present

## 2023-08-30 LAB — CBC WITH DIFFERENTIAL (CANCER CENTER ONLY)
Abs Immature Granulocytes: 0.02 10*3/uL (ref 0.00–0.07)
Basophils Absolute: 0 10*3/uL (ref 0.0–0.1)
Basophils Relative: 1 %
Eosinophils Absolute: 0 10*3/uL (ref 0.0–0.5)
Eosinophils Relative: 0 %
HCT: 32.6 % — ABNORMAL LOW (ref 39.0–52.0)
Hemoglobin: 10 g/dL — ABNORMAL LOW (ref 13.0–17.0)
Immature Granulocytes: 1 %
Lymphocytes Relative: 23 %
Lymphs Abs: 0.7 10*3/uL (ref 0.7–4.0)
MCH: 24.3 pg — ABNORMAL LOW (ref 26.0–34.0)
MCHC: 30.7 g/dL (ref 30.0–36.0)
MCV: 79.3 fL — ABNORMAL LOW (ref 80.0–100.0)
Monocytes Absolute: 0.9 10*3/uL (ref 0.1–1.0)
Monocytes Relative: 27 %
Neutro Abs: 1.5 10*3/uL — ABNORMAL LOW (ref 1.7–7.7)
Neutrophils Relative %: 48 %
Platelet Count: 23 10*3/uL — ABNORMAL LOW (ref 150–400)
RBC: 4.11 MIL/uL — ABNORMAL LOW (ref 4.22–5.81)
RDW: 17 % — ABNORMAL HIGH (ref 11.5–15.5)
WBC Count: 3.2 10*3/uL — ABNORMAL LOW (ref 4.0–10.5)
nRBC: 0 % (ref 0.0–0.2)

## 2023-08-30 LAB — CMP (CANCER CENTER ONLY)
ALT: 5 U/L (ref 0–44)
AST: 15 U/L (ref 15–41)
Albumin: 4.1 g/dL (ref 3.5–5.0)
Alkaline Phosphatase: 61 U/L (ref 38–126)
Anion gap: 5 (ref 5–15)
BUN: 19 mg/dL (ref 8–23)
CO2: 27 mmol/L (ref 22–32)
Calcium: 9.1 mg/dL (ref 8.9–10.3)
Chloride: 108 mmol/L (ref 98–111)
Creatinine: 1.09 mg/dL (ref 0.61–1.24)
GFR, Estimated: >60 mL/min (ref >60)
Glucose, Bld: 94 mg/dL (ref 70–99)
Potassium: 4.1 mmol/L (ref 3.5–5.1)
Sodium: 140 mmol/L (ref 135–145)
Total Bilirubin: 0.7 mg/dL (ref 0.0–1.2)
Total Protein: 6.9 g/dL (ref 6.5–8.1)

## 2023-08-30 MED ORDER — EPOETIN ALFA-EPBX 20000 UNIT/ML IJ SOLN
20000.0000 [IU] | Freq: Once | INTRAMUSCULAR | Status: AC
  Administered 2023-08-30: 20000 [IU] via SUBCUTANEOUS
  Filled 2023-08-30: qty 1

## 2023-09-03 ENCOUNTER — Telehealth: Payer: Self-pay | Admitting: Internal Medicine

## 2023-09-03 NOTE — Telephone Encounter (Signed)
 Patient is wanting to know if he needs to keep his upcoming appointment. Patient stated he no longer has swelling after taking 8 dosage of furosemide (LASIX) 20 MG tablet (Expired)

## 2023-09-03 NOTE — Telephone Encounter (Signed)
 Spoke to patient he stated he does not have any swelling in left lower leg.Stated he is doing good.Advised he will need to keep appointment scheduled with Rise Paganini NP 3/11 at 11:45.Advised to bring all medications.

## 2023-09-04 ENCOUNTER — Encounter: Payer: Self-pay | Admitting: Internal Medicine

## 2023-09-04 ENCOUNTER — Ambulatory Visit: Payer: PPO | Admitting: Internal Medicine

## 2023-09-04 ENCOUNTER — Ambulatory Visit (INDEPENDENT_AMBULATORY_CARE_PROVIDER_SITE_OTHER): Payer: PPO | Admitting: Internal Medicine

## 2023-09-04 VITALS — BP 112/60 | HR 72 | Temp 97.7°F | Ht 68.0 in | Wt 162.0 lb

## 2023-09-04 DIAGNOSIS — G2581 Restless legs syndrome: Secondary | ICD-10-CM | POA: Diagnosis not present

## 2023-09-04 DIAGNOSIS — E559 Vitamin D deficiency, unspecified: Secondary | ICD-10-CM | POA: Diagnosis not present

## 2023-09-04 DIAGNOSIS — F32A Depression, unspecified: Secondary | ICD-10-CM

## 2023-09-04 DIAGNOSIS — S51802S Unspecified open wound of left forearm, sequela: Secondary | ICD-10-CM

## 2023-09-04 DIAGNOSIS — S51809A Unspecified open wound of unspecified forearm, initial encounter: Secondary | ICD-10-CM | POA: Insufficient documentation

## 2023-09-04 NOTE — Progress Notes (Signed)
 Subjective:  Patient ID: Justin [name]., male    DOB: 1940/12/29  Age: 83 y.o. MRN: [id number]  CC: Medical Management of Chronic Issues (4 week f/u)   HPI Justin [name]. presents for HTN, L forearm wound x 2 wks, RLS  Outpatient Medications Prior to Visit  Medication Sig Dispense Refill   Acetaminophen (TYLENOL PO) Tylenol     amLODipine (NORVASC) 5 MG tablet TAKE 1 TABLET BY MOUTH DAILY 90 tablet 3   calcium carbonate (TUMS EX) 750 MG chewable tablet Chew 1 tablet by mouth daily.     carbidopa-levodopa (SINEMET IR) 25-100 MG tablet For breakthrough restless leg, you can take 1 tablet at bedtime.  OK to take extra tab as needed during the day. (Patient taking differently: Take 1 tablet by mouth at bedtime.) 100 tablet 3   Cholecalciferol 1000 UNITS tablet Take 3,000 Units by mouth daily.     Cyanocobalamin (VITAMIN B-12) 1000 MCG SUBL DISSOLVE 2 TABLETS IN MOUTH EVERY DAY 180 tablet 1   ferrous sulfate 325 (65 FE) MG tablet Take 650 mg by mouth daily with breakfast.     finasteride (PROSCAR) 5 MG tablet TAKE 1 TABLET (5 MG TOTAL) BY MOUTH DAILY. 90 tablet 1   gabapentin (NEURONTIN) 300 MG capsule Take 1 capsule (300 mg total) by mouth 4 (four) times daily. Follow-up appt due in Sept must see provider for future refills 360 capsule 1   magnesium hydroxide (MILK OF MAGNESIA) 400 MG/5ML suspension Take 15 mLs by mouth daily as needed for mild constipation.     meclizine (ANTIVERT) 12.5 MG tablet TAKE 1 TABLET BY MOUTH 3 TIMES DAILY AS NEEDED FOR DIZZINESS. 90 tablet 2   Multiple Vitamins-Minerals (PRESERVISION AREDS PO) Take 2 tablets by mouth 2 (two) times daily.     Oxycodone HCl 10 MG TABS Take 10 mg by mouth 5 (five) times daily as needed (pain). Typically only uses 4 times daily     Polyethyl Glycol-Propyl Glycol (SYSTANE OP) Place 1 drop into both eyes 2 (two) times daily as needed (dry eyes). [facility] O[facility]     pravastatin (PRAVACHOL) 20 MG tablet TAKE 1 TABLET (20 MG TOTAL)  BY MOUTH DAILY. 90 tablet 1   rOPINIRole (REQUIP) 2 MG tablet TAKE 1 TABLET BY MOUTH AT AT NOON AND 1 TAB AT 4PM AND 1 TAB AT BEDTIME 270 tablet 1   tamsulosin (FLOMAX) 0.4 MG CAPS capsule Take 1 capsule (0.4 mg total) by mouth in the morning. 90 capsule 1   terazosin (HYTRIN) 2 MG capsule TAKE 1 CAPSULE BY MOUTH EVERYDAY AT BEDTIME 90 capsule 1   furosemide (LASIX) 20 MG tablet Take 1 tablet (20 mg total) by mouth daily for 10 days. Take in the morning 10 tablet 0   No facility-administered medications prior to visit.    ROS: Review of Systems  Constitutional:  Negative for appetite change, fatigue and unexpected weight change.  HENT:  Negative for congestion, nosebleeds, sneezing, sore throat and trouble swallowing.   Eyes:  Negative for itching and visual disturbance.  Respiratory:  Negative for cough.   Cardiovascular:  Negative for chest pain, palpitations and leg swelling.  Gastrointestinal:  Negative for abdominal distention, blood in stool, diarrhea and nausea.  Genitourinary:  Negative for frequency and hematuria.  Musculoskeletal:  Positive for arthralgias, back pain and gait problem. Negative for joint swelling and neck pain.  Skin:  Negative for rash.  Neurological:  Negative for dizziness, tremors, speech difficulty and weakness.  Hematological:  Bruises/bleeds easily.  Psychiatric/Behavioral:  Negative for agitation, dysphoric mood, sleep disturbance and suicidal ideas. The patient is not nervous/anxious.     Objective:  BP 112/60   Pulse 72   Temp 97.7 F (36.5 C) (Oral)   Ht 5\' 8"  (1.727 m)   Wt 162 lb (73.5 kg)   SpO2 96%   BMI 24.63 kg/m   BP Readings from Last 3 Encounters:  09/04/23 112/60  08/30/23 (!) 147/67  08/16/23 (!) 138/56    Wt Readings from Last 3 Encounters:  09/04/23 162 lb (73.5 kg)  08/09/23 166 lb 3.2 oz (75.4 kg)  08/06/23 169 lb (76.7 kg)    Physical Exam Constitutional:      General: He is not in acute distress.    Appearance:  He is well-developed.     Comments: NAD  Eyes:     Conjunctiva/sclera: Conjunctivae normal.     Pupils: Pupils are equal, round, and reactive to light.  Neck:     Thyroid: No thyromegaly.     Vascular: No JVD.  Cardiovascular:     Rate and Rhythm: Normal rate and regular rhythm.     Heart sounds: Normal heart sounds. No murmur heard.    No friction rub. No gallop.  Pulmonary:     Effort: Pulmonary effort is normal. No respiratory distress.     Breath sounds: Normal breath sounds. No wheezing or rales.  Chest:     Chest wall: No tenderness.  Abdominal:     General: Bowel sounds are normal. There is no distension.     Palpations: Abdomen is soft. There is no mass.     Tenderness: There is no abdominal tenderness. There is no guarding or rebound.  Musculoskeletal:        General: No tenderness. Normal range of motion.     Cervical back: Normal range of motion.  Lymphadenopathy:     Cervical: No cervical adenopathy.  Skin:    General: Skin is warm and dry.     Findings: No rash.  Neurological:     Mental Status: He is alert and oriented to person, place, and time.     Cranial Nerves: No cranial nerve deficit.     Motor: No abnormal muscle tone.     Coordination: Coordination normal.     Gait: Gait normal.     Deep Tendon Reflexes: Reflexes are normal and symmetric.  Psychiatric:        Behavior: Behavior normal.        Thought Content: Thought content normal.        Judgment: Judgment normal.   Bruises L forearm w/2 wounds oozing blood-dressed  Lab Results  Component Value Date   WBC 3.2 (L) 08/30/2023   HGB 10.0 (L) 08/30/2023   HCT 32.6 (L) 08/30/2023   PLT 23 (L) 08/30/2023   GLUCOSE 94 08/30/2023   CHOL 136 01/12/2017   TRIG 67.0 01/12/2017   HDL 54.70 01/12/2017   LDLCALC 68 01/12/2017   ALT 5 08/30/2023   AST 15 08/30/2023   NA 140 08/30/2023   K 4.1 08/30/2023   CL 108 08/30/2023   CREATININE 1.09 08/30/2023   BUN 19 08/30/2023   CO2 27 08/30/2023    TSH 3.05 10/16/2019   PSA 0.47 08/30/2016   INR 1.1 10/28/2020   HGBA1C 5.3 04/19/2021    VAS [place] LOWER EXTREMITY VENOUS (DVT) Result Date: 08/01/2023  Lower Venous DVT Study Patient Name:  Justin [name].  Date  of Exam:   07/31/2023 Medical Rec #: [id number]               Accession #:    [id number] Date of Birth: 1941-01-15               Patient Gender: M Patient Age:   51 years Exam Location:  [facility] Procedure:      VAS [place] LOWER EXTREMITY VENOUS (DVT) Referring Phys: Lanora Manis CRAWFORD --------------------------------------------------------------------------------  Indications: Increase lower leg swelling, edema and tenderness x 1 week. Patient denies any SOB.  Risk Factors: None identified. Comparison Study: NA Performing Technologist: Tyna Jaksch RVT  Examination Guidelines: A complete evaluation includes B-mode imaging, spectral Doppler, color Doppler, and power Doppler as needed of all accessible portions of each vessel. Bilateral testing is considered an integral part of a complete examination. Limited examinations for reoccurring indications may be performed as noted. The reflux portion of the exam is performed with the patient in reverse Trendelenburg.  +-----+---------------+---------+-----------+----------+--------------+ RIGHTCompressibilityPhasicitySpontaneityPropertiesThrombus Aging +-----+---------------+---------+-----------+----------+--------------+ CFV  Full           Yes      Yes                                 +-----+---------------+---------+-----------+----------+--------------+   +---------+---------------+---------+-----------+----------+--------------+ LEFT     CompressibilityPhasicitySpontaneityPropertiesThrombus Aging +---------+---------------+---------+-----------+----------+--------------+ CFV      Full           Yes      Yes                                 +---------+---------------+---------+-----------+----------+--------------+ SFJ       Full           Yes      Yes                                 +---------+---------------+---------+-----------+----------+--------------+ FV Prox  Full           Yes      Yes                                 +---------+---------------+---------+-----------+----------+--------------+ FV Mid   Full                                                        +---------+---------------+---------+-----------+----------+--------------+ FV DistalFull           Yes      Yes                                 +---------+---------------+---------+-----------+----------+--------------+ PFV      Full                    Yes                                 +---------+---------------+---------+-----------+----------+--------------+ POP      Full           Yes  Yes                                 +---------+---------------+---------+-----------+----------+--------------+ PTV      Full                                                        +---------+---------------+---------+-----------+----------+--------------+ PERO     Full                                                        +---------+---------------+---------+-----------+----------+--------------+ Gastroc  Full                                                        +---------+---------------+---------+-----------+----------+--------------+ GSV      Full           Yes      Yes                                 +---------+---------------+---------+-----------+----------+--------------+    Findings reported to Dr. Okey Dupre via Epic messaging at 3:36 pm.  Summary: RIGHT: - No evidence of common femoral vein obstruction.   LEFT: - No evidence of deep vein thrombosis in the lower extremity. No indirect evidence of obstruction proximal to the inguinal ligament. - No cystic structure found in the popliteal fossa.  *See table(s) above for measurements and observations. Electronically signed by Lemar Livings MD on 08/01/2023  at 8:12:50 AM.    Final     Assessment & Plan:   Problem List Items Addressed This Visit     Vitamin D deficiency   On vit D      Depression   Doing fair      RLS (restless legs syndrome)    Cont w/Requip, gabapentin, Sinemet      Wound, open, forearm - Primary   DuoDERM patch applied.  Pressure dressing with Coban wrap over gauze         No orders of the defined types were placed in this encounter.     Follow-up: Return in about 6 months (around 03/06/2024) for a follow-up visit.  Sonda Primes, MD

## 2023-09-04 NOTE — Assessment & Plan Note (Signed)
Cont w/Requip, gabapentin, Sinemet ?

## 2023-09-04 NOTE — Assessment & Plan Note (Signed)
Doing fair 

## 2023-09-04 NOTE — Assessment & Plan Note (Signed)
On vit D 

## 2023-09-07 ENCOUNTER — Ambulatory Visit: Payer: PPO | Admitting: Podiatry

## 2023-09-09 ENCOUNTER — Encounter: Payer: Self-pay | Admitting: Internal Medicine

## 2023-09-09 NOTE — Assessment & Plan Note (Signed)
 DuoDERM patch applied.  Pressure dressing with Coban wrap over gauze

## 2023-09-11 ENCOUNTER — Ambulatory Visit: Payer: PPO | Attending: Emergency Medicine | Admitting: Emergency Medicine

## 2023-09-11 ENCOUNTER — Encounter: Payer: Self-pay | Admitting: Emergency Medicine

## 2023-09-11 VITALS — BP 112/66 | HR 74 | Ht 69.0 in | Wt 162.4 lb

## 2023-09-11 DIAGNOSIS — I10 Essential (primary) hypertension: Secondary | ICD-10-CM | POA: Diagnosis not present

## 2023-09-11 DIAGNOSIS — R600 Localized edema: Secondary | ICD-10-CM

## 2023-09-11 DIAGNOSIS — I2510 Atherosclerotic heart disease of native coronary artery without angina pectoris: Secondary | ICD-10-CM | POA: Diagnosis not present

## 2023-09-11 DIAGNOSIS — I5189 Other ill-defined heart diseases: Secondary | ICD-10-CM

## 2023-09-11 MED ORDER — FUROSEMIDE 20 MG PO TABS: 20.0000 mg | ORAL_TABLET | Freq: Every day | ORAL | 6 refills | Status: DC

## 2023-09-11 MED ORDER — FUROSEMIDE 20 MG PO TABS: ORAL_TABLET | ORAL | 6 refills | Status: DC

## 2023-09-11 NOTE — Progress Notes (Addendum)
 Cardiology Office Note:    Date:  09/11/2023  ID:  Justin [name]., DOB [birth year], MRN [id number] PCP: Genia Kettering, MD  [facility] Providers Cardiologist:  Euell Herrlich, MD       Patient Profile:      Chief Complaint: Acute visit for leg swelling  History of Present Illness:  Justin [name]. is a 83 y.o. male with visit-pertinent history of TIA, GERD, macular degeneration, coronary artery disease, myelodysplastic syndrome  Lexiscan stress test 08/08/2017 was overall low risk with no ischemia identified.  CT cardiac scoring 07/23/2018 showed coronary calcium score of 41 (15 percentile).  Echocardiogram 10/22/2019 showed LVEF 60-65%, no RWMA, mild concentric LVH, grade 2 diastolic dysfunction, RV function and size normal, mildly elevated PASP, left atrial size moderately dilated, trivial MR.  Patient established with cardiology service on 11/22/2021 for chest pain.  This was the last time he was seen in clinic.  Patient noted chest pain for 2 days after he ate pizza for which he felt like it was related to indigestion.  Notes the pain radiated to his jaw and lasted about 20 minutes.  No exertional chest discomfort or DOE.  He was offered stress testing however defer cardiac testing at that time, felt it was situational and GI in origin.   Discussed the use of AI scribe software for clinical note transcription with the patient, who gave verbal consent to proceed.  Today the patient presents for evaluation of recent left lower extremity swelling.  Today he is without any acute cardiovascular concerns or complaints.  The swelling was primarily to the left leg, started 2 to 3 months ago was noted to be severe.  The swelling has since resolved after a short course of furosemide (prescribed 8 days of 20 mg Lasix) by his primary care provider. He denies any associated chest pain, syncope, palpitations, lightheadedness, dizziness, weight gain, orthopnea, PND or dyspnea.      Review of systems:  Please see the history of present illness. All other systems are reviewed and otherwise negative.     Home Medications:    Current Meds  Medication Sig   Acetaminophen (TYLENOL PO) Tylenol   amLODipine (NORVASC) 5 MG tablet TAKE 1 TABLET BY MOUTH DAILY   calcium carbonate (TUMS EX) 750 MG chewable tablet Chew 1 tablet by mouth daily.   carbidopa-levodopa (SINEMET IR) 25-100 MG tablet For breakthrough restless leg, you can take 1 tablet at bedtime.  OK to take extra tab as needed during the day. (Patient taking differently: Take 1 tablet by mouth at bedtime.)   Cholecalciferol 1000 UNITS tablet Take 3,000 Units by mouth daily.   Cyanocobalamin (VITAMIN B-12) 1000 MCG SUBL DISSOLVE 2 TABLETS IN MOUTH EVERY DAY   ferrous sulfate 325 (65 FE) MG tablet Take 650 mg by mouth daily with breakfast.   finasteride (PROSCAR) 5 MG tablet TAKE 1 TABLET (5 MG TOTAL) BY MOUTH DAILY.   furosemide (LASIX) 20 MG tablet Take 1 tablet (20 mg total) by mouth daily.   gabapentin (NEURONTIN) 300 MG capsule Take 1 capsule (300 mg total) by mouth 4 (four) times daily. Follow-up appt due in Sept must see provider for future refills   magnesium hydroxide (MILK OF MAGNESIA) 400 MG/5ML suspension Take 15 mLs by mouth daily as needed for mild constipation.   meclizine (ANTIVERT) 12.5 MG tablet TAKE 1 TABLET BY MOUTH 3 TIMES DAILY AS NEEDED FOR DIZZINESS.   Multiple Vitamins-Minerals (PRESERVISION AREDS PO) Take 2 tablets  by mouth 2 (two) times daily.   Oxycodone HCl 10 MG TABS Take 10 mg by mouth 5 (five) times daily as needed (pain). Typically only uses 4 times daily   Polyethyl Glycol-Propyl Glycol (SYSTANE OP) Place 1 drop into both eyes 2 (two) times daily as needed (dry eyes). [facility] O[facility]   pravastatin (PRAVACHOL) 20 MG tablet TAKE 1 TABLET (20 MG TOTAL) BY MOUTH DAILY.   rOPINIRole (REQUIP) 2 MG tablet TAKE 1 TABLET BY MOUTH AT AT NOON AND 1 TAB AT 4PM AND 1 TAB AT BEDTIME   tamsulosin (FLOMAX)  0.4 MG CAPS capsule Take 1 capsule (0.4 mg total) by mouth in the morning.   terazosin (HYTRIN) 2 MG capsule TAKE 1 CAPSULE BY MOUTH EVERYDAY AT BEDTIME   Studies Reviewed:   EKG Interpretation Date/Time:  Tuesday September 11 2023 11:41:30 EDT Ventricular Rate:  74 PR Interval:  156 QRS Duration:  96 QT Interval:  398 QTC Calculation: 441 R Axis:   -32  Text Interpretation: Normal sinus rhythm with sinus arrhythmia Left axis deviation Incomplete right bundle branch block Confirmed by Palmer Bobo [phone]) on 09/11/2023 12:12:12 PM    Vascular ultrasound lower extremity 07/31/2023 LEFT:  - No evidence of deep vein thrombosis in the lower extremity. No indirect  evidence of obstruction proximal to the inguinal ligament.  - No cystic structure found in the popliteal fossa.   Echocardiogram 10/22/2019  1. Left ventricular ejection fraction, by estimation, is 60 to 65%. The  left ventricle has normal function. The left ventricle has no regional  wall motion abnormalities. There is mild concentric left ventricular  hypertrophy. Left ventricular diastolic  parameters are consistent with Grade II diastolic dysfunction  (pseudonormalization).   2. Right ventricular systolic function is normal. The right ventricular  size is normal. There is mildly elevated pulmonary artery systolic  pressure.   3. Left atrial size was moderately dilated.   4. Right atrial size was mildly dilated.   5. The mitral valve is normal in structure. Trivial mitral valve  regurgitation. No evidence of mitral stenosis.   6. The aortic valve is tricuspid. Aortic valve regurgitation is not  visualized. No aortic stenosis is present.   7. The inferior vena cava is normal in size with greater than 50%  respiratory variability, suggesting right atrial pressure of 3 mmHg.   Risk Assessment/Calculations:             Physical Exam:   VS:  BP 112/66   Pulse 74   Ht 5\' 9"  (1.753 m)   Wt 162 lb 6.4 oz (73.7 kg)    SpO2 97%   BMI 23.98 kg/m    Wt Readings from Last 3 Encounters:  09/11/23 162 lb 6.4 oz (73.7 kg)  09/04/23 162 lb (73.5 kg)  08/09/23 166 lb 3.2 oz (75.4 kg)    GEN: Well nourished, well developed in no acute distress NECK: No JVD; No carotid bruits CARDIAC: RRR, no murmurs, rubs, gallops RESPIRATORY:  Clear to auscultation without rales, wheezing or rhonchi  ABDOMEN: Soft, non-tender, non-distended EXTREMITIES: 2+ LLE and 1+ RLE; No acute deformity      Assessment and Plan:  Coronary artery disease Lexiscan stress test 2/19 was overall low risk with no ischemia identified.  CT cardiac scoring 07/2018 showed coronary calcium score of 41 (15th percentile) -Today he is without any anginal symptoms, no indication for further ischemic evaluation at this time -No LDL labs to review today is been managed by PCP. -EKG today  shows NSR with RBBB with no acute ST or T wave changes -Continue pravastatin 20 mg daily -Recommend DASH diet (high in vegetables, fruits, low-fat dairy products, whole grains, poultry, fish, and nuts and low in sweets, sugar-sweetened beverages, and red meats), salt restriction and increase physical activity.   Hypertension Blood pressure today 112/66 and under excellent control -I do not feel his lower extremity swelling is coming from his amlodipine -Continue amlodipine 5 mg daily and terazosin 2mg  daily   Lower extremity edema / Diastolic dysfunction  Echocardiogram 10/2019 showed LVEF 60 to 65%, no RWMA, mild LVH, grade 2 DD, normal RV size and function, mildly elevated PASP. Noted to have severe left lower extremity edema 2 to 3 months ago.  Patient notes this resolved after short 8-day course of Lasix and has since not returned.  He denied any associated symptoms including DOE, SOB, orthopnea, PND, weight gain. -Lower extremity ultrasound 07/31/2023 was negative for DVT. -Today he is euvolemic and well compensated on exam. -Physical exam shows 2+ lower extremity  edema to LLE and 1+ to RLE -Will update echocardiogram today to evaluate for any LV dysfunction or any valvular abnormalities causing his new onset edema -Will prescribe him Lasix 20 mg as needed for weight gain of 3 pounds in 1 day or 5 pounds in 1 week            Dispo:  Return in about 1 year (around 09/10/2024).  Signed, Ava Boatman, NP

## 2023-09-11 NOTE — Patient Instructions (Signed)
 Medication Instructions:  START TAKING LASIX 20 MG AS NEEDED FOR 3 POUND WEIGHT GAIN OVER NIGHT AND 5 POUND WEIGHT GAIN IN A WEEK.    Lab Work: NONE    Testing/Procedures: Your physician has requested that you have an ECHOCARDIOGRAM. Echocardiography is a painless test that uses sound waves to create images of your heart. It provides your doctor with information about the size and shape of your heart and how well your heart's chambers and valves are working. This procedure takes approximately one hour. There are no restrictions for this procedure. Please do NOT wear cologne, perfume, aftershave, or lotions (deodorant is allowed). Please arrive 15 minutes prior to your appointment time.  Please note: We ask at that you not bring children with you during ultrasound (echo/ vascular) testing. Due to room size and safety concerns, children are not allowed in the ultrasound rooms during exams. Our front office staff cannot provide observation of children in our lobby area while testing is being conducted. An adult accompanying a patient to their appointment will only be allowed in the ultrasound room at the discretion of the ultrasound technician under special circumstances. We apologize for any inconvenience.    Follow-Up: At [facility], you and your health needs are our priority.  As part of our continuing mission to provide you with exceptional heart care, we have created designated Provider Care Teams.  These Care Teams include your primary Cardiologist (physician) and Advanced Practice Providers (APPs -  Physician Assistants and Nurse Practitioners) who all work together to provide you with the care you need, when you need it.      Your next appointment:   1 YEAR  Provider:   Rise Paganini, DNP   Other Instructions:

## 2023-09-13 ENCOUNTER — Inpatient Hospital Stay: Payer: PPO

## 2023-09-13 ENCOUNTER — Inpatient Hospital Stay: Payer: PPO | Attending: Physician Assistant

## 2023-09-13 VITALS — BP 142/57 | HR 77 | Temp 97.9°F | Resp 16

## 2023-09-13 DIAGNOSIS — D469 Myelodysplastic syndrome, unspecified: Secondary | ICD-10-CM

## 2023-09-13 DIAGNOSIS — D4620 Refractory anemia with excess of blasts, unspecified: Secondary | ICD-10-CM | POA: Diagnosis not present

## 2023-09-13 LAB — CMP (CANCER CENTER ONLY)
ALT: 6 U/L (ref 0–44)
AST: 16 U/L (ref 15–41)
Albumin: 4.1 g/dL (ref 3.5–5.0)
Alkaline Phosphatase: 55 U/L (ref 38–126)
Anion gap: 4 — ABNORMAL LOW (ref 5–15)
BUN: 24 mg/dL — ABNORMAL HIGH (ref 8–23)
CO2: 26 mmol/L (ref 22–32)
Calcium: 8.6 mg/dL — ABNORMAL LOW (ref 8.9–10.3)
Chloride: 108 mmol/L (ref 98–111)
Creatinine: 1.19 mg/dL (ref 0.61–1.24)
GFR, Estimated: >60 mL/min (ref >60)
Glucose, Bld: 104 mg/dL — ABNORMAL HIGH (ref 70–99)
Potassium: 4.1 mmol/L (ref 3.5–5.1)
Sodium: 138 mmol/L (ref 135–145)
Total Bilirubin: 0.6 mg/dL (ref 0.0–1.2)
Total Protein: 7 g/dL (ref 6.5–8.1)

## 2023-09-13 LAB — CBC WITH DIFFERENTIAL (CANCER CENTER ONLY)
Abs Immature Granulocytes: 0.01 10*3/uL (ref 0.00–0.07)
Basophils Absolute: 0 10*3/uL (ref 0.0–0.1)
Basophils Relative: 1 %
Eosinophils Absolute: 0 10*3/uL (ref 0.0–0.5)
Eosinophils Relative: 1 %
HCT: 33 % — ABNORMAL LOW (ref 39.0–52.0)
Hemoglobin: 10 g/dL — ABNORMAL LOW (ref 13.0–17.0)
Immature Granulocytes: 0 %
Lymphocytes Relative: 34 %
Lymphs Abs: 1.1 10*3/uL (ref 0.7–4.0)
MCH: 24 pg — ABNORMAL LOW (ref 26.0–34.0)
MCHC: 30.3 g/dL (ref 30.0–36.0)
MCV: 79.3 fL — ABNORMAL LOW (ref 80.0–100.0)
Monocytes Absolute: 0.8 10*3/uL (ref 0.1–1.0)
Monocytes Relative: 26 %
Neutro Abs: 1.2 10*3/uL — ABNORMAL LOW (ref 1.7–7.7)
Neutrophils Relative %: 38 %
Platelet Count: 32 10*3/uL — ABNORMAL LOW (ref 150–400)
RBC: 4.16 MIL/uL — ABNORMAL LOW (ref 4.22–5.81)
RDW: 17 % — ABNORMAL HIGH (ref 11.5–15.5)
WBC Count: 3.2 10*3/uL — ABNORMAL LOW (ref 4.0–10.5)
nRBC: 0 % (ref 0.0–0.2)

## 2023-09-13 MED ORDER — EPOETIN ALFA-EPBX 20000 UNIT/ML IJ SOLN
20000.0000 [IU] | Freq: Once | INTRAMUSCULAR | Status: AC
  Administered 2023-09-13: 20000 [IU] via SUBCUTANEOUS
  Filled 2023-09-13: qty 1

## 2023-09-20 ENCOUNTER — Other Ambulatory Visit: Payer: Self-pay | Admitting: Internal Medicine

## 2023-09-20 DIAGNOSIS — H353221 Exudative age-related macular degeneration, left eye, with active choroidal neovascularization: Secondary | ICD-10-CM | POA: Diagnosis not present

## 2023-09-20 DIAGNOSIS — H401130 Primary open-angle glaucoma, bilateral, stage unspecified: Secondary | ICD-10-CM | POA: Diagnosis not present

## 2023-09-20 DIAGNOSIS — H353111 Nonexudative age-related macular degeneration, right eye, early dry stage: Secondary | ICD-10-CM | POA: Diagnosis not present

## 2023-09-20 DIAGNOSIS — H43812 Vitreous degeneration, left eye: Secondary | ICD-10-CM | POA: Diagnosis not present

## 2023-09-24 ENCOUNTER — Ambulatory Visit: Payer: PPO | Admitting: Podiatry

## 2023-09-24 DIAGNOSIS — M79675 Pain in left toe(s): Secondary | ICD-10-CM

## 2023-09-24 DIAGNOSIS — H353114 Nonexudative age-related macular degeneration, right eye, advanced atrophic with subfoveal involvement: Secondary | ICD-10-CM | POA: Diagnosis not present

## 2023-09-24 DIAGNOSIS — Q828 Other specified congenital malformations of skin: Secondary | ICD-10-CM

## 2023-09-24 DIAGNOSIS — M79674 Pain in right toe(s): Secondary | ICD-10-CM

## 2023-09-24 DIAGNOSIS — G629 Polyneuropathy, unspecified: Secondary | ICD-10-CM | POA: Diagnosis not present

## 2023-09-24 DIAGNOSIS — H402222 Chronic angle-closure glaucoma, left eye, moderate stage: Secondary | ICD-10-CM | POA: Diagnosis not present

## 2023-09-24 DIAGNOSIS — H04123 Dry eye syndrome of bilateral lacrimal glands: Secondary | ICD-10-CM | POA: Diagnosis not present

## 2023-09-24 DIAGNOSIS — H353221 Exudative age-related macular degeneration, left eye, with active choroidal neovascularization: Secondary | ICD-10-CM | POA: Diagnosis not present

## 2023-09-24 DIAGNOSIS — B351 Tinea unguium: Secondary | ICD-10-CM | POA: Diagnosis not present

## 2023-09-24 DIAGNOSIS — H402211 Chronic angle-closure glaucoma, right eye, mild stage: Secondary | ICD-10-CM | POA: Diagnosis not present

## 2023-09-24 DIAGNOSIS — H524 Presbyopia: Secondary | ICD-10-CM | POA: Diagnosis not present

## 2023-09-26 NOTE — Progress Notes (Signed)
 Subjective: Chief Complaint  Patient presents with   RFC    RM#13 RFC      83 y.o. returns the office today for painful, elongated, thickened toenails which he cannot trim himself and also a callus at the tip of the second toe on the left. No open lesions, swelling, redness or signs of infection he reports.   PCP: Plotnikov, Georgina Quint, MD  Objective: AAO 3, NAD DP/PT pulses palpable, CRT less than 3 seconds Neurological status is unchanged.  Nails hypertrophic, dystrophic, elongated, brittle, discolored 10. There is tenderness overlying the nails 1-5 bilaterally. There is no surrounding erythema or drainage along the nail sites. Hyperkeratotic lesion left distal second toe without any underlying ulceration drainage or signs of infection.  No new calluses are noted. Hammertoes are present. No pain with calf compression, swelling, warmth, erythema.  Assessment: Patient presents with symptomatic onychomycosis; hyperkeratotic lesion   Plan: -Treatment options including alternatives, risks, complications were discussed -Nails sharply debrided 10 without complication/bleeding. -Hyperkeratotic lesion sharply debrided x 1 without any complications.  Continue offloading pads. Briefly discussed surgery again to help, will continue conservative treatment. Offloading.  -Neuropathy is stable, will continue to monitor   Return in about 3 months (around 12/25/2023).  Vivi Barrack DPM

## 2023-09-27 ENCOUNTER — Inpatient Hospital Stay: Payer: PPO

## 2023-09-27 VITALS — BP 150/73 | HR 69 | Temp 97.8°F | Resp 16

## 2023-09-27 DIAGNOSIS — D469 Myelodysplastic syndrome, unspecified: Secondary | ICD-10-CM

## 2023-09-27 DIAGNOSIS — D4620 Refractory anemia with excess of blasts, unspecified: Secondary | ICD-10-CM | POA: Diagnosis not present

## 2023-09-27 LAB — CBC WITH DIFFERENTIAL (CANCER CENTER ONLY)
Abs Immature Granulocytes: 0.02 10*3/uL (ref 0.00–0.07)
Basophils Absolute: 0 10*3/uL (ref 0.0–0.1)
Basophils Relative: 1 %
Eosinophils Absolute: 0 10*3/uL (ref 0.0–0.5)
Eosinophils Relative: 1 %
HCT: 33.7 % — ABNORMAL LOW (ref 39.0–52.0)
Hemoglobin: 10.4 g/dL — ABNORMAL LOW (ref 13.0–17.0)
Immature Granulocytes: 1 %
Lymphocytes Relative: 35 %
Lymphs Abs: 1 10*3/uL (ref 0.7–4.0)
MCH: 24.4 pg — ABNORMAL LOW (ref 26.0–34.0)
MCHC: 30.9 g/dL (ref 30.0–36.0)
MCV: 79.1 fL — ABNORMAL LOW (ref 80.0–100.0)
Monocytes Absolute: 0.7 10*3/uL (ref 0.1–1.0)
Monocytes Relative: 26 %
Neutro Abs: 1 10*3/uL — ABNORMAL LOW (ref 1.7–7.7)
Neutrophils Relative %: 36 %
Platelet Count: 31 10*3/uL — ABNORMAL LOW (ref 150–400)
RBC: 4.26 MIL/uL (ref 4.22–5.81)
RDW: 17.2 % — ABNORMAL HIGH (ref 11.5–15.5)
WBC Count: 2.8 10*3/uL — ABNORMAL LOW (ref 4.0–10.5)
nRBC: 0 % (ref 0.0–0.2)

## 2023-09-27 LAB — CMP (CANCER CENTER ONLY)
ALT: <5 U/L (ref 0–44)
AST: 16 U/L (ref 15–41)
Albumin: 4.2 g/dL (ref 3.5–5.0)
Alkaline Phosphatase: 59 U/L (ref 38–126)
Anion gap: 5 (ref 5–15)
BUN: 20 mg/dL (ref 8–23)
CO2: 27 mmol/L (ref 22–32)
Calcium: 8.9 mg/dL (ref 8.9–10.3)
Chloride: 107 mmol/L (ref 98–111)
Creatinine: 1.09 mg/dL (ref 0.61–1.24)
GFR, Estimated: >60 mL/min (ref >60)
Glucose, Bld: 95 mg/dL (ref 70–99)
Potassium: 3.9 mmol/L (ref 3.5–5.1)
Sodium: 139 mmol/L (ref 135–145)
Total Bilirubin: 0.8 mg/dL (ref 0.0–1.2)
Total Protein: 7 g/dL (ref 6.5–8.1)

## 2023-09-27 MED ORDER — EPOETIN ALFA-EPBX 20000 UNIT/ML IJ SOLN
20000.0000 [IU] | Freq: Once | INTRAMUSCULAR | Status: AC
  Administered 2023-09-27: 20000 [IU] via SUBCUTANEOUS
  Filled 2023-09-27: qty 1

## 2023-10-03 DIAGNOSIS — Z85828 Personal history of other malignant neoplasm of skin: Secondary | ICD-10-CM | POA: Diagnosis not present

## 2023-10-03 DIAGNOSIS — C44311 Basal cell carcinoma of skin of nose: Secondary | ICD-10-CM | POA: Diagnosis not present

## 2023-10-03 DIAGNOSIS — C4442 Squamous cell carcinoma of skin of scalp and neck: Secondary | ICD-10-CM | POA: Diagnosis not present

## 2023-10-09 ENCOUNTER — Ambulatory Visit: Payer: PPO | Admitting: Internal Medicine

## 2023-10-11 ENCOUNTER — Other Ambulatory Visit: Payer: Self-pay

## 2023-10-11 ENCOUNTER — Inpatient Hospital Stay: Payer: PPO

## 2023-10-11 ENCOUNTER — Inpatient Hospital Stay: Payer: PPO | Attending: Physician Assistant

## 2023-10-11 VITALS — BP 155/81 | HR 67 | Temp 98.6°F | Resp 17

## 2023-10-11 DIAGNOSIS — D469 Myelodysplastic syndrome, unspecified: Secondary | ICD-10-CM

## 2023-10-11 DIAGNOSIS — D4620 Refractory anemia with excess of blasts, unspecified: Secondary | ICD-10-CM | POA: Insufficient documentation

## 2023-10-11 DIAGNOSIS — G2581 Restless legs syndrome: Secondary | ICD-10-CM

## 2023-10-11 DIAGNOSIS — I10 Essential (primary) hypertension: Secondary | ICD-10-CM

## 2023-10-11 LAB — CMP (CANCER CENTER ONLY)
ALT: 5 U/L (ref 0–44)
AST: 13 U/L — ABNORMAL LOW (ref 15–41)
Albumin: 3.8 g/dL (ref 3.5–5.0)
Alkaline Phosphatase: 64 U/L (ref 38–126)
Anion gap: 7 (ref 5–15)
BUN: 17 mg/dL (ref 8–23)
CO2: 26 mmol/L (ref 22–32)
Calcium: 8.7 mg/dL — ABNORMAL LOW (ref 8.9–10.3)
Chloride: 106 mmol/L (ref 98–111)
Creatinine: 1.05 mg/dL (ref 0.61–1.24)
GFR, Estimated: >60 mL/min (ref >60)
Glucose, Bld: 114 mg/dL — ABNORMAL HIGH (ref 70–99)
Potassium: 3.5 mmol/L (ref 3.5–5.1)
Sodium: 139 mmol/L (ref 135–145)
Total Bilirubin: 0.7 mg/dL (ref 0.0–1.2)
Total Protein: 6.8 g/dL (ref 6.5–8.1)

## 2023-10-11 LAB — CBC WITH DIFFERENTIAL (CANCER CENTER ONLY)
Abs Immature Granulocytes: 0.01 10*3/uL (ref 0.00–0.07)
Basophils Absolute: 0 10*3/uL (ref 0.0–0.1)
Basophils Relative: 0 %
Eosinophils Absolute: 0 10*3/uL (ref 0.0–0.5)
Eosinophils Relative: 0 %
HCT: 28.2 % — ABNORMAL LOW (ref 39.0–52.0)
Hemoglobin: 8.8 g/dL — ABNORMAL LOW (ref 13.0–17.0)
Immature Granulocytes: 0 %
Lymphocytes Relative: 16 %
Lymphs Abs: 0.7 10*3/uL (ref 0.7–4.0)
MCH: 24.1 pg — ABNORMAL LOW (ref 26.0–34.0)
MCHC: 31.2 g/dL (ref 30.0–36.0)
MCV: 77.3 fL — ABNORMAL LOW (ref 80.0–100.0)
Monocytes Absolute: 1.3 10*3/uL — ABNORMAL HIGH (ref 0.1–1.0)
Monocytes Relative: 32 %
Neutro Abs: 2 10*3/uL (ref 1.7–7.7)
Neutrophils Relative %: 52 %
Platelet Count: 54 10*3/uL — ABNORMAL LOW (ref 150–400)
RBC: 3.65 MIL/uL — ABNORMAL LOW (ref 4.22–5.81)
RDW: 17.3 % — ABNORMAL HIGH (ref 11.5–15.5)
WBC Count: 4 10*3/uL (ref 4.0–10.5)
nRBC: 0 % (ref 0.0–0.2)

## 2023-10-11 MED ORDER — EPOETIN ALFA 20000 UNIT/ML IJ SOLN
20000.0000 [IU] | Freq: Once | INTRAMUSCULAR | Status: AC
Start: 2023-10-11 — End: 2023-10-11
  Administered 2023-10-11: 20000 [IU] via SUBCUTANEOUS
  Filled 2023-10-11: qty 1

## 2023-10-16 ENCOUNTER — Ambulatory Visit (HOSPITAL_COMMUNITY)
Admission: RE | Admit: 2023-10-16 | Discharge: 2023-10-16 | Disposition: A | Discharge status: Home / Self Care | Source: Ambulatory Visit | Attending: Cardiology | Admitting: Cardiology

## 2023-10-16 DIAGNOSIS — M5416 Radiculopathy, lumbar region: Secondary | ICD-10-CM | POA: Diagnosis not present

## 2023-10-16 DIAGNOSIS — I2510 Atherosclerotic heart disease of native coronary artery without angina pectoris: Secondary | ICD-10-CM

## 2023-10-16 DIAGNOSIS — R600 Localized edema: Secondary | ICD-10-CM | POA: Diagnosis not present

## 2023-10-16 DIAGNOSIS — I10 Essential (primary) hypertension: Secondary | ICD-10-CM | POA: Diagnosis not present

## 2023-10-16 DIAGNOSIS — M48062 Spinal stenosis, lumbar region with neurogenic claudication: Secondary | ICD-10-CM | POA: Diagnosis not present

## 2023-10-16 DIAGNOSIS — M48061 Spinal stenosis, lumbar region without neurogenic claudication: Secondary | ICD-10-CM | POA: Insufficient documentation

## 2023-10-16 LAB — ECHOCARDIOGRAM COMPLETE
AR max vel: 2.98 cm2
AV Area VTI: 3.32 cm2
AV Area mean vel: 3.21 cm2
AV Mean grad: 3 mmHg
AV Peak grad: 6.7 mmHg
Ao pk vel: 1.29 m/s
Area-P 1/2: 3.89 cm2
S' Lateral: 2.9 cm

## 2023-10-21 ENCOUNTER — Inpatient Hospital Stay (HOSPITAL_COMMUNITY)
Admission: EM | Admit: 2023-10-21 | Discharge: 2023-10-26 | DRG: 853 | Disposition: A | Discharge status: Home Health | Attending: Internal Medicine | Admitting: Internal Medicine

## 2023-10-21 ENCOUNTER — Emergency Department (HOSPITAL_COMMUNITY)

## 2023-10-21 DIAGNOSIS — M4657 Other infective spondylopathies, lumbosacral region: Secondary | ICD-10-CM | POA: Diagnosis not present

## 2023-10-21 DIAGNOSIS — M6281 Muscle weakness (generalized): Secondary | ICD-10-CM | POA: Diagnosis not present

## 2023-10-21 DIAGNOSIS — M21512 Acquired clawhand, left hand: Secondary | ICD-10-CM | POA: Diagnosis present

## 2023-10-21 DIAGNOSIS — R7881 Bacteremia: Secondary | ICD-10-CM

## 2023-10-21 DIAGNOSIS — M71122 Other infective bursitis, left elbow: Secondary | ICD-10-CM | POA: Diagnosis present

## 2023-10-21 DIAGNOSIS — I700 Atherosclerosis of aorta: Secondary | ICD-10-CM | POA: Diagnosis not present

## 2023-10-21 DIAGNOSIS — N400 Enlarged prostate without lower urinary tract symptoms: Secondary | ICD-10-CM | POA: Diagnosis not present

## 2023-10-21 DIAGNOSIS — I10 Essential (primary) hypertension: Secondary | ICD-10-CM | POA: Diagnosis not present

## 2023-10-21 DIAGNOSIS — E876 Hypokalemia: Secondary | ICD-10-CM

## 2023-10-21 DIAGNOSIS — D469 Myelodysplastic syndrome, unspecified: Secondary | ICD-10-CM | POA: Diagnosis not present

## 2023-10-21 DIAGNOSIS — M48061 Spinal stenosis, lumbar region without neurogenic claudication: Secondary | ICD-10-CM | POA: Diagnosis not present

## 2023-10-21 DIAGNOSIS — R262 Difficulty in walking, not elsewhere classified: Secondary | ICD-10-CM | POA: Diagnosis not present

## 2023-10-21 DIAGNOSIS — H3530 Unspecified macular degeneration: Secondary | ICD-10-CM | POA: Diagnosis present

## 2023-10-21 DIAGNOSIS — M869 Osteomyelitis, unspecified: Secondary | ICD-10-CM | POA: Diagnosis present

## 2023-10-21 DIAGNOSIS — H409 Unspecified glaucoma: Secondary | ICD-10-CM | POA: Diagnosis present

## 2023-10-21 DIAGNOSIS — M25522 Pain in left elbow: Secondary | ICD-10-CM | POA: Diagnosis not present

## 2023-10-21 DIAGNOSIS — Z9842 Cataract extraction status, left eye: Secondary | ICD-10-CM

## 2023-10-21 DIAGNOSIS — Z87891 Personal history of nicotine dependence: Secondary | ICD-10-CM | POA: Diagnosis not present

## 2023-10-21 DIAGNOSIS — S42402D Unspecified fracture of lower end of left humerus, subsequent encounter for fracture with routine healing: Secondary | ICD-10-CM | POA: Diagnosis not present

## 2023-10-21 DIAGNOSIS — K219 Gastro-esophageal reflux disease without esophagitis: Secondary | ICD-10-CM | POA: Diagnosis present

## 2023-10-21 DIAGNOSIS — T8469XA Infection and inflammatory reaction due to internal fixation device of other site, initial encounter: Secondary | ICD-10-CM | POA: Diagnosis not present

## 2023-10-21 DIAGNOSIS — M00822 Arthritis due to other bacteria, left elbow: Secondary | ICD-10-CM | POA: Diagnosis not present

## 2023-10-21 DIAGNOSIS — I340 Nonrheumatic mitral (valve) insufficiency: Secondary | ICD-10-CM | POA: Diagnosis not present

## 2023-10-21 DIAGNOSIS — R609 Edema, unspecified: Secondary | ICD-10-CM | POA: Diagnosis not present

## 2023-10-21 DIAGNOSIS — M009 Pyogenic arthritis, unspecified: Secondary | ICD-10-CM | POA: Diagnosis present

## 2023-10-21 DIAGNOSIS — R509 Fever, unspecified: Secondary | ICD-10-CM | POA: Diagnosis not present

## 2023-10-21 DIAGNOSIS — M79603 Pain in arm, unspecified: Secondary | ICD-10-CM | POA: Diagnosis not present

## 2023-10-21 DIAGNOSIS — B9561 Methicillin susceptible Staphylococcus aureus infection as the cause of diseases classified elsewhere: Secondary | ICD-10-CM | POA: Diagnosis not present

## 2023-10-21 DIAGNOSIS — Z9889 Other specified postprocedural states: Secondary | ICD-10-CM

## 2023-10-21 DIAGNOSIS — Z85820 Personal history of malignant melanoma of skin: Secondary | ICD-10-CM

## 2023-10-21 DIAGNOSIS — S53105A Unspecified dislocation of left ulnohumeral joint, initial encounter: Secondary | ICD-10-CM | POA: Diagnosis not present

## 2023-10-21 DIAGNOSIS — M60232 Foreign body granuloma of soft tissue, not elsewhere classified, left forearm: Secondary | ICD-10-CM | POA: Diagnosis not present

## 2023-10-21 DIAGNOSIS — Z9841 Cataract extraction status, right eye: Secondary | ICD-10-CM

## 2023-10-21 DIAGNOSIS — F32A Depression, unspecified: Secondary | ICD-10-CM | POA: Diagnosis present

## 2023-10-21 DIAGNOSIS — Z96651 Presence of right artificial knee joint: Secondary | ICD-10-CM | POA: Diagnosis present

## 2023-10-21 DIAGNOSIS — R2689 Other abnormalities of gait and mobility: Secondary | ICD-10-CM | POA: Diagnosis not present

## 2023-10-21 DIAGNOSIS — R531 Weakness: Secondary | ICD-10-CM | POA: Diagnosis not present

## 2023-10-21 DIAGNOSIS — M00022 Staphylococcal arthritis, left elbow: Principal | ICD-10-CM | POA: Diagnosis present

## 2023-10-21 DIAGNOSIS — M609 Myositis, unspecified: Secondary | ICD-10-CM | POA: Diagnosis present

## 2023-10-21 DIAGNOSIS — G062 Extradural and subdural abscess, unspecified: Secondary | ICD-10-CM | POA: Diagnosis not present

## 2023-10-21 DIAGNOSIS — D696 Thrombocytopenia, unspecified: Secondary | ICD-10-CM | POA: Diagnosis not present

## 2023-10-21 DIAGNOSIS — M86022 Acute hematogenous osteomyelitis, left humerus: Secondary | ICD-10-CM | POA: Diagnosis not present

## 2023-10-21 DIAGNOSIS — M47816 Spondylosis without myelopathy or radiculopathy, lumbar region: Secondary | ICD-10-CM | POA: Diagnosis not present

## 2023-10-21 DIAGNOSIS — M86132 Other acute osteomyelitis, left radius and ulna: Secondary | ICD-10-CM | POA: Diagnosis not present

## 2023-10-21 DIAGNOSIS — R0902 Hypoxemia: Secondary | ICD-10-CM | POA: Diagnosis not present

## 2023-10-21 DIAGNOSIS — Z8719 Personal history of other diseases of the digestive system: Secondary | ICD-10-CM

## 2023-10-21 DIAGNOSIS — T84611A Infection and inflammatory reaction due to internal fixation device of left humerus, initial encounter: Principal | ICD-10-CM | POA: Diagnosis present

## 2023-10-21 DIAGNOSIS — G8929 Other chronic pain: Secondary | ICD-10-CM | POA: Diagnosis not present

## 2023-10-21 DIAGNOSIS — I2510 Atherosclerotic heart disease of native coronary artery without angina pectoris: Secondary | ICD-10-CM | POA: Diagnosis present

## 2023-10-21 DIAGNOSIS — M5136 Other intervertebral disc degeneration, lumbar region with discogenic back pain only: Secondary | ICD-10-CM | POA: Diagnosis not present

## 2023-10-21 DIAGNOSIS — M24522 Contracture, left elbow: Secondary | ICD-10-CM | POA: Diagnosis not present

## 2023-10-21 DIAGNOSIS — Z85828 Personal history of other malignant neoplasm of skin: Secondary | ICD-10-CM

## 2023-10-21 DIAGNOSIS — N401 Enlarged prostate with lower urinary tract symptoms: Secondary | ICD-10-CM | POA: Diagnosis not present

## 2023-10-21 DIAGNOSIS — Z8673 Personal history of transient ischemic attack (TIA), and cerebral infarction without residual deficits: Secondary | ICD-10-CM

## 2023-10-21 DIAGNOSIS — Z881 Allergy status to other antibiotic agents status: Secondary | ICD-10-CM

## 2023-10-21 DIAGNOSIS — Z79891 Long term (current) use of opiate analgesic: Secondary | ICD-10-CM

## 2023-10-21 DIAGNOSIS — D62 Acute posthemorrhagic anemia: Secondary | ICD-10-CM | POA: Diagnosis not present

## 2023-10-21 DIAGNOSIS — B9689 Other specified bacterial agents as the cause of diseases classified elsewhere: Secondary | ICD-10-CM

## 2023-10-21 DIAGNOSIS — Z801 Family history of malignant neoplasm of trachea, bronchus and lung: Secondary | ICD-10-CM

## 2023-10-21 DIAGNOSIS — Z472 Encounter for removal of internal fixation device: Secondary | ICD-10-CM | POA: Diagnosis not present

## 2023-10-21 DIAGNOSIS — G2581 Restless legs syndrome: Secondary | ICD-10-CM | POA: Diagnosis present

## 2023-10-21 DIAGNOSIS — M4807 Spinal stenosis, lumbosacral region: Secondary | ICD-10-CM | POA: Diagnosis not present

## 2023-10-21 DIAGNOSIS — M19022 Primary osteoarthritis, left elbow: Secondary | ICD-10-CM | POA: Diagnosis present

## 2023-10-21 DIAGNOSIS — G061 Intraspinal abscess and granuloma: Secondary | ICD-10-CM

## 2023-10-21 DIAGNOSIS — Z9089 Acquired absence of other organs: Secondary | ICD-10-CM

## 2023-10-21 DIAGNOSIS — I959 Hypotension, unspecified: Secondary | ICD-10-CM | POA: Diagnosis not present

## 2023-10-21 DIAGNOSIS — A4101 Sepsis due to Methicillin susceptible Staphylococcus aureus: Secondary | ICD-10-CM | POA: Diagnosis not present

## 2023-10-21 DIAGNOSIS — F1021 Alcohol dependence, in remission: Secondary | ICD-10-CM | POA: Diagnosis present

## 2023-10-21 DIAGNOSIS — M545 Low back pain, unspecified: Secondary | ICD-10-CM | POA: Diagnosis present

## 2023-10-21 DIAGNOSIS — Z79899 Other long term (current) drug therapy: Secondary | ICD-10-CM

## 2023-10-21 DIAGNOSIS — Z803 Family history of malignant neoplasm of breast: Secondary | ICD-10-CM

## 2023-10-21 LAB — SYNOVIAL CELL COUNT + DIFF, W/ CRYSTALS
Lymphocytes-Synovial Fld: 16 % (ref 0–20)
Monocyte-Macrophage-Synovial Fluid: 26 % — ABNORMAL LOW (ref 50–90)
Neutrophil, Synovial: 58 % — ABNORMAL HIGH (ref 0–25)
WBC, Synovial: UNDETERMINED /mm3 (ref 0–200)

## 2023-10-21 LAB — COMPREHENSIVE METABOLIC PANEL WITH GFR
ALT: 11 U/L (ref 0–44)
AST: 16 U/L (ref 15–41)
Albumin: 3 g/dL — ABNORMAL LOW (ref 3.5–5.0)
Alkaline Phosphatase: 60 U/L (ref 38–126)
Anion gap: 8 (ref 5–15)
BUN: 24 mg/dL — ABNORMAL HIGH (ref 8–23)
CO2: 23 mmol/L (ref 22–32)
Calcium: 8.6 mg/dL — ABNORMAL LOW (ref 8.9–10.3)
Chloride: 106 mmol/L (ref 98–111)
Creatinine, Ser: 1.01 mg/dL (ref 0.61–1.24)
GFR, Estimated: >60 mL/min (ref >60)
Glucose, Bld: 85 mg/dL (ref 70–99)
Potassium: 3.1 mmol/L — ABNORMAL LOW (ref 3.5–5.1)
Sodium: 137 mmol/L (ref 135–145)
Total Bilirubin: 1 mg/dL (ref 0.0–1.2)
Total Protein: 6 g/dL — ABNORMAL LOW (ref 6.5–8.1)

## 2023-10-21 LAB — GRAM STAIN

## 2023-10-21 LAB — CBC WITH DIFFERENTIAL/PLATELET
Abs Immature Granulocytes: 0.1 10*3/uL — ABNORMAL HIGH (ref 0.00–0.07)
Basophils Absolute: 0 10*3/uL (ref 0.0–0.1)
Basophils Relative: 0 %
Eosinophils Absolute: 0 10*3/uL (ref 0.0–0.5)
Eosinophils Relative: 0 %
HCT: 29.9 % — ABNORMAL LOW (ref 39.0–52.0)
Hemoglobin: 9 g/dL — ABNORMAL LOW (ref 13.0–17.0)
Immature Granulocytes: 1 %
Lymphocytes Relative: 3 %
Lymphs Abs: 0.4 10*3/uL — ABNORMAL LOW (ref 0.7–4.0)
MCH: 23.8 pg — ABNORMAL LOW (ref 26.0–34.0)
MCHC: 30.1 g/dL (ref 30.0–36.0)
MCV: 79.1 fL — ABNORMAL LOW (ref 80.0–100.0)
Monocytes Absolute: 5.9 10*3/uL — ABNORMAL HIGH (ref 0.1–1.0)
Monocytes Relative: 46 %
Neutro Abs: 6.5 10*3/uL (ref 1.7–7.7)
Neutrophils Relative %: 50 %
Platelets: 104 10*3/uL — ABNORMAL LOW (ref 150–400)
RBC: 3.78 MIL/uL — ABNORMAL LOW (ref 4.22–5.81)
RDW: 17.7 % — ABNORMAL HIGH (ref 11.5–15.5)
WBC: 13 10*3/uL — ABNORMAL HIGH (ref 4.0–10.5)
nRBC: 0 % (ref 0.0–0.2)

## 2023-10-21 LAB — CBG MONITORING, ED
Glucose-Capillary: 92 mg/dL (ref 70–99)
Glucose-Capillary: 78 mg/dL (ref 70–99)

## 2023-10-21 LAB — C-REACTIVE PROTEIN: CRP: 9.3 mg/dL — ABNORMAL HIGH (ref <1.0)

## 2023-10-21 LAB — URIC ACID: Uric Acid, Serum: 4.6 mg/dL (ref 3.7–8.6)

## 2023-10-21 LAB — MAGNESIUM: Magnesium: 1.7 mg/dL (ref 1.7–2.4)

## 2023-10-21 LAB — SEDIMENTATION RATE: Sed Rate: 12 mm/h (ref 0–16)

## 2023-10-21 LAB — I-STAT CG4 LACTIC ACID, ED: Lactic Acid, Venous: 0.7 mmol/L (ref 0.5–1.9)

## 2023-10-21 LAB — PROCALCITONIN: Procalcitonin: 1.21 ng/mL

## 2023-10-21 MED ORDER — SODIUM CHLORIDE 0.9 % IV SOLN
2.0000 g | INTRAVENOUS | Status: DC
  Administered 2023-10-21: 2 g via INTRAVENOUS
  Filled 2023-10-21: qty 20

## 2023-10-21 MED ORDER — FENTANYL CITRATE PF 50 MCG/ML IJ SOSY
50.0000 ug | PREFILLED_SYRINGE | Freq: Once | INTRAMUSCULAR | Status: AC
  Administered 2023-10-21: 50 ug via INTRAVENOUS
  Filled 2023-10-21: qty 1

## 2023-10-21 MED ORDER — VANCOMYCIN HCL 1250 MG/250ML IV SOLN: 1250.0000 mg | INTRAVENOUS | Status: DC

## 2023-10-21 MED ORDER — AMLODIPINE BESYLATE 5 MG PO TABS
5.0000 mg | ORAL_TABLET | Freq: Every day | ORAL | Status: DC
  Administered 2023-10-21 – 2023-10-25 (×4): 5 mg via ORAL
  Filled 2023-10-21 (×5): qty 1

## 2023-10-21 MED ORDER — TAMSULOSIN HCL 0.4 MG PO CAPS
0.4000 mg | ORAL_CAPSULE | Freq: Every morning | ORAL | Status: DC
  Administered 2023-10-22 – 2023-10-26 (×5): 0.4 mg via ORAL
  Filled 2023-10-21 (×5): qty 1

## 2023-10-21 MED ORDER — PROSIGHT PO TABS
1.0000 | ORAL_TABLET | Freq: Two times a day (BID) | ORAL | Status: DC
  Administered 2023-10-21 – 2023-10-26 (×9): 1 via ORAL
  Filled 2023-10-21 (×12): qty 1

## 2023-10-21 MED ORDER — POTASSIUM CHLORIDE CRYS ER 20 MEQ PO TBCR
40.0000 meq | EXTENDED_RELEASE_TABLET | Freq: Once | ORAL | Status: AC
  Administered 2023-10-21: 40 meq via ORAL
  Filled 2023-10-21: qty 2

## 2023-10-21 MED ORDER — POLYVINYL ALCOHOL 1.4 % OP SOLN: 2.0000 [drp] | OPHTHALMIC | Status: DC | PRN

## 2023-10-21 MED ORDER — VITAMIN D 25 MCG (1000 UNIT) PO TABS
3000.0000 [IU] | ORAL_TABLET | Freq: Every day | ORAL | Status: DC
  Administered 2023-10-21 – 2023-10-26 (×5): 3000 [IU] via ORAL
  Filled 2023-10-21 (×5): qty 3

## 2023-10-21 MED ORDER — ONDANSETRON HCL 4 MG/2ML IJ SOLN
4.0000 mg | Freq: Four times a day (QID) | INTRAMUSCULAR | Status: DC | PRN
Start: 2023-10-21 — End: 2023-10-27

## 2023-10-21 MED ORDER — PRAVASTATIN SODIUM 40 MG PO TABS
20.0000 mg | ORAL_TABLET | Freq: Every day | ORAL | Status: DC
  Administered 2023-10-21 – 2023-10-26 (×5): 20 mg via ORAL
  Filled 2023-10-21 (×5): qty 1

## 2023-10-21 MED ORDER — ONDANSETRON HCL 4 MG PO TABS: 4.0000 mg | ORAL_TABLET | Freq: Four times a day (QID) | ORAL | Status: DC | PRN

## 2023-10-21 MED ORDER — OXYCODONE HCL 5 MG PO TABS
10.0000 mg | ORAL_TABLET | Freq: Once | ORAL | Status: AC
  Administered 2023-10-21: 10 mg via ORAL
  Filled 2023-10-21: qty 2

## 2023-10-21 MED ORDER — ACETAMINOPHEN 650 MG RE SUPP: 650.0000 mg | Freq: Four times a day (QID) | RECTAL | Status: DC | PRN

## 2023-10-21 MED ORDER — CARBIDOPA-LEVODOPA 25-100 MG PO TABS
1.0000 | ORAL_TABLET | Freq: Every day | ORAL | Status: DC
  Administered 2023-10-21 – 2023-10-25 (×5): 1 via ORAL
  Filled 2023-10-21 (×5): qty 1

## 2023-10-21 MED ORDER — PREDNISONE 10 MG (21) PO TBPK: ORAL_TABLET | Freq: Every day | ORAL | Status: DC

## 2023-10-21 MED ORDER — CALCIUM CARBONATE ANTACID 500 MG PO CHEW: 750.0000 mg | CHEWABLE_TABLET | Freq: Every day | ORAL | Status: DC | PRN

## 2023-10-21 MED ORDER — VITAMIN B-12 1000 MCG PO TABS
2000.0000 ug | ORAL_TABLET | Freq: Every day | ORAL | Status: DC
Start: 2023-10-21 — End: 2023-10-27
  Administered 2023-10-21 – 2023-10-26 (×5): 2000 ug via ORAL
  Filled 2023-10-21 (×5): qty 2

## 2023-10-21 MED ORDER — FINASTERIDE 5 MG PO TABS
5.0000 mg | ORAL_TABLET | Freq: Every day | ORAL | Status: DC
Start: 2023-10-21 — End: 2023-10-27
  Administered 2023-10-21 – 2023-10-26 (×6): 5 mg via ORAL
  Filled 2023-10-21 (×6): qty 1

## 2023-10-21 MED ORDER — OXYCODONE HCL 5 MG PO TABS
10.0000 mg | ORAL_TABLET | Freq: Every day | ORAL | Status: DC
  Administered 2023-10-21 – 2023-10-26 (×23): 10 mg via ORAL
  Filled 2023-10-21 (×23): qty 2

## 2023-10-21 MED ORDER — POLYETHYL GLYCOL-PROPYL GLYCOL 0.4-0.3 % OP GEL: Freq: Two times a day (BID) | OPHTHALMIC | Status: DC | PRN

## 2023-10-21 MED ORDER — HYDROMORPHONE HCL 1 MG/ML IJ SOLN
0.5000 mg | Freq: Once | INTRAMUSCULAR | Status: AC
  Administered 2023-10-21: 0.5 mg via INTRAVENOUS
  Filled 2023-10-21: qty 1

## 2023-10-21 MED ORDER — OXYCODONE HCL 5 MG PO TABS: 10.0000 mg | ORAL_TABLET | Freq: Every day | ORAL | Status: DC

## 2023-10-21 MED ORDER — GABAPENTIN 300 MG PO CAPS
300.0000 mg | ORAL_CAPSULE | Freq: Three times a day (TID) | ORAL | Status: DC
  Administered 2023-10-21 – 2023-10-26 (×15): 300 mg via ORAL
  Filled 2023-10-21 (×15): qty 1

## 2023-10-21 MED ORDER — VANCOMYCIN HCL 1500 MG/300ML IV SOLN
1500.0000 mg | Freq: Once | INTRAVENOUS | Status: AC
  Administered 2023-10-21: 1500 mg via INTRAVENOUS
  Filled 2023-10-21: qty 300

## 2023-10-21 MED ORDER — PREDNISONE 10 MG PO TABS
10.0000 mg | ORAL_TABLET | Freq: Every day | ORAL | Status: AC
  Administered 2023-10-21: 10 mg via ORAL
  Filled 2023-10-21 (×2): qty 1

## 2023-10-21 MED ORDER — MORPHINE SULFATE (PF) 2 MG/ML IV SOLN
2.0000 mg | INTRAVENOUS | Status: DC | PRN
  Administered 2023-10-21 – 2023-10-22 (×4): 2 mg via INTRAVENOUS
  Filled 2023-10-21 (×4): qty 1

## 2023-10-21 MED ORDER — MAGNESIUM HYDROXIDE 400 MG/5ML PO SUSP: 15.0000 mL | Freq: Every day | ORAL | Status: DC | PRN

## 2023-10-21 MED ORDER — ACETAMINOPHEN 325 MG PO TABS: 650.0000 mg | ORAL_TABLET | Freq: Four times a day (QID) | ORAL | Status: DC | PRN

## 2023-10-21 MED ORDER — CARBIDOPA-LEVODOPA 25-100 MG PO TABS: 1.0000 | ORAL_TABLET | Freq: Every day | ORAL | Status: DC | PRN

## 2023-10-21 MED ORDER — SODIUM CHLORIDE 0.9 % IV SOLN
2.0000 g | Freq: Once | INTRAVENOUS | Status: AC
  Administered 2023-10-21: 2 g via INTRAVENOUS
  Filled 2023-10-21: qty 12.5

## 2023-10-21 MED ORDER — LIDOCAINE HCL (PF) 1 % IJ SOLN
30.0000 mL | Freq: Once | INTRAMUSCULAR | Status: AC
  Administered 2023-10-21: 30 mL
  Filled 2023-10-21: qty 30

## 2023-10-21 MED ORDER — VITAMIN B-12 1000 MCG SL SUBL: 2000.0000 ug | SUBLINGUAL_TABLET | Freq: Every day | SUBLINGUAL | Status: DC

## 2023-10-21 MED ORDER — OXYCODONE HCL 10 MG PO TABS: 10.0000 mg | ORAL_TABLET | Freq: Every day | ORAL | Status: DC

## 2023-10-21 MED ORDER — TERAZOSIN HCL 1 MG PO CAPS
2.0000 mg | ORAL_CAPSULE | Freq: Every day | ORAL | Status: DC
  Administered 2023-10-21 – 2023-10-25 (×5): 2 mg via ORAL
  Filled 2023-10-21 (×6): qty 2

## 2023-10-21 MED ORDER — ROPINIROLE HCL 1 MG PO TABS
1.0000 mg | ORAL_TABLET | Freq: Four times a day (QID) | ORAL | Status: DC
  Administered 2023-10-21 – 2023-10-26 (×19): 1 mg via ORAL
  Filled 2023-10-21 (×20): qty 1

## 2023-10-21 MED ORDER — PREDNISONE 10 MG PO TABS
5.0000 mg | ORAL_TABLET | Freq: Every day | ORAL | Status: AC
  Administered 2023-10-23: 5 mg via ORAL
  Filled 2023-10-21: qty 1

## 2023-10-21 MED ORDER — CARBIDOPA-LEVODOPA 25-100 MG PO TABS: 1.0000 | ORAL_TABLET | Freq: Every day | ORAL | Status: DC

## 2023-10-21 NOTE — ED Triage Notes (Signed)
 Per EMS from home. Report pain in left elbow radiating down left arm. No falls, no trauma. Hx of back pain. Hx of platelet disorder.  BP 128/62 RR 18 HR 75 CBG 140

## 2023-10-21 NOTE — ED Notes (Signed)
 Called [facility] for transport to [facility]

## 2023-10-21 NOTE — Progress Notes (Signed)
 4 eye assessment completed with Lachelle, RN:  - No  skin breakdown on back, bottom, or bilat heels   - No mepilex present on admission   - Braden scale <20  - IV dressing was not labeled or dated   Pt oriented to room and call light

## 2023-10-21 NOTE — H&P (Signed)
 History and Physical    Patient: Justin [name] [facility]. ION:[id number] DOB: December 26, 1940 DOA: 10/21/2023 DOS: the patient was seen and examined on 10/21/2023 PCP: Plotnikov, Oakley Bellman, MD  Patient coming from: Home - lives with his wife and a son. 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 Uses a cane to ambulate.    Chief Complaint: left elbow pain   HPI: Justin [name]. is a 83 y.o. male with medical history significant of HTN, CAD, BPH, hx of TIAs, DDD/chronic low back pain, MDS, thrombocytopenia, depression who presented to ED with complaints of acute on chronic left elbow pain. He started to have pain out of the ordinary last night around 8pm. He denies any trauma. This morning he called his daughter and she went over to his house. He was to weak and in too much pain to even get into the car so she called EMS. He can not move his left elbow at all. Pain 10/10. Pain radiates down his entire hand. He denies any fever, chilled at times. NO N/V. It is warm to touch, not really sure it's changed in color.   He has history of an open fracture in his left elbow in 2007 and had surgery. About 4 years ago one of the pins came lose and this was taken out. At baseline now he can not fully extend or flex to 90 degrees but has some movement in it. He can't used his hands at all. He states his hands are like a claw. He is right handed.    He has been feeling good. Denies any fever/chills, vision changes/headaches, chest pain or palpitations, shortness of breath or cough, abdominal pain, N/V/D, dysuria or leg swelling.    Denies any fever, vision changes/headaches, chest pain or palpitations, shortness of breath or cough, abdominal pain, N/V/D, dysuria or leg swelling.   He does not smoke or drink alcohol .   ER Course:  vitals: afebrile, bp: 133/69, HR; 78, RR: 16, oxygen: 94% Pertinent labs: WBC: 13, hgb: 9.0, platelets 104, potassium: 3.1,  Left elbow xray: Remote distal humeral and proximal  olecranon plate and screw fracture fixation. No evidence of hardware failure. 2. 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 3. There is a linear 22 x less than 1 mm metallic possible needle within the soft tissues medial to the proximal diaphysis of the ulna, appearing within approximately 1.2 cm of the medial proximal forearm skin surface. Recommend clinical correlation. In ED: given potassium, cefepime  and vanc and BC obtained. Lactic acid wnl. Fluid obtained from elbow. TRH asked to admit.   Review of Systems: As mentioned in the history of present illness. All other systems reviewed and are negative. Past Medical History:  Diagnosis Date   Alcoholism (HCC)    Dry 13 years   Basal cell carcinoma    Right Chest   BPH (benign prostatic hyperplasia)    Diverticulosis of colon    Dizzy spells    none recent   DJD (degenerative joint disease)    lower back   Elbow fracture, left 2009   Dr Guyann Leitz   GERD (gastroesophageal reflux disease)    Glaucoma    History of TIAs 1 yrs ago   Hyperkeratosis    LBP (low back pain)    Macular degeneration    left wet right dry sees dr Seward Dao   Melanoma [facility]) 2009   x3 Dr Amy [place]   Osteoarthritis    Restless leg syndrome    Thrombocytopenia (HCC) 11/18/2021  Tinnitus    Tobacco abuse    Uses walker    Wears glasses    reading   Wears partial dentures    upper   Past Surgical History:  Procedure Laterality Date   CATARACT EXTRACTION Bilateral 2018   HARDWARE REMOVAL Left 07/09/2020   Procedure: Left elbow hardware removal;  Surgeon: Janeth Medicus, MD;  Location: [facility];  Service: Orthopedics;  Laterality: Left;  60 mins   mda     Dr. Seward Dao wet injection   MELANOMA EXCISION  2009   x3   RECONSTRUCTION MEDIAL COLLATERAL LIGAMENT ELBOW W/ TENDON GRAFT  2009   Left, Dr Guyann Leitz   TONSILLECTOMY  as child   TOTAL KNEE ARTHROPLASTY Right 11/09/2020   Procedure: TOTAL KNEE ARTHROPLASTY;   Surgeon: Claiborne Crew, MD;  Location: [facility];  Service: Orthopedics;  Laterality: Right;   Social History:  reports that he quit smoking about 39 years ago. His smoking use included cigarettes. He started smoking about 77 years ago. He has a 114 pack-year smoking history. He has never used smokeless tobacco. He reports that he does not currently use drugs. He reports that he does not drink alcohol .  Allergies  Allergen Reactions   Levofloxacin Other (See Comments)    REACTION: hands pealed    Family History  Problem Relation Age of Onset   Cancer Mother        ?breast   Cancer Father        Lung    Prior to Admission medications   Medication Sig Start Date End Date Taking? Authorizing Provider  acetaminophen  (TYLENOL ) 500 MG tablet Take 1,000 mg by mouth every 6 (six) hours as needed for mild pain (pain score 1-3) or moderate pain (pain score 4-6).   Yes [provider]  amLODipine  (NORVASC ) 5 MG tablet TAKE 1 TABLET BY MOUTH DAILY Patient taking differently: Take 5 mg by mouth at bedtime. 03/21/23  Yes Plotnikov, Oakley Bellman, MD  calcium  carbonate (TUMS EX) 750 MG chewable tablet Chew 1,500 mg by mouth daily as needed for heartburn.   Yes [provider]  carbidopa -levodopa  (SINEMET  IR) 25-100 MG tablet For breakthrough restless leg, you can take 1 tablet at bedtime.  OK to take extra tab as needed during the day. Patient taking differently: Take 1 tablet by mouth at bedtime. 03/22/20  Yes Patel, Donika K, DO  Cholecalciferol  1000 UNITS tablet Take 3,000 Units by mouth daily.   Yes [provider]  Cyanocobalamin  (VITAMIN B-12) 1000 MCG SUBL DISSOLVE 2 TABLETS IN MOUTH EVERY DAY 05/10/21  Yes Plotnikov, Aleksei V, MD  finasteride  (PROSCAR ) 5 MG tablet TAKE 1 TABLET (5 MG TOTAL) BY MOUTH DAILY. 08/24/23  Yes Plotnikov, Oakley Bellman, MD  furosemide  (LASIX ) 20 MG tablet LASIX  20 MG AS NEEDED FOR 3 POUND WEIGHT GAIN OVER NIGHT AND 5 POUND WEIGHT GAIN IN A WEEK. 09/11/23   Yes Fountain, Madison L, NP  gabapentin  (NEURONTIN ) 300 MG capsule Take 1 capsule (300 mg total) by mouth 4 (four) times daily. Follow-up appt due in Sept must see provider for future refills Patient taking differently: Take 300 mg by mouth 3 (three) times daily. 05/07/23  Yes Plotnikov, Aleksei V, MD  ibuprofen (ADVIL) 200 MG tablet Take 400 mg by mouth every 6 (six) hours as needed for mild pain (pain score 1-3) or moderate pain (pain score 4-6).   Yes [provider]  magnesium  hydroxide (MILK OF MAGNESIA) 400 MG/5ML suspension Take 15 mLs  by mouth daily as needed for mild constipation.   Yes [provider]  meclizine  (ANTIVERT ) 12.5 MG tablet TAKE 1 TABLET BY MOUTH 3 TIMES DAILY AS NEEDED FOR DIZZINESS. 10/04/22  Yes Plotnikov, Aleksei V, MD  Multiple Vitamins-Minerals (PRESERVISION AREDS PO) Take 2 tablets by mouth 2 (two) times daily.   Yes [provider]  Oxycodone  HCl 10 MG TABS Take 10 mg by mouth 5 (five) times daily.   Yes [provider]  Polyethyl Glycol-Propyl Glycol (SYSTANE OP) Place 1 drop into both eyes 2 (two) times daily as needed (dry eyes). [facility]   Yes [provider]  pravastatin  (PRAVACHOL ) 20 MG tablet TAKE 1 TABLET (20 MG TOTAL) BY MOUTH DAILY. 08/24/23  Yes Plotnikov, Oakley Bellman, MD  predniSONE  (STERAPRED UNI-PAK 21 TAB) 10 MG (21) TBPK tablet Take per the package instructions. 10/16/23  Yes [provider]  rOPINIRole  (REQUIP ) 2 MG tablet TAKE 1 TABLET BY MOUTH AT AT NOON AND 1 TAB AT 4PM AND 1 TAB AT BEDTIME Patient taking differently: Take 1 mg by mouth in the morning, at noon, in the evening, and at bedtime. 10/11/22  Yes Plotnikov, Oakley Bellman, MD  tamsulosin  (FLOMAX ) 0.4 MG CAPS capsule Take 1 capsule (0.4 mg total) by mouth in the morning. 11/21/22  Yes Plotnikov, Oakley Bellman, MD  terazosin  (HYTRIN ) 2 MG capsule TAKE 1 CAPSULE BY MOUTH EVERY NIGHT AT BEDTIME 09/21/23  Yes Plotnikov, Aleksei V, MD  doxycycline  (VIBRAMYCIN )  100 MG capsule Take 100 mg by mouth 2 (two) times daily. Patient not taking: Reported on 10/21/2023 10/03/23   [provider]    Physical Exam: Vitals:   10/21/23 1348 10/21/23 1519 10/21/23 1752 10/21/23 1906  BP: (!) 147/75 (!) 153/77 136/70   Pulse: 94 96 (!) 109   Resp: 18 18    Temp:  97.6 F (36.4 C) (!) 97.5 F (36.4 C)   TempSrc:  Oral Oral   SpO2: 98% 95% 98%   Weight:    76.3 kg  Height:    5\' 9"  (1.753 m)   General:  Appears calm and comfortable and is in NAD. Scabbed over lesion on nose  Eyes:  PERRL, EOMI, normal lids, iris ENT:  grossly normal hearing, lips & tongue, mmm; appropriate dentition Neck:  no LAD, masses or thyromegaly; no carotid bruits Cardiovascular:  RRR, no m/r/g. No LE edema.  Respiratory:   CTA bilaterally with no wheezes/rales/rhonchi.  Normal respiratory effort. Abdomen:  soft, NT, ND, NABS Back:   normal alignment, no CVAT Skin:  no rash or induration seen on limited exam Musculoskeletal:  left elbow: warm, mild edema. No frank erythema. Significantly TTP over entire elbow and superiorly/inferiorly. Radial pulse intact. Minimal movement.  Lower extremity:  No LE edema.  Limited foot exam with no ulcerations.  2+ distal pulses. Psychiatric:  grossly normal mood and affect, speech fluent and appropriate, AOx3 Neurologic:  CN 2-12 grossly intact, moves all extremities in coordinated fashion, sensation intact   Radiological Exams on Admission: Independently reviewed - see discussion in A/P where applicable  DG Elbow Complete Left Result Date: 10/21/2023 CLINICAL DATA:  Swelling. Hot, red, mobile. Left elbow pain radiating down left arm. Left elbow surgery years ago. EXAM: LEFT ELBOW - COMPLETE 3+ VIEW COMPARISON:  Left elbow radiographs 12/20/2007 FINDINGS: Remote distal lateral and distal medial humeral plate and screw fracture fixation, and proximal posterior olecranon plate and screw fracture fixation, also seen on 12/20/2007 prior  radiographs. Bone overlap limits evaluation,  however there appears to be severe capitellum-radial head and trochlea-coronoid joint space narrowing. Subchondral sclerosis is seen within the proximal coronoid. There may be partial mid medial elbow arthrodesis. No acute fracture or dislocation.  No evidence of hardware failure. No definite elbow joint effusion. There is a linear 22 x less than 1 mm metallic possible needle within the soft tissues medial to the proximal diaphysis of the ulna, appearing within approximately 1.2 cm of the medial proximal forearm skin surface. IMPRESSION: 1. Remote distal humeral and proximal olecranon plate and screw fracture fixation. No evidence of hardware failure. 2. 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 3. There is a linear 22 x less than 1 mm metallic possible needle within the soft tissues medial to the proximal diaphysis of the ulna, appearing within approximately 1.2 cm of the medial proximal forearm skin surface. Recommend clinical correlation. Electronically Signed   By: Bertina Broccoli M.D.   On: 10/21/2023 12:15    EKG: Independently reviewed.  NSR with rate 80; nonspecific ST changes with no evidence of acute ischemia. PAC new   Labs on Admission: I have personally reviewed the available labs and imaging studies at the time of the admission.  Pertinent labs:   WBC: 13,  hgb: 9.0,  platelets 104, potassium: 3.1  Assessment and Plan: Principal Problem:   Acute on chronic pain of left elbow concerning for septic arthritis Active Problems:   Hypokalemia   MDS (myelodysplastic syndrome) (HCC)   RLS (restless legs syndrome)   Low back pain   Coronary artery disease   HTN (hypertension)   BPH (benign prostatic hyperplasia)   GERD (gastroesophageal reflux disease)    Assessment and Plan: * Acute on chronic pain of left elbow concerning for septic arthritis 83 year old presenting to ED with acute onset of pain in his left  elbow, decreased mobility, warm to touch and labs concerning for possibly infected joint. He has history of an open fracture in his left elbow in 2007 and had surgery with hardware. About 4 years ago one of the pins came lose and this was taken out.  -admit to [facility] -ortho consulted (Dr. Bernard Brick)  -joint aspiration obtained by EDP and described as purulent. Gram stain showing gram positive cocci -continue vancomycin . Change cefepime  to rocephin   -check inflammatory markers -check uric acid  -no hx of gout or pseudo gout, but follow synovial fluid for crystals  -trend PCT  -CT/other imaging per ortho  -pain control difficult. Continue home oxycodone  five times/day. Morphine  q 2 hours for severe pain, may need dilaudid , but transferring to [facility].   Hypokalemia Check magnesium  Repleted in ED Trend   MDS (myelodysplastic syndrome) (HCC) -followed by Dr. Rosaline Coma. Diagnosed 12/2021. Myelodysplastic Syndrome, Single Lineage Dysplasia -12/19/2021: Bone marrow biopsy which showed a low-grade myelodysplastic syndrome.  - Started retacrit  20,000 units q 2 weeks  RLS (restless legs syndrome) Continue requip , gabapentin  and sinemet    Low back pain Continue home oxycodone  10mg  5x/day.  Continue steroid taper (2 more days)  PMP website verified.   Coronary artery disease Lexiscan  stress test 2/19 was overall low risk with no ischemia identified.   CT cardiac scoring 07/2018 showed coronary calcium  score of 41 (15th percentile)  Continue statin/ASA   HTN (hypertension) Continue norvasc , hytrin   BPH (benign prostatic hyperplasia) Continue flomax  and proscar    GERD (gastroesophageal reflux disease) On TUMs PRN    Advance Care Planning:   Code Status: Full Code   Consults: ortho: Dr. Bernard Brick   DVT Prophylaxis:  SCDs  Family Communication: daughter at bedside   Severity of Illness: The appropriate patient status for this patient is INPATIENT. Inpatient status is judged to be reasonable and necessary  in order to provide the required intensity of service to ensure the patient's safety. The patient's presenting symptoms, physical exam findings, and initial radiographic and laboratory data in the context of their chronic comorbidities is felt to place them at high risk for further clinical deterioration. Furthermore, it is not anticipated that the patient will be medically stable for discharge from the hospital within 2 midnights of admission.   * I certify that at the point of admission it is my clinical judgment that the patient will require inpatient hospital care spanning beyond 2 midnights from the point of admission due to high intensity of service, high risk for further deterioration and high frequency of surveillance required.*  Author: Raymona Caldwell, MD 10/21/2023 7:09 PM  For on call review [email or web address].

## 2023-10-21 NOTE — Plan of Care (Signed)

## 2023-10-21 NOTE — Assessment & Plan Note (Addendum)
 10-21-2023 through 10-24-2023 Continue home oxycodone  10mg  5x/day. Continue steroid taper (2 more days). PMP website verified.   10-24-2023 stable. On oxycodone .  10-25-2023 due to epidural abscess. Continue oxycodone .  10-26-2023 stable.

## 2023-10-21 NOTE — Assessment & Plan Note (Addendum)
 10-24-2023 Continue requip , gabapentin  and sinemet    10-25-2023 stable.  10-26-2023 stable.

## 2023-10-21 NOTE — ED Provider Notes (Signed)
 [place] EMERGENCY DEPARTMENT AT [facility] Provider Note   CSN: [id number] Arrival date & time: 10/21/23  4098     History  No chief complaint on file.   Justin [name]. is a 83 y.o. male with a past medical history of thrombocytopenia, basal and basal cell carcinoma and melanoma, chronic lower back pain on pain management with Dr. Coley Davis at [facility], restless leg syndrome, BPH, presents emergency department with chief complaint of severe left elbow pain.  He has a history of some chronic pain in this elbow status post ORIF of a fracture.  He reports that several years ago one of the screws began migrating out and had to be removed.  2 days ago he began having severe pain and swelling heat and redness which are all brand-new he has no history of gout.  He is unable to move the elbow without severe pain.  His daughter states that she was going to try to take him to the doctor yesterday but he was too weak to stand up or walk.  He is currently on oxycodone  milligrams 5 times a day for his chronic back pain from severe degenerative changes in the stenosis.  Has not been helpful with his left arm pain.  He denies fever or recent injections to his back.  Currently on steroids for his back as well.  HPI     Home Medications Prior to Admission medications   Medication Sig Start Date End Date Taking? Authorizing Provider  Acetaminophen  (TYLENOL  PO) Tylenol     [provider]  amLODipine  (NORVASC ) 5 MG tablet TAKE 1 TABLET BY MOUTH DAILY 03/21/23   Plotnikov, Aleksei V, MD  calcium  carbonate (TUMS EX) 750 MG chewable tablet Chew 1 tablet by mouth daily.    [provider]  carbidopa -levodopa  (SINEMET  IR) 25-100 MG tablet For breakthrough restless leg, you can take 1 tablet at bedtime.  OK to take extra tab as needed during the day. Patient taking differently: Take 1 tablet by mouth at bedtime. 03/22/20   Patel, Donika K, DO  Cholecalciferol  1000 UNITS tablet  Take 3,000 Units by mouth daily.    [provider]  Cyanocobalamin  (VITAMIN B-12) 1000 MCG SUBL DISSOLVE 2 TABLETS IN MOUTH EVERY DAY 05/10/21   Plotnikov, Aleksei V, MD  ferrous sulfate  325 (65 FE) MG tablet Take 650 mg by mouth daily with breakfast.    [provider]  finasteride  (PROSCAR ) 5 MG tablet TAKE 1 TABLET (5 MG TOTAL) BY MOUTH DAILY. 08/24/23   Plotnikov, Aleksei V, MD  furosemide  (LASIX ) 20 MG tablet LASIX  20 MG AS NEEDED FOR 3 POUND WEIGHT GAIN OVER NIGHT AND 5 POUND WEIGHT GAIN IN A WEEK. 09/11/23   Palmer Bobo L, NP  gabapentin  (NEURONTIN ) 300 MG capsule Take 1 capsule (300 mg total) by mouth 4 (four) times daily. Follow-up appt due in Sept must see provider for future refills 05/07/23   Plotnikov, Oakley Bellman, MD  magnesium  hydroxide (MILK OF MAGNESIA) 400 MG/5ML suspension Take 15 mLs by mouth daily as needed for mild constipation.    [provider]  meclizine  (ANTIVERT ) 12.5 MG tablet TAKE 1 TABLET BY MOUTH 3 TIMES DAILY AS NEEDED FOR DIZZINESS. 10/04/22   Plotnikov, Aleksei V, MD  Multiple Vitamins-Minerals (PRESERVISION AREDS PO) Take 2 tablets by mouth 2 (two) times daily.    [provider]  Oxycodone  HCl 10 MG TABS Take 10 mg by mouth 5 (five) times daily as needed (pain). Typically only uses  4 times daily    [provider]  Polyethyl Glycol-Propyl Glycol (SYSTANE OP) Place 1 drop into both eyes 2 (two) times daily as needed (dry eyes). [facility] O[facility]    [provider]  pravastatin  (PRAVACHOL ) 20 MG tablet TAKE 1 TABLET (20 MG TOTAL) BY MOUTH DAILY. 08/24/23   Plotnikov, Aleksei V, MD  rOPINIRole  (REQUIP ) 2 MG tablet TAKE 1 TABLET BY MOUTH AT AT NOON AND 1 TAB AT 4PM AND 1 TAB AT BEDTIME 10/11/22   Plotnikov, Aleksei V, MD  tamsulosin  (FLOMAX ) 0.4 MG CAPS capsule Take 1 capsule (0.4 mg total) by mouth in the morning. 11/21/22   Plotnikov, Oakley Bellman, MD  terazosin  (HYTRIN ) 2 MG capsule TAKE 1 CAPSULE BY MOUTH EVERY NIGHT AT BEDTIME  09/21/23   Plotnikov, Aleksei V, MD      Allergies    Levofloxacin    Review of Systems   Review of Systems  Physical Exam Updated Vital Signs BP 133/69   Pulse 78   Temp 99.2 F (37.3 C) (Oral)   Resp 16   SpO2 94%  Physical Exam Vitals and nursing note reviewed.  Constitutional:      General: He is not in acute distress.    Appearance: He is well-developed. He is not diaphoretic.  HENT:     Head: Normocephalic and atraumatic.  Eyes:     General: No scleral icterus.    Conjunctiva/sclera: Conjunctivae normal.  Cardiovascular:     Rate and Rhythm: Normal rate and regular rhythm.     Heart sounds: Normal heart sounds.  Pulmonary:     Effort: Pulmonary effort is normal. No respiratory distress.     Breath sounds: Normal breath sounds.  Abdominal:     Palpations: Abdomen is soft.     Tenderness: There is no abdominal tenderness.  Musculoskeletal:     Cervical back: Normal range of motion and neck supple.     Comments: Left elbow is hot to the touch, unable to range due to severe pain.  Skin:    General: Skin is warm and dry.  Neurological:     Mental Status: He is alert.  Psychiatric:        Behavior: Behavior normal.     ED Results / Procedures / Treatments   Labs (all labs ordered are listed, but only abnormal results are displayed) Labs Reviewed  CULTURE, BLOOD (ROUTINE X 2)  CULTURE, BLOOD (ROUTINE X 2)  CBC WITH DIFFERENTIAL/PLATELET  SEDIMENTATION RATE  C-REACTIVE PROTEIN  CBG MONITORING, ED  I-STAT CG4 LACTIC ACID, ED    EKG None  Radiology No results found.  Procedures .Joint Aspiration/Arthrocentesis  Date/Time: 10/21/2023 3:13 PM  Performed by: Tama Fails, PA-C Authorized by: Tama Fails, PA-C   Consent:    Consent obtained:  Verbal   Consent given by:  Patient   Risks discussed:  Bleeding, incomplete drainage, infection and pain Universal protocol:    Patient identity confirmed:  Arm band Location:    Location:  Elbow    Elbow:  L elbow Anesthesia:    Anesthesia method:  Local infiltration   Local anesthetic:  Lidocaine  1% w/o epi Procedure details:    Preparation: Patient was prepped and draped in usual sterile fashion     Needle gauge:  18 G   Approach:  Lateral   Aspirate amount:  6 ml CBC with elevated white blood cell count of 13,000 up from   Aspirate characteristics:  Purulent and bloody (opaque) Post-procedure details:  Dressing:  Adhesive bandage   Procedure completion:  Tolerated well, no immediate complications     Medications Ordered in ED Medications  fentaNYL  (SUBLIMAZE ) injection 50 mcg (has no administration in time range)    ED Course/ Medical Decision Making/ A&P Clinical Course as of 10/21/23 1512  Sun Oct 21, 2023  1259 WBC(!): 13.0 [AH]  1420 Discussed with Dr. Bernard Brick who states that we should plan on admission the hospitalist service.  He is going to get in touch with His Colleagues to See If He Would Be Better Suited for Admission at [facility] or [facility] and Will Return My Call. [AH]  1506 Discussed with Dr. Bernard Brick he wants the patient admitted to [facility]. Start antibiotics. [AH]    Clinical Course User Index [AH] Tama Fails, PA-C                                 Medical Decision Making Amount and/or Complexity of Data Reviewed Labs: ordered. Decision-making details documented in ED Course. Radiology: ordered.  Risk Prescription drug management. Decision regarding hospitalization.   This patient presents to the ED with chief complaint(s) of above pain with pertinent past medical history of previous ORIF on steroids which further complicates the presenting complaint. The complaint involves an extensive differential diagnosis and treatment options and also carries with it a high risk of complications and morbidity.    The differential diagnosis includes septic joint gout  The initial plan is to order labs including sed rate CRP blood culture basic labs and imaging  including elbow x-ray and to treat patient with pain medication for the pain   Additional history obtained: Additional history obtained from family Records reviewed Care Everywhere/External Records  Reassessment and review (also see workup area): Lab Tests: I Ordered, and personally interpreted labs.  The pertinent results include:   CBC with white blood cell count of 13,000 up from 4,10 days ago Lactic acid within normal limits With mild hypokalemia  (repleated orally)   Imaging Studies: I ordered and independently visualized and interpreted the following imaging X-ray of the left elbow   which showed old hardware,, potential retained foreign bodies.  Discussed with Dr. Bernard Brick states that these were seen on previous imaging at their facility. The interpretation of the imaging was limited to assessing for emergent pathology, for which purpose it was ordered.  Consultations Obtained: I requested consultation with the consultant Dr Bernard Brick , and discussed  findings as well as pertinent plan -hands as per ED course  Medicines ordered and prescription drug management: I ordered the following medications pain medications including baseline oxycodone  and IV pain medicines as well as cefepime  and vancomycin  for suspected septic arthritis    Reevaluation of the patient after these medicines showed that the patient     patient's pain did not improve significantly    Cardiac Monitoring: The patient was maintained on a cardiac monitor.  I personally viewed and interpreted the cardiac monitor which showed an underlying rhythm of:  sinus rhythm  Complexity of problems addressed: Patient's presentation is most consistent with  acute presentation with potential threat to life or bodily function During patient's assessment  Disposition: After consideration of the diagnostic results and the patient's response to treatment,  I feel that the patent would benefit from admission for septic arthritis L  elbow .          Final Clinical Impression(s) / ED Diagnoses Final diagnoses:  None    Rx / DC Orders ED Discharge Orders     None         Tama Fails, PA-C 10/23/23 2841    Auston Blush, MD 11/02/23 1141

## 2023-10-21 NOTE — Assessment & Plan Note (Addendum)
 On TUMs PRN

## 2023-10-21 NOTE — Assessment & Plan Note (Addendum)
 10-24-2023 Continue flomax  and proscar    10-25-2023 stable.  10-26-2023 stable.

## 2023-10-21 NOTE — Assessment & Plan Note (Addendum)
 10-21-2023 through 10-22-2021. -followed by Dr. Rosaline Coma. Diagnosed 12/2021. Myelodysplastic Syndrome, Single Lineage Dysplasia -12/19/2021: Bone marrow biopsy which showed a low-grade myelodysplastic syndrome.  - Started retacrit  20,000 units q 2 weeks  10-24-2023 stable.  10-25-2023 stable.  10-26-2023 2 units PRBC ordered for transfusion due to HgB of 6.9. PRBC will be given prior to DC. Give lasix  20 mg IV after transfusion.

## 2023-10-21 NOTE — Progress Notes (Signed)
 ED Pharmacy Antibiotic Sign Off An antibiotic consult was received from an ED provider for vancomycin  and cefepime  per pharmacy dosing for septic joint. A chart review was completed to assess appropriateness.   The following one time order(s) were placed:  Vancomycin  1500 mg IV x1 and cefepime  2gm IV x1  Further antibiotic and/or antibiotic pharmacy consults should be ordered by the admitting provider if indicated.   Thank you for allowing pharmacy to be a part of this patient's care.   Spurgeon Dyer, [facility]  Clinical Pharmacist 10/21/23 3:15 PM

## 2023-10-21 NOTE — Progress Notes (Signed)
 Pharmacy Antibiotic Note  Rachael Zapanta Manthey Marieta Shorten. is a 83 y.o. male admitted on 10/21/2023 presenting with elbow pain, concern for septic arthritis.  Pharmacy has been consulted for vancomycin  dosing.  Vancomycin  1500 mg IV x 1 given in ED  Plan: Vancomycin  1250 mg IV q 24h (eAUC 458) Monitor renal function, Cx and clinical progression to narrow Vancomycin  levels as indicated    Height: 5\' 9"  (175.3 cm) Weight: 76.3 kg (168 lb 3.4 oz) IBW/kg (Calculated) : 70.7  Temp (24hrs), Avg:98.1 F (36.7 C), Min:97.5 F (36.4 C), Max:99.2 F (37.3 C)  Recent Labs  Lab 10/21/23 1127 10/21/23 1139 10/21/23 1329  WBC 13.0*  --   --   CREATININE  --   --  1.01  LATICACIDVEN  --  0.7  --     Estimated Creatinine Clearance: 54.4 mL/min (by C-G formula based on SCr of 1.01 mg/dL).    Allergies  Allergen Reactions   Levofloxacin Other (See Comments)    REACTION: hands pealed    Trinidad Funk, PharmD, [facility] Clinical Pharmacist ED Pharmacist Phone # [phone] 10/21/2023 7:12 PM

## 2023-10-21 NOTE — Assessment & Plan Note (Signed)
 Continue norvasc , hytrin 

## 2023-10-21 NOTE — Assessment & Plan Note (Signed)
 Check magnesium  Repleted in ED Trend

## 2023-10-21 NOTE — Assessment & Plan Note (Addendum)
 83 year old presenting to ED with acute onset of pain in his left elbow, decreased mobility, warm to touch and labs concerning for possibly infected joint. He has history of an open fracture in his left elbow in 2007 and had surgery with hardware. About 4 years ago one of the pins came lose and this was taken out.  -admit to [facility] -ortho consulted (Dr. Bernard Brick)  -joint aspiration obtained by EDP and described as purulent. Gram stain showing gram positive cocci -continue vancomycin . Change cefepime  to rocephin   -check inflammatory markers -check uric acid  -no hx of gout or pseudo gout, but follow synovial fluid for crystals  -trend PCT  -CT/other imaging per ortho  -pain control difficult. Continue home oxycodone  five times/day. Morphine  q 2 hours for severe pain, may need dilaudid , but transferring to [facility].

## 2023-10-21 NOTE — Assessment & Plan Note (Addendum)
 10-21-2023 through 10-23-2023 Lexiscan  stress test 2/19 was overall low risk with no ischemia identified.   CT cardiac scoring 07/2018 showed coronary calcium  score of 41 (15th percentile). Continue statin/ASA   10-24-2023 stable.  10-25-2023 stable.  10-26-2023 stable.

## 2023-10-22 DIAGNOSIS — R7881 Bacteremia: Secondary | ICD-10-CM

## 2023-10-22 DIAGNOSIS — B9561 Methicillin susceptible Staphylococcus aureus infection as the cause of diseases classified elsewhere: Secondary | ICD-10-CM | POA: Diagnosis not present

## 2023-10-22 DIAGNOSIS — D696 Thrombocytopenia, unspecified: Secondary | ICD-10-CM

## 2023-10-22 DIAGNOSIS — M25522 Pain in left elbow: Secondary | ICD-10-CM | POA: Diagnosis not present

## 2023-10-22 DIAGNOSIS — M009 Pyogenic arthritis, unspecified: Secondary | ICD-10-CM | POA: Diagnosis not present

## 2023-10-22 DIAGNOSIS — M00022 Staphylococcal arthritis, left elbow: Secondary | ICD-10-CM | POA: Diagnosis not present

## 2023-10-22 DIAGNOSIS — E876 Hypokalemia: Secondary | ICD-10-CM

## 2023-10-22 LAB — CBC
HCT: 29.6 % — ABNORMAL LOW (ref 39.0–52.0)
Hemoglobin: 9.1 g/dL — ABNORMAL LOW (ref 13.0–17.0)
MCH: 24 pg — ABNORMAL LOW (ref 26.0–34.0)
MCHC: 30.7 g/dL (ref 30.0–36.0)
MCV: 78.1 fL — ABNORMAL LOW (ref 80.0–100.0)
Platelets: 90 10*3/uL — ABNORMAL LOW (ref 150–400)
RBC: 3.79 MIL/uL — ABNORMAL LOW (ref 4.22–5.81)
RDW: 18.3 % — ABNORMAL HIGH (ref 11.5–15.5)
WBC: 21.4 10*3/uL — ABNORMAL HIGH (ref 4.0–10.5)
nRBC: 0 % (ref 0.0–0.2)

## 2023-10-22 LAB — BLOOD CULTURE ID PANEL (REFLEXED) - BCID2

## 2023-10-22 LAB — BASIC METABOLIC PANEL WITH GFR
Anion gap: 9 (ref 5–15)
BUN: 19 mg/dL (ref 8–23)
CO2: 21 mmol/L — ABNORMAL LOW (ref 22–32)
Calcium: 8.3 mg/dL — ABNORMAL LOW (ref 8.9–10.3)
Chloride: 104 mmol/L (ref 98–111)
Creatinine, Ser: 1.15 mg/dL (ref 0.61–1.24)
GFR, Estimated: >60 mL/min (ref >60)
Glucose, Bld: 175 mg/dL — ABNORMAL HIGH (ref 70–99)
Potassium: 3.3 mmol/L — ABNORMAL LOW (ref 3.5–5.1)
Sodium: 134 mmol/L — ABNORMAL LOW (ref 135–145)

## 2023-10-22 LAB — PROCALCITONIN: Procalcitonin: 6.28 ng/mL

## 2023-10-22 MED ORDER — POTASSIUM CHLORIDE CRYS ER 20 MEQ PO TBCR
40.0000 meq | EXTENDED_RELEASE_TABLET | Freq: Once | ORAL | Status: AC
  Administered 2023-10-22: 40 meq via ORAL
  Filled 2023-10-22: qty 2

## 2023-10-22 MED ORDER — MAGNESIUM SULFATE 2 GM/50ML IV SOLN
2.0000 g | Freq: Once | INTRAVENOUS | Status: AC
Start: 2023-10-22 — End: 2023-10-23
  Administered 2023-10-23: 2 g via INTRAVENOUS
  Filled 2023-10-22: qty 50

## 2023-10-22 MED ORDER — CEFAZOLIN SODIUM-DEXTROSE 2-4 GM/100ML-% IV SOLN
2.0000 g | Freq: Three times a day (TID) | INTRAVENOUS | Status: DC
  Administered 2023-10-22 – 2023-10-26 (×13): 2 g via INTRAVENOUS
  Filled 2023-10-22 (×15): qty 100

## 2023-10-22 NOTE — Progress Notes (Signed)
 PHARMACY - PHYSICIAN COMMUNICATION CRITICAL VALUE ALERT - BLOOD CULTURE IDENTIFICATION (BCID)  Justin [name]. is an 83 y.o. male who presented to [facility] on 10/21/2023 with a chief complaint of L elbow pain/septic arthiritis  Assessment:   Blood cultures growing MSSA  Name of physician (or Provider) Contacted:  Dr. Brock Canner  Current antibiotics:  Vancomycin  and Rocephin   Changes to prescribed antibiotics recommended:  Change to Ancef  2 g IV q8h  Results for orders placed or performed during the hospital encounter of 10/21/23  Blood Culture ID Panel (Reflexed) (Collected: 10/21/2023 11:27 AM)  Result Value Ref Range   Enterococcus faecalis NOT DETECTED NOT DETECTED   Enterococcus Faecium NOT DETECTED NOT DETECTED   Listeria monocytogenes NOT DETECTED NOT DETECTED   Staphylococcus species DETECTED (A) NOT DETECTED   Staphylococcus aureus (BCID) DETECTED (A) NOT DETECTED   Staphylococcus epidermidis NOT DETECTED NOT DETECTED   Staphylococcus lugdunensis NOT DETECTED NOT DETECTED   Streptococcus species NOT DETECTED NOT DETECTED   Streptococcus agalactiae NOT DETECTED NOT DETECTED   Streptococcus pneumoniae NOT DETECTED NOT DETECTED   Streptococcus pyogenes NOT DETECTED NOT DETECTED   A.calcoaceticus-baumannii NOT DETECTED NOT DETECTED   Bacteroides fragilis NOT DETECTED NOT DETECTED   Enterobacterales NOT DETECTED NOT DETECTED   Enterobacter cloacae complex NOT DETECTED NOT DETECTED   Escherichia coli NOT DETECTED NOT DETECTED   Klebsiella aerogenes NOT DETECTED NOT DETECTED   Klebsiella oxytoca NOT DETECTED NOT DETECTED   Klebsiella pneumoniae NOT DETECTED NOT DETECTED   Proteus species NOT DETECTED NOT DETECTED   Salmonella species NOT DETECTED NOT DETECTED   Serratia marcescens NOT DETECTED NOT DETECTED   Haemophilus influenzae NOT DETECTED NOT DETECTED   Neisseria meningitidis NOT DETECTED NOT DETECTED   Pseudomonas aeruginosa NOT DETECTED NOT DETECTED    Stenotrophomonas maltophilia NOT DETECTED NOT DETECTED   Candida albicans NOT DETECTED NOT DETECTED   Candida auris NOT DETECTED NOT DETECTED   Candida glabrata NOT DETECTED NOT DETECTED   Candida krusei NOT DETECTED NOT DETECTED   Candida parapsilosis NOT DETECTED NOT DETECTED   Candida tropicalis NOT DETECTED NOT DETECTED   Cryptococcus neoformans/gattii NOT DETECTED NOT DETECTED   Meth resistant mecA/C and MREJ NOT DETECTED NOT DETECTED    Justin [name] 10/22/2023  6:16 AM

## 2023-10-22 NOTE — Plan of Care (Signed)

## 2023-10-22 NOTE — Consult Note (Signed)
 Reason for Consult:Left elbow infection Referring Physician: Redge Cancel Time called: 1610 Time at bedside: 0850   Justin [name]. is an 83 y.o. male.  HPI: [place] began to develop left elbow pain on Saturday morning. He denies any antecedent event. The pain quickly increased to excruciating and he was evaluated in the ED. Workup, including tap, showed a septic elbow and orthopedic surgery was consulted. He denies fevers, chills, sweats, N/V. Although he has had some pain in that elbow intermittently since ORIF there has been nothing like this.  Past Medical History:  Diagnosis Date   Alcoholism (HCC)    Dry 13 years   Basal cell carcinoma    Right Chest   BPH (benign prostatic hyperplasia)    Diverticulosis of colon    Dizzy spells    none recent   DJD (degenerative joint disease)    lower back   Elbow fracture, left 2009   Dr Guyann Leitz   GERD (gastroesophageal reflux disease)    Glaucoma    History of TIAs 1 yrs ago   Hyperkeratosis    LBP (low back pain)    Macular degeneration    left wet right dry sees dr Seward Dao   Melanoma [facility]) 2009   x3 Dr Amy [place]   Osteoarthritis    Restless leg syndrome    Thrombocytopenia (HCC) 11/18/2021   Tinnitus    Tobacco abuse    Uses walker    Wears glasses    reading   Wears partial dentures    upper    Past Surgical History:  Procedure Laterality Date   CATARACT EXTRACTION Bilateral 2018   HARDWARE REMOVAL Left 07/09/2020   Procedure: Left elbow hardware removal;  Surgeon: Janeth Medicus, MD;  Location: [facility];  Service: Orthopedics;  Laterality: Left;  60 mins   mda     Dr. Seward Dao wet injection   MELANOMA EXCISION  2009   x3   RECONSTRUCTION MEDIAL COLLATERAL LIGAMENT ELBOW W/ TENDON GRAFT  2009   Left, Dr Guyann Leitz   TONSILLECTOMY  as child   TOTAL KNEE ARTHROPLASTY Right 11/09/2020   Procedure: TOTAL KNEE ARTHROPLASTY;  Surgeon: Claiborne Crew, MD;  Location: [facility];  Service: Orthopedics;   Laterality: Right;    Family History  Problem Relation Age of Onset   Cancer Mother        ?breast   Cancer Father        Lung    Social History:  reports that he quit smoking about 39 years ago. His smoking use included cigarettes. He started smoking about 77 years ago. He has a 114 pack-year smoking history. He has never used smokeless tobacco. He reports that he does not currently use drugs. He reports that he does not drink alcohol .  Allergies:  Allergies  Allergen Reactions   Levofloxacin Other (See Comments)    REACTION: hands pealed    Medications: I have reviewed the patient's current medications.  Results for orders placed or performed during the hospital encounter of 10/21/23 (from the past 48 hours)  POC CBG, ED     Status: None   Collection Time: 10/21/23 10:08 AM  Result Value Ref Range   Glucose-Capillary 92 70 - 99 mg/dL    Comment: Glucose reference range applies only to samples taken after fasting for at least 8 hours.  CBC with Differential     Status: Abnormal   Collection Time: 10/21/23 11:27 AM  Result Value Ref Range   WBC  13.0 (H) 4.0 - 10.5 K/uL   RBC 3.78 (L) 4.22 - 5.81 MIL/uL   Hemoglobin 9.0 (L) 13.0 - 17.0 g/dL   HCT 86.5 (L) 78.4 - 69.6 %   MCV 79.1 (L) 80.0 - 100.0 fL   MCH 23.8 (L) 26.0 - 34.0 pg   MCHC 30.1 30.0 - 36.0 g/dL   RDW 29.5 (H) 28.4 - 13.2 %   Platelets 104 (L) 150 - 400 K/uL    Comment: Immature Platelet Fraction may be clinically indicated, consider ordering this additional test [id number]    nRBC 0.0 0.0 - 0.2 %   Neutrophils Relative % 50 %   Neutro Abs 6.5 1.7 - 7.7 K/uL   Lymphocytes Relative 3 %   Lymphs Abs 0.4 (L) 0.7 - 4.0 K/uL   Monocytes Relative 46 %   Monocytes Absolute 5.9 (H) 0.1 - 1.0 K/uL   Eosinophils Relative 0 %   Eosinophils Absolute 0.0 0.0 - 0.5 K/uL   Basophils Relative 0 %   Basophils Absolute 0.0 0.0 - 0.1 K/uL   Immature Granulocytes 1 %   Abs Immature Granulocytes 0.10 (H) 0.00 - 0.07 K/uL     Comment: Performed at [facility], [address] [place], [place] [address]  Blood Cultures x 2 sites     Status: None (Preliminary result)   Collection Time: 10/21/23 11:27 AM   Specimen: Site Not Specified; Blood  Result Value Ref Range   Specimen Description      SITE NOT SPECIFIED Performed at [facility], [address] [place], [place] [address]    Special Requests      BOTTLES DRAWN AEROBIC AND ANAEROBIC Blood Culture adequate volume Performed at [facility], [address], [place], [place] [address]    Culture  Setup Time      GRAM POSITIVE COCCI IN CLUSTERS IN BOTH AEROBIC AND ANAEROBIC BOTTLES CRITICAL RESULT CALLED TO, READ BACK BY AND VERIFIED WITH: PHARMD G ABBOTT 10/22/2023 @ 0610 BY AB Performed at [facility], [address] [place], [place] [address]    Culture GRAM POSITIVE COCCI    Report Status PENDING   Sedimentation rate     Status: None   Collection Time: 10/21/23 11:27 AM  Result Value Ref Range   Sed Rate 12 0 - 16 mm/hr    Comment: Performed at [facility], [address], [place], [place] [address]  C-reactive protein     Status: Abnormal   Collection Time: 10/21/23 11:27 AM  Result Value Ref Range   CRP 9.3 (H) <1.0 mg/dL    Comment: Performed at [facility], [address], [place], [place] [address]  Blood Culture ID Panel (Reflexed)     Status: Abnormal   Collection Time: 10/21/23 11:27 AM  Result Value Ref Range   Enterococcus faecalis NOT DETECTED NOT DETECTED   Enterococcus Faecium NOT DETECTED NOT DETECTED   Listeria monocytogenes NOT DETECTED NOT DETECTED   Staphylococcus species DETECTED (A) NOT DETECTED    Comment: CRITICAL RESULT CALLED TO, READ BACK BY AND VERIFIED WITH: PHARMD G ABBOTT 10/22/2023 @ 0610 BY AB    Staphylococcus aureus (BCID) DETECTED (A) NOT DETECTED    Comment: CRITICAL RESULT CALLED TO, READ BACK BY AND VERIFIED  WITH: PHARMD G ABBOTT 10/22/2023 @ 0610 BY AB    Staphylococcus epidermidis NOT DETECTED NOT DETECTED   Staphylococcus lugdunensis NOT DETECTED NOT DETECTED   Streptococcus species NOT DETECTED  NOT DETECTED   Streptococcus agalactiae NOT DETECTED NOT DETECTED   Streptococcus pneumoniae NOT DETECTED NOT DETECTED   Streptococcus pyogenes NOT DETECTED NOT DETECTED   A.calcoaceticus-baumannii NOT DETECTED NOT DETECTED   Bacteroides fragilis NOT DETECTED NOT DETECTED   Enterobacterales NOT DETECTED NOT DETECTED   Enterobacter cloacae complex NOT DETECTED NOT DETECTED   Escherichia coli NOT DETECTED NOT DETECTED   Klebsiella aerogenes NOT DETECTED NOT DETECTED   Klebsiella oxytoca NOT DETECTED NOT DETECTED   Klebsiella pneumoniae NOT DETECTED NOT DETECTED   Proteus species NOT DETECTED NOT DETECTED   Salmonella species NOT DETECTED NOT DETECTED   Serratia marcescens NOT DETECTED NOT DETECTED   Haemophilus influenzae NOT DETECTED NOT DETECTED   Neisseria meningitidis NOT DETECTED NOT DETECTED   Pseudomonas aeruginosa NOT DETECTED NOT DETECTED   Stenotrophomonas maltophilia NOT DETECTED NOT DETECTED   Candida albicans NOT DETECTED NOT DETECTED   Candida auris NOT DETECTED NOT DETECTED   Candida glabrata NOT DETECTED NOT DETECTED   Candida krusei NOT DETECTED NOT DETECTED   Candida parapsilosis NOT DETECTED NOT DETECTED   Candida tropicalis NOT DETECTED NOT DETECTED   Cryptococcus neoformans/gattii NOT DETECTED NOT DETECTED   Meth resistant mecA/C and MREJ NOT DETECTED NOT DETECTED    Comment: Performed at [facility], [address] [place], [place] [address]  I-Stat Lactic Acid, ED     Status: None   Collection Time: 10/21/23 11:39 AM  Result Value Ref Range   Lactic Acid, Venous 0.7 0.5 - 1.9 mmol/L  Blood Cultures x 2 sites     Status: None (Preliminary result)   Collection Time: 10/21/23 12:19 PM   Specimen: BLOOD  Result Value Ref Range   Specimen Description      BLOOD  BLOOD RIGHT ARM Performed at [facility], [address], [place], [place] [address]    Special Requests      BOTTLES DRAWN AEROBIC AND ANAEROBIC Blood Culture adequate volume Performed at [facility], [address] [place], [place] [address]    Culture  Setup Time      GRAM POSITIVE COCCI IN CLUSTERS ANAEROBIC BOTTLE ONLY CRITICAL VALUE NOTED.  VALUE IS CONSISTENT WITH PREVIOUSLY REPORTED AND CALLED VALUE. Performed at [facility], [address], [place], [place] [address]    Culture GRAM POSITIVE COCCI    Report Status PENDING   Comprehensive metabolic panel     Status: Abnormal   Collection Time: 10/21/23  1:29 PM  Result Value Ref Range   Sodium 137 135 - 145 mmol/L   Potassium 3.1 (L) 3.5 - 5.1 mmol/L   Chloride 106 98 - 111 mmol/L   CO2 23 22 - 32 mmol/L   Glucose, Bld 85 70 - 99 mg/dL    Comment: Glucose reference range applies only to samples taken after fasting for at least 8 hours.   BUN 24 (H) 8 - 23 mg/dL   Creatinine, Ser 1.30 0.61 - 1.24 mg/dL   Calcium  8.6 (L) 8.9 - 10.3 mg/dL   Total Protein 6.0 (L) 6.5 - 8.1 g/dL   Albumin  3.0 (L) 3.5 - 5.0 g/dL   AST 16 15 - 41 U/L   ALT 11 0 - 44 U/L   Alkaline Phosphatase 60 38 - 126 U/L   Total Bilirubin 1.0 0.0 - 1.2 mg/dL   GFR, Estimated >86 >57 mL/min    Comment: (NOTE) Calculated using the CKD-EPI Creatinine Equation (2021)    Anion gap 8  5 - 15    Comment: Performed at [facility] [address], [place], [place] [address]  CBG monitoring, ED     Status: None   Collection Time: 10/21/23  1:48 PM  Result Value Ref Range   Glucose-Capillary 78 70 - 99 mg/dL    Comment: Glucose reference range applies only to samples taken after fasting for at least 8 hours.  Gram stain     Status: None   Collection Time: 10/21/23  1:59 PM   Specimen: Synovium; Body Fluid  Result Value Ref Range   Specimen Description SYNOVIAL    Special Requests LEFT ELBOW     Gram Stain      ABUNDANT WBC SEEN GRAM POSITIVE COCCI Gram Stain Report Called to,Read Back By and Verified With: Alonna Art RN AT 1529 ON 10/21/2023 BY Anibal Kent Performed at [facility], [address] [place], [place] [address]    Report Status 10/21/2023 FINAL   Cell count + diff,  w/ cryst-synvl fld     Status: Abnormal   Collection Time: 10/21/23  1:59 PM  Result Value Ref Range   Color, Synovial RED (A) YELLOW   Appearance-Synovial TURBID (A) CLEAR   Crystals, Fluid INTRACELLULAR CALCIUM  PYROPHOSPHATE CRYSTALS    WBC, Synovial UNABLE TO PERFORM COUNT DUE TO CLOT IN SPECIMEN 0 - 200 /cu mm   Neutrophil, Synovial 58 (H) 0 - 25 %   Lymphocytes-Synovial Fld 16 0 - 20 %   Monocyte-Macrophage-Synovial Fluid 26 (L) 50 - 90 %   Other Cells-SYN      Intra and/or extracellular organisms present, correlate with Microbiology.    Comment: Performed at [facility], [address], [place], [place] [address]  Magnesium      Status: None   Collection Time: 10/21/23  4:12 PM  Result Value Ref Range   Magnesium  1.7 1.7 - 2.4 mg/dL    Comment: Performed at [facility], [address] [place], [place] [address]  Procalcitonin     Status: None   Collection Time: 10/21/23  4:12 PM  Result Value Ref Range   Procalcitonin 1.21 ng/mL    Comment:        Interpretation: PCT > 0.5 ng/mL and <= 2 ng/mL: Systemic infection (sepsis) is possible, but other conditions are known to elevate PCT as well. (NOTE)       Sepsis PCT Algorithm           Lower Respiratory Tract                                      Infection PCT Algorithm    ----------------------------     ----------------------------         PCT < 0.25 ng/mL                PCT < 0.10 ng/mL          Strongly encourage             Strongly discourage   discontinuation of antibiotics    initiation of antibiotics    ----------------------------     -----------------------------        PCT 0.25 - 0.50 ng/mL            PCT 0.10 - 0.25 ng/mL               OR       >  80% decrease in PCT            Discourage initiation of                                            antibiotics      Encourage discontinuation           of antibiotics    ----------------------------     -----------------------------         PCT >= 0.50 ng/mL              PCT 0.26 - 0.50 ng/mL                AND       <80% decrease in PCT             Encourage initiation of                                             antibiotics       Encourage continuation           of antibiotics    ----------------------------     -----------------------------        PCT >= 0.50 ng/mL                  PCT > 0.50 ng/mL               AND         increase in PCT                  Strongly encourage                                      initiation of antibiotics    Strongly encourage escalation           of antibiotics                                     -----------------------------                                           PCT <= 0.25 ng/mL                                                 OR                                        > 80% decrease in PCT                                      Discontinue / Do not initiate  antibiotics  Performed at [facility], [address], [place], [place] [address]   Uric acid     Status: None   Collection Time: 10/21/23  6:05 PM  Result Value Ref Range   Uric Acid, Serum 4.6 3.7 - 8.6 mg/dL    Comment: Performed at [facility], [address], [place], [place] [address]  CBC     Status: Abnormal   Collection Time: 10/22/23  6:54 AM  Result Value Ref Range   WBC 21.4 (H) 4.0 - 10.5 K/uL   RBC 3.79 (L) 4.22 - 5.81 MIL/uL   Hemoglobin 9.1 (L) 13.0 - 17.0 g/dL   HCT 09.8 (L) 11.9 - 14.7 %   MCV 78.1 (L) 80.0 - 100.0 fL   MCH 24.0 (L) 26.0 - 34.0 pg   MCHC 30.7 30.0 - 36.0 g/dL   RDW 82.9 (H) 56.2 - 13.0 %    Platelets 90 (L) 150 - 400 K/uL    Comment: Immature Platelet Fraction may be clinically indicated, consider ordering this additional test [id number] REPEATED TO VERIFY    nRBC 0.0 0.0 - 0.2 %    Comment: Performed at [facility], [address], [place], [place] [address]    DG Elbow Complete Left Result Date: 10/21/2023 CLINICAL DATA:  Swelling. Hot, red, mobile. Left elbow pain radiating down left arm. Left elbow surgery years ago. EXAM: LEFT ELBOW - COMPLETE 3+ VIEW COMPARISON:  Left elbow radiographs 12/20/2007 FINDINGS: Remote distal lateral and distal medial humeral plate and screw fracture fixation, and proximal posterior olecranon plate and screw fracture fixation, also seen on 12/20/2007 prior radiographs. Bone overlap limits evaluation, however there appears to be severe capitellum-radial head and trochlea-coronoid joint space narrowing. Subchondral sclerosis is seen within the proximal coronoid. There may be partial mid medial elbow arthrodesis. No acute fracture or dislocation.  No evidence of hardware failure. No definite elbow joint effusion. There is a linear 22 x less than 1 mm metallic possible needle within the soft tissues medial to the proximal diaphysis of the ulna, appearing within approximately 1.2 cm of the medial proximal forearm skin surface. IMPRESSION: 1. Remote distal humeral and proximal olecranon plate and screw fracture fixation. No evidence of hardware failure. 2. 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 3. There is a linear 22 x less than 1 mm metallic possible needle within the soft tissues medial to the proximal diaphysis of the ulna, appearing within approximately 1.2 cm of the medial proximal forearm skin surface. Recommend clinical correlation. Electronically Signed   By: Bertina Broccoli M.D.   On: 10/21/2023 12:15    Review of Systems  Constitutional:  Negative for chills, diaphoresis and fever.  HENT:  Negative for ear  discharge, ear pain, hearing loss and tinnitus.   Eyes:  Negative for photophobia and pain.  Respiratory:  Negative for cough and shortness of breath.   Cardiovascular:  Negative for chest pain.  Gastrointestinal:  Negative for abdominal pain, nausea and vomiting.  Genitourinary:  Negative for dysuria, flank pain, frequency and urgency.  Musculoskeletal:  Positive for arthralgias (Left shoulder). Negative for back pain, myalgias and neck pain.  Neurological:  Negative for dizziness and headaches.  Hematological:  Does not bruise/bleed easily.  Psychiatric/Behavioral:  The patient is not nervous/anxious.    Blood pressure (!) 114/59, pulse 99, temperature 98.6 F (37 C), temperature source Oral, resp. rate 18, height 5\' 9"  (1.753 m), weight 76.3 kg, SpO2 94%. Physical Exam Constitutional:  General: He is not in acute distress.    Appearance: He is well-developed. He is not diaphoretic.  HENT:     Head: Normocephalic and atraumatic.  Eyes:     General: No scleral icterus.       Right eye: No discharge.        Left eye: No discharge.     Conjunctiva/sclera: Conjunctivae normal.  Cardiovascular:     Rate and Rhythm: Normal rate and regular rhythm.  Pulmonary:     Effort: Pulmonary effort is normal. No respiratory distress.  Musculoskeletal:     Cervical back: Normal range of motion.     Comments: Left shoulder, elbow, wrist, digits- no skin wounds, severe TTP, severe pain with ROM, no instability, no blocks to motion  Sens  Ax/R/M/U intact  Mot   Ax/ R/ PIN/ M/ AIN/ U intact  Rad 2+  Skin:    General: Skin is warm and dry.  Neurological:     Mental Status: He is alert.  Psychiatric:        Mood and Affect: Mood normal.        Behavior: Behavior normal.     Assessment/Plan: Left elbow infection -- Plan I&D, hardware removal tomorrow with Dr. Guyann Leitz. Please keep NPO after MN. Will place splint to see if immobilization will help with the pain.    Georganna Kin,  PA-C Orthopedic Surgery [phone] 10/22/2023, 9:04 AM

## 2023-10-22 NOTE — Consult Note (Addendum)
 [facility]  Total days of antibiotics 2               Reason for Consult:MSSA bacteremia in the setting of left elbow HW septic arthritis    Referring Physician: Yvonne Hering, prosper  Principal Problem:   Acute on chronic pain of left elbow concerning for septic arthritis Active Problems:   RLS (restless legs syndrome)   Low back pain   BPH (benign prostatic hyperplasia)   HTN (hypertension)   Coronary artery disease   GERD (gastroesophageal reflux disease)   MDS (myelodysplastic syndrome) (HCC)   Hypokalemia    HPI: Justin [name]. is a 83 y.o. male with hx of BCC, MDS, who had remote surgery in 2007 to left elbow. He has had new onset of left elbow pain without trauma for which he describes as excruitating. He states that purulent fluid was aspirated in the ED from his elbow. He was evaluated by orthopedics, dr handy who did original surgery will be taking patient to the OR tomorrow for I x D, HW removal. Interestingly - plan films show possible needle in the soft tissue near ulna? Unclear if dislodged from original hw of plats and screw fixation from prior surgery. He has wbc of 21K, plt low at 90K. But in the past previously lower down to 30 in feb/jan 2025. He has 1 of 4 bottles positive for MSSA. Left elbow synovial fluid also found to have MSSA. He is currently on cefazolin .  He recently had TTE on 4/15 which did not comment on any vegetations.  Past Medical History:  Diagnosis Date   Alcoholism (HCC)    Dry 13 years   Basal cell carcinoma    Right Chest   BPH (benign prostatic hyperplasia)    Diverticulosis of colon    Dizzy spells    none recent   DJD (degenerative joint disease)    lower back   Elbow fracture, left 2009   Dr Guyann Leitz   GERD (gastroesophageal reflux disease)    Glaucoma    History of TIAs 1 yrs ago   Hyperkeratosis    LBP (low back pain)    Macular degeneration    left wet right dry sees dr Seward Dao   Melanoma  [facility]) 2009   x3 Dr Amy [place]   Osteoarthritis    Restless leg syndrome    Thrombocytopenia (HCC) 11/18/2021   Tinnitus    Tobacco abuse    Uses walker    Wears glasses    reading   Wears partial dentures    upper    Allergies:  Allergies  Allergen Reactions   Levofloxacin Other (See Comments)    REACTION: hands pealed    Current antibiotics:   MEDICATIONS:  amLODipine   5 mg Oral QHS   carbidopa -levodopa   1 tablet Oral QHS   cholecalciferol   3,000 Units Oral Daily   vitamin B-12  2,000 mcg Oral Daily   finasteride   5 mg Oral Daily   gabapentin   300 mg Oral TID   multivitamin  1 tablet Oral BID   oxyCODONE   10 mg Oral 5 X Daily   pravastatin   20 mg Oral Daily   [START ON 10/23/2023] predniSONE   5 mg Oral Q breakfast   rOPINIRole   1 mg Oral QID   tamsulosin   0.4 mg Oral q AM   terazosin   2 mg Oral QHS    Social History   Tobacco Use   Smoking status: Former  Current packs/day: 0.00    Average packs/day: 3.0 packs/day for 38.0 years (114.0 ttl pk-yrs)    Types: Cigarettes    Start date: 47    Quit date: 31    Years since quitting: 39.3   Smokeless tobacco: Never   Tobacco comments:    states he quit May 15 1985  Vaping Use   Vaping status: Never Used  Substance Use Topics   Alcohol  use: No    Comment: PREVIOUS - DRY 25yrs   Drug use: Not Currently    Family History  Problem Relation Age of Onset   Cancer Mother        ?breast   Cancer Father        Lung    Review of Systems -  Right elbow pain, with fevers. 12 point ros is negative  OBJECTIVE: Temp:  [97.5 F (36.4 C)-100.3 F (37.9 C)] 98.6 F (37 C) (04/21 0827) Pulse Rate:  [93-109] 99 (04/21 0827) Resp:  [18] 18 (04/21 0827) BP: (114-153)/(59-78) 114/59 (04/21 0827) SpO2:  [94 %-98 %] 94 % (04/21 0827) Weight:  [76.3 kg] 76.3 kg (04/20 1906) Physical Exam  Constitutional: He is oriented to person, place, and time. He appears well-developed and well-nourished. No distress.   HENT:  Mouth/Throat: Oropharynx is clear and moist. No oropharyngeal exudate.  Cardiovascular: Normal rate, regular rhythm and normal heart sounds. Exam reveals no gallop and no friction rub.  No murmur heard.  Pulmonary/Chest: Effort normal and breath sounds normal. No respiratory distress. He has no wheezes.  Abdominal: Soft. Bowel sounds are normal. He exhibits no distension. There is no tenderness.  Ext: left elbow tenderness ,swelling and erythema. Limited rom due to pain Neurological: He is alert and oriented to person, place, and time.  Skin: Skin is warm and dry. No rash noted. No erythema.  Psychiatric: He has a normal mood and affect. His behavior is normal.    LABS: Results for orders placed or performed during the hospital encounter of 10/21/23 (from the past 48 hours)  POC CBG, ED     Status: None   Collection Time: 10/21/23 10:08 AM  Result Value Ref Range   Glucose-Capillary 92 70 - 99 mg/dL    Comment: Glucose reference range applies only to samples taken after fasting for at least 8 hours.  CBC with Differential     Status: Abnormal   Collection Time: 10/21/23 11:27 AM  Result Value Ref Range   WBC 13.0 (H) 4.0 - 10.5 K/uL   RBC 3.78 (L) 4.22 - 5.81 MIL/uL   Hemoglobin 9.0 (L) 13.0 - 17.0 g/dL   HCT 44.0 (L) 34.7 - 42.5 %   MCV 79.1 (L) 80.0 - 100.0 fL   MCH 23.8 (L) 26.0 - 34.0 pg   MCHC 30.1 30.0 - 36.0 g/dL   RDW 95.6 (H) 38.7 - 56.4 %   Platelets 104 (L) 150 - 400 K/uL    Comment: Immature Platelet Fraction may be clinically indicated, consider ordering this additional test [id number]    nRBC 0.0 0.0 - 0.2 %   Neutrophils Relative % 50 %   Neutro Abs 6.5 1.7 - 7.7 K/uL   Lymphocytes Relative 3 %   Lymphs Abs 0.4 (L) 0.7 - 4.0 K/uL   Monocytes Relative 46 %   Monocytes Absolute 5.9 (H) 0.1 - 1.0 K/uL   Eosinophils Relative 0 %   Eosinophils Absolute 0.0 0.0 - 0.5 K/uL   Basophils Relative 0 %   Basophils Absolute 0.0  0.0 - 0.1 K/uL   Immature  Granulocytes 1 %   Abs Immature Granulocytes 0.10 (H) 0.00 - 0.07 K/uL    Comment: Performed at [facility], [address] [place], [place] [address]  Blood Cultures x 2 sites     Status: None (Preliminary result)   Collection Time: 10/21/23 11:27 AM   Specimen: Site Not Specified; Blood  Result Value Ref Range   Specimen Description      SITE NOT SPECIFIED Performed at [facility], [address] [place], [place] [address]    Special Requests      BOTTLES DRAWN AEROBIC AND ANAEROBIC Blood Culture adequate volume Performed at [facility], [address], [place], [place] [address]    Culture  Setup Time      GRAM POSITIVE COCCI IN CLUSTERS IN BOTH AEROBIC AND ANAEROBIC BOTTLES CRITICAL RESULT CALLED TO, READ BACK BY AND VERIFIED WITH: PHARMD G ABBOTT 10/22/2023 @ 0610 BY AB Performed at [facility], [address] [place], [place] [address]    Culture GRAM POSITIVE COCCI    Report Status PENDING   Sedimentation rate     Status: None   Collection Time: 10/21/23 11:27 AM  Result Value Ref Range   Sed Rate 12 0 - 16 mm/hr    Comment: Performed at [facility], [address] [place], [place] [address]  C-reactive protein     Status: Abnormal   Collection Time: 10/21/23 11:27 AM  Result Value Ref Range   CRP 9.3 (H) <1.0 mg/dL    Comment: Performed at [facility], [address] [place], [place] [address]  Blood Culture ID Panel (Reflexed)     Status: Abnormal   Collection Time: 10/21/23 11:27 AM  Result Value Ref Range   Enterococcus faecalis NOT DETECTED NOT DETECTED   Enterococcus Faecium NOT DETECTED NOT DETECTED   Listeria monocytogenes NOT DETECTED NOT DETECTED   Staphylococcus species DETECTED (A) NOT DETECTED    Comment: CRITICAL RESULT CALLED TO, READ BACK BY AND VERIFIED WITH: PHARMD G ABBOTT 10/22/2023 @ 0610 BY AB    Staphylococcus aureus (BCID) DETECTED (A) NOT  DETECTED    Comment: CRITICAL RESULT CALLED TO, READ BACK BY AND VERIFIED WITH: PHARMD G ABBOTT 10/22/2023 @ 0610 BY AB    Staphylococcus epidermidis NOT DETECTED NOT DETECTED   Staphylococcus lugdunensis NOT DETECTED NOT DETECTED   Streptococcus species NOT DETECTED NOT DETECTED   Streptococcus agalactiae NOT DETECTED NOT DETECTED   Streptococcus pneumoniae NOT DETECTED NOT DETECTED   Streptococcus pyogenes NOT DETECTED NOT DETECTED   A.calcoaceticus-baumannii NOT DETECTED NOT DETECTED   Bacteroides fragilis NOT DETECTED NOT DETECTED   Enterobacterales NOT DETECTED NOT DETECTED   Enterobacter cloacae complex NOT DETECTED NOT DETECTED   Escherichia coli NOT DETECTED NOT DETECTED   Klebsiella aerogenes NOT DETECTED NOT DETECTED   Klebsiella oxytoca NOT DETECTED NOT DETECTED   Klebsiella pneumoniae NOT DETECTED NOT DETECTED   Proteus species NOT DETECTED NOT DETECTED   Salmonella species NOT DETECTED NOT DETECTED   Serratia marcescens NOT DETECTED NOT DETECTED   Haemophilus influenzae NOT DETECTED NOT DETECTED   Neisseria meningitidis NOT DETECTED NOT DETECTED   Pseudomonas aeruginosa NOT DETECTED NOT DETECTED   Stenotrophomonas maltophilia NOT DETECTED NOT DETECTED   Candida albicans NOT DETECTED NOT DETECTED   Candida auris NOT DETECTED NOT DETECTED   Candida glabrata NOT DETECTED NOT DETECTED   Candida krusei NOT DETECTED NOT DETECTED  Candida parapsilosis NOT DETECTED NOT DETECTED   Candida tropicalis NOT DETECTED NOT DETECTED   Cryptococcus neoformans/gattii NOT DETECTED NOT DETECTED   Meth resistant mecA/C and MREJ NOT DETECTED NOT DETECTED    Comment: Performed at [facility], [address] [place], [place] [address]  I-Stat Lactic Acid, ED     Status: None   Collection Time: 10/21/23 11:39 AM  Result Value Ref Range   Lactic Acid, Venous 0.7 0.5 - 1.9 mmol/L  Blood Cultures x 2 sites     Status: None (Preliminary result)   Collection Time: 10/21/23 12:19 PM    Specimen: BLOOD  Result Value Ref Range   Specimen Description      BLOOD BLOOD RIGHT ARM Performed at [facility], [address], [place], [place] [address]    Special Requests      BOTTLES DRAWN AEROBIC AND ANAEROBIC Blood Culture adequate volume Performed at [facility], [address] [place], [place] [address]    Culture  Setup Time      GRAM POSITIVE COCCI IN CLUSTERS ANAEROBIC BOTTLE ONLY CRITICAL VALUE NOTED.  VALUE IS CONSISTENT WITH PREVIOUSLY REPORTED AND CALLED VALUE. Performed at [facility], [address] [place], [place] [address]    Culture GRAM POSITIVE COCCI    Report Status PENDING   Comprehensive metabolic panel     Status: Abnormal   Collection Time: 10/21/23  1:29 PM  Result Value Ref Range   Sodium 137 135 - 145 mmol/L   Potassium 3.1 (L) 3.5 - 5.1 mmol/L   Chloride 106 98 - 111 mmol/L   CO2 23 22 - 32 mmol/L   Glucose, Bld 85 70 - 99 mg/dL    Comment: Glucose reference range applies only to samples taken after fasting for at least 8 hours.   BUN 24 (H) 8 - 23 mg/dL   Creatinine, Ser 6.57 0.61 - 1.24 mg/dL   Calcium  8.6 (L) 8.9 - 10.3 mg/dL   Total Protein 6.0 (L) 6.5 - 8.1 g/dL   Albumin  3.0 (L) 3.5 - 5.0 g/dL   AST 16 15 - 41 U/L   ALT 11 0 - 44 U/L   Alkaline Phosphatase 60 38 - 126 U/L   Total Bilirubin 1.0 0.0 - 1.2 mg/dL   GFR, Estimated >84 >69 mL/min    Comment: (NOTE) Calculated using the CKD-EPI Creatinine Equation (2021)    Anion gap 8 5 - 15    Comment: Performed at [facility], [address], [place], [place] [address]  CBG monitoring, ED     Status: None   Collection Time: 10/21/23  1:48 PM  Result Value Ref Range   Glucose-Capillary 78 70 - 99 mg/dL    Comment: Glucose reference range applies only to samples taken after fasting for at least 8 hours.  Body fluid culture w Gram Stain     Status: None (Preliminary result)   Collection Time: 10/21/23  1:59 PM    Specimen: Synovium; Synovial Fluid  Result Value Ref Range   Specimen Description      SYNOVIAL Performed at [facility], [address], [place], [place] [address]    Special Requests      NONE LEFT ELBOW Performed at [facility], [address] [place], [place] [address]    Gram Stain      ABUNDANT WBC PRESENT,BOTH PMN AND MONONUCLEAR ABUNDANT GRAM POSITIVE COCCI    Culture  ABUNDANT STAPHYLOCOCCUS AUREUS SUSCEPTIBILITIES TO FOLLOW Performed at [facility], [address] [place], [place] [address]    Report Status PENDING   Gram stain     Status: None   Collection Time: 10/21/23  1:59 PM   Specimen: Synovium; Body Fluid  Result Value Ref Range   Specimen Description SYNOVIAL    Special Requests LEFT ELBOW    Gram Stain      ABUNDANT WBC SEEN GRAM POSITIVE COCCI Gram Stain Report Called to,Read Back By and Verified With: Alonna Art RN AT 1529 ON 10/21/2023 BY Anibal [place] Performed at [facility], [address] [place], [place] [address]    Report Status 10/21/2023 FINAL   Cell count + diff,  w/ cryst-synvl fld     Status: Abnormal   Collection Time: 10/21/23  1:59 PM  Result Value Ref Range   Color, Synovial RED (A) YELLOW   Appearance-Synovial TURBID (A) CLEAR   Crystals, Fluid INTRACELLULAR CALCIUM  PYROPHOSPHATE CRYSTALS    WBC, Synovial UNABLE TO PERFORM COUNT DUE TO CLOT IN SPECIMEN 0 - 200 /cu mm   Neutrophil, Synovial 58 (H) 0 - 25 %   Lymphocytes-Synovial Fld 16 0 - 20 %   Monocyte-Macrophage-Synovial Fluid 26 (L) 50 - 90 %   Other Cells-SYN      Intra and/or extracellular organisms present, correlate with Microbiology.    Comment: Performed at [facility], [address] [place], [place] [address]  Magnesium      Status: None   Collection Time: 10/21/23  4:12 PM  Result Value Ref Range   Magnesium  1.7 1.7 - 2.4 mg/dL    Comment: Performed at [facility], [address] [place], [place] [address]  Procalcitonin     Status: None   Collection Time: 10/21/23  4:12 PM  Result Value Ref Range   Procalcitonin 1.21 ng/mL    Comment:        Interpretation: PCT > 0.5 ng/mL and <= 2 ng/mL: Systemic infection (sepsis) is possible, but other conditions are known to elevate PCT as well. (NOTE)       Sepsis PCT Algorithm           Lower Respiratory Tract                                      Infection PCT Algorithm    ----------------------------     ----------------------------         PCT < 0.25 ng/mL                PCT < 0.10 ng/mL          Strongly encourage             Strongly discourage   discontinuation of antibiotics    initiation of antibiotics    ----------------------------     -----------------------------       PCT 0.25 - 0.50 ng/mL            PCT 0.10 - 0.25 ng/mL               OR       >80% decrease in PCT            Discourage initiation of  antibiotics      Encourage discontinuation           of antibiotics    ----------------------------     -----------------------------         PCT >= 0.50 ng/mL              PCT 0.26 - 0.50 ng/mL                AND       <80% decrease in PCT             Encourage initiation of                                             antibiotics       Encourage continuation           of antibiotics    ----------------------------     -----------------------------        PCT >= 0.50 ng/mL                  PCT > 0.50 ng/mL               AND         increase in PCT                  Strongly encourage                                      initiation of antibiotics    Strongly encourage escalation           of antibiotics                                     -----------------------------                                           PCT <= 0.25 ng/mL                                                 OR                                        > 80% decrease in  PCT                                      Discontinue / Do not initiate                                             antibiotics  Performed at [facility], [address], [place], [place] [address]   Uric acid     Status: None   Collection  Time: 10/21/23  6:05 PM  Result Value Ref Range   Uric Acid, Serum 4.6 3.7 - 8.6 mg/dL    Comment: Performed at [facility], [address], [place], [place] [address]  CBC     Status: Abnormal   Collection Time: 10/22/23  6:54 AM  Result Value Ref Range   WBC 21.4 (H) 4.0 - 10.5 K/uL   RBC 3.79 (L) 4.22 - 5.81 MIL/uL   Hemoglobin 9.1 (L) 13.0 - 17.0 g/dL   HCT 60.4 (L) 54.0 - 98.1 %   MCV 78.1 (L) 80.0 - 100.0 fL   MCH 24.0 (L) 26.0 - 34.0 pg   MCHC 30.7 30.0 - 36.0 g/dL   RDW 19.1 (H) 47.8 - 29.5 %   Platelets 90 (L) 150 - 400 K/uL    Comment: Immature Platelet Fraction may be clinically indicated, consider ordering this additional test [id number] REPEATED TO VERIFY    nRBC 0.0 0.0 - 0.2 %    Comment: Performed at [facility], [address] [place], [place] [address]  Basic metabolic panel with GFR     Status: Abnormal   Collection Time: 10/22/23 10:05 AM  Result Value Ref Range   Sodium 134 (L) 135 - 145 mmol/L   Potassium 3.3 (L) 3.5 - 5.1 mmol/L   Chloride 104 98 - 111 mmol/L   CO2 21 (L) 22 - 32 mmol/L   Glucose, Bld 175 (H) 70 - 99 mg/dL    Comment: Glucose reference range applies only to samples taken after fasting for at least 8 hours.   BUN 19 8 - 23 mg/dL   Creatinine, Ser 6.29 0.61 - 1.24 mg/dL   Calcium  8.3 (L) 8.9 - 10.3 mg/dL   GFR, Estimated >52 >84 mL/min    Comment: (NOTE) Calculated using the CKD-EPI Creatinine Equation (2021)    Anion gap 9 5 - 15    Comment: Performed at [facility], [address], [place], [place] [address]  Procalcitonin     Status: None   Collection Time: 10/22/23 10:05 AM  Result Value Ref Range   Procalcitonin 6.28 ng/mL    Comment:         Interpretation: PCT > 2 ng/mL: Systemic infection (sepsis) is likely, unless other causes are known. (NOTE)       Sepsis PCT Algorithm           Lower Respiratory Tract                                      Infection PCT Algorithm    ----------------------------     ----------------------------         PCT < 0.25 ng/mL                PCT < 0.10 ng/mL          Strongly encourage             Strongly discourage   discontinuation of antibiotics    initiation of antibiotics    ----------------------------     -----------------------------       PCT 0.25 - 0.50 ng/mL            PCT 0.10 - 0.25 ng/mL               OR       >80% decrease in PCT  Discourage initiation of                                            antibiotics      Encourage discontinuation           of antibiotics    ----------------------------     -----------------------------         PCT >= 0.50 ng/mL              PCT 0.26 - 0.50 ng/mL               AND       <80% decrease in PCT              Encourage initiation of                                             antibiotics       Encourage continuation           of antibiotics    ----------------------------     -----------------------------        PCT >= 0.50 ng/mL                  PCT > 0.50 ng/mL               AND         increase in PCT                  Strongly encourage                                      initiation of antibiotics    Strongly encourage escalation           of antibiotics                                     -----------------------------                                           PCT <= 0.25 ng/mL                                                 OR                                        > 80% decrease in PCT                                      Discontinue / Do not initiate  antibiotics  Performed at [facility], [address] [place], [place] [address]     MICRO: 4/20 blood cx 1  of 4 bolttes with MSSA 4/20 synovial fluid cx - MSSA IMAGING: DG Elbow Complete Left Result Date: 10/21/2023 CLINICAL DATA:  Swelling. Hot, red, mobile. Left elbow pain radiating down left arm. Left elbow surgery years ago. EXAM: LEFT ELBOW - COMPLETE 3+ VIEW COMPARISON:  Left elbow radiographs 12/20/2007 FINDINGS: Remote distal lateral and distal medial humeral plate and screw fracture fixation, and proximal posterior olecranon plate and screw fracture fixation, also seen on 12/20/2007 prior radiographs. Bone overlap limits evaluation, however there appears to be severe capitellum-radial head and trochlea-coronoid joint space narrowing. Subchondral sclerosis is seen within the proximal coronoid. There may be partial mid medial elbow arthrodesis. No acute fracture or dislocation.  No evidence of hardware failure. No definite elbow joint effusion. There is a linear 22 x less than 1 mm metallic possible needle within the soft tissues medial to the proximal diaphysis of the ulna, appearing within approximately 1.2 cm of the medial proximal forearm skin surface. IMPRESSION: 1. Remote distal humeral and proximal olecranon plate and screw fracture fixation. No evidence of hardware failure. 2. 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 3. There is a linear 22 x less than 1 mm metallic possible needle within the soft tissues medial to the proximal diaphysis of the ulna, appearing within approximately 1.2 cm of the medial proximal forearm skin surface. Recommend clinical correlation. Electronically Signed   By: Bertina Broccoli M.D.   On: 10/21/2023 12:15    HISTORICAL MICRO/IMAGING  Assessment/Plan:  83yo M with left elbow HW septic arthritis c/w MSSA infection with secondary MSSA bacteremia - continue on cefazolin  - will repeat blood cx tomorrow - please get TEE scheduled to rule out endocarditis - will follow up on OR cultures from surgery wash out and hw removal tomorrow  Leukocytosis  - anticipate to trend down once source control achieved  Thrombocytopenia = slightly above his baseline. For now does not need any platelet transfusion.  evaluation of this patient requires complex antimicrobial therapy evaluation and counseling and isolation needs for disease transmission risk assessment and mitigation.   I have personally spent 85 minutes involved in face-to-face and non-face-to-face activities for this patient on the day of the visit. Professional time spent includes the following activities: Preparing to see the patient (review of tests), Obtaining and/or reviewing separately obtained history (admission/discharge record), Performing a medically appropriate examination and/or evaluation , Ordering medications/tests/procedures, referring and communicating with other health care professionals, Documenting clinical information in the EMR, Independently interpreting results (not separately reported), Communicating results to the patient/family/caregiver, Counseling and educating the patient/family/caregiver and Care coordination (not separately reported).    Gerold Kos Levern Reader MD MPH [facility] [phone]

## 2023-10-22 NOTE — H&P (View-Only) (Signed)
 [facility]  Total days of antibiotics 2               Reason for Consult:MSSA bacteremia in the setting of left elbow HW septic arthritis    Referring Physician: Yvonne Hering, prosper  Principal Problem:   Acute on chronic pain of left elbow concerning for septic arthritis Active Problems:   RLS (restless legs syndrome)   Low back pain   BPH (benign prostatic hyperplasia)   HTN (hypertension)   Coronary artery disease   GERD (gastroesophageal reflux disease)   MDS (myelodysplastic syndrome) (HCC)   Hypokalemia    HPI: Justin Plata. is a 83 y.o. male with hx of BCC, MDS, who had remote surgery in 2007 to left elbow. He has had new onset of left elbow pain without trauma for which he describes as excruitating. He states that purulent fluid was aspirated in the ED from his elbow. He was evaluated by orthopedics, dr handy who did original surgery will be taking patient to the OR tomorrow for I x D, HW removal. Interestingly - plan films show possible needle in the soft tissue near ulna? Unclear if dislodged from original hw of plats and screw fixation from prior surgery. He has wbc of 21K, plt low at 90K. But in the past previously lower down to 30 in feb/jan 2025. He has 1 of 4 bottles positive for MSSA. Left elbow synovial fluid also found to have MSSA. He is currently on cefazolin .  He recently had TTE on 4/15 which did not comment on any vegetations.  Past Medical History:  Diagnosis Date   Alcoholism (HCC)    Dry 13 years   Basal cell carcinoma    Right Chest   BPH (benign prostatic hyperplasia)    Diverticulosis of colon    Dizzy spells    none recent   DJD (degenerative joint disease)    lower back   Elbow fracture, left 2009   Dr Guyann Leitz   GERD (gastroesophageal reflux disease)    Glaucoma    History of TIAs 1 yrs ago   Hyperkeratosis    LBP (low back pain)    Macular degeneration    left wet right dry sees dr Seward Dao   Melanoma  [facility]) 2009   x3 Dr Amy [place]   Osteoarthritis    Restless leg syndrome    Thrombocytopenia (HCC) 11/18/2021   Tinnitus    Tobacco abuse    Uses walker    Wears glasses    reading   Wears partial dentures    upper    Allergies:  Allergies  Allergen Reactions   Levofloxacin Other (See Comments)    REACTION: hands pealed    Current antibiotics:   MEDICATIONS:  amLODipine   5 mg Oral QHS   carbidopa -levodopa   1 tablet Oral QHS   cholecalciferol   3,000 Units Oral Daily   vitamin B-12  2,000 mcg Oral Daily   finasteride   5 mg Oral Daily   gabapentin   300 mg Oral TID   multivitamin  1 tablet Oral BID   oxyCODONE   10 mg Oral 5 X Daily   pravastatin   20 mg Oral Daily   [START ON 10/23/2023] predniSONE   5 mg Oral Q breakfast   rOPINIRole   1 mg Oral QID   tamsulosin   0.4 mg Oral q AM   terazosin   2 mg Oral QHS    Social History   Tobacco Use   Smoking status: Former  Current packs/day: 0.00    Average packs/day: 3.0 packs/day for 38.0 years (114.0 ttl pk-yrs)    Types: Cigarettes    Start date: 47    Quit date: 31    Years since quitting: 39.3   Smokeless tobacco: Never   Tobacco comments:    states he quit May 15 1985  Vaping Use   Vaping status: Never Used  Substance Use Topics   Alcohol  use: No    Comment: PREVIOUS - DRY 25yrs   Drug use: Not Currently    Family History  Problem Relation Age of Onset   Cancer Mother        ?breast   Cancer Father        Lung    Review of Systems -  Right elbow pain, with fevers. 12 point ros is negative  OBJECTIVE: Temp:  [97.5 F (36.4 C)-100.3 F (37.9 C)] 98.6 F (37 C) (04/21 0827) Pulse Rate:  [93-109] 99 (04/21 0827) Resp:  [18] 18 (04/21 0827) BP: (114-153)/(59-78) 114/59 (04/21 0827) SpO2:  [94 %-98 %] 94 % (04/21 0827) Weight:  [76.3 kg] 76.3 kg (04/20 1906) Physical Exam  Constitutional: He is oriented to person, place, and time. He appears well-developed and well-nourished. No distress.   HENT:  Mouth/Throat: Oropharynx is clear and moist. No oropharyngeal exudate.  Cardiovascular: Normal rate, regular rhythm and normal heart sounds. Exam reveals no gallop and no friction rub.  No murmur heard.  Pulmonary/Chest: Effort normal and breath sounds normal. No respiratory distress. He has no wheezes.  Abdominal: Soft. Bowel sounds are normal. He exhibits no distension. There is no tenderness.  Ext: left elbow tenderness ,swelling and erythema. Limited rom due to pain Neurological: He is alert and oriented to person, place, and time.  Skin: Skin is warm and dry. No rash noted. No erythema.  Psychiatric: He has a normal mood and affect. His behavior is normal.    LABS: Results for orders placed or performed during the hospital encounter of 10/21/23 (from the past 48 hours)  POC CBG, ED     Status: None   Collection Time: 10/21/23 10:08 AM  Result Value Ref Range   Glucose-Capillary 92 70 - 99 mg/dL    Comment: Glucose reference range applies only to samples taken after fasting for at least 8 hours.  CBC with Differential     Status: Abnormal   Collection Time: 10/21/23 11:27 AM  Result Value Ref Range   WBC 13.0 (H) 4.0 - 10.5 K/uL   RBC 3.78 (L) 4.22 - 5.81 MIL/uL   Hemoglobin 9.0 (L) 13.0 - 17.0 g/dL   HCT 44.0 (L) 34.7 - 42.5 %   MCV 79.1 (L) 80.0 - 100.0 fL   MCH 23.8 (L) 26.0 - 34.0 pg   MCHC 30.1 30.0 - 36.0 g/dL   RDW 95.6 (H) 38.7 - 56.4 %   Platelets 104 (L) 150 - 400 K/uL    Comment: Immature Platelet Fraction may be clinically indicated, consider ordering this additional test [id number]    nRBC 0.0 0.0 - 0.2 %   Neutrophils Relative % 50 %   Neutro Abs 6.5 1.7 - 7.7 K/uL   Lymphocytes Relative 3 %   Lymphs Abs 0.4 (L) 0.7 - 4.0 K/uL   Monocytes Relative 46 %   Monocytes Absolute 5.9 (H) 0.1 - 1.0 K/uL   Eosinophils Relative 0 %   Eosinophils Absolute 0.0 0.0 - 0.5 K/uL   Basophils Relative 0 %   Basophils Absolute 0.0  0.0 - 0.1 K/uL   Immature  Granulocytes 1 %   Abs Immature Granulocytes 0.10 (H) 0.00 - 0.07 K/uL    Comment: Performed at [facility], [address] [place], [place] [address]  Blood Cultures x 2 sites     Status: None (Preliminary result)   Collection Time: 10/21/23 11:27 AM   Specimen: Site Not Specified; Blood  Result Value Ref Range   Specimen Description      SITE NOT SPECIFIED Performed at [facility], [address] [place], [place] [address]    Special Requests      BOTTLES DRAWN AEROBIC AND ANAEROBIC Blood Culture adequate volume Performed at [facility], [address], [place], [place] [address]    Culture  Setup Time      GRAM POSITIVE COCCI IN CLUSTERS IN BOTH AEROBIC AND ANAEROBIC BOTTLES CRITICAL RESULT CALLED TO, READ BACK BY AND VERIFIED WITH: PHARMD G ABBOTT 10/22/2023 @ 0610 BY AB Performed at [facility], [address] [place], [place] [address]    Culture GRAM POSITIVE COCCI    Report Status PENDING   Sedimentation rate     Status: None   Collection Time: 10/21/23 11:27 AM  Result Value Ref Range   Sed Rate 12 0 - 16 mm/hr    Comment: Performed at [facility], [address] [place], [place] [address]  C-reactive protein     Status: Abnormal   Collection Time: 10/21/23 11:27 AM  Result Value Ref Range   CRP 9.3 (H) <1.0 mg/dL    Comment: Performed at [facility], [address] [place], [place] [address]  Blood Culture ID Panel (Reflexed)     Status: Abnormal   Collection Time: 10/21/23 11:27 AM  Result Value Ref Range   Enterococcus faecalis NOT DETECTED NOT DETECTED   Enterococcus Faecium NOT DETECTED NOT DETECTED   Listeria monocytogenes NOT DETECTED NOT DETECTED   Staphylococcus species DETECTED (A) NOT DETECTED    Comment: CRITICAL RESULT CALLED TO, READ BACK BY AND VERIFIED WITH: PHARMD G ABBOTT 10/22/2023 @ 0610 BY AB    Staphylococcus aureus (BCID) DETECTED (A) NOT  DETECTED    Comment: CRITICAL RESULT CALLED TO, READ BACK BY AND VERIFIED WITH: PHARMD G ABBOTT 10/22/2023 @ 0610 BY AB    Staphylococcus epidermidis NOT DETECTED NOT DETECTED   Staphylococcus lugdunensis NOT DETECTED NOT DETECTED   Streptococcus species NOT DETECTED NOT DETECTED   Streptococcus agalactiae NOT DETECTED NOT DETECTED   Streptococcus pneumoniae NOT DETECTED NOT DETECTED   Streptococcus pyogenes NOT DETECTED NOT DETECTED   A.calcoaceticus-baumannii NOT DETECTED NOT DETECTED   Bacteroides fragilis NOT DETECTED NOT DETECTED   Enterobacterales NOT DETECTED NOT DETECTED   Enterobacter cloacae complex NOT DETECTED NOT DETECTED   Escherichia coli NOT DETECTED NOT DETECTED   Klebsiella aerogenes NOT DETECTED NOT DETECTED   Klebsiella oxytoca NOT DETECTED NOT DETECTED   Klebsiella pneumoniae NOT DETECTED NOT DETECTED   Proteus species NOT DETECTED NOT DETECTED   Salmonella species NOT DETECTED NOT DETECTED   Serratia marcescens NOT DETECTED NOT DETECTED   Haemophilus influenzae NOT DETECTED NOT DETECTED   Neisseria meningitidis NOT DETECTED NOT DETECTED   Pseudomonas aeruginosa NOT DETECTED NOT DETECTED   Stenotrophomonas maltophilia NOT DETECTED NOT DETECTED   Candida albicans NOT DETECTED NOT DETECTED   Candida auris NOT DETECTED NOT DETECTED   Candida glabrata NOT DETECTED NOT DETECTED   Candida krusei NOT DETECTED NOT DETECTED  Candida parapsilosis NOT DETECTED NOT DETECTED   Candida tropicalis NOT DETECTED NOT DETECTED   Cryptococcus neoformans/gattii NOT DETECTED NOT DETECTED   Meth resistant mecA/C and MREJ NOT DETECTED NOT DETECTED    Comment: Performed at [facility], [address] [place], [place] [address]  I-Stat Lactic Acid, ED     Status: None   Collection Time: 10/21/23 11:39 AM  Result Value Ref Range   Lactic Acid, Venous 0.7 0.5 - 1.9 mmol/L  Blood Cultures x 2 sites     Status: None (Preliminary result)   Collection Time: 10/21/23 12:19 PM    Specimen: BLOOD  Result Value Ref Range   Specimen Description      BLOOD BLOOD RIGHT ARM Performed at [facility], [address], [place], [place] [address]    Special Requests      BOTTLES DRAWN AEROBIC AND ANAEROBIC Blood Culture adequate volume Performed at [facility], [address] [place], [place] [address]    Culture  Setup Time      GRAM POSITIVE COCCI IN CLUSTERS ANAEROBIC BOTTLE ONLY CRITICAL VALUE NOTED.  VALUE IS CONSISTENT WITH PREVIOUSLY REPORTED AND CALLED VALUE. Performed at [facility], [address] [place], [place] [address]    Culture GRAM POSITIVE COCCI    Report Status PENDING   Comprehensive metabolic panel     Status: Abnormal   Collection Time: 10/21/23  1:29 PM  Result Value Ref Range   Sodium 137 135 - 145 mmol/L   Potassium 3.1 (L) 3.5 - 5.1 mmol/L   Chloride 106 98 - 111 mmol/L   CO2 23 22 - 32 mmol/L   Glucose, Bld 85 70 - 99 mg/dL    Comment: Glucose reference range applies only to samples taken after fasting for at least 8 hours.   BUN 24 (H) 8 - 23 mg/dL   Creatinine, Ser 6.57 0.61 - 1.24 mg/dL   Calcium  8.6 (L) 8.9 - 10.3 mg/dL   Total Protein 6.0 (L) 6.5 - 8.1 g/dL   Albumin  3.0 (L) 3.5 - 5.0 g/dL   AST 16 15 - 41 U/L   ALT 11 0 - 44 U/L   Alkaline Phosphatase 60 38 - 126 U/L   Total Bilirubin 1.0 0.0 - 1.2 mg/dL   GFR, Estimated >84 >69 mL/min    Comment: (NOTE) Calculated using the CKD-EPI Creatinine Equation (2021)    Anion gap 8 5 - 15    Comment: Performed at [facility], [address], [place], [place] [address]  CBG monitoring, ED     Status: None   Collection Time: 10/21/23  1:48 PM  Result Value Ref Range   Glucose-Capillary 78 70 - 99 mg/dL    Comment: Glucose reference range applies only to samples taken after fasting for at least 8 hours.  Body fluid culture w Gram Stain     Status: None (Preliminary result)   Collection Time: 10/21/23  1:59 PM    Specimen: Synovium; Synovial Fluid  Result Value Ref Range   Specimen Description      SYNOVIAL Performed at [facility], [address], [place], [place] [address]    Special Requests      NONE LEFT ELBOW Performed at [facility], [address] [place], [place] [address]    Gram Stain      ABUNDANT WBC PRESENT,BOTH PMN AND MONONUCLEAR ABUNDANT GRAM POSITIVE COCCI    Culture  ABUNDANT STAPHYLOCOCCUS AUREUS SUSCEPTIBILITIES TO FOLLOW Performed at [facility], [address] [place], [place] [address]    Report Status PENDING   Gram stain     Status: None   Collection Time: 10/21/23  1:59 PM   Specimen: Synovium; Body Fluid  Result Value Ref Range   Specimen Description SYNOVIAL    Special Requests LEFT ELBOW    Gram Stain      ABUNDANT WBC SEEN GRAM POSITIVE COCCI Gram Stain Report Called to,Read Back By and Verified With: Alonna Art RN AT 1529 ON 10/21/2023 BY Anibal [place] Performed at [facility], [address] [place], [place] [address]    Report Status 10/21/2023 FINAL   Cell count + diff,  w/ cryst-synvl fld     Status: Abnormal   Collection Time: 10/21/23  1:59 PM  Result Value Ref Range   Color, Synovial RED (A) YELLOW   Appearance-Synovial TURBID (A) CLEAR   Crystals, Fluid INTRACELLULAR CALCIUM  PYROPHOSPHATE CRYSTALS    WBC, Synovial UNABLE TO PERFORM COUNT DUE TO CLOT IN SPECIMEN 0 - 200 /cu mm   Neutrophil, Synovial 58 (H) 0 - 25 %   Lymphocytes-Synovial Fld 16 0 - 20 %   Monocyte-Macrophage-Synovial Fluid 26 (L) 50 - 90 %   Other Cells-SYN      Intra and/or extracellular organisms present, correlate with Microbiology.    Comment: Performed at [facility], [address] [place], [place] [address]  Magnesium      Status: None   Collection Time: 10/21/23  4:12 PM  Result Value Ref Range   Magnesium  1.7 1.7 - 2.4 mg/dL    Comment: Performed at [facility], [address] [place], [place] [address]  Procalcitonin     Status: None   Collection Time: 10/21/23  4:12 PM  Result Value Ref Range   Procalcitonin 1.21 ng/mL    Comment:        Interpretation: PCT > 0.5 ng/mL and <= 2 ng/mL: Systemic infection (sepsis) is possible, but other conditions are known to elevate PCT as well. (NOTE)       Sepsis PCT Algorithm           Lower Respiratory Tract                                      Infection PCT Algorithm    ----------------------------     ----------------------------         PCT < 0.25 ng/mL                PCT < 0.10 ng/mL          Strongly encourage             Strongly discourage   discontinuation of antibiotics    initiation of antibiotics    ----------------------------     -----------------------------       PCT 0.25 - 0.50 ng/mL            PCT 0.10 - 0.25 ng/mL               OR       >80% decrease in PCT            Discourage initiation of  antibiotics      Encourage discontinuation           of antibiotics    ----------------------------     -----------------------------         PCT >= 0.50 ng/mL              PCT 0.26 - 0.50 ng/mL                AND       <80% decrease in PCT             Encourage initiation of                                             antibiotics       Encourage continuation           of antibiotics    ----------------------------     -----------------------------        PCT >= 0.50 ng/mL                  PCT > 0.50 ng/mL               AND         increase in PCT                  Strongly encourage                                      initiation of antibiotics    Strongly encourage escalation           of antibiotics                                     -----------------------------                                           PCT <= 0.25 ng/mL                                                 OR                                        > 80% decrease in  PCT                                      Discontinue / Do not initiate                                             antibiotics  Performed at [facility], [address], [place], [place] [address]   Uric acid     Status: None   Collection  Time: 10/21/23  6:05 PM  Result Value Ref Range   Uric Acid, Serum 4.6 3.7 - 8.6 mg/dL    Comment: Performed at [facility], [address], [place], [place] [address]  CBC     Status: Abnormal   Collection Time: 10/22/23  6:54 AM  Result Value Ref Range   WBC 21.4 (H) 4.0 - 10.5 K/uL   RBC 3.79 (L) 4.22 - 5.81 MIL/uL   Hemoglobin 9.1 (L) 13.0 - 17.0 g/dL   HCT 60.4 (L) 54.0 - 98.1 %   MCV 78.1 (L) 80.0 - 100.0 fL   MCH 24.0 (L) 26.0 - 34.0 pg   MCHC 30.7 30.0 - 36.0 g/dL   RDW 19.1 (H) 47.8 - 29.5 %   Platelets 90 (L) 150 - 400 K/uL    Comment: Immature Platelet Fraction may be clinically indicated, consider ordering this additional test [id number] REPEATED TO VERIFY    nRBC 0.0 0.0 - 0.2 %    Comment: Performed at [facility], [address] [place], [place] [address]  Basic metabolic panel with GFR     Status: Abnormal   Collection Time: 10/22/23 10:05 AM  Result Value Ref Range   Sodium 134 (L) 135 - 145 mmol/L   Potassium 3.3 (L) 3.5 - 5.1 mmol/L   Chloride 104 98 - 111 mmol/L   CO2 21 (L) 22 - 32 mmol/L   Glucose, Bld 175 (H) 70 - 99 mg/dL    Comment: Glucose reference range applies only to samples taken after fasting for at least 8 hours.   BUN 19 8 - 23 mg/dL   Creatinine, Ser 6.29 0.61 - 1.24 mg/dL   Calcium  8.3 (L) 8.9 - 10.3 mg/dL   GFR, Estimated >52 >84 mL/min    Comment: (NOTE) Calculated using the CKD-EPI Creatinine Equation (2021)    Anion gap 9 5 - 15    Comment: Performed at [facility], [address], [place], [place] [address]  Procalcitonin     Status: None   Collection Time: 10/22/23 10:05 AM  Result Value Ref Range   Procalcitonin 6.28 ng/mL    Comment:         Interpretation: PCT > 2 ng/mL: Systemic infection (sepsis) is likely, unless other causes are known. (NOTE)       Sepsis PCT Algorithm           Lower Respiratory Tract                                      Infection PCT Algorithm    ----------------------------     ----------------------------         PCT < 0.25 ng/mL                PCT < 0.10 ng/mL          Strongly encourage             Strongly discourage   discontinuation of antibiotics    initiation of antibiotics    ----------------------------     -----------------------------       PCT 0.25 - 0.50 ng/mL            PCT 0.10 - 0.25 ng/mL               OR       >80% decrease in PCT  Discourage initiation of                                            antibiotics      Encourage discontinuation           of antibiotics    ----------------------------     -----------------------------         PCT >= 0.50 ng/mL              PCT 0.26 - 0.50 ng/mL               AND       <80% decrease in PCT              Encourage initiation of                                             antibiotics       Encourage continuation           of antibiotics    ----------------------------     -----------------------------        PCT >= 0.50 ng/mL                  PCT > 0.50 ng/mL               AND         increase in PCT                  Strongly encourage                                      initiation of antibiotics    Strongly encourage escalation           of antibiotics                                     -----------------------------                                           PCT <= 0.25 ng/mL                                                 OR                                        > 80% decrease in PCT                                      Discontinue / Do not initiate  antibiotics  Performed at [facility], [address] [place], [place] [address]     MICRO: 4/20 blood cx 1  of 4 bolttes with MSSA 4/20 synovial fluid cx - MSSA IMAGING: DG Elbow Complete Left Result Date: 10/21/2023 CLINICAL DATA:  Swelling. Hot, red, mobile. Left elbow pain radiating down left arm. Left elbow surgery years ago. EXAM: LEFT ELBOW - COMPLETE 3+ VIEW COMPARISON:  Left elbow radiographs 12/20/2007 FINDINGS: Remote distal lateral and distal medial humeral plate and screw fracture fixation, and proximal posterior olecranon plate and screw fracture fixation, also seen on 12/20/2007 prior radiographs. Bone overlap limits evaluation, however there appears to be severe capitellum-radial head and trochlea-coronoid joint space narrowing. Subchondral sclerosis is seen within the proximal coronoid. There may be partial mid medial elbow arthrodesis. No acute fracture or dislocation.  No evidence of hardware failure. No definite elbow joint effusion. There is a linear 22 x less than 1 mm metallic possible needle within the soft tissues medial to the proximal diaphysis of the ulna, appearing within approximately 1.2 cm of the medial proximal forearm skin surface. IMPRESSION: 1. Remote distal humeral and proximal olecranon plate and screw fracture fixation. No evidence of hardware failure. 2. 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 3. There is a linear 22 x less than 1 mm metallic possible needle within the soft tissues medial to the proximal diaphysis of the ulna, appearing within approximately 1.2 cm of the medial proximal forearm skin surface. Recommend clinical correlation. Electronically Signed   By: Bertina Broccoli M.D.   On: 10/21/2023 12:15    HISTORICAL MICRO/IMAGING  Assessment/Plan:  83yo M with left elbow HW septic arthritis c/w MSSA infection with secondary MSSA bacteremia - continue on cefazolin  - will repeat blood cx tomorrow - please get TEE scheduled to rule out endocarditis - will follow up on OR cultures from surgery wash out and hw removal tomorrow  Leukocytosis  - anticipate to trend down once source control achieved  Thrombocytopenia = slightly above his baseline. For now does not need any platelet transfusion.  evaluation of this patient requires complex antimicrobial therapy evaluation and counseling and isolation needs for disease transmission risk assessment and mitigation.   I have personally spent 85 minutes involved in face-to-face and non-face-to-face activities for this patient on the day of the visit. Professional time spent includes the following activities: Preparing to see the patient (review of tests), Obtaining and/or reviewing separately obtained history (admission/discharge record), Performing a medically appropriate examination and/or evaluation , Ordering medications/tests/procedures, referring and communicating with other health care professionals, Documenting clinical information in the EMR, Independently interpreting results (not separately reported), Communicating results to the patient/family/caregiver, Counseling and educating the patient/family/caregiver and Care coordination (not separately reported).    Gerold Kos Levern Reader MD MPH [facility] [phone]

## 2023-10-22 NOTE — Progress Notes (Signed)
 Orthopedic Tech Progress Note Patient Details:  Justin [name]. July 14, 1940 [id number]  Ortho Devices Type of Ortho Device: Ace wrap, Cotton web roll, Long arm splint Ortho Device/Splint Location: LUE Ortho Device/Splint Interventions: Ordered, Application, Adjustment   Post Interventions Patient Tolerated: Well Instructions Provided: Care of device  Justin [name] 10/22/2023, 11:18 AM

## 2023-10-22 NOTE — Progress Notes (Signed)
 PROGRESS NOTE                                                                                                                                                                                                             Patient Demographics:    Justin [name], is a 83 y.o. male, DOB - [birth year], ZOX:[id number]  Outpatient Primary MD for the patient is Plotnikov, Oakley Bellman, MD    LOS - 1  Admit date - 10/21/2023    No chief complaint on file.      Brief Narrative (HPI from H&P)   Justin [name]. is a 83 y.o. male with medical history significant of HTN, CAD, BPH, hx of TIAs, DDD/chronic low back pain, MDS, thrombocytopenia, depression who presented to ED with complaints of acute on chronic left elbow pain. He started to have pain out of the ordinary last night around 8pm. He denies any trauma. This morning he called his daughter and she went over to his house. He was to weak and in too much pain to even get into the car so she called EMS. He can not move his left elbow at all. Pain 10/10. Pain radiates down his entire hand. He denies any fever, chilled at times. NO N/V. It is warm to touch, not really sure it's changed in color.    He has history of an open fracture in his left elbow in 2007 and had surgery. About 4 years ago one of the pins came lose and this was taken out. At baseline now he can not fully extend or flex to 90 degrees but has some movement in it. He can't used his hands at all. He states his hands are like a claw. He is right handed.    He has been feeling good. Denies any fever/chills, vision changes/headaches, chest pain or palpitations, shortness of breath or cough, abdominal pain, N/V/D, dysuria or leg swelling.    Denies any fever, vision changes/headaches, chest pain or palpitations, shortness of breath or cough, abdominal pain, N/V/D, dysuria or leg swelling.    He does not smoke or drink alcohol .     ER Course:  vitals: afebrile, bp: 133/69, HR; 78, RR: 16, oxygen: 94% Pertinent labs: WBC: 13, hgb: 9.0, platelets 104, potassium: 3.1,  Left elbow xray: Remote distal humeral and proximal olecranon plate and  screw fracture fixation. No evidence of hardware failure. 2. 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 3. There is a linear 22 x less than 1 mm metallic possible needle within the soft tissues medial to the proximal diaphysis of the ulna, appearing within approximately 1.2 cm of the medial proximal forearm skin surface. Recommend clinical correlation. In ED: given potassium, cefepime  and vanc and BC obtained. Lactic acid wnl. Fluid obtained from elbow. TRH asked to admit.    Subjective:    Lenton Gendreau was evaluated at the bed side. Evaluated laying comfortably in bed. Continues to report pain in his left elbow. States this was placed in a splint in the morning.  Aware of plan for I&D tomorrow.   Assessment  & Plan :    Assessment and Plan: # Septic arthritis of the elbow - Presented with left elbow pain with aspiration showing MSSA - Orthopedic surgery following, left elbow placed in a splint - Plan for I&D in the OR tomorrow - Continue IV Ancef  - ID following, appreciate recs - Pain control with oxycodone  and IV morphine   # MSSA bacteremia - Blood culture growing MSSA in 1 of 4 bottles - Likely from septic joint - Has had some low-grade fever, WBC 21K - ID following, plan to repeat blood cultures tomorrow - Will need TEE to rule out endocarditis   # Hypokalemia - K+ still low at 3.3 after repletion in the ER. Mag borderline low at 1.7. - Give KCl 40 mEq x 1, IV mag 2g - Follow-up morning BMP, mg, phos  # MDS (myelodysplastic syndrome) (HCC) # Thrombocytopenia - Followed by Dr. Rosaline Coma. Diagnosed 12/2021. Myelodysplastic Syndrome, Single Lineage Dysplasia - 12/19/2021: Bone marrow biopsy which showed a low-grade myelodysplastic  syndrome.  - Started retacrit  20,000 units q 2 weeks - Platelet of 90, above his baseline of 30s to 50s - Trend CBC  # RLS (restless legs syndrome) - Continue requip , gabapentin  and sinemet    # Chronic low back pain - Continue home oxycodone  10mg  5x/day.  - Continue steroid taper (2 more days)   # CAD - Lexiscan  stress test 2/19 was overall low risk with no ischemia identified.   - CT cardiac scoring 07/2018 showed coronary calcium  score of 41 (15th percentile)  - Continue pravastatin  and aspirin   # HTN (hypertension) - Continue norvasc , hytrin   # BPH (benign prostatic hyperplasia) - Continue flomax  and proscar    # GERD (gastroesophageal reflux disease) - On TUMs PRN        Condition -stable  Family Communication  : No family at bedside  Code Status : Full  Consults  : Orthopedic surgery, infectious disease  PUD Prophylaxis : None   Procedures  :     None      Disposition Plan  :    Status is: Inpatient Remains inpatient appropriate because: Septic arthritis, MSSA bacteremia  DVT Prophylaxis  :    SCDs Start: 10/21/23 1645     Lab Results  Component Value Date   PLT 90 (L) 10/22/2023    Diet :  Diet Order             Diet NPO time specified Except for: Sips with Meds  Diet effective midnight           Diet regular Fluid consistency: Thin  Diet effective now                    Inpatient Medications  Scheduled Meds:  amLODipine   5 mg  Oral QHS   carbidopa -levodopa   1 tablet Oral QHS   cholecalciferol   3,000 Units Oral Daily   vitamin B-12  2,000 mcg Oral Daily   finasteride   5 mg Oral Daily   gabapentin   300 mg Oral TID   multivitamin  1 tablet Oral BID   oxyCODONE   10 mg Oral 5 X Daily   pravastatin   20 mg Oral Daily   [START ON 10/23/2023] predniSONE   5 mg Oral Q breakfast   rOPINIRole   1 mg Oral QID   tamsulosin   0.4 mg Oral q AM   terazosin   2 mg Oral QHS   Continuous Infusions:   ceFAZolin  (ANCEF ) IV     PRN  Meds:.acetaminophen  **OR** acetaminophen , calcium  carbonate, carbidopa -levodopa  **AND** carbidopa -levodopa , magnesium  hydroxide, morphine  injection, ondansetron  **OR** ondansetron  (ZOFRAN ) IV, polyvinyl alcohol   Antibiotics  :    Anti-infectives (From admission, onward)    Start     Dose/Rate Route Frequency Ordered Stop   10/22/23 1700  vancomycin  (VANCOREADY) IVPB 1250 mg/250 mL  Status:  Discontinued        1,250 mg 166.7 mL/hr over 90 Minutes Intravenous Every 24 hours 10/21/23 1914 10/22/23 0619   10/22/23 0715  ceFAZolin  (ANCEF ) IVPB 2g/100 mL premix        2 g 200 mL/hr over 30 Minutes Intravenous Every 8 hours 10/22/23 0619     10/21/23 2000  cefTRIAXone  (ROCEPHIN ) 2 g in sodium chloride  0.9 % 100 mL IVPB  Status:  Discontinued        2 g 200 mL/hr over 30 Minutes Intravenous Every 24 hours 10/21/23 1907 10/22/23 0619   10/21/23 1530  vancomycin  (VANCOREADY) IVPB 1500 mg/300 mL        1,500 mg 150 mL/hr over 120 Minutes Intravenous  Once 10/21/23 1519 10/21/23 1818   10/21/23 1530  ceFEPIme  (MAXIPIME ) 2 g in sodium chloride  0.9 % 100 mL IVPB        2 g 200 mL/hr over 30 Minutes Intravenous  Once 10/21/23 1519 10/21/23 1649         Objective:   Vitals:   10/21/23 1752 10/21/23 1906 10/21/23 2054 10/22/23 0542  BP: 136/70  135/68 (!) 148/78  Pulse: (!) 109  (!) 109 93  Resp:   18 18  Temp: (!) 97.5 F (36.4 C)  100.3 F (37.9 C) 99.2 F (37.3 C)  TempSrc: Oral  Oral Oral  SpO2: 98%  96% 95%  Weight:  76.3 kg    Height:  5\' 9"  (1.753 m)      Wt Readings from Last 3 Encounters:  10/21/23 76.3 kg  09/11/23 73.7 kg  09/04/23 73.5 kg     Intake/Output Summary (Last 24 hours) at 10/22/2023 9811 Last data filed at 10/22/2023 0407 Gross per 24 hour  Intake 99.11 ml  Output --  Net 99.11 ml     Physical Exam  General: Pleasant, well-appearing elderly man laying in bed. No acute distress. HEENT: [place]/AT. Anicteric sclera CV: RRR. No murmurs, rubs, or gallops.  No LE edema Pulmonary: Lungs CTAB. Normal effort. No wheezing or rales. Abdominal: Soft, NT/ND. Normal bowel sounds. MSK: Moderate ttp of the L elbow. Left elbow in a splint. Limited ROM due to pain. Neurovascularly intact distally. Skin: Warm and dry. No obvious rash or lesions. Neuro: A&Ox3. Moves all extremities. Normal sensation to light touch. No focal deficit. Psych: Normal mood and affect    RN pressure injury documentation:      Data Review:  Recent Labs  Lab 10/21/23 1127 10/22/23 0654  WBC 13.0* 21.4*  HGB 9.0* 9.1*  HCT 29.9* 29.6*  PLT 104* 90*  MCV 79.1* 78.1*  MCH 23.8* 24.0*  MCHC 30.1 30.7  RDW 17.7* 18.3*  LYMPHSABS 0.4*  --   MONOABS 5.9*  --   EOSABS 0.0  --   BASOSABS 0.0  --     Recent Labs  Lab 10/21/23 1127 10/21/23 1139 10/21/23 1329 10/21/23 1612  NA  --   --  137  --   K  --   --  3.1*  --   CL  --   --  106  --   CO2  --   --  23  --   ANIONGAP  --   --  8  --   GLUCOSE  --   --  85  --   BUN  --   --  24*  --   CREATININE  --   --  1.01  --   AST  --   --  16  --   ALT  --   --  11  --   ALKPHOS  --   --  60  --   BILITOT  --   --  1.0  --   ALBUMIN   --   --  3.0*  --   CRP 9.3*  --   --   --   PROCALCITON  --   --   --  1.21  LATICACIDVEN  --  0.7  --   --   MG  --   --   --  1.7  CALCIUM   --   --  8.6*  --       Recent Labs  Lab 10/21/23 1127 10/21/23 1139 10/21/23 1329 10/21/23 1612  CRP 9.3*  --   --   --   PROCALCITON  --   --   --  1.21  LATICACIDVEN  --  0.7  --   --   MG  --   --   --  1.7  CALCIUM   --   --  8.6*  --     --------------------------------------------------------------------------------------------------------------- Lab Results  Component Value Date   CHOL 136 01/12/2017   HDL 54.70 01/12/2017   LDLCALC 68 01/12/2017   TRIG 67.0 01/12/2017   CHOLHDL 2 01/12/2017    Lab Results  Component Value Date   HGBA1C 5.3 04/19/2021   No results for input(s): "TSH", "T4TOTAL", "FREET4",  "T3FREE", "THYROIDAB" in the last 72 hours. No results for input(s): "VITAMINB12", "FOLATE", "FERRITIN", "TIBC", "IRON", "RETICCTPCT" in the last 72 hours. ------------------------------------------------------------------------------------------------------------------ Cardiac Enzymes No results for input(s): "CKMB", "TROPONINI", "MYOGLOBIN" in the last 168 hours.  Invalid input(s): "CK"  Micro Results Recent Results (from the past 240 hours)  Blood Cultures x 2 sites     Status: None (Preliminary result)   Collection Time: 10/21/23 11:27 AM   Specimen: Site Not Specified; Blood  Result Value Ref Range Status   Specimen Description   Final    SITE NOT SPECIFIED Performed at [facility], [address] [place], [place] [address]    Special Requests   Final    BOTTLES DRAWN AEROBIC AND ANAEROBIC Blood Culture adequate volume Performed at [facility], [address] [place], [place] [address]    Culture  Setup Time   Final    GRAM POSITIVE COCCI IN CLUSTERS IN BOTH AEROBIC AND ANAEROBIC BOTTLES CRITICAL  RESULT CALLED TO, READ BACK BY AND VERIFIED WITH: PHARMD G ABBOTT 10/22/2023 @ 0610 BY AB Performed at [facility], [address] [place], [place] [address]    Culture GRAM POSITIVE COCCI  Final   Report Status PENDING  Incomplete  Blood Culture ID Panel (Reflexed)     Status: Abnormal   Collection Time: 10/21/23 11:27 AM  Result Value Ref Range Status   Enterococcus faecalis NOT DETECTED NOT DETECTED Final   Enterococcus Faecium NOT DETECTED NOT DETECTED Final   Listeria monocytogenes NOT DETECTED NOT DETECTED Final   Staphylococcus species DETECTED (A) NOT DETECTED Final    Comment: CRITICAL RESULT CALLED TO, READ BACK BY AND VERIFIED WITH: PHARMD G ABBOTT 10/22/2023 @ 0610 BY AB    Staphylococcus aureus (BCID) DETECTED (A) NOT DETECTED Final    Comment: CRITICAL RESULT CALLED TO, READ BACK BY AND VERIFIED WITH: PHARMD G  ABBOTT 10/22/2023 @ 0610 BY AB    Staphylococcus epidermidis NOT DETECTED NOT DETECTED Final   Staphylococcus lugdunensis NOT DETECTED NOT DETECTED Final   Streptococcus species NOT DETECTED NOT DETECTED Final   Streptococcus agalactiae NOT DETECTED NOT DETECTED Final   Streptococcus pneumoniae NOT DETECTED NOT DETECTED Final   Streptococcus pyogenes NOT DETECTED NOT DETECTED Final   A.calcoaceticus-baumannii NOT DETECTED NOT DETECTED Final   Bacteroides fragilis NOT DETECTED NOT DETECTED Final   Enterobacterales NOT DETECTED NOT DETECTED Final   Enterobacter cloacae complex NOT DETECTED NOT DETECTED Final   Escherichia coli NOT DETECTED NOT DETECTED Final   Klebsiella aerogenes NOT DETECTED NOT DETECTED Final   Klebsiella oxytoca NOT DETECTED NOT DETECTED Final   Klebsiella pneumoniae NOT DETECTED NOT DETECTED Final   Proteus species NOT DETECTED NOT DETECTED Final   Salmonella species NOT DETECTED NOT DETECTED Final   Serratia marcescens NOT DETECTED NOT DETECTED Final   Haemophilus influenzae NOT DETECTED NOT DETECTED Final   Neisseria meningitidis NOT DETECTED NOT DETECTED Final   Pseudomonas aeruginosa NOT DETECTED NOT DETECTED Final   Stenotrophomonas maltophilia NOT DETECTED NOT DETECTED Final   Candida albicans NOT DETECTED NOT DETECTED Final   Candida auris NOT DETECTED NOT DETECTED Final   Candida glabrata NOT DETECTED NOT DETECTED Final   Candida krusei NOT DETECTED NOT DETECTED Final   Candida parapsilosis NOT DETECTED NOT DETECTED Final   Candida tropicalis NOT DETECTED NOT DETECTED Final   Cryptococcus neoformans/gattii NOT DETECTED NOT DETECTED Final   Meth resistant mecA/C and MREJ NOT DETECTED NOT DETECTED Final    Comment: Performed at [facility], [address], [place], [place] [address]  Blood Cultures x 2 sites     Status: None (Preliminary result)   Collection Time: 10/21/23 12:19 PM   Specimen: BLOOD  Result Value Ref Range Status   Specimen  Description   Final    BLOOD BLOOD RIGHT ARM Performed at [facility], [address] [place], [place] [address]    Special Requests   Final    BOTTLES DRAWN AEROBIC AND ANAEROBIC Blood Culture adequate volume Performed at [facility], [address], [place], [place] [address]    Culture  Setup Time   Final    GRAM POSITIVE COCCI IN CLUSTERS ANAEROBIC BOTTLE ONLY CRITICAL VALUE NOTED.  VALUE IS CONSISTENT WITH PREVIOUSLY REPORTED AND CALLED VALUE. Performed at [facility], [address], [place], [place] [address]    Culture [facility] POSITIVE COCCI  Final   Report Status PENDING  Incomplete  Gram stain     Status: None   Collection Time: 10/21/23  1:59 PM   Specimen: Synovium; Body Fluid  Result Value Ref Range Status   Specimen Description SYNOVIAL  Final   Special Requests LEFT ELBOW  Final   Gram Stain   Final    ABUNDANT WBC SEEN GRAM POSITIVE COCCI Gram Stain Report Called to,Read Back By and Verified With: Alonna Art RN AT 1529 ON 10/21/2023 BY Anibal [place] Performed at [facility], [address], [place], [place] [address]    Report Status 10/21/2023 FINAL  Final    Radiology Reports DG Elbow Complete Left Result Date: 10/21/2023 CLINICAL DATA:  Swelling. Hot, red, mobile. Left elbow pain radiating down left arm. Left elbow surgery years ago. EXAM: LEFT ELBOW - COMPLETE 3+ VIEW COMPARISON:  Left elbow radiographs 12/20/2007 FINDINGS: Remote distal lateral and distal medial humeral plate and screw fracture fixation, and proximal posterior olecranon plate and screw fracture fixation, also seen on 12/20/2007 prior radiographs. Bone overlap limits evaluation, however there appears to be severe capitellum-radial head and trochlea-coronoid joint space narrowing. Subchondral sclerosis is seen within the proximal coronoid. There may be partial mid medial elbow arthrodesis. No acute fracture or dislocation.  No evidence of  hardware failure. No definite elbow joint effusion. There is a linear 22 x less than 1 mm metallic possible needle within the soft tissues medial to the proximal diaphysis of the ulna, appearing within approximately 1.2 cm of the medial proximal forearm skin surface. IMPRESSION: 1. Remote distal humeral and proximal olecranon plate and screw fracture fixation. No evidence of hardware failure. 2. 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 3. There is a linear 22 x less than 1 mm metallic possible needle within the soft tissues medial to the proximal diaphysis of the ulna, appearing within approximately 1.2 cm of the medial proximal forearm skin surface. Recommend clinical correlation. Electronically Signed   By: Bertina Broccoli M.D.   On: 10/21/2023 12:15      Signature  -   Vita Grip M.D on 10/22/2023 at 8:08 AM   -  To page go to [email or web address]

## 2023-10-23 ENCOUNTER — Encounter (HOSPITAL_COMMUNITY)
Admission: EM | Disposition: A | Discharge status: Home Health | Payer: Self-pay | Source: Home / Self Care | Attending: Internal Medicine

## 2023-10-23 ENCOUNTER — Other Ambulatory Visit: Payer: Self-pay

## 2023-10-23 ENCOUNTER — Inpatient Hospital Stay (HOSPITAL_COMMUNITY): Admitting: Anesthesiology

## 2023-10-23 ENCOUNTER — Inpatient Hospital Stay (HOSPITAL_COMMUNITY)

## 2023-10-23 ENCOUNTER — Encounter (HOSPITAL_COMMUNITY): Payer: Self-pay | Admitting: Family Medicine

## 2023-10-23 DIAGNOSIS — R7881 Bacteremia: Secondary | ICD-10-CM | POA: Diagnosis not present

## 2023-10-23 DIAGNOSIS — M009 Pyogenic arthritis, unspecified: Secondary | ICD-10-CM | POA: Diagnosis not present

## 2023-10-23 DIAGNOSIS — M25522 Pain in left elbow: Secondary | ICD-10-CM | POA: Diagnosis not present

## 2023-10-23 DIAGNOSIS — Z472 Encounter for removal of internal fixation device: Secondary | ICD-10-CM | POA: Diagnosis not present

## 2023-10-23 DIAGNOSIS — B9561 Methicillin susceptible Staphylococcus aureus infection as the cause of diseases classified elsewhere: Secondary | ICD-10-CM | POA: Diagnosis not present

## 2023-10-23 HISTORY — PX: HARDWARE REMOVAL: SHX979

## 2023-10-23 HISTORY — PX: IRRIGATION AND DEBRIDEMENT ELBOW: SHX6886

## 2023-10-23 LAB — BASIC METABOLIC PANEL WITH GFR
Anion gap: 10 (ref 5–15)
BUN: 26 mg/dL — ABNORMAL HIGH (ref 8–23)
CO2: 21 mmol/L — ABNORMAL LOW (ref 22–32)
Calcium: 8.2 mg/dL — ABNORMAL LOW (ref 8.9–10.3)
Chloride: 101 mmol/L (ref 98–111)
Creatinine, Ser: 1.3 mg/dL — ABNORMAL HIGH (ref 0.61–1.24)
GFR, Estimated: 54 mL/min — ABNORMAL LOW (ref >60)
Glucose, Bld: 112 mg/dL — ABNORMAL HIGH (ref 70–99)
Potassium: 3.6 mmol/L (ref 3.5–5.1)
Sodium: 132 mmol/L — ABNORMAL LOW (ref 135–145)

## 2023-10-23 LAB — CBC
HCT: 28.4 % — ABNORMAL LOW (ref 39.0–52.0)
Hemoglobin: 8.7 g/dL — ABNORMAL LOW (ref 13.0–17.0)
MCH: 23.5 pg — ABNORMAL LOW (ref 26.0–34.0)
MCHC: 30.6 g/dL (ref 30.0–36.0)
MCV: 76.8 fL — ABNORMAL LOW (ref 80.0–100.0)
Platelets: 80 10*3/uL — ABNORMAL LOW (ref 150–400)
RBC: 3.7 MIL/uL — ABNORMAL LOW (ref 4.22–5.81)
RDW: 18.2 % — ABNORMAL HIGH (ref 11.5–15.5)
WBC: 15.3 10*3/uL — ABNORMAL HIGH (ref 4.0–10.5)
nRBC: 0 % (ref 0.0–0.2)

## 2023-10-23 LAB — BODY FLUID CULTURE W GRAM STAIN

## 2023-10-23 LAB — MAGNESIUM: Magnesium: 2.5 mg/dL — ABNORMAL HIGH (ref 1.7–2.4)

## 2023-10-23 LAB — PHOSPHORUS: Phosphorus: 2.3 mg/dL — ABNORMAL LOW (ref 2.5–4.6)

## 2023-10-23 LAB — PATHOLOGIST SMEAR REVIEW

## 2023-10-23 SURGERY — IRRIGATION AND DEBRIDEMENT ELBOW
Anesthesia: General | Site: Elbow | Laterality: Left

## 2023-10-23 MED ORDER — HYDROMORPHONE HCL 1 MG/ML IJ SOLN
0.5000 mg | Freq: Four times a day (QID) | INTRAMUSCULAR | Status: DC | PRN
  Administered 2023-10-23: 0.5 mg via INTRAVENOUS
  Filled 2023-10-23: qty 0.5

## 2023-10-23 MED ORDER — HYDROMORPHONE HCL 1 MG/ML IJ SOLN: 0.5000 mg | INTRAMUSCULAR | Status: DC | PRN

## 2023-10-23 MED ORDER — ORAL CARE MOUTH RINSE: 15.0000 mL | Freq: Once | OROMUCOSAL | Status: AC

## 2023-10-23 MED ORDER — SUGAMMADEX SODIUM 200 MG/2ML IV SOLN
INTRAVENOUS | Status: DC | PRN
Start: 2023-10-23 — End: 2023-10-23
  Administered 2023-10-23: 200 mg via INTRAVENOUS

## 2023-10-23 MED ORDER — FENTANYL CITRATE (PF) 250 MCG/5ML IJ SOLN
INTRAMUSCULAR | Status: AC
  Filled 2023-10-23: qty 5

## 2023-10-23 MED ORDER — PROPOFOL 10 MG/ML IV BOLUS
INTRAVENOUS | Status: AC
  Filled 2023-10-23: qty 20

## 2023-10-23 MED ORDER — ONDANSETRON HCL 4 MG/2ML IJ SOLN
INTRAMUSCULAR | Status: DC | PRN
Start: 2023-10-23 — End: 2023-10-23
  Administered 2023-10-23: 4 mg via INTRAVENOUS

## 2023-10-23 MED ORDER — DEXMEDETOMIDINE HCL IN NACL 80 MCG/20ML IV SOLN
INTRAVENOUS | Status: AC
  Filled 2023-10-23: qty 20

## 2023-10-23 MED ORDER — SODIUM PHOSPHATES 45 MMOLE/15ML IV SOLN
30.0000 mmol | Freq: Once | INTRAVENOUS | Status: DC
  Filled 2023-10-23: qty 10

## 2023-10-23 MED ORDER — LIDOCAINE 2% (20 MG/ML) 5 ML SYRINGE
INTRAMUSCULAR | Status: AC
  Filled 2023-10-23: qty 5

## 2023-10-23 MED ORDER — TRANEXAMIC ACID-NACL 1000-0.7 MG/100ML-% IV SOLN
INTRAVENOUS | Status: DC | PRN
  Administered 2023-10-23: 1000 mg via INTRAVENOUS

## 2023-10-23 MED ORDER — CHLORHEXIDINE GLUCONATE 0.12 % MT SOLN: 15.0000 mL | Freq: Once | OROMUCOSAL | Status: AC

## 2023-10-23 MED ORDER — ACETAMINOPHEN 500 MG PO TABS
1000.0000 mg | ORAL_TABLET | Freq: Once | ORAL | Status: AC
  Administered 2023-10-23: 1000 mg via ORAL
  Filled 2023-10-23: qty 2

## 2023-10-23 MED ORDER — DEXAMETHASONE SODIUM PHOSPHATE 10 MG/ML IJ SOLN
INTRAMUSCULAR | Status: AC
  Filled 2023-10-23: qty 1

## 2023-10-23 MED ORDER — DEXAMETHASONE SODIUM PHOSPHATE 10 MG/ML IJ SOLN
INTRAMUSCULAR | Status: DC | PRN
Start: 2023-10-23 — End: 2023-10-23
  Administered 2023-10-23: 8 mg via INTRAVENOUS

## 2023-10-23 MED ORDER — LACTATED RINGERS IV SOLN: INTRAVENOUS | Status: DC

## 2023-10-23 MED ORDER — ROCURONIUM BROMIDE 10 MG/ML (PF) SYRINGE
PREFILLED_SYRINGE | INTRAVENOUS | Status: AC
  Filled 2023-10-23: qty 10

## 2023-10-23 MED ORDER — CEFAZOLIN SODIUM-DEXTROSE 2-3 GM-%(50ML) IV SOLR
INTRAVENOUS | Status: DC | PRN
  Administered 2023-10-23: 2 g via INTRAVENOUS

## 2023-10-23 MED ORDER — AMISULPRIDE (ANTIEMETIC) 5 MG/2ML IV SOLN: 10.0000 mg | Freq: Once | INTRAVENOUS | Status: DC | PRN

## 2023-10-23 MED ORDER — ONDANSETRON HCL 4 MG/2ML IJ SOLN
INTRAMUSCULAR | Status: AC
  Filled 2023-10-23: qty 2

## 2023-10-23 MED ORDER — FENTANYL CITRATE (PF) 250 MCG/5ML IJ SOLN
INTRAMUSCULAR | Status: DC | PRN
  Administered 2023-10-23 (×2): 50 ug via INTRAVENOUS

## 2023-10-23 MED ORDER — PHENYLEPHRINE 80 MCG/ML (10ML) SYRINGE FOR IV PUSH (FOR BLOOD PRESSURE SUPPORT)
PREFILLED_SYRINGE | INTRAVENOUS | Status: DC | PRN
  Administered 2023-10-23: 80 ug via INTRAVENOUS

## 2023-10-23 MED ORDER — SODIUM CHLORIDE 0.9 % IV BOLUS: 1000.0000 mL | Freq: Once | INTRAVENOUS | Status: DC

## 2023-10-23 MED ORDER — FENTANYL CITRATE (PF) 100 MCG/2ML IJ SOLN
25.0000 ug | INTRAMUSCULAR | Status: DC | PRN
  Administered 2023-10-23: 25 ug via INTRAVENOUS
  Administered 2023-10-23: 50 ug via INTRAVENOUS

## 2023-10-23 MED ORDER — VASHE WOUND IRRIGATION OPTIME
TOPICAL | Status: DC | PRN
  Administered 2023-10-23: 34 [oz_av]

## 2023-10-23 MED ORDER — ROCURONIUM BROMIDE 10 MG/ML (PF) SYRINGE
PREFILLED_SYRINGE | INTRAVENOUS | Status: DC | PRN
  Administered 2023-10-23: 50 mg via INTRAVENOUS

## 2023-10-23 MED ORDER — ONDANSETRON HCL 4 MG/2ML IJ SOLN: 4.0000 mg | Freq: Once | INTRAMUSCULAR | Status: DC | PRN

## 2023-10-23 MED ORDER — PHENYLEPHRINE 80 MCG/ML (10ML) SYRINGE FOR IV PUSH (FOR BLOOD PRESSURE SUPPORT)
PREFILLED_SYRINGE | INTRAVENOUS | Status: AC
  Filled 2023-10-23: qty 20

## 2023-10-23 MED ORDER — OXYCODONE HCL 5 MG/5ML PO SOLN: 5.0000 mg | Freq: Once | ORAL | Status: DC | PRN

## 2023-10-23 MED ORDER — ACETAMINOPHEN 650 MG RE SUPP
650.0000 mg | Freq: Three times a day (TID) | RECTAL | Status: DC
  Filled 2023-10-23: qty 1

## 2023-10-23 MED ORDER — PHENYLEPHRINE HCL-NACL 20-0.9 MG/250ML-% IV SOLN
INTRAVENOUS | Status: DC | PRN
  Administered 2023-10-23: 10 ug/min via INTRAVENOUS

## 2023-10-23 MED ORDER — PROPOFOL 10 MG/ML IV BOLUS
INTRAVENOUS | Status: DC | PRN
  Administered 2023-10-23: 100 mg via INTRAVENOUS

## 2023-10-23 MED ORDER — ACETAMINOPHEN 325 MG PO TABS
650.0000 mg | ORAL_TABLET | Freq: Three times a day (TID) | ORAL | Status: DC
  Administered 2023-10-23 – 2023-10-26 (×9): 650 mg via ORAL
  Filled 2023-10-23 (×9): qty 2

## 2023-10-23 MED ORDER — FENTANYL CITRATE (PF) 100 MCG/2ML IJ SOLN
INTRAMUSCULAR | Status: AC
  Filled 2023-10-23: qty 2

## 2023-10-23 MED ORDER — LIDOCAINE 2% (20 MG/ML) 5 ML SYRINGE
INTRAMUSCULAR | Status: DC | PRN
  Administered 2023-10-23: 60 mg via INTRAVENOUS

## 2023-10-23 MED ORDER — 0.9 % SODIUM CHLORIDE (POUR BTL) OPTIME
TOPICAL | Status: DC | PRN
  Administered 2023-10-23 (×3): 1000 mL

## 2023-10-23 MED ORDER — OXYCODONE HCL 5 MG PO TABS: 5.0000 mg | ORAL_TABLET | Freq: Once | ORAL | Status: DC | PRN

## 2023-10-23 MED ORDER — TRANEXAMIC ACID-NACL 1000-0.7 MG/100ML-% IV SOLN
INTRAVENOUS | Status: AC
Start: 2023-10-23 — End: ?
  Filled 2023-10-23: qty 100

## 2023-10-23 MED ORDER — CHLORHEXIDINE GLUCONATE 0.12 % MT SOLN
OROMUCOSAL | Status: AC
  Administered 2023-10-23: 15 mL via OROMUCOSAL
  Filled 2023-10-23: qty 15

## 2023-10-23 SURGICAL SUPPLY — 55 items
BAG COUNTER SPONGE SURGICOUNT (BAG) ×1 IMPLANT
BANDAGE ESMARK 6X9 LF (GAUZE/BANDAGES/DRESSINGS) ×1 IMPLANT
BNDG COHESIVE 6X5 TAN ST LF (GAUZE/BANDAGES/DRESSINGS) ×1 IMPLANT
BNDG ELASTIC 4INX 5YD STR LF (GAUZE/BANDAGES/DRESSINGS) IMPLANT
BNDG ELASTIC 4X5.8 VLCR STR LF (GAUZE/BANDAGES/DRESSINGS) ×1 IMPLANT
BNDG ELASTIC 6INX 5YD STR LF (GAUZE/BANDAGES/DRESSINGS) ×1 IMPLANT
BNDG GAUZE DERMACEA FLUFF 4 (GAUZE/BANDAGES/DRESSINGS) ×2 IMPLANT
BRUSH SCRUB EZ PLAIN DRY (MISCELLANEOUS) ×2 IMPLANT
CLEANSER WND VASHE 34 (WOUND CARE) IMPLANT
COVER SURGICAL LIGHT HANDLE (MISCELLANEOUS) ×2 IMPLANT
CUFF TOURN SGL QUICK 18X4 (TOURNIQUET CUFF) IMPLANT
CUFF TRNQT CYL 24X4X16.5-23 (TOURNIQUET CUFF) IMPLANT
CUFF TRNQT CYL 34X4.125X (TOURNIQUET CUFF) IMPLANT
DRAPE C-ARM 42X72 X-RAY (DRAPES) IMPLANT
DRAPE C-ARMOR (DRAPES) ×1 IMPLANT
DRAPE U-SHAPE 47X51 STRL (DRAPES) ×1 IMPLANT
DRSG ADAPTIC 3X8 NADH LF (GAUZE/BANDAGES/DRESSINGS) ×1 IMPLANT
ELECTRODE REM PT RTRN 9FT ADLT (ELECTROSURGICAL) ×1 IMPLANT
GAUZE PAD ABD 8X10 STRL (GAUZE/BANDAGES/DRESSINGS) IMPLANT
GAUZE SPONGE 4X4 12PLY STRL (GAUZE/BANDAGES/DRESSINGS) ×1 IMPLANT
GLOVE BIO SURGEON STRL SZ7.5 (GLOVE) ×1 IMPLANT
GLOVE BIO SURGEON STRL SZ8 (GLOVE) ×1 IMPLANT
GLOVE BIOGEL PI IND STRL 7.5 (GLOVE) ×1 IMPLANT
GLOVE BIOGEL PI IND STRL 8 (GLOVE) ×1 IMPLANT
GLOVE SURG ORTHO LTX SZ7.5 (GLOVE) ×2 IMPLANT
GOWN STRL REUS W/ TWL LRG LVL3 (GOWN DISPOSABLE) ×2 IMPLANT
GOWN STRL REUS W/ TWL XL LVL3 (GOWN DISPOSABLE) ×1 IMPLANT
KIT BASIN OR (CUSTOM PROCEDURE TRAY) ×1 IMPLANT
KIT TURNOVER KIT B (KITS) ×1 IMPLANT
MANIFOLD NEPTUNE II (INSTRUMENTS) ×1 IMPLANT
NDL 22X1.5 STRL (OR ONLY) (MISCELLANEOUS) IMPLANT
NEEDLE 22X1.5 STRL (OR ONLY) (MISCELLANEOUS) IMPLANT
NS IRRIG 1000ML POUR BTL (IV SOLUTION) ×1 IMPLANT
PACK ORTHO EXTREMITY (CUSTOM PROCEDURE TRAY) ×1 IMPLANT
PAD ARMBOARD POSITIONER FOAM (MISCELLANEOUS) ×2 IMPLANT
PADDING CAST COTTON 6X4 STRL (CAST SUPPLIES) ×3 IMPLANT
SPONGE T-LAP 18X18 ~~LOC~~+RFID (SPONGE) ×1 IMPLANT
STAPLER VISISTAT 35W (STAPLE) IMPLANT
STOCKINETTE IMPERVIOUS LG (DRAPES) ×1 IMPLANT
STRIP CLOSURE SKIN 1/2X4 (GAUZE/BANDAGES/DRESSINGS) IMPLANT
SUCTION TUBE FRAZIER 10FR DISP (SUCTIONS) IMPLANT
SUT ETHILON 2 0 FS 18 (SUTURE) IMPLANT
SUT ETHILON 2 0 PSLX (SUTURE) IMPLANT
SUT PDS AB 1 CT1 36 (SUTURE) IMPLANT
SUT PDS AB 2-0 CT1 27 (SUTURE) IMPLANT
SUT PDS AB 2-0 CT2 27 (SUTURE) IMPLANT
SUT VIC AB 0 CT1 27XBRD ANBCTR (SUTURE) IMPLANT
SUT VIC AB 2-0 CT1 TAPERPNT 27 (SUTURE) IMPLANT
SYR CONTROL 10ML LL (SYRINGE) IMPLANT
TOWEL GREEN STERILE (TOWEL DISPOSABLE) ×2 IMPLANT
TOWEL GREEN STERILE FF (TOWEL DISPOSABLE) ×2 IMPLANT
TUBE CONNECTING 12X1/4 (SUCTIONS) ×1 IMPLANT
UNDERPAD 30X36 HEAVY ABSORB (UNDERPADS AND DIAPERS) ×1 IMPLANT
WATER STERILE IRR 1000ML POUR (IV SOLUTION) ×2 IMPLANT
YANKAUER SUCT BULB TIP NO VENT (SUCTIONS) ×1 IMPLANT

## 2023-10-23 NOTE — Progress Notes (Signed)
 PROGRESS NOTE                                                                                                                                                                                                             Patient Demographics:    Justin [name], is a 83 y.o. male, DOB - 1940-12-21, ZOX:[id number]  Outpatient Primary MD for the patient is Plotnikov, Oakley Bellman, MD    LOS - 2  Admit date - 10/21/2023    No chief complaint on file.      Brief Narrative (HPI from H&P)   Justin [name]. is a 83 y.o. male with medical history significant of HTN, CAD, BPH, hx of TIAs, DDD/chronic low back pain, MDS, thrombocytopenia, depression who presented to ED with complaints of acute on chronic left elbow pain. He started to have pain out of the ordinary last night around 8pm. He denies any trauma. This morning he called his daughter and she went over to his house. He was to weak and in too much pain to even get into the car so she called EMS. He can not move his left elbow at all. Pain 10/10. Pain radiates down his entire hand. He denies any fever, chilled at times. NO N/V. It is warm to touch, not really sure it's changed in color.    He has history of an open fracture in his left elbow in 2007 and had surgery. About 4 years ago one of the pins came lose and this was taken out. At baseline now he can not fully extend or flex to 90 degrees but has some movement in it. He can't used his hands at all. He states his hands are like a claw. He is right handed.    He has been feeling good. Denies any fever/chills, vision changes/headaches, chest pain or palpitations, shortness of breath or cough, abdominal pain, N/V/D, dysuria or leg swelling.    Denies any fever, vision changes/headaches, chest pain or palpitations, shortness of breath or cough, abdominal pain, N/V/D, dysuria or leg swelling.    He does not smoke or drink alcohol .     ER Course:  vitals: afebrile, bp: 133/69, HR; 78, RR: 16, oxygen: 94% Pertinent labs: WBC: 13, hgb: 9.0, platelets 104, potassium: 3.1,  Left elbow xray: Remote distal humeral and proximal olecranon plate and  screw fracture fixation. No evidence of hardware failure. 2. 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 3. There is a linear 22 x less than 1 mm metallic possible needle within the soft tissues medial to the proximal diaphysis of the ulna, appearing within approximately 1.2 cm of the medial proximal forearm skin surface. Recommend clinical correlation. In ED: given potassium, cefepime  and vanc and BC obtained. Lactic acid wnl. Fluid obtained from elbow. TRH asked to admit.    Subjective:    Justin [name] was evaluated at the bed side. Evaluated laying comfortably in bed after surgery. States he was informed that he got all the infection out. Continues to have pain in his left elbow down his left arm. He is currently waiting for his dentist.   Assessment  & Plan :    Assessment and Plan: # Septic arthritis of the elbow - Presented with left elbow pain with aspiration showing MSSA - Orthopedic surgery following, status post I&D in the OR this morning - Left elbow still tender, covered with dressing - Gram stain from surgical tissue already growing gram-positive cocci - Follow-up synovial fluid culture from I&D - Continue IV Ancef  - ID following, appreciate recs - Pain control with oxycodone  and IV morphine   # MSSA bacteremia - Blood culture growing MSSA in 1 of 4 bottles - Very likely likely from septic joint - Leukocytosis trending down, he remains afebrile - ID following, appreciate recs - Cardiology consulted for TEE, will try to get patient on schedule tomorrow - N.p.o. at midnight   # Hypokalemia, resolved - K+ 3.6, follow-up morning BMP  # MDS (myelodysplastic syndrome) (HCC) # Thrombocytopenia - Followed by Dr. Rosaline Coma. Diagnosed  12/2021. Myelodysplastic Syndrome, Single Lineage Dysplasia - 12/19/2021: Bone marrow biopsy which showed a low-grade myelodysplastic syndrome.  - Started retacrit  20,000 units q 2 weeks - Platelet of 80, above his baseline of 30s to 50s - Trend CBC  # RLS (restless legs syndrome) - Continue requip , gabapentin  and sinemet    # Chronic low back pain - Continue home oxycodone  10mg  5x/day.  - Continue steroid taper (1 more days)   # CAD - Lexiscan  stress test 2/19 was overall low risk with no ischemia identified.   - CT cardiac scoring 07/2018 showed coronary calcium  score of 41 (15th percentile)  - Continue pravastatin  and aspirin   # HTN (hypertension) - Continue norvasc , hytrin   # BPH (benign prostatic hyperplasia) - Continue flomax  and proscar    # GERD (gastroesophageal reflux disease) - On TUMs PRN        Condition -stable  Family Communication  : No family at bedside  Code Status : Full  Consults  : Orthopedic surgery, infectious disease, cardiology  PUD Prophylaxis : None   Procedures  :     None      Disposition Plan  :    Status is: Inpatient Remains inpatient appropriate because: Septic arthritis, MSSA bacteremia  DVT Prophylaxis  :    SCDs Start: 10/21/23 1645     Lab Results  Component Value Date   PLT 80 (L) 10/23/2023    Diet :  Diet Order             Diet NPO time specified Except for: Sips with Meds  Diet effective midnight           Diet regular Fluid consistency: Thin  Diet effective now                    Inpatient Medications  Scheduled Meds:  amLODipine   5 mg Oral QHS   carbidopa -levodopa   1 tablet Oral QHS   cholecalciferol   3,000 Units Oral Daily   vitamin B-12  2,000 mcg Oral Daily   finasteride   5 mg Oral Daily   gabapentin   300 mg Oral TID   multivitamin  1 tablet Oral BID   oxyCODONE   10 mg Oral 5 X Daily   pravastatin   20 mg Oral Daily   predniSONE   5 mg Oral Q breakfast   rOPINIRole   1 mg Oral QID    tamsulosin   0.4 mg Oral q AM   terazosin   2 mg Oral QHS   Continuous Infusions:   ceFAZolin  (ANCEF ) IV 2 g (10/23/23 0639)   PRN Meds:.acetaminophen  **OR** acetaminophen , calcium  carbonate, carbidopa -levodopa  **AND** carbidopa -levodopa , HYDROmorphone  (DILAUDID ) injection, magnesium  hydroxide, ondansetron  **OR** ondansetron  (ZOFRAN ) IV, polyvinyl alcohol   Antibiotics  :    Anti-infectives (From admission, onward)    Start     Dose/Rate Route Frequency Ordered Stop   10/22/23 1700  vancomycin  (VANCOREADY) IVPB 1250 mg/250 mL  Status:  Discontinued        1,250 mg 166.7 mL/hr over 90 Minutes Intravenous Every 24 hours 10/21/23 1914 10/22/23 0619   10/22/23 0715  ceFAZolin  (ANCEF ) IVPB 2g/100 mL premix        2 g 200 mL/hr over 30 Minutes Intravenous Every 8 hours 10/22/23 0619     10/21/23 2000  cefTRIAXone  (ROCEPHIN ) 2 g in sodium chloride  0.9 % 100 mL IVPB  Status:  Discontinued        2 g 200 mL/hr over 30 Minutes Intravenous Every 24 hours 10/21/23 1907 10/22/23 0619   10/21/23 1530  vancomycin  (VANCOREADY) IVPB 1500 mg/300 mL        1,500 mg 150 mL/hr over 120 Minutes Intravenous  Once 10/21/23 1519 10/21/23 1818   10/21/23 1530  ceFEPIme  (MAXIPIME ) 2 g in sodium chloride  0.9 % 100 mL IVPB        2 g 200 mL/hr over 30 Minutes Intravenous  Once 10/21/23 1519 10/21/23 1649         Objective:   Vitals:   10/22/23 0542 10/22/23 0827 10/23/23 0429 10/23/23 0736  BP: (!) 148/78 (!) 114/59 128/60 130/65  Pulse: 93 99 90 85  Resp: 18 18 19 17   Temp: 99.2 F (37.3 C) 98.6 F (37 C) 99.1 F (37.3 C) 99.7 F (37.6 C)  TempSrc: Oral Oral Oral Oral  SpO2: 95% 94% 90% 91%  Weight:      Height:        Wt Readings from Last 3 Encounters:  10/21/23 76.3 kg  09/11/23 73.7 kg  09/04/23 73.5 kg     Intake/Output Summary (Last 24 hours) at 10/23/2023 1610 Last data filed at 10/23/2023 0400 Gross per 24 hour  Intake 0 ml  Output --  Net 0 ml     Physical Exam  General:  Pleasant, well-appearing elderly man laying in bed. No acute distress. HEENT: [place]/AT. Anicteric sclera CV: RRR. No murmurs, rubs, or gallops. No LE edema Pulmonary: Lungs CTAB. Normal effort. No wheezing or rales. Abdominal: Soft, NT/ND. Normal bowel sounds. MSK: Moderate ttp of the L elbow. Left elbow covered with dressing. Limited ROM of the left arm and elbow due to pain. Neurovascularly intact distally. Skin: Warm and dry. No obvious rash or lesions. Neuro: A&Ox3. Moves all extremities. Normal sensation to light touch. No focal deficit. Psych: Normal mood and affect    RN pressure injury documentation:  Data Review:    Recent Labs  Lab 10/21/23 1127 10/22/23 0654 10/23/23 0452  WBC 13.0* 21.4* 15.3*  HGB 9.0* 9.1* 8.7*  HCT 29.9* 29.6* 28.4*  PLT 104* 90* 80*  MCV 79.1* 78.1* 76.8*  MCH 23.8* 24.0* 23.5*  MCHC 30.1 30.7 30.6  RDW 17.7* 18.3* 18.2*  LYMPHSABS 0.4*  --   --   MONOABS 5.9*  --   --   EOSABS 0.0  --   --   BASOSABS 0.0  --   --     Recent Labs  Lab 10/21/23 1127 10/21/23 1139 10/21/23 1329 10/21/23 1612 10/22/23 1005 10/23/23 0452  NA  --   --  137  --  134* 132*  K  --   --  3.1*  --  3.3* 3.6  CL  --   --  106  --  104 101  CO2  --   --  23  --  21* 21*  ANIONGAP  --   --  8  --  9 10  GLUCOSE  --   --  85  --  175* 112*  BUN  --   --  24*  --  19 26*  CREATININE  --   --  1.01  --  1.15 1.30*  AST  --   --  16  --   --   --   ALT  --   --  11  --   --   --   ALKPHOS  --   --  60  --   --   --   BILITOT  --   --  1.0  --   --   --   ALBUMIN   --   --  3.0*  --   --   --   CRP 9.3*  --   --   --   --   --   PROCALCITON  --   --   --  1.21 6.28  --   LATICACIDVEN  --  0.7  --   --   --   --   MG  --   --   --  1.7  --  2.5*  CALCIUM   --   --  8.6*  --  8.3* 8.2*      Recent Labs  Lab 10/21/23 1127 10/21/23 1139 10/21/23 1329 10/21/23 1612 10/22/23 1005 10/23/23 0452  CRP 9.3*  --   --   --   --   --   PROCALCITON  --   --    --  1.21 6.28  --   LATICACIDVEN  --  0.7  --   --   --   --   MG  --   --   --  1.7  --  2.5*  CALCIUM   --   --  8.6*  --  8.3* 8.2*    --------------------------------------------------------------------------------------------------------------- Lab Results  Component Value Date   CHOL 136 01/12/2017   HDL 54.70 01/12/2017   LDLCALC 68 01/12/2017   TRIG 67.0 01/12/2017   CHOLHDL 2 01/12/2017    Lab Results  Component Value Date   HGBA1C 5.3 04/19/2021   No results for input(s): "TSH", "T4TOTAL", "FREET4", "T3FREE", "THYROIDAB" in the last 72 hours. No results for input(s): "VITAMINB12", "FOLATE", "FERRITIN", "TIBC", "IRON", "RETICCTPCT" in the last 72 hours. ------------------------------------------------------------------------------------------------------------------ Cardiac Enzymes No results for input(s): "CKMB", "TROPONINI", "MYOGLOBIN" in the last 168 hours.  Invalid input(s): "CK"  Micro Results  Recent Results (from the past 240 hours)  Blood Cultures x 2 sites     Status: None (Preliminary result)   Collection Time: 10/21/23 11:27 AM   Specimen: Site Not Specified; Blood  Result Value Ref Range Status   Specimen Description   Final    SITE NOT SPECIFIED Performed at [facility], [address], [place], [place] [address]    Special Requests   Final    BOTTLES DRAWN AEROBIC AND ANAEROBIC Blood Culture adequate volume Performed at [facility], [address], [place], [place] [address]    Culture  Setup Time   Final    GRAM POSITIVE COCCI IN CLUSTERS IN BOTH AEROBIC AND ANAEROBIC BOTTLES CRITICAL RESULT CALLED TO, READ BACK BY AND VERIFIED WITH: PHARMD G ABBOTT 10/22/2023 @ 0610 BY AB Performed at [facility], [address], [place], [place] [address]    Culture GRAM POSITIVE COCCI  Final   Report Status PENDING  Incomplete  Blood Culture ID Panel (Reflexed)     Status: Abnormal   Collection Time: 10/21/23  11:27 AM  Result Value Ref Range Status   Enterococcus faecalis NOT DETECTED NOT DETECTED Final   Enterococcus Faecium NOT DETECTED NOT DETECTED Final   Listeria monocytogenes NOT DETECTED NOT DETECTED Final   Staphylococcus species DETECTED (A) NOT DETECTED Final    Comment: CRITICAL RESULT CALLED TO, READ BACK BY AND VERIFIED WITH: PHARMD G ABBOTT 10/22/2023 @ 0610 BY AB    Staphylococcus aureus (BCID) DETECTED (A) NOT DETECTED Final    Comment: CRITICAL RESULT CALLED TO, READ BACK BY AND VERIFIED WITH: PHARMD G ABBOTT 10/22/2023 @ 0610 BY AB    Staphylococcus epidermidis NOT DETECTED NOT DETECTED Final   Staphylococcus lugdunensis NOT DETECTED NOT DETECTED Final   Streptococcus species NOT DETECTED NOT DETECTED Final   Streptococcus agalactiae NOT DETECTED NOT DETECTED Final   Streptococcus pneumoniae NOT DETECTED NOT DETECTED Final   Streptococcus pyogenes NOT DETECTED NOT DETECTED Final   A.calcoaceticus-baumannii NOT DETECTED NOT DETECTED Final   Bacteroides fragilis NOT DETECTED NOT DETECTED Final   Enterobacterales NOT DETECTED NOT DETECTED Final   Enterobacter cloacae complex NOT DETECTED NOT DETECTED Final   Escherichia coli NOT DETECTED NOT DETECTED Final   Klebsiella aerogenes NOT DETECTED NOT DETECTED Final   Klebsiella oxytoca NOT DETECTED NOT DETECTED Final   Klebsiella pneumoniae NOT DETECTED NOT DETECTED Final   Proteus species NOT DETECTED NOT DETECTED Final   Salmonella species NOT DETECTED NOT DETECTED Final   Serratia marcescens NOT DETECTED NOT DETECTED Final   Haemophilus influenzae NOT DETECTED NOT DETECTED Final   Neisseria meningitidis NOT DETECTED NOT DETECTED Final   Pseudomonas aeruginosa NOT DETECTED NOT DETECTED Final   Stenotrophomonas maltophilia NOT DETECTED NOT DETECTED Final   Candida albicans NOT DETECTED NOT DETECTED Final   Candida auris NOT DETECTED NOT DETECTED Final   Candida glabrata NOT DETECTED NOT DETECTED Final   Candida krusei NOT  DETECTED NOT DETECTED Final   Candida parapsilosis NOT DETECTED NOT DETECTED Final   Candida tropicalis NOT DETECTED NOT DETECTED Final   Cryptococcus neoformans/gattii NOT DETECTED NOT DETECTED Final   Meth resistant mecA/C and MREJ NOT DETECTED NOT DETECTED Final    Comment: Performed at [facility], [address] [place], [place] [address]  Blood Cultures x 2 sites     Status: None (Preliminary result)   Collection Time: 10/21/23 12:19 PM   Specimen: BLOOD  Result Value Ref Range  Status   Specimen Description   Final    BLOOD BLOOD RIGHT ARM Performed at [facility], [address], [place], [place] [address]    Special Requests   Final    BOTTLES DRAWN AEROBIC AND ANAEROBIC Blood Culture adequate volume Performed at [facility], [address], [place], [place] [address]    Culture  Setup Time   Final    GRAM POSITIVE COCCI IN CLUSTERS ANAEROBIC BOTTLE ONLY CRITICAL VALUE NOTED.  VALUE IS CONSISTENT WITH PREVIOUSLY REPORTED AND CALLED VALUE. Performed at [facility], [address] [place], [place] [address]    Culture GRAM POSITIVE COCCI  Final   Report Status PENDING  Incomplete  Body fluid culture w Gram Stain     Status: None (Preliminary result)   Collection Time: 10/21/23  1:59 PM   Specimen: Synovium; Synovial Fluid  Result Value Ref Range Status   Specimen Description   Final    SYNOVIAL Performed at [facility], [address], [place], [place] [address]    Special Requests   Final    NONE LEFT ELBOW Performed at [facility], [address], [place], [place] [address]    Gram Stain   Final    ABUNDANT WBC PRESENT,BOTH PMN AND MONONUCLEAR ABUNDANT GRAM POSITIVE COCCI    Culture   Final    ABUNDANT STAPHYLOCOCCUS AUREUS SUSCEPTIBILITIES TO FOLLOW Performed at [facility], [address], [place], [place] [address]    Report Status PENDING  Incomplete  Gram  stain     Status: None   Collection Time: 10/21/23  1:59 PM   Specimen: Synovium; Body Fluid  Result Value Ref Range Status   Specimen Description SYNOVIAL  Final   Special Requests LEFT ELBOW  Final   Gram Stain   Final    ABUNDANT WBC SEEN GRAM POSITIVE COCCI Gram Stain Report Called to,Read Back By and Verified With: Alonna Art RN AT 1529 ON 10/21/2023 BY Anibal Kent Performed at [facility], [address] [place], [place] [address]    Report Status 10/21/2023 FINAL  Final    Radiology Reports DG Elbow Complete Left Result Date: 10/21/2023 CLINICAL DATA:  Swelling. Hot, red, mobile. Left elbow pain radiating down left arm. Left elbow surgery years ago. EXAM: LEFT ELBOW - COMPLETE 3+ VIEW COMPARISON:  Left elbow radiographs 12/20/2007 FINDINGS: Remote distal lateral and distal medial humeral plate and screw fracture fixation, and proximal posterior olecranon plate and screw fracture fixation, also seen on 12/20/2007 prior radiographs. Bone overlap limits evaluation, however there appears to be severe capitellum-radial head and trochlea-coronoid joint space narrowing. Subchondral sclerosis is seen within the proximal coronoid. There may be partial mid medial elbow arthrodesis. No acute fracture or dislocation.  No evidence of hardware failure. No definite elbow joint effusion. There is a linear 22 x less than 1 mm metallic possible needle within the soft tissues medial to the proximal diaphysis of the ulna, appearing within approximately 1.2 cm of the medial proximal forearm skin surface. IMPRESSION: 1. Remote distal humeral and proximal olecranon plate and screw fracture fixation. No evidence of hardware failure. 2. 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 3. There is a linear 22 x less than 1 mm metallic possible needle within the soft tissues medial to the proximal diaphysis of the ulna, appearing within approximately 1.2 cm of the medial proximal  forearm skin surface. Recommend clinical correlation. Electronically Signed  By: Bertina Broccoli M.D.   On: 10/21/2023 12:15      Signature  -   Vita Grip M.D on 10/23/2023 at 8:33 AM   -  To page go to [email or web address]

## 2023-10-23 NOTE — Progress Notes (Signed)
 Orthopedic Tech Progress Note Patient Details:  Justin [name]. Nov 01, 1940 [id number]  Ortho Devices Type of Ortho Device: Shoulder immobilizer Ortho Device/Splint Location: LUE Ortho Device/Splint Interventions: Ordered, Application, Adjustment   Post Interventions Patient Tolerated: Well Instructions Provided: Care of device, Adjustment of device  Herbie Loll 10/23/2023, 10:40 PM

## 2023-10-23 NOTE — Anesthesia Preprocedure Evaluation (Addendum)
 Anesthesia Evaluation  Patient identified by MRN, date of birth, ID band Patient awake    Reviewed: Allergy & Precautions, NPO status , Patient's Chart, lab work & pertinent test results  Airway Mallampati: III  TM Distance: >3 FB Neck ROM: Full    Dental  (+) Dental Advisory Given, Edentulous Upper, Edentulous Lower   Pulmonary former smoker 114 pack year history    Pulmonary exam normal breath sounds clear to auscultation       Cardiovascular hypertension (138/77 preop), Pt. on medications Normal cardiovascular exam+ Valvular Problems/Murmurs (mild MR) MR  Rhythm:Regular Rate:Normal  Echo 10/16/23  1. Left ventricular ejection fraction, by estimation, is 55 to 60%. The  left ventricle has normal function. The left ventricle has no regional  wall motion abnormalities. Left ventricular diastolic parameters were  normal. The average left ventricular  global longitudinal strain is -18.7 %. The global longitudinal strain is  normal.   2. Right ventricular systolic function is normal. The right ventricular  size is normal.   3. Left atrial size was moderately dilated.   4. The mitral valve is abnormal. Mild mitral valve regurgitation. No  evidence of mitral stenosis.   5. The aortic valve is tricuspid. There is mild calcification of the  aortic valve. There is mild thickening of the aortic valve. Aortic valve  regurgitation is trivial. Aortic valve sclerosis is present, with no  evidence of aortic valve stenosis.   6. The inferior vena cava is dilated in size with >50% respiratory  variability, suggesting right atrial pressure of 8 mmHg.     Neuro/Psych negative neurological ROS  negative psych ROS   GI/Hepatic ,GERD  Controlled,,(+)     substance abuse (remote hx etoh abuse)  alcohol  use  Endo/Other  negative endocrine ROS    Renal/GU Renal InsufficiencyRenal diseaseCr 1.3  negative genitourinary    Musculoskeletal  (+) Arthritis , Osteoarthritis,  Oxy 10mg    Abdominal   Peds  Hematology negative hematology ROS (+)   Anesthesia Other Findings Septic L elbow  Reproductive/Obstetrics negative OB ROS                             Anesthesia Physical Anesthesia Plan  ASA: 2  Anesthesia Plan: General   Post-op Pain Management: Tylenol  PO (pre-op)*   Induction: Intravenous  PONV Risk Score and Plan: 2 and Ondansetron , Dexamethasone  and Treatment may vary due to age or medical condition  Airway Management Planned: Oral ETT  Additional Equipment: None  Intra-op Plan:   Post-operative Plan: Extubation in OR  Informed Consent: I have reviewed the patients History and Physical, chart, labs and discussed the procedure including the risks, benefits and alternatives for the proposed anesthesia with the patient or authorized representative who has indicated his/her understanding and acceptance.     Dental advisory given and Consent reviewed with POA  Plan Discussed with: CRNA  Anesthesia Plan Comments:         Anesthesia Quick Evaluation

## 2023-10-23 NOTE — Progress Notes (Signed)
 Attempted to get report from floor nurse x 2. No answer. Transport sent for patient.

## 2023-10-23 NOTE — Anesthesia Postprocedure Evaluation (Signed)
 Anesthesia Post Note  Patient: Adyen Bifulco [facility].  Procedure(s) Performed: IRRIGATION AND DEBRIDEMENT ELBOW (Left: Elbow) REMOVAL, HARDWARE (Left: Elbow)     Patient location during evaluation: PACU Anesthesia Type: General Level of consciousness: awake and alert, oriented and patient cooperative Pain management: pain level controlled Vital Signs Assessment: post-procedure vital signs reviewed and stable Respiratory status: spontaneous breathing, nonlabored ventilation and respiratory function stable Cardiovascular status: blood pressure returned to baseline and stable Postop Assessment: no apparent nausea or vomiting Anesthetic complications: no   No notable events documented.  Last Vitals:  Vitals:   10/23/23 1641 10/23/23 1645  BP: (!) 140/65 (!) 142/99  Pulse: 77 69  Resp: 16 14  Temp: 36.5 C   SpO2: 97% 97%    Last Pain:  Vitals:   10/23/23 1641  TempSrc:   PainSc: 0-No pain                 Jacquelyne Matte

## 2023-10-23 NOTE — Progress Notes (Signed)
 Psychosocial Progressive/Outcome: ANOx4, calm and cooperative  Daughter at bedside   Pain/Comfort Progression/Outcome: Pain treated with PRN and scheduled medication   Clinical Progression/Outcome: Diet restarted  Encouraged to turn and reposition while on unit  WBAT LUE  DTV at 2200 Voids to urinal  No BM Dressing D/C/I Slept in between care Maintained safety

## 2023-10-23 NOTE — Plan of Care (Signed)
 Assumed care at 1900. Pt has been resting comfortably in bed overnight. Pain is controlled, see MAR. Pt has been repositioned in bed by staff. Family at the bedside. IV access replaced. Pt understands his procedure is supposed to be completed at some point today. Consent paperwork is in the chart. Fall preventions in place. No significant events overnight.   Problem: Education: Goal: Knowledge of General Education information will improve Description: Including pain rating scale, medication(s)/side effects and non-pharmacologic comfort measures Outcome: Progressing   Problem: Health Behavior/Discharge Planning: Goal: Ability to manage health-related needs will improve Outcome: Progressing   Problem: Clinical Measurements: Goal: Ability to maintain clinical measurements within normal limits will improve Outcome: Progressing Goal: Will remain free from infection Outcome: Progressing Goal: Diagnostic test results will improve Outcome: Progressing Goal: Respiratory complications will improve Outcome: Progressing Goal: Cardiovascular complication will be avoided Outcome: Progressing   Problem: Activity: Goal: Risk for activity intolerance will decrease Outcome: Progressing   Problem: Nutrition: Goal: Adequate nutrition will be maintained Outcome: Progressing   Problem: Coping: Goal: Level of anxiety will decrease Outcome: Progressing   Problem: Elimination: Goal: Will not experience complications related to bowel motility Outcome: Progressing Goal: Will not experience complications related to urinary retention Outcome: Progressing   Problem: Pain Managment: Goal: General experience of comfort will improve and/or be controlled Outcome: Progressing   Problem: Safety: Goal: Ability to remain free from injury will improve Outcome: Progressing   Problem: Skin Integrity: Goal: Risk for impaired skin integrity will decrease Outcome: Progressing

## 2023-10-23 NOTE — Progress Notes (Signed)
 No changes overnight.  The risks and benefits of arthrotomy and removal of left elbow hardware were discussed with the patient and his family, including the possibility of persistent infection, nerve injury, vessel injury, wound breakdown, arthritis, symptomatic hardware, DVT/ PE, loss of motion, occult nonunion or new fracture, and need for further surgery among others.  We also specifically discussed the possible need to stage surgery and the possibility of searching out the migrated k wire in the forearm soft tissues.  These risks were acknowledged and consent was provided to proceed.  Justin Lia, MD [facility], [facility] [phone]

## 2023-10-23 NOTE — Anesthesia Procedure Notes (Signed)
 Procedure Name: Intubation Date/Time: 10/23/2023 1:56 PM  Performed by: Alphia Jasmine, CRNAPre-anesthesia Checklist: Patient identified, Emergency Drugs available, Suction available, Timeout performed and Patient being monitored Patient Re-evaluated:Patient Re-evaluated prior to induction Oxygen Delivery Method: Circle system utilized Preoxygenation: Pre-oxygenation with 100% oxygen Induction Type: IV induction Ventilation: Mask ventilation without difficulty Laryngoscope Size: Mac and 4 Grade View: Grade I Tube type: Oral Tube size: 7.5 mm Number of attempts: 1 Airway Equipment and Method: Stylet Placement Confirmation: ETT inserted through vocal cords under direct vision, positive ETCO2, CO2 detector and breath sounds checked- equal and bilateral Secured at: 23 cm Tube secured with: Tape Dental Injury: Teeth and Oropharynx as per pre-operative assessment

## 2023-10-23 NOTE — Transfer of Care (Signed)
 Immediate Anesthesia Transfer of Care Note  Patient: [name].  Procedure(s) Performed: IRRIGATION AND DEBRIDEMENT ELBOW (Left: Elbow) REMOVAL, HARDWARE (Left: Elbow)  Patient Location: PACU  Anesthesia Type:General  Level of Consciousness: awake and alert   Airway & Oxygen Therapy: Patient Spontanous Breathing  Post-op Assessment: Report given to RN and Post -op Vital signs reviewed and stable  Post vital signs: Reviewed and stable  Last Vitals:  Vitals Value Taken Time  BP 140/65 10/23/23 1641  Temp    Pulse 74 10/23/23 1643  Resp 12 10/23/23 1643  SpO2 97 % 10/23/23 1643  Vitals shown include unfiled device data.  Last Pain:  Vitals:   10/23/23 1012  TempSrc:   PainSc: 8       Patients Stated Pain Goal: 2 (10/23/23 1006)  Complications: No notable events documented.

## 2023-10-23 NOTE — Progress Notes (Signed)
   [name] has been requested to perform a transesophageal echocardiogram on [facility]. for bacteremia.    The patient does NOT have any absolute or relative contraindications to a Transesophageal Echocardiogram (TEE).  The patient has: No other conditions that may impact this procedure. Follow up platelet count in the morning, noted at 80,000 today.   After careful review of history and examination, the risks and benefits of transesophageal echocardiogram have been explained including risks of esophageal damage, perforation (1:10,000 risk), bleeding, pharyngeal hematoma as well as other potential complications associated with conscious sedation including aspiration, arrhythmia, respiratory failure and death. Alternatives to treatment were discussed, questions were answered. Patient is willing to proceed.   Signed, Johnie Nailer, NP  10/23/2023 6:59 PM

## 2023-10-24 ENCOUNTER — Inpatient Hospital Stay (HOSPITAL_COMMUNITY)

## 2023-10-24 ENCOUNTER — Encounter (HOSPITAL_COMMUNITY): Payer: Self-pay | Admitting: Orthopedic Surgery

## 2023-10-24 ENCOUNTER — Encounter (HOSPITAL_COMMUNITY)
Admission: EM | Disposition: A | Discharge status: Home Health | Payer: Self-pay | Source: Home / Self Care | Attending: Internal Medicine

## 2023-10-24 ENCOUNTER — Other Ambulatory Visit: Payer: Self-pay

## 2023-10-24 DIAGNOSIS — I700 Atherosclerosis of aorta: Secondary | ICD-10-CM

## 2023-10-24 DIAGNOSIS — R509 Fever, unspecified: Secondary | ICD-10-CM

## 2023-10-24 DIAGNOSIS — I10 Essential (primary) hypertension: Secondary | ICD-10-CM | POA: Diagnosis not present

## 2023-10-24 DIAGNOSIS — D469 Myelodysplastic syndrome, unspecified: Secondary | ICD-10-CM

## 2023-10-24 DIAGNOSIS — B9561 Methicillin susceptible Staphylococcus aureus infection as the cause of diseases classified elsewhere: Secondary | ICD-10-CM | POA: Diagnosis not present

## 2023-10-24 DIAGNOSIS — I340 Nonrheumatic mitral (valve) insufficiency: Secondary | ICD-10-CM

## 2023-10-24 DIAGNOSIS — M00022 Staphylococcal arthritis, left elbow: Secondary | ICD-10-CM

## 2023-10-24 DIAGNOSIS — I2510 Atherosclerotic heart disease of native coronary artery without angina pectoris: Secondary | ICD-10-CM

## 2023-10-24 DIAGNOSIS — R7881 Bacteremia: Secondary | ICD-10-CM | POA: Diagnosis not present

## 2023-10-24 DIAGNOSIS — N401 Enlarged prostate with lower urinary tract symptoms: Secondary | ICD-10-CM | POA: Diagnosis not present

## 2023-10-24 HISTORY — PX: TRANSESOPHAGEAL ECHOCARDIOGRAM (CATH LAB): EP1270

## 2023-10-24 LAB — CBC
HCT: 25.2 % — ABNORMAL LOW (ref 39.0–52.0)
Hemoglobin: 7.8 g/dL — ABNORMAL LOW (ref 13.0–17.0)
MCH: 23.7 pg — ABNORMAL LOW (ref 26.0–34.0)
MCHC: 31 g/dL (ref 30.0–36.0)
MCV: 76.6 fL — ABNORMAL LOW (ref 80.0–100.0)
Platelets: 69 10*3/uL — ABNORMAL LOW (ref 150–400)
RBC: 3.29 MIL/uL — ABNORMAL LOW (ref 4.22–5.81)
RDW: 17.5 % — ABNORMAL HIGH (ref 11.5–15.5)
WBC: 9.5 10*3/uL (ref 4.0–10.5)
nRBC: 0 % (ref 0.0–0.2)

## 2023-10-24 LAB — BASIC METABOLIC PANEL WITH GFR
Anion gap: 10 (ref 5–15)
BUN: 37 mg/dL — ABNORMAL HIGH (ref 8–23)
CO2: 22 mmol/L (ref 22–32)
Calcium: 8.3 mg/dL — ABNORMAL LOW (ref 8.9–10.3)
Chloride: 106 mmol/L (ref 98–111)
Creatinine, Ser: 1.21 mg/dL (ref 0.61–1.24)
GFR, Estimated: 59 mL/min — ABNORMAL LOW (ref >60)
Glucose, Bld: 149 mg/dL — ABNORMAL HIGH (ref 70–99)
Potassium: 4.1 mmol/L (ref 3.5–5.1)
Sodium: 138 mmol/L (ref 135–145)

## 2023-10-24 LAB — CULTURE, BLOOD (ROUTINE X 2)
Special Requests: ADEQUATE
Special Requests: ADEQUATE

## 2023-10-24 LAB — ECHO TEE

## 2023-10-24 LAB — PHOSPHORUS: Phosphorus: 3.9 mg/dL (ref 2.5–4.6)

## 2023-10-24 SURGERY — TRANSESOPHAGEAL ECHOCARDIOGRAM (TEE) (CATHLAB)
Anesthesia: Monitor Anesthesia Care

## 2023-10-24 MED ORDER — PROPOFOL 10 MG/ML IV BOLUS
INTRAVENOUS | Status: DC | PRN
  Administered 2023-10-24 (×2): 20 mg via INTRAVENOUS
  Administered 2023-10-24: 50 mg via INTRAVENOUS

## 2023-10-24 MED ORDER — SODIUM CHLORIDE 0.9 % IV SOLN: INTRAVENOUS | Status: DC

## 2023-10-24 MED ORDER — PROPOFOL 500 MG/50ML IV EMUL
INTRAVENOUS | Status: DC | PRN
Start: 2023-10-24 — End: 2023-10-24
  Administered 2023-10-24: 100 ug/kg/min via INTRAVENOUS

## 2023-10-24 MED ORDER — GLYCOPYRROLATE PF 0.2 MG/ML IJ SOSY
PREFILLED_SYRINGE | INTRAMUSCULAR | Status: DC | PRN
  Administered 2023-10-24: .2 mg via INTRAVENOUS

## 2023-10-24 MED ORDER — LIDOCAINE 2% (20 MG/ML) 5 ML SYRINGE
INTRAMUSCULAR | Status: DC | PRN
  Administered 2023-10-24: 80 mg via INTRAVENOUS

## 2023-10-24 MED ORDER — GADOBUTROL 1 MMOL/ML IV SOLN
8.0000 mL | Freq: Once | INTRAVENOUS | Status: AC | PRN
  Administered 2023-10-24: 8 mL via INTRAVENOUS

## 2023-10-24 NOTE — Assessment & Plan Note (Signed)
 10-24-2023 ID following. On IV Ancef . [name] to be determined by ID.  10-25-2023 awaiting [name] and PICC orders from ID. Family plans on taking pt home. Discussed with pt's dtr [name].  10-26-2023 Regimen: Cefazolin  2g IV every 8 hours. End date: 12/18/23 (8 weeks from OR 4/22)

## 2023-10-24 NOTE — Subjective & Objective (Signed)
 Pt seen and examined.  MRI lumbar spine showed L4-S1 epidural abscess. ID requested neurosurgery consulted. Discussed with Dr. Larrie Po with neurosurgery.  Met with pt and dtr beth at bedside. Family plans on pt returning home with them.

## 2023-10-24 NOTE — Evaluation (Addendum)
 Physical Therapy Evaluation Patient Details Name: Justin [name] [facility] MRN: [id number] DOB: [birth year] Today's Date: 10/24/2023  History of Present Illness  Pt is an 83 yo male admitted to [facility] ED on 10/21/23 with c/o chronic L elbow pain, conserns of septic arthritis of L elbow. Pt S/p I&D and removal of hardward L elbow on 10/23/23. PMH of HTN, CAD, BPH, hx of TIA, DDD, low back pain, MDS.  Clinical Impression  Pt admitted with above diagnosis and presents to PT with functional limitations due to deficits listed below (See PT problem list). Pt needs skilled PT to maximize independence and safety. Pt prior to admission living with spouse and providing some assistance to her. The last couple of weeks pt using cane or walker do to hip/back pain. Pt currently requiring CGA for ambulation and benefitted from rollator. Daughter present and reports she and her brother have been providing support for pt's wife while pt hospitalized and will provide support for pt as well. Feel pt can benefit from HHPT after dc.           If plan is discharge home, recommend the following: A little help with walking and/or transfers;Assistance with cooking/housework;A lot of help with bathing/dressing/bathroom;Help with stairs or ramp for entrance;Assist for transportation   Can travel by private vehicle        Equipment Recommendations Rollator (4 wheels)  Recommendations for Other Services       Functional Status Assessment Patient has had a recent decline in their functional status and demonstrates the ability to make significant improvements in function in a reasonable and predictable amount of time.     Precautions / Restrictions Precautions Precautions: Fall Recall of Precautions/Restrictions: Intact Precaution/Restrictions Comments: sling for comfort Required Braces or Orthoses: Sling Restrictions Weight Bearing Restrictions Per Provider Order: Yes LUE Weight Bearing Per Provider Order: Weight bearing  as tolerated      Mobility  Bed Mobility               General bed mobility comments: Pt up on EOB    Transfers Overall transfer level: Needs assistance Equipment used: Rollator (4 wheels) Transfers: Sit to/from Stand Sit to Stand: Contact guard assist           General transfer comment: Incr time to perform. Assist for safety    Ambulation/Gait Ambulation/Gait assistance: Min assist, Contact guard assist Gait Distance (Feet): 40 Feet Assistive device: Rollator (4 wheels) Gait Pattern/deviations: Step-to pattern, Decreased step length - right, Decreased step length - left, Shuffle, Trunk flexed, Knee flexed in stance - right, Knee flexed in stance - left Gait velocity: decr Gait velocity interpretation: <1.31 ft/sec, indicative of household ambulator   General Gait Details: Initially min assist for balance and improved to CGA as distance incr. Pt with lt hand resting on handgrip of rollator but not providing support.  Stairs            Wheelchair Mobility     Tilt Bed    Modified Rankin (Stroke Patients Only)       Balance Overall balance assessment: Needs assistance Sitting-balance support: Feet supported, No upper extremity supported Sitting balance-Leahy Scale: Fair     Standing balance support: Bilateral upper extremity supported, During functional activity Standing balance-Leahy Scale: Poor Standing balance comment: UE support for static standing                             Pertinent Vitals/Pain Pain Assessment Pain  Assessment: Faces Faces Pain Scale: Hurts little more Pain Location: LUE Pain Descriptors / Indicators: Aching, Sore Pain Intervention(s): Limited activity within patient's tolerance    Home Living Family/patient expects to be discharged to:: Private residence Living Arrangements: Spouse/significant other Available Help at Discharge: Family;Available PRN/intermittently Type of Home: House Home Access: Stairs  to enter   [employer] of Steps: 1 +1. over thersholds   Home Layout: One level Home Equipment: Cane - single Librarian, academic (2 wheels);Grab bars - tub/shower;BSC/3in1;Lift chair Additional Comments: folding chair as shower seat    Prior Function Prior Level of Function : Independent/Modified Independent;Driving             Mobility Comments: Amb no AD or SPC; using RW in the last week following onset of back pain ADLs Comments: Ind; takes care of spouse (help her get off toilet, pericare, etc)     Extremity/Trunk Assessment   Upper Extremity Assessment Upper Extremity Assessment: Defer to OT evaluation LUE Deficits / Details: LUE with edema from forearm to hand, fingers 4 & 5 locked into flexion at baseline from past injury. shoulder AROM limited to 40 degrees and elbow ext lag ~50 degrees AROM while flexion AROM  ~ 90 degrees. PROM able to acheive ext lag ~40 deg LUE Sensation: decreased light touch    Lower Extremity Assessment Lower Extremity Assessment: Generalized weakness    Cervical / Trunk Assessment Cervical / Trunk Assessment: Kyphotic  Communication   Communication Communication: No apparent difficulties    Cognition Arousal: Alert Behavior During Therapy: WFL for tasks assessed/performed   PT - Cognitive impairments: No apparent impairments                         Following commands: Intact       Cueing Cueing Techniques: Verbal cues     General Comments      Exercises     Assessment/Plan    PT Assessment Patient needs continued PT services  PT Problem List Decreased strength;Decreased balance;Decreased mobility       PT Treatment Interventions DME instruction;Therapeutic activities;Gait training;Therapeutic exercise;Patient/family education;Balance training;Stair training;Functional mobility training    PT Goals (Current goals can be found in the Care Plan section)  Acute Rehab PT Goals Patient Stated Goal:  return home PT Goal Formulation: With patient Time For Goal Achievement: 11/07/23 Potential to Achieve Goals: Good    Frequency Min 2X/week     Co-evaluation       OT goals addressed during session: ADL's and self-care;Strengthening/ROM       AM-PAC PT "6 Clicks" Mobility  Outcome Measure Help needed turning from your back to your side while in a flat bed without using bedrails?: A Little Help needed moving from lying on your back to sitting on the side of a flat bed without using bedrails?: A Lot Help needed moving to and from a bed to a chair (including a wheelchair)?: A Little Help needed standing up from a chair using your arms (e.g., wheelchair or bedside chair)?: A Little Help needed to walk in hospital room?: A Little Help needed climbing 3-5 steps with a railing? : A Lot 6 Click Score: 16    End of Session Equipment Utilized During Treatment: Gait belt Activity Tolerance: Patient tolerated treatment well Patient left: in bed;with family/visitor present;Other (comment) (OT present)   PT Visit Diagnosis: Other abnormalities of gait and mobility (R26.89);Muscle weakness (generalized) (M62.81);Difficulty in walking, not elsewhere classified (R26.2)    Time: [phone]  PT Time Calculation (min) (ACUTE ONLY): 14 min   Charges:   PT Evaluation $PT Eval Moderate Complexity: 1 Mod   PT General Charges $$ ACUTE PT VISIT: 1 Visit         [employer] PT Acute Rehabilitation Services Office [phone]   Pura Browns [facility] 10/24/2023, 11:32 AM

## 2023-10-24 NOTE — Transfer of Care (Signed)
 Immediate Anesthesia Transfer of Care Note  Patient: Justin [name] [facility].  Procedure(s) Performed: TRANSESOPHAGEAL ECHOCARDIOGRAM  Patient Location: Cath Lab  Anesthesia Type:MAC  Level of Consciousness: awake  Airway & Oxygen Therapy: Patient Spontanous Breathing and Patient connected to nasal cannula oxygen  Post-op Assessment: Report given to RN and Post -op Vital signs reviewed and stable  Post vital signs: Reviewed and stable  Last Vitals:  Vitals Value Taken Time  BP 121/51 10/24/23 1014  Temp    Pulse 65 10/24/23 1014  Resp 15 10/24/23 1014  SpO2 96 % 10/24/23 1014  Vitals shown include unfiled device data.  Last Pain:  Vitals:   10/24/23 0917  TempSrc: Temporal  PainSc:       Patients Stated Pain Goal: 2 (10/23/23 1006)  Complications: No notable events documented.

## 2023-10-24 NOTE — Progress Notes (Signed)
 Transition of Care [facility]) - Inpatient Brief Assessment   Patient Details  Name: Justin [name] [facility]. MRN: [id number] Date of Birth: 1940-12-14  Transition of Care [facility]) CM/SW Contact:    Dane Dung, RN Phone Number: 10/24/2023, 12:45 PM   Clinical Narrative: CM met with patient at the bedside to discuss TOc needs.  Patient's daughter was also called to clarify home needs.  Patient lives at home with spouse and son and plans to return home when medically stable for discharge.  DME at the home includes potty chair.  Patient is pending transfer for MRI test this afternoon.  I called the daughter and provided choice regarding home health agency and daughter/patient chose [facility].  [place], RNCM was called and accepted for [facility] RN, PT, OT.  HH orders placed to be co-signed by MD.  I called [employer] and requested delivery of rolator to the patient's bedside today.  Patient will likely need home IV antibiotics and is pending culture results.  I called Kay Parson, RNCM and she will continue to follow the patient for IV antibiotic needs.  Patient has available son/daughter to assist with home infusions per daughter.  Patient will have PICC line placed today.  CM will continue to follow the patient for TOC needs.   Transition of Care Asessment: Insurance and Status: (P) Insurance coverage has been reviewed Patient has primary care physician: (P) Yes Home environment has been reviewed: (P) from home with spouse, son Prior level of function:: (P) Independent Prior/Current Home Services: (P) No current home services Social Drivers of Health Review: (P) SDOH reviewed interventions complete Readmission risk has been reviewed: (P) Yes Transition of care needs: (P) transition of care needs identified, TOC will continue to follow

## 2023-10-24 NOTE — Interval H&P Note (Signed)
 History and Physical Interval Note:  10/24/2023 9:30 AM  Justin [name].  has presented today for surgery, with the diagnosis of bacteremia.  The various methods of treatment have been discussed with the patient and family. After consideration of risks, benefits and other options for treatment, the patient has consented to  Procedure(s): TRANSESOPHAGEAL ECHOCARDIOGRAM (N/A) as a surgical intervention.  The patient's history has been reviewed, patient examined, no change in status, stable for surgery.  I have reviewed the patient's chart and labs.  Questions were answered to the patient's satisfaction.     [employer]

## 2023-10-24 NOTE — Progress Notes (Signed)
 Orthopaedic Trauma Service Progress Note  Patient ID: Osiris Odriscoll [facility]. MRN: [id number] DOB/AGE: 1940/07/25 83 y.o.  Subjective:  Ortho issues stable All hardware removed from L elbow with the exception of a single lag screw which was encased in bone   Intra-op specimens show gram positive cocci in pairs   ROS As above  Objective:   VITALS:   Vitals:   10/23/23 1748 10/23/23 1941 10/24/23 0444 10/24/23 0757  BP: (!) 141/77 133/81 114/67 112/71  Pulse: 74 75 61 (!) 57  Resp:  17 18   Temp: (!) 97.3 F (36.3 C) 97.7 F (36.5 C) 97.8 F (36.6 C) 99.7 F (37.6 C)  TempSrc: Axillary Oral Oral Oral  SpO2:  97% 95% 98%  Weight:      Height:        Estimated body mass index is 24.84 kg/m as calculated from the following:   Height as of this encounter: 5\' 9"  (1.753 m).   Weight as of this encounter: 76.3 kg.   Intake/Output      04/22 0701 04/23 0700 04/23 0701 04/24 0700   P.O.     I.V. (mL/kg) 1000 (13.1)    IV Piggyback 150    Total Intake(mL/kg) 1150 (15.1)    Blood 75    Total Output 75    Net +1075         Urine Occurrence 1 x      LABS  Results for orders placed or performed during the hospital encounter of 10/21/23 (from the past 24 hours)  Type and screen [facility]     Status: None   Collection Time: 10/23/23  2:00 PM  Result Value Ref Range   ABO/RH(D) O NEG    Antibody Screen NEG    Sample Expiration      10/26/2023,2359 Performed at [facility], [address], [place], [place] [address]   Aerobic/Anaerobic Culture w Gram Stain (surgical/deep wound)     Status: None (Preliminary result)   Collection Time: 10/23/23  2:37 PM   Specimen: Synovial, Left Elbow; Body Fluid  Result Value Ref Range   Specimen Description SYNOVIAL    Special Requests SWAB OF LEFT ELBOW SYNOVIAL FLUID    Gram Stain      ABUNDANT WBC PRESENT, PREDOMINANTLY  PMN RARE GRAM POSITIVE COCCI IN PAIRS Gram Stain Report Called to,Read Back By and Verified With: RN B. HICKLIN  [id number]@ 2036 FH    Culture      CULTURE REINCUBATED FOR BETTER GROWTH Performed at [facility], [address] [place], [place] [address]    Report Status PENDING   Aerobic/Anaerobic Culture w Gram Stain (surgical/deep wound)     Status: None (Preliminary result)   Collection Time: 10/23/23  3:20 PM   Specimen: Soft Tissue, Other  Result Value Ref Range   Specimen Description TISSUE    Special Requests OLECRANON BURSA    Gram Stain      NO WBC SEEN RARE GRAM POSITIVE COCCI IN PAIRS Gram Stain Report Called to,Read Back By and Verified With: RN Janey Meek [phone] @ 1825 FH    Culture      NO GROWTH < 24 HOURS Performed at [facility], [address], [place], [place] [address]    Report  Status PENDING   Culture, blood (Routine X 2) w Reflex to ID Panel     Status: None (Preliminary result)   Collection Time: 10/23/23 10:13 PM   Specimen: BLOOD  Result Value Ref Range   Specimen Description BLOOD SITE NOT SPECIFIED    Special Requests      BOTTLES DRAWN AEROBIC AND ANAEROBIC Blood Culture adequate volume   Culture      NO GROWTH < 12 HOURS Performed at [facility], [address], [place], [place] [address]    Report Status PENDING   Culture, blood (Routine X 2) w Reflex to ID Panel     Status: None (Preliminary result)   Collection Time: 10/23/23 10:15 PM   Specimen: BLOOD  Result Value Ref Range   Specimen Description BLOOD SITE NOT SPECIFIED    Special Requests      BOTTLES DRAWN AEROBIC AND ANAEROBIC Blood Culture adequate volume   Culture      NO GROWTH < 12 HOURS Performed at [facility], [address], [place], [place] [address]    Report Status PENDING   CBC     Status: Abnormal   Collection Time: 10/24/23  6:52 AM  Result Value Ref Range   WBC 9.5 4.0 - 10.5 K/uL   RBC 3.29 (L) 4.22 - 5.81 MIL/uL   Hemoglobin 7.8  (L) 13.0 - 17.0 g/dL   HCT 09.8 (L) 11.9 - 14.7 %   MCV 76.6 (L) 80.0 - 100.0 fL   MCH 23.7 (L) 26.0 - 34.0 pg   MCHC 31.0 30.0 - 36.0 g/dL   RDW 82.9 (H) 56.2 - 13.0 %   Platelets 69 (L) 150 - 400 K/uL   nRBC 0.0 0.0 - 0.2 %  Basic metabolic panel     Status: Abnormal   Collection Time: 10/24/23  6:52 AM  Result Value Ref Range   Sodium 138 135 - 145 mmol/L   Potassium 4.1 3.5 - 5.1 mmol/L   Chloride 106 98 - 111 mmol/L   CO2 22 22 - 32 mmol/L   Glucose, Bld 149 (H) 70 - 99 mg/dL   BUN 37 (H) 8 - 23 mg/dL   Creatinine, Ser 8.65 0.61 - 1.24 mg/dL   Calcium  8.3 (L) 8.9 - 10.3 mg/dL   GFR, Estimated 59 (L) >60 mL/min   Anion gap 10 5 - 15  Phosphorus     Status: None   Collection Time: 10/24/23  6:52 AM  Result Value Ref Range   Phosphorus 3.9 2.5 - 4.6 mg/dL     PHYSICAL EXAM:   Gen: NAD  Ext:       Left upper extremity   Dressing c/d/I  Ext warm   Baseline clawhand deformity     Assessment/Plan: 1 Day Post-Op   Principal Problem:   Acute on chronic pain of left elbow concerning for septic arthritis Active Problems:   RLS (restless legs syndrome)   Low back pain   BPH (benign prostatic hyperplasia)   HTN (hypertension)   Coronary artery disease   GERD (gastroesophageal reflux disease)   Left elbow pain   MDS (myelodysplastic syndrome) (HCC)   Hypokalemia   MSSA bacteremia   Anti-infectives (From admission, onward)    Start     Dose/Rate Route Frequency Ordered Stop   10/22/23 1700  vancomycin  (VANCOREADY) IVPB 1250 mg/250 mL  Status:  Discontinued        1,250 mg 166.7 mL/hr over 90 Minutes Intravenous Every 24  hours 10/21/23 1914 10/22/23 0619   10/22/23 0715  ceFAZolin  (ANCEF ) IVPB 2g/100 mL premix        2 g 200 mL/hr over 30 Minutes Intravenous Every 8 hours 10/22/23 0619     10/21/23 2000  cefTRIAXone  (ROCEPHIN ) 2 g in sodium chloride  0.9 % 100 mL IVPB  Status:  Discontinued        2 g 200 mL/hr over 30 Minutes Intravenous Every 24 hours  10/21/23 1907 10/22/23 0619   10/21/23 1530  vancomycin  (VANCOREADY) IVPB 1500 mg/300 mL        1,500 mg 150 mL/hr over 120 Minutes Intravenous  Once 10/21/23 1519 10/21/23 1818   10/21/23 1530  ceFEPIme  (MAXIPIME ) 2 g in sodium chloride  0.9 % 100 mL IVPB        2 g 200 mL/hr over 30 Minutes Intravenous  Once 10/21/23 1519 10/21/23 1649     .  POD/HD#: 1  83 y/o male with L elbow septic arthritis, septic olecranon busitis, retained hardware L distal humerus and olecranon   -L elbow septic arthritis, septic olecranon busitis, retained hardware L distal humerus and olecranon s/p ROH L distal humerus and ulna, I&D L elbow, capsulectomy, bursectomy  No formal restrictions L elbow Dressing change tomorrow  Baseline elbow contracture and clawhand deformity Ice PRN   Elevate extremity to assist with swelling control   Sling for comfort only   - Pain management:  Multimodal   - ABL anemia/Hemodynamics  Monitor   Cbc in am   May need transfusion   - ID:   Abx per ID  - Dispo:  Ortho issues addressed  Abx per ID     Geroldine Kotyk, PA-C [phone] (C) 10/24/2023, 9:11 AM  [facility] [address] [place] [address] [phone] Deanna Expose) [phone] (F)    After 5pm and on the weekends please log on to Amion, go to orthopaedics and the look under the [facility] Call for the provider(s) on call. You can also call our office at [phone] and then follow the prompts to be connected to the call team.  Patient ID: [name]., male   DOB: [birth year], 83 y.o.   MRN: [id number]

## 2023-10-24 NOTE — Hospital Course (Signed)
 HPI: Justin Kehl. is a 83 y.o. male with medical history significant of HTN, CAD, BPH, hx of TIAs, DDD/chronic low back pain, MDS, thrombocytopenia, depression who presented to ED with complaints of acute on chronic left elbow pain. He started to have pain out of the ordinary last night around 8pm. He denies any trauma. This morning he called his daughter and she went over to his house. He was to weak and in too much pain to even get into the car so she called EMS. He can not move his left elbow at all. Pain 10/10. Pain radiates down his entire hand. He denies any fever, chilled at times. NO N/V. It is warm to touch, not really sure it's changed in color.    He has history of an open fracture in his left elbow in 2007 and had surgery. About 4 years ago one of the pins came lose and this was taken out. At baseline now he can not fully extend or flex to 90 degrees but has some movement in it. He can't used his hands at all. He states his hands are like a claw. He is right handed.      He has been feeling good. Denies any fever/chills, vision changes/headaches, chest pain or palpitations, shortness of breath or cough, abdominal pain, N/V/D, dysuria or leg swelling.      Denies any fever, vision changes/headaches, chest pain or palpitations, shortness of breath or cough, abdominal pain, N/V/D, dysuria or leg swelling.    He does not smoke or drink alcohol .    ER Course:  vitals: afebrile, bp: 133/69, HR; 78, RR: 16, oxygen: 94% Pertinent labs: WBC: 13, hgb: 9.0, platelets 104, potassium: 3.1,  Left elbow xray: Remote distal humeral and proximal olecranon plate and screw fracture fixation. No evidence of hardware failure. 2. 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 3. There is a linear 22 x less than 1 mm metallic possible needle within the soft tissues medial to the proximal diaphysis of the ulna, appearing within approximately 1.2 cm of the medial  proximal forearm skin surface. Recommend clinical correlation. In ED: given potassium, cefepime  and vanc and BC obtained. Lactic acid wnl. Fluid obtained from elbow. TRH asked to admit.   Significant Events: Admitted 10/21/2023 acute on chronic left elbow pain   Significant Labs: WBC 13.0, HgB 9.0, plt 104 ESR 12 CRP 9.3 Blood cx 4-20 Staph Aureus. MSSA  Significant Imaging Studies: Left elbow XR Remote distal humeral and proximal olecranon plate and screw fracture fixation. No evidence of hardware failure. 2. 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 3. There is a linear 22 x less than 1 mm metallic possible needle within the soft tissues medial to the proximal diaphysis of the ulna, appearing within approximately 1.2 cm of the medial proximal forearm skin surface. Recommend clinical correlation. MRI lumbar spine Findings concerning for septic arthritis at the left L5-S1 facet. 2. 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 3. Edema within the left psoas muscles with an area of probable phlegmonous change. Small fluid collections in the left dorsal paraspinous muscles, consistent with myositis. 4. Severe right L1-2, left L4-5 and left L5-S1 neural foraminal stenosis. 5. Left lateral recess narrowing at L4-5 and L5-S1 with mass effect on the left L5 and S1 nerve roots.   Antibiotic Therapy: Anti-infectives (From admission, onward)    Start     Dose/Rate Route Frequency Ordered Stop   10/26/23 0000  ceFAZolin  (ANCEF ) IVPB  2 g Intravenous Every 8 hours 10/26/23 1048 12/19/23 2359   10/22/23 1700  vancomycin  (VANCOREADY) IVPB 1250 mg/250 mL  Status:  Discontinued        1,250 mg 166.7 mL/hr over 90 Minutes Intravenous Every 24 hours 10/21/23 1914 10/22/23 0619   10/22/23 0715  ceFAZolin  (ANCEF ) IVPB 2g/100 mL premix        2 g 200 mL/hr over 30 Minutes Intravenous Every 8 hours 10/22/23 0619     10/21/23 2000  cefTRIAXone  (ROCEPHIN ) 2 g in sodium  chloride 0.9 % 100 mL IVPB  Status:  Discontinued        2 g 200 mL/hr over 30 Minutes Intravenous Every 24 hours 10/21/23 1907 10/22/23 0619   10/21/23 1530  vancomycin  (VANCOREADY) IVPB 1500 mg/300 mL        1,500 mg 150 mL/hr over 120 Minutes Intravenous  Once 10/21/23 1519 10/21/23 1818   10/21/23 1530  ceFEPIme  (MAXIPIME ) 2 g in sodium chloride  0.9 % 100 mL IVPB        2 g 200 mL/hr over 30 Minutes Intravenous  Once 10/21/23 1519 10/21/23 1649       Procedures: 10-24-2023 I&D left elbow 10-25-2023 TEE 10-25-2023 PICC line placement  Consultants: Orthopedics ID Neurosurgery

## 2023-10-24 NOTE — Progress Notes (Signed)
 PHARMACY CONSULT NOTE FOR:  OUTPATIENT  PARENTERAL ANTIBIOTIC THERAPY ([name])  Indication: MSSA L-elbow PJI Regimen: Cefazolin  2g IV every 8 hours End date: *** (*** weeks from OR 4/22)  IV antibiotic discharge orders are pended. To discharging provider:  please sign these orders via discharge navigator,  Select New Orders & click on the button choice - Manage This Unsigned Work.     Thank you for allowing pharmacy to be a part of this patient's care.  Garland Junk, PharmD, BCPS, BCIDP Infectious Diseases Clinical Pharmacist 10/24/2023 11:11 AM   **Pharmacist phone directory can now be found on [email or web address] ([email or web address]).  Listed under [facility].

## 2023-10-24 NOTE — CV Procedure (Signed)
   Transesophageal Echocardiogram  Indications:Fever, bacteremia  Time out performed  During this procedure the patient was administered propofol  under anesthesiology supervision to achieve and maintain moderate sedation.  The patient's heart rate, blood pressure, and oxygen saturation are monitored continuously during the procedure.   Findings:  Left Ventricle: Normal EF 55%  Mitral Valve: Mild MR, no veg  Aortic Valve: Trileaflet, no veg  Tricuspid Valve: Normal, no veg  Left Atrium: Normal, no left atrial appendage thrombus  Right Atrium: normal  Intraatrial septum: normal  Bubble Contrast Study: not performed  Impression: No vegetation.   Dorothye Gathers, MD

## 2023-10-24 NOTE — Progress Notes (Signed)
    Durable Medical Equipment  (From admission, onward)           Start     Ordered   10/24/23 1224  For home use only DME 4 wheeled rolling walker with seat  Once       Question Answer Comment  Patient needs a walker to treat with the following condition Arthritis of elbow, left   Patient needs a walker to treat with the following condition Generalized weakness      10/24/23 1224

## 2023-10-24 NOTE — Anesthesia Preprocedure Evaluation (Signed)
 Anesthesia Evaluation  Patient identified by MRN, date of birth, ID band Patient awake    Reviewed: Allergy & Precautions, NPO status , Patient's Chart, lab work & pertinent test results  History of Anesthesia Complications Negative for: history of anesthetic complications  Airway Mallampati: II  TM Distance: >3 FB Neck ROM: Full    Dental  (+) Edentulous Upper, Edentulous Lower, Dental Advisory Given   Pulmonary neg shortness of breath, neg COPD, neg recent URI, former smoker   breath sounds clear to auscultation       Cardiovascular hypertension, Pt. on medications (-) angina + CAD and + DOE   Rhythm:Regular   1. Left ventricular ejection fraction, by estimation, is 55 to 60%. The  left ventricle has normal function. The left ventricle has no regional  wall motion abnormalities. Left ventricular diastolic parameters were  normal. The average left ventricular  global longitudinal strain is -18.7 %. The global longitudinal strain is  normal.   2. Right ventricular systolic function is normal. The right ventricular  size is normal.   3. Left atrial size was moderately dilated.   4. The mitral valve is abnormal. Mild mitral valve regurgitation. No  evidence of mitral stenosis.   5. The aortic valve is tricuspid. There is mild calcification of the  aortic valve. There is mild thickening of the aortic valve. Aortic valve  regurgitation is trivial. Aortic valve sclerosis is present, with no  evidence of aortic valve stenosis.   6. The inferior vena cava is dilated in size with >50% respiratory  variability, suggesting right atrial pressure of 8 mmHg.      Nuclear stress EF: 60%. There were no significant wall motion abnormalities.  There was no ST segment deviation noted during stress.  Defect 1: There is a medium defect of mild severity present in the basal inferior, mid inferolateral, apical inferior and apical lateral  location. This defect is consistent with diaphragmatic attenuation artifact.  This is a low risk study. No significant ischemia identified.   Dorothye Gathers, MD    Neuro/Psych  Neuromuscular disease    GI/Hepatic Neg liver ROS,GERD  ,,  Endo/Other    Renal/GU Renal diseaseLab Results      Component                Value               Date                      NA                       138                 10/24/2023                K                        4.1                 10/24/2023                CO2                      22                  10/24/2023  GLUCOSE                  149 (H)             10/24/2023                BUN                      37 (H)              10/24/2023                CREATININE               1.21                10/24/2023                CALCIUM                   8.3 (L)             10/24/2023                GFR                      58.05 (L)           11/18/2021                GFRNONAA                 59 (L)              10/24/2023                Musculoskeletal  (+) Arthritis ,    Abdominal   Peds  Hematology  (+) Blood dyscrasia, anemia Lab Results      Component                Value               Date                      WBC                      9.5                 10/24/2023                HGB                      7.8 (L)             10/24/2023                HCT                      25.2 (L)            10/24/2023                MCV                      76.6 (L)            10/24/2023                PLT  69 (L)              10/24/2023              Anesthesia Other Findings   Reproductive/Obstetrics                              Anesthesia Physical Anesthesia Plan  ASA: 3  Anesthesia Plan: MAC   Post-op Pain Management: Minimal or no pain anticipated   Induction: Intravenous  PONV Risk Score and Plan: 1 and Treatment may vary due to age or medical condition and Propofol   infusion  Airway Management Planned: Nasal Cannula, Natural Airway and Simple Face Mask  Additional Equipment: None  Intra-op Plan:   Post-operative Plan:   Informed Consent: I have reviewed the patients History and Physical, chart, labs and discussed the procedure including the risks, benefits and alternatives for the proposed anesthesia with the patient or authorized representative who has indicated his/her understanding and acceptance.     Dental advisory given  Plan Discussed with: CRNA  Anesthesia Plan Comments:          Anesthesia Quick Evaluation

## 2023-10-24 NOTE — Progress Notes (Signed)
 [facility]  Date of Admission:  10/21/2023      Total days of antibiotics 3   Cefazolin            ASSESSMENT: Justin Charney. is a 83 y.o. male admitted with:   MSSA Bacteremia - BCx ( + ) 4/20, preliminarily cleared from 4/22 draw. Elevated temp overnight. WBC normalized.  TEE ( - ).  -follow repeated blood cultures to ensure clearance  -continue cefazolin    Left Elbow Osteomyelitis -  S/P Debridement and Hardware removal 4/22 with Dr. Guyann Leitz. One nail that was completely ingrown to bone in place. Intra-op cultures pending.  -continue cefazolin  -FU micro from OR   Back, Hip Pain -  He describes worsening waxing/waning pain that may be a little worse the last week. He got up and walked out in the hallway today and felt fine. He has a relationship with pain management and is on chronic narcotics for a disc problem.  Left hip is improved today as well. He has no hardware in either location.   -MRI Left hip / L spine with new complaints of exacerbated chronic pain in setting of MSSA BSI  Vascular Access -  Place PICC line for access in the home. Ordered for Rt arm given left arm had surgery recently.  -ordered for line to be placed tomorrow 4/24 to allow 48h of growth from blood cultures.   Called to update daughter, Justin [name] over the phone.    PLAN: PICC OK to place 4/14 if repeat blood cultures negative  MRI Left hip / L spine w/ and w/o  FU intra op cultures  Continue cefazolin  - may be able to do continuous infusion at home.    Principal Problem:   Acute on chronic pain of left elbow concerning for septic arthritis Active Problems:   RLS (restless legs syndrome)   Low back pain   BPH (benign prostatic hyperplasia)   HTN (hypertension)   Coronary artery disease   GERD (gastroesophageal reflux disease)   Left elbow pain   MDS (myelodysplastic syndrome) (HCC)   Hypokalemia   MSSA bacteremia    acetaminophen   650 mg Oral  Q8H   Or   acetaminophen   650 mg Rectal Q8H   amLODipine   5 mg Oral QHS   carbidopa -levodopa   1 tablet Oral QHS   cholecalciferol   3,000 Units Oral Daily   vitamin B-12  2,000 mcg Oral Daily   finasteride   5 mg Oral Daily   gabapentin   300 mg Oral TID   multivitamin  1 tablet Oral BID   oxyCODONE   10 mg Oral 5 X Daily   pravastatin   20 mg Oral Daily   rOPINIRole   1 mg Oral QID   tamsulosin   0.4 mg Oral q AM   terazosin   2 mg Oral QHS    SUBJECTIVE: Arm is getting a lot better compared to prior to surgery. Feeling better. Was having severe back pain, hip pains and leg pains.   Would like for us  to check in with his daughter Justin [name]    Review of Systems: Review of Systems  Constitutional:  Negative for chills and fever.  Gastrointestinal:  Negative for abdominal pain, nausea and vomiting.  Musculoskeletal:  Positive for back pain and joint pain.    Allergies  Allergen Reactions   Levofloxacin Other (See Comments)    REACTION: hands pealed    OBJECTIVE: Vitals:   10/24/23 1014 10/24/23 1015  10/24/23 1030 10/24/23 1040  BP: (!) 121/51 (!) 117/59 (!) 145/60 (!) 158/68  Pulse: 65 65 72 78  Resp: 15 15 11 18   Temp:      TempSrc:      SpO2: 96% 96% 95% 95%  Weight:      Height:       Body mass index is 24.84 kg/m.  Physical Exam Constitutional:      Appearance: Normal appearance. He is not ill-appearing.  HENT:     Head: Normocephalic.     Mouth/Throat:     Mouth: Mucous membranes are moist.     Pharynx: Oropharynx is clear.  Eyes:     General: No scleral icterus. Cardiovascular:     Rate and Rhythm: Normal rate and regular rhythm.  Pulmonary:     Effort: Pulmonary effort is normal.  Musculoskeletal:        General: Normal range of motion.     Cervical back: Normal range of motion.     Comments: Left elbow wrapped in OR dressing  Skin:    Coloration: Skin is not jaundiced or pale.  Neurological:     Mental Status: He is alert and oriented to person, place,  and time.  Psychiatric:        Mood and Affect: Mood normal.        Judgment: Judgment normal.     Lab Results Lab Results  Component Value Date   WBC 9.5 10/24/2023   HGB 7.8 (L) 10/24/2023   HCT 25.2 (L) 10/24/2023   MCV 76.6 (L) 10/24/2023   PLT 69 (L) 10/24/2023    Lab Results  Component Value Date   CREATININE 1.21 10/24/2023   BUN 37 (H) 10/24/2023   NA 138 10/24/2023   K 4.1 10/24/2023   CL 106 10/24/2023   CO2 22 10/24/2023    Lab Results  Component Value Date   ALT 11 10/21/2023   AST 16 10/21/2023   ALKPHOS 60 10/21/2023   BILITOT 1.0 10/21/2023     Microbiology: Recent Results (from the past 240 hours)  Blood Cultures x 2 sites     Status: Abnormal   Collection Time: 10/21/23 11:27 AM   Specimen: Site Not Specified; Blood  Result Value Ref Range Status   Specimen Description   Final    SITE NOT SPECIFIED Performed at [facility], [address] [place], [place] [address]    Special Requests   Final    BOTTLES DRAWN AEROBIC AND ANAEROBIC Blood Culture adequate volume Performed at [facility], [address] [place], [place] [address]    Culture  Setup Time   Final    GRAM POSITIVE COCCI IN CLUSTERS IN BOTH AEROBIC AND ANAEROBIC BOTTLES CRITICAL RESULT CALLED TO, READ BACK BY AND VERIFIED WITH: PHARMD G ABBOTT 10/22/2023 @ 0610 BY AB Performed at [facility], [address] [place], [place] [address]    Culture STAPHYLOCOCCUS AUREUS (A)  Final   Report Status 10/24/2023 FINAL  Final   Organism ID, Bacteria STAPHYLOCOCCUS AUREUS  Final      Susceptibility   Staphylococcus aureus - MIC*    CIPROFLOXACIN >=8 RESISTANT Resistant     ERYTHROMYCIN >=8 RESISTANT Resistant     GENTAMICIN <=0.5 SENSITIVE Sensitive     OXACILLIN 0.5 SENSITIVE Sensitive     TETRACYCLINE <=1 SENSITIVE Sensitive     VANCOMYCIN  1 SENSITIVE Sensitive     TRIMETH /SULFA  >=320 RESISTANT Resistant     CLINDAMYCIN  <=  0.25 SENSITIVE  Sensitive     RIFAMPIN <=0.5 SENSITIVE Sensitive     Inducible Clindamycin  NEGATIVE Sensitive     LINEZOLID 2 SENSITIVE Sensitive     * STAPHYLOCOCCUS AUREUS  Blood Culture ID Panel (Reflexed)     Status: Abnormal   Collection Time: 10/21/23 11:27 AM  Result Value Ref Range Status   Enterococcus faecalis NOT DETECTED NOT DETECTED Final   Enterococcus Faecium NOT DETECTED NOT DETECTED Final   Listeria monocytogenes NOT DETECTED NOT DETECTED Final   Staphylococcus species DETECTED (A) NOT DETECTED Final    Comment: CRITICAL RESULT CALLED TO, READ BACK BY AND VERIFIED WITH: PHARMD G ABBOTT 10/22/2023 @ 0610 BY AB    Staphylococcus aureus (BCID) DETECTED (A) NOT DETECTED Final    Comment: CRITICAL RESULT CALLED TO, READ BACK BY AND VERIFIED WITH: PHARMD G ABBOTT 10/22/2023 @ 0610 BY AB    Staphylococcus epidermidis NOT DETECTED NOT DETECTED Final   Staphylococcus lugdunensis NOT DETECTED NOT DETECTED Final   Streptococcus species NOT DETECTED NOT DETECTED Final   Streptococcus agalactiae NOT DETECTED NOT DETECTED Final   Streptococcus pneumoniae NOT DETECTED NOT DETECTED Final   Streptococcus pyogenes NOT DETECTED NOT DETECTED Final   A.calcoaceticus-baumannii NOT DETECTED NOT DETECTED Final   Bacteroides fragilis NOT DETECTED NOT DETECTED Final   Enterobacterales NOT DETECTED NOT DETECTED Final   Enterobacter cloacae complex NOT DETECTED NOT DETECTED Final   Escherichia coli NOT DETECTED NOT DETECTED Final   Klebsiella aerogenes NOT DETECTED NOT DETECTED Final   Klebsiella oxytoca NOT DETECTED NOT DETECTED Final   Klebsiella pneumoniae NOT DETECTED NOT DETECTED Final   Proteus species NOT DETECTED NOT DETECTED Final   Salmonella species NOT DETECTED NOT DETECTED Final   Serratia marcescens NOT DETECTED NOT DETECTED Final   Haemophilus influenzae NOT DETECTED NOT DETECTED Final   Neisseria meningitidis NOT DETECTED NOT DETECTED Final   Pseudomonas aeruginosa NOT DETECTED NOT DETECTED  Final   Stenotrophomonas maltophilia NOT DETECTED NOT DETECTED Final   Candida albicans NOT DETECTED NOT DETECTED Final   Candida auris NOT DETECTED NOT DETECTED Final   Candida glabrata NOT DETECTED NOT DETECTED Final   Candida krusei NOT DETECTED NOT DETECTED Final   Candida parapsilosis NOT DETECTED NOT DETECTED Final   Candida tropicalis NOT DETECTED NOT DETECTED Final   Cryptococcus neoformans/gattii NOT DETECTED NOT DETECTED Final   Meth resistant mecA/C and MREJ NOT DETECTED NOT DETECTED Final    Comment: Performed at [facility], [address], [place], [place] [address]  Blood Cultures x 2 sites     Status: Abnormal   Collection Time: 10/21/23 12:19 PM   Specimen: BLOOD  Result Value Ref Range Status   Specimen Description   Final    BLOOD BLOOD RIGHT ARM Performed at [facility], [address], [place], [place] [address]    Special Requests   Final    BOTTLES DRAWN AEROBIC AND ANAEROBIC Blood Culture adequate volume Performed at [facility], [address], [place], [place] [address]    Culture  Setup Time   Final    GRAM POSITIVE COCCI IN CLUSTERS ANAEROBIC BOTTLE ONLY CRITICAL VALUE NOTED.  VALUE IS CONSISTENT WITH PREVIOUSLY REPORTED AND CALLED VALUE.    Culture (A)  Final    STAPHYLOCOCCUS AUREUS SUSCEPTIBILITIES PERFORMED ON PREVIOUS CULTURE WITHIN THE LAST 5 DAYS. Performed at [facility], [address], [place], [place] [address]    Report Status 10/24/2023 FINAL  Final  Body fluid culture w Gram Stain     Status: None   Collection Time: 10/21/23  1:59 PM   Specimen: Synovium; Synovial Fluid  Result Value Ref Range Status   Specimen Description   Final    SYNOVIAL Performed at [facility], [address] [place], [place] [address]    Special Requests   Final    NONE LEFT ELBOW Performed at [facility], [address] [place], [place] [address]    Gram Stain    Final    ABUNDANT WBC PRESENT,BOTH PMN AND MONONUCLEAR ABUNDANT GRAM POSITIVE COCCI Performed at [facility], [address] [place], [place] [address]    Culture ABUNDANT STAPHYLOCOCCUS AUREUS  Final   Report Status 10/23/2023 FINAL  Final   Organism ID, Bacteria STAPHYLOCOCCUS AUREUS  Final      Susceptibility   Staphylococcus aureus - MIC*    CIPROFLOXACIN >=8 RESISTANT Resistant     ERYTHROMYCIN >=8 RESISTANT Resistant     GENTAMICIN <=0.5 SENSITIVE Sensitive     OXACILLIN 0.5 SENSITIVE Sensitive     TETRACYCLINE <=1 SENSITIVE Sensitive     VANCOMYCIN  <=0.5 SENSITIVE Sensitive     TRIMETH /SULFA  >=320 RESISTANT Resistant     CLINDAMYCIN  <=0.25 SENSITIVE Sensitive     RIFAMPIN <=0.5 SENSITIVE Sensitive     Inducible Clindamycin  NEGATIVE Sensitive     LINEZOLID 2 SENSITIVE Sensitive     * ABUNDANT STAPHYLOCOCCUS AUREUS  Gram stain     Status: None   Collection Time: 10/21/23  1:59 PM   Specimen: Synovium; Body Fluid  Result Value Ref Range Status   Specimen Description SYNOVIAL  Final   Special Requests LEFT ELBOW  Final   Gram Stain   Final    ABUNDANT WBC SEEN GRAM POSITIVE COCCI Gram Stain Report Called to,Read Back By and Verified With: Alonna Art RN AT 1529 ON 10/21/2023 BY Anibal [place] Performed at [facility], [address], [place], [place] [address]    Report Status 10/21/2023 FINAL  Final  Aerobic/Anaerobic Culture w Gram Stain (surgical/deep wound)     Status: None (Preliminary result)   Collection Time: 10/23/23  2:37 PM   Specimen: Synovial, Left Elbow; Body Fluid  Result Value Ref Range Status   Specimen Description SYNOVIAL  Final   Special Requests SWAB OF LEFT ELBOW SYNOVIAL FLUID  Final   Gram Stain   Final    ABUNDANT WBC PRESENT, PREDOMINANTLY PMN RARE GRAM POSITIVE COCCI IN PAIRS Gram Stain Report Called to,Read Back By and Verified With: RN B. HICKLIN  [id number]@ 2036 FH    Culture   Final    CULTURE REINCUBATED FOR BETTER  GROWTH Performed at [facility], [address], [place], [place] [address]    Report Status PENDING  Incomplete  Aerobic/Anaerobic Culture w Gram Stain (surgical/deep wound)     Status: None (Preliminary result)   Collection Time: 10/23/23  3:20 PM   Specimen: Soft Tissue, Other  Result Value Ref Range Status   Specimen Description TISSUE  Final   Special Requests OLECRANON BURSA  Final   Gram Stain   Final    NO WBC SEEN RARE GRAM POSITIVE COCCI IN PAIRS Gram Stain Report Called to,Read Back By and Verified With: RN Janey Meek [phone] @ 1825 FH    Culture   Final    NO GROWTH < 24 HOURS Performed at [facility], [address] [place], [place] [address]  Report Status PENDING  Incomplete  Culture, blood (Routine X 2) w Reflex to ID Panel     Status: None (Preliminary result)   Collection Time: 10/23/23 10:13 PM   Specimen: BLOOD  Result Value Ref Range Status   Specimen Description BLOOD SITE NOT SPECIFIED  Final   Special Requests   Final    BOTTLES DRAWN AEROBIC AND ANAEROBIC Blood Culture adequate volume   Culture   Final    NO GROWTH < 12 HOURS Performed at [facility], [address], [place], [place] [address]    Report Status PENDING  Incomplete  Culture, blood (Routine X 2) w Reflex to ID Panel     Status: None (Preliminary result)   Collection Time: 10/23/23 10:15 PM   Specimen: BLOOD  Result Value Ref Range Status   Specimen Description BLOOD SITE NOT SPECIFIED  Final   Special Requests   Final    BOTTLES DRAWN AEROBIC AND ANAEROBIC Blood Culture adequate volume   Culture   Final    NO GROWTH < 12 HOURS Performed at [facility], [address], [place], [place] [address]    Report Status PENDING  Incomplete    Gibson Kurtz, MSN, NP-C [facility] Pager: [phone]  @TODAY @ 11:05 AM

## 2023-10-24 NOTE — Evaluation (Signed)
 Occupational Therapy Evaluation Patient Details Name: Justin [name] [facility]. MRN: [id number] DOB: 1940/09/18 Today's Date: 10/24/2023   History of Present Illness   Pt is an 83 yo male admitted to [facility] ED on 10/21/23 with c/o chronic L elbow pain, conserns of septic arthritis of L elbow. Pt S/p I&D L elbow on 10/23/23. PMH of HTN, CAD, BPH, hx of TIA, DDD, low back pain, MDS.     Clinical Impressions Pt admitted for above, PTA pt was independent with ADLs/iADLs, ambulatory no AD but recently using SPC and RW recently following back pain. 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 Pt currently presenting with impaired ROM and  functional use of LUE, generally weak on that side with increased edema but his RUE is [facility]. Pt able to ambulate with CGA with Rollator but needs mod A to setup A for ADLs. Discussed with pt daughter that pt current needs more help getting dressed and with bed mobility- pt agreeable to sleep in lift chair at DC. OT to continue following pt acutley to address listed deficits and help transition to next level of care. Patient would benefit from post acute Home OT services to help maximize functional independence in natural environment with recommended support and equipment.     If plan is discharge home, recommend the following:   A little help with bathing/dressing/bathroom;Assistance with cooking/housework;Assist for transportation;Other (comment) (supervision for safety)     Functional Status Assessment   Patient has had a recent decline in their functional status and demonstrates the ability to make significant improvements in function in a reasonable and predictable amount of time.     Equipment Recommendations   Other (comment) (pt has rec DME)     Recommendations for Other Services         Precautions/Restrictions   Precautions Precautions: Fall Recall of Precautions/Restrictions: Intact Precaution/Restrictions Comments: sling for  comfort Restrictions Weight Bearing Restrictions Per Provider Order: Yes LUE Weight Bearing Per Provider Order: Weight bearing as tolerated     Mobility Bed Mobility Overal bed mobility: Needs Assistance Bed Mobility: Rolling, Sidelying to Sit, Sit to Supine Rolling: Min assist Sidelying to sit: Mod assist   Sit to supine: Mod assist   General bed mobility comments: mod A for Assist with BLEs    Transfers Overall transfer level: Needs assistance Equipment used: Rollator (4 wheels) Transfers: Sit to/from Stand Sit to Stand: Contact guard assist                  Balance Overall balance assessment: Needs assistance Sitting-balance support: Feet supported, No upper extremity supported Sitting balance-Leahy Scale: Fair     Standing balance support: Bilateral upper extremity supported, During functional activity Standing balance-Leahy Scale: Poor                             ADL either performed or assessed with clinical judgement   ADL Overall ADL's : Needs assistance/impaired Eating/Feeding: Set up;Sitting Eating/Feeding Details (indicate cue type and reason): using RUE Grooming: Wash/dry hands;Sitting;Set up Grooming Details (indicate cue type and reason): using RUE Upper Body Bathing: Sitting;Minimal assistance Upper Body Bathing Details (indicate cue type and reason): using predominantly RUE Lower Body Bathing: Sitting/lateral leans;Set up   Upper Body Dressing : Sitting;Minimal assistance Upper Body Dressing Details (indicate cue type and reason): educated pt on hemi dressing techinque. Lower Body Dressing: Moderate assistance;Sitting/lateral leans   Toilet Transfer: Contact guard assist;Rollator (4 wheels)   Toileting- Clothing  Manipulation and Hygiene: Sit to/from stand;Minimal assistance       Functional mobility during ADLs: Contact guard assist;Rollator (4 wheels) General ADL Comments: Educated pt on UE exercises, handout provided for  sling mangagement and hemi dressing.     Vision         Perception         Praxis         Pertinent Vitals/Pain Pain Assessment Pain Assessment: Faces Faces Pain Scale: Hurts little more Pain Location: LUE with ROM Pain Descriptors / Indicators: Aching, Sore Pain Intervention(s): Monitored during session, Limited activity within patient's tolerance     Extremity/Trunk Assessment Upper Extremity Assessment Upper Extremity Assessment: Right hand dominant;Generalized weakness;LUE deficits/detail LUE Deficits / Details: LUE with edema from forearm to hand, fingers 4 & 5 locked into flexion at baseline from past injury. shoulder AROM limited to 40 degrees and elbow ext lag ~50 degrees AROM while flexion AROM  ~ 90 degrees. PROM able to acheive ext lag ~40 deg LUE Sensation: decreased light touch       Cervical / Trunk Assessment Cervical / Trunk Assessment: Kyphotic   Communication Communication Communication: No apparent difficulties   Cognition Arousal: Alert Behavior During Therapy: WFL for tasks assessed/performed Cognition: No apparent impairments                               Following commands: Intact       Cueing  General Comments   Cueing Techniques: Verbal cues      Exercises Other Exercises Other Exercises: AAROM elbow flex/ext LUE Other Exercises: AROM LUE pronation/supination   Shoulder Instructions      Home Living Family/patient expects to be discharged to:: Private residence Living Arrangements: Spouse/significant other Available Help at Discharge: Family;Available PRN/intermittently (wife not able to physical assist) Type of Home: House Home Access: Stairs to enter [employer] of Steps: 1 +1. over thersholds   Home Layout: One level     Bathroom Shower/Tub: Producer, television/film/video:  (comfort height)     Home Equipment: Cane - single Librarian, academic (2 wheels);Grab bars -  tub/shower;BSC/3in1;Lift chair   Additional Comments: folding chair as shower seat      Prior Functioning/Environment Prior Level of Function : Independent/Modified Independent;Driving             Mobility Comments: Amb no AD or SPC; using RW in the last week following onset of back pain ADLs Comments: Ind; takes care of spouse (help her get off toilet, pericare, etc)    OT Problem List: Decreased strength;Decreased range of motion;Impaired balance (sitting and/or standing);Impaired UE functional use;Pain;Increased edema;Impaired sensation   OT Treatment/Interventions: Self-care/ADL training;DME and/or AE instruction;Therapeutic activities;Balance training;Patient/family education;Therapeutic exercise      OT Goals(Current goals can be found in the care plan section)   Acute Rehab OT Goals Patient Stated Goal: To go home OT Goal Formulation: With patient/family Time For Goal Achievement: 11/07/23 Potential to Achieve Goals: Good ADL Goals Pt Will Perform Upper Body Dressing: sitting;with modified independence Pt Will Perform Lower Body Dressing: sitting/lateral leans;sit to/from stand;with min assist Pt Will Transfer to Toilet: with supervision;ambulating Pt/caregiver will Perform Home Exercise Program: Increased ROM;Increased strength;Left upper extremity;Independently;With written HEP provided Additional ADL Goal #2: Pt will demonstrate/verbalize understanding of 2 strategies for edema management of the LUE.   OT Frequency:  Min 3X/week    Co-evaluation       OT goals addressed during  session: ADL's and self-care;Strengthening/ROM      AM-PAC OT "6 Clicks" Daily Activity     Outcome Measure Help from another person eating meals?: A Little Help from another person taking care of personal grooming?: A Little Help from another person toileting, which includes using toliet, bedpan, or urinal?: A Little Help from another person bathing (including washing, rinsing,  drying)?: A Little Help from another person to put on and taking off regular upper body clothing?: A Little Help from another person to put on and taking off regular lower body clothing?: A Lot 6 Click Score: 17   End of Session Equipment Utilized During Treatment: Rollator (4 wheels) Nurse Communication: Mobility status  Activity Tolerance: Patient tolerated treatment well Patient left: in bed;with call bell/phone within reach;Other (comment);with family/visitor present (transport in room for TEE)  OT Visit Diagnosis: Other abnormalities of gait and mobility (R26.89);Pain;Unsteadiness on feet (R26.81) Pain - Right/Left: Left Pain - part of body: Arm                Time: 1610-9604 OT Time Calculation (min): 42 min Charges:  OT General Charges $OT Visit: 1 Visit OT Evaluation $OT Eval Moderate Complexity: 1 Mod OT Treatments $Self Care/Home Management : 8-22 mins  10/24/2023  AB, OTR/L  Acute Rehabilitation Services  Office: [phone]   Jorene New 10/24/2023, 10:29 AM

## 2023-10-24 NOTE — Plan of Care (Signed)
 Assumed care at 1900. Pt is Aox4. Pt has expressed 5/10 pain overnight. Arm elevated on pillows, iced and in a sling. See MAR. Attempts to change patient position overnight. Pt has been NPO since MN for TEE procedure today. Fall precautions in place. Family at bedside. No significant events overnight.    Problem: Education: Goal: Knowledge of General Education information will improve Description: Including pain rating scale, medication(s)/side effects and non-pharmacologic comfort measures Outcome: Progressing   Problem: Health Behavior/Discharge Planning: Goal: Ability to manage health-related needs will improve Outcome: Progressing   Problem: Clinical Measurements: Goal: Ability to maintain clinical measurements within normal limits will improve Outcome: Progressing Goal: Will remain free from infection Outcome: Progressing Goal: Diagnostic test results will improve Outcome: Progressing Goal: Respiratory complications will improve Outcome: Progressing Goal: Cardiovascular complication will be avoided Outcome: Progressing   Problem: Activity: Goal: Risk for activity intolerance will decrease Outcome: Progressing   Problem: Nutrition: Goal: Adequate nutrition will be maintained Outcome: Progressing   Problem: Coping: Goal: Level of anxiety will decrease Outcome: Progressing   Problem: Elimination: Goal: Will not experience complications related to bowel motility Outcome: Progressing Goal: Will not experience complications related to urinary retention Outcome: Progressing   Problem: Pain Managment: Goal: General experience of comfort will improve and/or be controlled Outcome: Progressing   Problem: Safety: Goal: Ability to remain free from injury will improve Outcome: Progressing   Problem: Skin Integrity: Goal: Risk for impaired skin integrity will decrease Outcome: Progressing

## 2023-10-24 NOTE — Progress Notes (Signed)
 PROGRESS NOTE    Justin [name].  VHQ:[id number] DOB: 1940-09-27 DOA: 10/21/2023 PCP: Genia Kettering, MD  Subjective: Pt seen and examined. Had TEE today. Negative for vegetations. Remains on IV Ancef  per ID.  Awaiting MRI left hip and lumbar spine.   Hospital Course: HPI: Justin Nez. is a 83 y.o. male with medical history significant of HTN, CAD, BPH, hx of TIAs, DDD/chronic low back pain, MDS, thrombocytopenia, depression who presented to ED with complaints of acute on chronic left elbow pain. He started to have pain out of the ordinary last night around 8pm. He denies any trauma. This morning he called his daughter and she went over to his house. He was to weak and in too much pain to even get into the car so she called EMS. He can not move his left elbow at all. Pain 10/10. Pain radiates down his entire hand. He denies any fever, chilled at times. NO N/V. It is warm to touch, not really sure it's changed in color.    He has history of an open fracture in his left elbow in 2007 and had surgery. About 4 years ago one of the pins came lose and this was taken out. At baseline now he can not fully extend or flex to 90 degrees but has some movement in it. He can't used his hands at all. He states his hands are like a claw. He is right handed.      He has been feeling good. Denies any fever/chills, vision changes/headaches, chest pain or palpitations, shortness of breath or cough, abdominal pain, N/V/D, dysuria or leg swelling.      Denies any fever, vision changes/headaches, chest pain or palpitations, shortness of breath or cough, abdominal pain, N/V/D, dysuria or leg swelling.    He does not smoke or drink alcohol .    ER Course:  vitals: afebrile, bp: 133/69, HR; 78, RR: 16, oxygen: 94% Pertinent labs: WBC: 13, hgb: 9.0, platelets 104, potassium: 3.1,  Left elbow xray: Remote distal humeral and proximal olecranon plate and screw fracture fixation. No evidence of  hardware failure. 2. 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 3. There is a linear 22 x less than 1 mm metallic possible needle within the soft tissues medial to the proximal diaphysis of the ulna, appearing within approximately 1.2 cm of the medial proximal forearm skin surface. Recommend clinical correlation. In ED: given potassium, cefepime  and vanc and BC obtained. Lactic acid wnl. Fluid obtained from elbow. TRH asked to admit.   Significant Events: Admitted 10/21/2023 acute on chronic left elbow pain   Significant Labs: WBC 13.0, HgB 9.0, plt 104 ESR 12 CRP 9.3 Blood cx 4-20 Staph Aureus. MSSA  Significant Imaging Studies: Left elbow XR Remote distal humeral and proximal olecranon plate and screw fracture fixation. No evidence of hardware failure. 2. 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 3. There is a linear 22 x less than 1 mm metallic possible needle within the soft tissues medial to the proximal diaphysis of the ulna, appearing within approximately 1.2 cm of the medial proximal forearm skin surface. Recommend clinical correlation.   Antibiotic Therapy: Anti-infectives (From admission, onward)    Start     Dose/Rate Route Frequency Ordered Stop   10/22/23 1700  vancomycin  (VANCOREADY) IVPB 1250 mg/250 mL  Status:  Discontinued        1,250 mg 166.7 mL/hr over 90 Minutes Intravenous Every 24 hours 10/21/23 1914 10/22/23 0619   10/22/23 0715  ceFAZolin  (  ANCEF ) IVPB 2g/100 mL premix        2 g 200 mL/hr over 30 Minutes Intravenous Every 8 hours 10/22/23 0619     10/21/23 2000  cefTRIAXone  (ROCEPHIN ) 2 g in sodium chloride  0.9 % 100 mL IVPB  Status:  Discontinued        2 g 200 mL/hr over 30 Minutes Intravenous Every 24 hours 10/21/23 1907 10/22/23 0619   10/21/23 1530  vancomycin  (VANCOREADY) IVPB 1500 mg/300 mL        1,500 mg 150 mL/hr over 120 Minutes Intravenous  Once 10/21/23 1519 10/21/23 1818   10/21/23 1530   ceFEPIme  (MAXIPIME ) 2 g in sodium chloride  0.9 % 100 mL IVPB        2 g 200 mL/hr over 30 Minutes Intravenous  Once 10/21/23 1519 10/21/23 1649       Procedures: 10-24-2023 I&D left elbow 10-25-2023 TEE  Consultants: Orthopedics ID    Assessment and Plan: * Acute on chronic pain of left elbow concerning for septic arthritis 10-21-2023 through 10-24-2023. 83 year old presenting to ED with acute onset of pain in his left elbow, decreased mobility, warm to touch and labs concerning for possibly infected joint. He has history of an open fracture in his left elbow in 2007 and had surgery with hardware. About 4 years ago one of the pins came lose and this was taken out.  -admit to [facility] -ortho consulted (Dr. Bernard Brick)  -joint aspiration obtained by EDP and described as purulent. Gram stain showing gram positive cocci -continue vancomycin . Change cefepime  to rocephin   -check inflammatory markers -check uric acid  -no hx of gout or pseudo gout, but follow synovial fluid for crystals  -trend PCT  -CT/other imaging per ortho  -pain control difficult. Continue home oxycodone  five times/day. Morphine  q 2 hours for severe pain, may need dilaudid , but transferring to [facility].   10-24-2023 continue with IV Ancef  for MSSA septicemia. TEE negative for vegetations. Going to MRI left hip and lumbar spine today.  S/p left elbow hardware removal on 10-23-2023  MSSA bacteremia 10-24-2023 ID following. On IV Ancef . [name] to be determined by ID.  Hypokalemia 10-23-2023 through 10-23-2023. Check magnesium . Repleted in ED. Trend   10-24-2023 resolved. K 4.1 today.  MDS (myelodysplastic syndrome) (HCC) 10-21-2023 through 10-22-2021. -followed by Dr. Rosaline Coma. Diagnosed 12/2021. Myelodysplastic Syndrome, Single Lineage Dysplasia -12/19/2021: Bone marrow biopsy which showed a low-grade myelodysplastic syndrome.  - Started retacrit  20,000 units q 2 weeks  10-24-2023 stable.  GERD (gastroesophageal reflux  disease) 10-24-2023 On TUMs PRN   Coronary artery disease 10-21-2023 through 10-23-2023 Lexiscan  stress test 2/19 was overall low risk with no ischemia identified.   CT cardiac scoring 07/2018 showed coronary calcium  score of 41 (15th percentile). Continue statin/ASA   10-24-2023 stable.  Essential hypertension 10-24-2023 Continue norvasc , hytrin   BPH (benign prostatic hyperplasia) 10-24-2023 Continue flomax  and proscar    Low back pain 10-21-2023 through 10-24-2023 Continue home oxycodone  10mg  5x/day. Continue steroid taper (2 more days). PMP website verified.   10-24-2023 stable. On oxycodone .  RLS (restless legs syndrome) 10-24-2023 Continue requip , gabapentin  and sinemet     DVT prophylaxis: SCDs Start: 10/21/23 1645    Code Status: Full Code Family Communication: no family at bedside Disposition Plan: return home Reason for continuing need for hospitalization: awaiting MRI hip, lumbar spine. Awaiting [name] clearance from ID.  Objective: Vitals:   10/24/23 1015 10/24/23 1030 10/24/23 1040 10/24/23 1121  BP: (!) 117/59 (!) 145/60 (!) 158/68 119/68  Pulse: 65 72  78 (!) 52  Resp: 15 11 18 18   Temp:    97.6 F (36.4 C)  TempSrc:    Oral  SpO2: 96% 95% 95% 95%  Weight:      Height:        Intake/Output Summary (Last 24 hours) at 10/24/2023 1547 Last data filed at 10/24/2023 1006 Gross per 24 hour  Intake 1100 ml  Output --  Net 1100 ml   Filed Weights   10/21/23 1906  Weight: 76.3 kg    Examination:  Physical Exam Vitals and nursing note reviewed.  Constitutional:      General: He is not in acute distress.    Appearance: He is not toxic-appearing or diaphoretic.  HENT:     Head: Normocephalic and atraumatic.     Nose: Nose normal.  Eyes:     General: No scleral icterus. Cardiovascular:     Rate and Rhythm: Normal rate and regular rhythm.  Pulmonary:     Effort: Pulmonary effort is normal.     Breath sounds: Normal breath sounds.  Abdominal:     General:  Bowel sounds are normal. There is no distension.     Palpations: Abdomen is soft.  Musculoskeletal:     Comments: Left arm in sling. +edema of left hand  Skin:    Capillary Refill: Capillary refill takes less than 2 seconds.  Neurological:     Mental Status: He is alert and oriented to person, place, and time.     Data Reviewed: I have personally reviewed following labs and imaging studies  CBC: Recent Labs  Lab 10/21/23 1127 10/22/23 0654 10/23/23 0452 10/24/23 0652  WBC 13.0* 21.4* 15.3* 9.5  NEUTROABS 6.5  --   --   --   HGB 9.0* 9.1* 8.7* 7.8*  HCT 29.9* 29.6* 28.4* 25.2*  MCV 79.1* 78.1* 76.8* 76.6*  PLT 104* 90* 80* 69*   Basic Metabolic Panel: Recent Labs  Lab 10/21/23 1329 10/21/23 1612 10/22/23 1005 10/23/23 0452 10/24/23 0652  NA 137  --  134* 132* 138  K 3.1*  --  3.3* 3.6 4.1  CL 106  --  104 101 106  CO2 23  --  21* 21* 22  GLUCOSE 85  --  175* 112* 149*  BUN 24*  --  19 26* 37*  CREATININE 1.01  --  1.15 1.30* 1.21  CALCIUM  8.6*  --  8.3* 8.2* 8.3*  MG  --  1.7  --  2.5*  --   PHOS  --   --   --  2.3* 3.9   GFR: Estimated Creatinine Clearance: 45.4 mL/min (by C-G formula based on SCr of 1.21 mg/dL). Liver Function Tests: Recent Labs  Lab 10/21/23 1329  AST 16  ALT 11  ALKPHOS 60  BILITOT 1.0  PROT 6.0*  ALBUMIN  3.0*   CBG: Recent Labs  Lab 10/21/23 1008 10/21/23 1348  GLUCAP 92 78   Sepsis Labs: Recent Labs  Lab 10/21/23 1139 10/21/23 1612 10/22/23 1005  PROCALCITON  --  1.21 6.28  LATICACIDVEN 0.7  --   --     Recent Results (from the past 240 hours)  Blood Cultures x 2 sites     Status: Abnormal   Collection Time: 10/21/23 11:27 AM   Specimen: Site Not Specified; Blood  Result Value Ref Range Status   Specimen Description   Final    SITE NOT SPECIFIED Performed at [facility], [address] [place], [place] [address]  Special Requests   Final    BOTTLES DRAWN AEROBIC AND ANAEROBIC Blood  Culture adequate volume Performed at [facility], [address] [place], [place] [address]    Culture  Setup Time   Final    GRAM POSITIVE COCCI IN CLUSTERS IN BOTH AEROBIC AND ANAEROBIC BOTTLES CRITICAL RESULT CALLED TO, READ BACK BY AND VERIFIED WITH: PHARMD G ABBOTT 10/22/2023 @ 0610 BY AB Performed at [facility], [address], [place], [place] [address]    Culture STAPHYLOCOCCUS AUREUS (A)  Final   Report Status 10/24/2023 FINAL  Final   Organism ID, Bacteria STAPHYLOCOCCUS AUREUS  Final      Susceptibility   Staphylococcus aureus - MIC*    CIPROFLOXACIN >=8 RESISTANT Resistant     ERYTHROMYCIN >=8 RESISTANT Resistant     GENTAMICIN <=0.5 SENSITIVE Sensitive     OXACILLIN 0.5 SENSITIVE Sensitive     TETRACYCLINE <=1 SENSITIVE Sensitive     VANCOMYCIN  1 SENSITIVE Sensitive     TRIMETH /SULFA  >=320 RESISTANT Resistant     CLINDAMYCIN  <=0.25 SENSITIVE Sensitive     RIFAMPIN <=0.5 SENSITIVE Sensitive     Inducible Clindamycin  NEGATIVE Sensitive     LINEZOLID 2 SENSITIVE Sensitive     * STAPHYLOCOCCUS AUREUS  Blood Culture ID Panel (Reflexed)     Status: Abnormal   Collection Time: 10/21/23 11:27 AM  Result Value Ref Range Status   Enterococcus faecalis NOT DETECTED NOT DETECTED Final   Enterococcus Faecium NOT DETECTED NOT DETECTED Final   Listeria monocytogenes NOT DETECTED NOT DETECTED Final   Staphylococcus species DETECTED (A) NOT DETECTED Final    Comment: CRITICAL RESULT CALLED TO, READ BACK BY AND VERIFIED WITH: PHARMD G ABBOTT 10/22/2023 @ 0610 BY AB    Staphylococcus aureus (BCID) DETECTED (A) NOT DETECTED Final    Comment: CRITICAL RESULT CALLED TO, READ BACK BY AND VERIFIED WITH: PHARMD G ABBOTT 10/22/2023 @ 0610 BY AB    Staphylococcus epidermidis NOT DETECTED NOT DETECTED Final   Staphylococcus lugdunensis NOT DETECTED NOT DETECTED Final   Streptococcus species NOT DETECTED NOT DETECTED Final   Streptococcus agalactiae NOT DETECTED  NOT DETECTED Final   Streptococcus pneumoniae NOT DETECTED NOT DETECTED Final   Streptococcus pyogenes NOT DETECTED NOT DETECTED Final   A.calcoaceticus-baumannii NOT DETECTED NOT DETECTED Final   Bacteroides fragilis NOT DETECTED NOT DETECTED Final   Enterobacterales NOT DETECTED NOT DETECTED Final   Enterobacter cloacae complex NOT DETECTED NOT DETECTED Final   Escherichia coli NOT DETECTED NOT DETECTED Final   Klebsiella aerogenes NOT DETECTED NOT DETECTED Final   Klebsiella oxytoca NOT DETECTED NOT DETECTED Final   Klebsiella pneumoniae NOT DETECTED NOT DETECTED Final   Proteus species NOT DETECTED NOT DETECTED Final   Salmonella species NOT DETECTED NOT DETECTED Final   Serratia marcescens NOT DETECTED NOT DETECTED Final   Haemophilus influenzae NOT DETECTED NOT DETECTED Final   Neisseria meningitidis NOT DETECTED NOT DETECTED Final   Pseudomonas aeruginosa NOT DETECTED NOT DETECTED Final   Stenotrophomonas maltophilia NOT DETECTED NOT DETECTED Final   Candida albicans NOT DETECTED NOT DETECTED Final   Candida auris NOT DETECTED NOT DETECTED Final   Candida glabrata NOT DETECTED NOT DETECTED Final   Candida krusei NOT DETECTED NOT DETECTED Final   Candida parapsilosis NOT DETECTED NOT DETECTED Final   Candida tropicalis NOT DETECTED NOT DETECTED Final   Cryptococcus neoformans/gattii NOT DETECTED NOT DETECTED Final   Meth resistant mecA/C and MREJ NOT DETECTED NOT DETECTED Final  Comment: Performed at [facility], [address], [place], [place] [address]  Blood Cultures x 2 sites     Status: Abnormal   Collection Time: 10/21/23 12:19 PM   Specimen: BLOOD  Result Value Ref Range Status   Specimen Description   Final    BLOOD BLOOD RIGHT ARM Performed at [facility] [address], [place], [place] [address]    Special Requests   Final    BOTTLES DRAWN AEROBIC AND ANAEROBIC Blood Culture adequate volume Performed at [name], [address], [place], [place] [address]    Culture  Setup Time   Final    GRAM POSITIVE COCCI IN CLUSTERS ANAEROBIC BOTTLE ONLY CRITICAL VALUE NOTED.  VALUE IS CONSISTENT WITH PREVIOUSLY REPORTED AND CALLED VALUE.    Culture (A)  Final    STAPHYLOCOCCUS AUREUS SUSCEPTIBILITIES PERFORMED ON PREVIOUS CULTURE WITHIN THE LAST 5 DAYS. Performed at [facility], [address] [place], [place] [address]    Report Status 10/24/2023 FINAL  Final  Body fluid culture w Gram Stain     Status: None   Collection Time: 10/21/23  1:59 PM   Specimen: Synovium; Synovial Fluid  Result Value Ref Range Status   Specimen Description   Final    SYNOVIAL Performed at [facility], [address], [place], [place] [address]    Special Requests   Final    NONE LEFT ELBOW Performed at [facility], [address] [place], [place] [address]    Gram Stain   Final    ABUNDANT WBC PRESENT,BOTH PMN AND MONONUCLEAR ABUNDANT GRAM POSITIVE COCCI Performed at [facility], [address] [place], [place] [address]    Culture ABUNDANT STAPHYLOCOCCUS AUREUS  Final   Report Status 10/23/2023 FINAL  Final   Organism ID, Bacteria STAPHYLOCOCCUS AUREUS  Final      Susceptibility   Staphylococcus aureus - MIC*    CIPROFLOXACIN >=8 RESISTANT Resistant     ERYTHROMYCIN >=8 RESISTANT Resistant     GENTAMICIN <=0.5 SENSITIVE Sensitive     OXACILLIN 0.5 SENSITIVE Sensitive     TETRACYCLINE <=1 SENSITIVE Sensitive     VANCOMYCIN  <=0.5 SENSITIVE Sensitive     TRIMETH /SULFA  >=320 RESISTANT Resistant     CLINDAMYCIN  <=0.25 SENSITIVE Sensitive     RIFAMPIN <=0.5 SENSITIVE Sensitive     Inducible Clindamycin  NEGATIVE Sensitive     LINEZOLID 2 SENSITIVE Sensitive     * ABUNDANT STAPHYLOCOCCUS AUREUS  Gram stain     Status: None   Collection Time: 10/21/23  1:59 PM   Specimen: Synovium; Body Fluid  Result Value Ref Range Status   Specimen Description  SYNOVIAL  Final   Special Requests LEFT ELBOW  Final   Gram Stain   Final    ABUNDANT WBC SEEN GRAM POSITIVE COCCI Gram Stain Report Called to,Read Back By and Verified With: Alonna Art RN AT 1529 ON 10/21/2023 BY Anibal [place] Performed at [facility], [address], [place], [place] [address]    Report Status 10/21/2023 FINAL  Final  Aerobic/Anaerobic Culture w Gram Stain (surgical/deep wound)     Status: None (Preliminary result)   Collection Time: 10/23/23  2:37 PM   Specimen: Synovial, Left Elbow; Body Fluid  Result Value Ref Range Status   Specimen Description SYNOVIAL  Final   Special Requests SWAB OF LEFT ELBOW SYNOVIAL FLUID  Final   Gram Stain   Final  ABUNDANT WBC PRESENT, PREDOMINANTLY PMN RARE GRAM POSITIVE COCCI IN PAIRS Gram Stain Report Called to,Read Back By and Verified With: RN B. HICKLIN  [id number]@ 2036 FH    Culture   Final    CULTURE REINCUBATED FOR BETTER GROWTH Performed at [facility], [address], [place], [place] [address]    Report Status PENDING  Incomplete  Aerobic/Anaerobic Culture w Gram Stain (surgical/deep wound)     Status: None (Preliminary result)   Collection Time: 10/23/23  3:20 PM   Specimen: Soft Tissue, Other  Result Value Ref Range Status   Specimen Description TISSUE  Final   Special Requests OLECRANON BURSA  Final   Gram Stain   Final    NO WBC SEEN RARE GRAM POSITIVE COCCI IN PAIRS Gram Stain Report Called to,Read Back By and Verified With: RN Janey Meek [phone] @ 1825 FH    Culture   Final    NO GROWTH < 24 HOURS Performed at [facility], [address] [place], [place] [address]    Report Status PENDING  Incomplete  Culture, blood (Routine X 2) w Reflex to ID Panel     Status: None (Preliminary result)   Collection Time: 10/23/23 10:13 PM   Specimen: BLOOD  Result Value Ref Range Status   Specimen Description BLOOD SITE NOT SPECIFIED  Final   Special Requests   Final    BOTTLES DRAWN AEROBIC  AND ANAEROBIC Blood Culture adequate volume   Culture   Final    NO GROWTH < 12 HOURS Performed at [facility], [address] [place], [place] [address]    Report Status PENDING  Incomplete  Culture, blood (Routine X 2) w Reflex to ID Panel     Status: None (Preliminary result)   Collection Time: 10/23/23 10:15 PM   Specimen: BLOOD  Result Value Ref Range Status   Specimen Description BLOOD SITE NOT SPECIFIED  Final   Special Requests   Final    BOTTLES DRAWN AEROBIC AND ANAEROBIC Blood Culture adequate volume   Culture   Final    NO GROWTH < 12 HOURS Performed at [facility], [address] [place], [place] [address]    Report Status PENDING  Incomplete     Radiology Studies: ECHO TEE Result Date: 10/24/2023    TRANSESOPHOGEAL ECHO REPORT   Patient Name:   Justin [name] [facility]. Date of Exam: 10/24/2023 Medical Rec #:  [id number]              Height:       69.0 in Accession #:    [id number]             Weight:       168.2 lb Date of Birth:  1940/11/24              BSA:          1.919 m Patient Age:    82 years               BP:           152/68 mmHg Patient Gender: M                      HR:           63 bpm. Exam Location:  Inpatient Procedure: Transesophageal Echo, 3D Echo, Color Doppler and Cardiac Doppler            (Both  Spectral and Color Flow Doppler were utilized during            procedure). Indications:     Fever  History:         Patient has prior history of Echocardiogram examinations, most                  recent 10/16/2023. CAD; Risk Factors:Hypertension.  Sonographer:     Sherline Distel Senior RDCS Referring Phys:  [id number] Hugh Madura Diagnosing Phys: Dorothye Gathers MD PROCEDURE: After discussion of the risks and benefits of a TEE, an informed consent was obtained from the patient. The transesophogeal probe was passed without difficulty through the esophogus of the patient. Sedation performed by different physician. The patient was monitored while under deep sedation.  Anesthestetic sedation was provided intravenously by Anesthesiology: 193mg  of Propofol , 80mg  of Lidocaine . The patient's vital signs; including heart rate, blood pressure, and oxygen saturation; remained stable throughout the procedure. The patient developed no complications during the procedure.  IMPRESSIONS  1. Left ventricular ejection fraction, by estimation, is 55 to 60%. The left ventricle has normal function. The left ventricle has no regional wall motion abnormalities.  2. Right ventricular systolic function is normal. The right ventricular size is normal.  3. No left atrial/left atrial appendage thrombus was detected.  4. The mitral valve is normal in structure. Mild mitral valve regurgitation. No evidence of mitral stenosis.  5. The aortic valve is tricuspid. Aortic valve regurgitation is not visualized. No aortic stenosis is present.  6. There is Moderate (Grade III) plaque.  7. The inferior vena cava is normal in size with greater than 50% respiratory variability, suggesting right atrial pressure of 3 mmHg.  8. 3D performed of the LAA and demonstrates No thrombus. Conclusion(s)/Recommendation(s): No evidence of vegetation/infective endocarditis on this transesophageael echocardiogram. FINDINGS  Left Ventricle: Left ventricular ejection fraction, by estimation, is 55 to 60%. The left ventricle has normal function. The left ventricle has no regional wall motion abnormalities. The left ventricular internal cavity size was normal in size. There is  no left ventricular hypertrophy. Right Ventricle: The right ventricular size is normal. No increase in right ventricular wall thickness. Right ventricular systolic function is normal. Left Atrium: Left atrial size was normal in size. No left atrial/left atrial appendage thrombus was detected. Right Atrium: Right atrial size was normal in size. Pericardium: There is no evidence of pericardial effusion. Mitral Valve: The mitral valve is normal in structure. Mild  mitral valve regurgitation. No evidence of mitral valve stenosis. Tricuspid Valve: The tricuspid valve is normal in structure. Tricuspid valve regurgitation is trivial. No evidence of tricuspid stenosis. Aortic Valve: The aortic valve is tricuspid. Aortic valve regurgitation is not visualized. No aortic stenosis is present. Pulmonic Valve: The pulmonic valve was normal in structure. Pulmonic valve regurgitation is not visualized. No evidence of pulmonic stenosis. Aorta: The aortic root is normal in size and structure. There is moderate (Grade III) plaque. Venous: The inferior vena cava is normal in size with greater than 50% respiratory variability, suggesting right atrial pressure of 3 mmHg. IAS/Shunts: No atrial level shunt detected by color flow Doppler. Additional Comments: Spectral Doppler performed. Dorothye Gathers MD Electronically signed by Dorothye Gathers MD Signature Date/Time: 10/24/2023/12:13:13 PM    Final    US  EKG SITE RITE Result Date: 10/24/2023 If Site Rite image not attached, placement could not be confirmed due to current cardiac rhythm.  EP STUDY Result Date: 10/24/2023 See surgical note for result.  DG Elbow  2 Views Left Result Date: 10/23/2023 CLINICAL DATA:  Septic joint EXAM: LEFT ELBOW - 2 VIEW COMPARISON:  Left elbow x-ray 10/21/2023. FINDINGS: There is diffuse soft tissue swelling surrounding the elbow. Orthopedic hardware has been removed within the proximal ulna and distal humerus. A single horizontal screw persists in the distal humerus. The bones are diffusely osteopenic. No definite acute fracture identified. There are degenerative changes between the distal humerus and ulna. The radial head appears subluxed/dislocated anteriorly. There is some periosteal reaction along the posterior aspect of the distal humerus. IMPRESSION: 1. Diffuse soft tissue swelling surrounding the elbow. 2. The radial head appears subluxed/dislocated anteriorly. 3. No definite acute fracture identified. 4.  Orthopedic hardware has been removed within the proximal ulna and distal humerus. A single horizontal screw persists in the distal humerus. Electronically Signed   By: Tyron Gallon M.D.   On: 10/23/2023 20:08   DG ELBOW COMPLETE LEFT (3+VIEW) Result Date: 10/23/2023 CLINICAL DATA:  Elective surgery.  Removal of hardware. EXAM: LEFT ELBOW - COMPLETE 3+ VIEW COMPARISON:  Radiograph 10/21/2023 FINDINGS: Four fluoroscopic spot views of the left elbow submitted from the operating room. The previous hardware has been removed except for a single screw in the distal humerus. Fluoroscopic time 20.7 seconds. Dose 1.02 mGy. IMPRESSION: Intraoperative fluoroscopy during hardware removal. Electronically Signed   By: Chadwick Colonel M.D.   On: 10/23/2023 16:18   DG C-Arm 1-60 Min-No Report Result Date: 10/23/2023 Fluoroscopy was utilized by the requesting physician.  No radiographic interpretation.   DG C-Arm 1-60 Min-No Report Result Date: 10/23/2023 Fluoroscopy was utilized by the requesting physician.  No radiographic interpretation.    Scheduled Meds:  acetaminophen   650 mg Oral Q8H   Or   acetaminophen   650 mg Rectal Q8H   amLODipine   5 mg Oral QHS   carbidopa -levodopa   1 tablet Oral QHS   cholecalciferol   3,000 Units Oral Daily   vitamin B-12  2,000 mcg Oral Daily   finasteride   5 mg Oral Daily   gabapentin   300 mg Oral TID   multivitamin  1 tablet Oral BID   oxyCODONE   10 mg Oral 5 X Daily   pravastatin   20 mg Oral Daily   rOPINIRole   1 mg Oral QID   tamsulosin   0.4 mg Oral q AM   terazosin   2 mg Oral QHS   Continuous Infusions:   ceFAZolin  (ANCEF ) IV 2 g (10/24/23 1437)   sodium chloride      sodium PHOSPHATE  IVPB (in mmol)       LOS: 3 days   Time spent: 45 minutes  Unk Garb, DO  [facility]  10/24/2023, 3:47 PM

## 2023-10-25 ENCOUNTER — Inpatient Hospital Stay: Payer: PPO | Admitting: Hematology and Oncology

## 2023-10-25 ENCOUNTER — Inpatient Hospital Stay: Payer: PPO

## 2023-10-25 DIAGNOSIS — B9561 Methicillin susceptible Staphylococcus aureus infection as the cause of diseases classified elsewhere: Secondary | ICD-10-CM | POA: Diagnosis not present

## 2023-10-25 DIAGNOSIS — G062 Extradural and subdural abscess, unspecified: Secondary | ICD-10-CM | POA: Diagnosis not present

## 2023-10-25 DIAGNOSIS — R7881 Bacteremia: Secondary | ICD-10-CM | POA: Diagnosis not present

## 2023-10-25 DIAGNOSIS — G061 Intraspinal abscess and granuloma: Secondary | ICD-10-CM

## 2023-10-25 DIAGNOSIS — M00022 Staphylococcal arthritis, left elbow: Secondary | ICD-10-CM | POA: Diagnosis not present

## 2023-10-25 DIAGNOSIS — I10 Essential (primary) hypertension: Secondary | ICD-10-CM | POA: Diagnosis not present

## 2023-10-25 DIAGNOSIS — B9689 Other specified bacterial agents as the cause of diseases classified elsewhere: Secondary | ICD-10-CM

## 2023-10-25 LAB — CBC
HCT: 23.6 % — ABNORMAL LOW (ref 39.0–52.0)
Hemoglobin: 7.1 g/dL — ABNORMAL LOW (ref 13.0–17.0)
MCH: 22.8 pg — ABNORMAL LOW (ref 26.0–34.0)
MCHC: 30.1 g/dL (ref 30.0–36.0)
MCV: 75.9 fL — ABNORMAL LOW (ref 80.0–100.0)
Platelets: 106 10*3/uL — ABNORMAL LOW (ref 150–400)
RBC: 3.11 MIL/uL — ABNORMAL LOW (ref 4.22–5.81)
RDW: 17.8 % — ABNORMAL HIGH (ref 11.5–15.5)
WBC: 11.7 10*3/uL — ABNORMAL HIGH (ref 4.0–10.5)
nRBC: 0 % (ref 0.0–0.2)

## 2023-10-25 MED ORDER — CHLORHEXIDINE GLUCONATE CLOTH 2 % EX PADS
6.0000 | MEDICATED_PAD | Freq: Every day | CUTANEOUS | Status: DC
  Administered 2023-10-25 – 2023-10-26 (×2): 6 via TOPICAL

## 2023-10-25 MED ORDER — SODIUM CHLORIDE 0.9% FLUSH
10.0000 mL | Freq: Two times a day (BID) | INTRAVENOUS | Status: DC
  Administered 2023-10-25 – 2023-10-26 (×3): 10 mL

## 2023-10-25 MED ORDER — SODIUM CHLORIDE 0.9% FLUSH: 10.0000 mL | INTRAVENOUS | Status: DC | PRN

## 2023-10-25 NOTE — Progress Notes (Signed)
 PROGRESS NOTE    Justin [name] [place].  UEA:[id number] DOB: 07/30/1940 DOA: 10/21/2023 PCP: Genia Kettering, MD  Subjective: Pt seen and examined.  MRI lumbar spine showed L4-S1 epidural abscess. ID requested neurosurgery consulted. Discussed with Dr. Larrie Po with neurosurgery.  Met with pt and dtr Justin [name] at bedside. Family plans on pt returning home with them.   Hospital Course: HPI: Justin [name]. is a 83 y.o. male with medical history significant of HTN, CAD, BPH, hx of TIAs, DDD/chronic low back pain, MDS, thrombocytopenia, depression who presented to ED with complaints of acute on chronic left elbow pain. He started to have pain out of the ordinary last night around 8pm. He denies any trauma. This morning he called his daughter and she went over to his house. He was to weak and in too much pain to even get into the car so she called EMS. He can not move his left elbow at all. Pain 10/10. Pain radiates down his entire hand. He denies any fever, chilled at times. NO N/V. It is warm to touch, not really sure it's changed in color.    He has history of an open fracture in his left elbow in 2007 and had surgery. About 4 years ago one of the pins came lose and this was taken out. At baseline now he can not fully extend or flex to 90 degrees but has some movement in it. He can't used his hands at all. He states his hands are like a claw. He is right handed.      He has been feeling good. Denies any fever/chills, vision changes/headaches, chest pain or palpitations, shortness of breath or cough, abdominal pain, N/V/D, dysuria or leg swelling.      Denies any fever, vision changes/headaches, chest pain or palpitations, shortness of breath or cough, abdominal pain, N/V/D, dysuria or leg swelling.    He does not smoke or drink alcohol .    ER Course:  vitals: afebrile, bp: 133/69, HR; 78, RR: 16, oxygen: 94% Pertinent labs: WBC: 13, hgb: 9.0, platelets 104, potassium: 3.1,  Left  elbow xray: Remote distal humeral and proximal olecranon plate and screw fracture fixation. No evidence of hardware failure. 2. 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 3. There is a linear 22 x less than 1 mm metallic possible needle within the soft tissues medial to the proximal diaphysis of the ulna, appearing within approximately 1.2 cm of the medial proximal forearm skin surface. Recommend clinical correlation. In ED: given potassium, cefepime  and vanc and BC obtained. Lactic acid wnl. Fluid obtained from elbow. TRH asked to admit.   Significant Events: Admitted 10/21/2023 acute on chronic left elbow pain   Significant Labs: WBC 13.0, HgB 9.0, plt 104 ESR 12 CRP 9.3 Blood cx 4-20 Staph Aureus. MSSA  Significant Imaging Studies: Left elbow XR Remote distal humeral and proximal olecranon plate and screw fracture fixation. No evidence of hardware failure. 2. 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 3. There is a linear 22 x less than 1 mm metallic possible needle within the soft tissues medial to the proximal diaphysis of the ulna, appearing within approximately 1.2 cm of the medial proximal forearm skin surface. Recommend clinical correlation.   Antibiotic Therapy: Anti-infectives (From admission, onward)    Start     Dose/Rate Route Frequency Ordered Stop   10/22/23 1700  vancomycin  (VANCOREADY) IVPB 1250 mg/250 mL  Status:  Discontinued        1,250 mg 166.7 mL/hr over 90  Minutes Intravenous Every 24 hours 10/21/23 1914 10/22/23 0619   10/22/23 0715  ceFAZolin  (ANCEF ) IVPB 2g/100 mL premix        2 g 200 mL/hr over 30 Minutes Intravenous Every 8 hours 10/22/23 0619     10/21/23 2000  cefTRIAXone  (ROCEPHIN ) 2 g in sodium chloride  0.9 % 100 mL IVPB  Status:  Discontinued        2 g 200 mL/hr over 30 Minutes Intravenous Every 24 hours 10/21/23 1907 10/22/23 0619   10/21/23 1530  vancomycin  (VANCOREADY) IVPB 1500 mg/300 mL         1,500 mg 150 mL/hr over 120 Minutes Intravenous  Once 10/21/23 1519 10/21/23 1818   10/21/23 1530  ceFEPIme  (MAXIPIME ) 2 g in sodium chloride  0.9 % 100 mL IVPB        2 g 200 mL/hr over 30 Minutes Intravenous  Once 10/21/23 1519 10/21/23 1649       Procedures: 10-24-2023 I&D left elbow 10-25-2023 TEE  Consultants: Orthopedics ID    Assessment and Plan: * Acute on chronic pain of left elbow concerning for septic arthritis 10-21-2023 through 10-24-2023. 83 year old presenting to ED with acute onset of pain in his left elbow, decreased mobility, warm to touch and labs concerning for possibly infected joint. He has history of an open fracture in his left elbow in 2007 and had surgery with hardware. About 4 years ago one of the pins came lose and this was taken out.  -admit to [facility] -ortho consulted (Dr. Bernard Brick)  -joint aspiration obtained by EDP and described as purulent. Gram stain showing gram positive cocci -continue vancomycin . Change cefepime  to rocephin   -check inflammatory markers -check uric acid  -no hx of gout or pseudo gout, but follow synovial fluid for crystals  -trend PCT  -CT/other imaging per ortho  -pain control difficult. Continue home oxycodone  five times/day. Morphine  q 2 hours for severe pain, may need dilaudid , but transferring to [facility].   10-24-2023 continue with IV Ancef  for MSSA septicemia. TEE negative for vegetations. Going to MRI left hip and lumbar spine today.  S/p left elbow hardware removal on 10-23-2023  10-25-2023 awaiting [name] and PICC orders from ID. Family plans on taking pt home. Discussed with pt's dtr Justin [name]  MSSA bacteremia 10-24-2023 ID following. On IV Ancef . [name] to be determined by ID.  10-25-2023 awaiting [name] and PICC orders from ID. Family plans on taking pt home. Discussed with pt's dtr Justin [name]   Abscess in epidural space of lumbar spine 10-25-2023 lumbar epidural abscess L4-S1 seen on MRI lumbar spine. ID requested Neurosurgery consult.  Discussed with Dr. Larrie Po with NS. He does not think pt will need operative intervention at this time but will see patient in consultation today. Dtr Justin [name] aware of epidural abscess.  Hypokalemia 10-23-2023 through 10-23-2023. Check magnesium . Repleted in ED. Trend   10-24-2023 resolved. K 4.1 today.  MDS (myelodysplastic syndrome) (HCC) 10-21-2023 through 10-22-2021. -followed by Dr. Rosaline Coma. Diagnosed 12/2021. Myelodysplastic Syndrome, Single Lineage Dysplasia -12/19/2021: Bone marrow biopsy which showed a low-grade myelodysplastic syndrome.  - Started retacrit  20,000 units q 2 weeks  10-24-2023 stable.  10-25-2023 stable.  GERD (gastroesophageal reflux disease) 10-24-2023 On TUMs PRN   10-25-2023 stable.  Coronary artery disease 10-21-2023 through 10-23-2023 Lexiscan  stress test 2/19 was overall low risk with no ischemia identified.   CT cardiac scoring 07/2018 showed coronary calcium  score of 41 (15th percentile). Continue statin/ASA   10-24-2023 stable.  10-25-2023 stable.  Essential hypertension 10-24-2023 Continue norvasc ,  hytrin   10-25-2023 stable.  BPH (benign prostatic hyperplasia) 10-24-2023 Continue flomax  and proscar    10-25-2023 stable.  Low back pain 10-21-2023 through 10-24-2023 Continue home oxycodone  10mg  5x/day. Continue steroid taper (2 more days). PMP website verified.   10-24-2023 stable. On oxycodone .  10-25-2023 due to epidural abscess. Continue oxycodone .  RLS (restless legs syndrome) 10-24-2023 Continue requip , gabapentin  and sinemet    10-25-2023 stable.   DVT prophylaxis: SCDs Start: 10/21/23 1645    Code Status: Full Code Family Communication: discussed with pt and dtr Justin [name] at bedside Disposition Plan: return home Reason for continuing need for hospitalization: awaiting [name] orders and PICC placement.  Objective: Vitals:   10/24/23 2100 10/24/23 2110 10/25/23 0414 10/25/23 0818  BP: 119/60 119/60 120/61 (!) 155/59  Pulse:   67 60  Resp:    18 18  Temp:   97.6 F (36.4 C) 97.7 F (36.5 C)  TempSrc:   Oral   SpO2:   96% 98%  Weight:      Height:        Intake/Output Summary (Last 24 hours) at 10/25/2023 1157 Last data filed at 10/25/2023 0936 Gross per 24 hour  Intake 1036.89 ml  Output 250 ml  Net 786.89 ml   Filed Weights   10/21/23 1906  Weight: 76.3 kg    Examination:  Physical Exam Vitals and nursing note reviewed.  Constitutional:      General: He is not in acute distress.    Appearance: He is not toxic-appearing or diaphoretic.     Comments: Pt joking and laughing. In good spirits. Dtr Justin [name] at bedside.  HENT:     Head: Normocephalic and atraumatic.  Eyes:     General: No scleral icterus. Cardiovascular:     Rate and Rhythm: Normal rate and regular rhythm.  Pulmonary:     Effort: Pulmonary effort is normal. No respiratory distress.     Breath sounds: Normal breath sounds. No wheezing.  Abdominal:     General: Bowel sounds are normal. There is no distension.     Palpations: Abdomen is soft.  Musculoskeletal:     Right lower leg: No edema.     Left lower leg: No edema.     Comments: Right hand and forearm edema. Unchanged. Right UE in sling.  Skin:    General: Skin is warm and dry.     Capillary Refill: Capillary refill takes less than 2 seconds.  Neurological:     Mental Status: He is alert and oriented to person, place, and time.     Data Reviewed: I have personally reviewed following labs and imaging studies  CBC: Recent Labs  Lab 10/21/23 1127 10/22/23 0654 10/23/23 0452 10/24/23 0652 10/25/23 0656  WBC 13.0* 21.4* 15.3* 9.5 11.7*  NEUTROABS 6.5  --   --   --   --   HGB 9.0* 9.1* 8.7* 7.8* 7.1*  HCT 29.9* 29.6* 28.4* 25.2* 23.6*  MCV 79.1* 78.1* 76.8* 76.6* 75.9*  PLT 104* 90* 80* 69* 106*   Basic Metabolic Panel: Recent Labs  Lab 10/21/23 1329 10/21/23 1612 10/22/23 1005 10/23/23 0452 10/24/23 0652  NA 137  --  134* 132* 138  K 3.1*  --  3.3* 3.6 4.1  CL 106  --   104 101 106  CO2 23  --  21* 21* 22  GLUCOSE 85  --  175* 112* 149*  BUN 24*  --  19 26* 37*  CREATININE 1.01  --  1.15 1.30* 1.21  CALCIUM  8.6*  --  8.3* 8.2* 8.3*  MG  --  1.7  --  2.5*  --   PHOS  --   --   --  2.3* 3.9   GFR: Estimated Creatinine Clearance: 45.4 mL/min (by C-G formula based on SCr of 1.21 mg/dL). Liver Function Tests: Recent Labs  Lab 10/21/23 1329  AST 16  ALT 11  ALKPHOS 60  BILITOT 1.0  PROT 6.0*  ALBUMIN  3.0*   CBG: Recent Labs  Lab 10/21/23 1008 10/21/23 1348  GLUCAP 92 78   Sepsis Labs: Recent Labs  Lab 10/21/23 1139 10/21/23 1612 10/22/23 1005  PROCALCITON  --  1.21 6.28  LATICACIDVEN 0.7  --   --     Recent Results (from the past 240 hours)  Blood Cultures x 2 sites     Status: Abnormal   Collection Time: 10/21/23 11:27 AM   Specimen: Site Not Specified; Blood  Result Value Ref Range Status   Specimen Description   Final    SITE NOT SPECIFIED Performed at [facility], [address], [place], [place] [address]    Special Requests   Final    BOTTLES DRAWN AEROBIC AND ANAEROBIC Blood Culture adequate volume Performed at [facility], [address], [place], [place] [address]    Culture  Setup Time   Final    GRAM POSITIVE COCCI IN CLUSTERS IN BOTH AEROBIC AND ANAEROBIC BOTTLES CRITICAL RESULT CALLED TO, READ BACK BY AND VERIFIED WITH: PHARMD G ABBOTT 10/22/2023 @ 0610 BY AB Performed at [facility], [address], [place], [place] [address]    Culture STAPHYLOCOCCUS AUREUS (A)  Final   Report Status 10/24/2023 FINAL  Final   Organism ID, Bacteria STAPHYLOCOCCUS AUREUS  Final      Susceptibility   Staphylococcus aureus - MIC*    CIPROFLOXACIN >=8 RESISTANT Resistant     ERYTHROMYCIN >=8 RESISTANT Resistant     GENTAMICIN <=0.5 SENSITIVE Sensitive     OXACILLIN 0.5 SENSITIVE Sensitive     TETRACYCLINE <=1 SENSITIVE Sensitive     VANCOMYCIN  1 SENSITIVE Sensitive      TRIMETH /SULFA  >=320 RESISTANT Resistant     CLINDAMYCIN  <=0.25 SENSITIVE Sensitive     RIFAMPIN <=0.5 SENSITIVE Sensitive     Inducible Clindamycin  NEGATIVE Sensitive     LINEZOLID 2 SENSITIVE Sensitive     * STAPHYLOCOCCUS AUREUS  Blood Culture ID Panel (Reflexed)     Status: Abnormal   Collection Time: 10/21/23 11:27 AM  Result Value Ref Range Status   Enterococcus faecalis NOT DETECTED NOT DETECTED Final   Enterococcus Faecium NOT DETECTED NOT DETECTED Final   Listeria monocytogenes NOT DETECTED NOT DETECTED Final   Staphylococcus species DETECTED (A) NOT DETECTED Final    Comment: CRITICAL RESULT CALLED TO, READ BACK BY AND VERIFIED WITH: PHARMD G ABBOTT 10/22/2023 @ 0610 BY AB    Staphylococcus aureus (BCID) DETECTED (A) NOT DETECTED Final    Comment: CRITICAL RESULT CALLED TO, READ BACK BY AND VERIFIED WITH: PHARMD G ABBOTT 10/22/2023 @ 0610 BY AB    Staphylococcus epidermidis NOT DETECTED NOT DETECTED Final   Staphylococcus lugdunensis NOT DETECTED NOT DETECTED Final   Streptococcus species NOT DETECTED NOT DETECTED Final   Streptococcus agalactiae NOT DETECTED NOT DETECTED Final   Streptococcus pneumoniae NOT DETECTED NOT DETECTED Final   Streptococcus pyogenes NOT DETECTED NOT DETECTED Final   A.calcoaceticus-baumannii NOT DETECTED NOT DETECTED Final   Bacteroides fragilis NOT DETECTED NOT DETECTED Final   Enterobacterales NOT DETECTED  NOT DETECTED Final   Enterobacter cloacae complex NOT DETECTED NOT DETECTED Final   Escherichia coli NOT DETECTED NOT DETECTED Final   Klebsiella aerogenes NOT DETECTED NOT DETECTED Final   Klebsiella oxytoca NOT DETECTED NOT DETECTED Final   Klebsiella pneumoniae NOT DETECTED NOT DETECTED Final   Proteus species NOT DETECTED NOT DETECTED Final   Salmonella species NOT DETECTED NOT DETECTED Final   Serratia marcescens NOT DETECTED NOT DETECTED Final   Haemophilus influenzae NOT DETECTED NOT DETECTED Final   Neisseria meningitidis NOT  DETECTED NOT DETECTED Final   Pseudomonas aeruginosa NOT DETECTED NOT DETECTED Final   Stenotrophomonas maltophilia NOT DETECTED NOT DETECTED Final   Candida albicans NOT DETECTED NOT DETECTED Final   Candida auris NOT DETECTED NOT DETECTED Final   Candida glabrata NOT DETECTED NOT DETECTED Final   Candida krusei NOT DETECTED NOT DETECTED Final   Candida parapsilosis NOT DETECTED NOT DETECTED Final   Candida tropicalis NOT DETECTED NOT DETECTED Final   Cryptococcus neoformans/gattii NOT DETECTED NOT DETECTED Final   Meth resistant mecA/C and MREJ NOT DETECTED NOT DETECTED Final    Comment: Performed at [facility], [address], [place], [place] [address]  Blood Cultures x 2 sites     Status: Abnormal   Collection Time: 10/21/23 12:19 PM   Specimen: BLOOD  Result Value Ref Range Status   Specimen Description   Final    BLOOD BLOOD RIGHT ARM Performed at [facility], [address], [place], [place] [address]    Special Requests   Final    BOTTLES DRAWN AEROBIC AND ANAEROBIC Blood Culture adequate volume Performed at [facility], [address], [place], [place] [address]    Culture  Setup Time   Final    GRAM POSITIVE COCCI IN CLUSTERS ANAEROBIC BOTTLE ONLY CRITICAL VALUE NOTED.  VALUE IS CONSISTENT WITH PREVIOUSLY REPORTED AND CALLED VALUE.    Culture (A)  Final    STAPHYLOCOCCUS AUREUS SUSCEPTIBILITIES PERFORMED ON PREVIOUS CULTURE WITHIN THE LAST 5 DAYS. Performed at [facility], [address], [place], [place] [address]    Report Status 10/24/2023 FINAL  Final  Body fluid culture w Gram Stain     Status: None   Collection Time: 10/21/23  1:59 PM   Specimen: Synovium; Synovial Fluid  Result Value Ref Range Status   Specimen Description   Final    SYNOVIAL Performed at [facility], [address], [place], [place] [address]    Special Requests   Final    NONE LEFT ELBOW Performed at [facility], [address], [place], [place] [address]    Gram Stain   Final    ABUNDANT WBC PRESENT,BOTH PMN AND MONONUCLEAR ABUNDANT GRAM POSITIVE COCCI Performed at [facility], [address], [place], [place] [address]    Culture ABUNDANT STAPHYLOCOCCUS AUREUS  Final   Report Status 10/23/2023 FINAL  Final   Organism ID, Bacteria STAPHYLOCOCCUS AUREUS  Final      Susceptibility   Staphylococcus aureus - MIC*    CIPROFLOXACIN >=8 RESISTANT Resistant     ERYTHROMYCIN >=8 RESISTANT Resistant     GENTAMICIN <=0.5 SENSITIVE Sensitive     OXACILLIN 0.5 SENSITIVE Sensitive     TETRACYCLINE <=1 SENSITIVE Sensitive     VANCOMYCIN  <=0.5 SENSITIVE Sensitive     TRIMETH /SULFA  >=320 RESISTANT Resistant     CLINDAMYCIN  <=0.25 SENSITIVE Sensitive     RIFAMPIN <=0.5 SENSITIVE Sensitive  Inducible Clindamycin  NEGATIVE Sensitive     LINEZOLID 2 SENSITIVE Sensitive     * ABUNDANT STAPHYLOCOCCUS AUREUS  Gram stain     Status: None   Collection Time: 10/21/23  1:59 PM   Specimen: Synovium; Body Fluid  Result Value Ref Range Status   Specimen Description SYNOVIAL  Final   Special Requests LEFT ELBOW  Final   Gram Stain   Final    ABUNDANT WBC SEEN GRAM POSITIVE COCCI Gram Stain Report Called to,Read Back By and Verified With: Alonna Art RN AT 1529 ON 10/21/2023 BY Anibal [place] Performed at [facility] [address] [place], [place] [address]    Report Status 10/21/2023 FINAL  Final  Aerobic/Anaerobic Culture w Gram Stain (surgical/deep wound)     Status: None (Preliminary result)   Collection Time: 10/23/23  2:37 PM   Specimen: Synovial, Left Elbow; Body Fluid  Result Value Ref Range Status   Specimen Description SYNOVIAL  Final   Special Requests SWAB OF LEFT ELBOW SYNOVIAL FLUID  Final   Gram Stain   Final    ABUNDANT WBC PRESENT, PREDOMINANTLY PMN RARE GRAM POSITIVE COCCI IN PAIRS Gram Stain Report Called to,Read Back By and Verified With: RN B.  HICKLIN  [id number]@ 2036 FH    Culture   Final    MODERATE STAPHYLOCOCCUS AUREUS SUSCEPTIBILITIES TO FOLLOW Performed at [facility], [address], [place], [place] [address]    Report Status PENDING  Incomplete  Aerobic/Anaerobic Culture w Gram Stain (surgical/deep wound)     Status: None (Preliminary result)   Collection Time: 10/23/23  3:20 PM   Specimen: Soft Tissue, Other  Result Value Ref Range Status   Specimen Description TISSUE  Final   Special Requests OLECRANON BURSA  Final   Gram Stain   Final    NO WBC SEEN RARE GRAM POSITIVE COCCI IN PAIRS Gram Stain Report Called to,Read Back By and Verified With: RN Janey Meek [phone] @ [phone] FH    Culture   Final    RARE STAPHYLOCOCCUS AUREUS RARE STAPHYLOCOCCUS EPIDERMIDIS SUSCEPTIBILITIES TO FOLLOW Performed at [facility], [address], [place], [place] [address]    Report Status PENDING  Incomplete  Culture, blood (Routine X 2) w Reflex to ID Panel     Status: None (Preliminary result)   Collection Time: 10/23/23 10:13 PM   Specimen: BLOOD  Result Value Ref Range Status   Specimen Description BLOOD SITE NOT SPECIFIED  Final   Special Requests   Final    BOTTLES DRAWN AEROBIC AND ANAEROBIC Blood Culture adequate volume   Culture   Final    NO GROWTH 2 DAYS Performed at [facility], [address], [place], [place] [address]    Report Status PENDING  Incomplete  Culture, blood (Routine X 2) w Reflex to ID Panel     Status: None (Preliminary result)   Collection Time: 10/23/23 10:15 PM   Specimen: BLOOD  Result Value Ref Range Status   Specimen Description BLOOD SITE NOT SPECIFIED  Final   Special Requests   Final    BOTTLES DRAWN AEROBIC AND ANAEROBIC Blood Culture adequate volume   Culture   Final    NO GROWTH 2 DAYS Performed at [facility], [address] [place], [place] [address]    Report Status PENDING  Incomplete     Radiology Studies: MR Lumbar Spine W Wo Contrast Result  Date: 10/24/2023 CLINICAL DATA:  Back  pain. EXAM: MRI LUMBAR SPINE WITHOUT AND WITH CONTRAST TECHNIQUE: Multiplanar and multiecho pulse sequences of the lumbar spine were obtained without and with intravenous contrast. CONTRAST:  8mL GADAVIST  GADOBUTROL  1 MMOL/ML IV SOLN COMPARISON:  None Available. FINDINGS: Segmentation:  Standard. Alignment: Dextroscoliosis with apex at L4. Grade 1 retrolisthesis at L3-4 and L4-5 and grade 1 anterolisthesis at L5-S1. Vertebrae: There is opposing endplate edema at Z6-1 with a small amount of fluid within the posterior disc space. There is also endplate edema at W96-04, L4-5 and L5-S1. There is L5-S1 facet edema. No acute fracture. Conus medullaris and cauda equina: Conus extends to the T12 level. Conus and cauda equina appear normal. Paraspinal and other soft tissues: There is a left eccentric and dorsal heterogeneous and peripherally contrast-enhancing collection that extends from L4-S1. Small fluid collections within the left dorsal paraspinous muscles. There is edema within the left psoas muscles with an area of probable phlegmonous change. Disc levels: T12-L1: Unremarkable. L1-L2: Small disc bulge with endplate spurring. No spinal canal stenosis. Severe right neural foraminal stenosis. L2-L3: Small disc bulge with bilateral endplate spurring. No spinal canal stenosis. Mild right neural foraminal stenosis. L3-L4: Left asymmetric disc bulge with endplate spurring. Left lateral recess narrowing without central spinal canal stenosis. Mild right and moderate left neural foraminal stenosis. L4-L5: Small ventral component of the above described fluid collection. Left lateral recess narrowing without central spinal canal stenosis. Mass effect on the left L5 nerve root in the lateral recess. Severe left neural foraminal stenosis. L5-S1: Moderate facet arthrosis with left facet edema. As above, heterogeneous collection along the dorsal and left side of the epidural space. There is  narrowing of the left lateral recess with mass effect on the left S1 nerve root. No central spinal canal stenosis. Moderate right and severe left neural foraminal stenosis. IMPRESSION: 1. Findings concerning for septic arthritis at the left L5-S1 facet. 2. 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 3. Edema within the left psoas muscles with an area of probable phlegmonous change. Small fluid collections in the left dorsal paraspinous muscles, consistent with myositis. 4. Severe right L1-2, left L4-5 and left L5-S1 neural foraminal stenosis. 5. Left lateral recess narrowing at L4-5 and L5-S1 with mass effect on the left L5 and S1 nerve roots. Electronically Signed   By: Juanetta Nordmann M.D.   On: 10/24/2023 22:06   MR HIP LEFT W WO CONTRAST Result Date: 10/24/2023 CLINICAL DATA:  Concern for septic arthritis. Known MSSA bacteremia. EXAM: MRI OF THE LEFT HIP WITHOUT AND WITH CONTRAST TECHNIQUE: Multiplanar, multisequence MR imaging was performed both before and after administration of intravenous contrast. CONTRAST:  8mL GADAVIST  GADOBUTROL  1 MMOL/ML IV SOLN COMPARISON:  None Available. FINDINGS: Soft tissue and Muscle: There is diffuse subcutaneous edema extending along the visualized lower abdominal wall and into the bilateral proximal hips. There is relatively symmetric bilateral diffuse intramuscular and interstitial edema of the visualized proximal thighs, most notably involving the bilateral gluteus medius, adductor and anterior compartment musculature. No significant associated enhancement. No discrete loculated intramuscular collection identified.Hamstring tendon origins are intact.Gluteal cuff tendon insertions are intact.Trace pelvic free fluid. Bones/Hip: No acute osseous abnormality. Femoral heads are seated within the acetabula. No hip joint effusion. No periarticular edema or enhancement. Diffuse red marrow reconversion. IMPRESSION: 1. No evidence of septic arthritis of the left hip. No acute  osseous abnormality. 2. Diffuse subcutaneous edema of the visualized lower abdominal wall extending through the bilateral proximal hips with symmetric bilateral relatively diffuse intramuscular and interstitial  edema of the proximal thighs. No evidence of enhancement. No fluid collection. These findings are nonspecific and could reflect systemic inflammatory response in the setting of known MSSA bacteremia, non-suppurative myositis, or volume overload. 3. Trace nonspecific pelvic free fluid. Electronically Signed   By: Mannie Seek M.D.   On: 10/24/2023 20:10   ECHO TEE Result Date: 10/24/2023    TRANSESOPHOGEAL ECHO REPORT   Patient Name:   Justin [name] [facility]. Date of Exam: 10/24/2023 Medical Rec #:  [id number]              Height:       69.0 in Accession #:    [id number]             Weight:       168.2 lb Date of Birth:  Feb 16, 1941              BSA:          1.919 m Patient Age:    82 years               BP:           152/68 mmHg Patient Gender: M                      HR:           63 bpm. Exam Location:  Inpatient Procedure: Transesophageal Echo, 3D Echo, Color Doppler and Cardiac Doppler            (Both Spectral and Color Flow Doppler were utilized during            procedure). Indications:     Fever  History:         Patient has prior history of Echocardiogram examinations, most                  recent 10/16/2023. CAD; Risk Factors:Hypertension.  Sonographer:     Sherline Distel Senior RDCS Referring Phys:  [id number] Hugh Madura Diagnosing Phys: Dorothye Gathers MD PROCEDURE: After discussion of the risks and benefits of a TEE, an informed consent was obtained from the patient. The transesophogeal probe was passed without difficulty through the esophogus of the patient. Sedation performed by different physician. The patient was monitored while under deep sedation. Anesthestetic sedation was provided intravenously by Anesthesiology: 193mg  of Propofol , 80mg  of Lidocaine . The patient's vital signs; including heart rate,  blood pressure, and oxygen saturation; remained stable throughout the procedure. The patient developed no complications during the procedure.  IMPRESSIONS  1. Left ventricular ejection fraction, by estimation, is 55 to 60%. The left ventricle has normal function. The left ventricle has no regional wall motion abnormalities.  2. Right ventricular systolic function is normal. The right ventricular size is normal.  3. No left atrial/left atrial appendage thrombus was detected.  4. The mitral valve is normal in structure. Mild mitral valve regurgitation. No evidence of mitral stenosis.  5. The aortic valve is tricuspid. Aortic valve regurgitation is not visualized. No aortic stenosis is present.  6. There is Moderate (Grade III) plaque.  7. The inferior vena cava is normal in size with greater than 50% respiratory variability, suggesting right atrial pressure of 3 mmHg.  8. 3D performed of the LAA and demonstrates No thrombus. Conclusion(s)/Recommendation(s): No evidence of vegetation/infective endocarditis on this transesophageael echocardiogram. FINDINGS  Left Ventricle: Left ventricular ejection fraction, by estimation, is 55 to 60%. The left ventricle has normal function. The left ventricle has no regional  wall motion abnormalities. The left ventricular internal cavity size was normal in size. There is  no left ventricular hypertrophy. Right Ventricle: The right ventricular size is normal. No increase in right ventricular wall thickness. Right ventricular systolic function is normal. Left Atrium: Left atrial size was normal in size. No left atrial/left atrial appendage thrombus was detected. Right Atrium: Right atrial size was normal in size. Pericardium: There is no evidence of pericardial effusion. Mitral Valve: The mitral valve is normal in structure. Mild mitral valve regurgitation. No evidence of mitral valve stenosis. Tricuspid Valve: The tricuspid valve is normal in structure. Tricuspid valve regurgitation is  trivial. No evidence of tricuspid stenosis. Aortic Valve: The aortic valve is tricuspid. Aortic valve regurgitation is not visualized. No aortic stenosis is present. Pulmonic Valve: The pulmonic valve was normal in structure. Pulmonic valve regurgitation is not visualized. No evidence of pulmonic stenosis. Aorta: The aortic root is normal in size and structure. There is moderate (Grade III) plaque. Venous: The inferior vena cava is normal in size with greater than 50% respiratory variability, suggesting right atrial pressure of 3 mmHg. IAS/Shunts: No atrial level shunt detected by color flow Doppler. Additional Comments: Spectral Doppler performed. Dorothye Gathers MD Electronically signed by Dorothye Gathers MD Signature Date/Time: 10/24/2023/12:13:13 PM    Final    US  EKG SITE RITE Result Date: 10/24/2023 If Site Rite image not attached, placement could not be confirmed due to current cardiac rhythm.  EP STUDY Result Date: 10/24/2023 See surgical note for result.  DG Elbow 2 Views Left Result Date: 10/23/2023 CLINICAL DATA:  Septic joint EXAM: LEFT ELBOW - 2 VIEW COMPARISON:  Left elbow x-ray 10/21/2023. FINDINGS: There is diffuse soft tissue swelling surrounding the elbow. Orthopedic hardware has been removed within the proximal ulna and distal humerus. A single horizontal screw persists in the distal humerus. The bones are diffusely osteopenic. No definite acute fracture identified. There are degenerative changes between the distal humerus and ulna. The radial head appears subluxed/dislocated anteriorly. There is some periosteal reaction along the posterior aspect of the distal humerus. IMPRESSION: 1. Diffuse soft tissue swelling surrounding the elbow. 2. The radial head appears subluxed/dislocated anteriorly. 3. No definite acute fracture identified. 4. Orthopedic hardware has been removed within the proximal ulna and distal humerus. A single horizontal screw persists in the distal humerus. Electronically Signed    By: Tyron Gallon M.D.   On: 10/23/2023 20:08   DG ELBOW COMPLETE LEFT (3+VIEW) Result Date: 10/23/2023 CLINICAL DATA:  Elective surgery.  Removal of hardware. EXAM: LEFT ELBOW - COMPLETE 3+ VIEW COMPARISON:  Radiograph 10/21/2023 FINDINGS: Four fluoroscopic spot views of the left elbow submitted from the operating room. The previous hardware has been removed except for a single screw in the distal humerus. Fluoroscopic time 20.7 seconds. Dose 1.02 mGy. IMPRESSION: Intraoperative fluoroscopy during hardware removal. Electronically Signed   By: Chadwick Colonel M.D.   On: 10/23/2023 16:18   DG C-Arm 1-60 Min-No Report Result Date: 10/23/2023 Fluoroscopy was utilized by the requesting physician.  No radiographic interpretation.   DG C-Arm 1-60 Min-No Report Result Date: 10/23/2023 Fluoroscopy was utilized by the requesting physician.  No radiographic interpretation.    Scheduled Meds:  acetaminophen   650 mg Oral Q8H   Or   acetaminophen   650 mg Rectal Q8H   amLODipine   5 mg Oral QHS   carbidopa -levodopa   1 tablet Oral QHS   cholecalciferol   3,000 Units Oral Daily   vitamin B-12  2,000 mcg Oral Daily  finasteride   5 mg Oral Daily   gabapentin   300 mg Oral TID   multivitamin  1 tablet Oral BID   oxyCODONE   10 mg Oral 5 X Daily   pravastatin   20 mg Oral Daily   rOPINIRole   1 mg Oral QID   tamsulosin   0.4 mg Oral q AM   terazosin   2 mg Oral QHS   Continuous Infusions:   ceFAZolin  (ANCEF ) IV 2 g (10/25/23 0632)   sodium chloride      sodium PHOSPHATE  IVPB (in mmol)       LOS: 4 days   Time spent: 45 minutes  Unk Garb, DO  [facility]  10/25/2023, 11:57 AM

## 2023-10-25 NOTE — Progress Notes (Signed)
 [facility]    Date of Admission:  10/21/2023     ID: Justin Kingdom Victorio Creeden. is a 83 y.o. male with   Principal Problem:   Acute on chronic pain of left elbow concerning for septic arthritis Active Problems:   RLS (restless legs syndrome)   Low back pain   BPH (benign prostatic hyperplasia)   Essential hypertension   Coronary artery disease   GERD (gastroesophageal reflux disease)   MDS (myelodysplastic syndrome) (HCC)   Hypokalemia   MSSA bacteremia   Abscess in epidural space of lumbar spine    Subjective: Still some left elbow pain; underwent imaging of spin showing epidural abscess at L5-S1 L >R. Left elbow remained wrapped.  Medications:   acetaminophen   650 mg Oral Q8H   Or   acetaminophen   650 mg Rectal Q8H   amLODipine   5 mg Oral QHS   carbidopa -levodopa   1 tablet Oral QHS   Chlorhexidine  Gluconate Cloth  6 each Topical Daily   cholecalciferol   3,000 Units Oral Daily   vitamin B-12  2,000 mcg Oral Daily   finasteride   5 mg Oral Daily   gabapentin   300 mg Oral TID   multivitamin  1 tablet Oral BID   oxyCODONE   10 mg Oral 5 X Daily   pravastatin   20 mg Oral Daily   rOPINIRole   1 mg Oral QID   sodium chloride  flush  10-40 mL Intracatheter Q12H   tamsulosin   0.4 mg Oral q AM   terazosin   2 mg Oral QHS    Objective: Vital signs in last 24 hours: Temp:  [97.5 F (36.4 C)-97.8 F (36.6 C)] 97.7 F (36.5 C) (04/24 0818) Pulse Rate:  [49-67] 60 (04/24 0818) Resp:  [16-18] 18 (04/24 0818) BP: (108-155)/(46-61) 155/59 (04/24 0818) SpO2:  [95 %-98 %] 98 % (04/24 0818) Physical Exam  Constitutional: He is oriented to person,. He appears well-developed and well-nourished. No distress.  HENT:  Mouth/Throat: Oropharynx is clear and moist. No oropharyngeal exudate.  Cardiovascular: Normal rate, regular rhythm and normal heart sounds. Exam reveals no gallop and no friction rub.  No murmur heard.  Pulmonary/Chest: Effort normal and breath  sounds normal. No respiratory distress. He has no wheezes.  ZOX:WRUE elbow wrapped Skin: Skin is warm and dry. No rash noted. No erythema.  Psychiatric: He has a normal mood and affect. His behavior is normal.    Lab Results Recent Labs    10/23/23 0452 10/24/23 0652 10/25/23 0656  WBC 15.3* 9.5 11.7*  HGB 8.7* 7.8* 7.1*  HCT 28.4* 25.2* 23.6*  NA 132* 138  --   K 3.6 4.1  --   CL 101 106  --   CO2 21* 22  --   BUN 26* 37*  --   CREATININE 1.30* 1.21  --      Microbiology: 4/20 blood cx MSSA 4/22 synovial cx MSSA 4/22 or cx MSSA nad staph epi 4/22 blood cx NGTD Studies/Results: MR Lumbar Spine W Wo Contrast Result Date: 10/24/2023 CLINICAL DATA:  Back pain. EXAM: MRI LUMBAR SPINE WITHOUT AND WITH CONTRAST TECHNIQUE: Multiplanar and multiecho pulse sequences of the lumbar spine were obtained without and with intravenous contrast. CONTRAST:  8mL GADAVIST  GADOBUTROL  1 MMOL/ML IV SOLN COMPARISON:  None Available. FINDINGS: Segmentation:  Standard. Alignment: Dextroscoliosis with apex at L4. Grade 1 retrolisthesis at L3-4 and L4-5 and grade 1 anterolisthesis at L5-S1. Vertebrae: There is opposing endplate edema at A5-4 with a small amount of  fluid within the posterior disc space. There is also endplate edema at Z61-09, L4-5 and L5-S1. There is L5-S1 facet edema. No acute fracture. Conus medullaris and cauda equina: Conus extends to the T12 level. Conus and cauda equina appear normal. Paraspinal and other soft tissues: There is a left eccentric and dorsal heterogeneous and peripherally contrast-enhancing collection that extends from L4-S1. Small fluid collections within the left dorsal paraspinous muscles. There is edema within the left psoas muscles with an area of probable phlegmonous change. Disc levels: T12-L1: Unremarkable. L1-L2: Small disc bulge with endplate spurring. No spinal canal stenosis. Severe right neural foraminal stenosis. L2-L3: Small disc bulge with bilateral endplate  spurring. No spinal canal stenosis. Mild right neural foraminal stenosis. L3-L4: Left asymmetric disc bulge with endplate spurring. Left lateral recess narrowing without central spinal canal stenosis. Mild right and moderate left neural foraminal stenosis. L4-L5: Small ventral component of the above described fluid collection. Left lateral recess narrowing without central spinal canal stenosis. Mass effect on the left L5 nerve root in the lateral recess. Severe left neural foraminal stenosis. L5-S1: Moderate facet arthrosis with left facet edema. As above, heterogeneous collection along the dorsal and left side of the epidural space. There is narrowing of the left lateral recess with mass effect on the left S1 nerve root. No central spinal canal stenosis. Moderate right and severe left neural foraminal stenosis. IMPRESSION: 1. Findings concerning for septic arthritis at the left L5-S1 facet. 2. 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 3. Edema within the left psoas muscles with an area of probable phlegmonous change. Small fluid collections in the left dorsal paraspinous muscles, consistent with myositis. 4. Severe right L1-2, left L4-5 and left L5-S1 neural foraminal stenosis. 5. Left lateral recess narrowing at L4-5 and L5-S1 with mass effect on the left L5 and S1 nerve roots. Electronically Signed   By: Juanetta Nordmann M.D.   On: 10/24/2023 22:06   MR HIP LEFT W WO CONTRAST Result Date: 10/24/2023 CLINICAL DATA:  Concern for septic arthritis. Known MSSA bacteremia. EXAM: MRI OF THE LEFT HIP WITHOUT AND WITH CONTRAST TECHNIQUE: Multiplanar, multisequence MR imaging was performed both before and after administration of intravenous contrast. CONTRAST:  8mL GADAVIST  GADOBUTROL  1 MMOL/ML IV SOLN COMPARISON:  None Available. FINDINGS: Soft tissue and Muscle: There is diffuse subcutaneous edema extending along the visualized lower abdominal wall and into the bilateral proximal hips. There is relatively  symmetric bilateral diffuse intramuscular and interstitial edema of the visualized proximal thighs, most notably involving the bilateral gluteus medius, adductor and anterior compartment musculature. No significant associated enhancement. No discrete loculated intramuscular collection identified.Hamstring tendon origins are intact.Gluteal cuff tendon insertions are intact.Trace pelvic free fluid. Bones/Hip: No acute osseous abnormality. Femoral heads are seated within the acetabula. No hip joint effusion. No periarticular edema or enhancement. Diffuse red marrow reconversion. IMPRESSION: 1. No evidence of septic arthritis of the left hip. No acute osseous abnormality. 2. Diffuse subcutaneous edema of the visualized lower abdominal wall extending through the bilateral proximal hips with symmetric bilateral relatively diffuse intramuscular and interstitial edema of the proximal thighs. No evidence of enhancement. No fluid collection. These findings are nonspecific and could reflect systemic inflammatory response in the setting of known MSSA bacteremia, non-suppurative myositis, or volume overload. 3. Trace nonspecific pelvic free fluid. Electronically Signed   By: Mannie Seek M.D.   On: 10/24/2023 20:10   ECHO TEE Result Date: 10/24/2023    TRANSESOPHOGEAL ECHO REPORT   Patient Name:   Justin Humphrey  Ehinger Jr. Date of Exam: 10/24/2023 Medical Rec #:  [id number]              Height:       69.0 in Accession #:    [id number]             Weight:       168.2 lb Date of Birth:  1941/06/24              BSA:          1.919 m Patient Age:    82 years               BP:           152/68 mmHg Patient Gender: M                      HR:           63 bpm. Exam Location:  Inpatient Procedure: Transesophageal Echo, 3D Echo, Color Doppler and Cardiac Doppler            (Both Spectral and Color Flow Doppler were utilized during            procedure). Indications:     Fever  History:         Patient has prior history of  Echocardiogram examinations, most                  recent 10/16/2023. CAD; Risk Factors:Hypertension.  Sonographer:     Sherline Distel Senior RDCS Referring Phys:  [id number] Hugh Madura Diagnosing Phys: Dorothye Gathers MD PROCEDURE: After discussion of the risks and benefits of a TEE, an informed consent was obtained from the patient. The transesophogeal probe was passed without difficulty through the esophogus of the patient. Sedation performed by different physician. The patient was monitored while under deep sedation. Anesthestetic sedation was provided intravenously by Anesthesiology: 193mg  of Propofol , 80mg  of Lidocaine . The patient's vital signs; including heart rate, blood pressure, and oxygen saturation; remained stable throughout the procedure. The patient developed no complications during the procedure.  IMPRESSIONS  1. Left ventricular ejection fraction, by estimation, is 55 to 60%. The left ventricle has normal function. The left ventricle has no regional wall motion abnormalities.  2. Right ventricular systolic function is normal. The right ventricular size is normal.  3. No left atrial/left atrial appendage thrombus was detected.  4. The mitral valve is normal in structure. Mild mitral valve regurgitation. No evidence of mitral stenosis.  5. The aortic valve is tricuspid. Aortic valve regurgitation is not visualized. No aortic stenosis is present.  6. There is Moderate (Grade III) plaque.  7. The inferior vena cava is normal in size with greater than 50% respiratory variability, suggesting right atrial pressure of 3 mmHg.  8. 3D performed of the LAA and demonstrates No thrombus. Conclusion(s)/Recommendation(s): No evidence of vegetation/infective endocarditis on this transesophageael echocardiogram. FINDINGS  Left Ventricle: Left ventricular ejection fraction, by estimation, is 55 to 60%. The left ventricle has normal function. The left ventricle has no regional wall motion abnormalities. The left ventricular internal  cavity size was normal in size. There is  no left ventricular hypertrophy. Right Ventricle: The right ventricular size is normal. No increase in right ventricular wall thickness. Right ventricular systolic function is normal. Left Atrium: Left atrial size was normal in size. No left atrial/left atrial appendage thrombus was detected. Right Atrium: Right atrial size was normal in size. Pericardium: There is no evidence of pericardial  effusion. Mitral Valve: The mitral valve is normal in structure. Mild mitral valve regurgitation. No evidence of mitral valve stenosis. Tricuspid Valve: The tricuspid valve is normal in structure. Tricuspid valve regurgitation is trivial. No evidence of tricuspid stenosis. Aortic Valve: The aortic valve is tricuspid. Aortic valve regurgitation is not visualized. No aortic stenosis is present. Pulmonic Valve: The pulmonic valve was normal in structure. Pulmonic valve regurgitation is not visualized. No evidence of pulmonic stenosis. Aorta: The aortic root is normal in size and structure. There is moderate (Grade III) plaque. Venous: The inferior vena cava is normal in size with greater than 50% respiratory variability, suggesting right atrial pressure of 3 mmHg. IAS/Shunts: No atrial level shunt detected by color flow Doppler. Additional Comments: Spectral Doppler performed. Dorothye Gathers MD Electronically signed by Dorothye Gathers MD Signature Date/Time: 10/24/2023/12:13:13 PM    Final    US  EKG SITE RITE Result Date: 10/24/2023 If Site Rite image not attached, placement could not be confirmed due to current cardiac rhythm.  EP STUDY Result Date: 10/24/2023 See surgical note for result.  DG Elbow 2 Views Left Result Date: 10/23/2023 CLINICAL DATA:  Septic joint EXAM: LEFT ELBOW - 2 VIEW COMPARISON:  Left elbow x-ray 10/21/2023. FINDINGS: There is diffuse soft tissue swelling surrounding the elbow. Orthopedic hardware has been removed within the proximal ulna and distal humerus. A  single horizontal screw persists in the distal humerus. The bones are diffusely osteopenic. No definite acute fracture identified. There are degenerative changes between the distal humerus and ulna. The radial head appears subluxed/dislocated anteriorly. There is some periosteal reaction along the posterior aspect of the distal humerus. IMPRESSION: 1. Diffuse soft tissue swelling surrounding the elbow. 2. The radial head appears subluxed/dislocated anteriorly. 3. No definite acute fracture identified. 4. Orthopedic hardware has been removed within the proximal ulna and distal humerus. A single horizontal screw persists in the distal humerus. Electronically Signed   By: Tyron Gallon M.D.   On: 10/23/2023 20:08   DG ELBOW COMPLETE LEFT (3+VIEW) Result Date: 10/23/2023 CLINICAL DATA:  Elective surgery.  Removal of hardware. EXAM: LEFT ELBOW - COMPLETE 3+ VIEW COMPARISON:  Radiograph 10/21/2023 FINDINGS: Four fluoroscopic spot views of the left elbow submitted from the operating room. The previous hardware has been removed except for a single screw in the distal humerus. Fluoroscopic time 20.7 seconds. Dose 1.02 mGy. IMPRESSION: Intraoperative fluoroscopy during hardware removal. Electronically Signed   By: Chadwick Colonel M.D.   On: 10/23/2023 16:18   DG C-Arm 1-60 Min-No Report Result Date: 10/23/2023 Fluoroscopy was utilized by the requesting physician.  No radiographic interpretation.   DG C-Arm 1-60 Min-No Report Result Date: 10/23/2023 Fluoroscopy was utilized by the requesting physician.  No radiographic interpretation.     Assessment/Plan: MSSA bacteremia likely secondary to infected left elbow olecranon bursitis/HW involvement and now found to have lumbro-sacral epidural abscess - recommend t have NSGY evaluate, unlikely to be candidate for surgical decompression since has multiple co-morbidities but at this time no signs of cord compression.   Plan to do 8 wks since 4/22 of cefazolin  2gm IV Q  8hr.   We will see back in the ID clinic in 6- 8wks  [facility] Pager: [phone]  10/25/2023, 1:35 PM

## 2023-10-25 NOTE — Care Management Important Message (Signed)
 Important Message  Patient Details  Name: Justin [name] [facility]. MRN: [id number] Date of Birth: 1940-09-14   Important Message Given:  Yes - Medicare IM     Wynonia Hedges 10/25/2023, 11:27 AM

## 2023-10-25 NOTE — Progress Notes (Signed)
 Orthopaedic Trauma [facility] Progress Note  Patient ID: Justin [name]. MRN: [id number] DOB/AGE: 85-Nov-1942 85 y.o.  Subjective:  Left elbow feels much better C/o increasing Lumbar pain and L hip pain yesterday  Has chronic lumbar DDD, sees dr Rexanne Catalina  MRI of L hip does not show any septic process  MRI L spine shows epidural abscess, septic arthritis of Left L5-s1 facet along with chronic degenerative changes of his lumbar spine  Pain currently stable  Will be getting PICC    ROS As above  Objective:   VITALS:   Vitals:   10/24/23 2100 10/24/23 2110 10/25/23 0414 10/25/23 0818  BP: 119/60 119/60 120/61 (!) 155/59  Pulse:   67 60  Resp:   18 18  Temp:   97.6 F (36.4 C) 97.7 F (36.5 C)  TempSrc:   Oral   SpO2:   96% 98%  Weight:      Height:        Estimated body mass index is 24.84 kg/m as calculated from the following:   Height as of this encounter: 5\' 9"  (1.753 m).   Weight as of this encounter: 76.3 kg.   Intake/Output      04/23 0701 04/24 0700 04/24 0701 04/25 0700   P.O.  400   I.V. (mL/kg) 100 (1.3)    IV Piggyback 636.9    Total Intake(mL/kg) 736.9 (9.7) 400 (5.2)   Urine (mL/kg/hr) 250 (0.1)    Blood     Total Output 250    Net +486.9 +400        Urine Occurrence 1 x      LABS  Results for orders placed or performed during the hospital encounter of 10/21/23 (from the past 24 hours)  CBC     Status: Abnormal   Collection Time: 10/25/23  6:56 AM  Result Value Ref Range   WBC 11.7 (H) 4.0 - 10.5 K/uL   RBC 3.11 (L) 4.22 - 5.81 MIL/uL   Hemoglobin 7.1 (L) 13.0 - 17.0 g/dL   HCT 40.9 (L) 81.1 - 91.4 %   MCV 75.9 (L) 80.0 - 100.0 fL   MCH 22.8 (L) 26.0 - 34.0 pg   MCHC 30.1 30.0 - 36.0 g/dL   RDW 78.2 (H) 95.6 - 21.3 %   Platelets 106 (L) 150 - 400 K/uL   nRBC 0.0 0.0 - 0.2 %     PHYSICAL EXAM:   Gen: NAD, sitting up in bed, A&O x 3, very pleasant  Ext:        Left upper extremity              Dressing c/d/I             Ext warm              Baseline clawhand deformity          Left Lower Extremity   No increased pain with flexion and rotation of hip   No acute changes in motor or sensory functions   Ext warm     Assessment/Plan: 1 Day Post-Op   Principal Problem:   Acute on chronic pain of left elbow concerning for septic arthritis Active Problems:   RLS (restless legs syndrome)   Low back pain   BPH (benign prostatic hyperplasia)   Essential hypertension  Coronary artery disease   GERD (gastroesophageal reflux disease)   MDS (myelodysplastic syndrome) (HCC)   Hypokalemia   MSSA bacteremia   Anti-infectives (From admission, onward)    Start     Dose/Rate Route Frequency Ordered Stop   10/22/23 1700  vancomycin  (VANCOREADY) IVPB 1250 mg/250 mL  Status:  Discontinued        1,250 mg 166.7 mL/hr over 90 Minutes Intravenous Every 24 hours 10/21/23 1914 10/22/23 0619   10/22/23 0715  ceFAZolin  (ANCEF ) IVPB 2g/100 mL premix        2 g 200 mL/hr over 30 Minutes Intravenous Every 8 hours 10/22/23 0619     10/21/23 2000  cefTRIAXone  (ROCEPHIN ) 2 g in sodium chloride  0.9 % 100 mL IVPB  Status:  Discontinued        2 g 200 mL/hr over 30 Minutes Intravenous Every 24 hours 10/21/23 1907 10/22/23 0619   10/21/23 1530  vancomycin  (VANCOREADY) IVPB 1500 mg/300 mL        1,500 mg 150 mL/hr over 120 Minutes Intravenous  Once 10/21/23 1519 10/21/23 1818   10/21/23 1530  ceFEPIme  (MAXIPIME ) 2 g in sodium chloride  0.9 % 100 mL IVPB        2 g 200 mL/hr over 30 Minutes Intravenous  Once 10/21/23 1519 10/21/23 1649     .  POD/HD#: 2  83 y/o male with L elbow septic arthritis, septic olecranon busitis, retained hardware L distal humerus and olecranon    -L elbow septic arthritis, septic olecranon busitis, retained hardware L distal humerus and olecranon s/p ROH L distal humerus and ulna, I&D L elbow, capsulectomy, bursectomy  No  formal restrictions L elbow Dressing change tomorrow  Baseline elbow contracture and clawhand deformity Ice PRN              Elevate extremity to assist with swelling control              Sling for comfort only   - Lumbar epidural abscess, septic facet joint, chronic degenerative changes lumbar spine, L psoas myositis   Recommend NS eval   Have relayed to ID and medicine    - Pain management:             Multimodal    - ABL anemia/Hemodynamics             Monitor              Cbc in am              May need transfusion    - ID:              Abx per ID   - Dispo:             Ortho issues addressed  NS consult              Abx per ID   Geroldine Kotyk, PA-C [phone] (C) 10/25/2023, 10:37 AM  [facility] [address] [place] [address] [phone] Deanna Expose) [phone] (F)    After 5pm and on the weekends please log on to Amion, go to orthopaedics and the look under the [facility] Call for the provider(s) on call. You can also call our office at [phone] and then follow the prompts to be connected to the call team.  Patient ID: Justin Kingdom [name]., male   DOB: Nov 23, 1940, 83 y.o.   MRN: [id number]

## 2023-10-25 NOTE — Progress Notes (Signed)
 Peripherally Inserted Central Catheter Placement  The IV Nurse has discussed with the patient and/or persons authorized to consent for the patient, the purpose of this procedure and the potential benefits and risks involved with this procedure.  The benefits include less needle sticks, lab draws from the catheter, and the patient may be discharged home with the catheter. Risks include, but not limited to, infection, bleeding, blood clot (thrombus formation), and puncture of an artery; nerve damage and irregular heartbeat and possibility to perform a PICC exchange if needed/ordered by physician.  Alternatives to this procedure were also discussed.  Bard Power PICC patient education guide, fact sheet on infection prevention and patient information card has been provided to patient /or left at bedside.  PICC placed by Burnard Carrow, RN   PICC Placement Documentation  PICC Single Lumen 10/25/23 Right Brachial 36 cm 0 cm (Active)  Indication for Insertion or Continuance of Line Prolonged intravenous therapies;Home intravenous therapies (PICC only) 10/25/23 1302  Exposed Catheter (cm) 0 cm 10/25/23 1302  Site Assessment Clean, Dry, Intact 10/25/23 1302  Line Status Flushed;Saline locked;Blood return noted 10/25/23 1302  Dressing Type Transparent;Securing device 10/25/23 1302  Dressing Status Antimicrobial disc/dressing in place;Clean, Dry, Intact 10/25/23 1302  Line Care Connections checked and tightened 10/25/23 1302  Line Adjustment (NICU/IV Team Only) No 10/25/23 1302  Dressing Intervention New dressing 10/25/23 1302  Dressing Change Due 11/01/23 10/25/23 1302       Fanny Agan, Miles Allan 10/25/2023, 1:02 PM

## 2023-10-25 NOTE — Consult Note (Signed)
 Reason for Consult: Lumbosacral epidural abscess, chronic lumbago Referring Physician: Dr. Gaylyn [name]. is an 83 y.o. male.  HPI: The patient is an 83 year old white male with a history of chronic back pain which is managed by [place] [name] at [facility].  The patient has had chronic problems with his left shoulder and arm and had surgeries.  He was admitted with bacteremia and infected left elbow.  He underwent an incision and drainage of his wound by Dr. Guyann [name] on 10/23/2023.  The patient complained of back pain and was worked up with a lumbar MRI which demonstrated chronic degenerative changes and an L5-S1 epidural abscess.  A neurosurgical consultation was requested.  Presently the patient is alert and pleasant.  His daughter, I believe, is at the bedside.  He complains of chronic left-sided low back pain with some pain into his left leg.  Past Medical History:  Diagnosis Date   Alcoholism (HCC)    Dry 13 years   Basal cell carcinoma    Right Chest   BPH (benign prostatic hyperplasia)    Diverticulosis of colon    Dizzy spells    none recent   DJD (degenerative joint disease)    lower back   Elbow fracture, left 2009   Dr [place] [name]   FRACTURE, [place], LEFT 12/23/2007   Qualifier: Diagnosis of   By: [place] [name]        GERD (gastroesophageal reflux disease)    Glaucoma    History of TIAs 1 yrs ago   Hyperkeratosis    LBP (low back pain)    Macular degeneration    left wet right dry sees dr [place] [name]   Melanoma [facility]) 2009   x3 Dr [place] [name]   Osteoarthritis    Restless leg syndrome    Thrombocytopenia (HCC) 11/18/2021   Tinnitus    Tobacco abuse    Uses walker    Wears glasses    reading   Wears partial dentures    upper    Past Surgical History:  Procedure Laterality Date   CATARACT EXTRACTION Bilateral 2018   HARDWARE REMOVAL Left 07/09/2020   Procedure: Left elbow hardware removal;  Surgeon: [place] Medicus, MD;  Location: [facility];  Service: Orthopedics;  Laterality: Left;  60 mins   HARDWARE REMOVAL Left 10/23/2023   Procedure: REMOVAL, HARDWARE;  Surgeon: [place] Lia, MD;  Location: [facility] OR;  Service: Orthopedics;  Laterality: Left;   IRRIGATION AND DEBRIDEMENT ELBOW Left 10/23/2023   Procedure: IRRIGATION AND DEBRIDEMENT ELBOW;  Surgeon: [place] Lia, MD;  Location[facility];  Service: Orthopedics;  Laterality: Left;   mda     Dr. Seward [name] wet injection   MELANOMA EXCISION  2009   x3   RECONSTRUCTION MEDIAL COLLATERAL LIGAMENT ELBOW W/ TENDON GRAFT  2009   Left, Dr [place] [name]   TONSILLECTOMY  as child   TOTAL KNEE ARTHROPLASTY Right 11/09/2020   Procedure: TOTAL KNEE ARTHROPLASTY;  Surgeon: [place] Crew, MD;  Location: [facility] ORS;  Service: Orthopedics;  Laterality: Right;   TRANSESOPHAGEAL ECHOCARDIOGRAM (CATH LAB) N/A 10/24/2023   Procedure: TRANSESOPHAGEAL ECHOCARDIOGRAM;  Surgeon: [place] Madura, MD;  Location: [facility] INVASIVE CV LAB;  Service: Cardiovascular;  Laterality: N/A;    Family History  Problem Relation Age of Onset   Cancer Mother        ?breast   Cancer Father        Lung    Social History:  reports that he quit smoking  about 39 years ago. His smoking use included cigarettes. He started smoking about 77 years ago. He has a 114 pack-year smoking history. He has never used smokeless tobacco. He reports that he does not currently use drugs. He reports that he does not drink alcohol .  Allergies:  Allergies  Allergen Reactions   Levofloxacin Other (See Comments)    REACTION: hands pealed    Medications: I have reviewed the patient's current medications. Prior to Admission:  Medications Prior to Admission  Medication Sig Dispense Refill Last Dose/Taking   acetaminophen  (TYLENOL ) 500 MG tablet Take 1,000 mg by mouth every 6 (six) hours as needed for mild pain (pain score 1-3) or moderate pain (pain score 4-6).   10/20/2023   amLODipine  (NORVASC ) 5 MG tablet TAKE 1 TABLET BY MOUTH DAILY (Patient taking  differently: Take 5 mg by mouth at bedtime.) 90 tablet 3 10/20/2023   calcium  carbonate (TUMS EX) 750 MG chewable tablet Chew 1,500 mg by mouth daily as needed for heartburn.   10/20/2023   carbidopa -levodopa  (SINEMET  IR) 25-100 MG tablet For breakthrough restless leg, you can take 1 tablet at bedtime.  OK to take extra tab as needed during the day. (Patient taking differently: Take 1 tablet by mouth at bedtime.) 100 tablet 3 10/20/2023   Cholecalciferol  1000 UNITS tablet Take 3,000 Units by mouth daily.   10/20/2023   Cyanocobalamin  (VITAMIN B-12) 1000 MCG SUBL DISSOLVE 2 TABLETS IN MOUTH EVERY DAY 180 tablet 1 10/20/2023   finasteride  (PROSCAR ) 5 MG tablet TAKE 1 TABLET (5 MG TOTAL) BY MOUTH DAILY. 90 tablet 1 10/20/2023   furosemide  (LASIX ) 20 MG tablet LASIX  20 MG AS NEEDED FOR 3 POUND WEIGHT GAIN OVER NIGHT AND 5 POUND WEIGHT GAIN IN A WEEK. 30 tablet 6 10/20/2023   gabapentin  (NEURONTIN ) 300 MG capsule Take 1 capsule (300 mg total) by mouth 4 (four) times daily. Follow-up appt due in Sept must see provider for future refills (Patient taking differently: Take 300 mg by mouth 3 (three) times daily.) 360 capsule 1 10/20/2023   ibuprofen (ADVIL) 200 MG tablet Take 400 mg by mouth every 6 (six) hours as needed for mild pain (pain score 1-3) or moderate pain (pain score 4-6).   10/21/2023   magnesium  hydroxide (MILK OF MAGNESIA) 400 MG/5ML suspension Take 15 mLs by mouth daily as needed for mild constipation.   Past Week   meclizine  (ANTIVERT ) 12.5 MG tablet TAKE 1 TABLET BY MOUTH 3 TIMES DAILY AS NEEDED FOR DIZZINESS. 90 tablet 2 Unknown   Multiple Vitamins-Minerals (PRESERVISION AREDS PO) Take 2 tablets by mouth 2 (two) times daily.   10/20/2023   Oxycodone  HCl 10 MG TABS Take 10 mg by mouth 5 (five) times daily.   10/21/2023   Polyethyl Glycol-Propyl Glycol (SYSTANE OP) Place 1 drop into both eyes 2 (two) times daily as needed (dry eyes). [facility]   10/20/2023   pravastatin  (PRAVACHOL ) 20 MG tablet TAKE 1 TABLET  (20 MG TOTAL) BY MOUTH DAILY. 90 tablet 1 10/20/2023   predniSONE  (STERAPRED UNI-PAK 21 TAB) 10 MG (21) TBPK tablet Take per the package instructions.   10/20/2023   rOPINIRole  (REQUIP ) 2 MG tablet TAKE 1 TABLET BY MOUTH AT AT NOON AND 1 TAB AT 4PM AND 1 TAB AT BEDTIME (Patient taking differently: Take 1 mg by mouth in the morning, at noon, in the evening, and at bedtime.) 270 tablet 1 10/20/2023   tamsulosin  (FLOMAX ) 0.4 MG CAPS capsule Take 1 capsule (0.4 mg total) by mouth  in the morning. 90 capsule 1 10/20/2023   terazosin  (HYTRIN ) 2 MG capsule TAKE 1 CAPSULE BY MOUTH EVERY NIGHT AT BEDTIME 90 capsule 1 10/20/2023   Scheduled:  acetaminophen   650 mg Oral Q8H   Or   acetaminophen   650 mg Rectal Q8H   amLODipine   5 mg Oral QHS   carbidopa -levodopa   1 tablet Oral QHS   cholecalciferol   3,000 Units Oral Daily   vitamin B-12  2,000 mcg Oral Daily   finasteride   5 mg Oral Daily   gabapentin   300 mg Oral TID   multivitamin  1 tablet Oral BID   oxyCODONE   10 mg Oral 5 X Daily   pravastatin   20 mg Oral Daily   rOPINIRole   1 mg Oral QID   tamsulosin   0.4 mg Oral q AM   terazosin   2 mg Oral QHS   Continuous:   ceFAZolin  (ANCEF ) IV 2 g (10/25/23 0981)   sodium chloride      sodium PHOSPHATE  IVPB (in mmol)     PRN:calcium  carbonate, carbidopa -levodopa  **AND** carbidopa -levodopa , HYDROmorphone  (DILAUDID ) injection, magnesium  hydroxide, ondansetron  **OR** ondansetron  (ZOFRAN ) IV, polyvinyl alcohol  Anti-infectives (From admission, onward)    Start     Dose/Rate Route Frequency Ordered Stop   10/22/23 1700  vancomycin  (VANCOREADY) IVPB 1250 mg/250 mL  Status:  Discontinued        1,250 mg 166.7 mL/hr over 90 Minutes Intravenous Every 24 hours 10/21/23 1914 10/22/23 0619   10/22/23 0715  ceFAZolin  (ANCEF ) IVPB 2g/100 mL premix        2 g 200 mL/hr over 30 Minutes Intravenous Every 8 hours 10/22/23 0619     10/21/23 2000  cefTRIAXone  (ROCEPHIN ) 2 g in sodium chloride  0.9 % 100 mL IVPB  Status:   Discontinued        2 g 200 mL/hr over 30 Minutes Intravenous Every 24 hours 10/21/23 1907 10/22/23 0619   10/21/23 1530  vancomycin  (VANCOREADY) IVPB 1500 mg/300 mL        1,500 mg 150 mL/hr over 120 Minutes Intravenous  Once 10/21/23 1519 10/21/23 1818   10/21/23 1530  ceFEPIme  (MAXIPIME ) 2 g in sodium chloride  0.9 % 100 mL IVPB        2 g 200 mL/hr over 30 Minutes Intravenous  Once 10/21/23 1519 10/21/23 1649        Results for orders placed or performed during the hospital encounter of 10/21/23 (from the past 48 hours)  Type and screen [facility]     Status: None   Collection Time: 10/23/23  2:00 PM  Result Value Ref Range   ABO/RH(D) O NEG    Antibody Screen NEG    Sample Expiration      10/26/2023,2359 Performed at [facility], [address] [place], [place] [address]   Aerobic/Anaerobic Culture w Gram Stain (surgical/deep wound)     Status: None (Preliminary result)   Collection Time: 10/23/23  2:37 PM   Specimen: Synovial, Left Elbow; Body Fluid  Result Value Ref Range   Specimen Description SYNOVIAL    Special Requests SWAB OF LEFT ELBOW SYNOVIAL FLUID    Gram Stain      ABUNDANT WBC PRESENT, PREDOMINANTLY PMN RARE GRAM POSITIVE COCCI IN PAIRS Gram Stain Report Called to,Read Back By and Verified With: RN B. HICKLIN  [id number]@ 2036 FH    Culture      MODERATE STAPHYLOCOCCUS AUREUS SUSCEPTIBILITIES TO FOLLOW Performed at [facility], [address] [place], [place]  [address]    Report Status PENDING   Aerobic/Anaerobic Culture w Gram Stain (surgical/deep wound)     Status: None (Preliminary result)   Collection Time: 10/23/23  3:20 PM   Specimen: Soft Tissue, Other  Result Value Ref Range   Specimen Description TISSUE    Special Requests OLECRANON BURSA    Gram Stain      NO WBC SEEN RARE GRAM POSITIVE COCCI IN PAIRS Gram Stain Report Called to,Read Back By and Verified With: RN Janey Meek [id number] @ 1825 FH    Culture       RARE STAPHYLOCOCCUS AUREUS RARE STAPHYLOCOCCUS EPIDERMIDIS SUSCEPTIBILITIES TO FOLLOW Performed at [facility], [address], [place], [place] [address]    Report Status PENDING   Culture, blood (Routine X 2) w Reflex to ID Panel     Status: None (Preliminary result)   Collection Time: 10/23/23 10:13 PM   Specimen: BLOOD  Result Value Ref Range   Specimen Description BLOOD SITE NOT SPECIFIED    Special Requests      BOTTLES DRAWN AEROBIC AND ANAEROBIC Blood Culture adequate volume   Culture      NO GROWTH 2 DAYS Performed at [facility], [address], [place], [place] [address]    Report Status PENDING   Culture, blood (Routine X 2) w Reflex to ID Panel     Status: None (Preliminary result)   Collection Time: 10/23/23 10:15 PM   Specimen: BLOOD  Result Value Ref Range   Specimen Description BLOOD SITE NOT SPECIFIED    Special Requests      BOTTLES DRAWN AEROBIC AND ANAEROBIC Blood Culture adequate volume   Culture      NO GROWTH 2 DAYS Performed at [facility], [address], [place], [place] [address]    Report Status PENDING   CBC     Status: Abnormal   Collection Time: 10/24/23  6:52 AM  Result Value Ref Range   WBC 9.5 4.0 - 10.5 K/uL   RBC 3.29 (L) 4.22 - 5.81 MIL/uL   Hemoglobin 7.8 (L) 13.0 - 17.0 g/dL    Comment: Reticulocyte Hemoglobin testing may be clinically indicated, consider ordering this additional test [id number]    HCT 25.2 (L) 39.0 - 52.0 %   MCV 76.6 (L) 80.0 - 100.0 fL   MCH 23.7 (L) 26.0 - 34.0 pg   MCHC 31.0 30.0 - 36.0 g/dL   RDW 69.6 (H) 29.5 - 28.4 %   Platelets 69 (L) 150 - 400 K/uL    Comment: Immature Platelet Fraction may be clinically indicated, consider ordering this additional test [id number] REPEATED TO VERIFY    nRBC 0.0 0.0 - 0.2 %    Comment: Performed at [facility], [address] [place], [place] [address]  Basic metabolic panel     Status: Abnormal   Collection Time: 10/24/23  6:52 AM   Result Value Ref Range   Sodium 138 135 - 145 mmol/L   Potassium 4.1 3.5 - 5.1 mmol/L   Chloride 106 98 - 111 mmol/L   CO2 22 22 - 32 mmol/L   Glucose, Bld 149 (H) 70 - 99 mg/dL    Comment: Glucose reference range applies only to samples taken after fasting for at least 8 hours.   BUN 37 (H) 8 - 23 mg/dL   Creatinine, Ser 6.64 0.61 - 1.24 mg/dL   Calcium  8.3 (L) 8.9 - 10.3 mg/dL   GFR, Estimated 59 (L) >  60 mL/min    Comment: (NOTE) Calculated using the CKD-EPI Creatinine Equation (2021)    Anion gap 10 5 - 15    Comment: Performed at [facility], [address], [place], [place] [address]  Phosphorus     Status: None   Collection Time: 10/24/23  6:52 AM  Result Value Ref Range   Phosphorus 3.9 2.5 - 4.6 mg/dL    Comment: Performed at [facility], [address] [place], [place] [address]  CBC     Status: Abnormal   Collection Time: 10/25/23  6:56 AM  Result Value Ref Range   WBC 11.7 (H) 4.0 - 10.5 K/uL   RBC 3.11 (L) 4.22 - 5.81 MIL/uL   Hemoglobin 7.1 (L) 13.0 - 17.0 g/dL    Comment: Reticulocyte Hemoglobin testing may be clinically indicated, consider ordering this additional test [id number]    HCT 23.6 (L) 39.0 - 52.0 %   MCV 75.9 (L) 80.0 - 100.0 fL   MCH 22.8 (L) 26.0 - 34.0 pg   MCHC 30.1 30.0 - 36.0 g/dL   RDW 62.9 (H) 52.8 - 41.3 %   Platelets 106 (L) 150 - 400 K/uL    Comment: Immature Platelet Fraction may be clinically indicated, consider ordering this additional test [id number] REPEATED TO VERIFY PLATELET COUNT CONFIRMED BY SMEAR    nRBC 0.0 0.0 - 0.2 %    Comment: Performed at [facility], [address] [place], [place] [address]    MR Lumbar Spine W Wo Contrast Result Date: 10/24/2023 CLINICAL DATA:  Back pain. EXAM: MRI LUMBAR SPINE WITHOUT AND WITH CONTRAST TECHNIQUE: Multiplanar and multiecho pulse sequences of the lumbar spine were obtained without and with intravenous contrast. CONTRAST:  8mL GADAVIST  GADOBUTROL  1 MMOL/ML IV  SOLN COMPARISON:  None Available. FINDINGS: Segmentation:  Standard. Alignment: Dextroscoliosis with apex at L4. Grade 1 retrolisthesis at L3-4 and L4-5 and grade 1 anterolisthesis at L5-S1. Vertebrae: There is opposing endplate edema at Y4-0 with a small amount of fluid within the posterior disc space. There is also endplate edema at H47-42, L4-5 and L5-S1. There is L5-S1 facet edema. No acute fracture. Conus medullaris and cauda equina: Conus extends to the T12 level. Conus and cauda equina appear normal. Paraspinal and other soft tissues: There is a left eccentric and dorsal heterogeneous and peripherally contrast-enhancing collection that extends from L4-S1. Small fluid collections within the left dorsal paraspinous muscles. There is edema within the left psoas muscles with an area of probable phlegmonous change. Disc levels: T12-L1: Unremarkable. L1-L2: Small disc bulge with endplate spurring. No spinal canal stenosis. Severe right neural foraminal stenosis. L2-L3: Small disc bulge with bilateral endplate spurring. No spinal canal stenosis. Mild right neural foraminal stenosis. L3-L4: Left asymmetric disc bulge with endplate spurring. Left lateral recess narrowing without central spinal canal stenosis. Mild right and moderate left neural foraminal stenosis. L4-L5: Small ventral component of the above described fluid collection. Left lateral recess narrowing without central spinal canal stenosis. Mass effect on the left L5 nerve root in the lateral recess. Severe left neural foraminal stenosis. L5-S1: Moderate facet arthrosis with left facet edema. As above, heterogeneous collection along the dorsal and left side of the epidural space. There is narrowing of the left lateral recess with mass effect on the left S1 nerve root. No central spinal canal stenosis. Moderate right and severe left neural foraminal stenosis. IMPRESSION: 1. Findings concerning for septic arthritis at the left L5-S1 facet. 2. Left dorsal  epidural abscess extending from L4-S1. 3. Edema within the left psoas muscles with an area of probable phlegmonous change. Small fluid collections in the left dorsal paraspinous muscles, consistent with myositis. 4. Severe right L1-2, left L4-5 and left L5-S1 neural foraminal stenosis. 5. Left lateral recess narrowing at L4-5 and L5-S1 with mass effect on the left L5 and S1 nerve roots. Electronically Signed   By: Juanetta Nordmann M.D.   On: 10/24/2023 22:06   MR HIP LEFT W WO CONTRAST Result Date: 10/24/2023 CLINICAL DATA:  Concern for septic arthritis. Known MSSA bacteremia. EXAM: MRI OF THE LEFT HIP WITHOUT AND WITH CONTRAST TECHNIQUE: Multiplanar, multisequence MR imaging was performed both before and after administration of intravenous contrast. CONTRAST:  8mL GADAVIST  GADOBUTROL  1 MMOL/ML IV SOLN COMPARISON:  None Available. FINDINGS: Soft tissue and Muscle: There is diffuse subcutaneous edema extending along the visualized lower abdominal wall and into the bilateral proximal hips. There is relatively symmetric bilateral diffuse intramuscular and interstitial edema of the visualized proximal thighs, most notably involving the bilateral gluteus medius, adductor and anterior compartment musculature. No significant associated enhancement. No discrete loculated intramuscular collection identified.Hamstring tendon origins are intact.Gluteal cuff tendon insertions are intact.Trace pelvic free fluid. Bones/Hip: No acute osseous abnormality. Femoral heads are seated within the acetabula. No hip joint effusion. No periarticular edema or enhancement. Diffuse red marrow reconversion. IMPRESSION: 1. No evidence of septic arthritis of the left hip. No acute osseous abnormality. 2. Diffuse subcutaneous edema of the visualized lower abdominal wall extending through the bilateral proximal hips with symmetric bilateral relatively diffuse intramuscular and interstitial edema of the proximal thighs. No evidence of enhancement.  No fluid collection. These findings are nonspecific and could reflect systemic inflammatory response in the setting of known MSSA bacteremia, non-suppurative myositis, or volume overload. 3. Trace nonspecific pelvic free fluid. Electronically Signed   By: Mannie Seek M.D.   On: 10/24/2023 20:10   ECHO TEE Result Date: 10/24/2023    TRANSESOPHOGEAL ECHO REPORT   Patient Name:   [place] [name] [facility]. Date of Exam: 10/24/2023 Medical Rec #:  [id number]              Height:       69.0 in Accession #:    [id number]             Weight:       168.2 lb Date of Birth:  1940/12/29              BSA:          1.919 m Patient Age:    82 years               BP:           152/68 mmHg Patient Gender: M                      HR:           63 bpm. Exam Location:  Inpatient Procedure: Transesophageal Echo, 3D Echo, Color Doppler and Cardiac Doppler            (Both Spectral and Color Flow Doppler were utilized during            procedure). Indications:     Fever  History:         Patient has prior history of Echocardiogram examinations, most                  recent 10/16/2023. CAD; Risk  Factors:Hypertension.  Sonographer:     Sherline Distel Senior RDCS Referring Phys:  [id number] [place] [name] Diagnosing Phys: Dorothye Gathers MD PROCEDURE: After discussion of the risks and benefits of a TEE, an informed consent was obtained from the patient. The transesophogeal probe was passed without difficulty through the esophogus of the patient. Sedation performed by different physician. The patient was monitored while under deep sedation. Anesthestetic sedation was provided intravenously by Anesthesiology: 193mg  of Propofol , 80mg  of Lidocaine . The patient's vital signs; including heart rate, blood pressure, and oxygen saturation; remained stable throughout the procedure. The patient developed no complications during the procedure.  IMPRESSIONS  1. Left ventricular ejection fraction, by estimation, is 55 to 60%. The left ventricle has normal function. The left  ventricle has no regional wall motion abnormalities.  2. Right ventricular systolic function is normal. The right ventricular size is normal.  3. No left atrial/left atrial appendage thrombus was detected.  4. The mitral valve is normal in structure. Mild mitral valve regurgitation. No evidence of mitral stenosis.  5. The aortic valve is tricuspid. Aortic valve regurgitation is not visualized. No aortic stenosis is present.  6. There is Moderate (Grade III) plaque.  7. The inferior vena cava is normal in size with greater than 50% respiratory variability, suggesting right atrial pressure of 3 mmHg.  8. 3D performed of the LAA and demonstrates No thrombus. Conclusion(s)/Recommendation(s): No evidence of vegetation/infective endocarditis on this transesophageael echocardiogram. FINDINGS  Left Ventricle: Left ventricular ejection fraction, by estimation, is 55 to 60%. The left ventricle has normal function. The left ventricle has no regional wall motion abnormalities. The left ventricular internal cavity size was normal in size. There is  no left ventricular hypertrophy. Right Ventricle: The right ventricular size is normal. No increase in right ventricular wall thickness. Right ventricular systolic function is normal. Left Atrium: Left atrial size was normal in size. No left atrial/left atrial appendage thrombus was detected. Right Atrium: Right atrial size was normal in size. Pericardium: There is no evidence of pericardial effusion. Mitral Valve: The mitral valve is normal in structure. Mild mitral valve regurgitation. No evidence of mitral valve stenosis. Tricuspid Valve: The tricuspid valve is normal in structure. Tricuspid valve regurgitation is trivial. No evidence of tricuspid stenosis. Aortic Valve: The aortic valve is tricuspid. Aortic valve regurgitation is not visualized. No aortic stenosis is present. Pulmonic Valve: The pulmonic valve was normal in structure. Pulmonic valve regurgitation is not  visualized. No evidence of pulmonic stenosis. Aorta: The aortic root is normal in size and structure. There is moderate (Grade III) plaque. Venous: The inferior vena cava is normal in size with greater than 50% respiratory variability, suggesting right atrial pressure of 3 mmHg. IAS/Shunts: No atrial level shunt detected by color flow Doppler. Additional Comments: Spectral Doppler performed. Dorothye Gathers MD Electronically signed by Dorothye Gathers MD Signature Date/Time: 10/24/2023/12:13:13 PM    Final    US  EKG SITE RITE Result Date: 10/24/2023 If Site Rite image not attached, placement could not be confirmed due to current cardiac rhythm.  EP STUDY Result Date: 10/24/2023 See surgical note for result.  DG Elbow 2 Views Left Result Date: 10/23/2023 CLINICAL DATA:  Septic joint EXAM: LEFT ELBOW - 2 VIEW COMPARISON:  Left elbow x-ray 10/21/2023. FINDINGS: There is diffuse soft tissue swelling surrounding the elbow. Orthopedic hardware has been removed within the proximal ulna and distal humerus. A single horizontal screw persists in the distal humerus. The bones are diffusely osteopenic. No definite acute fracture identified. There  are degenerative changes between the distal humerus and ulna. The radial head appears subluxed/dislocated anteriorly. There is some periosteal reaction along the posterior aspect of the distal humerus. IMPRESSION: 1. Diffuse soft tissue swelling surrounding the elbow. 2. The radial head appears subluxed/dislocated anteriorly. 3. No definite acute fracture identified. 4. Orthopedic hardware has been removed within the proximal ulna and distal humerus. A single horizontal screw persists in the distal humerus. Electronically Signed   By: Tyron Gallon M.D.   On: 10/23/2023 20:08   DG ELBOW COMPLETE LEFT (3+VIEW) Result Date: 10/23/2023 CLINICAL DATA:  Elective surgery.  Removal of hardware. EXAM: LEFT ELBOW - COMPLETE 3+ VIEW COMPARISON:  Radiograph 10/21/2023 FINDINGS: Four  fluoroscopic spot views of the left elbow submitted from the operating room. The previous hardware has been removed except for a single screw in the distal humerus. Fluoroscopic time 20.7 seconds. Dose 1.02 mGy. IMPRESSION: Intraoperative fluoroscopy during hardware removal. Electronically Signed   By: Chadwick Colonel M.D.   On: 10/23/2023 16:18   DG C-Arm 1-60 Min-No Report Result Date: 10/23/2023 Fluoroscopy was utilized by the requesting physician.  No radiographic interpretation.   DG C-Arm 1-60 Min-No Report Result Date: 10/23/2023 Fluoroscopy was utilized by the requesting physician.  No radiographic interpretation.    ROS: As above Blood pressure (!) 155/59, pulse 60, temperature 97.7 F (36.5 C), resp. rate 18, height 5\' 9"  (1.753 m), weight 76.3 kg, SpO2 98%. Estimated body mass index is 24.84 kg/m as calculated from the following:   Height as of this encounter: 5\' 9"  (1.753 m).   Weight as of this encounter: 76.3 kg.  Physical Exam  General: Somewhat frail appearing 83 year old white male in no apparent distress with a bandage on his left elbow.  HEENT: He has an abrasion, extraocular muscles are intact  Neck: Unremarkable.  Age-appropriate decreased range of motion  Thorax: Symmetric  Abdomen: Soft  Extremities: The patient has a contracted left hand with a tender and bandaged left elbow.  Neurologic exam: The patient is alert and oriented x 3.  Cranial nerves II through XII are examined bilaterally and grossly normal.  The patient's motor strength is normal in his right bicep, handgrip, bilateral gastrocnemius and dorsiflexors.  There is limited exam of his left upper extremity because of his elbow and recent surgery.  Sensory function is grossly intact to light touch sensation all tested dermatomes bilaterally.  Imaging studies: I reviewed the patient's lumbar MRI performed 10/24/2022.  He has diffuse degenerative changes, lumbar facet arthropathy, scoliosis, disc  generation, and L5-S1 spinal thesis, etc.  He has a epidural abscess at L5-S1 left greater than right.  Assessment/Plan: Lumbosacral epidural abscess, chronic back pain, when I discussed the situation with the patient and his daughter.  He does not appear particularly uncomfortable.  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  I would suggest we avoid surgery his age and medical condition and hopefully the antibiotics will work.  I have answered all her questions.  Please have him follow-up with me in the office in a week or 2.  Please call if I can be of further assistance.  Elder Greening 10/25/2023, 11:07 AM

## 2023-10-25 NOTE — Progress Notes (Signed)
 Occupational Therapy Treatment Patient Details Name: Justin [name] [facility]. 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 Pt S/p I&D and removal of hardward L elbow on 10/23/23. PMH of HTN, CAD, BPH, hx of TIA, DDD, low back pain, MDS.   OT comments  Pt noted to have reduced edema than on IE, reinforced education on UE exercises and compensatory strategies for ADLs. Also educated pt on use of sling, trialed sockaid but pt without enough grasp capacity on his LUE to perform with string sock aids. OT to continue following pt acutely to address listed deficits and help transition to next level of care. DC plans remain appropriate for [facility].       If plan is discharge home, recommend the following:  A little help with bathing/dressing/bathroom;Assistance with cooking/housework;Assist for transportation;Other (comment)   Equipment Recommendations  Other (comment) (Rollator and floor standing sock aid)    Recommendations for Other Services      Precautions / Restrictions Precautions Precautions: Fall Recall of Precautions/Restrictions: Intact Precaution/Restrictions Comments: sling for comfort Required Braces or Orthoses: Sling Restrictions Weight Bearing Restrictions Per Provider Order: Yes LUE Weight Bearing Per Provider Order: Weight bearing as tolerated       Mobility Bed Mobility Overal bed mobility: Needs Assistance Bed Mobility: Supine to Sit, Sit to Supine Rolling: Contact guard assist     Sit to supine: Contact guard assist        Transfers                         Balance Overall balance assessment: Needs assistance Sitting-balance support: Feet supported, No upper extremity supported Sitting balance-Leahy Scale: Fair                                     ADL either performed or  assessed with clinical judgement   ADL Overall ADL's : Needs assistance/impaired     Grooming: Wash/dry hands;Sitting;Set up           Upper Body Dressing : Sitting;Set up;Cueing for sequencing Upper Body Dressing Details (indicate cue type and reason): reinforced hemi dressing techniques, min cues to sequence Lower Body Dressing: Sitting/lateral leans;Contact guard assist Lower Body Dressing Details (indicate cue type and reason): doff/don socks using bilat UEs as able, pain in back. Educated pt on the use of socakid, given his grasp limitations he would benefit from floor standing sock aid.               General ADL Comments: Reinforced education on dressing techniques and exercises. Educated pt on donning/doffing of sling, needing min A for thorough tightening of sling.    Extremity/Trunk Assessment Upper Extremity Assessment LUE Deficits / Details: LUE with edema from forearm to hand, fingers 4 & 5 locked into flexion at baseline from past injury. shoulder AROM limited to 40 degrees and elbow ext lag ~50 degrees AROM while flexion AROM  ~ 90 degrees. PROM able to acheive ext lag ~40 deg. Similar presentation as on IE. AAROM of 110 deg shoulder flexion LUE Sensation: decreased light touch (numb fingers)            Vision       Perception     Praxis  Communication Communication Communication: No apparent difficulties   Cognition Arousal: Alert Behavior During Therapy: WFL for tasks assessed/performed Cognition: No apparent impairments                               Following commands: Intact        Cueing   Cueing Techniques: Verbal cues  Exercises General Exercises - Upper Extremity Shoulder Flexion: Left, 10 reps, Seated, Self ROM Shoulder Extension: Self ROM, Left, Seated, 10 reps Elbow Flexion: Self ROM, Left, 10 reps, Seated Elbow Extension: Self ROM, Left, 10 reps, Seated Wrist Flexion: AROM, Left Wrist Extension: AROM, Left     Shoulder Instructions       General Comments      Pertinent Vitals/ Pain       Pain Assessment Faces Pain Scale: Hurts a little bit Pain Location: LUE Pain Descriptors / Indicators: Aching, Sore Pain Intervention(s): Monitored during session  Home Living                                          Prior Functioning/Environment              Frequency  Min 3X/week        Progress Toward Goals  OT Goals(current goals can now be found in the care plan section)  Progress towards OT goals: Progressing toward goals  Acute Rehab OT Goals Patient Stated Goal: To go home OT Goal Formulation: With patient Time For Goal Achievement: 11/07/23 Potential to Achieve Goals: Good  Plan      Co-evaluation                 AM-PAC OT "6 Clicks" Daily Activity     Outcome Measure   Help from another person eating meals?: A Little Help from another person taking care of personal grooming?: A Little Help from another person toileting, which includes using toliet, bedpan, or urinal?: A Little Help from another person bathing (including washing, rinsing, drying)?: A Little Help from another person to put on and taking off regular upper body clothing?: A Little Help from another person to put on and taking off regular lower body clothing?: A Little 6 Click Score: 18    End of Session    OT Visit Diagnosis: Other abnormalities of gait and mobility (R26.89);Pain;Unsteadiness on feet (R26.81) Pain - Right/Left: Left Pain - part of body: Arm   Activity Tolerance Patient tolerated treatment well   Patient Left in bed;with call bell/phone within reach;with bed alarm set   Nurse Communication Mobility status        Time: 1610-9604 OT Time Calculation (min): 47 min  Charges: OT General Charges $OT Visit: 1 Visit OT Treatments $Self Care/Home Management : 8-22 mins $Therapeutic Activity: 8-22 mins $Therapeutic Exercise: 8-22 mins  10/25/2023  AB,  OTR/L  Acute Rehabilitation Services  Office: [phone]   Jorene New 10/25/2023, 3:45 PM

## 2023-10-25 NOTE — Plan of Care (Signed)

## 2023-10-25 NOTE — Assessment & Plan Note (Signed)
 10-25-2023 lumbar epidural abscess L4-S1 seen on MRI lumbar spine. ID requested Neurosurgery consult. Discussed with Dr. Larrie Po with NS. He does not think pt will need operative intervention at this time but will see patient in consultation today. Dtr beth aware of epidural abscess.  10-26-2023 Regimen: Cefazolin  2g IV every 8 hours. End date: 12/18/23 (8 weeks from OR 4/22). F/u with Dr. Larrie Po with neurosurgery.

## 2023-10-26 ENCOUNTER — Telehealth (HOSPITAL_COMMUNITY): Payer: Self-pay | Admitting: Pharmacy Technician

## 2023-10-26 DIAGNOSIS — N401 Enlarged prostate with lower urinary tract symptoms: Secondary | ICD-10-CM | POA: Diagnosis not present

## 2023-10-26 DIAGNOSIS — K219 Gastro-esophageal reflux disease without esophagitis: Secondary | ICD-10-CM

## 2023-10-26 DIAGNOSIS — M00022 Staphylococcal arthritis, left elbow: Secondary | ICD-10-CM | POA: Diagnosis not present

## 2023-10-26 DIAGNOSIS — G061 Intraspinal abscess and granuloma: Secondary | ICD-10-CM | POA: Diagnosis not present

## 2023-10-26 DIAGNOSIS — R7881 Bacteremia: Secondary | ICD-10-CM | POA: Diagnosis not present

## 2023-10-26 LAB — CBC
HCT: 23.1 % — ABNORMAL LOW (ref 39.0–52.0)
Hemoglobin: 6.9 g/dL — CL (ref 13.0–17.0)
MCH: 23.3 pg — ABNORMAL LOW (ref 26.0–34.0)
MCHC: 29.9 g/dL — ABNORMAL LOW (ref 30.0–36.0)
MCV: 78 fL — ABNORMAL LOW (ref 80.0–100.0)
Platelets: 121 10*3/uL — ABNORMAL LOW (ref 150–400)
RBC: 2.96 MIL/uL — ABNORMAL LOW (ref 4.22–5.81)
RDW: 17.9 % — ABNORMAL HIGH (ref 11.5–15.5)
WBC: 9.1 10*3/uL (ref 4.0–10.5)
nRBC: 0 % (ref 0.0–0.2)

## 2023-10-26 LAB — PREPARE RBC (CROSSMATCH)

## 2023-10-26 MED ORDER — SODIUM CHLORIDE 0.9% IV SOLUTION: Freq: Once | INTRAVENOUS | Status: DC

## 2023-10-26 MED ORDER — HEPARIN SOD (PORK) LOCK FLUSH 100 UNIT/ML IV SOLN
250.0000 [IU] | INTRAVENOUS | Status: AC | PRN
  Administered 2023-10-26: 250 [IU]

## 2023-10-26 MED ORDER — SODIUM CHLORIDE 0.9% IV SOLUTION: Freq: Once | INTRAVENOUS | Status: AC

## 2023-10-26 MED ORDER — CEFAZOLIN IV (FOR PTA / DISCHARGE USE ONLY): 2.0000 g | Freq: Three times a day (TID) | INTRAVENOUS | 0 refills | Status: DC

## 2023-10-26 MED ORDER — GABAPENTIN 300 MG PO CAPS: 300.0000 mg | ORAL_CAPSULE | Freq: Three times a day (TID) | ORAL | Status: DC

## 2023-10-26 MED ORDER — OXYCODONE HCL 10 MG PO TABS: 10.0000 mg | ORAL_TABLET | Freq: Every day | ORAL | 0 refills | Status: DC

## 2023-10-26 MED ORDER — FUROSEMIDE 10 MG/ML IJ SOLN
20.0000 mg | Freq: Once | INTRAMUSCULAR | Status: AC
  Administered 2023-10-26: 20 mg via INTRAVENOUS
  Filled 2023-10-26: qty 2

## 2023-10-26 MED ORDER — ONDANSETRON HCL 4 MG PO TABS: 4.0000 mg | ORAL_TABLET | Freq: Four times a day (QID) | ORAL | 0 refills | Status: AC | PRN

## 2023-10-26 NOTE — TOC Progression Note (Addendum)
 Transition of Care (TOC) - Progression Note    Patient Details  Name: Justin [name]. MRN: [id number] Date of Birth: 10/14/1940  Transition of Care [name]) CM/SW Contact  Ronni Colace, RN Phone Number: 10/26/2023, 10:57 AM  Clinical Narrative:    Justin [name] to Justin [name] from  [facility] regarding orders for home IV therapy. Patient had his education and PICC line placed.  1130 messaged with Dr Farrel Hones, patient will likely DC after blood today. Called and left Justin [name] a message 1500 patient receiving his second unit of blood, then he will receive his 1400 ancef  then will be discharged. Justin [name] messaged from [employer] as well as Justin [name] from [employer]. They have a slot set for him for home RN tomorrow   Barriers to Discharge: Continued Medical Work up  Expected Discharge Plan and Services   Discharge Planning Services: CM Consult Post Acute Care Choice: Home Health Living arrangements for the past 2 months: Single Family Home                 DME Arranged:  (rolator) DME Agency: [name] Date DME Agency Contacted: 10/24/23 Time DME Agency Contacted: [phone] Representative spoke with at DME Agency: Justin [name], CM with Justin [name] HH Arranged: RN, PT, OT [name] Agency: [facility] Date HH Agency Contacted: 10/24/23 Time HH Agency Contacted: [phone] Representative spoke with at [facility] Agency: Justin [name], RNCM accepted for RN, PT, OT   Social Determinants of Health (SDOH) Interventions SDOH Screenings   Food Insecurity: No Food Insecurity (10/21/2023)  Housing: Unknown (10/21/2023)  Transportation Needs: No Transportation Needs (10/21/2023)  Utilities: Not At Risk (10/21/2023)  Alcohol  Screen: Low Risk  (07/20/2023)  Depression (PHQ2-9): Low Risk  (07/20/2023)  Financial Resource Strain: Low Risk  (07/20/2023)  Physical Activity: Inactive (07/20/2023)  Social Connections: Moderately Isolated (10/21/2023)  Stress: No Stress Concern Present (07/20/2023)  Tobacco Use: Medium Risk (10/23/2023)   Health Literacy: Adequate Health Literacy (07/20/2023)    Readmission Risk Interventions     No data to display

## 2023-10-26 NOTE — Discharge Summary (Signed)
 [facility] Phys[facility] Discharge Summary   Patient name: Justin [name] [facility].  Admit date:     10/21/2023  Discharge date: 10/26/2023  Attending Physician: Justin [name] [[id number]]  Discharge Physician: Justin [name]   PCP: Justin Kettering, MD  Admitted From: Home  Disposition:  Home  Recommendations for Outpatient Follow-up:  Follow up with PCP in 1-2 weeks Follow up with orthopedics as scheduled Follow up with neurosurgery. Appointment will be called Follow up with Infectious Disease clinic as scheduled. Please follow up on the following pending results: intra-operative cultures  Home Health:Yes. Home PT, OT, RN Equipment/Devices: PICC LINE 10-25-2023 Date Placed  Discharge Condition:Stable CODE STATUS:FULL Diet recommendation: Regular Fluid Restriction: None  Hospital Summary: HPI: Justin [name]. is a 83 y.o. male with medical history significant of HTN, CAD, BPH, hx of TIAs, DDD/chronic low back pain, MDS, thrombocytopenia, depression who presented to ED with complaints of acute on chronic left elbow pain. He started to have pain out of the ordinary last night around 8pm. He denies any trauma. This morning he called his daughter and she went over to his house. He was to weak and in too much pain to even get into the car so she called EMS. He can not move his left elbow at all. Pain 10/10. Pain radiates down his entire hand. He denies any fever, chilled at times. NO N/V. It is warm to touch, not really sure it's changed in color.    He has history of an open fracture in his left elbow in 2007 and had surgery. About 4 years ago one of the pins came lose and this was taken out. At baseline now he can not fully extend or flex to 90 degrees but has some movement in it. He can't used his hands at all. He states his hands are like a claw. He is right handed.      He has been feeling good. Denies any fever/chills, vision changes/headaches, chest pain or palpitations,  shortness of breath or cough, abdominal pain, N/V/D, dysuria or leg swelling.      Denies any fever, vision changes/headaches, chest pain or palpitations, shortness of breath or cough, abdominal pain, N/V/D, dysuria or leg swelling.    He does not smoke or drink alcohol .    ER Course:  vitals: afebrile, bp: 133/69, HR; 78, RR: 16, oxygen: 94% Pertinent labs: WBC: 13, hgb: 9.0, platelets 104, potassium: 3.1,  Left elbow xray: Remote distal humeral and proximal olecranon plate and screw fracture fixation. No evidence of hardware failure. 2. 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 3. There is a linear 22 x less than 1 mm metallic possible needle within the soft tissues medial to the proximal diaphysis of the ulna, appearing within approximately 1.2 cm of the medial proximal forearm skin surface. Recommend clinical correlation. In ED: given potassium, cefepime  and vanc and BC obtained. Lactic acid wnl. Fluid obtained from elbow. TRH asked to admit.   Significant Events: Admitted 10/21/2023 acute on chronic left elbow pain   Significant Labs: WBC 13.0, HgB 9.0, plt 104 ESR 12 CRP 9.3 Blood cx 4-20 Staph Aureus. MSSA  Significant Imaging Studies: Left elbow XR Remote distal humeral and proximal olecranon plate and screw fracture fixation. No evidence of hardware failure. 2. 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 3. There is a linear 22 x less than 1 mm metallic possible needle within the soft tissues medial to the proximal diaphysis of the ulna, appearing within approximately 1.2 cm of  the medial proximal forearm skin surface. Recommend clinical correlation.   Antibiotic Therapy: Anti-infectives (From admission, onward)    Start     Dose/Rate Route Frequency Ordered Stop   10/22/23 1700  vancomycin  (VANCOREADY) IVPB 1250 mg/250 mL  Status:  Discontinued        1,250 mg 166.7 mL/hr over 90 Minutes Intravenous Every 24 hours  10/21/23 1914 10/22/23 0619   10/22/23 0715  ceFAZolin  (ANCEF ) IVPB 2g/100 mL premix        2 g 200 mL/hr over 30 Minutes Intravenous Every 8 hours 10/22/23 0619     10/21/23 2000  cefTRIAXone  (ROCEPHIN ) 2 g in sodium chloride  0.9 % 100 mL IVPB  Status:  Discontinued        2 g 200 mL/hr over 30 Minutes Intravenous Every 24 hours 10/21/23 1907 10/22/23 0619   10/21/23 1530  vancomycin  (VANCOREADY) IVPB 1500 mg/300 mL        1,500 mg 150 mL/hr over 120 Minutes Intravenous  Once 10/21/23 1519 10/21/23 1818   10/21/23 1530  ceFEPIme  (MAXIPIME ) 2 g in sodium chloride  0.9 % 100 mL IVPB        2 g 200 mL/hr over 30 Minutes Intravenous  Once 10/21/23 1519 10/21/23 1649       Procedures: 10-24-2023 I&D left elbow 10-25-2023 TEE 10-25-2023 PICC line placement  Consultants: Orthopedics ID   Hospital Course by Problem: * Acute on chronic pain of left elbow concerning for septic arthritis 10-21-2023 through 10-24-2023. 83 year old presenting to ED with acute onset of pain in his left elbow, decreased mobility, warm to touch and labs concerning for possibly infected joint. He has history of an open fracture in his left elbow in 2007 and had surgery with hardware. About 4 years ago one of the pins came lose and this was taken out.  -admit to [name] -ortho consulted (Dr. Bernard [name])  -joint aspiration obtained by EDP and described as purulent. Gram stain showing gram positive cocci -continue vancomycin . Change cefepime  to rocephin   -check inflammatory markers -check uric acid  -no hx of gout or pseudo gout, but follow synovial fluid for crystals  -trend PCT  -CT/other imaging per ortho  -pain control difficult. Continue home oxycodone  five times/day. Morphine  q 2 hours for severe pain, may need dilaudid , but transferring to [facility].   10-24-2023 continue with IV Ancef  for MSSA septicemia. TEE negative for vegetations. Going to MRI left hip and lumbar spine today.  S/p left elbow hardware removal on  10-23-2023  10-25-2023 awaiting [name] and PICC orders from ID. Family plans on taking pt home. Discussed with pt's dtr Justin [name]  10-26-2023 [name] orders verified. PICC placed. Ready for DC today after PRBC transfusion.  MSSA bacteremia 10-24-2023 ID following. On IV Ancef . [name] to be determined by ID.  10-25-2023 awaiting [name] and PICC orders from ID. Family plans on taking pt home. Discussed with pt's dtr [name].  10-26-2023 Regimen: Cefazolin  2g IV every 8 hours. End date: 12/18/23 (8 weeks from OR 4/22)  Abscess in epidural space of lumbar spine 10-25-2023 lumbar epidural abscess L4-S1 seen on MRI lumbar spine. ID requested Neurosurgery consult. Discussed with Dr. Larrie Po with NS. He does not think pt will need operative intervention at this time but will see patient in consultation today. Dtr beth aware of epidural abscess.  10-26-2023 Regimen: Cefazolin  2g IV every 8 hours. End date: 12/18/23 (8 weeks from OR 4/22). F/u with Dr. Larrie Po with neurosurgery.  Hypokalemia 10-23-2023 through 10-23-2023. Check magnesium . Repleted  in ED. Trend   10-24-2023 resolved. K 4.1 today.  MDS (myelodysplastic syndrome) (HCC) 10-21-2023 through 10-22-2021. -followed by Dr. Rosaline Coma. Diagnosed 12/2021. Myelodysplastic Syndrome, Single Lineage Dysplasia -12/19/2021: Bone marrow biopsy which showed a low-grade myelodysplastic syndrome.  - Started retacrit  20,000 units q 2 weeks  10-24-2023 stable.  10-25-2023 stable.  10-26-2023 2 units PRBC ordered for transfusion due to HgB of 6.9. PRBC will be given prior to DC. Give lasix  20 mg IV after transfusion.  GERD (gastroesophageal reflux disease) 10-24-2023 On TUMs PRN   10-25-2023 stable.  10-26-2023 stable.  Coronary artery disease 10-21-2023 through 10-23-2023 Lexiscan  stress test 2/19 was overall low risk with no ischemia identified.   CT cardiac scoring 07/2018 showed coronary calcium  score of 41 (15th percentile). Continue statin/ASA   10-24-2023  stable.  10-25-2023 stable.  10-26-2023 stable.  Essential hypertension 10-24-2023 Continue norvasc , hytrin   10-25-2023 stable.  10-26-2023 stable.  BPH (benign prostatic hyperplasia) 10-24-2023 Continue flomax  and proscar    10-25-2023 stable.  10-26-2023 stable.  Low back pain 10-21-2023 through 10-24-2023 Continue home oxycodone  10mg  5x/day. Continue steroid taper (2 more days). PMP website verified.   10-24-2023 stable. On oxycodone .  10-25-2023 due to epidural abscess. Continue oxycodone .  10-26-2023 stable.  RLS (restless legs syndrome) 10-24-2023 Continue requip , gabapentin  and sinemet    10-25-2023 stable.  10-26-2023 stable.    Discharge Diagnoses:  Principal Problem:   Acute on chronic pain of left elbow concerning for septic arthritis Active Problems:   MSSA bacteremia   Abscess in epidural space of lumbar spine   RLS (restless legs syndrome)   Low back pain   BPH (benign prostatic hyperplasia)   Essential hypertension   Coronary artery disease   GERD (gastroesophageal reflux disease)   MDS (myelodysplastic syndrome) (HCC)   Hypokalemia   Epidural abscess   Discharge Instructions  Discharge Instructions     [facility] pharmacist to adjust dose for Vancomycin , Aminoglycosides and other anti-infective therapies as requested by physician.   Complete by: As directed    [facility] to provide Cath Flo 2mg    Complete by: As directed    Administer for PICC line occlusion and as ordered by physician for other access device issues.   Ambulatory referral to Neurosurgery   Complete by: As directed    Hospital f/u in 2 weeks for lumbar epidural abscess   Anaphylaxis Kit: Provided to treat any anaphylactic reaction to the medication being provided to the patient if First Dose or when requested by physician   Complete by: As directed    Epinephrine  1mg /ml vial / amp: Administer 0.3mg  (0.39ml) subcutaneously once for moderate to  severe anaphylaxis, nurse to call physician and pharmacy when reaction occurs and call 911 if needed for immediate care   Diphenhydramine  50mg /ml IV vial: Administer 25-50mg  IV/IM PRN for first dose reaction, rash, itching, mild reaction, nurse to call physician and pharmacy when reaction occurs   Sodium Chloride  0.9% NS 500ml IV: Administer if needed for hypovolemic blood pressure drop or as ordered by physician after call to physician with anaphylactic reaction   Call MD for:  difficulty breathing, headache or visual disturbances   Complete by: As directed    Call MD for:  extreme fatigue   Complete by: As directed    Call MD for:  hives   Complete by: As directed    Call MD for:  persistant dizziness or light-headedness   Complete by: As directed    Call MD for:  persistant nausea  and vomiting   Complete by: As directed    Call MD for:  redness, tenderness, or signs of infection (pain, swelling, redness, odor or green/yellow discharge around incision site)   Complete by: As directed    Call MD for:  severe uncontrolled pain   Complete by: As directed    Call MD for:  temperature >100.4   Complete by: As directed    Change dressing on IV access line weekly and PRN   Complete by: As directed    Diet - low sodium heart healthy   Complete by: As directed    Discharge instructions   Complete by: As directed    1. Follow up with your primary care provider in 1-2 weeks following discharge from hospital. 2. Follow up with Infectious Disease as scheduled 3. Outpatient referral made to Neurosurgery for follow up of epidural abscess. Office will call with appointment.   Flush IV access with Sodium Chloride  0.9% and Heparin 10 units/ml or 100 units/ml   Complete by: As directed    Home infusion instructions - [facility]   Complete by: As directed    Instructions: Flush IV access with Sodium Chloride  0.9% and Heparin 10units/ml or 100units/ml   Change dressing on IV access line:  Weekly and PRN   Instructions Cath Flo 2mg : Administer for PICC Line occlusion and as ordered by physician for other access device   [facility] pharmacist to adjust dose for: Vancomycin , Aminoglycosides and other anti-infective therapies as requested by physician   Increase activity slowly   Complete by: As directed    Leave dressing on - Keep it clean, dry, and intact until clinic visit   Complete by: As directed    Home Health will change PICC line dressing as needed.   Method of administration may be changed at the discretion of home infusion pharmacist based upon assessment of the patient and/or caregiver's ability to self-administer the medication ordered   Complete by: As directed       Allergies as of 10/26/2023       Reactions   Levofloxacin Other (See Comments)   REACTION: hands pealed        Medication List     STOP taking these medications    predniSONE  10 MG (21) Tbpk tablet Commonly known as: STERAPRED UNI-PAK 21 TAB       TAKE these medications    acetaminophen  500 MG tablet Commonly known as: TYLENOL  Take 1,000 mg by mouth every 6 (six) hours as needed for mild pain (pain score 1-3) or moderate pain (pain score 4-6).   amLODipine  5 MG tablet Commonly known as: NORVASC  TAKE 1 TABLET BY MOUTH DAILY What changed: when to take this   calcium  carbonate 750 MG chewable tablet Commonly known as: TUMS EX Chew 1,500 mg by mouth daily as needed for heartburn.   carbidopa -levodopa  25-100 MG tablet Commonly known as: SINEMET  IR For breakthrough restless leg, you can take 1 tablet at bedtime.  OK to take extra tab as needed during the day. What changed:  how much to take how to take this when to take this additional instructions   ceFAZolin  IVPB Commonly known as: ANCEF  Inject 2 g into the vein every 8 (eight) hours. Indication:  MSSA septic joint +  lumbar septic arthritis/epidural abscess First Dose: yes Last Day of Therapy:  12/18/23 Labs -  Once weekly:  CBC/D and BMP, Labs - Once weekly: ESR and CRP Method of administration: IV Push or  6g daily as a continuous infusion based on family preference Method of administration may be changed at the discretion of home infusion pharmacist based upon assessment of the patient and/or caregiver's ability to self-administer the medication ordered.   Cholecalciferol  25 MCG (1000 UT) tablet Take 3,000 Units by mouth daily.   finasteride  5 MG tablet Commonly known as: PROSCAR  TAKE 1 TABLET (5 MG TOTAL) BY MOUTH DAILY.   furosemide  20 MG tablet Commonly known as: Lasix  LASIX  20 MG AS NEEDED FOR 3 POUND WEIGHT GAIN OVER NIGHT AND 5 POUND WEIGHT GAIN IN A WEEK.   gabapentin  300 MG capsule Commonly known as: NEURONTIN  Take 1 capsule (300 mg total) by mouth 3 (three) times daily.   ibuprofen 200 MG tablet Commonly known as: ADVIL Take 400 mg by mouth every 6 (six) hours as needed for mild pain (pain score 1-3) or moderate pain (pain score 4-6).   magnesium  hydroxide 400 MG/5ML suspension Commonly known as: MILK OF MAGNESIA Take 15 mLs by mouth daily as needed for mild constipation.   meclizine  12.5 MG tablet Commonly known as: ANTIVERT  TAKE 1 TABLET BY MOUTH 3 TIMES DAILY AS NEEDED FOR DIZZINESS.   ondansetron  4 MG tablet Commonly known as: ZOFRAN  Take 1 tablet (4 mg total) by mouth every 6 (six) hours as needed for nausea or vomiting.   Oxycodone  HCl 10 MG Tabs Take 1 tablet (10 mg total) by mouth 5 (five) times daily for 5 days.   pravastatin  20 MG tablet Commonly known as: PRAVACHOL  TAKE 1 TABLET (20 MG TOTAL) BY MOUTH DAILY.   PRESERVISION AREDS PO Take 2 tablets by mouth 2 (two) times daily.   rOPINIRole  2 MG tablet Commonly known as: REQUIP  TAKE 1 TABLET BY MOUTH AT AT NOON AND 1 TAB AT 4PM AND 1 TAB AT BEDTIME What changed:  how much to take how to take this when to take this additional instructions   SYSTANE OP Place 1 drop into both eyes 2 (two) times daily  as needed (dry eyes). [facility] O[facility]   tamsulosin  0.4 MG Caps capsule Commonly known as: FLOMAX  Take 1 capsule (0.4 mg total) by mouth in the morning.   terazosin  2 MG capsule Commonly known as: HYTRIN  TAKE 1 CAPSULE BY MOUTH EVERY NIGHT AT BEDTIME   Vitamin B-12 1000 MCG Subl DISSOLVE 2 TABLETS IN MOUTH EVERY DAY               Durable Medical Equipment  (From admission, onward)           Start     Ordered   10/24/23 1224  For home use only DME 4 wheeled rolling walker with seat  Once       Question Answer Comment  Patient needs a walker to treat with the following condition Arthritis of elbow, left   Patient needs a walker to treat with the following condition Generalized weakness      10/24/23 1224              Discharge Care Instructions  (From admission, onward)           Start     Ordered   10/26/23 0000  Change dressing on IV access line weekly and PRN  (Home infusion instructions - [facility] )        10/26/23 1048   10/26/23 0000  Leave dressing on - Keep it clean, dry, and intact until clinic visit       Comments: Home Health  will change PICC line dressing as needed.   10/26/23 1051            Follow-up Information     Garry Kansas, MD. Schedule an appointment as soon as possible for a visit in 2 week(s).   Specialty: Neurosurgery Contact information: [address] [place] [address] [phone]         Hardy Lia, MD. Schedule an appointment as soon as possible for a visit in 2 week(s).   Specialty: Orthopedic Surgery Contact information: [address] [place] [address] [phone]                Allergies  Allergen Reactions   Levofloxacin Other (See Comments)    REACTION: hands pealed    Discharge Exam: Vitals:   10/26/23 1428 10/26/23 1443  BP: (!) 163/66 (!) 154/65  Pulse: 86 76  Resp: 18 18  Temp: 98.2 F (36.8 C) 99.5 F (37.5 C)  SpO2:  95%     Physical Exam Vitals and nursing note reviewed.  Constitutional:      General: He is not in acute distress.    Appearance: He is not toxic-appearing or diaphoretic.  HENT:     Head: Normocephalic and atraumatic.     Nose: Nose normal.  Cardiovascular:     Rate and Rhythm: Normal rate and regular rhythm.  Pulmonary:     Effort: Pulmonary effort is normal.     Breath sounds: Normal breath sounds.  Abdominal:     General: Bowel sounds are normal. There is no distension.     Palpations: Abdomen is soft.  Musculoskeletal:     Comments: Left UE in sling. Left hand/forearm swelling.  Skin:    Capillary Refill: Capillary refill takes less than 2 seconds.     Comments: Right UE PICC line  Neurological:     Mental Status: He is alert and oriented to person, place, and time.     The results of significant diagnostics from this hospitalization (including imaging, microbiology, ancillary and laboratory) are listed below for reference.    Microbiology: Recent Results (from the past 240 hours)  Blood Cultures x 2 sites     Status: Abnormal   Collection Time: 10/21/23 11:27 AM   Specimen: Site Not Specified; Blood  Result Value Ref Range Status   Specimen Description   Final    SITE NOT SPECIFIED Performed at [facility], [address], [place], [place] [address]    Special Requests   Final    BOTTLES DRAWN AEROBIC AND ANAEROBIC Blood Culture adequate volume Performed at [facility], [address], [place], [place] [address]    Culture  Setup Time   Final    GRAM POSITIVE COCCI IN CLUSTERS IN BOTH AEROBIC AND ANAEROBIC BOTTLES CRITICAL RESULT CALLED TO, READ BACK BY AND VERIFIED WITH: PHARMD G ABBOTT 10/22/2023 @ 0610 BY AB Performed at [facility], [address], [place], [place] [address]    Culture STAPHYLOCOCCUS AUREUS (A)  Final   Report Status 10/24/2023 FINAL  Final   Organism ID, Bacteria STAPHYLOCOCCUS AUREUS  Final       Susceptibility   Staphylococcus aureus - MIC*    CIPROFLOXACIN >=8 RESISTANT Resistant     ERYTHROMYCIN >=8 RESISTANT Resistant     GENTAMICIN <=0.5 SENSITIVE Sensitive     OXACILLIN 0.5 SENSITIVE Sensitive     TETRACYCLINE <=1 SENSITIVE Sensitive     VANCOMYCIN  1 SENSITIVE Sensitive  TRIMETH /SULFA  >=320 RESISTANT Resistant     CLINDAMYCIN  <=0.25 SENSITIVE Sensitive     RIFAMPIN <=0.5 SENSITIVE Sensitive     Inducible Clindamycin  NEGATIVE Sensitive     LINEZOLID 2 SENSITIVE Sensitive     * STAPHYLOCOCCUS AUREUS  Blood Culture ID Panel (Reflexed)     Status: Abnormal   Collection Time: 10/21/23 11:27 AM  Result Value Ref Range Status   Enterococcus faecalis NOT DETECTED NOT DETECTED Final   Enterococcus Faecium NOT DETECTED NOT DETECTED Final   Listeria monocytogenes NOT DETECTED NOT DETECTED Final   Staphylococcus species DETECTED (A) NOT DETECTED Final    Comment: CRITICAL RESULT CALLED TO, READ BACK BY AND VERIFIED WITH: PHARMD G ABBOTT 10/22/2023 @ 0610 BY AB    Staphylococcus aureus (BCID) DETECTED (A) NOT DETECTED Final    Comment: CRITICAL RESULT CALLED TO, READ BACK BY AND VERIFIED WITH: PHARMD G ABBOTT 10/22/2023 @ 0610 BY AB    Staphylococcus epidermidis NOT DETECTED NOT DETECTED Final   Staphylococcus lugdunensis NOT DETECTED NOT DETECTED Final   Streptococcus species NOT DETECTED NOT DETECTED Final   Streptococcus agalactiae NOT DETECTED NOT DETECTED Final   Streptococcus pneumoniae NOT DETECTED NOT DETECTED Final   Streptococcus pyogenes NOT DETECTED NOT DETECTED Final   A.calcoaceticus-baumannii NOT DETECTED NOT DETECTED Final   Bacteroides fragilis NOT DETECTED NOT DETECTED Final   Enterobacterales NOT DETECTED NOT DETECTED Final   Enterobacter cloacae complex NOT DETECTED NOT DETECTED Final   Escherichia coli NOT DETECTED NOT DETECTED Final   Klebsiella aerogenes NOT DETECTED NOT DETECTED Final   Klebsiella oxytoca NOT DETECTED NOT DETECTED Final    Klebsiella pneumoniae NOT DETECTED NOT DETECTED Final   Proteus species NOT DETECTED NOT DETECTED Final   Salmonella species NOT DETECTED NOT DETECTED Final   Serratia marcescens NOT DETECTED NOT DETECTED Final   Haemophilus influenzae NOT DETECTED NOT DETECTED Final   Neisseria meningitidis NOT DETECTED NOT DETECTED Final   Pseudomonas aeruginosa NOT DETECTED NOT DETECTED Final   Stenotrophomonas maltophilia NOT DETECTED NOT DETECTED Final   Candida albicans NOT DETECTED NOT DETECTED Final   Candida auris NOT DETECTED NOT DETECTED Final   Candida glabrata NOT DETECTED NOT DETECTED Final   Candida krusei NOT DETECTED NOT DETECTED Final   Candida parapsilosis NOT DETECTED NOT DETECTED Final   Candida tropicalis NOT DETECTED NOT DETECTED Final   Cryptococcus neoformans/gattii NOT DETECTED NOT DETECTED Final   Meth resistant mecA/C and MREJ NOT DETECTED NOT DETECTED Final    Comment: Performed at [facility], [address] [place], [place] [address]  Blood Cultures x 2 sites     Status: Abnormal   Collection Time: 10/21/23 12:19 PM   Specimen: BLOOD  Result Value Ref Range Status   Specimen Description   Final    BLOOD BLOOD RIGHT ARM Performed at [facility], [address] [place], [place] [address]    Special Requests   Final    BOTTLES DRAWN AEROBIC AND ANAEROBIC Blood Culture adequate volume Performed at [facility], [address], [place], [place] [address]    Culture  Setup Time   Final    GRAM POSITIVE COCCI IN CLUSTERS ANAEROBIC BOTTLE ONLY CRITICAL VALUE NOTED.  VALUE IS CONSISTENT WITH PREVIOUSLY REPORTED AND CALLED VALUE.    Culture (A)  Final    STAPHYLOCOCCUS AUREUS SUSCEPTIBILITIES PERFORMED ON PREVIOUS CULTURE WITHIN THE LAST 5 DAYS. Performed at [facility], [address], [place], [place] [address]  Report Status 10/24/2023 FINAL  Final  Body fluid culture w Gram Stain     Status: None   Collection  Time: 10/21/23  1:59 PM   Specimen: Synovium; Synovial Fluid  Result Value Ref Range Status   Specimen Description   Final    SYNOVIAL Performed at [facility], [address], [place], [place] [address]    Special Requests   Final    NONE LEFT ELBOW Performed at [facility], [address] [place], [place] [address]    Gram Stain   Final    ABUNDANT WBC PRESENT,BOTH PMN AND MONONUCLEAR ABUNDANT GRAM POSITIVE COCCI Performed at [facility], [address], [place], [place] [address]    Culture ABUNDANT STAPHYLOCOCCUS AUREUS  Final   Report Status 10/23/2023 FINAL  Final   Organism ID, Bacteria STAPHYLOCOCCUS AUREUS  Final      Susceptibility   Staphylococcus aureus - MIC*    CIPROFLOXACIN >=8 RESISTANT Resistant     ERYTHROMYCIN >=8 RESISTANT Resistant     GENTAMICIN <=0.5 SENSITIVE Sensitive     OXACILLIN 0.5 SENSITIVE Sensitive     TETRACYCLINE <=1 SENSITIVE Sensitive     VANCOMYCIN  <=0.5 SENSITIVE Sensitive     TRIMETH /SULFA  >=320 RESISTANT Resistant     CLINDAMYCIN  <=0.25 SENSITIVE Sensitive     RIFAMPIN <=0.5 SENSITIVE Sensitive     Inducible Clindamycin  NEGATIVE Sensitive     LINEZOLID 2 SENSITIVE Sensitive     * ABUNDANT STAPHYLOCOCCUS AUREUS  Gram stain     Status: None   Collection Time: 10/21/23  1:59 PM   Specimen: Synovium; Body Fluid  Result Value Ref Range Status   Specimen Description SYNOVIAL  Final   Special Requests LEFT ELBOW  Final   Gram Stain   Final    ABUNDANT WBC SEEN GRAM POSITIVE COCCI Gram Stain Report Called to,Read Back By and Verified With: Alonna Art RN AT 1529 ON 10/21/2023 BY Anibal Kent Performed at [facility], [address] [place], [place] [address]    Report Status 10/21/2023 FINAL  Final  Aerobic/Anaerobic Culture w Gram Stain (surgical/deep wound)     Status: None (Preliminary result)   Collection Time: 10/23/23  2:37 PM   Specimen: Synovial, Left Elbow; Body Fluid   Result Value Ref Range Status   Specimen Description SYNOVIAL  Final   Special Requests SWAB OF LEFT ELBOW SYNOVIAL FLUID  Final   Gram Stain   Final    ABUNDANT WBC PRESENT, PREDOMINANTLY PMN RARE GRAM POSITIVE COCCI IN PAIRS Gram Stain Report Called to,Read Back By and Verified With: RN B. HICKLIN  [id number]@ 2036 FH Performed at [facility], [address], [place], [place] [address]    Culture   Final    MODERATE STAPHYLOCOCCUS AUREUS NO ANAEROBES ISOLATED; CULTURE IN PROGRESS FOR 5 DAYS    Report Status PENDING  Incomplete   Organism ID, Bacteria STAPHYLOCOCCUS AUREUS  Final      Susceptibility   Staphylococcus aureus - MIC*    CIPROFLOXACIN >=8 RESISTANT Resistant     ERYTHROMYCIN >=8 RESISTANT Resistant     GENTAMICIN <=0.5 SENSITIVE Sensitive     OXACILLIN 0.5 SENSITIVE Sensitive     TETRACYCLINE <=1 SENSITIVE Sensitive     VANCOMYCIN  1 SENSITIVE Sensitive     TRIMETH /SULFA  >=320 RESISTANT Resistant     CLINDAMYCIN  <=0.25 SENSITIVE Sensitive     RIFAMPIN <=0.5 SENSITIVE Sensitive     Inducible Clindamycin  NEGATIVE Sensitive  LINEZOLID 2 SENSITIVE Sensitive     * MODERATE STAPHYLOCOCCUS AUREUS  Aerobic/Anaerobic Culture w Gram Stain (surgical/deep wound)     Status: None (Preliminary result)   Collection Time: 10/23/23  3:20 PM   Specimen: Soft Tissue, Other  Result Value Ref Range Status   Specimen Description TISSUE  Final   Special Requests OLECRANON BURSA  Final   Gram Stain   Final    NO WBC SEEN RARE GRAM POSITIVE COCCI IN PAIRS Gram Stain Report Called to,Read Back By and Verified With: RN Janey Meek [id number] @ [phone] FH Performed at [facility], [address], [place], [place] [address]    Culture   Final    RARE STAPHYLOCOCCUS AUREUS RARE STAPHYLOCOCCUS EPIDERMIDIS NO ANAEROBES ISOLATED; CULTURE IN PROGRESS FOR 5 DAYS    Report Status PENDING  Incomplete   Organism ID, Bacteria STAPHYLOCOCCUS AUREUS  Final   Organism ID, Bacteria  STAPHYLOCOCCUS EPIDERMIDIS  Final      Susceptibility   Staphylococcus aureus - MIC*    CIPROFLOXACIN >=8 RESISTANT Resistant     ERYTHROMYCIN >=8 RESISTANT Resistant     GENTAMICIN <=0.5 SENSITIVE Sensitive     OXACILLIN 0.5 SENSITIVE Sensitive     TETRACYCLINE <=1 SENSITIVE Sensitive     VANCOMYCIN  <=0.5 SENSITIVE Sensitive     TRIMETH /SULFA  >=320 RESISTANT Resistant     CLINDAMYCIN  <=0.25 SENSITIVE Sensitive     RIFAMPIN <=0.5 SENSITIVE Sensitive     Inducible Clindamycin  NEGATIVE Sensitive     LINEZOLID 2 SENSITIVE Sensitive     * RARE STAPHYLOCOCCUS AUREUS   Staphylococcus epidermidis - MIC*    CIPROFLOXACIN <=0.5 SENSITIVE Sensitive     ERYTHROMYCIN <=0.25 SENSITIVE Sensitive     GENTAMICIN <=0.5 SENSITIVE Sensitive     OXACILLIN <=0.25 SENSITIVE Sensitive     TETRACYCLINE 2 SENSITIVE Sensitive     VANCOMYCIN  2 SENSITIVE Sensitive     TRIMETH /SULFA  <=10 SENSITIVE Sensitive     CLINDAMYCIN  <=0.25 SENSITIVE Sensitive     RIFAMPIN <=0.5 SENSITIVE Sensitive     Inducible Clindamycin  NEGATIVE Sensitive     * RARE STAPHYLOCOCCUS EPIDERMIDIS  Culture, blood (Routine X 2) w Reflex to ID Panel     Status: None (Preliminary result)   Collection Time: 10/23/23 10:13 PM   Specimen: BLOOD  Result Value Ref Range Status   Specimen Description BLOOD SITE NOT SPECIFIED  Final   Special Requests   Final    BOTTLES DRAWN AEROBIC AND ANAEROBIC Blood Culture adequate volume   Culture   Final    NO GROWTH 3 DAYS Performed at [facility], [address], [place], [place] [address]    Report Status PENDING  Incomplete  Culture, blood (Routine X 2) w Reflex to ID Panel     Status: None (Preliminary result)   Collection Time: 10/23/23 10:15 PM   Specimen: BLOOD  Result Value Ref Range Status   Specimen Description BLOOD SITE NOT SPECIFIED  Final   Special Requests   Final    BOTTLES DRAWN AEROBIC AND ANAEROBIC Blood Culture adequate volume   Culture   Final    NO GROWTH 3  DAYS Performed at [facility], [address], [place], [place] [address]    Report Status PENDING  Incomplete     Labs: Basic Metabolic Panel: Recent Labs  Lab 10/21/23 1329 10/21/23 1612 10/22/23 1005 10/23/23 0452 10/24/23 0652  NA 137  --  134* 132* 138  K 3.1*  --  3.3* 3.6 4.1  CL 106  --  104 101 106  CO2 23  --  21* 21* 22  GLUCOSE 85  --  175* 112* 149*  BUN 24*  --  19 26* 37*  CREATININE 1.01  --  1.15 1.30* 1.21  CALCIUM  8.6*  --  8.3* 8.2* 8.3*  MG  --  1.7  --  2.5*  --   PHOS  --   --   --  2.3* 3.9   Liver Function Tests: Recent Labs  Lab 10/21/23 1329  AST 16  ALT 11  ALKPHOS 60  BILITOT 1.0  PROT 6.0*  ALBUMIN  3.0*   CBC: Recent Labs  Lab 10/21/23 1127 10/22/23 0654 10/23/23 0452 10/24/23 0652 10/25/23 0656 10/26/23 0330  WBC 13.0* 21.4* 15.3* 9.5 11.7* 9.1  NEUTROABS 6.5  --   --   --   --   --   HGB 9.0* 9.1* 8.7* 7.8* 7.1* 6.9*  HCT 29.9* 29.6* 28.4* 25.2* 23.6* 23.1*  MCV 79.1* 78.1* 76.8* 76.6* 75.9* 78.0*  PLT 104* 90* 80* 69* 106* 121*   CBG: Recent Labs  Lab 10/21/23 1008 10/21/23 1348  GLUCAP 92 78   Sepsis Labs Recent Labs  Lab 10/21/23 1612 10/22/23 0654 10/22/23 1005 10/23/23 0452 10/24/23 0652 10/25/23 0656 10/26/23 0330  PROCALCITON 1.21  --  6.28  --   --   --   --   WBC  --    < >  --  15.3* 9.5 11.7* 9.1   < > = values in this interval not displayed.   Procedures/Studies: MR Lumbar Spine W Wo Contrast Result Date: 10/24/2023 CLINICAL DATA:  Back pain. EXAM: MRI LUMBAR SPINE WITHOUT AND WITH CONTRAST TECHNIQUE: Multiplanar and multiecho pulse sequences of the lumbar spine were obtained without and with intravenous contrast. CONTRAST:  8mL GADAVIST  GADOBUTROL  1 MMOL/ML IV SOLN COMPARISON:  None Available. FINDINGS: Segmentation:  Standard. Alignment: Dextroscoliosis with apex at L4. Grade 1 retrolisthesis at L3-4 and L4-5 and grade 1 anterolisthesis at L5-S1. Vertebrae: There is opposing endplate edema  at L1-2 with a small amount of fluid within the posterior disc space. There is also endplate edema at N62-95, L4-5 and L5-S1. There is L5-S1 facet edema. No acute fracture. Conus medullaris and cauda equina: Conus extends to the T12 level. Conus and cauda equina appear normal. Paraspinal and other soft tissues: There is a left eccentric and dorsal heterogeneous and peripherally contrast-enhancing collection that extends from L4-S1. Small fluid collections within the left dorsal paraspinous muscles. There is edema within the left psoas muscles with an area of probable phlegmonous change. Disc levels: T12-L1: Unremarkable. L1-L2: Small disc bulge with endplate spurring. No spinal canal stenosis. Severe right neural foraminal stenosis. L2-L3: Small disc bulge with bilateral endplate spurring. No spinal canal stenosis. Mild right neural foraminal stenosis. L3-L4: Left asymmetric disc bulge with endplate spurring. Left lateral recess narrowing without central spinal canal stenosis. Mild right and moderate left neural foraminal stenosis. L4-L5: Small ventral component of the above described fluid collection. Left lateral recess narrowing without central spinal canal stenosis. Mass effect on the left L5 nerve root in the lateral recess. Severe left neural foraminal stenosis. L5-S1: Moderate facet arthrosis with left facet edema. As above, heterogeneous collection along the dorsal and left side of the epidural space. There is narrowing of the left lateral recess with mass effect on the left S1 nerve root. No central spinal canal stenosis. Moderate right and severe left neural foraminal stenosis.  IMPRESSION: 1. Findings concerning for septic arthritis at the left L5-S1 facet. 2. 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 3. Edema within the left psoas muscles with an area of probable phlegmonous change. Small fluid collections in the left dorsal paraspinous muscles, consistent with myositis. 4. Severe right L1-2,  left L4-5 and left L5-S1 neural foraminal stenosis. 5. Left lateral recess narrowing at L4-5 and L5-S1 with mass effect on the left L5 and S1 nerve roots. Electronically Signed   By: Juanetta Nordmann M.D.   On: 10/24/2023 22:06   MR HIP LEFT W WO CONTRAST Result Date: 10/24/2023 CLINICAL DATA:  Concern for septic arthritis. Known MSSA bacteremia. EXAM: MRI OF THE LEFT HIP WITHOUT AND WITH CONTRAST TECHNIQUE: Multiplanar, multisequence MR imaging was performed both before and after administration of intravenous contrast. CONTRAST:  8mL GADAVIST  GADOBUTROL  1 MMOL/ML IV SOLN COMPARISON:  None Available. FINDINGS: Soft tissue and Muscle: There is diffuse subcutaneous edema extending along the visualized lower abdominal wall and into the bilateral proximal hips. There is relatively symmetric bilateral diffuse intramuscular and interstitial edema of the visualized proximal thighs, most notably involving the bilateral gluteus medius, adductor and anterior compartment musculature. No significant associated enhancement. No discrete loculated intramuscular collection identified.Hamstring tendon origins are intact.Gluteal cuff tendon insertions are intact.Trace pelvic free fluid. Bones/Hip: No acute osseous abnormality. Femoral heads are seated within the acetabula. No hip joint effusion. No periarticular edema or enhancement. Diffuse red marrow reconversion. IMPRESSION: 1. No evidence of septic arthritis of the left hip. No acute osseous abnormality. 2. Diffuse subcutaneous edema of the visualized lower abdominal wall extending through the bilateral proximal hips with symmetric bilateral relatively diffuse intramuscular and interstitial edema of the proximal thighs. No evidence of enhancement. No fluid collection. These findings are nonspecific and could reflect systemic inflammatory response in the setting of known MSSA bacteremia, non-suppurative myositis, or volume overload. 3. Trace nonspecific pelvic free fluid.  Electronically Signed   By: Mannie Seek M.D.   On: 10/24/2023 20:10   ECHO TEE Result Date: 10/24/2023    TRANSESOPHOGEAL ECHO REPORT   Patient Name:   Caston Coopersmith [facility]. Date of Exam: 10/24/2023 Medical Rec #:  [id number]              Height:       69.0 in Accession #:    [id number]             Weight:       168.2 lb Date of Birth:  08/12/1940              BSA:          1.919 m Patient Age:    82 years               BP:           152/68 mmHg Patient Gender: M                      HR:           63 bpm. Exam Location:  Inpatient Procedure: Transesophageal Echo, 3D Echo, Color Doppler and Cardiac Doppler            (Both Spectral and Color Flow Doppler were utilized during            procedure). Indications:     Fever  History:         Patient has prior history of Echocardiogram examinations, most  recent 10/16/2023. CAD; Risk Factors:Hypertension.  Sonographer:     Sherline Distel Senior RDCS Referring Phys:  [id number] Hugh Madura Diagnosing Phys: Dorothye Gathers MD PROCEDURE: After discussion of the risks and benefits of a TEE, an informed consent was obtained from the patient. The transesophogeal probe was passed without difficulty through the esophogus of the patient. Sedation performed by different physician. The patient was monitored while under deep sedation. Anesthestetic sedation was provided intravenously by Anesthesiology: 193mg  of Propofol , 80mg  of Lidocaine . The patient's vital signs; including heart rate, blood pressure, and oxygen saturation; remained stable throughout the procedure. The patient developed no complications during the procedure.  IMPRESSIONS  1. Left ventricular ejection fraction, by estimation, is 55 to 60%. The left ventricle has normal function. The left ventricle has no regional wall motion abnormalities.  2. Right ventricular systolic function is normal. The right ventricular size is normal.  3. No left atrial/left atrial appendage thrombus was detected.  4. The mitral valve  is normal in structure. Mild mitral valve regurgitation. No evidence of mitral stenosis.  5. The aortic valve is tricuspid. Aortic valve regurgitation is not visualized. No aortic stenosis is present.  6. There is Moderate (Grade III) plaque.  7. The inferior vena cava is normal in size with greater than 50% respiratory variability, suggesting right atrial pressure of 3 mmHg.  8. 3D performed of the LAA and demonstrates No thrombus. Conclusion(s)/Recommendation(s): No evidence of vegetation/infective endocarditis on this transesophageael echocardiogram. FINDINGS  Left Ventricle: Left ventricular ejection fraction, by estimation, is 55 to 60%. The left ventricle has normal function. The left ventricle has no regional wall motion abnormalities. The left ventricular internal cavity size was normal in size. There is  no left ventricular hypertrophy. Right Ventricle: The right ventricular size is normal. No increase in right ventricular wall thickness. Right ventricular systolic function is normal. Left Atrium: Left atrial size was normal in size. No left atrial/left atrial appendage thrombus was detected. Right Atrium: Right atrial size was normal in size. Pericardium: There is no evidence of pericardial effusion. Mitral Valve: The mitral valve is normal in structure. Mild mitral valve regurgitation. No evidence of mitral valve stenosis. Tricuspid Valve: The tricuspid valve is normal in structure. Tricuspid valve regurgitation is trivial. No evidence of tricuspid stenosis. Aortic Valve: The aortic valve is tricuspid. Aortic valve regurgitation is not visualized. No aortic stenosis is present. Pulmonic Valve: The pulmonic valve was normal in structure. Pulmonic valve regurgitation is not visualized. No evidence of pulmonic stenosis. Aorta: The aortic root is normal in size and structure. There is moderate (Grade III) plaque. Venous: The inferior vena cava is normal in size with greater than 50% respiratory variability,  suggesting right atrial pressure of 3 mmHg. IAS/Shunts: No atrial level shunt detected by color flow Doppler. Additional Comments: Spectral Doppler performed. Dorothye Gathers MD Electronically signed by Dorothye Gathers MD Signature Date/Time: 10/24/2023/12:13:13 PM    Final    US  EKG SITE RITE Result Date: 10/24/2023 If Site Rite image not attached, placement could not be confirmed due to current cardiac rhythm.  EP STUDY Result Date: 10/24/2023 See surgical note for result.  DG Elbow 2 Views Left Result Date: 10/23/2023 CLINICAL DATA:  Septic joint EXAM: LEFT ELBOW - 2 VIEW COMPARISON:  Left elbow x-ray 10/21/2023. FINDINGS: There is diffuse soft tissue swelling surrounding the elbow. Orthopedic hardware has been removed within the proximal ulna and distal humerus. A single horizontal screw persists in the distal humerus. The bones are diffusely osteopenic. No definite  acute fracture identified. There are degenerative changes between the distal humerus and ulna. The radial head appears subluxed/dislocated anteriorly. There is some periosteal reaction along the posterior aspect of the distal humerus. IMPRESSION: 1. Diffuse soft tissue swelling surrounding the elbow. 2. The radial head appears subluxed/dislocated anteriorly. 3. No definite acute fracture identified. 4. Orthopedic hardware has been removed within the proximal ulna and distal humerus. A single horizontal screw persists in the distal humerus. Electronically Signed   By: Tyron Gallon M.D.   On: 10/23/2023 20:08   DG ELBOW COMPLETE LEFT (3+VIEW) Result Date: 10/23/2023 CLINICAL DATA:  Elective surgery.  Removal of hardware. EXAM: LEFT ELBOW - COMPLETE 3+ VIEW COMPARISON:  Radiograph 10/21/2023 FINDINGS: Four fluoroscopic spot views of the left elbow submitted from the operating room. The previous hardware has been removed except for a single screw in the distal humerus. Fluoroscopic time 20.7 seconds. Dose 1.02 mGy. IMPRESSION: Intraoperative  fluoroscopy during hardware removal. Electronically Signed   By: Chadwick Colonel M.D.   On: 10/23/2023 16:18   DG C-Arm 1-60 Min-No Report Result Date: 10/23/2023 Fluoroscopy was utilized by the requesting physician.  No radiographic interpretation.   DG C-Arm 1-60 Min-No Report Result Date: 10/23/2023 Fluoroscopy was utilized by the requesting physician.  No radiographic interpretation.   DG Elbow Complete Left Result Date: 10/21/2023 CLINICAL DATA:  Swelling. Hot, red, mobile. Left elbow pain radiating down left arm. Left elbow surgery years ago. EXAM: LEFT ELBOW - COMPLETE 3+ VIEW COMPARISON:  Left elbow radiographs 12/20/2007 FINDINGS: Remote distal lateral and distal medial humeral plate and screw fracture fixation, and proximal posterior olecranon plate and screw fracture fixation, also seen on 12/20/2007 prior radiographs. Bone overlap limits evaluation, however there appears to be severe capitellum-radial head and trochlea-coronoid joint space narrowing. Subchondral sclerosis is seen within the proximal coronoid. There may be partial mid medial elbow arthrodesis. No acute fracture or dislocation.  No evidence of hardware failure. No definite elbow joint effusion. There is a linear 22 x less than 1 mm metallic possible needle within the soft tissues medial to the proximal diaphysis of the ulna, appearing within approximately 1.2 cm of the medial proximal forearm skin surface. IMPRESSION: 1. Remote distal humeral and proximal olecranon plate and screw fracture fixation. No evidence of hardware failure. 2. 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 3. There is a linear 22 x less than 1 mm metallic possible needle within the soft tissues medial to the proximal diaphysis of the ulna, appearing within approximately 1.2 cm of the medial proximal forearm skin surface. Recommend clinical correlation. Electronically Signed   By: Bertina Broccoli M.D.   On: 10/21/2023 12:15    ECHOCARDIOGRAM COMPLETE Result Date: 10/16/2023    ECHOCARDIOGRAM REPORT   Patient Name:   Advith Martine [facility]. Date of Exam: 10/16/2023 Medical Rec #:  [id number]              Height:       69.0 in Accession #:    [id number]             Weight:       162.4 lb Date of Birth:  Aug 26, 1940              BSA:          1.891 m Patient Age:    82 years               BP:           112/66 mmHg Patient  Gender: M                      HR:           77 bpm. Exam Location:  Outpatient Procedure: 2D Echo, 3D Echo, Cardiac Doppler, Color Doppler and Strain Analysis            (Both Spectral and Color Flow Doppler were utilized during            procedure). Indications:    R60.0 Lower extremity edema  History:        Patient has prior history of Echocardiogram examinations, most                 recent 10/22/2019. CAD, Signs/Symptoms:Edema; Risk                 Factors:Hypertension and Former Smoker. Patient denies chest                 pain and SOB. He does have bilateral leg edema.  Sonographer:    Richarda Chance RVT, RDCS (AE), RDMS Referring Phys: [id number] [place] L [place] IMPRESSIONS  1. Left ventricular ejection fraction, by estimation, is 55 to 60%. The left ventricle has normal function. The left ventricle has no regional wall motion abnormalities. Left ventricular diastolic parameters were normal. The average left ventricular global longitudinal strain is -18.7 %. The global longitudinal strain is normal.  2. Right ventricular systolic function is normal. The right ventricular size is normal.  3. Left atrial size was moderately dilated.  4. The mitral valve is abnormal. Mild mitral valve regurgitation. No evidence of mitral stenosis.  5. The aortic valve is tricuspid. There is mild calcification of the aortic valve. There is mild thickening of the aortic valve. Aortic valve regurgitation is trivial. Aortic valve sclerosis is present, with no evidence of aortic valve stenosis.  6. The inferior vena cava is dilated in size  with >50% respiratory variability, suggesting right atrial pressure of 8 mmHg. Comparison(s): EF 60%, mild LVH. FINDINGS  Left Ventricle: Left ventricular ejection fraction, by estimation, is 55 to 60%. The left ventricle has normal function. The left ventricle has no regional wall motion abnormalities. The average left ventricular global longitudinal strain is -18.7 %. Strain was performed and the global longitudinal strain is normal. The left ventricular internal cavity size was normal in size. There is no left ventricular hypertrophy. Left ventricular diastolic parameters were normal. Right Ventricle: The right ventricular size is normal. No increase in right ventricular wall thickness. Right ventricular systolic function is normal. Left Atrium: Left atrial size was moderately dilated. Right Atrium: Right atrial size was normal in size. Pericardium: There is no evidence of pericardial effusion. Mitral Valve: The mitral valve is abnormal. There is moderate thickening of the mitral valve leaflet(s). Mild mitral valve regurgitation. No evidence of mitral valve stenosis. Tricuspid Valve: The tricuspid valve is normal in structure. Tricuspid valve regurgitation is mild . No evidence of tricuspid stenosis. Aortic Valve: The aortic valve is tricuspid. There is mild calcification of the aortic valve. There is mild thickening of the aortic valve. Aortic valve regurgitation is trivial. Aortic valve sclerosis is present, with no evidence of aortic valve stenosis. Aortic valve mean gradient measures 3.0 mmHg. Aortic valve peak gradient measures 6.7 mmHg. Aortic valve area, by VTI measures 3.32 cm. Pulmonic Valve: The pulmonic valve was normal in structure. Pulmonic valve regurgitation is mild. No evidence of pulmonic stenosis. Aorta: The aortic root is normal  in size and structure. Venous: The inferior vena cava is dilated in size with greater than 50% respiratory variability, suggesting right atrial pressure of 8 mmHg.  IAS/Shunts: No atrial level shunt detected by color flow Doppler. Additional Comments: 3D was performed not requiring image post processing on an independent workstation and was normal.  LEFT VENTRICLE PLAX 2D LVIDd:         4.38 cm   Diastology LVIDs:         2.90 cm   LV e' medial:    7.37 cm/s LV PW:         0.88 cm   LV E/e' medial:  11.5 LV IVS:        0.82 cm   LV e' lateral:   14.00 cm/s LVOT diam:     2.29 cm   LV E/e' lateral: 6.1 LV SV:         87 LV SV Index:   46        2D Longitudinal Strain LVOT Area:     4.12 cm  2D Strain GLS Avg:     -18.7 %                           3D Volume EF:                          3D EF:        57 %                          LV EDV:       161 ml                          LV ESV:       69 ml                          LV SV:        92 ml RIGHT VENTRICLE RV S prime:     11.20 cm/s TAPSE (M-mode): 2.6 cm LEFT ATRIUM              Index        RIGHT ATRIUM           Index LA diam:        4.88 cm  2.58 cm/m   RA Area:     19.60 cm LA Vol (A2C):   156.0 ml 82.50 ml/m  RA Volume:   53.70 ml  28.40 ml/m LA Vol (A4C):   139.0 ml 73.51 ml/m LA Biplane Vol: 149.0 ml 78.79 ml/m  AORTIC VALVE                    PULMONIC VALVE AV Area (Vmax):    2.98 cm     PV Vmax:       0.98 m/s AV Area (Vmean):   3.21 cm     PV Peak grad:  3.9 mmHg AV Area (VTI):     3.32 cm AV Vmax:           129.00 cm/s AV Vmean:          83.100 cm/s AV VTI:            0.262 m AV Peak Grad:      6.7 mmHg AV Mean  Grad:      3.0 mmHg LVOT Vmax:         93.20 cm/s LVOT Vmean:        64.800 cm/s LVOT VTI:          0.211 m LVOT/AV VTI ratio: 0.81  AORTA Ao Root diam: 3.69 cm Ao Asc diam:  3.50 cm Ao Arch diam: 2.9 cm MITRAL VALVE MV Area (PHT): 3.89 cm    SHUNTS MV Decel Time: 195 msec    Systemic VTI:  0.21 m MV E velocity: 85.00 cm/s  Systemic Diam: 2.29 cm MV A velocity: 60.30 cm/s MV E/A ratio:  1.41 Janelle Mediate MD Electronically signed by Janelle Mediate MD Signature Date/Time: 10/16/2023/11:52:58 AM    Final      Time coordinating discharge: 50 mins  SIGNED:  Unk Garb, DO [facility] 10/26/23, 2:59 PM

## 2023-10-26 NOTE — Progress Notes (Signed)
 Providing Compassionate, Quality Care - Together   Subjective: Patient reports some back pain. His daughter is at the bedside and tells me [name] will be receiving blood this morning due to his hemoglobin of 6.9.  Objective: Vital signs in last 24 hours: Temp:  [97.7 F (36.5 C)-98.8 F (37.1 C)] 98.8 F (37.1 C) (04/25 0737) Pulse Rate:  [60-77] 70 (04/25 0737) Resp:  [18] 18 (04/25 0513) BP: (150-175)/(59-69) 150/69 (04/25 0737) SpO2:  [94 %-98 %] 96 % (04/25 0737)  Intake/Output from previous day: 04/24 0701 - 04/25 0700 In: 636 [P.O.:636] Out: -  Intake/Output this shift: No intake/output data recorded.  Alert and oriented PERRLA Speech Clear RUE extremity S/S normal, LUE limited due to recent surgery  Lab Results: Recent Labs    10/25/23 0656 10/26/23 0330  WBC 11.7* 9.1  HGB 7.1* 6.9*  HCT 23.6* 23.1*  PLT 106* 121*   BMET Recent Labs    10/24/23 0652  NA 138  K 4.1  CL 106  CO2 22  GLUCOSE 149*  BUN 37*  CREATININE 1.21  CALCIUM  8.3*    Studies/Results: MR Lumbar Spine W Wo Contrast Result Date: 10/24/2023 CLINICAL DATA:  Back pain. EXAM: MRI LUMBAR SPINE WITHOUT AND WITH CONTRAST TECHNIQUE: Multiplanar and multiecho pulse sequences of the lumbar spine were obtained without and with intravenous contrast. CONTRAST:  8mL GADAVIST  GADOBUTROL  1 MMOL/ML IV SOLN COMPARISON:  None Available. FINDINGS: Segmentation:  Standard. Alignment: Dextroscoliosis with apex at L4. Grade 1 retrolisthesis at L3-4 and L4-5 and grade 1 anterolisthesis at L5-S1. Vertebrae: There is opposing endplate edema at Z3-0 with a small amount of fluid within the posterior disc space. There is also endplate edema at Q65-78, L4-5 and L5-S1. There is L5-S1 facet edema. No acute fracture. Conus medullaris and cauda equina: Conus extends to the T12 level. Conus and cauda equina appear normal. Paraspinal and other soft tissues: There is a left eccentric and dorsal heterogeneous and  peripherally contrast-enhancing collection that extends from L4-S1. Small fluid collections within the left dorsal paraspinous muscles. There is edema within the left psoas muscles with an area of probable phlegmonous change. Disc levels: T12-L1: Unremarkable. L1-L2: Small disc bulge with endplate spurring. No spinal canal stenosis. Severe right neural foraminal stenosis. L2-L3: Small disc bulge with bilateral endplate spurring. No spinal canal stenosis. Mild right neural foraminal stenosis. L3-L4: Left asymmetric disc bulge with endplate spurring. Left lateral recess narrowing without central spinal canal stenosis. Mild right and moderate left neural foraminal stenosis. L4-L5: Small ventral component of the above described fluid collection. Left lateral recess narrowing without central spinal canal stenosis. Mass effect on the left L5 nerve root in the lateral recess. Severe left neural foraminal stenosis. L5-S1: Moderate facet arthrosis with left facet edema. As above, heterogeneous collection along the dorsal and left side of the epidural space. There is narrowing of the left lateral recess with mass effect on the left S1 nerve root. No central spinal canal stenosis. Moderate right and severe left neural foraminal stenosis. IMPRESSION: 1. Findings concerning for septic arthritis at the left L5-S1 facet. 2. 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 3. Edema within the left psoas muscles with an area of probable phlegmonous change. Small fluid collections in the left dorsal paraspinous muscles, consistent with myositis. 4. Severe right L1-2, left L4-5 and left L5-S1 neural foraminal stenosis. 5. Left lateral recess narrowing at L4-5 and L5-S1 with mass effect on the left L5 and S1 nerve roots. Electronically Signed  By: Juanetta Nordmann M.D.   On: 10/24/2023 22:06   MR HIP LEFT W WO CONTRAST Result Date: 10/24/2023 CLINICAL DATA:  Concern for septic arthritis. Known MSSA bacteremia. EXAM: MRI OF THE LEFT  HIP WITHOUT AND WITH CONTRAST TECHNIQUE: Multiplanar, multisequence MR imaging was performed both before and after administration of intravenous contrast. CONTRAST:  8mL GADAVIST  GADOBUTROL  1 MMOL/ML IV SOLN COMPARISON:  None Available. FINDINGS: Soft tissue and Muscle: There is diffuse subcutaneous edema extending along the visualized lower abdominal wall and into the bilateral proximal hips. There is relatively symmetric bilateral diffuse intramuscular and interstitial edema of the visualized proximal thighs, most notably involving the bilateral gluteus medius, adductor and anterior compartment musculature. No significant associated enhancement. No discrete loculated intramuscular collection identified.Hamstring tendon origins are intact.Gluteal cuff tendon insertions are intact.Trace pelvic free fluid. Bones/Hip: No acute osseous abnormality. Femoral heads are seated within the acetabula. No hip joint effusion. No periarticular edema or enhancement. Diffuse red marrow reconversion. IMPRESSION: 1. No evidence of septic arthritis of the left hip. No acute osseous abnormality. 2. Diffuse subcutaneous edema of the visualized lower abdominal wall extending through the bilateral proximal hips with symmetric bilateral relatively diffuse intramuscular and interstitial edema of the proximal thighs. No evidence of enhancement. No fluid collection. These findings are nonspecific and could reflect systemic inflammatory response in the setting of known MSSA bacteremia, non-suppurative myositis, or volume overload. 3. Trace nonspecific pelvic free fluid. Electronically Signed   By: Mannie Seek M.D.   On: 10/24/2023 20:10   ECHO TEE Result Date: 10/24/2023    TRANSESOPHOGEAL ECHO REPORT   Patient Name:   Justin [name] [facility]. Date of Exam: 10/24/2023 Medical Rec #:  [id number]              Height:       69.0 in Accession #:    [id number]             Weight:       168.2 lb Date of Birth:  1940/08/26              BSA:           1.919 m Patient Age:    83 years               BP:           152/68 mmHg Patient Gender: M                      HR:           63 bpm. Exam Location:  Inpatient Procedure: Transesophageal Echo, 3D Echo, Color Doppler and Cardiac Doppler            (Both Spectral and Color Flow Doppler were utilized during            procedure). Indications:     Fever  History:         Patient has prior history of Echocardiogram examinations, most                  recent 10/16/2023. CAD; Risk Factors:Hypertension.  Sonographer:     Sherline Distel Senior RDCS Referring Phys:  [id number] Hugh Madura Diagnosing Phys: Dorothye Gathers MD PROCEDURE: After discussion of the risks and benefits of a TEE, an informed consent was obtained from the patient. The transesophogeal probe was passed without difficulty through the esophogus of the patient. Sedation performed by different physician. The patient was monitored while under deep  sedation. Anesthestetic sedation was provided intravenously by Anesthesiology: 193mg  of Propofol , 80mg  of Lidocaine . The patient's vital signs; including heart rate, blood pressure, and oxygen saturation; remained stable throughout the procedure. The patient developed no complications during the procedure.  IMPRESSIONS  1. Left ventricular ejection fraction, by estimation, is 55 to 60%. The left ventricle has normal function. The left ventricle has no regional wall motion abnormalities.  2. Right ventricular systolic function is normal. The right ventricular size is normal.  3. No left atrial/left atrial appendage thrombus was detected.  4. The mitral valve is normal in structure. Mild mitral valve regurgitation. No evidence of mitral stenosis.  5. The aortic valve is tricuspid. Aortic valve regurgitation is not visualized. No aortic stenosis is present.  6. There is Moderate (Grade III) plaque.  7. The inferior vena cava is normal in size with greater than 50% respiratory variability, suggesting right atrial pressure of 3 mmHg.   8. 3D performed of the LAA and demonstrates No thrombus. Conclusion(s)/Recommendation(s): No evidence of vegetation/infective endocarditis on this transesophageael echocardiogram. FINDINGS  Left Ventricle: Left ventricular ejection fraction, by estimation, is 55 to 60%. The left ventricle has normal function. The left ventricle has no regional wall motion abnormalities. The left ventricular internal cavity size was normal in size. There is  no left ventricular hypertrophy. Right Ventricle: The right ventricular size is normal. No increase in right ventricular wall thickness. Right ventricular systolic function is normal. Left Atrium: Left atrial size was normal in size. No left atrial/left atrial appendage thrombus was detected. Right Atrium: Right atrial size was normal in size. Pericardium: There is no evidence of pericardial effusion. Mitral Valve: The mitral valve is normal in structure. Mild mitral valve regurgitation. No evidence of mitral valve stenosis. Tricuspid Valve: The tricuspid valve is normal in structure. Tricuspid valve regurgitation is trivial. No evidence of tricuspid stenosis. Aortic Valve: The aortic valve is tricuspid. Aortic valve regurgitation is not visualized. No aortic stenosis is present. Pulmonic Valve: The pulmonic valve was normal in structure. Pulmonic valve regurgitation is not visualized. No evidence of pulmonic stenosis. Aorta: The aortic root is normal in size and structure. There is moderate (Grade III) plaque. Venous: The inferior vena cava is normal in size with greater than 50% respiratory variability, suggesting right atrial pressure of 3 mmHg. IAS/Shunts: No atrial level shunt detected by color flow Doppler. Additional Comments: Spectral Doppler performed. Dorothye Gathers MD Electronically signed by Dorothye Gathers MD Signature Date/Time: 10/24/2023/12:13:13 PM    Final    US  EKG SITE RITE Result Date: 10/24/2023 If Site Rite image not attached, placement could not be confirmed  due to current cardiac rhythm.  EP STUDY Result Date: 10/24/2023 See surgical note for result.   Assessment/Plan: Patient with L5-S1 epidural abscess likely due to staph infection in LUE. Recommend antibiotic therapy per ID's recommendations. Patient should follow up in 1-2 weeks with Dr. Larrie Po as an outpatient. Neurosurgery will sign off at this time. Please reconsult if new Neurosurgical needs arise.   LOS: 5 days    I am in communication with my attending and they agree with the plan for this patient.   Henreitta Locus, DNP, AGNP-C Nurse Practitioner  [facility] [address], [place], [place] [address] P: [phone]    F: [phone]  10/26/2023, 8:12 AM

## 2023-10-26 NOTE — Progress Notes (Signed)
 Orthopaedic Trauma Service Progress Note  Patient ID: Justin [name] [facility] MRN: [id number] DOB/AGE: [birth year] 83 y.o.  Subjective:  Getting 2 units PRBCs today Possible home later today  Conservative management for epidural abscess/septic facet arthritis   Slight increase in L elbow pain today but doing ok    ROS As above  Objective:   VITALS:   Vitals:   10/26/23 1112 10/26/23 1126 10/26/23 1127 10/26/23 1345  BP: (!) 165/71 (!) 167/70 (!) 167/70 (!) 163/66  Pulse: 76 76 76 86  Resp: 17 17 17 18   Temp: 98.4 F (36.9 C) 98.1 F (36.7 C) 98.1 F (36.7 C) 98.2 F (36.8 C)  TempSrc: Oral Oral  Oral  SpO2:  100%  99%  Weight:      Height:        Estimated body mass index is 24.84 kg/m as calculated from the following:   Height as of this encounter: 5\' 9"  (1.753 m).   Weight as of this encounter: 76.3 kg.   Intake/Output      04/24 0701 04/25 0700 04/25 0701 04/26 0700   P.O. 636    I.V. (mL/kg)     Blood  0   IV Piggyback     Total Intake(mL/kg) 636 (8.3) 0 (0)   Urine (mL/kg/hr)  600 (1.1)   Total Output  600   Net +636 -600          LABS  Results for orders placed or performed during the hospital encounter of 10/21/23 (from the past 24 hours)  CBC     Status: Abnormal   Collection Time: 10/26/23  3:30 AM  Result Value Ref Range   WBC 9.1 4.0 - 10.5 K/uL   RBC 2.96 (L) 4.22 - 5.81 MIL/uL   Hemoglobin 6.9 (LL) 13.0 - 17.0 g/dL   HCT 40.9 (L) 81.1 - 91.4 %   MCV 78.0 (L) 80.0 - 100.0 fL   MCH 23.3 (L) 26.0 - 34.0 pg   MCHC 29.9 (L) 30.0 - 36.0 g/dL   RDW 78.2 (H) 95.6 - 21.3 %   Platelets 121 (L) 150 - 400 K/uL   nRBC 0.0 0.0 - 0.2 %  Prepare RBC (crossmatch)     Status: None   Collection Time: 10/26/23  7:15 AM  Result Value Ref Range   Order Confirmation      ORDER PROCESSED BY BLOOD BANK Performed at [facility], [address], [place], [place] [address]       PHYSICAL EXAM:  Gen: NAD, sitting up in bed, A&O x 3, very pleasant  Ext:       Left upper extremity              Dressing c/d/I  Dressing changed   Incision is pristine   No surrounding erythema, no active drainage  Moderate swelling to the left hand             Ext warm              Baseline clawhand deformity        Assessment/Plan: 2 Days Post-Op   Principal Problem:   Acute on chronic pain of left elbow concerning for septic arthritis Active Problems:   RLS (restless legs syndrome)   Low back pain   BPH (benign prostatic  hyperplasia)   Essential hypertension   Coronary artery disease   GERD (gastroesophageal reflux disease)   MDS (myelodysplastic syndrome) (HCC)   Hypokalemia   MSSA bacteremia   Abscess in epidural space of lumbar spine   Epidural abscess   Anti-infectives (From admission, onward)    Start     Dose/Rate Route Frequency Ordered Stop   10/26/23 0000  ceFAZolin  (ANCEF ) IVPB        2 g Intravenous Every 8 hours 10/26/23 1048 12/19/23 2359   10/22/23 1700  vancomycin  (VANCOREADY) IVPB 1250 mg/250 mL  Status:  Discontinued        1,250 mg 166.7 mL/hr over 90 Minutes Intravenous Every 24 hours 10/21/23 1914 10/22/23 0619   10/22/23 0715  ceFAZolin  (ANCEF ) IVPB 2g/100 mL premix        2 g 200 mL/hr over 30 Minutes Intravenous Every 8 hours 10/22/23 0619     10/21/23 2000  cefTRIAXone  (ROCEPHIN ) 2 g in sodium chloride  0.9 % 100 mL IVPB  Status:  Discontinued        2 g 200 mL/hr over 30 Minutes Intravenous Every 24 hours 10/21/23 1907 10/22/23 0619   10/21/23 1530  vancomycin  (VANCOREADY) IVPB 1500 mg/300 mL        1,500 mg 150 mL/hr over 120 Minutes Intravenous  Once 10/21/23 1519 10/21/23 1818   10/21/23 1530  ceFEPIme  (MAXIPIME ) 2 g in sodium chloride  0.9 % 100 mL IVPB        2 g 200 mL/hr over 30 Minutes Intravenous  Once 10/21/23 1519 10/21/23 1649     .  POD/HD#: 82  83 y/o male with L elbow septic arthritis, septic olecranon  busitis, retained hardware L distal humerus and olecranon    -L elbow septic arthritis, septic olecranon busitis, retained hardware L distal humerus and olecranon s/p ROH L distal humerus and ulna, I&D L elbow, capsulectomy, bursectomy  No formal restrictions L elbow Dressing changed today   Can change every other day starting on 10/28/2023  Okay to leave open to the air once there is no drainage  Okay to clean with soap and water  only Baseline elbow contracture and clawhand deformity Ice PRN              Elevate extremity to assist with swelling control    Aggressive elevation of the left upper extremity.  Think hand above elbow and elbow above heart             Sling for comfort only    - Lumbar epidural abscess, septic facet joint, chronic degenerative changes lumbar spine, L psoas myositis              Per NS    - Pain management:             Multimodal    - ABL anemia/Hemodynamics             2 units today     - ID:              Abx per ID   - Dispo:             Orthopedic issues are stable  Okay to discharge from orthopedic standpoint  Follow-up with orthopedic trauma service in 2 weeks   Geroldine Kotyk, PA-C [phone] Tita Form) 10/26/2023, 2:08 PM  [facility] [address] [place] [address] [phone] Deanna [phone] (F)    After 5pm and on the weekends please log on to  Amion, go to orthopaedics and the look under the [facility] Call for the provider(s) on call. You can also call our office at [phone] and then follow the prompts to be connected to the call team.  Patient ID: Justin Kingdom [name]., male   DOB: Oct 13, 1940, 83 y.o.   MRN: [id number]

## 2023-10-26 NOTE — Plan of Care (Signed)

## 2023-10-26 NOTE — Discharge Instructions (Signed)
 [facility] Discharge Instructions   General Discharge Instructions   WEIGHT BEARING STATUS: Weightbearing as tolerated left upper extremity  RANGE OF MOTION/ACTIVITY: Unrestricted range of motion of left upper extremity, slowly increase activity  Bone health:   Review the following resource for additional information regarding bone health  [email or web address]  Wound Care: Dressing changes every other day starting on 10/28/2023.  Please see below   Discharge Wound Care Instructions  Do NOT apply any ointments, solutions or lotions to pin sites or surgical wounds.  These prevent needed drainage and even though solutions like hydrogen peroxide kill bacteria, they also damage cells lining the pin sites that help fight infection.  Applying lotions or ointments can keep the wounds moist and can cause them to breakdown and open up as well. This can increase the risk for infection. When in doubt call the office.  Surgical incisions should be dressed daily.  If any drainage is noted, use one layer of adaptic or Mepitel, then gauze, Kerlix, and an ace wrap.  [email or web address]  [email or web address]  These dressing supplies should be available at local medical supply stores ([facility], [facility], etc). They are not usually carried at places like [facility], [facility], [facility], etc  Once the incision is completely dry and without drainage, it may be left open to air out.  Showering may begin 36-48 hours later.  Cleaning gently with soap and water .  Traumatic wounds should be dressed daily as well.    One layer of adaptic, gauze, Kerlix, then ace wrap.  The adaptic can be discontinued once the draining has  ceased    If you have a wet to dry dressing: wet the gauze with saline the squeeze as much saline out so the gauze is moist (not soaking wet), place moistened gauze over wound, then place a dry gauze over the moist one, followed by Kerlix wrap, then ace wrap.    Diet: as you were eating previously.  Can use over the counter stool softeners and bowel preparations, such as Miralax , to help with bowel movements.  Narcotics can be constipating.  Be sure to drink plenty of fluids  PAIN MEDICATION USE AND EXPECTATIONS  You have likely been given narcotic medications to help control your pain.  After a traumatic event that results in an fracture (broken bone) with or without surgery, it is ok to use narcotic pain medications to help control one's pain.  We understand that everyone responds to pain differently and each individual patient will be evaluated on a regular basis for the continued need for narcotic medications. Ideally, narcotic medication use should last no more than 6-8 weeks (coinciding with fracture healing).   As a patient it is your responsibility as well to monitor narcotic medication use and report the amount and frequency you use these medications when you come to your office visit.   We would also advise that if you are using narcotic medications, you should take a dose prior to therapy to maximize you participation.  IF YOU ARE ON NARCOTIC MEDICATIONS IT IS NOT PERMISSIBLE TO OPERATE A MOTOR VEHICLE (MOTORCYCLE/CAR/TRUCK/MOPED) OR HEAVY MACHINERY DO NOT MIX NARCOTICS WITH OTHER CNS (CENTRAL NERVOUS SYSTEM) DEPRESSANTS SUCH AS ALCOHOL    POST-OPERATIVE OPIOID TAPER INSTRUCTIONS: It is important to wean off of your opioid medication as soon as possible. If you do not need pain medication after your surgery it is ok to stop day one. Opioids include: Codeine, Hydrocodone (Norco, Vicodin), Oxycodone (Percocet, oxycontin ) and hydromorphone  amongst others.  Long  term and even short term use  of opiods can cause: Increased pain response Dependence Constipation Depression Respiratory depression And more.  Withdrawal symptoms can include Flu like symptoms Nausea, vomiting And more Techniques to manage these symptoms Hydrate well Eat regular healthy meals Stay active Use relaxation techniques(deep breathing, meditating, yoga) Do Not substitute Alcohol  to help with tapering If you have been on opioids for less than two weeks and do not have pain than it is ok to stop all together.  Plan to wean off of opioids This plan should start within one week post op of your fracture surgery  Maintain the same interval or time between taking each dose and first decrease the dose.  Cut the total daily intake of opioids by one tablet each day Next start to increase the time between doses. The last dose that should be eliminated is the evening dose.    STOP SMOKING OR USING NICOTINE PRODUCTS!!!!  As discussed nicotine severely impairs your body's ability to heal surgical and traumatic wounds but also impairs bone healing.  Wounds and bone heal by forming microscopic blood vessels (angiogenesis) and nicotine is a vasoconstrictor (essentially, shrinks blood vessels).  Therefore, if vasoconstriction occurs to these microscopic blood vessels they essentially disappear and are unable to deliver necessary nutrients to the healing tissue.  This is one modifiable factor that you can do to dramatically increase your chances of healing your injury.    (This means no smoking, no nicotine gum, patches, etc)  DO NOT USE NONSTEROIDAL ANTI-INFLAMMATORY DRUGS (NSAID'S)  Using products such as Advil (ibuprofen), Aleve (naproxen), Motrin (ibuprofen) for additional pain control during fracture healing can delay and/or prevent the healing response.  If you would like to take over the counter (OTC) medication, Tylenol  (acetaminophen ) is ok.  However, some narcotic medications that are given for pain control  contain acetaminophen  as well. Therefore, you should not exceed more than 4000 mg of tylenol  in a day if you do not have liver disease.  Also note that there are may OTC medicines, such as cold medicines and allergy medicines that my contain tylenol  as well.  If you have any questions about medications and/or interactions please ask your doctor/PA or your pharmacist.      ICE AND ELEVATE INJURED/OPERATIVE EXTREMITY  Using ice and elevating the injured extremity above your heart can help with swelling and pain control.  Icing in a pulsatile fashion, such as 20 minutes on and 20 minutes off, can be followed.    Do not place ice directly on skin. Make sure there is a barrier between to skin and the ice pack.    Using frozen items such as frozen peas works well as the conform nicely to the are that needs to be iced.  USE AN ACE WRAP OR TED HOSE FOR SWELLING CONTROL  In addition to icing and elevation, Ace wraps or TED hose are used to help limit and resolve swelling.  It is recommended to use Ace wraps or TED hose until you are informed to stop.    When using Ace Wraps start the wrapping distally (farthest away from the body) and wrap proximally (closer to the body)   Example: If you had surgery on your leg and you do not have a splint on, start the ace wrap at the toes and work your way up to the thigh        If you had surgery on your upper extremity and do not have a splint on,  start the ace wrap at your fingers and work your way up to the upper arm  IF YOU ARE IN A SPLINT OR CAST DO NOT REMOVE IT FOR ANY REASON   If your splint gets wet for any reason please contact the office immediately. You may shower in your splint or cast as long as you keep it dry.  This can be done by wrapping in a cast cover or garbage back (or similar)  Do Not stick any thing down your splint or cast such as pencils, money, or hangers to try and scratch yourself with.  If you feel itchy take benadryl  as prescribed on the  bottle for itching  IF YOU ARE IN A CAM BOOT (BLACK BOOT)  You may remove boot periodically. Perform daily dressing changes as noted below.  Wash the liner of the boot regularly and wear a sock when wearing the boot. It is recommended that you sleep in the boot until told otherwise    Call office for the following: Temperature greater than 101F Persistent nausea and vomiting Severe uncontrolled pain Redness, tenderness, or signs of infection (pain, swelling, redness, odor or green/yellow discharge around the site) Difficulty breathing, headache or visual disturbances Hives Persistent dizziness or light-headedness Extreme fatigue Any other questions or concerns you may have after discharge  In an emergency, call 911 or go to an Emergency Department at a nearby hospital  HELPFUL INFORMATION  If you had a block, it will wear off between 8-24 hrs postop typically.  This is period when your pain may go from nearly zero to the pain you would have had postop without the block.  This is an abrupt transition but nothing dangerous is happening.  You may take an extra dose of narcotic when this happens.  You should wean off your narcotic medicines as soon as you are able.  Most patients will be off or using minimal narcotics before their first postop appointment.   We suggest you use the pain medication the first night prior to going to bed, in order to ease any pain when the anesthesia wears off. You should avoid taking pain medications on an empty stomach as it will make you nauseous.  Do not drink alcoholic beverages or take illicit drugs when taking pain medications.  In most states it is against the law to drive while you are in a splint or sling.  And certainly against the law to drive while taking narcotics.  You may return to work/school in the next couple of days when you feel up to it.   Pain medication may make you constipated.  Below are a few solutions to try in this  order: Decrease the amount of pain medication if you aren't having pain. Drink lots of decaffeinated fluids. Drink prune juice and/or each dried prunes  If the first 3 don't work start with additional solutions Take Colace - an over-the-counter stool softener Take Senokot - an over-the-counter laxative Take Miralax  - a stronger over-the-counter laxative     CALL THE OFFICE WITH ANY QUESTIONS OR CONCERNS: [phone]   VISIT OUR WEBSITE FOR ADDITIONAL INFORMATION: [email or web address]

## 2023-10-26 NOTE — Telephone Encounter (Signed)
 Pharmacy Patient Advocate Encounter   Received notification from Fax that prior authorization for Ondansetron  HCl 4MG  tablets is required/requested.   Insurance verification completed.   The patient is insured through [facility] ADVANTAGE/RX ADVANCE .   Per test claim: PA required; PA submitted to above mentioned insurance via CoverMyMeds Key/confirmation #/EOC [id number] Status is pending

## 2023-10-26 NOTE — Progress Notes (Signed)
 PROGRESS NOTE    Justin [name] [place].  ZOX:[id number] DOB: 1940/09/18 DOA: 10/21/2023 PCP: Genia Kettering, MD  Subjective: Pt seen and examined.  HgB down to 6.9. 2 units PRBC ordered. Pt requested that his oncologist Dr. Rosaline Coma be notified that pt was getting transfusion. I have notified Dr. Rosaline Coma. Dr. Rosaline Coma has acknowledged.  Home health ABX have been set up. PICC line placed.   Hospital Course: HPI: Justin [name]. is a 83 y.o. male with medical history significant of HTN, CAD, BPH, hx of TIAs, DDD/chronic low back pain, MDS, thrombocytopenia, depression who presented to ED with complaints of acute on chronic left elbow pain. He started to have pain out of the ordinary last night around 8pm. He denies any trauma. This morning he called his daughter and she went over to his house. He was to weak and in too much pain to even get into the car so she called EMS. He can not move his left elbow at all. Pain 10/10. Pain radiates down his entire hand. He denies any fever, chilled at times. NO N/V. It is warm to touch, not really sure it's changed in color.    He has history of an open fracture in his left elbow in 2007 and had surgery. About 4 years ago one of the pins came lose and this was taken out. At baseline now he can not fully extend or flex to 90 degrees but has some movement in it. He can't used his hands at all. He states his hands are like a claw. He is right handed.      He has been feeling good. Denies any fever/chills, vision changes/headaches, chest pain or palpitations, shortness of breath or cough, abdominal pain, N/V/D, dysuria or leg swelling.      Denies any fever, vision changes/headaches, chest pain or palpitations, shortness of breath or cough, abdominal pain, N/V/D, dysuria or leg swelling.    He does not smoke or drink alcohol .    ER Course:  vitals: afebrile, bp: 133/69, HR; 78, RR: 16, oxygen: 94% Pertinent labs: WBC: 13, hgb: 9.0, platelets 104,  potassium: 3.1,  Left elbow xray: Remote distal humeral and proximal olecranon plate and screw fracture fixation. No evidence of hardware failure. 2. 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 3. There is a linear 22 x less than 1 mm metallic possible needle within the soft tissues medial to the proximal diaphysis of the ulna, appearing within approximately 1.2 cm of the medial proximal forearm skin surface. Recommend clinical correlation. In ED: given potassium, cefepime  and vanc and BC obtained. Lactic acid wnl. Fluid obtained from elbow. TRH asked to admit.   Significant Events: Admitted 10/21/2023 acute on chronic left elbow pain   Significant Labs: WBC 13.0, HgB 9.0, plt 104 ESR 12 CRP 9.3 Blood cx 4-20 Staph Aureus. MSSA  Significant Imaging Studies: Left elbow XR Remote distal humeral and proximal olecranon plate and screw fracture fixation. No evidence of hardware failure. 2. 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 3. There is a linear 22 x less than 1 mm metallic possible needle within the soft tissues medial to the proximal diaphysis of the ulna, appearing within approximately 1.2 cm of the medial proximal forearm skin surface. Recommend clinical correlation.   Antibiotic Therapy: Anti-infectives (From admission, onward)    Start     Dose/Rate Route Frequency Ordered Stop   10/22/23 1700  vancomycin  (VANCOREADY) IVPB 1250 mg/250 mL  Status:  Discontinued  1,250 mg 166.7 mL/hr over 90 Minutes Intravenous Every 24 hours 10/21/23 1914 10/22/23 0619   10/22/23 0715  ceFAZolin  (ANCEF ) IVPB 2g/100 mL premix        2 g 200 mL/hr over 30 Minutes Intravenous Every 8 hours 10/22/23 0619     10/21/23 2000  cefTRIAXone  (ROCEPHIN ) 2 g in sodium chloride  0.9 % 100 mL IVPB  Status:  Discontinued        2 g 200 mL/hr over 30 Minutes Intravenous Every 24 hours 10/21/23 1907 10/22/23 0619   10/21/23 1530  vancomycin  (VANCOREADY)  IVPB 1500 mg/300 mL        1,500 mg 150 mL/hr over 120 Minutes Intravenous  Once 10/21/23 1519 10/21/23 1818   10/21/23 1530  ceFEPIme  (MAXIPIME ) 2 g in sodium chloride  0.9 % 100 mL IVPB        2 g 200 mL/hr over 30 Minutes Intravenous  Once 10/21/23 1519 10/21/23 1649       Procedures: 10-24-2023 I&D left elbow 10-25-2023 TEE 10-25-2023 PICC line placement  Consultants: Orthopedics ID    Assessment and Plan: * Acute on chronic pain of left elbow concerning for septic arthritis 10-21-2023 through 10-24-2023. 83 year old presenting to ED with acute onset of pain in his left elbow, decreased mobility, warm to touch and labs concerning for possibly infected joint. He has history of an open fracture in his left elbow in 2007 and had surgery with hardware. About 4 years ago one of the pins came lose and this was taken out.  -admit to [facility] -ortho consulted (Dr. Bernard Brick)  -joint aspiration obtained by EDP and described as purulent. Gram stain showing gram positive cocci -continue vancomycin . Change cefepime  to rocephin   -check inflammatory markers -check uric acid  -no hx of gout or pseudo gout, but follow synovial fluid for crystals  -trend PCT  -CT/other imaging per ortho  -pain control difficult. Continue home oxycodone  five times/day. Morphine  q 2 hours for severe pain, may need dilaudid , but transferring to [facility].   10-24-2023 continue with IV Ancef  for MSSA septicemia. TEE negative for vegetations. Going to MRI left hip and lumbar spine today.  S/p left elbow hardware removal on 10-23-2023  10-25-2023 awaiting [name] and PICC orders from ID. Family plans on taking pt home. Discussed with pt's dtr Justin [name]  10-26-2023 [name] orders verified. PICC placed. Ready for DC today after PRBC transfusion.  MSSA bacteremia 10-24-2023 ID following. On IV Ancef . [name] to be determined by ID.  10-25-2023 awaiting [name] and PICC orders from ID. Family plans on taking pt home. Discussed with pt's dtr  Justin [name].  10-26-2023 Regimen: Cefazolin  2g IV every 8 hours. End date: 12/18/23 (8 weeks from OR 4/22)  Abscess in epidural space of lumbar spine 10-25-2023 lumbar epidural abscess L4-S1 seen on MRI lumbar spine. ID requested Neurosurgery consult. Discussed with Dr. Larrie Po with NS. He does not think pt will need operative intervention at this time but will see patient in consultation today. Dtr Justin [name] aware of epidural abscess.  10-26-2023 Regimen: Cefazolin  2g IV every 8 hours. End date: 12/18/23 (8 weeks from OR 4/22). F/u with Dr. Larrie Po with neurosurgery.  Hypokalemia 10-23-2023 through 10-23-2023. Check magnesium . Repleted in ED. Trend   10-24-2023 resolved. K 4.1 today.  MDS (myelodysplastic syndrome) (HCC) 10-21-2023 through 10-22-2021. -followed by Dr. Rosaline Coma. Diagnosed 12/2021. Myelodysplastic Syndrome, Single Lineage Dysplasia -12/19/2021: Bone marrow biopsy which showed a low-grade myelodysplastic syndrome.  - Started retacrit  20,000 units q 2 weeks  10-24-2023 stable.  10-25-2023 stable.  10-26-2023 2 units PRBC ordered for transfusion due to HgB of 6.9. PRBC will be given prior to DC. Give lasix  20 mg IV after transfusion.  GERD (gastroesophageal reflux disease) 10-24-2023 On TUMs PRN   10-25-2023 stable.  10-26-2023 stable.  Coronary artery disease 10-21-2023 through 10-23-2023 Lexiscan  stress test 2/19 was overall low risk with no ischemia identified.   CT cardiac scoring 07/2018 showed coronary calcium  score of 41 (15th percentile). Continue statin/ASA   10-24-2023 stable.  10-25-2023 stable.  10-26-2023 stable.  Essential hypertension 10-24-2023 Continue norvasc , hytrin   10-25-2023 stable.  10-26-2023 stable.  BPH (benign prostatic hyperplasia) 10-24-2023 Continue flomax  and proscar    10-25-2023 stable.  10-26-2023 stable.  Low back pain 10-21-2023 through 10-24-2023 Continue home oxycodone  10mg  5x/day. Continue steroid taper (2 more days). PMP website  verified.   10-24-2023 stable. On oxycodone .  10-25-2023 due to epidural abscess. Continue oxycodone .  10-26-2023 stable.  RLS (restless legs syndrome) 10-24-2023 Continue requip , gabapentin  and sinemet    10-25-2023 stable.  10-26-2023 stable.  DVT prophylaxis: SCDs Start: 10/21/23 1645    Code Status: Full Code Family Communication: no family at bedside Disposition Plan: return home Reason for continuing need for hospitalization: stable for DC today after PRBC transfusion.  Objective: Vitals:   10/25/23 0818 10/25/23 2021 10/26/23 0513 10/26/23 0737  BP: (!) 155/59 (!) 175/65 (!) 172/67 (!) 150/69  Pulse: 60 77 73 70  Resp: 18 18 18    Temp: 97.7 F (36.5 C) 98.8 F (37.1 C) 98.8 F (37.1 C) 98.8 F (37.1 C)  TempSrc:  Oral Oral Oral  SpO2: 98% 94% 98% 96%  Weight:      Height:        Intake/Output Summary (Last 24 hours) at 10/26/2023 1042 Last data filed at 10/25/2023 1211 Gross per 24 hour  Intake 236 ml  Output --  Net 236 ml   Filed Weights   10/21/23 1906  Weight: 76.3 kg    Examination:  Physical Exam Vitals and nursing note reviewed.  Constitutional:      General: He is not in acute distress.    Appearance: He is not toxic-appearing or diaphoretic.  HENT:     Head: Normocephalic and atraumatic.     Nose: Nose normal.  Cardiovascular:     Rate and Rhythm: Normal rate and regular rhythm.  Pulmonary:     Effort: Pulmonary effort is normal.     Breath sounds: Normal breath sounds.  Abdominal:     General: Bowel sounds are normal. There is no distension.     Palpations: Abdomen is soft.  Musculoskeletal:     Comments: Left UE in sling. Left hand/forearm swelling.  Skin:    Capillary Refill: Capillary refill takes less than 2 seconds.     Comments: Right UE PICC line  Neurological:     Mental Status: He is alert and oriented to person, place, and time.     Data Reviewed: I have personally reviewed following labs and imaging  studies  CBC: Recent Labs  Lab 10/21/23 1127 10/22/23 0654 10/23/23 0452 10/24/23 0652 10/25/23 0656 10/26/23 0330  WBC 13.0* 21.4* 15.3* 9.5 11.7* 9.1  NEUTROABS 6.5  --   --   --   --   --   HGB 9.0* 9.1* 8.7* 7.8* 7.1* 6.9*  HCT 29.9* 29.6* 28.4* 25.2* 23.6* 23.1*  MCV 79.1* 78.1* 76.8* 76.6* 75.9* 78.0*  PLT 104* 90* 80* 69* 106* 121*   Basic Metabolic Panel: Recent Labs  Lab 10/21/23 1329 10/21/23 1612 10/22/23 1005 10/23/23 0452 10/24/23 0652  NA 137  --  134* 132* 138  K 3.1*  --  3.3* 3.6 4.1  CL 106  --  104 101 106  CO2 23  --  21* 21* 22  GLUCOSE 85  --  175* 112* 149*  BUN 24*  --  19 26* 37*  CREATININE 1.01  --  1.15 1.30* 1.21  CALCIUM  8.6*  --  8.3* 8.2* 8.3*  MG  --  1.7  --  2.5*  --   PHOS  --   --   --  2.3* 3.9   GFR: Estimated Creatinine Clearance: 45.4 mL/min (by C-G formula based on SCr of 1.21 mg/dL). Liver Function Tests: Recent Labs  Lab 10/21/23 1329  AST 16  ALT 11  ALKPHOS 60  BILITOT 1.0  PROT 6.0*  ALBUMIN  3.0*   CBG: Recent Labs  Lab 10/21/23 1008 10/21/23 1348  GLUCAP 92 78   Sepsis Labs: Recent Labs  Lab 10/21/23 1139 10/21/23 1612 10/22/23 1005  PROCALCITON  --  1.21 6.28  LATICACIDVEN 0.7  --   --     Recent Results (from the past 240 hours)  Blood Cultures x 2 sites     Status: Abnormal   Collection Time: 10/21/23 11:27 AM   Specimen: Site Not Specified; Blood  Result Value Ref Range Status   Specimen Description   Final    SITE NOT SPECIFIED Performed at [facility], [address] [place], [place] [address]    Special Requests   Final    BOTTLES DRAWN AEROBIC AND ANAEROBIC Blood Culture adequate volume Performed at [facility], [address] [place], [place] [address]    Culture  Setup Time   Final    GRAM POSITIVE COCCI IN CLUSTERS IN BOTH AEROBIC AND ANAEROBIC BOTTLES CRITICAL RESULT CALLED TO, READ BACK BY AND VERIFIED WITH: PHARMD G ABBOTT  10/22/2023 @ 0610 BY AB Performed at [facility], [address] [place], [place] [address]    Culture STAPHYLOCOCCUS AUREUS (A)  Final   Report Status 10/24/2023 FINAL  Final   Organism ID, Bacteria STAPHYLOCOCCUS AUREUS  Final      Susceptibility   Staphylococcus aureus - MIC*    CIPROFLOXACIN >=8 RESISTANT Resistant     ERYTHROMYCIN >=8 RESISTANT Resistant     GENTAMICIN <=0.5 SENSITIVE Sensitive     OXACILLIN 0.5 SENSITIVE Sensitive     TETRACYCLINE <=1 SENSITIVE Sensitive     VANCOMYCIN  1 SENSITIVE Sensitive     TRIMETH /SULFA  >=320 RESISTANT Resistant     CLINDAMYCIN  <=0.25 SENSITIVE Sensitive     RIFAMPIN <=0.5 SENSITIVE Sensitive     Inducible Clindamycin  NEGATIVE Sensitive     LINEZOLID 2 SENSITIVE Sensitive     * STAPHYLOCOCCUS AUREUS  Blood Culture ID Panel (Reflexed)     Status: Abnormal   Collection Time: 10/21/23 11:27 AM  Result Value Ref Range Status   Enterococcus faecalis NOT DETECTED NOT DETECTED Final   Enterococcus Faecium NOT DETECTED NOT DETECTED Final   Listeria monocytogenes NOT DETECTED NOT DETECTED Final   Staphylococcus species DETECTED (A) NOT DETECTED Final    Comment: CRITICAL RESULT CALLED TO, READ BACK BY AND VERIFIED WITH: PHARMD G ABBOTT 10/22/2023 @ 0610 BY AB    Staphylococcus aureus (BCID) DETECTED (A) NOT DETECTED Final    Comment: CRITICAL RESULT CALLED TO, READ BACK BY AND VERIFIED WITH: PHARMD G ABBOTT 10/22/2023 @  0610 BY AB    Staphylococcus epidermidis NOT DETECTED NOT DETECTED Final   Staphylococcus lugdunensis NOT DETECTED NOT DETECTED Final   Streptococcus species NOT DETECTED NOT DETECTED Final   Streptococcus agalactiae NOT DETECTED NOT DETECTED Final   Streptococcus pneumoniae NOT DETECTED NOT DETECTED Final   Streptococcus pyogenes NOT DETECTED NOT DETECTED Final   A.calcoaceticus-baumannii NOT DETECTED NOT DETECTED Final   Bacteroides fragilis NOT DETECTED NOT DETECTED Final   Enterobacterales NOT DETECTED NOT DETECTED  Final   Enterobacter cloacae complex NOT DETECTED NOT DETECTED Final   Escherichia coli NOT DETECTED NOT DETECTED Final   Klebsiella aerogenes NOT DETECTED NOT DETECTED Final   Klebsiella oxytoca NOT DETECTED NOT DETECTED Final   Klebsiella pneumoniae NOT DETECTED NOT DETECTED Final   Proteus species NOT DETECTED NOT DETECTED Final   Salmonella species NOT DETECTED NOT DETECTED Final   Serratia marcescens NOT DETECTED NOT DETECTED Final   Haemophilus influenzae NOT DETECTED NOT DETECTED Final   Neisseria meningitidis NOT DETECTED NOT DETECTED Final   Pseudomonas aeruginosa NOT DETECTED NOT DETECTED Final   Stenotrophomonas maltophilia NOT DETECTED NOT DETECTED Final   Candida albicans NOT DETECTED NOT DETECTED Final   Candida auris NOT DETECTED NOT DETECTED Final   Candida glabrata NOT DETECTED NOT DETECTED Final   Candida krusei NOT DETECTED NOT DETECTED Final   Candida parapsilosis NOT DETECTED NOT DETECTED Final   Candida tropicalis NOT DETECTED NOT DETECTED Final   Cryptococcus neoformans/gattii NOT DETECTED NOT DETECTED Final   Meth resistant mecA/C and MREJ NOT DETECTED NOT DETECTED Final    Comment: Performed at [facility], [address], [place], [place] [address]  Blood Cultures x 2 sites     Status: Abnormal   Collection Time: 10/21/23 12:19 PM   Specimen: BLOOD  Result Value Ref Range Status   Specimen Description   Final    BLOOD BLOOD RIGHT ARM Performed at [facility], [address] [place], [place] [address]    Special Requests   Final    BOTTLES DRAWN AEROBIC AND ANAEROBIC Blood Culture adequate volume Performed at [facility], [address] [place], [place] [address]    Culture  Setup Time   Final    GRAM POSITIVE COCCI IN CLUSTERS ANAEROBIC BOTTLE ONLY CRITICAL VALUE NOTED.  VALUE IS CONSISTENT WITH PREVIOUSLY REPORTED AND CALLED VALUE.    Culture (A)  Final    STAPHYLOCOCCUS AUREUS SUSCEPTIBILITIES  PERFORMED ON PREVIOUS CULTURE WITHIN THE LAST 5 DAYS. Performed at [facility], [address], [place], [place] [address]    Report Status 10/24/2023 FINAL  Final  Body fluid culture w Gram Stain     Status: None   Collection Time: 10/21/23  1:59 PM   Specimen: Synovium; Synovial Fluid  Result Value Ref Range Status   Specimen Description   Final    SYNOVIAL Performed at [facility], [address] [place], [place] [address]    Special Requests   Final    NONE LEFT ELBOW Performed at [facility], [address] [place], [place] [address]    Gram Stain   Final    ABUNDANT WBC PRESENT,BOTH PMN AND MONONUCLEAR ABUNDANT GRAM POSITIVE COCCI Performed at [facility], [address] [place], [place] [address]    Culture ABUNDANT STAPHYLOCOCCUS AUREUS  Final   Report Status 10/23/2023 FINAL  Final   Organism ID, Bacteria STAPHYLOCOCCUS AUREUS  Final  Susceptibility   Staphylococcus aureus - MIC*    CIPROFLOXACIN >=8 RESISTANT Resistant     ERYTHROMYCIN >=8 RESISTANT Resistant     GENTAMICIN <=0.5 SENSITIVE Sensitive     OXACILLIN 0.5 SENSITIVE Sensitive     TETRACYCLINE <=1 SENSITIVE Sensitive     VANCOMYCIN  <=0.5 SENSITIVE Sensitive     TRIMETH /SULFA  >=320 RESISTANT Resistant     CLINDAMYCIN  <=0.25 SENSITIVE Sensitive     RIFAMPIN <=0.5 SENSITIVE Sensitive     Inducible Clindamycin  NEGATIVE Sensitive     LINEZOLID 2 SENSITIVE Sensitive     * ABUNDANT STAPHYLOCOCCUS AUREUS  Gram stain     Status: None   Collection Time: 10/21/23  1:59 PM   Specimen: Synovium; Body Fluid  Result Value Ref Range Status   Specimen Description SYNOVIAL  Final   Special Requests LEFT ELBOW  Final   Gram Stain   Final    ABUNDANT WBC SEEN GRAM POSITIVE COCCI Gram Stain Report Called to,Read Back By and Verified With: Alonna Art RN AT 1529 ON 10/21/2023 BY Anibal [place] Performed at [facility], [address] [place],  [place] [address]    Report Status 10/21/2023 FINAL  Final  Aerobic/Anaerobic Culture w Gram Stain (surgical/deep wound)     Status: None (Preliminary result)   Collection Time: 10/23/23  2:37 PM   Specimen: Synovial, Left Elbow; Body Fluid  Result Value Ref Range Status   Specimen Description SYNOVIAL  Final   Special Requests SWAB OF LEFT ELBOW SYNOVIAL FLUID  Final   Gram Stain   Final    ABUNDANT WBC PRESENT, PREDOMINANTLY PMN RARE GRAM POSITIVE COCCI IN PAIRS Gram Stain Report Called to,Read Back By and Verified With: RN B. HICKLIN  [phone]@ 2036 FH Performed at [facility], [address], [place], [place] [address]    Culture   Final    MODERATE STAPHYLOCOCCUS AUREUS NO ANAEROBES ISOLATED; CULTURE IN PROGRESS FOR 5 DAYS    Report Status PENDING  Incomplete   Organism ID, Bacteria STAPHYLOCOCCUS AUREUS  Final      Susceptibility   Staphylococcus aureus - MIC*    CIPROFLOXACIN >=8 RESISTANT Resistant     ERYTHROMYCIN >=8 RESISTANT Resistant     GENTAMICIN <=0.5 SENSITIVE Sensitive     OXACILLIN 0.5 SENSITIVE Sensitive     TETRACYCLINE <=1 SENSITIVE Sensitive     VANCOMYCIN  1 SENSITIVE Sensitive     TRIMETH /SULFA  >=320 RESISTANT Resistant     CLINDAMYCIN  <=0.25 SENSITIVE Sensitive     RIFAMPIN <=0.5 SENSITIVE Sensitive     Inducible Clindamycin  NEGATIVE Sensitive     LINEZOLID 2 SENSITIVE Sensitive     * MODERATE STAPHYLOCOCCUS AUREUS  Aerobic/Anaerobic Culture w Gram Stain (surgical/deep wound)     Status: None (Preliminary result)   Collection Time: 10/23/23  3:20 PM   Specimen: Soft Tissue, Other  Result Value Ref Range Status   Specimen Description TISSUE  Final   Special Requests OLECRANON BURSA  Final   Gram Stain   Final    NO WBC SEEN RARE GRAM POSITIVE COCCI IN PAIRS Gram Stain Report Called to,Read Back By and Verified With: RN Janey Meek [phone] @ [phone] Performed at [facility], [address], [place], [place] [address]    Culture   Final    RARE  STAPHYLOCOCCUS AUREUS RARE STAPHYLOCOCCUS EPIDERMIDIS NO ANAEROBES ISOLATED; CULTURE IN PROGRESS FOR 5 DAYS    Report Status PENDING  Incomplete   Organism ID, Bacteria STAPHYLOCOCCUS AUREUS  Final   Organism ID, Bacteria STAPHYLOCOCCUS EPIDERMIDIS  Final      Susceptibility   Staphylococcus aureus - MIC*    CIPROFLOXACIN >=8 RESISTANT Resistant     ERYTHROMYCIN >=8 RESISTANT Resistant     GENTAMICIN <=0.5 SENSITIVE Sensitive     OXACILLIN 0.5 SENSITIVE Sensitive     TETRACYCLINE <=1 SENSITIVE Sensitive     VANCOMYCIN  <=0.5 SENSITIVE Sensitive     TRIMETH /SULFA  >=320 RESISTANT Resistant     CLINDAMYCIN  <=0.25 SENSITIVE Sensitive     RIFAMPIN <=0.5 SENSITIVE Sensitive     Inducible Clindamycin  NEGATIVE Sensitive     LINEZOLID 2 SENSITIVE Sensitive     * RARE STAPHYLOCOCCUS AUREUS   Staphylococcus epidermidis - MIC*    CIPROFLOXACIN <=0.5 SENSITIVE Sensitive     ERYTHROMYCIN <=0.25 SENSITIVE Sensitive     GENTAMICIN <=0.5 SENSITIVE Sensitive     OXACILLIN <=0.25 SENSITIVE Sensitive     TETRACYCLINE 2 SENSITIVE Sensitive     VANCOMYCIN  2 SENSITIVE Sensitive     TRIMETH /SULFA  <=10 SENSITIVE Sensitive     CLINDAMYCIN  <=0.25 SENSITIVE Sensitive     RIFAMPIN <=0.5 SENSITIVE Sensitive     Inducible Clindamycin  NEGATIVE Sensitive     * RARE STAPHYLOCOCCUS EPIDERMIDIS  Culture, blood (Routine X 2) w Reflex to ID Panel     Status: None (Preliminary result)   Collection Time: 10/23/23 10:13 PM   Specimen: BLOOD  Result Value Ref Range Status   Specimen Description BLOOD SITE NOT SPECIFIED  Final   Special Requests   Final    BOTTLES DRAWN AEROBIC AND ANAEROBIC Blood Culture adequate volume   Culture   Final    NO GROWTH 3 DAYS Performed at [facility], [address], [place], [place] [address]    Report Status PENDING  Incomplete  Culture, blood (Routine X 2) w Reflex to ID Panel     Status: None (Preliminary result)   Collection Time: 10/23/23 10:15 PM   Specimen: BLOOD   Result Value Ref Range Status   Specimen Description BLOOD SITE NOT SPECIFIED  Final   Special Requests   Final    BOTTLES DRAWN AEROBIC AND ANAEROBIC Blood Culture adequate volume   Culture   Final    NO GROWTH 3 DAYS Performed at [facility], [address], [place], [place] [address]    Report Status PENDING  Incomplete     Radiology Studies: MR Lumbar Spine W Wo Contrast Result Date: 10/24/2023 CLINICAL DATA:  Back pain. EXAM: MRI LUMBAR SPINE WITHOUT AND WITH CONTRAST TECHNIQUE: Multiplanar and multiecho pulse sequences of the lumbar spine were obtained without and with intravenous contrast. CONTRAST:  8mL GADAVIST  GADOBUTROL  1 MMOL/ML IV SOLN COMPARISON:  None Available. FINDINGS: Segmentation:  Standard. Alignment: Dextroscoliosis with apex at L4. Grade 1 retrolisthesis at L3-4 and L4-5 and grade 1 anterolisthesis at L5-S1. Vertebrae: There is opposing endplate edema at Q6-5 with a small amount of fluid within the posterior disc space. There is also endplate edema at H84-69, L4-5 and L5-S1. There is L5-S1 facet edema. No acute fracture. Conus medullaris and cauda equina: Conus extends to the T12 level. Conus and cauda equina appear normal. Paraspinal and other soft tissues: There is a left eccentric and dorsal heterogeneous and peripherally contrast-enhancing collection that extends from L4-S1. Small fluid collections within the left dorsal paraspinous muscles. There is edema within the left psoas muscles with an area of probable phlegmonous change. Disc levels: T12-L1: Unremarkable. L1-L2: Small disc bulge with endplate spurring.  No spinal canal stenosis. Severe right neural foraminal stenosis. L2-L3: Small disc bulge with bilateral endplate spurring. No spinal canal stenosis. Mild right neural foraminal stenosis. L3-L4: Left asymmetric disc bulge with endplate spurring. Left lateral recess narrowing without central spinal canal stenosis. Mild right and moderate left neural foraminal  stenosis. L4-L5: Small ventral component of the above described fluid collection. Left lateral recess narrowing without central spinal canal stenosis. Mass effect on the left L5 nerve root in the lateral recess. Severe left neural foraminal stenosis. L5-S1: Moderate facet arthrosis with left facet edema. As above, heterogeneous collection along the dorsal and left side of the epidural space. There is narrowing of the left lateral recess with mass effect on the left S1 nerve root. No central spinal canal stenosis. Moderate right and severe left neural foraminal stenosis. IMPRESSION: 1. Findings concerning for septic arthritis at the left L5-S1 facet. 2. 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 3. Edema within the left psoas muscles with an area of probable phlegmonous change. Small fluid collections in the left dorsal paraspinous muscles, consistent with myositis. 4. Severe right L1-2, left L4-5 and left L5-S1 neural foraminal stenosis. 5. Left lateral recess narrowing at L4-5 and L5-S1 with mass effect on the left L5 and S1 nerve roots. Electronically Signed   By: Juanetta Nordmann M.D.   On: 10/24/2023 22:06   MR HIP LEFT W WO CONTRAST Result Date: 10/24/2023 CLINICAL DATA:  Concern for septic arthritis. Known MSSA bacteremia. EXAM: MRI OF THE LEFT HIP WITHOUT AND WITH CONTRAST TECHNIQUE: Multiplanar, multisequence MR imaging was performed both before and after administration of intravenous contrast. CONTRAST:  8mL GADAVIST  GADOBUTROL  1 MMOL/ML IV SOLN COMPARISON:  None Available. FINDINGS: Soft tissue and Muscle: There is diffuse subcutaneous edema extending along the visualized lower abdominal wall and into the bilateral proximal hips. There is relatively symmetric bilateral diffuse intramuscular and interstitial edema of the visualized proximal thighs, most notably involving the bilateral gluteus medius, adductor and anterior compartment musculature. No significant associated enhancement. No discrete  loculated intramuscular collection identified.Hamstring tendon origins are intact.Gluteal cuff tendon insertions are intact.Trace pelvic free fluid. Bones/Hip: No acute osseous abnormality. Femoral heads are seated within the acetabula. No hip joint effusion. No periarticular edema or enhancement. Diffuse red marrow reconversion. IMPRESSION: 1. No evidence of septic arthritis of the left hip. No acute osseous abnormality. 2. Diffuse subcutaneous edema of the visualized lower abdominal wall extending through the bilateral proximal hips with symmetric bilateral relatively diffuse intramuscular and interstitial edema of the proximal thighs. No evidence of enhancement. No fluid collection. These findings are nonspecific and could reflect systemic inflammatory response in the setting of known MSSA bacteremia, non-suppurative myositis, or volume overload. 3. Trace nonspecific pelvic free fluid. Electronically Signed   By: Mannie Seek M.D.   On: 10/24/2023 20:10   US  EKG SITE RITE Result Date: 10/24/2023 If Site Rite image not attached, placement could not be confirmed due to current cardiac rhythm.   Scheduled Meds:  sodium chloride    Intravenous Once   acetaminophen   650 mg Oral Q8H   Or   acetaminophen   650 mg Rectal Q8H   amLODipine   5 mg Oral QHS   carbidopa -levodopa   1 tablet Oral QHS   Chlorhexidine  Gluconate Cloth  6 each Topical Daily   cholecalciferol   3,000 Units Oral Daily   vitamin B-12  2,000 mcg Oral Daily   finasteride   5 mg Oral Daily   gabapentin   300 mg Oral TID   multivitamin  1 tablet Oral BID   oxyCODONE   10 mg Oral 5 X Daily   pravastatin   20 mg Oral Daily   rOPINIRole   1 mg Oral QID   sodium chloride  flush  10-40 mL Intracatheter Q12H   tamsulosin   0.4 mg Oral q AM   terazosin   2 mg Oral QHS   Continuous Infusions:   ceFAZolin  (ANCEF ) IV 2 g (10/26/23 0550)   sodium chloride      sodium PHOSPHATE  IVPB (in mmol)       LOS: 5 days   Time spent: 45 minutes  Unk Garb, DO  [facility]  10/26/2023, 10:42 AM

## 2023-10-26 NOTE — Progress Notes (Signed)
 Mobility Specialist: Progress Note   10/26/23 1146  Mobility  Activity Ambulated with assistance in hallway  Level of Assistance Contact guard assist, steadying assist  Assistive Device Four wheel walker  Distance Ambulated (ft) 30 ft  RUE Weight Bearing Per Provider Order WBAT  LUE Weight Bearing Per Provider Order WBAT  Activity Response Tolerated well  Mobility Referral Yes  Mobility visit 1 Mobility  Mobility Specialist Start Time (ACUTE ONLY) 1007  Mobility Specialist Stop Time (ACUTE ONLY) 1027  Mobility Specialist Time Calculation (min) (ACUTE ONLY) 20 min    Pt was agreeable to mobility session - received in bed. Not feeling well and a little fatigued. SV for bed mobility. Light minA for boost to stand. CG for ambulation. Returned to room without fault. Felt a little better after ambulating. Left on EOB with all needs met, call bell in reach. Daughter present.  Justin [name] Mobility Specialist Please contact via SecureChat or Rehab office at [phone]

## 2023-10-27 DIAGNOSIS — Z452 Encounter for adjustment and management of vascular access device: Secondary | ICD-10-CM | POA: Diagnosis not present

## 2023-10-27 DIAGNOSIS — M720 Palmar fascial fibromatosis [Dupuytren]: Secondary | ICD-10-CM | POA: Diagnosis not present

## 2023-10-27 DIAGNOSIS — H353122 Nonexudative age-related macular degeneration, left eye, intermediate dry stage: Secondary | ICD-10-CM | POA: Diagnosis not present

## 2023-10-27 DIAGNOSIS — K219 Gastro-esophageal reflux disease without esophagitis: Secondary | ICD-10-CM | POA: Diagnosis not present

## 2023-10-27 DIAGNOSIS — F32A Depression, unspecified: Secondary | ICD-10-CM | POA: Diagnosis not present

## 2023-10-27 DIAGNOSIS — Z8673 Personal history of transient ischemic attack (TIA), and cerebral infarction without residual deficits: Secondary | ICD-10-CM | POA: Diagnosis not present

## 2023-10-27 DIAGNOSIS — Z791 Long term (current) use of non-steroidal anti-inflammatories (NSAID): Secondary | ICD-10-CM | POA: Diagnosis not present

## 2023-10-27 DIAGNOSIS — D469 Myelodysplastic syndrome, unspecified: Secondary | ICD-10-CM | POA: Diagnosis not present

## 2023-10-27 DIAGNOSIS — I2510 Atherosclerotic heart disease of native coronary artery without angina pectoris: Secondary | ICD-10-CM | POA: Diagnosis not present

## 2023-10-27 DIAGNOSIS — G629 Polyneuropathy, unspecified: Secondary | ICD-10-CM | POA: Diagnosis not present

## 2023-10-27 DIAGNOSIS — E876 Hypokalemia: Secondary | ICD-10-CM | POA: Diagnosis not present

## 2023-10-27 DIAGNOSIS — I10 Essential (primary) hypertension: Secondary | ICD-10-CM | POA: Diagnosis not present

## 2023-10-27 DIAGNOSIS — H409 Unspecified glaucoma: Secondary | ICD-10-CM | POA: Diagnosis not present

## 2023-10-27 DIAGNOSIS — Z792 Long term (current) use of antibiotics: Secondary | ICD-10-CM | POA: Diagnosis not present

## 2023-10-27 DIAGNOSIS — K5900 Constipation, unspecified: Secondary | ICD-10-CM | POA: Diagnosis not present

## 2023-10-27 DIAGNOSIS — G061 Intraspinal abscess and granuloma: Secondary | ICD-10-CM | POA: Diagnosis not present

## 2023-10-27 DIAGNOSIS — B9561 Methicillin susceptible Staphylococcus aureus infection as the cause of diseases classified elsewhere: Secondary | ICD-10-CM | POA: Diagnosis not present

## 2023-10-27 DIAGNOSIS — I739 Peripheral vascular disease, unspecified: Secondary | ICD-10-CM | POA: Diagnosis not present

## 2023-10-27 DIAGNOSIS — H353111 Nonexudative age-related macular degeneration, right eye, early dry stage: Secondary | ICD-10-CM | POA: Diagnosis not present

## 2023-10-27 DIAGNOSIS — M545 Low back pain, unspecified: Secondary | ICD-10-CM | POA: Diagnosis not present

## 2023-10-27 DIAGNOSIS — G8929 Other chronic pain: Secondary | ICD-10-CM | POA: Diagnosis not present

## 2023-10-27 DIAGNOSIS — Z87891 Personal history of nicotine dependence: Secondary | ICD-10-CM | POA: Diagnosis not present

## 2023-10-27 DIAGNOSIS — M009 Pyogenic arthritis, unspecified: Secondary | ICD-10-CM | POA: Diagnosis not present

## 2023-10-27 DIAGNOSIS — E538 Deficiency of other specified B group vitamins: Secondary | ICD-10-CM | POA: Diagnosis not present

## 2023-10-27 DIAGNOSIS — N400 Enlarged prostate without lower urinary tract symptoms: Secondary | ICD-10-CM | POA: Diagnosis not present

## 2023-10-27 LAB — TYPE AND SCREEN
ABO/RH(D): O NEG
Antibody Screen: NEGATIVE
Unit division: 0
Unit division: 0

## 2023-10-27 LAB — BPAM RBC
Blood Product Expiration Date: 202505012359
Blood Product Expiration Date: 202505072359
ISSUE DATE / TIME: 202504251101
ISSUE DATE / TIME: 202504251420
Unit Type and Rh: 9500
Unit Type and Rh: 9500

## 2023-10-28 LAB — CULTURE, BLOOD (ROUTINE X 2)
Culture: NO GROWTH
Culture: NO GROWTH
Special Requests: ADEQUATE
Special Requests: ADEQUATE

## 2023-10-28 LAB — AEROBIC/ANAEROBIC CULTURE W GRAM STAIN (SURGICAL/DEEP WOUND): Gram Stain: NONE SEEN

## 2023-10-28 NOTE — Anesthesia Postprocedure Evaluation (Signed)
 Anesthesia Post Note  Patient: Justin [name] [facility].  Procedure(s) Performed: TRANSESOPHAGEAL ECHOCARDIOGRAM     Patient location during evaluation: Cath Lab Anesthesia Type: MAC Level of consciousness: patient cooperative Pain management: pain level controlled Vital Signs Assessment: post-procedure vital signs reviewed and stable Respiratory status: spontaneous breathing, nonlabored ventilation and respiratory function stable Cardiovascular status: stable and blood pressure returned to baseline Postop Assessment: no apparent nausea or vomiting Anesthetic complications: no   No notable events documented.                 Shahan Starks

## 2023-10-29 DIAGNOSIS — M009 Pyogenic arthritis, unspecified: Secondary | ICD-10-CM | POA: Diagnosis not present

## 2023-10-29 NOTE — Telephone Encounter (Signed)
 Pharmacy Patient Advocate Encounter  Received notification from [facility] [employer] that Prior Authorization for Ondansetron  HCl 4MG  tablets  has been APPROVED from 10/26/2023 to 07/02/2024   PA #/Case ID/Reference #: [id number]

## 2023-10-30 NOTE — Op Note (Signed)
 NAME: Justin [name], SANTY MEDICAL RECORD NO: [id number] ACCOUNT NO: [id number] DATE OF BIRTH: October 25, 1940 FACILITY: [facility] LOCATION: [facility] PHYSICIAN: Homero Luster. Guyann Leitz, MD  Operative Report   DATE OF PROCEDURE: 10/21/2023  PREOPERATIVE DIAGNOSES: 1.  Left elbow septic arthritis. 2.  Left septic olecranon bursitis. 3.  Osteomyelitis, left humerus. 4.  Osteomyelitis of left ulna.  POSTOPERATIVE DIAGNOSES: 1.  Left elbow septic arthritis. 2.  Left septic olecranon bursitis. 3.  Osteomyelitis, left humerus. 4.  Osteomyelitis of left ulna.  PROCEDURES: 1.  Arthrotomy and complete synovectomy of the left elbow. 2.  Removal of deep implant, left humerus, medial and lateral. 3.  Removal of deep implant, left ulna. 4.  Complete bursectomy of left olecranon bursa. 5.  Partial excision, humerus. 6.  Partial excision, ulna. 7.  Removal of foreign body, deep left forearm. 8.  Complex closure, 6 cm, left elbow.  SURGEON:  Homero Luster. Guyann Leitz, MD  ASSISTANT:  Marisela Sicks, PA-C.  ANESTHESIA:  General.  COMPLICATIONS:  None.  SPECIMENS:  Multiple anaerobic and aerobic cultures sent to microbiology.  DISPOSITION:  The patient was dispositioned to PACU.  CONDITION:  Stable.  BRIEF SUMMARY OF INDICATIONS FOR PROCEDURE:  The patient is a very pleasant 83 year old male known to me from previous left elbow reconstruction over 12 years ago.  The patient noted steady and then rapid increase in left elbow pain and swelling, was  found to have sepsis with bacteremia.  Infectious Disease service consulted and medicine requested evaluation for surgical debridement.  I discussed with him and the family the risks and benefits of surgical treatment including persistent osteomyelitis,  loss of motion, new fracture in the area of the removed hardware, and multiple others including skin breakdown given the patient's thin body habitus.  They acknowledged these risks and did wish to proceed.  BRIEF SUMMARY OF  PROCEDURE:  The patient was taken to the operating room where general anesthesia was induced.  The left upper extremity was prepped and draped in the usual sterile fashion.  No tourniquet was used during the procedure.  After  chlorhexidine  wash, Betadine  scrub, paint, and draping, a timeout was held and began by remaking the lateral somewhat curvilinear incision, which was posterior proximally and then angled around the olecranon laterally.  I did continue this down the ulna  as well.  I was careful to watch for the radial and ulnar nerves and encountered at the deep layer a significant and large amount of purulence from the elbow joint.  This appeared to have both chronic and acute components.  Consequently, a complete  synovectomy was undertaken.  I carefully mobilized the periosteum and was able to get into the joint.  I outlined the synovium and completely removed it and all the periosteum deep into the joint along both the medial and lateral columns.  There was  considerable joint destruction there, but no instability.  I then was able to irrigate this thoroughly and identify and remove all the hardware from the humerus.  I had to make an auxiliary decision directly over the distal aspect of the medial plate  removing the small screws there.  Some of the small screws were broken, but I was able to use the broken hardware removal set to extract these.  One lag screw was left in place at the supracondylar region because it was completely overgrown with bone but  could be seen beneath the substance much like something frozen in ice.  As there was no tract communicating down  to it and removal would require a corticotomy and create a tract that could result in subsequent fracture at this high stress level, the  decision was made to leave it safely underneath the bone.  I then continued down to the ulna.  Here, the hardware was removed.  There was evidence of chronic infected bursitis directly over the two  most proximal screws in the olecranon process of the ulna.  The screws were removed and then very carefully the  entirety of the olecranon bursa itself was dissected trying to get the entirety of that pseudocapsule excised without devascularizing the overlying skin.  This bursectomy was somewhat tedious and extensile, but ultimately resulted in removal of that  entire area, which was contaminated.  Once all screws were out, including pins that were grasped deep within the joint during the synovectomy, we proceeded with partial excision of the bone.  This was accomplished with aggressive use of curettes going down into the screw holes and along any  of the pitted areas underneath the plate.  In so doing, we were able to remove all the fibrinous material from within the exposed cavities.  Again, fortunately, the integrity of the cortex in most areas was well preserved.  This was accomplished on both  the humerus and the ulna.  These bones were then washed very well and aggressively.  Fresh gloves and drapes were applied.  Lastly, attention was turned to the free portion of K-wire within the forearm.  I used C-arm to help localize this with a small clamp and was able to remove this from deep to the fascia with a needle driver through serial x-rays to better localize the  implant.  It was withdrawn without complication.  The wounds were irrigated thoroughly and closed in standard layered fashion using 0 PDS, 2-0 PDS, and 2-0 nylon sutures.  Far near, near far sutures were required for about 6 cm of the wound.  Sterile  gently compressive dressing and a bulky dressing was applied.  The patient was taken to the PACU in stable condition.  Marisela Sicks, PA-C, was present and assisted me throughout.  PROGNOSIS:  The patient remains on the medical service with consultations from both us  and the Infectious Disease service.  We anticipate a period of 4-6 weeks of IV antibiotics, transitioning to oral as conditions  warrant.  Some question of future study  for additional workup with intervention to be tailored accordingly.  The patient has no range of motion restrictions of the elbow, but has been counseled against excessive activity, potential for fracture and instability until sufficient healing has  occurred.   PUS D: 10/29/2023 6:36:57 pm T: 10/30/2023 7:50:00 am  JOB: [id number]/ [id number]

## 2023-10-31 ENCOUNTER — Telehealth: Payer: Self-pay

## 2023-10-31 NOTE — Telephone Encounter (Signed)
 Copied from CRM [id number]. Topic: Clinical - Home Health Verbal Orders >> Oct 30, 2023  4:24 PM Jenice Mitts wrote: Caller/Agency: [facility] Number: [phone] can leave verbal orders Service Requested: Physical Therapy Frequency: 2times a week for 4weeks - 1 time a week for 5 weeks Any new concerns about the patient? No

## 2023-10-31 NOTE — Telephone Encounter (Signed)
Okay with me. Thanks 

## 2023-11-01 NOTE — Telephone Encounter (Signed)
 LDVM on secure line for [facility] with the verbal ok for  Physical Therapy Frequency: 2times a week for 4weeks - 1 time a week for 5 weeks

## 2023-11-02 ENCOUNTER — Inpatient Hospital Stay (HOSPITAL_BASED_OUTPATIENT_CLINIC_OR_DEPARTMENT_OTHER): Admitting: Hematology and Oncology

## 2023-11-02 ENCOUNTER — Inpatient Hospital Stay: Attending: Physician Assistant

## 2023-11-02 ENCOUNTER — Inpatient Hospital Stay

## 2023-11-02 VITALS — BP 123/87 | HR 93 | Temp 98.0°F | Resp 16

## 2023-11-02 VITALS — HR 73 | Temp 97.9°F | Resp 14

## 2023-11-02 DIAGNOSIS — R600 Localized edema: Secondary | ICD-10-CM

## 2023-11-02 DIAGNOSIS — D4620 Refractory anemia with excess of blasts, unspecified: Secondary | ICD-10-CM | POA: Diagnosis not present

## 2023-11-02 DIAGNOSIS — M7989 Other specified soft tissue disorders: Secondary | ICD-10-CM | POA: Diagnosis not present

## 2023-11-02 DIAGNOSIS — Z79899 Other long term (current) drug therapy: Secondary | ICD-10-CM | POA: Insufficient documentation

## 2023-11-02 DIAGNOSIS — D469 Myelodysplastic syndrome, unspecified: Secondary | ICD-10-CM

## 2023-11-02 DIAGNOSIS — M009 Pyogenic arthritis, unspecified: Secondary | ICD-10-CM | POA: Diagnosis not present

## 2023-11-02 LAB — CBC WITH DIFFERENTIAL (CANCER CENTER ONLY)
Abs Immature Granulocytes: 0.16 10*3/uL — ABNORMAL HIGH (ref 0.00–0.07)
Basophils Absolute: 0 10*3/uL (ref 0.0–0.1)
Basophils Relative: 0 %
Eosinophils Absolute: 0 10*3/uL (ref 0.0–0.5)
Eosinophils Relative: 0 %
HCT: 25.6 % — ABNORMAL LOW (ref 39.0–52.0)
Hemoglobin: 8.1 g/dL — ABNORMAL LOW (ref 13.0–17.0)
Immature Granulocytes: 2 %
Lymphocytes Relative: 14 %
Lymphs Abs: 1 10*3/uL (ref 0.7–4.0)
MCH: 24 pg — ABNORMAL LOW (ref 26.0–34.0)
MCHC: 31.6 g/dL (ref 30.0–36.0)
MCV: 76 fL — ABNORMAL LOW (ref 80.0–100.0)
Monocytes Absolute: 1.9 10*3/uL — ABNORMAL HIGH (ref 0.1–1.0)
Monocytes Relative: 26 %
Neutro Abs: 4.2 10*3/uL (ref 1.7–7.7)
Neutrophils Relative %: 58 %
Platelet Count: 135 10*3/uL — ABNORMAL LOW (ref 150–400)
RBC: 3.37 MIL/uL — ABNORMAL LOW (ref 4.22–5.81)
RDW: 19.2 % — ABNORMAL HIGH (ref 11.5–15.5)
WBC Count: 7.2 10*3/uL (ref 4.0–10.5)
nRBC: 0 % (ref 0.0–0.2)

## 2023-11-02 LAB — CMP (CANCER CENTER ONLY)
ALT: <5 U/L (ref 0–44)
AST: 19 U/L (ref 15–41)
Albumin: 3.4 g/dL — ABNORMAL LOW (ref 3.5–5.0)
Alkaline Phosphatase: 135 U/L — ABNORMAL HIGH (ref 38–126)
Anion gap: 10 (ref 5–15)
BUN: 39 mg/dL — ABNORMAL HIGH (ref 8–23)
CO2: 26 mmol/L (ref 22–32)
Calcium: 8.8 mg/dL — ABNORMAL LOW (ref 8.9–10.3)
Chloride: 93 mmol/L — ABNORMAL LOW (ref 98–111)
Creatinine: 2.15 mg/dL — ABNORMAL HIGH (ref 0.61–1.24)
GFR, Estimated: 30 mL/min — ABNORMAL LOW (ref >60)
Glucose, Bld: 94 mg/dL (ref 70–99)
Potassium: 3.6 mmol/L (ref 3.5–5.1)
Sodium: 129 mmol/L — ABNORMAL LOW (ref 135–145)
Total Bilirubin: 0.5 mg/dL (ref 0.0–1.2)
Total Protein: 6.9 g/dL (ref 6.5–8.1)

## 2023-11-02 MED ORDER — EPOETIN ALFA 20000 UNIT/ML IJ SOLN
20000.0000 [IU] | Freq: Once | INTRAMUSCULAR | Status: AC
  Administered 2023-11-02: 20000 [IU] via SUBCUTANEOUS
  Filled 2023-11-02: qty 1

## 2023-11-02 NOTE — Progress Notes (Signed)
 [facility] [phone]   [phone]  PROGRESS NOTE  Patient Care Team: Plotnikov, Oakley Bellman, MD as PCP - General Euell Herrlich, MD as PCP - Cardiology (Cardiology) Alvis Jourdain, MD as Consulting Physician (Gastroenterology) Swazil[place], Amy, MD as Consulting Physician (Dermatology) Daryel Ensign, DO as Consulting Physician (Neurology) Adelaide Adjutant, MD as Consulting Physician (Physical Medicine and Rehabilitation) Claiborne Crew, MD as Consulting Physician (Orthopedic Surgery) Szabat, Tino Foreman, [facility] (Inactive) as Pharmacist (Pharmacist) Ander Bame, MD as Consulting Physician (Hematology and Oncology)  Hematological/Oncological History # Pancytopenia #Myelodysplastic Syndrome, Single Lineage Dysplasia 11/18/2021: WBC 2.4, ANC 1.1, Hgb 9.7, MCV 84.0, Plt 58. 12/07/2021: Establish care with Dr. Rosaline Coma and [facility] Hearing 12/19/2021: Bone marrow biopsy which showed a low-grade myelodysplastic syndrome.  6/28/203: Started retacrit  20,000 units q 2 weeks  Interval History:  Justin [name] Justin [name]. 83 y.o. male with medical history significant for newly diagnosed low-grade myelodysplastic syndrome who presents for a follow up visit. The patient's last visit was on 04/12/2023. In the interim, he continues on retacrit  injections q 2 weeks.  He also underwent a left septic open olecranon bursitis and osteomyelitis surgery on 10/26/2023.  He required a blood transfusion due to hemoglobin dropped to 6.9.  On exam today Mr. Beigel reports he underwent surgery on 10/26/2023 and was discharged in the hospital on Friday.  Since then his drain has been poor.  He was having some swelling in his left upper extremity but that has improved.  He reports that he is having swelling on the left side of his body predominantly in the left leg as well as some in the right leg.  He reports he received 2 units of packed red blood cells and tolerated the transfusions well.  He  reports that he still having some difficulty walking.  He is currently taking antibiotics for 6 weeks time 3/day.  He reports that this is being administered via his PICC line.  He reports that he is not having any infectious symptoms at the moment such as fevers, chills, sweats.  His appetite is good.  He notes he is doing his best to try to eat better and drink protein shakes.  He otherwise denies any nausea, vomiting, or diarrhea.  Full 10 point ROS is otherwise negative.  He is willing and able to proceed with Epo shots today.  MEDICAL HISTORY:  Past Medical History:  Diagnosis Date   Alcoholism (HCC)    Dry 13 years   Basal cell carcinoma    Right Chest   BPH (benign prostatic hyperplasia)    Diverticulosis of colon    Dizzy spells    none recent   DJD (degenerative joint disease)    lower back   Elbow fracture, left 2009   Dr Guyann Leitz   FRACTURE, [place], LEFT 12/23/2007   Qualifier: Diagnosis of   By: Marthe Slain        GERD (gastroesophageal reflux disease)    Glaucoma    History of TIAs 1 yrs ago   Hyperkeratosis    LBP (low back pain)    Macular degeneration    left wet right dry sees dr Seward Dao   Melanoma [facility]) 2009   x3 Dr Amy Swaziland   Osteoarthritis    Restless leg syndrome    Thrombocytopenia (HCC) 11/18/2021   Tinnitus    Tobacco abuse    Uses walker    Wears glasses    reading   Wears partial dentures  upper    SURGICAL HISTORY: Past Surgical History:  Procedure Laterality Date   CATARACT EXTRACTION Bilateral 2018   HARDWARE REMOVAL Left 07/09/2020   Procedure: Left elbow hardware removal;  Surgeon: Janeth Medicus, MD;  Location: [facility];  Service: Orthopedics;  Laterality: Left;  60 mins   HARDWARE REMOVAL Left 10/23/2023   Procedure: REMOVAL, HARDWARE;  Surgeon: Hardy Lia, MD;  Location: [facility] OR;  Service: Orthopedics;  Laterality: Left;   IRRIGATION AND DEBRIDEMENT ELBOW Left 10/23/2023   Procedure: IRRIGATION AND  DEBRIDEMENT ELBOW;  Surgeon: Hardy Lia, MD;  [facility];  Service: Orthopedics;  Laterality: Left;   mda     Dr. Seward Dao wet injection   MELANOMA EXCISION  2009   x3   RECONSTRUCTION MEDIAL COLLATERAL LIGAMENT ELBOW W/ TENDON GRAFT  2009   Left, Dr Guyann Leitz   TONSILLECTOMY  as child   TOTAL KNEE ARTHROPLASTY Right 11/09/2020   Procedure: TOTAL KNEE ARTHROPLASTY;  Surgeon: Claiborne Crew, MD;  Location: [facility] ORS;  Service: Orthopedics;  Laterality: Right;   TRANSESOPHAGEAL ECHOCARDIOGRAM (CATH LAB) N/A 10/24/2023   Procedure: TRANSESOPHAGEAL ECHOCARDIOGRAM;  Surgeon: Hugh Madura, MD;  Location: [facility] INVASIVE CV LAB;  Service: Cardiovascular;  Laterality: N/A;    SOCIAL HISTORY: Social History   Socioeconomic History   Marital status: Married    Spouse name: Renda Carpen   Number of children: 2   Years of education: Not on file   Highest education level: Not on file  Occupational History   Occupation: [place] Veteran - [employer]    Employer: RETIRED   Occupation: Scientist, research (physical sciences)   Occupation: Games developer  Tobacco Use   Smoking status: Former    Current packs/day: 0.00    Average packs/day: 3.0 packs/day for 38.0 years (114.0 ttl pk-yrs)    Types: Cigarettes    Start date: 23    Quit date: 1986    Years since quitting: 39.3   Smokeless tobacco: Never   Tobacco comments:    states he quit May 15 1985  Vaping Use   Vaping status: Never Used  Substance and Sexual Activity   Alcohol  use: No    Comment: PREVIOUS - DRY 49yrs   Drug use: Not Currently   Sexual activity: Not Currently  Other Topics Concern   Not on file  Social History Narrative   Right handed   One story home   Drinks coffee q am      Son and wife lives with him-2025   Social Drivers of Health   Financial Resource Strain: Low Risk  (07/20/2023)   Overall Financial Resource Strain (CARDIA)    Difficulty of Paying Living Expenses: Not very hard  Food Insecurity: No Food Insecurity  (10/21/2023)   Hunger Vital Sign    Worried About Running Out of Food in the Last Year: Never true    Ran Out of Food in the Last Year: Never true  Transportation Needs: No Transportation Needs (10/21/2023)   PRAPARE - Administrator, Civil Service (Medical): No    Lack of Transportation (Non-Medical): No  Physical Activity: Inactive (07/20/2023)   Exercise Vital Sign    Days of Exercise per Week: 0 days    Minutes of Exercise per Session: 0 min  Stress: No Stress Concern Present (07/20/2023)   [employer] of Occupational Health - Occupational Stress Questionnaire    Feeling of Stress : Not at all  Social Connections: Moderately Isolated (10/21/2023)   Social Connection  and Isolation Panel [NHANES]    Frequency of Communication with Friends and Family: More than three times a week    Frequency of Social Gatherings with Friends and Family: Three times a week    Attends Religious Services: Never    Active Member of Clubs or Organizations: No    Attends Banker Meetings: Never    Marital Status: Married  Catering manager Violence: Not At Risk (10/21/2023)   Humiliation, Afraid, Rape, and Kick questionnaire    Fear of Current or Ex-Partner: No    Emotionally Abused: No    Physically Abused: No    Sexually Abused: No    FAMILY HISTORY: Family History  Problem Relation Age of Onset   Cancer Mother        ?breast   Cancer Father        Lung    ALLERGIES:  is allergic to levofloxacin.  MEDICATIONS:  Current Outpatient Medications  Medication Sig Dispense Refill   acetaminophen  (TYLENOL ) 500 MG tablet Take 1,000 mg by mouth every 6 (six) hours as needed for mild pain (pain score 1-3) or moderate pain (pain score 4-6).     amLODipine  (NORVASC ) 5 MG tablet TAKE 1 TABLET BY MOUTH DAILY (Patient taking differently: Take 5 mg by mouth at bedtime.) 90 tablet 3   calcium  carbonate (TUMS EX) 750 MG chewable tablet Chew 1,500 mg by mouth daily as needed for  heartburn.     carbidopa -levodopa  (SINEMET  IR) 25-100 MG tablet For breakthrough restless leg, you can take 1 tablet at bedtime.  OK to take extra tab as needed during the day. (Patient taking differently: Take 1 tablet by mouth at bedtime.) 100 tablet 3   ceFAZolin  (ANCEF ) IVPB Inject 2 g into the vein every 8 (eight) hours. Indication:  MSSA septic joint +  lumbar septic arthritis/epidural abscess First Dose: yes Last Day of Therapy:  12/18/23 Labs - Once weekly:  CBC/D and BMP, Labs - Once weekly: ESR and CRP Method of administration: IV Push or 6g daily as a continuous infusion based on family preference Method of administration may be changed at the discretion of home infusion pharmacist based upon assessment of the patient and/or caregiver's ability to self-administer the medication ordered. 160 Units 0   Cholecalciferol  1000 UNITS tablet Take 3,000 Units by mouth daily.     Cyanocobalamin  (VITAMIN B-12) 1000 MCG SUBL DISSOLVE 2 TABLETS IN MOUTH EVERY DAY 180 tablet 1   finasteride  (PROSCAR ) 5 MG tablet TAKE 1 TABLET (5 MG TOTAL) BY MOUTH DAILY. 90 tablet 1   furosemide  (LASIX ) 20 MG tablet LASIX  20 MG AS NEEDED FOR 3 POUND WEIGHT GAIN OVER NIGHT AND 5 POUND WEIGHT GAIN IN A WEEK. 30 tablet 6   gabapentin  (NEURONTIN ) 300 MG capsule Take 1 capsule (300 mg total) by mouth 3 (three) times daily.     ibuprofen (ADVIL) 200 MG tablet Take 400 mg by mouth every 6 (six) hours as needed for mild pain (pain score 1-3) or moderate pain (pain score 4-6).     magnesium  hydroxide (MILK OF MAGNESIA) 400 MG/5ML suspension Take 15 mLs by mouth daily as needed for mild constipation.     meclizine  (ANTIVERT ) 12.5 MG tablet TAKE 1 TABLET BY MOUTH 3 TIMES DAILY AS NEEDED FOR DIZZINESS. 90 tablet 2   Multiple Vitamins-Minerals (PRESERVISION AREDS PO) Take 2 tablets by mouth 2 (two) times daily.     ondansetron  (ZOFRAN ) 4 MG tablet Take 1 tablet (4 mg total) by mouth every  6 (six) hours as needed for nausea or  vomiting. 30 tablet 0   Oxycodone  HCl 10 MG TABS Take 1 tablet (10 mg total) by mouth 5 (five) times daily for 5 days. 25 tablet 0   Polyethyl Glycol-Propyl Glycol (SYSTANE OP) Place 1 drop into both eyes 2 (two) times daily as needed (dry eyes). [facility] O[facility]     pravastatin  (PRAVACHOL ) 20 MG tablet TAKE 1 TABLET (20 MG TOTAL) BY MOUTH DAILY. 90 tablet 1   rOPINIRole  (REQUIP ) 2 MG tablet TAKE 1 TABLET BY MOUTH AT AT NOON AND 1 TAB AT 4PM AND 1 TAB AT BEDTIME (Patient taking differently: Take 1 mg by mouth in the morning, at noon, in the evening, and at bedtime.) 270 tablet 1   tamsulosin  (FLOMAX ) 0.4 MG CAPS capsule Take 1 capsule (0.4 mg total) by mouth in the morning. 90 capsule 1   terazosin  (HYTRIN ) 2 MG capsule TAKE 1 CAPSULE BY MOUTH EVERY NIGHT AT BEDTIME 90 capsule 1   No current facility-administered medications for this visit.    REVIEW OF SYSTEMS:   Constitutional: ( - ) fevers, ( - )  chills , ( - ) night sweats Eyes: ( - ) blurriness of vision, ( - ) double vision, ( - ) watery eyes Ears, nose, mouth, throat, and face: ( - ) mucositis, ( - ) sore throat Respiratory: ( - ) cough, ( - ) dyspnea, ( - ) wheezes Cardiovascular: ( - ) palpitation, ( - ) chest discomfort, ( - ) lower extremity swelling Gastrointestinal:  ( - ) nausea, ( - ) heartburn, ( - ) change in bowel habits Skin: ( - ) abnormal skin rashes Lymphatics: ( - ) new lymphadenopathy, ( - ) easy bruising Neurological: ( - ) numbness, ( - ) tingling, ( - ) new weaknesses Behavioral/Psych: ( - ) mood change, ( - ) new changes  All other systems were reviewed with the patient and are negative.  PHYSICAL EXAMINATION:  Vitals:   11/02/23 1115  Pulse: 73  Resp: 14  Temp: 97.9 F (36.6 C)  SpO2: 99%        There were no vitals filed for this visit.       GENERAL: Well-appearing elderly Caucasian male, alert, no distress and comfortable SKIN: skin color, texture, turgor are normal, no rashes or significant  lesions EYES: conjunctiva are pink and non-injected, sclera clear LUNGS: clear to auscultation and percussion with normal breathing effort HEART: regular rate & rhythm and no murmurs and no lower extremity edema Musculoskeletal: no cyanosis of digits and no clubbing  PSYCH: alert & oriented x 3, fluent speech NEURO: no focal motor/sensory deficits  LABORATORY DATA:  I have reviewed the data as listed    Latest Ref Rng & Units 10/26/2023    3:30 AM 10/25/2023    6:56 AM 10/24/2023    6:52 AM  CBC  WBC 4.0 - 10.5 K/uL 9.1  11.7  9.5   Hemoglobin 13.0 - 17.0 g/dL 6.9  7.1  7.8   Hematocrit 39.0 - 52.0 % 23.1  23.6  25.2   Platelets 150 - 400 K/uL 121  106  69        Latest Ref Rng & Units 10/24/2023    6:52 AM 10/23/2023    4:52 AM 10/22/2023   10:05 AM  CMP  Glucose 70 - 99 mg/dL 161  096  045   BUN 8 - 23 mg/dL 37  26  19   Creatinine  0.61 - 1.24 mg/dL 3.08  6.57  8.46   Sodium 135 - 145 mmol/L 138  132  134   Potassium 3.5 - 5.1 mmol/L 4.1  3.6  3.3   Chloride 98 - 111 mmol/L 106  101  104   CO2 22 - 32 mmol/L 22  21  21    Calcium  8.9 - 10.3 mg/dL 8.3  8.2  8.3     Lab Results  Component Value Date   MPROTEIN Not Observed 12/07/2021   Lab Results  Component Value Date   KPAFRELGTCHN 45.3 (H) 12/07/2021   LAMBDASER 28.9 (H) 12/07/2021   KAPLAMBRATIO 1.57 12/07/2021   RADIOGRAPHIC STUDIES: MR Lumbar Spine W Wo Contrast Result Date: 10/24/2023 CLINICAL DATA:  Back pain. EXAM: MRI LUMBAR SPINE WITHOUT AND WITH CONTRAST TECHNIQUE: Multiplanar and multiecho pulse sequences of the lumbar spine were obtained without and with intravenous contrast. CONTRAST:  8mL GADAVIST  GADOBUTROL  1 MMOL/ML IV SOLN COMPARISON:  None Available. FINDINGS: Segmentation:  Standard. Alignment: Dextroscoliosis with apex at L4. Grade 1 retrolisthesis at L3-4 and L4-5 and grade 1 anterolisthesis at L5-S1. Vertebrae: There is opposing endplate edema at N6-2 with a small amount of fluid within the posterior  disc space. There is also endplate edema at X52-84, L4-5 and L5-S1. There is L5-S1 facet edema. No acute fracture. Conus medullaris and cauda equina: Conus extends to the T12 level. Conus and cauda equina appear normal. Paraspinal and other soft tissues: There is a left eccentric and dorsal heterogeneous and peripherally contrast-enhancing collection that extends from L4-S1. Small fluid collections within the left dorsal paraspinous muscles. There is edema within the left psoas muscles with an area of probable phlegmonous change. Disc levels: T12-L1: Unremarkable. L1-L2: Small disc bulge with endplate spurring. No spinal canal stenosis. Severe right neural foraminal stenosis. L2-L3: Small disc bulge with bilateral endplate spurring. No spinal canal stenosis. Mild right neural foraminal stenosis. L3-L4: Left asymmetric disc bulge with endplate spurring. Left lateral recess narrowing without central spinal canal stenosis. Mild right and moderate left neural foraminal stenosis. L4-L5: Small ventral component of the above described fluid collection. Left lateral recess narrowing without central spinal canal stenosis. Mass effect on the left L5 nerve root in the lateral recess. Severe left neural foraminal stenosis. L5-S1: Moderate facet arthrosis with left facet edema. As above, heterogeneous collection along the dorsal and left side of the epidural space. There is narrowing of the left lateral recess with mass effect on the left S1 nerve root. No central spinal canal stenosis. Moderate right and severe left neural foraminal stenosis. IMPRESSION: 1. Findings concerning for septic arthritis at the left L5-S1 facet. 2. 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 3. Edema within the left psoas muscles with an area of probable phlegmonous change. Small fluid collections in the left dorsal paraspinous muscles, consistent with myositis. 4. Severe right L1-2, left L4-5 and left L5-S1 neural foraminal stenosis. 5. Left  lateral recess narrowing at L4-5 and L5-S1 with mass effect on the left L5 and S1 nerve roots. Electronically Signed   By: Juanetta Nordmann M.D.   On: 10/24/2023 22:06   MR HIP LEFT W WO CONTRAST Result Date: 10/24/2023 CLINICAL DATA:  Concern for septic arthritis. Known MSSA bacteremia. EXAM: MRI OF THE LEFT HIP WITHOUT AND WITH CONTRAST TECHNIQUE: Multiplanar, multisequence MR imaging was performed both before and after administration of intravenous contrast. CONTRAST:  8mL GADAVIST  GADOBUTROL  1 MMOL/ML IV SOLN COMPARISON:  None Available. FINDINGS: Soft tissue and Muscle: There is diffuse subcutaneous  edema extending along the visualized lower abdominal wall and into the bilateral proximal hips. There is relatively symmetric bilateral diffuse intramuscular and interstitial edema of the visualized proximal thighs, most notably involving the bilateral gluteus medius, adductor and anterior compartment musculature. No significant associated enhancement. No discrete loculated intramuscular collection identified.Hamstring tendon origins are intact.Gluteal cuff tendon insertions are intact.Trace pelvic free fluid. Bones/Hip: No acute osseous abnormality. Femoral heads are seated within the acetabula. No hip joint effusion. No periarticular edema or enhancement. Diffuse red marrow reconversion. IMPRESSION: 1. No evidence of septic arthritis of the left hip. No acute osseous abnormality. 2. Diffuse subcutaneous edema of the visualized lower abdominal wall extending through the bilateral proximal hips with symmetric bilateral relatively diffuse intramuscular and interstitial edema of the proximal thighs. No evidence of enhancement. No fluid collection. These findings are nonspecific and could reflect systemic inflammatory response in the setting of known MSSA bacteremia, non-suppurative myositis, or volume overload. 3. Trace nonspecific pelvic free fluid. Electronically Signed   By: Mannie Seek M.D.   On: 10/24/2023  20:10   ECHO TEE Result Date: 10/24/2023    TRANSESOPHOGEAL ECHO REPORT   Patient Name:   Ender Seccombe [facility]. Date of Exam: 10/24/2023 Medical Rec #:  [id number]              Height:       69.0 in Accession #:    [id number]             Weight:       168.2 lb Date of Birth:  [birth year]              BSA:          1.919 m Patient Age:    82 years               BP:           152/68 mmHg Patient Gender: M                      HR:           63 bpm. Exam Location:  Inpatient Procedure: Transesophageal Echo, 3D Echo, Color Doppler and Cardiac Doppler            (Both Spectral and Color Flow Doppler were utilized during            procedure). Indications:     Fever  History:         Patient has prior history of Echocardiogram examinations, most                  recent 10/16/2023. CAD; Risk Factors:Hypertension.  Sonographer:     Sherline Distel Senior RDCS Referring Phys:  [id number] Hugh Madura Diagnosing Phys: Dorothye Gathers MD PROCEDURE: After discussion of the risks and benefits of a TEE, an informed consent was obtained from the patient. The transesophogeal probe was passed without difficulty through the esophogus of the patient. Sedation performed by different physician. The patient was monitored while under deep sedation. Anesthestetic sedation was provided intravenously by Anesthesiology: 193mg  of Propofol , 80mg  of Lidocaine . The patient's vital signs; including heart rate, blood pressure, and oxygen saturation; remained stable throughout the procedure. The patient developed no complications during the procedure.  IMPRESSIONS  1. Left ventricular ejection fraction, by estimation, is 55 to 60%. The left ventricle has normal function. The left ventricle has no regional wall motion abnormalities.  2. Right ventricular systolic function is normal. The right ventricular  size is normal.  3. No left atrial/left atrial appendage thrombus was detected.  4. The mitral valve is normal in structure. Mild mitral valve regurgitation. No  evidence of mitral stenosis.  5. The aortic valve is tricuspid. Aortic valve regurgitation is not visualized. No aortic stenosis is present.  6. There is Moderate (Grade III) plaque.  7. The inferior vena cava is normal in size with greater than 50% respiratory variability, suggesting right atrial pressure of 3 mmHg.  8. 3D performed of the LAA and demonstrates No thrombus. Conclusion(s)/Recommendation(s): No evidence of vegetation/infective endocarditis on this transesophageael echocardiogram. FINDINGS  Left Ventricle: Left ventricular ejection fraction, by estimation, is 55 to 60%. The left ventricle has normal function. The left ventricle has no regional wall motion abnormalities. The left ventricular internal cavity size was normal in size. There is  no left ventricular hypertrophy. Right Ventricle: The right ventricular size is normal. No increase in right ventricular wall thickness. Right ventricular systolic function is normal. Left Atrium: Left atrial size was normal in size. No left atrial/left atrial appendage thrombus was detected. Right Atrium: Right atrial size was normal in size. Pericardium: There is no evidence of pericardial effusion. Mitral Valve: The mitral valve is normal in structure. Mild mitral valve regurgitation. No evidence of mitral valve stenosis. Tricuspid Valve: The tricuspid valve is normal in structure. Tricuspid valve regurgitation is trivial. No evidence of tricuspid stenosis. Aortic Valve: The aortic valve is tricuspid. Aortic valve regurgitation is not visualized. No aortic stenosis is present. Pulmonic Valve: The pulmonic valve was normal in structure. Pulmonic valve regurgitation is not visualized. No evidence of pulmonic stenosis. Aorta: The aortic root is normal in size and structure. There is moderate (Grade III) plaque. Venous: The inferior vena cava is normal in size with greater than 50% respiratory variability, suggesting right atrial pressure of 3 mmHg. IAS/Shunts: No  atrial level shunt detected by color flow Doppler. Additional Comments: Spectral Doppler performed. Dorothye Gathers MD Electronically signed by Dorothye Gathers MD Signature Date/Time: 10/24/2023/12:13:13 PM    Final    US  EKG SITE RITE Result Date: 10/24/2023 If Site Rite image not attached, placement could not be confirmed due to current cardiac rhythm.  EP STUDY Result Date: 10/24/2023 See surgical note for result.  DG Elbow 2 Views Left Result Date: 10/23/2023 CLINICAL DATA:  Septic joint EXAM: LEFT ELBOW - 2 VIEW COMPARISON:  Left elbow x-ray 10/21/2023. FINDINGS: There is diffuse soft tissue swelling surrounding the elbow. Orthopedic hardware has been removed within the proximal ulna and distal humerus. A single horizontal screw persists in the distal humerus. The bones are diffusely osteopenic. No definite acute fracture identified. There are degenerative changes between the distal humerus and ulna. The radial head appears subluxed/dislocated anteriorly. There is some periosteal reaction along the posterior aspect of the distal humerus. IMPRESSION: 1. Diffuse soft tissue swelling surrounding the elbow. 2. The radial head appears subluxed/dislocated anteriorly. 3. No definite acute fracture identified. 4. Orthopedic hardware has been removed within the proximal ulna and distal humerus. A single horizontal screw persists in the distal humerus. Electronically Signed   By: Tyron Gallon M.D.   On: 10/23/2023 20:08   DG ELBOW COMPLETE LEFT (3+VIEW) Result Date: 10/23/2023 CLINICAL DATA:  Elective surgery.  Removal of hardware. EXAM: LEFT ELBOW - COMPLETE 3+ VIEW COMPARISON:  Radiograph 10/21/2023 FINDINGS: Four fluoroscopic spot views of the left elbow submitted from the operating room. The previous hardware has been removed except for a single screw in the distal humerus.  Fluoroscopic time 20.7 seconds. Dose 1.02 mGy. IMPRESSION: Intraoperative fluoroscopy during hardware removal. Electronically Signed   By:  Chadwick Colonel M.D.   On: 10/23/2023 16:18   DG C-Arm 1-60 Min-No Report Result Date: 10/23/2023 Fluoroscopy was utilized by the requesting physician.  No radiographic interpretation.   DG C-Arm 1-60 Min-No Report Result Date: 10/23/2023 Fluoroscopy was utilized by the requesting physician.  No radiographic interpretation.   DG Elbow Complete Left Result Date: 10/21/2023 CLINICAL DATA:  Swelling. Hot, red, mobile. Left elbow pain radiating down left arm. Left elbow surgery years ago. EXAM: LEFT ELBOW - COMPLETE 3+ VIEW COMPARISON:  Left elbow radiographs 12/20/2007 FINDINGS: Remote distal lateral and distal medial humeral plate and screw fracture fixation, and proximal posterior olecranon plate and screw fracture fixation, also seen on 12/20/2007 prior radiographs. Bone overlap limits evaluation, however there appears to be severe capitellum-radial head and trochlea-coronoid joint space narrowing. Subchondral sclerosis is seen within the proximal coronoid. There may be partial mid medial elbow arthrodesis. No acute fracture or dislocation.  No evidence of hardware failure. No definite elbow joint effusion. There is a linear 22 x less than 1 mm metallic possible needle within the soft tissues medial to the proximal diaphysis of the ulna, appearing within approximately 1.2 cm of the medial proximal forearm skin surface. IMPRESSION: 1. Remote distal humeral and proximal olecranon plate and screw fracture fixation. No evidence of hardware failure. 2. 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 3. There is a linear 22 x less than 1 mm metallic possible needle within the soft tissues medial to the proximal diaphysis of the ulna, appearing within approximately 1.2 cm of the medial proximal forearm skin surface. Recommend clinical correlation. Electronically Signed   By: Bertina Broccoli M.D.   On: 10/21/2023 12:15   ECHOCARDIOGRAM COMPLETE Result Date: 10/16/2023    ECHOCARDIOGRAM REPORT    Patient Name:   July Melchi [facility]. Date of Exam: 10/16/2023 Medical Rec #:  [id number]              Height:       69.0 in Accession #:    [id number]             Weight:       162.4 lb Date of Birth:  [birth year]              BSA:          1.891 m Patient Age:    82 years               BP:           112/66 mmHg Patient Gender: M                      HR:           77 bpm. Exam Location:  Outpatient Procedure: 2D Echo, 3D Echo, Cardiac Doppler, Color Doppler and Strain Analysis            (Both Spectral and Color Flow Doppler were utilized during            procedure). Indications:    R60.0 Lower extremity edema  History:        Patient has prior history of Echocardiogram examinations, most                 recent 10/22/2019. CAD, Signs/Symptoms:Edema; Risk                 Factors:Hypertension and Former  Smoker. Patient denies chest                 pain and SOB. He does have bilateral leg edema.  Sonographer:    Richarda Chance RVT, RDCS (AE), RDMS Referring Phys: [id number] [place] IMPRESSIONS  1. Left ventricular ejection fraction, by estimation, is 55 to 60%. The left ventricle has normal function. The left ventricle has no regional wall motion abnormalities. Left ventricular diastolic parameters were normal. The average left ventricular global longitudinal strain is -18.7 %. The global longitudinal strain is normal.  2. Right ventricular systolic function is normal. The right ventricular size is normal.  3. Left atrial size was moderately dilated.  4. The mitral valve is abnormal. Mild mitral valve regurgitation. No evidence of mitral stenosis.  5. The aortic valve is tricuspid. There is mild calcification of the aortic valve. There is mild thickening of the aortic valve. Aortic valve regurgitation is trivial. Aortic valve sclerosis is present, with no evidence of aortic valve stenosis.  6. The inferior vena cava is dilated in size with >50% respiratory variability, suggesting right atrial pressure of 8 mmHg.  Comparison(s): EF 60%, mild LVH. FINDINGS  Left Ventricle: Left ventricular ejection fraction, by estimation, is 55 to 60%. The left ventricle has normal function. The left ventricle has no regional wall motion abnormalities. The average left ventricular global longitudinal strain is -18.7 %. Strain was performed and the global longitudinal strain is normal. The left ventricular internal cavity size was normal in size. There is no left ventricular hypertrophy. Left ventricular diastolic parameters were normal. Right Ventricle: The right ventricular size is normal. No increase in right ventricular wall thickness. Right ventricular systolic function is normal. Left Atrium: Left atrial size was moderately dilated. Right Atrium: Right atrial size was normal in size. Pericardium: There is no evidence of pericardial effusion. Mitral Valve: The mitral valve is abnormal. There is moderate thickening of the mitral valve leaflet(s). Mild mitral valve regurgitation. No evidence of mitral valve stenosis. Tricuspid Valve: The tricuspid valve is normal in structure. Tricuspid valve regurgitation is mild . No evidence of tricuspid stenosis. Aortic Valve: The aortic valve is tricuspid. There is mild calcification of the aortic valve. There is mild thickening of the aortic valve. Aortic valve regurgitation is trivial. Aortic valve sclerosis is present, with no evidence of aortic valve stenosis. Aortic valve mean gradient measures 3.0 mmHg. Aortic valve peak gradient measures 6.7 mmHg. Aortic valve area, by VTI measures 3.32 cm. Pulmonic Valve: The pulmonic valve was normal in structure. Pulmonic valve regurgitation is mild. No evidence of pulmonic stenosis. Aorta: The aortic root is normal in size and structure. Venous: The inferior vena cava is dilated in size with greater than 50% respiratory variability, suggesting right atrial pressure of 8 mmHg. IAS/Shunts: No atrial level shunt detected by color flow Doppler. Additional  Comments: 3D was performed not requiring image post processing on an independent workstation and was normal.  LEFT VENTRICLE PLAX 2D LVIDd:         4.38 cm   Diastology LVIDs:         2.90 cm   LV e' medial:    7.37 cm/s LV PW:         0.88 cm   LV E/e' medial:  11.5 LV IVS:        0.82 cm   LV e' lateral:   14.00 cm/s LVOT diam:     2.29 cm   LV E/e' lateral: 6.1 LV SV:  87 LV SV Index:   46        2D Longitudinal Strain LVOT Area:     4.12 cm  2D Strain GLS Avg:     -18.7 %                           3D Volume EF:                          3D EF:        57 %                          LV EDV:       161 ml                          LV ESV:       69 ml                          LV SV:        92 ml RIGHT VENTRICLE RV S prime:     11.20 cm/s TAPSE (M-mode): 2.6 cm LEFT ATRIUM              Index        RIGHT ATRIUM           Index LA diam:        4.88 cm  2.58 cm/m   RA Area:     19.60 cm LA Vol (A2C):   156.0 ml 82.50 ml/m  RA Volume:   53.70 ml  28.40 ml/m LA Vol (A4C):   139.0 ml 73.51 ml/m LA Biplane Vol: 149.0 ml 78.79 ml/m  AORTIC VALVE                    PULMONIC VALVE AV Area (Vmax):    2.98 cm     PV Vmax:       0.98 m/s AV Area (Vmean):   3.21 cm     PV Peak grad:  3.9 mmHg AV Area (VTI):     3.32 cm AV Vmax:           129.00 cm/s AV Vmean:          83.100 cm/s AV VTI:            0.262 m AV Peak Grad:      6.7 mmHg AV Mean Grad:      3.0 mmHg LVOT Vmax:         93.20 cm/s LVOT Vmean:        64.800 cm/s LVOT VTI:          0.211 m LVOT/AV VTI ratio: 0.81  AORTA Ao Root diam: 3.69 cm Ao Asc diam:  3.50 cm Ao Arch diam: 2.9 cm MITRAL VALVE MV Area (PHT): 3.89 cm    SHUNTS MV Decel Time: 195 msec    Systemic VTI:  0.21 m MV E velocity: 85.00 cm/s  Systemic Diam: 2.29 cm MV A velocity: 60.30 cm/s MV E/A ratio:  1.41 Janelle Mediate MD Electronically signed by Janelle Mediate MD Signature Date/Time: 10/16/2023/11:52:58 AM    Final     ASSESSMENT & PLAN Georgena Kingdom Justin [name]. Is a 83 y.o. male who returns  for a follow up for MDS.   # Myelodysplastic Syndrome,  Single Lineage Dysplasia -- At this time Mr. 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 --Given that his hemoglobin is consistently less than 10 I would recommend we proceed with erythropoietin  therapy. Discussed with the patient other options including hypomethylating therapies and Luspatercept , however given that anemia is his primary issue would recommend erythropoietin  to see if this helps to elevate his other blood counts as well. --No clear signs of nutritional deficiency during our prior evaluation. --MDS FISH study was normal.  --Started retacrit  20,000 units q 2 weeks on 12/28/2021.  PLAN: --Due for retacrit  injection today --Labs from today reviewed. Hgb 8.1, MCV 76, platelets 135, white blood cell 7.2 --Will continue to monitor as long as platelet count is >20K. Could consider Promacta vs Nplate injections if Plt levels drop  --Continue with q 2 week retacrit  injections as scheduled --RTC in 12 weeks time to assure he is tolerating the shots   No orders of the defined types were placed in this encounter.   All questions were answered. The patient knows to call the clinic with any problems, questions or concerns.  A total of more than 30 minutes were spent on this encounter with face-to-face time and non-face-to-face time, including preparing to see the patient, ordering tests and/or medications, counseling the patient and coordination of care as outlined above.   Rogerio Clay, MD Department of Hematology/Oncology [facility] Phone: [phone] Pager: [phone] Email: [email or web address]  11/02/2023 11:21 AM

## 2023-11-05 DIAGNOSIS — M009 Pyogenic arthritis, unspecified: Secondary | ICD-10-CM | POA: Diagnosis not present

## 2023-11-06 ENCOUNTER — Ambulatory Visit (INDEPENDENT_AMBULATORY_CARE_PROVIDER_SITE_OTHER): Admitting: Internal Medicine

## 2023-11-06 ENCOUNTER — Encounter: Payer: Self-pay | Admitting: Internal Medicine

## 2023-11-06 ENCOUNTER — Telehealth: Payer: Self-pay

## 2023-11-06 VITALS — HR 76 | Temp 97.4°F | Ht 69.0 in

## 2023-11-06 DIAGNOSIS — D469 Myelodysplastic syndrome, unspecified: Secondary | ICD-10-CM

## 2023-11-06 DIAGNOSIS — G061 Intraspinal abscess and granuloma: Secondary | ICD-10-CM

## 2023-11-06 DIAGNOSIS — B9561 Methicillin susceptible Staphylococcus aureus infection as the cause of diseases classified elsewhere: Secondary | ICD-10-CM | POA: Diagnosis not present

## 2023-11-06 DIAGNOSIS — M1990 Unspecified osteoarthritis, unspecified site: Secondary | ICD-10-CM | POA: Diagnosis not present

## 2023-11-06 DIAGNOSIS — M71122 Other infective bursitis, left elbow: Secondary | ICD-10-CM | POA: Diagnosis not present

## 2023-11-06 DIAGNOSIS — R601 Generalized edema: Secondary | ICD-10-CM

## 2023-11-06 DIAGNOSIS — M71129 Other infective bursitis, unspecified elbow: Secondary | ICD-10-CM | POA: Insufficient documentation

## 2023-11-06 DIAGNOSIS — R7881 Bacteremia: Secondary | ICD-10-CM | POA: Diagnosis not present

## 2023-11-06 DIAGNOSIS — E538 Deficiency of other specified B group vitamins: Secondary | ICD-10-CM | POA: Diagnosis not present

## 2023-11-06 DIAGNOSIS — G062 Extradural and subdural abscess, unspecified: Secondary | ICD-10-CM

## 2023-11-06 DIAGNOSIS — B9689 Other specified bacterial agents as the cause of diseases classified elsewhere: Secondary | ICD-10-CM

## 2023-11-06 LAB — CBC WITH DIFFERENTIAL/PLATELET
Basophils Absolute: 0 10*3/uL (ref 0.0–0.1)
Basophils Relative: 0.9 % (ref 0.0–3.0)
Eosinophils Absolute: 0 10*3/uL (ref 0.0–0.7)
Eosinophils Relative: 0.2 % (ref 0.0–5.0)
HCT: 24.4 % — ABNORMAL LOW (ref 39.0–52.0)
Hemoglobin: 7.9 g/dL — CL (ref 13.0–17.0)
Lymphocytes Relative: 12.5 % (ref 12.0–46.0)
Lymphs Abs: 0.6 10*3/uL — ABNORMAL LOW (ref 0.7–4.0)
MCHC: 32.3 g/dL (ref 30.0–36.0)
MCV: 75.2 fl — ABNORMAL LOW (ref 78.0–100.0)
Monocytes Absolute: 1.6 10*3/uL — ABNORMAL HIGH (ref 0.1–1.0)
Monocytes Relative: 33.4 % — ABNORMAL HIGH (ref 3.0–12.0)
Neutro Abs: 2.5 10*3/uL (ref 1.4–7.7)
Neutrophils Relative %: 53 % (ref 43.0–77.0)
Platelets: 97 10*3/uL — ABNORMAL LOW (ref 150.0–400.0)
RBC: 3.24 Mil/uL — ABNORMAL LOW (ref 4.22–5.81)
RDW: 21.4 % — ABNORMAL HIGH (ref 11.5–15.5)
WBC: 4.7 10*3/uL (ref 4.0–10.5)

## 2023-11-06 LAB — COMPREHENSIVE METABOLIC PANEL WITH GFR
ALT: 2 U/L (ref 0–53)
AST: 16 U/L (ref 0–37)
Albumin: 3.3 g/dL — ABNORMAL LOW (ref 3.5–5.2)
Alkaline Phosphatase: 101 U/L (ref 39–117)
BUN: 48 mg/dL — ABNORMAL HIGH (ref 6–23)
CO2: 25 meq/L (ref 19–32)
Calcium: 9 mg/dL (ref 8.4–10.5)
Chloride: 96 meq/L (ref 96–112)
Creatinine, Ser: 2.46 mg/dL — ABNORMAL HIGH (ref 0.40–1.50)
GFR: 23.47 mL/min — ABNORMAL LOW (ref >60.00)
Glucose, Bld: 115 mg/dL — ABNORMAL HIGH (ref 70–99)
Potassium: 3.7 meq/L (ref 3.5–5.1)
Sodium: 131 meq/L — ABNORMAL LOW (ref 135–145)
Total Bilirubin: 0.4 mg/dL (ref 0.2–1.2)
Total Protein: 6.6 g/dL (ref 6.0–8.3)

## 2023-11-06 MED ORDER — TORSEMIDE 100 MG PO TABS: 50.0000 mg | ORAL_TABLET | Freq: Every day | ORAL | 1 refills | Status: DC

## 2023-11-06 NOTE — Assessment & Plan Note (Signed)
Chronic - Dr Nelva Bush ?On Norco - d/c ?On Oxycodone per Dr Nelva Bush ? Potential benefits of a long term opioids use as well as potential risks (i.e. addiction risk, apnea etc) and complications (i.e. Somnolence, constipation and others) were explained to the patient and were aknowledged. ? ?Blue-Emu cream was recommended to use 2-3 times a day ?

## 2023-11-06 NOTE — Assessment & Plan Note (Signed)
On Vit B12 

## 2023-11-06 NOTE — Telephone Encounter (Signed)
 CRITICAL VALUE STICKER  CRITICAL VALUE: Hemoglobin 7.9  RECEIVER (on-site recipient of call): Shelagh Derrick  DATE & TIME NOTIFIED: 11/06/2023 @ 3:45pm  MESSENGER (representative from lab): Mariah Shines  MD NOTIFIED: Yes  TIME OF NOTIFICATION: 3:46 pm

## 2023-11-06 NOTE — Progress Notes (Signed)
 Subjective:  Patient ID: Justin Kingdom [name]., male    DOB: 12-18-1940  Age: 83 y.o. MRN: [id number]  CC: Hospitalization Follow-up [facility] follow up 04/20-04/25 staphylococcal arthritis of left elbow. Currently performing physical therapy and occupational therapy. Also following up with surgeon tomorrow. Has pick-line in right arm)   HPI Justin [name]. presents for L elbow septic bursitis C/o edema - legs, B feet. F/u on CRF Here w/dtr [name]  Per hx: " Admit date:     10/21/2023  Discharge date: 10/26/2023  Attending Physician: Raymona Caldwell [[id number]]  Discharge Physician: Unk Garb    PCP: Genia Kettering, MD   Admitted From: Home   Disposition:  Home   Recommendations for Outpatient Follow-up:  Follow up with PCP in 1-2 weeks Follow up with orthopedics as scheduled Follow up with neurosurgery. Appointment will be called Follow up with Infectious Disease clinic as scheduled. Please follow up on the following pending results: intra-operative cultures   Home Health:Yes. Home PT, OT, RN Equipment/Devices: PICC LINE 10-25-2023 Date Placed   Discharge Condition:Stable CODE STATUS:FULL Diet recommendation: Regular Fluid Restriction: None   Hospital Summary: HPI: Justin Whittet. is a 83 y.o. male with medical history significant of HTN, CAD, BPH, hx of TIAs, DDD/chronic low back pain, MDS, thrombocytopenia, depression who presented to ED with complaints of acute on chronic left elbow pain. He started to have pain out of the ordinary last night around 8pm. He denies any trauma. This morning he called his daughter and she went over to his house. He was to weak and in too much pain to even get into the car so she called EMS. He can not move his left elbow at all. Pain 10/10. Pain radiates down his entire hand. He denies any fever, chilled at times. NO N/V. It is warm to touch, not really sure it's changed in color.    He has history of an open fracture in  his left elbow in 2007 and had surgery. About 4 years ago one of the pins came lose and this was taken out. At baseline now he can not fully extend or flex to 90 degrees but has some movement in it. He can't used his hands at all. He states his hands are like a claw. He is right handed.      He has been feeling good. Denies any fever/chills, vision changes/headaches, chest pain or palpitations, shortness of breath or cough, abdominal pain, N/V/D, dysuria or leg swelling.      Denies any fever, vision changes/headaches, chest pain or palpitations, shortness of breath or cough, abdominal pain, N/V/D, dysuria or leg swelling.    He does not smoke or drink alcohol .    ER Course:  vitals: afebrile, bp: 133/69, HR; 78, RR: 16, oxygen: 94% Pertinent labs: WBC: 13, hgb: 9.0, platelets 104, potassium: 3.1,  Left elbow xray: Remote distal humeral and proximal olecranon plate and screw fracture fixation. No evidence of hardware failure. 2. 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 3. There is a linear 22 x less than 1 mm metallic possible needle within the soft tissues medial to the proximal diaphysis of the ulna, appearing within approximately 1.2 cm of the medial proximal forearm skin surface. Recommend clinical correlation. In ED: given potassium, cefepime  and vanc and BC obtained. Lactic acid wnl. Fluid obtained from elbow. TRH asked to admit.    Significant Events: Admitted 10/21/2023 acute on chronic left elbow pain     Significant Labs: WBC  13.0, HgB 9.0, plt 104 ESR 12 CRP 9.3 Blood cx 4-20 Staph Aureus. MSSA   Significant Imaging Studies: Left elbow XR Remote distal humeral and proximal olecranon plate and screw fracture fixation. No evidence of hardware failure. 2. 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 3. There is a linear 22 x less than 1 mm metallic possible needle within the soft tissues medial to the proximal diaphysis of the  ulna, appearing within approximately 1.2 cm of the medial proximal forearm skin surface. Recommend clinical correlation.     Antibiotic Therapy: Anti-infectives (From admission, onward)      Start     Dose/Rate Route Frequency Ordered Stop    10/22/23 1700   vancomycin  (VANCOREADY) IVPB 1250 mg/250 mL  Status:  Discontinued        1,250 mg 166.7 mL/hr over 90 Minutes Intravenous Every 24 hours 10/21/23 1914 10/22/23 0619    10/22/23 0715   ceFAZolin  (ANCEF ) IVPB 2g/100 mL premix        2 g 200 mL/hr over 30 Minutes Intravenous Every 8 hours 10/22/23 0619      10/21/23 2000   cefTRIAXone  (ROCEPHIN ) 2 g in sodium chloride  0.9 % 100 mL IVPB  Status:  Discontinued        2 g 200 mL/hr over 30 Minutes Intravenous Every 24 hours 10/21/23 1907 10/22/23 0619    10/21/23 1530   vancomycin  (VANCOREADY) IVPB 1500 mg/300 mL        1,500 mg 150 mL/hr over 120 Minutes Intravenous  Once 10/21/23 1519 10/21/23 1818    10/21/23 1530   ceFEPIme  (MAXIPIME ) 2 g in sodium chloride  0.9 % 100 mL IVPB        2 g 200 mL/hr over 30 Minutes Intravenous  Once 10/21/23 1519 10/21/23 1649           Procedures: 10-24-2023 I&D left elbow 10-25-2023 TEE 10-25-2023 PICC line placement   Consultants: Orthopedics ID  Acute on chronic pain of left elbow concerning for septic arthritis 10-21-2023 through 10-24-2023. 83 year old presenting to ED with acute onset of pain in his left elbow, decreased mobility, warm to touch and labs concerning for possibly infected joint. He has history of an open fracture in his left elbow in 2007 and had surgery with hardware. About 4 years ago one of the pins came lose and this was taken out.  -admit to [facility] -ortho consulted (Dr. Bernard Brick)  -joint aspiration obtained by EDP and described as purulent. Gram stain showing gram positive cocci -continue vancomycin . Change cefepime  to rocephin   -check inflammatory markers -check uric acid  -no hx of gout or pseudo gout, but follow synovial fluid  for crystals  -trend PCT  -CT/other imaging per ortho  -pain control difficult. Continue home oxycodone  five times/day. Morphine  q 2 hours for severe pain, may need dilaudid , but transferring to [facility].    10-24-2023 continue with IV Ancef  for MSSA septicemia. TEE negative for vegetations. Going to MRI left hip and lumbar spine today.  S/p left elbow hardware removal on 10-23-2023   10-25-2023 awaiting [name] and PICC orders from ID. Family plans on taking pt home. Discussed with pt's dtr [name]   10-26-2023 [name] orders verified. PICC placed. Ready for DC today after PRBC transfusion.   MSSA bacteremia 10-24-2023 ID following. On IV Ancef . [name] to be determined by ID.   10-25-2023 awaiting [name] and PICC orders from ID. Family plans on taking pt home. Discussed with pt's dtr [name].   10-26-2023 Regimen: Cefazolin   2g IV every 8 hours. End date: 12/18/23 (8 weeks from OR 4/22)   Abscess in epidural space of lumbar spine 10-25-2023 lumbar epidural abscess L4-S1 seen on MRI lumbar spine. ID requested Neurosurgery consult. Discussed with Dr. Larrie Po with NS. He does not think pt will need operative intervention at this time but will see patient in consultation today. Dtr beth aware of epidural abscess.   10-26-2023 Regimen: Cefazolin  2g IV every 8 hours. End date: 12/18/23 (8 weeks from OR 4/22). F/u with Dr. Larrie Po with neurosurgery.   Hypokalemia 10-23-2023 through 10-23-2023. Check magnesium . Repleted in ED. Trend    10-24-2023 resolved. K 4.1 today.   MDS (myelodysplastic syndrome) (HCC) 10-21-2023 through 10-22-2021. -followed by Dr. Rosaline Coma. Diagnosed 12/2021. Myelodysplastic Syndrome, Single Lineage Dysplasia -12/19/2021: Bone marrow biopsy which showed a low-grade myelodysplastic syndrome.  - Started retacrit  20,000 units q 2 weeks   10-24-2023 stable.   10-25-2023 stable.   10-26-2023 2 units PRBC ordered for transfusion due to HgB of 6.9. PRBC will be given prior to DC. Give lasix  20 mg IV  after transfusion.   GERD (gastroesophageal reflux disease) 10-24-2023 On TUMs PRN    10-25-2023 stable.   10-26-2023 stable.   Coronary artery disease 10-21-2023 through 10-23-2023 Lexiscan  stress test 2/19 was overall low risk with no ischemia identified.   CT cardiac scoring 07/2018 showed coronary calcium  score of 41 (15th percentile). Continue statin/ASA    10-24-2023 stable.   10-25-2023 stable.   10-26-2023 stable.   Essential hypertension 10-24-2023 Continue norvasc , hytrin    10-25-2023 stable.   10-26-2023 stable.   BPH (benign prostatic hyperplasia) 10-24-2023 Continue flomax  and proscar     10-25-2023 stable.   10-26-2023 stable.   Low back pain 10-21-2023 through 10-24-2023 Continue home oxycodone  10mg  5x/day. Continue steroid taper (2 more days). PMP website verified.    10-24-2023 stable. On oxycodone .   10-25-2023 due to epidural abscess. Continue oxycodone .   10-26-2023 stable.   RLS (restless legs syndrome) 10-24-2023 Continue requip , gabapentin  and sinemet     10-25-2023 stable.   10-26-2023 stable.       Discharge Diagnoses:  Principal Problem:   Acute on chronic pain of left elbow concerning for septic arthritis Active Problems:   MSSA bacteremia   Abscess in epidural space of lumbar spine   RLS (restless legs syndrome)   Low back pain   BPH (benign prostatic hyperplasia)   Essential hypertension   Coronary artery disease   GERD (gastroesophageal reflux disease)   MDS (myelodysplastic syndrome) (HCC)   Hypokalemia   Epidural abscess  " Outpatient Medications Prior to Visit  Medication Sig Dispense Refill  . acetaminophen  (TYLENOL ) 500 MG tablet Take 1,000 mg by mouth every 6 (six) hours as needed for mild pain (pain score 1-3) or moderate pain (pain score 4-6).    . amLODipine  (NORVASC ) 5 MG tablet TAKE 1 TABLET BY MOUTH DAILY (Patient taking differently: Take 5 mg by mouth at bedtime.) 90 tablet 3  . calcium  carbonate (TUMS EX) 750  MG chewable tablet Chew 1,500 mg by mouth daily as needed for heartburn.    . carbidopa -levodopa  (SINEMET  IR) 25-100 MG tablet For breakthrough restless leg, you can take 1 tablet at bedtime.  OK to take extra tab as needed during the day. (Patient taking differently: Take 1 tablet by mouth at bedtime.) 100 tablet 3  . ceFAZolin  (ANCEF ) IVPB Inject 2 g into the vein every 8 (eight) hours. Indication:  MSSA septic joint +  lumbar septic arthritis/epidural abscess  First Dose: yes Last Day of Therapy:  12/18/23 Labs - Once weekly:  CBC/D and BMP, Labs - Once weekly: ESR and CRP Method of administration: IV Push or 6g daily as a continuous infusion based on family preference Method of administration may be changed at the discretion of home infusion pharmacist based upon assessment of the patient and/or caregiver's ability to self-administer the medication ordered. 160 Units 0  . Cholecalciferol  1000 UNITS tablet Take 3,000 Units by mouth daily.    . Cyanocobalamin  (VITAMIN B-12) 1000 MCG SUBL DISSOLVE 2 TABLETS IN MOUTH EVERY DAY 180 tablet 1  . finasteride  (PROSCAR ) 5 MG tablet TAKE 1 TABLET (5 MG TOTAL) BY MOUTH DAILY. 90 tablet 1  . gabapentin  (NEURONTIN ) 300 MG capsule Take 1 capsule (300 mg total) by mouth 3 (three) times daily.    Aaron Aas ibuprofen (ADVIL) 200 MG tablet Take 400 mg by mouth every 6 (six) hours as needed for mild pain (pain score 1-3) or moderate pain (pain score 4-6).    . magnesium  hydroxide (MILK OF MAGNESIA) 400 MG/5ML suspension Take 15 mLs by mouth daily as needed for mild constipation.    . meclizine  (ANTIVERT ) 12.5 MG tablet TAKE 1 TABLET BY MOUTH 3 TIMES DAILY AS NEEDED FOR DIZZINESS. 90 tablet 2  . Multiple Vitamins-Minerals (PRESERVISION AREDS PO) Take 2 tablets by mouth 2 (two) times daily.    . ondansetron  (ZOFRAN ) 4 MG tablet Take 1 tablet (4 mg total) by mouth every 6 (six) hours as needed for nausea or vomiting. 30 tablet 0  . Polyethyl Glycol-Propyl Glycol (SYSTANE OP)  Place 1 drop into both eyes 2 (two) times daily as needed (dry eyes). [facility] O[facility]    . pravastatin  (PRAVACHOL ) 20 MG tablet TAKE 1 TABLET (20 MG TOTAL) BY MOUTH DAILY. 90 tablet 1  . rOPINIRole  (REQUIP ) 2 MG tablet TAKE 1 TABLET BY MOUTH AT AT NOON AND 1 TAB AT 4PM AND 1 TAB AT BEDTIME (Patient taking differently: Take 1 mg by mouth in the morning, at noon, in the evening, and at bedtime.) 270 tablet 1  . tamsulosin  (FLOMAX ) 0.4 MG CAPS capsule Take 1 capsule (0.4 mg total) by mouth in the morning. 90 capsule 1  . terazosin  (HYTRIN ) 2 MG capsule TAKE 1 CAPSULE BY MOUTH EVERY NIGHT AT BEDTIME 90 capsule 1  . furosemide  (LASIX ) 20 MG tablet LASIX  20 MG AS NEEDED FOR 3 POUND WEIGHT GAIN OVER NIGHT AND 5 POUND WEIGHT GAIN IN A WEEK. 30 tablet 6  . Oxycodone  HCl 10 MG TABS Take 1 tablet (10 mg total) by mouth 5 (five) times daily for 5 days. 25 tablet 0   No facility-administered medications prior to visit.    ROS: Review of Systems  Constitutional:  Positive for fatigue. Negative for appetite change and unexpected weight change.  HENT:  Negative for congestion, nosebleeds, sneezing, sore throat and trouble swallowing.   Eyes:  Negative for itching and visual disturbance.  Respiratory:  Positive for shortness of breath. Negative for cough.   Cardiovascular:  Positive for leg swelling. Negative for chest pain and palpitations.  Gastrointestinal:  Negative for abdominal distention, blood in stool, diarrhea and nausea.  Genitourinary:  Negative for frequency and hematuria.  Musculoskeletal:  Positive for arthralgias, back pain and gait problem. Negative for joint swelling and neck pain.  Skin:  Positive for wound. Negative for rash.  Neurological:  Positive for weakness. Negative for dizziness, tremors and speech difficulty.  Hematological:  Bruises/bleeds easily.  Psychiatric/Behavioral:  Positive  for confusion and decreased concentration. Negative for agitation, dysphoric mood, sleep disturbance and  suicidal ideas. The patient is not nervous/anxious.     Objective:  Pulse 76   Temp (!) 97.4 F (36.3 C)   Ht 5\' 9"  (1.753 m)   SpO2 98%   BMI 24.84 kg/m   BP Readings from Last 3 Encounters:  11/02/23 123/87  10/26/23 (!) 159/67  10/11/23 (!) 155/81    Wt Readings from Last 3 Encounters:  10/21/23 168 lb 3.4 oz (76.3 kg)  09/11/23 162 lb 6.4 oz (73.7 kg)  09/04/23 162 lb (73.5 kg)    Physical Exam Constitutional:      General: He is not in acute distress.    Appearance: He is well-developed. He is ill-appearing. He is not toxic-appearing.     Comments: NAD  Eyes:     Conjunctiva/sclera: Conjunctivae normal.     Pupils: Pupils are equal, round, and reactive to light.  Neck:     Thyroid : No thyromegaly.     Vascular: No JVD.  Cardiovascular:     Rate and Rhythm: Normal rate and regular rhythm.     Heart sounds: Normal heart sounds. No murmur heard.    No friction rub. No gallop.  Pulmonary:     Effort: Pulmonary effort is normal. No respiratory distress.     Breath sounds: Normal breath sounds. No wheezing or rales.  Chest:     Chest wall: No tenderness.  Abdominal:     General: Bowel sounds are normal. There is no distension.     Palpations: Abdomen is soft. There is no mass.     Tenderness: There is no abdominal tenderness. There is no guarding or rebound.  Musculoskeletal:        General: Tenderness present. Normal range of motion.     Cervical back: Normal range of motion.     Right lower leg: Edema present.     Left lower leg: Edema present.  Lymphadenopathy:     Cervical: No cervical adenopathy.  Skin:    General: Skin is warm and dry.     Findings: No rash.  Neurological:     Mental Status: He is alert and oriented to person, place, and time.     Cranial Nerves: No cranial nerve deficit.     Motor: No abnormal muscle tone.     Coordination: Coordination normal.     Gait: Gait normal.     Deep Tendon Reflexes: Reflexes are normal and symmetric.   Psychiatric:        Behavior: Behavior normal.        Thought Content: Thought content normal.        Judgment: Judgment normal.  edema - legs 3+ B, B feet; 2+ L hand PICC line    A total time of 45 minutes was spent preparing to see the patient, reviewing tests, x-rays, operative reports and other medical records.  Also, obtaining history and performing comprehensive physical exam.  Additionally, counseling the patient regarding the above listed issues.   Finally, documenting clinical information in the health records, coordination of care, educating the patient. It is a complex case.  Lab Results  Component Value Date   WBC 7.2 11/02/2023   HGB 8.1 (L) 11/02/2023   HCT 25.6 (L) 11/02/2023   PLT 135 (L) 11/02/2023   GLUCOSE 94 11/02/2023   CHOL 136 01/12/2017   TRIG 67.0 01/12/2017   HDL 54.70 01/12/2017   LDLCALC 68 01/12/2017   ALT <5  11/02/2023   AST 19 11/02/2023   NA 129 (L) 11/02/2023   K 3.6 11/02/2023   CL 93 (L) 11/02/2023   CREATININE 2.15 (H) 11/02/2023   BUN 39 (H) 11/02/2023   CO2 26 11/02/2023   TSH 3.05 10/16/2019   PSA 0.47 08/30/2016   INR 1.1 10/28/2020   HGBA1C 5.3 04/19/2021    DG Elbow Complete Left Result Date: 10/21/2023 CLINICAL DATA:  Swelling. Hot, red, mobile. Left elbow pain radiating down left arm. Left elbow surgery years ago. EXAM: LEFT ELBOW - COMPLETE 3+ VIEW COMPARISON:  Left elbow radiographs 12/20/2007 FINDINGS: Remote distal lateral and distal medial humeral plate and screw fracture fixation, and proximal posterior olecranon plate and screw fracture fixation, also seen on 12/20/2007 prior radiographs. Bone overlap limits evaluation, however there appears to be severe capitellum-radial head and trochlea-coronoid joint space narrowing. Subchondral sclerosis is seen within the proximal coronoid. There may be partial mid medial elbow arthrodesis. No acute fracture or dislocation.  No evidence of hardware failure. No definite elbow joint  effusion. There is a linear 22 x less than 1 mm metallic possible needle within the soft tissues medial to the proximal diaphysis of the ulna, appearing within approximately 1.2 cm of the medial proximal forearm skin surface. IMPRESSION: 1. Remote distal humeral and proximal olecranon plate and screw fracture fixation. No evidence of hardware failure. 2. 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 3. There is a linear 22 x less than 1 mm metallic possible needle within the soft tissues medial to the proximal diaphysis of the ulna, appearing within approximately 1.2 cm of the medial proximal forearm skin surface. Recommend clinical correlation. Electronically Signed   By: Bertina Broccoli M.D.   On: 10/21/2023 12:15    Assessment & Plan:   Problem List Items Addressed This Visit     Osteoarthritis   Chronic - Dr Rexanne Catalina On Norco - d/c On Oxycodone  per Dr Rexanne Catalina  Potential benefits of a long term opioids use as well as potential risks (i.e. addiction risk, apnea etc) and complications (i.e. Somnolence, constipation and others) were explained to the patient and were aknowledged.  Blue-Emu cream was recommended to use 2-3 times a day      Edema   Worse Demadex 50-100 mg po Increase protein intake Hydrate well NAS diet      Relevant Orders   CBC with Differential/Platelet   Comprehensive metabolic panel with GFR   Vitamin B12 deficiency   On Vit B12      MDS (myelodysplastic syndrome) (HCC)   F/u w/Dr Rosaline Coma      Relevant Orders   CBC with Differential/Platelet   Comprehensive metabolic panel with GFR   MSSA bacteremia   On IV Cefazolin  at home      Abscess of epidural space of spine due to bacteria    lumbar epidural abscess L4-S1   On Cefazolin  2g IV tid at home      Epidural abscess   On IV Cefazolin  at home      Septic bursitis of elbow - Primary   S/p surgery F/u w/Dr Guyann Leitz On Cefazolin  2g IV tid at home         Meds ordered this encounter   Medications  . torsemide (DEMADEX) 100 MG tablet    Sig: Take 0.5-1 tablets (50-100 mg total) by mouth daily.    Dispense:  90 tablet    Refill:  1      Follow-up: Return in about 4 weeks (around 12/04/2023) for  a follow-up visit.  Anitra Barn, MD

## 2023-11-06 NOTE — Assessment & Plan Note (Signed)
 F/u w/Dr Rosaline Coma

## 2023-11-06 NOTE — Assessment & Plan Note (Signed)
 Worse Demadex 50-100 mg po Increase protein intake Hydrate well NAS diet

## 2023-11-06 NOTE — Assessment & Plan Note (Signed)
 S/p surgery F/u w/Dr Guyann Leitz On Cefazolin  2g IV tid at home

## 2023-11-06 NOTE — Assessment & Plan Note (Signed)
 On IV Cefazolin  at home

## 2023-11-06 NOTE — Assessment & Plan Note (Addendum)
 lumbar epidural abscess L4-S1   On Cefazolin  2g IV tid at home

## 2023-11-07 ENCOUNTER — Other Ambulatory Visit: Payer: Self-pay | Admitting: Internal Medicine

## 2023-11-07 ENCOUNTER — Telehealth: Payer: Self-pay | Admitting: Pharmacist

## 2023-11-07 ENCOUNTER — Encounter: Payer: Self-pay | Admitting: Internal Medicine

## 2023-11-07 DIAGNOSIS — M00822 Arthritis due to other bacteria, left elbow: Secondary | ICD-10-CM | POA: Diagnosis not present

## 2023-11-07 DIAGNOSIS — M86132 Other acute osteomyelitis, left radius and ulna: Secondary | ICD-10-CM | POA: Diagnosis not present

## 2023-11-07 DIAGNOSIS — M86022 Acute hematogenous osteomyelitis, left humerus: Secondary | ICD-10-CM | POA: Diagnosis not present

## 2023-11-07 MED ORDER — TORSEMIDE 100 MG PO TABS: 50.0000 mg | ORAL_TABLET | Freq: Every day | ORAL | 1 refills | Status: DC

## 2023-11-07 NOTE — Telephone Encounter (Signed)
 Patient's baseline Scr ~1. Received fax today that Scr this week increased to 2.3; further up to 2.46 during internal medicine visit in Epic. Currently on Ancef  2g q8hr for MSSA PJI/epidural abscess. Based on current renal function, CrCl ~20-25 ml/min. Will need to reduce Ancef  dose to 1g q12hr. Please let me know if further changes need to be made. Expected duration is through 12/18/23 (total of 8 weeks).   Nicklas Barns, PharmD, CPP, BCIDP, AAHIVP Clinical Pharmacist Practitioner Infectious Diseases Clinical Pharmacist [facility]

## 2023-11-07 NOTE — Telephone Encounter (Signed)
 Copied from CRM [id number]. Topic: Clinical - Medication Question >> Nov 07, 2023  8:56 AM Palma Bob wrote: Reason for CRM: Patient daughter is calling in because patient was seen yesterday with Dr. He prescribe some medication torsemide (DEMADEX) 100 MG tablet but [facility] is saying its on back order and they don't know when they will have. Daughter is asking if you can send it to [facility] or [facility].Liane Redman can be reached at [phone]

## 2023-11-07 NOTE — Telephone Encounter (Signed)
 Noted! Thank you

## 2023-11-08 ENCOUNTER — Inpatient Hospital Stay: Payer: PPO

## 2023-11-08 DIAGNOSIS — M009 Pyogenic arthritis, unspecified: Secondary | ICD-10-CM | POA: Diagnosis not present

## 2023-11-08 NOTE — Telephone Encounter (Signed)
 Noted and thank you - I added Dr. Gillian Lacrosse on here since she has a follow up with him in 1 month.   Will put a reminder to check on labs again next week - not clear to my why he had acute AKI aside from under hydrating from looking at PCP visit.

## 2023-11-12 ENCOUNTER — Ambulatory Visit (INDEPENDENT_AMBULATORY_CARE_PROVIDER_SITE_OTHER): Admitting: Emergency Medicine

## 2023-11-12 ENCOUNTER — Encounter: Payer: Self-pay | Admitting: Emergency Medicine

## 2023-11-12 ENCOUNTER — Ambulatory Visit: Payer: Self-pay

## 2023-11-12 ENCOUNTER — Telehealth: Payer: Self-pay

## 2023-11-12 VITALS — BP 122/74 | HR 62 | Temp 98.3°F | Ht 69.0 in

## 2023-11-12 DIAGNOSIS — B9689 Other specified bacterial agents as the cause of diseases classified elsewhere: Secondary | ICD-10-CM | POA: Diagnosis not present

## 2023-11-12 DIAGNOSIS — A46 Erysipelas: Secondary | ICD-10-CM | POA: Diagnosis not present

## 2023-11-12 DIAGNOSIS — G061 Intraspinal abscess and granuloma: Secondary | ICD-10-CM

## 2023-11-12 DIAGNOSIS — R7881 Bacteremia: Secondary | ICD-10-CM | POA: Diagnosis not present

## 2023-11-12 DIAGNOSIS — I10 Essential (primary) hypertension: Secondary | ICD-10-CM | POA: Diagnosis not present

## 2023-11-12 DIAGNOSIS — B9561 Methicillin susceptible Staphylococcus aureus infection as the cause of diseases classified elsewhere: Secondary | ICD-10-CM | POA: Diagnosis not present

## 2023-11-12 DIAGNOSIS — R32 Unspecified urinary incontinence: Secondary | ICD-10-CM | POA: Diagnosis not present

## 2023-11-12 DIAGNOSIS — D469 Myelodysplastic syndrome, unspecified: Secondary | ICD-10-CM | POA: Diagnosis not present

## 2023-11-12 MED ORDER — DOXYCYCLINE HYCLATE 100 MG PO TABS: 100.0000 mg | ORAL_TABLET | Freq: Two times a day (BID) | ORAL | 0 refills | Status: AC

## 2023-11-12 NOTE — Progress Notes (Signed)
 Justin [name]. 83 y.o.   Chief Complaint  Patient presents with   Leg Swelling    Patient here for leg swelling with redness, left leg is worse . Patient is not able to walk around, he does prop his legs up but does help. Patient was to start torsemide  but pharmacy did not have in stock, he had an elbow surgery a couple weeks back and the swelling has gotten worse    HISTORY OF PRESENT ILLNESS: Acute problem visit today.  Patient of Dr. Fabio Holts Plotnikov This is a 83 y.o. male here for follow-up of leg swelling and redness Seen by Dr. Georgia Kipper last week. Presently on IV antibiotics for treatment of epidural abscess and septic bursitis of left olecranium. Has been taking furosemide , not Demadex  for leg swelling. Redness is worse Visiting nurse today also told him he may have ventral hernia Accompanied by daughter today who states he has been incontinent of urine.  HPI   Prior to Admission medications   Medication Sig Start Date End Date Taking? Authorizing Provider  acetaminophen  (TYLENOL ) 500 MG tablet Take 1,000 mg by mouth every 6 (six) hours as needed for mild pain (pain score 1-3) or moderate pain (pain score 4-6).   Yes [provider]  amLODipine  (NORVASC ) 5 MG tablet TAKE 1 TABLET BY MOUTH DAILY Patient taking differently: Take 5 mg by mouth at bedtime. 03/21/23  Yes Plotnikov, Oakley Bellman, MD  calcium  carbonate (TUMS EX) 750 MG chewable tablet Chew 1,500 mg by mouth daily as needed for heartburn.   Yes [provider]  carbidopa -levodopa  (SINEMET  IR) 25-100 MG tablet For breakthrough restless leg, you can take 1 tablet at bedtime.  OK to take extra tab as needed during the day. Patient taking differently: Take 1 tablet by mouth at bedtime. 03/22/20  Yes Patel, Donika K, DO  ceFAZolin  (ANCEF ) IVPB Inject 2 g into the vein every 8 (eight) hours. Indication:  MSSA septic joint +  lumbar septic arthritis/epidural abscess First Dose: yes Last Day of Therapy:   12/18/23 Labs - Once weekly:  CBC/D and BMP, Labs - Once weekly: ESR and CRP Method of administration: IV Push or 6g daily as a continuous infusion based on family preference Method of administration may be changed at the discretion of home infusion pharmacist based upon assessment of the patient and/or caregiver's ability to self-administer the medication ordered. 10/26/23 12/19/23 Yes Unk Garb, DO  Cholecalciferol  1000 UNITS tablet Take 3,000 Units by mouth daily.   Yes [provider]  Cyanocobalamin  (VITAMIN B-12) 1000 MCG SUBL DISSOLVE 2 TABLETS IN MOUTH EVERY DAY 05/10/21  Yes Plotnikov, Aleksei V, MD  finasteride  (PROSCAR ) 5 MG tablet TAKE 1 TABLET (5 MG TOTAL) BY MOUTH DAILY. 08/24/23  Yes Plotnikov, Oakley Bellman, MD  gabapentin  (NEURONTIN ) 300 MG capsule Take 1 capsule (300 mg total) by mouth 3 (three) times daily. 10/26/23  Yes Unk Garb, DO  ibuprofen (ADVIL) 200 MG tablet Take 400 mg by mouth every 6 (six) hours as needed for mild pain (pain score 1-3) or moderate pain (pain score 4-6).   Yes [provider]  magnesium  hydroxide (MILK OF MAGNESIA) 400 MG/5ML suspension Take 15 mLs by mouth daily as needed for mild constipation.   Yes [provider]  meclizine  (ANTIVERT ) 12.5 MG tablet TAKE 1 TABLET BY MOUTH 3 TIMES DAILY AS NEEDED FOR DIZZINESS. 10/04/22  Yes Plotnikov, Aleksei V, MD  Multiple Vitamins-Minerals (PRESERVISION AREDS PO) Take 2 tablets by mouth 2 (two) times  daily.   Yes [provider]  ondansetron  (ZOFRAN ) 4 MG tablet Take 1 tablet (4 mg total) by mouth every 6 (six) hours as needed for nausea or vomiting. 10/26/23  Yes Unk Garb, DO  Polyethyl Glycol-Propyl Glycol (SYSTANE OP) Place 1 drop into both eyes 2 (two) times daily as needed (dry eyes). [facility]   Yes [provider]  pravastatin  (PRAVACHOL ) 20 MG tablet TAKE 1 TABLET (20 MG TOTAL) BY MOUTH DAILY. 08/24/23  Yes Plotnikov, Oakley Bellman, MD  rOPINIRole  (REQUIP ) 2 MG tablet TAKE 1  TABLET BY MOUTH AT AT NOON AND 1 TAB AT 4PM AND 1 TAB AT BEDTIME Patient taking differently: Take 1 mg by mouth in the morning, at noon, in the evening, and at bedtime. 10/11/22  Yes Plotnikov, Oakley Bellman, MD  tamsulosin  (FLOMAX ) 0.4 MG CAPS capsule Take 1 capsule (0.4 mg total) by mouth in the morning. 11/21/22  Yes Plotnikov, Oakley Bellman, MD  terazosin  (HYTRIN ) 2 MG capsule TAKE 1 CAPSULE BY MOUTH EVERY NIGHT AT BEDTIME 09/21/23  Yes Plotnikov, Aleksei V, MD  torsemide  (DEMADEX ) 100 MG tablet Take 0.5-1 tablets (50-100 mg total) by mouth daily. 11/07/23  Yes Plotnikov, Aleksei V, MD  Oxycodone  HCl 10 MG TABS Take 1 tablet (10 mg total) by mouth 5 (five) times daily for 5 days. 10/26/23 10/31/23  Unk Garb, DO    Allergies  Allergen Reactions   Levofloxacin Other (See Comments)    REACTION: hands pealed    Patient Active Problem List   Diagnosis Date Noted   Septic bursitis of elbow 11/06/2023   Abscess of epidural space of spine due to bacteria 10/25/2023   Epidural abscess 10/25/2023   MSSA bacteremia 10/22/2023   Hypokalemia 10/21/2023   Acute on chronic pain of left elbow concerning for septic arthritis 10/21/2023   Pseudophakia of both eyes 02/15/2022   MDS (myelodysplastic syndrome) (HCC) 12/25/2021   Abnormal EKG 11/18/2021   Thrombocytopenia (HCC) 11/18/2021   Lumbar spondylosis 07/22/2021   Lumbar radiculopathy 03/24/2021   S/P total knee arthroplasty, right 11/09/2020   Weight loss 08/30/2020   Complication associated with orthopedic device (HCC) 06/10/2020   Diastolic dysfunction 10/27/2019   Exudative age-related macular degeneration of left eye with active choroidal neovascularization (HCC) 10/15/2019   Intermediate stage nonexudative age-related macular degeneration of left eye 10/15/2019   Early stage nonexudative age-related macular degeneration of right eye 10/15/2019   Pseudophakia 10/15/2019   Posterior vitreous detachment of left eye 10/15/2019   GERD  (gastroesophageal reflux disease) 11/13/2018   Coronary artery disease 10/24/2018   Claudication (HCC) 02/22/2018   Anemia 08/28/2017   DOE (dyspnea on exertion) 07/31/2017   Fatigue 07/31/2017   Essential hypertension 11/29/2016   Smoking greater than 30 pack years 02/02/2016   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) 02/28/2013   Ingrowing toenail of left foot 08/29/2012   Allergic rhinitis 10/10/2011   Dupuytren's contracture of hand 07/05/2011   Vitamin B12 deficiency 12/23/2010   BPH (benign prostatic hyperplasia) 12/23/2010   Low back pain 07/27/2009   TOBACCO USE, QUIT 07/27/2009   Diverticulosis of colon 03/28/2009   RLS (restless legs syndrome) 10/28/2008   SKIN RASH, ALLERGIC 10/01/2008   Edema 10/01/2008   Neuropathy 04/22/2008   Vitamin D  deficiency 01/21/2008   INSOMNIA, CHRONIC 12/23/2007   Osteoarthritis 12/23/2007   CAROTID BRUIT, LEFT 12/23/2007    Past Medical History:  Diagnosis Date   Alcoholism (HCC)    Dry 13 years   Basal cell carcinoma  Right Chest   BPH (benign prostatic hyperplasia)    Diverticulosis of colon    Dizzy spells    none recent   DJD (degenerative joint disease)    lower back   Elbow fracture, left 2009   Dr Guyann Leitz   FRACTURE, [place], LEFT 12/23/2007   Qualifier: Diagnosis of   By: Marthe Slain        GERD (gastroesophageal reflux disease)    Glaucoma    History of TIAs 1 yrs ago   Hyperkeratosis    LBP (low back pain)    Macular degeneration    left wet right dry sees dr Seward Dao   Melanoma [facility]) 2009   x3 Dr Amy [place]   Osteoarthritis    Restless leg syndrome    Thrombocytopenia (HCC) 11/18/2021   Tinnitus    Tobacco abuse    Uses walker    Wears glasses    reading   Wears partial dentures    upper    Past Surgical History:  Procedure Laterality Date   CATARACT EXTRACTION Bilateral 2018   HARDWARE REMOVAL Left 07/09/2020   Procedure: Left elbow hardware removal;  Surgeon: Janeth Medicus, MD;  Location: [facility];  Service: Orthopedics;  Laterality: Left;  60 mins   HARDWARE REMOVAL Left 10/23/2023   Procedure: REMOVAL, HARDWARE;  Surgeon: Hardy Lia, MD;  Location: [facility] OR;  Service: Orthopedics;  Laterality: Left;   IRRIGATION AND DEBRIDEMENT ELBOW Left 10/23/2023   Procedure: IRRIGATION AND DEBRIDEMENT ELBOW;  Surgeon: Hardy Lia, MD;  Location: [facility] OR;  Service: Orthopedics;  Laterality: Left;   mda     Dr. Seward Dao wet injection   MELANOMA EXCISION  2009   x3   RECONSTRUCTION MEDIAL COLLATERAL LIGAMENT ELBOW W/ TENDON GRAFT  2009   Left, Dr Guyann Leitz   TONSILLECTOMY  as child   TOTAL KNEE ARTHROPLASTY Right 11/09/2020   Procedure: TOTAL KNEE ARTHROPLASTY;  Surgeon: Claiborne Crew, MD;  Location: [facility] ORS;  Service: Orthopedics;  Laterality: Right;   TRANSESOPHAGEAL ECHOCARDIOGRAM (CATH LAB) N/A 10/24/2023   Procedure: TRANSESOPHAGEAL ECHOCARDIOGRAM;  Surgeon: Hugh Madura, MD;  Location: [facility] INVASIVE CV LAB;  Service: Cardiovascular;  Laterality: N/A;    Social History   Socioeconomic History   Marital status: Married    Spouse name: Renda Carpen   Number of children: 2   Years of education: Not on file   Highest education level: Not on file  Occupational History   Occupation: [place] Veteran - [employer]    Employer: RETIRED   Occupation: Scientist, research (physical sciences)   Occupation: Games developer  Tobacco Use   Smoking status: Former    Current packs/day: 0.00    Average packs/day: 3.0 packs/day for 38.0 years (114.0 ttl pk-yrs)    Types: Cigarettes    Start date: 77    Quit date: 1986    Years since quitting: 39.3   Smokeless tobacco: Never   Tobacco comments:    states he quit May 15 1985  Vaping Use   Vaping status: Never Used  Substance and Sexual Activity   Alcohol  use: No    Comment: PREVIOUS - DRY 44yrs   Drug use: Not Currently   Sexual activity: Not Currently  Other Topics Concern   Not on file  Social History Narrative   Right handed   One story home    Drinks coffee q am      Son and wife lives with him-2025   Social Drivers of  Health   Financial Resource Strain: Low Risk  (07/20/2023)   Overall Financial Resource Strain (CARDIA)    Difficulty of Paying Living Expenses: Not very hard  Food Insecurity: No Food Insecurity (10/21/2023)   Hunger Vital Sign    Worried About Running Out of Food in the Last Year: Never true    Ran Out of Food in the Last Year: Never true  Transportation Needs: No Transportation Needs (10/21/2023)   PRAPARE - Administrator, Civil Service (Medical): No    Lack of Transportation (Non-Medical): No  Physical Activity: Inactive (07/20/2023)   Exercise Vital Sign    Days of Exercise per Week: 0 days    Minutes of Exercise per Session: 0 min  Stress: No Stress Concern Present (07/20/2023)   [employer] of Occupational Health - Occupational Stress Questionnaire    Feeling of Stress : Not at all  Social Connections: Moderately Isolated (10/21/2023)   Social Connection and Isolation Panel [NHANES]    Frequency of Communication with Friends and Family: More than three times a week    Frequency of Social Gatherings with Friends and Family: Three times a week    Attends Religious Services: Never    Active Member of Clubs or Organizations: No    Attends Banker Meetings: Never    Marital Status: Married  Catering manager Violence: Not At Risk (10/21/2023)   Humiliation, Afraid, Rape, and Kick questionnaire    Fear of Current or Ex-Partner: No    Emotionally Abused: No    Physically Abused: No    Sexually Abused: No    Family History  Problem Relation Age of Onset   Cancer Mother        ?breast   Cancer Father        Lung     Review of Systems  Constitutional: Negative.  Negative for chills and fever.  HENT: Negative.  Negative for congestion and sore throat.   Respiratory: Negative.  Negative for cough and shortness of breath.   Cardiovascular: Negative.  Negative for chest  pain and palpitations.  Gastrointestinal:  Negative for abdominal pain, diarrhea, nausea and vomiting.  Genitourinary: Negative.  Negative for dysuria and hematuria.  Skin:  Positive for rash.  Neurological: Negative.  Negative for dizziness and headaches.  All other systems reviewed and are negative.   Today's Vitals   11/12/23 1336  BP: 122/74  Pulse: 62  Temp: 98.3 F (36.8 C)  TempSrc: Oral  SpO2: 97%  Height: 5\' 9"  (1.753 m)   Body mass index is 24.84 kg/m.   Physical Exam Vitals reviewed.  Constitutional:      Appearance: Normal appearance.  HENT:     Head: Normocephalic.  Eyes:     Extraocular Movements: Extraocular movements intact.  Cardiovascular:     Rate and Rhythm: Normal rate and regular rhythm.     Pulses: Normal pulses.     Heart sounds: Normal heart sounds.  Pulmonary:     Effort: Pulmonary effort is normal.     Breath sounds: Normal breath sounds.  Musculoskeletal:     Cervical back: No tenderness.     Right lower leg: Edema present.     Left lower leg: Edema present.     Comments: Well-demarcated intensely erythematous rash to lower extremities left more than right compatible with erysipelas  Skin:    Findings: Rash present.  Neurological:     Mental Status: He is alert and oriented to person, place, and time.  Psychiatric:        Mood and Affect: Mood normal.        Behavior: Behavior normal.      ASSESSMENT & PLAN: A total of 42 minutes was spent with the patient and counseling/coordination of care regarding preparing for this visit, review of most recent office visit notes, review of multiple chronic medical conditions and their management, diagnosis of erysipelas on treatment with antibiotics, review of all medications, review of most recent bloodwork results, review of health maintenance items, education on nutrition, prognosis, documentation, and need for follow up.   Problem List Items Addressed This Visit       Cardiovascular and  Mediastinum   Essential hypertension   BP Readings from Last 3 Encounters:  11/12/23 122/74  11/02/23 123/87  10/26/23 (!) 159/67  Well-controlled hypertension Continue amlodipine  5 mg daily and Hytrin  2 mg at bedtime         Nervous and Auditory   Abscess of epidural space of spine due to bacteria   lumbar epidural abscess L4-S1    On Cefazolin  2g IV tid at home        Musculoskeletal and Integument   Erysipelas of both lower extremities - Primary   Persistent skin infection despite IV cephalosporin Recommend MRSA coverage with doxycycline  100 mg twice a day Clinically stable.  Not septic. ED precautions given Persistent lower extremity edema. Edema management discussed Has been taking furosemide  instead of Demadex       Relevant Medications   doxycycline  (VIBRA -TABS) 100 MG tablet     Other   MDS (myelodysplastic syndrome) (HCC)   S/p bone marrow bx F/u w/Dr Rosaline Coma 08/2023 Labs w/Dr Rosaline Coma New decrease in GFR 49; LLE>RLE edema. Now his L hand is swollen, bruised (no DVT). On biweekly Retacrit   inj for MDS. Probably Retacrit  side effect vs other. F/u w/Dr Rosaline Coma RLE 1+, LLE 2+ edema L hand w/edema Hydrate well      Relevant Medications   doxycycline  (VIBRA -TABS) 100 MG tablet   MSSA bacteremia   On IV Cefazolin  at home       Urinary incontinence   Recent onset.  Needs urology evaluation. Referral placed today      Relevant Orders   Ambulatory referral to Urology   Patient Instructions  Erysipelas  Erysipelas is an infection that affects the skin and tissues near the surface of the skin. It causes the skin to become red, swollen, and painful. The infection is most common on the legs but may also affect other areas, such as the face. With treatment, the infection usually goes away in a few days. If not treated, the infection can spread or lead to other problems, such as collections of pus (abscesses). What are the causes? This condition is caused by  bacteria. Most often, it is caused by bacteria called streptococci.  Bacteria may get into the skin through a break in the skin, such as: A cut or scrape. An incision from surgery. A burn. An insect bite. An open sore. A crack in the skin. Sometimes, it is not known how the bacteria infected the skin. What increases the risk? You are more likely to develop this condition if you: Are a young child. Are an older adult. Have a weakened disease-fighting system (immune system), such as having HIV or AIDS. Have diabetes. Drink a lot of alcohol . Had recent surgery. Have a yeast infection of the skin. Have swollen legs. What are the signs or symptoms? The infection causes a reddened area  on the skin. This reddened area may: Be painful and swollen. Have a clear border around it. Feel itchy and hot. Develop blisters or abscesses. Other symptoms may include: Fever. Chills. Nausea and vomiting. Swollen glands (lymph nodes), such as in the neck. Headache. Feeling tired (fatigue). Loss of appetite. How is this diagnosed? This condition is diagnosed based on: A physical exam. Your health care provider will examine your skin closely. Your symptoms and medical history. How is this treated? This condition is treated with antibiotic medicine. Symptoms usually get better within a few days after starting antibiotics. Follow these instructions at home: Medicines Take other over-the-counter and prescription medicines only as told by your health care provider. Take your antibiotic medicine as told by your health care provider. Do not stop taking the antibiotic even if your condition starts to improve. Ask your health care provider if it is safe for you to take medicines for pain as needed, such as acetaminophen  or ibuprofen. General instructions  If the affected area is on an arm or leg, raise (elevate) the affected arm or leg above the level of your heart while you are sitting or lying  down. Do not put any creams or lotions on the affected area of your skin unless your health care provider tells you to do that. Do not share bedding, towels, or washcloths (linens) with other people. Use only your own linens to prevent the infection from spreading to others. Follow instructions from your health care provider about how to take care of your wound. Make sure you: Wash your hands with soap and water  for at least 20 seconds before and after you change your bandage (dressing). If soap and water  are not available, use hand sanitizer. Change your dressing as told by your health care provider. Keep all follow-up visits. This is important. Contact a health care provider if: Your symptoms do not improve within 1-2 days of starting treatment. You develop new symptoms. You have a fever. You feel generally sick (malaise) with muscle aches and pains. Get help right away if: Your symptoms get worse. You develop vomiting or diarrhea that does not go away. Your red area gets larger or turns dark in color. You notice red streaks coming from the infected area. Summary Erysipelas is an infection affecting the skin and tissues near the surface of the skin. It causes the skin to become red, swollen, and painful. This condition is caused by bacteria. Most often, it is caused by bacteria called streptococci. Bacteria may enter through a break in the skin. Sometimes, it is not known how the bacteria infected the skin. This condition is treated with antibiotic medicine. Symptoms usually get better within a few days after starting antibiotics. This information is not intended to replace advice given to you by your health care provider. Make sure you discuss any questions you have with your health care provider. Document Revised: 05/03/2023 Document Reviewed: 05/03/2023 Elsevier Patient Education  2024 Elsevier Inc.    Maryagnes Small, MD [facility]

## 2023-11-12 NOTE — Assessment & Plan Note (Signed)
 S/p bone marrow bx F/u w/Dr Rosaline Coma 08/2023 Labs w/Dr Rosaline Coma New decrease in GFR 49; LLE>RLE edema. Now his L hand is swollen, bruised (no DVT). On biweekly Retacrit   inj for MDS. Probably Retacrit  side effect vs other. F/u w/Dr Rosaline Coma RLE 1+, LLE 2+ edema L hand w/edema Hydrate well

## 2023-11-12 NOTE — Assessment & Plan Note (Signed)
 BP Readings from Last 3 Encounters:  11/12/23 122/74  11/02/23 123/87  10/26/23 (!) 159/67  Well-controlled hypertension Continue amlodipine  5 mg daily and Hytrin  2 mg at bedtime

## 2023-11-12 NOTE — Assessment & Plan Note (Signed)
 lumbar epidural abscess L4-S1   On Cefazolin  2g IV tid at home

## 2023-11-12 NOTE — Telephone Encounter (Signed)
 Copied from CRM [id number]. Topic: Clinical - Home Health Verbal Orders >> Nov 12, 2023  9:38 AM Winnifred Havers wrote: Caller/Agency:[facility]  Callback Number: [phone] Service Requested: Skilled Nursing Frequ Any new concerns about the patient? Yes called to inform pt's creatine in high. 2.38

## 2023-11-12 NOTE — Assessment & Plan Note (Signed)
 Recent onset.  Needs urology evaluation. Referral placed today

## 2023-11-12 NOTE — Assessment & Plan Note (Signed)
 Persistent skin infection despite IV cephalosporin Recommend MRSA coverage with doxycycline  100 mg twice a day Clinically stable.  Not septic. ED precautions given Persistent lower extremity edema. Edema management discussed Has been taking furosemide  instead of Demadex 

## 2023-11-12 NOTE — Assessment & Plan Note (Signed)
 On IV Cefazolin  at home

## 2023-11-12 NOTE — Patient Instructions (Signed)
 Erysipelas  Erysipelas is an infection that affects the skin and tissues near the surface of the skin. It causes the skin to become red, swollen, and painful. The infection is most common on the legs but may also affect other areas, such as the face. With treatment, the infection usually goes away in a few days. If not treated, the infection can spread or lead to other problems, such as collections of pus (abscesses). What are the causes? This condition is caused by bacteria. Most often, it is caused by bacteria called streptococci.  Bacteria may get into the skin through a break in the skin, such as: A cut or scrape. An incision from surgery. A burn. An insect bite. An open sore. A crack in the skin. Sometimes, it is not known how the bacteria infected the skin. What increases the risk? You are more likely to develop this condition if you: Are a young child. Are an older adult. Have a weakened disease-fighting system (immune system), such as having HIV or AIDS. Have diabetes. Drink a lot of alcohol . Had recent surgery. Have a yeast infection of the skin. Have swollen legs. What are the signs or symptoms? The infection causes a reddened area on the skin. This reddened area may: Be painful and swollen. Have a clear border around it. Feel itchy and hot. Develop blisters or abscesses. Other symptoms may include: Fever. Chills. Nausea and vomiting. Swollen glands (lymph nodes), such as in the neck. Headache. Feeling tired (fatigue). Loss of appetite. How is this diagnosed? This condition is diagnosed based on: A physical exam. Your health care provider will examine your skin closely. Your symptoms and medical history. How is this treated? This condition is treated with antibiotic medicine. Symptoms usually get better within a few days after starting antibiotics. Follow these instructions at home: Medicines Take other over-the-counter and prescription medicines only as told by  your health care provider. Take your antibiotic medicine as told by your health care provider. Do not stop taking the antibiotic even if your condition starts to improve. Ask your health care provider if it is safe for you to take medicines for pain as needed, such as acetaminophen  or ibuprofen. General instructions  If the affected area is on an arm or leg, raise (elevate) the affected arm or leg above the level of your heart while you are sitting or lying down. Do not put any creams or lotions on the affected area of your skin unless your health care provider tells you to do that. Do not share bedding, towels, or washcloths (linens) with other people. Use only your own linens to prevent the infection from spreading to others. Follow instructions from your health care provider about how to take care of your wound. Make sure you: Wash your hands with soap and water  for at least 20 seconds before and after you change your bandage (dressing). If soap and water  are not available, use hand sanitizer. Change your dressing as told by your health care provider. Keep all follow-up visits. This is important. Contact a health care provider if: Your symptoms do not improve within 1-2 days of starting treatment. You develop new symptoms. You have a fever. You feel generally sick (malaise) with muscle aches and pains. Get help right away if: Your symptoms get worse. You develop vomiting or diarrhea that does not go away. Your red area gets larger or turns dark in color. You notice red streaks coming from the infected area. Summary Erysipelas is an infection affecting  the skin and tissues near the surface of the skin. It causes the skin to become red, swollen, and painful. This condition is caused by bacteria. Most often, it is caused by bacteria called streptococci. Bacteria may enter through a break in the skin. Sometimes, it is not known how the bacteria infected the skin. This condition is treated  with antibiotic medicine. Symptoms usually get better within a few days after starting antibiotics. This information is not intended to replace advice given to you by your health care provider. Make sure you discuss any questions you have with your health care provider. Document Revised: 05/03/2023 Document Reviewed: 05/03/2023 Elsevier Patient Education  2024 [employer].

## 2023-11-12 NOTE — Telephone Encounter (Signed)
 Chief Complaint: leg swelling Symptoms: bilateral lower leg swelling and weeping, worse on the left leg Frequency: x 3 weeks Pertinent Negatives: Patient denies fever, chest pain, difficulty breathing. Disposition: [] ED /[] Urgent Care (no appt availability in office) / [x] Appointment(In office/virtual)/ []  [facility]/ [] Home Care/ [] Refused Recommended Disposition /[] [facility]/ []  Follow-up with PCP Additional Notes: Daughter, [name], calling in for triage. She states she called the office this weekend and spoke with a triage RN who advised ED and she did not take him. Patient seen on 11/06/23 with PCP.  She states the torsemide  has been unable to be filled due to the pharmacy was back ordered, she states she had the prescription transferred and is going to call [facility] to get him started on the medication today. No available appts with PCP, scheduled with available provider.  Copied from CRM [id number]. Topic: Clinical - Red Word Triage >> Nov 12, 2023  7:48 AM Allyne Areola wrote: Red Word that prompted transfer to Nurse Triage: Patient's daughter [name] is calling to advise that patient is experiencing swelling in both legs, left leg is a bit worse releasing fluids and turning a red color. Reason for Disposition  SEVERE leg swelling (e.g., swelling extends above knee, entire leg is swollen, weeping fluid)  Answer Assessment - Initial Assessment Questions 1. ONSET: "When did the swelling start?" (e.g., minutes, hours, days)     X 3 weeks.  2. LOCATION: "What part of the leg is swollen?"  "Are both legs swollen or just one leg?"     Bilateral legs, worse on left leg. Mid lower leg/shin to foot.  3. SEVERITY: "How bad is the swelling?" (e.g., localized; mild, moderate, severe)   - Localized: Small area of swelling localized to one leg.   - MILD pedal edema: Swelling limited to foot and ankle, pitting edema < 1/4 inch (6 mm) deep, rest and elevation eliminate most or all  swelling.   - MODERATE edema: Swelling of lower leg to knee, pitting edema > 1/4 inch (6 mm) deep, rest and elevation only partially reduce swelling.   - SEVERE edema: Swelling extends above knee, facial or hand swelling present.      Moderate.  4. REDNESS: "Does the swelling look red or infected?"     Redness, weeping clear liquid.  5. PAIN: "Is the swelling painful to touch?" If Yes, ask: "How painful is it?"   (Scale 1-10; mild, moderate or severe)     She states tender or sensitive to touch but denies pain.  6. FEVER: "Do you have a fever?" If Yes, ask: "What is it, how was it measured, and when did it start?"      Denies.  7. CAUSE: "What do you think is causing the leg swelling?"     Unsure.  8. MEDICAL HISTORY: "Do you have a history of blood clots (e.g., DVT), cancer, heart failure, kidney disease, or liver failure?"     No. Daughter mentioned his kidney function recently was low/decreased.  9. RECURRENT SYMPTOM: "Have you had leg swelling before?" If Yes, ask: "When was the last time?" "What happened that time?"     Yes, she states he has had leg swelling before but not to this extent. Patient was prescribed torsemide  recently and was unable to get it filled.  10. OTHER SYMPTOMS: "Do you have any other symptoms?" (e.g., chest pain, difficulty breathing)       Denies.  11. PREGNANCY: "Is there any chance you are pregnant?" "When  was your last menstrual period?"       N/A.  Protocols used: Leg Swelling and Edema-A-AH

## 2023-11-13 DIAGNOSIS — H43812 Vitreous degeneration, left eye: Secondary | ICD-10-CM | POA: Diagnosis not present

## 2023-11-13 DIAGNOSIS — H401130 Primary open-angle glaucoma, bilateral, stage unspecified: Secondary | ICD-10-CM | POA: Diagnosis not present

## 2023-11-13 DIAGNOSIS — H353111 Nonexudative age-related macular degeneration, right eye, early dry stage: Secondary | ICD-10-CM | POA: Diagnosis not present

## 2023-11-13 DIAGNOSIS — H353221 Exudative age-related macular degeneration, left eye, with active choroidal neovascularization: Secondary | ICD-10-CM | POA: Diagnosis not present

## 2023-11-14 DIAGNOSIS — G061 Intraspinal abscess and granuloma: Secondary | ICD-10-CM | POA: Diagnosis not present

## 2023-11-14 NOTE — Telephone Encounter (Signed)
What do I need to do?  Thank you 

## 2023-11-15 ENCOUNTER — Inpatient Hospital Stay

## 2023-11-15 ENCOUNTER — Ambulatory Visit: Admitting: Infectious Diseases

## 2023-11-15 ENCOUNTER — Inpatient Hospital Stay: Admitting: Infectious Diseases

## 2023-11-15 ENCOUNTER — Ambulatory Visit (HOSPITAL_BASED_OUTPATIENT_CLINIC_OR_DEPARTMENT_OTHER)
Admission: RE | Admit: 2023-11-15 | Discharge: 2023-11-15 | Disposition: A | Discharge status: Home / Self Care | Source: Ambulatory Visit | Attending: Infectious Diseases | Admitting: Infectious Diseases

## 2023-11-15 ENCOUNTER — Other Ambulatory Visit: Payer: Self-pay

## 2023-11-15 VITALS — BP 130/65 | HR 59 | Temp 98.6°F

## 2023-11-15 VITALS — BP 109/66 | HR 86 | Temp 98.2°F

## 2023-11-15 DIAGNOSIS — M00022 Staphylococcal arthritis, left elbow: Secondary | ICD-10-CM

## 2023-11-15 DIAGNOSIS — M7989 Other specified soft tissue disorders: Secondary | ICD-10-CM

## 2023-11-15 DIAGNOSIS — Z79899 Other long term (current) drug therapy: Secondary | ICD-10-CM

## 2023-11-15 DIAGNOSIS — M71122 Other infective bursitis, left elbow: Secondary | ICD-10-CM | POA: Diagnosis not present

## 2023-11-15 DIAGNOSIS — Z452 Encounter for adjustment and management of vascular access device: Secondary | ICD-10-CM

## 2023-11-15 DIAGNOSIS — B9561 Methicillin susceptible Staphylococcus aureus infection as the cause of diseases classified elsewhere: Secondary | ICD-10-CM

## 2023-11-15 DIAGNOSIS — R7881 Bacteremia: Secondary | ICD-10-CM | POA: Diagnosis not present

## 2023-11-15 DIAGNOSIS — D469 Myelodysplastic syndrome, unspecified: Secondary | ICD-10-CM

## 2023-11-15 DIAGNOSIS — G062 Extradural and subdural abscess, unspecified: Secondary | ICD-10-CM

## 2023-11-15 DIAGNOSIS — D4620 Refractory anemia with excess of blasts, unspecified: Secondary | ICD-10-CM | POA: Diagnosis not present

## 2023-11-15 DIAGNOSIS — R600 Localized edema: Secondary | ICD-10-CM | POA: Diagnosis not present

## 2023-11-15 LAB — CMP (CANCER CENTER ONLY)
ALT: <5 U/L (ref 0–44)
AST: 15 U/L (ref 15–41)
Albumin: 3.2 g/dL — ABNORMAL LOW (ref 3.5–5.0)
Alkaline Phosphatase: 78 U/L (ref 38–126)
Anion gap: 7 (ref 5–15)
BUN: 30 mg/dL — ABNORMAL HIGH (ref 8–23)
CO2: 32 mmol/L (ref 22–32)
Calcium: 8.3 mg/dL — ABNORMAL LOW (ref 8.9–10.3)
Chloride: 104 mmol/L (ref 98–111)
Creatinine: 1.32 mg/dL — ABNORMAL HIGH (ref 0.61–1.24)
GFR, Estimated: 53 mL/min — ABNORMAL LOW (ref >60)
Glucose, Bld: 107 mg/dL — ABNORMAL HIGH (ref 70–99)
Potassium: 3.8 mmol/L (ref 3.5–5.1)
Sodium: 143 mmol/L (ref 135–145)
Total Bilirubin: 0.5 mg/dL (ref 0.0–1.2)
Total Protein: 6.5 g/dL (ref 6.5–8.1)

## 2023-11-15 LAB — CBC WITH DIFFERENTIAL (CANCER CENTER ONLY)
Abs Immature Granulocytes: 0.02 10*3/uL (ref 0.00–0.07)
Basophils Absolute: 0 10*3/uL (ref 0.0–0.1)
Basophils Relative: 0 %
Eosinophils Absolute: 0 10*3/uL (ref 0.0–0.5)
Eosinophils Relative: 1 %
HCT: 26.2 % — ABNORMAL LOW (ref 39.0–52.0)
Hemoglobin: 8 g/dL — ABNORMAL LOW (ref 13.0–17.0)
Immature Granulocytes: 1 %
Lymphocytes Relative: 41 %
Lymphs Abs: 1 10*3/uL (ref 0.7–4.0)
MCH: 24.5 pg — ABNORMAL LOW (ref 26.0–34.0)
MCHC: 30.5 g/dL (ref 30.0–36.0)
MCV: 80.1 fL (ref 80.0–100.0)
Monocytes Absolute: 0.5 10*3/uL (ref 0.1–1.0)
Monocytes Relative: 21 %
Neutro Abs: 0.9 10*3/uL — ABNORMAL LOW (ref 1.7–7.7)
Neutrophils Relative %: 36 %
Platelet Count: 50 10*3/uL — ABNORMAL LOW (ref 150–400)
RBC: 3.27 MIL/uL — ABNORMAL LOW (ref 4.22–5.81)
RDW: 21.1 % — ABNORMAL HIGH (ref 11.5–15.5)
WBC Count: 2.4 10*3/uL — ABNORMAL LOW (ref 4.0–10.5)
nRBC: 0 % (ref 0.0–0.2)

## 2023-11-15 MED ORDER — EPOETIN ALFA 20000 UNIT/ML IJ SOLN
20000.0000 [IU] | Freq: Once | INTRAMUSCULAR | Status: AC
  Administered 2023-11-15: 20000 [IU] via SUBCUTANEOUS
  Filled 2023-11-15: qty 1

## 2023-11-15 NOTE — Progress Notes (Addendum)
 Patient Active Problem List   Diagnosis Date Noted   Erysipelas of both lower extremities 11/12/2023   Urinary incontinence 11/12/2023   Septic bursitis of elbow 11/06/2023   Abscess of epidural space of spine due to bacteria 10/25/2023   Epidural abscess 10/25/2023   MSSA bacteremia 10/22/2023   Hypokalemia 10/21/2023   Acute on chronic pain of left elbow concerning for septic arthritis 10/21/2023   Pseudophakia of both eyes 02/15/2022   MDS (myelodysplastic syndrome) (HCC) 12/25/2021   Abnormal EKG 11/18/2021   Thrombocytopenia (HCC) 11/18/2021   Lumbar spondylosis 07/22/2021   Lumbar radiculopathy 03/24/2021   S/P total knee arthroplasty, right 11/09/2020   Weight loss 08/30/2020   Complication associated with orthopedic device (HCC) 06/10/2020   Diastolic dysfunction 10/27/2019   Exudative age-related macular degeneration of left eye with active choroidal neovascularization (HCC) 10/15/2019   Intermediate stage nonexudative age-related macular degeneration of left eye 10/15/2019   Early stage nonexudative age-related macular degeneration of right eye 10/15/2019   Pseudophakia 10/15/2019   Posterior vitreous detachment of left eye 10/15/2019   GERD (gastroesophageal reflux disease) 11/13/2018   Coronary artery disease 10/24/2018   Claudication (HCC) 02/22/2018   Anemia 08/28/2017   DOE (dyspnea on exertion) 07/31/2017   Fatigue 07/31/2017   Essential hypertension 11/29/2016   Smoking greater than 30 pack years 02/02/2016   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) 02/28/2013   Ingrowing toenail of left foot 08/29/2012   Allergic rhinitis 10/10/2011   Dupuytren's contracture of hand 07/05/2011   Vitamin B12 deficiency 12/23/2010   BPH (benign prostatic hyperplasia) 12/23/2010   Low back pain 07/27/2009   TOBACCO USE, QUIT 07/27/2009   Diverticulosis of colon 03/28/2009   RLS (restless legs syndrome) 10/28/2008   SKIN RASH, ALLERGIC 10/01/2008   Edema 10/01/2008   Neuropathy  04/22/2008   Vitamin D  deficiency 01/21/2008   INSOMNIA, CHRONIC 12/23/2007   Osteoarthritis 12/23/2007   CAROTID BRUIT, LEFT 12/23/2007    Patient's Medications  New Prescriptions   No medications on file  Previous Medications   ACETAMINOPHEN  (TYLENOL ) 500 MG TABLET    Take 1,000 mg by mouth every 6 (six) hours as needed for mild pain (pain score 1-3) or moderate pain (pain score 4-6).   AMLODIPINE  (NORVASC ) 5 MG TABLET    TAKE 1 TABLET BY MOUTH DAILY   CALCIUM  CARBONATE (TUMS EX) 750 MG CHEWABLE TABLET    Chew 1,500 mg by mouth daily as needed for heartburn.   CARBIDOPA -LEVODOPA  (SINEMET  IR) 25-100 MG TABLET    For breakthrough restless leg, you can take 1 tablet at bedtime.  OK to take extra tab as needed during the day.   CEFAZOLIN  (ANCEF ) IVPB    Inject 2 g into the vein every 8 (eight) hours. Indication:  MSSA septic joint +  lumbar septic arthritis/epidural abscess First Dose: yes Last Day of Therapy:  12/18/23 Labs - Once weekly:  CBC/D and BMP, Labs - Once weekly: ESR and CRP Method of administration: IV Push or 6g daily as a continuous infusion based on family preference Method of administration may be changed at the discretion of home infusion pharmacist based upon assessment of the patient and/or caregiver's ability to self-administer the medication ordered.   CHOLECALCIFEROL  1000 UNITS TABLET    Take 3,000 Units by mouth daily.   CYANOCOBALAMIN  (VITAMIN B-12) 1000 MCG SUBL    DISSOLVE 2 TABLETS IN MOUTH EVERY DAY   DOXYCYCLINE  (VIBRA -TABS) 100 MG TABLET    Take 1 tablet (100 mg total)  by mouth 2 (two) times daily for 7 days.   FINASTERIDE  (PROSCAR ) 5 MG TABLET    TAKE 1 TABLET (5 MG TOTAL) BY MOUTH DAILY.   GABAPENTIN  (NEURONTIN ) 300 MG CAPSULE    Take 1 capsule (300 mg total) by mouth 3 (three) times daily.   IBUPROFEN (ADVIL) 200 MG TABLET    Take 400 mg by mouth every 6 (six) hours as needed for mild pain (pain score 1-3) or moderate pain (pain score 4-6).   MAGNESIUM   HYDROXIDE (MILK OF MAGNESIA) 400 MG/5ML SUSPENSION    Take 15 mLs by mouth daily as needed for mild constipation.   MECLIZINE  (ANTIVERT ) 12.5 MG TABLET    TAKE 1 TABLET BY MOUTH 3 TIMES DAILY AS NEEDED FOR DIZZINESS.   MULTIPLE VITAMINS-MINERALS (PRESERVISION AREDS PO)    Take 2 tablets by mouth 2 (two) times daily.   ONDANSETRON  (ZOFRAN ) 4 MG TABLET    Take 1 tablet (4 mg total) by mouth every 6 (six) hours as needed for nausea or vomiting.   OXYCODONE  HCL 10 MG TABS    Take 1 tablet (10 mg total) by mouth 5 (five) times daily for 5 days.   POLYETHYL GLYCOL-PROPYL GLYCOL (SYSTANE OP)    Place 1 drop into both eyes 2 (two) times daily as needed (dry eyes). [facility]   PRAVASTATIN  (PRAVACHOL ) 20 MG TABLET    TAKE 1 TABLET (20 MG TOTAL) BY MOUTH DAILY.   ROPINIROLE  (REQUIP ) 2 MG TABLET    TAKE 1 TABLET BY MOUTH AT AT NOON AND 1 TAB AT 4PM AND 1 TAB AT BEDTIME   TAMSULOSIN  (FLOMAX ) 0.4 MG CAPS CAPSULE    Take 1 capsule (0.4 mg total) by mouth in the morning.   TERAZOSIN  (HYTRIN ) 2 MG CAPSULE    TAKE 1 CAPSULE BY MOUTH EVERY NIGHT AT BEDTIME   TORSEMIDE  (DEMADEX ) 100 MG TABLET    Take 0.5-1 tablets (50-100 mg total) by mouth daily.  Modified Medications   No medications on file  Discontinued Medications   No medications on file    Subjective: Discussed the use of AI scribe software for clinical note transcription with the patient, who gave verbal consent to proceed.   83 year old male with prior history of HTN, CAD, MDS with thrombocytopenia, depression BCC, BPH, melanoma, GERD, DDD, OA, TIA, h/o Tobacco/alcohol  abuse who is here for HFU 4/20-4/25 for left elbow septic bursitis and lumbar epidural abscess.  Patient had an open fracture of his left elbow in 2007. Approximately 4 years ago one of the pins came loose and it was taken out and he had limited range of motion in his left elbow with inability to fully flex and extend and unable to function with his left hand. Left Elbow x-ray as below. 4/20  left elbow SF Gram stain with GPC. Cx with MSSA.  4/20 blood culture with MSSA.  4/22 underwent I&D with removal of hardware, 1 screw remaining ( no OP note available). OR cx with MSSA and MSSE. TTE and TEE negative for vegetations or endocarditis.  MRI L spine septic arthritis of left L5-S1.  Left dorsal epidural abscess extending from L4-S1, phlegmonous change in the left psoas.  No surgical intervention per neurosurgery.  Also required PRBC transfusion for anemia. Discharged on 4/25 to complete 8 weeks course of IV cefazolin  through 6/17  5/15 Patient is accompanied by his daughter surgery.  Getting IV antibiotics through PICC without any concerns related to antibiotics or PICC.  Cefazolin  was recently  reduced to 1 g every 12 dosing due to elevated kidney function.  Daughter reports increased swelling and redness in his left leg compared to right leg for the last few days history of chronic intermittent swelling of bilateral legs.  Seen by PCP on Monday and was started on a 7-day course of doxycycline .  Patient is also on diuretic. Saw Dr. Guyann Leitz yesterday and discharged from his care as left elbow has healed. Also saw Dr Larrie Po post discharge and, wants to repeat MRI end of month to see if improvement and needs for +/- surgery.  Review of Systems: All systems reviewed and negative except as above.  Denies fever, chills, sweats.  Denies nausea, vomiting or diarrhea.  No concerns in left elbow or back  Past Medical History:  Diagnosis Date   Alcoholism (HCC)    Dry 13 years   Basal cell carcinoma    Right Chest   BPH (benign prostatic hyperplasia)    Diverticulosis of colon    Dizzy spells    none recent   DJD (degenerative joint disease)    lower back   Elbow fracture, left 2009   Dr Guyann Leitz   FRACTURE, [place], LEFT 12/23/2007   Qualifier: Diagnosis of   By: Marthe Slain        GERD (gastroesophageal reflux disease)    Glaucoma    History of TIAs 1 yrs ago   Hyperkeratosis    LBP (low  back pain)    Macular degeneration    left wet right dry sees dr Seward Dao   Melanoma [facility]) 2009   x3 Dr Amy [place]   Osteoarthritis    Restless leg syndrome    Thrombocytopenia (HCC) 11/18/2021   Tinnitus    Tobacco abuse    Uses walker    Wears glasses    reading   Wears partial dentures    upper   Past Surgical History:  Procedure Laterality Date   CATARACT EXTRACTION Bilateral 2018   HARDWARE REMOVAL Left 07/09/2020   Procedure: Left elbow hardware removal;  Surgeon: Janeth Medicus, MD;  Location: [facility];  Service: Orthopedics;  Laterality: Left;  60 mins   HARDWARE REMOVAL Left 10/23/2023   Procedure: REMOVAL, HARDWARE;  Surgeon: Hardy Lia, MD;  Location: [facility] OR;  Service: Orthopedics;  Laterality: Left;   IRRIGATION AND DEBRIDEMENT ELBOW Left 10/23/2023   Procedure: IRRIGATION AND DEBRIDEMENT ELBOW;  Surgeon: Hardy Lia, MD;  Location: [facility] OR;  Service: Orthopedics;  Laterality: Left;   mda     Dr. Seward Dao wet injection   MELANOMA EXCISION  2009   x3   RECONSTRUCTION MEDIAL COLLATERAL LIGAMENT ELBOW W/ TENDON GRAFT  2009   Left, Dr Guyann Leitz   TONSILLECTOMY  as child   TOTAL KNEE ARTHROPLASTY Right 11/09/2020   Procedure: TOTAL KNEE ARTHROPLASTY;  Surgeon: Claiborne Crew, MD;  Location: [facility] ORS;  Service: Orthopedics;  Laterality: Right;   TRANSESOPHAGEAL ECHOCARDIOGRAM (CATH LAB) N/A 10/24/2023   Procedure: TRANSESOPHAGEAL ECHOCARDIOGRAM;  Surgeon: Hugh Madura, MD;  Location: [facility] INVASIVE CV LAB;  Service: Cardiovascular;  Laterality: N/A;    Social History   Tobacco Use   Smoking status: Former    Current packs/day: 0.00    Average packs/day: 3.0 packs/day for 38.0 years (114.0 ttl pk-yrs)    Types: Cigarettes    Start date: 27    Quit date: 51    Years since quitting: 39.3   Smokeless tobacco: Never   Tobacco comments:  states he quit May 15 1985  Vaping Use   Vaping status: Never Used  Substance Use Topics   Alcohol  use: No     Comment: PREVIOUS - DRY 48yrs   Drug use: Not Currently    Family History  Problem Relation Age of Onset   Cancer Mother        ?breast   Cancer Father        Lung    Allergies  Allergen Reactions   Levofloxacin Other (See Comments)    REACTION: hands pealed    Health Maintenance  Topic Date Due   COVID-19 Vaccine (4 - 2024-25 season) 03/04/2023   INFLUENZA VACCINE  02/01/2024   Medicare Annual Wellness (AWV)  07/19/2024   DTaP/Tdap/Td (3 - Td or Tdap) 05/24/2032   Pneumonia Vaccine 41+ Years old  Completed   HPV VACCINES  Aged Out   Meningococcal B Vaccine  Aged Out   Zoster Vaccines- Shingrix  Discontinued    Objective:  Vitals:   11/15/23 1409  BP: 109/66  Pulse: 86  Temp: 98.2 F (36.8 C)  TempSrc: Oral  SpO2: 95%   There is no height or weight on file to calculate BMI.  Physical Exam Constitutional:      Appearance: Normal appearance.  HENT:     Head: Normocephalic and atraumatic.      Mouth: Mucous membranes are moist.  Eyes:    Conjunctiva/sclera: Conjunctivae normal.     Pupils: Pupils are equal, round, and b/l symmetrical    Cardiovascular:     Rate and Rhythm: Normal rate and regular rhythm.     Heart sounds: s1s2  Pulmonary:     Effort: Pulmonary effort is normal.     Breath sounds: Normal breath sounds.   Abdominal:     General: Non distended     Palpations: soft.   Musculoskeletal:        General: sitting in the wheelchair. Daughter reports he has been weak to walk independently since last admission. Left elbow wound has healed with no signs of infection. NO wound in the back.     Swelling in the b/l lower extremities with left more red than right, however no warmth, swelling or tenderness or crepitus or fluctuance. Chronic skin changes s/o chronic swelling of legs. Distal NVS intact    Skin:    General: Skin is warm and dry. Rt TKA with no signs of infection    Comments: PICC OK with no signs of infection  Neurological:      General: grossly non focal     Mental Status: awake, alert and oriented to person, place, and time.   Psychiatric:        Mood and Affect: Mood normal.   Lab Results Lab Results  Component Value Date   WBC 2.4 (L) 11/15/2023   HGB 8.0 (L) 11/15/2023   HCT 26.2 (L) 11/15/2023   MCV 80.1 11/15/2023   PLT 50 (L) 11/15/2023    Lab Results  Component Value Date   CREATININE 1.32 (H) 11/15/2023   BUN 30 (H) 11/15/2023   NA 143 11/15/2023   K 3.8 11/15/2023   CL 104 11/15/2023   CO2 32 11/15/2023    Lab Results  Component Value Date   ALT <5 11/15/2023   AST 15 11/15/2023   ALKPHOS 78 11/15/2023   BILITOT 0.5 11/15/2023    Lab Results  Component Value Date   CHOL 136 01/12/2017   HDL 54.70 01/12/2017   LDLCALC 68  01/12/2017   TRIG 67.0 01/12/2017   CHOLHDL 2 01/12/2017   No results found for: "LABRPR", "RPRTITER" No results found for: "HIV1RNAQUANT", "HIV1RNAVL", "CD4TABS"   Microbiology Results for orders placed or performed during the hospital encounter of 10/21/23  Blood Cultures x 2 sites     Status: Abnormal   Collection Time: 10/21/23 11:27 AM   Specimen: Site Not Specified; Blood  Result Value Ref Range Status   Specimen Description   Final    SITE NOT SPECIFIED Performed at [facility], [address] [place], [place] [address]    Special Requests   Final    BOTTLES DRAWN AEROBIC AND ANAEROBIC Blood Culture adequate volume Performed at [facility], [address], [place], [place] [address]    Culture  Setup Time   Final    GRAM POSITIVE COCCI IN CLUSTERS IN BOTH AEROBIC AND ANAEROBIC BOTTLES CRITICAL RESULT CALLED TO, READ BACK BY AND VERIFIED WITH: PHARMD G ABBOTT 10/22/2023 @ 0610 BY AB Performed at [facility], [address] [place], [place] [address]    Culture STAPHYLOCOCCUS AUREUS (A)  Final   Report Status 10/24/2023 FINAL  Final   Organism ID, Bacteria STAPHYLOCOCCUS AUREUS  Final       Susceptibility   Staphylococcus aureus - MIC*    CIPROFLOXACIN >=8 RESISTANT Resistant     ERYTHROMYCIN >=8 RESISTANT Resistant     GENTAMICIN <=0.5 SENSITIVE Sensitive     OXACILLIN 0.5 SENSITIVE Sensitive     TETRACYCLINE <=1 SENSITIVE Sensitive     VANCOMYCIN  1 SENSITIVE Sensitive     TRIMETH /SULFA  >=320 RESISTANT Resistant     CLINDAMYCIN  <=0.25 SENSITIVE Sensitive     RIFAMPIN <=0.5 SENSITIVE Sensitive     Inducible Clindamycin  NEGATIVE Sensitive     LINEZOLID 2 SENSITIVE Sensitive     * STAPHYLOCOCCUS AUREUS  Blood Culture ID Panel (Reflexed)     Status: Abnormal   Collection Time: 10/21/23 11:27 AM  Result Value Ref Range Status   Enterococcus faecalis NOT DETECTED NOT DETECTED Final   Enterococcus Faecium NOT DETECTED NOT DETECTED Final   Listeria monocytogenes NOT DETECTED NOT DETECTED Final   Staphylococcus species DETECTED (A) NOT DETECTED Final    Comment: CRITICAL RESULT CALLED TO, READ BACK BY AND VERIFIED WITH: PHARMD G ABBOTT 10/22/2023 @ 0610 BY AB    Staphylococcus aureus (BCID) DETECTED (A) NOT DETECTED Final    Comment: CRITICAL RESULT CALLED TO, READ BACK BY AND VERIFIED WITH: PHARMD G ABBOTT 10/22/2023 @ 0610 BY AB    Staphylococcus epidermidis NOT DETECTED NOT DETECTED Final   Staphylococcus lugdunensis NOT DETECTED NOT DETECTED Final   Streptococcus species NOT DETECTED NOT DETECTED Final   Streptococcus agalactiae NOT DETECTED NOT DETECTED Final   Streptococcus pneumoniae NOT DETECTED NOT DETECTED Final   Streptococcus pyogenes NOT DETECTED NOT DETECTED Final   A.calcoaceticus-baumannii NOT DETECTED NOT DETECTED Final   Bacteroides fragilis NOT DETECTED NOT DETECTED Final   Enterobacterales NOT DETECTED NOT DETECTED Final   Enterobacter cloacae complex NOT DETECTED NOT DETECTED Final   Escherichia coli NOT DETECTED NOT DETECTED Final   Klebsiella aerogenes NOT DETECTED NOT DETECTED Final   Klebsiella oxytoca NOT DETECTED NOT DETECTED Final   Klebsiella  pneumoniae NOT DETECTED NOT DETECTED Final   Proteus species NOT DETECTED NOT DETECTED Final   Salmonella species NOT DETECTED NOT DETECTED Final   Serratia marcescens NOT DETECTED NOT DETECTED Final   Haemophilus influenzae NOT DETECTED NOT DETECTED Final  Neisseria meningitidis NOT DETECTED NOT DETECTED Final   Pseudomonas aeruginosa NOT DETECTED NOT DETECTED Final   Stenotrophomonas maltophilia NOT DETECTED NOT DETECTED Final   Candida albicans NOT DETECTED NOT DETECTED Final   Candida auris NOT DETECTED NOT DETECTED Final   Candida glabrata NOT DETECTED NOT DETECTED Final   Candida krusei NOT DETECTED NOT DETECTED Final   Candida parapsilosis NOT DETECTED NOT DETECTED Final   Candida tropicalis NOT DETECTED NOT DETECTED Final   Cryptococcus neoformans/gattii NOT DETECTED NOT DETECTED Final   Meth resistant mecA/C and MREJ NOT DETECTED NOT DETECTED Final    Comment: Performed at [facility], [address], [place], [place] [address]  Blood Cultures x 2 sites     Status: Abnormal   Collection Time: 10/21/23 12:19 PM   Specimen: BLOOD  Result Value Ref Range Status   Specimen Description   Final    BLOOD BLOOD RIGHT ARM Performed at [facility], [address] [place], [place] [address]    Special Requests   Final    BOTTLES DRAWN AEROBIC AND ANAEROBIC Blood Culture adequate volume Performed at [facility], [address] [place], [place] [address]    Culture  Setup Time   Final    GRAM POSITIVE COCCI IN CLUSTERS ANAEROBIC BOTTLE ONLY CRITICAL VALUE NOTED.  VALUE IS CONSISTENT WITH PREVIOUSLY REPORTED AND CALLED VALUE.    Culture (A)  Final    STAPHYLOCOCCUS AUREUS SUSCEPTIBILITIES PERFORMED ON PREVIOUS CULTURE WITHIN THE LAST 5 DAYS. Performed at [facility], [address], [place], [place] [address]    Report Status 10/24/2023 FINAL  Final  Body fluid culture w Gram Stain     Status: None   Collection Time:  10/21/23  1:59 PM   Specimen: Synovium; Synovial Fluid  Result Value Ref Range Status   Specimen Description   Final    SYNOVIAL Performed at [facility], [address], [place], [place] [address]    Special Requests   Final    NONE LEFT ELBOW Performed at [facility], [address] [place], [place] [address]    Gram Stain   Final    ABUNDANT WBC PRESENT,BOTH PMN AND MONONUCLEAR ABUNDANT GRAM POSITIVE COCCI Performed at [facility], [address], [place], [place] [address]    Culture ABUNDANT STAPHYLOCOCCUS AUREUS  Final   Report Status 10/23/2023 FINAL  Final   Organism ID, Bacteria STAPHYLOCOCCUS AUREUS  Final      Susceptibility   Staphylococcus aureus - MIC*    CIPROFLOXACIN >=8 RESISTANT Resistant     ERYTHROMYCIN >=8 RESISTANT Resistant     GENTAMICIN <=0.5 SENSITIVE Sensitive     OXACILLIN 0.5 SENSITIVE Sensitive     TETRACYCLINE <=1 SENSITIVE Sensitive     VANCOMYCIN  <=0.5 SENSITIVE Sensitive     TRIMETH /SULFA  >=320 RESISTANT Resistant     CLINDAMYCIN  <=0.25 SENSITIVE Sensitive     RIFAMPIN <=0.5 SENSITIVE Sensitive     Inducible Clindamycin  NEGATIVE Sensitive     LINEZOLID 2 SENSITIVE Sensitive     * ABUNDANT STAPHYLOCOCCUS AUREUS  Gram stain     Status: None   Collection Time: 10/21/23  1:59 PM   Specimen: Synovium; Body Fluid  Result Value Ref Range Status   Specimen Description SYNOVIAL  Final   Special Requests LEFT ELBOW  Final   Gram Stain   Final    ABUNDANT WBC SEEN GRAM POSITIVE COCCI Gram Stain Report Called  to,Read Back By and Verified With: Alonna Art RN AT 1529 ON 10/21/2023 BY Anibal Kent Performed at [facility], [address] [place], [place] [address]    Report Status 10/21/2023 FINAL  Final  Aerobic/Anaerobic Culture w Gram Stain (surgical/deep wound)     Status: None   Collection Time: 10/23/23  2:37 PM   Specimen: Synovial, Left Elbow; Body Fluid  Result Value Ref Range  Status   Specimen Description SYNOVIAL  Final   Special Requests SWAB OF LEFT ELBOW SYNOVIAL FLUID  Final   Gram Stain   Final    ABUNDANT WBC PRESENT, PREDOMINANTLY PMN RARE GRAM POSITIVE COCCI IN PAIRS Gram Stain Report Called to,Read Back By and Verified With: RN B. HICKLIN  [id number]@ 2036 FH    Culture   Final    MODERATE STAPHYLOCOCCUS AUREUS NO ANAEROBES ISOLATED Performed at [facility], [address], [place], [place] [address]    Report Status 10/28/2023 FINAL  Final   Organism ID, Bacteria STAPHYLOCOCCUS AUREUS  Final      Susceptibility   Staphylococcus aureus - MIC*    CIPROFLOXACIN >=8 RESISTANT Resistant     ERYTHROMYCIN >=8 RESISTANT Resistant     GENTAMICIN <=0.5 SENSITIVE Sensitive     OXACILLIN 0.5 SENSITIVE Sensitive     TETRACYCLINE <=1 SENSITIVE Sensitive     VANCOMYCIN  1 SENSITIVE Sensitive     TRIMETH /SULFA  >=320 RESISTANT Resistant     CLINDAMYCIN  <=0.25 SENSITIVE Sensitive     RIFAMPIN <=0.5 SENSITIVE Sensitive     Inducible Clindamycin  NEGATIVE Sensitive     LINEZOLID 2 SENSITIVE Sensitive     * MODERATE STAPHYLOCOCCUS AUREUS  Aerobic/Anaerobic Culture w Gram Stain (surgical/deep wound)     Status: None   Collection Time: 10/23/23  3:20 PM   Specimen: Soft Tissue, Other  Result Value Ref Range Status   Specimen Description TISSUE  Final   Special Requests OLECRANON BURSA  Final   Gram Stain   Final    NO WBC SEEN RARE GRAM POSITIVE COCCI IN PAIRS Gram Stain Report Called to,Read Back By and Verified With: RN Janey Meek [phone] @ [phone] FH    Culture   Final    RARE STAPHYLOCOCCUS AUREUS RARE STAPHYLOCOCCUS EPIDERMIDIS NO ANAEROBES ISOLATED Performed at [facility], [address], [place], [place] [address]    Report Status 10/28/2023 FINAL  Final   Organism ID, Bacteria STAPHYLOCOCCUS AUREUS  Final   Organism ID, Bacteria STAPHYLOCOCCUS EPIDERMIDIS  Final      Susceptibility   Staphylococcus aureus - MIC*    CIPROFLOXACIN >=8  RESISTANT Resistant     ERYTHROMYCIN >=8 RESISTANT Resistant     GENTAMICIN <=0.5 SENSITIVE Sensitive     OXACILLIN 0.5 SENSITIVE Sensitive     TETRACYCLINE <=1 SENSITIVE Sensitive     VANCOMYCIN  <=0.5 SENSITIVE Sensitive     TRIMETH /SULFA  >=320 RESISTANT Resistant     CLINDAMYCIN  <=0.25 SENSITIVE Sensitive     RIFAMPIN <=0.5 SENSITIVE Sensitive     Inducible Clindamycin  NEGATIVE Sensitive     LINEZOLID 2 SENSITIVE Sensitive     * RARE STAPHYLOCOCCUS AUREUS   Staphylococcus epidermidis - MIC*    CIPROFLOXACIN <=0.5 SENSITIVE Sensitive     ERYTHROMYCIN <=0.25 SENSITIVE Sensitive     GENTAMICIN <=0.5 SENSITIVE Sensitive     OXACILLIN <=0.25 SENSITIVE Sensitive     TETRACYCLINE 2 SENSITIVE Sensitive     VANCOMYCIN  2 SENSITIVE Sensitive     TRIMETH /SULFA  <=10 SENSITIVE Sensitive  CLINDAMYCIN  <=0.25 SENSITIVE Sensitive     RIFAMPIN <=0.5 SENSITIVE Sensitive     Inducible Clindamycin  NEGATIVE Sensitive     * RARE STAPHYLOCOCCUS EPIDERMIDIS  Culture, blood (Routine X 2) w Reflex to ID Panel     Status: None   Collection Time: 10/23/23 10:13 PM   Specimen: BLOOD  Result Value Ref Range Status   Specimen Description BLOOD SITE NOT SPECIFIED  Final   Special Requests   Final    BOTTLES DRAWN AEROBIC AND ANAEROBIC Blood Culture adequate volume   Culture   Final    NO GROWTH 5 DAYS Performed at [facility], [address], [place], [place] [address]    Report Status 10/28/2023 FINAL  Final  Culture, blood (Routine X 2) w Reflex to ID Panel     Status: None   Collection Time: 10/23/23 10:15 PM   Specimen: BLOOD  Result Value Ref Range Status   Specimen Description BLOOD SITE NOT SPECIFIED  Final   Special Requests   Final    BOTTLES DRAWN AEROBIC AND ANAEROBIC Blood Culture adequate volume   Culture   Final    NO GROWTH 5 DAYS Performed at [facility], [address] [place], [place] [address]    Report Status 10/28/2023 FINAL  Final   Assessment/Plan #  Complicated MSSA bacteremia - 4/22 blood cx cleared  - 4/15 TTE and 4/23 TEE negative for endocarditis   # Left elbow septic  bursitis - 4/22 s/p I and D with hardware removal with one screw remaining. Or Cx MSSA and MSSE  # Lumbar epidural abscess  - no surgical intervention done, Nsx closely following   Plan -Continue IV cefazolin  1 g q12 based on current CrCL through 6/17 -Fu in 3 weeks prior to EOT after MRI L spine done -Fu with Nsx as instructed  # Medication management  - 5/12 3/9/7.6/94, cr 1.84, ESR 22, CRP 11  # PICC  - no concerns   # acute on chronic swelling of left leg - unclear if true cellulitis, but already covered with cefazolin  - Unlikely MRSA but already on day 4 of doxycycline , can finish of remaining 3 days course - US  of left leg to r/o DVT - Elevated leg - Fu with PCP for management of diuretics   # MDS w thrombocytopenia  - on retacrit  every 2 weeks  - Follows Oncology   I personally spent 55 minutes involved in face-to-face and non-face-to-face activities for this patient on the day of the visit. Professional time spent includes the following activities: Preparing to see the patient (review of tests), Obtaining and reviewing separately obtained history (discharge record), Performing a medically appropriate examination and evaluation , Ordering Ultrasound, referring and communicating with other health care professionals for scheduling US , Documenting clinical information in the EMR, Independently interpreting results (not separately reported), Communicating results to the patient/daughter, Counseling and educating the patient/daughter and Care coordination (not separately reported).   Of note, portions of this note may have been created with voice recognition software. While this note has been edited for accuracy, occasional wrong-word or 'sound-a-like' substitutions may have occurred due to the inherent limitations of voice recognition software.   Melvina Stage, MD [facility] 11/15/2023, 2:34 PM

## 2023-11-16 ENCOUNTER — Ambulatory Visit: Payer: Self-pay | Admitting: Infectious Diseases

## 2023-11-16 ENCOUNTER — Encounter (HOSPITAL_COMMUNITY)

## 2023-11-16 DIAGNOSIS — M009 Pyogenic arthritis, unspecified: Secondary | ICD-10-CM | POA: Diagnosis not present

## 2023-11-16 NOTE — Telephone Encounter (Signed)
 Spoke with patient regarding results. No questions at this time. Justin [name], RMA

## 2023-11-16 NOTE — Telephone Encounter (Signed)
-----   Message from Melvina Stage sent at 11/16/2023  9:06 AM EDT ----- Please let patient know US  negative for DVT in his leg.

## 2023-11-17 DIAGNOSIS — M7989 Other specified soft tissue disorders: Secondary | ICD-10-CM | POA: Insufficient documentation

## 2023-11-17 DIAGNOSIS — Z79899 Other long term (current) drug therapy: Secondary | ICD-10-CM | POA: Insufficient documentation

## 2023-11-17 DIAGNOSIS — Z452 Encounter for adjustment and management of vascular access device: Secondary | ICD-10-CM | POA: Insufficient documentation

## 2023-11-19 ENCOUNTER — Telehealth: Payer: Self-pay | Admitting: Internal Medicine

## 2023-11-19 ENCOUNTER — Encounter (HOSPITAL_COMMUNITY)

## 2023-11-19 ENCOUNTER — Telehealth: Payer: Self-pay

## 2023-11-19 DIAGNOSIS — M009 Pyogenic arthritis, unspecified: Secondary | ICD-10-CM | POA: Diagnosis not present

## 2023-11-19 NOTE — Telephone Encounter (Signed)
 Received voicemail from [place], [facility] RN with [facility] stating patient missed his evening dose of cefazolin  on 5/17 because it was delivered to the wrong house. She would like to know if any adjustments need to be made.   Illene Malm [phone]  Arlon Bergamo, BSN, RN

## 2023-11-19 NOTE — Telephone Encounter (Signed)
 Orders to extend by one dose sent to Kay Parson, RN with [facility]. Called [place] and notified her of extension.   Vayda Dungee, BSN, RN

## 2023-11-19 NOTE — Telephone Encounter (Signed)
 Copied from CRM [id number]. Topic: Referral - Question >> Nov 19, 2023 12:27 PM Juleen Oakland F wrote: Reason for CRM: Patient was referred to urology but he wants to be seen sooner then 7/2, says he really needs to be seen and wants to know if Dr. Georgia Kipper can refer him to another urologist sooner.

## 2023-11-21 ENCOUNTER — Other Ambulatory Visit: Payer: Self-pay | Admitting: Internal Medicine

## 2023-11-22 ENCOUNTER — Inpatient Hospital Stay: Payer: PPO

## 2023-11-22 NOTE — Telephone Encounter (Signed)
 The patient needs to call the Urology office and get on the cancellation list. I doubt will be any faster elsewhere Thank you

## 2023-11-22 NOTE — Telephone Encounter (Signed)
 Per pts PCP "The patient needs to call the Urology office and get on the cancellation list. I doubt will be any faster elsewhere Thank you"  Please inform pt if he calls the office back as I tried to reach him this morning and was unsuccessful.

## 2023-11-24 DIAGNOSIS — M009 Pyogenic arthritis, unspecified: Secondary | ICD-10-CM | POA: Diagnosis not present

## 2023-11-26 DIAGNOSIS — M009 Pyogenic arthritis, unspecified: Secondary | ICD-10-CM | POA: Diagnosis not present

## 2023-11-29 ENCOUNTER — Inpatient Hospital Stay

## 2023-11-29 VITALS — BP 133/80 | HR 58 | Temp 97.7°F | Resp 16

## 2023-11-29 DIAGNOSIS — D469 Myelodysplastic syndrome, unspecified: Secondary | ICD-10-CM

## 2023-11-29 DIAGNOSIS — D4620 Refractory anemia with excess of blasts, unspecified: Secondary | ICD-10-CM | POA: Diagnosis not present

## 2023-11-29 LAB — CMP (CANCER CENTER ONLY)
ALT: <5 U/L (ref 0–44)
AST: 14 U/L — ABNORMAL LOW (ref 15–41)
Albumin: 3.8 g/dL (ref 3.5–5.0)
Alkaline Phosphatase: 70 U/L (ref 38–126)
Anion gap: 6 (ref 5–15)
BUN: 11 mg/dL (ref 8–23)
CO2: 30 mmol/L (ref 22–32)
Calcium: 9.1 mg/dL (ref 8.9–10.3)
Chloride: 105 mmol/L (ref 98–111)
Creatinine: 1 mg/dL (ref 0.61–1.24)
GFR, Estimated: >60 mL/min (ref >60)
Glucose, Bld: 112 mg/dL — ABNORMAL HIGH (ref 70–99)
Potassium: 3.4 mmol/L — ABNORMAL LOW (ref 3.5–5.1)
Sodium: 141 mmol/L (ref 135–145)
Total Bilirubin: 0.6 mg/dL (ref 0.0–1.2)
Total Protein: 7 g/dL (ref 6.5–8.1)

## 2023-11-29 LAB — CBC WITH DIFFERENTIAL (CANCER CENTER ONLY)
Abs Immature Granulocytes: 0.01 10*3/uL (ref 0.00–0.07)
Basophils Absolute: 0 10*3/uL (ref 0.0–0.1)
Basophils Relative: 1 %
Eosinophils Absolute: 0 10*3/uL (ref 0.0–0.5)
Eosinophils Relative: 1 %
HCT: 28.3 % — ABNORMAL LOW (ref 39.0–52.0)
Hemoglobin: 8.8 g/dL — ABNORMAL LOW (ref 13.0–17.0)
Immature Granulocytes: 1 %
Lymphocytes Relative: 52 %
Lymphs Abs: 1.2 10*3/uL (ref 0.7–4.0)
MCH: 25.2 pg — ABNORMAL LOW (ref 26.0–34.0)
MCHC: 31.1 g/dL (ref 30.0–36.0)
MCV: 81.1 fL (ref 80.0–100.0)
Monocytes Absolute: 0.4 10*3/uL (ref 0.1–1.0)
Monocytes Relative: 18 %
Neutro Abs: 0.6 10*3/uL — ABNORMAL LOW (ref 1.7–7.7)
Neutrophils Relative %: 27 %
Platelet Count: 38 10*3/uL — ABNORMAL LOW (ref 150–400)
RBC: 3.49 MIL/uL — ABNORMAL LOW (ref 4.22–5.81)
RDW: 19.9 % — ABNORMAL HIGH (ref 11.5–15.5)
WBC Count: 2.2 10*3/uL — ABNORMAL LOW (ref 4.0–10.5)
nRBC: 0 % (ref 0.0–0.2)

## 2023-11-29 MED ORDER — EPOETIN ALFA 20000 UNIT/ML IJ SOLN
20000.0000 [IU] | Freq: Once | INTRAMUSCULAR | Status: AC
  Administered 2023-11-29: 20000 [IU] via SUBCUTANEOUS
  Filled 2023-11-29: qty 1

## 2023-11-30 DIAGNOSIS — K5900 Constipation, unspecified: Secondary | ICD-10-CM | POA: Diagnosis not present

## 2023-11-30 DIAGNOSIS — E876 Hypokalemia: Secondary | ICD-10-CM | POA: Diagnosis not present

## 2023-11-30 DIAGNOSIS — H353122 Nonexudative age-related macular degeneration, left eye, intermediate dry stage: Secondary | ICD-10-CM | POA: Diagnosis not present

## 2023-11-30 DIAGNOSIS — M720 Palmar fascial fibromatosis [Dupuytren]: Secondary | ICD-10-CM | POA: Diagnosis not present

## 2023-11-30 DIAGNOSIS — M009 Pyogenic arthritis, unspecified: Secondary | ICD-10-CM | POA: Diagnosis not present

## 2023-11-30 DIAGNOSIS — Z452 Encounter for adjustment and management of vascular access device: Secondary | ICD-10-CM | POA: Diagnosis not present

## 2023-11-30 DIAGNOSIS — I2510 Atherosclerotic heart disease of native coronary artery without angina pectoris: Secondary | ICD-10-CM | POA: Diagnosis not present

## 2023-11-30 DIAGNOSIS — H353111 Nonexudative age-related macular degeneration, right eye, early dry stage: Secondary | ICD-10-CM | POA: Diagnosis not present

## 2023-11-30 DIAGNOSIS — G8929 Other chronic pain: Secondary | ICD-10-CM | POA: Diagnosis not present

## 2023-11-30 DIAGNOSIS — I10 Essential (primary) hypertension: Secondary | ICD-10-CM | POA: Diagnosis not present

## 2023-11-30 DIAGNOSIS — N400 Enlarged prostate without lower urinary tract symptoms: Secondary | ICD-10-CM | POA: Diagnosis not present

## 2023-11-30 DIAGNOSIS — H409 Unspecified glaucoma: Secondary | ICD-10-CM | POA: Diagnosis not present

## 2023-12-03 ENCOUNTER — Inpatient Hospital Stay: Admitting: Infectious Diseases

## 2023-12-03 DIAGNOSIS — M009 Pyogenic arthritis, unspecified: Secondary | ICD-10-CM | POA: Diagnosis not present

## 2023-12-04 ENCOUNTER — Encounter: Payer: Self-pay | Admitting: Internal Medicine

## 2023-12-04 ENCOUNTER — Ambulatory Visit (INDEPENDENT_AMBULATORY_CARE_PROVIDER_SITE_OTHER): Admitting: Internal Medicine

## 2023-12-04 VITALS — BP 110/78 | HR 68 | Temp 97.9°F | Ht 69.0 in

## 2023-12-04 DIAGNOSIS — R601 Generalized edema: Secondary | ICD-10-CM | POA: Diagnosis not present

## 2023-12-04 DIAGNOSIS — D649 Anemia, unspecified: Secondary | ICD-10-CM

## 2023-12-04 DIAGNOSIS — L304 Erythema intertrigo: Secondary | ICD-10-CM

## 2023-12-04 DIAGNOSIS — R32 Unspecified urinary incontinence: Secondary | ICD-10-CM

## 2023-12-04 DIAGNOSIS — M71122 Other infective bursitis, left elbow: Secondary | ICD-10-CM

## 2023-12-04 MED ORDER — CLOTRIMAZOLE-BETAMETHASONE 1-0.05 % EX CREA
1.0000 | TOPICAL_CREAM | Freq: Every day | CUTANEOUS | 0 refills | Status: DC
Start: 2023-12-04 — End: 2024-02-09

## 2023-12-04 NOTE — Assessment & Plan Note (Addendum)
 Complicated MSSA bacteremia Left elbow septic  bursitis -  4/22 s/p I and D with hardware removal with one screw remaining.   Lumbar epidural abscess - no surgical intervention done Follow-up with infectious disease and orthopedic surgery

## 2023-12-04 NOTE — Assessment & Plan Note (Signed)
 MDS related

## 2023-12-04 NOTE — Assessment & Plan Note (Signed)
 Hold Flomax  Use Depends

## 2023-12-04 NOTE — Progress Notes (Signed)
 Subjective:  Patient ID: Justin Kingdom [name]., male    DOB: Nov 06, 1940  Age: 83 y.o. MRN: [id number]  CC: Medical Management of Chronic Issues (4 week f/u)   HPI Justin Kinser [employer]. presents for posthospital follow-up-septic bursitis of the left elbow. On IV abx at home F/u on HTN, MDS, edema Complicated MSSA bacteremia Left elbow septic  bursitis -  4/22 s/p I and D with hardware removal with one screw remaining.   Lumbar epidural abscess - no surgical intervention done C/o incontinence at night    Outpatient Medications Prior to Visit  Medication Sig Dispense Refill   acetaminophen  (TYLENOL ) 500 MG tablet Take 1,000 mg by mouth every 6 (six) hours as needed for mild pain (pain score 1-3) or moderate pain (pain score 4-6).     amLODipine  (NORVASC ) 5 MG tablet TAKE 1 TABLET BY MOUTH DAILY (Patient taking differently: Take 5 mg by mouth at bedtime.) 90 tablet 3   calcium  carbonate (TUMS EX) 750 MG chewable tablet Chew 1,500 mg by mouth daily as needed for heartburn.     carbidopa -levodopa  (SINEMET  IR) 25-100 MG tablet For breakthrough restless leg, you can take 1 tablet at bedtime.  OK to take extra tab as needed during the day. (Patient taking differently: Take 1 tablet by mouth at bedtime.) 100 tablet 3   ceFAZolin  (ANCEF ) IVPB Inject 2 g into the vein every 8 (eight) hours. Indication:  MSSA septic joint +  lumbar septic arthritis/epidural abscess First Dose: yes Last Day of Therapy:  12/18/23 Labs - Once weekly:  CBC/D and BMP, Labs - Once weekly: ESR and CRP Method of administration: IV Push or 6g daily as a continuous infusion based on family preference Method of administration may be changed at the discretion of home infusion pharmacist based upon assessment of the patient and/or caregiver's ability to self-administer the medication ordered. 160 Units 0   Cholecalciferol  1000 UNITS tablet Take 3,000 Units by mouth daily.     Cyanocobalamin  (VITAMIN B-12) 1000 MCG SUBL  DISSOLVE 2 TABLETS IN MOUTH EVERY DAY 180 tablet 1   finasteride  (PROSCAR ) 5 MG tablet TAKE 1 TABLET (5 MG TOTAL) BY MOUTH DAILY. 90 tablet 1   gabapentin  (NEURONTIN ) 300 MG capsule Take 1 capsule (300 mg total) by mouth 3 (three) times daily.     ibuprofen (ADVIL) 200 MG tablet Take 400 mg by mouth every 6 (six) hours as needed for mild pain (pain score 1-3) or moderate pain (pain score 4-6).     magnesium  hydroxide (MILK OF MAGNESIA) 400 MG/5ML suspension Take 15 mLs by mouth daily as needed for mild constipation.     meclizine  (ANTIVERT ) 12.5 MG tablet TAKE 1 TABLET BY MOUTH 3 TIMES DAILY AS NEEDED FOR DIZZINESS. 90 tablet 2   Multiple Vitamins-Minerals (PRESERVISION AREDS PO) Take 2 tablets by mouth 2 (two) times daily.     ondansetron  (ZOFRAN ) 4 MG tablet Take 1 tablet (4 mg total) by mouth every 6 (six) hours as needed for nausea or vomiting. 30 tablet 0   Polyethyl Glycol-Propyl Glycol (SYSTANE OP) Place 1 drop into both eyes 2 (two) times daily as needed (dry eyes). [facility] OTC     pravastatin  (PRAVACHOL ) 20 MG tablet TAKE 1 TABLET (20 MG TOTAL) BY MOUTH DAILY. 90 tablet 1   rOPINIRole  (REQUIP ) 2 MG tablet TAKE 1 TABLET BY MOUTH AT AT NOON AND 1 TAB AT 4PM AND 1 TAB AT BEDTIME (Patient taking differently: Take 1 mg by mouth in the  morning, at noon, in the evening, and at bedtime.) 270 tablet 1   tamsulosin  (FLOMAX ) 0.4 MG CAPS capsule TAKE 1 CAPSULE (0.4 MG TOTAL) BY MOUTH IN THE MORNING. 90 capsule 1   terazosin  (HYTRIN ) 2 MG capsule TAKE 1 CAPSULE BY MOUTH EVERY NIGHT AT BEDTIME 90 capsule 1   torsemide  (DEMADEX ) 100 MG tablet Take 0.5-1 tablets (50-100 mg total) by mouth daily. 90 tablet 1   Oxycodone  HCl 10 MG TABS Take 1 tablet (10 mg total) by mouth 5 (five) times daily for 5 days. 25 tablet 0   No facility-administered medications prior to visit.    ROS: Review of Systems  Constitutional:  Negative for appetite change, fatigue and unexpected weight change.  HENT:  Negative for  congestion, nosebleeds, sneezing, sore throat and trouble swallowing.   Eyes:  Negative for itching and visual disturbance.  Respiratory:  Negative for cough.   Cardiovascular:  Negative for chest pain, palpitations and leg swelling.  Gastrointestinal:  Negative for abdominal distention, blood in stool, diarrhea and nausea.  Genitourinary:  Negative for frequency and hematuria.  Musculoskeletal:  Positive for arthralgias, back pain, gait problem and neck stiffness. Negative for joint swelling and neck pain.  Skin:  Negative for rash.  Neurological:  Negative for dizziness, tremors, speech difficulty and weakness.  Psychiatric/Behavioral:  Negative for agitation, dysphoric mood, sleep disturbance and suicidal ideas. The patient is not nervous/anxious.     Objective:  BP 110/78   Pulse 68   Temp 97.9 F (36.6 C) (Oral)   Ht 5' 9 (1.753 m)   SpO2 92%   BMI 24.84 kg/m   BP Readings from Last 3 Encounters:  12/13/23 137/63  12/11/23 (!) 145/83  12/04/23 110/78    Wt Readings from Last 3 Encounters:  10/21/23 168 lb 3.4 oz (76.3 kg)  09/11/23 162 lb 6.4 oz (73.7 kg)  09/04/23 162 lb (73.5 kg)    Physical Exam Constitutional:      General: He is not in acute distress.    Appearance: Normal appearance. He is well-developed.     Comments: NAD   Eyes:     Conjunctiva/sclera: Conjunctivae normal.     Pupils: Pupils are equal, round, and reactive to light.   Neck:     Thyroid : No thyromegaly.     Vascular: No JVD.   Cardiovascular:     Rate and Rhythm: Normal rate and regular rhythm.     Heart sounds: Normal heart sounds. No murmur heard.    No friction rub. No gallop.  Pulmonary:     Effort: Pulmonary effort is normal. No respiratory distress.     Breath sounds: Normal breath sounds. No wheezing or rales.  Chest:     Chest wall: No tenderness.  Abdominal:     General: Bowel sounds are normal. There is no distension.     Palpations: Abdomen is soft. There is no mass.      Tenderness: There is no abdominal tenderness. There is no left CVA tenderness, guarding or rebound.   Musculoskeletal:        General: Tenderness present. Normal range of motion.     Cervical back: Normal range of motion.     Right lower leg: Edema present.     Left lower leg: Edema present.  Lymphadenopathy:     Cervical: No cervical adenopathy.   Skin:    General: Skin is warm and dry.     Findings: No rash.   Neurological:  Mental Status: He is alert and oriented to person, place, and time.     Cranial Nerves: No cranial nerve deficit.     Motor: No abnormal muscle tone.     Coordination: Coordination normal.     Gait: Gait normal.     Deep Tendon Reflexes: Reflexes are normal and symmetric.   Psychiatric:        Behavior: Behavior normal.        Thought Content: Thought content normal.        Judgment: Judgment normal.   In a w/c  Edema is better  Lab Results  Component Value Date   WBC 2.2 (L) 12/13/2023   HGB 9.1 (L) 12/13/2023   HCT 29.6 (L) 12/13/2023   PLT 40 (L) 12/13/2023   GLUCOSE 115 (H) 12/13/2023   CHOL 136 01/12/2017   TRIG 67.0 01/12/2017   HDL 54.70 01/12/2017   LDLCALC 68 01/12/2017   ALT 6 12/13/2023   AST 19 12/13/2023   NA 140 12/13/2023   K 4.1 12/13/2023   CL 106 12/13/2023   CREATININE 0.90 12/13/2023   BUN 15 12/13/2023   CO2 23 12/13/2023   TSH 3.05 10/16/2019   PSA 0.47 08/30/2016   INR 1.1 10/28/2020   HGBA1C 5.3 04/19/2021    US  Venous Img Lower Unilateral Left (DVT) Result Date: 11/15/2023 CLINICAL DATA:  LEFT lower extremity for 3 weeks. EXAM: LEFT LOWER EXTREMITY VENOUS DOPPLER ULTRASOUND TECHNIQUE: Gray-scale sonography with compression, as well as color and duplex ultrasound, were performed to evaluate the deep venous system(s) from the level of the common femoral vein through the popliteal and proximal calf veins. COMPARISON:  None available FINDINGS: VENOUS Normal compressibility of the common femoral, superficial  femoral, and popliteal veins, as well as the visualized calf veins. Visualized portions of profunda femoral vein and great saphenous vein unremarkable. No filling defects to suggest DVT on grayscale or color Doppler imaging. Doppler waveforms show normal direction of venous flow, normal respiratory plasticity and response to augmentation. Limited views of the contralateral common femoral vein are unremarkable. OTHER Extensive subcutaneous edema of the calf. Limitations: none IMPRESSION: No DVT of the left lower extremity. Electronically Signed   By: Elester Grim M.D.   On: 11/15/2023 16:17    Assessment & Plan:   Problem List Items Addressed This Visit     Edema   Edema is getting better.      Anemia   MDS related      Septic bursitis of elbow - Primary   Complicated MSSA bacteremia Left elbow septic  bursitis -  4/22 s/p I and D with hardware removal with one screw remaining.   Lumbar epidural abscess - no surgical intervention done Follow-up with infectious disease and orthopedic surgery      Relevant Medications   clotrimazole -betamethasone  (LOTRISONE ) cream   Urinary incontinence   Hold Flomax  Use Depends      Intertrigo   Lotrisone  was prescribed         Meds ordered this encounter  Medications   clotrimazole -betamethasone  (LOTRISONE ) cream    Sig: Apply 1 Application topically daily.    Dispense:  60 g    Refill:  0      Follow-up: Return in about 6 weeks (around 01/15/2024) for a follow-up visit.  Anitra Barn, MD

## 2023-12-06 ENCOUNTER — Inpatient Hospital Stay: Payer: PPO

## 2023-12-11 ENCOUNTER — Other Ambulatory Visit: Payer: Self-pay

## 2023-12-11 ENCOUNTER — Ambulatory Visit: Admitting: Infectious Diseases

## 2023-12-11 ENCOUNTER — Telehealth: Payer: Self-pay

## 2023-12-11 ENCOUNTER — Encounter: Payer: Self-pay | Admitting: Infectious Diseases

## 2023-12-11 VITALS — BP 145/83 | HR 45 | Temp 97.9°F

## 2023-12-11 DIAGNOSIS — G062 Extradural and subdural abscess, unspecified: Secondary | ICD-10-CM | POA: Diagnosis not present

## 2023-12-11 DIAGNOSIS — M71122 Other infective bursitis, left elbow: Secondary | ICD-10-CM | POA: Diagnosis not present

## 2023-12-11 DIAGNOSIS — Z79899 Other long term (current) drug therapy: Secondary | ICD-10-CM | POA: Diagnosis not present

## 2023-12-11 DIAGNOSIS — A4901 Methicillin susceptible Staphylococcus aureus infection, unspecified site: Secondary | ICD-10-CM | POA: Diagnosis not present

## 2023-12-11 DIAGNOSIS — Z452 Encounter for adjustment and management of vascular access device: Secondary | ICD-10-CM | POA: Diagnosis not present

## 2023-12-11 NOTE — Progress Notes (Unsigned)
 Patient Active Problem List   Diagnosis Date Noted   Swelling of lower leg 11/17/2023   PICC (peripherally inserted central catheter) in place 11/17/2023   Medication management 11/17/2023   Erysipelas of both lower extremities 11/12/2023   Urinary incontinence 11/12/2023   Septic bursitis of elbow 11/06/2023   Abscess of epidural space of spine due to bacteria 10/25/2023   Epidural abscess 10/25/2023   MSSA bacteremia 10/22/2023   Hypokalemia 10/21/2023   Acute on chronic pain of left elbow concerning for septic arthritis 10/21/2023   Spinal stenosis of lumbar region 10/16/2023   Pseudophakia of both eyes 02/15/2022   MDS (myelodysplastic syndrome) (HCC) 12/25/2021   Abnormal EKG 11/18/2021   Thrombocytopenia (HCC) 11/18/2021   Lumbar spondylosis 07/22/2021   Lumbar radiculopathy 03/24/2021   S/P total knee arthroplasty, right 11/09/2020   Weight loss 08/30/2020   Complication associated with orthopedic device (HCC) 06/10/2020   Diastolic dysfunction 10/27/2019   Exudative age-related macular degeneration of left eye with active choroidal neovascularization (HCC) 10/15/2019   Intermediate stage nonexudative age-related macular degeneration of left eye 10/15/2019   Early stage nonexudative age-related macular degeneration of right eye 10/15/2019   Pseudophakia 10/15/2019   Posterior vitreous detachment of left eye 10/15/2019   GERD (gastroesophageal reflux disease) 11/13/2018   Coronary artery disease 10/24/2018   Claudication (HCC) 02/22/2018   Anemia 08/28/2017   DOE (dyspnea on exertion) 07/31/2017   Fatigue 07/31/2017   Essential hypertension 11/29/2016   Smoking greater than 30 pack years 02/02/2016   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) 02/28/2013   Ingrowing toenail of left foot 08/29/2012   Allergic rhinitis 10/10/2011   Dupuytren's contracture of hand 07/05/2011   Vitamin B12 deficiency 12/23/2010   BPH (benign prostatic hyperplasia) 12/23/2010   Low back pain  07/27/2009   TOBACCO USE, QUIT 07/27/2009   Diverticulosis of colon 03/28/2009   RLS (restless legs syndrome) 10/28/2008   SKIN RASH, ALLERGIC 10/01/2008   Edema 10/01/2008   Neuropathy 04/22/2008   Vitamin D  deficiency 01/21/2008   INSOMNIA, CHRONIC 12/23/2007   Osteoarthritis 12/23/2007   CAROTID BRUIT, LEFT 12/23/2007   Current Outpatient Medications on File Prior to Visit  Medication Sig Dispense Refill   acetaminophen  (TYLENOL ) 500 MG tablet Take 1,000 mg by mouth every 6 (six) hours as needed for mild pain (pain score 1-3) or moderate pain (pain score 4-6).     amLODipine  (NORVASC ) 5 MG tablet TAKE 1 TABLET BY MOUTH DAILY (Patient taking differently: Take 5 mg by mouth at bedtime.) 90 tablet 3   calcium  carbonate (TUMS EX) 750 MG chewable tablet Chew 1,500 mg by mouth daily as needed for heartburn.     carbidopa -levodopa  (SINEMET  IR) 25-100 MG tablet For breakthrough restless leg, you can take 1 tablet at bedtime.  OK to take extra tab as needed during the day. (Patient taking differently: Take 1 tablet by mouth at bedtime.) 100 tablet 3   ceFAZolin  (ANCEF ) IVPB Inject 2 g into the vein every 8 (eight) hours. Indication:  MSSA septic joint +  lumbar septic arthritis/epidural abscess First Dose: yes Last Day of Therapy:  12/18/23 Labs - Once weekly:  CBC/D and BMP, Labs - Once weekly: ESR and CRP Method of administration: IV Push or 6g daily as a continuous infusion based on family preference Method of administration may be changed at the discretion of home infusion pharmacist based upon assessment of the patient and/or caregiver's ability to self-administer the medication ordered. 160 Units 0   Cholecalciferol   1000 UNITS tablet Take 3,000 Units by mouth daily.     clotrimazole -betamethasone  (LOTRISONE ) cream Apply 1 Application topically daily. 60 g 0   Cyanocobalamin  (VITAMIN B-12) 1000 MCG SUBL DISSOLVE 2 TABLETS IN MOUTH EVERY DAY 180 tablet 1   finasteride  (PROSCAR ) 5 MG tablet  TAKE 1 TABLET (5 MG TOTAL) BY MOUTH DAILY. 90 tablet 1   gabapentin  (NEURONTIN ) 300 MG capsule Take 1 capsule (300 mg total) by mouth 3 (three) times daily.     ibuprofen (ADVIL) 200 MG tablet Take 400 mg by mouth every 6 (six) hours as needed for mild pain (pain score 1-3) or moderate pain (pain score 4-6).     magnesium  hydroxide (MILK OF MAGNESIA) 400 MG/5ML suspension Take 15 mLs by mouth daily as needed for mild constipation.     meclizine  (ANTIVERT ) 12.5 MG tablet TAKE 1 TABLET BY MOUTH 3 TIMES DAILY AS NEEDED FOR DIZZINESS. 90 tablet 2   Multiple Vitamins-Minerals (PRESERVISION AREDS PO) Take 2 tablets by mouth 2 (two) times daily.     ondansetron  (ZOFRAN ) 4 MG tablet Take 1 tablet (4 mg total) by mouth every 6 (six) hours as needed for nausea or vomiting. 30 tablet 0   Polyethyl Glycol-Propyl Glycol (SYSTANE OP) Place 1 drop into both eyes 2 (two) times daily as needed (dry eyes). [facility]     pravastatin  (PRAVACHOL ) 20 MG tablet TAKE 1 TABLET (20 MG TOTAL) BY MOUTH DAILY. 90 tablet 1   rOPINIRole  (REQUIP ) 2 MG tablet TAKE 1 TABLET BY MOUTH AT AT NOON AND 1 TAB AT 4PM AND 1 TAB AT BEDTIME (Patient taking differently: Take 1 mg by mouth in the morning, at noon, in the evening, and at bedtime.) 270 tablet 1   tamsulosin  (FLOMAX ) 0.4 MG CAPS capsule TAKE 1 CAPSULE (0.4 MG TOTAL) BY MOUTH IN THE MORNING. 90 capsule 1   terazosin  (HYTRIN ) 2 MG capsule TAKE 1 CAPSULE BY MOUTH EVERY NIGHT AT BEDTIME 90 capsule 1   torsemide  (DEMADEX ) 100 MG tablet Take 0.5-1 tablets (50-100 mg total) by mouth daily. 90 tablet 1   Oxycodone  HCl 10 MG TABS Take 1 tablet (10 mg total) by mouth 5 (five) times daily for 5 days. 25 tablet 0   No current facility-administered medications on file prior to visit.   Subjective: Discussed the use of AI scribe software for clinical note transcription with the patient, who gave verbal consent to proceed.   83 year old male with prior history of HTN, CAD, MDS with  thrombocytopenia, depression BCC, BPH, melanoma, GERD, DDD, OA, TIA, h/o Tobacco/alcohol  abuse who is here for HFU 4/20-4/25 for left elbow septic bursitis and lumbar epidural abscess.  Patient had an open fracture of his left elbow in 2007. Approximately 4 years ago one of the pins came loose and it was taken out and he had limited range of motion in his left elbow with inability to fully flex and extend and unable to function with his left hand. Left Elbow x-ray as below. 4/20 left elbow SF Gram stain with GPC. Cx with MSSA.  4/20 blood culture with MSSA.  4/22 underwent I&D with removal of hardware, 1 screw remaining ( no OP note available). OR cx with MSSA and MSSE. TTE and TEE negative for vegetations or endocarditis.  MRI L spine septic arthritis of left L5-S1.  Left dorsal epidural abscess extending from L4-S1, phlegmonous change in the left psoas.  No surgical intervention per neurosurgery.  Also required PRBC transfusion for anemia. Discharged  on 4/25 to complete 8 weeks course of IV cefazolin  through 6/17  5/15 Patient is accompanied by his daughter surgery.  Getting IV antibiotics through PICC without any concerns related to antibiotics or PICC.  Cefazolin  was recently reduced to 1 g every 12 dosing due to elevated kidney function.  Daughter reports increased swelling and redness in his left leg compared to right leg for the last few days history of chronic intermittent swelling of bilateral legs.  Seen by PCP on Monday and was started on a 7-day course of doxycycline .  Patient is also on diuretic. Saw Dr. Guyann Leitz yesterday and discharged from his care as left elbow has healed. Also saw Dr Larrie Po post discharge and, wants to repeat MRI end of month to see if improvement and needs for +/- surgery.  6/10 Accompanied by daughter. Getting IV cefazolin  through PICC without any concerns. Left elbow wound has healed. He has experienced significant improvement in mobility since the last visit three weeks  ago. He is now able to walk to the bathroom and bed with the aid of a walker and can walk to the car instead of using a wheelchair. However, he continues to use a wheelchair for longer distances due to balance issues.  He experiences significant back pain, particularly when lying down, rating it close to ten on a scale of one to ten. The pain is alleviated when sitting. He is under pain management with oxycodone , which he takes as needed, usually less than the prescribed five times a day. He is scheduled for an MRI 6/11.  Next fu with Dr Larrie Po on July 1. Has one more fu with Dr Guyann Leitz. Seen by PCP on 6/3, leg swelling has improved since last visit and has stopped lasix  recently due to need to frequently urinate at night.   Review of Systems: All systems reviewed and negative except as above.  Denies fever, chills, sweats.  Denies nausea, vomiting or diarrhea.  Past Medical History:  Diagnosis Date   Alcoholism (HCC)    Dry 13 years   Basal cell carcinoma    Right Chest   BPH (benign prostatic hyperplasia)    Diverticulosis of colon    Dizzy spells    none recent   DJD (degenerative joint disease)    lower back   Elbow fracture, left 2009   Dr Guyann Leitz   FRACTURE, [place], LEFT 12/23/2007   Qualifier: Diagnosis of   By: Marthe Slain        GERD (gastroesophageal reflux disease)    Glaucoma    History of TIAs 1 yrs ago   Hyperkeratosis    LBP (low back pain)    Macular degeneration    left wet right dry sees dr Seward Dao   Melanoma [facility]) 2009   x3 Dr Amy [place]   Osteoarthritis    Restless leg syndrome    Thrombocytopenia (HCC) 11/18/2021   Tinnitus    Tobacco abuse    Uses walker    Wears glasses    reading   Wears partial dentures    upper   Past Surgical History:  Procedure Laterality Date   CATARACT EXTRACTION Bilateral 2018   HARDWARE REMOVAL Left 07/09/2020   Procedure: Left elbow hardware removal;  Surgeon: Janeth Medicus, MD;  [facility];  Service: Orthopedics;  Laterality: Left;  60 mins   HARDWARE REMOVAL Left 10/23/2023   Procedure: REMOVAL, HARDWARE;  Surgeon: Hardy Lia, MD;  Location: [facility] OR;  Service: Orthopedics;  Laterality: Left;   IRRIGATION AND DEBRIDEMENT ELBOW Left 10/23/2023   Procedure: IRRIGATION AND DEBRIDEMENT ELBOW;  Surgeon: Hardy Lia, MD;  Location: [facility] OR;  Service: Orthopedics;  Laterality: Left;   mda     Dr. Seward Dao wet injection   MELANOMA EXCISION  2009   x3   RECONSTRUCTION MEDIAL COLLATERAL LIGAMENT ELBOW W/ TENDON GRAFT  2009   Left, Dr Guyann Leitz   TONSILLECTOMY  as child   TOTAL KNEE ARTHROPLASTY Right 11/09/2020   Procedure: TOTAL KNEE ARTHROPLASTY;  Surgeon: Claiborne Crew, MD;  Location: [facility] ORS;  Service: Orthopedics;  Laterality: Right;   TRANSESOPHAGEAL ECHOCARDIOGRAM (CATH LAB) N/A 10/24/2023   Procedure: TRANSESOPHAGEAL ECHOCARDIOGRAM;  Surgeon: Hugh Madura, MD;  Location: [facility] INVASIVE CV LAB;  Service: Cardiovascular;  Laterality: N/A;    Social History   Tobacco Use   Smoking status: Former    Current packs/day: 0.00    Average packs/day: 3.0 packs/day for 38.0 years (114.0 ttl pk-yrs)    Types: Cigarettes    Start date: 66    Quit date: 46    Years since quitting: 39.4   Smokeless tobacco: Never   Tobacco comments:    states he quit May 15 1985  Vaping Use   Vaping status: Never Used  Substance Use Topics   Alcohol  use: No    Comment: PREVIOUS - DRY 60yrs   Drug use: Not Currently    Family History  Problem Relation Age of Onset   Cancer Mother        ?breast   Cancer Father        Lung    Allergies  Allergen Reactions   Levofloxacin Other (See Comments)    REACTION: hands pealed    Health Maintenance  Topic Date Due   COVID-19 Vaccine (4 - 2024-25 season) 03/04/2023   INFLUENZA VACCINE  02/01/2024   Medicare Annual Wellness (AWV)  07/19/2024   DTaP/Tdap/Td (3 - Td or Tdap) 05/24/2032   Pneumonia Vaccine 65+ Years old  Completed   HPV  VACCINES  Aged Out   Meningococcal B Vaccine  Aged Out   Zoster Vaccines- Shingrix  Discontinued   Objective: BP (!) 145/83   Pulse (!) 45   Temp 97.9 F (36.6 C) (Temporal)   SpO2 99%   Physical Exam Constitutional:      Appearance: Normal appearance.  HENT:     Head: Normocephalic and atraumatic.      Mouth: Mucous membranes are moist.  Eyes:    Conjunctiva/sclera: Conjunctivae normal.     Pupils: Pupils are equal, round, and b/l symmetrical    Cardiovascular:     Rate and Rhythm: Normal rate and regular rhythm.     Heart sounds:  Pulmonary:     Effort: Pulmonary effort is normal.     Breath sounds:  Abdominal:     General: Non distended     Palpations:   Musculoskeletal:        General: sitting in the wheelchair.  Left elbow wound has healed with no signs of infection. NO wound in the back.   Improved swelling in the b/l lower extremities, no signs of cellulitis   Skin:    General: Skin is warm and dry. Rt TKA with no signs of infection    Comments: PICC OK with no signs of infection  Neurological:     General: grossly non focal     Mental Status: awake, alert and oriented to person, place, and time.   Psychiatric:  Mood and Affect: Mood normal.   Lab Results Lab Results  Component Value Date   WBC 2.2 (L) 11/29/2023   HGB 8.8 (L) 11/29/2023   HCT 28.3 (L) 11/29/2023   MCV 81.1 11/29/2023   PLT 38 (L) 11/29/2023    Lab Results  Component Value Date   CREATININE 1.00 11/29/2023   BUN 11 11/29/2023   NA 141 11/29/2023   K 3.4 (L) 11/29/2023   CL 105 11/29/2023   CO2 30 11/29/2023    Lab Results  Component Value Date   ALT <5 11/29/2023   AST 14 (L) 11/29/2023   ALKPHOS 70 11/29/2023   BILITOT 0.6 11/29/2023    Lab Results  Component Value Date   CHOL 136 01/12/2017   HDL 54.70 01/12/2017   LDLCALC 68 01/12/2017   TRIG 67.0 01/12/2017   CHOLHDL 2 01/12/2017   No results found for: LABRPR, RPRTITER No results found for:  HIV1RNAQUANT, HIV1RNAVL, CD4TABS   Microbiology Results for orders placed or performed during the hospital encounter of 10/21/23  Blood Cultures x 2 sites     Status: Abnormal   Collection Time: 10/21/23 11:27 AM   Specimen: Site Not Specified; Blood  Result Value Ref Range Status   Specimen Description   Final    SITE NOT SPECIFIED Performed at [facility], [address] [place], [place] [address]    Special Requests   Final    BOTTLES DRAWN AEROBIC AND ANAEROBIC Blood Culture adequate volume Performed at [facility], [address], [place], [place] [address]    Culture  Setup Time   Final    GRAM POSITIVE COCCI IN CLUSTERS IN BOTH AEROBIC AND ANAEROBIC BOTTLES CRITICAL RESULT CALLED TO, READ BACK BY AND VERIFIED WITH: PHARMD G ABBOTT 10/22/2023 @ 0610 BY AB Performed at [facility], [address] [place], [place] [address]    Culture STAPHYLOCOCCUS AUREUS (A)  Final   Report Status 10/24/2023 FINAL  Final   Organism ID, Bacteria STAPHYLOCOCCUS AUREUS  Final      Susceptibility   Staphylococcus aureus - MIC*    CIPROFLOXACIN >=8 RESISTANT Resistant     ERYTHROMYCIN >=8 RESISTANT Resistant     GENTAMICIN <=0.5 SENSITIVE Sensitive     OXACILLIN 0.5 SENSITIVE Sensitive     TETRACYCLINE <=1 SENSITIVE Sensitive     VANCOMYCIN  1 SENSITIVE Sensitive     TRIMETH /SULFA  >=320 RESISTANT Resistant     CLINDAMYCIN  <=0.25 SENSITIVE Sensitive     RIFAMPIN <=0.5 SENSITIVE Sensitive     Inducible Clindamycin  NEGATIVE Sensitive     LINEZOLID 2 SENSITIVE Sensitive     * STAPHYLOCOCCUS AUREUS  Blood Culture ID Panel (Reflexed)     Status: Abnormal   Collection Time: 10/21/23 11:27 AM  Result Value Ref Range Status   Enterococcus faecalis NOT DETECTED NOT DETECTED Final   Enterococcus Faecium NOT DETECTED NOT DETECTED Final   Listeria monocytogenes NOT DETECTED NOT DETECTED Final   Staphylococcus species DETECTED (A) NOT DETECTED  Final    Comment: CRITICAL RESULT CALLED TO, READ BACK BY AND VERIFIED WITH: PHARMD G ABBOTT 10/22/2023 @ 0610 BY AB    Staphylococcus aureus (BCID) DETECTED (A) NOT DETECTED Final    Comment: CRITICAL RESULT CALLED TO, READ BACK BY AND VERIFIED WITH: PHARMD G ABBOTT 10/22/2023 @ 0610 BY AB    Staphylococcus epidermidis NOT DETECTED NOT DETECTED Final   Staphylococcus lugdunensis NOT DETECTED NOT DETECTED Final   Streptococcus species NOT DETECTED NOT  DETECTED Final   Streptococcus agalactiae NOT DETECTED NOT DETECTED Final   Streptococcus pneumoniae NOT DETECTED NOT DETECTED Final   Streptococcus pyogenes NOT DETECTED NOT DETECTED Final   A.calcoaceticus-baumannii NOT DETECTED NOT DETECTED Final   Bacteroides fragilis NOT DETECTED NOT DETECTED Final   Enterobacterales NOT DETECTED NOT DETECTED Final   Enterobacter cloacae complex NOT DETECTED NOT DETECTED Final   Escherichia coli NOT DETECTED NOT DETECTED Final   Klebsiella aerogenes NOT DETECTED NOT DETECTED Final   Klebsiella oxytoca NOT DETECTED NOT DETECTED Final   Klebsiella pneumoniae NOT DETECTED NOT DETECTED Final   Proteus species NOT DETECTED NOT DETECTED Final   Salmonella species NOT DETECTED NOT DETECTED Final   Serratia marcescens NOT DETECTED NOT DETECTED Final   Haemophilus influenzae NOT DETECTED NOT DETECTED Final   Neisseria meningitidis NOT DETECTED NOT DETECTED Final   Pseudomonas aeruginosa NOT DETECTED NOT DETECTED Final   Stenotrophomonas maltophilia NOT DETECTED NOT DETECTED Final   Candida albicans NOT DETECTED NOT DETECTED Final   Candida auris NOT DETECTED NOT DETECTED Final   Candida glabrata NOT DETECTED NOT DETECTED Final   Candida krusei NOT DETECTED NOT DETECTED Final   Candida parapsilosis NOT DETECTED NOT DETECTED Final   Candida tropicalis NOT DETECTED NOT DETECTED Final   Cryptococcus neoformans/gattii NOT DETECTED NOT DETECTED Final   Meth resistant mecA/C and MREJ NOT DETECTED NOT DETECTED  Final    Comment: Performed at [facility], [address], [place], [place] [address]  Blood Cultures x 2 sites     Status: Abnormal   Collection Time: 10/21/23 12:19 PM   Specimen: BLOOD  Result Value Ref Range Status   Specimen Description   Final    BLOOD BLOOD RIGHT ARM Performed at [facility], [address] [place], [place] [address]    Special Requests   Final    BOTTLES DRAWN AEROBIC AND ANAEROBIC Blood Culture adequate volume Performed at [facility], [address], [place], [place] [address]    Culture  Setup Time   Final    GRAM POSITIVE COCCI IN CLUSTERS ANAEROBIC BOTTLE ONLY CRITICAL VALUE NOTED.  VALUE IS CONSISTENT WITH PREVIOUSLY REPORTED AND CALLED VALUE.    Culture (A)  Final    STAPHYLOCOCCUS AUREUS SUSCEPTIBILITIES PERFORMED ON PREVIOUS CULTURE WITHIN THE LAST 5 DAYS. Performed at [facility], [address], [place], [place] [address]    Report Status 10/24/2023 FINAL  Final  Body fluid culture w Gram Stain     Status: None   Collection Time: 10/21/23  1:59 PM   Specimen: Synovium; Synovial Fluid  Result Value Ref Range Status   Specimen Description   Final    SYNOVIAL Performed at [facility], [address] [place], [place] [address]    Special Requests   Final    NONE LEFT ELBOW Performed at [facility], [address], [place], [place] [address]    Gram Stain   Final    ABUNDANT WBC PRESENT,BOTH PMN AND MONONUCLEAR ABUNDANT GRAM POSITIVE COCCI Performed at [facility], [address], [place], [place] [address]    Culture ABUNDANT STAPHYLOCOCCUS AUREUS  Final   Report Status 10/23/2023 FINAL  Final   Organism ID, Bacteria STAPHYLOCOCCUS AUREUS  Final      Susceptibility   Staphylococcus aureus - MIC*    CIPROFLOXACIN >=8 RESISTANT Resistant     ERYTHROMYCIN >=8 RESISTANT Resistant     GENTAMICIN <=0.5  SENSITIVE Sensitive     OXACILLIN 0.5  SENSITIVE Sensitive     TETRACYCLINE <=1 SENSITIVE Sensitive     VANCOMYCIN  <=0.5 SENSITIVE Sensitive     TRIMETH /SULFA  >=320 RESISTANT Resistant     CLINDAMYCIN  <=0.25 SENSITIVE Sensitive     RIFAMPIN <=0.5 SENSITIVE Sensitive     Inducible Clindamycin  NEGATIVE Sensitive     LINEZOLID 2 SENSITIVE Sensitive     * ABUNDANT STAPHYLOCOCCUS AUREUS  Gram stain     Status: None   Collection Time: 10/21/23  1:59 PM   Specimen: Synovium; Body Fluid  Result Value Ref Range Status   Specimen Description SYNOVIAL  Final   Special Requests LEFT ELBOW  Final   Gram Stain   Final    ABUNDANT WBC SEEN GRAM POSITIVE COCCI Gram Stain Report Called to,Read Back By and Verified With: Alonna Art RN AT 1529 ON 10/21/2023 BY Anibal Kent Performed at [facility], [address], [place], [place] [address]    Report Status 10/21/2023 FINAL  Final  Aerobic/Anaerobic Culture w Gram Stain (surgical/deep wound)     Status: None   Collection Time: 10/23/23  2:37 PM   Specimen: Synovial, Left Elbow; Body Fluid  Result Value Ref Range Status   Specimen Description SYNOVIAL  Final   Special Requests SWAB OF LEFT ELBOW SYNOVIAL FLUID  Final   Gram Stain   Final    ABUNDANT WBC PRESENT, PREDOMINANTLY PMN RARE GRAM POSITIVE COCCI IN PAIRS Gram Stain Report Called to,Read Back By and Verified With: RN B. HICKLIN  [id number]@ 2036 FH    Culture   Final    MODERATE STAPHYLOCOCCUS AUREUS NO ANAEROBES ISOLATED Performed at [facility], [address] [place], [place] [address]    Report Status 10/28/2023 FINAL  Final   Organism ID, Bacteria STAPHYLOCOCCUS AUREUS  Final      Susceptibility   Staphylococcus aureus - MIC*    CIPROFLOXACIN >=8 RESISTANT Resistant     ERYTHROMYCIN >=8 RESISTANT Resistant     GENTAMICIN <=0.5 SENSITIVE Sensitive     OXACILLIN 0.5 SENSITIVE Sensitive     TETRACYCLINE <=1 SENSITIVE Sensitive     VANCOMYCIN  1 SENSITIVE Sensitive     TRIMETH /SULFA  >=320 RESISTANT  Resistant     CLINDAMYCIN  <=0.25 SENSITIVE Sensitive     RIFAMPIN <=0.5 SENSITIVE Sensitive     Inducible Clindamycin  NEGATIVE Sensitive     LINEZOLID 2 SENSITIVE Sensitive     * MODERATE STAPHYLOCOCCUS AUREUS  Aerobic/Anaerobic Culture w Gram Stain (surgical/deep wound)     Status: None   Collection Time: 10/23/23  3:20 PM   Specimen: Soft Tissue, Other  Result Value Ref Range Status   Specimen Description TISSUE  Final   Special Requests OLECRANON BURSA  Final   Gram Stain   Final    NO WBC SEEN RARE GRAM POSITIVE COCCI IN PAIRS Gram Stain Report Called to,Read Back By and Verified With: RN Janey Meek [phone] @ [phone]    Culture   Final    RARE STAPHYLOCOCCUS AUREUS RARE STAPHYLOCOCCUS EPIDERMIDIS NO ANAEROBES ISOLATED Performed at [facility], [address] [place], [place] [address]    Report Status 10/28/2023 FINAL  Final   Organism ID, Bacteria STAPHYLOCOCCUS AUREUS  Final   Organism ID, Bacteria STAPHYLOCOCCUS EPIDERMIDIS  Final      Susceptibility   Staphylococcus aureus - MIC*    CIPROFLOXACIN >=8 RESISTANT Resistant     ERYTHROMYCIN >=8 RESISTANT Resistant  GENTAMICIN <=0.5 SENSITIVE Sensitive     OXACILLIN 0.5 SENSITIVE Sensitive     TETRACYCLINE <=1 SENSITIVE Sensitive     VANCOMYCIN  <=0.5 SENSITIVE Sensitive     TRIMETH /SULFA  >=320 RESISTANT Resistant     CLINDAMYCIN  <=0.25 SENSITIVE Sensitive     RIFAMPIN <=0.5 SENSITIVE Sensitive     Inducible Clindamycin  NEGATIVE Sensitive     LINEZOLID 2 SENSITIVE Sensitive     * RARE STAPHYLOCOCCUS AUREUS   Staphylococcus epidermidis - MIC*    CIPROFLOXACIN <=0.5 SENSITIVE Sensitive     ERYTHROMYCIN <=0.25 SENSITIVE Sensitive     GENTAMICIN <=0.5 SENSITIVE Sensitive     OXACILLIN <=0.25 SENSITIVE Sensitive     TETRACYCLINE 2 SENSITIVE Sensitive     VANCOMYCIN  2 SENSITIVE Sensitive     TRIMETH /SULFA  <=10 SENSITIVE Sensitive     CLINDAMYCIN  <=0.25 SENSITIVE Sensitive     RIFAMPIN <=0.5 SENSITIVE Sensitive      Inducible Clindamycin  NEGATIVE Sensitive     * RARE STAPHYLOCOCCUS EPIDERMIDIS  Culture, blood (Routine X 2) w Reflex to ID Panel     Status: None   Collection Time: 10/23/23 10:13 PM   Specimen: BLOOD  Result Value Ref Range Status   Specimen Description BLOOD SITE NOT SPECIFIED  Final   Special Requests   Final    BOTTLES DRAWN AEROBIC AND ANAEROBIC Blood Culture adequate volume   Culture   Final    NO GROWTH 5 DAYS Performed at [facility], [address] [place], [place] [address]    Report Status 10/28/2023 FINAL  Final  Culture, blood (Routine X 2) w Reflex to ID Panel     Status: None   Collection Time: 10/23/23 10:15 PM   Specimen: BLOOD  Result Value Ref Range Status   Specimen Description BLOOD SITE NOT SPECIFIED  Final   Special Requests   Final    BOTTLES DRAWN AEROBIC AND ANAEROBIC Blood Culture adequate volume   Culture   Final    NO GROWTH 5 DAYS Performed at [facility], [address], [place], [place] [address]    Report Status 10/28/2023 FINAL  Final   Assessment/Plan # Complicated MSSA bacteremia - 4/22 blood cx cleared  - 4/15 TTE and 4/23 TEE negative for endocarditis   # Left elbow septic  bursitis - 4/22 s/p I and D with hardware removal with one screw remaining. Or Cx MSSA and MSSE  # Lumbar epidural abscess  - no surgical intervention done, Nsx closely following   Plan -cefazolin  dosing changed to q8 from q12 dosing based on normal creatinine  -Fu after MRI done ( June 11)  on June 23 rd and plan to stop vs switch to PO cefadroxil based on MRI findings. Daughter will ask to fax the results to our office.   [name] Diagnosis: complicated MSSA infection, epidural abscess, left elbow septic bursitis   Culture Result: MSSA  Allergies  Allergen Reactions   Levofloxacin Other (See Comments)    REACTION: hands pealed    [name] Orders Discharge antibiotics to be given via PICC line Discharge antibiotics: Cefazolin  2g iv q 8 hrs   Per pharmacy protocol End Date: 6/23  [facility] Care Per Protocol:  Home health RN for IV administration and teaching; PICC line care and labs.    Labs weekly while on IV antibiotics: X__ CBC with differential X__ BMP __ CMP X__ CRP X__ ESR __ Vancomycin  trough __ CK  __ Please pull PIC at completion of IV antibiotics X__ Please  leave PIC in place until doctor has seen patient or been notified  Fax weekly labs to [phone]  Clinic Follow Up Appt:   # Medication management  - 6/2 2.4/8.5/53, cr 0.91, ESR 7, CRP2  # PICC  - no concerns   # MDS w thrombocytopenia  - on retacrit  every 2 weeks  - Follows Oncology   I spent 26 minutes involved in face-to-face and non-face-to-face activities for this patient on the day of the visit. Professional time spent includes the following activities: Preparing to see the patient (review of tests), Obtaining and reviewing separately obtained history (PCP note on 6/3), Performing a medically appropriate examination and evaluation, Documenting clinical information in the EMR, Independently interpreting results (not separately reported), Communicating results to the patient/daughter, Counseling and educating the patient/daughter and Care coordination (not separately reported).   Of note, portions of this note may have been created with voice recognition software. While this note has been edited for accuracy, occasional wrong-word or 'sound-a-like' substitutions may have occurred due to the inherent limitations of voice recognition software.   Melvina Stage, MD [facility] 12/11/2023, 10:27 AM

## 2023-12-11 NOTE — Telephone Encounter (Signed)
 Per Manandhar extend patient Iv abx until he is seen on 6/23 and change dosing to every 8 hours. Sent a message to Kay Parson RN, at [place] about changes as well.

## 2023-12-12 DIAGNOSIS — M009 Pyogenic arthritis, unspecified: Secondary | ICD-10-CM | POA: Diagnosis not present

## 2023-12-12 DIAGNOSIS — G061 Intraspinal abscess and granuloma: Secondary | ICD-10-CM | POA: Diagnosis not present

## 2023-12-12 DIAGNOSIS — M4186 Other forms of scoliosis, lumbar region: Secondary | ICD-10-CM | POA: Diagnosis not present

## 2023-12-12 DIAGNOSIS — M5136 Other intervertebral disc degeneration, lumbar region with discogenic back pain only: Secondary | ICD-10-CM | POA: Diagnosis not present

## 2023-12-13 ENCOUNTER — Telehealth: Payer: Self-pay

## 2023-12-13 ENCOUNTER — Inpatient Hospital Stay: Attending: Physician Assistant

## 2023-12-13 ENCOUNTER — Inpatient Hospital Stay

## 2023-12-13 VITALS — BP 137/63 | HR 41 | Temp 98.2°F | Resp 16

## 2023-12-13 DIAGNOSIS — D469 Myelodysplastic syndrome, unspecified: Secondary | ICD-10-CM

## 2023-12-13 DIAGNOSIS — D4620 Refractory anemia with excess of blasts, unspecified: Secondary | ICD-10-CM | POA: Diagnosis not present

## 2023-12-13 LAB — CMP (CANCER CENTER ONLY)
ALT: 6 U/L (ref 0–44)
AST: 19 U/L (ref 15–41)
Albumin: 3.6 g/dL (ref 3.5–5.0)
Alkaline Phosphatase: 65 U/L (ref 38–126)
Anion gap: 11 (ref 5–15)
BUN: 15 mg/dL (ref 8–23)
CO2: 23 mmol/L (ref 22–32)
Calcium: 9 mg/dL (ref 8.9–10.3)
Chloride: 106 mmol/L (ref 98–111)
Creatinine: 0.9 mg/dL (ref 0.61–1.24)
GFR, Estimated: >60 mL/min (ref >60)
Glucose, Bld: 115 mg/dL — ABNORMAL HIGH (ref 70–99)
Potassium: 4.1 mmol/L (ref 3.5–5.1)
Sodium: 140 mmol/L (ref 135–145)
Total Bilirubin: 0.9 mg/dL (ref 0.0–1.2)
Total Protein: 6.9 g/dL (ref 6.5–8.1)

## 2023-12-13 LAB — CBC WITH DIFFERENTIAL (CANCER CENTER ONLY)
Abs Immature Granulocytes: 0 10*3/uL (ref 0.00–0.07)
Basophils Absolute: 0 10*3/uL (ref 0.0–0.1)
Basophils Relative: 1 %
Eosinophils Absolute: 0 10*3/uL (ref 0.0–0.5)
Eosinophils Relative: 1 %
HCT: 29.6 % — ABNORMAL LOW (ref 39.0–52.0)
Hemoglobin: 9.1 g/dL — ABNORMAL LOW (ref 13.0–17.0)
Immature Granulocytes: 0 %
Lymphocytes Relative: 39 %
Lymphs Abs: 0.9 10*3/uL (ref 0.7–4.0)
MCH: 25.3 pg — ABNORMAL LOW (ref 26.0–34.0)
MCHC: 30.7 g/dL (ref 30.0–36.0)
MCV: 82.5 fL (ref 80.0–100.0)
Monocytes Absolute: 0.4 10*3/uL (ref 0.1–1.0)
Monocytes Relative: 19 %
Neutro Abs: 0.9 10*3/uL — ABNORMAL LOW (ref 1.7–7.7)
Neutrophils Relative %: 40 %
Platelet Count: 40 10*3/uL — ABNORMAL LOW (ref 150–400)
RBC: 3.59 MIL/uL — ABNORMAL LOW (ref 4.22–5.81)
RDW: 19.1 % — ABNORMAL HIGH (ref 11.5–15.5)
WBC Count: 2.2 10*3/uL — ABNORMAL LOW (ref 4.0–10.5)
nRBC: 0 % (ref 0.0–0.2)

## 2023-12-13 MED ORDER — EPOETIN ALFA 20000 UNIT/ML IJ SOLN
20000.0000 [IU] | Freq: Once | INTRAMUSCULAR | Status: AC
  Administered 2023-12-13: 20000 [IU] via SUBCUTANEOUS
  Filled 2023-12-13: qty 1

## 2023-12-13 NOTE — Telephone Encounter (Signed)
 Copied from CRM [id number]. Topic: Clinical - Home Health Verbal Orders >> Dec 13, 2023 12:37 PM Lovett Ruck C wrote: Caller/Agency: [facility] Callback Number: [phone] Service Requested: Skilled Nursing Frequency: Medical Social Worker one week one- requesting caregiver assistance  Any new concerns about the patient? No

## 2023-12-14 NOTE — Telephone Encounter (Signed)
 Okay.  Thanks.

## 2023-12-14 NOTE — Telephone Encounter (Signed)
 Spoke with [place] and was able to give verbal OK for Child psychotherapist.

## 2023-12-16 DIAGNOSIS — L304 Erythema intertrigo: Secondary | ICD-10-CM | POA: Insufficient documentation

## 2023-12-16 NOTE — Assessment & Plan Note (Signed)
 Lotrisone  was prescribed

## 2023-12-16 NOTE — Assessment & Plan Note (Signed)
 Edema is getting better.

## 2023-12-17 ENCOUNTER — Telehealth: Payer: Self-pay

## 2023-12-17 DIAGNOSIS — M009 Pyogenic arthritis, unspecified: Secondary | ICD-10-CM | POA: Diagnosis not present

## 2023-12-17 NOTE — Telephone Encounter (Signed)
 Patient's daughter called today requesting MRI results to decide if IV abx need to continue past Wednesday. Secure chat sent to [facility] requesting she review MRI results. MRI results emailed to Dr. Gillian Lacrosse to review.  Hadar Elgersma Adel Holt, CMA

## 2023-12-18 ENCOUNTER — Other Ambulatory Visit: Payer: Self-pay

## 2023-12-18 ENCOUNTER — Telehealth: Payer: Self-pay

## 2023-12-18 MED ORDER — CEFADROXIL 500 MG PO CAPS: 1000.0000 mg | ORAL_CAPSULE | Freq: Two times a day (BID) | ORAL | 0 refills | Status: DC

## 2023-12-18 NOTE — Telephone Encounter (Signed)
-----   Message from Melvina Stage sent at 12/18/2023  6:16 AM EDT ----- Regarding: MRI follow up Please let patient/daughter know I have reviewed the MRI, improved findings.   DC PICC line and please send PO cefadroxil 1g po bid to to last until visit with Dr  Shereen Dike on June 23. Please scan MRI findings in the chart or Dr Everlean Hoar inbox as next visit with Dr Shereen Dike.

## 2023-12-18 NOTE — Telephone Encounter (Signed)
 Spoke to patient daughter advised her of MRI results. Patient daughter advised that IV abx will end and patient will start oral cefadroxil and to follow up with Dr. Shereen Dike on 12/24/23.   Rx sent.   Updated [name] orders sent to [facility] as well to end IV abx and picc line can be removed. Cathrine Krizan Adel Holt, CMA

## 2023-12-20 ENCOUNTER — Inpatient Hospital Stay: Payer: PPO

## 2023-12-20 DIAGNOSIS — M009 Pyogenic arthritis, unspecified: Secondary | ICD-10-CM | POA: Diagnosis not present

## 2023-12-24 ENCOUNTER — Ambulatory Visit: Admitting: Internal Medicine

## 2023-12-24 ENCOUNTER — Other Ambulatory Visit: Payer: Self-pay

## 2023-12-24 ENCOUNTER — Encounter: Payer: Self-pay | Admitting: Internal Medicine

## 2023-12-24 VITALS — BP 132/7 | HR 80 | Temp 97.1°F | Ht 71.0 in | Wt 151.0 lb

## 2023-12-24 DIAGNOSIS — M869 Osteomyelitis, unspecified: Secondary | ICD-10-CM

## 2023-12-24 MED ORDER — LINEZOLID 600 MG PO TABS: 600.0000 mg | ORAL_TABLET | Freq: Two times a day (BID) | ORAL | 0 refills | Status: AC

## 2023-12-24 NOTE — Progress Notes (Signed)
 Patient Active Problem List   Diagnosis Date Noted   Intertrigo 12/16/2023   MSSA (methicillin susceptible Staphylococcus aureus) infection 12/11/2023   Swelling of lower leg 11/17/2023   PICC (peripherally inserted central catheter) in place 11/17/2023   Medication management 11/17/2023   Erysipelas of both lower extremities 11/12/2023   Urinary incontinence 11/12/2023   Septic bursitis of elbow 11/06/2023   Abscess of epidural space of spine due to bacteria 10/25/2023   Epidural abscess 10/25/2023   MSSA bacteremia 10/22/2023   Hypokalemia 10/21/2023   Acute on chronic pain of left elbow concerning for septic arthritis 10/21/2023   Spinal stenosis of lumbar region 10/16/2023   Pseudophakia of both eyes 02/15/2022   MDS (myelodysplastic syndrome) (HCC) 12/25/2021   Abnormal EKG 11/18/2021   Thrombocytopenia (HCC) 11/18/2021   Lumbar spondylosis 07/22/2021   Lumbar radiculopathy 03/24/2021   S/P total knee arthroplasty, right 11/09/2020   Weight loss 08/30/2020   Complication associated with orthopedic device (HCC) 06/10/2020   Diastolic dysfunction 10/27/2019   Exudative age-related macular degeneration of left eye with active choroidal neovascularization (HCC) 10/15/2019   Intermediate stage nonexudative age-related macular degeneration of left eye 10/15/2019   Early stage nonexudative age-related macular degeneration of right eye 10/15/2019   Pseudophakia 10/15/2019   Posterior vitreous detachment of left eye 10/15/2019   GERD (gastroesophageal reflux disease) 11/13/2018   Coronary artery disease 10/24/2018   Claudication (HCC) 02/22/2018   Anemia 08/28/2017   DOE (dyspnea on exertion) 07/31/2017   Fatigue 07/31/2017   Essential hypertension 11/29/2016   Smoking greater than 30 pack years 02/02/2016   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) 02/28/2013   Ingrowing toenail of left foot 08/29/2012   Allergic rhinitis 10/10/2011   Dupuytren's contracture of hand 07/05/2011    Vitamin B12 deficiency 12/23/2010   BPH (benign prostatic hyperplasia) 12/23/2010   Low back pain 07/27/2009   TOBACCO USE, QUIT 07/27/2009   Diverticulosis of colon 03/28/2009   RLS (restless legs syndrome) 10/28/2008   SKIN RASH, ALLERGIC 10/01/2008   Edema 10/01/2008   Neuropathy 04/22/2008   Vitamin D  deficiency 01/21/2008   INSOMNIA, CHRONIC 12/23/2007   Osteoarthritis 12/23/2007   CAROTID BRUIT, LEFT 12/23/2007   Current Outpatient Medications on File Prior to Visit  Medication Sig Dispense Refill   acetaminophen  (TYLENOL ) 500 MG tablet Take 1,000 mg by mouth every 6 (six) hours as needed for mild pain (pain score 1-3) or moderate pain (pain score 4-6).     amLODipine  (NORVASC ) 5 MG tablet TAKE 1 TABLET BY MOUTH DAILY 90 tablet 3   calcium  carbonate (TUMS EX) 750 MG chewable tablet Chew 1,500 mg by mouth daily as needed for heartburn.     carbidopa -levodopa  (SINEMET  IR) 25-100 MG tablet For breakthrough restless leg, you can take 1 tablet at bedtime.  OK to take extra tab as needed during the day. 100 tablet 3   cefadroxil (DURICEF) 500 MG capsule Take 2 capsules (1,000 mg total) by mouth 2 (two) times daily. 120 capsule 0   Cholecalciferol  1000 UNITS tablet Take 3,000 Units by mouth daily.     clotrimazole -betamethasone  (LOTRISONE ) cream Apply 1 Application topically daily. 60 g 0   Cyanocobalamin  (VITAMIN B-12) 1000 MCG SUBL DISSOLVE 2 TABLETS IN MOUTH EVERY DAY 180 tablet 1   finasteride  (PROSCAR ) 5 MG tablet TAKE 1 TABLET (5 MG TOTAL) BY MOUTH DAILY. 90 tablet 1   gabapentin  (NEURONTIN ) 300 MG capsule Take 1 capsule (300 mg total) by mouth 3 (three) times daily.  ibuprofen (ADVIL) 200 MG tablet Take 400 mg by mouth every 6 (six) hours as needed for mild pain (pain score 1-3) or moderate pain (pain score 4-6).     magnesium  hydroxide (MILK OF MAGNESIA) 400 MG/5ML suspension Take 15 mLs by mouth daily as needed for mild constipation.     meclizine  (ANTIVERT ) 12.5 MG tablet TAKE  1 TABLET BY MOUTH 3 TIMES DAILY AS NEEDED FOR DIZZINESS. 90 tablet 2   Multiple Vitamins-Minerals (PRESERVISION AREDS PO) Take 2 tablets by mouth 2 (two) times daily.     ondansetron  (ZOFRAN ) 4 MG tablet Take 1 tablet (4 mg total) by mouth every 6 (six) hours as needed for nausea or vomiting. 30 tablet 0   Oxycodone  HCl 10 MG TABS Take 1 tablet (10 mg total) by mouth 5 (five) times daily for 5 days. 25 tablet 0   Polyethyl Glycol-Propyl Glycol (SYSTANE OP) Place 1 drop into both eyes 2 (two) times daily as needed (dry eyes). [facility]     pravastatin  (PRAVACHOL ) 20 MG tablet TAKE 1 TABLET (20 MG TOTAL) BY MOUTH DAILY. 90 tablet 1   rOPINIRole  (REQUIP ) 2 MG tablet TAKE 1 TABLET BY MOUTH AT AT NOON AND 1 TAB AT 4PM AND 1 TAB AT BEDTIME (Patient taking differently: Take 1 mg by mouth in the morning, at noon, in the evening, and at bedtime.) 270 tablet 1   tamsulosin  (FLOMAX ) 0.4 MG CAPS capsule TAKE 1 CAPSULE (0.4 MG TOTAL) BY MOUTH IN THE MORNING. 90 capsule 1   terazosin  (HYTRIN ) 2 MG capsule TAKE 1 CAPSULE BY MOUTH EVERY NIGHT AT BEDTIME 90 capsule 1   torsemide  (DEMADEX ) 100 MG tablet Take 0.5-1 tablets (50-100 mg total) by mouth daily. (Patient not taking: Reported on 12/24/2023) 90 tablet 1   No current facility-administered medications on file prior to visit.   Subjective: Discussed the use of AI scribe software for clinical note transcription with the patient, who gave verbal consent to proceed.   83 year old male with prior history of HTN, CAD, MDS with thrombocytopenia, depression BCC, BPH, melanoma, GERD, DDD, OA, TIA, h/o Tobacco/alcohol  abuse who is here for HFU 4/20-4/25 for left elbow septic bursitis and lumbar epidural abscess.  Patient had an open fracture of his left elbow in 2007. Approximately 4 years ago one of the pins came loose and it was taken out and he had limited range of motion in his left elbow with inability to fully flex and extend and unable to function with his left hand.  Left Elbow x-ray as below. 4/20 left elbow SF Gram stain with GPC. Cx with MSSA.  4/20 blood culture with MSSA.  4/22 underwent I&D with removal of hardware, 1 screw remaining ( no OP note available). OR cx with MSSA and MSSE. TTE and TEE negative for vegetations or endocarditis.  MRI L spine septic arthritis of left L5-S1.  Left dorsal epidural abscess extending from L4-S1, phlegmonous change in the left psoas.  No surgical intervention per neurosurgery.  Also required PRBC transfusion for anemia. Discharged on 4/25 to complete 8 weeks course of IV cefazolin  through 6/17  5/15 Patient is accompanied by his daughter surgery.  Getting IV antibiotics through PICC without any concerns related to antibiotics or PICC.  Cefazolin  was recently reduced to 1 g every 12 dosing due to elevated kidney function.  Daughter reports increased swelling and redness in his left leg compared to right leg for the last few days history of chronic intermittent swelling of bilateral  legs.  Seen by PCP on Monday and was started on a 7-day course of doxycycline .  Patient is also on diuretic. Saw Dr. Celena yesterday and discharged from his care as left elbow has healed. Also saw Dr Mavis post discharge and, wants to repeat MRI end of month to see if improvement and needs for +/- surgery.  6/10 Accompanied by daughter. Getting IV cefazolin  through PICC without any concerns. Left elbow wound has healed. He has experienced significant improvement in mobility since the last visit three weeks ago. He is now able to walk to the bathroom and bed with the aid of a walker and can walk to the car instead of using a wheelchair. However, he continues to use a wheelchair for longer distances due to balance issues.  He experiences significant back pain, particularly when lying down, rating it close to ten on a scale of one to ten. The pain is alleviated when sitting. He is under pain management with oxycodone , which he takes as needed, usually  less than the prescribed five times a day. He is scheduled for an MRI 6/11.  Next fu with Dr Mavis on July 1. Has one more fu with Dr Celena. Seen by PCP on 6/3, leg swelling has improved since last visit and has stopped lasix  recently due to need to frequently urinate at night.    ---------- 12/24/23 id clinic f/u Left elbow swelling/pain 1 day See a&p for detail Cancelled ortho f/u appointent  Review of Systems: All systems reviewed and negative except as above.  Denies fever, chills, sweats.  Denies nausea, vomiting or diarrhea.  Past Medical History:  Diagnosis Date   Alcoholism (HCC)    Dry 13 years   Basal cell carcinoma    Right Chest   BPH (benign prostatic hyperplasia)    Diverticulosis of colon    Dizzy spells    none recent   DJD (degenerative joint disease)    lower back   Elbow fracture, left 2009   Dr Celena   FRACTURE, [place], LEFT 12/23/2007   Qualifier: Diagnosis of   By: Georgian ROSALEA CHARM Lamar        GERD (gastroesophageal reflux disease)    Glaucoma    History of TIAs 1 yrs ago   Hyperkeratosis    LBP (low back pain)    Macular degeneration    left wet right dry sees dr elner   Melanoma [facility]) 2009   x3 Dr Amy [place]   Osteoarthritis    Restless leg syndrome    Thrombocytopenia (HCC) 11/18/2021   Tinnitus    Tobacco abuse    Uses walker    Wears glasses    reading   Wears partial dentures    upper   Past Surgical History:  Procedure Laterality Date   CATARACT EXTRACTION Bilateral 2018   HARDWARE REMOVAL Left 07/09/2020   Procedure: Left elbow hardware removal;  Surgeon: Sharl Selinda Dover, MD;  Location: [facility];  Service: Orthopedics;  Laterality: Left;  60 mins   HARDWARE REMOVAL Left 10/23/2023   Procedure: REMOVAL, HARDWARE;  Surgeon: Celena Sharper, MD;  Location: [facility] OR;  Service: Orthopedics;  Laterality: Left;   IRRIGATION AND DEBRIDEMENT ELBOW Left 10/23/2023   Procedure: IRRIGATION AND DEBRIDEMENT ELBOW;  Surgeon: Celena Sharper, MD;  [facility] OR;  Service: Orthopedics;  Laterality: Left;   mda     Dr. elner wet injection   MELANOMA EXCISION  2009   x3   RECONSTRUCTION MEDIAL COLLATERAL  LIGAMENT ELBOW W/ TENDON GRAFT  2009   Left, Dr Celena   TONSILLECTOMY  as child   TOTAL KNEE ARTHROPLASTY Right 11/09/2020   Procedure: TOTAL KNEE ARTHROPLASTY;  Surgeon: Ernie Cough, MD;  Location: [facility] ORS;  Service: Orthopedics;  Laterality: Right;   TRANSESOPHAGEAL ECHOCARDIOGRAM (CATH LAB) N/A 10/24/2023   Procedure: TRANSESOPHAGEAL ECHOCARDIOGRAM;  Surgeon: Jeffrie Oneil BROCKS, MD;  Location: [facility] INVASIVE CV LAB;  Service: Cardiovascular;  Laterality: N/A;    Social History   Tobacco Use   Smoking status: Former    Current packs/day: 0.00    Average packs/day: 3.0 packs/day for 38.0 years (114.0 ttl pk-yrs)    Types: Cigarettes    Start date: 63    Quit date: 39    Years since quitting: 39.5   Smokeless tobacco: Never   Tobacco comments:    states he quit May 15 1985  Vaping Use   Vaping status: Never Used  Substance Use Topics   Alcohol  use: No    Comment: PREVIOUS - DRY 59yrs   Drug use: Not Currently    Family History  Problem Relation Age of Onset   Cancer Mother        ?breast   Cancer Father        Lung    Allergies  Allergen Reactions   Levofloxacin Other (See Comments)    REACTION: hands pealed    Health Maintenance  Topic Date Due   COVID-19 Vaccine (4 - 2024-25 season) 03/04/2023   INFLUENZA VACCINE  02/01/2024   Medicare Annual Wellness (AWV)  07/19/2024   DTaP/Tdap/Td (3 - Td or Tdap) 05/24/2032   Pneumococcal Vaccine: 50+ Years  Completed   HPV VACCINES  Aged Out   Meningococcal B Vaccine  Aged Out   Zoster Vaccines- Shingrix  Discontinued   Objective: BP (!) 132/7   Pulse 80   Temp (!) 97.1 F (36.2 C) (Temporal)   Ht 5' 11 (1.803 m)   Wt 151 lb (68.5 kg)   SpO2 96%   BMI 21.06 kg/m   Physical Exam Constitutional:      Appearance: Normal appearance.  HENT:      Head: Normocephalic and atraumatic.      Mouth: Mucous membranes are moist.  Eyes:    Conjunctiva/sclera: Conjunctivae normal.     Pupils: Pupils are equal, round, and b/l symmetrical    Cardiovascular:     Rate and Rhythm: Normal rate and regular rhythm.     Heart sounds:  Pulmonary:     Effort: Pulmonary effort is normal.     Breath sounds:  Abdominal:     General: Non distended     Palpations:   Musculoskeletal/skin: Unable to extend elbow beyond 90 degree (chronic); tender 3/10 at the swelling -- warm/slightly erythematous and indurated but no fluctuance       Neurological:     General: grossly non focal     Mental Status: awake, alert and oriented to person, place, and time.   Psychiatric:        Mood and Affect: Mood normal.   Lab Results Lab Results  Component Value Date   WBC 2.2 (L) 12/13/2023   HGB 9.1 (L) 12/13/2023   HCT 29.6 (L) 12/13/2023   MCV 82.5 12/13/2023   PLT 40 (L) 12/13/2023    Lab Results  Component Value Date   CREATININE 0.90 12/13/2023   BUN 15 12/13/2023   NA 140 12/13/2023   K 4.1 12/13/2023   CL 106 12/13/2023  CO2 23 12/13/2023    Lab Results  Component Value Date   ALT 6 12/13/2023   AST 19 12/13/2023   ALKPHOS 65 12/13/2023   BILITOT 0.9 12/13/2023    Lab Results  Component Value Date   CHOL 136 01/12/2017   HDL 54.70 01/12/2017   LDLCALC 68 01/12/2017   TRIG 67.0 01/12/2017   CHOLHDL 2 01/12/2017   No results found for: LABRPR, RPRTITER No results found for: HIV1RNAQUANT, HIV1RNAVL, CD4TABS   Microbiology Results for orders placed or performed during the hospital encounter of 10/21/23  Blood Cultures x 2 sites     Status: Abnormal   Collection Time: 10/21/23 11:27 AM   Specimen: Site Not Specified; Blood  Result Value Ref Range Status   Specimen Description   Final    SITE NOT SPECIFIED Performed at [facility], [address] [place], [place] [address]    Special  Requests   Final    BOTTLES DRAWN AEROBIC AND ANAEROBIC Blood Culture adequate volume Performed at [facility], [address] [place], [place] [address]    Culture  Setup Time   Final    GRAM POSITIVE COCCI IN CLUSTERS IN BOTH AEROBIC AND ANAEROBIC BOTTLES CRITICAL RESULT CALLED TO, READ BACK BY AND VERIFIED WITH: PHARMD G ABBOTT 10/22/2023 @ 0610 BY AB Performed at [facility], [address], [place], [place] [address]    Culture STAPHYLOCOCCUS AUREUS (A)  Final   Report Status 10/24/2023 FINAL  Final   Organism ID, Bacteria STAPHYLOCOCCUS AUREUS  Final      Susceptibility   Staphylococcus aureus - MIC*    CIPROFLOXACIN >=8 RESISTANT Resistant     ERYTHROMYCIN >=8 RESISTANT Resistant     GENTAMICIN <=0.5 SENSITIVE Sensitive     OXACILLIN 0.5 SENSITIVE Sensitive     TETRACYCLINE <=1 SENSITIVE Sensitive     VANCOMYCIN  1 SENSITIVE Sensitive     TRIMETH /SULFA  >=320 RESISTANT Resistant     CLINDAMYCIN  <=0.25 SENSITIVE Sensitive     RIFAMPIN <=0.5 SENSITIVE Sensitive     Inducible Clindamycin  NEGATIVE Sensitive     LINEZOLID 2 SENSITIVE Sensitive     * STAPHYLOCOCCUS AUREUS  Blood Culture ID Panel (Reflexed)     Status: Abnormal   Collection Time: 10/21/23 11:27 AM  Result Value Ref Range Status   Enterococcus faecalis NOT DETECTED NOT DETECTED Final   Enterococcus Faecium NOT DETECTED NOT DETECTED Final   Listeria monocytogenes NOT DETECTED NOT DETECTED Final   Staphylococcus species DETECTED (A) NOT DETECTED Final    Comment: CRITICAL RESULT CALLED TO, READ BACK BY AND VERIFIED WITH: PHARMD G ABBOTT 10/22/2023 @ 0610 BY AB    Staphylococcus aureus (BCID) DETECTED (A) NOT DETECTED Final    Comment: CRITICAL RESULT CALLED TO, READ BACK BY AND VERIFIED WITH: PHARMD G ABBOTT 10/22/2023 @ 0610 BY AB    Staphylococcus epidermidis NOT DETECTED NOT DETECTED Final   Staphylococcus lugdunensis NOT DETECTED NOT DETECTED Final   Streptococcus species NOT  DETECTED NOT DETECTED Final   Streptococcus agalactiae NOT DETECTED NOT DETECTED Final   Streptococcus pneumoniae NOT DETECTED NOT DETECTED Final   Streptococcus pyogenes NOT DETECTED NOT DETECTED Final   A.calcoaceticus-baumannii NOT DETECTED NOT DETECTED Final   Bacteroides fragilis NOT DETECTED NOT DETECTED Final   Enterobacterales NOT DETECTED NOT DETECTED Final   Enterobacter cloacae complex NOT DETECTED NOT DETECTED Final   Escherichia coli NOT DETECTED NOT DETECTED Final   Klebsiella aerogenes NOT DETECTED NOT DETECTED Final  Klebsiella oxytoca NOT DETECTED NOT DETECTED Final   Klebsiella pneumoniae NOT DETECTED NOT DETECTED Final   Proteus species NOT DETECTED NOT DETECTED Final   Salmonella species NOT DETECTED NOT DETECTED Final   Serratia marcescens NOT DETECTED NOT DETECTED Final   Haemophilus influenzae NOT DETECTED NOT DETECTED Final   Neisseria meningitidis NOT DETECTED NOT DETECTED Final   Pseudomonas aeruginosa NOT DETECTED NOT DETECTED Final   Stenotrophomonas maltophilia NOT DETECTED NOT DETECTED Final   Candida albicans NOT DETECTED NOT DETECTED Final   Candida auris NOT DETECTED NOT DETECTED Final   Candida glabrata NOT DETECTED NOT DETECTED Final   Candida krusei NOT DETECTED NOT DETECTED Final   Candida parapsilosis NOT DETECTED NOT DETECTED Final   Candida tropicalis NOT DETECTED NOT DETECTED Final   Cryptococcus neoformans/gattii NOT DETECTED NOT DETECTED Final   Meth resistant mecA/C and MREJ NOT DETECTED NOT DETECTED Final    Comment: Performed at [facility], [address] [place], [place] [address]  Blood Cultures x 2 sites     Status: Abnormal   Collection Time: 10/21/23 12:19 PM   Specimen: BLOOD  Result Value Ref Range Status   Specimen Description   Final    BLOOD BLOOD RIGHT ARM Performed at [facility], [address] [place], [place] [address]    Special Requests   Final    BOTTLES DRAWN AEROBIC AND ANAEROBIC  Blood Culture adequate volume Performed at [facility], [address] [place], [place] [address]    Culture  Setup Time   Final    GRAM POSITIVE COCCI IN CLUSTERS ANAEROBIC BOTTLE ONLY CRITICAL VALUE NOTED.  VALUE IS CONSISTENT WITH PREVIOUSLY REPORTED AND CALLED VALUE.    Culture (A)  Final    STAPHYLOCOCCUS AUREUS SUSCEPTIBILITIES PERFORMED ON PREVIOUS CULTURE WITHIN THE LAST 5 DAYS. Performed at [facility], [address] [place], [place] [address]    Report Status 10/24/2023 FINAL  Final  Body fluid culture w Gram Stain     Status: None   Collection Time: 10/21/23  1:59 PM   Specimen: Synovium; Synovial Fluid  Result Value Ref Range Status   Specimen Description   Final    SYNOVIAL Performed at [facility], [address] [place], [place] [address]    Special Requests   Final    NONE LEFT ELBOW Performed at [facility], [address] [place], [place] [address]    Gram Stain   Final    ABUNDANT WBC PRESENT,BOTH PMN AND MONONUCLEAR ABUNDANT GRAM POSITIVE COCCI Performed at [facility], [address] [place], [place] [address]    Culture ABUNDANT STAPHYLOCOCCUS AUREUS  Final   Report Status 10/23/2023 FINAL  Final   Organism ID, Bacteria STAPHYLOCOCCUS AUREUS  Final      Susceptibility   Staphylococcus aureus - MIC*    CIPROFLOXACIN >=8 RESISTANT Resistant     ERYTHROMYCIN >=8 RESISTANT Resistant     GENTAMICIN <=0.5 SENSITIVE Sensitive     OXACILLIN 0.5 SENSITIVE Sensitive     TETRACYCLINE <=1 SENSITIVE Sensitive     VANCOMYCIN  <=0.5 SENSITIVE Sensitive     TRIMETH /SULFA  >=320 RESISTANT Resistant     CLINDAMYCIN  <=0.25 SENSITIVE Sensitive     RIFAMPIN <=0.5 SENSITIVE Sensitive     Inducible Clindamycin  NEGATIVE Sensitive     LINEZOLID 2 SENSITIVE Sensitive     * ABUNDANT STAPHYLOCOCCUS AUREUS  Gram stain     Status: None  Collection Time: 10/21/23  1:59 PM   Specimen: Synovium; Body  Fluid  Result Value Ref Range Status   Specimen Description SYNOVIAL  Final   Special Requests LEFT ELBOW  Final   Gram Stain   Final    ABUNDANT WBC SEEN GRAM POSITIVE COCCI Gram Stain Report Called to,Read Back By and Verified With: FRANCHOT BROCKS RN AT 1529 ON 10/21/2023 BY PRUDY POUR Performed at [facility], [address], [place], [place] [address]    Report Status 10/21/2023 FINAL  Final  Aerobic/Anaerobic Culture w Gram Stain (surgical/deep wound)     Status: None   Collection Time: 10/23/23  2:37 PM   Specimen: Synovial, Left Elbow; Body Fluid  Result Value Ref Range Status   Specimen Description SYNOVIAL  Final   Special Requests SWAB OF LEFT ELBOW SYNOVIAL FLUID  Final   Gram Stain   Final    ABUNDANT WBC PRESENT, PREDOMINANTLY PMN RARE GRAM POSITIVE COCCI IN PAIRS Gram Stain Report Called to,Read Back By and Verified With: RN B. HICKLIN  [id number]@ 2036 FH    Culture   Final    MODERATE STAPHYLOCOCCUS AUREUS NO ANAEROBES ISOLATED Performed at [facility], [address], [place], [place] [address]    Report Status 10/28/2023 FINAL  Final   Organism ID, Bacteria STAPHYLOCOCCUS AUREUS  Final      Susceptibility   Staphylococcus aureus - MIC*    CIPROFLOXACIN >=8 RESISTANT Resistant     ERYTHROMYCIN >=8 RESISTANT Resistant     GENTAMICIN <=0.5 SENSITIVE Sensitive     OXACILLIN 0.5 SENSITIVE Sensitive     TETRACYCLINE <=1 SENSITIVE Sensitive     VANCOMYCIN  1 SENSITIVE Sensitive     TRIMETH /SULFA  >=320 RESISTANT Resistant     CLINDAMYCIN  <=0.25 SENSITIVE Sensitive     RIFAMPIN <=0.5 SENSITIVE Sensitive     Inducible Clindamycin  NEGATIVE Sensitive     LINEZOLID 2 SENSITIVE Sensitive     * MODERATE STAPHYLOCOCCUS AUREUS  Aerobic/Anaerobic Culture w Gram Stain (surgical/deep wound)     Status: None   Collection Time: 10/23/23  3:20 PM   Specimen: Soft Tissue, Other  Result Value Ref Range Status   Specimen Description TISSUE  Final   Special Requests  OLECRANON BURSA  Final   Gram Stain   Final    NO WBC SEEN RARE GRAM POSITIVE COCCI IN PAIRS Gram Stain Report Called to,Read Back By and Verified With: RN EMERSON BURY [id number] @ [phone] FH    Culture   Final    RARE STAPHYLOCOCCUS AUREUS RARE STAPHYLOCOCCUS EPIDERMIDIS NO ANAEROBES ISOLATED Performed at [facility], [address], [place], [place] [address]    Report Status 10/28/2023 FINAL  Final   Organism ID, Bacteria STAPHYLOCOCCUS AUREUS  Final   Organism ID, Bacteria STAPHYLOCOCCUS EPIDERMIDIS  Final      Susceptibility   Staphylococcus aureus - MIC*    CIPROFLOXACIN >=8 RESISTANT Resistant     ERYTHROMYCIN >=8 RESISTANT Resistant     GENTAMICIN <=0.5 SENSITIVE Sensitive     OXACILLIN 0.5 SENSITIVE Sensitive     TETRACYCLINE <=1 SENSITIVE Sensitive     VANCOMYCIN  <=0.5 SENSITIVE Sensitive     TRIMETH /SULFA  >=320 RESISTANT Resistant     CLINDAMYCIN  <=0.25 SENSITIVE Sensitive     RIFAMPIN <=0.5 SENSITIVE Sensitive     Inducible Clindamycin  NEGATIVE Sensitive     LINEZOLID 2 SENSITIVE Sensitive     * RARE STAPHYLOCOCCUS AUREUS   Staphylococcus epidermidis - MIC*  CIPROFLOXACIN <=0.5 SENSITIVE Sensitive     ERYTHROMYCIN <=0.25 SENSITIVE Sensitive     GENTAMICIN <=0.5 SENSITIVE Sensitive     OXACILLIN <=0.25 SENSITIVE Sensitive     TETRACYCLINE 2 SENSITIVE Sensitive     VANCOMYCIN  2 SENSITIVE Sensitive     TRIMETH /SULFA  <=10 SENSITIVE Sensitive     CLINDAMYCIN  <=0.25 SENSITIVE Sensitive     RIFAMPIN <=0.5 SENSITIVE Sensitive     Inducible Clindamycin  NEGATIVE Sensitive     * RARE STAPHYLOCOCCUS EPIDERMIDIS  Culture, blood (Routine X 2) w Reflex to ID Panel     Status: None   Collection Time: 10/23/23 10:13 PM   Specimen: BLOOD  Result Value Ref Range Status   Specimen Description BLOOD SITE NOT SPECIFIED  Final   Special Requests   Final    BOTTLES DRAWN AEROBIC AND ANAEROBIC Blood Culture adequate volume   Culture   Final    NO GROWTH 5 DAYS Performed at  [facility], [address] [place], [place] [address]    Report Status 10/28/2023 FINAL  Final  Culture, blood (Routine X 2) w Reflex to ID Panel     Status: None   Collection Time: 10/23/23 10:15 PM   Specimen: BLOOD  Result Value Ref Range Status   Specimen Description BLOOD SITE NOT SPECIFIED  Final   Special Requests   Final    BOTTLES DRAWN AEROBIC AND ANAEROBIC Blood Culture adequate volume   Culture   Final    NO GROWTH 5 DAYS Performed at [facility], [address] [place], [place] [address]    Report Status 10/28/2023 FINAL  Final   Assessment/Plan # Complicated MSSA bacteremia - 4/22 blood cx cleared  - 4/15 TTE and 4/23 TEE negative for endocarditis   # Left elbow septic  bursitis - 4/22 s/p I and D with hardware removal with one screw remaining. Or Cx MSSA and MSSE  # Lumbar epidural abscess  - no surgical intervention done, Nsx closely following   Plan -cefazolin  dosing changed to q8 from q12 dosing based on normal creatinine  -Fu after MRI done ( June 11)  on June 23 rd and plan to stop vs switch to PO cefadroxil based on MRI findings. Daughter will ask to fax the results to our office.   [name] Diagnosis: complicated MSSA infection, epidural abscess, left elbow septic bursitis   Culture Result: MSSA  Allergies  Allergen Reactions   Levofloxacin Other (See Comments)    REACTION: hands pealed    [name] Orders Discharge antibiotics to be given via PICC line Discharge antibiotics: Cefazolin  2g iv q 8 hrs  Per pharmacy protocol End Date: 6/23  [facility] Care Per Protocol:  Home health RN for IV administration and teaching; PICC line care and labs.    Labs weekly while on IV antibiotics: X__ CBC with differential X__ BMP __ CMP X__ CRP X__ ESR __ Vancomycin  trough __ CK  __ Please pull PIC at completion of IV antibiotics X__ Please leave PIC in place until doctor has seen patient or been notified  Fax weekly labs to [phone]  Clinic Follow Up Appt:   # Medication management  - 6/2 2.4/8.5/53, cr 0.91, ESR 7, CRP2  # PICC  - no concerns   # MDS w thrombocytopenia  - on retacrit  every 2 weeks  - Follows Oncology   ------------------------ 12/24/23 id clinic assessment Patient doing well no concern today. 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  4/22 bcx cleared. Some hardware remains in elbow (left) There is a slight redness/swelling medio-posterioly no drainage for 24 hours Switched to oral abx cefadroxil last week Picc was on rue  ?new cellulitis unrelated vs hardware related infection breakthrough  I don't think this is related to switching the vanc to cefadroxil  Never had gout and it doesn't appear to be that  However, will give 7 days linezolid (I am aware of mds) this should be short enough duration to not worry about myelosuppression  At 7 days after can restart cefadroxil  Advise to follow up with ortho  Call us /go to ER if fever, chill, and rapidly spreading readness/swelling   Duration of prolong PO cefadroxil at this time I am not as clear (I don't have access to the lumbar mri) and I don't know the exact conversation between dr Dea and ortho so will defer final decision to her   6/16 labs showed: Cbc 2.6/9.5/37; cr 1.1 Esr 2 Crp <1 (10)    Follow up 1-2 weeks with dr Dea  Constance ONEIDA Passer, MD [facility] 12/24/2023, 3:52 PM

## 2023-12-24 NOTE — Patient Instructions (Signed)
 Please make visit with dr Celena this week if possible   Hold cefadroxil for now; start linezolid 600 mg twice a day for 1 week. This medication while it is know to have bone marrow suppression, it doesn't happen before 2 weeks Restart cefadroxil after linezolid finish   Go to ER and also let our clinic know via mychart if the redness/pain rapidly spread or fever, chill   See dr Dea in 1-2 weeks

## 2023-12-25 ENCOUNTER — Ambulatory Visit: Admitting: Podiatry

## 2023-12-25 DIAGNOSIS — L821 Other seborrheic keratosis: Secondary | ICD-10-CM | POA: Diagnosis not present

## 2023-12-25 DIAGNOSIS — C44219 Basal cell carcinoma of skin of left ear and external auricular canal: Secondary | ICD-10-CM | POA: Diagnosis not present

## 2023-12-25 DIAGNOSIS — H353221 Exudative age-related macular degeneration, left eye, with active choroidal neovascularization: Secondary | ICD-10-CM | POA: Diagnosis not present

## 2023-12-25 DIAGNOSIS — D485 Neoplasm of uncertain behavior of skin: Secondary | ICD-10-CM | POA: Diagnosis not present

## 2023-12-25 DIAGNOSIS — S50312A Abrasion of left elbow, initial encounter: Secondary | ICD-10-CM | POA: Diagnosis not present

## 2023-12-25 DIAGNOSIS — L570 Actinic keratosis: Secondary | ICD-10-CM | POA: Diagnosis not present

## 2023-12-25 DIAGNOSIS — Z85828 Personal history of other malignant neoplasm of skin: Secondary | ICD-10-CM | POA: Diagnosis not present

## 2023-12-25 DIAGNOSIS — H401130 Primary open-angle glaucoma, bilateral, stage unspecified: Secondary | ICD-10-CM | POA: Diagnosis not present

## 2023-12-25 DIAGNOSIS — M5416 Radiculopathy, lumbar region: Secondary | ICD-10-CM | POA: Diagnosis not present

## 2023-12-25 DIAGNOSIS — H43812 Vitreous degeneration, left eye: Secondary | ICD-10-CM | POA: Diagnosis not present

## 2023-12-25 DIAGNOSIS — H353111 Nonexudative age-related macular degeneration, right eye, early dry stage: Secondary | ICD-10-CM | POA: Diagnosis not present

## 2023-12-25 DIAGNOSIS — Z79899 Other long term (current) drug therapy: Secondary | ICD-10-CM | POA: Diagnosis not present

## 2023-12-25 DIAGNOSIS — C4442 Squamous cell carcinoma of skin of scalp and neck: Secondary | ICD-10-CM | POA: Diagnosis not present

## 2023-12-25 DIAGNOSIS — G8929 Other chronic pain: Secondary | ICD-10-CM | POA: Diagnosis not present

## 2023-12-26 DIAGNOSIS — M86022 Acute hematogenous osteomyelitis, left humerus: Secondary | ICD-10-CM | POA: Diagnosis not present

## 2023-12-26 DIAGNOSIS — M86132 Other acute osteomyelitis, left radius and ulna: Secondary | ICD-10-CM | POA: Diagnosis not present

## 2023-12-27 ENCOUNTER — Ambulatory Visit: Admitting: Podiatry

## 2023-12-27 ENCOUNTER — Inpatient Hospital Stay

## 2023-12-27 ENCOUNTER — Telehealth: Payer: Self-pay | Admitting: Internal Medicine

## 2023-12-27 VITALS — BP 140/68 | HR 87 | Temp 98.2°F | Resp 16

## 2023-12-27 DIAGNOSIS — D4620 Refractory anemia with excess of blasts, unspecified: Secondary | ICD-10-CM | POA: Diagnosis not present

## 2023-12-27 DIAGNOSIS — D469 Myelodysplastic syndrome, unspecified: Secondary | ICD-10-CM

## 2023-12-27 LAB — CBC WITH DIFFERENTIAL (CANCER CENTER ONLY)
Abs Immature Granulocytes: 0.01 10*3/uL (ref 0.00–0.07)
Basophils Absolute: 0 10*3/uL (ref 0.0–0.1)
Basophils Relative: 1 %
Eosinophils Absolute: 0 10*3/uL (ref 0.0–0.5)
Eosinophils Relative: 1 %
HCT: 30.4 % — ABNORMAL LOW (ref 39.0–52.0)
Hemoglobin: 9.7 g/dL — ABNORMAL LOW (ref 13.0–17.0)
Immature Granulocytes: 1 %
Lymphocytes Relative: 38 %
Lymphs Abs: 0.8 10*3/uL (ref 0.7–4.0)
MCH: 26 pg (ref 26.0–34.0)
MCHC: 31.9 g/dL (ref 30.0–36.0)
MCV: 81.5 fL (ref 80.0–100.0)
Monocytes Absolute: 0.4 10*3/uL (ref 0.1–1.0)
Monocytes Relative: 20 %
Neutro Abs: 0.9 10*3/uL — ABNORMAL LOW (ref 1.7–7.7)
Neutrophils Relative %: 39 %
Platelet Count: 27 10*3/uL — ABNORMAL LOW (ref 150–400)
RBC: 3.73 MIL/uL — ABNORMAL LOW (ref 4.22–5.81)
RDW: 17.5 % — ABNORMAL HIGH (ref 11.5–15.5)
WBC Count: 2.2 10*3/uL — ABNORMAL LOW (ref 4.0–10.5)
nRBC: 0 % (ref 0.0–0.2)

## 2023-12-27 LAB — CMP (CANCER CENTER ONLY)
ALT: <5 U/L (ref 0–44)
AST: 14 U/L — ABNORMAL LOW (ref 15–41)
Albumin: 4 g/dL (ref 3.5–5.0)
Alkaline Phosphatase: 57 U/L (ref 38–126)
Anion gap: 7 (ref 5–15)
BUN: 17 mg/dL (ref 8–23)
CO2: 24 mmol/L (ref 22–32)
Calcium: 9.1 mg/dL (ref 8.9–10.3)
Chloride: 108 mmol/L (ref 98–111)
Creatinine: 1.08 mg/dL (ref 0.61–1.24)
GFR, Estimated: >60 mL/min (ref >60)
Glucose, Bld: 125 mg/dL — ABNORMAL HIGH (ref 70–99)
Potassium: 4.1 mmol/L (ref 3.5–5.1)
Sodium: 139 mmol/L (ref 135–145)
Total Bilirubin: 0.7 mg/dL (ref 0.0–1.2)
Total Protein: 7 g/dL (ref 6.5–8.1)

## 2023-12-27 MED ORDER — EPOETIN ALFA 20000 UNIT/ML IJ SOLN
20000.0000 [IU] | Freq: Once | INTRAMUSCULAR | Status: AC
  Administered 2023-12-27: 20000 [IU] via SUBCUTANEOUS
  Filled 2023-12-27: qty 1

## 2023-12-27 NOTE — Telephone Encounter (Signed)
 Copied from CRM [id number]. Topic: Clinical - Home Health Verbal Orders >> Dec 27, 2023  4:43 PM Gennette ORN wrote: Caller/Agency: [facility]  Callback Number: [phone]  Service Requested: Physical Therapy Frequency: 1 time a week for 4 weeks  Any new concerns about the patient? No

## 2024-01-01 DIAGNOSIS — G061 Intraspinal abscess and granuloma: Secondary | ICD-10-CM | POA: Diagnosis not present

## 2024-01-02 DIAGNOSIS — M720 Palmar fascial fibromatosis [Dupuytren]: Secondary | ICD-10-CM | POA: Diagnosis not present

## 2024-01-02 DIAGNOSIS — M545 Low back pain, unspecified: Secondary | ICD-10-CM | POA: Diagnosis not present

## 2024-01-02 DIAGNOSIS — I739 Peripheral vascular disease, unspecified: Secondary | ICD-10-CM | POA: Diagnosis not present

## 2024-01-02 DIAGNOSIS — D469 Myelodysplastic syndrome, unspecified: Secondary | ICD-10-CM | POA: Diagnosis not present

## 2024-01-02 DIAGNOSIS — G061 Intraspinal abscess and granuloma: Secondary | ICD-10-CM | POA: Diagnosis not present

## 2024-01-02 DIAGNOSIS — H353122 Nonexudative age-related macular degeneration, left eye, intermediate dry stage: Secondary | ICD-10-CM | POA: Diagnosis not present

## 2024-01-02 DIAGNOSIS — I10 Essential (primary) hypertension: Secondary | ICD-10-CM | POA: Diagnosis not present

## 2024-01-02 DIAGNOSIS — K5900 Constipation, unspecified: Secondary | ICD-10-CM | POA: Diagnosis not present

## 2024-01-02 DIAGNOSIS — M009 Pyogenic arthritis, unspecified: Secondary | ICD-10-CM | POA: Diagnosis not present

## 2024-01-02 DIAGNOSIS — N400 Enlarged prostate without lower urinary tract symptoms: Secondary | ICD-10-CM | POA: Diagnosis not present

## 2024-01-02 DIAGNOSIS — B9561 Methicillin susceptible Staphylococcus aureus infection as the cause of diseases classified elsewhere: Secondary | ICD-10-CM | POA: Diagnosis not present

## 2024-01-02 DIAGNOSIS — G8929 Other chronic pain: Secondary | ICD-10-CM | POA: Diagnosis not present

## 2024-01-02 NOTE — Telephone Encounter (Signed)
Okay with me. Thanks 

## 2024-01-03 ENCOUNTER — Inpatient Hospital Stay: Payer: PPO

## 2024-01-03 ENCOUNTER — Ambulatory Visit: Admitting: Internal Medicine

## 2024-01-03 ENCOUNTER — Other Ambulatory Visit: Payer: Self-pay

## 2024-01-03 VITALS — BP 155/73 | HR 89 | Temp 98.0°F | Wt 153.0 lb

## 2024-01-03 DIAGNOSIS — M7032 Other bursitis of elbow, left elbow: Secondary | ICD-10-CM

## 2024-01-03 DIAGNOSIS — R7881 Bacteremia: Secondary | ICD-10-CM | POA: Diagnosis not present

## 2024-01-03 DIAGNOSIS — T847XXD Infection and inflammatory reaction due to other internal orthopedic prosthetic devices, implants and grafts, subsequent encounter: Secondary | ICD-10-CM

## 2024-01-03 DIAGNOSIS — B9561 Methicillin susceptible Staphylococcus aureus infection as the cause of diseases classified elsewhere: Secondary | ICD-10-CM

## 2024-01-03 DIAGNOSIS — M869 Osteomyelitis, unspecified: Secondary | ICD-10-CM

## 2024-01-03 NOTE — Telephone Encounter (Signed)
Verbals left today for [employer]

## 2024-01-03 NOTE — Progress Notes (Signed)
 Patient Active Problem List   Diagnosis Date Noted   Intertrigo 12/16/2023   MSSA (methicillin susceptible Staphylococcus aureus) infection 12/11/2023   Swelling of lower leg 11/17/2023   PICC (peripherally inserted central catheter) in place 11/17/2023   Medication management 11/17/2023   Erysipelas of both lower extremities 11/12/2023   Urinary incontinence 11/12/2023   Septic bursitis of elbow 11/06/2023   Abscess of epidural space of spine due to bacteria 10/25/2023   Epidural abscess 10/25/2023   MSSA bacteremia 10/22/2023   Hypokalemia 10/21/2023   Acute on chronic pain of left elbow concerning for septic arthritis 10/21/2023   Spinal stenosis of lumbar region 10/16/2023   Pseudophakia of both eyes 02/15/2022   MDS (myelodysplastic syndrome) (HCC) 12/25/2021   Abnormal EKG 11/18/2021   Thrombocytopenia (HCC) 11/18/2021   Lumbar spondylosis 07/22/2021   Lumbar radiculopathy 03/24/2021   S/P total knee arthroplasty, right 11/09/2020   Weight loss 08/30/2020   Complication associated with orthopedic device (HCC) 06/10/2020   Diastolic dysfunction 10/27/2019   Exudative age-related macular degeneration of left eye with active choroidal neovascularization (HCC) 10/15/2019   Intermediate stage nonexudative age-related macular degeneration of left eye 10/15/2019   Early stage nonexudative age-related macular degeneration of right eye 10/15/2019   Pseudophakia 10/15/2019   Posterior vitreous detachment of left eye 10/15/2019   GERD (gastroesophageal reflux disease) 11/13/2018   Coronary artery disease 10/24/2018   Claudication (HCC) 02/22/2018   Anemia 08/28/2017   DOE (dyspnea on exertion) 07/31/2017   Fatigue 07/31/2017   Essential hypertension 11/29/2016   Smoking greater than 30 pack years 02/02/2016   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) 02/28/2013   Ingrowing toenail of left foot 08/29/2012   Allergic rhinitis 10/10/2011   Dupuytren's contracture of hand 07/05/2011    Vitamin B12 deficiency 12/23/2010   BPH (benign prostatic hyperplasia) 12/23/2010   Low back pain 07/27/2009   TOBACCO USE, QUIT 07/27/2009   Diverticulosis of colon 03/28/2009   RLS (restless legs syndrome) 10/28/2008   SKIN RASH, ALLERGIC 10/01/2008   Edema 10/01/2008   Neuropathy 04/22/2008   Vitamin D  deficiency 01/21/2008   INSOMNIA, CHRONIC 12/23/2007   Osteoarthritis 12/23/2007   CAROTID BRUIT, LEFT 12/23/2007   Current Outpatient Medications on File Prior to Visit  Medication Sig Dispense Refill   acetaminophen  (TYLENOL ) 500 MG tablet Take 1,000 mg by mouth every 6 (six) hours as needed for mild pain (pain score 1-3) or moderate pain (pain score 4-6).     amLODipine  (NORVASC ) 5 MG tablet TAKE 1 TABLET BY MOUTH DAILY 90 tablet 3   calcium  carbonate (TUMS EX) 750 MG chewable tablet Chew 1,500 mg by mouth daily as needed for heartburn.     carbidopa -levodopa  (SINEMET  IR) 25-100 MG tablet For breakthrough restless leg, you can take 1 tablet at bedtime.  OK to take extra tab as needed during the day. 100 tablet 3   cefadroxil  (DURICEF) 500 MG capsule Take 2 capsules (1,000 mg total) by mouth 2 (two) times daily. 120 capsule 0   Cholecalciferol  1000 UNITS tablet Take 3,000 Units by mouth daily.     clotrimazole -betamethasone  (LOTRISONE ) cream Apply 1 Application topically daily. 60 g 0   Cyanocobalamin  (VITAMIN B-12) 1000 MCG SUBL DISSOLVE 2 TABLETS IN MOUTH EVERY DAY 180 tablet 1   finasteride  (PROSCAR ) 5 MG tablet TAKE 1 TABLET (5 MG TOTAL) BY MOUTH DAILY. 90 tablet 1   gabapentin  (NEURONTIN ) 300 MG capsule Take 1 capsule (300 mg total) by mouth 3 (three) times daily.  ibuprofen (ADVIL) 200 MG tablet Take 400 mg by mouth every 6 (six) hours as needed for mild pain (pain score 1-3) or moderate pain (pain score 4-6).     magnesium  hydroxide (MILK OF MAGNESIA) 400 MG/5ML suspension Take 15 mLs by mouth daily as needed for mild constipation.     meclizine  (ANTIVERT ) 12.5 MG tablet TAKE  1 TABLET BY MOUTH 3 TIMES DAILY AS NEEDED FOR DIZZINESS. 90 tablet 2   Multiple Vitamins-Minerals (PRESERVISION AREDS PO) Take 2 tablets by mouth 2 (two) times daily.     ondansetron  (ZOFRAN ) 4 MG tablet Take 1 tablet (4 mg total) by mouth every 6 (six) hours as needed for nausea or vomiting. 30 tablet 0   Polyethyl Glycol-Propyl Glycol (SYSTANE OP) Place 1 drop into both eyes 2 (two) times daily as needed (dry eyes). [facility]     pravastatin  (PRAVACHOL ) 20 MG tablet TAKE 1 TABLET (20 MG TOTAL) BY MOUTH DAILY. 90 tablet 1   rOPINIRole  (REQUIP ) 2 MG tablet TAKE 1 TABLET BY MOUTH AT AT NOON AND 1 TAB AT 4PM AND 1 TAB AT BEDTIME (Patient taking differently: Take 1 mg by mouth in the morning, at noon, in the evening, and at bedtime.) 270 tablet 1   tamsulosin  (FLOMAX ) 0.4 MG CAPS capsule TAKE 1 CAPSULE (0.4 MG TOTAL) BY MOUTH IN THE MORNING. 90 capsule 1   terazosin  (HYTRIN ) 2 MG capsule TAKE 1 CAPSULE BY MOUTH EVERY NIGHT AT BEDTIME 90 capsule 1   Oxycodone  HCl 10 MG TABS Take 1 tablet (10 mg total) by mouth 5 (five) times daily for 5 days. 25 tablet 0   torsemide  (DEMADEX ) 100 MG tablet Take 0.5-1 tablets (50-100 mg total) by mouth daily. (Patient not taking: Reported on 01/03/2024) 90 tablet 1   No current facility-administered medications on file prior to visit.   Subjective: Discussed the use of AI scribe software for clinical note transcription with the patient, who gave verbal consent to proceed.   83 year old male with prior history of HTN, CAD, MDS with thrombocytopenia, depression BCC, BPH, melanoma, GERD, DDD, OA, TIA, h/o Tobacco/alcohol  abuse who is here for HFU 4/20-4/25 for left elbow septic bursitis and lumbar epidural abscess.  Patient had an open fracture of his left elbow in 2007. Approximately 4 years ago one of the pins came loose and it was taken out and he had limited range of motion in his left elbow with inability to fully flex and extend and unable to function with his left hand.  Left Elbow x-ray as below. 4/20 left elbow SF Gram stain with GPC. Cx with MSSA.  4/20 blood culture with MSSA.  4/22 underwent I&D with removal of hardware, 1 screw remaining ( no OP note available). OR cx with MSSA and MSSE. TTE and TEE negative for vegetations or endocarditis.  MRI L spine septic arthritis of left L5-S1.  Left dorsal epidural abscess extending from L4-S1, phlegmonous change in the left psoas.  No surgical intervention per neurosurgery.  Also required PRBC transfusion for anemia. Discharged on 4/25 to complete 8 weeks course of IV cefazolin  through 6/17  5/15 Patient is accompanied by his daughter surgery.  Getting IV antibiotics through PICC without any concerns related to antibiotics or PICC.  Cefazolin  was recently reduced to 1 g every 12 dosing due to elevated kidney function.  Daughter reports increased swelling and redness in his left leg compared to right leg for the last few days history of chronic intermittent swelling of bilateral  legs.  Seen by PCP on Monday and was started on a 7-day course of doxycycline .  Patient is also on diuretic. Saw Dr. Celena yesterday and discharged from his care as left elbow has healed. Also saw Dr Mavis post discharge and, wants to repeat MRI end of month to see if improvement and needs for +/- surgery.  6/10 Accompanied by daughter. Getting IV cefazolin  through PICC without any concerns. Left elbow wound has healed. He has experienced significant improvement in mobility since the last visit three weeks ago. He is now able to walk to the bathroom and bed with the aid of a walker and can walk to the car instead of using a wheelchair. However, he continues to use a wheelchair for longer distances due to balance issues.  He experiences significant back pain, particularly when lying down, rating it close to ten on a scale of one to ten. The pain is alleviated when sitting. He is under pain management with oxycodone , which he takes as needed, usually  less than the prescribed five times a day. He is scheduled for an MRI 6/11.  Next fu with Dr Mavis on July 1. Has one more fu with Dr Celena. Seen by PCP on 6/3, leg swelling has improved since last visit and has stopped lasix  recently due to need to frequently urinate at night.    ---------- 12/24/23 id clinic f/u Left elbow swelling/pain 1 day See a&p for detail Cancelled ortho f/u appointent    01/03/24 id clinic f/u Last visit gave him 1 weeks linezolid  due to acute left elbow swelling on cefadroxil  Received mri of his back and spoke with dr Dea --plan to treat long term given concern of screw/hardware left No complaint today Spoke with his daughter on the phone as well He saw dr Mavis who is happy with his progress and how the lumbar spine mri looks  Review of Systems: All systems reviewed and negative except as above.  Denies fever, chills, sweats.  Denies nausea, vomiting or diarrhea.  Past Medical History:  Diagnosis Date   Alcoholism (HCC)    Dry 13 years   Basal cell carcinoma    Right Chest   BPH (benign prostatic hyperplasia)    Diverticulosis of colon    Dizzy spells    none recent   DJD (degenerative joint disease)    lower back   Elbow fracture, left 2009   Dr Celena   FRACTURE, [place], LEFT 12/23/2007   Qualifier: Diagnosis of   By: Georgian ROSALEA CHARM Lamar        GERD (gastroesophageal reflux disease)    Glaucoma    History of TIAs 1 yrs ago   Hyperkeratosis    LBP (low back pain)    Macular degeneration    left wet right dry sees dr elner   Melanoma [facility]) 2009   x3 Dr Amy [place]   Osteoarthritis    Restless leg syndrome    Thrombocytopenia (HCC) 11/18/2021   Tinnitus    Tobacco abuse    Uses walker    Wears glasses    reading   Wears partial dentures    upper   Past Surgical History:  Procedure Laterality Date   CATARACT EXTRACTION Bilateral 2018   HARDWARE REMOVAL Left 07/09/2020   Procedure: Left elbow hardware removal;  Surgeon: Sharl Selinda Dover, MD;  Location[facility];  Service: Orthopedics;  Laterality: Left;  60 mins   HARDWARE REMOVAL Left 10/23/2023   Procedure: REMOVAL,  HARDWARE;  Surgeon: Celena Sharper, MD;  Location: [facility] OR;  Service: Orthopedics;  Laterality: Left;   IRRIGATION AND DEBRIDEMENT ELBOW Left 10/23/2023   Procedure: IRRIGATION AND DEBRIDEMENT ELBOW;  Surgeon: Celena Sharper, MD;  Location: [facility] OR;  Service: Orthopedics;  Laterality: Left;   mda     Dr. Elner wet injection   MELANOMA EXCISION  2009   x3   RECONSTRUCTION MEDIAL COLLATERAL LIGAMENT ELBOW W/ TENDON GRAFT  2009   Left, Dr Celena   TONSILLECTOMY  as child   TOTAL KNEE ARTHROPLASTY Right 11/09/2020   Procedure: TOTAL KNEE ARTHROPLASTY;  Surgeon: Ernie Cough, MD;  Location: [facility] ORS;  Service: Orthopedics;  Laterality: Right;   TRANSESOPHAGEAL ECHOCARDIOGRAM (CATH LAB) N/A 10/24/2023   Procedure: TRANSESOPHAGEAL ECHOCARDIOGRAM;  Surgeon: Jeffrie Oneil BROCKS, MD;  Location: [facility] INVASIVE CV LAB;  Service: Cardiovascular;  Laterality: N/A;    Social History   Tobacco Use   Smoking status: Former    Current packs/day: 0.00    Average packs/day: 3.0 packs/day for 38.0 years (114.0 ttl pk-yrs)    Types: Cigarettes    Start date: 31    Quit date: 16    Years since quitting: 39.5   Smokeless tobacco: Never   Tobacco comments:    states he quit May 15 1985  Vaping Use   Vaping status: Never Used  Substance Use Topics   Alcohol  use: No    Comment: PREVIOUS - DRY 50yrs   Drug use: Not Currently    Family History  Problem Relation Age of Onset   Cancer Mother        ?breast   Cancer Father        Lung    Allergies  Allergen Reactions   Levofloxacin Other (See Comments)    REACTION: hands pealed    Health Maintenance  Topic Date Due   COVID-19 Vaccine (4 - 2024-25 season) 03/04/2023   INFLUENZA VACCINE  02/01/2024   Medicare Annual Wellness (AWV)  07/19/2024   DTaP/Tdap/Td (3 - Td or Tdap) 05/24/2032    Pneumococcal Vaccine: 50+ Years  Completed   Hepatitis B Vaccines  Aged Out   HPV VACCINES  Aged Out   Meningococcal B Vaccine  Aged Out   Zoster Vaccines- Shingrix  Discontinued   Objective: Wt 153 lb (69.4 kg)   BMI 21.34 kg/m   Physical Exam Constitutional:      Appearance: Normal appearance.  HENT:     Head: Normocephalic and atraumatic.      Mouth: Mucous membranes are moist.  Eyes:    Conjunctiva/sclera: Conjunctivae normal.     Pupils: Pupils are equal, round, and b/l symmetrical    Cardiovascular:     Rate and Rhythm: Normal rate and regular rhythm.     Heart sounds:  Pulmonary:     Effort: Pulmonary effort is normal.     Breath sounds:  Abdominal:     General: Non distended     Palpations:   Musculoskeletal/skin: Unable to extend elbow beyond 90 degree (chronic); no tenderness/swelling/redness   Neurological:     General: grossly non focal     Mental Status: awake, alert and oriented to person, place, and time.   Psychiatric:        Mood and Affect: Mood normal.   Lab Results Lab Results  Component Value Date   WBC 2.2 (L) 12/27/2023   HGB 9.7 (L) 12/27/2023   HCT 30.4 (L) 12/27/2023   MCV 81.5 12/27/2023   PLT 27 (L)  12/27/2023    Lab Results  Component Value Date   CREATININE 1.08 12/27/2023   BUN 17 12/27/2023   NA 139 12/27/2023   K 4.1 12/27/2023   CL 108 12/27/2023   CO2 24 12/27/2023    Lab Results  Component Value Date   ALT <5 12/27/2023   AST 14 (L) 12/27/2023   ALKPHOS 57 12/27/2023   BILITOT 0.7 12/27/2023        Component Value Date/Time   CRP 9.3 (H) 10/21/2023 1127   Lab Results  Component Value Date   ESRSEDRATE 12 10/21/2023    Microbiology Results for orders placed or performed during the hospital encounter of 10/21/23  Blood Cultures x 2 sites     Status: Abnormal   Collection Time: 10/21/23 11:27 AM   Specimen: Site Not Specified; Blood  Result Value Ref Range Status   Specimen Description   Final     SITE NOT SPECIFIED Performed at [facility], [address], [place], [place] [address]    Special Requests   Final    BOTTLES DRAWN AEROBIC AND ANAEROBIC Blood Culture adequate volume Performed at [facility], [address] [place], [place] [address]    Culture  Setup Time   Final    GRAM POSITIVE COCCI IN CLUSTERS IN BOTH AEROBIC AND ANAEROBIC BOTTLES CRITICAL RESULT CALLED TO, READ BACK BY AND VERIFIED WITH: PHARMD G ABBOTT 10/22/2023 @ 0610 BY AB Performed at [facility], [address] [place], [place] [address]    Culture STAPHYLOCOCCUS AUREUS (A)  Final   Report Status 10/24/2023 FINAL  Final   Organism ID, Bacteria STAPHYLOCOCCUS AUREUS  Final      Susceptibility   Staphylococcus aureus - MIC*    CIPROFLOXACIN >=8 RESISTANT Resistant     ERYTHROMYCIN >=8 RESISTANT Resistant     GENTAMICIN <=0.5 SENSITIVE Sensitive     OXACILLIN 0.5 SENSITIVE Sensitive     TETRACYCLINE <=1 SENSITIVE Sensitive     VANCOMYCIN  1 SENSITIVE Sensitive     TRIMETH /SULFA  >=320 RESISTANT Resistant     CLINDAMYCIN  <=0.25 SENSITIVE Sensitive     RIFAMPIN <=0.5 SENSITIVE Sensitive     Inducible Clindamycin  NEGATIVE Sensitive     LINEZOLID  2 SENSITIVE Sensitive     * STAPHYLOCOCCUS AUREUS  Blood Culture ID Panel (Reflexed)     Status: Abnormal   Collection Time: 10/21/23 11:27 AM  Result Value Ref Range Status   Enterococcus faecalis NOT DETECTED NOT DETECTED Final   Enterococcus Faecium NOT DETECTED NOT DETECTED Final   Listeria monocytogenes NOT DETECTED NOT DETECTED Final   Staphylococcus species DETECTED (A) NOT DETECTED Final    Comment: CRITICAL RESULT CALLED TO, READ BACK BY AND VERIFIED WITH: PHARMD G ABBOTT 10/22/2023 @ 0610 BY AB    Staphylococcus aureus (BCID) DETECTED (A) NOT DETECTED Final    Comment: CRITICAL RESULT CALLED TO, READ BACK BY AND VERIFIED WITH: PHARMD G ABBOTT 10/22/2023 @ 0610 BY AB    Staphylococcus epidermidis NOT  DETECTED NOT DETECTED Final   Staphylococcus lugdunensis NOT DETECTED NOT DETECTED Final   Streptococcus species NOT DETECTED NOT DETECTED Final   Streptococcus agalactiae NOT DETECTED NOT DETECTED Final   Streptococcus pneumoniae NOT DETECTED NOT DETECTED Final   Streptococcus pyogenes NOT DETECTED NOT DETECTED Final   A.calcoaceticus-baumannii NOT DETECTED NOT DETECTED Final   Bacteroides fragilis NOT DETECTED NOT DETECTED Final   Enterobacterales NOT DETECTED NOT DETECTED Final   Enterobacter cloacae complex NOT DETECTED NOT  DETECTED Final   Escherichia coli NOT DETECTED NOT DETECTED Final   Klebsiella aerogenes NOT DETECTED NOT DETECTED Final   Klebsiella oxytoca NOT DETECTED NOT DETECTED Final   Klebsiella pneumoniae NOT DETECTED NOT DETECTED Final   Proteus species NOT DETECTED NOT DETECTED Final   Salmonella species NOT DETECTED NOT DETECTED Final   Serratia marcescens NOT DETECTED NOT DETECTED Final   Haemophilus influenzae NOT DETECTED NOT DETECTED Final   Neisseria meningitidis NOT DETECTED NOT DETECTED Final   Pseudomonas aeruginosa NOT DETECTED NOT DETECTED Final   Stenotrophomonas maltophilia NOT DETECTED NOT DETECTED Final   Candida albicans NOT DETECTED NOT DETECTED Final   Candida auris NOT DETECTED NOT DETECTED Final   Candida glabrata NOT DETECTED NOT DETECTED Final   Candida krusei NOT DETECTED NOT DETECTED Final   Candida parapsilosis NOT DETECTED NOT DETECTED Final   Candida tropicalis NOT DETECTED NOT DETECTED Final   Cryptococcus neoformans/gattii NOT DETECTED NOT DETECTED Final   Meth resistant mecA/C and MREJ NOT DETECTED NOT DETECTED Final    Comment: Performed at [facility], [address] [place], [place] [address]  Blood Cultures x 2 sites     Status: Abnormal   Collection Time: 10/21/23 12:19 PM   Specimen: BLOOD  Result Value Ref Range Status   Specimen Description   Final    BLOOD BLOOD RIGHT ARM Performed at [facility],  [address], [place], [place] [address]    Special Requests   Final    BOTTLES DRAWN AEROBIC AND ANAEROBIC Blood Culture adequate volume Performed at [facility], [address] [place], [place] [address]    Culture  Setup Time   Final    GRAM POSITIVE COCCI IN CLUSTERS ANAEROBIC BOTTLE ONLY CRITICAL VALUE NOTED.  VALUE IS CONSISTENT WITH PREVIOUSLY REPORTED AND CALLED VALUE.    Culture (A)  Final    STAPHYLOCOCCUS AUREUS SUSCEPTIBILITIES PERFORMED ON PREVIOUS CULTURE WITHIN THE LAST 5 DAYS. Performed at [facility], [address] [place], [place] [address]    Report Status 10/24/2023 FINAL  Final  Body fluid culture w Gram Stain     Status: None   Collection Time: 10/21/23  1:59 PM   Specimen: Synovium; Synovial Fluid  Result Value Ref Range Status   Specimen Description   Final    SYNOVIAL Performed at [facility], [address], [place], [place] [address]    Special Requests   Final    NONE LEFT ELBOW Performed at [facility], [address], [place], [place] [address]    Gram Stain   Final    ABUNDANT WBC PRESENT,BOTH PMN AND MONONUCLEAR ABUNDANT GRAM POSITIVE COCCI Performed at [facility], [address], [place], [place] [address]    Culture ABUNDANT STAPHYLOCOCCUS AUREUS  Final   Report Status 10/23/2023 FINAL  Final   Organism ID, Bacteria STAPHYLOCOCCUS AUREUS  Final      Susceptibility   Staphylococcus aureus - MIC*    CIPROFLOXACIN >=8 RESISTANT Resistant     ERYTHROMYCIN >=8 RESISTANT Resistant     GENTAMICIN <=0.5 SENSITIVE Sensitive     OXACILLIN 0.5 SENSITIVE Sensitive     TETRACYCLINE <=1 SENSITIVE Sensitive     VANCOMYCIN  <=0.5 SENSITIVE Sensitive     TRIMETH /SULFA  >=320 RESISTANT Resistant     CLINDAMYCIN  <=0.25 SENSITIVE Sensitive     RIFAMPIN <=0.5 SENSITIVE Sensitive     Inducible Clindamycin  NEGATIVE Sensitive  LINEZOLID  2 SENSITIVE Sensitive     * ABUNDANT  STAPHYLOCOCCUS AUREUS  Gram stain     Status: None   Collection Time: 10/21/23  1:59 PM   Specimen: Synovium; Body Fluid  Result Value Ref Range Status   Specimen Description SYNOVIAL  Final   Special Requests LEFT ELBOW  Final   Gram Stain   Final    ABUNDANT WBC SEEN GRAM POSITIVE COCCI Gram Stain Report Called to,Read Back By and Verified With: FRANCHOT BROCKS RN AT 1529 ON 10/21/2023 BY PRUDY POUR Performed at [facility], [address], [place], [place] [address]    Report Status 10/21/2023 FINAL  Final  Aerobic/Anaerobic Culture w Gram Stain (surgical/deep wound)     Status: None   Collection Time: 10/23/23  2:37 PM   Specimen: Synovial, Left Elbow; Body Fluid  Result Value Ref Range Status   Specimen Description SYNOVIAL  Final   Special Requests SWAB OF LEFT ELBOW SYNOVIAL FLUID  Final   Gram Stain   Final    ABUNDANT WBC PRESENT, PREDOMINANTLY PMN RARE GRAM POSITIVE COCCI IN PAIRS Gram Stain Report Called to,Read Back By and Verified With: RN B. HICKLIN  [id number]@ 2036 FH    Culture   Final    MODERATE STAPHYLOCOCCUS AUREUS NO ANAEROBES ISOLATED Performed at [facility], [address], [place], [place] [address]    Report Status 10/28/2023 FINAL  Final   Organism ID, Bacteria STAPHYLOCOCCUS AUREUS  Final      Susceptibility   Staphylococcus aureus - MIC*    CIPROFLOXACIN >=8 RESISTANT Resistant     ERYTHROMYCIN >=8 RESISTANT Resistant     GENTAMICIN <=0.5 SENSITIVE Sensitive     OXACILLIN 0.5 SENSITIVE Sensitive     TETRACYCLINE <=1 SENSITIVE Sensitive     VANCOMYCIN  1 SENSITIVE Sensitive     TRIMETH /SULFA  >=320 RESISTANT Resistant     CLINDAMYCIN  <=0.25 SENSITIVE Sensitive     RIFAMPIN <=0.5 SENSITIVE Sensitive     Inducible Clindamycin  NEGATIVE Sensitive     LINEZOLID  2 SENSITIVE Sensitive     * MODERATE STAPHYLOCOCCUS AUREUS  Aerobic/Anaerobic Culture w Gram Stain (surgical/deep wound)     Status: None   Collection Time: 10/23/23  3:20 PM    Specimen: Soft Tissue, Other  Result Value Ref Range Status   Specimen Description TISSUE  Final   Special Requests OLECRANON BURSA  Final   Gram Stain   Final    NO WBC SEEN RARE GRAM POSITIVE COCCI IN PAIRS Gram Stain Report Called to,Read Back By and Verified With: RN EMERSON BURY [phone] @ [phone] FH    Culture   Final    RARE STAPHYLOCOCCUS AUREUS RARE STAPHYLOCOCCUS EPIDERMIDIS NO ANAEROBES ISOLATED Performed at [facility], [address] [place], [place] [address]    Report Status 10/28/2023 FINAL  Final   Organism ID, Bacteria STAPHYLOCOCCUS AUREUS  Final   Organism ID, Bacteria STAPHYLOCOCCUS EPIDERMIDIS  Final      Susceptibility   Staphylococcus aureus - MIC*    CIPROFLOXACIN >=8 RESISTANT Resistant     ERYTHROMYCIN >=8 RESISTANT Resistant     GENTAMICIN <=0.5 SENSITIVE Sensitive     OXACILLIN 0.5 SENSITIVE Sensitive     TETRACYCLINE <=1 SENSITIVE Sensitive     VANCOMYCIN  <=0.5 SENSITIVE Sensitive     TRIMETH /SULFA  >=320 RESISTANT Resistant     CLINDAMYCIN  <=0.25 SENSITIVE Sensitive     RIFAMPIN <=0.5 SENSITIVE Sensitive     Inducible Clindamycin  NEGATIVE Sensitive  LINEZOLID  2 SENSITIVE Sensitive     * RARE STAPHYLOCOCCUS AUREUS   Staphylococcus epidermidis - MIC*    CIPROFLOXACIN <=0.5 SENSITIVE Sensitive     ERYTHROMYCIN <=0.25 SENSITIVE Sensitive     GENTAMICIN <=0.5 SENSITIVE Sensitive     OXACILLIN <=0.25 SENSITIVE Sensitive     TETRACYCLINE 2 SENSITIVE Sensitive     VANCOMYCIN  2 SENSITIVE Sensitive     TRIMETH /SULFA  <=10 SENSITIVE Sensitive     CLINDAMYCIN  <=0.25 SENSITIVE Sensitive     RIFAMPIN <=0.5 SENSITIVE Sensitive     Inducible Clindamycin  NEGATIVE Sensitive     * RARE STAPHYLOCOCCUS EPIDERMIDIS  Culture, blood (Routine X 2) w Reflex to ID Panel     Status: None   Collection Time: 10/23/23 10:13 PM   Specimen: BLOOD  Result Value Ref Range Status   Specimen Description BLOOD SITE NOT SPECIFIED  Final   Special Requests   Final     BOTTLES DRAWN AEROBIC AND ANAEROBIC Blood Culture adequate volume   Culture   Final    NO GROWTH 5 DAYS Performed at [facility], [address], [place], [place] [address]    Report Status 10/28/2023 FINAL  Final  Culture, blood (Routine X 2) w Reflex to ID Panel     Status: None   Collection Time: 10/23/23 10:15 PM   Specimen: BLOOD  Result Value Ref Range Status   Specimen Description BLOOD SITE NOT SPECIFIED  Final   Special Requests   Final    BOTTLES DRAWN AEROBIC AND ANAEROBIC Blood Culture adequate volume   Culture   Final    NO GROWTH 5 DAYS Performed at [facility], [address] [place], [place] [address]    Report Status 10/28/2023 FINAL  Final     Imaging: Reviewed  6/11 lumbar spine mri         Assessment/Plan # Complicated MSSA bacteremia - 4/22 blood cx cleared  - 4/15 TTE and 4/23 TEE negative for endocarditis   # Left elbow septic  bursitis - 4/22 s/p I and D with hardware removal with one screw remaining. Or Cx MSSA and MSSE  # Lumbar epidural abscess  - no surgical intervention done, Nsx closely following   Plan -cefazolin  dosing changed to q8 from q12 dosing based on normal creatinine  -Fu after MRI done ( June 11)  on June 23 rd and plan to stop vs switch to PO cefadroxil  based on MRI findings. Daughter will ask to fax the results to our office.   [name] Diagnosis: complicated MSSA infection, epidural abscess, left elbow septic bursitis   Culture Result: MSSA  Allergies  Allergen Reactions   Levofloxacin Other (See Comments)    REACTION: hands pealed    [name] Orders Discharge antibiotics to be given via PICC line Discharge antibiotics: Cefazolin  2g iv q 8 hrs  Per pharmacy protocol End Date: 6/23  [facility] Care Per Protocol:  Home health RN for IV administration and teaching; PICC line care and labs.    Labs weekly while on IV antibiotics: X__ CBC with differential X__ BMP __ CMP X__ CRP X__ ESR __ Vancomycin   trough __ CK  __ Please pull PIC at completion of IV antibiotics X__ Please leave PIC in place until doctor has seen patient or been notified  Fax weekly labs to [phone]  Clinic Follow Up Appt:   # Medication management  - 6/2 2.4/8.5/53, cr 0.91, ESR 7, CRP2  # PICC  - no concerns   #  MDS w thrombocytopenia  - on retacrit  every 2 weeks  - Follows Oncology   ------------------------ 12/24/23 id clinic assessment Patient doing well no concern today. 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 4/22 bcx cleared. Some hardware remains in elbow (left) There is a slight redness/swelling medio-posterioly no drainage for 24 hours Switched to oral abx cefadroxil  last week Picc was on rue  ?new cellulitis unrelated vs hardware related infection breakthrough  I don't think this is related to switching the vanc to cefadroxil   Never had gout and it doesn't appear to be that  However, will give 7 days linezolid  (I am aware of mds) this should be short enough duration to not worry about myelosuppression  At 7 days after can restart cefadroxil   Advise to follow up with ortho  Call us /go to ER if fever, chill, and rapidly spreading readness/swelling   Duration of prolong PO cefadroxil  at this time I am not as clear (I don't have access to the lumbar mri) and I don't know the exact conversation between dr Dea and ortho so will defer final decision to her   6/16 labs showed: Cbc 2.6/9.5/37; cr 1.1 Esr 2 Crp <1 (10)  Follow up 1-2 weeks with dr Dea   ---------- 01/03/24 id clinic assessment Left elbow cellulitis resolved Pain improved Still limited rom  Mri reassuring and so is symptoms -- no back pain reported today  Keep on abx for hardware in left elbow still  Restart cefadroxil  1000 mg po bid; finished linezolid  01/02/24   F/u 4-6 weeks with Dr Dea  Constance ONEIDA Passer, MD [facility] 01/03/2024, 3:48 PM

## 2024-01-03 NOTE — Patient Instructions (Signed)
 Please start cefadroxil  1000 mg twice a day   Will need to continue this for at least 6 months if not longer   See dr Dea in 4-6 weeks and will do labs at that time

## 2024-01-10 ENCOUNTER — Inpatient Hospital Stay: Attending: Physician Assistant

## 2024-01-10 ENCOUNTER — Inpatient Hospital Stay

## 2024-01-10 VITALS — BP 148/67 | HR 87 | Temp 98.1°F | Resp 18

## 2024-01-10 DIAGNOSIS — D4620 Refractory anemia with excess of blasts, unspecified: Secondary | ICD-10-CM | POA: Insufficient documentation

## 2024-01-10 DIAGNOSIS — D469 Myelodysplastic syndrome, unspecified: Secondary | ICD-10-CM

## 2024-01-10 DIAGNOSIS — Z79899 Other long term (current) drug therapy: Secondary | ICD-10-CM | POA: Diagnosis not present

## 2024-01-10 LAB — CMP (CANCER CENTER ONLY)
ALT: 5 U/L (ref 0–44)
AST: 14 U/L — ABNORMAL LOW (ref 15–41)
Albumin: 4 g/dL (ref 3.5–5.0)
Alkaline Phosphatase: 66 U/L (ref 38–126)
Anion gap: 4 — ABNORMAL LOW (ref 5–15)
BUN: 17 mg/dL (ref 8–23)
CO2: 28 mmol/L (ref 22–32)
Calcium: 9.1 mg/dL (ref 8.9–10.3)
Chloride: 108 mmol/L (ref 98–111)
Creatinine: 1.03 mg/dL (ref 0.61–1.24)
GFR, Estimated: >60 mL/min (ref >60)
Glucose, Bld: 91 mg/dL (ref 70–99)
Potassium: 4.5 mmol/L (ref 3.5–5.1)
Sodium: 140 mmol/L (ref 135–145)
Total Bilirubin: 0.5 mg/dL (ref 0.0–1.2)
Total Protein: 6.9 g/dL (ref 6.5–8.1)

## 2024-01-10 LAB — CBC WITH DIFFERENTIAL (CANCER CENTER ONLY)
Abs Immature Granulocytes: 0.03 K/uL (ref 0.00–0.07)
Basophils Absolute: 0 K/uL (ref 0.0–0.1)
Basophils Relative: 0 %
Eosinophils Absolute: 0 K/uL (ref 0.0–0.5)
Eosinophils Relative: 1 %
HCT: 27.8 % — ABNORMAL LOW (ref 39.0–52.0)
Hemoglobin: 8.9 g/dL — ABNORMAL LOW (ref 13.0–17.0)
Immature Granulocytes: 1 %
Lymphocytes Relative: 35 %
Lymphs Abs: 1 K/uL (ref 0.7–4.0)
MCH: 26 pg (ref 26.0–34.0)
MCHC: 32 g/dL (ref 30.0–36.0)
MCV: 81.3 fL (ref 80.0–100.0)
Monocytes Absolute: 0.8 K/uL (ref 0.1–1.0)
Monocytes Relative: 30 %
Neutro Abs: 0.9 K/uL — ABNORMAL LOW (ref 1.7–7.7)
Neutrophils Relative %: 33 %
Platelet Count: 28 K/uL — ABNORMAL LOW (ref 150–400)
RBC: 3.42 MIL/uL — ABNORMAL LOW (ref 4.22–5.81)
RDW: 16.7 % — ABNORMAL HIGH (ref 11.5–15.5)
WBC Count: 2.8 K/uL — ABNORMAL LOW (ref 4.0–10.5)
nRBC: 0 % (ref 0.0–0.2)

## 2024-01-10 MED ORDER — EPOETIN ALFA 20000 UNIT/ML IJ SOLN
20000.0000 [IU] | Freq: Once | INTRAMUSCULAR | Status: AC
  Administered 2024-01-10: 20000 [IU] via SUBCUTANEOUS
  Filled 2024-01-10: qty 1

## 2024-01-15 ENCOUNTER — Ambulatory Visit: Admitting: Internal Medicine

## 2024-01-15 ENCOUNTER — Other Ambulatory Visit: Payer: Self-pay | Admitting: Infectious Diseases

## 2024-01-17 ENCOUNTER — Inpatient Hospital Stay: Payer: PPO

## 2024-01-17 ENCOUNTER — Inpatient Hospital Stay: Payer: PPO | Admitting: Hematology and Oncology

## 2024-01-24 ENCOUNTER — Other Ambulatory Visit: Payer: Self-pay

## 2024-01-24 ENCOUNTER — Inpatient Hospital Stay

## 2024-01-24 ENCOUNTER — Other Ambulatory Visit: Payer: Self-pay | Admitting: Hematology and Oncology

## 2024-01-24 ENCOUNTER — Inpatient Hospital Stay (HOSPITAL_BASED_OUTPATIENT_CLINIC_OR_DEPARTMENT_OTHER): Admitting: Hematology and Oncology

## 2024-01-24 VITALS — BP 147/61 | HR 79 | Temp 97.9°F | Resp 14 | Wt 158.7 lb

## 2024-01-24 DIAGNOSIS — D61818 Other pancytopenia: Secondary | ICD-10-CM

## 2024-01-24 DIAGNOSIS — D469 Myelodysplastic syndrome, unspecified: Secondary | ICD-10-CM

## 2024-01-24 DIAGNOSIS — D4620 Refractory anemia with excess of blasts, unspecified: Secondary | ICD-10-CM | POA: Diagnosis not present

## 2024-01-24 LAB — CMP (CANCER CENTER ONLY)
ALT: <5 U/L (ref 0–44)
AST: 16 U/L (ref 15–41)
Albumin: 4.1 g/dL (ref 3.5–5.0)
Alkaline Phosphatase: 60 U/L (ref 38–126)
Anion gap: 5 (ref 5–15)
BUN: 23 mg/dL (ref 8–23)
CO2: 26 mmol/L (ref 22–32)
Calcium: 8.9 mg/dL (ref 8.9–10.3)
Chloride: 108 mmol/L (ref 98–111)
Creatinine: 1.18 mg/dL (ref 0.61–1.24)
GFR, Estimated: >60 mL/min (ref >60)
Glucose, Bld: 107 mg/dL — ABNORMAL HIGH (ref 70–99)
Potassium: 4.3 mmol/L (ref 3.5–5.1)
Sodium: 139 mmol/L (ref 135–145)
Total Bilirubin: 0.7 mg/dL (ref 0.0–1.2)
Total Protein: 7 g/dL (ref 6.5–8.1)

## 2024-01-24 LAB — RETIC PANEL
Immature Retic Fract: 6.9 % (ref 2.3–15.9)
RBC.: 3.85 MIL/uL — ABNORMAL LOW (ref 4.22–5.81)
Retic Count, Absolute: 88 K/uL (ref 19.0–186.0)
Retic Ct Pct: 2.3 % (ref 0.4–3.1)
Reticulocyte Hemoglobin: 24.7 pg — ABNORMAL LOW (ref >27.9)

## 2024-01-24 LAB — CBC WITH DIFFERENTIAL (CANCER CENTER ONLY)
Abs Immature Granulocytes: 0.01 K/uL (ref 0.00–0.07)
Basophils Absolute: 0 K/uL (ref 0.0–0.1)
Basophils Relative: 0 %
Eosinophils Absolute: 0 K/uL (ref 0.0–0.5)
Eosinophils Relative: 1 %
HCT: 30.2 % — ABNORMAL LOW (ref 39.0–52.0)
Hemoglobin: 9.5 g/dL — ABNORMAL LOW (ref 13.0–17.0)
Immature Granulocytes: 0 %
Lymphocytes Relative: 37 %
Lymphs Abs: 1 K/uL (ref 0.7–4.0)
MCH: 25.9 pg — ABNORMAL LOW (ref 26.0–34.0)
MCHC: 31.5 g/dL (ref 30.0–36.0)
MCV: 82.3 fL (ref 80.0–100.0)
Monocytes Absolute: 0.8 K/uL (ref 0.1–1.0)
Monocytes Relative: 30 %
Neutro Abs: 0.8 K/uL — ABNORMAL LOW (ref 1.7–7.7)
Neutrophils Relative %: 32 %
Platelet Count: 33 K/uL — ABNORMAL LOW (ref 150–400)
RBC: 3.67 MIL/uL — ABNORMAL LOW (ref 4.22–5.81)
RDW: 16.1 % — ABNORMAL HIGH (ref 11.5–15.5)
WBC Count: 2.6 K/uL — ABNORMAL LOW (ref 4.0–10.5)
nRBC: 0 % (ref 0.0–0.2)

## 2024-01-24 LAB — FERRITIN: Ferritin: 106 ng/mL (ref 24–336)

## 2024-01-24 LAB — IRON AND IRON BINDING CAPACITY (CC-WL,HP ONLY)
Iron: 85 ug/dL (ref 45–182)
Saturation Ratios: 29 % (ref 17.9–39.5)
TIBC: 290 ug/dL (ref 250–450)
UIBC: 205 ug/dL (ref 117–376)

## 2024-01-24 MED ORDER — CEFADROXIL 500 MG PO CAPS: 1000.0000 mg | ORAL_CAPSULE | Freq: Two times a day (BID) | ORAL | 3 refills | Status: DC

## 2024-01-24 MED ORDER — EPOETIN ALFA 20000 UNIT/ML IJ SOLN
20000.0000 [IU] | Freq: Once | INTRAMUSCULAR | Status: AC
  Administered 2024-01-24: 20000 [IU] via SUBCUTANEOUS
  Filled 2024-01-24: qty 1

## 2024-01-24 NOTE — Progress Notes (Signed)
 [facility] [phone]   [phone]  PROGRESS NOTE  Patient Care Team: Plotnikov, Karlynn GAILS, MD as PCP - General Loni Soyla LABOR, MD as PCP - Cardiology (Cardiology) Rollin Dover, MD as Consulting Physician (Gastroenterology) [place], Amy, MD as Consulting Physician (Dermatology) Tobie Tonita POUR, DO as Consulting Physician (Neurology) Bonner Ade, MD as Consulting Physician (Physical Medicine and Rehabilitation) Ernie Cough, MD as Consulting Physician (Orthopedic Surgery) Szabat, Toribio BROCKS, [facility] (Inactive) as Pharmacist (Pharmacist) Federico Norleen ONEIDA MADISON, MD as Consulting Physician (Hematology and Oncology) Celena Sharper, MD as Consulting Physician (Orthopedic Surgery)  Hematological/Oncological History # Pancytopenia #Myelodysplastic Syndrome, Single Lineage Dysplasia 11/18/2021: WBC 2.4, ANC 1.1, Hgb 9.7, MCV 84.0, Plt 58. 12/07/2021: Establish care with Dr. Federico and [employer] 12/19/2021: Bone marrow biopsy which showed a low-grade myelodysplastic syndrome.  6/28/203: Started retacrit  20,000 units q 2 weeks  Interval History:  Justin [name]. 83 y.o. male with medical history significant for newly diagnosed low-grade myelodysplastic syndrome who presents for a follow up visit. The patient's last visit was on 11/02/2023. In the interim, he continues on retacrit  injections q 2 weeks.  On exam today Justin [name] is accompanied by his daughter.  He reports that he has been well since his discharge from the hospital.  He reports his weight is up to 158 pounds from 151 pounds.  He reports he is drinking protein shakes.  He reports his energy remains terrible.  His energy is about a 3 or 4 out of 10.  He reports he feels occasionally unbalanced and exhausted.  He is had no falls.  He denies any lightheadedness, dizziness, shortness of breath.  He is not having any headaches.  He notes he has had no nosebleeds, gum bleeding, or dark stools.  He  does have some occasional bruising on his hands.  He reports his appetite is fair and he has been eating well.  He is trying to build up his stamina.  He otherwise denies any fevers, chills, sweats, nausea, vomiting or diarrhea.  Full 10 point ROS is otherwise negative.  MEDICAL HISTORY:  Past Medical History:  Diagnosis Date   Alcoholism (HCC)    Dry 13 years   Basal cell carcinoma    Right Chest   BPH (benign prostatic hyperplasia)    Diverticulosis of colon    Dizzy spells    none recent   DJD (degenerative joint disease)    lower back   Elbow fracture, left 2009   Dr Celena   FRACTURE, [place], LEFT 12/23/2007   Qualifier: Diagnosis of   By: Georgian ROSALEA CHARM Lamar        GERD (gastroesophageal reflux disease)    Glaucoma    History of TIAs 1 yrs ago   Hyperkeratosis    LBP (low back pain)    Macular degeneration    left wet right dry sees dr elner   Melanoma [facility]) 2009   x3 Dr Amy [place]   Osteoarthritis    Restless leg syndrome    Thrombocytopenia (HCC) 11/18/2021   Tinnitus    Tobacco abuse    Uses walker    Wears glasses    reading   Wears partial dentures    upper    SURGICAL HISTORY: Past Surgical History:  Procedure Laterality Date   CATARACT EXTRACTION Bilateral 2018   HARDWARE REMOVAL Left 07/09/2020   Procedure: Left elbow hardware removal;  Surgeon: Sharl Selinda Dover, MD;  Location: [facility];  Service:  Orthopedics;  Laterality: Left;  60 mins   HARDWARE REMOVAL Left 10/23/2023   Procedure: REMOVAL, HARDWARE;  Surgeon: Celena Sharper, MD;  Location: [facility] OR;  Service: Orthopedics;  Laterality: Left;   IRRIGATION AND DEBRIDEMENT ELBOW Left 10/23/2023   Procedure: IRRIGATION AND DEBRIDEMENT ELBOW;  Surgeon: Celena Sharper, MD;  Location: [facility] OR;  Service: Orthopedics;  Laterality: Left;   mda     Dr. Elner wet injection   MELANOMA EXCISION  2009   x3   RECONSTRUCTION MEDIAL COLLATERAL LIGAMENT ELBOW W/ TENDON GRAFT  2009   Left, Dr Celena    TONSILLECTOMY  as child   TOTAL KNEE ARTHROPLASTY Right 11/09/2020   Procedure: TOTAL KNEE ARTHROPLASTY;  Surgeon: Ernie Cough, MD;  Location: [facility] ORS;  Service: Orthopedics;  Laterality: Right;   TRANSESOPHAGEAL ECHOCARDIOGRAM (CATH LAB) N/A 10/24/2023   Procedure: TRANSESOPHAGEAL ECHOCARDIOGRAM;  Surgeon: Jeffrie Oneil BROCKS, MD;  Location: [facility] INVASIVE CV LAB;  Service: Cardiovascular;  Laterality: N/A;    SOCIAL HISTORY: Social History   Socioeconomic History   Marital status: Married    Spouse name: Rollene Caldron   Number of children: 2   Years of education: Not on file   Highest education level: Not on file  Occupational History   Occupation: [place] Veteran - [employer]    Employer: RETIRED   Occupation: Scientist, research (physical sciences)   Occupation: Games developer  Tobacco Use   Smoking status: Former    Current packs/day: 0.00    Average packs/day: 3.0 packs/day for 38.0 years (114.0 ttl pk-yrs)    Types: Cigarettes    Start date: 73    Quit date: 1986    Years since quitting: 39.5   Smokeless tobacco: Never   Tobacco comments:    states he quit May 15 1985  Vaping Use   Vaping status: Never Used  Substance and Sexual Activity   Alcohol  use: No    Comment: PREVIOUS - DRY 52yrs   Drug use: Not Currently   Sexual activity: Not Currently  Other Topics Concern   Not on file  Social History Narrative   Right handed   One story home   Drinks coffee q am      Son and wife lives with him-2025   Social Drivers of Health   Financial Resource Strain: Low Risk  (07/20/2023)   Overall Financial Resource Strain (CARDIA)    Difficulty of Paying Living Expenses: Not very hard  Food Insecurity: No Food Insecurity (10/21/2023)   Hunger Vital Sign    Worried About Running Out of Food in the Last Year: Never true    Ran Out of Food in the Last Year: Never true  Transportation Needs: No Transportation Needs (10/21/2023)   PRAPARE - Administrator, Civil Service (Medical): No     Lack of Transportation (Non-Medical): No  Physical Activity: Inactive (07/20/2023)   Exercise Vital Sign    Days of Exercise per Week: 0 days    Minutes of Exercise per Session: 0 min  Stress: No Stress Concern Present (07/20/2023)   [employer] of Occupational Health - Occupational Stress Questionnaire    Feeling of Stress : Not at all  Social Connections: Moderately Isolated (10/21/2023)   Social Connection and Isolation Panel    Frequency of Communication with Friends and Family: More than three times a week    Frequency of Social Gatherings with Friends and Family: Three times a week    Attends Religious Services: Never    Active Member  of Clubs or Organizations: No    Attends Banker Meetings: Never    Marital Status: Married  Catering manager Violence: Not At Risk (10/21/2023)   Humiliation, Afraid, Rape, and Kick questionnaire    Fear of Current or Ex-Partner: No    Emotionally Abused: No    Physically Abused: No    Sexually Abused: No    FAMILY HISTORY: Family History  Problem Relation Age of Onset   Cancer Mother        ?breast   Cancer Father        Lung    ALLERGIES:  is allergic to levofloxacin.  MEDICATIONS:  Current Outpatient Medications  Medication Sig Dispense Refill   acetaminophen  (TYLENOL ) 500 MG tablet Take 1,000 mg by mouth every 6 (six) hours as needed for mild pain (pain score 1-3) or moderate pain (pain score 4-6).     amLODipine  (NORVASC ) 5 MG tablet TAKE 1 TABLET BY MOUTH DAILY 90 tablet 3   calcium  carbonate (TUMS EX) 750 MG chewable tablet Chew 1,500 mg by mouth daily as needed for heartburn.     carbidopa -levodopa  (SINEMET  IR) 25-100 MG tablet For breakthrough restless leg, you can take 1 tablet at bedtime.  OK to take extra tab as needed during the day. 100 tablet 3   cefadroxil  (DURICEF) 500 MG capsule Take 2 capsules (1,000 mg total) by mouth 2 (two) times daily. 120 capsule 3   Cholecalciferol  1000 UNITS tablet Take 3,000  Units by mouth daily.     clotrimazole -betamethasone  (LOTRISONE ) cream Apply 1 Application topically daily. 60 g 0   Cyanocobalamin  (VITAMIN B-12) 1000 MCG SUBL DISSOLVE 2 TABLETS IN MOUTH EVERY DAY 180 tablet 1   finasteride  (PROSCAR ) 5 MG tablet TAKE 1 TABLET (5 MG TOTAL) BY MOUTH DAILY. 90 tablet 1   gabapentin  (NEURONTIN ) 300 MG capsule Take 1 capsule (300 mg total) by mouth 3 (three) times daily.     ibuprofen (ADVIL) 200 MG tablet Take 400 mg by mouth every 6 (six) hours as needed for mild pain (pain score 1-3) or moderate pain (pain score 4-6).     magnesium  hydroxide (MILK OF MAGNESIA) 400 MG/5ML suspension Take 15 mLs by mouth daily as needed for mild constipation.     meclizine  (ANTIVERT ) 12.5 MG tablet TAKE 1 TABLET BY MOUTH 3 TIMES DAILY AS NEEDED FOR DIZZINESS. 90 tablet 2   Multiple Vitamins-Minerals (PRESERVISION AREDS PO) Take 2 tablets by mouth 2 (two) times daily.     ondansetron  (ZOFRAN ) 4 MG tablet Take 1 tablet (4 mg total) by mouth every 6 (six) hours as needed for nausea or vomiting. 30 tablet 0   Oxycodone  HCl 10 MG TABS Take 1 tablet (10 mg total) by mouth 5 (five) times daily for 5 days. 25 tablet 0   Polyethyl Glycol-Propyl Glycol (SYSTANE OP) Place 1 drop into both eyes 2 (two) times daily as needed (dry eyes). [facility] O[facility]     pravastatin  (PRAVACHOL ) 20 MG tablet TAKE 1 TABLET (20 MG TOTAL) BY MOUTH DAILY. 90 tablet 1   rOPINIRole  (REQUIP ) 2 MG tablet TAKE 1 TABLET BY MOUTH AT AT NOON AND 1 TAB AT 4PM AND 1 TAB AT BEDTIME (Patient taking differently: Take 1 mg by mouth in the morning, at noon, in the evening, and at bedtime.) 270 tablet 1   tamsulosin  (FLOMAX ) 0.4 MG CAPS capsule TAKE 1 CAPSULE (0.4 MG TOTAL) BY MOUTH IN THE MORNING. 90 capsule 1   terazosin  (HYTRIN ) 2 MG capsule TAKE  1 CAPSULE BY MOUTH EVERY NIGHT AT BEDTIME 90 capsule 1   torsemide  (DEMADEX ) 100 MG tablet Take 0.5-1 tablets (50-100 mg total) by mouth daily. (Patient not taking: Reported on 01/03/2024) 90  tablet 1   No current facility-administered medications for this visit.   Facility-Administered Medications Ordered in Other Visits  Medication Dose Route Frequency Provider Last Rate Last Admin   epoetin  alfa (EPOGEN ) injection 20,000 Units  20,000 Units Subcutaneous Once Federico Norleen ONEIDA MADISON, MD        REVIEW OF SYSTEMS:   Constitutional: ( - ) fevers, ( - )  chills , ( - ) night sweats Eyes: ( - ) blurriness of vision, ( - ) double vision, ( - ) watery eyes Ears, nose, mouth, throat, and face: ( - ) mucositis, ( - ) sore throat Respiratory: ( - ) cough, ( - ) dyspnea, ( - ) wheezes Cardiovascular: ( - ) palpitation, ( - ) chest discomfort, ( - ) lower extremity swelling Gastrointestinal:  ( - ) nausea, ( - ) heartburn, ( - ) change in bowel habits Skin: ( - ) abnormal skin rashes Lymphatics: ( - ) new lymphadenopathy, ( - ) easy bruising Neurological: ( - ) numbness, ( - ) tingling, ( - ) new weaknesses Behavioral/Psych: ( - ) mood change, ( - ) new changes  All other systems were reviewed with the patient and are negative.  PHYSICAL EXAMINATION:  Vitals:   01/24/24 1004  BP: (!) 147/61  Pulse: 79  Resp: 14  Temp: 97.9 F (36.6 C)  SpO2: 98%         Filed Weights   01/24/24 1004  Weight: 158 lb 11.2 oz (72 kg)         GENERAL: Well-appearing elderly Caucasian male, alert, no distress and comfortable SKIN: skin color, texture, turgor are normal, no rashes or significant lesions EYES: conjunctiva are pink and non-injected, sclera clear LUNGS: clear to auscultation and percussion with normal breathing effort HEART: regular rate & rhythm and no murmurs and no lower extremity edema Musculoskeletal: no cyanosis of digits and no clubbing  PSYCH: alert & oriented x 3, fluent speech NEURO: no focal motor/sensory deficits  LABORATORY DATA:  I have reviewed the data as listed    Latest Ref Rng & Units 01/24/2024    9:39 AM 01/10/2024   11:09 AM 12/27/2023   11:05 AM   CBC  WBC 4.0 - 10.5 K/uL 2.6  2.8  2.2   Hemoglobin 13.0 - 17.0 g/dL 9.5  8.9  9.7   Hematocrit 39.0 - 52.0 % 30.2  27.8  30.4   Platelets 150 - 400 K/uL 33  28  27        Latest Ref Rng & Units 01/10/2024   11:09 AM 12/27/2023   11:05 AM 12/13/2023   10:58 AM  CMP  Glucose 70 - 99 mg/dL 91  874  884   BUN 8 - 23 mg/dL 17  17  15    Creatinine 0.61 - 1.24 mg/dL 8.96  8.91  9.09   Sodium 135 - 145 mmol/L 140  139  140   Potassium 3.5 - 5.1 mmol/L 4.5  4.1  4.1   Chloride 98 - 111 mmol/L 108  108  106   CO2 22 - 32 mmol/L 28  24  23    Calcium  8.9 - 10.3 mg/dL 9.1  9.1  9.0   Total Protein 6.5 - 8.1 g/dL 6.9  7.0  6.9  Total Bilirubin 0.0 - 1.2 mg/dL 0.5  0.7  0.9   Alkaline Phos 38 - 126 U/L 66  57  65   AST 15 - 41 U/L 14  14  19    ALT 0 - 44 U/L 5  <5  6     Lab Results  Component Value Date   MPROTEIN Not Observed 12/07/2021   Lab Results  Component Value Date   KPAFRELGTCHN 45.3 (H) 12/07/2021   LAMBDASER 28.9 (H) 12/07/2021   KAPLAMBRATIO 1.57 12/07/2021   RADIOGRAPHIC STUDIES: No results found.   ASSESSMENT & PLAN Justin [name]. Is a 83 y.o. male who returns for a follow up for MDS.   # Myelodysplastic Syndrome, Single Lineage Dysplasia -- At this time Justin [name] has a low-grade myelodysplasia that is anemia predominant. --Given that his hemoglobin is consistently less than 10 I would recommend we proceed with erythropoietin  therapy. Discussed with the patient other options including hypomethylating therapies and Luspatercept , however given that anemia is his primary issue would recommend erythropoietin  to see if this helps to elevate his other blood counts as well. --No clear signs of nutritional deficiency during our prior evaluation. --MDS FISH study was normal.  --Started retacrit  20,000 units q 2 weeks on 12/28/2021.  PLAN: --Due for retacrit  injection today. Currently on 20,000 units q 2 weeks.  --Labs from today reviewed. Hgb 9.5, MCV 82.3,  platelets 33, white blood cell 2.6 --Will continue to monitor as long as platelet count is >20K. Could consider Promacta vs Nplate injections if Plt levels drop  --Continue with q 2 week retacrit  injections as scheduled --RTC in 12 weeks time to assure he is tolerating the shots with continued q 2 week injections/labs   No orders of the defined types were placed in this encounter.   All questions were answered. The patient knows to call the clinic with any problems, questions or concerns.  A total of more than 30 minutes were spent on this encounter with face-to-face time and non-face-to-face time, including preparing to see the patient, ordering tests and/or medications, counseling the patient and coordination of care as outlined above.   Norleen IVAR Kidney, MD Department of Hematology/Oncology [facility] at [facility] Phone: [phone] Pager: [phone] Email: [email or web address]  01/24/2024 10:15 AM

## 2024-01-25 ENCOUNTER — Ambulatory Visit: Admitting: Podiatry

## 2024-01-25 VITALS — Ht 71.0 in | Wt 158.7 lb

## 2024-01-25 DIAGNOSIS — M79674 Pain in right toe(s): Secondary | ICD-10-CM | POA: Diagnosis not present

## 2024-01-25 DIAGNOSIS — M79675 Pain in left toe(s): Secondary | ICD-10-CM | POA: Diagnosis not present

## 2024-01-25 DIAGNOSIS — B351 Tinea unguium: Secondary | ICD-10-CM | POA: Diagnosis not present

## 2024-01-25 LAB — ERYTHROPOIETIN: Erythropoietin: 15.5 m[IU]/mL (ref 2.6–18.5)

## 2024-01-25 NOTE — Progress Notes (Signed)
 Subjective: Chief Complaint  Patient presents with   Nail Problem    Rm 13 Patient is here routine foot care and nail trimming.      83 y.o. returns the office today for painful, elongated, thickened toenails which No open lesions, swelling, redness or signs of infection he reports.   PCP: Plotnikov, Karlynn GAILS, MD  Objective: AAO 3, NAD DP/PT pulses palpable, CRT less than 3 seconds Neurological status is unchanged.  Nails hypertrophic, dystrophic, elongated, brittle, discolored 10. There is tenderness overlying the nails 1-5 bilaterally. There is no surrounding erythema or drainage along the nail sites. No significant callus formation noted today on the left second toe which she has had previously.  The sulcus plantarly of the right second toe looks again old blister.  There is no drainage or pus currently noted.  There is no fluctuance or crepitation.  No open lesions. Hammertoes are present. No pain with calf compression, swelling, warmth, erythema.  Assessment: Patient presents with symptomatic onychomycosis; hyperkeratotic lesion   Plan: -Treatment options including alternatives, risks, complications were discussed -Nails sharply debrided 10 without complication/bleeding. - Needs to monitor the area of the old blister on the right second toe.  He has no redness is present.  He had quite a bit of between his toes which are clean today as well.  Discussed drying thoroughly between the toes.    Return in about 3 months (around 04/26/2024).  Donnice JONELLE Fees DPM

## 2024-01-29 ENCOUNTER — Ambulatory Visit: Admitting: Infectious Diseases

## 2024-01-29 DIAGNOSIS — H353221 Exudative age-related macular degeneration, left eye, with active choroidal neovascularization: Secondary | ICD-10-CM | POA: Diagnosis not present

## 2024-01-29 DIAGNOSIS — H401130 Primary open-angle glaucoma, bilateral, stage unspecified: Secondary | ICD-10-CM | POA: Diagnosis not present

## 2024-01-29 DIAGNOSIS — H43812 Vitreous degeneration, left eye: Secondary | ICD-10-CM | POA: Diagnosis not present

## 2024-01-29 DIAGNOSIS — H353111 Nonexudative age-related macular degeneration, right eye, early dry stage: Secondary | ICD-10-CM | POA: Diagnosis not present

## 2024-01-31 ENCOUNTER — Inpatient Hospital Stay: Payer: PPO

## 2024-01-31 DIAGNOSIS — C44219 Basal cell carcinoma of skin of left ear and external auricular canal: Secondary | ICD-10-CM | POA: Diagnosis not present

## 2024-01-31 DIAGNOSIS — Z85828 Personal history of other malignant neoplasm of skin: Secondary | ICD-10-CM | POA: Diagnosis not present

## 2024-02-04 ENCOUNTER — Other Ambulatory Visit: Payer: Self-pay

## 2024-02-04 ENCOUNTER — Encounter: Payer: Self-pay | Admitting: Infectious Diseases

## 2024-02-04 ENCOUNTER — Ambulatory Visit: Admitting: Infectious Diseases

## 2024-02-04 VITALS — BP 146/68 | HR 77 | Temp 97.5°F | Wt 158.0 lb

## 2024-02-04 DIAGNOSIS — B9561 Methicillin susceptible Staphylococcus aureus infection as the cause of diseases classified elsewhere: Secondary | ICD-10-CM

## 2024-02-04 DIAGNOSIS — G8929 Other chronic pain: Secondary | ICD-10-CM

## 2024-02-04 DIAGNOSIS — Z79899 Other long term (current) drug therapy: Secondary | ICD-10-CM

## 2024-02-04 DIAGNOSIS — M545 Low back pain, unspecified: Secondary | ICD-10-CM

## 2024-02-04 DIAGNOSIS — R7881 Bacteremia: Secondary | ICD-10-CM

## 2024-02-04 DIAGNOSIS — G061 Intraspinal abscess and granuloma: Secondary | ICD-10-CM | POA: Diagnosis not present

## 2024-02-04 DIAGNOSIS — M71122 Other infective bursitis, left elbow: Secondary | ICD-10-CM | POA: Diagnosis not present

## 2024-02-04 DIAGNOSIS — A4901 Methicillin susceptible Staphylococcus aureus infection, unspecified site: Secondary | ICD-10-CM

## 2024-02-04 NOTE — Progress Notes (Signed)
 Patient Active Problem List   Diagnosis Date Noted   Intertrigo 12/16/2023   MSSA (methicillin susceptible Staphylococcus aureus) infection 12/11/2023   Swelling of lower leg 11/17/2023   PICC (peripherally inserted central catheter) in place 11/17/2023   Medication management 11/17/2023   Erysipelas of both lower extremities 11/12/2023   Urinary incontinence 11/12/2023   Septic bursitis of elbow 11/06/2023   Abscess of epidural space of spine due to bacteria 10/25/2023   Epidural abscess 10/25/2023   MSSA bacteremia 10/22/2023   Hypokalemia 10/21/2023   Acute on chronic pain of left elbow concerning for septic arthritis 10/21/2023   Spinal stenosis of lumbar region 10/16/2023   Pseudophakia of both eyes 02/15/2022   MDS (myelodysplastic syndrome) (HCC) 12/25/2021   Abnormal EKG 11/18/2021   Thrombocytopenia (HCC) 11/18/2021   Lumbar spondylosis 07/22/2021   Lumbar radiculopathy 03/24/2021   S/P total knee arthroplasty, right 11/09/2020   Weight loss 08/30/2020   Complication associated with orthopedic device (HCC) 06/10/2020   Diastolic dysfunction 10/27/2019   Exudative age-related macular degeneration of left eye with active choroidal neovascularization (HCC) 10/15/2019   Intermediate stage nonexudative age-related macular degeneration of left eye 10/15/2019   Early stage nonexudative age-related macular degeneration of right eye 10/15/2019   Pseudophakia 10/15/2019   Posterior vitreous detachment of left eye 10/15/2019   GERD (gastroesophageal reflux disease) 11/13/2018   Coronary artery disease 10/24/2018   Claudication (HCC) 02/22/2018   Anemia 08/28/2017   DOE (dyspnea on exertion) 07/31/2017   Fatigue 07/31/2017   Essential hypertension 11/29/2016   Smoking greater than 30 pack years 02/02/2016   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) 02/28/2013   Ingrowing toenail of left foot 08/29/2012   Allergic rhinitis 10/10/2011   Dupuytren's contracture of hand 07/05/2011    Vitamin B12 deficiency 12/23/2010   BPH (benign prostatic hyperplasia) 12/23/2010   Low back pain 07/27/2009   TOBACCO USE, QUIT 07/27/2009   Diverticulosis of colon 03/28/2009   RLS (restless legs syndrome) 10/28/2008   SKIN RASH, ALLERGIC 10/01/2008   Edema 10/01/2008   Neuropathy 04/22/2008   Vitamin D  deficiency 01/21/2008   INSOMNIA, CHRONIC 12/23/2007   Osteoarthritis 12/23/2007   CAROTID BRUIT, LEFT 12/23/2007   Current Outpatient Medications on File Prior to Visit  Medication Sig Dispense Refill   acetaminophen  (TYLENOL ) 500 MG tablet Take 1,000 mg by mouth every 6 (six) hours as needed for mild pain (pain score 1-3) or moderate pain (pain score 4-6).     amLODipine  (NORVASC ) 5 MG tablet TAKE 1 TABLET BY MOUTH DAILY 90 tablet 3   calcium  carbonate (TUMS EX) 750 MG chewable tablet Chew 1,500 mg by mouth daily as needed for heartburn.     carbidopa -levodopa  (SINEMET  IR) 25-100 MG tablet For breakthrough restless leg, you can take 1 tablet at bedtime.  OK to take extra tab as needed during the day. 100 tablet 3   cefadroxil  (DURICEF) 500 MG capsule Take 2 capsules (1,000 mg total) by mouth 2 (two) times daily. 120 capsule 3   Cholecalciferol  1000 UNITS tablet Take 3,000 Units by mouth daily.     clotrimazole -betamethasone  (LOTRISONE ) cream Apply 1 Application topically daily. 60 g 0   Cyanocobalamin  (VITAMIN B-12) 1000 MCG SUBL DISSOLVE 2 TABLETS IN MOUTH EVERY DAY 180 tablet 1   finasteride  (PROSCAR ) 5 MG tablet TAKE 1 TABLET (5 MG TOTAL) BY MOUTH DAILY. 90 tablet 1   gabapentin  (NEURONTIN ) 300 MG capsule Take 1 capsule (300 mg total) by mouth 3 (three) times daily.  ibuprofen (ADVIL) 200 MG tablet Take 400 mg by mouth every 6 (six) hours as needed for mild pain (pain score 1-3) or moderate pain (pain score 4-6).     magnesium  hydroxide (MILK OF MAGNESIA) 400 MG/5ML suspension Take 15 mLs by mouth daily as needed for mild constipation.     meclizine  (ANTIVERT ) 12.5 MG tablet TAKE  1 TABLET BY MOUTH 3 TIMES DAILY AS NEEDED FOR DIZZINESS. 90 tablet 2   Multiple Vitamins-Minerals (PRESERVISION AREDS PO) Take 2 tablets by mouth 2 (two) times daily.     ondansetron  (ZOFRAN ) 4 MG tablet Take 1 tablet (4 mg total) by mouth every 6 (six) hours as needed for nausea or vomiting. 30 tablet 0   Oxycodone  HCl 10 MG TABS Take 1 tablet (10 mg total) by mouth 5 (five) times daily for 5 days. 25 tablet 0   Polyethyl Glycol-Propyl Glycol (SYSTANE OP) Place 1 drop into both eyes 2 (two) times daily as needed (dry eyes). [facility]     pravastatin  (PRAVACHOL ) 20 MG tablet TAKE 1 TABLET (20 MG TOTAL) BY MOUTH DAILY. 90 tablet 1   rOPINIRole  (REQUIP ) 2 MG tablet TAKE 1 TABLET BY MOUTH AT AT NOON AND 1 TAB AT 4PM AND 1 TAB AT BEDTIME (Patient taking differently: Take 1 mg by mouth in the morning, at noon, in the evening, and at bedtime.) 270 tablet 1   tamsulosin  (FLOMAX ) 0.4 MG CAPS capsule TAKE 1 CAPSULE (0.4 MG TOTAL) BY MOUTH IN THE MORNING. 90 capsule 1   terazosin  (HYTRIN ) 2 MG capsule TAKE 1 CAPSULE BY MOUTH EVERY NIGHT AT BEDTIME 90 capsule 1   torsemide  (DEMADEX ) 100 MG tablet Take 0.5-1 tablets (50-100 mg total) by mouth daily. 90 tablet 1   No current facility-administered medications on file prior to visit.   Subjective: Discussed the use of AI scribe software for clinical note transcription with the patient, who gave verbal consent to proceed.   83 year old male with prior history of HTN, CAD, MDS with thrombocytopenia, depression BCC, BPH, melanoma, GERD, DDD, OA, TIA, h/o Tobacco/alcohol  abuse who is here for HFU 4/20-4/25 for left elbow septic bursitis and lumbar epidural abscess.  Patient had an open fracture of his left elbow in 2007. Approximately 4 years ago one of the pins came loose and it was taken out and he had limited range of motion in his left elbow with inability to fully flex and extend and unable to function with his left hand. Left Elbow x-ray as below. 4/20 left elbow  SF Gram stain with GPC. Cx with MSSA.  4/20 blood culture with MSSA.  4/22 underwent I&D with removal of hardware, 1 screw remaining ( no OP note available). OR cx with MSSA and MSSE. TTE and TEE negative for vegetations or endocarditis.  MRI L spine septic arthritis of left L5-S1.  Left dorsal epidural abscess extending from L4-S1, phlegmonous change in the left psoas.  No surgical intervention per neurosurgery.  Also required PRBC transfusion for anemia. Discharged on 4/25 to complete 8 weeks course of IV cefazolin  through 6/17  5/15 Patient is accompanied by his daughter surgery.  Getting IV antibiotics through PICC without any concerns related to antibiotics or PICC.  Cefazolin  was recently reduced to 1 g every 12 dosing due to elevated kidney function.  Daughter reports increased swelling and redness in his left leg compared to right leg for the last few days history of chronic intermittent swelling of bilateral legs.  Seen by PCP on  Monday and was started on a 7-day course of doxycycline .  Patient is also on diuretic. Saw Dr. Celena yesterday and discharged from his care as left elbow has healed. Also saw Dr Mavis post discharge and, wants to repeat MRI end of month to see if improvement and needs for +/- surgery.  6/10 Accompanied by daughter. Getting IV cefazolin  through PICC without any concerns. Left elbow wound has healed. He has experienced significant improvement in mobility since the last visit three weeks ago. He is now able to walk to the bathroom and bed with the aid of a walker and can walk to the car instead of using a wheelchair. However, he continues to use a wheelchair for longer distances due to balance issues.  He experiences significant back pain, particularly when lying down, rating it close to ten on a scale of one to ten. The pain is alleviated when sitting. He is under pain management with oxycodone , which he takes as needed, usually less than the prescribed five times a day. He  is scheduled for an MRI 6/11.  Next fu with Dr Mavis on July 1. Has one more fu with Dr Celena. Seen by PCP on 6/3, leg swelling has improved since last visit and has stopped lasix  recently due to need to frequently urinate at night.   8/4 He was seen by Dr Overton last couple of times. 6/23 visit, cefadroxil  changed to PO linezolid  for 7 days due to concerns for infection in left elbow then switched back to PO cefadroxil  which he has been taking till date without concerns.   Follows pain specialist Dr Bonner for back pain, back pain is 2-3/10 at baseline and 7/10 when he walks around, lies at back. Was prescribed prednisone  taper last Friday from pain clinic and has 2 more days left, can't tell if made a difference. Discharged by Neurosurgery.   Left elbow with no concerns, and Dr Celena had no concerns as well per daughter.   Review of Systems: All systems reviewed and negative except as above.  Denies fever, chills, sweats.  Denies nausea, vomiting or diarrhea.  Past Medical History:  Diagnosis Date   Alcoholism (HCC)    Dry 13 years   Basal cell carcinoma    Right Chest   BPH (benign prostatic hyperplasia)    Diverticulosis of colon    Dizzy spells    none recent   DJD (degenerative joint disease)    lower back   Elbow fracture, left 2009   Dr Celena   FRACTURE, [place], LEFT 12/23/2007   Qualifier: Diagnosis of   By: Georgian ROSALEA CHARM Lamar        GERD (gastroesophageal reflux disease)    Glaucoma    History of TIAs 1 yrs ago   Hyperkeratosis    LBP (low back pain)    Macular degeneration    left wet right dry sees dr elner   [facility]   x3 Dr Amy [place]   Osteoarthritis    Restless leg syndrome    Thrombocytopenia (HCC) 11/18/2021   Tinnitus    Tobacco abuse    Uses walker    Wears glasses    reading   Wears partial dentures    upper    Past Surgical History:  Procedure Laterality Date   CATARACT EXTRACTION Bilateral 2018   HARDWARE REMOVAL Left 07/09/2020    Procedure: Left elbow hardware removal;  Surgeon: Sharl Selinda Dover, MD;  Location: [facility];  Service: Orthopedics;  Laterality: Left;  60 mins   HARDWARE REMOVAL Left 10/23/2023   Procedure: REMOVAL, HARDWARE;  Surgeon: Celena Sharper, MD;  Location: [facility] OR;  Service: Orthopedics;  Laterality: Left;   IRRIGATION AND DEBRIDEMENT ELBOW Left 10/23/2023   Procedure: IRRIGATION AND DEBRIDEMENT ELBOW;  Surgeon: Celena Sharper, MD;  Location: [facility] OR;  Service: Orthopedics;  Laterality: Left;   mda     Dr. Elner wet injection   MELANOMA EXCISION  2009   x3   RECONSTRUCTION MEDIAL COLLATERAL LIGAMENT ELBOW W/ TENDON GRAFT  2009   Left, Dr Celena   TONSILLECTOMY  as child   TOTAL KNEE ARTHROPLASTY Right 11/09/2020   Procedure: TOTAL KNEE ARTHROPLASTY;  Surgeon: Ernie Cough, MD;  Location: [facility] ORS;  Service: Orthopedics;  Laterality: Right;   TRANSESOPHAGEAL ECHOCARDIOGRAM (CATH LAB) N/A 10/24/2023   Procedure: TRANSESOPHAGEAL ECHOCARDIOGRAM;  Surgeon: Jeffrie Oneil BROCKS, MD;  Location: [facility] INVASIVE CV LAB;  Service: Cardiovascular;  Laterality: N/A;    Social History   Tobacco Use   Smoking status: Former    Current packs/day: 0.00    Average packs/day: 3.0 packs/day for 38.0 years (114.0 ttl pk-yrs)    Types: Cigarettes    Start date: 6    Quit date: 5    Years since quitting: 39.6   Smokeless tobacco: Never   Tobacco comments:    states he quit May 15 1985  Vaping Use   Vaping status: Never Used  Substance Use Topics   Alcohol  use: No    Comment: PREVIOUS - DRY 13yrs   Drug use: Not Currently    Family History  Problem Relation Age of Onset   Cancer Mother        ?breast   Cancer Father        Lung    Allergies  Allergen Reactions   Levofloxacin Other (See Comments)    REACTION: hands pealed    Health Maintenance  Topic Date Due   COVID-19 Vaccine (4 - 2024-25 season) 03/04/2023   INFLUENZA VACCINE  02/01/2024   Medicare Annual Wellness (AWV)   07/19/2024   DTaP/Tdap/Td (3 - Td or Tdap) 05/24/2032   Pneumococcal Vaccine: 50+ Years  Completed   Hepatitis B Vaccines  Aged Out   HPV VACCINES  Aged Out   Meningococcal B Vaccine  Aged Out   Zoster Vaccines- Shingrix  Discontinued   Objective: BP (!) 146/68   Pulse 77   Temp (!) 97.5 F (36.4 C) (Oral)   Wt 158 lb (71.7 kg)   SpO2 98%   BMI 22.04 kg/m    Physical Exam Constitutional:      Appearance: Normal appearance.  HENT:     Head: Normocephalic and atraumatic.      Mouth: Mucous membranes are moist.  Eyes:    Conjunctiva/sclera: Conjunctivae normal.     Pupils: Pupils are equal, round, and b/l symmetrical    Cardiovascular:     Rate and Rhythm: Normal rate and regular rhythm.     Heart sounds:  Pulmonary:     Effort: Pulmonary effort is normal.     Breath sounds:  Abdominal:     General: Non distended     Palpations:   Musculoskeletal:        General: walking with support. Left elbow wound has healed with no signs of infection. NO wound in the back.   Skin:    General: Skin is warm and dry.     Comments:   Neurological:     General:  grossly non focal     Mental Status: awake, alert and oriented to person, place, and time.   Psychiatric:        Mood and Affect: Mood normal.   Lab Results Lab Results  Component Value Date   WBC 2.6 (L) 01/24/2024   HGB 9.5 (L) 01/24/2024   HCT 30.2 (L) 01/24/2024   MCV 82.3 01/24/2024   PLT 33 (L) 01/24/2024    Lab Results  Component Value Date   CREATININE 1.18 01/24/2024   BUN 23 01/24/2024   NA 139 01/24/2024   K 4.3 01/24/2024   CL 108 01/24/2024   CO2 26 01/24/2024    Lab Results  Component Value Date   ALT <5 01/24/2024   AST 16 01/24/2024   ALKPHOS 60 01/24/2024   BILITOT 0.7 01/24/2024    Lab Results  Component Value Date   CHOL 136 01/12/2017   HDL 54.70 01/12/2017   LDLCALC 68 01/12/2017   TRIG 67.0 01/12/2017   CHOLHDL 2 01/12/2017   No results found for: LABRPR,  RPRTITER No results found for: HIV1RNAQUANT, HIV1RNAVL, CD4TABS   Microbiology Results for orders placed or performed during the hospital encounter of 10/21/23  Blood Cultures x 2 sites     Status: Abnormal   Collection Time: 10/21/23 11:27 AM   Specimen: Site Not Specified; Blood  Result Value Ref Range Status   Specimen Description   Final    SITE NOT SPECIFIED Performed at [facility], [address], [place], [place] [address]    Special Requests   Final    BOTTLES DRAWN AEROBIC AND ANAEROBIC Blood Culture adequate volume Performed at [facility], [address] [place], [place] [address]    Culture  Setup Time   Final    GRAM POSITIVE COCCI IN CLUSTERS IN BOTH AEROBIC AND ANAEROBIC BOTTLES CRITICAL RESULT CALLED TO, READ BACK BY AND VERIFIED WITH: PHARMD G ABBOTT 10/22/2023 @ 0610 BY AB Performed at [facility], [address] [place], [place] [address]    Culture STAPHYLOCOCCUS AUREUS (A)  Final   Report Status 10/24/2023 FINAL  Final   Organism ID, Bacteria STAPHYLOCOCCUS AUREUS  Final      Susceptibility   Staphylococcus aureus - MIC*    CIPROFLOXACIN >=8 RESISTANT Resistant     ERYTHROMYCIN >=8 RESISTANT Resistant     GENTAMICIN <=0.5 SENSITIVE Sensitive     OXACILLIN 0.5 SENSITIVE Sensitive     TETRACYCLINE <=1 SENSITIVE Sensitive     VANCOMYCIN  1 SENSITIVE Sensitive     TRIMETH /SULFA  >=320 RESISTANT Resistant     CLINDAMYCIN  <=0.25 SENSITIVE Sensitive     RIFAMPIN <=0.5 SENSITIVE Sensitive     Inducible Clindamycin  NEGATIVE Sensitive     LINEZOLID  2 SENSITIVE Sensitive     * STAPHYLOCOCCUS AUREUS  Blood Culture ID Panel (Reflexed)     Status: Abnormal   Collection Time: 10/21/23 11:27 AM  Result Value Ref Range Status   Enterococcus faecalis NOT DETECTED NOT DETECTED Final   Enterococcus Faecium NOT DETECTED NOT DETECTED Final   Listeria monocytogenes NOT DETECTED NOT DETECTED Final   Staphylococcus  species DETECTED (A) NOT DETECTED Final    Comment: CRITICAL RESULT CALLED TO, READ BACK BY AND VERIFIED WITH: PHARMD G ABBOTT 10/22/2023 @ 0610 BY AB    Staphylococcus aureus (BCID) DETECTED (A) NOT DETECTED Final    Comment: CRITICAL RESULT CALLED TO, READ BACK BY AND VERIFIED WITH: PHARMD G ABBOTT 10/22/2023 @ 0610 BY AB  Staphylococcus epidermidis NOT DETECTED NOT DETECTED Final   Staphylococcus lugdunensis NOT DETECTED NOT DETECTED Final   Streptococcus species NOT DETECTED NOT DETECTED Final   Streptococcus agalactiae NOT DETECTED NOT DETECTED Final   Streptococcus pneumoniae NOT DETECTED NOT DETECTED Final   Streptococcus pyogenes NOT DETECTED NOT DETECTED Final   A.calcoaceticus-baumannii NOT DETECTED NOT DETECTED Final   Bacteroides fragilis NOT DETECTED NOT DETECTED Final   Enterobacterales NOT DETECTED NOT DETECTED Final   Enterobacter cloacae complex NOT DETECTED NOT DETECTED Final   Escherichia coli NOT DETECTED NOT DETECTED Final   Klebsiella aerogenes NOT DETECTED NOT DETECTED Final   Klebsiella oxytoca NOT DETECTED NOT DETECTED Final   Klebsiella pneumoniae NOT DETECTED NOT DETECTED Final   Proteus species NOT DETECTED NOT DETECTED Final   Salmonella species NOT DETECTED NOT DETECTED Final   Serratia marcescens NOT DETECTED NOT DETECTED Final   Haemophilus influenzae NOT DETECTED NOT DETECTED Final   Neisseria meningitidis NOT DETECTED NOT DETECTED Final   Pseudomonas aeruginosa NOT DETECTED NOT DETECTED Final   Stenotrophomonas maltophilia NOT DETECTED NOT DETECTED Final   Candida albicans NOT DETECTED NOT DETECTED Final   Candida auris NOT DETECTED NOT DETECTED Final   Candida glabrata NOT DETECTED NOT DETECTED Final   Candida krusei NOT DETECTED NOT DETECTED Final   Candida parapsilosis NOT DETECTED NOT DETECTED Final   Candida tropicalis NOT DETECTED NOT DETECTED Final   Cryptococcus neoformans/gattii NOT DETECTED NOT DETECTED Final   Meth resistant mecA/C and  MREJ NOT DETECTED NOT DETECTED Final    Comment: Performed at [facility], [address], [place], [place] [address]  Blood Cultures x 2 sites     Status: Abnormal   Collection Time: 10/21/23 12:19 PM   Specimen: BLOOD  Result Value Ref Range Status   Specimen Description   Final    BLOOD BLOOD RIGHT ARM Performed at [facility], [address], [place], [place] [address]    Special Requests   Final    BOTTLES DRAWN AEROBIC AND ANAEROBIC Blood Culture adequate volume Performed at [facility], [address], [place], [place] [address]    Culture  Setup Time   Final    GRAM POSITIVE COCCI IN CLUSTERS ANAEROBIC BOTTLE ONLY CRITICAL VALUE NOTED.  VALUE IS CONSISTENT WITH PREVIOUSLY REPORTED AND CALLED VALUE.    Culture (A)  Final    STAPHYLOCOCCUS AUREUS SUSCEPTIBILITIES PERFORMED ON PREVIOUS CULTURE WITHIN THE LAST 5 DAYS. Performed at [facility], [address], [place], [place] [address]    Report Status 10/24/2023 FINAL  Final  Body fluid culture w Gram Stain     Status: None   Collection Time: 10/21/23  1:59 PM   Specimen: Synovium; Synovial Fluid  Result Value Ref Range Status   Specimen Description   Final    SYNOVIAL Performed at [facility], [address], [place], [place] [address]    Special Requests   Final    NONE LEFT ELBOW Performed at [facility], [address], [place], [place] [address]    Gram Stain   Final    ABUNDANT WBC PRESENT,BOTH PMN AND MONONUCLEAR ABUNDANT GRAM POSITIVE COCCI Performed at [facility], [address] [place], [place] [address]    Culture ABUNDANT STAPHYLOCOCCUS AUREUS  Final   Report Status 10/23/2023 FINAL  Final   Organism ID, Bacteria STAPHYLOCOCCUS AUREUS  Final      Susceptibility   Staphylococcus aureus -  MIC*    CIPROFLOXACIN >=8 RESISTANT Resistant     ERYTHROMYCIN >=8 RESISTANT Resistant     GENTAMICIN <=0.5  SENSITIVE Sensitive     OXACILLIN 0.5 SENSITIVE Sensitive     TETRACYCLINE <=1 SENSITIVE Sensitive     VANCOMYCIN  <=0.5 SENSITIVE Sensitive     TRIMETH /SULFA  >=320 RESISTANT Resistant     CLINDAMYCIN  <=0.25 SENSITIVE Sensitive     RIFAMPIN <=0.5 SENSITIVE Sensitive     Inducible Clindamycin  NEGATIVE Sensitive     LINEZOLID  2 SENSITIVE Sensitive     * ABUNDANT STAPHYLOCOCCUS AUREUS  Gram stain     Status: None   Collection Time: 10/21/23  1:59 PM   Specimen: Synovium; Body Fluid  Result Value Ref Range Status   Specimen Description SYNOVIAL  Final   Special Requests LEFT ELBOW  Final   Gram Stain   Final    ABUNDANT WBC SEEN GRAM POSITIVE COCCI Gram Stain Report Called to,Read Back By and Verified With: FRANCHOT BROCKS RN AT 1529 ON 10/21/2023 BY PRUDY POUR Performed at [facility], [address] [place], [place] [address]    Report Status 10/21/2023 FINAL  Final  Aerobic/Anaerobic Culture w Gram Stain (surgical/deep wound)     Status: None   Collection Time: 10/23/23  2:37 PM   Specimen: Synovial, Left Elbow; Body Fluid  Result Value Ref Range Status   Specimen Description SYNOVIAL  Final   Special Requests SWAB OF LEFT ELBOW SYNOVIAL FLUID  Final   Gram Stain   Final    ABUNDANT WBC PRESENT, PREDOMINANTLY PMN RARE GRAM POSITIVE COCCI IN PAIRS Gram Stain Report Called to,Read Back By and Verified With: RN B. HICKLIN  [id number]@ 2036 FH    Culture   Final    MODERATE STAPHYLOCOCCUS AUREUS NO ANAEROBES ISOLATED Performed at [facility], [address], [place], [place] [address]    Report Status 10/28/2023 FINAL  Final   Organism ID, Bacteria STAPHYLOCOCCUS AUREUS  Final      Susceptibility   Staphylococcus aureus - MIC*    CIPROFLOXACIN >=8 RESISTANT Resistant     ERYTHROMYCIN >=8 RESISTANT Resistant     GENTAMICIN <=0.5 SENSITIVE Sensitive     OXACILLIN 0.5 SENSITIVE Sensitive     TETRACYCLINE <=1 SENSITIVE Sensitive     VANCOMYCIN  1 SENSITIVE Sensitive      TRIMETH /SULFA  >=320 RESISTANT Resistant     CLINDAMYCIN  <=0.25 SENSITIVE Sensitive     RIFAMPIN <=0.5 SENSITIVE Sensitive     Inducible Clindamycin  NEGATIVE Sensitive     LINEZOLID  2 SENSITIVE Sensitive     * MODERATE STAPHYLOCOCCUS AUREUS  Aerobic/Anaerobic Culture w Gram Stain (surgical/deep wound)     Status: None   Collection Time: 10/23/23  3:20 PM   Specimen: Soft Tissue, Other  Result Value Ref Range Status   Specimen Description TISSUE  Final   Special Requests OLECRANON BURSA  Final   Gram Stain   Final    NO WBC SEEN RARE GRAM POSITIVE COCCI IN PAIRS Gram Stain Report Called to,Read Back By and Verified With: RN EMERSON BURY [phone] @ [phone] FH    Culture   Final    RARE STAPHYLOCOCCUS AUREUS RARE STAPHYLOCOCCUS EPIDERMIDIS NO ANAEROBES ISOLATED Performed at [facility], [address] [place], [place] [address]    Report Status 10/28/2023 FINAL  Final   Organism ID, Bacteria STAPHYLOCOCCUS AUREUS  Final   Organism ID, Bacteria STAPHYLOCOCCUS EPIDERMIDIS  Final      Susceptibility   Staphylococcus  aureus - MIC*    CIPROFLOXACIN >=8 RESISTANT Resistant     ERYTHROMYCIN >=8 RESISTANT Resistant     GENTAMICIN <=0.5 SENSITIVE Sensitive     OXACILLIN 0.5 SENSITIVE Sensitive     TETRACYCLINE <=1 SENSITIVE Sensitive     VANCOMYCIN  <=0.5 SENSITIVE Sensitive     TRIMETH /SULFA  >=320 RESISTANT Resistant     CLINDAMYCIN  <=0.25 SENSITIVE Sensitive     RIFAMPIN <=0.5 SENSITIVE Sensitive     Inducible Clindamycin  NEGATIVE Sensitive     LINEZOLID  2 SENSITIVE Sensitive     * RARE STAPHYLOCOCCUS AUREUS   Staphylococcus epidermidis - MIC*    CIPROFLOXACIN <=0.5 SENSITIVE Sensitive     ERYTHROMYCIN <=0.25 SENSITIVE Sensitive     GENTAMICIN <=0.5 SENSITIVE Sensitive     OXACILLIN <=0.25 SENSITIVE Sensitive     TETRACYCLINE 2 SENSITIVE Sensitive     VANCOMYCIN  2 SENSITIVE Sensitive     TRIMETH /SULFA  <=10 SENSITIVE Sensitive     CLINDAMYCIN  <=0.25 SENSITIVE Sensitive      RIFAMPIN <=0.5 SENSITIVE Sensitive     Inducible Clindamycin  NEGATIVE Sensitive     * RARE STAPHYLOCOCCUS EPIDERMIDIS  Culture, blood (Routine X 2) w Reflex to ID Panel     Status: None   Collection Time: 10/23/23 10:13 PM   Specimen: BLOOD  Result Value Ref Range Status   Specimen Description BLOOD SITE NOT SPECIFIED  Final   Special Requests   Final    BOTTLES DRAWN AEROBIC AND ANAEROBIC Blood Culture adequate volume   Culture   Final    NO GROWTH 5 DAYS Performed at [facility], [address] [place], [place] [address]    Report Status 10/28/2023 FINAL  Final  Culture, blood (Routine X 2) w Reflex to ID Panel     Status: None   Collection Time: 10/23/23 10:15 PM   Specimen: BLOOD  Result Value Ref Range Status   Specimen Description BLOOD SITE NOT SPECIFIED  Final   Special Requests   Final    BOTTLES DRAWN AEROBIC AND ANAEROBIC Blood Culture adequate volume   Culture   Final    NO GROWTH 5 DAYS Performed at [facility], [address], [place], [place] [address]    Report Status 10/28/2023 FINAL  Final   Imaging          Assessment/Plan # Complicated MSSA bacteremia # Left elbow septic  bursitis # Lumbar epidural abscess  - 4/22 blood cx cleared  - 4/15 TTE and 4/23 TEE negative for endocarditis  - 4/22 s/p I and D with hardware removal with one screw remaining. Or Cx MSSA and MSSE - no surgical intervention per neurosurgery - S/p IV cefazolin  through 6/17 > PO cefadroxil > linezolid  for a week during  OV with Dr Overton on 6/23 for concerns of Left elbow followed by PO cefadroxil   till date  Plan  - Continue po cefadroxil  as is  - I have messaged Dr Celena if any concerns for hardware infection in the remaining screw in left elbow. If not, will plan to stop antibiotics soon.  - fu in a month   # Chronic back pain - follows pain clinic and pain management per them  # Medication Management  - 7/24 CBC and CMP reviewed and discussed - ESR and CRP  today  # MDS w thrombocytopenia  - on retacrit  every 2 weeks  - Follows Oncology, last seen 7/24  I spent 25  minutes involved in face-to-face and non-face-to-face activities for this patient  on the day of the visit. Professional time spent includes the following activities: Preparing to see the patient (review of tests), Obtaining and reviewing separately obtained history ( last 2 notes from Dr Overton, last Oncology note from Dr Federico), communicating with Dr Celena, Performing a medically appropriate examination and evaluation, Documenting clinical information in the EMR, Independently interpreting results (not separately reported), Communicating results to the patient/daughter, Counseling and educating the patient/daughter and Care coordination (not separately reported).   Of note, portions of this note may have been created with voice recognition software. While this note has been edited for accuracy, occasional wrong-word or 'sound-a-like' substitutions may have occurred due to the inherent limitations of voice recognition software.   Annalee Joseph, MD [facility] 02/04/2024, 10:49 AM

## 2024-02-05 LAB — SEDIMENTATION RATE: Sed Rate: 2 mm/h (ref 0–20)

## 2024-02-05 LAB — C-REACTIVE PROTEIN: CRP: <3 mg/L (ref <8.0)

## 2024-02-07 ENCOUNTER — Inpatient Hospital Stay

## 2024-02-07 ENCOUNTER — Encounter (HOSPITAL_COMMUNITY): Payer: Self-pay

## 2024-02-07 ENCOUNTER — Inpatient Hospital Stay: Attending: Physician Assistant

## 2024-02-07 ENCOUNTER — Inpatient Hospital Stay: Admitting: Physician Assistant

## 2024-02-07 ENCOUNTER — Other Ambulatory Visit: Payer: Self-pay

## 2024-02-07 ENCOUNTER — Encounter (HOSPITAL_COMMUNITY): Payer: Self-pay | Admitting: Internal Medicine

## 2024-02-07 ENCOUNTER — Inpatient Hospital Stay (HOSPITAL_COMMUNITY)
Admission: AD | Admit: 2024-02-07 | Discharge: 2024-02-09 | DRG: 603 | Disposition: A | Discharge status: Home / Self Care | Source: Ambulatory Visit | Attending: Internal Medicine | Admitting: Internal Medicine

## 2024-02-07 ENCOUNTER — Other Ambulatory Visit: Payer: Self-pay | Admitting: Hematology and Oncology

## 2024-02-07 VITALS — BP 146/72 | HR 87 | Temp 97.9°F | Resp 18 | Wt 163.0 lb

## 2024-02-07 VITALS — BP 150/73 | HR 68 | Temp 98.4°F | Resp 16

## 2024-02-07 DIAGNOSIS — D469 Myelodysplastic syndrome, unspecified: Secondary | ICD-10-CM

## 2024-02-07 DIAGNOSIS — Z9841 Cataract extraction status, right eye: Secondary | ICD-10-CM

## 2024-02-07 DIAGNOSIS — D4620 Refractory anemia with excess of blasts, unspecified: Secondary | ICD-10-CM | POA: Insufficient documentation

## 2024-02-07 DIAGNOSIS — Z881 Allergy status to other antibiotic agents status: Secondary | ICD-10-CM

## 2024-02-07 DIAGNOSIS — Z792 Long term (current) use of antibiotics: Secondary | ICD-10-CM

## 2024-02-07 DIAGNOSIS — Z9842 Cataract extraction status, left eye: Secondary | ICD-10-CM

## 2024-02-07 DIAGNOSIS — N400 Enlarged prostate without lower urinary tract symptoms: Secondary | ICD-10-CM | POA: Diagnosis present

## 2024-02-07 DIAGNOSIS — L02416 Cutaneous abscess of left lower limb: Principal | ICD-10-CM | POA: Diagnosis present

## 2024-02-07 DIAGNOSIS — Z87891 Personal history of nicotine dependence: Secondary | ICD-10-CM

## 2024-02-07 DIAGNOSIS — Z85828 Personal history of other malignant neoplasm of skin: Secondary | ICD-10-CM

## 2024-02-07 DIAGNOSIS — D61818 Other pancytopenia: Secondary | ICD-10-CM | POA: Diagnosis present

## 2024-02-07 DIAGNOSIS — Z8673 Personal history of transient ischemic attack (TIA), and cerebral infarction without residual deficits: Secondary | ICD-10-CM

## 2024-02-07 DIAGNOSIS — Z85820 Personal history of malignant melanoma of skin: Secondary | ICD-10-CM

## 2024-02-07 DIAGNOSIS — H353210 Exudative age-related macular degeneration, right eye, stage unspecified: Secondary | ICD-10-CM | POA: Diagnosis present

## 2024-02-07 DIAGNOSIS — Z79899 Other long term (current) drug therapy: Secondary | ICD-10-CM | POA: Insufficient documentation

## 2024-02-07 DIAGNOSIS — Z8619 Personal history of other infectious and parasitic diseases: Secondary | ICD-10-CM

## 2024-02-07 DIAGNOSIS — L03116 Cellulitis of left lower limb: Principal | ICD-10-CM | POA: Diagnosis present

## 2024-02-07 DIAGNOSIS — Z96651 Presence of right artificial knee joint: Secondary | ICD-10-CM | POA: Diagnosis present

## 2024-02-07 DIAGNOSIS — H409 Unspecified glaucoma: Secondary | ICD-10-CM | POA: Diagnosis present

## 2024-02-07 DIAGNOSIS — Z801 Family history of malignant neoplasm of trachea, bronchus and lung: Secondary | ICD-10-CM

## 2024-02-07 DIAGNOSIS — G894 Chronic pain syndrome: Secondary | ICD-10-CM | POA: Diagnosis present

## 2024-02-07 DIAGNOSIS — G2581 Restless legs syndrome: Secondary | ICD-10-CM | POA: Diagnosis present

## 2024-02-07 DIAGNOSIS — Z8661 Personal history of infections of the central nervous system: Secondary | ICD-10-CM

## 2024-02-07 DIAGNOSIS — Z803 Family history of malignant neoplasm of breast: Secondary | ICD-10-CM

## 2024-02-07 LAB — CMP (CANCER CENTER ONLY)
ALT: 8 U/L (ref 0–44)
AST: 14 U/L — ABNORMAL LOW (ref 15–41)
Albumin: 4.1 g/dL (ref 3.5–5.0)
Alkaline Phosphatase: 66 U/L (ref 38–126)
Anion gap: 5 (ref 5–15)
BUN: 27 mg/dL — ABNORMAL HIGH (ref 8–23)
CO2: 29 mmol/L (ref 22–32)
Calcium: 9.1 mg/dL (ref 8.9–10.3)
Chloride: 104 mmol/L (ref 98–111)
Creatinine: 1 mg/dL (ref 0.61–1.24)
GFR, Estimated: >60 mL/min (ref >60)
Glucose, Bld: 94 mg/dL (ref 70–99)
Potassium: 3.8 mmol/L (ref 3.5–5.1)
Sodium: 138 mmol/L (ref 135–145)
Total Bilirubin: 0.7 mg/dL (ref 0.0–1.2)
Total Protein: 6.8 g/dL (ref 6.5–8.1)

## 2024-02-07 LAB — CBC WITH DIFFERENTIAL (CANCER CENTER ONLY)
Abs Immature Granulocytes: 0.03 K/uL (ref 0.00–0.07)
Basophils Absolute: 0 K/uL (ref 0.0–0.1)
Basophils Relative: 0 %
Eosinophils Absolute: 0 K/uL (ref 0.0–0.5)
Eosinophils Relative: 0 %
HCT: 30.7 % — ABNORMAL LOW (ref 39.0–52.0)
Hemoglobin: 9.7 g/dL — ABNORMAL LOW (ref 13.0–17.0)
Immature Granulocytes: 0 %
Lymphocytes Relative: 8 %
Lymphs Abs: 0.9 K/uL (ref 0.7–4.0)
MCH: 25.6 pg — ABNORMAL LOW (ref 26.0–34.0)
MCHC: 31.6 g/dL (ref 30.0–36.0)
MCV: 81 fL (ref 80.0–100.0)
Monocytes Absolute: 3.6 K/uL — ABNORMAL HIGH (ref 0.1–1.0)
Monocytes Relative: 32 %
Neutro Abs: 7 K/uL (ref 1.7–7.7)
Neutrophils Relative %: 60 %
Platelet Count: 29 K/uL — ABNORMAL LOW (ref 150–400)
RBC: 3.79 MIL/uL — ABNORMAL LOW (ref 4.22–5.81)
RDW: 15.9 % — ABNORMAL HIGH (ref 11.5–15.5)
Smear Review: NORMAL
WBC Count: 11.5 K/uL — ABNORMAL HIGH (ref 4.0–10.5)
nRBC: 0 % (ref 0.0–0.2)

## 2024-02-07 MED ORDER — TAMSULOSIN HCL 0.4 MG PO CAPS
0.4000 mg | ORAL_CAPSULE | Freq: Every morning | ORAL | Status: DC
  Administered 2024-02-08 – 2024-02-09 (×2): 0.4 mg via ORAL
  Filled 2024-02-07 (×2): qty 1

## 2024-02-07 MED ORDER — OXYCODONE HCL 5 MG PO TABS
10.0000 mg | ORAL_TABLET | Freq: Every day | ORAL | Status: DC
  Administered 2024-02-07 – 2024-02-08 (×2): 10 mg via ORAL
  Filled 2024-02-07 (×2): qty 2

## 2024-02-07 MED ORDER — CALCIUM CARBONATE ANTACID 500 MG PO CHEW: 1500.0000 mg | CHEWABLE_TABLET | Freq: Every day | ORAL | Status: DC | PRN

## 2024-02-07 MED ORDER — TORSEMIDE 20 MG PO TABS: 50.0000 mg | ORAL_TABLET | Freq: Every day | ORAL | Status: DC

## 2024-02-07 MED ORDER — CEFAZOLIN SODIUM-DEXTROSE 2-4 GM/100ML-% IV SOLN
2.0000 g | Freq: Three times a day (TID) | INTRAVENOUS | Status: DC
  Administered 2024-02-07 – 2024-02-09 (×6): 2 g via INTRAVENOUS
  Filled 2024-02-07 (×6): qty 100

## 2024-02-07 MED ORDER — ONDANSETRON HCL 4 MG PO TABS: 4.0000 mg | ORAL_TABLET | Freq: Four times a day (QID) | ORAL | Status: DC | PRN

## 2024-02-07 MED ORDER — PRAVASTATIN SODIUM 20 MG PO TABS
20.0000 mg | ORAL_TABLET | Freq: Every day | ORAL | Status: DC
  Administered 2024-02-07 – 2024-02-09 (×3): 20 mg via ORAL
  Filled 2024-02-07 (×3): qty 1

## 2024-02-07 MED ORDER — GABAPENTIN 300 MG PO CAPS
300.0000 mg | ORAL_CAPSULE | Freq: Three times a day (TID) | ORAL | Status: DC
  Administered 2024-02-07 – 2024-02-09 (×5): 300 mg via ORAL
  Filled 2024-02-07 (×5): qty 1

## 2024-02-07 MED ORDER — TERAZOSIN HCL 2 MG PO CAPS
2.0000 mg | ORAL_CAPSULE | Freq: Every day | ORAL | Status: DC
  Administered 2024-02-07 – 2024-02-08 (×2): 2 mg via ORAL
  Filled 2024-02-07 (×2): qty 1

## 2024-02-07 MED ORDER — CARBIDOPA-LEVODOPA 25-100 MG PO TABS
1.0000 | ORAL_TABLET | Freq: Every evening | ORAL | Status: DC | PRN
  Administered 2024-02-07: 1 via ORAL
  Filled 2024-02-07 (×2): qty 1

## 2024-02-07 MED ORDER — EPOETIN ALFA 20000 UNIT/ML IJ SOLN
20000.0000 [IU] | Freq: Once | INTRAMUSCULAR | Status: AC
  Administered 2024-02-07: 20000 [IU] via SUBCUTANEOUS
  Filled 2024-02-07: qty 1

## 2024-02-07 MED ORDER — ACETAMINOPHEN 500 MG PO TABS
1000.0000 mg | ORAL_TABLET | Freq: Four times a day (QID) | ORAL | Status: DC | PRN
  Administered 2024-02-09: 1000 mg via ORAL
  Filled 2024-02-07: qty 2

## 2024-02-07 MED ORDER — VITAMIN D3 25 MCG (1000 UNIT) PO TABS
3000.0000 [IU] | ORAL_TABLET | Freq: Every day | ORAL | Status: DC
  Administered 2024-02-07 – 2024-02-09 (×3): 3000 [IU] via ORAL
  Filled 2024-02-07 (×3): qty 3

## 2024-02-07 MED ORDER — FINASTERIDE 5 MG PO TABS
5.0000 mg | ORAL_TABLET | Freq: Every day | ORAL | Status: DC
  Administered 2024-02-07 – 2024-02-09 (×3): 5 mg via ORAL
  Filled 2024-02-07 (×3): qty 1

## 2024-02-07 MED ORDER — ROPINIROLE HCL 1 MG PO TABS
1.0000 mg | ORAL_TABLET | Freq: Three times a day (TID) | ORAL | Status: DC
  Administered 2024-02-07 – 2024-02-09 (×5): 1 mg via ORAL
  Filled 2024-02-07 (×5): qty 1

## 2024-02-07 MED ORDER — AMLODIPINE BESYLATE 5 MG PO TABS
5.0000 mg | ORAL_TABLET | Freq: Every day | ORAL | Status: DC
  Administered 2024-02-07 – 2024-02-08 (×2): 5 mg via ORAL
  Filled 2024-02-07 (×2): qty 1

## 2024-02-07 NOTE — Progress Notes (Signed)
 Pt's WBC noted to be elevated at 11.5 today, up from 2.6 on 01/24/24. Pt asked how he was feeling today. Explained to him that an elevated wbc can indicate a possible infection. He reported that his leg was bothering him. This author asked patient to lift up his pants leg and his left leg was noticeably red and swollen. Asked pt to have a seat in chair while his onc nurse was notified and retrieved to come assess him. Landry Arabia, RN came to chairside in flush room to assess patient's leg. This nurse made an appt with [facility] RN Elenor. Patient agreeable to plan to see [facility] provider at 2:00. Pt stated that he would wait here in blue room instead of going. Pt offered food/drink while waiting; he declined. No distress noted. Pt ambulated to waiting area with his rollator with no issues.

## 2024-02-07 NOTE — Progress Notes (Signed)
 Symptom Management Consult Note Justin [name]    Patient Care Team: Plotnikov, Karlynn GAILS, MD as PCP - General Justin Soyla LABOR, MD as PCP - Cardiology (Cardiology) Rollin Dover, MD as Consulting Physician (Gastroenterology) Sw[place], Amy, MD as Consulting Physician (Dermatology) Justin Tonita POUR, DO as Consulting Physician (Neurology) Bonner Ade, MD as Consulting Physician (Physical Medicine and Rehabilitation) Ernie Cough, MD as Consulting Physician (Orthopedic Surgery) Szabat, Toribio BROCKS, [name] (Inactive) as Pharmacist (Pharmacist) Justin [name] Norleen ONEIDA MADISON, MD as Consulting Physician (Hematology and Oncology) Justin [name] Sharper, MD as Consulting Physician (Orthopedic Surgery)    Name / MRN / DOB: Justin [name]  [id number]  [birth year]   Date of visit: 02/07/2024   Chief Complaint/Reason for visit: leg swelling   Current Therapy: Epogen  injections q2 weeks  Last treatment:  Treatment 52 on 02/07/24    ASSESSMENT AND PLAN Patient is a 83 y.o. male with oncologic history of myelodysplastic Syndrome, Single Lineage Dysplasia followed by Dr. Federico.  I have viewed most recent oncology note and lab work.  #Myelodysplastic Syndrome, Single Lineage Dysplasia  - Here for schedule Retacrit  injection today - Next appointment with oncologist is 04/17/24  #Thrombocytopenia - Platelet count today is 29k. Has ranged from 27-50k over the last 2 months. No active bleeding per patient. Plan per last oncology note 01/24/24: Will continue to monitor as long as platelet count is >20K. Could consider Promacta vs Nplate injections if Plt levels drop    #Acute left lower extremity cellulitis - Chart review shows admission 04/20-04/25/25 for septic arthritis of left elbow, MSSA bacteremia, epidural abscess. He received Cefazolin  through PICC. He had a PO course of doxycycline  from PCP in May. Has been taking PO Cefadroxil  since 12/31/23 - Today patient is non toxic appearing. Afebrile,  VSS. - Acute erythema and swelling in left lower extremity with leukocytosis 11.2 suggestive of infection. Rapid progression despite being on PO cefadroxil  indicates need for hospitalization. - Will collect blood cultures in clinic - Shared visit with Dr. Federico who agrees   Spoke with Dr. Madelyne with hospitalist service who agrees to assume care of patient and bring into the hospital for further evaluation and management.         HEME/ONC HISTORY Oncology History   No history exists.      INTERVAL HISTORY  Discussed the use of AI scribe software for clinical note transcription with the patient, who gave verbal consent to proceed.    [facility] Helinski Justin [name]. is a 83 y.o. male with oncologic history of MDS presenting to [name] today with chief complaint of left leg swelling. Patient presents unaccompanied to visit today. Daughter Justin [name] provided history via phone.  Per patient he has been experiencing leg issues since a recent hospitalization for an infection. Initially, he was on a PICC line for antibiotic administration and after discharge, he transitioned to oral antibiotics, specifically cefadroxil , taken twice daily. He had a clinic appointment with Infectious Disease x 3 days ago and at that time his left leg had no redness or swelling. He reports the redness appeared on his left leg overnight. His left leg is typically larger than the right however swelling has increased over the last 24 hours.  Yesterday, he felt unwell, experiencing weakness and lack of energy, particularly when walking, as his left leg would 'give away.' No fever or chills are present, and there are no cuts or injuries on the leg. He recently completed a course of prednisone  for back pain, which did  not alleviate his symptoms.      ROS  All other systems are reviewed and are negative for acute change except as noted in the HPI.    Allergies  Allergen Reactions   Levofloxacin Other (See Comments)     REACTION: hands pealed     Past Medical History:  Diagnosis Date   Alcoholism (HCC)    Dry 13 years   Basal cell carcinoma    Right Chest   BPH (benign prostatic hyperplasia)    Diverticulosis of colon    Dizzy spells    none recent   DJD (degenerative joint disease)    lower back   Elbow fracture, left 2009   Dr Justin [name]   FRACTURE, ARM, LEFT 12/23/2007   Qualifier: Diagnosis of   By: Justin [name]        GERD (gastroesophageal reflux disease)    Glaucoma    History of TIAs 1 yrs ago   Hyperkeratosis    LBP (low back pain)    Macular degeneration    left wet right dry sees dr [name]   Melanoma [facility]) 2009   x3 Dr Amy Swaz[place]   Osteoarthritis    Restless leg syndrome    Thrombocytopenia (HCC) 11/18/2021   Tinnitus    Tobacco abuse    Uses walker    Wears glasses    reading   Wears partial dentures    upper     Past Surgical History:  Procedure Laterality Date   CATARACT EXTRACTION Bilateral 2018   HARDWARE REMOVAL Left 07/09/2020   Procedure: Left elbow hardware removal;  Surgeon: Justin Selinda Dover, MD;  Location: [name];  Service: Orthopedics;  Laterality: Left;  60 mins   HARDWARE REMOVAL Left 10/23/2023   Procedure: REMOVAL, HARDWARE;  Surgeon: Justin [name] Sharper, MD;  Location: [facility] OR;  Service: Orthopedics;  Laterality: Left;   IRRIGATION AND DEBRIDEMENT ELBOW Left 10/23/2023   Procedure: IRRIGATION AND DEBRIDEMENT ELBOW;  Surgeon: Justin [name] Sharper, MD;  Location: [facility] OR;  Service: Orthopedics;  Laterality: Left;   mda     Justin [name] wet injection   MELANOMA EXCISION  2009   x3   RECONSTRUCTION MEDIAL COLLATERAL LIGAMENT ELBOW W/ TENDON GRAFT  2009   Left, Dr Justin [name]   TONSILLECTOMY  as child   TOTAL KNEE ARTHROPLASTY Right 11/09/2020   Procedure: TOTAL KNEE ARTHROPLASTY;  Surgeon: Ernie Cough, MD;  Location: [facility] ORS;  Service: Orthopedics;  Laterality: Right;   TRANSESOPHAGEAL ECHOCARDIOGRAM (CATH LAB) N/A 10/24/2023   Procedure:  TRANSESOPHAGEAL ECHOCARDIOGRAM;  Surgeon: Jeffrie Oneil BROCKS, MD;  Location: [facility] INVASIVE CV LAB;  Service: Cardiovascular;  Laterality: N/A;    Social History   Socioeconomic History   Marital status: Married    Spouse name: Rollene Caldron   Number of children: 2   Years of education: Not on file   Highest education level: Not on file  Occupational History   Occupation: [place] Veteran [employer]    Employer: RETIRED   Occupation: Scientist, research (physical sciences)   Occupation: Games developer  Tobacco Use   Smoking status: Former    Current packs/day: 0.00    Average packs/day: 3.0 packs/day for 38.0 years (114.0 ttl pk-yrs)    Types: Cigarettes    Start date: 30    Quit date: 1986    Years since quitting: 39.6   Smokeless tobacco: Never   Tobacco comments:    states he quit May 15 1985  Vaping Use   Vaping status:  Never Used  Substance and Sexual Activity   Alcohol  use: No    Comment: PREVIOUS - DRY 40yrs   Drug use: Not Currently   Sexual activity: Not Currently  Other Topics Concern   Not on file  Social History Narrative   Right handed   One story home   Drinks coffee q am      Son and wife lives with him-2025   Social Drivers of Health   Financial Resource Strain: Low Risk  (07/20/2023)   Overall Financial Resource Strain (CARDIA)    Difficulty of Paying Living Expenses: Not very hard  Food Insecurity: No Food Insecurity (10/21/2023)   Hunger Vital Sign    Worried About Running Out of Food in the Last Year: Never true    Ran Out of Food in the Last Year: Never true  Transportation Needs: No Transportation Needs (10/21/2023)   PRAPARE - Administrator, Civil Service (Medical): No    Lack of Transportation (Non-Medical): No  Physical Activity: Inactive (07/20/2023)   Exercise Vital Sign    Days of Exercise per Week: 0 days    Minutes of Exercise per Session: 0 min  Stress: No Stress Concern Present (07/20/2023)   [employer] of Occupational Health -  Occupational Stress Questionnaire    Feeling of Stress : Not at all  Social Connections: Moderately Isolated (10/21/2023)   Social Connection and Isolation Panel    Frequency of Communication with Friends and Family: More than three times a week    Frequency of Social Gatherings with Friends and Family: Three times a week    Attends Religious Services: Never    Active Member of Clubs or Organizations: No    Attends Banker Meetings: Never    Marital Status: Married  Catering manager Violence: Not At Risk (10/21/2023)   Humiliation, Afraid, Rape, and Kick questionnaire    Fear of Current or Ex-Partner: No    Emotionally Abused: No    Physically Abused: No    Sexually Abused: No    Family History  Problem Relation Age of Onset   Cancer Mother        ?breast   Cancer Father        Lung     Current Outpatient Medications:    acetaminophen  (TYLENOL ) 500 MG tablet, Take 1,000 mg by mouth every 6 (six) hours as needed for mild pain (pain score 1-3) or moderate pain (pain score 4-6)., Disp: , Rfl:    amLODipine  (NORVASC ) 5 MG tablet, TAKE 1 TABLET BY MOUTH DAILY, Disp: 90 tablet, Rfl: 3   calcium  carbonate (TUMS EX) 750 MG chewable tablet, Chew 1,500 mg by mouth daily as needed for heartburn., Disp: , Rfl:    carbidopa -levodopa  (SINEMET  IR) 25-100 MG tablet, For breakthrough restless leg, you can take 1 tablet at bedtime.  OK to take extra tab as needed during the day., Disp: 100 tablet, Rfl: 3   cefadroxil  (DURICEF) 500 MG capsule, Take 2 capsules (1,000 mg total) by mouth 2 (two) times daily., Disp: 120 capsule, Rfl: 3   Cholecalciferol  1000 UNITS tablet, Take 3,000 Units by mouth daily., Disp: , Rfl:    clotrimazole -betamethasone  (LOTRISONE ) cream, Apply 1 Application topically daily., Disp: 60 g, Rfl: 0   Cyanocobalamin  (VITAMIN B-12) 1000 MCG SUBL, DISSOLVE 2 TABLETS IN MOUTH EVERY DAY, Disp: 180 tablet, Rfl: 1   finasteride  (PROSCAR ) 5 MG tablet, TAKE 1 TABLET (5 MG  TOTAL) BY MOUTH DAILY., Disp: 90 tablet, Rfl:  1   gabapentin  (NEURONTIN ) 300 MG capsule, Take 1 capsule (300 mg total) by mouth 3 (three) times daily., Disp: , Rfl:    ibuprofen (ADVIL) 200 MG tablet, Take 400 mg by mouth every 6 (six) hours as needed for mild pain (pain score 1-3) or moderate pain (pain score 4-6)., Disp: , Rfl:    magnesium  hydroxide (MILK OF MAGNESIA) 400 MG/5ML suspension, Take 15 mLs by mouth daily as needed for mild constipation., Disp: , Rfl:    meclizine  (ANTIVERT ) 12.5 MG tablet, TAKE 1 TABLET BY MOUTH 3 TIMES DAILY AS NEEDED FOR DIZZINESS., Disp: 90 tablet, Rfl: 2   Multiple Vitamins-Minerals (PRESERVISION AREDS PO), Take 2 tablets by mouth 2 (two) times daily., Disp: , Rfl:    ondansetron  (ZOFRAN ) 4 MG tablet, Take 1 tablet (4 mg total) by mouth every 6 (six) hours as needed for nausea or vomiting., Disp: 30 tablet, Rfl: 0   Oxycodone  HCl 10 MG TABS, Take 1 tablet (10 mg total) by mouth 5 (five) times daily for 5 days., Disp: 25 tablet, Rfl: 0   Polyethyl Glycol-Propyl Glycol (SYSTANE OP), Place 1 drop into both eyes 2 (two) times daily as needed (dry eyes). [facility] OTC, Disp: , Rfl:    pravastatin  (PRAVACHOL ) 20 MG tablet, TAKE 1 TABLET (20 MG TOTAL) BY MOUTH DAILY., Disp: 90 tablet, Rfl: 1   predniSONE  (DELTASONE ) 10 MG tablet, Take 6 tablets po x1 day, 5 tablets po x 1 day, 4 tablets po x 1 day, 3 tablets po x 1 day, 2 tablets po x 1 day, 1 tablet po x 1 day, Disp: , Rfl:    rOPINIRole  (REQUIP ) 2 MG tablet, TAKE 1 TABLET BY MOUTH AT AT NOON AND 1 TAB AT 4PM AND 1 TAB AT BEDTIME (Patient taking differently: Take 1 mg by mouth in the morning, at noon, in the evening, and at bedtime.), Disp: 270 tablet, Rfl: 1   tamsulosin  (FLOMAX ) 0.4 MG CAPS capsule, TAKE 1 CAPSULE (0.4 MG TOTAL) BY MOUTH IN THE MORNING., Disp: 90 capsule, Rfl: 1   terazosin  (HYTRIN ) 2 MG capsule, TAKE 1 CAPSULE BY MOUTH EVERY NIGHT AT BEDTIME, Disp: 90 capsule, Rfl: 1   torsemide  (DEMADEX ) 100 MG tablet, Take  0.5-1 tablets (50-100 mg total) by mouth daily., Disp: 90 tablet, Rfl: 1  PHYSICAL EXAM ECOG FS:1 - Symptomatic but completely ambulatory    Vitals:   02/07/24 1322  BP: (!) 146/72  Pulse: 87  Resp: 18  Temp: 97.9 F (36.6 C)  SpO2: 100%  Weight: 163 lb (73.9 kg)   Physical Exam Vitals and nursing note reviewed.  Constitutional:      Appearance: He is not ill-appearing or toxic-appearing.  HENT:     Head: Normocephalic.  Eyes:     Conjunctiva/sclera: Conjunctivae normal.  Cardiovascular:     Rate and Rhythm: Normal rate.     Pulses: Normal pulses.  Pulmonary:     Effort: Pulmonary effort is normal.  Abdominal:     General: There is no distension.  Musculoskeletal:     Cervical back: Normal range of motion.     Comments: Left elbow without erythema. No wound.  LLE has nonpitting edema and tenderness. Circumferential erythema. No open wound visualized  Skin:    General: Skin is warm and dry.     Findings: Bruising present.     Comments: Please see media below  Neurological:     Mental Status: He is alert.  LABORATORY DATA I have reviewed the data as listed    Latest Ref Rng & Units 02/07/2024   10:56 AM 01/24/2024    9:39 AM 01/10/2024   11:09 AM  CBC  WBC 4.0 - 10.5 K/uL 11.5  2.6  2.8   Hemoglobin 13.0 - 17.0 g/dL 9.7  9.5  8.9   Hematocrit 39.0 - 52.0 % 30.7  30.2  27.8   Platelets 150 - 400 K/uL 29  33  28         Latest Ref Rng & Units 02/07/2024   10:56 AM 01/24/2024    9:39 AM 01/10/2024   11:09 AM  CMP  Glucose 70 - 99 mg/dL 94  892  91   BUN 8 - 23 mg/dL 27  23  17    Creatinine 0.61 - 1.24 mg/dL 8.99  8.81  8.96   Sodium 135 - 145 mmol/L 138  139  140   Potassium 3.5 - 5.1 mmol/L 3.8  4.3  4.5   Chloride 98 - 111 mmol/L 104  108  108   CO2 22 - 32 mmol/L 29  26  28    Calcium  8.9 - 10.3 mg/dL 9.1  8.9  9.1   Total Protein 6.5 - 8.1 g/dL 6.8  7.0  6.9   Total Bilirubin 0.0 - 1.2 mg/dL 0.7  0.7  0.5   Alkaline Phos 38 - 126 U/L 66   60  66   AST 15 - 41 U/L 14  16  14    ALT 0 - 44 U/L 8  <5  5        RADIOGRAPHIC STUDIES (from last 24 hours if applicable) I have personally reviewed the radiological images as listed and agreed with the findings in the report. No results found.      Visit Diagnosis: 1. Cellulitis of left lower extremity   2. MDS (myelodysplastic syndrome) (HCC)      Orders Placed This Encounter  Procedures   Culture, blood (Routine X 2) w Reflex to ID Panel   Culture, blood (Routine X 2) w Reflex to ID Panel    All questions were answered. The patient knows to call the clinic with any problems, questions or concerns. No barriers to learning was detected.  A total of more than 40 minutes were spent on this encounter with face-to-face time and non-face-to-face time, including preparing to see the patient, ordering tests and/or medications, counseling the patient and coordination of care as outlined above.    Thank you for allowing me to participate in the care of this patient.    Brittain Smithey E  Walisiewicz, PA-C Department of Hematology/Oncology [name] at [facility] Phone: [phone]  [phone]    02/07/2024 3:37 PM  I have read the above note and personally examined the patient. I agree with the assessment and plan as noted above.  Briefly Mr. Beal is an 83 year old male well-known to our clinic.  He currently follows with MDS and receives erythropoietin  shots.  Overall his blood counts have been stable.  He presents today with worsening of left lower extremity cellulitis.  On exam today he has a bright red rash between his knee and his ankle.  It is tender to the touch.  He reports that has been worsening.  There is not active weeping on our exam today.  He has been prescribed oral antibiotic therapy which has not been effective.  Due to the difficulty in treating his cellulitis I would  recommend admission for consideration of blood cultures and  IV antibiotics.  Additionally he would benefit from an ID consult.  The patient voiced understanding of our findings and recommendation.  He was agreeable to admission for further evaluation.   Norleen IVAR Kidney, MD Department of Hematology/Oncology [name] Phone: [phone] Pager: [phone] Email: [email or web address]

## 2024-02-07 NOTE — Plan of Care (Signed)

## 2024-02-07 NOTE — Progress Notes (Signed)
 This RN gave report to [employer] for rm [phone] at [facility].

## 2024-02-07 NOTE — H&P (Signed)
 History and Physical    Justin [name]. FMW:[id number] DOB: 07-27-1940 DOA: 02/07/2024  PCP: Garald Karlynn GAILS, MD  Patient coming from: Home, via cancer center.  I have personally briefly reviewed patient's old medical records available.   Chief Complaint: Left leg redness and swelling.  HPI: Justin Robling. is a 83 y.o. male with medical history significant of myelodysplastic syndrome currently on symptomatic management and getting Retacrit , thrombocytopenia with platelet ranged from 30-50.  Recent history of left elbow infection with removal of hardware, MSSA bacteremia and epidural abscess treated with Ancef  through PICC line and currently taking cefadroxil  presented to cancer center today with 1 day of worsening swelling of the left leg and now getting red.  He does have chronic issues including chronic pain on the back and on his scheduled oxycodone , he has some pain on the legs.  He always have some swelling of both legs, asymmetrical and left leg is always bigger than the right leg.  Last 2 days he started noticing more swelling with some discomfort and redness.  He was seen at cancer center today and blood cultures were drawn and patient sent to hospital for admission and IV antibiotics because he was getting cellulitis while already on prolonged cefadroxil . Patient himself today denies any complaints to me.  He does have some long-term issues about some weakness and swelling of the legs.  Denies any fever or chills. ED Course: Hemodynamically stable.  Blood pressures are adequate. WBC count is 11 with his baseline WBC count of 2-3 K Platelet counts are 29 which is at about baseline.   Review of Systems: all systems are reviewed and pertinent positive as per HPI otherwise rest are negative.    Past Medical History:  Diagnosis Date   Alcoholism (HCC)    Dry 13 years   Basal cell carcinoma    Right Chest   BPH (benign prostatic hyperplasia)    Diverticulosis of  colon    Dizzy spells    none recent   DJD (degenerative joint disease)    lower back   Elbow fracture, left 2009   Dr Celena   FRACTURE, [place], LEFT 12/23/2007   Qualifier: Diagnosis of   By: Georgian ROSALEA CHARM Lamar        GERD (gastroesophageal reflux disease)    Glaucoma    History of TIAs 1 yrs ago   Hyperkeratosis    LBP (low back pain)    Macular degeneration    left wet right dry sees dr elner   Melanoma [facility]) 2009   x3 Dr Amy [place]   Osteoarthritis    Restless leg syndrome    Thrombocytopenia (HCC) 11/18/2021   Tinnitus    Tobacco abuse    Uses walker    Wears glasses    reading   Wears partial dentures    upper    Past Surgical History:  Procedure Laterality Date   CATARACT EXTRACTION Bilateral 2018   HARDWARE REMOVAL Left 07/09/2020   Procedure: Left elbow hardware removal;  Surgeon: Sharl Selinda Dover, MD;  Location: [facility];  Service: Orthopedics;  Laterality: Left;  60 mins   HARDWARE REMOVAL Left 10/23/2023   Procedure: REMOVAL, HARDWARE;  Surgeon: Celena Sharper, MD;  Location: [facility] OR;  Service: Orthopedics;  Laterality: Left;   IRRIGATION AND DEBRIDEMENT ELBOW Left 10/23/2023   Procedure: IRRIGATION AND DEBRIDEMENT ELBOW;  Surgeon: Celena Sharper, MD;  Location: [facility] OR;  Service: Orthopedics;  Laterality: Left;   mda  Dr. Elner wet injection   MELANOMA EXCISION  2009   x3   RECONSTRUCTION MEDIAL COLLATERAL LIGAMENT ELBOW W/ TENDON GRAFT  2009   Left, Dr Celena   TONSILLECTOMY  as child   TOTAL KNEE ARTHROPLASTY Right 11/09/2020   Procedure: TOTAL KNEE ARTHROPLASTY;  Surgeon: Ernie Cough, MD;  Location: [facility] ORS;  Service: Orthopedics;  Laterality: Right;   TRANSESOPHAGEAL ECHOCARDIOGRAM (CATH LAB) N/A 10/24/2023   Procedure: TRANSESOPHAGEAL ECHOCARDIOGRAM;  Surgeon: Jeffrie Oneil BROCKS, MD;  Location: [facility] INVASIVE CV LAB;  Service: Cardiovascular;  Laterality: N/A;    Social history   reports that he quit smoking about 39 years ago. His smoking  use included cigarettes. He started smoking about 77 years ago. He has a 114 pack-year smoking history. He has never used smokeless tobacco. He reports that he does not currently use drugs. He reports that he does not drink alcohol .  Allergies  Allergen Reactions   Levofloxacin Other (See Comments)    REACTION: hands pealed    Family History  Problem Relation Age of Onset   Cancer Mother        ?breast   Cancer Father        Lung     Prior to Admission medications   Medication Sig Start Date End Date Taking? Authorizing Provider  acetaminophen  (TYLENOL ) 500 MG tablet Take 1,000 mg by mouth every 6 (six) hours as needed for mild pain (pain score 1-3) or moderate pain (pain score 4-6).    [provider]  amLODipine  (NORVASC ) 5 MG tablet TAKE 1 TABLET BY MOUTH DAILY 03/21/23   Plotnikov, Aleksei V, MD  calcium  carbonate (TUMS EX) 750 MG chewable tablet Chew 1,500 mg by mouth daily as needed for heartburn.    [provider]  carbidopa -levodopa  (SINEMET  IR) 25-100 MG tablet For breakthrough restless leg, you can take 1 tablet at bedtime.  OK to take extra tab as needed during the day. 03/22/20   Patel, Donika K, DO  cefadroxil  (DURICEF) 500 MG capsule Take 2 capsules (1,000 mg total) by mouth 2 (two) times daily. 01/24/24   Manandhar, Sabina, MD  Cholecalciferol  1000 UNITS tablet Take 3,000 Units by mouth daily.    [provider]  clotrimazole -betamethasone  (LOTRISONE ) cream Apply 1 Application topically daily. 12/04/23   Plotnikov, Karlynn GAILS, MD  Cyanocobalamin  (VITAMIN B-12) 1000 MCG SUBL DISSOLVE 2 TABLETS IN MOUTH EVERY DAY 05/10/21   Plotnikov, Aleksei V, MD  finasteride  (PROSCAR ) 5 MG tablet TAKE 1 TABLET (5 MG TOTAL) BY MOUTH DAILY. 08/24/23   Plotnikov, Aleksei V, MD  gabapentin  (NEURONTIN ) 300 MG capsule Take 1 capsule (300 mg total) by mouth 3 (three) times daily. 10/26/23   Laurence Locus, DO  ibuprofen (ADVIL) 200 MG tablet Take 400 mg by mouth every 6 (six)  hours as needed for mild pain (pain score 1-3) or moderate pain (pain score 4-6).    [provider]  magnesium  hydroxide (MILK OF MAGNESIA) 400 MG/5ML suspension Take 15 mLs by mouth daily as needed for mild constipation.    [provider]  meclizine  (ANTIVERT ) 12.5 MG tablet TAKE 1 TABLET BY MOUTH 3 TIMES DAILY AS NEEDED FOR DIZZINESS. 10/04/22   Plotnikov, Aleksei V, MD  Multiple Vitamins-Minerals (PRESERVISION AREDS PO) Take 2 tablets by mouth 2 (two) times daily.    [provider]  ondansetron  (ZOFRAN ) 4 MG tablet Take 1 tablet (4 mg total) by mouth every 6 (six) hours as needed for nausea or vomiting. 10/26/23   Laurence,  Eric, DO  Oxycodone  HCl 10 MG TABS Take 1 tablet (10 mg total) by mouth 5 (five) times daily for 5 days. 10/26/23 02/04/24  Laurence Locus, DO  Polyethyl Glycol-Propyl Glycol (SYSTANE OP) Place 1 drop into both eyes 2 (two) times daily as needed (dry eyes). [facility]    [provider]  pravastatin  (PRAVACHOL ) 20 MG tablet TAKE 1 TABLET (20 MG TOTAL) BY MOUTH DAILY. 08/24/23   Plotnikov, Aleksei V, MD  predniSONE  (DELTASONE ) 10 MG tablet Take 6 tablets po x1 day, 5 tablets po x 1 day, 4 tablets po x 1 day, 3 tablets po x 1 day, 2 tablets po x 1 day, 1 tablet po x 1 day 01/31/24   [provider]  rOPINIRole  (REQUIP ) 2 MG tablet TAKE 1 TABLET BY MOUTH AT AT NOON AND 1 TAB AT 4PM AND 1 TAB AT BEDTIME Patient taking differently: Take 1 mg by mouth in the morning, at noon, in the evening, and at bedtime. 10/11/22   Plotnikov, Karlynn GAILS, MD  tamsulosin  (FLOMAX ) 0.4 MG CAPS capsule TAKE 1 CAPSULE (0.4 MG TOTAL) BY MOUTH IN THE MORNING. 11/22/23   Plotnikov, Aleksei V, MD  terazosin  (HYTRIN ) 2 MG capsule TAKE 1 CAPSULE BY MOUTH EVERY NIGHT AT BEDTIME 09/21/23   Plotnikov, Aleksei V, MD  torsemide  (DEMADEX ) 100 MG tablet Take 0.5-1 tablets (50-100 mg total) by mouth daily. 11/07/23   Plotnikov, Karlynn GAILS, MD    Physical Exam: Vitals:   02/07/24 1556 02/07/24  1620  BP:  131/72  Pulse:  68  Resp:  16  Temp:  98 F (36.7 C)  SpO2:  100%  Weight: 72.5 kg   Height: 5' 9 (1.753 m)     Constitutional: NAD, calm, comfortable Vitals:   02/07/24 1556 02/07/24 1620  BP:  131/72  Pulse:  68  Resp:  16  Temp:  98 F (36.7 C)  SpO2:  100%  Weight: 72.5 kg   Height: 5' 9 (1.753 m)    Eyes: PERRL, lids and conjunctivae normal ENMT: Mucous membranes are moist. Posterior pharynx clear of any exudate or lesions.Normal dentition.  Neck: normal, supple, no masses, no thyromegaly Respiratory: clear to auscultation bilaterally, no wheezing, no crackles. Normal respiratory effort. No accessory muscle use.  Cardiovascular: Regular rate and rhythm, no murmurs / rubs / gallops. No extremity edema. 2+ pedal pulses. No carotid bruits.  Abdomen: no tenderness, no masses palpated. No hepatosplenomegaly. Bowel sounds positive.  Musculoskeletal: no clubbing / cyanosis. No joint deformity upper and lower extremities. Good ROM, no contractures. Normal muscle tone.  Neurologic: CN 2-12 grossly intact. Sensation intact, DTR normal. Strength 5/5 in all 4.  Psychiatric: Normal judgment and insight. Alert and oriented x 3. Normal mood.  Skin: Patient has 1+ chronic edema and lymphedema on the right leg. Patient has 2+ edema, skin discoloration and redness with some erythema but nontender, no evidence of fluctuation.  Distal neurovascular status intact.    Labs on Admission: I have personally reviewed following labs and imaging studies  CBC: Recent Labs  Lab 02/07/24 1056  WBC 11.5*  NEUTROABS 7.0  HGB 9.7*  HCT 30.7*  MCV 81.0  PLT 29*   Basic Metabolic Panel: Recent Labs  Lab 02/07/24 1056  NA 138  K 3.8  CL 104  CO2 29  GLUCOSE 94  BUN 27*  CREATININE 1.00  CALCIUM  9.1   GFR: Estimated Creatinine Clearance: 55 mL/min (by C-G formula based on SCr of 1 mg/dL). Liver Function  Tests: Recent Labs  Lab 02/07/24 1056  AST 14*  ALT 8  ALKPHOS  66  BILITOT 0.7  PROT 6.8  ALBUMIN  4.1   No results for input(s): LIPASE, AMYLASE in the last 168 hours. No results for input(s): AMMONIA in the last 168 hours. Coagulation Profile: No results for input(s): INR, PROTIME in the last 168 hours. Cardiac Enzymes: No results for input(s): CKTOTAL, CKMB, CKMBINDEX, TROPONINI in the last 168 hours. BNP (last 3 results) No results for input(s): PROBNP in the last 8760 hours. HbA1C: No results for input(s): HGBA1C in the last 72 hours. CBG: No results for input(s): GLUCAP in the last 168 hours. Lipid Profile: No results for input(s): CHOL, HDL, LDLCALC, TRIG, CHOLHDL, LDLDIRECT in the last 72 hours. Thyroid  Function Tests: No results for input(s): TSH, T4TOTAL, FREET4, T3FREE, THYROIDAB in the last 72 hours. Anemia Panel: No results for input(s): VITAMINB12, FOLATE, FERRITIN, TIBC, IRON, RETICCTPCT in the last 72 hours. Urine analysis:    Component Value Date/Time   COLORURINE YELLOW 11/17/2020 1351   APPEARANCEUR CLEAR 11/17/2020 1351   LABSPEC 1.015 11/17/2020 1351   PHURINE 7.5 11/17/2020 1351   GLUCOSEU NEGATIVE 11/17/2020 1351   HGBUR NEGATIVE 11/17/2020 1351   BILIRUBINUR NEGATIVE 11/17/2020 1351   KETONESUR TRACE (A) 11/17/2020 1351   PROTEINUR NEGATIVE 09/29/2017 1627   UROBILINOGEN 0.2 11/17/2020 1351   NITRITE NEGATIVE 11/17/2020 1351   LEUKOCYTESUR NEGATIVE 11/17/2020 1351    Radiological Exams on Admission: No results found.  EKG: Independently reviewed.  Latest EKG/2025 with normal QTc interval.  Assessment/Plan Principal Problem:   Cellulitis and abscess of left leg     1.  Cellulitis and abscess of the left leg in a patient with recent bacteremia on suppressive antibiotic therapy, immunocompromised with pancytopenia due to myelodysplastic syndrome. Patient currently does not have any evidence of complicated abscess or fluctuation. As per cellulitis  order set, blood cultures were drawn.  Will start patient on Ancef  as patient has history of MSSA. Mobilize.  Compression socks.  Monitor for response.  Repeat CBC tomorrow morning.  Hold cefadroxil  while on Ancef .  2.  Chronic pain syndrome: On scheduled oxycodone , gabapentin  and Tylenol .  Continue.  3.  Restless Leg syndrome: Takes carbidopa  levodopa  as needed.  4.  BPH: On multiple medications including Flomax , terazosin  and finasteride .  5.  Myelodysplastic syndrome: As above.  Counts are adequate today and mostly chronic except leukocytosis as compared to baseline.    DVT prophylaxis: SCDs right leg.  Chemical anticoagulation contraindicated due to platelets counts of 29. Code Status: Full code Family Communication: None at the bedside Disposition Plan: Likely home when stable Consults called: None Admission status: Observation and monitoring.   Renato Applebaum MD [facility]

## 2024-02-08 ENCOUNTER — Encounter: Payer: Self-pay | Admitting: Hematology and Oncology

## 2024-02-08 ENCOUNTER — Telehealth (HOSPITAL_COMMUNITY): Payer: Self-pay | Admitting: Pharmacy Technician

## 2024-02-08 ENCOUNTER — Other Ambulatory Visit (HOSPITAL_COMMUNITY): Payer: Self-pay

## 2024-02-08 DIAGNOSIS — Z792 Long term (current) use of antibiotics: Secondary | ICD-10-CM | POA: Diagnosis not present

## 2024-02-08 DIAGNOSIS — Z9841 Cataract extraction status, right eye: Secondary | ICD-10-CM | POA: Diagnosis not present

## 2024-02-08 DIAGNOSIS — D61818 Other pancytopenia: Secondary | ICD-10-CM

## 2024-02-08 DIAGNOSIS — Z803 Family history of malignant neoplasm of breast: Secondary | ICD-10-CM | POA: Diagnosis not present

## 2024-02-08 DIAGNOSIS — Z881 Allergy status to other antibiotic agents status: Secondary | ICD-10-CM | POA: Diagnosis not present

## 2024-02-08 DIAGNOSIS — Z8661 Personal history of infections of the central nervous system: Secondary | ICD-10-CM | POA: Diagnosis not present

## 2024-02-08 DIAGNOSIS — Z85820 Personal history of malignant melanoma of skin: Secondary | ICD-10-CM | POA: Diagnosis not present

## 2024-02-08 DIAGNOSIS — D469 Myelodysplastic syndrome, unspecified: Secondary | ICD-10-CM

## 2024-02-08 DIAGNOSIS — N400 Enlarged prostate without lower urinary tract symptoms: Secondary | ICD-10-CM | POA: Diagnosis not present

## 2024-02-08 DIAGNOSIS — L03116 Cellulitis of left lower limb: Secondary | ICD-10-CM | POA: Diagnosis not present

## 2024-02-08 DIAGNOSIS — Z85828 Personal history of other malignant neoplasm of skin: Secondary | ICD-10-CM | POA: Diagnosis not present

## 2024-02-08 DIAGNOSIS — H409 Unspecified glaucoma: Secondary | ICD-10-CM | POA: Diagnosis not present

## 2024-02-08 DIAGNOSIS — Z87891 Personal history of nicotine dependence: Secondary | ICD-10-CM | POA: Diagnosis not present

## 2024-02-08 DIAGNOSIS — Z9842 Cataract extraction status, left eye: Secondary | ICD-10-CM | POA: Diagnosis not present

## 2024-02-08 DIAGNOSIS — Z8673 Personal history of transient ischemic attack (TIA), and cerebral infarction without residual deficits: Secondary | ICD-10-CM | POA: Diagnosis not present

## 2024-02-08 DIAGNOSIS — L02416 Cutaneous abscess of left lower limb: Secondary | ICD-10-CM | POA: Diagnosis not present

## 2024-02-08 DIAGNOSIS — Z801 Family history of malignant neoplasm of trachea, bronchus and lung: Secondary | ICD-10-CM | POA: Diagnosis not present

## 2024-02-08 DIAGNOSIS — Z79899 Other long term (current) drug therapy: Secondary | ICD-10-CM | POA: Diagnosis not present

## 2024-02-08 DIAGNOSIS — Z96651 Presence of right artificial knee joint: Secondary | ICD-10-CM | POA: Diagnosis not present

## 2024-02-08 DIAGNOSIS — H353210 Exudative age-related macular degeneration, right eye, stage unspecified: Secondary | ICD-10-CM | POA: Diagnosis not present

## 2024-02-08 DIAGNOSIS — G2581 Restless legs syndrome: Secondary | ICD-10-CM | POA: Diagnosis not present

## 2024-02-08 DIAGNOSIS — Z8619 Personal history of other infectious and parasitic diseases: Secondary | ICD-10-CM | POA: Diagnosis not present

## 2024-02-08 DIAGNOSIS — L0390 Cellulitis, unspecified: Secondary | ICD-10-CM | POA: Diagnosis present

## 2024-02-08 DIAGNOSIS — G894 Chronic pain syndrome: Secondary | ICD-10-CM | POA: Diagnosis not present

## 2024-02-08 LAB — CBC WITH DIFFERENTIAL/PLATELET
Abs Immature Granulocytes: 0.05 K/uL (ref 0.00–0.07)
Basophils Absolute: 0 K/uL (ref 0.0–0.1)
Basophils Relative: 0 %
Eosinophils Absolute: 0 K/uL (ref 0.0–0.5)
Eosinophils Relative: 0 %
HCT: 31.2 % — ABNORMAL LOW (ref 39.0–52.0)
Hemoglobin: 9.4 g/dL — ABNORMAL LOW (ref 13.0–17.0)
Immature Granulocytes: 1 %
Lymphocytes Relative: 28 %
Lymphs Abs: 1.6 K/uL (ref 0.7–4.0)
MCH: 25.1 pg — ABNORMAL LOW (ref 26.0–34.0)
MCHC: 30.1 g/dL (ref 30.0–36.0)
MCV: 83.2 fL (ref 80.0–100.0)
Monocytes Absolute: 1.4 K/uL — ABNORMAL HIGH (ref 0.1–1.0)
Monocytes Relative: 25 %
Neutro Abs: 2.6 K/uL (ref 1.7–7.7)
Neutrophils Relative %: 46 %
Platelets: 29 K/uL — CL (ref 150–400)
RBC: 3.75 MIL/uL — ABNORMAL LOW (ref 4.22–5.81)
RDW: 16.1 % — ABNORMAL HIGH (ref 11.5–15.5)
Smear Review: NORMAL
WBC: 5.6 K/uL (ref 4.0–10.5)
nRBC: 0 % (ref 0.0–0.2)

## 2024-02-08 MED ORDER — OXYCODONE HCL 5 MG PO TABS
10.0000 mg | ORAL_TABLET | Freq: Every day | ORAL | Status: DC | PRN
  Administered 2024-02-08 – 2024-02-09 (×4): 10 mg via ORAL
  Filled 2024-02-08 (×4): qty 2

## 2024-02-08 MED ORDER — ENSURE PLUS HIGH PROTEIN PO LIQD
237.0000 mL | Freq: Two times a day (BID) | ORAL | Status: DC
  Administered 2024-02-08 – 2024-02-09 (×2): 237 mL via ORAL

## 2024-02-08 NOTE — Plan of Care (Signed)

## 2024-02-08 NOTE — Telephone Encounter (Signed)
 Patient Product/process development scientist completed.    The patient is insured through [employer]/ [employer]. Patient has Medicare and is not eligible for a copay card, but may be able to apply for patient assistance or Medicare RX Payment Plan (Patient Must reach out to their plan, if eligible for payment plan), if available.    Ran test claim for linezolid  600 mg and the current 5 day co-pay is $36.93.   This test claim was processed through [facility]- copay amounts may vary at other pharmacies due to pharmacy/plan contracts, or as the patient moves through the different stages of their insurance plan.     Reyes Sharps, CPHT Pharmacy Technician III Certified Patient Advocate [facility] Patient Advocate Team Direct Number: [phone]  Fax: [phone]

## 2024-02-08 NOTE — Progress Notes (Signed)
 PROGRESS NOTE    Justin [name].  FMW:[id number] DOB: February 27, 1941 DOA: 02/07/2024 PCP: Garald Karlynn GAILS, MD   Brief Narrative:  Justin ORN Randine Raddle. is a 83 y.o. male with medical history significant of myelodysplastic syndrome currently on symptomatic management and getting Retacrit , thrombocytopenia with platelet ranged from 30-50.  Recent history of left elbow infection with removal of hardware, MSSA bacteremia and epidural abscess treated with Ancef  through PICC line and currently taking cefadroxil  presented to cancer center today with 1 day of worsening swelling/erythema of the left lower extremity below the knee.  Assessment & Plan:   Principal Problem:   Cellulitis and abscess of left leg  Left lower extremity cellulitis, POA - Complicated history of prolonged infection with prolonged antibiotic course as above - ID complicated, appreciate insight recommendations - Continue cefazolin , pending improvement we will discharge on linezolid  with transition back to prior cefadroxil  course   Chronic pain syndrome:  Continue home oxycodone , gabapentin , acetaminophen    Restless Leg syndrome: Takes carbidopa  levodopa  as needed nightly.   BPH: Continue Flomax , terazosin  and finasteride .   Myelodysplastic syndrome Thrombocytopenia/normocytic anemia(chronic) At baseline other than leukocytosis  DVT prophylaxis: SCDs Start: 02/07/24 1641 Code Status:   Code Status: Full Code Family Communication: None present  Status is: Observation  Dispo: The patient is from: Home              Anticipated d/c is to: Home              Anticipated d/c date is: 24 to 48 hours              Patient currently not medically stable for discharge  Consultants:  Infectious disease  Procedures:  None  Antimicrobials:  Cefazolin   Subjective: No acute issues or events overnight leg pain improving but not resolved  Objective: Vitals:   02/07/24 2124 02/07/24 2335 02/08/24 0132 02/08/24  0424  BP: 131/72 127/65  (!) 146/77  Pulse:  70  62  Resp:  18  18  Temp:  98.1 F (36.7 C)  97.8 F (36.6 C)  TempSrc:  Oral  Oral  SpO2:  97%  99%  Weight:   72.5 kg   Height:   5' 9 (1.753 m)     Intake/Output Summary (Last 24 hours) at 02/08/2024 9287 Last data filed at 02/08/2024 0206 Gross per 24 hour  Intake 440 ml  Output --  Net 440 ml   Filed Weights   02/07/24 1556 02/08/24 0132  Weight: 72.5 kg 72.5 kg    Examination:  General:  Pleasantly resting in bed, No acute distress. HEENT:  Normocephalic atraumatic.  Sclerae nonicteric, noninjected.  Extraocular movements intact bilaterally. Neck:  Without mass or deformity.  Trachea is midline. Lungs:  Clear to auscultate bilaterally without rhonchi, wheeze, or rales. Heart:  Regular rate and rhythm.  Without murmurs, rubs, or gallops. Abdomen:  Soft, nontender, nondistended.  Without guarding or rebound. Extremities: Left lower extremity blanching erythema below the knee circumferentially without notable edema  Data Reviewed: I have personally reviewed following labs and imaging studies  CBC: Recent Labs  Lab 02/07/24 1056  WBC 11.5*  NEUTROABS 7.0  HGB 9.7*  HCT 30.7*  MCV 81.0  PLT 29*   Basic Metabolic Panel: Recent Labs  Lab 02/07/24 1056  NA 138  K 3.8  CL 104  CO2 29  GLUCOSE 94  BUN 27*  CREATININE 1.00  CALCIUM  9.1   GFR: Estimated Creatinine Clearance: 55 mL/min (by C-G  formula based on SCr of 1 mg/dL). Liver Function Tests: Recent Labs  Lab 02/07/24 1056  AST 14*  ALT 8  ALKPHOS 66  BILITOT 0.7  PROT 6.8  ALBUMIN  4.1   Recent Results (from the past 240 hours)  Culture, blood (Routine X 2) w Reflex to ID Panel     Status: None (Preliminary result)   Collection Time: 02/07/24  2:32 PM   Specimen: BLOOD RIGHT ARM  Result Value Ref Range Status   Specimen Description BLOOD RIGHT ARM  Final   Special Requests   Final    BOTTLES DRAWN AEROBIC AND ANAEROBIC Blood Culture results  may not be optimal due to an inadequate volume of blood received in culture bottles Performed at [facility], [address] [place], [place] [address]    Culture PENDING  Incomplete   Report Status PENDING  Incomplete  Culture, blood (Routine X 2) w Reflex to ID Panel     Status: None (Preliminary result)   Collection Time: 02/07/24  2:33 PM   Specimen: BLOOD RIGHT HAND  Result Value Ref Range Status   Specimen Description BLOOD RIGHT HAND  Final   Special Requests   Final    BOTTLES DRAWN AEROBIC AND ANAEROBIC Blood Culture results may not be optimal due to an inadequate volume of blood received in culture bottles Performed at [facility], [address], [place], [place] [address]    Culture PENDING  Incomplete   Report Status PENDING  Incomplete    Radiology Studies: No results found.  Scheduled Meds:  amLODipine   5 mg Oral Daily   cholecalciferol   3,000 Units Oral Daily   feeding supplement  237 mL Oral BID BM   finasteride   5 mg Oral Daily   gabapentin   300 mg Oral TID   oxyCODONE   10 mg Oral 5 X Daily   pravastatin   20 mg Oral Daily   rOPINIRole   1 mg Oral TID   tamsulosin   0.4 mg Oral q AM   terazosin   2 mg Oral QHS   Continuous Infusions:   ceFAZolin  (ANCEF ) IV 2 g (02/08/24 0136)     LOS: 0 days   Time spent:  Elsie JAYSON Montclair, DO [facility]  If 7PM-7AM, please contact night-coverage [email or web address]  02/08/2024, 7:12 AM

## 2024-02-08 NOTE — Progress Notes (Signed)
   02/08/24 1622  TOC Brief Assessment  Insurance and Status Reviewed ([employer] / [employer] PPO)  Patient has primary care physician Yes (Plotnikov, Karlynn GAILS, MD)  Home environment has been reviewed Single family home with spouse  Prior level of function: Independent  Prior/Current Home Services No current home services  Social Drivers of Health Review SDOH reviewed no interventions necessary  Readmission risk has been reviewed Yes  Transition of care needs no transition of care needs at this time   Pt screened. Pt states he uses rollator at baseline. Denies any other DME or HH needs. No SDOH risks identified. There are no CM needs at this time. Please place a consult if needs shall arise.

## 2024-02-08 NOTE — Progress Notes (Signed)
 Date and time results received: 02/08/24 0747  Test: Platelets  Critical Value: 29  Name of Provider Notified: MD [facility] notified @ [phone]  MD response @[phone] -  No New Orders

## 2024-02-08 NOTE — Consult Note (Signed)
 NAME: Justin [name].  DOB: 1940-09-21  MRN: [id number]  Date/Time: 02/08/2024 4:10 PM  REQUESTING PROVIDER Dr. Lue Subjective:  REASON FOR CONSULT: Left leg cellulitis ? Justin [name]. is a 83 y.o. male with a history of right TKA myelodysplastic syndrome, with pancytopenia and on Retacrit , recent history of MSSA bacteremia secondary to left elbow septic arthritis from hardware in April 2025, history of lumbar epidural abscess and 6 weeks of IV cefazolin  until 12/18/2023 followed by p.o. cefadroxil  which she is currently on because of her remaining screw in the left elbow he now presented from the cancer center on 02/07/2024 because of pain in his left leg with erythema and swelling and a white count which was elevated to 11.5 from his baseline of 2.6. Vitals in the ED hospital BP of 146/72, temperature 97.9,  pulse 87 and respiratory rate 18 WBC was 11.5, Hb 9.7 and platelet 29 Blood cultures were sent He was started on cefazolin .  I am asked to see him for the left leg cellulitis Patient states he has some discomfort in the leg and the leg is more slow and swollen than usual.  He he lives at home and he takes care of his wife.  Since March when he had the Staph aureus infection in his elbow and blood he has been debilitated.  But he still on his feet a lot.  He has no pets No known injuries He does not have any fever or chills currently He is stable   Past Medical History:  Diagnosis Date   Alcoholism (HCC)    Dry 13 years   Basal cell carcinoma    Right Chest   BPH (benign prostatic hyperplasia)    Diverticulosis of colon    Dizzy spells    none recent   DJD (degenerative joint disease)    lower back   Elbow fracture, left 2009   Dr Celena   FRACTURE, [place], LEFT 12/23/2007   Qualifier: Diagnosis of   By: Georgian ROSALEA CHARM Lamar        GERD (gastroesophageal reflux disease)    Glaucoma    History of TIAs 1 yrs ago   Hyperkeratosis    LBP (low back pain)    Macular  degeneration    left wet right dry sees dr elner   Melanoma [facility]) 2009   x3 Dr Amy [place]   Osteoarthritis    Restless leg syndrome    Thrombocytopenia (HCC) 11/18/2021   Tinnitus    Tobacco abuse    Uses walker    Wears glasses    reading   Wears partial dentures    upper    Past Surgical History:  Procedure Laterality Date   CATARACT EXTRACTION Bilateral 2018   HARDWARE REMOVAL Left 07/09/2020   Procedure: Left elbow hardware removal;  Surgeon: Sharl Selinda Dover, MD;  Location: [facility];  Service: Orthopedics;  Laterality: Left;  60 mins   HARDWARE REMOVAL Left 10/23/2023   Procedure: REMOVAL, HARDWARE;  Surgeon: Celena Sharper, MD;  Location: [facility] OR;  Service: Orthopedics;  Laterality: Left;   IRRIGATION AND DEBRIDEMENT ELBOW Left 10/23/2023   Procedure: IRRIGATION AND DEBRIDEMENT ELBOW;  Surgeon: Celena Sharper, MD;  Location: [facility] OR;  Service: Orthopedics;  Laterality: Left;   mda     Dr. elner wet injection   MELANOMA EXCISION  2009   x3   RECONSTRUCTION MEDIAL COLLATERAL LIGAMENT ELBOW W/ TENDON GRAFT  2009   Left, Dr Celena  TONSILLECTOMY  as child   TOTAL KNEE ARTHROPLASTY Right 11/09/2020   Procedure: TOTAL KNEE ARTHROPLASTY;  Surgeon: Ernie Cough, MD;  Location: [facility] ORS;  Service: Orthopedics;  Laterality: Right;   TRANSESOPHAGEAL ECHOCARDIOGRAM (CATH LAB) N/A 10/24/2023   Procedure: TRANSESOPHAGEAL ECHOCARDIOGRAM;  Surgeon: Jeffrie Oneil BROCKS, MD;  Location: [facility] INVASIVE CV LAB;  Service: Cardiovascular;  Laterality: N/A;    Social History   Socioeconomic History   Marital status: Married    Spouse name: Rollene Caldron   Number of children: 2   Years of education: Not on file   Highest education level: Not on file  Occupational History   Occupation: [place] Veteran [employer]    Employer: RETIRED   Occupation: Scientist, research (physical sciences)   Occupation: Games developer  Tobacco Use   Smoking status: Former    Current packs/day: 0.00    Average  packs/day: 3.0 packs/day for 38.0 years (114.0 ttl pk-yrs)    Types: Cigarettes    Start date: 10    Quit date: 1986    Years since quitting: 39.6   Smokeless tobacco: Never   Tobacco comments:    states he quit May 15 1985  Vaping Use   Vaping status: Never Used  Substance and Sexual Activity   Alcohol  use: No    Comment: PREVIOUS - DRY 63yrs   Drug use: Not Currently   Sexual activity: Not Currently  Other Topics Concern   Not on file  Social History Narrative   Right handed   One story home   Drinks coffee q am      Son and wife lives with him-2025   Social Drivers of Health   Financial Resource Strain: Low Risk  (07/20/2023)   Overall Financial Resource Strain (CARDIA)    Difficulty of Paying Living Expenses: Not very hard  Food Insecurity: No Food Insecurity (02/07/2024)   Hunger Vital Sign    Worried About Running Out of Food in the Last Year: Never true    Ran Out of Food in the Last Year: Never true  Transportation Needs: No Transportation Needs (02/07/2024)   PRAPARE - Administrator, Civil Service (Medical): No    Lack of Transportation (Non-Medical): No  Physical Activity: Inactive (07/20/2023)   Exercise Vital Sign    Days of Exercise per Week: 0 days    Minutes of Exercise per Session: 0 min  Stress: No Stress Concern Present (07/20/2023)   [employer] of Occupational Health - Occupational Stress Questionnaire    Feeling of Stress : Not at all  Social Connections: Moderately Isolated (02/07/2024)   Social Connection and Isolation Panel    Frequency of Communication with Friends and Family: More than three times a week    Frequency of Social Gatherings with Friends and Family: More than three times a week    Attends Religious Services: Never    Database administrator or Organizations: No    Attends Banker Meetings: Never    Marital Status: Married  Catering manager Violence: Not At Risk (02/07/2024)   Humiliation, Afraid, Rape,  and Kick questionnaire    Fear of Current or Ex-Partner: No    Emotionally Abused: No    Physically Abused: No    Sexually Abused: No    Family History  Problem Relation Age of Onset   Cancer Mother        ?breast   Cancer Father        Lung   Allergies  Allergen Reactions   Levofloxacin Other (See Comments)    REACTION: hands pealed   I? Current Facility-Administered Medications  Medication Dose Route Frequency Provider Last Rate Last Admin   acetaminophen  (TYLENOL ) tablet 1,000 mg  1,000 mg Oral Q6H PRN Raenelle Coria, MD       amLODipine  (NORVASC ) tablet 5 mg  5 mg Oral Daily Raenelle Coria, MD   5 mg at 02/07/24 2122   calcium  carbonate (TUMS - dosed in mg elemental calcium ) chewable tablet 600 mg of elemental calcium   1,500 mg Oral Daily PRN Raenelle Coria, MD       carbidopa -levodopa  (SINEMET  IR) 25-100 MG per tablet immediate release 1 tablet  1 tablet Oral QHS PRN Ghimire, Kuber, MD   1 tablet at 02/07/24 2131   ceFAZolin  (ANCEF ) IVPB 2g/100 mL premix  2 g Intravenous Q8H Raenelle Coria, MD 200 mL/hr at 02/08/24 1004 2 g at 02/08/24 1004   cholecalciferol  (VITAMIN D3) tablet 3,000 Units  3,000 Units Oral Daily Raenelle Coria, MD   3,000 Units at 02/08/24 9041   feeding supplement (ENSURE PLUS HIGH PROTEIN) liquid 237 mL  237 mL Oral BID BM Raenelle Coria, MD   237 mL at 02/08/24 9041   finasteride  (PROSCAR ) tablet 5 mg  5 mg Oral Daily Raenelle Coria, MD   5 mg at 02/08/24 9041   gabapentin  (NEURONTIN ) capsule 300 mg  300 mg Oral TID Raenelle Coria, MD   300 mg at 02/08/24 1548   ondansetron  (ZOFRAN ) tablet 4 mg  4 mg Oral Q6H PRN Raenelle Coria, MD       oxyCODONE  (Oxy IR/ROXICODONE ) immediate release tablet 10 mg  10 mg Oral 5 X Daily PRN Lue Elsie BROCKS, MD   10 mg at 02/08/24 1223   pravastatin  (PRAVACHOL ) tablet 20 mg  20 mg Oral Daily Raenelle Coria, MD   20 mg at 02/08/24 9041   rOPINIRole  (REQUIP ) tablet 1 mg  1 mg Oral TID Raenelle Coria, MD   1 mg at 02/08/24  1548   tamsulosin  (FLOMAX ) capsule 0.4 mg  0.4 mg Oral q AM Raenelle Coria, MD   0.4 mg at 02/08/24 9041   terazosin  (HYTRIN ) capsule 2 mg  2 mg Oral QHS Ghimire, Kuber, MD   2 mg at 02/07/24 2124     Abtx:  Anti-infectives (From admission, onward)    Start     Dose/Rate Route Frequency Ordered Stop   02/07/24 1730  ceFAZolin  (ANCEF ) IVPB 2g/100 mL premix        2 g 200 mL/hr over 30 Minutes Intravenous Every 8 hours 02/07/24 1642 02/14/24 1759       REVIEW OF SYSTEMS:  Const: negative fever, negative chills, negative weight loss Eyes: negative diplopia or visual changes, negative eye pain ENT: negative coryza, negative sore throat Resp: negative cough, hemoptysis, dyspnea Cards: negative for chest pain, palpitations, lower extremity edema GU: negative for frequency, dysuria and hematuria GI: Negative for abdominal pain, diarrhea, bleeding, constipation Skin: negative for rash and pruritus Heme: negative for easy bruising and gum/nose bleeding MS: Joint  pain, stiffness has  osteoarthritis Neurolo:negative for headaches, dizziness, vertigo, memory problems  Psych: negative for feelings of anxiety, depression  Endocrine: negative for thyroid , diabetes Allergy/Immunology-levofloxacin Objective:  VITALS:  BP (!) 150/77 (BP Location: Right Arm)   Pulse (!) 59   Temp 97.9 F (36.6 C) (Oral)   Resp 16   Ht 5' 9 (1.753 m)   Wt 72.5 kg   SpO2 90%   BMI  23.60 kg/m   PHYSICAL EXAM:  General: Alert, cooperative, no distress, appears stated age.  Pale Head: Normocephalic, without obvious abnormality, atraumatic. Eyes: Conjunctivae clear, anicteric sclerae. Pupils are equal ENT Nares normal. No drainage or sinus tenderness. Lips, mucosa, and tongue normal. No Thrush Neck: Supple, symmetrical, no adenopathy, thyroid : non tender no carotid bruit and no JVD. Back: No CVA tenderness. Lungs: Clear to auscultation bilaterally. No Wheezing or Rhonchi. No rales. Heart: Regular rate  and rhythm, no murmur, rub or gallop. Abdomen: Soft, non-tender,not distended. Bowel sounds normal. No masses Extremities: Left elbow there is a soft tissue swelling and the joint is deformed  Skin: Left leg is more swollen than the right and there is heme staining with warmth.  Very mild sensitivity to touch 02/08/24    02/07/24   Lymph: Cervical, supraclavicular normal. Neurologic: Grossly non-focal Leg wrapped with Ace bandage  Pertinent Labs Lab Results CBC    Component Value Date/Time   WBC 5.6 02/08/2024 0450   RBC 3.75 (L) 02/08/2024 0450   HGB 9.4 (L) 02/08/2024 0450   HGB 9.7 (L) 02/07/2024 1056   HCT 31.2 (L) 02/08/2024 0450   PLT 29 (LL) 02/08/2024 0450   PLT 29 (L) 02/07/2024 1056   MCV 83.2 02/08/2024 0450   MCH 25.1 (L) 02/08/2024 0450   MCHC 30.1 02/08/2024 0450   RDW 16.1 (H) 02/08/2024 0450   LYMPHSABS 1.6 02/08/2024 0450   MONOABS 1.4 (H) 02/08/2024 0450   EOSABS 0.0 02/08/2024 0450   BASOSABS 0.0 02/08/2024 0450       Latest Ref Rng & Units 02/07/2024   10:56 AM 01/24/2024    9:39 AM 01/10/2024   11:09 AM  CMP  Glucose 70 - 99 mg/dL 94  892  91   BUN 8 - 23 mg/dL 27  23  17    Creatinine 0.61 - 1.24 mg/dL 8.99  8.81  8.96   Sodium 135 - 145 mmol/L 138  139  140   Potassium 3.5 - 5.1 mmol/L 3.8  4.3  4.5   Chloride 98 - 111 mmol/L 104  108  108   CO2 22 - 32 mmol/L 29  26  28    Calcium  8.9 - 10.3 mg/dL 9.1  8.9  9.1   Total Protein 6.5 - 8.1 g/dL 6.8  7.0  6.9   Total Bilirubin 0.0 - 1.2 mg/dL 0.7  0.7  0.5   Alkaline Phos 38 - 126 U/L 66  60  66   AST 15 - 41 U/L 14  16  14    ALT 0 - 44 U/L 8  <5  5       Microbiology: Recent Results (from the past 240 hours)  Culture, blood (Routine X 2) w Reflex to ID Panel     Status: None (Preliminary result)   Collection Time: 02/07/24  2:32 PM   Specimen: BLOOD RIGHT ARM  Result Value Ref Range Status   Specimen Description BLOOD RIGHT ARM  Final   Special Requests   Final    BOTTLES DRAWN AEROBIC  AND ANAEROBIC Blood Culture results may not be optimal due to an inadequate volume of blood received in culture bottles   Culture   Final    NO GROWTH < 24 HOURS Performed at [facility], [address] [place], [place] [address]    Report Status PENDING  Incomplete  Culture, blood (Routine X 2) w Reflex to ID Panel     Status: None (Preliminary result)  Collection Time: 02/07/24  2:33 PM   Specimen: BLOOD RIGHT HAND  Result Value Ref Range Status   Specimen Description BLOOD RIGHT HAND  Final   Special Requests   Final    BOTTLES DRAWN AEROBIC AND ANAEROBIC Blood Culture results may not be optimal due to an inadequate volume of blood received in culture bottles   Culture   Final    NO GROWTH < 24 HOURS Performed at [facility], [address] [place], [place] [address]    Report Status PENDING  Incomplete      IMAGING RESULTS: No imaging to review I have personally reviewed the films ? Impression/Recommendation Left leg cellulitis with severe heme staining This is on cefadroxil  It is better than yesterday on cefazolin  The patient has to elevate the the leg and needs compression wrap for stocking When I wrapped the Ace \\he  felt very comfortable and less pain Continue cefazolin  On Discharge he can go on linezolid  600mg  BID for just 4 days and after completion of linezolid  he can restart cefadroxil  twice daily.  He will follow-up with Dr. Dea as scheduled in September 12,.  Recent Staph aureus bacteremia, Staph aureus left elbow septic arthritis secondary to hardware, lumbar epidural abscess treated with IV antibiotics followed by suppressive cefadroxil   Myelodysplastic syndrome followed by heme-onc  Pancytopenia Platelets around 25-30 on Retacrit .   Discussed with patient in great detail that he has to keep the leg elevated and to use compression wear  This consult/evaluation involved complex antimicrobial management.  I have personally spent 75  minutes ---minutes involved in face-to-face and non-face-to-face activities for this patient on the day of the visit. Professional time spent includes the following activities: Preparing to see the patient (review of tests), Obtaining and/or reviewing separately obtained history (admission/discharge record), Performing a medically appropriate examination and/or evaluation , Ordering medications/tests/procedures, referring and communicating with other health care professionals, Documenting clinical information in the EMR, Independently interpreting results (not separately reported), Communicating results to the patient, Counseling and educating the patient and Care coordination (not separately reported) with the hospitalist and pharmacist and his nurses.    ________________________________________________ ID will sign off.  Call if needed. Note:  This document was prepared using Dragon voice recognition software and may include unintentional dictation errors.

## 2024-02-09 DIAGNOSIS — L03116 Cellulitis of left lower limb: Secondary | ICD-10-CM | POA: Diagnosis not present

## 2024-02-09 LAB — CBC WITH DIFFERENTIAL/PLATELET
Abs Granulocyte: 2.2 K/uL (ref 1.5–6.5)
Abs Immature Granulocytes: 0.05 K/uL (ref 0.00–0.07)
Basophils Absolute: 0 K/uL (ref 0.0–0.1)
Basophils Relative: 0 %
Eosinophils Absolute: 0 K/uL (ref 0.0–0.5)
Eosinophils Relative: 0 %
HCT: 30.9 % — ABNORMAL LOW (ref 39.0–52.0)
Hemoglobin: 9.2 g/dL — ABNORMAL LOW (ref 13.0–17.0)
Immature Granulocytes: 1 %
Lymphocytes Relative: 18 %
Lymphs Abs: 0.9 K/uL (ref 0.7–4.0)
MCH: 24.9 pg — ABNORMAL LOW (ref 26.0–34.0)
MCHC: 29.8 g/dL — ABNORMAL LOW (ref 30.0–36.0)
MCV: 83.7 fL (ref 80.0–100.0)
Monocytes Absolute: 1.7 K/uL — ABNORMAL HIGH (ref 0.1–1.0)
Monocytes Relative: 35 %
Neutro Abs: 2.2 K/uL (ref 1.7–7.7)
Neutrophils Relative %: 46 %
Platelets: 31 K/uL — ABNORMAL LOW (ref 150–400)
RBC: 3.69 MIL/uL — ABNORMAL LOW (ref 4.22–5.81)
RDW: 16.1 % — ABNORMAL HIGH (ref 11.5–15.5)
WBC: 4.9 K/uL (ref 4.0–10.5)
nRBC: 0 % (ref 0.0–0.2)

## 2024-02-09 MED ORDER — CEFADROXIL 500 MG PO CAPS: 1000.0000 mg | ORAL_CAPSULE | Freq: Two times a day (BID) | ORAL | 0 refills | Status: DC

## 2024-02-09 MED ORDER — LINEZOLID 600 MG PO TABS: 600.0000 mg | ORAL_TABLET | Freq: Two times a day (BID) | ORAL | 0 refills | Status: AC

## 2024-02-09 NOTE — Discharge Summary (Signed)
 Physician Discharge Summary  Justin [name]. FMW:[id number] DOB: 12/13/1940 DOA: 02/07/2024  PCP: Justin Karlynn GAILS, MD  Admit date: 02/07/2024 Discharge date: 02/09/2024  Admitted From: Home Disposition:  Home  Recommendations for Outpatient Follow-up:  Follow up with PCP in 1-2 weeks Follow up with ID as scheduled:  Home Health: None Equipment/Devices: None  Discharge Condition: Stable CODE STATUS: Full Diet recommendation: Low-salt low-fat diet  Brief/Interim Summary: Justin [name]. is a 83 y.o. male with medical history significant of myelodysplastic syndrome currently on symptomatic management and getting Retacrit , thrombocytopenia with platelet ranged from 30-50.  Recent history of left elbow infection with removal of hardware, MSSA bacteremia and epidural abscess treated with Ancef  through PICC line and currently taking cefadroxil  presented to cancer center today with 1 day of worsening swelling/erythema of the left lower extremity below the knee.  Patient admitted as above with acute left lower extremity cellulitis, infectious disease consulted recommending further antibiotics with linezolid  over the next 4 days then transition back to patient's prior home cefadroxil  course.  Outpatient follow-up with infectious disease as previously scheduled    Discharge Diagnoses:  Principal Problem:   Cellulitis of left leg Active Problems:   Cellulitis and abscess of left leg  Left lower extremity cellulitis, POA - ID complicated, appreciate insight recommendations - Continue linezolid  before transitioning back to home cefadroxil  course   Chronic pain syndrome:  Continue home oxycodone , gabapentin , acetaminophen    Restless Leg syndrome: Takes carbidopa  levodopa  as needed nightly.   BPH: Continue Flomax , terazosin  and finasteride .   Myelodysplastic syndrome Thrombocytopenia/normocytic anemia(chronic) At baseline other than leukocytosis  Discharge  Instructions   Allergies as of 02/09/2024       Reactions   Levofloxacin Other (See Comments)   REACTION: hands pealed        Medication List     STOP taking these medications    ceFAZolin  1 g injection Commonly known as: ANCEF    clotrimazole -betamethasone  cream Commonly known as: LOTRISONE        TAKE these medications    acetaminophen  500 MG tablet Commonly known as: TYLENOL  Take 1,000 mg by mouth every 6 (six) hours as needed for mild pain (pain score 1-3) or moderate pain (pain score 4-6).   amLODipine  5 MG tablet Commonly known as: NORVASC  TAKE 1 TABLET BY MOUTH DAILY What changed: when to take this   calcium  carbonate 750 MG chewable tablet Commonly known as: TUMS EX Chew 750-1,500 mg by mouth 4 (four) times daily as needed for heartburn.   carbidopa -levodopa  25-100 MG tablet Commonly known as: SINEMET  IR For breakthrough restless leg, you can take 1 tablet at bedtime.  OK to take extra tab as needed during the day. What changed:  how much to take how to take this when to take this additional instructions   cefadroxil  500 MG capsule Commonly known as: DURICEF Take 2 capsules (1,000 mg total) by mouth 2 (two) times daily. Start taking on: February 14, 2024 What changed: These instructions start on February 14, 2024. If you are unsure what to do until then, ask your doctor or other care provider.   Cholecalciferol  25 MCG (1000 UT) tablet Take 1,000 Units by mouth in the morning, at noon, and at bedtime.   finasteride  5 MG tablet Commonly known as: PROSCAR  TAKE 1 TABLET (5 MG TOTAL) BY MOUTH DAILY.   furosemide  20 MG tablet Commonly known as: LASIX  Take 20 mg by mouth as needed for edema or fluid.   gabapentin  300 MG  capsule Commonly known as: NEURONTIN  Take 1 capsule (300 mg total) by mouth 3 (three) times daily.   ibuprofen 200 MG tablet Commonly known as: ADVIL Take 400 mg by mouth every 6 (six) hours as needed for mild pain (pain score 1-3) or  moderate pain (pain score 4-6).   linezolid  600 MG tablet Commonly known as: ZYVOX  Take 1 tablet (600 mg total) by mouth 2 (two) times daily for 4 days.   magnesium  hydroxide 400 MG/5ML suspension Commonly known as: MILK OF MAGNESIA Take 15 mLs by mouth daily as needed for mild constipation.   ondansetron  4 MG tablet Commonly known as: ZOFRAN  Take 1 tablet (4 mg total) by mouth every 6 (six) hours as needed for nausea or vomiting.   Oxycodone  HCl 10 MG Tabs Take 1 tablet (10 mg total) by mouth 5 (five) times daily for 5 days.   pravastatin  20 MG tablet Commonly known as: PRAVACHOL  TAKE 1 TABLET (20 MG TOTAL) BY MOUTH DAILY.   PRESERVISION AREDS PO Take 1 tablet by mouth 2 (two) times daily.   rOPINIRole  2 MG tablet Commonly known as: REQUIP  TAKE 1 TABLET BY MOUTH AT AT NOON AND 1 TAB AT 4PM AND 1 TAB AT BEDTIME What changed:  how much to take how to take this when to take this additional instructions   SYSTANE OP Place 1 drop into both eyes 2 (two) times daily as needed (dry eyes). [facility]   tamsulosin  0.4 MG Caps capsule Commonly known as: FLOMAX  TAKE 1 CAPSULE (0.4 MG TOTAL) BY MOUTH IN THE MORNING.   terazosin  2 MG capsule Commonly known as: HYTRIN  TAKE 1 CAPSULE BY MOUTH EVERY NIGHT AT BEDTIME   Vitamin B-12 1000 MCG Subl DISSOLVE 2 TABLETS IN MOUTH EVERY DAY What changed:  how much to take how to take this when to take this additional instructions        Allergies  Allergen Reactions   Levofloxacin Other (See Comments)    REACTION: hands pealed    Consultations: Infectious disease  Procedures/Studies: No results found.   Subjective: No acute issues or events overnight   Discharge Exam: Vitals:   02/08/24 2220 02/09/24 0402  BP: (!) 149/69 (!) 150/72  Pulse:  62  Resp:  18  Temp:  97.9 F (36.6 C)  SpO2:  98%   Vitals:   02/08/24 1430 02/08/24 2031 02/08/24 2220 02/09/24 0402  BP: (!) 150/77 (!) 149/69 (!) 149/69 (!) 150/72   Pulse: (!) 59 (!) 53  62  Resp:  18  18  Temp:  98.1 F (36.7 C)  97.9 F (36.6 C)  TempSrc:  Oral  Oral  SpO2:  97%  98%  Weight:      Height:        General: Pt is alert, awake, not in acute distress Cardiovascular: RRR, S1/S2 +, no rubs, no gallops Respiratory: CTA bilaterally, no wheezing, no rhonchi Abdominal: Soft, NT, ND, bowel sounds + Extremities: no edema, left lower extremity blanching erythema markedly improved    The results of significant diagnostics from this hospitalization (including imaging, microbiology, ancillary and laboratory) are listed below for reference.     Microbiology: Recent Results (from the past 240 hours)  Culture, blood (Routine X 2) w Reflex to ID Panel     Status: None (Preliminary result)   Collection Time: 02/07/24  2:32 PM   Specimen: BLOOD RIGHT ARM  Result Value Ref Range Status   Specimen Description BLOOD RIGHT ARM  Final  Special Requests   Final    BOTTLES DRAWN AEROBIC AND ANAEROBIC Blood Culture results may not be optimal due to an inadequate volume of blood received in culture bottles   Culture   Final    NO GROWTH 2 DAYS Performed at [facility], [address] [place], [place] [address]    Report Status PENDING  Incomplete  Culture, blood (Routine X 2) w Reflex to ID Panel     Status: None (Preliminary result)   Collection Time: 02/07/24  2:33 PM   Specimen: BLOOD RIGHT HAND  Result Value Ref Range Status   Specimen Description BLOOD RIGHT HAND  Final   Special Requests   Final    BOTTLES DRAWN AEROBIC AND ANAEROBIC Blood Culture results may not be optimal due to an inadequate volume of blood received in culture bottles   Culture   Final    NO GROWTH 2 DAYS Performed at [facility], [address], [place], [place] [address]    Report Status PENDING  Incomplete     Labs: BNP (last 3 results) No results for input(s): BNP in the last 8760 hours. Basic Metabolic Panel: Recent Labs  Lab  02/07/24 1056  NA 138  K 3.8  CL 104  CO2 29  GLUCOSE 94  BUN 27*  CREATININE 1.00  CALCIUM  9.1   Liver Function Tests: Recent Labs  Lab 02/07/24 1056  AST 14*  ALT 8  ALKPHOS 66  BILITOT 0.7  PROT 6.8  ALBUMIN  4.1   No results for input(s): LIPASE, AMYLASE in the last 168 hours. No results for input(s): AMMONIA in the last 168 hours. CBC: Recent Labs  Lab 02/07/24 1056 02/08/24 0450 02/09/24 0654  WBC 11.5* 5.6 4.9  NEUTROABS 7.0 2.6 2.2  HGB 9.7* 9.4* 9.2*  HCT 30.7* 31.2* 30.9*  MCV 81.0 83.2 83.7  PLT 29* 29* 31*   Urinalysis    Component Value Date/Time   COLORURINE YELLOW 11/17/2020 1351   APPEARANCEUR CLEAR 11/17/2020 1351   LABSPEC 1.015 11/17/2020 1351   PHURINE 7.5 11/17/2020 1351   GLUCOSEU NEGATIVE 11/17/2020 1351   HGBUR NEGATIVE 11/17/2020 1351   BILIRUBINUR NEGATIVE 11/17/2020 1351   KETONESUR TRACE (A) 11/17/2020 1351   PROTEINUR NEGATIVE 09/29/2017 1627   UROBILINOGEN 0.2 11/17/2020 1351   NITRITE NEGATIVE 11/17/2020 1351   LEUKOCYTESUR NEGATIVE 11/17/2020 1351   Sepsis Labs Recent Labs  Lab 02/07/24 1056 02/08/24 0450 02/09/24 0654  WBC 11.5* 5.6 4.9   Microbiology Recent Results (from the past 240 hours)  Culture, blood (Routine X 2) w Reflex to ID Panel     Status: None (Preliminary result)   Collection Time: 02/07/24  2:32 PM   Specimen: BLOOD RIGHT ARM  Result Value Ref Range Status   Specimen Description BLOOD RIGHT ARM  Final   Special Requests   Final    BOTTLES DRAWN AEROBIC AND ANAEROBIC Blood Culture results may not be optimal due to an inadequate volume of blood received in culture bottles   Culture   Final    NO GROWTH 2 DAYS Performed at [facility], [address] [place], [place] [address]    Report Status PENDING  Incomplete  Culture, blood (Routine X 2) w Reflex to ID Panel     Status: None (Preliminary result)   Collection Time: 02/07/24  2:33 PM   Specimen: BLOOD RIGHT HAND  Result Value  Ref Range Status   Specimen Description BLOOD RIGHT HAND  Final   Special Requests   Final    BOTTLES DRAWN AEROBIC AND ANAEROBIC Blood Culture results may not be optimal due to an inadequate volume of blood received in culture bottles   Culture   Final    NO GROWTH 2 DAYS Performed at [facility], [address] [place], [place] [address]    Report Status PENDING  Incomplete     Time coordinating discharge: Over 30 minutes  SIGNED:   Elsie JAYSON Montclair, DO [facility] 02/09/2024, 10:42 AM Pager   If 7PM-7AM, please contact night-coverage [email or web address]

## 2024-02-09 NOTE — Plan of Care (Signed)

## 2024-02-09 NOTE — Progress Notes (Signed)
 Reviewed discharge paperwork with patient. Went over medication regimen, follow up appointments, and pharmacy information for prescription pickup. Patient stated he had no questions or concerns. Patient discharged via wheelchair by NT to main entrance to wait on his son.

## 2024-02-11 ENCOUNTER — Telehealth: Payer: Self-pay | Admitting: *Deleted

## 2024-02-11 NOTE — Transitions of Care (Post Inpatient/ED Visit) (Signed)
 02/11/2024  Name: Justin [name] [facility]. MRN: [id number] DOB: [birth year]  Today's TOC FU Call Status: Today's TOC FU Call Status:: Successful TOC FU Call Completed TOC FU Call Complete Date: 02/11/24 Patient's Name and Date of Birth confirmed.  Transition Care Management Follow-up Telephone Call Date of Discharge: 02/09/24 Discharge Facility: [facility]) Type of Discharge: Inpatient Admission Primary Inpatient Discharge Diagnosis:: (L) leg cellulitis with abscess How have you been since you were released from the hospital?: Better (I am doing fine; no problems.  I am independent and do everything for myself.  It is not necessary for someone to call and check on me- there is nothing to check on; my son is a retired Museum/gallery exhibitions officer and he lives with me and helps if I need anything) Any questions or concerns?: No  Items Reviewed: Did you receive and understand the discharge instructions provided?: Yes (thoroughly reviewed with patient who verbalizes good understanding of same) Medications obtained,verified, and reconciled?: Yes (Medications Reviewed) (Full medication reconciliation/ review completed; no concerns or discrepancies identified; confirmed patient obtained/ is taking all newly Rx'd medications as instructed; self-manages medications and denies questions/ concerns around medications today) Any new allergies since your discharge?: No Dietary orders reviewed?: Yes Type of Diet Ordered:: We eat as healthy as we can, but no special diet Do you have support at home?: Yes People in Home [RPT]: child(ren), adult, spouse Name of Support/Comfort Primary Source: Reports independent in self-care activities; resides with spouse (he is her caregiver) and supportive local son- son and local daughter assists as/ if needed/ indicated  Medications Reviewed Today: Medications Reviewed Today     Reviewed by Epimenio Schetter M, RN (Registered Nurse) on 02/11/24 at 1241  Med List Status: <None>    Medication Order Taking? Sig Documenting Provider Last Dose Status Informant  acetaminophen  (TYLENOL ) 500 MG tablet [id number] Yes Take 1,000 mg by mouth every 6 (six) hours as needed for mild pain (pain score 1-3) or moderate pain (pain score 4-6). [provider]  Active Self, Pharmacy Records  amLODipine  (NORVASC ) 5 MG tablet [id number] Yes TAKE 1 TABLET BY MOUTH DAILY Plotnikov, Aleksei V, MD  Active Self, Pharmacy Records  calcium  carbonate (TUMS EX) 750 MG chewable tablet [id number] Yes Chew 750-1,500 mg by mouth 4 (four) times daily as needed for heartburn. [provider]  Active Self, Pharmacy Records  carbidopa -levodopa  (SINEMET  IR) 25-100 MG tablet [id number] Yes For breakthrough restless leg, you can take 1 tablet at bedtime.  OK to take extra tab as needed during the day. Patel, Donika K, DO  Active Self, Pharmacy Records           Med Note [facility] [place], [place] A   Thu Feb 07, 2024  5:45 PM)    cefadroxil  (DURICEF) 500 MG capsule [id number] Yes Take 2 capsules (1,000 mg total) by mouth 2 (two) times daily.  Patient taking differently: Take 1,000 mg by mouth 2 (two) times daily. 02/11/24: Confirms without prompting during TOC call, he plans to resume taking as ordered post-hospital discharge on 02/14/24 when linezoid prescription has been completed   Lue Elsie BROCKS, MD  Active   Cholecalciferol  1000 UNITS tablet [id number] Yes Take 1,000 Units by mouth in the morning, at noon, and at bedtime. [provider]  Active Self, Pharmacy Records  Cyanocobalamin  (VITAMIN B-12) 1000 MCG SUBL [id number] Yes DISSOLVE 2 TABLETS IN MOUTH EVERY DAY Plotnikov, Aleksei V, MD  Active Self, Pharmacy Records  finasteride  (PROSCAR ) 5 MG tablet [id number] Yes TAKE 1  TABLET (5 MG TOTAL) BY MOUTH DAILY. Plotnikov, Aleksei V, MD  Active Self, Pharmacy Records  furosemide  (LASIX ) 20 MG tablet [id number] Yes Take 20 mg by mouth as needed for edema or fluid. [provider]   Active Self, Pharmacy Records  gabapentin  (NEURONTIN ) 300 MG capsule [id number] Yes Take 1 capsule (300 mg total) by mouth 3 (three) times daily. Laurence Locus, DO  Active Self, Pharmacy Records  ibuprofen (ADVIL) 200 MG tablet [id number] Yes Take 400 mg by mouth every 6 (six) hours as needed for mild pain (pain score 1-3) or moderate pain (pain score 4-6). [provider]  Active Self, Pharmacy Records  linezolid  (ZYVOX ) 600 MG tablet [id number] Yes Take 1 tablet (600 mg total) by mouth 2 (two) times daily for 4 days. Lue Elsie BROCKS, MD  Active   magnesium  hydroxide (MILK OF MAGNESIA) 400 MG/5ML suspension [id number] Yes Take 15 mLs by mouth daily as needed for mild constipation. [provider]  Active Self, Pharmacy Records  Multiple Vitamins-Minerals (PRESERVISION AREDS PO) [id number] Yes Take 1 tablet by mouth 2 (two) times daily. [provider]  Active Self, Pharmacy Records  ondansetron  (ZOFRAN ) 4 MG tablet [id number]  Take 1 tablet (4 mg total) by mouth every 6 (six) hours as needed for nausea or vomiting.  Patient not taking: Reported on 02/11/2024   Laurence Locus, DO  Active Self, Pharmacy Records  Oxycodone  HCl 10 MG TABS [id number] Yes Take 1 tablet (10 mg total) by mouth 5 (five) times daily for 5 days. Laurence Locus, DO  Active Self, Pharmacy Records           Med Note (CRUTHIS, CHLOE C   Fri Feb 08, 2024  9:50 AM) Pt stated he is taking this medication five times daily everyday   Polyethyl Glycol-Propyl Glycol (SYSTANE OP) [id number] Yes Place 1 drop into both eyes 2 (two) times daily as needed (dry eyes). [facility] [provider]  Active Self, Pharmacy Records  pravastatin  (PRAVACHOL ) 20 MG tablet [id number] Yes TAKE 1 TABLET (20 MG TOTAL) BY MOUTH DAILY. Plotnikov, Aleksei V, MD  Active Self, Pharmacy Records  rOPINIRole  (REQUIP ) 2 MG tablet [id number] Yes TAKE 1 TABLET BY MOUTH AT AT NOON AND 1 TAB AT 4PM AND 1 TAB AT BEDTIME Plotnikov, Aleksei V, MD  Active Self,  Pharmacy Records  tamsulosin  (FLOMAX ) 0.4 MG CAPS capsule [id number] Yes TAKE 1 CAPSULE (0.4 MG TOTAL) BY MOUTH IN THE MORNING. Plotnikov, Aleksei V, MD  Active Self, Pharmacy Records  terazosin  (HYTRIN ) 2 MG capsule [id number] Yes TAKE 1 CAPSULE BY MOUTH EVERY NIGHT AT BEDTIME Plotnikov, Aleksei V, MD  Active Self, Pharmacy Records           Home Care and Equipment/Supplies: Were Home Health Services Ordered?: No Any new equipment or medical supplies ordered?: No  Functional Questionnaire: Do you need assistance with bathing/showering or dressing?: No Do you need assistance with meal preparation?: No Do you need assistance with eating?: No Do you have difficulty maintaining continence: No Do you need assistance with getting out of bed/getting out of a chair/moving?: No Do you have difficulty managing or taking your medications?: No  Follow up appointments reviewed: PCP Follow-up appointment confirmed?: Yes (care coordination outreach in real-time with scheduling care guide to successfully schedule hospital follow up PCP appointment 02/13/24) Date of PCP follow-up appointment?: 02/13/24 Follow-up Provider: PCP- Dr. Garald Specialist [facility] Follow-up appointment confirmed?: Yes Date of Specialist follow-up appointment?: 03/14/24 (verified this is recommended time frame for follow  up per hospital discharging provider notes) Follow-Up Specialty Provider:: ID provider Do you need transportation to your follow-up appointment?: No Do you understand care options if your condition(s) worsen?: Yes-patient verbalized understanding  SDOH Interventions Today    Flowsheet Row Most Recent Value  SDOH Interventions   Food Insecurity Interventions Intervention Not Indicated  Housing Interventions Intervention Not Indicated  Transportation Interventions Intervention Not Indicated  [local son and daughter provide transportation]  Utilities Interventions Intervention Not Indicated   See TOC  assessment tabs for additional assessment/ TOC intervention information  Patient declines need for ongoing/ further care management outreach; declines enrollment in 30-day TOC program; provided my direct contact information should questions/ concerns/ needs arise post-TOC call   Pls call/ message for questions,  Avish Torry Mckinney Johnnette Laux, RN, BSN, Media planner  Transitions of Care  [facility] [phone]: direct office

## 2024-02-12 ENCOUNTER — Encounter: Payer: Self-pay | Admitting: Hematology and Oncology

## 2024-02-12 LAB — CULTURE, BLOOD (ROUTINE X 2)
Culture: NO GROWTH
Culture: NO GROWTH

## 2024-02-13 ENCOUNTER — Ambulatory Visit: Admitting: Internal Medicine

## 2024-02-13 ENCOUNTER — Encounter: Payer: Self-pay | Admitting: Internal Medicine

## 2024-02-13 VITALS — BP 135/79 | HR 71 | Temp 97.7°F | Ht 69.0 in | Wt 163.0 lb

## 2024-02-13 DIAGNOSIS — D469 Myelodysplastic syndrome, unspecified: Secondary | ICD-10-CM | POA: Diagnosis not present

## 2024-02-13 DIAGNOSIS — M7022 Olecranon bursitis, left elbow: Secondary | ICD-10-CM

## 2024-02-13 DIAGNOSIS — E538 Deficiency of other specified B group vitamins: Secondary | ICD-10-CM

## 2024-02-13 DIAGNOSIS — M7032 Other bursitis of elbow, left elbow: Secondary | ICD-10-CM | POA: Insufficient documentation

## 2024-02-13 NOTE — Assessment & Plan Note (Signed)
 On Vit B12

## 2024-02-13 NOTE — Assessment & Plan Note (Signed)
 Chronic MDS  - f/u w/dr Federico L elbow bursitis relapsed - call Dr Celena, r/o infection

## 2024-02-13 NOTE — Assessment & Plan Note (Signed)
 Chronic - f/u w/dr Federico L elbow bursitis relapsed - call Dr Celena

## 2024-02-13 NOTE — Progress Notes (Signed)
 Subjective:  Patient ID: Justin [name]., male    DOB: [birth year]  Age: 83 y.o. MRN: [id number]  CC: Hospitalization Follow-up (Pt states his left leg is still red, swollen and warm to the touched... Pts redness and swelling and went up the leg, left elbow has develop a cyst the size of a golf ball)   HPI [name] presents for LLE cellulitis  Per hx:  Admit date: 02/07/2024 Discharge date: 02/09/2024   Admitted From: Home Disposition:  Home   Recommendations for Outpatient Follow-up:  Follow up with PCP in 1-2 weeks Follow up with ID as scheduled:   Home Health: None Equipment/Devices: None   Discharge Condition: Stable CODE STATUS: Full Diet recommendation: Low-salt low-fat diet   Brief/Interim Summary: Justin [name]. is a 83 y.o. male with medical history significant of myelodysplastic syndrome currently on symptomatic management and getting Retacrit , thrombocytopenia with platelet ranged from 30-50.  Recent history of left elbow infection with removal of hardware, MSSA bacteremia and epidural abscess treated with Ancef  through PICC line and currently taking cefadroxil  presented to cancer center today with 1 day of worsening swelling/erythema of the left lower extremity below the knee.   Patient admitted as above with acute left lower extremity cellulitis, infectious disease consulted recommending further antibiotics with linezolid  over the next 4 days then transition back to patient's prior home cefadroxil  course.  Outpatient follow-up with infectious disease as previously scheduled      Discharge Diagnoses:  Principal Problem:   Cellulitis of left leg Active Problems:   Cellulitis and abscess of left leg   Left lower extremity cellulitis, POA - ID complicated, appreciate insight recommendations - Continue linezolid  before transitioning back to home cefadroxil  course   Chronic pain syndrome:  Continue home oxycodone , gabapentin ,  acetaminophen    Restless Leg syndrome: Takes carbidopa  levodopa  as needed nightly.   BPH: Continue Flomax , terazosin  and finasteride .   Myelodysplastic syndrome Thrombocytopenia/normocytic anemia(chronic) At baseline other than leukocytosis   Outpatient Medications Prior to Visit  Medication Sig Dispense Refill   acetaminophen  (TYLENOL ) 500 MG tablet Take 1,000 mg by mouth every 6 (six) hours as needed for mild pain (pain score 1-3) or moderate pain (pain score 4-6).     amLODipine  (NORVASC ) 5 MG tablet TAKE 1 TABLET BY MOUTH DAILY 90 tablet 3   calcium  carbonate (TUMS EX) 750 MG chewable tablet Chew 750-1,500 mg by mouth 4 (four) times daily as needed for heartburn.     carbidopa -levodopa  (SINEMET  IR) 25-100 MG tablet For breakthrough restless leg, you can take 1 tablet at bedtime.  OK to take extra tab as needed during the day. 100 tablet 3   cefadroxil  (DURICEF) 500 MG capsule Take 2 capsules (1,000 mg total) by mouth 2 (two) times daily. (Patient taking differently: Take 1,000 mg by mouth 2 (two) times daily. 02/11/24: Confirms without prompting during TOC call, he plans to resume taking as ordered post-hospital discharge on 02/14/24 when linezoid prescription has been completed) 120 capsule 0   Cholecalciferol  1000 UNITS tablet Take 1,000 Units by mouth in the morning, at noon, and at bedtime.     Cyanocobalamin  (VITAMIN B-12) 1000 MCG SUBL DISSOLVE 2 TABLETS IN MOUTH EVERY DAY 180 tablet 1   finasteride  (PROSCAR ) 5 MG tablet TAKE 1 TABLET (5 MG TOTAL) BY MOUTH DAILY. 90 tablet 1   furosemide  (LASIX ) 20 MG tablet Take 20 mg by mouth as needed for edema or fluid.     gabapentin  (NEURONTIN ) 300  MG capsule Take 1 capsule (300 mg total) by mouth 3 (three) times daily.     ibuprofen (ADVIL) 200 MG tablet Take 400 mg by mouth every 6 (six) hours as needed for mild pain (pain score 1-3) or moderate pain (pain score 4-6).     linezolid  (ZYVOX ) 600 MG tablet Take 1 tablet (600 mg total) by mouth 2  (two) times daily for 4 days. 8 tablet 0   magnesium  hydroxide (MILK OF MAGNESIA) 400 MG/5ML suspension Take 15 mLs by mouth daily as needed for mild constipation.     Multiple Vitamins-Minerals (PRESERVISION AREDS PO) Take 1 tablet by mouth 2 (two) times daily.     Oxycodone  HCl 10 MG TABS Take 1 tablet (10 mg total) by mouth 5 (five) times daily for 5 days. 25 tablet 0   Polyethyl Glycol-Propyl Glycol (SYSTANE OP) Place 1 drop into both eyes 2 (two) times daily as needed (dry eyes). [facility]     pravastatin  (PRAVACHOL ) 20 MG tablet TAKE 1 TABLET (20 MG TOTAL) BY MOUTH DAILY. 90 tablet 1   rOPINIRole  (REQUIP ) 2 MG tablet TAKE 1 TABLET BY MOUTH AT AT NOON AND 1 TAB AT 4PM AND 1 TAB AT BEDTIME 270 tablet 1   tamsulosin  (FLOMAX ) 0.4 MG CAPS capsule TAKE 1 CAPSULE (0.4 MG TOTAL) BY MOUTH IN THE MORNING. 90 capsule 1   terazosin  (HYTRIN ) 2 MG capsule TAKE 1 CAPSULE BY MOUTH EVERY NIGHT AT BEDTIME 90 capsule 1   ondansetron  (ZOFRAN ) 4 MG tablet Take 1 tablet (4 mg total) by mouth every 6 (six) hours as needed for nausea or vomiting. (Patient not taking: Reported on 02/13/2024) 30 tablet 0   No facility-administered medications prior to visit.    ROS: Review of Systems  Constitutional:  Negative for appetite change, fatigue and unexpected weight change.  HENT:  Negative for congestion, nosebleeds, sneezing, sore throat and trouble swallowing.   Eyes:  Negative for itching and visual disturbance.  Respiratory:  Negative for cough.   Cardiovascular:  Negative for chest pain, palpitations and leg swelling.  Gastrointestinal:  Negative for abdominal distention, blood in stool, diarrhea and nausea.  Genitourinary:  Negative for frequency and hematuria.  Musculoskeletal:  Positive for arthralgias, back pain, gait problem and joint swelling. Negative for neck pain.  Skin:  Positive for color change. Negative for rash.  Neurological:  Negative for dizziness, tremors, speech difficulty and weakness.   Psychiatric/Behavioral:  Negative for agitation, dysphoric mood, sleep disturbance and suicidal ideas. The patient is not nervous/anxious.     Objective:  BP 135/79   Pulse 71   Temp 97.7 F (36.5 C) (Oral)   Ht 5' 9 (1.753 m)   Wt 163 lb (73.9 kg)   SpO2 98%   BMI 24.07 kg/m   BP Readings from Last 3 Encounters:  02/21/24 (!) 146/72  02/13/24 135/79  02/09/24 (!) 150/72    Wt Readings from Last 3 Encounters:  02/13/24 163 lb (73.9 kg)  02/08/24 159 lb 13.3 oz (72.5 kg)  02/07/24 163 lb (73.9 kg)    Physical Exam Constitutional:      General: He is not in acute distress.    Appearance: He is well-developed. He is obese.     Comments: NAD  Eyes:     Conjunctiva/sclera: Conjunctivae normal.     Pupils: Pupils are equal, round, and reactive to light.  Neck:     Thyroid : No thyromegaly.     Vascular: No JVD.  Cardiovascular:  Rate and Rhythm: Normal rate and regular rhythm.     Heart sounds: Normal heart sounds. No murmur heard.    No friction rub. No gallop.  Pulmonary:     Effort: Pulmonary effort is normal. No respiratory distress.     Breath sounds: Normal breath sounds. No wheezing or rales.  Chest:     Chest wall: No tenderness.  Abdominal:     General: Bowel sounds are normal. There is no distension.     Palpations: Abdomen is soft. There is no mass.     Tenderness: There is no abdominal tenderness. There is no guarding or rebound.  Musculoskeletal:        General: No tenderness. Normal range of motion.     Cervical back: Normal range of motion.     Right lower leg: No edema.     Left lower leg: No edema.  Lymphadenopathy:     Cervical: No cervical adenopathy.  Skin:    General: Skin is warm and dry.     Findings: No rash.  Neurological:     Mental Status: He is alert and oriented to person, place, and time.     Cranial Nerves: No cranial nerve deficit.     Motor: No abnormal muscle tone.     Coordination: Coordination normal.     Gait: Gait  normal.     Deep Tendon Reflexes: Reflexes are normal and symmetric.  Psychiatric:        Behavior: Behavior normal.        Thought Content: Thought content normal.        Judgment: Judgment normal.    L elbow bursitis relapsed Lab Results  Component Value Date   WBC 2.5 (L) 02/21/2024   HGB 9.4 (L) 02/21/2024   HCT 29.4 (L) 02/21/2024   PLT 51 (L) 02/21/2024   GLUCOSE 93 02/21/2024   CHOL 136 01/12/2017   TRIG 67.0 01/12/2017   HDL 54.70 01/12/2017   LDLCALC 68 01/12/2017   ALT <5 02/21/2024   AST 16 02/21/2024   NA 140 02/21/2024   K 3.9 02/21/2024   CL 107 02/21/2024   CREATININE 1.02 02/21/2024   BUN 21 02/21/2024   CO2 27 02/21/2024   TSH 3.05 10/16/2019   PSA 0.47 08/30/2016   INR 1.1 10/28/2020   HGBA1C 5.3 04/19/2021    No results found.  Assessment & Plan:   Problem List Items Addressed This Visit     Bursitis of left elbow   Chronic MDS  - f/u w/dr Federico L elbow bursitis relapsed - call Dr Celena, r/o infection      MDS (myelodysplastic syndrome) (HCC) - Primary   Chronic - f/u w/dr Federico L elbow bursitis relapsed - call Dr Celena      Vitamin B12 deficiency   On Vit B12         No orders of the defined types were placed in this encounter.     Follow-up: Return in about 3 months (around 05/15/2024) for a follow-up visit.  Marolyn Noel, MD

## 2024-02-19 DIAGNOSIS — M5416 Radiculopathy, lumbar region: Secondary | ICD-10-CM | POA: Diagnosis not present

## 2024-02-21 ENCOUNTER — Inpatient Hospital Stay

## 2024-02-21 ENCOUNTER — Other Ambulatory Visit: Payer: Self-pay | Admitting: Hematology and Oncology

## 2024-02-21 VITALS — BP 146/72 | HR 77 | Resp 17

## 2024-02-21 DIAGNOSIS — D469 Myelodysplastic syndrome, unspecified: Secondary | ICD-10-CM

## 2024-02-21 DIAGNOSIS — Z79899 Other long term (current) drug therapy: Secondary | ICD-10-CM | POA: Diagnosis not present

## 2024-02-21 DIAGNOSIS — D4620 Refractory anemia with excess of blasts, unspecified: Secondary | ICD-10-CM | POA: Diagnosis not present

## 2024-02-21 LAB — CBC WITH DIFFERENTIAL (CANCER CENTER ONLY)
Abs Immature Granulocytes: 0.02 K/uL (ref 0.00–0.07)
Basophils Absolute: 0 K/uL (ref 0.0–0.1)
Basophils Relative: 0 %
Eosinophils Absolute: 0 K/uL (ref 0.0–0.5)
Eosinophils Relative: 0 %
HCT: 29.4 % — ABNORMAL LOW (ref 39.0–52.0)
Hemoglobin: 9.4 g/dL — ABNORMAL LOW (ref 13.0–17.0)
Immature Granulocytes: 1 %
Lymphocytes Relative: 46 %
Lymphs Abs: 1.1 K/uL (ref 0.7–4.0)
MCH: 25.5 pg — ABNORMAL LOW (ref 26.0–34.0)
MCHC: 32 g/dL (ref 30.0–36.0)
MCV: 79.7 fL — ABNORMAL LOW (ref 80.0–100.0)
Monocytes Absolute: 0.6 K/uL (ref 0.1–1.0)
Monocytes Relative: 26 %
Neutro Abs: 0.7 K/uL — ABNORMAL LOW (ref 1.7–7.7)
Neutrophils Relative %: 27 %
Platelet Count: 51 K/uL — ABNORMAL LOW (ref 150–400)
RBC: 3.69 MIL/uL — ABNORMAL LOW (ref 4.22–5.81)
RDW: 15.9 % — ABNORMAL HIGH (ref 11.5–15.5)
WBC Count: 2.5 K/uL — ABNORMAL LOW (ref 4.0–10.5)
nRBC: 0 % (ref 0.0–0.2)

## 2024-02-21 LAB — CMP (CANCER CENTER ONLY)
ALT: <5 U/L (ref 0–44)
AST: 16 U/L (ref 15–41)
Albumin: 4.3 g/dL (ref 3.5–5.0)
Alkaline Phosphatase: 64 U/L (ref 38–126)
Anion gap: 6 (ref 5–15)
BUN: 21 mg/dL (ref 8–23)
CO2: 27 mmol/L (ref 22–32)
Calcium: 9.1 mg/dL (ref 8.9–10.3)
Chloride: 107 mmol/L (ref 98–111)
Creatinine: 1.02 mg/dL (ref 0.61–1.24)
GFR, Estimated: >60 mL/min (ref >60)
Glucose, Bld: 93 mg/dL (ref 70–99)
Potassium: 3.9 mmol/L (ref 3.5–5.1)
Sodium: 140 mmol/L (ref 135–145)
Total Bilirubin: 0.6 mg/dL (ref 0.0–1.2)
Total Protein: 6.9 g/dL (ref 6.5–8.1)

## 2024-02-21 LAB — SAMPLE TO BLOOD BANK

## 2024-02-21 MED ORDER — EPOETIN ALFA 20000 UNIT/ML IJ SOLN
20000.0000 [IU] | Freq: Once | INTRAMUSCULAR | Status: AC
  Administered 2024-02-21: 20000 [IU] via SUBCUTANEOUS
  Filled 2024-02-21: qty 1

## 2024-02-21 NOTE — Progress Notes (Signed)
Thanks Jay!

## 2024-02-27 DIAGNOSIS — M7022 Olecranon bursitis, left elbow: Secondary | ICD-10-CM | POA: Diagnosis not present

## 2024-02-27 DIAGNOSIS — M86022 Acute hematogenous osteomyelitis, left humerus: Secondary | ICD-10-CM | POA: Diagnosis not present

## 2024-02-28 DIAGNOSIS — H43812 Vitreous degeneration, left eye: Secondary | ICD-10-CM | POA: Diagnosis not present

## 2024-02-28 DIAGNOSIS — H401131 Primary open-angle glaucoma, bilateral, mild stage: Secondary | ICD-10-CM | POA: Diagnosis not present

## 2024-02-28 DIAGNOSIS — H401130 Primary open-angle glaucoma, bilateral, stage unspecified: Secondary | ICD-10-CM | POA: Diagnosis not present

## 2024-02-28 DIAGNOSIS — H353221 Exudative age-related macular degeneration, left eye, with active choroidal neovascularization: Secondary | ICD-10-CM | POA: Diagnosis not present

## 2024-02-28 DIAGNOSIS — H353111 Nonexudative age-related macular degeneration, right eye, early dry stage: Secondary | ICD-10-CM | POA: Diagnosis not present

## 2024-03-06 ENCOUNTER — Inpatient Hospital Stay

## 2024-03-06 ENCOUNTER — Telehealth: Payer: Self-pay | Admitting: *Deleted

## 2024-03-06 ENCOUNTER — Inpatient Hospital Stay: Attending: Physician Assistant

## 2024-03-06 VITALS — BP 125/63 | HR 60 | Temp 97.5°F | Resp 16

## 2024-03-06 DIAGNOSIS — D469 Myelodysplastic syndrome, unspecified: Secondary | ICD-10-CM

## 2024-03-06 DIAGNOSIS — D4620 Refractory anemia with excess of blasts, unspecified: Secondary | ICD-10-CM | POA: Insufficient documentation

## 2024-03-06 LAB — CBC WITH DIFFERENTIAL (CANCER CENTER ONLY)
Abs Immature Granulocytes: 0 K/uL (ref 0.00–0.07)
Basophils Absolute: 0 K/uL (ref 0.0–0.1)
Basophils Relative: 1 %
Eosinophils Absolute: 0 K/uL (ref 0.0–0.5)
Eosinophils Relative: 1 %
HCT: 31.2 % — ABNORMAL LOW (ref 39.0–52.0)
Hemoglobin: 9.9 g/dL — ABNORMAL LOW (ref 13.0–17.0)
Immature Granulocytes: 0 %
Lymphocytes Relative: 50 %
Lymphs Abs: 0.9 K/uL (ref 0.7–4.0)
MCH: 25.3 pg — ABNORMAL LOW (ref 26.0–34.0)
MCHC: 31.7 g/dL (ref 30.0–36.0)
MCV: 79.8 fL — ABNORMAL LOW (ref 80.0–100.0)
Monocytes Absolute: 0.5 K/uL (ref 0.1–1.0)
Monocytes Relative: 25 %
Neutro Abs: 0.4 K/uL — CL (ref 1.7–7.7)
Neutrophils Relative %: 23 %
Platelet Count: 25 K/uL — ABNORMAL LOW (ref 150–400)
RBC: 3.91 MIL/uL — ABNORMAL LOW (ref 4.22–5.81)
RDW: 17.2 % — ABNORMAL HIGH (ref 11.5–15.5)
WBC Count: 1.8 K/uL — ABNORMAL LOW (ref 4.0–10.5)
nRBC: 0 % (ref 0.0–0.2)

## 2024-03-06 LAB — CMP (CANCER CENTER ONLY)
ALT: <5 U/L (ref 0–44)
AST: 16 U/L (ref 15–41)
Albumin: 4.2 g/dL (ref 3.5–5.0)
Alkaline Phosphatase: 57 U/L (ref 38–126)
Anion gap: 6 (ref 5–15)
BUN: 21 mg/dL (ref 8–23)
CO2: 27 mmol/L (ref 22–32)
Calcium: 9.4 mg/dL (ref 8.9–10.3)
Chloride: 107 mmol/L (ref 98–111)
Creatinine: 1.09 mg/dL (ref 0.61–1.24)
GFR, Estimated: >60 mL/min (ref >60)
Glucose, Bld: 123 mg/dL — ABNORMAL HIGH (ref 70–99)
Potassium: 3.9 mmol/L (ref 3.5–5.1)
Sodium: 140 mmol/L (ref 135–145)
Total Bilirubin: 0.7 mg/dL (ref 0.0–1.2)
Total Protein: 7.1 g/dL (ref 6.5–8.1)

## 2024-03-06 LAB — SAMPLE TO BLOOD BANK

## 2024-03-06 LAB — ABO/RH: ABO/RH(D): O NEG

## 2024-03-06 MED ORDER — EPOETIN ALFA 20000 UNIT/ML IJ SOLN
20000.0000 [IU] | Freq: Once | INTRAMUSCULAR | Status: AC
  Administered 2024-03-06: 20000 [IU] via SUBCUTANEOUS
  Filled 2024-03-06: qty 1

## 2024-03-06 NOTE — Telephone Encounter (Signed)
 CRITICAL VALUE STICKER  CRITICAL VALUE: ANC 0.4  RECEIVER (on-site recipient of call):[name] Marg Macmaster, RN  DATE & TIME NOTIFIED: 03/06/24  10:00 am  MESSENGER (representative from lab):Jessica  MD NOTIFIED: Dr. Federico  TIME OF NOTIFICATION: 11:00am  RESPONSE:  No change in current treatment

## 2024-03-07 ENCOUNTER — Encounter: Payer: Self-pay | Admitting: Hematology and Oncology

## 2024-03-10 ENCOUNTER — Ambulatory Visit: Admitting: Internal Medicine

## 2024-03-11 ENCOUNTER — Encounter: Payer: Self-pay | Admitting: Internal Medicine

## 2024-03-11 ENCOUNTER — Ambulatory Visit (INDEPENDENT_AMBULATORY_CARE_PROVIDER_SITE_OTHER): Admitting: Internal Medicine

## 2024-03-11 VITALS — BP 130/72 | HR 77 | Temp 98.5°F | Ht 69.0 in | Wt 157.6 lb

## 2024-03-11 DIAGNOSIS — E538 Deficiency of other specified B group vitamins: Secondary | ICD-10-CM

## 2024-03-11 DIAGNOSIS — Z23 Encounter for immunization: Secondary | ICD-10-CM

## 2024-03-11 DIAGNOSIS — D649 Anemia, unspecified: Secondary | ICD-10-CM

## 2024-03-11 DIAGNOSIS — I2510 Atherosclerotic heart disease of native coronary artery without angina pectoris: Secondary | ICD-10-CM | POA: Diagnosis not present

## 2024-03-11 DIAGNOSIS — F1021 Alcohol dependence, in remission: Secondary | ICD-10-CM | POA: Diagnosis not present

## 2024-03-11 DIAGNOSIS — L03116 Cellulitis of left lower limb: Secondary | ICD-10-CM | POA: Diagnosis not present

## 2024-03-11 NOTE — Assessment & Plan Note (Signed)
 MDS related F/u w/Dr Federico

## 2024-03-11 NOTE — Assessment & Plan Note (Signed)
 Resolving - on abx Duricef F/u w/ID, Dr Dea

## 2024-03-11 NOTE — Assessment & Plan Note (Signed)
 On Vit B12

## 2024-03-11 NOTE — Assessment & Plan Note (Signed)
 No angina

## 2024-03-11 NOTE — Progress Notes (Signed)
 Subjective:  Patient ID: Justin ORN Randine Raddle., male    DOB: 09-25-1940  Age: 83 y.o. MRN: [id number]  CC: Follow-up (Patient states left leg had cellulitis and he's here because Dr. Garald wanted to check it out. Left elbow, big lump Dr. Celena drained it but it came back the next day. )   HPI Justin Farver [employer]. presents for RLS, MDS, edema  Outpatient Medications Prior to Visit  Medication Sig Dispense Refill   acetaminophen  (TYLENOL ) 500 MG tablet Take 1,000 mg by mouth every 6 (six) hours as needed for mild pain (pain score 1-3) or moderate pain (pain score 4-6).     amLODipine  (NORVASC ) 5 MG tablet TAKE 1 TABLET BY MOUTH DAILY 90 tablet 3   calcium  carbonate (TUMS EX) 750 MG chewable tablet Chew 750-1,500 mg by mouth 4 (four) times daily as needed for heartburn.     carbidopa -levodopa  (SINEMET  IR) 25-100 MG tablet For breakthrough restless leg, you can take 1 tablet at bedtime.  OK to take extra tab as needed during the day. 100 tablet 3   cefadroxil  (DURICEF) 500 MG capsule Take 2 capsules (1,000 mg total) by mouth 2 (two) times daily. (Patient taking differently: Take 1,000 mg by mouth 2 (two) times daily. 02/11/24: Confirms without prompting during TOC call, he plans to resume taking as ordered post-hospital discharge on 02/14/24 when linezoid prescription has been completed) 120 capsule 0   Cholecalciferol  1000 UNITS tablet Take 1,000 Units by mouth in the morning, at noon, and at bedtime.     Cyanocobalamin  (VITAMIN B-12) 1000 MCG SUBL DISSOLVE 2 TABLETS IN MOUTH EVERY DAY 180 tablet 1   finasteride  (PROSCAR ) 5 MG tablet TAKE 1 TABLET (5 MG TOTAL) BY MOUTH DAILY. 90 tablet 1   furosemide  (LASIX ) 20 MG tablet Take 20 mg by mouth as needed for edema or fluid.     gabapentin  (NEURONTIN ) 300 MG capsule Take 1 capsule (300 mg total) by mouth 3 (three) times daily.     ibuprofen (ADVIL) 200 MG tablet Take 400 mg by mouth every 6 (six) hours as needed for mild pain (pain score 1-3) or  moderate pain (pain score 4-6).     magnesium  hydroxide (MILK OF MAGNESIA) 400 MG/5ML suspension Take 15 mLs by mouth daily as needed for mild constipation.     Multiple Vitamins-Minerals (PRESERVISION AREDS PO) Take 1 tablet by mouth 2 (two) times daily.     ondansetron  (ZOFRAN ) 4 MG tablet Take 1 tablet (4 mg total) by mouth every 6 (six) hours as needed for nausea or vomiting. 30 tablet 0   Oxycodone  HCl 10 MG TABS Take 1 tablet (10 mg total) by mouth 5 (five) times daily for 5 days. 25 tablet 0   Polyethyl Glycol-Propyl Glycol (SYSTANE OP) Place 1 drop into both eyes 2 (two) times daily as needed (dry eyes). [facility]     pravastatin  (PRAVACHOL ) 20 MG tablet TAKE 1 TABLET (20 MG TOTAL) BY MOUTH DAILY. 90 tablet 1   rOPINIRole  (REQUIP ) 2 MG tablet TAKE 1 TABLET BY MOUTH AT AT NOON AND 1 TAB AT 4PM AND 1 TAB AT BEDTIME 270 tablet 1   tamsulosin  (FLOMAX ) 0.4 MG CAPS capsule TAKE 1 CAPSULE (0.4 MG TOTAL) BY MOUTH IN THE MORNING. 90 capsule 1   terazosin  (HYTRIN ) 2 MG capsule TAKE 1 CAPSULE BY MOUTH EVERY NIGHT AT BEDTIME 90 capsule 1   No facility-administered medications prior to visit.    ROS: Review of Systems  Constitutional:  Negative for appetite change, fatigue and unexpected weight change.  HENT:  Negative for congestion, nosebleeds, sneezing, sore throat and trouble swallowing.   Eyes:  Negative for itching and visual disturbance.  Respiratory:  Negative for cough.   Cardiovascular:  Positive for leg swelling. Negative for chest pain and palpitations.  Gastrointestinal:  Negative for abdominal distention, blood in stool, diarrhea and nausea.  Genitourinary:  Negative for frequency and hematuria.  Musculoskeletal:  Positive for arthralgias, back pain and gait problem. Negative for joint swelling and neck pain.  Skin:  Negative for rash.  Neurological:  Negative for dizziness, tremors, speech difficulty and weakness.  Psychiatric/Behavioral:  Positive for sleep disturbance. Negative  for agitation, dysphoric mood and suicidal ideas. The patient is not nervous/anxious.     Objective:  BP 130/72   Pulse 77   Temp 98.5 F (36.9 C) (Oral)   Ht 5' 9 (1.753 m)   Wt 157 lb 9.6 oz (71.5 kg)   SpO2 94%   BMI 23.27 kg/m   BP Readings from Last 3 Encounters:  03/11/24 130/72  03/06/24 125/63  02/21/24 (!) 146/72    Wt Readings from Last 3 Encounters:  03/11/24 157 lb 9.6 oz (71.5 kg)  02/13/24 163 lb (73.9 kg)  02/08/24 159 lb 13.3 oz (72.5 kg)    Physical Exam Constitutional:      General: He is not in acute distress.    Appearance: He is well-developed. He is not toxic-appearing or diaphoretic.     Comments: NAD  HENT:     Head: Normocephalic and atraumatic.     Right Ear: External ear normal.     Left Ear: External ear normal.     Nose: Nose normal.     Mouth/Throat:     Pharynx: No oropharyngeal exudate.  Eyes:     General: No scleral icterus.       Right eye: No discharge.        Left eye: No discharge.     Conjunctiva/sclera: Conjunctivae normal.     Pupils: Pupils are equal, round, and reactive to light.  Neck:     Thyroid : No thyromegaly.     Vascular: No JVD.     Trachea: No tracheal deviation.  Cardiovascular:     Rate and Rhythm: Normal rate and regular rhythm.     Heart sounds: Normal heart sounds. No murmur heard.    No friction rub. No gallop.  Pulmonary:     Effort: Pulmonary effort is normal. No respiratory distress.     Breath sounds: Normal breath sounds. No stridor. No wheezing or rales.  Chest:     Chest wall: No tenderness.  Abdominal:     General: Bowel sounds are normal. There is no distension.     Palpations: Abdomen is soft. There is no mass.     Tenderness: There is no abdominal tenderness. There is no guarding or rebound.  Genitourinary:    Penis: Normal. No tenderness.      Prostate: Normal.     Rectum: Normal. Guaiac result negative.  Musculoskeletal:        General: Deformity present. No tenderness. Normal  range of motion.     Cervical back: Normal range of motion and neck supple.     Right lower leg: Edema present.     Left lower leg: Edema present.  Lymphadenopathy:     Cervical: No cervical adenopathy.  Skin:    General: Skin is warm and dry.     Coloration:  Skin is not pale.     Findings: No erythema or rash.  Neurological:     Mental Status: He is alert and oriented to person, place, and time.     Cranial Nerves: No cranial nerve deficit.     Motor: Weakness present. No abnormal muscle tone.     Coordination: Coordination normal.     Gait: Gait abnormal.     Deep Tendon Reflexes: Reflexes are normal and symmetric. Reflexes normal.  Psychiatric:        Behavior: Behavior normal.        Thought Content: Thought content normal.        Judgment: Judgment normal.   1+ ankle edema L, trace R L hand deformity L elbow bursitis walker Lab Results  Component Value Date   WBC 1.8 (L) 03/06/2024   HGB 9.9 (L) 03/06/2024   HCT 31.2 (L) 03/06/2024   PLT 25 (L) 03/06/2024   GLUCOSE 123 (H) 03/06/2024   CHOL 136 01/12/2017   TRIG 67.0 01/12/2017   HDL 54.70 01/12/2017   LDLCALC 68 01/12/2017   ALT <5 03/06/2024   AST 16 03/06/2024   NA 140 03/06/2024   K 3.9 03/06/2024   CL 107 03/06/2024   CREATININE 1.09 03/06/2024   BUN 21 03/06/2024   CO2 27 03/06/2024   TSH 3.05 10/16/2019   PSA 0.47 08/30/2016   INR 1.1 10/28/2020   HGBA1C 5.3 04/19/2021    No results found.  Assessment & Plan:   Problem List Items Addressed This Visit     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)   Dry x 29 years      Anemia   MDS related F/u w/Dr Federico      Cellulitis of left leg   Resolving - on abx Duricef F/u w/ID, Dr Dea      Coronary artery disease   No angina       Vitamin B12 deficiency   On Vit B12      Other Visit Diagnoses       Immunization due    -  Primary   Relevant Orders   Flu vaccine HIGH DOSE PF(Fluzone Trivalent) (Completed)         No orders of the  defined types were placed in this encounter.     Follow-up: Return in about 4 months (around 07/11/2024) for a follow-up visit.  Marolyn Noel, MD

## 2024-03-11 NOTE — Assessment & Plan Note (Signed)
 Dry x 29 years

## 2024-03-14 ENCOUNTER — Other Ambulatory Visit: Payer: Self-pay

## 2024-03-14 ENCOUNTER — Ambulatory Visit (INDEPENDENT_AMBULATORY_CARE_PROVIDER_SITE_OTHER): Admitting: Infectious Diseases

## 2024-03-14 VITALS — BP 133/69 | HR 68 | Temp 97.8°F | Wt 157.0 lb

## 2024-03-14 DIAGNOSIS — M71122 Other infective bursitis, left elbow: Secondary | ICD-10-CM

## 2024-03-14 DIAGNOSIS — M5459 Other low back pain: Secondary | ICD-10-CM

## 2024-03-14 DIAGNOSIS — Z79899 Other long term (current) drug therapy: Secondary | ICD-10-CM | POA: Diagnosis not present

## 2024-03-14 DIAGNOSIS — A4901 Methicillin susceptible Staphylococcus aureus infection, unspecified site: Secondary | ICD-10-CM | POA: Diagnosis not present

## 2024-03-14 DIAGNOSIS — D696 Thrombocytopenia, unspecified: Secondary | ICD-10-CM

## 2024-03-14 DIAGNOSIS — G061 Intraspinal abscess and granuloma: Secondary | ICD-10-CM | POA: Diagnosis not present

## 2024-03-14 NOTE — Progress Notes (Unsigned)
 Patient Active Problem List   Diagnosis Date Noted   Bursitis of left elbow 02/13/2024   Cellulitis and abscess of left leg 02/08/2024   Cellulitis of left leg 02/07/2024   Intertrigo 12/16/2023   MSSA (methicillin susceptible Staphylococcus aureus) infection 12/11/2023   Swelling of lower leg 11/17/2023   PICC (peripherally inserted central catheter) in place 11/17/2023   Medication management 11/17/2023   Erysipelas of both lower extremities 11/12/2023   Urinary incontinence 11/12/2023   Septic bursitis of elbow 11/06/2023   Abscess of epidural space of spine due to bacteria 10/25/2023   Epidural abscess 10/25/2023   MSSA bacteremia 10/22/2023   Hypokalemia 10/21/2023   Acute on chronic pain of left elbow concerning for septic arthritis 10/21/2023   Spinal stenosis of lumbar region 10/16/2023   Pseudophakia of both eyes 02/15/2022   MDS (myelodysplastic syndrome) (HCC) 12/25/2021   Abnormal EKG 11/18/2021   Thrombocytopenia (HCC) 11/18/2021   Lumbar spondylosis 07/22/2021   Lumbar radiculopathy 03/24/2021   S/P total knee arthroplasty, right 11/09/2020   Weight loss 08/30/2020   Complication associated with orthopedic device (HCC) 06/10/2020   Diastolic dysfunction 10/27/2019   Exudative age-related macular degeneration of left eye with active choroidal neovascularization (HCC) 10/15/2019   Intermediate stage nonexudative age-related macular degeneration of left eye 10/15/2019   Early stage nonexudative age-related macular degeneration of right eye 10/15/2019   Pseudophakia 10/15/2019   Posterior vitreous detachment of left eye 10/15/2019   GERD (gastroesophageal reflux disease) 11/13/2018   Coronary artery disease 10/24/2018   Claudication (HCC) 02/22/2018   Anemia 08/28/2017   DOE (dyspnea on exertion) 07/31/2017   Fatigue 07/31/2017   Essential hypertension 11/29/2016   Smoking greater than 30 pack years 02/02/2016   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) 02/28/2013    Ingrowing toenail of left foot 08/29/2012   Allergic rhinitis 10/10/2011   Dupuytren's contracture of hand 07/05/2011   Vitamin B12 deficiency 12/23/2010   BPH (benign prostatic hyperplasia) 12/23/2010   Low back pain 07/27/2009   TOBACCO USE, QUIT 07/27/2009   Diverticulosis of colon 03/28/2009   RLS (restless legs syndrome) 10/28/2008   SKIN RASH, ALLERGIC 10/01/2008   Edema 10/01/2008   Neuropathy 04/22/2008   Vitamin D  deficiency 01/21/2008   INSOMNIA, CHRONIC 12/23/2007   Osteoarthritis 12/23/2007   CAROTID BRUIT, LEFT 12/23/2007   Current Outpatient Medications on File Prior to Visit  Medication Sig Dispense Refill   acetaminophen  (TYLENOL ) 500 MG tablet Take 1,000 mg by mouth every 6 (six) hours as needed for mild pain (pain score 1-3) or moderate pain (pain score 4-6).     amLODipine  (NORVASC ) 5 MG tablet TAKE 1 TABLET BY MOUTH DAILY 90 tablet 3   calcium  carbonate (TUMS EX) 750 MG chewable tablet Chew 750-1,500 mg by mouth 4 (four) times daily as needed for heartburn.     carbidopa -levodopa  (SINEMET  IR) 25-100 MG tablet For breakthrough restless leg, you can take 1 tablet at bedtime.  OK to take extra tab as needed during the day. 100 tablet 3   cefadroxil  (DURICEF) 500 MG capsule Take 2 capsules (1,000 mg total) by mouth 2 (two) times daily. (Patient taking differently: Take 1,000 mg by mouth 2 (two) times daily. 02/11/24: Confirms without prompting during TOC call, he plans to resume taking as ordered post-hospital discharge on 02/14/24 when linezoid prescription has been completed) 120 capsule 0   Cholecalciferol  1000 UNITS tablet Take 1,000 Units by mouth in the morning, at noon, and at bedtime.  Cyanocobalamin  (VITAMIN B-12) 1000 MCG SUBL DISSOLVE 2 TABLETS IN MOUTH EVERY DAY 180 tablet 1   finasteride  (PROSCAR ) 5 MG tablet TAKE 1 TABLET (5 MG TOTAL) BY MOUTH DAILY. 90 tablet 1   furosemide  (LASIX ) 20 MG tablet Take 20 mg by mouth as needed for edema or fluid.      gabapentin  (NEURONTIN ) 300 MG capsule Take 1 capsule (300 mg total) by mouth 3 (three) times daily.     ibuprofen (ADVIL) 200 MG tablet Take 400 mg by mouth every 6 (six) hours as needed for mild pain (pain score 1-3) or moderate pain (pain score 4-6).     magnesium  hydroxide (MILK OF MAGNESIA) 400 MG/5ML suspension Take 15 mLs by mouth daily as needed for mild constipation.     Multiple Vitamins-Minerals (PRESERVISION AREDS PO) Take 1 tablet by mouth 2 (two) times daily.     ondansetron  (ZOFRAN ) 4 MG tablet Take 1 tablet (4 mg total) by mouth every 6 (six) hours as needed for nausea or vomiting. 30 tablet 0   Oxycodone  HCl 10 MG TABS Take 1 tablet (10 mg total) by mouth 5 (five) times daily for 5 days. 25 tablet 0   Polyethyl Glycol-Propyl Glycol (SYSTANE OP) Place 1 drop into both eyes 2 (two) times daily as needed (dry eyes). [facility]     pravastatin  (PRAVACHOL ) 20 MG tablet TAKE 1 TABLET (20 MG TOTAL) BY MOUTH DAILY. 90 tablet 1   rOPINIRole  (REQUIP ) 2 MG tablet TAKE 1 TABLET BY MOUTH AT AT NOON AND 1 TAB AT 4PM AND 1 TAB AT BEDTIME 270 tablet 1   tamsulosin  (FLOMAX ) 0.4 MG CAPS capsule TAKE 1 CAPSULE (0.4 MG TOTAL) BY MOUTH IN THE MORNING. 90 capsule 1   terazosin  (HYTRIN ) 2 MG capsule TAKE 1 CAPSULE BY MOUTH EVERY NIGHT AT BEDTIME 90 capsule 1   No current facility-administered medications on file prior to visit.   Subjective: Discussed the use of AI scribe software for clinical note transcription with the patient, who gave verbal consent to proceed.   83 year old male with prior history of HTN, CAD, MDS with thrombocytopenia, depression BCC, BPH, melanoma, GERD, DDD, OA, TIA, h/o Tobacco/alcohol  abuse who is here for HFU 4/20-4/25 for left elbow septic bursitis and lumbar epidural abscess.  Patient had an open fracture of his left elbow in 2007. Approximately 4 years ago one of the pins came loose and it was taken out and he had limited range of motion in his left elbow with inability to fully  flex and extend and unable to function with his left hand. Left Elbow x-ray as below. 4/20 left elbow SF Gram stain with GPC. Cx with MSSA.  4/20 blood culture with MSSA.  4/22 underwent I&D with removal of hardware, 1 screw remaining ( no OP note available). OR cx with MSSA and MSSE. TTE and TEE negative for vegetations or endocarditis.  MRI L spine septic arthritis of left L5-S1.  Left dorsal epidural abscess extending from L4-S1, phlegmonous change in the left psoas.  No surgical intervention per neurosurgery.  Also required PRBC transfusion for anemia. Discharged on 4/25 to complete 8 weeks course of IV cefazolin  through 6/17  5/15 Patient is accompanied by his daughter surgery.  Getting IV antibiotics through PICC without any concerns related to antibiotics or PICC.  Cefazolin  was recently reduced to 1 g every 12 dosing due to elevated kidney function.  Daughter reports increased swelling and redness in his left leg compared to right leg  for the last few days history of chronic intermittent swelling of bilateral legs.  Seen by PCP on Monday and was started on a 7-day course of doxycycline .  Patient is also on diuretic. Saw Dr. Celena yesterday and discharged from his care as left elbow has healed. Also saw Dr Mavis post discharge and, wants to repeat MRI end of month to see if improvement and needs for +/- surgery.  6/10 Accompanied by daughter. Getting IV cefazolin  through PICC without any concerns. Left elbow wound has healed. He has experienced significant improvement in mobility since the last visit three weeks ago. He is now able to walk to the bathroom and bed with the aid of a walker and can walk to the car instead of using a wheelchair. However, he continues to use a wheelchair for longer distances due to balance issues.  He experiences significant back pain, particularly when lying down, rating it close to ten on a scale of one to ten. The pain is alleviated when sitting. He is under pain  management with oxycodone , which he takes as needed, usually less than the prescribed five times a day. He is scheduled for an MRI 6/11.  Next fu with Dr Mavis on July 1. Has one more fu with Dr Celena. Seen by PCP on 6/3, leg swelling has improved since last visit and has stopped lasix  recently due to need to frequently urinate at night.   8/4 He was seen by Dr Overton last couple of times. 6/23 visit, cefadroxil  changed to PO linezolid  for 7 days due to concerns for infection in left elbow then switched back to PO cefadroxil  which he has been taking till date without concerns.   Follows pain specialist Dr Bonner for back pain, back pain is 2-3/10 at baseline and 7/10 when he walks around, lies at back. Was prescribed prednisone  taper last Friday from pain clinic and has 2 more days left, can't tell if made a difference. Discharged by Neurosurgery.   Left elbow with no concerns, and Dr Celena had no concerns as well per daughter.   9/12 Taking p.o. cefadroxil  2 tablets twice daily without missing doses or concerns.  Reports being seen by Dr. Celena 3 weeks ago and got aspiration of left elbow done which was bloody and no growth in cultures.  No concerns in left elbow although does not have full mobility of left elbow, which is chronic per daughter.  Last seen by PCP on 9/9. No complaints otherwise.   Review of Systems: All systems reviewed and negative except as above.  Denies fever, chills, nausea, vomiting or diarrhea.  Past Medical History:  Diagnosis Date   Alcoholism (HCC)    Dry 13 years   Basal cell carcinoma    Right Chest   BPH (benign prostatic hyperplasia)    Diverticulosis of colon    Dizzy spells    none recent   DJD (degenerative joint disease)    lower back   Elbow fracture, left 2009   Dr Celena   FRACTURE, [place], LEFT 12/23/2007   Qualifier: Diagnosis of   By: Georgian ROSALEA CHARM Lamar        GERD (gastroesophageal reflux disease)    Glaucoma    History of TIAs 1 yrs ago    Hyperkeratosis    LBP (low back pain)    Macular degeneration    left wet right dry sees dr elner   Melanoma [facility]) 2009   x3 Dr Amy [place]   Osteoarthritis  Restless leg syndrome    Thrombocytopenia (HCC) 11/18/2021   Tinnitus    Tobacco abuse    Uses walker    Wears glasses    reading   Wears partial dentures    upper    Past Surgical History:  Procedure Laterality Date   CATARACT EXTRACTION Bilateral 2018   HARDWARE REMOVAL Left 07/09/2020   Procedure: Left elbow hardware removal;  Surgeon: Sharl Selinda Dover, MD;  Location: [facility];  Service: Orthopedics;  Laterality: Left;  60 mins   HARDWARE REMOVAL Left 10/23/2023   Procedure: REMOVAL, HARDWARE;  Surgeon: Celena Sharper, MD;  Location: [facility] OR;  Service: Orthopedics;  Laterality: Left;   IRRIGATION AND DEBRIDEMENT ELBOW Left 10/23/2023   Procedure: IRRIGATION AND DEBRIDEMENT ELBOW;  Surgeon: Celena Sharper, MD;  Location[facility] OR;  Service: Orthopedics;  Laterality: Left;   mda     Dr. Elner wet injection   MELANOMA EXCISION  2009   x3   RECONSTRUCTION MEDIAL COLLATERAL LIGAMENT ELBOW W/ TENDON GRAFT  2009   Left, Dr Celena   TONSILLECTOMY  as child   TOTAL KNEE ARTHROPLASTY Right 11/09/2020   Procedure: TOTAL KNEE ARTHROPLASTY;  Surgeon: Ernie Cough, MD;  Location: [facility] ORS;  Service: Orthopedics;  Laterality: Right;   TRANSESOPHAGEAL ECHOCARDIOGRAM (CATH LAB) N/A 10/24/2023   Procedure: TRANSESOPHAGEAL ECHOCARDIOGRAM;  Surgeon: Jeffrie Oneil BROCKS, MD;  Location: [facility] INVASIVE CV LAB;  Service: Cardiovascular;  Laterality: N/A;    Social History   Tobacco Use   Smoking status: Former    Current packs/day: 0.00    Average packs/day: 3.0 packs/day for 38.0 years (114.0 ttl pk-yrs)    Types: Cigarettes    Start date: 13    Quit date: 51    Years since quitting: 39.7   Smokeless tobacco: Never   Tobacco comments:    states he quit May 15 1985  Vaping Use   Vaping status: Never Used  Substance Use  Topics   Alcohol  use: No    Comment: PREVIOUS - DRY 19yrs   Drug use: Not Currently    Family History  Problem Relation Age of Onset   Cancer Mother        ?breast   Cancer Father        Lung    Allergies  Allergen Reactions   Levofloxacin Other (See Comments)    REACTION: hands pealed    Health Maintenance  Topic Date Due   COVID-19 Vaccine (4 - 2025-26 season) 03/03/2024   Medicare Annual Wellness (AWV)  07/19/2024   DTaP/Tdap/Td (3 - Td or Tdap) 05/24/2032   Pneumococcal Vaccine: 50+ Years  Completed   Influenza Vaccine  Completed   HPV VACCINES  Aged Out   Meningococcal B Vaccine  Aged Out   Zoster Vaccines- Shingrix  Discontinued   Objective: There were no vitals taken for this visit.   Physical Exam Constitutional:      Appearance: Normal appearance.  HENT:     Head: Normocephalic and atraumatic.      Mouth: Mucous membranes are moist.  Eyes:    Conjunctiva/sclera: Conjunctivae normal.     Pupils: Pupils are equal, round, and b/l symmetrical    Cardiovascular:     Rate and Rhythm: Normal rate and regular rhythm.     Heart sounds:  Pulmonary:     Effort: Pulmonary effort is normal.     Breath sounds:  Abdominal:     General: Non distended     Palpations:  Musculoskeletal:        General: walking with support. Left elbow wound has healed with no signs of infection. NO wound in the back.   Skin:    General: Skin is warm and dry.     Comments:   Neurological:     General: grossly non focal     Mental Status: awake, alert and oriented to person, place, and time.   Psychiatric:        Mood and Affect: Mood normal.   Lab Results Lab Results  Component Value Date   WBC 1.8 (L) 03/06/2024   HGB 9.9 (L) 03/06/2024   HCT 31.2 (L) 03/06/2024   MCV 79.8 (L) 03/06/2024   PLT 25 (L) 03/06/2024    Lab Results  Component Value Date   CREATININE 1.09 03/06/2024   BUN 21 03/06/2024   NA 140 03/06/2024   K 3.9 03/06/2024   CL 107 03/06/2024    CO2 27 03/06/2024    Lab Results  Component Value Date   ALT <5 03/06/2024   AST 16 03/06/2024   ALKPHOS 57 03/06/2024   BILITOT 0.7 03/06/2024    Lab Results  Component Value Date   CHOL 136 01/12/2017   HDL 54.70 01/12/2017   LDLCALC 68 01/12/2017   TRIG 67.0 01/12/2017   CHOLHDL 2 01/12/2017   No results found for: LABRPR, RPRTITER No results found for: HIV1RNAQUANT, HIV1RNAVL, CD4TABS   Microbiology Results for orders placed or performed in visit on 02/07/24  Culture, blood (Routine X 2) w Reflex to ID Panel     Status: None   Collection Time: 02/07/24  2:32 PM   Specimen: BLOOD RIGHT ARM  Result Value Ref Range Status   Specimen Description BLOOD RIGHT ARM  Final   Special Requests   Final    BOTTLES DRAWN AEROBIC AND ANAEROBIC Blood Culture results may not be optimal due to an inadequate volume of blood received in culture bottles   Culture   Final    NO GROWTH 5 DAYS Performed at [facility], [address], [place], [place] [address]    Report Status 02/12/2024 FINAL  Final  Culture, blood (Routine X 2) w Reflex to ID Panel     Status: None   Collection Time: 02/07/24  2:33 PM   Specimen: BLOOD RIGHT HAND  Result Value Ref Range Status   Specimen Description BLOOD RIGHT HAND  Final   Special Requests   Final    BOTTLES DRAWN AEROBIC AND ANAEROBIC Blood Culture results may not be optimal due to an inadequate volume of blood received in culture bottles   Culture   Final    NO GROWTH 5 DAYS Performed at [facility], [address] [place], [place] [address]    Report Status 02/12/2024 FINAL  Final   Imaging          Assessment/Plan # Complicated MSSA bacteremia # Left elbow septic  bursitis # Lumbar epidural abscess  - 4/22 blood cx cleared  - 4/15 TTE and 4/23 TEE negative for endocarditis  - 4/22 s/p I and D with hardware removal with one screw remaining. Or Cx MSSA and MSSE - no surgical intervention per neurosurgery -  S/p IV cefazolin  through 6/17 > PO cefadroxil > linezolid  for a week during  OV with Dr Overton on 6/23 for concerns of Left elbow followed by PO cefadroxil   till date  Plan  - Continue po cefadroxil  as is  - I have messaged  Dr Celena if any concerns for hardware infection in the remaining screw in left elbow. If not, will plan to stop antibiotics soon.  - fu in a month   # Chronic back pain - follows pain clinic and pain management per them  # Medication Management  - 8/21 and 9/4 CBC and CMP reviewed and discussed - ESR and CRP today  # MDS w thrombocytopenia  - on retacrit  every 2 weeks  - Follows Oncology, last seen 7/24  I spent 25  minutes involved in face-to-face and non-face-to-face activities for this patient on the day of the visit. Professional time spent includes the following activities: Preparing to see the patient (review of tests), Obtaining and reviewing separately obtained history ( last 2 notes from Dr Overton, last Oncology note from Dr Federico), communicating with Dr Celena, Performing a medically appropriate examination and evaluation, Documenting clinical information in the EMR, Independently interpreting results (not separately reported), Communicating results to the patient/daughter, Counseling and educating the patient/daughter and Care coordination (not separately reported).   Of note, portions of this note may have been created with voice recognition software. While this note has been edited for accuracy, occasional wrong-word or 'sound-a-like' substitutions may have occurred due to the inherent limitations of voice recognition software.   Annalee Joseph, MD [facility] 03/14/2024, 8:40 AM

## 2024-03-15 ENCOUNTER — Other Ambulatory Visit: Payer: Self-pay | Admitting: Emergency Medicine

## 2024-03-20 ENCOUNTER — Inpatient Hospital Stay

## 2024-03-20 ENCOUNTER — Telehealth: Payer: Self-pay

## 2024-03-20 VITALS — BP 137/70 | HR 81 | Temp 97.7°F

## 2024-03-20 DIAGNOSIS — D469 Myelodysplastic syndrome, unspecified: Secondary | ICD-10-CM

## 2024-03-20 DIAGNOSIS — D4620 Refractory anemia with excess of blasts, unspecified: Secondary | ICD-10-CM | POA: Diagnosis not present

## 2024-03-20 LAB — SAMPLE TO BLOOD BANK

## 2024-03-20 LAB — CMP (CANCER CENTER ONLY)
ALT: 6 U/L (ref 0–44)
AST: 18 U/L (ref 15–41)
Albumin: 4.4 g/dL (ref 3.5–5.0)
Alkaline Phosphatase: 64 U/L (ref 38–126)
Anion gap: 6 (ref 5–15)
BUN: 22 mg/dL (ref 8–23)
CO2: 25 mmol/L (ref 22–32)
Calcium: 9.3 mg/dL (ref 8.9–10.3)
Chloride: 109 mmol/L (ref 98–111)
Creatinine: 1.1 mg/dL (ref 0.61–1.24)
GFR, Estimated: >60 mL/min (ref >60)
Glucose, Bld: 108 mg/dL — ABNORMAL HIGH (ref 70–99)
Potassium: 4 mmol/L (ref 3.5–5.1)
Sodium: 140 mmol/L (ref 135–145)
Total Bilirubin: 0.8 mg/dL (ref 0.0–1.2)
Total Protein: 7.2 g/dL (ref 6.5–8.1)

## 2024-03-20 LAB — CBC WITH DIFFERENTIAL (CANCER CENTER ONLY)
Abs Immature Granulocytes: 0.01 K/uL (ref 0.00–0.07)
Basophils Absolute: 0 K/uL (ref 0.0–0.1)
Basophils Relative: 1 %
Eosinophils Absolute: 0 K/uL (ref 0.0–0.5)
Eosinophils Relative: 1 %
HCT: 33.2 % — ABNORMAL LOW (ref 39.0–52.0)
Hemoglobin: 10.7 g/dL — ABNORMAL LOW (ref 13.0–17.0)
Immature Granulocytes: 1 %
Lymphocytes Relative: 47 %
Lymphs Abs: 1 K/uL (ref 0.7–4.0)
MCH: 25.2 pg — ABNORMAL LOW (ref 26.0–34.0)
MCHC: 32.2 g/dL (ref 30.0–36.0)
MCV: 78.3 fL — ABNORMAL LOW (ref 80.0–100.0)
Monocytes Absolute: 0.6 K/uL (ref 0.1–1.0)
Monocytes Relative: 29 %
Neutro Abs: 0.4 K/uL — CL (ref 1.7–7.7)
Neutrophils Relative %: 21 %
Platelet Count: 28 K/uL — ABNORMAL LOW (ref 150–400)
RBC: 4.24 MIL/uL (ref 4.22–5.81)
RDW: 16.3 % — ABNORMAL HIGH (ref 11.5–15.5)
WBC Count: 2.1 K/uL — ABNORMAL LOW (ref 4.0–10.5)
nRBC: 0 % (ref 0.0–0.2)

## 2024-03-20 MED ORDER — EPOETIN ALFA 20000 UNIT/ML IJ SOLN
20000.0000 [IU] | Freq: Once | INTRAMUSCULAR | Status: AC
  Administered 2024-03-20: 20000 [IU] via SUBCUTANEOUS
  Filled 2024-03-20: qty 1

## 2024-03-20 NOTE — Telephone Encounter (Signed)
 CRITICAL VALUE STICKER  CRITICAL VALUE: ANC 0.4  RECEIVER (on-site recipient of call): Sherrilyn Sobers, LPN  DATE & TIME NOTIFIED: 09:40  03/20/24  MESSENGER (representative from lab): Eleanor  MD NOTIFIED: Johnston Police, PA  TIME OF NOTIFICATION:09:42  RESPONSE:

## 2024-04-02 ENCOUNTER — Encounter: Payer: Self-pay | Admitting: Pharmacist

## 2024-04-02 DIAGNOSIS — H353112 Nonexudative age-related macular degeneration, right eye, intermediate dry stage: Secondary | ICD-10-CM | POA: Diagnosis not present

## 2024-04-02 DIAGNOSIS — H43812 Vitreous degeneration, left eye: Secondary | ICD-10-CM | POA: Diagnosis not present

## 2024-04-02 DIAGNOSIS — H401131 Primary open-angle glaucoma, bilateral, mild stage: Secondary | ICD-10-CM | POA: Diagnosis not present

## 2024-04-02 DIAGNOSIS — H353221 Exudative age-related macular degeneration, left eye, with active choroidal neovascularization: Secondary | ICD-10-CM | POA: Diagnosis not present

## 2024-04-02 NOTE — Progress Notes (Signed)
 Pharmacy Quality Measure Review  This patient is appearing on a report for being at risk of failing the adherence measure for cholesterol (statin) medications this calendar year.   Medication: Pravastatin  20 mg Last fill date: 01/25/24 for 90 day supply  Insurance report was not up to date. No action needed at this time.   Darrelyn Drum, PharmD, BCPS, CPP Clinical Pharmacist Practitioner [facility] [phone]

## 2024-04-03 ENCOUNTER — Inpatient Hospital Stay: Attending: Physician Assistant

## 2024-04-03 ENCOUNTER — Inpatient Hospital Stay

## 2024-04-03 VITALS — BP 129/68 | HR 76 | Temp 98.8°F | Resp 17

## 2024-04-03 DIAGNOSIS — Z79899 Other long term (current) drug therapy: Secondary | ICD-10-CM | POA: Diagnosis not present

## 2024-04-03 DIAGNOSIS — D4620 Refractory anemia with excess of blasts, unspecified: Secondary | ICD-10-CM | POA: Insufficient documentation

## 2024-04-03 DIAGNOSIS — D469 Myelodysplastic syndrome, unspecified: Secondary | ICD-10-CM

## 2024-04-03 LAB — CMP (CANCER CENTER ONLY)
ALT: <5 U/L (ref 0–44)
AST: 18 U/L (ref 15–41)
Albumin: 4.4 g/dL (ref 3.5–5.0)
Alkaline Phosphatase: 60 U/L (ref 38–126)
Anion gap: 6 (ref 5–15)
BUN: 24 mg/dL — ABNORMAL HIGH (ref 8–23)
CO2: 26 mmol/L (ref 22–32)
Calcium: 9.6 mg/dL (ref 8.9–10.3)
Chloride: 108 mmol/L (ref 98–111)
Creatinine: 1.15 mg/dL (ref 0.61–1.24)
GFR, Estimated: >60 mL/min (ref >60)
Glucose, Bld: 109 mg/dL — ABNORMAL HIGH (ref 70–99)
Potassium: 4.1 mmol/L (ref 3.5–5.1)
Sodium: 140 mmol/L (ref 135–145)
Total Bilirubin: 0.7 mg/dL (ref 0.0–1.2)
Total Protein: 7.2 g/dL (ref 6.5–8.1)

## 2024-04-03 LAB — CBC WITH DIFFERENTIAL (CANCER CENTER ONLY)
Abs Immature Granulocytes: 0.02 K/uL (ref 0.00–0.07)
Basophils Absolute: 0 K/uL (ref 0.0–0.1)
Basophils Relative: 0 %
Eosinophils Absolute: 0 K/uL (ref 0.0–0.5)
Eosinophils Relative: 1 %
HCT: 32.4 % — ABNORMAL LOW (ref 39.0–52.0)
Hemoglobin: 10.3 g/dL — ABNORMAL LOW (ref 13.0–17.0)
Immature Granulocytes: 1 %
Lymphocytes Relative: 31 %
Lymphs Abs: 0.7 K/uL (ref 0.7–4.0)
MCH: 24.7 pg — ABNORMAL LOW (ref 26.0–34.0)
MCHC: 31.8 g/dL (ref 30.0–36.0)
MCV: 77.7 fL — ABNORMAL LOW (ref 80.0–100.0)
Monocytes Absolute: 0.7 K/uL (ref 0.1–1.0)
Monocytes Relative: 30 %
Neutro Abs: 0.8 K/uL — ABNORMAL LOW (ref 1.7–7.7)
Neutrophils Relative %: 37 %
Platelet Count: 23 K/uL — ABNORMAL LOW (ref 150–400)
RBC: 4.17 MIL/uL — ABNORMAL LOW (ref 4.22–5.81)
RDW: 15.9 % — ABNORMAL HIGH (ref 11.5–15.5)
WBC Count: 2.2 K/uL — ABNORMAL LOW (ref 4.0–10.5)
nRBC: 0 % (ref 0.0–0.2)

## 2024-04-03 LAB — SAMPLE TO BLOOD BANK

## 2024-04-03 MED ORDER — EPOETIN ALFA 20000 UNIT/ML IJ SOLN
20000.0000 [IU] | Freq: Once | INTRAMUSCULAR | Status: AC
  Administered 2024-04-03: 20000 [IU] via SUBCUTANEOUS
  Filled 2024-04-03: qty 1

## 2024-04-04 ENCOUNTER — Ambulatory Visit (INDEPENDENT_AMBULATORY_CARE_PROVIDER_SITE_OTHER): Admitting: Infectious Diseases

## 2024-04-04 ENCOUNTER — Other Ambulatory Visit: Payer: Self-pay

## 2024-04-04 ENCOUNTER — Encounter: Payer: Self-pay | Admitting: Infectious Diseases

## 2024-04-04 VITALS — BP 149/73 | HR 60 | Temp 97.5°F | Wt 156.0 lb

## 2024-04-04 DIAGNOSIS — A4901 Methicillin susceptible Staphylococcus aureus infection, unspecified site: Secondary | ICD-10-CM | POA: Diagnosis not present

## 2024-04-04 DIAGNOSIS — M25422 Effusion, left elbow: Secondary | ICD-10-CM | POA: Insufficient documentation

## 2024-04-04 NOTE — Progress Notes (Signed)
 Patient Active Problem List   Diagnosis Date Noted   Bursitis of left elbow 02/13/2024   Cellulitis and abscess of left leg 02/08/2024   Cellulitis of left leg 02/07/2024   Intertrigo 12/16/2023   MSSA (methicillin susceptible Staphylococcus aureus) infection 12/11/2023   Swelling of lower leg 11/17/2023   PICC (peripherally inserted central catheter) in place 11/17/2023   Medication management 11/17/2023   Erysipelas of both lower extremities 11/12/2023   Urinary incontinence 11/12/2023   Septic bursitis of elbow 11/06/2023   Abscess of epidural space of spine due to bacteria 10/25/2023   Epidural abscess 10/25/2023   MSSA bacteremia 10/22/2023   Hypokalemia 10/21/2023   Acute on chronic pain of left elbow concerning for septic arthritis 10/21/2023   Spinal stenosis of lumbar region 10/16/2023   Pseudophakia of both eyes 02/15/2022   MDS (myelodysplastic syndrome) (HCC) 12/25/2021   Abnormal EKG 11/18/2021   Thrombocytopenia 11/18/2021   Lumbar spondylosis 07/22/2021   Lumbar radiculopathy 03/24/2021   S/P total knee arthroplasty, right 11/09/2020   Weight loss 08/30/2020   Complication associated with orthopedic device 06/10/2020   Diastolic dysfunction 10/27/2019   Exudative age-related macular degeneration of left eye with active choroidal neovascularization (HCC) 10/15/2019   Intermediate stage nonexudative age-related macular degeneration of left eye 10/15/2019   Early stage nonexudative age-related macular degeneration of right eye 10/15/2019   Pseudophakia 10/15/2019   Posterior vitreous detachment of left eye 10/15/2019   GERD (gastroesophageal reflux disease) 11/13/2018   Coronary artery disease 10/24/2018   Claudication 02/22/2018   Anemia 08/28/2017   DOE (dyspnea on exertion) 07/31/2017   Fatigue 07/31/2017   Essential hypertension 11/29/2016   Smoking greater than 30 pack years 02/02/2016   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) 02/28/2013   Ingrowing toenail of  left foot 08/29/2012   Allergic rhinitis 10/10/2011   Dupuytren's contracture of hand 07/05/2011   Vitamin B12 deficiency 12/23/2010   BPH (benign prostatic hyperplasia) 12/23/2010   Low back pain 07/27/2009   TOBACCO USE, QUIT 07/27/2009   Diverticulosis of colon 03/28/2009   RLS (restless legs syndrome) 10/28/2008   SKIN RASH, ALLERGIC 10/01/2008   Edema 10/01/2008   Neuropathy 04/22/2008   Vitamin D  deficiency 01/21/2008   INSOMNIA, CHRONIC 12/23/2007   Osteoarthritis 12/23/2007   CAROTID BRUIT, LEFT 12/23/2007   Current Outpatient Medications on File Prior to Visit  Medication Sig Dispense Refill   acetaminophen  (TYLENOL ) 500 MG tablet Take 1,000 mg by mouth every 6 (six) hours as needed for mild pain (pain score 1-3) or moderate pain (pain score 4-6).     amLODipine  (NORVASC ) 5 MG tablet TAKE 1 TABLET BY MOUTH DAILY 90 tablet 3   calcium  carbonate (TUMS EX) 750 MG chewable tablet Chew 750-1,500 mg by mouth 4 (four) times daily as needed for heartburn.     carbidopa -levodopa  (SINEMET  IR) 25-100 MG tablet For breakthrough restless leg, you can take 1 tablet at bedtime.  OK to take extra tab as needed during the day. 100 tablet 3   Cholecalciferol  1000 UNITS tablet Take 1,000 Units by mouth in the morning, at noon, and at bedtime.     Cyanocobalamin  (VITAMIN B-12) 1000 MCG SUBL DISSOLVE 2 TABLETS IN MOUTH EVERY DAY 180 tablet 1   finasteride  (PROSCAR ) 5 MG tablet TAKE 1 TABLET (5 MG TOTAL) BY MOUTH DAILY. 90 tablet 1   furosemide  (LASIX ) 20 MG tablet Take 1 tablet (20 mg total) by mouth as needed for edema or fluid. FOR 3 POUND WEIGHT  GAIN OVER NIGHT AND 5 POUND WEIGHT GAIN IN A WEEK 90 tablet 1   gabapentin  (NEURONTIN ) 300 MG capsule Take 1 capsule (300 mg total) by mouth 3 (three) times daily.     ibuprofen (ADVIL) 200 MG tablet Take 400 mg by mouth every 6 (six) hours as needed for mild pain (pain score 1-3) or moderate pain (pain score 4-6).     magnesium  hydroxide (MILK OF  MAGNESIA) 400 MG/5ML suspension Take 15 mLs by mouth daily as needed for mild constipation.     Multiple Vitamins-Minerals (PRESERVISION AREDS PO) Take 1 tablet by mouth 2 (two) times daily.     ondansetron  (ZOFRAN ) 4 MG tablet Take 1 tablet (4 mg total) by mouth every 6 (six) hours as needed for nausea or vomiting. 30 tablet 0   Oxycodone  HCl 10 MG TABS Take 1 tablet (10 mg total) by mouth 5 (five) times daily for 5 days. 25 tablet 0   Polyethyl Glycol-Propyl Glycol (SYSTANE OP) Place 1 drop into both eyes 2 (two) times daily as needed (dry eyes). [facility]     pravastatin  (PRAVACHOL ) 20 MG tablet TAKE 1 TABLET (20 MG TOTAL) BY MOUTH DAILY. 90 tablet 1   rOPINIRole  (REQUIP ) 2 MG tablet TAKE 1 TABLET BY MOUTH AT AT NOON AND 1 TAB AT 4PM AND 1 TAB AT BEDTIME 270 tablet 1   tamsulosin  (FLOMAX ) 0.4 MG CAPS capsule TAKE 1 CAPSULE (0.4 MG TOTAL) BY MOUTH IN THE MORNING. 90 capsule 1   terazosin  (HYTRIN ) 2 MG capsule TAKE 1 CAPSULE BY MOUTH EVERY NIGHT AT BEDTIME 90 capsule 1   No current facility-administered medications on file prior to visit.   Subjective: Discussed the use of AI scribe software for clinical note transcription with the patient, who gave verbal consent to proceed.   83 year old male with prior history of HTN, CAD, MDS with thrombocytopenia, depression BCC, BPH, melanoma, GERD, DDD, OA, TIA, h/o Tobacco/alcohol  abuse who is here for HFU 4/20-4/25 for left elbow septic bursitis and lumbar epidural abscess.  Patient had an open fracture of his left elbow in 2007. Approximately 4 years ago one of the pins came loose and it was taken out and he had limited range of motion in his left elbow with inability to fully flex and extend and unable to function with his left hand. Left Elbow x-ray as below. 4/20 left elbow SF Gram stain with GPC. Cx with MSSA.  4/20 blood culture with MSSA.  4/22 underwent I&D with removal of hardware, 1 screw remaining ( no OP note available). OR cx with MSSA and MSSE.  TTE and TEE negative for vegetations or endocarditis.  MRI L spine septic arthritis of left L5-S1.  Left dorsal epidural abscess extending from L4-S1, phlegmonous change in the left psoas.  No surgical intervention per neurosurgery.  Also required PRBC transfusion for anemia. Discharged on 4/25 to complete 8 weeks course of IV cefazolin  through 6/17  5/15 Patient is accompanied by his daughter surgery.  Getting IV antibiotics through PICC without any concerns related to antibiotics or PICC.  Cefazolin  was recently reduced to 1 g every 12 dosing due to elevated kidney function.  Daughter reports increased swelling and redness in his left leg compared to right leg for the last few days history of chronic intermittent swelling of bilateral legs.  Seen by PCP on Monday and was started on a 7-day course of doxycycline .  Patient is also on diuretic. Saw Dr. Celena yesterday and discharged from  his care as left elbow has healed. Also saw Dr Mavis post discharge and, wants to repeat MRI end of month to see if improvement and needs for +/- surgery.  6/10 Accompanied by daughter. Getting IV cefazolin  through PICC without any concerns. Left elbow wound has healed. He has experienced significant improvement in mobility since the last visit three weeks ago. He is now able to walk to the bathroom and bed with the aid of a walker and can walk to the car instead of using a wheelchair. However, he continues to use a wheelchair for longer distances due to balance issues.  He experiences significant back pain, particularly when lying down, rating it close to ten on a scale of one to ten. The pain is alleviated when sitting. He is under pain management with oxycodone , which he takes as needed, usually less than the prescribed five times a day. He is scheduled for an MRI 6/11.  Next fu with Dr Mavis on July 1. Has one more fu with Dr Celena. Seen by PCP on 6/3, leg swelling has improved since last visit and has stopped lasix   recently due to need to frequently urinate at night.   8/4 He was seen by Dr Overton last couple of times. 6/23 visit, cefadroxil  changed to PO linezolid  for 7 days due to concerns for infection in left elbow then switched back to PO cefadroxil  which he has been taking till date without concerns.   Follows pain specialist Dr Bonner for back pain, back pain is 2-3/10 at baseline and 7/10 when he walks around, lies at back. Was prescribed prednisone  taper last Friday from pain clinic and has 2 more days left, can't tell if made a difference. Discharged by Neurosurgery.   Left elbow with no concerns, and Dr Celena had no concerns as well per daughter.   9/12 Taking p.o. cefadroxil  2 tablets twice daily without missing doses or concerns.  Reports being seen by Dr. Celena 3 weeks ago and got aspiration of left elbow done which was bloody and no growth in cultures.  No concerns in left elbow although does not have full mobility of left elbow, which is chronic per daughter.  Last seen by PCP on 9/9. No complaints otherwise.   10/3 Accompanied by daughter. Not taking antibiotics. No concerns in left elbow except same old swelling at the left elbow likely bursa but no erythema, warmth or tenderness. No fevers and chills. No concerns in legs. No other complaints.   Review of Systems: All systems reviewed and negative except as above.    Past Medical History:  Diagnosis Date   Alcoholism (HCC)    Dry 13 years   Basal cell carcinoma    Right Chest   BPH (benign prostatic hyperplasia)    Diverticulosis of colon    Dizzy spells    none recent   DJD (degenerative joint disease)    lower back   Elbow fracture, left 2009   Dr Celena   FRACTURE, [place], LEFT 12/23/2007   Qualifier: Diagnosis of   By: Georgian ROSALEA CHARM Lamar        GERD (gastroesophageal reflux disease)    Glaucoma    History of TIAs 1 yrs ago   Hyperkeratosis    LBP (low back pain)    Macular degeneration    left wet right dry sees dr elner    Melanoma [facility]) 2009   x3 Dr Amy [place]   Osteoarthritis    Restless leg syndrome  Thrombocytopenia 11/18/2021   Tinnitus    Tobacco abuse    Uses walker    Wears glasses    reading   Wears partial dentures    upper    Past Surgical History:  Procedure Laterality Date   CATARACT EXTRACTION Bilateral 2018   HARDWARE REMOVAL Left 07/09/2020   Procedure: Left elbow hardware removal;  Surgeon: Sharl Selinda Dover, MD;  Location: [facility];  Service: Orthopedics;  Laterality: Left;  60 mins   HARDWARE REMOVAL Left 10/23/2023   Procedure: REMOVAL, HARDWARE;  Surgeon: Celena Sharper, MD;  Location: [facility] OR;  Service: Orthopedics;  Laterality: Left;   IRRIGATION AND DEBRIDEMENT ELBOW Left 10/23/2023   Procedure: IRRIGATION AND DEBRIDEMENT ELBOW;  Surgeon: Celena Sharper, MD;  Location: [facility] OR;  Service: Orthopedics;  Laterality: Left;   mda     Dr. Elner wet injection   MELANOMA EXCISION  2009   x3   RECONSTRUCTION MEDIAL COLLATERAL LIGAMENT ELBOW W/ TENDON GRAFT  2009   Left, Dr Celena   TONSILLECTOMY  as child   TOTAL KNEE ARTHROPLASTY Right 11/09/2020   Procedure: TOTAL KNEE ARTHROPLASTY;  Surgeon: Ernie Cough, MD;  Location: [facility] ORS;  Service: Orthopedics;  Laterality: Right;   TRANSESOPHAGEAL ECHOCARDIOGRAM (CATH LAB) N/A 10/24/2023   Procedure: TRANSESOPHAGEAL ECHOCARDIOGRAM;  Surgeon: Jeffrie Oneil BROCKS, MD;  Location: [facility] INVASIVE CV LAB;  Service: Cardiovascular;  Laterality: N/A;    Social History   Tobacco Use   Smoking status: Former    Current packs/day: 0.00    Average packs/day: 3.0 packs/day for 38.0 years (114.0 ttl pk-yrs)    Types: Cigarettes    Start date: 39    Quit date: 10    Years since quitting: 39.7   Smokeless tobacco: Never   Tobacco comments:    states he quit May 15 1985  Vaping Use   Vaping status: Never Used  Substance Use Topics   Alcohol  use: No    Comment: PREVIOUS - DRY 80yrs   Drug use: Not Currently    Family History   Problem Relation Age of Onset   Cancer Mother        ?breast   Cancer Father        Lung    Allergies  Allergen Reactions   Levofloxacin Other (See Comments)    REACTION: hands pealed    Health Maintenance  Topic Date Due   COVID-19 Vaccine (4 - 2025-26 season) 03/03/2024   Medicare Annual Wellness (AWV)  07/19/2024   DTaP/Tdap/Td (3 - Td or Tdap) 05/24/2032   Pneumococcal Vaccine: 50+ Years  Completed   Influenza Vaccine  Completed   HPV VACCINES  Aged Out   Meningococcal B Vaccine  Aged Out   Zoster Vaccines- Shingrix  Discontinued   Objective: BP (!) 149/73   Pulse 60   Temp (!) 97.5 F (36.4 C) (Oral)   Wt 156 lb (70.8 kg)   SpO2 100%   BMI 23.04 kg/m '  Physical Exam Constitutional:      Appearance: Normal appearance.  HENT:     Head: Normocephalic and atraumatic.      Mouth: Mucous membranes are moist.  Eyes:    Conjunctiva/sclera: Conjunctivae normal.     Pupils: Pupils are equal, round, and b/l symmetrical    Cardiovascular:     Rate and Rhythm: Normal rate and regular rhythm.     Heart sounds:  Pulmonary:     Effort: Pulmonary effort is normal.     Breath  sounds:  Abdominal:     General: Non distended     Palpations:   Musculoskeletal:        General: ambulatory.  Left elbow wound has healed. Some swelling but no warmth, tenderness, erythema, no signs of infection. ROM of left elbow is limited.   Mild swelling in the lower extremities s/l chronic venous insufficiency but nosigns of infection  Skin:    General: Skin is warm and dry.     Comments:   Neurological:     General: grossly non focal     Mental Status: awake, alert and oriented to person, place, and time.   Psychiatric:        Mood and Affect: Mood normal.   Lab Results Lab Results  Component Value Date   WBC 2.2 (L) 04/03/2024   HGB 10.3 (L) 04/03/2024   HCT 32.4 (L) 04/03/2024   MCV 77.7 (L) 04/03/2024   PLT 23 (L) 04/03/2024    Lab Results  Component Value Date    CREATININE 1.15 04/03/2024   BUN 24 (H) 04/03/2024   NA 140 04/03/2024   K 4.1 04/03/2024   CL 108 04/03/2024   CO2 26 04/03/2024    Lab Results  Component Value Date   ALT <5 04/03/2024   AST 18 04/03/2024   ALKPHOS 60 04/03/2024   BILITOT 0.7 04/03/2024    Lab Results  Component Value Date   CHOL 136 01/12/2017   HDL 54.70 01/12/2017   LDLCALC 68 01/12/2017   TRIG 67.0 01/12/2017   CHOLHDL 2 01/12/2017   No results found for: LABRPR, RPRTITER No results found for: HIV1RNAQUANT, HIV1RNAVL, CD4TABS   Microbiology Results for orders placed or performed in visit on 02/07/24  Culture, blood (Routine X 2) w Reflex to ID Panel     Status: None   Collection Time: 02/07/24  2:32 PM   Specimen: BLOOD RIGHT ARM  Result Value Ref Range Status   Specimen Description BLOOD RIGHT ARM  Final   Special Requests   Final    BOTTLES DRAWN AEROBIC AND ANAEROBIC Blood Culture results may not be optimal due to an inadequate volume of blood received in culture bottles   Culture   Final    NO GROWTH 5 DAYS Performed at [facility], [address], [place], [place] [address]    Report Status 02/12/2024 FINAL  Final  Culture, blood (Routine X 2) w Reflex to ID Panel     Status: None   Collection Time: 02/07/24  2:33 PM   Specimen: BLOOD RIGHT HAND  Result Value Ref Range Status   Specimen Description BLOOD RIGHT HAND  Final   Special Requests   Final    BOTTLES DRAWN AEROBIC AND ANAEROBIC Blood Culture results may not be optimal due to an inadequate volume of blood received in culture bottles   Culture   Final    NO GROWTH 5 DAYS Performed at [facility], [address], [place], [place] [address]    Report Status 02/12/2024 FINAL  Final   Imaging        Assessment/Plan # Complicated MSSA bacteremia # Left elbow septic  bursitis # Lumbar epidural abscess  - 4/22 blood cx cleared  - 4/15 TTE and 4/23 TEE negative for endocarditis  - 4/22 s/p I and D with  hardware removal with one screw remaining. Or Cx MSSA and MSSE - no surgical intervention per neurosurgery - S/p IV cefazolin  through 6/17 > PO cefadroxil > linezolid   for a week during  OV with Dr Overton on 6/23 for concerns of Left elbow followed by PO cefadroxil  through 9/12.  - 10/3 left elbow with no concerns   Plan  - fu as needed.   # Chronic back pain - follows pain clinic and pain management per them  # Medication Management  - 10/2 CBC and CMP reviewed and discussed, ANC improved to 0.8, plts at 23 - follows Oncology as below.   # MDS w thrombocytopenia/leukopenia  - on retacrit  every 2 weeks  - Follows Oncology, last seen 7/24  I spent 20 minutes involved in face-to-face and non-face-to-face activities for this patient on the day of the visit. Professional time spent includes the following activities: Preparing to see the patient (review of tests), Obtaining and reviewing separately obtained history ( last note), Performing a medically appropriate examination and evaluation, Documenting clinical information in the EMR, Independently interpreting results (not separately reported), Communicating results to the patient/daughter, Counseling and educating the patient/daughter and Care coordination (not separately reported).   Of note, portions of this note may have been created with voice recognition software. While this note has been edited for accuracy, occasional wrong-word or 'sound-a-like' substitutions may have occurred due to the inherent limitations of voice recognition software.   Annalee Joseph, MD [facility] 04/04/2024, 10:47 AM

## 2024-04-09 DIAGNOSIS — D692 Other nonthrombocytopenic purpura: Secondary | ICD-10-CM | POA: Diagnosis not present

## 2024-04-09 DIAGNOSIS — Z85828 Personal history of other malignant neoplasm of skin: Secondary | ICD-10-CM | POA: Diagnosis not present

## 2024-04-09 DIAGNOSIS — L570 Actinic keratosis: Secondary | ICD-10-CM | POA: Diagnosis not present

## 2024-04-09 DIAGNOSIS — D2371 Other benign neoplasm of skin of right lower limb, including hip: Secondary | ICD-10-CM | POA: Diagnosis not present

## 2024-04-09 DIAGNOSIS — D225 Melanocytic nevi of trunk: Secondary | ICD-10-CM | POA: Diagnosis not present

## 2024-04-09 DIAGNOSIS — I8311 Varicose veins of right lower extremity with inflammation: Secondary | ICD-10-CM | POA: Diagnosis not present

## 2024-04-09 DIAGNOSIS — I872 Venous insufficiency (chronic) (peripheral): Secondary | ICD-10-CM | POA: Diagnosis not present

## 2024-04-09 DIAGNOSIS — I8312 Varicose veins of left lower extremity with inflammation: Secondary | ICD-10-CM | POA: Diagnosis not present

## 2024-04-16 DIAGNOSIS — M48062 Spinal stenosis, lumbar region with neurogenic claudication: Secondary | ICD-10-CM | POA: Diagnosis not present

## 2024-04-16 DIAGNOSIS — M5416 Radiculopathy, lumbar region: Secondary | ICD-10-CM | POA: Diagnosis not present

## 2024-04-17 ENCOUNTER — Inpatient Hospital Stay: Admitting: Hematology and Oncology

## 2024-04-17 ENCOUNTER — Inpatient Hospital Stay

## 2024-04-17 VITALS — BP 132/72 | HR 64 | Temp 97.5°F | Resp 14 | Wt 152.5 lb

## 2024-04-17 DIAGNOSIS — D469 Myelodysplastic syndrome, unspecified: Secondary | ICD-10-CM

## 2024-04-17 DIAGNOSIS — D4620 Refractory anemia with excess of blasts, unspecified: Secondary | ICD-10-CM | POA: Diagnosis not present

## 2024-04-17 DIAGNOSIS — D61818 Other pancytopenia: Secondary | ICD-10-CM

## 2024-04-17 LAB — CBC WITH DIFFERENTIAL (CANCER CENTER ONLY)
Abs Immature Granulocytes: 0.02 K/uL (ref 0.00–0.07)
Basophils Absolute: 0 K/uL (ref 0.0–0.1)
Basophils Relative: 1 %
Eosinophils Absolute: 0 K/uL (ref 0.0–0.5)
Eosinophils Relative: 1 %
HCT: 33.3 % — ABNORMAL LOW (ref 39.0–52.0)
Hemoglobin: 10.5 g/dL — ABNORMAL LOW (ref 13.0–17.0)
Immature Granulocytes: 1 %
Lymphocytes Relative: 33 %
Lymphs Abs: 1 K/uL (ref 0.7–4.0)
MCH: 24.5 pg — ABNORMAL LOW (ref 26.0–34.0)
MCHC: 31.5 g/dL (ref 30.0–36.0)
MCV: 77.8 fL — ABNORMAL LOW (ref 80.0–100.0)
Monocytes Absolute: 0.9 K/uL (ref 0.1–1.0)
Monocytes Relative: 32 %
Neutro Abs: 0.9 K/uL — ABNORMAL LOW (ref 1.7–7.7)
Neutrophils Relative %: 32 %
Platelet Count: 23 K/uL — ABNORMAL LOW (ref 150–400)
RBC: 4.28 MIL/uL (ref 4.22–5.81)
RDW: 16 % — ABNORMAL HIGH (ref 11.5–15.5)
WBC Count: 2.8 K/uL — ABNORMAL LOW (ref 4.0–10.5)
nRBC: 0 % (ref 0.0–0.2)

## 2024-04-17 LAB — CMP (CANCER CENTER ONLY)
ALT: 5 U/L (ref 0–44)
AST: 19 U/L (ref 15–41)
Albumin: 4.4 g/dL (ref 3.5–5.0)
Alkaline Phosphatase: 62 U/L (ref 38–126)
Anion gap: 6 (ref 5–15)
BUN: 24 mg/dL — ABNORMAL HIGH (ref 8–23)
CO2: 28 mmol/L (ref 22–32)
Calcium: 9.7 mg/dL (ref 8.9–10.3)
Chloride: 108 mmol/L (ref 98–111)
Creatinine: 1.2 mg/dL (ref 0.61–1.24)
GFR, Estimated: 60 mL/min — ABNORMAL LOW (ref >60)
Glucose, Bld: 101 mg/dL — ABNORMAL HIGH (ref 70–99)
Potassium: 3.9 mmol/L (ref 3.5–5.1)
Sodium: 142 mmol/L (ref 135–145)
Total Bilirubin: 0.7 mg/dL (ref 0.0–1.2)
Total Protein: 7.1 g/dL (ref 6.5–8.1)

## 2024-04-17 LAB — SAMPLE TO BLOOD BANK

## 2024-04-17 MED ORDER — EPOETIN ALFA 20000 UNIT/ML IJ SOLN
20000.0000 [IU] | Freq: Once | INTRAMUSCULAR | Status: AC
  Administered 2024-04-17: 20000 [IU] via SUBCUTANEOUS
  Filled 2024-04-17: qty 1

## 2024-04-17 NOTE — Progress Notes (Signed)
 [facility] [phone]   [phone]  PROGRESS NOTE  Patient Care Team: Plotnikov, Karlynn GAILS, MD as PCP - General Loni Soyla LABOR, MD as PCP - Cardiology (Cardiology) Rollin Dover, MD as Consulting Physician (Gastroenterology) Jordan, Amy, MD as Consulting Physician (Dermatology) Tobie Tonita POUR, DO as Consulting Physician (Neurology) Bonner Ade, MD as Consulting Physician (Physical Medicine and Rehabilitation) Ernie Cough, MD as Consulting Physician (Orthopedic Surgery) Szabat, Toribio BROCKS, [facility] (Inactive) as Pharmacist (Pharmacist) Federico Norleen ONEIDA MADISON, MD as Consulting Physician (Hematology and Oncology) Celena Sharper, MD as Consulting Physician (Orthopedic Surgery) Dea Shiner, MD as Consulting Physician (Infectious Diseases)  Hematological/Oncological History # Pancytopenia #Myelodysplastic Syndrome, Single Lineage Dysplasia 11/18/2021: WBC 2.4, ANC 1.1, Hgb 9.7, MCV 84.0, Plt 58. 12/07/2021: Establish care with Dr. Federico and [employer] 12/19/2021: Bone marrow biopsy which showed a low-grade myelodysplastic syndrome.  6/28/203: Started retacrit  20,000 units q 2 weeks  Interval History:  Justin [name]. 83 y.o. male with medical history significant for newly diagnosed low-grade myelodysplastic syndrome who presents for a follow up visit. The patient's last visit was on 01/24/2024. In the interim, he continues on retacrit  injections q 2 weeks.  He also had a hospitalization in August 2025 for cellulitis of the lower extremities.  On exam today Justin [name] is accompanied by his daughter.  He reports he has recovered well from his episode of cellulitis but still has some discoloration on his leg.  He spent 2 nights in the hospital and did have a PICC line with antibiotics given orally later.  He reports his energy levels are not as good as he would like.  He reports he has lost some weight and is down to 1 to 52 pounds, down 5 pounds  from September.  He reports his appetite is poor and he cannot find the right food.  He notes he continues to follow with dermatology where they remove skin lesions periodically.  He reports he is had no bleeding but does have a lot of bruising.  He does currently have a knot on his elbow which has not changed.  He reports he is not having any lightheadedness, dizziness, or shortness of breath.  A full 10 point ROS is otherwise negative.  MEDICAL HISTORY:  Past Medical History:  Diagnosis Date   Alcoholism (HCC)    Dry 13 years   Basal cell carcinoma    Right Chest   BPH (benign prostatic hyperplasia)    Diverticulosis of colon    Dizzy spells    none recent   DJD (degenerative joint disease)    lower back   Elbow fracture, left 2009   Dr Celena   FRACTURE, [place], LEFT 12/23/2007   Qualifier: Diagnosis of   By: Georgian ROSALEA CHARM Lamar        GERD (gastroesophageal reflux disease)    Glaucoma    History of TIAs 1 yrs ago   Hyperkeratosis    LBP (low back pain)    Macular degeneration    left wet right dry sees dr elner   Melanoma [facility]) 2009   x3 Dr Amy Jordan   Osteoarthritis    Restless leg syndrome    Thrombocytopenia 11/18/2021   Tinnitus    Tobacco abuse    Uses walker    Wears glasses    reading   Wears partial dentures    upper    SURGICAL HISTORY: Past Surgical History:  Procedure Laterality Date   CATARACT EXTRACTION Bilateral 2018  HARDWARE REMOVAL Left 07/09/2020   Procedure: Left elbow hardware removal;  Surgeon: Sharl Selinda Dover, MD;  Location: [facility];  Service: Orthopedics;  Laterality: Left;  60 mins   HARDWARE REMOVAL Left 10/23/2023   Procedure: REMOVAL, HARDWARE;  Surgeon: Celena Sharper, MD;  Location: [facility] OR;  Service: Orthopedics;  Laterality: Left;   IRRIGATION AND DEBRIDEMENT ELBOW Left 10/23/2023   Procedure: IRRIGATION AND DEBRIDEMENT ELBOW;  Surgeon: Celena Sharper, MD;  Location: [facility] OR;  Service: Orthopedics;  Laterality: Left;    mda     Dr. Elner wet injection   MELANOMA EXCISION  2009   x3   RECONSTRUCTION MEDIAL COLLATERAL LIGAMENT ELBOW W/ TENDON GRAFT  2009   Left, Dr Celena   TONSILLECTOMY  as child   TOTAL KNEE ARTHROPLASTY Right 11/09/2020   Procedure: TOTAL KNEE ARTHROPLASTY;  Surgeon: Ernie Cough, MD;  Location: [facility] ORS;  Service: Orthopedics;  Laterality: Right;   TRANSESOPHAGEAL ECHOCARDIOGRAM (CATH LAB) N/A 10/24/2023   Procedure: TRANSESOPHAGEAL ECHOCARDIOGRAM;  Surgeon: Jeffrie Oneil BROCKS, MD;  Location: [facility] INVASIVE CV LAB;  Service: Cardiovascular;  Laterality: N/A;    SOCIAL HISTORY: Social History   Socioeconomic History   Marital status: Married    Spouse name: Rollene Caldron   Number of children: 2   Years of education: Not on file   Highest education level: Not on file  Occupational History   Occupation: [employer] - [employer]    Employer: RETIRED   Occupation: Scientist, Research (physical Sciences)   Occupation: Games Developer  Tobacco Use   Smoking status: Former    Current packs/day: 0.00    Average packs/day: 3.0 packs/day for 38.0 years (114.0 ttl pk-yrs)    Types: Cigarettes    Start date: 10    Quit date: 1986    Years since quitting: 39.8   Smokeless tobacco: Never   Tobacco comments:    states he quit May 15 1985  Vaping Use   Vaping status: Never Used  Substance and Sexual Activity   Alcohol  use: No    Comment: PREVIOUS - DRY 95yrs   Drug use: Not Currently   Sexual activity: Not Currently  Other Topics Concern   Not on file  Social History Narrative   Right handed   One story home   Drinks coffee q am      Son and wife lives with him-2025   Social Drivers of Health   Financial Resource Strain: Low Risk  (07/20/2023)   Overall Financial Resource Strain (CARDIA)    Difficulty of Paying Living Expenses: Not very hard  Food Insecurity: No Food Insecurity (02/11/2024)   Hunger Vital Sign    Worried About Running Out of Food in the Last Year: Never true    Ran Out of  Food in the Last Year: Never true  Transportation Needs: No Transportation Needs (02/11/2024)   PRAPARE - Administrator, Civil Service (Medical): No    Lack of Transportation (Non-Medical): No  Physical Activity: Inactive (07/20/2023)   Exercise Vital Sign    Days of Exercise per Week: 0 days    Minutes of Exercise per Session: 0 min  Stress: No Stress Concern Present (07/20/2023)   [employer] of Occupational Health - Occupational Stress Questionnaire    Feeling of Stress : Not at all  Social Connections: Moderately Isolated (02/07/2024)   Social Connection and Isolation Panel    Frequency of Communication with Friends and Family: More than three times a week  Frequency of Social Gatherings with Friends and Family: More than three times a week    Attends Religious Services: Never    Database Administrator or Organizations: No    Attends Banker Meetings: Never    Marital Status: Married  Catering Manager Violence: Not At Risk (02/11/2024)   Humiliation, Afraid, Rape, and Kick questionnaire    Fear of Current or Ex-Partner: No    Emotionally Abused: No    Physically Abused: No    Sexually Abused: No    FAMILY HISTORY: Family History  Problem Relation Age of Onset   Cancer Mother        ?breast   Cancer Father        Lung    ALLERGIES:  is allergic to levofloxacin.  MEDICATIONS:  Current Outpatient Medications  Medication Sig Dispense Refill   acetaminophen  (TYLENOL ) 500 MG tablet Take 1,000 mg by mouth every 6 (six) hours as needed for mild pain (pain score 1-3) or moderate pain (pain score 4-6).     amLODipine  (NORVASC ) 5 MG tablet TAKE 1 TABLET BY MOUTH DAILY 90 tablet 3   calcium  carbonate (TUMS EX) 750 MG chewable tablet Chew 750-1,500 mg by mouth 4 (four) times daily as needed for heartburn.     carbidopa -levodopa  (SINEMET  IR) 25-100 MG tablet For breakthrough restless leg, you can take 1 tablet at bedtime.  OK to take extra tab as needed  during the day. 100 tablet 3   Cholecalciferol  1000 UNITS tablet Take 1,000 Units by mouth in the morning, at noon, and at bedtime.     Cyanocobalamin  (VITAMIN B-12) 1000 MCG SUBL DISSOLVE 2 TABLETS IN MOUTH EVERY DAY 180 tablet 1   finasteride  (PROSCAR ) 5 MG tablet TAKE 1 TABLET (5 MG TOTAL) BY MOUTH DAILY. 90 tablet 1   furosemide  (LASIX ) 20 MG tablet Take 1 tablet (20 mg total) by mouth as needed for edema or fluid. FOR 3 POUND WEIGHT GAIN OVER NIGHT AND 5 POUND WEIGHT GAIN IN A WEEK 90 tablet 1   gabapentin  (NEURONTIN ) 300 MG capsule Take 1 capsule (300 mg total) by mouth 3 (three) times daily.     ibuprofen (ADVIL) 200 MG tablet Take 400 mg by mouth every 6 (six) hours as needed for mild pain (pain score 1-3) or moderate pain (pain score 4-6).     magnesium  hydroxide (MILK OF MAGNESIA) 400 MG/5ML suspension Take 15 mLs by mouth daily as needed for mild constipation.     Multiple Vitamins-Minerals (PRESERVISION AREDS PO) Take 1 tablet by mouth 2 (two) times daily.     ondansetron  (ZOFRAN ) 4 MG tablet Take 1 tablet (4 mg total) by mouth every 6 (six) hours as needed for nausea or vomiting. 30 tablet 0   Oxycodone  HCl 10 MG TABS Take 1 tablet (10 mg total) by mouth 5 (five) times daily for 5 days. 25 tablet 0   Polyethyl Glycol-Propyl Glycol (SYSTANE OP) Place 1 drop into both eyes 2 (two) times daily as needed (dry eyes). [facility] OTC     pravastatin  (PRAVACHOL ) 20 MG tablet TAKE 1 TABLET (20 MG TOTAL) BY MOUTH DAILY. 90 tablet 1   rOPINIRole  (REQUIP ) 2 MG tablet TAKE 1 TABLET BY MOUTH AT AT NOON AND 1 TAB AT 4PM AND 1 TAB AT BEDTIME 270 tablet 1   tamsulosin  (FLOMAX ) 0.4 MG CAPS capsule TAKE 1 CAPSULE (0.4 MG TOTAL) BY MOUTH IN THE MORNING. 90 capsule 1   terazosin  (HYTRIN ) 2 MG capsule TAKE 1  CAPSULE BY MOUTH EVERY NIGHT AT BEDTIME 90 capsule 1   No current facility-administered medications for this visit.    REVIEW OF SYSTEMS:   Constitutional: ( - ) fevers, ( - )  chills , ( - ) night  sweats Eyes: ( - ) blurriness of vision, ( - ) double vision, ( - ) watery eyes Ears, nose, mouth, throat, and face: ( - ) mucositis, ( - ) sore throat Respiratory: ( - ) cough, ( - ) dyspnea, ( - ) wheezes Cardiovascular: ( - ) palpitation, ( - ) chest discomfort, ( - ) lower extremity swelling Gastrointestinal:  ( - ) nausea, ( - ) heartburn, ( - ) change in bowel habits Skin: ( - ) abnormal skin rashes Lymphatics: ( - ) new lymphadenopathy, ( - ) easy bruising Neurological: ( - ) numbness, ( - ) tingling, ( - ) new weaknesses Behavioral/Psych: ( - ) mood change, ( - ) new changes  All other systems were reviewed with the patient and are negative.  PHYSICAL EXAMINATION:  Vitals:   04/17/24 0950  BP: 132/72  Pulse: 64  Resp: 14  Temp: (!) 97.5 F (36.4 C)  SpO2: 98%   Filed Weights   04/17/24 0950  Weight: 152 lb 8 oz (69.2 kg)    GENERAL: Well-appearing elderly Caucasian male, alert, no distress and comfortable SKIN: skin color, texture, turgor are normal, no rashes or significant lesions EYES: conjunctiva are pink and non-injected, sclera clear LUNGS: clear to auscultation and percussion with normal breathing effort HEART: regular rate & rhythm and no murmurs and no lower extremity edema Musculoskeletal: no cyanosis of digits and no clubbing  PSYCH: alert & oriented x 3, fluent speech NEURO: no focal motor/sensory deficits  LABORATORY DATA:  I have reviewed the data as listed    Latest Ref Rng & Units 04/17/2024    9:08 AM 04/03/2024    9:30 AM 03/20/2024    9:15 AM  CBC  WBC 4.0 - 10.5 K/uL 2.8  2.2  2.1   Hemoglobin 13.0 - 17.0 g/dL 89.4  89.6  89.2   Hematocrit 39.0 - 52.0 % 33.3  32.4  33.2   Platelets 150 - 400 K/uL 23  23  28         Latest Ref Rng & Units 04/17/2024    9:08 AM 04/03/2024    9:30 AM 03/20/2024    9:15 AM  CMP  Glucose 70 - 99 mg/dL 898  890  891   BUN 8 - 23 mg/dL 24  24  22    Creatinine 0.61 - 1.24 mg/dL 8.79  8.84  8.89   Sodium 135 -  145 mmol/L 142  140  140   Potassium 3.5 - 5.1 mmol/L 3.9  4.1  4.0   Chloride 98 - 111 mmol/L 108  108  109   CO2 22 - 32 mmol/L 28  26  25    Calcium  8.9 - 10.3 mg/dL 9.7  9.6  9.3   Total Protein 6.5 - 8.1 g/dL 7.1  7.2  7.2   Total Bilirubin 0.0 - 1.2 mg/dL 0.7  0.7  0.8   Alkaline Phos 38 - 126 U/L 62  60  64   AST 15 - 41 U/L 19  18  18    ALT 0 - 44 U/L 5  <5  6     Lab Results  Component Value Date   MPROTEIN Not Observed 12/07/2021   Lab Results  Component Value Date  KPAFRELGTCHN 45.3 (H) 12/07/2021   LAMBDASER 28.9 (H) 12/07/2021   KAPLAMBRATIO 1.57 12/07/2021   RADIOGRAPHIC STUDIES: No results found.   ASSESSMENT & PLAN Justin [name]. Is a 83 y.o. male who returns for a follow up for MDS.   # Myelodysplastic Syndrome, Single Lineage Dysplasia -- At this time Mr. Braley has a low-grade myelodysplasia that is anemia predominant. --Given that his hemoglobin is consistently less than 10 I would recommend we proceed with erythropoietin  therapy. Discussed with the patient other options including hypomethylating therapies and Luspatercept , however given that anemia is his primary issue would recommend erythropoietin  to see if this helps to elevate his other blood counts as well. --No clear signs of nutritional deficiency during our prior evaluation. --MDS FISH study was normal.  --Started retacrit  20,000 units q 2 weeks on 12/28/2021.  PLAN: --Due for retacrit  injection today. Currently on 20,000 units q 2 weeks.  --Labs from today reviewed.  White blood cell 2.8, hemoglobin 10.5, MCV 77.8, platelets 23 --Will continue to monitor as long as platelet count is >20K. Could consider Promacta vs Nplate injections if Plt levels drop consistently <20  --Continue with q 2 week retacrit  injections as scheduled --RTC in 12 weeks time to assure he is tolerating the shots with continued q 2 week injections/labs   No orders of the defined types were placed in this  encounter.   All questions were answered. The patient knows to call the clinic with any problems, questions or concerns.  A total of more than 30 minutes were spent on this encounter with face-to-face time and non-face-to-face time, including preparing to see the patient, ordering tests and/or medications, counseling the patient and coordination of care as outlined above.   Norleen IVAR Kidney, MD Department of Hematology/Oncology [facility] Phone: [phone] Pager: [phone] Email: [email or web address]  04/27/2024 7:37 PM

## 2024-04-27 ENCOUNTER — Encounter: Payer: Self-pay | Admitting: Hematology and Oncology

## 2024-04-28 ENCOUNTER — Ambulatory Visit: Admitting: Podiatry

## 2024-04-28 ENCOUNTER — Encounter: Payer: Self-pay | Admitting: Podiatry

## 2024-04-28 DIAGNOSIS — L03031 Cellulitis of right toe: Secondary | ICD-10-CM

## 2024-04-28 DIAGNOSIS — M79674 Pain in right toe(s): Secondary | ICD-10-CM | POA: Diagnosis not present

## 2024-04-28 DIAGNOSIS — M79675 Pain in left toe(s): Secondary | ICD-10-CM

## 2024-04-28 DIAGNOSIS — B351 Tinea unguium: Secondary | ICD-10-CM | POA: Diagnosis not present

## 2024-04-28 MED ORDER — DOXYCYCLINE HYCLATE 100 MG PO TABS: 100.0000 mg | ORAL_TABLET | Freq: Two times a day (BID) | ORAL | 0 refills | Status: DC

## 2024-04-28 MED ORDER — MUPIROCIN 2 % EX OINT: 1.0000 | TOPICAL_OINTMENT | Freq: Two times a day (BID) | CUTANEOUS | 2 refills | Status: AC

## 2024-04-28 NOTE — Patient Instructions (Signed)
 Soak Instructions- make sure the temperature of the water  is not too hot or too cold    THE DAY AFTER THE PROCEDURE  Place 1/4 cup of epsom salts in a quart of warm tap water .  Submerge your foot or feet with outer bandage intact for the initial soak; this will allow the bandage to become moist and wet for easy lift off.  Once you remove your bandage, continue to soak in the solution for 20 minutes.  This soak should be done twice a day.  Next, remove your foot or feet from solution, blot dry the affected area and cover.  You may use a band aid large enough to cover the area or use gauze and tape.  Apply other medications to the area as directed by the doctor such as polysporin neosporin.  IF YOUR SKIN BECOMES IRRITATED WHILE USING THESE INSTRUCTIONS, IT IS OKAY TO SWITCH TO  WHITE VINEGAR AND WATER . Or you may use antibacterial soap and water  to keep the toe clean  Monitor for any signs/symptoms of infection. Call the office immediately if any occur or go directly to the emergency room. Call with any questions/concerns.

## 2024-04-30 NOTE — Progress Notes (Signed)
 Subjective: Chief Complaint  Patient presents with   Nail Problem    Patient is here for [facility] for Dermatophytosis of nail, right foot ingrown nail medial border     83 y.o. returns the office today for painful, elongated, thickened toenails which No open lesions, swelling, redness or signs of infection he reports.  He has noticed some pain in the right big toenail but not sure what is going on.  He has not seen any drainage or pus.  He has chronically low platelets.  PCP: Plotnikov, Karlynn GAILS, MD  Objective: AAO 3, NAD DP/PT pulses palpable, CRT less than 3 seconds Neurological status is unchanged.  Nails hypertrophic, dystrophic, elongated, brittle, discolored 10. There is tenderness overlying the nails 1-5 bilaterally.  On the right hallux toenail there is ingrowing of the nail borders with localized edema and erythema on the corners.  There is no drainage or purulence noted today and there is no ascending cellulitis.  There is no fluctuation or crepitation. Hyperkeratotic lesion which is preulcerative noted today on the left second toe which she has had previously.   Hammertoes are present. No pain with calf compression, swelling, warmth, erythema.  Assessment: Patient presents with symptomatic onychomycosis; hyperkeratotic lesion   Plan: Symptomatic onychosis -Nails sharply debrided 10.  Minimal bleeding right third toenail.  In toto, was applied followed by dressing.  Discussed daily dressing changes. -Sharply pared hyperkeratotic lesion x 1 without any complications or bleeding  Ingrown toenail with infection right hallux - I spoke with debrided the corn at the nails without any complications and minimal bleeding occurred.  Discussed the possible need for removal but given his low platelet count and he has difficulty healing wounds were to try to hold off on this if possible.  Discussed with him clean with soap and water  and using antibiotic ointment dressing changes daily.   Prescribing Pearson.  He also use of Epsom salt soak.  Prescribe doxycycline  given infection.   Justin [name] DPM

## 2024-05-01 ENCOUNTER — Inpatient Hospital Stay

## 2024-05-01 VITALS — BP 125/62 | HR 73 | Temp 98.0°F | Resp 16

## 2024-05-01 DIAGNOSIS — D469 Myelodysplastic syndrome, unspecified: Secondary | ICD-10-CM

## 2024-05-01 DIAGNOSIS — D4620 Refractory anemia with excess of blasts, unspecified: Secondary | ICD-10-CM | POA: Diagnosis not present

## 2024-05-01 LAB — CMP (CANCER CENTER ONLY)
ALT: 5 U/L (ref 0–44)
AST: 18 U/L (ref 15–41)
Albumin: 4.2 g/dL (ref 3.5–5.0)
Alkaline Phosphatase: 62 U/L (ref 38–126)
Anion gap: 8 (ref 5–15)
BUN: 23 mg/dL (ref 8–23)
CO2: 25 mmol/L (ref 22–32)
Calcium: 9.1 mg/dL (ref 8.9–10.3)
Chloride: 108 mmol/L (ref 98–111)
Creatinine: 1.24 mg/dL (ref 0.61–1.24)
GFR, Estimated: 57 mL/min — ABNORMAL LOW (ref >60)
Glucose, Bld: 100 mg/dL — ABNORMAL HIGH (ref 70–99)
Potassium: 4 mmol/L (ref 3.5–5.1)
Sodium: 141 mmol/L (ref 135–145)
Total Bilirubin: 0.8 mg/dL (ref 0.0–1.2)
Total Protein: 7 g/dL (ref 6.5–8.1)

## 2024-05-01 LAB — CBC WITH DIFFERENTIAL (CANCER CENTER ONLY)
Abs Immature Granulocytes: 0.02 K/uL (ref 0.00–0.07)
Basophils Absolute: 0 K/uL (ref 0.0–0.1)
Basophils Relative: 1 %
Eosinophils Absolute: 0 K/uL (ref 0.0–0.5)
Eosinophils Relative: 1 %
HCT: 32.6 % — ABNORMAL LOW (ref 39.0–52.0)
Hemoglobin: 10.4 g/dL — ABNORMAL LOW (ref 13.0–17.0)
Immature Granulocytes: 1 %
Lymphocytes Relative: 23 %
Lymphs Abs: 0.9 K/uL (ref 0.7–4.0)
MCH: 24.5 pg — ABNORMAL LOW (ref 26.0–34.0)
MCHC: 31.9 g/dL (ref 30.0–36.0)
MCV: 76.7 fL — ABNORMAL LOW (ref 80.0–100.0)
Monocytes Absolute: 1.6 K/uL — ABNORMAL HIGH (ref 0.1–1.0)
Monocytes Relative: 40 %
Neutro Abs: 1.3 K/uL — ABNORMAL LOW (ref 1.7–7.7)
Neutrophils Relative %: 34 %
Platelet Count: 20 K/uL — ABNORMAL LOW (ref 150–400)
RBC: 4.25 MIL/uL (ref 4.22–5.81)
RDW: 16.1 % — ABNORMAL HIGH (ref 11.5–15.5)
WBC Count: 3.8 K/uL — ABNORMAL LOW (ref 4.0–10.5)
nRBC: 0 % (ref 0.0–0.2)

## 2024-05-01 LAB — SAMPLE TO BLOOD BANK

## 2024-05-01 MED ORDER — EPOETIN ALFA 20000 UNIT/ML IJ SOLN
20000.0000 [IU] | Freq: Once | INTRAMUSCULAR | Status: AC
  Administered 2024-05-01: 20000 [IU] via SUBCUTANEOUS
  Filled 2024-05-01: qty 1

## 2024-05-05 ENCOUNTER — Other Ambulatory Visit: Payer: Self-pay | Admitting: Internal Medicine

## 2024-05-05 DIAGNOSIS — H43812 Vitreous degeneration, left eye: Secondary | ICD-10-CM | POA: Diagnosis not present

## 2024-05-05 DIAGNOSIS — H353221 Exudative age-related macular degeneration, left eye, with active choroidal neovascularization: Secondary | ICD-10-CM | POA: Diagnosis not present

## 2024-05-05 DIAGNOSIS — H401131 Primary open-angle glaucoma, bilateral, mild stage: Secondary | ICD-10-CM | POA: Diagnosis not present

## 2024-05-05 DIAGNOSIS — H353112 Nonexudative age-related macular degeneration, right eye, intermediate dry stage: Secondary | ICD-10-CM | POA: Diagnosis not present

## 2024-05-12 ENCOUNTER — Ambulatory Visit: Admitting: Podiatry

## 2024-05-14 ENCOUNTER — Other Ambulatory Visit: Payer: Self-pay | Admitting: Internal Medicine

## 2024-05-14 NOTE — Telephone Encounter (Unsigned)
 Copied from CRM [id number]. Topic: Clinical - Medication Refill >> May 14, 2024  8:38 AM Berneda FALCON wrote: Medication: rOPINIRole  (REQUIP ) 2 MG tablet  gabapentin  (NEURONTIN ) 300 MG capsule   Has the patient contacted their pharmacy? Yes (Agent: If no, request that the patient contact the pharmacy for the refill. If patient does not wish to contact the pharmacy document the reason why and proceed with request.) (Agent: If yes, when and what did the pharmacy advise?)  This is the patient's preferred pharmacy:  [facility] - [place], [place] - [address] [place] [address] Phone: [phone] Fax: [phone]  Is this the correct pharmacy for this prescription? Yes If no, delete pharmacy and type the correct one.   Has the prescription been filled recently? No  Is the patient out of the medication? Yes  Has the patient been seen for an appointment in the last year OR does the patient have an upcoming appointment? Yes  Can we respond through MyChart? Yes  Agent: Please be advised that Rx refills may take up to 3 business days. We ask that you follow-up with your pharmacy.

## 2024-05-15 ENCOUNTER — Inpatient Hospital Stay: Attending: Physician Assistant

## 2024-05-15 ENCOUNTER — Inpatient Hospital Stay

## 2024-05-15 ENCOUNTER — Other Ambulatory Visit: Payer: Self-pay | Admitting: *Deleted

## 2024-05-15 ENCOUNTER — Telehealth: Payer: Self-pay

## 2024-05-15 ENCOUNTER — Ambulatory Visit: Admitting: Internal Medicine

## 2024-05-15 VITALS — BP 146/61 | HR 68 | Temp 97.7°F | Resp 17

## 2024-05-15 DIAGNOSIS — D469 Myelodysplastic syndrome, unspecified: Secondary | ICD-10-CM

## 2024-05-15 DIAGNOSIS — D4620 Refractory anemia with excess of blasts, unspecified: Secondary | ICD-10-CM | POA: Diagnosis not present

## 2024-05-15 LAB — CBC WITH DIFFERENTIAL (CANCER CENTER ONLY)
Abs Immature Granulocytes: 0.01 K/uL (ref 0.00–0.07)
Basophils Absolute: 0 K/uL (ref 0.0–0.1)
Basophils Relative: 1 %
Eosinophils Absolute: 0 K/uL (ref 0.0–0.5)
Eosinophils Relative: 1 %
HCT: 33.4 % — ABNORMAL LOW (ref 39.0–52.0)
Hemoglobin: 10.7 g/dL — ABNORMAL LOW (ref 13.0–17.0)
Immature Granulocytes: 1 %
Lymphocytes Relative: 38 %
Lymphs Abs: 0.8 K/uL (ref 0.7–4.0)
MCH: 24.2 pg — ABNORMAL LOW (ref 26.0–34.0)
MCHC: 32 g/dL (ref 30.0–36.0)
MCV: 75.6 fL — ABNORMAL LOW (ref 80.0–100.0)
Monocytes Absolute: 0.6 K/uL (ref 0.1–1.0)
Monocytes Relative: 28 %
Neutro Abs: 0.7 K/uL — ABNORMAL LOW (ref 1.7–7.7)
Neutrophils Relative %: 31 %
Platelet Count: 19 K/uL — ABNORMAL LOW (ref 150–400)
RBC: 4.42 MIL/uL (ref 4.22–5.81)
RDW: 16.1 % — ABNORMAL HIGH (ref 11.5–15.5)
WBC Count: 2.1 K/uL — ABNORMAL LOW (ref 4.0–10.5)
nRBC: 0 % (ref 0.0–0.2)

## 2024-05-15 LAB — CMP (CANCER CENTER ONLY)
ALT: 6 U/L (ref 0–44)
AST: 17 U/L (ref 15–41)
Albumin: 4.3 g/dL (ref 3.5–5.0)
Alkaline Phosphatase: 57 U/L (ref 38–126)
Anion gap: 8 (ref 5–15)
BUN: 22 mg/dL (ref 8–23)
CO2: 23 mmol/L (ref 22–32)
Calcium: 9.1 mg/dL (ref 8.9–10.3)
Chloride: 108 mmol/L (ref 98–111)
Creatinine: 1.12 mg/dL (ref 0.61–1.24)
GFR, Estimated: >60 mL/min (ref >60)
Glucose, Bld: 121 mg/dL — ABNORMAL HIGH (ref 70–99)
Potassium: 3.7 mmol/L (ref 3.5–5.1)
Sodium: 139 mmol/L (ref 135–145)
Total Bilirubin: 0.7 mg/dL (ref 0.0–1.2)
Total Protein: 6.9 g/dL (ref 6.5–8.1)

## 2024-05-15 LAB — SAMPLE TO BLOOD BANK

## 2024-05-15 MED ORDER — SODIUM CHLORIDE 0.9% IV SOLUTION
250.0000 mL | INTRAVENOUS | Status: DC
  Administered 2024-05-15: 100 mL via INTRAVENOUS

## 2024-05-15 MED ORDER — ACETAMINOPHEN 325 MG PO TABS
650.0000 mg | ORAL_TABLET | Freq: Once | ORAL | Status: AC
  Administered 2024-05-15: 650 mg via ORAL
  Filled 2024-05-15: qty 2

## 2024-05-15 MED ORDER — EPOETIN ALFA 20000 UNIT/ML IJ SOLN
20000.0000 [IU] | Freq: Once | INTRAMUSCULAR | Status: AC
  Administered 2024-05-15: 20000 [IU] via SUBCUTANEOUS
  Filled 2024-05-15: qty 1

## 2024-05-15 NOTE — Patient Instructions (Signed)
 Getting Platelets Through an IV (Platelet Transfusion) in Adults: What to Expect A platelet transfusion is when a person gets platelets from another person (donor) through an IV. Platelets are parts of blood that stick together and form a clot to help stop bleeding after an injury. If you don't have enough platelets, you might bleed or bruise easily. You may need a platelet transfusion if you have a health problem that causes a low number of platelets, such as thrombocytopenia. A platelet transfusion may be used to stop or prevent too much bleeding. Tell a health care provider about: Any reactions you've had during past transfusions. Any allergies you have. All medicines you take. These include vitamins, herbs, eye drops, and creams. Any bleeding problems you have. Any surgeries you've had. Any health problems you have. Whether you're pregnant or may be pregnant. What are the risks? Your health care provider will talk with you about risks. These may include: Fever or chills. This might happen if your body reacts to the new cells. It can happen during or up to 4 hours after the transfusion. A mild allergy. You might get red, swollen areas on your skin (hives). You might feel itchy. A bad allergy. You might have trouble breathing or swelling around your face and lips. Very bad reactions are rare but can happen. These may be: Infection. This is rare because donated blood is carefully tested. Hemolytic reaction. This is when the defense system (immune system) of your body attacks the new cells. Symptoms include fever, chills, and a feeling that you may throw up. You may also have low blood pressure and pain in your chest or lower back. Transfusion-associated circulatory overload (TACO). This happens if fluids move to your lungs. This may cause breathing problems. Transfusion-related acute lung injury (TRALI). TRALI can cause breathing problems and low oxygen in your blood. This can happen within  hours or days after the transfusion. Transfusion-associated graft-versus-host disease (TAGVHD). This happens when donated cells attack the body's healthy tissues. What happens before? Take your medicines only as told. You will have a blood test to find out your blood type. This helps match the donor blood with your blood. If you've had an allergy to a transfusion in the past, you may be given medicine to help prevent another allergy. Your temperature, blood pressure, pulse, and breathing will be checked. What happens during platelet transfusion?  An IV will be put into a vein in your hand or arm. For your safety, two health care team members will check your identity and the donor platelets. The bag of donor platelets will be connected to your IV. The platelets will flow into your blood. This usually takes 30-60 minutes. Your temperature, blood pressure, pulse, and breathing will be checked. This helps catch signs of a reaction early. You will be watched for symptoms of all types of reactions, like chills, hives, or itching. If you show signs of a reaction, the transfusion will be stopped. You may be given medicine to help treat the reaction. When your transfusion is done, your IV will be removed. Pressure may be put on the IV site for a few minutes to stop any bleeding. The IV site will be covered with a bandage. These steps may vary. Ask what you can expect. What happens after? You will be watched closely until you leave. This includes checking your blood pressure, temperature, pulse rate, and breathing rate again. This information is not intended to replace advice given to you by your health care  provider. Make sure you discuss any questions you have with your health care provider. Document Revised: 10/05/2023 Document Reviewed: 10/05/2023 Elsevier Patient Education  2025 [employer].

## 2024-05-15 NOTE — Telephone Encounter (Signed)
 Copied from CRM [id number]. Topic: Clinical - Medication Refill >> May 14, 2024  8:38 AM Berneda FALCON wrote: Medication: rOPINIRole  (REQUIP ) 2 MG tablet  gabapentin  (NEURONTIN ) 300 MG capsule   Has the patient contacted their pharmacy? Yes (Agent: If no, request that the patient contact the pharmacy for the refill. If patient does not wish to contact the pharmacy document the reason why and proceed with request.) (Agent: If yes, when and what did the pharmacy advise?)  This is the patient's preferred pharmacy:  [facility], [place] - [address] [place] [address] Phone: [phone] Fax: [phone]  Is this the correct pharmacy for this prescription? Yes If no, delete pharmacy and type the correct one.   Has the prescription been filled recently? No  Is the patient out of the medication? Yes  Has the patient been seen for an appointment in the last year OR does the patient have an upcoming appointment? Yes  Can we respond through MyChart? Yes  Agent: Please be advised that Rx refills may take up to 3 business days. We ask that you follow-up with your pharmacy. >> May 15, 2024 10:31 AM Rosina BIRCH wrote: Patient called stating he is checking on his medication because he is out of it [phone]

## 2024-05-15 NOTE — Progress Notes (Signed)
 Platelet count was 19 today. Desk RN notified, advised MD wants to do platelet infusion today. Patient informed, agreeable to plan. Infusion appt made by charge RN.

## 2024-05-16 ENCOUNTER — Other Ambulatory Visit: Payer: Self-pay | Admitting: Internal Medicine

## 2024-05-16 LAB — BPAM PLATELET PHERESIS
Blood Product Expiration Date: 202511142359
ISSUE DATE / TIME: 202511131109
Unit Type and Rh: 5100

## 2024-05-16 LAB — PREPARE PLATELET PHERESIS: Unit division: 0

## 2024-05-16 MED ORDER — ROPINIROLE HCL 2 MG PO TABS: ORAL_TABLET | ORAL | 1 refills | Status: DC

## 2024-05-16 MED ORDER — GABAPENTIN 300 MG PO CAPS: 300.0000 mg | ORAL_CAPSULE | Freq: Three times a day (TID) | ORAL | 1 refills | Status: DC

## 2024-05-19 ENCOUNTER — Other Ambulatory Visit: Payer: Self-pay | Admitting: Internal Medicine

## 2024-05-19 MED ORDER — GABAPENTIN 300 MG PO CAPS
300.0000 mg | ORAL_CAPSULE | Freq: Three times a day (TID) | ORAL | 2 refills | Status: AC
Start: 2024-05-19 — End: ?

## 2024-05-19 MED ORDER — ROPINIROLE HCL 2 MG PO TABS: ORAL_TABLET | ORAL | 2 refills | Status: AC

## 2024-05-19 NOTE — Telephone Encounter (Signed)
Ok Thx 

## 2024-05-22 DIAGNOSIS — M5416 Radiculopathy, lumbar region: Secondary | ICD-10-CM | POA: Diagnosis not present

## 2024-05-26 ENCOUNTER — Encounter (HOSPITAL_COMMUNITY): Payer: Self-pay

## 2024-05-26 ENCOUNTER — Inpatient Hospital Stay (HOSPITAL_COMMUNITY)
Admission: EM | Admit: 2024-05-26 | Discharge: 2024-06-01 | DRG: 603 | Disposition: A | Discharge status: Home Health | Attending: Internal Medicine | Admitting: Internal Medicine

## 2024-05-26 ENCOUNTER — Ambulatory Visit: Admitting: Family Medicine

## 2024-05-26 ENCOUNTER — Other Ambulatory Visit: Payer: Self-pay

## 2024-05-26 ENCOUNTER — Ambulatory Visit: Payer: Self-pay

## 2024-05-26 ENCOUNTER — Emergency Department (HOSPITAL_COMMUNITY)

## 2024-05-26 ENCOUNTER — Encounter: Payer: Self-pay | Admitting: Family Medicine

## 2024-05-26 VITALS — BP 110/60 | HR 67 | Temp 97.8°F | Ht 69.0 in | Wt 150.0 lb

## 2024-05-26 DIAGNOSIS — R52 Pain, unspecified: Secondary | ICD-10-CM | POA: Diagnosis not present

## 2024-05-26 DIAGNOSIS — R609 Edema, unspecified: Secondary | ICD-10-CM | POA: Diagnosis not present

## 2024-05-26 DIAGNOSIS — R0789 Other chest pain: Secondary | ICD-10-CM | POA: Insufficient documentation

## 2024-05-26 DIAGNOSIS — G2581 Restless legs syndrome: Secondary | ICD-10-CM | POA: Diagnosis present

## 2024-05-26 DIAGNOSIS — L03116 Cellulitis of left lower limb: Principal | ICD-10-CM | POA: Diagnosis present

## 2024-05-26 DIAGNOSIS — D469 Myelodysplastic syndrome, unspecified: Secondary | ICD-10-CM | POA: Diagnosis present

## 2024-05-26 DIAGNOSIS — L538 Other specified erythematous conditions: Secondary | ICD-10-CM | POA: Diagnosis not present

## 2024-05-26 DIAGNOSIS — R600 Localized edema: Secondary | ICD-10-CM | POA: Diagnosis not present

## 2024-05-26 DIAGNOSIS — N400 Enlarged prostate without lower urinary tract symptoms: Secondary | ICD-10-CM | POA: Diagnosis present

## 2024-05-26 DIAGNOSIS — L0390 Cellulitis, unspecified: Secondary | ICD-10-CM | POA: Diagnosis not present

## 2024-05-26 DIAGNOSIS — R262 Difficulty in walking, not elsewhere classified: Secondary | ICD-10-CM | POA: Insufficient documentation

## 2024-05-26 DIAGNOSIS — M7989 Other specified soft tissue disorders: Secondary | ICD-10-CM | POA: Diagnosis not present

## 2024-05-26 DIAGNOSIS — R5383 Other fatigue: Secondary | ICD-10-CM | POA: Diagnosis not present

## 2024-05-26 DIAGNOSIS — I2510 Atherosclerotic heart disease of native coronary artery without angina pectoris: Secondary | ICD-10-CM | POA: Diagnosis not present

## 2024-05-26 DIAGNOSIS — M545 Low back pain, unspecified: Secondary | ICD-10-CM | POA: Diagnosis present

## 2024-05-26 DIAGNOSIS — M85862 Other specified disorders of bone density and structure, left lower leg: Secondary | ICD-10-CM | POA: Diagnosis not present

## 2024-05-26 DIAGNOSIS — M5416 Radiculopathy, lumbar region: Secondary | ICD-10-CM | POA: Diagnosis present

## 2024-05-26 DIAGNOSIS — I10 Essential (primary) hypertension: Secondary | ICD-10-CM | POA: Diagnosis present

## 2024-05-26 DIAGNOSIS — R0781 Pleurodynia: Secondary | ICD-10-CM | POA: Diagnosis not present

## 2024-05-26 LAB — COMPREHENSIVE METABOLIC PANEL WITH GFR
ALT: 16 U/L (ref 0–44)
AST: 24 U/L (ref 15–41)
Albumin: 4 g/dL (ref 3.5–5.0)
Alkaline Phosphatase: 75 U/L (ref 38–126)
Anion gap: 11 (ref 5–15)
BUN: 26 mg/dL — ABNORMAL HIGH (ref 8–23)
CO2: 23 mmol/L (ref 22–32)
Calcium: 9.4 mg/dL (ref 8.9–10.3)
Chloride: 104 mmol/L (ref 98–111)
Creatinine, Ser: 1.38 mg/dL — ABNORMAL HIGH (ref 0.61–1.24)
GFR, Estimated: 50 mL/min — ABNORMAL LOW (ref >60)
Glucose, Bld: 106 mg/dL — ABNORMAL HIGH (ref 70–99)
Potassium: 3.7 mmol/L (ref 3.5–5.1)
Sodium: 138 mmol/L (ref 135–145)
Total Bilirubin: 0.8 mg/dL (ref 0.0–1.2)
Total Protein: 7.3 g/dL (ref 6.5–8.1)

## 2024-05-26 LAB — CBC WITH DIFFERENTIAL/PLATELET
Abs Immature Granulocytes: 0.06 K/uL (ref 0.00–0.07)
Basophils Absolute: 0 K/uL (ref 0.0–0.1)
Basophils Relative: 0 %
Eosinophils Absolute: 0 K/uL (ref 0.0–0.5)
Eosinophils Relative: 0 %
HCT: 34.4 % — ABNORMAL LOW (ref 39.0–52.0)
Hemoglobin: 10.4 g/dL — ABNORMAL LOW (ref 13.0–17.0)
Immature Granulocytes: 1 %
Lymphocytes Relative: 7 %
Lymphs Abs: 0.7 K/uL (ref 0.7–4.0)
MCH: 23.6 pg — ABNORMAL LOW (ref 26.0–34.0)
MCHC: 30.2 g/dL (ref 30.0–36.0)
MCV: 78 fL — ABNORMAL LOW (ref 80.0–100.0)
Monocytes Absolute: 2.2 K/uL — ABNORMAL HIGH (ref 0.1–1.0)
Monocytes Relative: 22 %
Neutro Abs: 7 K/uL (ref 1.7–7.7)
Neutrophils Relative %: 70 %
Platelets: 37 K/uL — ABNORMAL LOW (ref 150–400)
RBC: 4.41 MIL/uL (ref 4.22–5.81)
RDW: 17 % — ABNORMAL HIGH (ref 11.5–15.5)
Smear Review: NORMAL
WBC: 10 K/uL (ref 4.0–10.5)
nRBC: 0 % (ref 0.0–0.2)

## 2024-05-26 LAB — LACTIC ACID, PLASMA: Lactic Acid, Venous: 1 mmol/L (ref 0.5–1.9)

## 2024-05-26 MED ORDER — ACETAMINOPHEN 650 MG RE SUPP: 650.0000 mg | Freq: Four times a day (QID) | RECTAL | Status: DC | PRN

## 2024-05-26 MED ORDER — IOHEXOL 350 MG/ML SOLN
75.0000 mL | Freq: Once | INTRAVENOUS | Status: AC | PRN
  Administered 2024-05-26: 75 mL via INTRAVENOUS

## 2024-05-26 MED ORDER — SODIUM CHLORIDE 0.9 % IV BOLUS
500.0000 mL | Freq: Once | INTRAVENOUS | Status: AC
  Administered 2024-05-26: 500 mL via INTRAVENOUS

## 2024-05-26 MED ORDER — SODIUM CHLORIDE 0.9 % IV SOLN
2.0000 g | Freq: Once | INTRAVENOUS | Status: AC
  Administered 2024-05-26: 2 g via INTRAVENOUS
  Filled 2024-05-26: qty 12.5

## 2024-05-26 MED ORDER — GABAPENTIN 300 MG PO CAPS
300.0000 mg | ORAL_CAPSULE | Freq: Once | ORAL | Status: AC
  Administered 2024-05-26: 300 mg via ORAL
  Filled 2024-05-26: qty 1

## 2024-05-26 MED ORDER — ONDANSETRON HCL 4 MG/2ML IJ SOLN: 4.0000 mg | Freq: Four times a day (QID) | INTRAMUSCULAR | Status: DC | PRN

## 2024-05-26 MED ORDER — ACETAMINOPHEN 325 MG PO TABS
650.0000 mg | ORAL_TABLET | Freq: Four times a day (QID) | ORAL | Status: DC | PRN
  Administered 2024-05-26 – 2024-05-27 (×2): 650 mg via ORAL
  Filled 2024-05-26 (×2): qty 2

## 2024-05-26 MED ORDER — CARBIDOPA-LEVODOPA 25-100 MG PO TABS
1.0000 | ORAL_TABLET | Freq: Once | ORAL | Status: AC
  Administered 2024-05-26: 1 via ORAL
  Filled 2024-05-26: qty 1

## 2024-05-26 MED ORDER — OXYCODONE HCL 5 MG PO TABS
5.0000 mg | ORAL_TABLET | ORAL | Status: DC | PRN
  Administered 2024-05-26: 5 mg via ORAL
  Administered 2024-05-27 (×2): 10 mg via ORAL
  Filled 2024-05-26 (×2): qty 1
  Filled 2024-05-26: qty 2
  Filled 2024-05-26 (×6): qty 1
  Filled 2024-05-26: qty 2
  Filled 2024-05-26 (×5): qty 1

## 2024-05-26 MED ORDER — CEFAZOLIN SODIUM-DEXTROSE 2-4 GM/100ML-% IV SOLN
2.0000 g | Freq: Three times a day (TID) | INTRAVENOUS | Status: DC
  Administered 2024-05-26 – 2024-05-27 (×2): 2 g via INTRAVENOUS
  Filled 2024-05-26 (×2): qty 100

## 2024-05-26 MED ORDER — SODIUM CHLORIDE 0.9% FLUSH
3.0000 mL | Freq: Two times a day (BID) | INTRAVENOUS | Status: DC
  Administered 2024-05-26 – 2024-05-27 (×2): 3 mL via INTRAVENOUS

## 2024-05-26 MED ORDER — SENNA 8.6 MG PO TABS
1.0000 | ORAL_TABLET | Freq: Every day | ORAL | Status: DC | PRN
  Filled 2024-05-26: qty 1

## 2024-05-26 MED ORDER — POLYVINYL ALCOHOL 1.4 % OP SOLN
1.0000 [drp] | OPHTHALMIC | Status: DC | PRN
  Filled 2024-05-26 (×2): qty 15

## 2024-05-26 MED ORDER — ONDANSETRON HCL 4 MG PO TABS: 4.0000 mg | ORAL_TABLET | Freq: Four times a day (QID) | ORAL | Status: DC | PRN

## 2024-05-26 MED ORDER — ROPINIROLE HCL 1 MG PO TABS
2.0000 mg | ORAL_TABLET | Freq: Once | ORAL | Status: AC
  Administered 2024-05-26: 2 mg via ORAL
  Filled 2024-05-26: qty 2

## 2024-05-26 NOTE — H&P (Signed)
 History and Physical    Justin [name] [place]. FMW:[id number] DOB: 1941-06-07 DOA: 05/26/2024  PCP: Garald Karlynn GAILS, MD   Patient coming from: Home   Chief Complaint: Left leg redness, pain, and swelling   HPI: Justin [name]. is an 83 y.o. male with medical history significant for hypertension, CAD, remote alcoholism, MDS, and recurrent skin and soft tissue infections who presents with redness, pain, and swelling involving the lower left leg.  Patient noticed increased left lower leg swelling, redness, and pain roughly 4 days ago.  There has been progressive worsening since then and he has developed increased chills and general malaise.  ED Course: Upon arrival to the ED, patient is found to be afebrile and saturating well on room air with normal HR and stable BP.  Labs are most notable for creatinine 1.38, normal WBC, normal lactic acid, hemoglobin 10.4, and platelets 37,000.  There is no DVT on the left lower extremity venous Doppler.  CTA chest is negative for PE or other acute finding.  There is diffuse subcutaneous edema without fracture or dislocation on plain radiographs of the left tib/fib.  Blood cultures were collected in the ED and the patient was treated with cefepime .  Review of Systems:  All other systems reviewed and apart from HPI, are negative.  Past Medical History:  Diagnosis Date   Alcoholism (HCC)    Dry 13 years   Basal cell carcinoma    Right Chest   BPH (benign prostatic hyperplasia)    Diverticulosis of colon    Dizzy spells    none recent   DJD (degenerative joint disease)    lower back   Elbow fracture, left 2009   Dr Celena   FRACTURE, [place], LEFT 12/23/2007   Qualifier: Diagnosis of   By: Georgian ROSALEA CHARM Lamar        GERD (gastroesophageal reflux disease)    Glaucoma    History of TIAs 1 yrs ago   Hyperkeratosis    LBP (low back pain)    Macular degeneration    left wet right dry sees dr elner   Melanoma [facility]) 2009   x3 Dr Amy Jordan    Osteoarthritis    Restless leg syndrome    Thrombocytopenia 11/18/2021   Tinnitus    Tobacco abuse    Uses walker    Wears glasses    reading   Wears partial dentures    upper    Past Surgical History:  Procedure Laterality Date   CATARACT EXTRACTION Bilateral 2018   HARDWARE REMOVAL Left 07/09/2020   Procedure: Left elbow hardware removal;  Surgeon: Sharl Selinda Dover, MD;  Location[facility];  Service: Orthopedics;  Laterality: Left;  60 mins   HARDWARE REMOVAL Left 10/23/2023   Procedure: REMOVAL, HARDWARE;  Surgeon: Celena Sharper, MD;  Location: [facility] OR;  Service: Orthopedics;  Laterality: Left;   IRRIGATION AND DEBRIDEMENT ELBOW Left 10/23/2023   Procedure: IRRIGATION AND DEBRIDEMENT ELBOW;  Surgeon: Celena Sharper, MD;  Location: [facility] OR;  Service: Orthopedics;  Laterality: Left;   mda     Dr. Elner wet injection   MELANOMA EXCISION  2009   x3   RECONSTRUCTION MEDIAL COLLATERAL LIGAMENT ELBOW W/ TENDON GRAFT  2009   Left, Dr Celena   TONSILLECTOMY  as child   TOTAL KNEE ARTHROPLASTY Right 11/09/2020   Procedure: TOTAL KNEE ARTHROPLASTY;  Surgeon: Ernie Cough, MD;  Location: [facility] ORS;  Service: Orthopedics;  Laterality: Right;   TRANSESOPHAGEAL ECHOCARDIOGRAM (CATH  LAB) N/A 10/24/2023   Procedure: TRANSESOPHAGEAL ECHOCARDIOGRAM;  Surgeon: Jeffrie Oneil BROCKS, MD;  Location: [facility] INVASIVE CV LAB;  Service: Cardiovascular;  Laterality: N/A;    Social History:   reports that he quit smoking about 39 years ago. His smoking use included cigarettes. He started smoking about 77 years ago. He has a 114 pack-year smoking history. He has never used smokeless tobacco. He reports that he does not currently use drugs. He reports that he does not drink alcohol .  Allergies  Allergen Reactions   Levofloxacin Other (See Comments)    Hands peeled    Family History  Problem Relation Age of Onset   Cancer Mother        ?breast   Cancer Father        Lung     Prior to  Admission medications   Medication Sig Start Date End Date Taking? Authorizing Provider  acetaminophen  (TYLENOL ) 500 MG tablet Take 1,000 mg by mouth every 6 (six) hours as needed for mild pain (pain score 1-3) or moderate pain (pain score 4-6).    [provider]  amLODipine  (NORVASC ) 5 MG tablet TAKE 1 TABLET BY MOUTH DAILY 05/05/24   Plotnikov, Aleksei V, MD  calcium  carbonate (TUMS EX) 750 MG chewable tablet Chew 750-1,500 mg by mouth 4 (four) times daily as needed for heartburn.    [provider]  carbidopa -levodopa  (SINEMET  IR) 25-100 MG tablet For breakthrough restless leg, you can take 1 tablet at bedtime.  OK to take extra tab as needed during the day. 03/22/20   Patel, Donika K, DO  Cholecalciferol  1000 UNITS tablet Take 1,000 Units by mouth in the morning, at noon, and at bedtime.    [provider]  Cyanocobalamin  (VITAMIN B-12) 1000 MCG SUBL DISSOLVE 2 TABLETS IN MOUTH EVERY DAY 05/10/21   Plotnikov, Aleksei V, MD  doxycycline  (VIBRA -TABS) 100 MG tablet Take 1 tablet (100 mg total) by mouth 2 (two) times daily. 04/28/24   Gershon Donnice SAUNDERS, DPM  finasteride  (PROSCAR ) 5 MG tablet TAKE 1 TABLET (5 MG TOTAL) BY MOUTH DAILY. 08/24/23   Plotnikov, Aleksei V, MD  furosemide  (LASIX ) 20 MG tablet Take 1 tablet (20 mg total) by mouth as needed for edema or fluid. FOR 3 POUND WEIGHT GAIN OVER NIGHT AND 5 POUND WEIGHT GAIN IN A WEEK 03/17/24   Loni Soyla LABOR, MD  gabapentin  (NEURONTIN ) 300 MG capsule Take 1 capsule (300 mg total) by mouth 3 (three) times daily. 05/19/24   Plotnikov, Aleksei V, MD  ibuprofen (ADVIL) 200 MG tablet Take 400 mg by mouth every 6 (six) hours as needed for mild pain (pain score 1-3) or moderate pain (pain score 4-6).    [provider]  magnesium  hydroxide (MILK OF MAGNESIA) 400 MG/5ML suspension Take 15 mLs by mouth daily as needed for mild constipation.    [provider]  Multiple Vitamins-Minerals (PRESERVISION AREDS PO)  Take 1 tablet by mouth 2 (two) times daily.    [provider]  mupirocin  ointment (BACTROBAN ) 2 % Apply 1 Application topically 2 (two) times daily. 04/28/24   Gershon Donnice SAUNDERS, DPM  ondansetron  (ZOFRAN ) 4 MG tablet Take 1 tablet (4 mg total) by mouth every 6 (six) hours as needed for nausea or vomiting. 10/26/23   Laurence Locus, DO  Oxycodone  HCl 10 MG TABS Take 1 tablet (10 mg total) by mouth 5 (five) times daily for 5 days. 10/26/23 05/26/24  Laurence Locus, DO  Polyethyl Glycol-Propyl Glycol (SYSTANE OP) Place  1 drop into both eyes 2 (two) times daily as needed (dry eyes). [facility]    [provider]  pravastatin  (PRAVACHOL ) 20 MG tablet TAKE 1 TABLET (20 MG TOTAL) BY MOUTH DAILY. 08/24/23   Plotnikov, Karlynn GAILS, MD  rOPINIRole  (REQUIP ) 2 MG tablet TAKE 1 TABLET BY MOUTH AT AT NOON AND 1 TAB AT 4PM AND 1 TAB AT BEDTIME 05/19/24   Plotnikov, Karlynn GAILS, MD  tamsulosin  (FLOMAX ) 0.4 MG CAPS capsule TAKE 1 CAPSULE (0.4 MG TOTAL) BY MOUTH IN THE MORNING. 11/22/23   Plotnikov, Karlynn GAILS, MD  terazosin  (HYTRIN ) 2 MG capsule TAKE 1 CAPSULE BY MOUTH EVERY NIGHT AT BEDTIME 05/05/24   Plotnikov, Karlynn GAILS, MD    Physical Exam: Vitals:   05/26/24 1145 05/26/24 1151 05/26/24 1345  BP: 138/69  127/66  Pulse: 78  60  Resp: 16  13  Temp: 98 F (36.7 C)    TempSrc: Oral    SpO2: 98%  98%  Weight:  68 kg   Height:  5' 9 (1.753 m)     Constitutional: NAD, no pallor or diaphoresis   Eyes: PERTLA, lids and conjunctivae normal ENMT: Mucous membranes are moist. Posterior pharynx clear of any exudate or lesions.   Neck: supple, no masses  Respiratory: no wheezing, no crackles. No accessory muscle use.  Cardiovascular: S1 & S2 heard, regular rate and rhythm. No JVD. Abdomen: No distension, no tenderness, soft. Bowel sounds active.  Musculoskeletal: no clubbing / cyanosis. No joint deformity upper and lower extremities.   Skin: Left lower leg erythema, edema, heat, and tenderness. Skin is  otherwise warm, dry, well-perfused. Neurologic: CN 2-12 grossly intact. Moving all extremities. Alert and oriented.  Psychiatric: Calm. Cooperative.    Labs and Imaging on Admission: I have personally reviewed following labs and imaging studies  CBC: Recent Labs  Lab 05/26/24 1215  WBC 10.0  NEUTROABS 7.0  HGB 10.4*  HCT 34.4*  MCV 78.0*  PLT 37*   Basic Metabolic Panel: Recent Labs  Lab 05/26/24 1215  NA 138  K 3.7  CL 104  CO2 23  GLUCOSE 106*  BUN 26*  CREATININE 1.38*  CALCIUM  9.4   GFR: Estimated Creatinine Clearance: 37.6 mL/min (A) (by C-G formula based on SCr of 1.38 mg/dL (H)). Liver Function Tests: Recent Labs  Lab 05/26/24 1215  AST 24  ALT 16  ALKPHOS 75  BILITOT 0.8  PROT 7.3  ALBUMIN  4.0   No results for input(s): LIPASE, AMYLASE in the last 168 hours. No results for input(s): AMMONIA in the last 168 hours. Coagulation Profile: No results for input(s): INR, PROTIME in the last 168 hours. Cardiac Enzymes: No results for input(s): CKTOTAL, CKMB, CKMBINDEX, TROPONINI in the last 168 hours. BNP (last 3 results) No results for input(s): PROBNP in the last 8760 hours. HbA1C: No results for input(s): HGBA1C in the last 72 hours. CBG: No results for input(s): GLUCAP in the last 168 hours. Lipid Profile: No results for input(s): CHOL, HDL, LDLCALC, TRIG, CHOLHDL, LDLDIRECT in the last 72 hours. Thyroid  Function Tests: No results for input(s): TSH, T4TOTAL, FREET4, T3FREE, THYROIDAB in the last 72 hours. Anemia Panel: No results for input(s): VITAMINB12, FOLATE, FERRITIN, TIBC, IRON, RETICCTPCT in the last 72 hours. Urine analysis:    Component Value Date/Time   COLORURINE YELLOW 11/17/2020 1351   APPEARANCEUR CLEAR 11/17/2020 1351   LABSPEC 1.015 11/17/2020 1351   PHURINE 7.5 11/17/2020 1351   GLUCOSEU NEGATIVE 11/17/2020 1351   HGBUR NEGATIVE 11/17/2020  1351   BILIRUBINUR NEGATIVE  11/17/2020 1351   KETONESUR TRACE (A) 11/17/2020 1351   PROTEINUR NEGATIVE 09/29/2017 1627   UROBILINOGEN 0.2 11/17/2020 1351   NITRITE NEGATIVE 11/17/2020 1351   LEUKOCYTESUR NEGATIVE 11/17/2020 1351   Sepsis Labs: @LABRCNTIP (procalcitonin:4,lacticidven:4) )No results found for this or any previous visit (from the past 240 hours).   Radiological Exams on Admission: VAS US  LOWER EXTREMITY VENOUS (DVT) (ONLY [facility]) Result Date: 05/26/2024  Lower Venous DVT Study Patient Name:  Justin [name] [facility].  Date of Exam:   05/26/2024 Medical Rec #: [id number]               Accession #:    [id number] Date of Birth: Oct 16, 1940               Patient Gender: M Patient Age:   1 years Exam Location:  [facility] Procedure:      VAS US  LOWER EXTREMITY VENOUS (DVT) Referring Phys: ALEXIS YOUNG --------------------------------------------------------------------------------  Indications: Pain, Swelling, and Erythema. Other Indications: Cellulitis. Risk Factors: Myelodysplastic syndrome. Limitations: Poor ultrasound/tissue interface. Comparison Study: Previous exam on 11/15/2023 was negative for DVT Performing Technologist: Alberta Lis RVS  Examination Guidelines: A complete evaluation includes B-mode imaging, spectral Doppler, color Doppler, and power Doppler as needed of all accessible portions of each vessel. Bilateral testing is considered an integral part of a complete examination. Limited examinations for reoccurring indications may be performed as noted. The reflux portion of the exam is performed with the patient in reverse Trendelenburg.  +-----+---------------+---------+-----------+----------+--------------+ RIGHTCompressibilityPhasicitySpontaneityPropertiesThrombus Aging +-----+---------------+---------+-----------+----------+--------------+ CFV  Full           Yes      Yes                                 +-----+---------------+---------+-----------+----------+--------------+    +---------+---------------+---------+-----------+----------+-------------------+ LEFT     CompressibilityPhasicitySpontaneityPropertiesThrombus Aging      +---------+---------------+---------+-----------+----------+-------------------+ CFV      Full           Yes      Yes                                      +---------+---------------+---------+-----------+----------+-------------------+ SFJ      Full                                                             +---------+---------------+---------+-----------+----------+-------------------+ FV Prox  Full                                                             +---------+---------------+---------+-----------+----------+-------------------+ FV Mid   Full                                                             +---------+---------------+---------+-----------+----------+-------------------+ FV DistalFull                                                             +---------+---------------+---------+-----------+----------+-------------------+  PFV      Full                                                             +---------+---------------+---------+-----------+----------+-------------------+ POP      Full           Yes      Yes                                      +---------+---------------+---------+-----------+----------+-------------------+ PTV      Full                                                             +---------+---------------+---------+-----------+----------+-------------------+ PERO                                                  Not well visualized +---------+---------------+---------+-----------+----------+-------------------+     Summary: RIGHT: - No evidence of common femoral vein obstruction.   LEFT: - There is no evidence of deep vein thrombosis in the lower extremity.  - No cystic structure found in the popliteal fossa. Subcutaneous in area of calf.  *See table(s)  above for measurements and observations. Electronically signed by Lonni Gaskins MD on 05/26/2024 at 5:00:15 PM.    Final    CT Angio Chest PE W and/or Wo Contrast Result Date: 05/26/2024 CLINICAL DATA:  Left leg swelling and pain, myelodysplastic syndrome, right rib pain EXAM: CT ANGIOGRAPHY CHEST WITH CONTRAST TECHNIQUE: Multidetector CT imaging of the chest was performed using the standard protocol during bolus administration of intravenous contrast. Multiplanar CT image reconstructions and MIPs were obtained to evaluate the vascular anatomy. RADIATION DOSE REDUCTION: This exam was performed according to the departmental dose-optimization program which includes automated exposure control, adjustment of the mA and/or kV according to patient size and/or use of iterative reconstruction technique. CONTRAST:  75mL OMNIPAQUE  IOHEXOL  350 MG/ML SOLN COMPARISON:  10/28/2022, 07/23/2018 FINDINGS: Cardiovascular: This is a technically adequate evaluation of the pulmonary vasculature. No filling defects or pulmonary emboli. The heart is unremarkable without pericardial effusion. No evidence of thoracic aortic aneurysm or dissection. Atherosclerosis of the aorta and coronary vasculature. Mediastinum/Nodes: No enlarged mediastinal, hilar, or axillary lymph nodes. Thyroid  gland, trachea, and esophagus demonstrate no significant findings. Lungs/Pleura: No acute airspace disease, effusion, or pneumothorax. Central airways are patent. Upper Abdomen: No acute abnormality. Musculoskeletal: Chronic healing left posterior ninth through eleventh rib fractures. No acute or destructive bony abnormalities. Reconstructed images demonstrate no additional findings. Review of the MIP images confirms the above findings. IMPRESSION: 1. No evidence of pulmonary embolus. 2. No acute intrathoracic process. 3. Aortic Atherosclerosis (ICD10-I70.0). Coronary artery atherosclerosis. Electronically Signed   By: Ozell Daring M.D.   On:  05/26/2024 15:03   DG Tibia/Fibula Left Result Date: 05/26/2024 CLINICAL DATA:  Cellulitis. EXAM: LEFT TIBIA AND FIBULA - 2 VIEW COMPARISON:  None Available. FINDINGS: No acute fracture  or dislocation. The bones are mildly osteopenic. There is diffuse subcutaneous edema which may represent cellulitis. No opaque foreign object or soft tissue gas. IMPRESSION: 1. No acute fracture or dislocation. 2. Diffuse subcutaneous edema. Electronically Signed   By: Vanetta Chou M.D.   On: 05/26/2024 13:39    EKG: Independently reviewed. Sinus rhythm, PVCs.   Assessment/Plan  1. Left lower leg cellulitis  - Treat with cefazolin , follow cultures and clinical course, obtain advanced imaging if worsens or fails to improve as expected    2. MDS  - Followed by Dr. Federico and treated with Retacrit  every 2 weeks  - Counts appear stable    DVT prophylaxis: Lovenox  Code Status: Full  Level of Care: Level of care: Med-Surg Family Communication: Daughter at bedside  Disposition Plan:  Patient is from: Home  Anticipated d/c is to: TBD Anticipated d/c date is: 05/29/24  Patient currently: Pending clinical improvement  Consults called: None  Admission status: Inpatient     Evalene GORMAN Sprinkles, MD [facility]  05/26/2024, 5:04 PM

## 2024-05-26 NOTE — Progress Notes (Signed)
 VASCULAR LAB    Left lower extremity venous duplex has been performed.  See CV proc for preliminary results.  Gave verbal report to Thersia Salt, PA-C  Lochlann Mastrangelo, RVT 05/26/2024, 3:14 PM

## 2024-05-26 NOTE — ED Triage Notes (Signed)
 Pt A&O arrived POV. Pt reports left leg swelling & pain. Pt reports having myelodysplastic syndrome. Significant swelling & redness observed on left lower leg. Pt also reports right rib pain, that gets worse when he moves; described as stabbing pain.

## 2024-05-26 NOTE — Patient Instructions (Signed)
Please go to the emergency department for further evaluation and treatment.

## 2024-05-26 NOTE — Telephone Encounter (Signed)
 FYI Only or Action Required?: FYI only for provider: appointment scheduled on today.  Patient was last seen in primary care on 03/11/2024 by Plotnikov, Karlynn GAILS, MD.  Called Nurse Triage reporting Leg Swelling.hx of cellulitis on same leg. Pain right side of chest under ribs  Symptoms began over the weekend.  Interventions attempted: Nothing.  Symptoms are: gradually worsening.  Triage Disposition: See Physician Within 24 Hours  Patient/caregiver understands and will follow disposition?: Yes                         Daughter needed to disconnect prior to transfer.  Will call her back.       Copied from CRM [id number]. Topic: Clinical - Red Word Triage >> May 26, 2024  8:09 AM Kevelyn M wrote: Red Word that prompted transfer to Nurse Triage: Daughter [name] calling in for the patient possible cellulitis on left leg at the bottom. Swollen, red, and painful to walk on it. Started over the weekend.  Daughter - [phone] Reason for Disposition  [1] MODERATE leg swelling (e.g., swelling extends up to knees) AND [2] new-onset or getting worse  Answer Assessment - Initial Assessment Questions 1. ONSET: When did the swelling start? (e.g., minutes, hours, days)     Over the weekend 2. LOCATION: What part of the leg is swollen?  Are both legs swollen or just one leg?     Lower led 3. SEVERITY: How bad is the swelling? (e.g., localized; mild, moderate, severe)     moderate 4. REDNESS: Is there redness or signs of infection?     redness 5. PAIN: Is the swelling painful to touch? If Yes, ask: How painful is it?   (Scale 1-10; mild, moderate or severe)     Both leg and pain under right rib cage. 6. FEVER: Do you have a fever? If Yes, ask: What is it, how was it measured, and when did it start?      unsure 7. CAUSE: What do you think is causing the leg swelling?     Cellulitis  9. RECURRENT SYMPTOM: Have you had leg swelling before? If Yes,  ask: When was the last time? What happened that time?     yes 10. OTHER SYMPTOMS: Do you have any other symptoms? (e.g., chest pain, difficulty breathing)       Weakness, pain under right side under ribs  Protocols used: Leg Swelling and Edema-A-AH

## 2024-05-26 NOTE — Progress Notes (Signed)
 Subjective:     Patient ID: Justin [name]., male    DOB: 01-04-1941, 83 y.o.   MRN: [id number]  Chief Complaint  Patient presents with   Leg Pain    Left leg swelling and pain ongoing since last thurs/fri  Sharp pain on lower right side of abdomen ongoing for week only when moving feels     Leg Pain     Discussed the use of AI scribe software for clinical note transcription with the patient, who gave verbal consent to proceed.  History of Present Illness Justin [name]. is an 83 year old male with myelodysplastic syndrome who presents with recurrent cellulitis of the leg.  Lower extremity swelling and cellulitis - Recurrent cellulitis of the leg, with current episode beginning Thursday - Significant swelling and pain in the affected leg - Pain with walking due to pressure and pulling on the skin of the swollen leg - History of cellulitis in the same leg, last episode in August requiring hospitalization and IV antibiotics - No history of blood clots  Constitutional symptoms - Felt weak and exhausted over the weekend, nearly falling into bed on Saturday night - Felt like he had a fever on Saturday, but no chills - Not eating much but drinking fluids  Hematologic abnormalities - Myelodysplastic syndrome with chronic cytopenias - Low white blood cell count, typically between 2 and 3 - Low platelet count, most recently 19 prior to platelet transfusion almost two weeks ago - Receives bi-weekly treatments for hemoglobin, platelets, and white blood cell count, including blood work and injections - Received platelet transfusion last Thursday  Right upper quadrant pain - Stabbing pain to right ribcage for about one week - Pain occurs with movement, sitting up, or standing - Pain is localized over the rib and does not radiate to the abdomen  Gastrointestinal symptoms - No nausea or vomiting     Health Maintenance Due  Topic Date Due   COVID-19 Vaccine (4 -  2025-26 season) 03/03/2024   Medicare Annual Wellness (AWV)  07/19/2024    Past Medical History:  Diagnosis Date   Alcoholism (HCC)    Dry 13 years   Basal cell carcinoma    Right Chest   BPH (benign prostatic hyperplasia)    Diverticulosis of colon    Dizzy spells    none recent   DJD (degenerative joint disease)    lower back   Elbow fracture, left 2009   Dr Celena   FRACTURE, ARM, LEFT 12/23/2007   Qualifier: Diagnosis of   By: Georgian ROSALEA CHARM Lamar        GERD (gastroesophageal reflux disease)    Glaucoma    History of TIAs 1 yrs ago   Hyperkeratosis    LBP (low back pain)    Macular degeneration    left wet right dry sees dr elner   Melanoma [facility]) 2009   x3 Dr Amy Jordan   Osteoarthritis    Restless leg syndrome    Thrombocytopenia 11/18/2021   Tinnitus    Tobacco abuse    Uses walker    Wears glasses    reading   Wears partial dentures    upper    Past Surgical History:  Procedure Laterality Date   CATARACT EXTRACTION Bilateral 2018   HARDWARE REMOVAL Left 07/09/2020   Procedure: Left elbow hardware removal;  Surgeon: Sharl Selinda Dover, MD;  Location: [facility];  Service: Orthopedics;  Laterality: Left;  60 mins  HARDWARE REMOVAL Left 10/23/2023   Procedure: REMOVAL, HARDWARE;  Surgeon: Celena Sharper, MD;  Location: [facility] OR;  Service: Orthopedics;  Laterality: Left;   IRRIGATION AND DEBRIDEMENT ELBOW Left 10/23/2023   Procedure: IRRIGATION AND DEBRIDEMENT ELBOW;  Surgeon: Celena Sharper, MD;  Location: [facility] OR;  Service: Orthopedics;  Laterality: Left;   mda     Dr. Elner wet injection   MELANOMA EXCISION  2009   x3   RECONSTRUCTION MEDIAL COLLATERAL LIGAMENT ELBOW W/ TENDON GRAFT  2009   Left, Dr Celena   TONSILLECTOMY  as child   TOTAL KNEE ARTHROPLASTY Right 11/09/2020   Procedure: TOTAL KNEE ARTHROPLASTY;  Surgeon: Ernie Cough, MD;  Location: [facility] ORS;  Service: Orthopedics;  Laterality: Right;   TRANSESOPHAGEAL ECHOCARDIOGRAM (CATH LAB)  N/A 10/24/2023   Procedure: TRANSESOPHAGEAL ECHOCARDIOGRAM;  Surgeon: Jeffrie Oneil BROCKS, MD;  Location: [facility] INVASIVE CV LAB;  Service: Cardiovascular;  Laterality: N/A;    Family History  Problem Relation Age of Onset   Cancer Mother        ?breast   Cancer Father        Lung    Social History   Socioeconomic History   Marital status: Married    Spouse name: Rollene Caldron   Number of children: 2   Years of education: Not on file   Highest education level: Not on file  Occupational History   Occupation: [employer] - [employer]    Employer: RETIRED   Occupation: Scientist, Research (physical Sciences)   Occupation: Games Developer  Tobacco Use   Smoking status: Former    Current packs/day: 0.00    Average packs/day: 3.0 packs/day for 38.0 years (114.0 ttl pk-yrs)    Types: Cigarettes    Start date: 26    Quit date: 1986    Years since quitting: 39.9   Smokeless tobacco: Never   Tobacco comments:    states he quit May 15 1985  Vaping Use   Vaping status: Never Used  Substance and Sexual Activity   Alcohol  use: No    Comment: PREVIOUS - DRY 3yrs   Drug use: Not Currently   Sexual activity: Not Currently  Other Topics Concern   Not on file  Social History Narrative   Right handed   One story home   Drinks coffee q am      Son and wife lives with him-2025   Social Drivers of Health   Financial Resource Strain: Low Risk  (07/20/2023)   Overall Financial Resource Strain (CARDIA)    Difficulty of Paying Living Expenses: Not very hard  Food Insecurity: No Food Insecurity (02/11/2024)   Hunger Vital Sign    Worried About Running Out of Food in the Last Year: Never true    Ran Out of Food in the Last Year: Never true  Transportation Needs: No Transportation Needs (02/11/2024)   PRAPARE - Administrator, Civil Service (Medical): No    Lack of Transportation (Non-Medical): No  Physical Activity: Inactive (07/20/2023)   Exercise Vital Sign    Days of Exercise per Week: 0  days    Minutes of Exercise per Session: 0 min  Stress: No Stress Concern Present (07/20/2023)   [employer] of Occupational Health - Occupational Stress Questionnaire    Feeling of Stress : Not at all  Social Connections: Moderately Isolated (02/07/2024)   Social Connection and Isolation Panel    Frequency of Communication with Friends and Family: More than three times a week  Frequency of Social Gatherings with Friends and Family: More than three times a week    Attends Religious Services: Never    Database Administrator or Organizations: No    Attends Banker Meetings: Never    Marital Status: Married  Catering Manager Violence: Not At Risk (02/11/2024)   Humiliation, Afraid, Rape, and Kick questionnaire    Fear of Current or Ex-Partner: No    Emotionally Abused: No    Physically Abused: No    Sexually Abused: No    Outpatient Medications Prior to Visit  Medication Sig Dispense Refill   acetaminophen  (TYLENOL ) 500 MG tablet Take 1,000 mg by mouth every 6 (six) hours as needed for mild pain (pain score 1-3) or moderate pain (pain score 4-6).     amLODipine  (NORVASC ) 5 MG tablet TAKE 1 TABLET BY MOUTH DAILY 90 tablet 3   calcium  carbonate (TUMS EX) 750 MG chewable tablet Chew 750-1,500 mg by mouth 4 (four) times daily as needed for heartburn.     carbidopa -levodopa  (SINEMET  IR) 25-100 MG tablet For breakthrough restless leg, you can take 1 tablet at bedtime.  OK to take extra tab as needed during the day. 100 tablet 3   Cholecalciferol  1000 UNITS tablet Take 1,000 Units by mouth in the morning, at noon, and at bedtime.     Cyanocobalamin  (VITAMIN B-12) 1000 MCG SUBL DISSOLVE 2 TABLETS IN MOUTH EVERY DAY 180 tablet 1   doxycycline  (VIBRA -TABS) 100 MG tablet Take 1 tablet (100 mg total) by mouth 2 (two) times daily. 20 tablet 0   finasteride  (PROSCAR ) 5 MG tablet TAKE 1 TABLET (5 MG TOTAL) BY MOUTH DAILY. 90 tablet 1   furosemide  (LASIX ) 20 MG tablet Take 1 tablet (20  mg total) by mouth as needed for edema or fluid. FOR 3 POUND WEIGHT GAIN OVER NIGHT AND 5 POUND WEIGHT GAIN IN A WEEK 90 tablet 1   gabapentin  (NEURONTIN ) 300 MG capsule Take 1 capsule (300 mg total) by mouth 3 (three) times daily. 270 capsule 2   ibuprofen (ADVIL) 200 MG tablet Take 400 mg by mouth every 6 (six) hours as needed for mild pain (pain score 1-3) or moderate pain (pain score 4-6).     magnesium  hydroxide (MILK OF MAGNESIA) 400 MG/5ML suspension Take 15 mLs by mouth daily as needed for mild constipation.     Multiple Vitamins-Minerals (PRESERVISION AREDS PO) Take 1 tablet by mouth 2 (two) times daily.     mupirocin  ointment (BACTROBAN ) 2 % Apply 1 Application topically 2 (two) times daily. 30 g 2   ondansetron  (ZOFRAN ) 4 MG tablet Take 1 tablet (4 mg total) by mouth every 6 (six) hours as needed for nausea or vomiting. 30 tablet 0   Oxycodone  HCl 10 MG TABS Take 1 tablet (10 mg total) by mouth 5 (five) times daily for 5 days. 25 tablet 0   Polyethyl Glycol-Propyl Glycol (SYSTANE OP) Place 1 drop into both eyes 2 (two) times daily as needed (dry eyes). [facility]     pravastatin  (PRAVACHOL ) 20 MG tablet TAKE 1 TABLET (20 MG TOTAL) BY MOUTH DAILY. 90 tablet 1   rOPINIRole  (REQUIP ) 2 MG tablet TAKE 1 TABLET BY MOUTH AT AT NOON AND 1 TAB AT 4PM AND 1 TAB AT BEDTIME 270 tablet 2   tamsulosin  (FLOMAX ) 0.4 MG CAPS capsule TAKE 1 CAPSULE (0.4 MG TOTAL) BY MOUTH IN THE MORNING. 90 capsule 1   terazosin  (HYTRIN ) 2 MG capsule TAKE 1 CAPSULE BY MOUTH  EVERY NIGHT AT BEDTIME 90 capsule 1   No facility-administered medications prior to visit.    Allergies  Allergen Reactions   Levofloxacin Other (See Comments)    REACTION: hands pealed    Review of Systems  Constitutional:  Positive for chills and malaise/fatigue. Negative for fever.  Respiratory:  Negative for shortness of breath.   Cardiovascular:  Negative for chest pain, palpitations and leg swelling.  Gastrointestinal:  Negative for  abdominal pain, constipation, diarrhea, nausea and vomiting.  Genitourinary:  Negative for dysuria, frequency and urgency.  Musculoskeletal:  Positive for myalgias. Negative for falls.       LLE pain  Neurological:  Negative for dizziness.       Objective:    Physical Exam Constitutional:      General: He is not in acute distress.    Appearance: He is ill-appearing.  Eyes:     Extraocular Movements: Extraocular movements intact.     Conjunctiva/sclera: Conjunctivae normal.  Cardiovascular:     Rate and Rhythm: Normal rate.  Pulmonary:     Effort: Pulmonary effort is normal.  Chest:     Chest wall: Tenderness present.     Comments: TTP to left anterior lower ribs Musculoskeletal:     Cervical back: Normal range of motion and neck supple.     Left lower leg: Tenderness present. Pitting Edema present.     Comments: LLE with significant erythema and edema, and TTP   Skin:    General: Skin is warm and dry.  Neurological:     General: No focal deficit present.     Mental Status: He is alert and oriented to person, place, and time.  Psychiatric:        Mood and Affect: Mood normal.        Behavior: Behavior normal.        Thought Content: Thought content normal.      BP 110/60 (BP Location: Right Arm, Patient Position: Sitting)   Pulse 67   Temp 97.8 F (36.6 C) (Temporal)   Ht 5' 9 (1.753 m)   Wt 150 lb (68 kg)   SpO2 99%   BMI 22.15 kg/m  Wt Readings from Last 3 Encounters:  05/26/24 150 lb (68 kg)  05/15/24 149 lb 8 oz (67.8 kg)  04/17/24 152 lb 8 oz (69.2 kg)        Assessment & Plan:   Problem List Items Addressed This Visit     Cannot walk   Cellulitis of left leg - Primary   Fatigue   MDS (myelodysplastic syndrome) (HCC)   Rib pain on right side   Other Visit Diagnoses       Edema of left lower leg           Assessment and Plan Assessment & Plan Cellulitis of left lower extremity in setting of myelodysplastic syndrome Recurrent cellulitis  of the left lower extremity with significant swelling, redness, and pain, exacerbated by walking. Concern for potential progression to sepsis due to underlying myelodysplastic syndrome and chronic cytopenias. Previous hospitalization in August for cellulitis treated with IV antibiotics. Current presentation is more severe than previous episodes.  - Referred to emergency department for evaluation and management.   Myelodysplastic syndrome with chronic cytopenias Chronic condition with low white blood cell count and platelets, increasing risk for infections and bleeding. Recent platelet transfusion due to low platelet count. Regular treatments every two weeks for hemoglobin, platelets, and white blood cell count. - Continue regular treatments every two  weeks for hemoglobin, platelets, and white blood cell count.  Right rib pain and tenderness Stabbing pain over right ribcage, exacerbated by movement. Tenderness over the rib area. Possibly related to physical activity and caregiving responsibilities but no known injury.      I am having Justin ORN. Randine [name]. maintain his Cholecalciferol , Multiple Vitamins-Minerals (PRESERVISION AREDS PO), carbidopa -levodopa , Polyethyl Glycol-Propyl Glycol (SYSTANE OP), magnesium  hydroxide, Vitamin B-12, calcium  carbonate, finasteride , pravastatin , acetaminophen , ibuprofen, Oxycodone  HCl, ondansetron , tamsulosin , furosemide , doxycycline , mupirocin  ointment, amLODipine , terazosin , gabapentin , and rOPINIRole .  No orders of the defined types were placed in this encounter.

## 2024-05-26 NOTE — ED Provider Notes (Signed)
 [place] EMERGENCY DEPARTMENT AT [facility] Provider Note   CSN: [id number] Arrival date & time: 05/26/24  1138     Patient presents with: Leg Swelling, Cellulitis, and Chest Pain   Justin [name]. is a 83 y.o. male.  Patient is a 83 year old male with a history of myelodysplastic syndrome, BPH, CAD, GERD who presents to the ED for increasing left lower leg swelling for 4 days.  Patient notes that over the weekend the leg became much more red and swollen.  He states Saturday he was not feeling well but denies fevers.  Patient notes he has had issues with cellulitis in this leg previously with last time being August 2025.  Denies antibiotic use recently.  He is also noting pain on the right side of the ribs and feels that he may have cracked a rib.  Denies fall or injury.  States he has never had a blood clot previously and is not on blood thinners due to underlying myelodysplastic syndrome.  Denies fever/chills, headache, dizziness, chest pain, shortness of breath, dyspnea,  abdominal pain, nausea/vomit/diarrhea.  Chest Pain Associated symptoms: no fever and no shortness of breath        Prior to Admission medications   Medication Sig Start Date End Date Taking? Authorizing Provider  acetaminophen  (TYLENOL ) 500 MG tablet Take 1,000 mg by mouth every 6 (six) hours as needed for mild pain (pain score 1-3) or moderate pain (pain score 4-6).    [provider]  amLODipine  (NORVASC ) 5 MG tablet TAKE 1 TABLET BY MOUTH DAILY 05/05/24   Plotnikov, Aleksei V, MD  calcium  carbonate (TUMS EX) 750 MG chewable tablet Chew 750-1,500 mg by mouth 4 (four) times daily as needed for heartburn.    [provider]  carbidopa -levodopa  (SINEMET  IR) 25-100 MG tablet For breakthrough restless leg, you can take 1 tablet at bedtime.  OK to take extra tab as needed during the day. 03/22/20   Patel, Donika K, DO  Cholecalciferol  1000 UNITS tablet Take 1,000 Units by mouth in the  morning, at noon, and at bedtime.    [provider]  Cyanocobalamin  (VITAMIN B-12) 1000 MCG SUBL DISSOLVE 2 TABLETS IN MOUTH EVERY DAY 05/10/21   Plotnikov, Aleksei V, MD  doxycycline  (VIBRA -TABS) 100 MG tablet Take 1 tablet (100 mg total) by mouth 2 (two) times daily. 04/28/24   Gershon Donnice SAUNDERS, DPM  finasteride  (PROSCAR ) 5 MG tablet TAKE 1 TABLET (5 MG TOTAL) BY MOUTH DAILY. 08/24/23   Plotnikov, Aleksei V, MD  furosemide  (LASIX ) 20 MG tablet Take 1 tablet (20 mg total) by mouth as needed for edema or fluid. FOR 3 POUND WEIGHT GAIN OVER NIGHT AND 5 POUND WEIGHT GAIN IN A WEEK 03/17/24   Loni Soyla LABOR, MD  gabapentin  (NEURONTIN ) 300 MG capsule Take 1 capsule (300 mg total) by mouth 3 (three) times daily. 05/19/24   Plotnikov, Aleksei V, MD  ibuprofen (ADVIL) 200 MG tablet Take 400 mg by mouth every 6 (six) hours as needed for mild pain (pain score 1-3) or moderate pain (pain score 4-6).    [provider]  magnesium  hydroxide (MILK OF MAGNESIA) 400 MG/5ML suspension Take 15 mLs by mouth daily as needed for mild constipation.    [provider]  Multiple Vitamins-Minerals (PRESERVISION AREDS PO) Take 1 tablet by mouth 2 (two) times daily.    [provider]  mupirocin  ointment (BACTROBAN ) 2 % Apply 1 Application topically 2 (two) times daily. 04/28/24   Gershon,  Donnice SAUNDERS, DPM  ondansetron  (ZOFRAN ) 4 MG tablet Take 1 tablet (4 mg total) by mouth every 6 (six) hours as needed for nausea or vomiting. 10/26/23   Laurence Locus, DO  Oxycodone  HCl 10 MG TABS Take 1 tablet (10 mg total) by mouth 5 (five) times daily for 5 days. 10/26/23 05/26/24  Laurence Locus, DO  Polyethyl Glycol-Propyl Glycol (SYSTANE OP) Place 1 drop into both eyes 2 (two) times daily as needed (dry eyes). [facility]    [provider]  pravastatin  (PRAVACHOL ) 20 MG tablet TAKE 1 TABLET (20 MG TOTAL) BY MOUTH DAILY. 08/24/23   Plotnikov, Aleksei V, MD  rOPINIRole  (REQUIP ) 2 MG tablet TAKE 1 TABLET BY  MOUTH AT AT NOON AND 1 TAB AT 4PM AND 1 TAB AT BEDTIME 05/19/24   Plotnikov, Aleksei V, MD  tamsulosin  (FLOMAX ) 0.4 MG CAPS capsule TAKE 1 CAPSULE (0.4 MG TOTAL) BY MOUTH IN THE MORNING. 11/22/23   Plotnikov, Aleksei V, MD  terazosin  (HYTRIN ) 2 MG capsule TAKE 1 CAPSULE BY MOUTH EVERY NIGHT AT BEDTIME 05/05/24   Plotnikov, Aleksei V, MD    Allergies: Levofloxacin    Review of Systems  Constitutional:  Negative for fever.  Respiratory:  Negative for shortness of breath.   Cardiovascular:  Positive for chest pain.  Skin:  Positive for color change.  All other systems reviewed and are negative.   Updated Vital Signs BP 138/69 (BP Location: Left Arm)   Pulse 78   Temp 98 F (36.7 C) (Oral)   Resp 16   Ht 5' 9 (1.753 m)   Wt 68 kg   SpO2 98%   BMI 22.15 kg/m   Physical Exam Constitutional:      Appearance: He is well-developed.  HENT:     Head: Normocephalic and atraumatic.  Cardiovascular:     Rate and Rhythm: Normal rate.  Pulmonary:     Effort: Pulmonary effort is normal.     Breath sounds: Normal breath sounds.  Musculoskeletal:        General: Normal range of motion.     Left lower leg: Tenderness present. Edema present.  Skin:    General: Skin is warm.     Comments: Edema and erythema noted to the left lower extremity.  Full range motion of the leg.  Mildly tender to palpation.  Increased cutaneous warmth.  PT pulses 2+ bilaterally  Neurological:     Mental Status: He is alert and oriented to person, place, and time.  Psychiatric:        Mood and Affect: Mood normal.        Behavior: Behavior normal.     (all labs ordered are listed, but only abnormal results are displayed) Labs Reviewed  COMPREHENSIVE METABOLIC PANEL WITH GFR  CBC WITH DIFFERENTIAL/PLATELET  LACTIC ACID, PLASMA  LACTIC ACID, PLASMA    EKG: None  Radiology: No results found.   Medications Ordered in the ED - No data to display  Clinical Course as of 05/26/24 1521  Mon May 26, 2024   1327 WBC: 10.0 Significant elevation from his baseline of 2-3 [AY]    Clinical Course User Index [AY] Neysa Thersia RAMAN, PA-C                                Medical Decision Making Amount and/or Complexity of Data Reviewed Labs: ordered. Decision-making details documented in ED Course. Radiology: ordered.  Risk Prescription drug management.  Decision regarding hospitalization.    Patient is an 83 year old male with a history of myelodysplastic syndrome who presents to the ED for increasing left lower leg pain and redness for 3 to 4 days.  Also notes right sided chest wall pain.  Please see detailed HPI above.  On exam patient is alert and in no acute distress.  Physical exam as noted above.  Please see media imaging for further characterization of cellulitis of the leg.  White count Noted to be a elevated for patient at 10, his normal is 2-3.  Lactic unremarkable.  X-ray of the left tib-fib shows diffuse subcu edema but no signs of osteomyelitis or free air.  CT of the chest for PE negative for acute process.  Suspect patient's pain is musculoskeletal in nature as pain is reproducible.  Ultrasound of the left leg negative for DVT.  As patient is immunocompromise in the setting of cellulitis of the leg, we do feel inpatient admission would be beneficial for IV antibiotics.  Blood cultures drawn and started on cefepime .  Hospitalist has been consulted and are agreeable to admit patient for further evaluation and management.  Patient stable while in ED.  Case discussed with attending who agrees with assessment, plan, admission today.  Final diagnoses:  None    ED Discharge Orders     None          Neysa Thersia RAMAN, [place] 05/26/24 1619    Laurice Maude BROCKS, MD 05/28/24 1155

## 2024-05-27 ENCOUNTER — Inpatient Hospital Stay (HOSPITAL_COMMUNITY)

## 2024-05-27 DIAGNOSIS — R609 Edema, unspecified: Secondary | ICD-10-CM | POA: Diagnosis not present

## 2024-05-27 LAB — CBC
HCT: 31.6 % — ABNORMAL LOW (ref 39.0–52.0)
Hemoglobin: 9.6 g/dL — ABNORMAL LOW (ref 13.0–17.0)
MCH: 23.7 pg — ABNORMAL LOW (ref 26.0–34.0)
MCHC: 30.4 g/dL (ref 30.0–36.0)
MCV: 78 fL — ABNORMAL LOW (ref 80.0–100.0)
Platelets: 31 K/uL — ABNORMAL LOW (ref 150–400)
RBC: 4.05 MIL/uL — ABNORMAL LOW (ref 4.22–5.81)
RDW: 17.1 % — ABNORMAL HIGH (ref 11.5–15.5)
WBC: 11.6 K/uL — ABNORMAL HIGH (ref 4.0–10.5)
nRBC: 0 % (ref 0.0–0.2)

## 2024-05-27 LAB — BASIC METABOLIC PANEL WITH GFR
Anion gap: 11 (ref 5–15)
BUN: 16 mg/dL (ref 8–23)
CO2: 21 mmol/L — ABNORMAL LOW (ref 22–32)
Calcium: 8.4 mg/dL — ABNORMAL LOW (ref 8.9–10.3)
Chloride: 105 mmol/L (ref 98–111)
Creatinine, Ser: 1.15 mg/dL (ref 0.61–1.24)
GFR, Estimated: >60 mL/min (ref >60)
Glucose, Bld: 102 mg/dL — ABNORMAL HIGH (ref 70–99)
Potassium: 3.3 mmol/L — ABNORMAL LOW (ref 3.5–5.1)
Sodium: 136 mmol/L (ref 135–145)

## 2024-05-27 MED ORDER — SODIUM CHLORIDE (PF) 0.9 % IJ SOLN
INTRAMUSCULAR | Status: AC
Start: 2024-05-27 — End: 2024-05-27
  Filled 2024-05-27: qty 50

## 2024-05-27 MED ORDER — ONDANSETRON HCL 4 MG PO TABS
4.0000 mg | ORAL_TABLET | Freq: Three times a day (TID) | ORAL | Status: DC | PRN
  Filled 2024-05-27: qty 1

## 2024-05-27 MED ORDER — POTASSIUM CHLORIDE CRYS ER 20 MEQ PO TBCR
40.0000 meq | EXTENDED_RELEASE_TABLET | Freq: Once | ORAL | Status: AC
  Administered 2024-05-27: 40 meq via ORAL
  Filled 2024-05-27: qty 2

## 2024-05-27 MED ORDER — AMLODIPINE BESYLATE 5 MG PO TABS
5.0000 mg | ORAL_TABLET | Freq: Every day | ORAL | Status: DC
  Administered 2024-05-27 – 2024-05-31 (×5): 5 mg via ORAL
  Filled 2024-05-27 (×6): qty 1

## 2024-05-27 MED ORDER — IPRATROPIUM-ALBUTEROL 0.5-2.5 (3) MG/3ML IN SOLN
3.0000 mL | RESPIRATORY_TRACT | Status: DC | PRN
  Filled 2024-05-27 (×6): qty 3

## 2024-05-27 MED ORDER — GLUCAGON HCL RDNA (DIAGNOSTIC) 1 MG IJ SOLR: 1.0000 mg | INTRAMUSCULAR | Status: DC | PRN

## 2024-05-27 MED ORDER — ROPINIROLE HCL 1 MG PO TABS
2.0000 mg | ORAL_TABLET | Freq: Two times a day (BID) | ORAL | Status: DC | PRN
  Administered 2024-05-27 – 2024-06-01 (×8): 2 mg via ORAL
  Filled 2024-05-27 (×20): qty 2

## 2024-05-27 MED ORDER — ROPINIROLE HCL 1 MG PO TABS
2.0000 mg | ORAL_TABLET | Freq: Once | ORAL | Status: AC
  Administered 2024-05-27: 2 mg via ORAL
  Filled 2024-05-27: qty 2

## 2024-05-27 MED ORDER — CEFAZOLIN SODIUM-DEXTROSE 2-4 GM/100ML-% IV SOLN
2.0000 g | Freq: Three times a day (TID) | INTRAVENOUS | Status: DC
  Administered 2024-05-27 – 2024-06-01 (×15): 2 g via INTRAVENOUS
  Filled 2024-05-27 (×19): qty 100

## 2024-05-27 MED ORDER — HYDRALAZINE HCL 20 MG/ML IJ SOLN: 10.0000 mg | INTRAMUSCULAR | Status: DC | PRN

## 2024-05-27 MED ORDER — GABAPENTIN 300 MG PO CAPS
300.0000 mg | ORAL_CAPSULE | Freq: Once | ORAL | Status: AC
  Administered 2024-05-27: 300 mg via ORAL
  Filled 2024-05-27: qty 1

## 2024-05-27 MED ORDER — ACETAMINOPHEN 325 MG PO TABS
650.0000 mg | ORAL_TABLET | Freq: Four times a day (QID) | ORAL | Status: DC | PRN
  Administered 2024-05-27 – 2024-06-01 (×13): 650 mg via ORAL
  Filled 2024-05-27 (×26): qty 2

## 2024-05-27 MED ORDER — IOHEXOL 300 MG/ML  SOLN
100.0000 mL | Freq: Once | INTRAMUSCULAR | Status: AC | PRN
  Administered 2024-05-27: 100 mL via INTRAVENOUS

## 2024-05-27 MED ORDER — OXYCODONE HCL 10 MG PO TABS: 10.0000 mg | ORAL_TABLET | ORAL | 0 refills | Status: DC | PRN

## 2024-05-27 MED ORDER — GABAPENTIN 300 MG PO CAPS
300.0000 mg | ORAL_CAPSULE | Freq: Three times a day (TID) | ORAL | Status: DC
  Administered 2024-05-27 – 2024-06-01 (×15): 300 mg via ORAL
  Filled 2024-05-27 (×18): qty 1

## 2024-05-27 MED ORDER — OXYCODONE HCL 5 MG PO TABS
10.0000 mg | ORAL_TABLET | ORAL | Status: DC | PRN
  Administered 2024-05-27 – 2024-06-01 (×16): 10 mg via ORAL
  Filled 2024-05-27 (×40): qty 2

## 2024-05-27 MED ORDER — CARBIDOPA-LEVODOPA 25-100 MG PO TABS
1.0000 | ORAL_TABLET | Freq: Every day | ORAL | Status: DC
  Administered 2024-05-27 – 2024-05-31 (×5): 1 via ORAL
  Filled 2024-05-27 (×6): qty 1

## 2024-05-27 NOTE — Plan of Care (Signed)
  Problem: Clinical Measurements: Goal: Respiratory complications will improve Outcome: Progressing Goal: Cardiovascular complication will be avoided Outcome: Progressing   Problem: Education: Goal: Knowledge of General Education information will improve Description: Including pain rating scale, medication(s)/side effects and non-pharmacologic comfort measures Outcome: Not Progressing   Problem: Health Behavior/Discharge Planning: Goal: Ability to manage health-related needs will improve Outcome: Not Progressing   Problem: Clinical Measurements: Goal: Ability to maintain clinical measurements within normal limits will improve Outcome: Not Progressing Goal: Will remain free from infection Outcome: Not Progressing Goal: Diagnostic test results will improve Outcome: Not Progressing   

## 2024-05-27 NOTE — Progress Notes (Signed)
 Arrived to patient in the hospital room    the pre admit assesment was done at that time    he is alert and oriented   he is excited to get to go home today   he answered all of the questions that were asked   the program was explained to him and his daughter    we have all paperwork and loaded him into the wheel chair for transport    arrived to his home   daughter met us  there   he lives with his wife and son   daughter lives close by    all equipment was set up and tried out   he is content with the program and being home    vitals are stable   weight is his normal weight    skin assesment was done with RN   photos were taken and sent by roover to chart   leg was bandaged by RN    it is explained about the medication box    he is told how to use the tablet to call the nurse when he needs anything

## 2024-05-27 NOTE — Evaluation (Signed)
 Physical Therapy Evaluation Patient Details Name: Justin [name]. MRN: [id number] DOB: 09-08-1940 Today's Date: 05/27/2024  History of Present Illness  83 yo male presents to therapy following hospital admission on 05/26/2024 due to L LE cellulitis worsening over the past 4 days. Pt negative for B LE DVT and PE. Pt started on ABX. Pt PMH includes but is not limited to: HTN, CAD, MDS, diverticulitis, GERD, glaucoma, TIA, LBP, DDD, BPH, macular degeneration, substance abuse, L elbow fx and subsequent surgeries, and R TKA.  Clinical Impression    Pt admitted with above diagnosis.  Pt currently with functional limitations due to the deficits listed below (see PT Problem List). Pt in bed when PT arrived. Pt noted to have L LE elevated with rubor, calor and edema evident. Pt reports limited pain at rest however increasing to 6/10 with LLE WB. Pt able to provide insight to PLOF reports caring for spouse in home setting and will have strong family support upon d/c home and will have assist for spouse as well. Pt is mod I for bed mobility and transfer tasks, gait tasks 120 feet with RW and close S min cues for posture and RW management, pt noted to have L hand and back deformity. Pt to be transported to CT and seated in wc with hospital staff. Pt will benefit from acute skilled PT to increase their independence and safety with mobility to allow discharge.         If plan is discharge home, recommend the following: A little help with walking and/or transfers;A little help with bathing/dressing/bathroom;Assistance with cooking/housework;Assist for transportation;Help with stairs or ramp for entrance   Can travel by private vehicle        Equipment Recommendations None recommended by PT  Recommendations for Other Services       Functional Status Assessment Patient has had a recent decline in their functional status and demonstrates the ability to make significant improvements in function in a  reasonable and predictable amount of time.     Precautions / Restrictions Precautions Precautions: Fall Restrictions Weight Bearing Restrictions Per Provider Order: No      Mobility  Bed Mobility Overal bed mobility: Modified Independent             General bed mobility comments: no cues HOB slightly elevated    Transfers Overall transfer level: Modified independent                 General transfer comment: sit to stand from EOB to RW no cues    Ambulation/Gait Ambulation/Gait assistance: Supervision Gait Distance (Feet): 120 Feet Assistive device: Rolling walker (2 wheels) Gait Pattern/deviations: Step-through pattern, Trunk flexed, Antalgic, Decreased stance time - left Gait velocity: decreased     General Gait Details: trunk flexion and slight L lateral lean, pt self limting L LE WB due to pain, min cues for Rw management and safety.  Stairs            Wheelchair Mobility     Tilt Bed    Modified Rankin (Stroke Patients Only)       Balance Overall balance assessment: Mild deficits observed, not formally tested                                           Pertinent Vitals/Pain Pain Assessment Pain Assessment: 0-10 Pain Score: 6  Pain Location: L LE Pain  Descriptors / Indicators: Burning, Constant, Discomfort, Grimacing Pain Intervention(s): Limited activity within patient's tolerance, Monitored during session    Home Living Family/patient expects to be discharged to:: Private residence Living Arrangements: Spouse/significant other;Children (spouse and son) Available Help at Discharge: Family;Available PRN/intermittently Type of Home: House Home Access: Stairs to enter Entrance Stairs-Rails:  (can reach door frame) Entrance Stairs-Number of Steps: 1 +1. over thersholds   Home Layout: One level Home Equipment: Cane - single Librarian, Academic (2 wheels);Grab bars - tub/shower;BSC/3in1;Lift chair;Rollator (4  wheels) Additional Comments: folding chair as shower seat    Prior Function Prior Level of Function : Independent/Modified Independent;Driving             Mobility Comments: pt reports amb with rollator in home and community, pt is mod I for ADLs, self care tasks, IADLs and driving ADLs Comments: Ind; takes care of spouse (help her get off toilet, pericare, etc)     Extremity/Trunk Assessment   Upper Extremity Assessment Upper Extremity Assessment:  (L hand thumb deformity and 4th and 5th ray flexion contractures attributed to mulitiple L UE surgeries impacting pt L UE grasp and functional ROM and strength)    Lower Extremity Assessment Lower Extremity Assessment: Overall WFL for tasks assessed (L LE pain reported, noted edema, rubor and callor attributed to distal L LE cellulitis)    Cervical / Trunk Assessment Cervical / Trunk Assessment: Other exceptions (scoliosis hx of LBP)  Communication   Communication Communication: No apparent difficulties    Cognition Arousal: Alert Behavior During Therapy: WFL for tasks assessed/performed   PT - Cognitive impairments: No apparent impairments                         Following commands: Intact       Cueing       General Comments      Exercises     Assessment/Plan    PT Assessment Patient needs continued PT services  PT Problem List Decreased activity tolerance;Decreased balance;Decreased mobility;Decreased coordination;Pain       PT Treatment Interventions DME instruction;Gait training;Stair training;Functional mobility training;Therapeutic activities;Therapeutic exercise;Balance training;Neuromuscular re-education;Patient/family education    PT Goals (Current goals can be found in the Care Plan section)  Acute Rehab PT Goals Patient Stated Goal: to be able to go home PT Goal Formulation: With patient Time For Goal Achievement: 06/10/24 Potential to Achieve Goals: Good    Frequency Min 3X/week      Co-evaluation               AM-PAC PT 6 Clicks Mobility  Outcome Measure Help needed turning from your back to your side while in a flat bed without using bedrails?: None Help needed moving from lying on your back to sitting on the side of a flat bed without using bedrails?: None Help needed moving to and from a bed to a chair (including a wheelchair)?: None Help needed standing up from a chair using your arms (e.g., wheelchair or bedside chair)?: None Help needed to walk in hospital room?: A Little Help needed climbing 3-5 steps with a railing? : A Lot 6 Click Score: 21    End of Session Equipment Utilized During Treatment: Gait belt Activity Tolerance: Patient limited by pain Patient left: Other (comment) (in transport wc to CT scan with hospital staff) Nurse Communication: Mobility status PT Visit Diagnosis: Unsteadiness on feet (R26.81);Other abnormalities of gait and mobility (R26.89);Difficulty in walking, not elsewhere classified (R26.2);Pain Pain - Right/Left: Left Pain -  part of body: Leg    Time: 1040-1052 PT Time Calculation (min) (ACUTE ONLY): 12 min   Charges:   PT Evaluation $PT Eval Low Complexity: 1 Low   PT General Charges $$ ACUTE PT VISIT: 1 Visit         Justin, PT Acute Rehab   Justin [name] 05/27/2024, 11:03 AM

## 2024-05-27 NOTE — Progress Notes (Signed)
 Phone called completed with patient. Introduced myself as the [employer] for the evening. Patient states that he's doing well and watching a ball game. Patient denies any pain or discomfort at this time. Plan made for bedtime medication pass; patient to call RN via tablet when he is ready. Patient also reminded of the plan for medic to come to home later tonight to administer scheduled IV antibiotic.

## 2024-05-27 NOTE — Plan of Care (Incomplete Revision)
 Hospital at Home Interim Note     [name].   FMW:[id number]  DOB: 1941-04-20  DOA: 05/26/2024     1 Date of Service: 05/27/2024    HPI:  Justin [name]. is an 83 y.o. male with medical history significant for hypertension, CAD, remote alcoholism, MDS, and recurrent skin and soft tissue infections who presents with redness, pain, and swelling involving the lower left leg.   Patient noticed increased left lower leg swelling, redness, and pain roughly 4 days ago.  There has been progressive worsening since then and he has developed increased chills and general malaise.   ED Course: Upon arrival to the ED, patient is found to be afebrile and saturating well on room air with normal HR and stable BP.  Labs are most notable for creatinine 1.38, normal WBC, normal lactic acid, hemoglobin 10.4, and platelets 37,000.  There is no DVT on the left lower extremity venous Doppler.  CTA chest is negative for PE or other acute finding.  There is diffuse subcutaneous edema without fracture or dislocation on plain radiographs of the left tib/fib.   Blood cultures were collected in the ED and the patient was treated with cefepime .   Subjective:  Noted mild improvement in LLE cellultis s/p course of IV cefazolin  overnight. Case discussed w/ Dr. Dennise with ID who is recommending IV cefazolin  pending negative blood cultures over the next 48-72 hours.  Lengthy discussion at the bedside with patient as well as daughter. They are both in agreement for the hospital at home program. Pt has strong family support with spouse and children living with family. Daughter is also available to care for patient as needed. Consent obtained at the bedside.   Hospital Problems Assessment and Plan: Left lower leg cellulitis without purulent discharge History of recurrent skin infections -Noted prior history of LLE cellulitis 02/2024 as well as left elbow infection with removal of hardware, MSSA bacteremia and  epidural abscess  - Significantly improving on cefazolin .  Daughter is present at bedside.  Less pain -Wil continue with IV cefazolin   -Will plan to transition to oral abx vs. Dalvivancin once blood cultures are negative x 48-72 hours.    MDS  - Followed by Dr. Federico and treated with Retacrit  every 2 weeks  - Counts appear stable   History of ETOH abuse  Pt report prior use 30+ years ago  Daughter reports no active use over 30 years.  Monitor   HTN BP stable  Titrate home regimen         Objective Vitals:   05/27/24 0332 05/27/24 0452 05/27/24 1357 05/27/24 1407  BP: (!) 151/68  113/75 113/75  Pulse: 80  64 64  Temp: 100.2 F (37.9 C) 99.5 F (37.5 C) (!) 97.4 F (36.3 C) (!) 97.4 F (36.3 C)  Resp: 18  20 16   Height:      Weight:      SpO2: 96%  100% 100%  TempSrc: Oral Oral Oral Oral  BMI (Calculated):         Exam Physical Exam Constitutional:      Appearance: He is normal weight.  HENT:     Head: Normocephalic and atraumatic.     Nose: Nose normal.  Eyes:     Pupils: Pupils are equal, round, and reactive to light.  Cardiovascular:     Rate and Rhythm: Normal rate and regular rhythm.  Pulmonary:     Effort: Pulmonary effort is normal.  Musculoskeletal:  General: Normal range of motion.  Skin:    Comments: See picture    Neurological:     General: No focal deficit present.  Psychiatric:        Mood and Affect: Mood normal.          Labs / Other Information There are no new results to review at this time.  CT TIBIA FIBULA LEFT W CONTRAST EXAM: CT LEFT LOWER EXTREMITY, WITH IV CONTRAST 05/27/2024 11:24:59 AM  TECHNIQUE: Axial images were acquired through the left lower extremity with IV contrast. Reformatted images were reviewed. Automated exposure control, iterative reconstruction, and/or weight based adjustment of the mA/kV was utilized to reduce the radiation dose to as low as reasonably  achievable.  COMPARISON: Radiographs 05/26/2024.  CLINICAL HISTORY: Soft tissue infection suspected, lower leg.  FINDINGS:  BONES AND JOINTS: No acute fracture or focal osseous lesion. No bony destructive findings characteristic of osteomyelitis. No dislocation. The joint spaces are normal.  SOFT TISSUES: Circumferential subcutaneous edema in the calf especially distally and extending to overlying the medial and lateral malleoli. Edema tracks along the superficial fascial margin of the medial head left gastrocnemius but no drainable fluid collection is identified. Suspected cutaneous blistering medially along the distal calf about 8.5 cm proximal to the tibiotalar joint. Mild cutaneous irregularity just above this level medially as on image 264 series 10.  IMPRESSION: 1. Circumferential subcutaneous edema in the left calf, especially distally, with suspected medial distal calf cutaneous blistering; no drainable fluid collection identified.  Electronically signed by: Ryan Salvage MD 05/27/2024 11:39 AM EST RP Workstation: [id number]  Lab Results  Component Value Date   WBC 11.6 (H) 05/27/2024   HGB 9.6 (L) 05/27/2024   HCT 31.6 (L) 05/27/2024   MCV 78.0 (L) 05/27/2024   PLT 31 (L) 05/27/2024   Last metabolic panel Lab Results  Component Value Date   GLUCOSE 102 (H) 05/27/2024   NA 136 05/27/2024   K 3.3 (L) 05/27/2024   CL 105 05/27/2024   CO2 21 (L) 05/27/2024   BUN 16 05/27/2024   CREATININE 1.15 05/27/2024   GFRNONAA >60 05/27/2024   CALCIUM  8.4 (L) 05/27/2024   PHOS 3.9 10/24/2023   PROT 7.3 05/26/2024   ALBUMIN  4.0 05/26/2024   LABGLOB 2.7 12/07/2021   BILITOT 0.8 05/26/2024   ALKPHOS 75 05/26/2024   AST 24 05/26/2024   ALT 16 05/26/2024   ANIONGAP 11 05/27/2024     Hospital at Home Admission Criteria Checklist:  Formal consent explained in detail and signed at the bedside: yes Patient meets inpatient admission criteria (see below for further  details) yes Is pt Medicare FFS/[employer] Medicare-Medicaid, [employer], [employer] ( required for initial launch with plan to expand)? yes Lives within 25 mil/ 30 min from [facility] within [place](pt may stay with family member during admission who lives within 25 miles or 30 min from [facility] w/in [facility])? yes Hemodynamically stable with relatively low risk of clinical deterioration-not requiring ICU? not applicable Age >55? yes Does not require frequent touch-points or complex interventions/medications (ie Titrated Infusions (IV insulin , heparin  drips, vasoactive drips, use of infused or injectable controlled substances, patients on insulin )? no Any Behavioral Health comorbidities likely to increase risk for in-home care (ie Acute delirium or experiencing a marked altered mental status and cause is not a treatable condition in the home)? no Has the patient been on BIPAP during course of ED evaluation or hospitalization? no IF YES, Has the patient been off of BIPAP for >24  hours(If NO-THEN PATIENT DOES MEET INCLUSION CRITERIA)? not applicable On Room Air or Needs oxygen at home (<6L)? is not on home oxygen therapy. Active safety concerns (ie Unable to use bedside commode independently and lacks caregiver support for safety- needs SNF placement, unable to obtain IV access)? no Has skin check been performed? yes  Has Physical Therapy screened the patient? yes  Common admission diagnoses including: CAP, COPD Exacerbation, Acute on chronic heart failure, Cellulitis, UTI , dehydration, acute resp failure with hypoxia (requiring <5L)   Social Screening:  - Has the family been directly contacted about Hospital at Home program with consent obtained (if yes- please document who was spoken to with name and phone number)? yes  -Was the family approached about the use of TOC pharmacy for medications at discharge? no Denies significant ETOH intake? yes Does not smoke and understands may not smoke in  the presence of oxygen? yes Patient states able to use iPad/phone for communication/has family who is able to use? yes Patient has agreed to be compliant with medication and treatment regimen of the program? yes Any active drug use in patient or primary caregiver including daily dosing of methadone? no Stable home environment ( access to appropriate heating in cold conditions and/or appropriate air conditioning in hot conditions and/or no running water /electricity)? yes  No aggressive pets at home? no Firearm present? not applicable  With ability or willingness to store them unloaded in a locked case for duration of hospitalization? not applicable Ambulatory? yes  mild difficulty Bed bugs present on home evaluation? not applicable Family support system in place? yes Patient feels safe at home and does not endorse any violence? not applicable Any actively decompensated behavioral health issues including agitation/aggressive behavior? no  Patient requests food to be provided by hospital home program? no PT/OT eval completed and not requiring SNF, ALF, inpatient rehab? yes  To be admitted to the Hospital at [facility] program, a patient generally must meet the following: 1. Requirement for Inpatient Level of Care: The patient's condition must necessitate an inpatient level of care. This is typically indicated by one or more of the following, depending on their specific diagnosis:  Persistent tachycardia despite appropriate treatment (e.g., for Heart Failure, UTI). Persistent tachypnea (rapid breathing) or dyspnea (shortness of breath) that hasn't improved sufficiently with observation care (e.g., for Heart Failure, Pneumonia, Viral Illness, COVID). Hypoxemia (low oxygen levels), such as a new need for oxygen, an increased need from baseline, or specific oxygen saturation levels (e.g., SpO2 <90-94% depending on the condition) that persist despite observation (e.g., for Heart Failure, COPD, Pneumonia, Viral  Illness, COVID). Need for Intravenous (IV) hydration due to an inability to maintain oral hydration, which persists despite observation care (e.g., for Cellulitis, UTI, Viral Illness, COVID). Specific to Heart Failure: Persistent pulmonary edema, indicated by a new oxygen need, lack of improvement with IV diuretics, and ongoing tachypnea/dyspnea. Specific to COPD: A decrease in known baseline resting oxygen saturation (SpO2) by 4% or more, or an increase in pre-existing supplemental oxygen requirements, which persists despite observation and requires continued close monitoring. Specific to Pneumonia: A Pneumonia Severity Index (PSI) class IV (moderate risk). Specific to Cellulitis: Failure of outpatient antibiotic therapy (indicated by progression or no improvement after a minimum of 48 hours on an adequate regimen) or a clinical presentation (like acuity or rapidity of progression) that requires the intensity of monitoring found in an inpatient setting. Specific to UTI: Persistence or worsening of clinical findings like fever, pain, or dehydration despite observation  care; presence of significant uropathy; suspected infection of an indwelling prosthetic device, stent, implant, or graft; or pregnancy with suspected pyelonephritis.  2. Appropriateness for Hospital at Home Setting: The patient's overall clinical picture, including the severity of their illness, their care needs, and their medical history and comorbidities, must be suitable for management in the Hospital at Home environment. This essentially means that none of the exclusion criteria (listed below) are met.  Unified Exclusion Criteria for Hospital at Home Admission: A patient would not be eligible for Hospital at Home if any of the following are present: Hemodynamic Instability: Hypotension (low blood pressure) is present. Respiratory Instability or Needs Beyond Program Capability: There is a new need for invasive or noninvasive  ventilatory assistance (like BiPAP or a ventilator). Oxygenation is not sufficient, generally indicated if an FiO2 (fraction of inspired oxygen) of 45% (which is about 6 Liters/minute via nasal cannula) or more is required to keep oxygen saturation (SpO2) at 90% or greater. Monitoring or Procedural Needs Beyond Program Capability: There is a need for invasive monitoring, such as a pulmonary artery catheter or an arterial line. There is a need for immediate-response telemetry monitoring (for dangerous arrhythmia detection and subsequent immediate intervention). The required medication regimen is beyond the capabilities of Hospital at Home (e.g., dosing intervals are too frequent for home administration). There is a need for a procedure that cannot be performed by the [facility] at [facility] team (e.g., significant wound debridement or abscess drainage for cellulitis, or percutaneous nephrostomy for a complicated UTI). Significant Organ Dysfunction or Markers of Severe Illness: Mental status is not at baseline, or there is altered mental status suggestive of inadequate perfusion. Renal (kidney) function is unstable or showing an ongoing decline. There is evidence of inadequate perfusion, such as metabolic acidosis or myocardial ischemia. Uncompensated acidosis is present. Condition-Specific Severity or Complications Making Home Care Unsuitable: For Heart Failure: Known severe cardiac valvular disease (e.g., aortic stenosis, mitral regurgitation); or severe peripheral edema that impairs the ability to urinate or ambulate. For COPD: Known concurrent comorbidity or finding that indicates a higher-risk COPD exacerbation (e.g., pulmonary fibrosis, cavitation, pleural effusion, pneumothorax, rib fracture). For Pneumonia: Pneumonia Severity Index (PSI) class V (indicating high risk for inpatient mortality); known concurrent comorbidity or finding that indicates higher-risk pneumonia (e.g., pulmonary fibrosis,  cavitation, large or loculated pleural effusion); or a concomitant serious infectious process like endocarditis or empyema. For Cellulitis: Orbital, periorbital, or necrotizing infection is suspected; or a concomitant serious infectious process like endocarditis, septic emboli, or septic joint space infection. For UTI: Urinary tract obstruction (e.g., kidney stone, bladder outlet obstruction); or a concomitant serious infectious process like endocarditis or septic emboli. For Viral Illness & COVID-19: A concomitant serious infectious process like endocarditis or empyema.  General Comorbidities or Status:  The patient is significantly immunosuppressed (this applies to Pneumonia, Cellulitis, UTI, Viral Illness, and COVID-19). The patient meets inpatient admission criteria for a second diagnosis, or has care needs beyond the capabilities of Hospital at Home due to an active clinically significant comorbidity. (This is a general exclusion across all listed conditions)   Time spent: >60 minutes   [facility] 05/27/2024, 3:32 PM

## 2024-05-27 NOTE — Hospital Course (Addendum)
 Justin [name]. is an 83 y.o. male with medical history significant for hypertension, CAD, remote alcoholism, MDS, and recurrent skin and soft tissue infections who presented on 05/26/2024 for evaluation of redness, pain, and swelling involving the lower left leg, present and progressively worsening over 4 days.  He also developed increased chills and general malaise.   ED Course: Upon arrival to the ED, patient afebrile and saturating well on room air with normal HR and stable BP.  Labs are most notable for creatinine 1.38, normal WBC, normal lactic acid, hemoglobin 10.4, and platelets 37,000.  There is no DVT on the left lower extremity venous Doppler.  CTA chest is negative for PE or other acute finding.  There is diffuse subcutaneous edema without fracture or dislocation on plain radiographs of the left tib/fib.   Blood cultures were collected in the ED and the patient was treated with cefepime .  11/24 -- admitted, continued on IV cefazolin   11/25 -- continued on cefazolin .  Leg with some purulent drainage  Transferred to Hospital at Home program.  11/26 -- doing well at home, wound on lower leg draining some.  Overall slowly improving. Continued on cefazolin .  11/27 --persistent erythema and swelling with associated pain.  Erythema appears to be an evolving and darkening related to venous stasis.  Infectious disease reviewed and made recommendations as below.  11/28 -- erythema persistent but differential warmth resolved, swelling improving.  Wound cx few staph aureus. Added PO Zynox given evening visit Medic observed some extension beyond ink margin.    11/29 -- improvement in erythema and swelling today. Wound culture pending.

## 2024-05-27 NOTE — TOC CM/SW Note (Signed)
 CM attempted to reach patient at [phone] for initial assessment. No answer and vm is currently full.   CM to follow up tomorrow for assessment and d/c planning.   Merilee Batty, MSN, RN Case Management [phone]

## 2024-05-27 NOTE — Progress Notes (Signed)
 PROGRESS NOTE    Justin [name].  FMW:[id number] DOB: October 09, 1940 DOA: 05/26/2024 PCP: Justin Karlynn GAILS, MD    Brief Narrative:   83 year old with history of HTN, CAD, remote alcoholism, MDS, recurrent skin and soft tissue infection presented with left lower extremity cellulitis worsening over the last 4 days.  Lower extremity Dopplers and CTA chest negative for pulmonary embolism started on IV cefazolin .  Will transition patient to [name] at home.  Discussed with patient and family  Assessment & Plan:   Left lower leg cellulitis without purulent discharge - Significantly improving on cefazolin .  Daughter is present at bedside.  Less pain   MDS  - Followed by Dr. Federico and treated with Retacrit  every 2 weeks  - Counts appear stable   DVT prophylaxis: SCDs Start: 05/26/24 1703    Code Status: Full Code Family Communication:   Status is: Inpatient Remains inpatient appropriate because: doing better.  Transition to [name] at home   PT Follow up Recs:   Subjective:  Left lower extremity cellulitis is better.  Slowly continues to improve.  Agreeable to transition to [name] at home  Examination:  General exam: Appears calm and comfortable  Respiratory system: Clear to auscultation. Respiratory effort normal. Cardiovascular system: S1 & S2 heard, RRR. No JVD, murmurs, rubs, gallops or clicks. No pedal edema. Gastrointestinal system: Abdomen is nondistended, soft and nontender. No organomegaly or masses felt. Normal bowel sounds heard. Central nervous system: Alert and oriented. No focal neurological deficits. Extremities: Symmetric 5 x 5 power. Skin: Of lower extremity cellulitis is improving, less swollen today.  No open wounds Psychiatry: Judgement and insight appear normal. Mood & affect appropriate.                Diet Orders (From admission, onward)     Start     Ordered   05/26/24 1703  Diet regular Room service appropriate? Yes; Fluid  consistency: Thin  Diet effective now       Question Answer Comment  Room service appropriate? Yes   Fluid consistency: Thin      05/26/24 1703            Objective: Vitals:   05/26/24 1710 05/26/24 2051 05/27/24 0332 05/27/24 0452  BP: 126/68 (!) 132/56 (!) 151/68   Pulse: 84 63 80   Resp: 16 18 18    Temp: 98.5 F (36.9 C) 98.6 F (37 C) 100.2 F (37.9 C) 99.5 F (37.5 C)  TempSrc: Oral Oral Oral Oral  SpO2: 97% 98% 96%   Weight:      Height:        Intake/Output Summary (Last 24 hours) at 05/27/2024 0955 Last data filed at 05/27/2024 0410 Gross per 24 hour  Intake 251.72 ml  Output 500 ml  Net -248.28 ml   Filed Weights   05/26/24 1151  Weight: 68 kg    Scheduled Meds:  sodium chloride  flush  3 mL Intravenous Q12H   Continuous Infusions:   ceFAZolin  (ANCEF ) IV 2 g (05/27/24 0459)    Nutritional status     Body mass index is 22.15 kg/m.  Data Reviewed:   CBC: Recent Labs  Lab 05/26/24 1215 05/27/24 0556  WBC 10.0 11.6*  NEUTROABS 7.0  --   HGB 10.4* 9.6*  HCT 34.4* 31.6*  MCV 78.0* 78.0*  PLT 37* 31*   Basic Metabolic Panel: Recent Labs  Lab 05/26/24 1215 05/27/24 0556  NA 138 136  K 3.7 3.3*  CL 104 105  CO2  23 21*  GLUCOSE 106* 102*  BUN 26* 16  CREATININE 1.38* 1.15  CALCIUM  9.4 8.4*   GFR: Estimated Creatinine Clearance: 45.2 mL/min (by C-G formula based on SCr of 1.15 mg/dL). Liver Function Tests: Recent Labs  Lab 05/26/24 1215  AST 24  ALT 16  ALKPHOS 75  BILITOT 0.8  PROT 7.3  ALBUMIN  4.0   No results for input(s): LIPASE, AMYLASE in the last 168 hours. No results for input(s): AMMONIA in the last 168 hours. Coagulation Profile: No results for input(s): INR, PROTIME in the last 168 hours. Cardiac Enzymes: No results for input(s): CKTOTAL, CKMB, CKMBINDEX, TROPONINI in the last 168 hours. BNP (last 3 results) No results for input(s): PROBNP in the last 8760 hours. HbA1C: No results for  input(s): HGBA1C in the last 72 hours. CBG: No results for input(s): GLUCAP in the last 168 hours. Lipid Profile: No results for input(s): CHOL, HDL, LDLCALC, TRIG, CHOLHDL, LDLDIRECT in the last 72 hours. Thyroid  Function Tests: No results for input(s): TSH, T4TOTAL, FREET4, T3FREE, THYROIDAB in the last 72 hours. Anemia Panel: No results for input(s): VITAMINB12, FOLATE, FERRITIN, TIBC, IRON, RETICCTPCT in the last 72 hours. Sepsis Labs: Recent Labs  Lab 05/26/24 1215  LATICACIDVEN 1.0    Recent Results (from the past 240 hours)  Culture, blood (routine x 2)     Status: None (Preliminary result)   Collection Time: 05/26/24  4:30 PM   Specimen: BLOOD RIGHT ARM  Result Value Ref Range Status   Specimen Description   Final    BLOOD RIGHT ARM Performed at [facility], [address] [place], [place] [address]    Special Requests   Final    BOTTLES DRAWN AEROBIC AND ANAEROBIC Blood Culture adequate volume Performed at [facility], [address] [place], [place] [address]    Culture   Final    NO GROWTH < 12 HOURS Performed at [name], [address] [place], [place] [address]    Report Status PENDING  Incomplete  Culture, blood (routine x 2)     Status: None (Preliminary result)   Collection Time: 05/26/24  5:55 PM   Specimen: BLOOD RIGHT HAND  Result Value Ref Range Status   Specimen Description   Final    BLOOD RIGHT HAND Performed at [facility], [address], [place], [place] [address]    Special Requests   Final    BOTTLES DRAWN AEROBIC AND ANAEROBIC Blood Culture adequate volume Performed at [facility], [address] [place], [place] [address]    Culture   Final    NO GROWTH < 12 HOURS Performed at [name], [address] [place], [place] [address]    Report Status PENDING  Incomplete         Radiology Studies: VAS US  LOWER EXTREMITY  VENOUS (DVT) (ONLY [facility] & [facility]) Result Date: 05/26/2024  Lower Venous DVT Study Patient Name:  Justin [name].  Date of Exam:   05/26/2024 Medical Rec #: [id number]               Accession #:    [id number] Date of Birth: [birth year]               Patient Gender: M Patient Age:   39 years Exam Location:  [name] Procedure:      VAS US  LOWER EXTREMITY VENOUS (DVT) Referring Phys: ALEXIS YOUNG --------------------------------------------------------------------------------  Indications: Pain,  Swelling, and Erythema. Other Indications: Cellulitis. Risk Factors: Myelodysplastic syndrome. Limitations: Poor ultrasound/tissue interface. Comparison Study: Previous exam on 11/15/2023 was negative for DVT Performing Technologist: Alberta Lis RVS  Examination Guidelines: A complete evaluation includes B-mode imaging, spectral Doppler, color Doppler, and power Doppler as needed of all accessible portions of each vessel. Bilateral testing is considered an integral part of a complete examination. Limited examinations for reoccurring indications may be performed as noted. The reflux portion of the exam is performed with the patient in reverse Trendelenburg.  +-----+---------------+---------+-----------+----------+--------------+ RIGHTCompressibilityPhasicitySpontaneityPropertiesThrombus Aging +-----+---------------+---------+-----------+----------+--------------+ CFV  Full           Yes      Yes                                 +-----+---------------+---------+-----------+----------+--------------+   +---------+---------------+---------+-----------+----------+-------------------+ LEFT     CompressibilityPhasicitySpontaneityPropertiesThrombus Aging      +---------+---------------+---------+-----------+----------+-------------------+ CFV      Full           Yes      Yes                                      +---------+---------------+---------+-----------+----------+-------------------+ SFJ       Full                                                             +---------+---------------+---------+-----------+----------+-------------------+ FV Prox  Full                                                             +---------+---------------+---------+-----------+----------+-------------------+ FV Mid   Full                                                             +---------+---------------+---------+-----------+----------+-------------------+ FV DistalFull                                                             +---------+---------------+---------+-----------+----------+-------------------+ PFV      Full                                                             +---------+---------------+---------+-----------+----------+-------------------+ POP      Full           Yes      Yes                                      +---------+---------------+---------+-----------+----------+-------------------+  PTV      Full                                                             +---------+---------------+---------+-----------+----------+-------------------+ PERO                                                  Not well visualized +---------+---------------+---------+-----------+----------+-------------------+     Summary: RIGHT: - No evidence of common femoral vein obstruction.   LEFT: - There is no evidence of deep vein thrombosis in the lower extremity.  - No cystic structure found in the popliteal fossa. Subcutaneous in area of calf.  *See table(s) above for measurements and observations. Electronically signed by Lonni Gaskins MD on 05/26/2024 at 5:00:15 PM.    Final    CT Angio Chest PE W and/or Wo Contrast Result Date: 05/26/2024 CLINICAL DATA:  Left leg swelling and pain, myelodysplastic syndrome, right rib pain EXAM: CT ANGIOGRAPHY CHEST WITH CONTRAST TECHNIQUE: Multidetector CT imaging of the chest was performed using the standard protocol  during bolus administration of intravenous contrast. Multiplanar CT image reconstructions and MIPs were obtained to evaluate the vascular anatomy. RADIATION DOSE REDUCTION: This exam was performed according to the departmental dose-optimization program which includes automated exposure control, adjustment of the mA and/or kV according to patient size and/or use of iterative reconstruction technique. CONTRAST:  75mL OMNIPAQUE  IOHEXOL  350 MG/ML SOLN COMPARISON:  10/28/2022, 07/23/2018 FINDINGS: Cardiovascular: This is a technically adequate evaluation of the pulmonary vasculature. No filling defects or pulmonary emboli. The heart is unremarkable without pericardial effusion. No evidence of thoracic aortic aneurysm or dissection. Atherosclerosis of the aorta and coronary vasculature. Mediastinum/Nodes: No enlarged mediastinal, hilar, or axillary lymph nodes. Thyroid  gland, trachea, and esophagus demonstrate no significant findings. Lungs/Pleura: No acute airspace disease, effusion, or pneumothorax. Central airways are patent. Upper Abdomen: No acute abnormality. Musculoskeletal: Chronic healing left posterior ninth through eleventh rib fractures. No acute or destructive bony abnormalities. Reconstructed images demonstrate no additional findings. Review of the MIP images confirms the above findings. IMPRESSION: 1. No evidence of pulmonary embolus. 2. No acute intrathoracic process. 3. Aortic Atherosclerosis (ICD10-I70.0). Coronary artery atherosclerosis. Electronically Signed   By: Ozell Daring M.D.   On: 05/26/2024 15:03   DG Tibia/Fibula Left Result Date: 05/26/2024 CLINICAL DATA:  Cellulitis. EXAM: LEFT TIBIA AND FIBULA - 2 VIEW COMPARISON:  None Available. FINDINGS: No acute fracture or dislocation. The bones are mildly osteopenic. There is diffuse subcutaneous edema which may represent cellulitis. No opaque foreign object or soft tissue gas. IMPRESSION: 1. No acute fracture or dislocation. 2. Diffuse  subcutaneous edema. Electronically Signed   By: Vanetta Chou M.D.   On: 05/26/2024 13:39           LOS: 1 day   Time spent= 35 mins    Burgess JAYSON Dare, MD [facility]  If 7PM-7AM, please contact night-coverage  05/27/2024, 9:55 AM

## 2024-05-27 NOTE — Plan of Care (Addendum)
 Hospital at Home Interim Note     Americus Perkey Randine Raddle.   FMW:[id number]  DOB: August 03, 1940  DOA: 05/26/2024     1 Date of Service: 05/27/2024    HPI:  Justin [name]. is an 83 y.o. male with medical history significant for hypertension, CAD, remote alcoholism, MDS, and recurrent skin and soft tissue infections who presents with redness, pain, and swelling involving the lower left leg.   Patient noticed increased left lower leg swelling, redness, and pain roughly 4 days ago.  There has been progressive worsening since then and he has developed increased chills and general malaise.   ED Course: Upon arrival to the ED, patient is found to be afebrile and saturating well on room air with normal HR and stable BP.  Labs are most notable for creatinine 1.38, normal WBC, normal lactic acid, hemoglobin 10.4, and platelets 37,000.  There is no DVT on the left lower extremity venous Doppler.  CTA chest is negative for PE or other acute finding.  There is diffuse subcutaneous edema without fracture or dislocation on plain radiographs of the left tib/fib.   Blood cultures were collected in the ED and the patient was treated with cefepime .   Subjective:  Noted mild improvement in LLE cellultis s/p course of IV cefazolin  overnight. Case discussed w/ Dr. Dennise with ID who is recommending IV cefazolin  pending negative blood cultures over the next 48-72 hours.  Lengthy discussion at the bedside with patient as well as daughter. They are both in agreement for the hospital at home program. Pt has strong family support with spouse and children living with family. Daughter is also available to care for patient as needed. Consent obtained at the bedside.   Hospital Problems Assessment and Plan: Left lower leg cellulitis without purulent discharge History of recurrent skin infections -Noted prior history of LLE cellulitis 02/2024 as well as left elbow infection with removal of hardware, MSSA bacteremia and  epidural abscess  - Significantly improving on cefazolin .  Daughter is present at bedside.  Less pain -Wil continue with IV cefazolin   -Will plan to transition to oral abx vs. Dalvivancin once blood cultures are negative x 48-72 hours.    MDS  - Followed by Dr. Federico and treated with Retacrit  every 2 weeks  - Counts appear stable   History of ETOH abuse  Pt report prior use 30+ years ago  Daughter reports no active use over 30 years.  Monitor   HTN BP stable  Titrate home regimen         Objective Vitals:   05/27/24 0332 05/27/24 0452 05/27/24 1357 05/27/24 1407  BP: (!) 151/68  113/75 113/75  Pulse: 80  64 64  Temp: 100.2 F (37.9 C) 99.5 F (37.5 C) (!) 97.4 F (36.3 C) (!) 97.4 F (36.3 C)  Resp: 18  20 16   Height:      Weight:      SpO2: 96%  100% 100%  TempSrc: Oral Oral Oral Oral  BMI (Calculated):         Exam Physical Exam Constitutional:      Appearance: He is normal weight.  HENT:     Head: Normocephalic and atraumatic.     Nose: Nose normal.  Eyes:     Pupils: Pupils are equal, round, and reactive to light.  Cardiovascular:     Rate and Rhythm: Normal rate and regular rhythm.  Pulmonary:     Effort: Pulmonary effort is normal.  Musculoskeletal:  General: Normal range of motion.  Skin:    Comments: See picture    Neurological:     General: No focal deficit present.  Psychiatric:        Mood and Affect: Mood normal.          Labs / Other Information There are no new results to review at this time.  CT TIBIA FIBULA LEFT W CONTRAST EXAM: CT LEFT LOWER EXTREMITY, WITH IV CONTRAST 05/27/2024 11:24:59 AM  TECHNIQUE: Axial images were acquired through the left lower extremity with IV contrast. Reformatted images were reviewed. Automated exposure control, iterative reconstruction, and/or weight based adjustment of the mA/kV was utilized to reduce the radiation dose to as low as reasonably  achievable.  COMPARISON: Radiographs 05/26/2024.  CLINICAL HISTORY: Soft tissue infection suspected, lower leg.  FINDINGS:  BONES AND JOINTS: No acute fracture or focal osseous lesion. No bony destructive findings characteristic of osteomyelitis. No dislocation. The joint spaces are normal.  SOFT TISSUES: Circumferential subcutaneous edema in the calf especially distally and extending to overlying the medial and lateral malleoli. Edema tracks along the superficial fascial margin of the medial head left gastrocnemius but no drainable fluid collection is identified. Suspected cutaneous blistering medially along the distal calf about 8.5 cm proximal to the tibiotalar joint. Mild cutaneous irregularity just above this level medially as on image 264 series 10.  IMPRESSION: 1. Circumferential subcutaneous edema in the left calf, especially distally, with suspected medial distal calf cutaneous blistering; no drainable fluid collection identified.  Electronically signed by: Ryan Salvage MD 05/27/2024 11:39 AM EST RP Workstation: [id number]  Lab Results  Component Value Date   WBC 11.6 (H) 05/27/2024   HGB 9.6 (L) 05/27/2024   HCT 31.6 (L) 05/27/2024   MCV 78.0 (L) 05/27/2024   PLT 31 (L) 05/27/2024   Last metabolic panel Lab Results  Component Value Date   GLUCOSE 102 (H) 05/27/2024   NA 136 05/27/2024   K 3.3 (L) 05/27/2024   CL 105 05/27/2024   CO2 21 (L) 05/27/2024   BUN 16 05/27/2024   CREATININE 1.15 05/27/2024   GFRNONAA >60 05/27/2024   CALCIUM  8.4 (L) 05/27/2024   PHOS 3.9 10/24/2023   PROT 7.3 05/26/2024   ALBUMIN  4.0 05/26/2024   LABGLOB 2.7 12/07/2021   BILITOT 0.8 05/26/2024   ALKPHOS 75 05/26/2024   AST 24 05/26/2024   ALT 16 05/26/2024   ANIONGAP 11 05/27/2024     Hospital at Home Admission Criteria Checklist:  Formal consent explained in detail and signed at the bedside: yes Patient meets inpatient admission criteria (see below for further  details) yes Is pt Medicare FFS/[employer] Medicare-Medicaid, [employer], [employer] ( required for initial launch with plan to expand)? yes Lives within 25 mil/ 30 min from [facility] within [place](pt may stay with family member during admission who lives within 25 miles or 30 min from [facility] w/in [facility])? yes Hemodynamically stable with relatively low risk of clinical deterioration-not requiring ICU? not applicable Age >55? yes Does not require frequent touch-points or complex interventions/medications (ie Titrated Infusions (IV insulin , heparin  drips, vasoactive drips, use of infused or injectable controlled substances, patients on insulin )? no Any Behavioral Health comorbidities likely to increase risk for in-home care (ie Acute delirium or experiencing a marked altered mental status and cause is not a treatable condition in the home)? no Has the patient been on BIPAP during course of ED evaluation or hospitalization? no IF YES, Has the patient been off of BIPAP for >24  hours(If NO-THEN PATIENT DOES MEET INCLUSION CRITERIA)? not applicable On Room Air or Needs oxygen at home (<6L)? is not on home oxygen therapy. Active safety concerns (ie Unable to use bedside commode independently and lacks caregiver support for safety- needs SNF placement, unable to obtain IV access)? no Has skin check been performed? yes  Has Physical Therapy screened the patient? yes  Common admission diagnoses including: CAP, COPD Exacerbation, Acute on chronic heart failure, Cellulitis, UTI , dehydration, acute resp failure with hypoxia (requiring <5L)   Social Screening:  - Has the family been directly contacted about Hospital at Home program with consent obtained (if yes- please document who was spoken to with name and phone number)? yes  -Was the family approached about the use of TOC pharmacy for medications at discharge? no Denies significant ETOH intake? yes Does not smoke and understands may not smoke in  the presence of oxygen? yes Patient states able to use iPad/phone for communication/has family who is able to use? yes Patient has agreed to be compliant with medication and treatment regimen of the program? yes Any active drug use in patient or primary caregiver including daily dosing of methadone? no Stable home environment ( access to appropriate heating in cold conditions and/or appropriate air conditioning in hot conditions and/or no running water /electricity)? yes  No aggressive pets at home? no Firearm present? not applicable  With ability or willingness to store them unloaded in a locked case for duration of hospitalization? not applicable Ambulatory? yes  mild difficulty Bed bugs present on home evaluation? not applicable Family support system in place? yes Patient feels safe at home and does not endorse any violence? not applicable Any actively decompensated behavioral health issues including agitation/aggressive behavior? no  Patient requests food to be provided by hospital home program? no PT/OT eval completed and not requiring SNF, ALF, inpatient rehab? yes  To be admitted to the Hospital at [facility] program, a patient generally must meet the following: 1. Requirement for Inpatient Level of Care: The patient's condition must necessitate an inpatient level of care. This is typically indicated by one or more of the following, depending on their specific diagnosis:  Persistent tachycardia despite appropriate treatment (e.g., for Heart Failure, UTI). Persistent tachypnea (rapid breathing) or dyspnea (shortness of breath) that hasn't improved sufficiently with observation care (e.g., for Heart Failure, Pneumonia, Viral Illness, COVID). Hypoxemia (low oxygen levels), such as a new need for oxygen, an increased need from baseline, or specific oxygen saturation levels (e.g., SpO2 <90-94% depending on the condition) that persist despite observation (e.g., for Heart Failure, COPD, Pneumonia, Viral  Illness, COVID). Need for Intravenous (IV) hydration due to an inability to maintain oral hydration, which persists despite observation care (e.g., for Cellulitis, UTI, Viral Illness, COVID). Specific to Heart Failure: Persistent pulmonary edema, indicated by a new oxygen need, lack of improvement with IV diuretics, and ongoing tachypnea/dyspnea. Specific to COPD: A decrease in known baseline resting oxygen saturation (SpO2) by 4% or more, or an increase in pre-existing supplemental oxygen requirements, which persists despite observation and requires continued close monitoring. Specific to Pneumonia: A Pneumonia Severity Index (PSI) class IV (moderate risk). Specific to Cellulitis: Failure of outpatient antibiotic therapy (indicated by progression or no improvement after a minimum of 48 hours on an adequate regimen) or a clinical presentation (like acuity or rapidity of progression) that requires the intensity of monitoring found in an inpatient setting. Specific to UTI: Persistence or worsening of clinical findings like fever, pain, or dehydration despite observation  care; presence of significant uropathy; suspected infection of an indwelling prosthetic device, stent, implant, or graft; or pregnancy with suspected pyelonephritis.  2. Appropriateness for Hospital at Home Setting: The patient's overall clinical picture, including the severity of their illness, their care needs, and their medical history and comorbidities, must be suitable for management in the Hospital at Home environment. This essentially means that none of the exclusion criteria (listed below) are met.  Unified Exclusion Criteria for Hospital at Home Admission: A patient would not be eligible for Hospital at Home if any of the following are present: Hemodynamic Instability: Hypotension (low blood pressure) is present. Respiratory Instability or Needs Beyond Program Capability: There is a new need for invasive or noninvasive  ventilatory assistance (like BiPAP or a ventilator). Oxygenation is not sufficient, generally indicated if an FiO2 (fraction of inspired oxygen) of 45% (which is about 6 Liters/minute via nasal cannula) or more is required to keep oxygen saturation (SpO2) at 90% or greater. Monitoring or Procedural Needs Beyond Program Capability: There is a need for invasive monitoring, such as a pulmonary artery catheter or an arterial line. There is a need for immediate-response telemetry monitoring (for dangerous arrhythmia detection and subsequent immediate intervention). The required medication regimen is beyond the capabilities of Hospital at Home (e.g., dosing intervals are too frequent for home administration). There is a need for a procedure that cannot be performed by the Hospital at [facility] team (e.g., significant wound debridement or abscess drainage for cellulitis, or percutaneous nephrostomy for a complicated UTI). Significant Organ Dysfunction or Markers of Severe Illness: Mental status is not at baseline, or there is altered mental status suggestive of inadequate perfusion. Renal (kidney) function is unstable or showing an ongoing decline. There is evidence of inadequate perfusion, such as metabolic acidosis or myocardial ischemia. Uncompensated acidosis is present. Condition-Specific Severity or Complications Making Home Care Unsuitable: For Heart Failure: Known severe cardiac valvular disease (e.g., aortic stenosis, mitral regurgitation); or severe peripheral edema that impairs the ability to urinate or ambulate. For COPD: Known concurrent comorbidity or finding that indicates a higher-risk COPD exacerbation (e.g., pulmonary fibrosis, cavitation, pleural effusion, pneumothorax, rib fracture). For Pneumonia: Pneumonia Severity Index (PSI) class V (indicating high risk for inpatient mortality); known concurrent comorbidity or finding that indicates higher-risk pneumonia (e.g., pulmonary fibrosis,  cavitation, large or loculated pleural effusion); or a concomitant serious infectious process like endocarditis or empyema. For Cellulitis: Orbital, periorbital, or necrotizing infection is suspected; or a concomitant serious infectious process like endocarditis, septic emboli, or septic joint space infection. For UTI: Urinary tract obstruction (e.g., kidney stone, bladder outlet obstruction); or a concomitant serious infectious process like endocarditis or septic emboli. For Viral Illness & COVID-19: A concomitant serious infectious process like endocarditis or empyema.  General Comorbidities or Status:  The patient is significantly immunosuppressed (this applies to Pneumonia, Cellulitis, UTI, Viral Illness, and COVID-19). The patient meets inpatient admission criteria for a second diagnosis, or has care needs beyond the capabilities of Hospital at Home due to an active clinically significant comorbidity. (This is a general exclusion across all listed conditions)   Time spent: >60 minutes   [facility] 05/27/2024, 3:32 PM

## 2024-05-27 NOTE — Progress Notes (Signed)
 Patient transferred to the Hospital at [facility] program. Communicated with patient via video tablet. AAOx4. Plan of care reviewed with patient and daughter. Patient was told that the virtual nurse is available 24/7. [facility] phone number given to patient. Tablet usage explained. Skin check completed with Barnie, paramedic. Patient agreeable with plan of care.

## 2024-05-27 NOTE — Plan of Care (Signed)
  Problem: Education: Goal: Knowledge of General Education information will improve Description: Including pain rating scale, medication(s)/side effects and non-pharmacologic comfort measures Outcome: Progressing   Problem: Health Behavior/Discharge Planning: Goal: Ability to manage health-related needs will improve Outcome: Progressing   Problem: Clinical Measurements: Goal: Respiratory complications will improve Outcome: Progressing Goal: Cardiovascular complication will be avoided Outcome: Progressing   Problem: Activity: Goal: Risk for activity intolerance will decrease Outcome: Progressing   Problem: Nutrition: Goal: Adequate nutrition will be maintained Outcome: Progressing   Problem: Coping: Goal: Level of anxiety will decrease Outcome: Progressing   Problem: Elimination: Goal: Will not experience complications related to bowel motility Outcome: Progressing Goal: Will not experience complications related to urinary retention Outcome: Progressing   Problem: Pain Managment: Goal: General experience of comfort will improve and/or be controlled Outcome: Progressing   Problem: Safety: Goal: Ability to remain free from injury will improve Outcome: Progressing   Problem: Skin Integrity: Goal: Risk for impaired skin integrity will decrease Outcome: Progressing   Problem: Clinical Measurements: Goal: Ability to avoid or minimize complications of infection will improve Outcome: Progressing   Problem: Skin Integrity: Goal: Skin integrity will improve Outcome: Progressing

## 2024-05-27 NOTE — Plan of Care (Addendum)
 1200: Patient agreed to join Hospital at [facility]. The admitting MD placed transfer orders. PIV to remain in for at home antibiotics. Awaiting for Paramedics to transport patient home.  1400: EMS at the bedside to transport patient home. PIV remains intact, Oxycodone  and antibiotics administered prior to discharge.  Problem: Education: Goal: Knowledge of General Education information will improve Description: Including pain rating scale, medication(s)/side effects and non-pharmacologic comfort measures Outcome: Progressing   Problem: Health Behavior/Discharge Planning: Goal: Ability to manage health-related needs will improve Outcome: Progressing   Problem: Clinical Measurements: Goal: Ability to maintain clinical measurements within normal limits will improve Outcome: Progressing Goal: Will remain free from infection Outcome: Progressing Goal: Diagnostic test results will improve Outcome: Progressing Goal: Respiratory complications will improve Outcome: Progressing Goal: Cardiovascular complication will be avoided Outcome: Progressing   Problem: Activity: Goal: Risk for activity intolerance will decrease Outcome: Progressing   Problem: Nutrition: Goal: Adequate nutrition will be maintained Outcome: Progressing   Problem: Coping: Goal: Level of anxiety will decrease Outcome: Progressing   Problem: Elimination: Goal: Will not experience complications related to bowel motility Outcome: Progressing Goal: Will not experience complications related to urinary retention Outcome: Progressing   Problem: Pain Managment: Goal: General experience of comfort will improve and/or be controlled Outcome: Progressing   Problem: Safety: Goal: Ability to remain free from injury will improve Outcome: Progressing   Problem: Skin Integrity: Goal: Risk for impaired skin integrity will decrease Outcome: Progressing   Problem: Clinical Measurements: Goal: Ability to avoid or  minimize complications of infection will improve Outcome: Progressing   Problem: Skin Integrity: Goal: Skin integrity will improve Outcome: Progressing

## 2024-05-27 NOTE — Progress Notes (Signed)
 Arrived to residence, Pt met us  at the door with his walker. Stated that he's still having a little pain but has been up and walking around more today. Vitals were assessed and documented, IV was assessed and flushed and antibiotics were started. Left leg was unwrapped and visualized with RN, Pitting edema noted in leg and foot. After antibiotics were finished the IV was flushed and capped. IV site was redressed due to blood on Tegaderm.

## 2024-05-28 LAB — BASIC METABOLIC PANEL WITH GFR
Anion gap: 11 (ref 5–15)
BUN: 14 mg/dL (ref 8–23)
CO2: 22 mmol/L (ref 22–32)
Calcium: 8.3 mg/dL — ABNORMAL LOW (ref 8.9–10.3)
Chloride: 103 mmol/L (ref 98–111)
Creatinine, Ser: 1.27 mg/dL — ABNORMAL HIGH (ref 0.61–1.24)
GFR, Estimated: 55 mL/min — ABNORMAL LOW (ref >60)
Glucose, Bld: 118 mg/dL — ABNORMAL HIGH (ref 70–99)
Potassium: 3.6 mmol/L (ref 3.5–5.1)
Sodium: 136 mmol/L (ref 135–145)

## 2024-05-28 LAB — CBC
HCT: 30.7 % — ABNORMAL LOW (ref 39.0–52.0)
Hemoglobin: 9.5 g/dL — ABNORMAL LOW (ref 13.0–17.0)
MCH: 23.3 pg — ABNORMAL LOW (ref 26.0–34.0)
MCHC: 30.9 g/dL (ref 30.0–36.0)
MCV: 75.2 fL — ABNORMAL LOW (ref 80.0–100.0)
Platelets: 44 K/uL — ABNORMAL LOW (ref 150–400)
RBC: 4.08 MIL/uL — ABNORMAL LOW (ref 4.22–5.81)
RDW: 17 % — ABNORMAL HIGH (ref 11.5–15.5)
WBC: 8.4 K/uL (ref 4.0–10.5)
nRBC: 0 % (ref 0.0–0.2)

## 2024-05-28 LAB — MAGNESIUM: Magnesium: 2 mg/dL (ref 1.7–2.4)

## 2024-05-28 MED ORDER — TERAZOSIN HCL 1 MG PO CAPS
2.0000 mg | ORAL_CAPSULE | Freq: Every day | ORAL | Status: DC
  Administered 2024-05-28 – 2024-05-31 (×4): 2 mg via ORAL
  Filled 2024-05-28 (×5): qty 2

## 2024-05-28 MED ORDER — PRAVASTATIN SODIUM 10 MG PO TABS
20.0000 mg | ORAL_TABLET | Freq: Every day | ORAL | Status: DC
  Administered 2024-05-29 – 2024-06-01 (×4): 20 mg via ORAL
  Filled 2024-05-28 (×5): qty 2

## 2024-05-28 MED ORDER — FINASTERIDE 5 MG PO TABS
5.0000 mg | ORAL_TABLET | Freq: Every day | ORAL | Status: DC
  Administered 2024-05-29 – 2024-06-01 (×4): 5 mg via ORAL
  Filled 2024-05-28 (×6): qty 1

## 2024-05-28 NOTE — Progress Notes (Addendum)
 2011-- Introductory contact call via phone. Patient identifiers reviewed. No other health concerns have been identified or reported. Patient updated medication plan for the night medication and for overnight monitoring. Patient A&O x4. Assessment completed. Patient informed of plans for paramedic visit. Patient agreed to video call later this evening. Patient currently denies pain. Patient encouraged to call for any needs. [facility] contact information reviewed.   2208--Outreach via video call. Unable to reach via video. Contact call via phone successful. Patient reported tablet is downstairs and request medication administration assistance upon paramedic arrival. Patient informed of paramedic  estimated arrival.   Inbound call received requesting additional update on ETA for visit. Patient reported being unable to open medications and difficulty with use of tablet. Client still requesting assistance upon paramedic arrival. Tablet alert addressed, client stable.   2330---Patient contact via Video call.  Current health data alert for help addressed.  patient sitting up on the sofa. Paramedic in visit with patient.  Patient reported pain at a level 4. Self-administration medication assistance provided for three medications with Psychiatric Nurse. Per Patient no other questions or concerns at the time of video call reported.  Patient informed will continue to be monitored overnight. encouraged to call as needed.

## 2024-05-28 NOTE — Assessment & Plan Note (Addendum)
 11/29--stable, no acute issues --Continue Requip , gabapentin , Sinemet  --Follow up with Neurology outpatient as scheudled

## 2024-05-28 NOTE — TOC Initial Note (Signed)
 Transition of Care (TOC) - Initial/Assessment Note    Patient Details  Name: Justin [name]. MRN: [id number] Date of Birth: [birth year]  Transition of Care [facility]) CM/SW Contact:    Rosalva Jon Bloch, RN Phone Number: 05/28/2024, 4:17 PM  Clinical Narrative:         LLE   Cellulitis        NCM called pt to discuss d/c planning. Pt ok with NCM speaking with daughter Landry about matter. Daughter states pt resides @ home with wife and adult son. Pt with supportive family. 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 Pt PTA independent with ADL's. DME:RW @ home.  NCM shared PT's recommendation for home health services. Pt agreeable. Pt without provider  preference. Home health referral faxed via  HUB.  Pt without RX med concerns or transportation issues.  Inpatient CM following and will assist with needs.....   Expected Discharge Plan: Home w Home Health Services Barriers to Discharge: Continued Medical Work up   Patient Goals and CMS Choice     Choice offered to / list presented to : Adult Children, Patient      Expected Discharge Plan and Services   Discharge Planning Services: CM Consult   Living arrangements for the past 2 months: Single Family Home                                      Prior Living Arrangements/Services Living arrangements for the past 2 months: Single Family Home Lives with:: Adult Children, Spouse Patient language and need for interpreter reviewed:: Yes        Need for Family Participation in Patient Care: Yes (Comment) Care giver support system in place?: Yes (comment)   Criminal Activity/Legal Involvement Pertinent to Current Situation/Hospitalization: No - Comment as needed  Activities of Daily Living   ADL Screening (condition at time of admission) Independently performs ADLs?: Yes (appropriate for developmental age) Is the patient deaf or have difficulty hearing?: No Does the patient have difficulty seeing, even when wearing  glasses/contacts?: No Does the patient have difficulty concentrating, remembering, or making decisions?: No  Permission Sought/Granted                  Emotional Assessment       Orientation: : Oriented to Self, Oriented to Place, Oriented to  Time, Oriented to Situation Alcohol  / Substance Use: Not Applicable Psych Involvement: No (comment)  Admission diagnosis:  Cellulitis of left lower extremity [L03.116] Left leg cellulitis [L03.116] Patient Active Problem List   Diagnosis Date Noted   Rib pain on right side 05/26/2024   Cannot walk 05/26/2024   Left leg cellulitis 05/26/2024   Effusion of bursa of elbow, left 04/04/2024   Bursitis of left elbow 02/13/2024   Cellulitis and abscess of left leg 02/08/2024   Cellulitis of left leg 02/07/2024   Intertrigo 12/16/2023   MSSA (methicillin susceptible Staphylococcus aureus) infection 12/11/2023   Swelling of lower leg 11/17/2023   PICC (peripherally inserted central catheter) in place 11/17/2023   Medication management 11/17/2023   Erysipelas of both lower extremities 11/12/2023   Urinary incontinence 11/12/2023   Septic bursitis of elbow 11/06/2023   Abscess of epidural space of spine due to bacteria 10/25/2023   Epidural abscess 10/25/2023   MSSA bacteremia 10/22/2023   Hypokalemia 10/21/2023   Acute on chronic pain of left elbow concerning for septic arthritis 10/21/2023   Spinal stenosis  of lumbar region 10/16/2023   Pseudophakia of both eyes 02/15/2022   MDS (myelodysplastic syndrome) (HCC) 12/25/2021   Abnormal EKG 11/18/2021   Thrombocytopenia 11/18/2021   Lumbar spondylosis 07/22/2021   Lumbar radiculopathy 03/24/2021   S/P total knee arthroplasty, right 11/09/2020   Weight loss 08/30/2020   Complication associated with orthopedic device 06/10/2020   Diastolic dysfunction 10/27/2019   Exudative age-related macular degeneration of left eye with active choroidal neovascularization (HCC) 10/15/2019    Intermediate stage nonexudative age-related macular degeneration of left eye 10/15/2019   Early stage nonexudative age-related macular degeneration of right eye 10/15/2019   Pseudophakia 10/15/2019   Posterior vitreous detachment of left eye 10/15/2019   GERD (gastroesophageal reflux disease) 11/13/2018   Coronary artery disease 10/24/2018   Claudication 02/22/2018   Anemia 08/28/2017   DOE (dyspnea on exertion) 07/31/2017   Fatigue 07/31/2017   Essential hypertension 11/29/2016   Smoking greater than 30 pack years 02/02/2016   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) 02/28/2013   Ingrowing toenail of left foot 08/29/2012   Allergic rhinitis 10/10/2011   Dupuytren's contracture of hand 07/05/2011   Vitamin B12 deficiency 12/23/2010   BPH (benign prostatic hyperplasia) 12/23/2010   Low back pain 07/27/2009   TOBACCO USE, QUIT 07/27/2009   Diverticulosis of colon 03/28/2009   RLS (restless legs syndrome) 10/28/2008   SKIN RASH, ALLERGIC 10/01/2008   Edema 10/01/2008   Neuropathy 04/22/2008   Vitamin D  deficiency 01/21/2008   INSOMNIA, CHRONIC 12/23/2007   Osteoarthritis 12/23/2007   CAROTID BRUIT, LEFT 12/23/2007   PCP:  Garald Karlynn GAILS, MD Pharmacy:   [id number] - [place], [place] - [address] [place] [address] Phone: [phone] Fax: [phone]     Social Drivers of Health (SDOH) Social History: SDOH Screenings   Food Insecurity: No Food Insecurity (05/27/2024)  Housing: Unknown (05/27/2024)  Transportation Needs: No Transportation Needs (05/27/2024)  Utilities: At Risk (05/27/2024)  Alcohol  Screen: Low Risk  (07/20/2023)  Depression (PHQ2-9): Low Risk  (05/15/2024)  Financial Resource Strain: Low Risk  (07/20/2023)  Physical Activity: Inactive (07/20/2023)  Social Connections: Moderately Isolated (05/27/2024)  Stress: No Stress Concern Present (07/20/2023)  Tobacco Use: Medium Risk (05/26/2024)  Health Literacy: Adequate Health Literacy  (07/20/2023)   SDOH Interventions:     Readmission Risk Interventions    02/08/2024    4:22 PM  Readmission Risk Prevention Plan  Post Dischage Appt Complete  Medication Screening Complete  Transportation Screening Complete

## 2024-05-28 NOTE — Progress Notes (Signed)
 H@H  medic Nell Gales out to see patient this evening for second assessment and second round of antibiotics. On arrival, pt welcomed me in and was seen sitting in his recliner. On assessment, lung sounds were clear and equal bilaterally. (L) lower leg was still reddened and wound was covered with mepilex that was placed there this morning. Pt had complaints of 2/10 (L) lower leg pain. Pain is currently being controlled by medication (See MAR). Antibiotics were given this visit. IV was maintained. No other complaints at this time. Pt was left in care of family. +

## 2024-05-28 NOTE — Plan of Care (Signed)

## 2024-05-28 NOTE — Progress Notes (Signed)
 Patient reports 6/10 left leg pain. Patient was witnessed taking the PRN oxycodone  and tylenol . LLE redness noted. No other complaints at this time. Plan for paramedic visit between 0830-0900 today.

## 2024-05-28 NOTE — Assessment & Plan Note (Signed)
 On home pain regimen

## 2024-05-28 NOTE — Assessment & Plan Note (Addendum)
 Follows with Dr. Federico, on Retacrit  every 2 weeks.  Has upcoming appt this week Friday.   11/29: WBC 4.1, hemoglobin 8.9, platelets 117 improved --Monitor CBC

## 2024-05-28 NOTE — Progress Notes (Signed)
 H@H  medics Esmirna Ravan and Lauraine arrived at patients residence this morning for visit. On arrival, crew was welcomed at the door by the patients so who then proceeded to take us  to the patient. The patient was seen sitting back in his recliner in no apparent distress. On assessment, lung sounds were clear and equal bilaterally, heart sounds regular S1, S2, abd soft non-tender x 4 quadrants with active bowel sounds. Pt states that his pain is a 3/10 which has decreased from a 7 - 8/10 from this morning. Pt self administered pain medication with RN supervision about an hour prior to arrival.  Morning medications were given as ordered and verified against the MAR.  IV antibiotics were started.  On inspection of wound (L) lower leg redness is still present circumferentially. Pictures were updated in the chart today. A quarter size wound is present on the lateral (L) lower leg that is weeping. The wound was cleaned and dressing was changed per wound care orders.   Labs were drawn as ordered. No other tasks were needed during this visit.

## 2024-05-28 NOTE — Progress Notes (Signed)
 Hospital at Home Daily Progress Note   Patient: [facility].  MRN: [id number]  DOB: Aug 05, 1940  DOA: 05/26/2024  DOS: the patient was seen and examined on @TODAY @    Patient identified themself as [name].  DOB [birth year]  Medic Tyrell Lewis present in the home during encounter and performed the assessment and physical exam. Patient was seen today via video conference; my physical location [facility] [place]   CC: left leg pain and swelling  HPI & Brief hospital course:  Antonio Creswell. is an 83 y.o. male with medical history significant for hypertension, CAD, remote alcoholism, MDS, and recurrent skin and soft tissue infections who presented on 05/26/2024 for evaluation of redness, pain, and swelling involving the lower left leg, present and progressively worsening over 4 days.  He also developed increased chills and general malaise.   ED Course: Upon arrival to the ED, patient afebrile and saturating well on room air with normal HR and stable BP.  Labs are most notable for creatinine 1.38, normal WBC, normal lactic acid, hemoglobin 10.4, and platelets 37,000.  There is no DVT on the left lower extremity venous Doppler.  CTA chest is negative for PE or other acute finding.  There is diffuse subcutaneous edema without fracture or dislocation on plain radiographs of the left tib/fib.   Blood cultures were collected in the ED and the patient was treated with cefepime .  11/24 -- admitted, continued on IV cefazolin   11/25 -- continued on cefazolin .  Leg with some purulent drainage  Transferred to [facility] program.  11/26 -- doing well at home, wound on lower leg draining some.  Overall slowly improving. Continued on cefazolin .   Assessment and Plan:  * Left leg cellulitis Slowly improving, continued drainage from wound on the leg.   --Appreciate wound care RN's recs --Continue IV cefazolin  --Wound care --Elevate leg when at rest --Recommend ABI's as  outpatient   MDS (myelodysplastic syndrome) (HCC) Follows with Dr. Federico, on Retacrit  every 2 weeks.  Has upcoming appt this week Friday.   --Monitor CBC  Essential hypertension Continue amlodipine  5 mg \  BPH (benign prostatic hyperplasia) Resume home terazosin , Proscar  and Flomax   Low back pain On home pain regimen  RLS (restless legs syndrome) Continue Requip , gabapentin , Sinemet  Follow up with Neurology outpatient as scheudled  Lumbar radiculopathy On home pain regimen     Patient Active Problem List   Diagnosis Date Noted   MSSA bacteremia 10/22/2023    Priority: High   Acute on chronic pain of left elbow concerning for septic arthritis 10/21/2023    Priority: High   Abscess of epidural space of spine due to bacteria 10/25/2023    Priority: Medium    Hypokalemia 10/21/2023    Priority: Low   MDS (myelodysplastic syndrome) (HCC) 12/25/2021    Priority: Low   GERD (gastroesophageal reflux disease) 11/13/2018    Priority: Low   Coronary artery disease 10/24/2018    Priority: Low   Essential hypertension 11/29/2016    Priority: Low   BPH (benign prostatic hyperplasia) 12/23/2010    Priority: Low   Low back pain 07/27/2009    Priority: Low   RLS (restless legs syndrome) 10/28/2008    Priority: Low   Rib pain on right side 05/26/2024   Cannot walk 05/26/2024   Left leg cellulitis 05/26/2024   Effusion of bursa of elbow, left 04/04/2024   Bursitis of left elbow 02/13/2024   Cellulitis and abscess of  left leg 02/08/2024   Cellulitis of left leg 02/07/2024   Intertrigo 12/16/2023   MSSA (methicillin susceptible Staphylococcus aureus) infection 12/11/2023   Swelling of lower leg 11/17/2023   PICC (peripherally inserted central catheter) in place 11/17/2023   Medication management 11/17/2023   Erysipelas of both lower extremities 11/12/2023   Urinary incontinence 11/12/2023   Septic bursitis of elbow 11/06/2023   Epidural abscess 10/25/2023   Spinal stenosis  of lumbar region 10/16/2023   Pseudophakia of both eyes 02/15/2022   Abnormal EKG 11/18/2021   Thrombocytopenia 11/18/2021   Lumbar spondylosis 07/22/2021   Lumbar radiculopathy 03/24/2021   S/P total knee arthroplasty, right 11/09/2020   Weight loss 08/30/2020   Complication associated with orthopedic device 06/10/2020   Diastolic dysfunction 10/27/2019   Exudative age-related macular degeneration of left eye with active choroidal neovascularization (HCC) 10/15/2019   Intermediate stage nonexudative age-related macular degeneration of left eye 10/15/2019   Early stage nonexudative age-related macular degeneration of right eye 10/15/2019   Pseudophakia 10/15/2019   Posterior vitreous detachment of left eye 10/15/2019   Claudication 02/22/2018   Anemia 08/28/2017   DOE (dyspnea on exertion) 07/31/2017   Fatigue 07/31/2017   Smoking greater than 30 pack years 02/02/2016   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) 02/28/2013   Ingrowing toenail of left foot 08/29/2012   Allergic rhinitis 10/10/2011   Dupuytren's contracture of hand 07/05/2011   Vitamin B12 deficiency 12/23/2010   TOBACCO USE, QUIT 07/27/2009   Diverticulosis of colon 03/28/2009   SKIN RASH, ALLERGIC 10/01/2008   Edema 10/01/2008   Neuropathy 04/22/2008   Vitamin D  deficiency 01/21/2008   INSOMNIA, CHRONIC 12/23/2007   Osteoarthritis 12/23/2007   CAROTID BRUIT, LEFT 12/23/2007      Significant Events: Admitted 05/26/2024 11/26 Transferred to Hospital [facility], remains on IV cefazolin     Subjective / Interval 24 hour History:   Pt seen virtually this AM during Medic home visit.  Pt reports feeling overall well, slept very well until 7 am today.  Has pain in the left leg with walking but okay at rest.  States discomfort from skin tightness with swelling.  No fever/chills or other acute complaints.  Asks if he can go to his daughter's house for Thanksgiving tomorrow.      Admission Labs: Normal CMP except glucose  121 CBC -- WBC 2.1 / Hbg 10.7 / Plt 19k Lactic acid 1.0  Admission Imaging Studies: X-ray Left Tib/fib  -- diffuse subcutaneous edema, no abscess or subcutaneous gas seen, no fracture or dislocation CTA chest -- no PE or other acute finding. Aortic atherosclerosis   Significant Labs: WBC improved 2.1 >> 10.0 >> 11.6 >> 8.4 Platelet improved 19 >> 37 >> 31 >> 44 K 3.3 on 11/25 >> 3.6 after replacement  Significant Imaging Studies: CT left tib / fib 11/25 -- circumferential subcutaneous edema of the left calf, worse distally with suspected medial aspect blistering, no drainable fluid collections seen  Antibiotic Therapy: Anti-infectives (From admission, onward)    Start     Dose/Rate Route Frequency Ordered Stop   05/27/24 1400  ceFAZolin  (ANCEF ) IVPB 2g/100 mL premix        2 g 200 mL/hr over 30 Minutes Intravenous Every 8 hours 05/27/24 1228     05/26/24 2300  ceFAZolin  (ANCEF ) IVPB 2g/100 mL premix  Status:  Discontinued        2 g 200 mL/hr over 30 Minutes Intravenous Every 8 hours 05/26/24 1703 05/27/24 1228   05/26/24 1530  ceFEPIme  (MAXIPIME )  2 g in sodium chloride  0.9 % 100 mL IVPB        2 g 200 mL/hr over 30 Minutes Intravenous  Once 05/26/24 1529 05/26/24 1707       Procedures: None  Consultants: Wound care RN          Physical Exam:    05/28/2024    4:30 PM 05/28/2024    8:00 AM 05/27/2024   10:19 PM  Vitals with BMI  Weight 153 lbs 1 oz    BMI 22.59    Systolic 123 123 890  Diastolic 70 75 64  Pulse 70 62 97      General exam: awake, alert, no acute distress HEENT: moist mucus membranes, hearing grossly normal  Respiratory system: CTAB, on room air, normal respiratory effort. Cardiovascular system: 3+ LLE edema Gastrointestinal system: soft, NT, ND, no HSM felt, +bowel sounds. Central nervous system: A&O x 3. no gross focal neurologic deficits, normal speech Extremities: left lower extremity photos below Skin: left lower leg weeping,  erythema and 3+ edema Psychiatry: normal mood, congruent affect, judgement and insight appear normal          Data Reviewed:  Notable labs today -- Glucose 118, Cr 1.27 stable, Ca 8.3, Platelets improved 44, Hbg 9.5   Family Communication: Present in the home during virtual encounter  Disposition: Status is: Inpatient Remains inpatient appropriate because: remains on IV antibiotics pending further improvement   Planned Discharge Destination: Home    Time spent: 45 minutes  Author: Burnard DELENA Cunning, DO [facility]  02/21/2024 7:00 AM  For on call review [email or web address].

## 2024-05-28 NOTE — Progress Notes (Signed)
 Arrived to pt room for transition to [facility] program. Pt was lying comfortably in bed with daughter at the bedside. Pt was getting dose of IV antibiotics before transport. The patient was excited and ready to go home. All pt information was verified by both the pt and the daughter. Vital signs were obtained on the pt at this time and were documented in the flowchart. Once the antibiotics had finished the pt was assisted out of bed. The pt then ambulated with minimal assistance to the [facility] wheelchair. The pt was then taken outside to the [facility] where he was properly restrained via safety belts and straps. He was then transported to his residence. Upon arrival to the home the pt was taken out of the [facility] and assisted ijn ambulating into his home. The pt did use his home rolater to ambulate. The pt then ambulated to his recliner chair. The pt's [facility] equipment was then set up and explained to both the pt and the daughter. The proper readings of the equipment were sent to virtual RN McWhite and all current health data was confirmed to be reading and sending to the virtual RN correctly.

## 2024-05-28 NOTE — Assessment & Plan Note (Addendum)
 11/29 - stable --On home pain regimen

## 2024-05-28 NOTE — Consult Note (Signed)
 WOC Nurse Consult Note: Reason for Consult: LLE cellulitis/ oozing wound Pictures under media tab  Wound type:chronic edema and hemosiderin staining of the LLE; reviewed images has been present for some time, however now patient presents with wound on the posterior calf that is draining.  Last ABIs were in 2019 that I can see. Would recommend new ABIs and compression for the LLE with Unna's boot. I will add topical care for now to address wound.  Pressure Injury POA: NA Measurement: see nursing flow sheets Wound bed: partial thickness; blistered  Drainage (amount, consistency, odor) serosanguinous  Periwound: edema and redness; chronic hemosiderin staining  Dressing procedure/placement/frequency: Cleanse LLE wound with saline, pat dry Cover with piece of silver hydrofiber Soila # [id number]) and top with foam.    If MD orders Unna's boots they can be applied directly over this dressing.   Re consult if needed, will not follow at this time. Thanks  Maizee [employer], RN,CWOCN, CNS, [employer] [phone]

## 2024-05-28 NOTE — Assessment & Plan Note (Addendum)
 11/29 -- erythema and swelling improved with gentle compression and adding zyvox .  Some +differential warmth but improved.  ABI's normal  Venous U/S negative for DVT --Appreciate wound care RN's recs --Continue IV cefazolin  --Continue PO Zyvox  until culture --Follow wound culture obtained 11/27 - growing few staph aureus with susceptibilities pending -- Continue local wound care --Elevate leg when at rest --Start compression with ace wraps --Will have Unna boots placed prior to d/c --[facility] RN for wound care & unna boot changes after discharge  Appreciate infectious disease input 11/27:  -- Recommend continue cefazolin  while admitted --Discharge on cefadroxil  and follow-up in ID clinic --Obtain swab for culture of pustule fluid from wound if able --Feels unlikely MRSA given overall clinical improvement on current therapy

## 2024-05-28 NOTE — Assessment & Plan Note (Addendum)
 11/29 -- Continue home terazosin , Proscar  and Flomax 

## 2024-05-28 NOTE — Assessment & Plan Note (Addendum)
 11/29: BP stable --Continue amlodipine  5 mg

## 2024-05-29 ENCOUNTER — Inpatient Hospital Stay (HOSPITAL_COMMUNITY)

## 2024-05-29 LAB — CBC
HCT: 33 % — ABNORMAL LOW (ref 39.0–52.0)
Hemoglobin: 10.1 g/dL — ABNORMAL LOW (ref 13.0–17.0)
MCH: 23.3 pg — ABNORMAL LOW (ref 26.0–34.0)
MCHC: 30.6 g/dL (ref 30.0–36.0)
MCV: 76 fL — ABNORMAL LOW (ref 80.0–100.0)
Platelets: 84 K/uL — ABNORMAL LOW (ref 150–400)
RBC: 4.34 MIL/uL (ref 4.22–5.81)
RDW: 17.2 % — ABNORMAL HIGH (ref 11.5–15.5)
WBC: 6.7 K/uL (ref 4.0–10.5)
nRBC: 0 % (ref 0.0–0.2)

## 2024-05-29 LAB — BASIC METABOLIC PANEL WITH GFR
Anion gap: 12 (ref 5–15)
BUN: 15 mg/dL (ref 8–23)
CO2: 20 mmol/L — ABNORMAL LOW (ref 22–32)
Calcium: 8.3 mg/dL — ABNORMAL LOW (ref 8.9–10.3)
Chloride: 104 mmol/L (ref 98–111)
Creatinine, Ser: 1.21 mg/dL (ref 0.61–1.24)
GFR, Estimated: 59 mL/min — ABNORMAL LOW (ref >60)
Glucose, Bld: 114 mg/dL — ABNORMAL HIGH (ref 70–99)
Potassium: 3.9 mmol/L (ref 3.5–5.1)
Sodium: 136 mmol/L (ref 135–145)

## 2024-05-29 NOTE — Plan of Care (Signed)
  Problem: Education: Goal: Knowledge of General Education information will improve Description: Including pain rating scale, medication(s)/side effects and non-pharmacologic comfort measures 05/29/2024 0319 by Silvestre Edsel BIRCH, RN Outcome: Progressing 05/29/2024 0319 by Silvestre Edsel BIRCH, RN Outcome: Progressing   Problem: Clinical Measurements: Goal: Ability to maintain clinical measurements within normal limits will improve 05/29/2024 0319 by Silvestre Edsel BIRCH, RN Outcome: Progressing 05/29/2024 0319 by Silvestre Edsel BIRCH, RN Outcome: Progressing   Problem: Clinical Measurements: Goal: Will remain free from infection 05/29/2024 0319 by Silvestre Edsel BIRCH, RN Outcome: Progressing 05/29/2024 0319 by Silvestre Edsel BIRCH, RN Outcome: Progressing   Problem: Nutrition: Goal: Adequate nutrition will be maintained 05/29/2024 0319 by Silvestre Edsel BIRCH, RN Outcome: Progressing   Problem: Elimination: Goal: Will not experience complications related to bowel motility 05/29/2024 0319 by Silvestre Edsel BIRCH, RN Outcome: Progressing 05/29/2024 0319 by Silvestre Edsel BIRCH, RN Outcome: Progressing   Problem: Elimination: Goal: Will not experience complications related to urinary retention 05/29/2024 0319 by Silvestre Edsel BIRCH, RN Outcome: Progressing   Problem: Pain Managment: Goal: General experience of comfort will improve and/or be controlled 05/29/2024 0319 by Silvestre Edsel BIRCH, RN Outcome: Progressing 05/29/2024 0319 by Silvestre Edsel BIRCH, RN Outcome: Progressing   Problem: Skin Integrity: Goal: Risk for impaired skin integrity will decrease 05/29/2024 0319 by Silvestre Edsel BIRCH, RN Outcome: Progressing

## 2024-05-29 NOTE — Plan of Care (Signed)
  Problem: Education: Goal: Knowledge of General Education information will improve Description: Including pain rating scale, medication(s)/side effects and non-pharmacologic comfort measures Outcome: Progressing   Problem: Health Behavior/Discharge Planning: Goal: Ability to manage health-related needs will improve Outcome: Progressing   Problem: Clinical Measurements: Goal: Ability to maintain clinical measurements within normal limits will improve Outcome: Progressing Goal: Diagnostic test results will improve Outcome: Progressing Goal: Cardiovascular complication will be avoided Outcome: Progressing   Problem: Activity: Goal: Risk for activity intolerance will decrease Outcome: Progressing   Problem: Nutrition: Goal: Adequate nutrition will be maintained Outcome: Progressing   Problem: Pain Managment: Goal: General experience of comfort will improve and/or be controlled Outcome: Progressing   Problem: Clinical Measurements: Goal: Ability to avoid or minimize complications of infection will improve Outcome: Progressing   Problem: Skin Integrity: Goal: Skin integrity will improve Outcome: Progressing

## 2024-05-29 NOTE — Progress Notes (Addendum)
 Hospital at Home Daily Progress Note   Patient: Justin [name] [facility].  MRN: [id number]  DOB: [birth year]  DOA: 05/26/2024  DOS: the patient was seen and examined on @TODAY @    Patient identified themself as Justin [name].  DOB April 24, 1941  Medic/s Cherise Batty present in the home during encounter and performed the assessment and physical exam. Patient was seen today via video conference; my physical location [facility] [place]   CC: left leg pain and swelling  HPI & Brief hospital course:  [name]. is an 83 y.o. male with medical history significant for hypertension, CAD, remote alcoholism, MDS, and recurrent skin and soft tissue infections who presented on 05/26/2024 for evaluation of redness, pain, and swelling involving the lower left leg, present and progressively worsening over 4 days.  He also developed increased chills and general malaise.   ED Course: Upon arrival to the ED, patient afebrile and saturating well on room air with normal HR and stable BP.  Labs are most notable for creatinine 1.38, normal WBC, normal lactic acid, hemoglobin 10.4, and platelets 37,000.  There is no DVT on the left lower extremity venous Doppler.  CTA chest is negative for PE or other acute finding.  There is diffuse subcutaneous edema without fracture or dislocation on plain radiographs of the left tib/fib.   Blood cultures were collected in the ED and the patient was treated with cefepime .  11/24 -- admitted, continued on IV cefazolin   11/25 -- continued on cefazolin .  Leg with some purulent drainage  Transferred to [facility] program.  11/26 -- doing well at home, wound on lower leg draining some.  Overall slowly improving. Continued on cefazolin .  11/27 --persistent erythema and swelling with associated pain.  Erythema appears to be an evolving and darkening related to venous stasis.  Infectious disease reviewed and made recommendations as below.   Assessment and  Plan:  * Left leg cellulitis 11/27 --persistent erythema and edema but gradually improving, evolving discoloration associated with venous stasis, continued drainage from wound on the leg.   --Appreciate wound care RN's recs --Continue IV cefazolin  -- Continue local wound care --Elevate leg when at rest -- Patient agreeable to ABIs at the hospital tomorrow --Nonie boots if ABIs are normal  Appreciate infectious disease input:  -- Recommend continue cefazolin  while admitted --Discharge on cefadroxil  and follow-up in ID clinic --Obtain swab for culture of pustule fluid from wound if able --Feels unlikely MRSA given overall clinical improvement on current therapy  MDS (myelodysplastic syndrome) (HCC) Follows with Dr. Federico, on Retacrit  every 2 weeks.  Has upcoming appt this week Friday.   11/27: WBC 6.7, hemoglobin 10.1, platelets 84 improved --Monitor CBC  Essential hypertension 11/27: BP stable --Continue amlodipine  5 mg   BPH (benign prostatic hyperplasia) -- Continue home terazosin , Proscar  and Flomax   Low back pain --On home pain regimen  RLS (restless legs syndrome) 11/27--stable, no acute issues --Continue Requip , gabapentin , Sinemet  --Follow up with Neurology outpatient as scheudled  Lumbar radiculopathy --On home pain regimen     Patient Active Problem List   Diagnosis Date Noted   MSSA bacteremia 10/22/2023    Priority: High   Acute on chronic pain of left elbow concerning for septic arthritis 10/21/2023    Priority: High   Abscess of epidural space of spine due to bacteria 10/25/2023    Priority: Medium    Hypokalemia 10/21/2023    Priority: Low   MDS (myelodysplastic syndrome) (HCC) 12/25/2021  Priority: Low   GERD (gastroesophageal reflux disease) 11/13/2018    Priority: Low   Coronary artery disease 10/24/2018    Priority: Low   Essential hypertension 11/29/2016    Priority: Low   BPH (benign prostatic hyperplasia) 12/23/2010    Priority: Low    Low back pain 07/27/2009    Priority: Low   RLS (restless legs syndrome) 10/28/2008    Priority: Low   Rib pain on right side 05/26/2024   Cannot walk 05/26/2024   Left leg cellulitis 05/26/2024   Effusion of bursa of elbow, left 04/04/2024   Bursitis of left elbow 02/13/2024   Cellulitis and abscess of left leg 02/08/2024   Cellulitis of left leg 02/07/2024   Intertrigo 12/16/2023   MSSA (methicillin susceptible Staphylococcus aureus) infection 12/11/2023   Swelling of lower leg 11/17/2023   PICC (peripherally inserted central catheter) in place 11/17/2023   Medication management 11/17/2023   Erysipelas of both lower extremities 11/12/2023   Urinary incontinence 11/12/2023   Septic bursitis of elbow 11/06/2023   Epidural abscess 10/25/2023   Spinal stenosis of lumbar region 10/16/2023   Pseudophakia of both eyes 02/15/2022   Abnormal EKG 11/18/2021   Thrombocytopenia 11/18/2021   Lumbar spondylosis 07/22/2021   Lumbar radiculopathy 03/24/2021   S/P total knee arthroplasty, right 11/09/2020   Weight loss 08/30/2020   Complication associated with orthopedic device 06/10/2020   Diastolic dysfunction 10/27/2019   Exudative age-related macular degeneration of left eye with active choroidal neovascularization (HCC) 10/15/2019   Intermediate stage nonexudative age-related macular degeneration of left eye 10/15/2019   Early stage nonexudative age-related macular degeneration of right eye 10/15/2019   Pseudophakia 10/15/2019   Posterior vitreous detachment of left eye 10/15/2019   Claudication 02/22/2018   Anemia 08/28/2017   DOE (dyspnea on exertion) 07/31/2017   Fatigue 07/31/2017   Smoking greater than 30 pack years 02/02/2016   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) 02/28/2013   Ingrowing toenail of left foot 08/29/2012   Allergic rhinitis 10/10/2011   Dupuytren's contracture of hand 07/05/2011   Vitamin B12 deficiency 12/23/2010   TOBACCO USE, QUIT 07/27/2009   Diverticulosis of  colon 03/28/2009   SKIN RASH, ALLERGIC 10/01/2008   Edema 10/01/2008   Neuropathy 04/22/2008   Vitamin D  deficiency 01/21/2008   INSOMNIA, CHRONIC 12/23/2007   Osteoarthritis 12/23/2007   CAROTID BRUIT, LEFT 12/23/2007      Significant Events: Admitted 05/26/2024 11/26 Transferred to Hospital [facility], remains on IV cefazolin     Subjective / Interval 24 hour History:   Pt seen virtually this AM during Medic home visit.  Patient reports ongoing pain in his left leg as well as swelling.  Denies fevers or chills.  Reports eating well and has gained 2 pounds since being home.  He looks forward to spending Thanksgiving with his family today.  We discussed again obtaining ABIs to access his blood circulation in the legs and he is agreeable to do that tomorrow.      Admission Labs: Normal CMP except glucose 121 CBC -- WBC 2.1 / Hbg 10.7 / Plt 19k Lactic acid 1.0  Admission Imaging Studies: X-ray Left Tib/fib  -- diffuse subcutaneous edema, no abscess or subcutaneous gas seen, no fracture or dislocation CTA chest -- no PE or other acute finding. Aortic atherosclerosis   Significant Labs: WBC improved 2.1 >> 10.0 >> 11.6 >> 8.4 >> 6.7 Platelet improved 19 >> 37 >> 31 >> 44 >> 84 K 3.3 on 11/25 >> 3.6 after replacement  Significant Imaging  Studies: CT left tib / fib 11/25 -- circumferential subcutaneous edema of the left calf, worse distally with suspected medial aspect blistering, no drainable fluid collections seen  Antibiotic Therapy: Anti-infectives (From admission, onward)    Start     Dose/Rate Route Frequency Ordered Stop   05/27/24 1400  ceFAZolin  (ANCEF ) IVPB 2g/100 mL premix        2 g 200 mL/hr over 30 Minutes Intravenous Every 8 hours 05/27/24 1228     05/26/24 2300  ceFAZolin  (ANCEF ) IVPB 2g/100 mL premix  Status:  Discontinued        2 g 200 mL/hr over 30 Minutes Intravenous Every 8 hours 05/26/24 1703 05/27/24 1228   05/26/24 1530  ceFEPIme  (MAXIPIME ) 2 g in  sodium chloride  0.9 % 100 mL IVPB        2 g 200 mL/hr over 30 Minutes Intravenous  Once 05/26/24 1529 05/26/24 1707       Procedures: None  Consultants: Wound care RN          Physical Exam:    05/29/2024    9:27 AM 05/29/2024    1:00 AM 05/28/2024   11:57 PM  Vitals with BMI  Systolic 128    Diastolic 89    Pulse 78 65 72      General exam: awake, alert, no acute distress HEENT: moist mucus membranes, hearing grossly normal  Respiratory system: on room air, normal respiratory effort. Cardiovascular system: 3+ LLE edema Gastrointestinal system: soft, NT, ND, no HSM felt, +bowel sounds. Central nervous system: A&O x 3. no gross focal neurologic deficits, normal speech Extremities: left lower extremity photos below taken today--with persistent erythema somewhat darkened,  Skin: Left leg skin changes as below with wound and surrounding warmth and erythema Psychiatry: normal mood, congruent affect, judgement and insight appear normal                 Data Reviewed:  Notable labs today --  Bicarb 20 Glucose 114 Calcium  8.3 Hemoglobin stable 10.1 Platelets improved 44>>84  Micro: Blood cultures negative to date    Family Communication: None present during virtual encounter.   Disposition: Status is: Inpatient Remains inpatient appropriate because: remains on IV antibiotics pending further improvement   Planned Discharge Destination: Home with HH    Time spent: 45 minutes  Author: Burnard DELENA Cunning, DO [facility]  02/21/2024 7:00 AM  For on call review [email or web address].

## 2024-05-29 NOTE — Progress Notes (Addendum)
 Arrived to patients daughters house to take him back home with EMT   he was loaded and transported without incident   arrived back to his house   immediatley got his antibiotic running and gave him his 4:00 medication and he also asked for his pain meds and tylenol  as well   he stated his pain was a 4 at the time   leg was unbandaged and swabbed per Dr Fausto and sent to the lab   bandage was changed and rewrapped   outline was outlined with a marker of his inflammed area on his leg   done a test run with virtual  RN to make sure he was able to use the tablet correctly   patient has been alert and oriented   he is mobile with his walker  he is a little steadier this afternoon than this morning  leg still looks very angry   its warm to the touch and painful  patient is advised if he needs anything once we leave that he can call the virtual nurse any time he needs   he expressed that he knew how to do that   vitals were stable  patient had a slight rise in temperature since this morning

## 2024-05-29 NOTE — Progress Notes (Signed)
 This clinical research associate arrived at residents home to find him in the bathroom getting ready for the day. Paramedic, Barnie is present as well. Patient came out dressed and ready to go. This writer picked up the modem to take with the patient. This clinical research associate retrieved the wheelchair out of the [place] and brought it to his front door. The patient walked out with his Rolator and sat in the wheelchair with no problems. The patient was loaded into the wheelchair van, secured with 2 front hooks, 2 wheelchair locks, 2 rear floor hooks and a lap seatbelt. The patient was transported to his daughters house for Thanksgiving dinner, patient was unloaded from the wheelchair fleeta, pushed to the front door. The patient was able to stand and use his Rolator to get into daughters house with no problems. The patient sat in a recliner, propped his feet up. The modem was plugged in and working. This clinical research associate phoned the virtual nurse, Jon to make sure his warable monitoring was reading? Jon said she could see he was being monitored with no issues. This writer left the patient in the care of his daughter. ------------------------------------------------------------------------- Nat SQUIBB. Franchot, EMT

## 2024-05-29 NOTE — Progress Notes (Signed)
 Phone call completed with patient. Introduced myself as the CHARITY FUNDRAISER for the night and reminded patient that I am available via phone or tablet all night long. Patient denies any needs at this time. He expressed gratitude for being able to spend Thanksgiving with family and eat a really good meal. Reviewed plan for Medic to come to the home later tonight to assist with meds.

## 2024-05-29 NOTE — Progress Notes (Signed)
 Completed virtual rounds with MD,paramedic at patient bedside. POC reviewed and discussed ,patient voices understanding and agreement. Pt reminded to call RN for any needs, RN and MD available at all times. Pt voices understanding. Pt aware of next planned visit and next call from RN.

## 2024-05-29 NOTE — Progress Notes (Signed)
 This writer went to pick up patient from his daughter's house. This clinical research associate arrived at 14:40. This writer walked to the front door and the patient was standing there waiting on me. This writer helped patient to sit down in the wheelchair. This clinical research associate loaded the patient into the wheelchair [place]. This writer secured the patient with 2 front hooks, 2 wheelchair locks, 2 floor hooks and lap/shoulder seatbelt. This clinical research associate transported patient to his house, arriving at 14:59. This clinical research associate helped patient into his residence, he sat in his recliner. Paramedic, Barnie was getting his medications ready. This clinical research associate took patients vitals, they are listed in the vitals tab. This clinical research associate secured the wheelchair into the [place]. The Paramedic, Levan and Barnie obtained a culture from patients wound. This clinical research associate transported culture to [facility], dropped it off at the lab. --------------------------- Nat SQUIBB. Franchot, EMT

## 2024-05-29 NOTE — Progress Notes (Signed)
 Arrived to patient   he was waiting our arrival at the front door   he is mobile with his walker   he is alert and oriented   he states he feels pretty good this morning    IV antibiotics were started and morning meds were given    he stated his leg is quite painful and asked for pain medication   he stated 6 out of 10 on pain scale   vitals are within normal limits   after antibiotics are finished he went to the bathroom to get dressed for his Thanksgiving with his family   Nat has arrived for his transport to his daughters house   he is asked if there is anything else I could do for him and he stated he was fine

## 2024-05-29 NOTE — Progress Notes (Signed)
 Pt performed a test call on ipad and was successful (pt wasn't sure he completely understood how to call nurse on ipad),paramedic was at his side.

## 2024-05-29 NOTE — Plan of Care (Signed)
  Problem: Education: Goal: Knowledge of General Education information will improve Description: Including pain rating scale, medication(s)/side effects and non-pharmacologic comfort measures Outcome: Progressing   Problem: Health Behavior/Discharge Planning: Goal: Ability to manage health-related needs will improve Outcome: Progressing   Problem: Clinical Measurements: Goal: Ability to maintain clinical measurements within normal limits will improve Outcome: Progressing Goal: Will remain free from infection Outcome: Progressing Goal: Diagnostic test results will improve Outcome: Progressing Goal: Respiratory complications will improve Outcome: Progressing Goal: Cardiovascular complication will be avoided Outcome: Progressing   Problem: Clinical Measurements: Goal: Ability to maintain clinical measurements within normal limits will improve Outcome: Progressing Goal: Will remain free from infection Outcome: Progressing Goal: Diagnostic test results will improve Outcome: Progressing Goal: Respiratory complications will improve Outcome: Progressing Goal: Cardiovascular complication will be avoided Outcome: Progressing   Problem: Activity: Goal: Risk for activity intolerance will decrease Outcome: Progressing   Problem: Nutrition: Goal: Adequate nutrition will be maintained Outcome: Progressing   Problem: Coping: Goal: Level of anxiety will decrease Outcome: Progressing   Problem: Elimination: Goal: Will not experience complications related to bowel motility Outcome: Progressing Goal: Will not experience complications related to urinary retention Outcome: Progressing   Problem: Safety: Goal: Ability to remain free from injury will improve Outcome: Progressing   Problem: Pain Managment: Goal: General experience of comfort will improve and/or be controlled Outcome: Progressing   Problem: Skin Integrity: Goal: Skin integrity will improve Outcome: Progressing

## 2024-05-30 ENCOUNTER — Telehealth: Payer: Self-pay | Admitting: Hematology and Oncology

## 2024-05-30 ENCOUNTER — Inpatient Hospital Stay (HOSPITAL_COMMUNITY)

## 2024-05-30 ENCOUNTER — Inpatient Hospital Stay

## 2024-05-30 DIAGNOSIS — M7989 Other specified soft tissue disorders: Secondary | ICD-10-CM | POA: Diagnosis not present

## 2024-05-30 DIAGNOSIS — R52 Pain, unspecified: Secondary | ICD-10-CM | POA: Diagnosis not present

## 2024-05-30 DIAGNOSIS — L0390 Cellulitis, unspecified: Secondary | ICD-10-CM

## 2024-05-30 DIAGNOSIS — L538 Other specified erythematous conditions: Secondary | ICD-10-CM | POA: Diagnosis not present

## 2024-05-30 LAB — BASIC METABOLIC PANEL WITH GFR
Anion gap: 8 (ref 5–15)
BUN: 16 mg/dL (ref 8–23)
CO2: 23 mmol/L (ref 22–32)
Calcium: 8.3 mg/dL — ABNORMAL LOW (ref 8.9–10.3)
Chloride: 106 mmol/L (ref 98–111)
Creatinine, Ser: <0.3 mg/dL — ABNORMAL LOW (ref 0.61–1.24)
Glucose, Bld: 116 mg/dL — ABNORMAL HIGH (ref 70–99)
Potassium: 4.1 mmol/L (ref 3.5–5.1)
Sodium: 137 mmol/L (ref 135–145)

## 2024-05-30 LAB — CBC
HCT: 29.8 % — ABNORMAL LOW (ref 39.0–52.0)
Hemoglobin: 9 g/dL — ABNORMAL LOW (ref 13.0–17.0)
MCH: 23.3 pg — ABNORMAL LOW (ref 26.0–34.0)
MCHC: 30.2 g/dL (ref 30.0–36.0)
MCV: 77.2 fL — ABNORMAL LOW (ref 80.0–100.0)
Platelets: 102 K/uL — ABNORMAL LOW (ref 150–400)
RBC: 3.86 MIL/uL — ABNORMAL LOW (ref 4.22–5.81)
RDW: 17.2 % — ABNORMAL HIGH (ref 11.5–15.5)
WBC: 5.2 K/uL (ref 4.0–10.5)
nRBC: 0 % (ref 0.0–0.2)

## 2024-05-30 LAB — VAS US ABI WITH/WO TBI
Left ABI: 1.17
Right ABI: 1.18

## 2024-05-30 MED ORDER — LINEZOLID 600 MG PO TABS
600.0000 mg | ORAL_TABLET | Freq: Two times a day (BID) | ORAL | Status: DC
  Administered 2024-05-30 – 2024-06-01 (×5): 600 mg via ORAL
  Filled 2024-05-30 (×10): qty 1

## 2024-05-30 MED ORDER — OXYCODONE HCL 5 MG PO TABS: 10.0000 mg | ORAL_TABLET | ORAL | 0 refills | Status: DC | PRN

## 2024-05-30 NOTE — Progress Notes (Signed)
 Phone call completed with patient. Informed him that I would be his nurse for the night and would be available via phone call or tablet. Patient states that he is doing well but feeling a little tired tonight. He was currently up in the kitchen doing task but was looking forward to getting off his leg. Plan for the evening discussed with patient which includes a visit from the medic later tonight to give IV antibiotic. Patient encouraged to call if any needs arise before then.

## 2024-05-30 NOTE — Progress Notes (Signed)
 VASCULAR LAB    Left lower extremity venous duplex has been performed.  See CV proc for preliminary results.   Dio Giller, RVT 05/30/2024, 1:39 PM

## 2024-05-30 NOTE — Progress Notes (Signed)
 Spoke with pt via phone and updated on time of paramedic visit.

## 2024-05-30 NOTE — Progress Notes (Signed)
 Physical Therapy Treatment Patient Details Name: Justin [name] [facility]. MRN: [id number] DOB: [birth year] Today's Date: 05/30/2024   History of Present Illness 83 yo male presents to therapy following hospital admission on 05/26/2024 due to L LE cellulitis worsening over the past 4 days. Pt negative for B LE DVT and PE. Pt started on ABX. Pt PMH includes but is not limited to: HTN, CAD, MDS, diverticulitis, GERD, glaucoma, TIA, LBP, DDD, BPH, macular degeneration, substance abuse, L elbow fx and subsequent surgeries, and R TKA.    PT Comments  Brief follow-up arranged by RN via video chat this afternoon. Pt states he has been able to mobilize independently but does not tolerate for long due to the pain in his LLE. Reviewed recommendations including use of RW over rollator for benefit of adequate unloading for comfort and increased mobility to prevent secondary complications associated with immobility. Pt verbalized agreement. He declines HEP or HHPT follow-up as he feels he is approaching his baseline ability aside from the limitations resulting from the LLE pain. Encouraged to reach out to our team at any time and we can continue to assist in functional recovery. Update recs to OPPT once d/c from H@H  since he has declined HHPT. He may progress beyond this as well if he is truly approaching his baseline but I suspect he could benefit from some post-acute follow-up.   If plan is discharge home, recommend the following: A little help with walking and/or transfers;A little help with bathing/dressing/bathroom;Assistance with cooking/housework;Assist for transportation;Help with stairs or ramp for entrance   Can travel by private vehicle        Equipment Recommendations  None recommended by PT    Recommendations for Other Services       Precautions / Restrictions Precautions Precautions: Fall Restrictions Weight Bearing Restrictions Per Provider Order: No     Mobility  Bed Mobility                     Transfers                        Ambulation/Gait                   Stairs             Wheelchair Mobility     Tilt Bed    Modified Rankin (Stroke Patients Only)       Balance                                            Communication Communication Communication: No apparent difficulties  Cognition Arousal: Alert Behavior During Therapy: WFL for tasks assessed/performed   PT - Cognitive impairments: No apparent impairments                         Following commands: Intact      Cueing    Exercises      General Comments General comments (skin integrity, edema, etc.): Follow-up with pt to review information discussed during evaluation and home safety since enrollment with H@H  progam. Pt states he has not been moving much at all due to leg pain. Instructed pt on device selection (ideally RW) for proper unloading and comfort to enable pt safely increase activity at home. Notes indicate pt is mobilizing without physical assist, able to  answer door. He reports ability to complete ADLs such as toileting and dressing. Pt declines HEP or HH follow-up upon d/c. Feels he is close to baseline aside from limitations due to LE pain.      Pertinent Vitals/Pain Pain Assessment Pain Assessment: Faces Faces Pain Scale: Hurts little more Pain Location: L LE Pain Descriptors / Indicators: Burning Pain Intervention(s): Other (comment) (Educated on proper positioning)    Home Living                          Prior Function            PT Goals (current goals can now be found in the care plan section) Acute Rehab PT Goals Patient Stated Goal: to be able to go home PT Goal Formulation: With patient Time For Goal Achievement: 06/10/24 Potential to Achieve Goals: Good Progress towards PT goals: Progressing toward goals    Frequency    Min 3X/week      PT Plan      Co-evaluation               AM-PAC PT 6 Clicks Mobility   Outcome Measure  Help needed turning from your back to your side while in a flat bed without using bedrails?: None Help needed moving from lying on your back to sitting on the side of a flat bed without using bedrails?: None Help needed moving to and from a bed to a chair (including a wheelchair)?: None Help needed standing up from a chair using your arms (e.g., wheelchair or bedside chair)?: None Help needed to walk in hospital room?: None Help needed climbing 3-5 steps with a railing? : A Lot 6 Click Score: 22    End of Session     Patient left: Other (comment) (Pt on his personal recliner) Nurse Communication: Other (comment) (H@H  RN available via video during visit.) PT Visit Diagnosis: Unsteadiness on feet (R26.81);Other abnormalities of gait and mobility (R26.89);Difficulty in walking, not elsewhere classified (R26.2);Pain Pain - Right/Left: Left Pain - part of body: Leg     Time: 8451-8443 PT Time Calculation (min) (ACUTE ONLY): 8 min  Charges:    $Self Care/Home Management: 8-22 PT General Charges $$ ACUTE PT VISIT: 1 Visit                     Justin [name], PT, DPT [facility]  Rehabilitation Services Physical Therapist Office: [phone] Website: [email or web address]    Justin [name] 05/30/2024, 4:56 PM

## 2024-05-30 NOTE — Progress Notes (Signed)
 Hospital at [facility] Daily Progress Note   Patient: Antionne Enrique [facility].  MRN: [id number]  DOB: 10/19/1940  DOA: 05/26/2024  DOS: the patient was seen and examined on @TODAY @    Patient identified themself as [name].  DOB 07/09/1940  Patient was seen & examined in person today when present at [facility] for ultrasound.   CC: left leg pain and swelling  HPI & Brief hospital course:  Ibn Stief. is an 83 y.o. male with medical history significant for hypertension, CAD, remote alcoholism, MDS, and recurrent skin and soft tissue infections who presented on 05/26/2024 for evaluation of redness, pain, and swelling involving the lower left leg, present and progressively worsening over 4 days.  He also developed increased chills and general malaise.   ED Course: Upon arrival to the ED, patient afebrile and saturating well on room air with normal HR and stable BP.  Labs are most notable for creatinine 1.38, normal WBC, normal lactic acid, hemoglobin 10.4, and platelets 37,000.  There is no DVT on the left lower extremity venous Doppler.  CTA chest is negative for PE or other acute finding.  There is diffuse subcutaneous edema without fracture or dislocation on plain radiographs of the left tib/fib.   Blood cultures were collected in the ED and the patient was treated with cefepime .  11/24 -- admitted, continued on IV cefazolin   11/25 -- continued on cefazolin .  Leg with some purulent drainage  Transferred to [facility] program.  11/26 -- doing well at home, wound on lower leg draining some.  Overall slowly improving. Continued on cefazolin .  11/27 --persistent erythema and swelling with associated pain.  Erythema appears to be an evolving and darkening related to venous stasis.  Infectious disease reviewed and made recommendations as below.  11/28 -- erythema persistent but differential warmth resolved, swelling improving.  Wound cx few staph aureus. Added  PO Zynox given evening visit Medic observed some extension beyond ink margin.     Assessment and Plan:  * Left leg cellulitis 11/28 -- erythema persistent but differential warmth has resolved, swelling improving, wound oozing  ABI's normal Venous U/S negative for DVT --Appreciate wound care RN's recs --Continue IV cefazolin  --Add PO Zyvox  until culture --Follow wound culture obtained 11/27 - growing few staph aureus with susceptibilities pending -- Continue local wound care --Elevate leg when at rest --Start compression with ace wraps --Will have Unna boots placed prior to d/c --[facility] RN for wound care & unna boot changes after discharge  Appreciate infectious disease input 11/27:  -- Recommend continue cefazolin  while admitted --Discharge on cefadroxil  and follow-up in ID clinic --Obtain swab for culture of pustule fluid from wound if able --Feels unlikely MRSA given overall clinical improvement on current therapy  MDS (myelodysplastic syndrome) (HCC) Follows with Dr. Federico, on Retacrit  every 2 weeks.  Has upcoming appt this week Friday.   11/28: WBC 5.2, hemoglobin 9.0, platelets 102 improved --Monitor CBC  Essential hypertension 11/28: BP stable --Continue amlodipine  5 mg   BPH (benign prostatic hyperplasia) 11/28  -- Continue home terazosin , Proscar  and Flomax   Low back pain 11/28 - stable --On home pain regimen  RLS (restless legs syndrome) 11/28--stable, no acute issues --Continue Requip , gabapentin , Sinemet  --Follow up with Neurology outpatient as scheudled  Lumbar radiculopathy --On home pain regimen     Patient Active Problem List   Diagnosis Date Noted   MSSA bacteremia 10/22/2023    Priority: High   Acute  on chronic pain of left elbow concerning for septic arthritis 10/21/2023    Priority: High   Abscess of epidural space of spine due to bacteria 10/25/2023    Priority: Medium    Hypokalemia 10/21/2023    Priority: Low   MDS (myelodysplastic  syndrome) (HCC) 12/25/2021    Priority: Low   GERD (gastroesophageal reflux disease) 11/13/2018    Priority: Low   Coronary artery disease 10/24/2018    Priority: Low   Essential hypertension 11/29/2016    Priority: Low   BPH (benign prostatic hyperplasia) 12/23/2010    Priority: Low   Low back pain 07/27/2009    Priority: Low   RLS (restless legs syndrome) 10/28/2008    Priority: Low   Rib pain on right side 05/26/2024   Cannot walk 05/26/2024   Left leg cellulitis 05/26/2024   Effusion of bursa of elbow, left 04/04/2024   Bursitis of left elbow 02/13/2024   Cellulitis and abscess of left leg 02/08/2024   Cellulitis of left leg 02/07/2024   Intertrigo 12/16/2023   MSSA (methicillin susceptible Staphylococcus aureus) infection 12/11/2023   Swelling of lower leg 11/17/2023   PICC (peripherally inserted central catheter) in place 11/17/2023   Medication management 11/17/2023   Erysipelas of both lower extremities 11/12/2023   Urinary incontinence 11/12/2023   Septic bursitis of elbow 11/06/2023   Epidural abscess 10/25/2023   Spinal stenosis of lumbar region 10/16/2023   Pseudophakia of both eyes 02/15/2022   Abnormal EKG 11/18/2021   Thrombocytopenia 11/18/2021   Lumbar spondylosis 07/22/2021   Lumbar radiculopathy 03/24/2021   S/P total knee arthroplasty, right 11/09/2020   Weight loss 08/30/2020   Complication associated with orthopedic device 06/10/2020   Diastolic dysfunction 10/27/2019   Exudative age-related macular degeneration of left eye with active choroidal neovascularization (HCC) 10/15/2019   Intermediate stage nonexudative age-related macular degeneration of left eye 10/15/2019   Early stage nonexudative age-related macular degeneration of right eye 10/15/2019   Pseudophakia 10/15/2019   Posterior vitreous detachment of left eye 10/15/2019   Claudication 02/22/2018   Anemia 08/28/2017   DOE (dyspnea on exertion) 07/31/2017   Fatigue 07/31/2017   Smoking  greater than 30 pack years 02/02/2016   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) 02/28/2013   Ingrowing toenail of left foot 08/29/2012   Allergic rhinitis 10/10/2011   Dupuytren's contracture of hand 07/05/2011   Vitamin B12 deficiency 12/23/2010   TOBACCO USE, QUIT 07/27/2009   Diverticulosis of colon 03/28/2009   SKIN RASH, ALLERGIC 10/01/2008   Edema 10/01/2008   Neuropathy 04/22/2008   Vitamin D  deficiency 01/21/2008   INSOMNIA, CHRONIC 12/23/2007   Osteoarthritis 12/23/2007   CAROTID BRUIT, LEFT 12/23/2007      Significant Events: Admitted 05/26/2024 11/26 Transferred to [facility], remains on IV cefazolin     Subjective / Interval 24 hour History:   Pt seen in person while at the hospital for ultrasound today.  He reports overall feeling improved since admission.  He was very run down and weak and says this is better. Leg pain is fairly controlled with his usual pain medications for his back.  No fever/chills but states he is cold-natured.  Reports good appetite.      Admission Labs: Normal CMP except glucose 121 CBC -- WBC 2.1 / Hbg 10.7 / Plt 19k Lactic acid 1.0  Admission Imaging Studies: X-ray Left Tib/fib  -- diffuse subcutaneous edema, no abscess or subcutaneous gas seen, no fracture or dislocation CTA chest -- no PE or other acute finding. Aortic  atherosclerosis   Significant Labs: WBC improved 2.1 >> 10.0 >> 11.6 >> 8.4 >> 6.7 >> 5.2 Platelet improved 19 >> 37 >> 31 >> 44 >> 84 >> 102 K 3.3 on 11/25 >> 3.6 after replacement Cr trend stable 1.15 >> 1.27 >> 1.21  Significant Imaging Studies: 11/25: CT left tib / fib 11/25 -- circumferential subcutaneous edema of the left calf, worse distally with suspected medial aspect blistering, no drainable fluid collections seen 11:28: ABI's normal 11/28: Venous doppler U/S negative for DVT  Antibiotic Therapy: Anti-infectives (From admission, onward)    Start     Dose/Rate Route Frequency Ordered Stop   05/30/24  1000  linezolid  (ZYVOX ) tablet 600 mg        600 mg Oral Every 12 hours 05/30/24 0716     05/27/24 1400  ceFAZolin  (ANCEF ) IVPB 2g/100 mL premix        2 g 200 mL/hr over 30 Minutes Intravenous Every 8 hours 05/27/24 1228     05/26/24 2300  ceFAZolin  (ANCEF ) IVPB 2g/100 mL premix  Status:  Discontinued        2 g 200 mL/hr over 30 Minutes Intravenous Every 8 hours 05/26/24 1703 05/27/24 1228   05/26/24 1530  ceFEPIme  (MAXIPIME ) 2 g in sodium chloride  0.9 % 100 mL IVPB        2 g 200 mL/hr over 30 Minutes Intravenous  Once 05/26/24 1529 05/26/24 1707       Procedures: None  Consultants: Wound care RN      Physical Exam:    05/30/2024    1:42 PM 05/30/2024    9:50 AM 05/29/2024   10:00 PM  Vitals with BMI  Systolic 141 120 856  Diastolic 59 65 75  Pulse 44 61 82      General exam: awake, alert, no acute distress HEENT: moist mucus membranes, hearing grossly normal, clear conjunctiva, anicteric sclera Respiratory system: on room air, normal respiratory effort.  CTAB no wheezes or rhonchi Cardiovascular system: normal S1/S2, LLE 2+ edema improving Central nervous system: A&O x 3. no gross focal neurologic deficits, normal speech Extremities: moves all, LLE erythema but without differential warmth, wound pictured below  Skin: as above & photos below, no differential warmth from right--left leg Psychiatry: normal mood, congruent affect, judgement and insight appear normal                 Data Reviewed: Notable labs as above  Micro: Blood cultures negative to date  Wound culture growing few staph aureus - pending sensitivities    Family Communication: Daughter updated by phone this afternoon   Disposition: Status is: Inpatient Remains inpatient appropriate because: remains on IV antibiotics pending further improvement   Planned Discharge Destination: Home with [facility]    Time spent: 45 minutes  Author: Burnard DELENA Cunning, DO [facility]  02/21/2024 7:00 AM  For on call review [email or web address].

## 2024-05-30 NOTE — Progress Notes (Signed)
 H@H  medics Sarah and Jones Viviani out this evening for a visit. On arrival the crew was welcomed in by the patient who was sitting with his family. On assessment of the wound on the (L) lower leg, no changes were noticed from this morning. At that time the posterior redness was noticeably darker and had moved outside of the area drawn a few days ago. The leg was wrapped with an ace bandage as ordered by provider. IV antibiotics and oral medications were given at this time. No other tasks were needed. H@H  visit complete.

## 2024-05-30 NOTE — Progress Notes (Signed)
 I spoke with RN at Dr. Federico 's office [phone]) in regards to injection patient receives every 2 weeks for his myelodysplastic syndrome. The nurse states it is a very controlled protocol and patient cannot receive this injection at home. I spoke with patients daughter Landry [phone]) and informed her of the need to reschedule appointment.

## 2024-05-30 NOTE — Consult Note (Signed)
 WOC Nurse Consult Note: Reason for Consult: requested an ABI exam for Unna boot treatment. Wound type: venous insufficiency Pressure Injury POA: NA Partial thickness previous assessed by [facility] team 11/26. Dressing procedure/placement/frequency: Cover with piece of silver hydrofiber Soila # [phone]) and top with foam.  Wrap legs with Nonie boot by ortho tech or home health nurse twice a week.  Addendum: The home health nurse can apply the Unna boot if the ortho tech is unable to do related the [facility].  Normal ABI results - Right leg 1.18; left leg 1.17.  WOC team will not plan to follow further. Please reconsult if further assistance is needed. Thank-you,  Lela Holm MSN, RN, CNS.  (Phone [phone])

## 2024-05-30 NOTE — Progress Notes (Signed)
 This clinical research associate arrived at patients house at 11:41 to pick him up and transport him to [facility] for test. The patient was ready and at the door. This clinical research associate pushed patient in wheelchair to the wheelchair fleeta, loaded him in. This writer secured patient via 2 front floor hooks, 2 wheelchair locks, 2 rear floor hooks and lap/shoulder seatbelt. The patient was transported to [facility], arriving at 12:08. This clinical research associate pushed patient to [facility] for test. The patient was taken back by staff at 12:37 and patient was brought out at 12:10. This writer pushed patient to the Infusion Clinic where Dr. Fausto could evaluate patients leg wound and speak with him. This clinical research associate obtained vital signs and entered them into EPIC under vital sign tab. Patient was pushed out to wheelchair van, secured via 2 front floor hooks, 2 wheelchair locks, 2 rear floor hooks and lap/shoulder seatbelt. Patient was transported home, arriving at 2:13. Patient was pushed to his front door, he walked into his house and sat down in his recliner. This diplomatic services operational officer, Jon to see if she needed me to assist with anything, she stated no. This clinical research associate left the patients house. ------------------------------------------------------------------------ Nat SQUIBB. Franchot, EMT

## 2024-05-30 NOTE — Progress Notes (Signed)
 VASCULAR LAB    ABI has been performed.  See CV proc for preliminary results.   Erin Obando, RVT 05/30/2024, 1:39 PM

## 2024-05-30 NOTE — Plan of Care (Signed)
  Problem: Education: Goal: Knowledge of General Education information will improve Description: Including pain rating scale, medication(s)/side effects and non-pharmacologic comfort measures Outcome: Progressing   Problem: Health Behavior/Discharge Planning: Goal: Ability to manage health-related needs will improve Outcome: Progressing   Problem: Clinical Measurements: Goal: Ability to maintain clinical measurements within normal limits will improve Outcome: Progressing Goal: Diagnostic test results will improve Outcome: Progressing   Problem: Activity: Goal: Risk for activity intolerance will decrease Outcome: Progressing   Problem: Nutrition: Goal: Adequate nutrition will be maintained Outcome: Progressing   Problem: Pain Managment: Goal: General experience of comfort will improve and/or be controlled Outcome: Progressing   Problem: Safety: Goal: Ability to remain free from injury will improve Outcome: Progressing   Problem: Skin Integrity: Goal: Risk for impaired skin integrity will decrease Outcome: Progressing   Problem: Clinical Measurements: Goal: Ability to avoid or minimize complications of infection will improve Outcome: Progressing   Problem: Skin Integrity: Goal: Skin integrity will improve Outcome: Progressing

## 2024-05-30 NOTE — Progress Notes (Addendum)
 The pt was seen today for his evening visit by [facility] staff. Upon arrival the pt answered the door. He was alert and oriented x4. His skin was warm dry and normal in color. He had a strong radial pulse and was breathing normally. The pt stated that he was very thankful and grateful to have sent the holiday with his family. Vials were taken and are as noted.  The virtual nurse was called and medications were gone over with her. It was noted that he did not have anymore of his Ropinirole  Due the pt's medication were found and one of his pills was used for tonight. Pictures of the bottle were taken and unloaded onto EPIC.The pt's medications were sealed and placed back in his closet.  One the pt's IV anti-biotics were done he asked if his IV could be removed and a new placed due to it being uncomfortable. The old IV was removed and the pt now has a 20g in the right forearm.  Upon examining the pt's leg it was noted to be warm to the touch above the line that draw yesterday. The pt was noted to be hot to the touch,red and swollen.   The pt was asked if anything else could be do for him and he denied same. The pt was told to call if anything were to change.

## 2024-05-30 NOTE — Progress Notes (Signed)
 Spoke with patient's daughter via phone and updated on results of test (patient gave verbal consent this am).

## 2024-05-30 NOTE — Plan of Care (Signed)

## 2024-05-30 NOTE — Progress Notes (Signed)
 Pt called in via ipad. Pt reporting pain 6/10, self adm oxycodone  and tylenol  with supervision of virtual RN.   Reviewed plan for the day,updated need for prn med refills. Pt aware RN will call back with time for ABI. Discussed the process of transport regarding those tests,pt son with him at this time.

## 2024-05-30 NOTE — Progress Notes (Signed)
 H@H  medics Sarah and Briannon Boggio arrived this morning for visit. On arrival crew was let in by patients daughter who was arriving at the same time. On assessment, lung sounds clear and equal bilaterally, heart sounds S1, S2, abd soft non-tender x4 quadrants, A&Ox4, +4 pedal edema present with (L) lower leg redness and warmth present. (L) lateral lower leg wound was covered with an ace bandage and tefla. Pt states that he's been feeling good since he got his oxycodone  this morning. Pt had no other complaints this morning. Antibiotics were given as well as morning doses of medication. Wound was redressed with Ag microfiber and gauze. No other tasks were needed for this visit. H@H  visit complete.

## 2024-05-31 LAB — CBC
HCT: 29.2 % — ABNORMAL LOW (ref 39.0–52.0)
Hemoglobin: 8.9 g/dL — ABNORMAL LOW (ref 13.0–17.0)
MCH: 23.6 pg — ABNORMAL LOW (ref 26.0–34.0)
MCHC: 30.5 g/dL (ref 30.0–36.0)
MCV: 77.5 fL — ABNORMAL LOW (ref 80.0–100.0)
Platelets: 117 K/uL — ABNORMAL LOW (ref 150–400)
RBC: 3.77 MIL/uL — ABNORMAL LOW (ref 4.22–5.81)
RDW: 17.2 % — ABNORMAL HIGH (ref 11.5–15.5)
WBC: 4.1 K/uL (ref 4.0–10.5)
nRBC: 0 % (ref 0.0–0.2)

## 2024-05-31 LAB — BASIC METABOLIC PANEL WITH GFR
Anion gap: 11 (ref 5–15)
BUN: 16 mg/dL (ref 8–23)
CO2: 21 mmol/L — ABNORMAL LOW (ref 22–32)
Calcium: 8.4 mg/dL — ABNORMAL LOW (ref 8.9–10.3)
Chloride: 108 mmol/L (ref 98–111)
Creatinine, Ser: 1.09 mg/dL (ref 0.61–1.24)
GFR, Estimated: >60 mL/min (ref >60)
Glucose, Bld: 94 mg/dL (ref 70–99)
Potassium: 4.2 mmol/L (ref 3.5–5.1)
Sodium: 140 mmol/L (ref 135–145)

## 2024-05-31 LAB — CULTURE, BLOOD (ROUTINE X 2)
Culture: NO GROWTH
Culture: NO GROWTH
Special Requests: ADEQUATE
Special Requests: ADEQUATE

## 2024-05-31 MED ORDER — FUROSEMIDE 20 MG PO TABS
20.0000 mg | ORAL_TABLET | Freq: Every day | ORAL | Status: DC
  Administered 2024-06-01: 20 mg via ORAL
  Filled 2024-05-31 (×3): qty 1

## 2024-05-31 NOTE — Plan of Care (Signed)
  Problem: Clinical Measurements: Goal: Will remain free from infection Outcome: Progressing   Problem: Clinical Measurements: Goal: Diagnostic test results will improve Outcome: Progressing   Problem: Clinical Measurements: Goal: Cardiovascular complication will be avoided Outcome: Progressing   Problem: Nutrition: Goal: Adequate nutrition will be maintained Outcome: Progressing   Problem: Elimination: Goal: Will not experience complications related to bowel motility Outcome: Progressing   Problem: Elimination: Goal: Will not experience complications related to urinary retention Outcome: Progressing   Problem: Pain Managment: Goal: General experience of comfort will improve and/or be controlled Outcome: Progressing   Problem: Safety: Goal: Ability to remain free from injury will improve Outcome: Progressing   Problem: Clinical Measurements: Goal: Ability to avoid or minimize complications of infection will improve Outcome: Progressing

## 2024-05-31 NOTE — Progress Notes (Signed)
 Overnight patient has had several current health alarms for bradycardia, with heart rate down into the 30's. Patient does not sustain the low heart rate but this has occurred intermittently throughout the shift. The NP on call was notified and made aware. All other vitals have remained stable.

## 2024-05-31 NOTE — Progress Notes (Signed)
 Arrived to find patient sitting in a recliner in no obvious distress. Pt alert and oriented x4, stated pain was decreased by pain meds taken prior to arrival.  IV clear dry and intact, flushed with no effort. IV antibiotics infused and vitals as noted.   Vitals as noted, and heart rate watched on [facility] pulse ox as well as tested on Medic heart rate to confirm accuracy.    Virtual visit occurred with RN.

## 2024-05-31 NOTE — Progress Notes (Signed)
 Hospital [facility] Daily Progress Note   Patient: Justin [name].  MRN: [id number]  DOB: 1941-02-11  DOA: 05/26/2024  DOS: the patient was seen and examined on 05/31/24    Patient identified themself as Justin [name].  DOB 1940-09-14  Medic Richerd Batty present in the home during encounter and performed the physical exam and assessment Patient was seen today via video chat; my physical location [place], [place]    CC: left leg pain and swelling  HPI & Brief hospital course:  [name]. is an 83 y.o. male with medical history significant for hypertension, CAD, remote alcoholism, MDS, and recurrent skin and soft tissue infections who presented on 05/26/2024 for evaluation of redness, pain, and swelling involving the lower left leg, present and progressively worsening over 4 days.  He also developed increased chills and general malaise.   ED Course: Upon arrival to the ED, patient afebrile and saturating well on room air with normal HR and stable BP.  Labs are most notable for creatinine 1.38, normal WBC, normal lactic acid, hemoglobin 10.4, and platelets 37,000.  There is no DVT on the left lower extremity venous Doppler.  CTA chest is negative for PE or other acute finding.  There is diffuse subcutaneous edema without fracture or dislocation on plain radiographs of the left tib/fib.   Blood cultures were collected in the ED and the patient was treated with cefepime .  11/24 -- admitted, continued on IV cefazolin   11/25 -- continued on cefazolin .  Leg with some purulent drainage  [facility]red to [facility] program.  11/26 -- doing well at home, wound on lower leg draining some.  Overall slowly improving. Continued on cefazolin .  11/27 --persistent erythema and swelling with associated pain.  Erythema appears to be an evolving and darkening related to venous stasis.  Infectious disease reviewed and made recommendations as below.  11/28 -- erythema  persistent but differential warmth resolved, swelling improving.  Wound cx few staph aureus. Added PO Zynox given evening visit Medic observed some extension beyond ink margin.    11/29 -- improvement in erythema and swelling today. Wound culture pending.   Assessment and Plan:  * Left leg cellulitis 11/29 -- erythema and swelling improved with gentle compression and adding zyvox .  Some +differential warmth but improved.  ABI's normal  Venous U/S negative for DVT --Appreciate wound care RN's recs --Continue IV cefazolin  --Continue PO Zyvox  until culture --Follow wound culture obtained 11/27 - growing few staph aureus with susceptibilities pending -- Continue local wound care --Elevate leg when at rest --Start compression with ace wraps --Will have Unna boots placed prior to d/c --[facility] RN for wound care & unna boot changes after discharge  Appreciate infectious disease input 11/27:  -- Recommend continue cefazolin  while admitted --Discharge on cefadroxil  and follow-up in ID clinic --Obtain swab for culture of pustule fluid from wound if able --Feels unlikely MRSA given overall clinical improvement on current therapy  MDS (myelodysplastic syndrome) (HCC) Follows with Dr. Federico, on Retacrit  every 2 weeks.  Has upcoming appt this week Friday.   11/29: WBC 4.1, hemoglobin 8.9, platelets 117 improved --Monitor CBC  Essential hypertension 11/29: BP stable --Continue amlodipine  5 mg   BPH (benign prostatic hyperplasia) 11/29 -- Continue home terazosin , Proscar  and Flomax   Low back pain 11/29 - stable --On home pain regimen  RLS (restless legs syndrome) 11/29--stable, no acute issues --Continue Requip , gabapentin , Sinemet  --Follow up with Neurology outpatient as scheudled  Lumbar radiculopathy --  On home pain regimen     Patient Active Problem List   Diagnosis Date Noted   MSSA bacteremia 10/22/2023    Priority: High   Acute on chronic pain of left elbow concerning for  septic arthritis 10/21/2023    Priority: High   Abscess of epidural space of spine due to bacteria 10/25/2023    Priority: Medium    Hypokalemia 10/21/2023    Priority: Low   MDS (myelodysplastic syndrome) (HCC) 12/25/2021    Priority: Low   GERD (gastroesophageal reflux disease) 11/13/2018    Priority: Low   Coronary artery disease 10/24/2018    Priority: Low   Essential hypertension 11/29/2016    Priority: Low   BPH (benign prostatic hyperplasia) 12/23/2010    Priority: Low   Low back pain 07/27/2009    Priority: Low   RLS (restless legs syndrome) 10/28/2008    Priority: Low   Rib pain on right side 05/26/2024   Cannot walk 05/26/2024   Left leg cellulitis 05/26/2024   Effusion of bursa of elbow, left 04/04/2024   Bursitis of left elbow 02/13/2024   Cellulitis and abscess of left leg 02/08/2024   Cellulitis of left leg 02/07/2024   Intertrigo 12/16/2023   MSSA (methicillin susceptible Staphylococcus aureus) infection 12/11/2023   Swelling of lower leg 11/17/2023   PICC (peripherally inserted central catheter) in place 11/17/2023   Medication management 11/17/2023   Erysipelas of both lower extremities 11/12/2023   Urinary incontinence 11/12/2023   Septic bursitis of elbow 11/06/2023   Epidural abscess 10/25/2023   Spinal stenosis of lumbar region 10/16/2023   Pseudophakia of both eyes 02/15/2022   Abnormal EKG 11/18/2021   Thrombocytopenia 11/18/2021   Lumbar spondylosis 07/22/2021   Lumbar radiculopathy 03/24/2021   S/P total knee arthroplasty, right 11/09/2020   Weight loss 08/30/2020   Complication associated with orthopedic device 06/10/2020   Diastolic dysfunction 10/27/2019   Exudative age-related macular degeneration of left eye with active choroidal neovascularization (HCC) 10/15/2019   Intermediate stage nonexudative age-related macular degeneration of left eye 10/15/2019   Early stage nonexudative age-related macular degeneration of right eye 10/15/2019    Pseudophakia 10/15/2019   Posterior vitreous detachment of left eye 10/15/2019   Claudication 02/22/2018   Anemia 08/28/2017   DOE (dyspnea on exertion) 07/31/2017   Fatigue 07/31/2017   Smoking greater than 30 pack years 02/02/2016   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) 02/28/2013   Ingrowing toenail of left foot 08/29/2012   Allergic rhinitis 10/10/2011   Dupuytren's contracture of hand 07/05/2011   Vitamin B12 deficiency 12/23/2010   TOBACCO USE, QUIT 07/27/2009   Diverticulosis of colon 03/28/2009   SKIN RASH, ALLERGIC 10/01/2008   Edema 10/01/2008   Neuropathy 04/22/2008   Vitamin D  deficiency 01/21/2008   INSOMNIA, CHRONIC 12/23/2007   Osteoarthritis 12/23/2007   CAROTID BRUIT, LEFT 12/23/2007      Significant Events: Admitted 05/26/2024 11/26 Transferred to [facility], remains on IV cefazolin     Subjective / Interval 24 hour History:   Pt seen in person while at the hospital for ultrasound today.  He reports overall feeling improved since admission.  He was very run down and weak and says this is better. Leg pain is fairly controlled with his usual pain medications for his back.  No fever/chills but states he is cold-natured.  Reports good appetite.      Admission Labs: Normal CMP except glucose 121 CBC -- WBC 2.1 / Hbg 10.7 / Plt 19k Lactic acid 1.0  Admission  Imaging Studies: X-ray Left Tib/fib  -- diffuse subcutaneous edema, no abscess or subcutaneous gas seen, no fracture or dislocation CTA chest -- no PE or other acute finding. Aortic atherosclerosis   Significant Labs: WBC improved 2.1 >> 10.0 >> 11.6 >> 8.4 >> 6.7 >> 5.2 >> 4.1 Platelet improved 19 >> 37 >> 31 >> 44 >> 84 >> 102 >> 117 Hbg trend 9.5 >> 10.1 >> 9.0 >> 8.9 stable K 3.3 on 11/25 >> 3.6 after replacement Cr trend stable 1.15 >> 1.27 >> 1.21 >> 1.09  Significant Imaging Studies: 11/25: CT left tib / fib 11/25 -- circumferential subcutaneous edema of the left calf, worse distally with  suspected medial aspect blistering, no drainable fluid collections seen 11:28: ABI's normal 11/28: Venous doppler U/S negative for DVT  Antibiotic Therapy: Anti-infectives (From admission, onward)    Start     Dose/Rate Route Frequency Ordered Stop   05/30/24 1000  linezolid  (ZYVOX ) tablet 600 mg        600 mg Oral Every 12 hours 05/30/24 0716     05/27/24 1400  ceFAZolin  (ANCEF ) IVPB 2g/100 mL premix        2 g 200 mL/hr over 30 Minutes Intravenous Every 8 hours 05/27/24 1228     05/26/24 2300  ceFAZolin  (ANCEF ) IVPB 2g/100 mL premix  Status:  Discontinued        2 g 200 mL/hr over 30 Minutes Intravenous Every 8 hours 05/26/24 1703 05/27/24 1228   05/26/24 1530  ceFEPIme  (MAXIPIME ) 2 g in sodium chloride  0.9 % 100 mL IVPB        2 g 200 mL/hr over 30 Minutes Intravenous  Once 05/26/24 1529 05/26/24 1707       Procedures: None  Consultants: Wound care RN      Physical Exam:    05/31/2024    8:40 AM 05/30/2024   10:51 PM 05/30/2024    4:58 PM  Vitals with BMI  Systolic 143 133 854  Diastolic 78 73 74  Pulse 51 75       General exam: awake, alert, no acute distress HEENT: PERRLA moist mucus membranes, hearing grossly normal, clear conjunctiva, anicteric sclera Respiratory system: on room air, normal respiratory effort.   Cardiovascular system: normal S1/S2, LLE 2+ edema improving Gastrointestinal: soft-non-tender Central nervous system: A&O x 3. no gross focal neurologic deficits, normal speech Extremities: RLE no edema, LLE edema to below knee proximal to ace wrap, LLE erythema with wound as in photos below Skin: photos of LLE below, no new erythema beyond ink margins was noted  Psychiatry: normal mood, congruent affect, judgement and insight appear normal            Data Reviewed: Notable labs as above  Micro: Blood cultures negative to date  Wound culture growing few staph aureus - pending sensitivities    Family Communication: Daughter  updated by phone this afternoon   Disposition: Status is: Inpatient Remains inpatient appropriate because: remains on IV antibiotics pending further improvement   Planned Discharge Destination: Home with [facility]    Time spent: 45 minutes  Author: Burnard DELENA Cunning, DO [facility]  02/21/2024 7:00 AM  For on call review [email or web address].

## 2024-05-31 NOTE — Progress Notes (Signed)
 Patient reports 4/10 back pain. PRN oxycodone  and tylenol  given. Denies headache dizziness, and SOB. Plan of care reviewed with patient. Plan for paramedic visit between [phone] today.

## 2024-05-31 NOTE — Progress Notes (Signed)
 Patient's pulse intermittently in the 30s-40s throughout the day via [employer]. No complaints from the patient. No acute distress noted. Dr. Fausto made aware EKG ordered.

## 2024-05-31 NOTE — Progress Notes (Addendum)
 2112-- Introductory contact call via phone. Patient identifiers reviewed. Patient requesting medication administration assistance. Current Health alert for help addressed. Patient agreed to video call.     2115-Patient contact via Video call.  Current health data alert for help addressed.  Patient sitting up in recliner, son at side. Patient A&O x4. Patient reported pain at level 4. Self-administration medication assistance provided for three medications with [employer]. Patient request assistance to take other scheduled medications once paramedic arrives for night visit. Assessment completed. Per Patient no other questions or concerns at the time of video call reported.  Patient informed Virtual RN will continue to be monitored overnight. encouraged to call as needed. [facility] contact information reviewed.      2237- Patient Notified of Paramedic ETA for night visit. Current Health alert for Bradycardia Pulse Rate (39, 40, and 47) addressed, patient reported he feels fine and not under any distress. Patient report this is a known issue and confirmed he is asymptomatic. Night APP also notified.

## 2024-05-31 NOTE — Progress Notes (Signed)
 Arrived to find the PT sitting upright in his recliner, no obvious distress noted. PT is Aox4 and speaks clearly. PT stated that he slept fine last night. He had called the virtual RN to take pain medication earlier this morning. His original pain was a 10/10 and is now stating that it is a 4/10 on the pain scale. PT stated that his leg was not hurting and it was mostly his back.   Physical exam showed negative DCAPBTLS to the head, neck, or face. PERRLA. Negative JVD or tracheal deviation. PT denied any SOB or difficulty breathing. Chest expansion is equal, PT denied any CP. ABD is soft, non-tender. Right leg is noted to be atraumatic, no pitting edema noted, pedal pulse is present. Left lower leg had notable edema below the knee and above the ACE bandage wrap. PT's leg was unwrapped and wound was exposed. Noted erythema and tenderness upon palpation. Skin was warm to the touch. No new erythema noted past the previously drawn markings on his leg. PMS was intact.   All medications were administered as prescribed. IV was flushed and patent after abx. A new curos cap was placed. Wound care was performed and pictures of the wound were uploaded to the chart. PT had virtual visit with DO Fausto and all questions were answered. Possible d/c date of 06/01/24.   PT's daughter arrived to the home and was informed of the meeting and findings. She did not have any other questions. PT denied having any questions and was reminded to call the RN if he needed anything.

## 2024-05-31 NOTE — Plan of Care (Signed)

## 2024-05-31 NOTE — TOC Progression Note (Signed)
 Transition of Care (TOC) - Progression Note    Patient Details  Name: Justin [name] [name]. MRN: [id number] Date of Birth: 1940/07/20  Transition of Care [name]) CM/SW Contact  Corean [name], RN Phone Number: 05/31/2024, 5:33 PM  Clinical Narrative:     Discussed in IDR. Patient has been set up with home health RN and PT via [facility] Provider aware of need for wound care orders for DC  Potential DC tomorrow from H @H  program Patient has been having some bradycardia asmptomatic, is followed as OP by cardiology. Cardiology on AVS for patient to reach out and make appt.   Expected Discharge Plan: Home w Home Health Services Barriers to Discharge: Continued Medical Work up               Expected Discharge Plan and Services   Discharge Planning Services: CM Consult   Living arrangements for the past 2 months: Single Family Home                                       Social Drivers of Health (SDOH) Interventions SDOH Screenings   Food Insecurity: No Food Insecurity (05/27/2024)  Housing: Unknown (05/27/2024)  Transportation Needs: No Transportation Needs (05/27/2024)  Utilities: At Risk (05/27/2024)  Alcohol  Screen: Low Risk  (07/20/2023)  Depression (PHQ2-9): Low Risk  (05/15/2024)  Financial Resource Strain: Low Risk  (07/20/2023)  Physical Activity: Inactive (07/20/2023)  Social Connections: Moderately Isolated (05/27/2024)  Stress: No Stress Concern Present (07/20/2023)  Tobacco Use: Medium Risk (05/26/2024)  Health Literacy: Adequate Health Literacy (07/20/2023)    Readmission Risk Interventions    02/08/2024    4:22 PM  Readmission Risk Prevention Plan  Post Dischage Appt Complete  Medication Screening Complete  Transportation Screening Complete

## 2024-05-31 NOTE — Progress Notes (Signed)
 Pt was seen today for his second at home visit by Hospital at home staff. Upon arrival the pt was seated in his livingroom with his legs elevated in his recliner. The pt was alert and oriented x4. His skin was warm,dry and normal in color with the exception of his Lower left leg. The pt was noted to have improvement to the swelling and redness but same was still present.  He stated that he had a good day today and was taken for his appointment this morning and that it went well.   The pt's IV antibiotics were started as noted.Vitals were taken as noted. The virtual nurse was called and medications were gone over with her. Pt's medications were administered.   He was informed of the projected discharge and he stated that he felt comfortable with it being on Sunday.   The pt was asked if he needed any assistance and he denied same. He stated he was going to bed. The pt was informed to call if he needed anything and he stated that he would.  At this time the pt's visit was completed.

## 2024-06-01 LAB — BODY FLUID CULTURE W GRAM STAIN

## 2024-06-01 MED ORDER — POLYVINYL ALCOHOL 1.4 % OP SOLN: 1.0000 [drp] | OPHTHALMIC | Status: AC | PRN

## 2024-06-01 MED ORDER — LINEZOLID 600 MG PO TABS
600.0000 mg | ORAL_TABLET | Freq: Once | ORAL | Status: DC
  Filled 2024-06-01: qty 1

## 2024-06-01 MED ORDER — OXYCODONE HCL 10 MG PO TABS: 10.0000 mg | ORAL_TABLET | ORAL | Status: AC | PRN

## 2024-06-01 MED ORDER — CEFADROXIL 500 MG PO CAPS: 500.0000 mg | ORAL_CAPSULE | Freq: Two times a day (BID) | ORAL | 0 refills | Status: AC

## 2024-06-01 MED ORDER — LINEZOLID 600 MG PO TABS: 600.0000 mg | ORAL_TABLET | Freq: Two times a day (BID) | ORAL | 0 refills | Status: AC

## 2024-06-01 MED ORDER — FUROSEMIDE 20 MG PO TABS: 20.0000 mg | ORAL_TABLET | Freq: Every day | ORAL | 0 refills | Status: AC

## 2024-06-01 NOTE — Progress Notes (Signed)
 The pt was seen today for his evenig visit.  The pt was alert and orinted x4. His skin was warm dry and normal in color expect for his left leg. Same does appear less red and hot to the touch but the swelling is still the same and the redness did expand over the line that had been previosuly drawn.   The pt stated he had a good day and has been msotyl resting.  Vitals were taken and are as noted. A 12-lead EKG was obtinaed and the pt was noted to have a HR of 74. His EKG did show a sinus pause but not heart blocks were noted or bradycardia was noted. The pt states he has been told soemthign about a pause in the past.   His IV antibiotics were given and the rest of his nightime medications.  Vitals were taken and are as noted.   Once his antibiotics were done the pt was infomed of hsi poteintal discharge tomorrow and he stated that he was very thankful for this team and how well he has been treated. The pt's IMM letter was singed.  The pt was asked if there was anything else that could be done for him before leaving and he denied same.

## 2024-06-01 NOTE — TOC Transition Note (Signed)
 Transition of Care [name]) - Discharge Note   Patient Details  Name: Justin [name]. MRN: [id number] Date of Birth: 1940/08/18  [facility]) CM/SW Contact:  Corean [name], RN Phone Number: 06/01/2024, 2:09 PM   Clinical Narrative:     Patient to be discharged today [name] made aware- discharge orders in for wound care No further needs identified  Final next level of care: Home w Home Health Services Barriers to Discharge: No Barriers Identified   Patient Goals and CMS Choice     Choice offered to / list presented to : Adult Children, Patient      Discharge Placement                       Discharge Plan and Services Additional resources added to the After Visit Summary for     Discharge Planning Services: CM Consult                                 Social Drivers of Health (SDOH) Interventions SDOH Screenings   Food Insecurity: No Food Insecurity (05/27/2024)  Housing: Unknown (05/27/2024)  Transportation Needs: No Transportation Needs (05/27/2024)  Utilities: At Risk (05/27/2024)  Alcohol  Screen: Low Risk  (07/20/2023)  Depression (PHQ2-9): Low Risk  (05/15/2024)  Financial Resource Strain: Low Risk  (07/20/2023)  Physical Activity: Inactive (07/20/2023)  Social Connections: Moderately Isolated (05/27/2024)  Stress: No Stress Concern Present (07/20/2023)  Tobacco Use: Medium Risk (05/26/2024)  Health Literacy: Adequate Health Literacy (07/20/2023)     Readmission Risk Interventions    02/08/2024    4:22 PM  Readmission Risk Prevention Plan  Post Dischage Appt Complete  Medication Screening Complete  Transportation Screening Complete

## 2024-06-01 NOTE — Discharge Summary (Signed)
 [name] -- Physician Discharge Summary   Patient: Justin Justin [name]. MRN: [id number] DOB: 04-15-1941  Admit date:     05/26/2024  Discharge date: 06/14/2024  Discharge Physician: Burnard DELENA Cunning   PCP: Garald Karlynn GAILS, MD    Patient identified themself as Justin Justin [name].  DOB 06/25/1941  Medics Lauraine Faes and Consuelo Kerns present in the home during encounter and performed the physical exam. Patient was seen today via video chat; my physical location [name]    Recommendations at discharge:   Follow up with PCP in 1 week Follow up with Infectious Disease in 1-2 weeks Follow pending wound culture - growing staph aureus with susceptibilities pending at time of d/c Follow up with Cardiology regarding slow heart rates Repeat CBC, BMP in 1 week  Continue wound care to right lower extremity wound as below  Cleanse LLE wound with saline, pat dry Cover with piece of silver hydrofiber and top with foam.    Unna's boots they can be applied directly over this dressing and changed only when Unna's boot changes (typically 2x week)   05/30/24 1452       Discharge Diagnoses: Principal Problem:   Left leg cellulitis Active Problems:   RLS (restless legs syndrome)   Low back pain   BPH (benign prostatic hyperplasia)   Essential hypertension   MDS (myelodysplastic syndrome) (HCC)   Lumbar radiculopathy  Resolved Problems:   * No resolved Justin [name] problems. *  Justin [name] Course: Justin Justin [name]. is an 83 y.o. male with medical history significant for hypertension, CAD, remote alcoholism, MDS, and recurrent skin and soft tissue infections who presented on 05/26/2024 for evaluation of redness, pain, and swelling involving the lower left leg, present and progressively worsening over 4 days.  He also developed increased chills and general malaise.   ED Course: Upon arrival to the ED, patient afebrile and saturating well on room air with normal HR and  stable BP.  Labs are most notable for creatinine 1.38, normal WBC, normal lactic acid, hemoglobin 10.4, and platelets 37,000.  There is no DVT on the left lower extremity venous Doppler.  CTA chest is negative for PE or other acute finding.  There is diffuse subcutaneous edema without fracture or dislocation on plain radiographs of the left tib/fib.   Blood cultures were collected in the ED and the patient was treated with cefepime .  11/24 -- admitted, continued on IV cefazolin   11/25 -- continued on cefazolin .  Leg with some purulent drainage  Transferred to [name] program.  11/26 -- doing well at home, wound on lower leg draining some.  Overall slowly improving. Continued on cefazolin .  11/27 --persistent erythema and swelling with associated pain.  Erythema appears to be an evolving and darkening related to venous stasis.  Infectious disease reviewed and made recommendations as below.  11/28 -- erythema persistent but differential warmth resolved, swelling improving.  Wound cx few staph aureus. Added PO Zynox given evening visit Medic observed some extension beyond ink margin.    11/29 -- improvement in erythema and swelling today. Wound culture pending.  11/30 -- pt reports feeling well overall, left leg stable with improvement on current antibiotics. Wound culture still pending susceptibilities, MRSA not yet ruled out.  Pt agreeable with continuing both antibiotics.  He will have ID follow up in clinic to ensure adequate treatment and resolution.  Patient is medically stable and agreeable for d/c today.  Discharge antibiotics PO Duricef and  Zyvox  x 7 more days.    Assessment and Plan: * Left leg cellulitis 11/29 -- erythema and swelling improved with gentle compression and adding zyvox .  Some +differential warmth but improved.  ABI's normal  Venous U/S negative for DVT --Appreciate wound care RN's recs --Continue IV cefazolin  --Continue PO Zyvox  until culture --Follow  wound culture obtained 11/27 - growing few staph aureus with susceptibilities pending -- Continue local wound care --Elevate leg when at rest --Start compression with ace wraps --Will have Unna boots placed prior to d/c --[facility] RN for wound care & unna boot changes after discharge  Appreciate infectious disease input 11/27:  -- Recommend continue cefazolin  while admitted --Discharge on cefadroxil  and follow-up in ID clinic --Obtain swab for culture of pustule fluid from wound if able --Feels unlikely MRSA given overall clinical improvement on current therapy  MDS (myelodysplastic syndrome) (HCC) Follows with Dr. Federico, on Retacrit  every 2 weeks.  Has upcoming appt this week Friday.   11/29: WBC 4.1, hemoglobin 8.9, platelets 117 improved --Monitor CBC  Essential hypertension 11/29: BP stable --Continue amlodipine  5 mg   BPH (benign prostatic hyperplasia) 11/29 -- Continue home terazosin , Proscar  and Flomax   Low back pain 11/29 - stable --On home pain regimen  RLS (restless legs syndrome) 11/29--stable, no acute issues --Continue Requip , gabapentin , Sinemet  --Follow up with Neurology outpatient as scheudled  Lumbar radiculopathy --On home pain regimen         Consultants: None Procedures performed: None  Disposition: Home health Diet recommendation:  Regular diet  DISCHARGE MEDICATION: Allergies as of 06/01/2024       Reactions   Tape Other (See Comments)   SKIN IS VERY THIN AND WILL TEAR AND BRUISE EASILY!!! Paper tape is ok.   Levofloxacin Other (See Comments)   Hands peeled        Medication List     STOP taking these medications    doxycycline  100 MG tablet Commonly known as: VIBRA -TABS       TAKE these medications    carbidopa -levodopa  25-100 MG tablet Commonly known as: SINEMET  IR For breakthrough restless leg, you can take 1 tablet at bedtime.  OK to take extra tab as needed during the day. What changed:  how much to take how to  take this when to take this additional instructions The timing of this medication is very important.   rOPINIRole  2 MG tablet Commonly known as: REQUIP  TAKE 1 TABLET BY MOUTH AT AT NOON AND 1 TAB AT 4PM AND 1 TAB AT BEDTIME What changed:  how much to take how to take this when to take this reasons to take this additional instructions The timing of this medication is very important.   acetaminophen  500 MG tablet Commonly known as: TYLENOL  Take 1,000 mg by mouth every 6 (six) hours as needed for mild pain (pain score 1-3).   amLODipine  5 MG tablet Commonly known as: NORVASC  TAKE 1 TABLET BY MOUTH DAILY What changed: when to take this   artificial tears ophthalmic solution Place 1 drop into both eyes as needed for dry eyes (At night).   calcium  carbonate 750 MG chewable tablet Commonly known as: TUMS EX Chew 1 tablet by mouth 2 (two) times daily as needed for heartburn.   finasteride  5 MG tablet Commonly known as: PROSCAR  TAKE 1 TABLET (5 MG TOTAL) BY MOUTH DAILY.   furosemide  20 MG tablet Commonly known as: LASIX  Take 1 tablet (20 mg total) by mouth daily. What changed:  when to take  this reasons to take this additional instructions   gabapentin  300 MG capsule Commonly known as: NEURONTIN  Take 1 capsule (300 mg total) by mouth 3 (three) times daily.   ibuprofen 200 MG tablet Commonly known as: ADVIL Take 400 mg by mouth every 8 (eight) hours as needed for mild pain (pain score 1-3) (if not taking Tylenol ).   magnesium  hydroxide 400 MG/5ML suspension Commonly known as: MILK OF MAGNESIA Take 15 mLs by mouth daily as needed for mild constipation.   mupirocin  ointment 2 % Commonly known as: BACTROBAN  Apply 1 Application topically 2 (two) times daily. What changed:  when to take this additional instructions   ondansetron  4 MG tablet Commonly known as: ZOFRAN  Take 1 tablet (4 mg total) by mouth every 6 (six) hours as needed for nausea or vomiting.    Oxycodone  HCl 10 MG Tabs Take 1 tablet (10 mg total) by mouth every 4 (four) hours as needed (pain). What changed:  when to take this reasons to take this   pravastatin  20 MG tablet Commonly known as: PRAVACHOL  TAKE 1 TABLET (20 MG TOTAL) BY MOUTH DAILY.   PRESERVISION AREDS PO Take 1 capsule by mouth 2 (two) times daily.   SYSTANE OP Place 1 drop into both eyes 2 (two) times daily as needed (for dryness).   tamsulosin  0.4 MG Caps capsule Commonly known as: FLOMAX  TAKE 1 CAPSULE (0.4 MG TOTAL) BY MOUTH IN THE MORNING.   terazosin  2 MG capsule Commonly known as: HYTRIN  TAKE 1 CAPSULE BY MOUTH EVERY NIGHT AT BEDTIME   Vitamin B-12 1000 MCG Subl DISSOLVE 2 TABLETS IN MOUTH EVERY DAY What changed:  how much to take how to take this when to take this additional instructions   Vitamin D3 1000 units Caps Take 1,000 Units by mouth in the morning and at bedtime.       ASK your doctor about these medications    cefadroxil  500 MG capsule Commonly known as: DURICEF Take 1 capsule (500 mg total) by mouth 2 (two) times daily for 7 days. Ask about: Should I take this medication?   linezolid  600 MG tablet Commonly known as: ZYVOX  Take 1 tablet (600 mg total) by mouth every 12 (twelve) hours for 7 days. Ask about: Should I take this medication?               Discharge Care Instructions  (From admission, onward)           Start     Ordered   06/01/24 0000  Discharge wound care:       Comments: Cleanse LLE wound with saline, pat dry Cover wound with piece of silver hydrofiber and top with foam.     Unna's boots they can be applied directly over this dressing and changed only during Unna's boot changes (typically 2x week)   06/01/24 1256            Contact information for follow-up providers     Plotnikov, Karlynn GAILS, MD Follow up.   Specialty: Internal Medicine Contact information: [address] [place] [address] [phone]          Rana Lum CROME, NP Follow up.   Specialty: Cardiology Why: Please reach out to office to schedule a follow up appt for cardiology Contact information: [address] [place] [address] [phone]         Dea Shiner, MD. Schedule an appointment as soon as possible for a visit.   Specialty: Infectious Diseases Why: Follow  up within 2 weeks for left lower extremity cellulitis Contact information: [address] [employer] [address] [place] [address] [phone]              Contact information for after-discharge care     Home Medical Care     [facility]) .   Service: Home Health Services Contact information: [phone]   [phone]                    Discharge Exam: Filed Weights   05/26/24 1151 05/28/24 1630  Weight: 68 kg 69.4 kg   General exam: awake, alert, no acute distress HEENT: voice clear, moist mucus membranes, hearing grossly normal  Respiratory system: CTAB, no wheezes, rales or rhonchi, normal respiratory effort. Cardiovascular system: normal S1/S2, RRR Gastrointestinal system: soft, NT, ND, +bowel sounds. Central nervous system: A&O x 3. no gross focal neurologic deficits, normal speech Extremities: photos below - LLE erythema Skin: LLE photos below Psychiatry: normal mood, congruent affect, judgement and insight appear normal        Condition at discharge: stable  The results of significant diagnostics from this hospitalization (including imaging, microbiology, ancillary and laboratory) are listed below for reference.   Imaging Studies: VAS US  LOWER EXTREMITY VENOUS (DVT) Result Date: 05/30/2024  Lower Venous DVT Study Patient Name:  Justin Spiller [place] [name].  Date of Exam:   05/30/2024 Medical Rec #: [id number]               Accession #:    [id number] Date of Birth: [birth year]               Patient Gender: M Patient Age:   42 years Exam Location:   [facility] Procedure:      VAS US  LOWER EXTREMITY VENOUS (DVT) Referring Phys: Justin Justin [name] --------------------------------------------------------------------------------  Indications: Pain, Swelling, Erythema, and Recurrent cellulitis.  Risk Factors: Myelodysplastic syndrome. Comparison Study: Prior negative left LEV done 05/26/24 Performing Technologist: Alberta Lis RVS  Examination Guidelines: A complete evaluation includes B-mode imaging, spectral Doppler, color Doppler, and power Doppler as needed of all accessible portions of each vessel. Bilateral testing is considered an integral part of a complete examination. Limited examinations for reoccurring indications may be performed as noted. The reflux portion of the exam is performed with the patient in reverse Trendelenburg.  +-----+---------------+---------+-----------+----------+--------------+ RIGHTCompressibilityPhasicitySpontaneityPropertiesThrombus Aging +-----+---------------+---------+-----------+----------+--------------+ CFV  Full           Yes      Yes                                 +-----+---------------+---------+-----------+----------+--------------+ SFJ  Full                                                        +-----+---------------+---------+-----------+----------+--------------+   +---------+---------------+---------+-----------+----------+--------------+ LEFT     CompressibilityPhasicitySpontaneityPropertiesThrombus Aging +---------+---------------+---------+-----------+----------+--------------+ CFV      Full           Yes      Yes                                 +---------+---------------+---------+-----------+----------+--------------+ SFJ  Full                                                        +---------+---------------+---------+-----------+----------+--------------+ FV Prox  Full           Yes      Yes                                  +---------+---------------+---------+-----------+----------+--------------+ FV Mid   Full           Yes      Yes                                 +---------+---------------+---------+-----------+----------+--------------+ FV DistalFull           Yes      Yes                                 +---------+---------------+---------+-----------+----------+--------------+ PFV      Full           Yes      No                                  +---------+---------------+---------+-----------+----------+--------------+ POP      Full           Yes      Yes                                 +---------+---------------+---------+-----------+----------+--------------+ PTV      Full                                                        +---------+---------------+---------+-----------+----------+--------------+ PERO     Full                                                        +---------+---------------+---------+-----------+----------+--------------+ Gastroc  Full                                                        +---------+---------------+---------+-----------+----------+--------------+ SSV      Full                               thickened                +---------+---------------+---------+-----------+----------+--------------+     Summary: RIGHT: - No evidence of common femoral vein obstruction.  Interstitial edema noted  LEFT: - There is no evidence of deep vein  thrombosis in the lower extremity.  - No cystic structure found in the popliteal fossa. - Subcutaneous edema noted throughout the calf - Ultrasound characteristics of enlarged lymph nodes noted in the groin. Interstitial edema noted.  *See table(s) above for measurements and observations. Electronically signed by Debby Robertson on 05/30/2024 at 4:23:40 PM.    Final    VAS US  ABI WITH/WO TBI Result Date: 05/30/2024  LOWER EXTREMITY DOPPLER STUDY Patient Name:  Justin DINAPOLI Schwall JR.  Date of Exam:   05/30/2024  Medical Rec #: [id number]               Accession #:    [id number] Date of Birth: [birth year]               Patient Gender: M Patient Age:   70 years Exam [name] Procedure:      VAS US  ABI WITH/WO TBI Referring Phys: Kare Dado --------------------------------------------------------------------------------  Indications: Recurrent cellulitis High Risk Factors: Hypertension, past history of smoking, coronary artery                    disease. Other Factors: Myelodysplastic syndrome.  Limitations: Today's exam was limited due to Edema of left foot. Comparison Study: Prior ABI done 02/28/2018 Performing Technologist: Rachel Pellet RVS  Examination Guidelines: A complete evaluation includes at minimum, Doppler waveform signals and systolic blood pressure reading at the level of bilateral brachial, anterior tibial, and posterior tibial arteries, when vessel segments are accessible. Bilateral testing is considered an integral part of a complete examination. Photoelectric Plethysmograph (PPG) waveforms and toe systolic pressure readings are included as required and additional duplex testing as needed. Limited examinations for reoccurring indications may be performed as noted.  ABI Findings: +---------+------------------+-----+-----------+--------+ Right    Rt Pressure (mmHg)IndexWaveform   Comment  +---------+------------------+-----+-----------+--------+ Brachial 150                    triphasic           +---------+------------------+-----+-----------+--------+ PTA      177               1.18 multiphasic         +---------+------------------+-----+-----------+--------+ DP       164               1.09 triphasic           +---------+------------------+-----+-----------+--------+ Great Toe155               1.03 Normal              +---------+------------------+-----+-----------+--------+ +---------+------------------+-----+---------+-------+ Left     Lt Pressure  (mmHg)IndexWaveform Comment +---------+------------------+-----+---------+-------+ Brachial 144                    triphasic        +---------+------------------+-----+---------+-------+ PTA      175               1.17 triphasic        +---------+------------------+-----+---------+-------+ DP       166               1.11 triphasic        +---------+------------------+-----+---------+-------+ Justin Justin [name]               0.89 Normal           +---------+------------------+-----+---------+-------+ +-------+-----------+-----------+------------+------------+ ABI/TBIToday's ABIToday's TBIPrevious ABIPrevious TBI +-------+-----------+-----------+------------+------------+ Right  1.18       1.03  1.14        0.77         +-------+-----------+-----------+------------+------------+ Left   1.17       0.89       1.17        0.88         +-------+-----------+-----------+------------+------------+ Bilateral ABIs and TBIs appear essentially unchanged compared to prior study on 02/28/2018.  Summary: Right: Resting right ankle-brachial index is within normal range. The right toe-brachial index is normal.  Left: Resting left ankle-brachial index is within normal range. The left toe-brachial index is normal.  *See table(s) above for measurements and observations.  Electronically signed by Debby Robertson on 05/30/2024 at 4:23:23 PM.    Final    CT TIBIA FIBULA LEFT W CONTRAST Result Date: 05/27/2024 EXAM: CT LEFT LOWER EXTREMITY, WITH IV CONTRAST 05/27/2024 11:24:59 AM TECHNIQUE: Axial images were acquired through the left lower extremity with IV contrast. Reformatted images were reviewed. Automated exposure control, iterative reconstruction, and/or weight based adjustment of the mA/kV was utilized to reduce the radiation dose to as low as reasonably achievable. COMPARISON: Radiographs 05/26/2024. CLINICAL HISTORY: Soft tissue infection suspected, lower leg. FINDINGS: BONES AND  JOINTS: No acute fracture or focal osseous lesion. No bony destructive findings characteristic of osteomyelitis. No dislocation. The joint spaces are normal. SOFT TISSUES: Circumferential subcutaneous edema in the calf especially distally and extending to overlying the medial and lateral malleoli. Edema tracks along the superficial fascial margin of the medial head left gastrocnemius but no drainable fluid collection is identified. Suspected cutaneous blistering medially along the distal calf about 8.5 cm proximal to the tibiotalar joint. Mild cutaneous irregularity just above this level medially as on image 264 series 10. IMPRESSION: 1. Circumferential subcutaneous edema in the left calf, especially distally, with suspected medial distal calf cutaneous blistering; no drainable fluid collection identified. Electronically signed by: Ryan Salvage MD 05/27/2024 11:39 AM EST RP Workstation: [id number]   VAS US  LOWER EXTREMITY VENOUS (DVT) (ONLY [facility] & [facility]) Result Date: 05/26/2024  Lower Venous DVT Study Patient Name:  Justin Fischman [facility].  Date of Exam:   05/26/2024 Medical Rec #: [id number]               Accession #:    [id number] Date of Birth: 04-08-1941               Patient Gender: M Patient Age:   47 years Exam Location:  [facility] Procedure:      VAS US  LOWER EXTREMITY VENOUS (DVT) Referring Phys: ALEXIS YOUNG --------------------------------------------------------------------------------  Indications: Pain, Swelling, and Erythema. Other Indications: Cellulitis. Risk Factors: Myelodysplastic syndrome. Limitations: Poor ultrasound/tissue interface. Comparison Study: Previous exam on 11/15/2023 was negative for DVT Performing Technologist: Alberta Lis RVS  Examination Guidelines: A complete evaluation includes B-mode imaging, spectral Doppler, color Doppler, and power Doppler as needed of all accessible portions of each vessel. Bilateral testing is considered an integral part of a complete  examination. Limited examinations for reoccurring indications may be performed as noted. The reflux portion of the exam is performed with the patient in reverse Trendelenburg.  +-----+---------------+---------+-----------+----------+--------------+ RIGHTCompressibilityPhasicitySpontaneityPropertiesThrombus Aging +-----+---------------+---------+-----------+----------+--------------+ CFV  Full           Yes      Yes                                 +-----+---------------+---------+-----------+----------+--------------+   +---------+---------------+---------+-----------+----------+-------------------+ LEFT     CompressibilityPhasicitySpontaneityPropertiesThrombus Aging      +---------+---------------+---------+-----------+----------+-------------------+  CFV      Full           Yes      Yes                                      +---------+---------------+---------+-----------+----------+-------------------+ SFJ      Full                                                             +---------+---------------+---------+-----------+----------+-------------------+ FV Prox  Full                                                             +---------+---------------+---------+-----------+----------+-------------------+ FV Mid   Full                                                             +---------+---------------+---------+-----------+----------+-------------------+ FV DistalFull                                                             +---------+---------------+---------+-----------+----------+-------------------+ PFV      Full                                                             +---------+---------------+---------+-----------+----------+-------------------+ POP      Full           Yes      Yes                                      +---------+---------------+---------+-----------+----------+-------------------+ PTV      Full                                                              +---------+---------------+---------+-----------+----------+-------------------+ PERO                                                  Not well visualized +---------+---------------+---------+-----------+----------+-------------------+     Summary: RIGHT: - No evidence of common femoral vein obstruction.   LEFT: - There is no evidence of  deep vein thrombosis in the lower extremity.  - No cystic structure found in the popliteal fossa. Subcutaneous in area of calf.  *See table(s) above for measurements and observations. Electronically signed by Lonni Gaskins MD on 05/26/2024 at 5:00:15 PM.    Final    CT Angio Chest PE W and/or Wo Contrast Result Date: 05/26/2024 CLINICAL DATA:  Left leg swelling and pain, myelodysplastic syndrome, right rib pain EXAM: CT ANGIOGRAPHY CHEST WITH CONTRAST TECHNIQUE: Multidetector CT imaging of the chest was performed using the standard protocol during bolus administration of intravenous contrast. Multiplanar CT image reconstructions and MIPs were obtained to evaluate the vascular anatomy. RADIATION DOSE REDUCTION: This exam was performed according to the departmental dose-optimization program which includes automated exposure control, adjustment of the mA and/or kV according to patient size and/or use of iterative reconstruction technique. CONTRAST:  75mL OMNIPAQUE  IOHEXOL  350 MG/ML SOLN COMPARISON:  10/28/2022, 07/23/2018 FINDINGS: Cardiovascular: This is a technically adequate evaluation of the pulmonary vasculature. No filling defects or pulmonary emboli. The heart is unremarkable without pericardial effusion. No evidence of thoracic aortic aneurysm or dissection. Atherosclerosis of the aorta and coronary vasculature. Mediastinum/Nodes: No enlarged mediastinal, hilar, or axillary lymph nodes. Thyroid  gland, trachea, and esophagus demonstrate no significant findings. Lungs/Pleura: No acute airspace disease, effusion, or  pneumothorax. Central airways are patent. Upper Abdomen: No acute abnormality. Musculoskeletal: Chronic healing left posterior ninth through eleventh rib fractures. No acute or destructive bony abnormalities. Reconstructed images demonstrate no additional findings. Review of the MIP images confirms the above findings. IMPRESSION: 1. No evidence of pulmonary embolus. 2. No acute intrathoracic process. 3. Aortic Atherosclerosis (ICD10-I70.0). Coronary artery atherosclerosis. Electronically Signed   By: Ozell Daring M.D.   On: 05/26/2024 15:03   DG Tibia/Fibula Left Result Date: 05/26/2024 CLINICAL DATA:  Cellulitis. EXAM: LEFT TIBIA AND FIBULA - 2 VIEW COMPARISON:  None Available. FINDINGS: No acute fracture or dislocation. The bones are mildly osteopenic. There is diffuse subcutaneous edema which may represent cellulitis. No opaque foreign object or soft tissue gas. IMPRESSION: 1. No acute fracture or dislocation. 2. Diffuse subcutaneous edema. Electronically Signed   By: Vanetta Chou M.D.   On: 05/26/2024 13:39    Microbiology: Results for orders placed or performed during the [name] encounter of 05/26/24  Culture, blood (routine x 2)     Status: None   Collection Time: 05/26/24  4:30 PM   Specimen: BLOOD RIGHT ARM  Result Value Ref Range Status   Specimen Description   Final    BLOOD RIGHT ARM Performed at [facility], [address] [place], [place] [address]    Special Requests   Final    BOTTLES DRAWN AEROBIC AND ANAEROBIC Blood Culture adequate volume Performed at [facility], [address], [place], [place] [address]    Culture   Final    NO GROWTH 5 DAYS Performed at [facility], [address], [place], [place] [address]    Report Status 05/31/2024 FINAL  Final  Culture, blood (routine x 2)     Status: None   Collection Time: 05/26/24  5:55 PM   Specimen: BLOOD RIGHT HAND  Result Value Ref Range Status   Specimen Description   Final     BLOOD RIGHT HAND Performed at [name], [address] [place], [place] [address]    Special Requests   Final    BOTTLES DRAWN AEROBIC AND ANAEROBIC Blood Culture adequate volume Performed at [name], [address] [place], [place] [address]    Culture   Final    NO GROWTH 5 DAYS Performed at [name], [address], [place], [place] [address]    Report Status 05/31/2024 FINAL  Final  Body fluid culture w Gram Stain     Status: None   Collection Time: 05/29/24  2:15 PM   Specimen: Wound; Body Fluid  Result Value Ref Range Status   Specimen Description WOUND  Final   Special Requests IN SWAB  Final   Gram Stain   Final    FEW WBC PRESENT, PREDOMINANTLY PMN FEW GRAM POSITIVE COCCI Performed at [name], [address], [place], [place] [address]    Culture FEW STAPHYLOCOCCUS AUREUS  Final   Report Status 06/01/2024 FINAL  Final   Organism ID, Bacteria STAPHYLOCOCCUS AUREUS  Final      Susceptibility   Staphylococcus aureus - MIC*    CIPROFLOXACIN >=8 RESISTANT Resistant     ERYTHROMYCIN >=8 RESISTANT Resistant     GENTAMICIN <=0.5 SENSITIVE Sensitive     OXACILLIN 2 SENSITIVE Sensitive     TETRACYCLINE >=16 RESISTANT Resistant     VANCOMYCIN  1 SENSITIVE Sensitive     TRIMETH /SULFA  >=320 RESISTANT Resistant     CLINDAMYCIN  <=0.25 SENSITIVE Sensitive     RIFAMPIN <=0.5 SENSITIVE Sensitive     Inducible Clindamycin  NEGATIVE Sensitive     LINEZOLID  2 SENSITIVE Sensitive     * FEW STAPHYLOCOCCUS AUREUS    Labs: CBC: Recent Labs  Lab 06/12/24 0923  WBC 3.1*  NEUTROABS 1.5*  HGB 9.8*  HCT 31.3*  MCV 73.3*  PLT 77*   Basic Metabolic Panel: Recent Labs  Lab 06/12/24 0923  NA 137  K 4.0  CL 101  CO2 23  GLUCOSE 107*  BUN 19  CREATININE 1.33*  CALCIUM  9.6   Liver Function Tests: Recent Labs  Lab 06/12/24 0923  AST 24  ALT 7  ALKPHOS 79  BILITOT 0.7  PROT 7.8  ALBUMIN  4.4   CBG: No results for  input(s): GLUCAP in the last 168 hours.  Discharge time spent: greater than 30 minutes.  Signed: Burnard DELENA Cunning, DO [facility] 06/14/2024

## 2024-06-01 NOTE — Progress Notes (Addendum)
 Patient called requested pain medication for LLE cellulitis pain 5/10 oxycodone  10mg  PRN q4 given and also requested the acetaminophen  650mg  for his back pain 3/10.  Virtually patient self-administered his medications with son 's assistance. No other needs verbalized at this time informed son informed we would be discharging Pt around 1530-1600.

## 2024-06-01 NOTE — Progress Notes (Signed)
 [facility]  medics Sarah and Carrington Mullenax out to see the patient this evening for discharge visit. On arrival the patient welcomed us  in at the door. Discharge paperwork was presented to and gone over with the patient in person as well as his daughter over the phone. The unna boot was applied to the (L) LLE as ordered by the provider for discharge. The pt is in good spirits and expressed his well wishes and thanks for all of the hard work this team has done throughout his [facility]  stay. The pt will get a transitional dose of antibiotics tonight from the IP pharmacy as he was unable to get to his pharmacy today. The patients daughter will pick up his other medications from [facility] tomorrow. No other tasks were needed at this time. [facility]  visit complete.

## 2024-06-01 NOTE — Progress Notes (Signed)
 Virtual visit with field team, provider, and Tax Inspector. Provider discussed discharge plan and assessed patients wound via current health device and photos uploaded.TOC pharm not available daughter agrees to send med Rx's to [facility] [address] Labs pending.

## 2024-06-01 NOTE — Progress Notes (Signed)
 0830 virtual call to patient. Reports pain is under control. Is feeling tired as received calls last night r/t bradycardia. Explained provider reviewed EKG taken yesterday and that the intermittent bradycardia is not a new finding. He is going to f/u with cardiology. Expressed there are no new needs at this time let him know field team is out and would be to him 20-30 minutes for am visit with provider.

## 2024-06-01 NOTE — Progress Notes (Deleted)
 Virtual visit patient reports pain under control. Aware may discharge today pending shared decision making with team. Expressed he currently has no new needs. Informed him team is OTW for am visit.

## 2024-06-01 NOTE — Progress Notes (Signed)
 AM IDR Possible D/C for today. EKG reviewed by provider patient's intermittent bradycardia is not new and is following up with cardiology. Possible d/c for today.  Patient refused last does of Lasix  c/o too late to take is scheduled for this am.  Patient reports pain is under control just fatigue from early morning call was unable to get back to sleep. Advised perhaps a short nap after am visit. No meds scheduled at this time

## 2024-06-01 NOTE — Progress Notes (Signed)
 [phone]--[place] contact via Video call.  Current health data alert for help addressed.  Patient sitting up in recliner alone. Patient reported pain at level 4. Patient reported pain in back and legs. Patient reported still experiencing restless leg syndrome with little relief. Medication assistance provided for three medications with [employer].  Per Patient no other questions or concerns to address at the time of video call. Patient informed Virtual RN will continue to be monitored for the remainder of shift. Patient made aware of oncoming RN and encouraged to continue to call as needed. [facility] contact information reviewed.

## 2024-06-01 NOTE — Progress Notes (Signed)
 [facility]  medics Mionna Advincula and Lauraine were out this morning to see the patient for his morning visit. On arrival the team was met at the door by the patients son who welcomed us  in. On assessment, lung sounds clear and equal, heart sounds S1, S2, and abd soft non-tender with active bowel sounds. The patient had complaints of 2/10 pain in his back and legs. The patient stated that he's been up since 3 this morning due to his restless leg syndrome. The patient did state that he was able to get on a video call with the nurse to get some of his PRN medications which he states did help. No other complaints from the patient. A video visit with the provider was conducted and completed with the RN on the line. Antibiotics and PO meds were verified against the MAR and given.   No changes in LLE in form of redness or warmth. Bandages and dressings were changed this morning per order and pictures were updated in the chart. No other tasks were needed for this visit.   Pt pending discharge today.

## 2024-06-02 ENCOUNTER — Telehealth: Payer: Self-pay | Admitting: *Deleted

## 2024-06-02 NOTE — Telephone Encounter (Signed)
 Received call from pt's daughter, Landry Ferries. She states pt missed his lab and injection appt last week as [facility] and could not come in for his scheduled appts. She is asking if he can come in this week and maintain his current appt schedule. Discussed with Dr. Federico. Dr. Federico states pt can get his epo injection and lab work done [employer] or Wed of this week and then keep his other appts as scheduled. Advised the daughter of the above and advised that our scheduler will call her appts on 12/2 or12/3 this week, keeping all his following appts. She voiced understanding.  Scheduling message sent

## 2024-06-03 ENCOUNTER — Inpatient Hospital Stay: Attending: Physician Assistant

## 2024-06-03 ENCOUNTER — Inpatient Hospital Stay

## 2024-06-03 VITALS — BP 166/85 | HR 58 | Temp 97.8°F | Resp 16

## 2024-06-03 DIAGNOSIS — D469 Myelodysplastic syndrome, unspecified: Secondary | ICD-10-CM

## 2024-06-03 DIAGNOSIS — D4620 Refractory anemia with excess of blasts, unspecified: Secondary | ICD-10-CM | POA: Diagnosis present

## 2024-06-03 LAB — CBC WITH DIFFERENTIAL (CANCER CENTER ONLY)
Abs Immature Granulocytes: 0.04 K/uL (ref 0.00–0.07)
Basophils Absolute: 0 K/uL (ref 0.0–0.1)
Basophils Relative: 1 %
Eosinophils Absolute: 0 K/uL (ref 0.0–0.5)
Eosinophils Relative: 1 %
HCT: 31.3 % — ABNORMAL LOW (ref 39.0–52.0)
Hemoglobin: 9.6 g/dL — ABNORMAL LOW (ref 13.0–17.0)
Immature Granulocytes: 1 %
Lymphocytes Relative: 25 %
Lymphs Abs: 0.7 K/uL (ref 0.7–4.0)
MCH: 23.2 pg — ABNORMAL LOW (ref 26.0–34.0)
MCHC: 30.7 g/dL (ref 30.0–36.0)
MCV: 75.6 fL — ABNORMAL LOW (ref 80.0–100.0)
Monocytes Absolute: 0.7 K/uL (ref 0.1–1.0)
Monocytes Relative: 24 %
Neutro Abs: 1.3 K/uL — ABNORMAL LOW (ref 1.7–7.7)
Neutrophils Relative %: 48 %
Platelet Count: 176 K/uL (ref 150–400)
RBC: 4.14 MIL/uL — ABNORMAL LOW (ref 4.22–5.81)
RDW: 17.4 % — ABNORMAL HIGH (ref 11.5–15.5)
WBC Count: 2.8 K/uL — ABNORMAL LOW (ref 4.0–10.5)
nRBC: 0 % (ref 0.0–0.2)

## 2024-06-03 LAB — CMP (CANCER CENTER ONLY)
ALT: 8 U/L (ref 0–44)
AST: 29 U/L (ref 15–41)
Albumin: 3.8 g/dL (ref 3.5–5.0)
Alkaline Phosphatase: 123 U/L (ref 38–126)
Anion gap: 10 (ref 5–15)
BUN: 16 mg/dL (ref 8–23)
CO2: 25 mmol/L (ref 22–32)
Calcium: 9.1 mg/dL (ref 8.9–10.3)
Chloride: 102 mmol/L (ref 98–111)
Creatinine: 1.07 mg/dL (ref 0.61–1.24)
GFR, Estimated: >60 mL/min (ref >60)
Glucose, Bld: 92 mg/dL (ref 70–99)
Potassium: 4.4 mmol/L (ref 3.5–5.1)
Sodium: 137 mmol/L (ref 135–145)
Total Bilirubin: 0.4 mg/dL (ref 0.0–1.2)
Total Protein: 7.3 g/dL (ref 6.5–8.1)

## 2024-06-03 LAB — SAMPLE TO BLOOD BANK

## 2024-06-03 MED ORDER — EPOETIN ALFA 20000 UNIT/ML IJ SOLN
20000.0000 [IU] | Freq: Once | INTRAMUSCULAR | Status: AC
  Administered 2024-06-03: 20000 [IU] via SUBCUTANEOUS
  Filled 2024-06-03: qty 1

## 2024-06-04 ENCOUNTER — Telehealth: Payer: Self-pay

## 2024-06-04 DIAGNOSIS — H43812 Vitreous degeneration, left eye: Secondary | ICD-10-CM | POA: Diagnosis not present

## 2024-06-04 DIAGNOSIS — H353221 Exudative age-related macular degeneration, left eye, with active choroidal neovascularization: Secondary | ICD-10-CM | POA: Diagnosis not present

## 2024-06-04 DIAGNOSIS — H401131 Primary open-angle glaucoma, bilateral, mild stage: Secondary | ICD-10-CM | POA: Diagnosis not present

## 2024-06-04 DIAGNOSIS — H353112 Nonexudative age-related macular degeneration, right eye, intermediate dry stage: Secondary | ICD-10-CM | POA: Diagnosis not present

## 2024-06-04 NOTE — Telephone Encounter (Signed)
 Patient's daughter called, states patient asked her to call because he's been having GI upset while on antibiotics.   She reports the pain as gassy, sour, and diarrhea.  She says the patient reported dark stool, she does not think he noticed any blood. Patient is at another doctor's appointment right now.   Patient was advised to f/u with ID PRN back in October. Appears he was started on linezolid  and cefadroxil  by hospital. He has an appointment with Dr. Dea on 12/17.  Geraldyne Barraclough, BSN, RN

## 2024-06-05 NOTE — Telephone Encounter (Signed)
 Pt daughter calling back for update. C/o nausea, mild diarrhea, and gassy pain. Started oral antbx Sunday evening. Stopped antbx for 1.5 dose since starting medication. Resumed them last night.  Would like call at [phone] (H)  Lorenda CHRISTELLA Code, RMA

## 2024-06-09 ENCOUNTER — Ambulatory Visit: Admitting: Internal Medicine

## 2024-06-09 ENCOUNTER — Encounter: Payer: Self-pay | Admitting: Internal Medicine

## 2024-06-09 VITALS — BP 118/76 | HR 66 | Ht 69.0 in

## 2024-06-09 DIAGNOSIS — G061 Intraspinal abscess and granuloma: Secondary | ICD-10-CM | POA: Diagnosis not present

## 2024-06-09 DIAGNOSIS — L03116 Cellulitis of left lower limb: Secondary | ICD-10-CM | POA: Diagnosis not present

## 2024-06-09 DIAGNOSIS — E538 Deficiency of other specified B group vitamins: Secondary | ICD-10-CM

## 2024-06-09 DIAGNOSIS — R0609 Other forms of dyspnea: Secondary | ICD-10-CM

## 2024-06-09 DIAGNOSIS — E559 Vitamin D deficiency, unspecified: Secondary | ICD-10-CM

## 2024-06-09 DIAGNOSIS — B9689 Other specified bacterial agents as the cause of diseases classified elsewhere: Secondary | ICD-10-CM | POA: Diagnosis not present

## 2024-06-09 DIAGNOSIS — I2510 Atherosclerotic heart disease of native coronary artery without angina pectoris: Secondary | ICD-10-CM

## 2024-06-09 DIAGNOSIS — L02416 Cutaneous abscess of left lower limb: Secondary | ICD-10-CM

## 2024-06-09 DIAGNOSIS — I10 Essential (primary) hypertension: Secondary | ICD-10-CM

## 2024-06-09 NOTE — Assessment & Plan Note (Signed)
 On vit D

## 2024-06-09 NOTE — Assessment & Plan Note (Signed)
 Doing fair. Cont to walk

## 2024-06-09 NOTE — Assessment & Plan Note (Signed)
 On Vit B12

## 2024-06-09 NOTE — Assessment & Plan Note (Signed)
 No angina

## 2024-06-09 NOTE — Assessment & Plan Note (Signed)
 Healing - finishing abx today

## 2024-06-09 NOTE — Assessment & Plan Note (Signed)
 Labs w/Dr Leonides Schanz New decrease in GFR 49; LLE>RLE edema. Now his L hand is swollen, bruised (no DVT). On biweekly Retacrit  inj for MDS. Probably Retacrit side effect vs other. Amlodipine may be contributing in the edema.  F/u w/Dr Leonides Schanz RLE 1+, LLE 2+ edema L hand w/edema Hydrate well

## 2024-06-09 NOTE — Patient Instructions (Signed)
 [name]

## 2024-06-09 NOTE — Assessment & Plan Note (Signed)
 OP ID f/u -- pending

## 2024-06-09 NOTE — Progress Notes (Signed)
 Subjective:  Patient ID: Justin ORN Randine Raddle., male    DOB: 1940-11-24  Age: 83 y.o. MRN: [id number]  CC: Hospitalization Follow-up [name] follow up 11/24-11/30 for cellulitis of left lower extremity. New poor appetite believed to be from antibiotic use)   HPI Justin Evitt [employer]. presents for cellulitis - post-hospital visit F/u on HTN, RLS, B12 def. Dtr Justin [name] is w/him   Outpatient Medications Prior to Visit  Medication Sig Dispense Refill   acetaminophen  (TYLENOL ) 500 MG tablet Take 1,000 mg by mouth every 6 (six) hours as needed for mild pain (pain score 1-3).     amLODipine  (NORVASC ) 5 MG tablet TAKE 1 TABLET BY MOUTH DAILY (Patient taking differently: Take 5 mg by mouth at bedtime.) 90 tablet 3   artificial tears ophthalmic solution Place 1 drop into both eyes as needed for dry eyes (At night).     calcium  carbonate (TUMS EX) 750 MG chewable tablet Chew 1 tablet by mouth 2 (two) times daily as needed for heartburn.     carbidopa -levodopa  (SINEMET  IR) 25-100 MG tablet For breakthrough restless leg, you can take 1 tablet at bedtime.  OK to take extra tab as needed during the day. (Patient taking differently: Take 1 tablet by mouth at bedtime.) 100 tablet 3   Cholecalciferol  (VITAMIN D3) 1000 units CAPS Take 1,000 Units by mouth in the morning and at bedtime.     Cyanocobalamin  (VITAMIN B-12) 1000 MCG SUBL DISSOLVE 2 TABLETS IN MOUTH EVERY DAY (Patient taking differently: Place 2,000 mcg under the tongue daily.) 180 tablet 1   finasteride  (PROSCAR ) 5 MG tablet TAKE 1 TABLET (5 MG TOTAL) BY MOUTH DAILY. 90 tablet 1   furosemide  (LASIX ) 20 MG tablet Take 1 tablet (20 mg total) by mouth daily. 30 tablet 0   gabapentin  (NEURONTIN ) 300 MG capsule Take 1 capsule (300 mg total) by mouth 3 (three) times daily. 270 capsule 2   ibuprofen (ADVIL) 200 MG tablet Take 400 mg by mouth every 8 (eight) hours as needed for mild pain (pain score 1-3) (if not taking Tylenol ).     magnesium  hydroxide  (MILK OF MAGNESIA) 400 MG/5ML suspension Take 15 mLs by mouth daily as needed for mild constipation.     Multiple Vitamins-Minerals (PRESERVISION AREDS PO) Take 1 capsule by mouth 2 (two) times daily.     mupirocin  ointment (BACTROBAN ) 2 % Apply 1 Application topically 2 (two) times daily. (Patient taking differently: Apply 1 Application topically See admin instructions. Apply to affected toe on the right foot once a day) 30 g 2   ondansetron  (ZOFRAN ) 4 MG tablet Take 1 tablet (4 mg total) by mouth every 6 (six) hours as needed for nausea or vomiting. 30 tablet 0   Oxycodone  HCl 10 MG TABS Take 1 tablet (10 mg total) by mouth every 4 (four) hours as needed (pain).     Polyethyl Glycol-Propyl Glycol (SYSTANE OP) Place 1 drop into both eyes 2 (two) times daily as needed (for dryness).     pravastatin  (PRAVACHOL ) 20 MG tablet TAKE 1 TABLET (20 MG TOTAL) BY MOUTH DAILY. 90 tablet 1   rOPINIRole  (REQUIP ) 2 MG tablet TAKE 1 TABLET BY MOUTH AT AT NOON AND 1 TAB AT 4PM AND 1 TAB AT BEDTIME (Patient taking differently: Take 2 mg by mouth 2 (two) times daily as needed (for restless legs).) 270 tablet 2   tamsulosin  (FLOMAX ) 0.4 MG CAPS capsule TAKE 1 CAPSULE (0.4 MG TOTAL) BY MOUTH IN THE MORNING. 90 capsule  1   terazosin  (HYTRIN ) 2 MG capsule TAKE 1 CAPSULE BY MOUTH EVERY NIGHT AT BEDTIME 90 capsule 1   No facility-administered medications prior to visit.    ROS: Review of Systems  Constitutional:  Negative for appetite change, fatigue and unexpected weight change.  HENT:  Negative for congestion, nosebleeds, sneezing, sore throat and trouble swallowing.   Eyes:  Negative for itching and visual disturbance.  Respiratory:  Negative for cough.   Cardiovascular:  Negative for chest pain, palpitations and leg swelling.  Gastrointestinal:  Negative for abdominal distention, blood in stool, diarrhea and nausea.  Genitourinary:  Negative for frequency and hematuria.  Musculoskeletal:  Positive for  arthralgias, back pain and gait problem. Negative for joint swelling and neck pain.  Skin:  Negative for rash.  Neurological:  Negative for dizziness, tremors, speech difficulty and weakness.  Psychiatric/Behavioral:  Negative for agitation, dysphoric mood, sleep disturbance and suicidal ideas. The patient is not nervous/anxious.     Objective:  BP 118/76   Pulse 66   Ht 5' 9 (1.753 m)   SpO2 98%   BMI 22.60 kg/m   BP Readings from Last 3 Encounters:  06/09/24 118/76  06/03/24 (!) 166/85  06/01/24 138/71    Wt Readings from Last 3 Encounters:  05/28/24 153 lb 1 oz (69.4 kg)  05/26/24 150 lb (68 kg)  05/15/24 149 lb 8 oz (67.8 kg)    Physical Exam Constitutional:      General: He is not in acute distress.    Appearance: He is well-developed.     Comments: NAD  HENT:     Nose: No congestion.  Eyes:     Conjunctiva/sclera: Conjunctivae normal.     Pupils: Pupils are equal, round, and reactive to light.  Neck:     Thyroid : No thyromegaly.     Vascular: No JVD.  Cardiovascular:     Rate and Rhythm: Normal rate and regular rhythm.     Heart sounds: Normal heart sounds. No murmur heard.    No friction rub. No gallop.  Pulmonary:     Effort: Pulmonary effort is normal. No respiratory distress.     Breath sounds: Normal breath sounds. No wheezing or rales.  Chest:     Chest wall: No tenderness.  Abdominal:     General: Bowel sounds are normal. There is no distension.     Palpations: Abdomen is soft. There is no mass.     Tenderness: There is no abdominal tenderness. There is no guarding or rebound.  Musculoskeletal:        General: No tenderness. Normal range of motion.     Cervical back: Normal range of motion.     Right lower leg: Edema present.     Left lower leg: No edema.  Lymphadenopathy:     Cervical: No cervical adenopathy.  Skin:    General: Skin is warm and dry.     Findings: No rash.  Neurological:     Mental Status: He is alert and oriented to  person, place, and time.     Cranial Nerves: No cranial nerve deficit.     Motor: No abnormal muscle tone.     Coordination: Coordination normal.     Gait: Gait normal.     Deep Tendon Reflexes: Reflexes are normal and symmetric.  Psychiatric:        Behavior: Behavior normal.        Thought Content: Thought content normal.        Judgment:  Judgment normal.    LLE w./healing wound at the ankle Purple skin L shin  Per hx:  [facility] -- Physician Discharge Summary    Patient: Justin [name]. MRN: [id number] DOB: [birth year]  Admit date:     05/26/2024  Discharge date: 06/01/24  Discharge Physician: Burnard DELENA Cunning    PCP: Garald Karlynn GAILS, MD      Patient identified themself as Justin ORN Randine Raddle.  DOB May 11, 1941  Medics Lauraine Faes and Consuelo Kerns present in the home during encounter and performed the physical exam. Patient was seen today via video chat; my physical location [facility]       Recommendations at discharge:   Follow up with PCP in 1 week Follow up with Infectious Disease in 1-2 weeks Follow pending wound culture - growing staph aureus with susceptibilities pending at time of d/c Follow up with Cardiology regarding slow heart rates Repeat CBC, BMP in 1 week  Continue wound care to right lower extremity wound as below   Cleanse LLE wound with saline, pat dry Cover with piece of silver hydrofiber and top with foam.    Unna's boots they can be applied directly over this dressing and changed only when Unna's boot changes (typically 2x week)   05/30/24 1452         Discharge Diagnoses: Principal Problem:   Left leg cellulitis Active Problems:   RLS (restless legs syndrome)   Low back pain   BPH (benign prostatic hyperplasia)   Essential hypertension   MDS (myelodysplastic syndrome) (HCC)   Lumbar radiculopathy   Resolved Problems:   * No resolved hospital problems. *   Hospital Course: Justin Tapley Hammond Raddle. is an 83 y.o.  male with medical history significant for hypertension, CAD, remote alcoholism, MDS, and recurrent skin and soft tissue infections who presented on 05/26/2024 for evaluation of redness, pain, and swelling involving the lower left leg, present and progressively worsening over 4 days.  He also developed increased chills and general malaise.   ED Course: Upon arrival to the ED, patient afebrile and saturating well on room air with normal HR and stable BP.  Labs are most notable for creatinine 1.38, normal WBC, normal lactic acid, hemoglobin 10.4, and platelets 37,000.  There is no DVT on the left lower extremity venous Doppler.  CTA chest is negative for PE or other acute finding.  There is diffuse subcutaneous edema without fracture or dislocation on plain radiographs of the left tib/fib.   Blood cultures were collected in the ED and the patient was treated with cefepime .   11/24 -- admitted, continued on IV cefazolin    11/25 -- continued on cefazolin .  Leg with some purulent drainage  Transferred to [facility] program.   11/26 -- doing well at home, wound on lower leg draining some.  Overall slowly improving. Continued on cefazolin .   11/27 --persistent erythema and swelling with associated pain.  Erythema appears to be an evolving and darkening related to venous stasis.  Infectious disease reviewed and made recommendations as below.   11/28 -- erythema persistent but differential warmth resolved, swelling improving.  Wound cx few staph aureus. Added PO Zynox given evening visit Medic observed some extension beyond ink margin.     11/29 -- improvement in erythema and swelling today. Wound culture pending.   Assessment and Plan: * Left leg cellulitis 11/29 -- erythema and swelling improved with gentle compression and adding zyvox .  Some +differential warmth but improved.  ABI's normal  Venous U/S negative for DVT --Appreciate wound care RN's recs --Continue IV cefazolin  --Continue PO  Zyvox  until culture --Follow wound culture obtained 11/27 - growing few staph aureus with susceptibilities pending -- Continue local wound care --Elevate leg when at rest --Start compression with ace wraps --Will have Unna boots placed prior to d/c --[facility] RN for wound care & unna boot changes after discharge   Appreciate infectious disease input 11/27:  -- Recommend continue cefazolin  while admitted --Discharge on cefadroxil  and follow-up in ID clinic --Obtain swab for culture of pustule fluid from wound if able --Feels unlikely MRSA given overall clinical improvement on current therapy   MDS (myelodysplastic syndrome) (HCC) Follows with Dr. Federico, on Retacrit  every 2 weeks.  Has upcoming appt this week Friday.   11/29: WBC 4.1, hemoglobin 8.9, platelets 117 improved --Monitor CBC   Essential hypertension 11/29: BP stable --Continue amlodipine  5 mg    BPH (benign prostatic hyperplasia) 11/29 -- Continue home terazosin , Proscar  and Flomax    Low back pain 11/29 - stable --On home pain regimen   RLS (restless legs syndrome) 11/29--stable, no acute issues --Continue Requip , gabapentin , Sinemet  --Follow up with Neurology outpatient as scheudled   Lumbar radiculopathy --On home pain regimen        In a w/c   Lab Results  Component Value Date   WBC 2.8 (L) 06/03/2024   HGB 9.6 (L) 06/03/2024   HCT 31.3 (L) 06/03/2024   PLT 176 06/03/2024   GLUCOSE 92 06/03/2024   CHOL 136 01/12/2017   TRIG 67.0 01/12/2017   HDL 54.70 01/12/2017   LDLCALC 68 01/12/2017   ALT 8 06/03/2024   AST 29 06/03/2024   NA 137 06/03/2024   K 4.4 06/03/2024   CL 102 06/03/2024   CREATININE 1.07 06/03/2024   BUN 16 06/03/2024   CO2 25 06/03/2024   TSH 3.05 10/16/2019   PSA 0.47 08/30/2016   INR 1.1 10/28/2020   HGBA1C 5.3 04/19/2021    CT TIBIA FIBULA LEFT W CONTRAST Result Date: 05/27/2024 EXAM: CT LEFT LOWER EXTREMITY, WITH IV CONTRAST 05/27/2024 11:24:59 AM TECHNIQUE: Axial images  were acquired through the left lower extremity with IV contrast. Reformatted images were reviewed. Automated exposure control, iterative reconstruction, and/or weight based adjustment of the mA/kV was utilized to reduce the radiation dose to as low as reasonably achievable. COMPARISON: Radiographs 05/26/2024. CLINICAL HISTORY: Soft tissue infection suspected, lower leg. FINDINGS: BONES AND JOINTS: No acute fracture or focal osseous lesion. No bony destructive findings characteristic of osteomyelitis. No dislocation. The joint spaces are normal. SOFT TISSUES: Circumferential subcutaneous edema in the calf especially distally and extending to overlying the medial and lateral malleoli. Edema tracks along the superficial fascial margin of the medial head left gastrocnemius but no drainable fluid collection is identified. Suspected cutaneous blistering medially along the distal calf about 8.5 cm proximal to the tibiotalar joint. Mild cutaneous irregularity just above this level medially as on image 264 series 10. IMPRESSION: 1. Circumferential subcutaneous edema in the left calf, especially distally, with suspected medial distal calf cutaneous blistering; no drainable fluid collection identified. Electronically signed by: Ryan Salvage MD 05/27/2024 11:39 AM EST RP Workstation: [id number]   VAS US  LOWER EXTREMITY VENOUS (DVT) (ONLY [facility] & [facility]) Result Date: 05/26/2024  Lower Venous DVT Study Patient Name:  Justin [name].  Date of Exam:   05/26/2024 Medical Rec #: [id number]               Accession #:  [id number] Date of Birth: January 23, 1941               Patient Gender: M Patient Age:   62 years Exam Location:  [facility] Procedure:      VAS US  LOWER EXTREMITY VENOUS (DVT) Referring Phys: ALEXIS YOUNG --------------------------------------------------------------------------------  Indications: Pain, Swelling, and Erythema. Other Indications: Cellulitis. Risk Factors: Myelodysplastic syndrome.  Limitations: Poor ultrasound/tissue interface. Comparison Study: Previous exam on 11/15/2023 was negative for DVT Performing Technologist: Alberta Lis RVS  Examination Guidelines: A complete evaluation includes B-mode imaging, spectral Doppler, color Doppler, and power Doppler as needed of all accessible portions of each vessel. Bilateral testing is considered an integral part of a complete examination. Limited examinations for reoccurring indications may be performed as noted. The reflux portion of the exam is performed with the patient in reverse Trendelenburg.  +-----+---------------+---------+-----------+----------+--------------+ RIGHTCompressibilityPhasicitySpontaneityPropertiesThrombus Aging +-----+---------------+---------+-----------+----------+--------------+ CFV  Full           Yes      Yes                                 +-----+---------------+---------+-----------+----------+--------------+   +---------+---------------+---------+-----------+----------+-------------------+ LEFT     CompressibilityPhasicitySpontaneityPropertiesThrombus Aging      +---------+---------------+---------+-----------+----------+-------------------+ CFV      Full           Yes      Yes                                      +---------+---------------+---------+-----------+----------+-------------------+ SFJ      Full                                                             +---------+---------------+---------+-----------+----------+-------------------+ FV Prox  Full                                                             +---------+---------------+---------+-----------+----------+-------------------+ FV Mid   Full                                                             +---------+---------------+---------+-----------+----------+-------------------+ FV DistalFull                                                              +---------+---------------+---------+-----------+----------+-------------------+ PFV      Full                                                             +---------+---------------+---------+-----------+----------+-------------------+  POP      Full           Yes      Yes                                      +---------+---------------+---------+-----------+----------+-------------------+ PTV      Full                                                             +---------+---------------+---------+-----------+----------+-------------------+ PERO                                                  Not well visualized +---------+---------------+---------+-----------+----------+-------------------+     Summary: RIGHT: - No evidence of common femoral vein obstruction.   LEFT: - There is no evidence of deep vein thrombosis in the lower extremity.  - No cystic structure found in the popliteal fossa. Subcutaneous in area of calf.  *See table(s) above for measurements and observations. Electronically signed by Lonni Gaskins MD on 05/26/2024 at 5:00:15 PM.    Final    CT Angio Chest PE W and/or Wo Contrast Result Date: 05/26/2024 CLINICAL DATA:  Left leg swelling and pain, myelodysplastic syndrome, right rib pain EXAM: CT ANGIOGRAPHY CHEST WITH CONTRAST TECHNIQUE: Multidetector CT imaging of the chest was performed using the standard protocol during bolus administration of intravenous contrast. Multiplanar CT image reconstructions and MIPs were obtained to evaluate the vascular anatomy. RADIATION DOSE REDUCTION: This exam was performed according to the departmental dose-optimization program which includes automated exposure control, adjustment of the mA and/or kV according to patient size and/or use of iterative reconstruction technique. CONTRAST:  75mL OMNIPAQUE  IOHEXOL  350 MG/ML SOLN COMPARISON:  10/28/2022, 07/23/2018 FINDINGS: Cardiovascular: This is a technically adequate evaluation of  the pulmonary vasculature. No filling defects or pulmonary emboli. The heart is unremarkable without pericardial effusion. No evidence of thoracic aortic aneurysm or dissection. Atherosclerosis of the aorta and coronary vasculature. Mediastinum/Nodes: No enlarged mediastinal, hilar, or axillary lymph nodes. Thyroid  gland, trachea, and esophagus demonstrate no significant findings. Lungs/Pleura: No acute airspace disease, effusion, or pneumothorax. Central airways are patent. Upper Abdomen: No acute abnormality. Musculoskeletal: Chronic healing left posterior ninth through eleventh rib fractures. No acute or destructive bony abnormalities. Reconstructed images demonstrate no additional findings. Review of the MIP images confirms the above findings. IMPRESSION: 1. No evidence of pulmonary embolus. 2. No acute intrathoracic process. 3. Aortic Atherosclerosis (ICD10-I70.0). Coronary artery atherosclerosis. Electronically Signed   By: Ozell Daring M.D.   On: 05/26/2024 15:03   DG Tibia/Fibula Left Result Date: 05/26/2024 CLINICAL DATA:  Cellulitis. EXAM: LEFT TIBIA AND FIBULA - 2 VIEW COMPARISON:  None Available. FINDINGS: No acute fracture or dislocation. The bones are mildly osteopenic. There is diffuse subcutaneous edema which may represent cellulitis. No opaque foreign object or soft tissue gas. IMPRESSION: 1. No acute fracture or dislocation. 2. Diffuse subcutaneous edema. Electronically Signed   By: Vanetta Chou M.D.   On: 05/26/2024 13:39    Assessment & Plan:   Problem List Items Addressed This Visit     Abscess of  epidural space of spine due to bacteria   OP ID f/u -- pending      Cellulitis and abscess of left leg   Healing - finishing abx today      Coronary artery disease - Primary   No angina       DOE (dyspnea on exertion)   Doing fair. Cont to walk      Essential hypertension   Labs w/Dr Federico New decrease in GFR 49; LLE>RLE edema. Now his L hand is swollen, bruised (no  DVT). On biweekly Retacrit   inj for MDS. Probably Retacrit  side effect vs other. Amlodipine  may be contributing in the edema.  F/u w/Dr Federico RLE 1+, LLE 2+ edema L hand w/edema Hydrate well      Vitamin B12 deficiency   On Vit B12      Vitamin D  deficiency   On vit D         No orders of the defined types were placed in this encounter.     Follow-up: Return in about 6 weeks (around 07/21/2024) for a follow-up visit.  Marolyn Noel, MD

## 2024-06-12 ENCOUNTER — Inpatient Hospital Stay

## 2024-06-12 ENCOUNTER — Telehealth: Payer: Self-pay

## 2024-06-12 VITALS — BP 152/82 | HR 98 | Temp 97.8°F | Resp 16

## 2024-06-12 DIAGNOSIS — D469 Myelodysplastic syndrome, unspecified: Secondary | ICD-10-CM

## 2024-06-12 DIAGNOSIS — D4620 Refractory anemia with excess of blasts, unspecified: Secondary | ICD-10-CM | POA: Diagnosis not present

## 2024-06-12 LAB — CBC WITH DIFFERENTIAL (CANCER CENTER ONLY)
Abs Immature Granulocytes: 0.01 K/uL (ref 0.00–0.07)
Basophils Absolute: 0 K/uL (ref 0.0–0.1)
Basophils Relative: 0 %
Eosinophils Absolute: 0 K/uL (ref 0.0–0.5)
Eosinophils Relative: 0 %
HCT: 31.3 % — ABNORMAL LOW (ref 39.0–52.0)
Hemoglobin: 9.8 g/dL — ABNORMAL LOW (ref 13.0–17.0)
Immature Granulocytes: 0 %
Lymphocytes Relative: 29 %
Lymphs Abs: 0.9 K/uL (ref 0.7–4.0)
MCH: 23 pg — ABNORMAL LOW (ref 26.0–34.0)
MCHC: 31.3 g/dL (ref 30.0–36.0)
MCV: 73.3 fL — ABNORMAL LOW (ref 80.0–100.0)
Monocytes Absolute: 0.7 K/uL (ref 0.1–1.0)
Monocytes Relative: 21 %
Neutro Abs: 1.5 K/uL — ABNORMAL LOW (ref 1.7–7.7)
Neutrophils Relative %: 50 %
Platelet Count: 77 K/uL — ABNORMAL LOW (ref 150–400)
RBC: 4.27 MIL/uL (ref 4.22–5.81)
RDW: 18.2 % — ABNORMAL HIGH (ref 11.5–15.5)
WBC Count: 3.1 K/uL — ABNORMAL LOW (ref 4.0–10.5)
nRBC: 0 % (ref 0.0–0.2)

## 2024-06-12 LAB — CMP (CANCER CENTER ONLY)
ALT: 7 U/L (ref 0–44)
AST: 24 U/L (ref 15–41)
Albumin: 4.4 g/dL (ref 3.5–5.0)
Alkaline Phosphatase: 79 U/L (ref 38–126)
Anion gap: 13 (ref 5–15)
BUN: 19 mg/dL (ref 8–23)
CO2: 23 mmol/L (ref 22–32)
Calcium: 9.6 mg/dL (ref 8.9–10.3)
Chloride: 101 mmol/L (ref 98–111)
Creatinine: 1.33 mg/dL — ABNORMAL HIGH (ref 0.61–1.24)
GFR, Estimated: 52 mL/min — ABNORMAL LOW (ref >60)
Glucose, Bld: 107 mg/dL — ABNORMAL HIGH (ref 70–99)
Potassium: 4 mmol/L (ref 3.5–5.1)
Sodium: 137 mmol/L (ref 135–145)
Total Bilirubin: 0.7 mg/dL (ref 0.0–1.2)
Total Protein: 7.8 g/dL (ref 6.5–8.1)

## 2024-06-12 LAB — SAMPLE TO BLOOD BANK

## 2024-06-12 MED ORDER — EPOETIN ALFA 20000 UNIT/ML IJ SOLN
20000.0000 [IU] | Freq: Once | INTRAMUSCULAR | Status: AC
  Administered 2024-06-12: 20000 [IU] via SUBCUTANEOUS
  Filled 2024-06-12: qty 1

## 2024-06-12 NOTE — Telephone Encounter (Signed)
 Copied from CRM #[id number]. Topic: General - Other >> Jun 12, 2024  4:08 PM Rosina BIRCH wrote: Reason for CRM: laura from [facility] called stating they are discontinuing the patient unna boot to his left lower leg. The patient need compression socks because the wound is healed but they need direction on how to prevent any other ulcers from the compression socks. Patient need to know where to get the compression socks and if he need to be fitted for them. Leita would like the office to call the patient Laura-[phone]

## 2024-06-12 NOTE — Progress Notes (Signed)
 Ok to treat today with Epogen  - a couple days early.  Bridgett Leach [place], [place], BCPS, BCOP 06/12/2024 10:31 AM

## 2024-06-13 NOTE — Telephone Encounter (Signed)
 OK. Thx

## 2024-06-17 ENCOUNTER — Ambulatory Visit: Attending: Physician Assistant | Admitting: Physician Assistant

## 2024-06-17 ENCOUNTER — Encounter: Payer: Self-pay | Admitting: Physician Assistant

## 2024-06-17 ENCOUNTER — Ambulatory Visit

## 2024-06-17 VITALS — BP 132/66 | HR 43 | Ht 69.0 in | Wt 143.6 lb

## 2024-06-17 DIAGNOSIS — R001 Bradycardia, unspecified: Secondary | ICD-10-CM

## 2024-06-17 NOTE — Patient Instructions (Addendum)
 Medication Instructions:  Lasix  20 mg as needed daily  *If you need a refill on your cardiac medications before your next appointment, please call your pharmacy*  Lab Work: NONE ordered at this time of appointment   Testing/Procedures: Justin [name]- Long Term Monitor Instructions  Your physician has requested you wear a ZIO patch monitor for 14 days.  This is a single patch monitor. [employer] supplies one patch monitor per enrollment. Additional stickers are not available. Please do not apply patch if you will be having a Nuclear Stress Test,  Echocardiogram, Cardiac CT, MRI, or Chest Xray during the period you would be wearing the  monitor. The patch cannot be worn during these tests. You cannot remove and re-apply the  ZIO XT patch monitor.  Your ZIO patch monitor will be mailed 3 day USPS to your address on file. It may take 3-5 days  to receive your monitor after you have been enrolled.  Once you have received your monitor, please review the enclosed instructions. Your monitor  has already been registered assigning a specific monitor serial # to you.  Billing and Patient Assistance Program Information  We have supplied [employer] with any of your insurance information on file for billing purposes. [employer] offers a sliding scale Patient Assistance Program for patients that do not have  insurance, or whose insurance does not completely cover the cost of the ZIO monitor.  You must apply for the Patient Assistance Program to qualify for this discounted rate.  To apply, please call [employer] at [phone], select option 4, select option 2, ask to apply for  Patient Assistance Program. Justin [name] will ask your household income, and how many people  are in your household. They will quote your out-of-pocket cost based on that information.  [employer] will also be able to set up a 47-month, interest-free payment plan if needed.  Applying the monitor   Shave hair from upper left chest.  Hold abrader  disc by orange tab. Rub abrader in 40 strokes over the upper left chest as  indicated in your monitor instructions.  Clean area with 4 enclosed alcohol  pads. Let dry.  Apply patch as indicated in monitor instructions. Patch will be placed under collarbone on left  side of chest with arrow pointing upward.  Rub patch adhesive wings for 2 minutes. Remove white label marked 1. Remove the white  label marked 2. Rub patch adhesive wings for 2 additional minutes.  While looking in a mirror, press and release button in center of patch. A small green light will  flash 3-4 times. This will be your only indicator that the monitor has been turned on.  Do not shower for the first 24 hours. You may shower after the first 24 hours.  Press the button if you feel a symptom. You will hear a small click. Record Date, Time and  Symptom in the Patient Logbook.  When you are ready to remove the patch, follow instructions on the last 2 pages of Patient  Logbook. Stick patch monitor onto the last page of Patient Logbook.  Place Patient Logbook in the blue and white box. Use locking tab on box and tape box closed  securely. The blue and white box has prepaid postage on it. Please place it in the mailbox as  soon as possible. Your physician should have your test results approximately 7 days after the  monitor has been mailed back to [facility].  Call [facility] Customer Care at [phone] if you have questions regarding  your ZIO XT patch monitor. Call them immediately if you see an orange light blinking on your  monitor.  If your monitor falls off in less than 4 days, contact our Monitor department at [phone].  If your monitor becomes loose or falls off after 4 days call [employer] at [phone] for  suggestions on securing your monitor   Follow-Up: At [facility], you and your health needs are our priority.  As part of our continuing mission to provide you with exceptional heart  care, our providers are all part of one team.  This team includes your primary Cardiologist (physician) and Advanced Practice Providers or APPs (Physician Assistants and Nurse Practitioners) who all work together to provide you with the care you need, when you need it.  Your next appointment:   6 week(s)  Provider:   Scot Ford PA    We recommend signing up for the patient portal called MyChart.  Sign up information is provided on this After Visit Summary.  MyChart is used to connect with patients for Virtual Visits (Telemedicine).  Patients are able to view lab/test results, encounter notes, upcoming appointments, etc.  Non-urgent messages can be sent to your provider as well.   To learn more about what you can do with MyChart, go to [email or web address].

## 2024-06-17 NOTE — Progress Notes (Unsigned)
Enrolled patient for a 14 day Zio XT monitor to be mailed to patients home  Justin [name] to read

## 2024-06-17 NOTE — Progress Notes (Unsigned)
 Cardiology Office Note   Date:  06/18/2024  ID:  Justin [name]., DOB September 01, 1940, MRN [id number] PCP: Garald Karlynn GAILS, MD  [facility] Providers Cardiologist:  Soyla DELENA Merck, MD     History of Present Illness Justin [name]. is a 83 y.o. male with past medical history of TIA, GERD, macular degeneration, coronary artery calcification and myelodysplastic syndrome.  Lexiscan  Myoview  in February 2019 was low risk with no ischemia.  Coronary calcium  scoring test in January 2020 showed a coronary calcium  score of 41 which placed the patient in 15th percentile.  Echocardiogram in April 2021 showed EF 60 to 65%, no regional wall motion abnormality, mild concentric LVH, grade 2 DD, normal RV, mildly elevated PASP, moderate LAE and trivial MR.  Lower extremity Doppler in January 2025 was negative for DVT.  He was last seen by Lum Organ, NP in March 2025, at which time he had lower extremity edema, left greater than right.  He was given as needed dose of Lasix  and recommended to proceed with outpatient echocardiogram.  Subsequent echocardiogram obtained on 10/16/2023 showed EF 55 to 60%, no regional wall motion abnormality, normal RV, mild MR, moderate LAE, trivial AI.  He was admitted in April 2025 was acute on chronic left elbow pain concerning for septic arthritis.  He was treated with IV Ancef  for MSSA bacteremia.  TEE performed during the hospitalization showed no sign of vegetation.  Hospital course complicated by anemia with hemoglobin down to 6.9 related to myelodysplastic syndrome, he was treated with 2 units of packed red blood cell.  He was discharged on PICC line to receive 8 weeks of IV antibiotic.  He was last seen by infectious disease service as outpatient August 2025.  He was readmitted near August 2025 with left lower extremity cellulitis and was treated with linezolid  by infectious disease service.  More recently, patient was readmitted in November 2025 with  left leg cellulitis.  ABI was normal.  Venous Doppler showed no sign of DVT.  CTA was negative for PE or other acute finding.  Leg x-ray showed diffuse subcutaneous edema without fracture or dislocation.  He required IV cefazolin  during the hospitalization.  Leg wound treated with Unna boot and Ace wrap.  He was noted to be bradycardic during the hospitalization and recommended to follow-up with cardiology service as outpatient.  Patient presents today for cardiology evaluation of bradycardia that was noted during the recent hospitalization.  EKG from the hospitalization did not show any bradycardia.  On arrival, pulse oximeter recorded heart rate of 43 bpm, however on physical exam, heart rate is clearly faster.  Subsequent EKG shows he is in sinus rhythm with PACs but underlying heart rate of 75 bpm.  He does have fatigue at baseline however he has a reason to be fatigued given underlying anemia and repeated hospitalization.  I recommended a 2-week heart monitor to assess for bradycardia.  I will see the patient back in 6 weeks for reassessment.   ROS:   He denies chest pain, palpitations, dyspnea, pnd, orthopnea, n, v, dizziness, syncope, edema, weight gain, or early satiety. All other systems reviewed and are otherwise negative except as noted above.    Studies Reviewed EKG Interpretation Date/Time:  Tuesday June 17 2024 10:10:10 EST Ventricular Rate:  75 PR Interval:  158 QRS Duration:  96 QT Interval:  418 QTC Calculation: 466 R Axis:   -21  Text Interpretation: Sinus rhythm with Premature atrial complexes in a pattern of bigeminy  Minimal voltage criteria for LVH, may be normal variant ( R in aVL ) Septal infarct , age undetermined When compared with ECG of 01-Jun-2024 00:13, Incomplete right bundle branch block is no longer Present Confirmed by Anyelo Mccue [phone]) on 06/17/2024 10:12:12 AM    Cardiac Studies & Procedures    ______________________________________________________________________________________________   STRESS TESTS  MYOCARDIAL PERFUSION IMAGING 08/08/2017  Interpretation Summary  Nuclear stress EF: 60%. There were no significant wall motion abnormalities.  There was no ST segment deviation noted during stress.  Defect 1: There is a medium defect of mild severity present in the basal inferior, mid inferolateral, apical inferior and apical lateral location. This defect is consistent with diaphragmatic attenuation artifact.  This is a low risk study. No significant ischemia identified.  Oneil Parchment, MD   ECHOCARDIOGRAM  ECHOCARDIOGRAM COMPLETE 10/16/2023  Narrative ECHOCARDIOGRAM REPORT    Patient Name:   Justin [name]. Date of Exam: 10/16/2023 Medical Rec #:  [id number]              Height:       69.0 in Accession #:    [id number]             Weight:       162.4 lb Date of Birth:  1941/05/05              BSA:          1.891 m Patient Age:    82 years               BP:           112/66 mmHg Patient Gender: M                      HR:           77 bpm. Exam Location:  Outpatient  Procedure: 2D Echo, 3D Echo, Cardiac Doppler, Color Doppler and Strain Analysis (Both Spectral and Color Flow Doppler were utilized during procedure).  Indications:    R60.0 Lower extremity edema  History:        Patient has prior history of Echocardiogram examinations, most recent 10/22/2019. CAD, Signs/Symptoms:Edema; Risk Factors:Hypertension and Former Smoker. Patient denies chest pain and SOB. He does have bilateral leg edema.  Sonographer:    Annabella Cater RVT, RDCS (AE), RDMS Referring Phys: [id number] MAD[place] L F[place]  IMPRESSIONS   1. Left ventricular ejection fraction, by estimation, is 55 to 60%. The left ventricle has normal function. The left ventricle has no regional wall motion abnormalities. Left ventricular diastolic parameters were normal. The average left ventricular global  longitudinal strain is -18.7 %. The global longitudinal strain is normal. 2. Right ventricular systolic function is normal. The right ventricular size is normal. 3. Left atrial size was moderately dilated. 4. The mitral valve is abnormal. Mild mitral valve regurgitation. No evidence of mitral stenosis. 5. The aortic valve is tricuspid. There is mild calcification of the aortic valve. There is mild thickening of the aortic valve. Aortic valve regurgitation is trivial. Aortic valve sclerosis is present, with no evidence of aortic valve stenosis. 6. The inferior vena cava is dilated in size with >50% respiratory variability, suggesting right atrial pressure of 8 mmHg.  Comparison(s): EF 60%, mild LVH.  FINDINGS Left Ventricle: Left ventricular ejection fraction, by estimation, is 55 to 60%. The left ventricle has normal function. The left ventricle has no regional wall motion abnormalities. The average left ventricular global longitudinal strain is -18.7 %.  Strain was performed and the global longitudinal strain is normal. The left ventricular internal cavity size was normal in size. There is no left ventricular hypertrophy. Left ventricular diastolic parameters were normal.  Right Ventricle: The right ventricular size is normal. No increase in right ventricular wall thickness. Right ventricular systolic function is normal.  Left Atrium: Left atrial size was moderately dilated.  Right Atrium: Right atrial size was normal in size.  Pericardium: There is no evidence of pericardial effusion.  Mitral Valve: The mitral valve is abnormal. There is moderate thickening of the mitral valve leaflet(s). Mild mitral valve regurgitation. No evidence of mitral valve stenosis.  Tricuspid Valve: The tricuspid valve is normal in structure. Tricuspid valve regurgitation is mild . No evidence of tricuspid stenosis.  Aortic Valve: The aortic valve is tricuspid. There is mild calcification of the aortic valve.  There is mild thickening of the aortic valve. Aortic valve regurgitation is trivial. Aortic valve sclerosis is present, with no evidence of aortic valve stenosis. Aortic valve mean gradient measures 3.0 mmHg. Aortic valve peak gradient measures 6.7 mmHg. Aortic valve area, by VTI measures 3.32 cm.  Pulmonic Valve: The pulmonic valve was normal in structure. Pulmonic valve regurgitation is mild. No evidence of pulmonic stenosis.  Aorta: The aortic root is normal in size and structure.  Venous: The inferior vena cava is dilated in size with greater than 50% respiratory variability, suggesting right atrial pressure of 8 mmHg.  IAS/Shunts: No atrial level shunt detected by color flow Doppler.  Additional Comments: 3D was performed not requiring image post processing on an independent workstation and was normal.   LEFT VENTRICLE PLAX 2D LVIDd:         4.38 cm   Diastology LVIDs:         2.90 cm   LV e' medial:    7.37 cm/s LV PW:         0.88 cm   LV E/e' medial:  11.5 LV IVS:        0.82 cm   LV e' lateral:   14.00 cm/s LVOT diam:     2.29 cm   LV E/e' lateral: 6.1 LV SV:         87 LV SV Index:   46        2D Longitudinal Strain LVOT Area:     4.12 cm  2D Strain GLS Avg:     -18.7 %  3D Volume EF: 3D EF:        57 % LV EDV:       161 ml LV ESV:       69 ml LV SV:        92 ml  RIGHT VENTRICLE RV S prime:     11.20 cm/s TAPSE (M-mode): 2.6 cm  LEFT ATRIUM              Index        RIGHT ATRIUM           Index LA diam:        4.88 cm  2.58 cm/m   RA Area:     19.60 cm LA Vol (A2C):   156.0 ml 82.50 ml/m  RA Volume:   53.70 ml  28.40 ml/m LA Vol (A4C):   139.0 ml 73.51 ml/m LA Biplane Vol: 149.0 ml 78.79 ml/m AORTIC VALVE                    PULMONIC VALVE AV Area (  Vmax):    2.98 cm     PV Vmax:       0.98 m/s AV Area (Vmean):   3.21 cm     PV Peak grad:  3.9 mmHg AV Area (VTI):     3.32 cm AV Vmax:           129.00 cm/s AV Vmean:          83.100 cm/s AV VTI:             0.262 m AV Peak Grad:      6.7 mmHg AV Mean Grad:      3.0 mmHg LVOT Vmax:         93.20 cm/s LVOT Vmean:        64.800 cm/s LVOT VTI:          0.211 m LVOT/AV VTI ratio: 0.81  AORTA Ao Root diam: 3.69 cm Ao Asc diam:  3.50 cm Ao Arch diam: 2.9 cm  MITRAL VALVE MV Area (PHT): 3.89 cm    SHUNTS MV Decel Time: 195 msec    Systemic VTI:  0.21 m MV E velocity: 85.00 cm/s  Systemic Diam: 2.29 cm MV A velocity: 60.30 cm/s MV E/A ratio:  1.41  Maude Emmer MD Electronically signed by Maude Emmer MD Signature Date/Time: 10/16/2023/11:52:58 AM    Final   TEE  ECHO TEE 10/24/2023  Narrative TRANSESOPHOGEAL ECHO REPORT    Patient Name:   Kaimana Lurz [facility]. Date of Exam: 10/24/2023 Medical Rec #:  [id number]              Height:       69.0 in Accession #:    [id number]             Weight:       168.2 lb Date of Birth:  17-Sep-1940              BSA:          1.919 m Patient Age:    82 years               BP:           152/68 mmHg Patient Gender: M                      HR:           63 bpm. Exam Location:  Inpatient  Procedure: Transesophageal Echo, 3D Echo, Color Doppler and Cardiac Doppler (Both Spectral and Color Flow Doppler were utilized during procedure).  Indications:     Fever  History:         Patient has prior history of Echocardiogram examinations, most recent 10/16/2023. CAD; Risk Factors:Hypertension.  Sonographer:     Damien Senior RDCS Referring Phys:  [id number] ONEIL JAYSON PARCHMENT Diagnosing Phys: Oneil Parchment MD  PROCEDURE: After discussion of the risks and benefits of a TEE, an informed consent was obtained from the patient. The transesophogeal probe was passed without difficulty through the esophogus of the patient. Sedation performed by different physician. The patient was monitored while under deep sedation. Anesthestetic sedation was provided intravenously by Anesthesiology: 193mg  of Propofol , 80mg  of Lidocaine . The patient's vital signs; including heart rate,  blood pressure, and oxygen saturation; remained stable throughout the procedure. The patient developed no complications during the procedure.  IMPRESSIONS   1. Left ventricular ejection fraction, by estimation, is 55 to 60%. The left ventricle has normal function. The left ventricle has no regional  wall motion abnormalities. 2. Right ventricular systolic function is normal. The right ventricular size is normal. 3. No left atrial/left atrial appendage thrombus was detected. 4. The mitral valve is normal in structure. Mild mitral valve regurgitation. No evidence of mitral stenosis. 5. The aortic valve is tricuspid. Aortic valve regurgitation is not visualized. No aortic stenosis is present. 6. There is Moderate (Grade III) plaque. 7. The inferior vena cava is normal in size with greater than 50% respiratory variability, suggesting right atrial pressure of 3 mmHg. 8. 3D performed of the LAA and demonstrates No thrombus.  Conclusion(s)/Recommendation(s): No evidence of vegetation/infective endocarditis on this transesophageael echocardiogram.  FINDINGS Left Ventricle: Left ventricular ejection fraction, by estimation, is 55 to 60%. The left ventricle has normal function. The left ventricle has no regional wall motion abnormalities. The left ventricular internal cavity size was normal in size. There is no left ventricular hypertrophy.  Right Ventricle: The right ventricular size is normal. No increase in right ventricular wall thickness. Right ventricular systolic function is normal.  Left Atrium: Left atrial size was normal in size. No left atrial/left atrial appendage thrombus was detected.  Right Atrium: Right atrial size was normal in size.  Pericardium: There is no evidence of pericardial effusion.  Mitral Valve: The mitral valve is normal in structure. Mild mitral valve regurgitation. No evidence of mitral valve stenosis.  Tricuspid Valve: The tricuspid valve is normal in structure.  Tricuspid valve regurgitation is trivial. No evidence of tricuspid stenosis.  Aortic Valve: The aortic valve is tricuspid. Aortic valve regurgitation is not visualized. No aortic stenosis is present.  Pulmonic Valve: The pulmonic valve was normal in structure. Pulmonic valve regurgitation is not visualized. No evidence of pulmonic stenosis.  Aorta: The aortic root is normal in size and structure. There is moderate (Grade III) plaque.  Venous: The inferior vena cava is normal in size with greater than 50% respiratory variability, suggesting right atrial pressure of 3 mmHg.  IAS/Shunts: No atrial level shunt detected by color flow Doppler.  Additional Comments: Spectral Doppler performed.  Oneil Parchment MD Electronically signed by Oneil Parchment MD Signature Date/Time: 10/24/2023/12:13:13 PM    Final    CT SCANS  CT CARDIAC SCORING (SELF PAY ONLY) 07/23/2018  Addendum 07/23/2018 10:36 AM ADDENDUM REPORT: 07/23/2018 10:33  CLINICAL DATA:  Risk stratification  EXAM: Coronary Calcium  Score  TECHNIQUE: The patient was scanned on a Siemens Somatom 64 slice scanner. Axial non-contrast 3 mm slices were carried out through the heart. The data set was analyzed on a dedicated work station and scored using the Agatson method.  FINDINGS: Non-cardiac: See separate report from [facility].  Ascending aorta: Normal diameter 3.5 cm  Pericardium: Normal  Coronary arteries: Calcium  noted in mid and distal LAD  IMPRESSION: Coronary calcium  score of 41. This was 15 th percentile for age and sex matched control.  Maude Emmer   Electronically Signed By: Maude Emmer M.D. On: 07/23/2018 10:33  Narrative EXAM: OVER-READ INTERPRETATION  CT CHEST  The following report is an over-read performed by radiologist Dr. Marcey Moan of [facility] on 07/23/2018. This over-read does not include interpretation of cardiac or coronary anatomy or pathology. The coronary  calcium  score interpretation by the cardiologist is attached.  COMPARISON:  None.  FINDINGS: Vascular: Calcified plaque in the descending thoracic aorta without aneurysm.  Mediastinum/Nodes: Visualized mediastinum and hilar regions show no lymphadenopathy.  Lungs/Pleura: Visualized lungs show no evidence of pulmonary edema, consolidation, pneumothorax, nodule or pleural fluid.  Upper Abdomen:  No acute abnormality.  Musculoskeletal: No chest wall mass or suspicious bone lesions identified.  IMPRESSION: No significant incidental findings. Atherosclerosis of the descending thoracic aorta.  Electronically Signed: By: Marcey Moan M.D. On: 07/23/2018 09:46     ______________________________________________________________________________________________      Risk Assessment/Calculations           Physical Exam VS:  BP 132/66 (BP Location: Left Arm, Patient Position: Sitting, Cuff Size: Normal)   Pulse (!) 43   Ht 5' 9 (1.753 m)   Wt 143 lb 9.6 oz (65.1 kg)   SpO2 96%   BMI 21.21 kg/m        Wt Readings from Last 3 Encounters:  06/18/24 143 lb (64.9 kg)  06/17/24 143 lb 9.6 oz (65.1 kg)  05/28/24 153 lb 1 oz (69.4 kg)    GEN: Well nourished, well developed in no acute distress NECK: No JVD; No carotid bruits CARDIAC: RRR, no murmurs, rubs, gallops RESPIRATORY:  Clear to auscultation without rales, wheezing or rhonchi  ABDOMEN: Soft, non-tender, non-distended EXTREMITIES:  No edema; No deformity   ASSESSMENT AND PLAN  Bradycardia: Patient arrived with a heart rate of 43 based on pulse oximeter.  Recent hospital discharge paperwork also suggested he had bradycardia in the hospital.  I will obtain a 2-week heart monitor to reassess.  He does have frequent PACs which may not be captured on home pulse oximeter.  He does not have any symptoms associated with bradycardia.  I will follow-up in 6 weeks to review the heart monitor result  Myelodysplastic syndrome:  Followed by oncology service.       Dispo: Follow-up in 6 weeks  Signed, Jordon Kristiansen, PA

## 2024-06-18 ENCOUNTER — Encounter: Payer: Self-pay | Admitting: Infectious Diseases

## 2024-06-18 ENCOUNTER — Other Ambulatory Visit: Payer: Self-pay

## 2024-06-18 ENCOUNTER — Ambulatory Visit: Admitting: Infectious Diseases

## 2024-06-18 VITALS — BP 155/80 | HR 77 | Temp 97.9°F | Ht 69.0 in | Wt 143.0 lb

## 2024-06-18 DIAGNOSIS — Z79899 Other long term (current) drug therapy: Secondary | ICD-10-CM | POA: Diagnosis not present

## 2024-06-18 DIAGNOSIS — L03116 Cellulitis of left lower limb: Secondary | ICD-10-CM | POA: Diagnosis not present

## 2024-06-18 DIAGNOSIS — A4901 Methicillin susceptible Staphylococcus aureus infection, unspecified site: Secondary | ICD-10-CM | POA: Diagnosis not present

## 2024-06-18 MED ORDER — CEFADROXIL 500 MG PO CAPS: 1000.0000 mg | ORAL_CAPSULE | Freq: Two times a day (BID) | ORAL | 0 refills | Status: AC

## 2024-06-18 NOTE — Progress Notes (Addendum)
 Patient Active Problem List   Diagnosis Date Noted   Rib pain on right side 05/26/2024   Cannot walk 05/26/2024   Left leg cellulitis 05/26/2024   Effusion of bursa of elbow, left 04/04/2024   Bursitis of left elbow 02/13/2024   Cellulitis and abscess of left leg 02/08/2024   Cellulitis of left leg 02/07/2024   Intertrigo 12/16/2023   MSSA (methicillin susceptible Staphylococcus aureus) infection 12/11/2023   Swelling of lower leg 11/17/2023   PICC (peripherally inserted central catheter) in place 11/17/2023   Medication management 11/17/2023   Erysipelas of both lower extremities 11/12/2023   Urinary incontinence 11/12/2023   Septic bursitis of elbow 11/06/2023   Abscess of epidural space of spine due to bacteria 10/25/2023   Epidural abscess 10/25/2023   MSSA bacteremia 10/22/2023   Hypokalemia 10/21/2023   Acute on chronic pain of left elbow concerning for septic arthritis 10/21/2023   Spinal stenosis of lumbar region 10/16/2023   Pseudophakia of both eyes 02/15/2022   MDS (myelodysplastic syndrome) (HCC) 12/25/2021   Abnormal EKG 11/18/2021   Thrombocytopenia 11/18/2021   Lumbar spondylosis 07/22/2021   Lumbar radiculopathy 03/24/2021   S/P total knee arthroplasty, right 11/09/2020   Weight loss 08/30/2020   Complication associated with orthopedic device 06/10/2020   Diastolic dysfunction 10/27/2019   Exudative age-related macular degeneration of left eye with active choroidal neovascularization (HCC) 10/15/2019   Intermediate stage nonexudative age-related macular degeneration of left eye 10/15/2019   Early stage nonexudative age-related macular degeneration of right eye 10/15/2019   Pseudophakia 10/15/2019   Posterior vitreous detachment of left eye 10/15/2019   GERD (gastroesophageal reflux disease) 11/13/2018   Coronary artery disease 10/24/2018   Claudication 02/22/2018   Anemia 08/28/2017   DOE (dyspnea on exertion) 07/31/2017   Fatigue 07/31/2017    Essential hypertension 11/29/2016   Smoking greater than 30 pack years 02/02/2016   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) 02/28/2013   Ingrowing toenail of left foot 08/29/2012   Allergic rhinitis 10/10/2011   Dupuytren's contracture of hand 07/05/2011   Vitamin B12 deficiency 12/23/2010   BPH (benign prostatic hyperplasia) 12/23/2010   Low back pain 07/27/2009   TOBACCO USE, QUIT 07/27/2009   Diverticulosis of colon 03/28/2009   RLS (restless legs syndrome) 10/28/2008   SKIN RASH, ALLERGIC 10/01/2008   Edema 10/01/2008   Neuropathy 04/22/2008   Vitamin D  deficiency 01/21/2008   INSOMNIA, CHRONIC 12/23/2007   Osteoarthritis 12/23/2007   CAROTID BRUIT, LEFT 12/23/2007   Current Outpatient Medications on File Prior to Visit  Medication Sig Dispense Refill   acetaminophen  (TYLENOL ) 500 MG tablet Take 1,000 mg by mouth every 6 (six) hours as needed for mild pain (pain score 1-3).     amLODipine  (NORVASC ) 5 MG tablet TAKE 1 TABLET BY MOUTH DAILY (Patient taking differently: Take 5 mg by mouth at bedtime.) 90 tablet 3   artificial tears ophthalmic solution Place 1 drop into both eyes as needed for dry eyes (At night).     calcium  carbonate (TUMS EX) 750 MG chewable tablet Chew 1 tablet by mouth 2 (two) times daily as needed for heartburn.     carbidopa -levodopa  (SINEMET  IR) 25-100 MG tablet For breakthrough restless leg, you can take 1 tablet at bedtime.  OK to take extra tab as needed during the day. (Patient taking differently: Take 1 tablet by mouth at bedtime.) 100 tablet 3   Cholecalciferol  (VITAMIN D3) 1000 units CAPS Take 1,000 Units by mouth in the morning and at bedtime.  Cyanocobalamin  (VITAMIN B-12) 1000 MCG SUBL DISSOLVE 2 TABLETS IN MOUTH EVERY DAY (Patient taking differently: Place 2,000 mcg under the tongue daily.) 180 tablet 1   finasteride  (PROSCAR ) 5 MG tablet TAKE 1 TABLET (5 MG TOTAL) BY MOUTH DAILY. 90 tablet 1   furosemide  (LASIX ) 20 MG tablet Take 1 tablet (20 mg total) by  mouth daily. (Patient taking differently: Take 20 mg by mouth daily as needed.) 30 tablet 0   gabapentin  (NEURONTIN ) 300 MG capsule Take 1 capsule (300 mg total) by mouth 3 (three) times daily. 270 capsule 2   ibuprofen (ADVIL) 200 MG tablet Take 400 mg by mouth every 8 (eight) hours as needed for mild pain (pain score 1-3) (if not taking Tylenol ).     magnesium  hydroxide (MILK OF MAGNESIA) 400 MG/5ML suspension Take 15 mLs by mouth daily as needed for mild constipation.     Multiple Vitamins-Minerals (PRESERVISION AREDS PO) Take 1 capsule by mouth 2 (two) times daily.     mupirocin  ointment (BACTROBAN ) 2 % Apply 1 Application topically 2 (two) times daily. (Patient taking differently: Apply 1 Application topically See admin instructions. Apply to affected toe on the right foot once a day) 30 g 2   ondansetron  (ZOFRAN ) 4 MG tablet Take 1 tablet (4 mg total) by mouth every 6 (six) hours as needed for nausea or vomiting. 30 tablet 0   Oxycodone  HCl 10 MG TABS Take 1 tablet (10 mg total) by mouth every 4 (four) hours as needed (pain).     Polyethyl Glycol-Propyl Glycol (SYSTANE OP) Place 1 drop into both eyes 2 (two) times daily as needed (for dryness).     pravastatin  (PRAVACHOL ) 20 MG tablet TAKE 1 TABLET (20 MG TOTAL) BY MOUTH DAILY. 90 tablet 1   rOPINIRole  (REQUIP ) 2 MG tablet TAKE 1 TABLET BY MOUTH AT AT NOON AND 1 TAB AT 4PM AND 1 TAB AT BEDTIME (Patient taking differently: Take 2 mg by mouth 2 (two) times daily as needed (for restless legs).) 270 tablet 2   tamsulosin  (FLOMAX ) 0.4 MG CAPS capsule TAKE 1 CAPSULE (0.4 MG TOTAL) BY MOUTH IN THE MORNING. 90 capsule 1   terazosin  (HYTRIN ) 2 MG capsule TAKE 1 CAPSULE BY MOUTH EVERY NIGHT AT BEDTIME 90 capsule 1   No current facility-administered medications on file prior to visit.   Subjective: Discussed the use of AI scribe software for clinical note transcription with the patient, who gave verbal consent to proceed.   83 year old male with prior  history of HTN, CAD, MDS with thrombocytopenia, depression BCC, BPH, melanoma, GERD, DDD, OA, TIA, h/o Tobacco/alcohol  abuse who is here for HFU 4/20-4/25 for left elbow septic bursitis and lumbar epidural abscess.  Patient had an open fracture of his left elbow in 2007. Approximately 4 years ago one of the pins came loose and it was taken out and he had limited range of motion in his left elbow with inability to fully flex and extend and unable to function with his left hand. Left Elbow x-ray as below. 4/20 left elbow SF Gram stain with GPC. Cx with MSSA.  4/20 blood culture with MSSA.  4/22 underwent I&D with removal of hardware, 1 screw remaining ( no OP note available). OR cx with MSSA and MSSE. TTE and TEE negative for vegetations or endocarditis.  MRI L spine septic arthritis of left L5-S1.  Left dorsal epidural abscess extending from L4-S1, phlegmonous change in the left psoas.  No surgical intervention per neurosurgery.  Also  required PRBC transfusion for anemia. Discharged on 4/25 to complete 8 weeks course of IV cefazolin  through 6/17  5/15 Patient is accompanied by his daughter surgery.  Getting IV antibiotics through PICC without any concerns related to antibiotics or PICC.  Cefazolin  was recently reduced to 1 g every 12 dosing due to elevated kidney function.  Daughter reports increased swelling and redness in his left leg compared to right leg for the last few days history of chronic intermittent swelling of bilateral legs.  Seen by PCP on Monday and was started on a 7-day course of doxycycline .  Patient is also on diuretic. Saw Dr. Celena yesterday and discharged from his care as left elbow has healed. Also saw Dr Mavis post discharge and, wants to repeat MRI end of month to see if improvement and needs for +/- surgery.  6/10 Accompanied by daughter. Getting IV cefazolin  through PICC without any concerns. Left elbow wound has healed. He has experienced significant improvement in mobility since  the last visit three weeks ago. He is now able to walk to the bathroom and bed with the aid of a walker and can walk to the car instead of using a wheelchair. However, he continues to use a wheelchair for longer distances due to balance issues.  He experiences significant back pain, particularly when lying down, rating it close to ten on a scale of one to ten. The pain is alleviated when sitting. He is under pain management with oxycodone , which he takes as needed, usually less than the prescribed five times a day. He is scheduled for an MRI 6/11.  Next fu with Dr Mavis on July 1. Has one more fu with Dr Celena. Seen by PCP on 6/3, leg swelling has improved since last visit and has stopped lasix  recently due to need to frequently urinate at night.   8/4 He was seen by Dr Overton last couple of times. 6/23 visit, cefadroxil  changed to PO linezolid  for 7 days due to concerns for infection in left elbow then switched back to PO cefadroxil  which he has been taking till date without concerns.   Follows pain specialist Dr Bonner for back pain, back pain is 2-3/10 at baseline and 7/10 when he walks around, lies at back. Was prescribed prednisone  taper last Friday from pain clinic and has 2 more days left, can't tell if made a difference. Discharged by Neurosurgery.   Left elbow with no concerns, and Dr Celena had no concerns as well per daughter.   9/12 Taking p.o. cefadroxil  2 tablets twice daily without missing doses or concerns.  Reports being seen by Dr. Celena 3 weeks ago and got aspiration of left elbow done which was bloody and no growth in cultures.  No concerns in left elbow although does not have full mobility of left elbow, which is chronic per daughter.  Last seen by PCP on 9/9. No complaints otherwise.   10/3 Accompanied by daughter. Not taking antibiotics. No concerns in left elbow except same old swelling at the left elbow likely bursa but no erythema, warmth or tenderness. No fevers and chills.  No concerns in legs. No other complaints.   06/18/24 Recently admitted 11/24-11/30 for left leg cellulitis/wound.  Continued on IV cefazolin  in the hospital and discharged on p.o. linezolid /cefadroxil  for 7 more days then discharged after clinical improvement. 11/27 wound cx with MSSA.   Accompanied by daughter.  Took p.o. linezolid  and cefadroxil  as instructed with no missed doses or concerns.  Prior left leg wound  has healed.  Discussed with daughter to make a follow-up with podiatry for cutting his toenails.  No symptoms or signs of tinea pedis. Cefadroxil  1gpo bid as standby   Review of Systems: All systems reviewed with pertinent positive and negative as listed above.   Past Medical History:  Diagnosis Date   Alcoholism (HCC)    Dry 13 years   Basal cell carcinoma    Right Chest   BPH (benign prostatic hyperplasia)    Diverticulosis of colon    Dizzy spells    none recent   DJD (degenerative joint disease)    lower back   Elbow fracture, left 2009   Dr Celena   FRACTURE, [place], LEFT 12/23/2007   Qualifier: Diagnosis of   By: Georgian ROSALEA CHARM Lamar        GERD (gastroesophageal reflux disease)    Glaucoma    History of TIAs 1 yrs ago   Hyperkeratosis    LBP (low back pain)    Macular degeneration    left wet right dry sees dr elner   Melanoma [facility]) 2009   x3 Dr Amy Jordan   Osteoarthritis    Restless leg syndrome    Thrombocytopenia 11/18/2021   Tinnitus    Tobacco abuse    Uses walker    Wears glasses    reading   Wears partial dentures    upper    Past Surgical History:  Procedure Laterality Date   CATARACT EXTRACTION Bilateral 2018   HARDWARE REMOVAL Left 07/09/2020   Procedure: Left elbow hardware removal;  Surgeon: Sharl Selinda Dover, MD;  Location: [facility];  Service: Orthopedics;  Laterality: Left;  60 mins   HARDWARE REMOVAL Left 10/23/2023   Procedure: REMOVAL, HARDWARE;  Surgeon: Celena Sharper, MD;  Location: [facility] OR;  Service: Orthopedics;   Laterality: Left;   IRRIGATION AND DEBRIDEMENT ELBOW Left 10/23/2023   Procedure: IRRIGATION AND DEBRIDEMENT ELBOW;  Surgeon: Celena Sharper, MD;  Location: [facility] OR;  Service: Orthopedics;  Laterality: Left;   mda     Dr. Elner wet injection   MELANOMA EXCISION  2009   x3   RECONSTRUCTION MEDIAL COLLATERAL LIGAMENT ELBOW W/ TENDON GRAFT  2009   Left, Dr Celena   TONSILLECTOMY  as child   TOTAL KNEE ARTHROPLASTY Right 11/09/2020   Procedure: TOTAL KNEE ARTHROPLASTY;  Surgeon: Ernie Cough, MD;  Location: [facility] ORS;  Service: Orthopedics;  Laterality: Right;   TRANSESOPHAGEAL ECHOCARDIOGRAM (CATH LAB) N/A 10/24/2023   Procedure: TRANSESOPHAGEAL ECHOCARDIOGRAM;  Surgeon: Jeffrie Oneil BROCKS, MD;  Location: [facility] INVASIVE CV LAB;  Service: Cardiovascular;  Laterality: N/A;    Social History   Tobacco Use   Smoking status: Former    Current packs/day: 0.00    Average packs/day: 3.0 packs/day for 38.0 years (114.0 ttl pk-yrs)    Types: Cigarettes    Start date: 16    Quit date: 47    Years since quitting: 39.9   Smokeless tobacco: Never   Tobacco comments:    states he quit May 15 1985  Vaping Use   Vaping status: Never Used  Substance Use Topics   Alcohol  use: No    Comment: PREVIOUS - DRY 16yrs   Drug use: Not Currently    Family History  Problem Relation Age of Onset   Cancer Mother        ?breast   Cancer Father        Lung    Allergies  Allergen Reactions   Tape  Other (See Comments)    SKIN IS VERY THIN AND WILL TEAR AND BRUISE EASILY!!! Paper tape is ok.   Levofloxacin Other (See Comments)    Hands peeled    Health Maintenance  Topic Date Due   COVID-19 Vaccine (4 - 2025-26 season) 03/03/2024   Medicare Annual Wellness (AWV)  07/19/2024   DTaP/Tdap/Td (3 - Td or Tdap) 05/24/2032   Pneumococcal Vaccine: 50+ Years  Completed   Influenza Vaccine  Completed   Meningococcal B Vaccine  Aged Out   Zoster Vaccines- Shingrix  Discontinued   Objective: BP (!) 155/80    Pulse 77   Temp 97.9 F (36.6 C) (Oral)   Ht 5' 9 (1.753 m)   Wt 143 lb (64.9 kg)   SpO2 97%   BMI 21.12 kg/m    Physical Exam Constitutional:      Appearance: Normal appearance.  HENT:     Head: Normocephalic and atraumatic.      Mouth: Mucous membranes are moist.  Eyes:    Conjunctiva/sclera: Conjunctivae normal.     Pupils: Pupils are equal, round, and b/l symmetrical    Cardiovascular:     Rate and Rhythm: Normal rate     Heart sounds:  Pulmonary:     Effort: Pulmonary effort is normal.     Breath sounds:  Abdominal:     General: Non distended     Palpations:   Musculoskeletal:        General: ambulatory.   Left leg with healed wound, some discoloration with venous hyperpigmentation but no erythema, redness or warmth or fluctuance.  No signs of infection.  Long toenails.  No signs of tinea pedis.     Skin:    General: Skin is warm and dry.     Comments:   Neurological:     General: grossly non focal     Mental Status: awake, alert and oriented to person, place, and time.   Psychiatric:        Mood and Affect: Mood normal.   Lab Results Lab Results  Component Value Date   WBC 3.1 (L) 06/12/2024   HGB 9.8 (L) 06/12/2024   HCT 31.3 (L) 06/12/2024   MCV 73.3 (L) 06/12/2024   PLT 77 (L) 06/12/2024    Lab Results  Component Value Date   CREATININE 1.33 (H) 06/12/2024   BUN 19 06/12/2024   NA 137 06/12/2024   K 4.0 06/12/2024   CL 101 06/12/2024   CO2 23 06/12/2024    Lab Results  Component Value Date   ALT 7 06/12/2024   AST 24 06/12/2024   ALKPHOS 79 06/12/2024   BILITOT 0.7 06/12/2024    Lab Results  Component Value Date   CHOL 136 01/12/2017   HDL 54.70 01/12/2017   LDLCALC 68 01/12/2017   TRIG 67.0 01/12/2017   CHOLHDL 2 01/12/2017   No results found for: LABRPR, RPRTITER No results found for: HIV1RNAQUANT, HIV1RNAVL, CD4TABS   Microbiology Results for orders placed or performed during the hospital encounter of  05/26/24  Culture, blood (routine x 2)     Status: None   Collection Time: 05/26/24  4:30 PM   Specimen: BLOOD RIGHT ARM  Result Value Ref Range Status   Specimen Description   Final    BLOOD RIGHT ARM Performed at [facility], [address] [place], [place] [address]    Special Requests   Final    BOTTLES DRAWN AEROBIC AND ANAEROBIC Blood Culture adequate volume Performed  at [facility], [address], [place], [place] [address]    Culture   Final    NO GROWTH 5 DAYS Performed at [facility], [address] [place], [place] [address]    Report Status 05/31/2024 FINAL  Final  Culture, blood (routine x 2)     Status: None   Collection Time: 05/26/24  5:55 PM   Specimen: BLOOD RIGHT HAND  Result Value Ref Range Status   Specimen Description   Final    BLOOD RIGHT HAND Performed at [facility], [address] [place], [place] [address]    Special Requests   Final    BOTTLES DRAWN AEROBIC AND ANAEROBIC Blood Culture adequate volume Performed at [facility], [address] [place], [place] [address]    Culture   Final    NO GROWTH 5 DAYS Performed at [facility], [address] [place], [place] [address]    Report Status 05/31/2024 FINAL  Final  Body fluid culture w Gram Stain     Status: None   Collection Time: 05/29/24  2:15 PM   Specimen: Wound; Body Fluid  Result Value Ref Range Status   Specimen Description WOUND  Final   Special Requests IN SWAB  Final   Gram Stain   Final    FEW WBC PRESENT, PREDOMINANTLY PMN FEW GRAM POSITIVE COCCI Performed at [facility], [address], [place], [place] [address]    Culture FEW STAPHYLOCOCCUS AUREUS  Final   Report Status 06/01/2024 FINAL  Final   Organism ID, Bacteria STAPHYLOCOCCUS AUREUS  Final      Susceptibility   Staphylococcus aureus - MIC*    CIPROFLOXACIN >=8 RESISTANT Resistant     ERYTHROMYCIN >=8 RESISTANT Resistant     GENTAMICIN  <=0.5 SENSITIVE Sensitive     OXACILLIN 2 SENSITIVE Sensitive     TETRACYCLINE >=16 RESISTANT Resistant     VANCOMYCIN  1 SENSITIVE Sensitive     TRIMETH /SULFA  >=320 RESISTANT Resistant     CLINDAMYCIN  <=0.25 SENSITIVE Sensitive     RIFAMPIN <=0.5 SENSITIVE Sensitive     Inducible Clindamycin  NEGATIVE Sensitive     LINEZOLID  2 SENSITIVE Sensitive     * FEW STAPHYLOCOCCUS AUREUS   Imaging        Microbiology Results for orders placed or performed during the hospital encounter of 05/26/24  Culture, blood (routine x 2)     Status: None   Collection Time: 05/26/24  4:30 PM   Specimen: BLOOD RIGHT ARM  Result Value Ref Range Status   Specimen Description   Final    BLOOD RIGHT ARM Performed at [facility], [address], [place], [place] [address]    Special Requests   Final    BOTTLES DRAWN AEROBIC AND ANAEROBIC Blood Culture adequate volume Performed at [facility], [address], [place], [place] [address]    Culture   Final    NO GROWTH 5 DAYS Performed at [facility], [address] [place], [place] [address]    Report Status 05/31/2024 FINAL  Final  Culture, blood (routine x 2)     Status: None   Collection Time: 05/26/24  5:55 PM   Specimen: BLOOD RIGHT HAND  Result Value Ref Range Status   Specimen Description   Final    BLOOD RIGHT HAND Performed at [facility], [address] [place], [place] [address]  Special Requests   Final    BOTTLES DRAWN AEROBIC AND ANAEROBIC Blood Culture adequate volume Performed at [facility], [address], [place], [place] [address]    Culture   Final    NO GROWTH 5 DAYS Performed at [facility], [address], [place], [place] [address]    Report Status 05/31/2024 FINAL  Final  Body fluid culture w Gram Stain     Status: None   Collection Time: 05/29/24  2:15 PM   Specimen: Wound; Body Fluid  Result Value Ref Range Status   Specimen Description WOUND   Final   Special Requests IN SWAB  Final   Gram Stain   Final    FEW WBC PRESENT, PREDOMINANTLY PMN FEW GRAM POSITIVE COCCI Performed at [facility], [address], [place], [place] [address]    Culture FEW STAPHYLOCOCCUS AUREUS  Final   Report Status 06/01/2024 FINAL  Final   Organism ID, Bacteria STAPHYLOCOCCUS AUREUS  Final      Susceptibility   Staphylococcus aureus - MIC*    CIPROFLOXACIN >=8 RESISTANT Resistant     ERYTHROMYCIN >=8 RESISTANT Resistant     GENTAMICIN <=0.5 SENSITIVE Sensitive     OXACILLIN 2 SENSITIVE Sensitive     TETRACYCLINE >=16 RESISTANT Resistant     VANCOMYCIN  1 SENSITIVE Sensitive     TRIMETH /SULFA  >=320 RESISTANT Resistant     CLINDAMYCIN  <=0.25 SENSITIVE Sensitive     RIFAMPIN <=0.5 SENSITIVE Sensitive     Inducible Clindamycin  NEGATIVE Sensitive     LINEZOLID  2 SENSITIVE Sensitive     * FEW STAPHYLOCOCCUS AUREUS    Imaging  VAS US  LOWER EXTREMITY VENOUS (DVT) Result Date: 05/30/2024  Lower Venous DVT Study Patient Name:  Vikash Sibert [facility].  Date of Exam:   05/30/2024 Medical Rec #: [id number]               Accession #:    [id number] Date of Birth: 02-09-1941               Patient Gender: M Patient Age:   83 years Exam Location:  [facility] Procedure:      VAS US  LOWER EXTREMITY VENOUS (DVT) Referring Phys: KELLY GRIFFITH --------------------------------------------------------------------------------  Indications: Pain, Swelling, Erythema, and Recurrent cellulitis.  Risk Factors: Myelodysplastic syndrome. Comparison Study: Prior negative left LEV done 05/26/24 Performing Technologist: Alberta Lis RVS  Examination Guidelines: A complete evaluation includes B-mode imaging, spectral Doppler, color Doppler, and power Doppler as needed of all accessible portions of each vessel. Bilateral testing is considered an integral part of a complete examination. Limited examinations for reoccurring indications may be performed as noted. The reflux  portion of the exam is performed with the patient in reverse Trendelenburg.  +-----+---------------+---------+-----------+----------+--------------+ RIGHTCompressibilityPhasicitySpontaneityPropertiesThrombus Aging +-----+---------------+---------+-----------+----------+--------------+ CFV  Full           Yes      Yes                                 +-----+---------------+---------+-----------+----------+--------------+ SFJ  Full                                                        +-----+---------------+---------+-----------+----------+--------------+   +---------+---------------+---------+-----------+----------+--------------+ LEFT     CompressibilityPhasicitySpontaneityPropertiesThrombus  Aging +---------+---------------+---------+-----------+----------+--------------+ CFV      Full           Yes      Yes                                 +---------+---------------+---------+-----------+----------+--------------+ SFJ      Full                                                        +---------+---------------+---------+-----------+----------+--------------+ FV Prox  Full           Yes      Yes                                 +---------+---------------+---------+-----------+----------+--------------+ FV Mid   Full           Yes      Yes                                 +---------+---------------+---------+-----------+----------+--------------+ FV DistalFull           Yes      Yes                                 +---------+---------------+---------+-----------+----------+--------------+ PFV      Full           Yes      No                                  +---------+---------------+---------+-----------+----------+--------------+ POP      Full           Yes      Yes                                 +---------+---------------+---------+-----------+----------+--------------+ PTV      Full                                                         +---------+---------------+---------+-----------+----------+--------------+ PERO     Full                                                        +---------+---------------+---------+-----------+----------+--------------+ Gastroc  Full                                                        +---------+---------------+---------+-----------+----------+--------------+ SSV      Full  thickened                +---------+---------------+---------+-----------+----------+--------------+     Summary: RIGHT: - No evidence of common femoral vein obstruction.  Interstitial edema noted  LEFT: - There is no evidence of deep vein thrombosis in the lower extremity.  - No cystic structure found in the popliteal fossa. - Subcutaneous edema noted throughout the calf - Ultrasound characteristics of enlarged lymph nodes noted in the groin. Interstitial edema noted.  *See table(s) above for measurements and observations. Electronically signed by Debby Robertson on 05/30/2024 at 4:23:40 PM.    Final    VAS US  ABI WITH/WO TBI Result Date: 05/30/2024  LOWER EXTREMITY DOPPLER STUDY Patient Name:  MARKEL KURTENBACH Schram JR.  Date of Exam:   05/30/2024 Medical Rec #: [id number]               Accession #:    [id number] Date of Birth: August 02, 1940               Patient Gender: M Patient Age:   70 years Exam Location:  [facility] Procedure:      VAS US  ABI WITH/WO TBI Referring Phys: KELLY GRIFFITH --------------------------------------------------------------------------------  Indications: Recurrent cellulitis High Risk Factors: Hypertension, past history of smoking, coronary artery                    disease. Other Factors: Myelodysplastic syndrome.  Limitations: Today's exam was limited due to Edema of left foot. Comparison Study: Prior ABI done 02/28/2018 Performing Technologist: Rachel Pellet RVS  Examination Guidelines: A complete evaluation includes at minimum, Doppler waveform signals  and systolic blood pressure reading at the level of bilateral brachial, anterior tibial, and posterior tibial arteries, when vessel segments are accessible. Bilateral testing is considered an integral part of a complete examination. Photoelectric Plethysmograph (PPG) waveforms and toe systolic pressure readings are included as required and additional duplex testing as needed. Limited examinations for reoccurring indications may be performed as noted.  ABI Findings: +---------+------------------+-----+-----------+--------+ Right    Rt Pressure (mmHg)IndexWaveform   Comment  +---------+------------------+-----+-----------+--------+ Brachial 150                    triphasic           +---------+------------------+-----+-----------+--------+ PTA      177               1.18 multiphasic         +---------+------------------+-----+-----------+--------+ DP       164               1.09 triphasic           +---------+------------------+-----+-----------+--------+ Great Toe155               1.03 Normal              +---------+------------------+-----+-----------+--------+ +---------+------------------+-----+---------+-------+ Left     Lt Pressure (mmHg)IndexWaveform Comment +---------+------------------+-----+---------+-------+ Brachial 144                    triphasic        +---------+------------------+-----+---------+-------+ PTA      175               1.17 triphasic        +---------+------------------+-----+---------+-------+ DP       166               1.11 triphasic        +---------+------------------+-----+---------+-------+ Burnetta Reedy  0.89 Normal           +---------+------------------+-----+---------+-------+ +-------+-----------+-----------+------------+------------+ ABI/TBIToday's ABIToday's TBIPrevious ABIPrevious TBI +-------+-----------+-----------+------------+------------+ Right  1.18       1.03       1.14        0.77          +-------+-----------+-----------+------------+------------+ Left   1.17       0.89       1.17        0.88         +-------+-----------+-----------+------------+------------+ Bilateral ABIs and TBIs appear essentially unchanged compared to prior study on 02/28/2018.  Summary: Right: Resting right ankle-brachial index is within normal range. The right toe-brachial index is normal.  Left: Resting left ankle-brachial index is within normal range. The left toe-brachial index is normal.  *See table(s) above for measurements and observations.  Electronically signed by Debby Robertson on 05/30/2024 at 4:23:23 PM.    Final    CT TIBIA FIBULA LEFT W CONTRAST Result Date: 05/27/2024 EXAM: CT LEFT LOWER EXTREMITY, WITH IV CONTRAST 05/27/2024 11:24:59 AM TECHNIQUE: Axial images were acquired through the left lower extremity with IV contrast. Reformatted images were reviewed. Automated exposure control, iterative reconstruction, and/or weight based adjustment of the mA/kV was utilized to reduce the radiation dose to as low as reasonably achievable. COMPARISON: Radiographs 05/26/2024. CLINICAL HISTORY: Soft tissue infection suspected, lower leg. FINDINGS: BONES AND JOINTS: No acute fracture or focal osseous lesion. No bony destructive findings characteristic of osteomyelitis. No dislocation. The joint spaces are normal. SOFT TISSUES: Circumferential subcutaneous edema in the calf especially distally and extending to overlying the medial and lateral malleoli. Edema tracks along the superficial fascial margin of the medial head left gastrocnemius but no drainable fluid collection is identified. Suspected cutaneous blistering medially along the distal calf about 8.5 cm proximal to the tibiotalar joint. Mild cutaneous irregularity just above this level medially as on image 264 series 10. IMPRESSION: 1. Circumferential subcutaneous edema in the left calf, especially distally, with suspected medial distal calf cutaneous  blistering; no drainable fluid collection identified. Electronically signed by: Ryan Salvage MD 05/27/2024 11:39 AM EST RP Workstation: [id number]   VAS US  LOWER EXTREMITY VENOUS (DVT) (ONLY [facility] & [facility]) Result Date: 05/26/2024  Lower Venous DVT Study Patient Name:  Jashawn Floyd [facility]  Date of Exam:   05/26/2024 Medical Rec #: [id number]               Accession #:    [id number] Date of Birth: December 30, 1940               Patient Gender: M Patient Age:   30 years Exam Location:  [facility] Procedure:      VAS US  LOWER EXTREMITY VENOUS (DVT) Referring Phys: ALEXIS YOUNG --------------------------------------------------------------------------------  Indications: Pain, Swelling, and Erythema. Other Indications: Cellulitis. Risk Factors: Myelodysplastic syndrome. Limitations: Poor ultrasound/tissue interface. Comparison Study: Previous exam on 11/15/2023 was negative for DVT Performing Technologist: Alberta Lis RVS  Examination Guidelines: A complete evaluation includes B-mode imaging, spectral Doppler, color Doppler, and power Doppler as needed of all accessible portions of each vessel. Bilateral testing is considered an integral part of a complete examination. Limited examinations for reoccurring indications may be performed as noted. The reflux portion of the exam is performed with the patient in reverse Trendelenburg.  +-----+---------------+---------+-----------+----------+--------------+ RIGHTCompressibilityPhasicitySpontaneityPropertiesThrombus Aging +-----+---------------+---------+-----------+----------+--------------+ CFV  Full           Yes      Yes                                 +-----+---------------+---------+-----------+----------+--------------+   +---------+---------------+---------+-----------+----------+-------------------+  LEFT     CompressibilityPhasicitySpontaneityPropertiesThrombus Aging       +---------+---------------+---------+-----------+----------+-------------------+ CFV      Full           Yes      Yes                                      +---------+---------------+---------+-----------+----------+-------------------+ SFJ      Full                                                             +---------+---------------+---------+-----------+----------+-------------------+ FV Prox  Full                                                             +---------+---------------+---------+-----------+----------+-------------------+ FV Mid   Full                                                             +---------+---------------+---------+-----------+----------+-------------------+ FV DistalFull                                                             +---------+---------------+---------+-----------+----------+-------------------+ PFV      Full                                                             +---------+---------------+---------+-----------+----------+-------------------+ POP      Full           Yes      Yes                                      +---------+---------------+---------+-----------+----------+-------------------+ PTV      Full                                                             +---------+---------------+---------+-----------+----------+-------------------+ PERO                                                  Not well visualized +---------+---------------+---------+-----------+----------+-------------------+     Summary: RIGHT: - No evidence of  common femoral vein obstruction.   LEFT: - There is no evidence of deep vein thrombosis in the lower extremity.  - No cystic structure found in the popliteal fossa. Subcutaneous in area of calf.  *See table(s) above for measurements and observations. Electronically signed by Lonni Gaskins MD on 05/26/2024 at 5:00:15 PM.    Final    CT Angio Chest PE W and/or Wo  Contrast Result Date: 05/26/2024 CLINICAL DATA:  Left leg swelling and pain, myelodysplastic syndrome, right rib pain EXAM: CT ANGIOGRAPHY CHEST WITH CONTRAST TECHNIQUE: Multidetector CT imaging of the chest was performed using the standard protocol during bolus administration of intravenous contrast. Multiplanar CT image reconstructions and MIPs were obtained to evaluate the vascular anatomy. RADIATION DOSE REDUCTION: This exam was performed according to the departmental dose-optimization program which includes automated exposure control, adjustment of the mA and/or kV according to patient size and/or use of iterative reconstruction technique. CONTRAST:  75mL OMNIPAQUE  IOHEXOL  350 MG/ML SOLN COMPARISON:  10/28/2022, 07/23/2018 FINDINGS: Cardiovascular: This is a technically adequate evaluation of the pulmonary vasculature. No filling defects or pulmonary emboli. The heart is unremarkable without pericardial effusion. No evidence of thoracic aortic aneurysm or dissection. Atherosclerosis of the aorta and coronary vasculature. Mediastinum/Nodes: No enlarged mediastinal, hilar, or axillary lymph nodes. Thyroid  gland, trachea, and esophagus demonstrate no significant findings. Lungs/Pleura: No acute airspace disease, effusion, or pneumothorax. Central airways are patent. Upper Abdomen: No acute abnormality. Musculoskeletal: Chronic healing left posterior ninth through eleventh rib fractures. No acute or destructive bony abnormalities. Reconstructed images demonstrate no additional findings. Review of the MIP images confirms the above findings. IMPRESSION: 1. No evidence of pulmonary embolus. 2. No acute intrathoracic process. 3. Aortic Atherosclerosis (ICD10-I70.0). Coronary artery atherosclerosis. Electronically Signed   By: Ozell Daring M.D.   On: 05/26/2024 15:03   DG Tibia/Fibula Left Result Date: 05/26/2024 CLINICAL DATA:  Cellulitis. EXAM: LEFT TIBIA AND FIBULA - 2 VIEW COMPARISON:  None Available.  FINDINGS: No acute fracture or dislocation. The bones are mildly osteopenic. There is diffuse subcutaneous edema which may represent cellulitis. No opaque foreign object or soft tissue gas. IMPRESSION: 1. No acute fracture or dislocation. 2. Diffuse subcutaneous edema. Electronically Signed   By: Vanetta Chou M.D.   On: 05/26/2024 13:39    Assessment/Plan # Left leg cellulitis - improved with IV and PI antibiotics - discussed to see podiatry to cut toe nails - I have prescribed hin cefadroxil  1g po bid for 10 days as a standby to take if any symptoms or signs concerning for cellulitis and to notify us  - fu as needed   # Complicated MSSA bacteremia # Left elbow septic  bursitis # Lumbar epidural abscess  - resolved - see prior note   # MDS w thrombocytopenia/leukopenia  - on retacrit  every 2 weeks  - Follows Oncology, last seen 04/17/24  I spent 20 minutes involved in face-to-face and non-face-to-face activities for this patient on the day of the visit. Professional time spent includes the following activities: Preparing to see the patient (review of tests), Obtaining and reviewing separately obtained history (PCP note 12/8, podiatry note 10/27), Performing a medically appropriate examination and evaluation, Documenting clinical information in the EMR, Independently interpreting results (not separately reported), Communicating results to the patient/daughter, Counseling and educating the patient/daughter and Care coordination (not separately reported).   Of note, portions of this note may have been created with voice recognition software. While this note has been edited for accuracy, occasional wrong-word or sound-a-like substitutions may have  occurred due to the inherent limitations of voice recognition software.   Annalee Joseph, MD [facility] 06/18/2024, 8:58 AM

## 2024-06-25 ENCOUNTER — Inpatient Hospital Stay

## 2024-06-25 VITALS — BP 149/84 | HR 59 | Resp 17

## 2024-06-25 DIAGNOSIS — D469 Myelodysplastic syndrome, unspecified: Secondary | ICD-10-CM

## 2024-06-25 DIAGNOSIS — D4620 Refractory anemia with excess of blasts, unspecified: Secondary | ICD-10-CM | POA: Diagnosis not present

## 2024-06-25 LAB — CBC WITH DIFFERENTIAL (CANCER CENTER ONLY)
Abs Immature Granulocytes: 0.01 K/uL (ref 0.00–0.07)
Basophils Absolute: 0 K/uL (ref 0.0–0.1)
Basophils Relative: 0 %
Eosinophils Absolute: 0 K/uL (ref 0.0–0.5)
Eosinophils Relative: 1 %
HCT: 31.8 % — ABNORMAL LOW (ref 39.0–52.0)
Hemoglobin: 10 g/dL — ABNORMAL LOW (ref 13.0–17.0)
Immature Granulocytes: 0 %
Lymphocytes Relative: 42 %
Lymphs Abs: 1.1 K/uL (ref 0.7–4.0)
MCH: 24.2 pg — ABNORMAL LOW (ref 26.0–34.0)
MCHC: 31.4 g/dL (ref 30.0–36.0)
MCV: 77 fL — ABNORMAL LOW (ref 80.0–100.0)
Monocytes Absolute: 0.6 K/uL (ref 0.1–1.0)
Monocytes Relative: 21 %
Neutro Abs: 1 K/uL — ABNORMAL LOW (ref 1.7–7.7)
Neutrophils Relative %: 36 %
Platelet Count: 25 K/uL — ABNORMAL LOW (ref 150–400)
RBC: 4.13 MIL/uL — ABNORMAL LOW (ref 4.22–5.81)
RDW: 22.5 % — ABNORMAL HIGH (ref 11.5–15.5)
WBC Count: 2.7 K/uL — ABNORMAL LOW (ref 4.0–10.5)
nRBC: 0 % (ref 0.0–0.2)

## 2024-06-25 LAB — CMP (CANCER CENTER ONLY)
ALT: 6 U/L (ref 0–44)
AST: 24 U/L (ref 15–41)
Albumin: 4.3 g/dL (ref 3.5–5.0)
Alkaline Phosphatase: 62 U/L (ref 38–126)
Anion gap: 12 (ref 5–15)
BUN: 21 mg/dL (ref 8–23)
CO2: 22 mmol/L (ref 22–32)
Calcium: 9.4 mg/dL (ref 8.9–10.3)
Chloride: 106 mmol/L (ref 98–111)
Creatinine: 1.13 mg/dL (ref 0.61–1.24)
GFR, Estimated: >60 mL/min
Glucose, Bld: 105 mg/dL — ABNORMAL HIGH (ref 70–99)
Potassium: 4.1 mmol/L (ref 3.5–5.1)
Sodium: 140 mmol/L (ref 135–145)
Total Bilirubin: 0.9 mg/dL (ref 0.0–1.2)
Total Protein: 7.2 g/dL (ref 6.5–8.1)

## 2024-06-25 LAB — SAMPLE TO BLOOD BANK

## 2024-06-25 MED ORDER — EPOETIN ALFA 20000 UNIT/ML IJ SOLN
20000.0000 [IU] | Freq: Once | INTRAMUSCULAR | Status: AC
  Administered 2024-06-25: 20000 [IU] via SUBCUTANEOUS
  Filled 2024-06-25: qty 1

## 2024-06-26 DIAGNOSIS — D699 Hemorrhagic condition, unspecified: Secondary | ICD-10-CM | POA: Diagnosis not present

## 2024-06-26 DIAGNOSIS — I10 Essential (primary) hypertension: Secondary | ICD-10-CM | POA: Diagnosis not present

## 2024-06-26 DIAGNOSIS — L03116 Cellulitis of left lower limb: Secondary | ICD-10-CM | POA: Diagnosis not present

## 2024-06-26 DIAGNOSIS — D469 Myelodysplastic syndrome, unspecified: Secondary | ICD-10-CM | POA: Diagnosis not present

## 2024-06-26 DIAGNOSIS — I2510 Atherosclerotic heart disease of native coronary artery without angina pectoris: Secondary | ICD-10-CM | POA: Diagnosis not present

## 2024-06-26 DIAGNOSIS — M545 Low back pain, unspecified: Secondary | ICD-10-CM | POA: Diagnosis not present

## 2024-06-26 DIAGNOSIS — G2581 Restless legs syndrome: Secondary | ICD-10-CM | POA: Diagnosis not present

## 2024-06-26 DIAGNOSIS — M48061 Spinal stenosis, lumbar region without neurogenic claudication: Secondary | ICD-10-CM | POA: Diagnosis not present

## 2024-06-26 DIAGNOSIS — N400 Enlarged prostate without lower urinary tract symptoms: Secondary | ICD-10-CM | POA: Diagnosis not present

## 2024-06-26 DIAGNOSIS — I872 Venous insufficiency (chronic) (peripheral): Secondary | ICD-10-CM | POA: Diagnosis not present

## 2024-06-26 DIAGNOSIS — K219 Gastro-esophageal reflux disease without esophagitis: Secondary | ICD-10-CM | POA: Diagnosis not present

## 2024-06-26 DIAGNOSIS — M5416 Radiculopathy, lumbar region: Secondary | ICD-10-CM | POA: Diagnosis not present

## 2024-06-30 ENCOUNTER — Ambulatory Visit: Payer: Self-pay

## 2024-06-30 NOTE — Telephone Encounter (Signed)
" °  FYI Only or Action Required?: FYI only for provider: appointment scheduled on 12.31.25.  Patient was last seen in primary care on 06/09/2024 by Plotnikov, Karlynn GAILS, MD.  Called Nurse Triage reporting Open Wound.  Symptoms began today.  Interventions attempted: Nothing.  Symptoms are: stable.  Triage Disposition: Home Care  Patient/caregiver understands and will follow disposition?: Yes   Copied from CRM #[id number]. Topic: Clinical - Red Word Triage >> Jun 30, 2024  4:21 PM Shanda MATSU wrote: Red Word that prompted transfer to Nurse Triage: Patient's daughter calling in to report that nurse that came out today to see patient caller her to adv that patient has wound on left leg, nurse that came out to see patient did wrap wound. Reason for Disposition  Minor cut or scratch  Answer Assessment - Initial Assessment Questions 1. ONSET: How long ago did the injury occur?       Home health nurse noticed today  2. LOCATION: Where is the injury located?       Left leg   3. PAIN: Is there any pain? If Yes, ask: How bad is the pain? (Scale 0-10; or none, mild, moderate, severe)      Denies 4. MECHANISM: Tell me how it happened.  unsure         Pt's daughter reports open wound on leg Pt is taking an antibiotic prescribed previously by infectious disease. Daughter unsure of name of abx. Home health nurse wrapped wound. Pt's daughter advised to await plan of care at appt in two days as it pertains to self medicating Pt scheduled for a visit on 12.31.25  for further evaluation. Pt agrees with plan of care, will call back for any worsening symptoms  Protocols used: Cuts and Lacerations-A-AH  "

## 2024-07-02 ENCOUNTER — Encounter: Payer: Self-pay | Admitting: Emergency Medicine

## 2024-07-02 ENCOUNTER — Ambulatory Visit: Admitting: Emergency Medicine

## 2024-07-02 VITALS — BP 124/82 | HR 51 | Temp 97.9°F | Ht 69.0 in | Wt 146.0 lb

## 2024-07-02 DIAGNOSIS — L089 Local infection of the skin and subcutaneous tissue, unspecified: Secondary | ICD-10-CM

## 2024-07-02 DIAGNOSIS — L03116 Cellulitis of left lower limb: Secondary | ICD-10-CM

## 2024-07-02 DIAGNOSIS — T148XXA Other injury of unspecified body region, initial encounter: Secondary | ICD-10-CM

## 2024-07-02 NOTE — Patient Instructions (Signed)
 Continue oral antibiotic and topical antibiotic Follow-up with your PCP next week  Cellulitis, Adult  Cellulitis is a skin infection. The infected area is often warm, red, swollen, and sore. It occurs most often on the legs, feet, and toes, but can happen on any part of the body. This condition can be life-threatening without treatment. It is very important to get treated right away. What are the causes? This condition is caused by bacteria. The bacteria enter through a break in the skin, such as: A cut. A burn. A bug bite. An animal bite. An open sore. A crack. What increases the risk? Having a weak body's defense system (immune system). Being older than 85 years old. Having a blood sugar problem (diabetes). Having a long-term liver disease (cirrhosis) or kidney disease. Being very overweight (obese). Having a skin problem, such as: An itchy rash. A rash caused by a fungus. A rash with blisters. Slow movement of blood in the veins (venous stasis). Fluid buildup below the skin (edema). This condition is more likely to occur in people who: Have open cuts, burns, bites, or scrapes on the skin. Have been treated with high-energy rays (radiation). Use IV drugs. What are the signs or symptoms? Skin that: Looks red or purple, or slightly darker than your usual skin color. Has streaks. Has spots. Is swollen. Is sore or painful when you touch it. Is warm. A fever. Chills. Blisters. Tiredness (fatigue). How is this treated? Medicines to treat infections or allergies. Rest. Placing cold or warm cloths on the skin. Staying in the hospital, if the condition is very bad. You may need medicines through an IV. Follow these instructions at home: Medicines Take over-the-counter and prescription medicines only as told by your doctor. If you were prescribed antibiotics, take them as told by your doctor. Do not stop using them even if you start to feel better. General  instructions Drink enough fluid to keep your pee (urine) pale yellow. Do not touch or rub the infected area. Raise (elevate) the infected area above the level of your heart while you are sitting or lying down. Return to your normal activities when your doctor says that it is safe. Place cold or warm cloths on the area as told by your doctor. Keep all follow-up visits. Your doctor will need to make sure that a more serious infection is not developing. Contact a doctor if: You have a fever. You do not start to get better after 1-2 days of treatment. Your bone or joint under the infected area starts to hurt after the skin has healed. Your infection comes back in the same area or another area. Signs of this may include: You have a swollen bump in the area. Your red area gets larger, turns dark in color, or hurts more. You have more fluid coming from the wound. Pus or a bad smell develops in your infected area. You have more pain. You feel sick and have muscle aches and weakness. You develop vomiting or watery poop that will not go away. Get help right away if: You see red streaks coming from the area. You notice the skin turns purple or black and falls off. These symptoms may be an emergency. Get help right away. Call 911. Do not wait to see if the symptoms will go away. Do not drive yourself to the hospital. This information is not intended to replace advice given to you by your health care provider. Make sure you discuss any questions you have with your  health care provider. Document Revised: 02/14/2022 Document Reviewed: 02/14/2022 Elsevier Patient Education  2024 [employer].

## 2024-07-02 NOTE — Assessment & Plan Note (Signed)
 Currently active.  Just recently started on cephalosporin Continue and finish antibiotic Topical wound care instructions given Need follow-up with PCP next week

## 2024-07-02 NOTE — Assessment & Plan Note (Signed)
 Topical wound care instructions given Recommend Bactroban  twice a day along with oral cephalosporin Follow-up with PCP next week Contact the office if no better or worse during the next several days

## 2024-07-02 NOTE — Progress Notes (Signed)
 Justin [name] [place]. 83 y.o.   Chief Complaint  Patient presents with   Wound Check    Pt grandson states that the nurse noticed it on Monday. Pt states that last Thursday the leg was bothering him and then at the time it was not read and swollen the infectious disease did put him on the antibiotic as needed but he only took one he is no longer on it currently     HISTORY OF PRESENT ILLNESS: This is a 83 y.o. male complaining of infected wound to left lower extremity Just started cephalosporin antibiotic at home No other complaints or medical concerns today.  Wound Check     Prior to Admission medications  Medication Sig Start Date End Date Taking? Authorizing Provider  acetaminophen  (TYLENOL ) 500 MG tablet Take 1,000 mg by mouth every 6 (six) hours as needed for mild pain (pain score 1-3).   Yes [provider]  amLODipine  (NORVASC ) 5 MG tablet TAKE 1 TABLET BY MOUTH DAILY Patient taking differently: Take 5 mg by mouth at bedtime. 05/05/24  Yes Plotnikov, Aleksei V, MD  artificial tears ophthalmic solution Place 1 drop into both eyes as needed for dry eyes (At night). 06/01/24  Yes Fausto Sor A, DO  calcium  carbonate (TUMS EX) 750 MG chewable tablet Chew 1 tablet by mouth 2 (two) times daily as needed for heartburn.   Yes [provider]  carbidopa -levodopa  (SINEMET  IR) 25-100 MG tablet For breakthrough restless leg, you can take 1 tablet at bedtime.  OK to take extra tab as needed during the day. Patient taking differently: Take 1 tablet by mouth at bedtime. 03/22/20  Yes Patel, Donika K, DO  Cholecalciferol  (VITAMIN D3) 1000 units CAPS Take 1,000 Units by mouth in the morning and at bedtime.   Yes [provider]  Cyanocobalamin  (VITAMIN B-12) 1000 MCG SUBL DISSOLVE 2 TABLETS IN MOUTH EVERY DAY Patient taking differently: Place 2,000 mcg under the tongue daily. 05/10/21  Yes Plotnikov, Aleksei V, MD  finasteride  (PROSCAR ) 5 MG tablet TAKE 1 TABLET (5  MG TOTAL) BY MOUTH DAILY. 08/24/23  Yes Plotnikov, Karlynn GAILS, MD  furosemide  (LASIX ) 20 MG tablet Take 1 tablet (20 mg total) by mouth daily. Patient taking differently: Take 20 mg by mouth daily as needed. 06/02/24  Yes Fausto Sor A, DO  gabapentin  (NEURONTIN ) 300 MG capsule Take 1 capsule (300 mg total) by mouth 3 (three) times daily. 05/19/24  Yes Plotnikov, Aleksei V, MD  ibuprofen (ADVIL) 200 MG tablet Take 400 mg by mouth every 8 (eight) hours as needed for mild pain (pain score 1-3) (if not taking Tylenol ).   Yes [provider]  magnesium  hydroxide (MILK OF MAGNESIA) 400 MG/5ML suspension Take 15 mLs by mouth daily as needed for mild constipation.   Yes [provider]  Multiple Vitamins-Minerals (PRESERVISION AREDS PO) Take 1 capsule by mouth 2 (two) times daily.   Yes [provider]  mupirocin  ointment (BACTROBAN ) 2 % Apply 1 Application topically 2 (two) times daily. Patient taking differently: Apply 1 Application topically See admin instructions. Apply to affected toe on the right foot once a day 04/28/24  Yes Gershon Donnice SAUNDERS, DPM  ondansetron  (ZOFRAN ) 4 MG tablet Take 1 tablet (4 mg total) by mouth every 6 (six) hours as needed for nausea or vomiting. 10/26/23  Yes Laurence Locus, DO  Oxycodone  HCl 10 MG TABS Take 1 tablet (10 mg total) by mouth every 4 (four) hours as needed (pain). 06/01/24  Yes  Fausto Sor A, DO  Polyethyl Glycol-Propyl Glycol (SYSTANE OP) Place 1 drop into both eyes 2 (two) times daily as needed (for dryness).   Yes [provider]  pravastatin  (PRAVACHOL ) 20 MG tablet TAKE 1 TABLET (20 MG TOTAL) BY MOUTH DAILY. 08/24/23  Yes Plotnikov, Karlynn GAILS, MD  rOPINIRole  (REQUIP ) 2 MG tablet TAKE 1 TABLET BY MOUTH AT AT NOON AND 1 TAB AT 4PM AND 1 TAB AT BEDTIME Patient taking differently: Take 2 mg by mouth 2 (two) times daily as needed (for restless legs). 05/19/24  Yes Plotnikov, Karlynn GAILS, MD  tamsulosin  (FLOMAX ) 0.4 MG CAPS capsule  TAKE 1 CAPSULE (0.4 MG TOTAL) BY MOUTH IN THE MORNING. 11/22/23  Yes Plotnikov, Karlynn GAILS, MD  terazosin  (HYTRIN ) 2 MG capsule TAKE 1 CAPSULE BY MOUTH EVERY NIGHT AT BEDTIME 05/05/24  Yes Plotnikov, Karlynn GAILS, MD    Allergies[1]  Patient Active Problem List   Diagnosis Date Noted   Cellulitis of left lower extremity 06/18/2024   Rib pain on right side 05/26/2024   Cannot walk 05/26/2024   Left leg cellulitis 05/26/2024   Effusion of bursa of elbow, left 04/04/2024   Bursitis of left elbow 02/13/2024   Cellulitis and abscess of left leg 02/08/2024   Cellulitis of left leg 02/07/2024   Intertrigo 12/16/2023   MSSA (methicillin susceptible Staphylococcus aureus) infection 12/11/2023   Swelling of lower leg 11/17/2023   Medication management 11/17/2023   Erysipelas of both lower extremities 11/12/2023   Urinary incontinence 11/12/2023   Septic bursitis of elbow 11/06/2023   Abscess of epidural space of spine due to bacteria 10/25/2023   Epidural abscess 10/25/2023   MSSA bacteremia 10/22/2023   Hypokalemia 10/21/2023   Acute on chronic pain of left elbow concerning for septic arthritis 10/21/2023   Spinal stenosis of lumbar region 10/16/2023   Pseudophakia of both eyes 02/15/2022   MDS (myelodysplastic syndrome) (HCC) 12/25/2021   Abnormal EKG 11/18/2021   Thrombocytopenia 11/18/2021   Lumbar spondylosis 07/22/2021   Lumbar radiculopathy 03/24/2021   S/P total knee arthroplasty, right 11/09/2020   Weight loss 08/30/2020   Complication associated with orthopedic device 06/10/2020   Diastolic dysfunction 10/27/2019   Exudative age-related macular degeneration of left eye with active choroidal neovascularization (HCC) 10/15/2019   Intermediate stage nonexudative age-related macular degeneration of left eye 10/15/2019   Early stage nonexudative age-related macular degeneration of right eye 10/15/2019   Pseudophakia 10/15/2019   Posterior vitreous detachment of left eye 10/15/2019    GERD (gastroesophageal reflux disease) 11/13/2018   Coronary artery disease 10/24/2018   Claudication 02/22/2018   Anemia 08/28/2017   DOE (dyspnea on exertion) 07/31/2017   Fatigue 07/31/2017   Essential hypertension 11/29/2016   Smoking greater than 30 pack years 02/02/2016   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) 02/28/2013   Ingrowing toenail of left foot 08/29/2012   Allergic rhinitis 10/10/2011   Dupuytren's contracture of hand 07/05/2011   Vitamin B12 deficiency 12/23/2010   BPH (benign prostatic hyperplasia) 12/23/2010   Low back pain 07/27/2009   TOBACCO USE, QUIT 07/27/2009   Diverticulosis of colon 03/28/2009   RLS (restless legs syndrome) 10/28/2008   SKIN RASH, ALLERGIC 10/01/2008   Edema 10/01/2008   Neuropathy 04/22/2008   Vitamin D  deficiency 01/21/2008   INSOMNIA, CHRONIC 12/23/2007   Osteoarthritis 12/23/2007   CAROTID BRUIT, LEFT 12/23/2007    Past Medical History:  Diagnosis Date   Alcoholism (HCC)    Dry 13 years   Basal cell carcinoma    Right  Chest   BPH (benign prostatic hyperplasia)    Diverticulosis of colon    Dizzy spells    none recent   DJD (degenerative joint disease)    lower back   Elbow fracture, left 2009   Dr Celena   FRACTURE, [place], LEFT 12/23/2007   Qualifier: Diagnosis of   By: Georgian ROSALEA CHARM Lamar        GERD (gastroesophageal reflux disease)    Glaucoma    History of TIAs 1 yrs ago   Hyperkeratosis    LBP (low back pain)    Macular degeneration    left wet right dry sees dr elner   Melanoma [facility]) 2009   x3 Dr Amy Jordan   Osteoarthritis    Restless leg syndrome    Thrombocytopenia 11/18/2021   Tinnitus    Tobacco abuse    Uses walker    Wears glasses    reading   Wears partial dentures    upper    Past Surgical History:  Procedure Laterality Date   CATARACT EXTRACTION Bilateral 2018   HARDWARE REMOVAL Left 07/09/2020   Procedure: Left elbow hardware removal;  Surgeon: Sharl Selinda Dover, MD;  Location: [facility];  Service: Orthopedics;  Laterality: Left;  60 mins   HARDWARE REMOVAL Left 10/23/2023   Procedure: REMOVAL, HARDWARE;  Surgeon: Celena Sharper, MD;  Location: [facility] OR;  Service: Orthopedics;  Laterality: Left;   IRRIGATION AND DEBRIDEMENT ELBOW Left 10/23/2023   Procedure: IRRIGATION AND DEBRIDEMENT ELBOW;  Surgeon: Celena Sharper, MD;  Location: [facility] OR;  Service: Orthopedics;  Laterality: Left;   mda     Dr. Elner wet injection   MELANOMA EXCISION  2009   x3   RECONSTRUCTION MEDIAL COLLATERAL LIGAMENT ELBOW W/ TENDON GRAFT  2009   Left, Dr Celena   TONSILLECTOMY  as child   TOTAL KNEE ARTHROPLASTY Right 11/09/2020   Procedure: TOTAL KNEE ARTHROPLASTY;  Surgeon: Ernie Cough, MD;  Location: [facility] ORS;  Service: Orthopedics;  Laterality: Right;   TRANSESOPHAGEAL ECHOCARDIOGRAM (CATH LAB) N/A 10/24/2023   Procedure: TRANSESOPHAGEAL ECHOCARDIOGRAM;  Surgeon: Jeffrie Oneil BROCKS, MD;  Location: [facility] INVASIVE CV LAB;  Service: Cardiovascular;  Laterality: N/A;    Social History   Socioeconomic History   Marital status: Married    Spouse name: Rollene Caldron   Number of children: 2   Years of education: Not on file   Highest education level: Not on file  Occupational History   Occupation: [employer]    Employer: RETIRED   Occupation: Scientist, Research (physical Sciences)   Occupation: Games Developer  Tobacco Use   Smoking status: Former    Current packs/day: 0.00    Average packs/day: 3.0 packs/day for 38.0 years (114.0 ttl pk-yrs)    Types: Cigarettes    Start date: 15    Quit date: 1986    Years since quitting: 40.0   Smokeless tobacco: Never   Tobacco comments:    states he quit May 15 1985  Vaping Use   Vaping status: Never Used  Substance and Sexual Activity   Alcohol  use: No    Comment: PREVIOUS - DRY 67yrs   Drug use: Not Currently   Sexual activity: Not Currently  Other Topics Concern   Not on file  Social History Narrative   Right handed   One story home   Drinks  coffee q am      Son and wife lives with him-2025   Social Drivers of Health  Tobacco Use: Medium Risk (06/18/2024)   Patient History    Smoking Tobacco Use: Former    Smokeless Tobacco Use: Never    Passive Exposure: Not on file  Financial Resource Strain: Low Risk (07/20/2023)   Overall Financial Resource Strain (CARDIA)    Difficulty of Paying Living Expenses: Not very hard  Food Insecurity: No Food Insecurity (05/27/2024)   Epic    Worried About Programme Researcher, Broadcasting/film/video in the Last Year: Never true    Ran Out of Food in the Last Year: Never true  Transportation Needs: No Transportation Needs (05/27/2024)   Epic    Lack of Transportation (Medical): No    Lack of Transportation (Non-Medical): No  Physical Activity: Inactive (07/20/2023)   Exercise Vital Sign    Days of Exercise per Week: 0 days    Minutes of Exercise per Session: 0 min  Stress: No Stress Concern Present (07/20/2023)   [employer] of Occupational Health - Occupational Stress Questionnaire    Feeling of Stress : Not at all  Social Connections: Moderately Isolated (05/27/2024)   Social Connection and Isolation Panel    Frequency of Communication with Friends and Family: More than three times a week    Frequency of Social Gatherings with Friends and Family: More than three times a week    Attends Religious Services: Never    Database Administrator or Organizations: No    Attends Banker Meetings: Never    Marital Status: Married  Catering Manager Violence: Not At Risk (05/27/2024)   Epic    Fear of Current or Ex-Partner: No    Emotionally Abused: No    Physically Abused: No    Sexually Abused: No  Depression (PHQ2-9): Medium Risk (06/09/2024)   Depression (PHQ2-9)    PHQ-2 Score: 9  Alcohol  Screen: Low Risk (07/20/2023)   Alcohol  Screen    Last Alcohol  Screening Score (AUDIT): 0  Housing: Unknown (05/27/2024)   Epic    Unable to Pay for Housing in the Last Year: No    Number of Times Moved  in the Last Year: Not on file    Homeless in the Last Year: No  Utilities: At Risk (05/27/2024)   Epic    Threatened with loss of utilities: Yes  Health Literacy: Adequate Health Literacy (07/20/2023)   B1300 Health Literacy    Frequency of need for help with medical instructions: Never    Family History  Problem Relation Age of Onset   Cancer Mother        ?breast   Cancer Father        Lung     Review of Systems  Constitutional: Negative.  Negative for chills and fever.  HENT: Negative.  Negative for congestion and sore throat.   Respiratory: Negative.  Negative for cough and shortness of breath.   Cardiovascular: Negative.  Negative for chest pain and palpitations.  Gastrointestinal:  Negative for abdominal pain, diarrhea, nausea and vomiting.  Genitourinary: Negative.  Negative for dysuria and hematuria.  Skin:        Wound infection  Neurological: Negative.  Negative for dizziness and headaches.  All other systems reviewed and are negative.   Vitals:   07/02/24 1009  BP: 124/82  Pulse: (!) 51  Temp: 97.9 F (36.6 C)  SpO2: 96%    Physical Exam Vitals reviewed.  Constitutional:      Appearance: Normal appearance.  HENT:     Head: Normocephalic.  Eyes:  Extraocular Movements: Extraocular movements intact.  Cardiovascular:     Rate and Rhythm: Normal rate.  Pulmonary:     Effort: Pulmonary effort is normal.  Skin:    General: Skin is warm and dry.     Comments: Wound infection with surrounding cellulitis as pictured below  Neurological:     Mental Status: He is alert and oriented to person, place, and time.  Psychiatric:        Mood and Affect: Mood normal.        Behavior: Behavior normal.      ASSESSMENT & PLAN: Problem List Items Addressed This Visit       Other   Cellulitis of left lower leg   Currently active.  Just recently started on cephalosporin Continue and finish antibiotic Topical wound care instructions given Need follow-up  with PCP next week      Wound infection - Primary   Topical wound care instructions given Recommend Bactroban  twice a day along with oral cephalosporin Follow-up with PCP next week Contact the office if no better or worse during the next several days      Patient Instructions  Continue oral antibiotic and topical antibiotic Follow-up with your PCP next week  Cellulitis, Adult  Cellulitis is a skin infection. The infected area is often warm, red, swollen, and sore. It occurs most often on the legs, feet, and toes, but can happen on any part of the body. This condition can be life-threatening without treatment. It is very important to get treated right away. What are the causes? This condition is caused by bacteria. The bacteria enter through a break in the skin, such as: A cut. A burn. A bug bite. An animal bite. An open sore. A crack. What increases the risk? Having a weak body's defense system (immune system). Being older than 85 years old. Having a blood sugar problem (diabetes). Having a long-term liver disease (cirrhosis) or kidney disease. Being very overweight (obese). Having a skin problem, such as: An itchy rash. A rash caused by a fungus. A rash with blisters. Slow movement of blood in the veins (venous stasis). Fluid buildup below the skin (edema). This condition is more likely to occur in people who: Have open cuts, burns, bites, or scrapes on the skin. Have been treated with high-energy rays (radiation). Use IV drugs. What are the signs or symptoms? Skin that: Looks red or purple, or slightly darker than your usual skin color. Has streaks. Has spots. Is swollen. Is sore or painful when you touch it. Is warm. A fever. Chills. Blisters. Tiredness (fatigue). How is this treated? Medicines to treat infections or allergies. Rest. Placing cold or warm cloths on the skin. Staying in the hospital, if the condition is very bad. You may need medicines  through an IV. Follow these instructions at home: Medicines Take over-the-counter and prescription medicines only as told by your doctor. If you were prescribed antibiotics, take them as told by your doctor. Do not stop using them even if you start to feel better. General instructions Drink enough fluid to keep your pee (urine) pale yellow. Do not touch or rub the infected area. Raise (elevate) the infected area above the level of your heart while you are sitting or lying down. Return to your normal activities when your doctor says that it is safe. Place cold or warm cloths on the area as told by your doctor. Keep all follow-up visits. Your doctor will need to make sure that a more serious  infection is not developing. Contact a doctor if: You have a fever. You do not start to get better after 1-2 days of treatment. Your bone or joint under the infected area starts to hurt after the skin has healed. Your infection comes back in the same area or another area. Signs of this may include: You have a swollen bump in the area. Your red area gets larger, turns dark in color, or hurts more. You have more fluid coming from the wound. Pus or a bad smell develops in your infected area. You have more pain. You feel sick and have muscle aches and weakness. You develop vomiting or watery poop that will not go away. Get help right away if: You see red streaks coming from the area. You notice the skin turns purple or black and falls off. These symptoms may be an emergency. Get help right away. Call 911. Do not wait to see if the symptoms will go away. Do not drive yourself to the hospital. This information is not intended to replace advice given to you by your health care provider. Make sure you discuss any questions you have with your health care provider. Document Revised: 02/14/2022 Document Reviewed: 02/14/2022 Elsevier Patient Education  2024 Elsevier Inc.    Emil Schaumann, MD   [facility] at [facility]    [1]  Allergies Allergen Reactions   Tape Other (See Comments)    SKIN IS VERY THIN AND WILL TEAR AND BRUISE EASILY!!! Paper tape is ok.   Levofloxacin Other (See Comments)    Hands peeled

## 2024-07-08 NOTE — Progress Notes (Unsigned)
 " Justin [name] [phone]   [phone]  PROGRESS NOTE  Patient Care Team: Plotnikov, Karlynn GAILS, MD as PCP - General Justin Soyla LABOR, MD as PCP - Cardiology (Cardiology) Justin Dover, MD as Consulting Physician (Gastroenterology) [name], Amy, MD as Consulting Physician (Dermatology) Justin Tonita POUR, DO as Consulting Physician (Neurology) Justin Ade, MD as Consulting Physician (Physical Medicine and Rehabilitation) Justin Cough, MD as Consulting Physician (Orthopedic Surgery) Justin [name] [name], [facility] (Inactive) as Pharmacist (Pharmacist) Justin Justin ONEIDA MADISON, MD as Consulting Physician (Hematology and Oncology) Justin Sharper, MD as Consulting Physician (Orthopedic Surgery) Justin Shiner, MD as Consulting Physician (Infectious Diseases) Justin [name], Paramedic as Paramedic  Hematological/Oncological History # Pancytopenia #Myelodysplastic Syndrome, Single Lineage Dysplasia 11/18/2021: WBC 2.4, ANC 1.1, Hgb 9.7, MCV 84.0, Plt 58. 12/07/2021: Establish care with Justin. Federico and Justin [name] 12/19/2021: Bone marrow biopsy which showed a low-grade myelodysplastic syndrome.  6/28/203: Started retacrit  20,000 units q 2 weeks  Interval History:  Justin [name]. 84 y.o. male with medical history significant for newly diagnosed low-grade myelodysplastic syndrome who presents for a follow up visit. The patient's last visit was on 04/17/2024. In the interim, he continues on retacrit  injections q 2 weeks.   On exam today Justin [name] is accompanied by his daughter.  He reports he has been well overall in interim since our last visit.  He reports his energy levels are low and his weight has been declining slightly.  He reports that he does also feel like his balance is weakening.  He has had no falls but does feel little unsteady on his feet.  He reports that unfortunately was in the hospital over Thanksgiving.  He reports that he does have some occasional  bruising but no overt signs of bleeding in the urine or stool.  He reports that he otherwise is in his assigned level of health.  He has no additional questions concerns or complaints today.  He denies any fevers, chills, sweats, nausea, vomiting or diarrhea a full 10 point ROS otherwise negative.  MEDICAL HISTORY:  Past Medical History:  Diagnosis Date   Alcoholism (HCC)    Dry 13 years   Basal cell carcinoma    Right Chest   BPH (benign prostatic hyperplasia)    Diverticulosis of colon    Dizzy spells    none recent   DJD (degenerative joint disease)    lower back   Elbow fracture, left 2009   Justin [name]   FRACTURE, [place], LEFT 12/23/2007   Qualifier: Diagnosis of   By: Justin [name]        GERD (gastroesophageal reflux disease)    Glaucoma    History of TIAs 1 yrs ago   Hyperkeratosis    LBP (low back pain)    Macular degeneration    left wet right dry sees Justin [name]   Melanoma [facility]) 2009   x3 Justin [name]   Osteoarthritis    Restless leg syndrome    Thrombocytopenia 11/18/2021   Tinnitus    Tobacco abuse    Uses walker    Wears glasses    reading   Wears partial dentures    upper    SURGICAL HISTORY: Past Surgical History:  Procedure Laterality Date   CATARACT EXTRACTION Bilateral 2018   HARDWARE REMOVAL Left 07/09/2020   Procedure: Left elbow hardware removal;  Surgeon: Justin Selinda Dover, MD;  Location: [name];  Service: Orthopedics;  Laterality: Left;  60 mins  HARDWARE REMOVAL Left 10/23/2023   Procedure: REMOVAL, HARDWARE;  Surgeon: Justin Sharper, MD;  Location: [facility] OR;  Service: Orthopedics;  Laterality: Left;   IRRIGATION AND DEBRIDEMENT ELBOW Left 10/23/2023   Procedure: IRRIGATION AND DEBRIDEMENT ELBOW;  Surgeon: Justin Sharper, MD;  Location: [name] OR;  Service: Orthopedics;  Laterality: Left;   mda     Justin. Elner wet injection   MELANOMA EXCISION  2009   x3   RECONSTRUCTION MEDIAL COLLATERAL LIGAMENT ELBOW W/ TENDON GRAFT  2009    Left, Justin [name]   TONSILLECTOMY  as child   TOTAL KNEE ARTHROPLASTY Right 11/09/2020   Procedure: TOTAL KNEE ARTHROPLASTY;  Surgeon: Justin Cough, MD;  Location: [facility] ORS;  Service: Orthopedics;  Laterality: Right;   TRANSESOPHAGEAL ECHOCARDIOGRAM (CATH LAB) N/A 10/24/2023   Procedure: TRANSESOPHAGEAL ECHOCARDIOGRAM;  Surgeon: Justin Oneil BROCKS, MD;  Location: [facility] INVASIVE CV LAB;  Service: Cardiovascular;  Laterality: N/A;    SOCIAL HISTORY: Social History   Socioeconomic History   Marital status: Married    Spouse name: Justin [name]   Number of children: 2   Years of education: Not on file   Highest education level: Not on file  Occupational History   Occupation: [employer] - [employer]    Employer: RETIRED   Occupation: Scientist, Research (physical Sciences)   Occupation: Games Developer  Tobacco Use   Smoking status: Former    Current packs/day: 0.00    Average packs/day: 3.0 packs/day for 38.0 years (114.0 ttl pk-yrs)    Types: Cigarettes    Start date: 32    Quit date: 1986    Years since quitting: 40.0   Smokeless tobacco: Never   Tobacco comments:    states he quit May 15 1985  Vaping Use   Vaping status: Never Used  Substance and Sexual Activity   Alcohol  use: No    Comment: PREVIOUS - DRY 16yrs   Drug use: Not Currently   Sexual activity: Not Currently  Other Topics Concern   Not on file  Social History Narrative   Right handed   One story home   Drinks coffee q am      Son and wife lives with him-2025   Social Drivers of Health   Tobacco Use: Medium Risk (07/02/2024)   Patient History    Smoking Tobacco Use: Former    Smokeless Tobacco Use: Never    Passive Exposure: Not on Actuary Strain: Low Risk (07/20/2023)   Overall Financial Resource Strain (CARDIA)    Difficulty of Paying Living Expenses: Not very hard  Food Insecurity: No Food Insecurity (05/27/2024)   Epic    Worried About Radiation Protection Practitioner of Food in the Last Year: Never true    Ran Out of  Food in the Last Year: Never true  Transportation Needs: No Transportation Needs (05/27/2024)   Epic    Lack of Transportation (Medical): No    Lack of Transportation (Non-Medical): No  Physical Activity: Inactive (07/20/2023)   Exercise Vital Sign    Days of Exercise per Week: 0 days    Minutes of Exercise per Session: 0 min  Stress: No Stress Concern Present (07/20/2023)   [employer] of Occupational Health - Occupational Stress Questionnaire    Feeling of Stress : Not at all  Social Connections: Moderately Isolated (05/27/2024)   Social Connection and Isolation Panel    Frequency of Communication with Friends and Family: More than three times a week    Frequency of Social Gatherings with Friends  and Family: More than three times a week    Attends Religious Services: Never    Active Member of Clubs or Organizations: No    Attends Banker Meetings: Never    Marital Status: Married  Catering Manager Violence: Not At Risk (05/27/2024)   Epic    Fear of Current or Ex-Partner: No    Emotionally Abused: No    Physically Abused: No    Sexually Abused: No  Depression (PHQ2-9): Low Risk (07/10/2024)   Depression (PHQ2-9)    PHQ-2 Score: 0  Recent Concern: Depression (PHQ2-9) - Medium Risk (06/09/2024)   Depression (PHQ2-9)    PHQ-2 Score: 9  Alcohol  Screen: Low Risk (07/20/2023)   Alcohol  Screen    Last Alcohol  Screening Score (AUDIT): 0  Housing: Unknown (05/27/2024)   Epic    Unable to Pay for Housing in the Last Year: No    Number of Times Moved in the Last Year: Not on file    Homeless in the Last Year: No  Utilities: At Risk (05/27/2024)   Epic    Threatened with loss of utilities: Yes  Health Literacy: Adequate Health Literacy (07/20/2023)   B1300 Health Literacy    Frequency of need for help with medical instructions: Never    FAMILY HISTORY: Family History  Problem Relation Age of Onset   Cancer Mother        ?breast   Cancer Father        Lung     ALLERGIES:  is allergic to tape and levofloxacin.  MEDICATIONS:  Current Outpatient Medications  Medication Sig Dispense Refill   cefadroxil  (DURICEF) 500 MG capsule Take 1,000 mg by mouth 2 (two) times daily.     acetaminophen  (TYLENOL ) 500 MG tablet Take 1,000 mg by mouth every 6 (six) hours as needed for mild pain (pain score 1-3).     amLODipine  (NORVASC ) 5 MG tablet TAKE 1 TABLET BY MOUTH DAILY (Patient taking differently: Take 5 mg by mouth at bedtime.) 90 tablet 3   artificial tears ophthalmic solution Place 1 drop into both eyes as needed for dry eyes (At night).     calcium  carbonate (TUMS EX) 750 MG chewable tablet Chew 1 tablet by mouth 2 (two) times daily as needed for heartburn.     carbidopa -levodopa  (SINEMET  IR) 25-100 MG tablet For breakthrough restless leg, you can take 1 tablet at bedtime.  OK to take extra tab as needed during the day. (Patient taking differently: Take 1 tablet by mouth at bedtime.) 100 tablet 3   Cholecalciferol  (VITAMIN D3) 1000 units CAPS Take 1,000 Units by mouth in the morning and at bedtime.     Cyanocobalamin  (VITAMIN B-12) 1000 MCG SUBL DISSOLVE 2 TABLETS IN MOUTH EVERY DAY (Patient taking differently: Place 2,000 mcg under the tongue daily.) 180 tablet 1   finasteride  (PROSCAR ) 5 MG tablet TAKE 1 TABLET (5 MG TOTAL) BY MOUTH DAILY. 90 tablet 1   furosemide  (LASIX ) 20 MG tablet Take 1 tablet (20 mg total) by mouth daily. (Patient taking differently: Take 20 mg by mouth daily as needed.) 30 tablet 0   gabapentin  (NEURONTIN ) 300 MG capsule Take 1 capsule (300 mg total) by mouth 3 (three) times daily. 270 capsule 2   ibuprofen (ADVIL) 200 MG tablet Take 400 mg by mouth every 8 (eight) hours as needed for mild pain (pain score 1-3) (if not taking Tylenol ).     magnesium  hydroxide (MILK OF MAGNESIA) 400 MG/5ML suspension Take 15 mLs by mouth  daily as needed for mild constipation.     Multiple Vitamins-Minerals (PRESERVISION AREDS PO) Take 1 capsule by  mouth 2 (two) times daily.     mupirocin  ointment (BACTROBAN ) 2 % Apply 1 Application topically 2 (two) times daily. (Patient taking differently: Apply 1 Application topically See admin instructions. Apply to affected toe on the right foot once a day) 30 g 2   ondansetron  (ZOFRAN ) 4 MG tablet Take 1 tablet (4 mg total) by mouth every 6 (six) hours as needed for nausea or vomiting. 30 tablet 0   Oxycodone  HCl 10 MG TABS Take 1 tablet (10 mg total) by mouth every 4 (four) hours as needed (pain).     Polyethyl Glycol-Propyl Glycol (SYSTANE OP) Place 1 drop into both eyes 2 (two) times daily as needed (for dryness).     pravastatin  (PRAVACHOL ) 20 MG tablet TAKE 1 TABLET (20 MG TOTAL) BY MOUTH DAILY. 90 tablet 1   rOPINIRole  (REQUIP ) 2 MG tablet TAKE 1 TABLET BY MOUTH AT AT NOON AND 1 TAB AT 4PM AND 1 TAB AT BEDTIME (Patient taking differently: Take 2 mg by mouth 2 (two) times daily as needed (for restless legs).) 270 tablet 2   tamsulosin  (FLOMAX ) 0.4 MG CAPS capsule TAKE 1 CAPSULE (0.4 MG TOTAL) BY MOUTH IN THE MORNING. 90 capsule 1   terazosin  (HYTRIN ) 2 MG capsule TAKE 1 CAPSULE BY MOUTH EVERY NIGHT AT BEDTIME 90 capsule 1   No current facility-administered medications for this visit.    REVIEW OF SYSTEMS:   Constitutional: ( - ) fevers, ( - )  chills , ( - ) night sweats Eyes: ( - ) blurriness of vision, ( - ) double vision, ( - ) watery eyes Ears, nose, mouth, throat, and face: ( - ) mucositis, ( - ) sore throat Respiratory: ( - ) [name], ( - ) dyspnea, ( - ) wheezes Cardiovascular: ( - ) palpitation, ( - ) chest discomfort, ( - ) lower extremity swelling Gastrointestinal:  ( - ) nausea, ( - ) heartburn, ( - ) change in bowel habits Skin: ( - ) abnormal skin rashes Lymphatics: ( - ) new lymphadenopathy, ( - ) easy bruising Neurological: ( - ) numbness, ( - ) tingling, ( - ) new weaknesses Behavioral/Psych: ( - ) mood change, ( - ) new changes  All other systems were reviewed with the patient  and are negative.  PHYSICAL EXAMINATION:  Vitals:   07/10/24 1024  BP: (!) 157/88  Pulse: 72  Resp: 16  Temp: 98.4 F (36.9 C)  SpO2: 98%    Filed Weights   07/10/24 1024  Weight: 144 lb 3.2 oz (65.4 kg)     GENERAL: Well-appearing elderly Caucasian male, alert, no distress and comfortable SKIN: skin color, texture, turgor are normal, no rashes or significant lesions EYES: conjunctiva are pink and non-injected, sclera clear LUNGS: clear to auscultation and percussion with normal breathing effort HEART: regular rate & rhythm and no murmurs and no lower extremity edema Musculoskeletal: no cyanosis of digits and no clubbing  PSYCH: alert & oriented x 3, fluent speech NEURO: no focal motor/sensory deficits  LABORATORY DATA:  I have reviewed the data as listed    Latest Ref Rng & Units 07/10/2024    9:57 AM 06/25/2024    9:30 AM 06/12/2024    9:23 AM  CBC  WBC 4.0 - 10.5 K/uL 1.7  2.7  3.1   Hemoglobin 13.0 - 17.0 g/dL 89.5  89.9  9.8  Hematocrit 39.0 - 52.0 % 32.8  31.8  31.3   Platelets 150 - 400 K/uL 34  25  77        Latest Ref Rng & Units 07/10/2024    9:57 AM 06/25/2024    9:30 AM 06/12/2024    9:23 AM  CMP  Glucose 70 - 99 mg/dL 94  894  892   BUN 8 - 23 mg/dL 19  21  19    Creatinine 0.61 - 1.24 mg/dL 8.93  8.86  8.66   Sodium 135 - 145 mmol/L 139  140  137   Potassium 3.5 - 5.1 mmol/L 4.2  4.1  4.0   Chloride 98 - 111 mmol/L 106  106  101   CO2 22 - 32 mmol/L 22  22  23    Calcium  8.9 - 10.3 mg/dL 9.2  9.4  9.6   Total Protein 6.5 - 8.1 g/dL 7.3  7.2  7.8   Total Bilirubin 0.0 - 1.2 mg/dL 0.6  0.9  0.7   Alkaline Phos 38 - 126 U/L 65  62  79   AST 15 - 41 U/L 27  24  24    ALT 0 - 44 U/L 8  6  7      Lab Results  Component Value Date   MPROTEIN Not Observed 12/07/2021   Lab Results  Component Value Date   KPAFRELGTCHN 45.3 (H) 12/07/2021   LAMBDASER 28.9 (H) 12/07/2021   KAPLAMBRATIO 1.57 12/07/2021   RADIOGRAPHIC STUDIES: No results  found.   ASSESSMENT & PLAN Justin [name]. Is a 84 y.o. male who returns for a follow up for MDS.   # Myelodysplastic Syndrome, Single Lineage Dysplasia -- At this time Justin [name] has a low-grade myelodysplasia that is anemia predominant. --Given that his hemoglobin is consistently less than 10 I would recommend we proceed with erythropoietin  therapy. Discussed with the patient other options including hypomethylating therapies and Luspatercept , however given that anemia is his primary issue would recommend erythropoietin  to see if this helps to elevate his other blood counts as well. --No clear signs of nutritional deficiency during our prior evaluation. --MDS FISH study was normal.  --Started retacrit  20,000 units q 2 weeks on 12/28/2021.  PLAN: --Due for retacrit  injection today. Currently on 20,000 units q 2 weeks.  --Labs from today reviewed.  White blood cell 1.7, Hgb 10.4, MCV 76.8, Plt 34   --Will continue to monitor as long as platelet count is >20K. Could consider Promacta vs Nplate injections if Plt levels drop consistently <20  --Continue with q 2 week retacrit  injections as scheduled --RTC in 12 weeks time to assure he is tolerating the shots with continued q 2 week injections/labs   No orders of the defined types were placed in this encounter.   All questions were answered. The patient knows to call the clinic with any problems, questions or concerns.  A total of more than 30 minutes were spent on this encounter with face-to-face time and non-face-to-face time, including preparing to see the patient, ordering tests and/or medications, counseling the patient and coordination of care as outlined above.   Justin IVAR Kidney, MD Department of Hematology/Oncology [name] Phone: [phone] Pager: [phone] Email: [email or web address]  07/10/2024 10:43 AM "

## 2024-07-10 ENCOUNTER — Inpatient Hospital Stay

## 2024-07-10 ENCOUNTER — Inpatient Hospital Stay: Attending: Physician Assistant | Admitting: Hematology and Oncology

## 2024-07-10 VITALS — BP 157/88 | HR 72 | Temp 98.4°F | Resp 16 | Wt 144.2 lb

## 2024-07-10 DIAGNOSIS — D4620 Refractory anemia with excess of blasts, unspecified: Secondary | ICD-10-CM | POA: Insufficient documentation

## 2024-07-10 DIAGNOSIS — D61818 Other pancytopenia: Secondary | ICD-10-CM | POA: Diagnosis not present

## 2024-07-10 DIAGNOSIS — D469 Myelodysplastic syndrome, unspecified: Secondary | ICD-10-CM

## 2024-07-10 LAB — CMP (CANCER CENTER ONLY)
ALT: 8 U/L (ref 0–44)
AST: 27 U/L (ref 15–41)
Albumin: 4.4 g/dL (ref 3.5–5.0)
Alkaline Phosphatase: 65 U/L (ref 38–126)
Anion gap: 10 (ref 5–15)
BUN: 19 mg/dL (ref 8–23)
CO2: 22 mmol/L (ref 22–32)
Calcium: 9.2 mg/dL (ref 8.9–10.3)
Chloride: 106 mmol/L (ref 98–111)
Creatinine: 1.06 mg/dL (ref 0.61–1.24)
GFR, Estimated: >60 mL/min
Glucose, Bld: 94 mg/dL (ref 70–99)
Potassium: 4.2 mmol/L (ref 3.5–5.1)
Sodium: 139 mmol/L (ref 135–145)
Total Bilirubin: 0.6 mg/dL (ref 0.0–1.2)
Total Protein: 7.3 g/dL (ref 6.5–8.1)

## 2024-07-10 LAB — CBC WITH DIFFERENTIAL (CANCER CENTER ONLY)
Abs Immature Granulocytes: 0.01 K/uL (ref 0.00–0.07)
Basophils Absolute: 0 K/uL (ref 0.0–0.1)
Basophils Relative: 1 %
Eosinophils Absolute: 0 K/uL (ref 0.0–0.5)
Eosinophils Relative: 1 %
HCT: 32.8 % — ABNORMAL LOW (ref 39.0–52.0)
Hemoglobin: 10.4 g/dL — ABNORMAL LOW (ref 13.0–17.0)
Immature Granulocytes: 1 %
Lymphocytes Relative: 40 %
Lymphs Abs: 0.7 K/uL (ref 0.7–4.0)
MCH: 24.4 pg — ABNORMAL LOW (ref 26.0–34.0)
MCHC: 31.7 g/dL (ref 30.0–36.0)
MCV: 76.8 fL — ABNORMAL LOW (ref 80.0–100.0)
Monocytes Absolute: 0.5 K/uL (ref 0.1–1.0)
Monocytes Relative: 26 %
Neutro Abs: 0.5 K/uL — ABNORMAL LOW (ref 1.7–7.7)
Neutrophils Relative %: 31 %
Platelet Count: 34 K/uL — ABNORMAL LOW (ref 150–400)
RBC: 4.27 MIL/uL (ref 4.22–5.81)
RDW: 21.3 % — ABNORMAL HIGH (ref 11.5–15.5)
WBC Count: 1.7 K/uL — ABNORMAL LOW (ref 4.0–10.5)
nRBC: 0 % (ref 0.0–0.2)

## 2024-07-10 LAB — SAMPLE TO BLOOD BANK

## 2024-07-10 MED ORDER — EPOETIN ALFA 20000 UNIT/ML IJ SOLN
20000.0000 [IU] | Freq: Once | INTRAMUSCULAR | Status: AC
  Administered 2024-07-10: 20000 [IU] via SUBCUTANEOUS
  Filled 2024-07-10: qty 1

## 2024-07-13 ENCOUNTER — Encounter: Payer: Self-pay | Admitting: Hematology and Oncology

## 2024-07-14 ENCOUNTER — Ambulatory Visit: Admitting: Internal Medicine

## 2024-07-22 ENCOUNTER — Ambulatory Visit: Payer: PPO

## 2024-07-23 ENCOUNTER — Ambulatory Visit: Admitting: Internal Medicine

## 2024-07-24 ENCOUNTER — Inpatient Hospital Stay

## 2024-07-24 VITALS — BP 161/82 | HR 50 | Temp 98.5°F

## 2024-07-24 DIAGNOSIS — D4620 Refractory anemia with excess of blasts, unspecified: Secondary | ICD-10-CM | POA: Diagnosis not present

## 2024-07-24 DIAGNOSIS — D469 Myelodysplastic syndrome, unspecified: Secondary | ICD-10-CM

## 2024-07-24 LAB — SAMPLE TO BLOOD BANK

## 2024-07-24 LAB — CMP (CANCER CENTER ONLY)
ALT: 10 U/L (ref 0–44)
AST: 27 U/L (ref 15–41)
Albumin: 4.6 g/dL (ref 3.5–5.0)
Alkaline Phosphatase: 60 U/L (ref 38–126)
Anion gap: 12 (ref 5–15)
BUN: 19 mg/dL (ref 8–23)
CO2: 23 mmol/L (ref 22–32)
Calcium: 9.6 mg/dL (ref 8.9–10.3)
Chloride: 107 mmol/L (ref 98–111)
Creatinine: 1.07 mg/dL (ref 0.61–1.24)
GFR, Estimated: >60 mL/min
Glucose, Bld: 114 mg/dL — ABNORMAL HIGH (ref 70–99)
Potassium: 3.9 mmol/L (ref 3.5–5.1)
Sodium: 142 mmol/L (ref 135–145)
Total Bilirubin: 0.8 mg/dL (ref 0.0–1.2)
Total Protein: 7.5 g/dL (ref 6.5–8.1)

## 2024-07-24 LAB — CBC WITH DIFFERENTIAL (CANCER CENTER ONLY)
Abs Immature Granulocytes: 0 K/uL (ref 0.00–0.07)
Basophils Absolute: 0 K/uL (ref 0.0–0.1)
Basophils Relative: 0 %
Eosinophils Absolute: 0 K/uL (ref 0.0–0.5)
Eosinophils Relative: 0 %
HCT: 34 % — ABNORMAL LOW (ref 39.0–52.0)
Hemoglobin: 10.7 g/dL — ABNORMAL LOW (ref 13.0–17.0)
Immature Granulocytes: 0 %
Lymphocytes Relative: 39 %
Lymphs Abs: 0.9 K/uL (ref 0.7–4.0)
MCH: 24.3 pg — ABNORMAL LOW (ref 26.0–34.0)
MCHC: 31.5 g/dL (ref 30.0–36.0)
MCV: 77.3 fL — ABNORMAL LOW (ref 80.0–100.0)
Monocytes Absolute: 0.7 K/uL (ref 0.1–1.0)
Monocytes Relative: 30 %
Neutro Abs: 0.7 K/uL — ABNORMAL LOW (ref 1.7–7.7)
Neutrophils Relative %: 31 %
Platelet Count: 19 K/uL — ABNORMAL LOW (ref 150–400)
RBC: 4.4 MIL/uL (ref 4.22–5.81)
RDW: 20.4 % — ABNORMAL HIGH (ref 11.5–15.5)
WBC Count: 2.3 K/uL — ABNORMAL LOW (ref 4.0–10.5)
nRBC: 0 % (ref 0.0–0.2)

## 2024-07-24 MED ORDER — EPOETIN ALFA 20000 UNIT/ML IJ SOLN
20000.0000 [IU] | Freq: Once | INTRAMUSCULAR | Status: AC
  Administered 2024-07-24: 20000 [IU] via SUBCUTANEOUS
  Filled 2024-07-24: qty 1

## 2024-07-30 ENCOUNTER — Ambulatory Visit: Admitting: Physician Assistant

## 2024-07-31 ENCOUNTER — Ambulatory Visit: Admitting: Podiatry

## 2024-08-05 ENCOUNTER — Ambulatory Visit: Admitting: Internal Medicine

## 2024-08-07 ENCOUNTER — Inpatient Hospital Stay

## 2024-08-07 ENCOUNTER — Other Ambulatory Visit: Payer: Self-pay | Admitting: *Deleted

## 2024-08-07 ENCOUNTER — Inpatient Hospital Stay: Attending: Physician Assistant

## 2024-08-07 VITALS — BP 129/60 | HR 74 | Temp 97.6°F | Resp 18

## 2024-08-07 DIAGNOSIS — D469 Myelodysplastic syndrome, unspecified: Secondary | ICD-10-CM

## 2024-08-07 LAB — TYPE AND SCREEN
ABO/RH(D): O NEG
Antibody Screen: NEGATIVE

## 2024-08-07 LAB — CBC WITH DIFFERENTIAL (CANCER CENTER ONLY)
Abs Immature Granulocytes: 0.01 10*3/uL (ref 0.00–0.07)
Basophils Absolute: 0 10*3/uL (ref 0.0–0.1)
Basophils Relative: 1 %
Eosinophils Absolute: 0 10*3/uL (ref 0.0–0.5)
Eosinophils Relative: 0 %
HCT: 33.9 % — ABNORMAL LOW (ref 39.0–52.0)
Hemoglobin: 10.8 g/dL — ABNORMAL LOW (ref 13.0–17.0)
Immature Granulocytes: 0 %
Lymphocytes Relative: 45 %
Lymphs Abs: 1.2 10*3/uL (ref 0.7–4.0)
MCH: 24.7 pg — ABNORMAL LOW (ref 26.0–34.0)
MCHC: 31.9 g/dL (ref 30.0–36.0)
MCV: 77.6 fL — ABNORMAL LOW (ref 80.0–100.0)
Monocytes Absolute: 0.6 10*3/uL (ref 0.1–1.0)
Monocytes Relative: 22 %
Neutro Abs: 0.9 10*3/uL — ABNORMAL LOW (ref 1.7–7.7)
Neutrophils Relative %: 32 %
Platelet Count: 17 10*3/uL — ABNORMAL LOW (ref 150–400)
RBC: 4.37 MIL/uL (ref 4.22–5.81)
RDW: 18.6 % — ABNORMAL HIGH (ref 11.5–15.5)
WBC Count: 2.7 10*3/uL — ABNORMAL LOW (ref 4.0–10.5)
nRBC: 0 % (ref 0.0–0.2)

## 2024-08-07 LAB — CMP (CANCER CENTER ONLY)
ALT: <5 U/L (ref 0–44)
AST: 24 U/L (ref 15–41)
Albumin: 4.4 g/dL (ref 3.5–5.0)
Alkaline Phosphatase: 57 U/L (ref 38–126)
Anion gap: 14 (ref 5–15)
BUN: 19 mg/dL (ref 8–23)
CO2: 23 mmol/L (ref 22–32)
Calcium: 9.6 mg/dL (ref 8.9–10.3)
Chloride: 103 mmol/L (ref 98–111)
Creatinine: 1.12 mg/dL (ref 0.61–1.24)
GFR, Estimated: >60 mL/min
Glucose, Bld: 140 mg/dL — ABNORMAL HIGH (ref 70–99)
Potassium: 4 mmol/L (ref 3.5–5.1)
Sodium: 140 mmol/L (ref 135–145)
Total Bilirubin: 0.8 mg/dL (ref 0.0–1.2)
Total Protein: 7.1 g/dL (ref 6.5–8.1)

## 2024-08-07 LAB — SAMPLE TO BLOOD BANK

## 2024-08-07 MED ORDER — SODIUM CHLORIDE 0.9% IV SOLUTION
250.0000 mL | INTRAVENOUS | Status: DC
  Administered 2024-08-07: 100 mL via INTRAVENOUS

## 2024-08-07 MED ORDER — ACETAMINOPHEN 325 MG PO TABS
650.0000 mg | ORAL_TABLET | Freq: Once | ORAL | Status: AC
  Administered 2024-08-07: 650 mg via ORAL
  Filled 2024-08-07: qty 2

## 2024-08-07 MED ORDER — EPOETIN ALFA 20000 UNIT/ML IJ SOLN
20000.0000 [IU] | Freq: Once | INTRAMUSCULAR | Status: AC
  Administered 2024-08-07: 20000 [IU] via SUBCUTANEOUS
  Filled 2024-08-07: qty 1

## 2024-08-07 NOTE — Patient Instructions (Signed)
 Getting Platelets Through an IV (Platelet Transfusion) in Adults: What to Know After After getting platelets through an IV (platelet transfusion), you might have some bruising and soreness where the IV was placed. Follow these instructions at home: IV site care  Take care of your IV site as told. Make sure you: Keep the bandage clean and dry. Remove the bandage as told. You may be told to take it off after a few hours. After removing the bandage, gently wash the site with soap and water. Pat it dry. Check the site every day for signs of infection. Check for: Redness, swelling, or pain. Warmth. Pus or a bad smell. Fluid or blood. If fluid or blood comes out, press down firmly on the area with a clean bandage for a few minutes to stop the bleeding. General instructions Rest as told. Take your medicines only as told. Ask what things are safe for you to do at home. Ask when you can go back to work or school. Keep all follow-up visits. You may need lab tests to check your platelet counts. Contact a health care provider if: You have a fever or chills. You have itching or red, swollen areas of skin (hives). You have a headache that doesn't go away when you take medicine. You throw up or feel like you may throw up. You feel very tired or weaker than usual. You have signs of infection at your IV site. You have bleeding from the IV site and it doesn't stop with pressure. If you received your platelet transfusion in an outpatient setting, you'll be told who to contact if you have any reactions. Get help right away if: You have symptoms of a serious allergic or immune system reaction, including: Trouble breathing. Rash all over the body. Swelling of the face and feeling flushed. Dark pee or blood in the pee. A fast heartbeat. Pain in your back, belly, or chest. These symptoms may be an emergency. Call 911 right away. Do not wait to see if the symptoms will go away. Do not drive yourself to  the hospital. This information is not intended to replace advice given to you by your health care provider. Make sure you discuss any questions you have with your health care provider. Document Revised: 10/05/2023 Document Reviewed: 10/05/2023 Elsevier Patient Education  2025 [employer].

## 2024-08-07 NOTE — Progress Notes (Signed)
 Patient is concerned about his platelet counts dropping. Plts 17 today, down from 19 last visit. Patient wants to know if he needs a transfusion for this. MD, RN notified. MD ordered 1 unit of platelets to be transfused. RN Charge aware. Patient also notified, waiting in blue room for infusion appt.

## 2024-08-08 LAB — PREPARE PLATELET PHERESIS: Unit division: 0

## 2024-08-08 LAB — BPAM PLATELET PHERESIS
Blood Product Expiration Date: 202602052359
ISSUE DATE / TIME: 202602051049
Unit Type and Rh: 5100

## 2024-08-21 ENCOUNTER — Inpatient Hospital Stay

## 2024-08-25 ENCOUNTER — Ambulatory Visit: Admitting: Physician Assistant

## 2024-08-29 ENCOUNTER — Ambulatory Visit: Admitting: Internal Medicine

## 2024-09-01 ENCOUNTER — Ambulatory Visit: Admitting: Podiatry

## 2024-09-04 ENCOUNTER — Inpatient Hospital Stay

## 2024-09-04 ENCOUNTER — Inpatient Hospital Stay: Attending: Physician Assistant

## 2024-09-18 ENCOUNTER — Inpatient Hospital Stay

## 2024-10-02 ENCOUNTER — Inpatient Hospital Stay: Attending: Physician Assistant

## 2024-10-02 ENCOUNTER — Inpatient Hospital Stay: Admitting: Hematology and Oncology

## 2024-10-02 ENCOUNTER — Inpatient Hospital Stay

## 2024-10-16 ENCOUNTER — Inpatient Hospital Stay
